# Patient Record
Sex: Male | Born: 1965
Health system: Southern US, Community
[De-identification: ages and names within clinical notes are randomized; demographics above are authoritative.]

## PROBLEM LIST (undated history)

## (undated) DIAGNOSIS — I10 Essential (primary) hypertension: Secondary | ICD-10-CM

## (undated) DIAGNOSIS — K859 Acute pancreatitis without necrosis or infection, unspecified: Secondary | ICD-10-CM

## (undated) DIAGNOSIS — D649 Anemia, unspecified: Secondary | ICD-10-CM

## (undated) HISTORY — DX: Essential (primary) hypertension: I10

---

## 2000-01-29 ENCOUNTER — Inpatient Hospital Stay (HOSPITAL_COMMUNITY): Admission: EM | Admit: 2000-01-29 | Discharge: 2000-01-31 | Payer: Self-pay

## 2000-01-29 ENCOUNTER — Encounter: Payer: Self-pay | Admitting: Family Medicine

## 2004-05-31 ENCOUNTER — Emergency Department (HOSPITAL_COMMUNITY): Admission: EM | Admit: 2004-05-31 | Discharge: 2004-05-31 | Payer: Self-pay | Admitting: Emergency Medicine

## 2005-05-27 ENCOUNTER — Inpatient Hospital Stay (HOSPITAL_COMMUNITY): Admission: EM | Admit: 2005-05-27 | Discharge: 2005-06-02 | Payer: Self-pay | Admitting: Emergency Medicine

## 2005-05-28 ENCOUNTER — Ambulatory Visit: Payer: Self-pay | Admitting: Internal Medicine

## 2005-06-07 ENCOUNTER — Ambulatory Visit: Payer: Self-pay | Admitting: Internal Medicine

## 2006-03-26 ENCOUNTER — Emergency Department (HOSPITAL_COMMUNITY): Admission: EM | Admit: 2006-03-26 | Discharge: 2006-03-27 | Payer: Self-pay | Admitting: Emergency Medicine

## 2006-03-27 ENCOUNTER — Ambulatory Visit: Payer: Self-pay | Admitting: Cardiology

## 2006-03-27 ENCOUNTER — Inpatient Hospital Stay (HOSPITAL_COMMUNITY): Admission: EM | Admit: 2006-03-27 | Discharge: 2006-04-01 | Payer: Self-pay | Admitting: Emergency Medicine

## 2006-04-11 ENCOUNTER — Ambulatory Visit: Payer: Self-pay | Admitting: Gastroenterology

## 2006-06-15 ENCOUNTER — Inpatient Hospital Stay (HOSPITAL_COMMUNITY): Admission: EM | Admit: 2006-06-15 | Discharge: 2006-06-20 | Payer: Self-pay | Admitting: Emergency Medicine

## 2007-10-28 ENCOUNTER — Emergency Department (HOSPITAL_COMMUNITY): Admission: EM | Admit: 2007-10-28 | Discharge: 2007-10-28 | Payer: Self-pay | Admitting: Emergency Medicine

## 2009-07-30 ENCOUNTER — Emergency Department (HOSPITAL_COMMUNITY): Admission: EM | Admit: 2009-07-30 | Discharge: 2009-07-30 | Payer: Self-pay | Admitting: Emergency Medicine

## 2010-11-07 LAB — RAPID STREP SCREEN (MED CTR MEBANE ONLY): Streptococcus, Group A Screen (Direct): NEGATIVE

## 2010-12-08 ENCOUNTER — Emergency Department (HOSPITAL_COMMUNITY): Payer: Self-pay

## 2010-12-08 ENCOUNTER — Inpatient Hospital Stay (HOSPITAL_COMMUNITY)
Admission: EM | Admit: 2010-12-08 | Discharge: 2010-12-14 | DRG: 438 | Disposition: A | Discharge status: Home / Self Care | Payer: Self-pay | Attending: Internal Medicine | Admitting: Internal Medicine

## 2010-12-08 DIAGNOSIS — IMO0001 Reserved for inherently not codable concepts without codable children: Secondary | ICD-10-CM | POA: Diagnosis present

## 2010-12-08 DIAGNOSIS — K831 Obstruction of bile duct: Secondary | ICD-10-CM | POA: Diagnosis present

## 2010-12-08 DIAGNOSIS — R17 Unspecified jaundice: Secondary | ICD-10-CM | POA: Diagnosis present

## 2010-12-08 DIAGNOSIS — R634 Abnormal weight loss: Secondary | ICD-10-CM | POA: Diagnosis present

## 2010-12-08 DIAGNOSIS — R7401 Elevation of levels of liver transaminase levels: Secondary | ICD-10-CM | POA: Diagnosis present

## 2010-12-08 DIAGNOSIS — R978 Other abnormal tumor markers: Secondary | ICD-10-CM | POA: Diagnosis present

## 2010-12-08 DIAGNOSIS — R1013 Epigastric pain: Secondary | ICD-10-CM | POA: Diagnosis present

## 2010-12-08 DIAGNOSIS — R7402 Elevation of levels of lactic acid dehydrogenase (LDH): Secondary | ICD-10-CM | POA: Diagnosis present

## 2010-12-08 DIAGNOSIS — F17200 Nicotine dependence, unspecified, uncomplicated: Secondary | ICD-10-CM | POA: Diagnosis present

## 2010-12-08 DIAGNOSIS — K861 Other chronic pancreatitis: Secondary | ICD-10-CM | POA: Diagnosis present

## 2010-12-08 DIAGNOSIS — K859 Acute pancreatitis without necrosis or infection, unspecified: Principal | ICD-10-CM | POA: Diagnosis present

## 2010-12-08 DIAGNOSIS — R109 Unspecified abdominal pain: Secondary | ICD-10-CM | POA: Diagnosis present

## 2010-12-08 DIAGNOSIS — F1410 Cocaine abuse, uncomplicated: Secondary | ICD-10-CM | POA: Diagnosis present

## 2010-12-08 DIAGNOSIS — K838 Other specified diseases of biliary tract: Secondary | ICD-10-CM | POA: Diagnosis present

## 2010-12-08 DIAGNOSIS — R197 Diarrhea, unspecified: Secondary | ICD-10-CM | POA: Diagnosis present

## 2010-12-08 DIAGNOSIS — K7689 Other specified diseases of liver: Secondary | ICD-10-CM | POA: Diagnosis present

## 2010-12-08 HISTORY — DX: Acute pancreatitis without necrosis or infection, unspecified: K85.90

## 2010-12-08 LAB — COMPREHENSIVE METABOLIC PANEL
BUN: 10 mg/dL (ref 6–23)
CO2: 25 mEq/L (ref 19–32)
Calcium: 9.7 mg/dL (ref 8.4–10.5)
Chloride: 98 mEq/L (ref 96–112)
Creatinine, Ser: 0.58 mg/dL (ref 0.4–1.5)
GFR calc non Af Amer: >60 mL/min (ref >60)
Glucose, Bld: 341 mg/dL — ABNORMAL HIGH (ref 70–99)
Total Bilirubin: 7.3 mg/dL — ABNORMAL HIGH (ref 0.3–1.2)

## 2010-12-08 LAB — URINALYSIS, ROUTINE W REFLEX MICROSCOPIC
Nitrite: NEGATIVE
Protein, ur: NEGATIVE mg/dL
Specific Gravity, Urine: 1.044 — ABNORMAL HIGH (ref 1.005–1.030)
Urobilinogen, UA: 0.2 mg/dL (ref 0.0–1.0)

## 2010-12-08 LAB — CBC
MCH: 28.9 pg (ref 26.0–34.0)
MCHC: 33.4 g/dL (ref 30.0–36.0)
MCV: 86.4 fL (ref 78.0–100.0)
Platelets: 199 10*3/uL (ref 150–400)
RDW: 14.8 % (ref 11.5–15.5)
WBC: 8.7 10*3/uL (ref 4.0–10.5)

## 2010-12-08 LAB — DIFFERENTIAL
Basophils Relative: 1 % (ref 0–1)
Eosinophils Absolute: 0.2 10*3/uL (ref 0.0–0.7)
Lymphs Abs: 1.8 10*3/uL (ref 0.7–4.0)
Monocytes Absolute: 0.4 10*3/uL (ref 0.1–1.0)
Neutro Abs: 6.2 10*3/uL (ref 1.7–7.7)
Neutrophils Relative %: 71 % (ref 43–77)

## 2010-12-08 LAB — GLUCOSE, CAPILLARY

## 2010-12-08 LAB — LIPASE, BLOOD: Lipase: 174 U/L — ABNORMAL HIGH (ref 11–59)

## 2010-12-09 ENCOUNTER — Inpatient Hospital Stay (HOSPITAL_COMMUNITY): Payer: Self-pay

## 2010-12-09 ENCOUNTER — Encounter (HOSPITAL_COMMUNITY): Payer: Self-pay | Admitting: Radiology

## 2010-12-09 LAB — HEMOGLOBIN A1C
Hgb A1c MFr Bld: 14.7 % — ABNORMAL HIGH (ref <5.7)
Mean Plasma Glucose: 375 mg/dL — ABNORMAL HIGH (ref <117)

## 2010-12-09 LAB — PROTIME-INR: Prothrombin Time: 13.1 seconds (ref 11.6–15.2)

## 2010-12-09 LAB — GLUCOSE, CAPILLARY
Glucose-Capillary: 91 mg/dL (ref 70–99)
Glucose-Capillary: 103 mg/dL — ABNORMAL HIGH (ref 70–99)
Glucose-Capillary: 160 mg/dL — ABNORMAL HIGH (ref 70–99)
Glucose-Capillary: 266 mg/dL — ABNORMAL HIGH (ref 70–99)

## 2010-12-09 LAB — LIPASE, BLOOD: Lipase: 115 U/L — ABNORMAL HIGH (ref 11–59)

## 2010-12-09 LAB — LIPID PANEL
Cholesterol: 130 mg/dL (ref 0–200)
LDL Cholesterol: 75 mg/dL (ref 0–99)
Triglycerides: 69 mg/dL (ref <150)

## 2010-12-09 LAB — HEPATIC FUNCTION PANEL
ALT: 139 U/L — ABNORMAL HIGH (ref 0–53)
AST: 135 U/L — ABNORMAL HIGH (ref 0–37)
Albumin: 2.6 g/dL — ABNORMAL LOW (ref 3.5–5.2)

## 2010-12-09 LAB — RAPID URINE DRUG SCREEN, HOSP PERFORMED
Amphetamines: NOT DETECTED
Barbiturates: NOT DETECTED
Benzodiazepines: NOT DETECTED
Opiates: NOT DETECTED

## 2010-12-09 LAB — APTT: aPTT: 35 seconds (ref 24–37)

## 2010-12-09 MED ORDER — IOHEXOL 300 MG/ML  SOLN
100.0000 mL | Freq: Once | INTRAMUSCULAR | Status: AC | PRN
  Administered 2010-12-09: 100 mL via INTRAVENOUS

## 2010-12-10 LAB — COMPREHENSIVE METABOLIC PANEL
AST: 123 U/L — ABNORMAL HIGH (ref 0–37)
Albumin: 2.4 g/dL — ABNORMAL LOW (ref 3.5–5.2)
BUN: 4 mg/dL — ABNORMAL LOW (ref 6–23)
Calcium: 8 mg/dL — ABNORMAL LOW (ref 8.4–10.5)
Creatinine, Ser: <0.47 mg/dL (ref 0.4–1.5)
Total Bilirubin: 4 mg/dL — ABNORMAL HIGH (ref 0.3–1.2)
Total Protein: 4.9 g/dL — ABNORMAL LOW (ref 6.0–8.3)

## 2010-12-10 LAB — GLUCOSE, CAPILLARY
Glucose-Capillary: 262 mg/dL — ABNORMAL HIGH (ref 70–99)
Glucose-Capillary: 140 mg/dL — ABNORMAL HIGH (ref 70–99)

## 2010-12-10 LAB — HEMOGLOBIN A1C: Hgb A1c MFr Bld: 14.3 % — ABNORMAL HIGH (ref <5.7)

## 2010-12-11 LAB — COMPREHENSIVE METABOLIC PANEL
AST: 222 U/L — ABNORMAL HIGH (ref 0–37)
Albumin: 2.6 g/dL — ABNORMAL LOW (ref 3.5–5.2)
Alkaline Phosphatase: 738 U/L — ABNORMAL HIGH (ref 39–117)
BUN: 5 mg/dL — ABNORMAL LOW (ref 6–23)
Potassium: 3.9 mEq/L (ref 3.5–5.1)
Sodium: 136 mEq/L (ref 135–145)
Total Protein: 5.3 g/dL — ABNORMAL LOW (ref 6.0–8.3)

## 2010-12-11 LAB — MAGNESIUM: Magnesium: 1.9 mg/dL (ref 1.5–2.5)

## 2010-12-11 LAB — CBC
Hemoglobin: 11.4 g/dL — ABNORMAL LOW (ref 13.0–17.0)
MCV: 86.4 fL (ref 78.0–100.0)
Platelets: 178 10*3/uL (ref 150–400)
RBC: 3.96 MIL/uL — ABNORMAL LOW (ref 4.22–5.81)
WBC: 6.5 10*3/uL (ref 4.0–10.5)

## 2010-12-11 LAB — GLUCOSE, CAPILLARY: Glucose-Capillary: 282 mg/dL — ABNORMAL HIGH (ref 70–99)

## 2010-12-12 LAB — GLUCOSE, CAPILLARY
Glucose-Capillary: 100 mg/dL — ABNORMAL HIGH (ref 70–99)
Glucose-Capillary: 200 mg/dL — ABNORMAL HIGH (ref 70–99)

## 2010-12-12 LAB — COMPREHENSIVE METABOLIC PANEL
Alkaline Phosphatase: 858 U/L — ABNORMAL HIGH (ref 39–117)
BUN: 5 mg/dL — ABNORMAL LOW (ref 6–23)
Chloride: 105 mEq/L (ref 96–112)
Glucose, Bld: 138 mg/dL — ABNORMAL HIGH (ref 70–99)
Potassium: 3.5 mEq/L (ref 3.5–5.1)
Total Bilirubin: 4.6 mg/dL — ABNORMAL HIGH (ref 0.3–1.2)

## 2010-12-12 LAB — CBC
HCT: 34.4 % — ABNORMAL LOW (ref 39.0–52.0)
MCV: 86.4 fL (ref 78.0–100.0)
RBC: 3.98 MIL/uL — ABNORMAL LOW (ref 4.22–5.81)
WBC: 6.3 10*3/uL (ref 4.0–10.5)

## 2010-12-12 LAB — LIPASE, BLOOD: Lipase: 116 U/L — ABNORMAL HIGH (ref 11–59)

## 2010-12-13 ENCOUNTER — Other Ambulatory Visit: Payer: Self-pay | Admitting: Gastroenterology

## 2010-12-13 ENCOUNTER — Inpatient Hospital Stay (HOSPITAL_COMMUNITY): Payer: Self-pay

## 2010-12-13 LAB — COMPREHENSIVE METABOLIC PANEL
ALT: 225 U/L — ABNORMAL HIGH (ref 0–53)
AST: 201 U/L — ABNORMAL HIGH (ref 0–37)
Alkaline Phosphatase: 890 U/L — ABNORMAL HIGH (ref 39–117)
CO2: 26 mEq/L (ref 19–32)
Glucose, Bld: 272 mg/dL — ABNORMAL HIGH (ref 70–99)
Potassium: 3.7 mEq/L (ref 3.5–5.1)
Sodium: 135 mEq/L (ref 135–145)
Total Protein: 5.1 g/dL — ABNORMAL LOW (ref 6.0–8.3)

## 2010-12-13 LAB — CBC
HCT: 33.2 % — ABNORMAL LOW (ref 39.0–52.0)
Hemoglobin: 10.9 g/dL — ABNORMAL LOW (ref 13.0–17.0)
RDW: 15.5 % (ref 11.5–15.5)
WBC: 6.1 10*3/uL (ref 4.0–10.5)

## 2010-12-13 LAB — GLUCOSE, CAPILLARY: Glucose-Capillary: 130 mg/dL — ABNORMAL HIGH (ref 70–99)

## 2010-12-13 LAB — LIPASE, BLOOD: Lipase: 100 U/L — ABNORMAL HIGH (ref 11–59)

## 2010-12-14 LAB — COMPREHENSIVE METABOLIC PANEL
ALT: 173 U/L — ABNORMAL HIGH (ref 0–53)
BUN: 4 mg/dL — ABNORMAL LOW (ref 6–23)
Calcium: 8.3 mg/dL — ABNORMAL LOW (ref 8.4–10.5)
Creatinine, Ser: <0.47 mg/dL (ref 0.4–1.5)
Glucose, Bld: 227 mg/dL — ABNORMAL HIGH (ref 70–99)
Sodium: 133 mEq/L — ABNORMAL LOW (ref 135–145)
Total Bilirubin: 2.6 mg/dL — ABNORMAL HIGH (ref 0.3–1.2)
Total Protein: 5.2 g/dL — ABNORMAL LOW (ref 6.0–8.3)

## 2010-12-14 LAB — CBC
HCT: 35.1 % — ABNORMAL LOW (ref 39.0–52.0)
RBC: 4 MIL/uL — ABNORMAL LOW (ref 4.22–5.81)
RDW: 15.2 % (ref 11.5–15.5)
WBC: 8.3 10*3/uL (ref 4.0–10.5)

## 2010-12-14 LAB — GLUCOSE, CAPILLARY
Glucose-Capillary: 232 mg/dL — ABNORMAL HIGH (ref 70–99)
Glucose-Capillary: 228 mg/dL — ABNORMAL HIGH (ref 70–99)

## 2010-12-17 LAB — TYPE AND SCREEN
DAT, IgG: NEGATIVE
Donor AG Type: NEGATIVE
Donor AG Type: NEGATIVE
Unit division: 0

## 2010-12-19 NOTE — Group Therapy Note (Signed)
NAME:  Rodney Barber, Rodney Barber            ACCOUNT NO.:  [id number]  MEDICAL RECORD NO.:  [id number]           PATIENT TYPE:  I  LOCATION:  1503                         FACILITY:  [facility]  PHYSICIAN:  Kela Millin, M.D.DATE OF BIRTH:  December 03, 1965                                PROGRESS NOTE   CURRENT DIAGNOSES: 1. Acute-on-chronic pancreatitis. 2. Common bile duct strictures - status post ERCP today with     sphincterotomy x2 and stent placement per Dr. Ewing Schlein,     biopsies/pathology results pending. 3. Elevated liver function tests. 4. Elevated CA-19-9 - per GI, the patient to have followup recheck     test when LFTs have improved. 5. Left hepatic lobe lesion with increased intrahepatic and     extrahepatic biliary dilatation - per CT scan of abdomen and     pelvis. 6. Type 2 diabetes mellitus, uncontrolled - new onset.  PROCEDURES AND STUDIES: 1. Chest x-ray on May 3rd - no active lung disease. 2. Abdominal x-rays on May 3rd - gaseous distention of small bowel.     Cannot exclude partial small bowel.  Moderate amount of feces     throughout the colon. 3. Abdominal ultrasound on May 3rd - biliary sludge in the gallbladder     and the common bile duct with associated dilatation of the common     bile duct to 11 mm.  Correlation suggested with liver function     tests.  Evidence of chronic pancreatitis.  Somewhat  echogenic     kidneys. 4. CT scan of abdomen and pelvis on May 4th - newly apparent atrophy,     enhancing lesion in the lateral segment of the left hepatic lobe.     Dysplastic nodule/filling of a hemangioma or focal nodular     hyperplasia, but hepatic cellular carcinoma not excluded.     Increased intrahepatic and extrahepatic biliary dilatation.  There     are dorsal pancreatic duct stones as well as a potential stone in     the ampulla.  Increased dilatation of the dorsal pancreatic duct.     Small suspected pseudocysts adjacent to the pancreatic head.     Diffuse  mesenteric edema - question active pancreatitis.     Nonspecific hypodense lesion in the lateral limb of the left     adrenal gland.  Atherosclerosis.  Mild wall thickening in the     ascending colon which could be from inflammation or possibly     hypoproteinemia. 5. ERCP with sphincterotomy x2 per Dr. Ewing Schlein today, May 8th, - 3-4 cm     common bile duct strictures, status post brushing and stent     placement, sphincterotomy x2.  To follow up on brushings/pathology     and if negative endoscopic ultrasound next week per pathology.  CONSULTATIONS: 1. Gastroenterology - Willis Modena, MD/Marc Shanon Ace, MD 2. General Surgery - Ardeth Sportsman, MD/Bartlett H. Ezzard Standing, MD.  BRIEF HISTORY: The patient is a 45 year old black male with the above-listed medical problems who presented with complaints of abdominal pain and diarrhea for 3 weeks.  It was noted that he does have a history of  chronic pancreatitis and reported mild-to-moderate right upper quadrant and epigastric pain for about 3 weeks, associated with loose bowel movements.  He denied any nausea or vomiting.  He also reported about 15- pound weight loss in 4 weeks.  He describes his abdominal pain as mild to moderate and stated that his p.o. intake was decreased and that he was avoiding solids because of the pain and just managing liquids prior to admission.  He was seen in the ED and admitted for further evaluation and management.  HOSPITAL COURSE: 1. Acute-on-chronic pancreatitis - upon admission, his lab work     revealed a lipase of 1115 and amylase of 168.  He was kept n.p.o.     and hydrated with IV fluids and started on IV analgesics for pain     management.  Abdominal ultrasound was done and the results as     stated above as well as a CT scan of the abdomen and pelvis.     Following the abnormal CT scan, Gastroenterology was consulted as     well as General Surgery.  Dr. Dulce Sellar saw the patient and     recommended to  continue conservative management and he added     pancreatic enzymes in the patient's treatment regimen.  He also     obtained a CA-19-9 level and this came back markedly elevated at     22,039.  Dr. Dulce Sellar recommended doing an ERCP and Dr. Ewing Schlein     followed up with the patient and the ERCP was done today and     revealed common bile duct strictures and sphincterotomies x2 was     done and also a stent was placed.  The patient is to be observed     today for late complications and GI to follow for further     recommendations.  His lipase on recheck today is 100. 2. Common bile duct strictures - as discussed above, the patient had a     CT scan of the abdomen and pelvis on admission and the results are     as stated above.  A CEA level was done and came back at 4.5.  GI     was consulted and an ERCP was done today that revealed the common     bile duct strictures and sphincterotomies done x2 and a stent     placed.  LFTs/c-MET to follow up in the EMS.  Brushings/pathology     was sent following the ERCP and the results are pending at the time     of this dictation, per GI if these results are negative endoscopic     ultrasound will be planned for next week for further evaluation. 3. Elevated CA-19-9 - the patient had a CT scan of his abdomen and     pelvis which was abnormal, as stated above, and GI was consulted     and they saw the patient and CA-19-9 was ordered and came back     elevated at 22,039.  As stated above, ERCP was done today and the     pathology is pending and plan is to follow up and have endoscopic     ultrasound done if it comes back negative.  Per GI recommendations,     the patient is also to have repeat CA-19-9 once his LFTs decrease. 4. Elevated LFTs - as discussed above, the patient's LFTs on admission     showed a total bilirubin of 7.3, alkaline phosphatase of 1051,  SGOT     of 202, SGPT of 226 and abdominal ultrasound as well as CT scan of     abdomen and pelvis  were done and the results are as stated above.     GI and Surgery were consulted and have been following the patient.     As already mentioned, the ERCP revealed common bile duct strictures     and pathology is pending at this time and repeat c-MET is pending     for in the morning. 5. Diabetes mellitus - on admission, the patient was noted to have     elevated blood sugars of 341 without a prior history of diabetes     mellitus.  Hemoglobin A1c was done and came back elevated at 14.3.     The patient was started on Lantus during this hospital stay and     inpatient diabetes education done and the patient is also to be     scheduled for outpatient diabetes education.  He is currently on     Lantus with sliding scale coverage at this time. 6. Left hepatic lobe lesion - imaging studies as stated above, and     also the serologies with a CEA of 4.5 as above, per GI.  The rounding hospitalist to dictate the rest of the patient's hospital stay as well as final discharge medications and followup care.     Kela Millin, M.D.     ACV/MEDQ  D:  12/13/2010  T:  12/13/2010  Job:  [id number]  Electronically Signed by Donnalee Curry M.D. on 12/19/2010 12:13:10 PM

## 2010-12-20 NOTE — Consult Note (Signed)
NAME:  Rodney Barber, Rodney Barber            ACCOUNT NO.:  [id number]  MEDICAL RECORD NO.:  [id number]           PATIENT TYPE:  I  LOCATION:  1503                         FACILITY:  [facility]  PHYSICIAN:  Ardeth Sportsman, MD     DATE OF BIRTH:  September 24, 1965  DATE OF CONSULTATION:  12/09/2010 DATE OF DISCHARGE:                                CONSULTATION   REFERRING PHYSICIAN:  Dr. Suanne Marker.  PRIMARY CARE PHYSICIAN:  None.  REASON FOR CONSULTATION:  Sludge in the gallbladder, abdominal pain, and diarrhea.  BRIEF HISTORY:  The patient is a 45 year old African American male who has a history of recurrent pancreatitis who gave up alcohol 5 years ago. He reports abdominal pain over the last 3 weeks.  Pain is in the right upper quadrant.  He has also had 15-pound weight loss in the last 4 weeks.  He has not been trying to lose weight.  He has been eating at home.  He has been taking over-the-counter pain medications without resolution.  He ultimately presented to the ER here at [facility] and was admitted by the hospitalist service on Dec 08, 2010. Workup here shows elevated LFTs.  On admission, his lipase was 174. Alcohol was less than 11, normal.  Magnesium was 2.0.  Total bilirubin was 7.3, alkaline phosphatase was 1051, SGOT was 202, and SGPT was 226. Urine showed greater than 1000 mg glucose.  White count 8.7, hemoglobin 14, hematocrit 42, and platelets 199,000.  Prothrombin time was 13.1, INR was 0.97, and PTT was 35.  Drug screen was negative.  Total protein was 7 and albumin was 4.  Repeat labs today, shows the bilirubin down to 5.7, direct bilirubin 4.8, and indirect is 0.9.  Alkaline phosphatase 690, SGOT 135, and SGPT 139.  Amylase is 168 and lipase is 115.  Lipid profile shows total cholesterol of 130, triglyceride is 69, HDL 41, and LDL is 75.  Hemoglobin A1c is 14.7.  Flat plate abdomen on admission showed some gaseous distention in the small bowel and moderate amount of feces  in the colon.  Abdominal ultrasound showed the gallbladder contained sludge and was moderately distended.  There was no gallbladder wall thickening.  Murphy sign was negative.  Common bile duct is dilated and measures up to 11 mm.  There is visible biliary sludge present in the common distal bile duct.  Liver showed no evidence of mass or significant intrahepatic biliary dilatation.  The IVC was patent.  The pancreas was visualized, which showed punctate calcifications consistent with chronic pancreatitis.  Spleen was normal.  Kidneys were normal. Abdominal aorta was normal.  CT scan obtained today at 2 o'clock, shows the lateral left lobe has a new area that could be hemangioma, hyperplasia, or small hepatocellular carcinoma.  There was increased intrahepatic and extrahepatic biliary dilatation.  The dorsal pancreatic duct stones are present and there are also stones in the ampulla.  There is increased dilatation of the dorsal pancreatic duct.  There is a question of a small pseudocyst adjacent to the pancreatic head.  There is diffuse mesenteric edema.  There is a hypodense lesion, left adrenal. We  are asked to see the patient in consultation.  PAST MEDICAL HISTORY: 1. Chronic pancreatitis.  Last hospitalization, August 05, 2006.  At     that time, he had a retroperitoneal bleed. 2. He has a history of alcohol use, tobacco, and cocaine use.  PAST SURGICAL HISTORY:  None.  FAMILY HISTORY:  Father died of a stroke and hypertension.  Mother is living with diabetes, has 3 brothers, 1 died in accident and 1 sister in good health.  The remaining 2 brothers are in good health as far as he knows.  SOCIAL HISTORY:  He smoked 25 to 30 years, less than a pack a day.  He currently smokes about 3 packs per week.  Alcohol, he reports heavy use for 20 or more years.  He quit 5 years ago after his last episode of pancreatitis.  Drugs:  He used cocaine for number of years.  He denies any use for  the last 2 years.  He lives at home with his mother.  He is unemployed currently.  REVIEW OF SYSTEMS:  FEVER:  None.  WEIGHT:  15-pound weight loss.  He also complains of generalized weakness.  He reports has been eating normally.  SKIN:  No changes.  PSYCHIATRIC:  No new changes.  PULMONARY: No dyspnea on exertion, orthopnea, or PND.  No coughing, no wheezing, no recent URI.  CARDIAC:  No chest pain, palpitations, or history of MI. CV:  Negative for headaches, dizziness, syncope, presyncope, stroke, or seizure.  GI:  Positive for GERD.  No nausea or vomiting.  Positive for diarrhea.  No constipation.  No blood in the stool.  GU:  He notes he is peeing normally, but the urine is very dark.  LOWER EXTREMITIES:  No edema or claudication.  MUSCULOSKELETAL:  No joint or hip changes.  CURRENT MEDICATIONS:  No prescription medications.  He has been going to the doctor where he get pain medications.  ALLERGIES:  None known.  PHYSICAL EXAMINATION:  GENERAL:  This is a thin cachectic African American male in no acute distress.  Currently, he is pain free. VITAL SIGNS:  Last vital signs shows temperature 98.3, heart rate 54, respiratory rate 16, saturation is 99%, and blood pressure 110/77. HEAD:  Normocephalic. EYES, EARS, NOSE, AND THROAT:  Normal. NECK:  Trachea is in the midline.  No bruits.  No JVD.  Thyroid is nonpalpable.  No bruits. CARDIAC:  Normal S1 and S2.  No murmurs or rubs.  Pulses are +2 and equal. CHEST:  Nontender. ABDOMEN:  Positive for bowel sounds.  Abdomen looks mildly distended, but it is nontender.  The liver is percussed at about 1.5 to 2 cm below the costal margin.  No masses, abscesses, or hernias noted. GU/RECTAL:  Deferred. LYMPHATIC:  Lymphadenopathy, none palpated. MUSCULOSKELETAL:  No joint abnormalities. SKIN:  No changes. NEUROLOGIC:  Cranial nerves are grossly intact.  No focal deficits. PSYCHIATRIC:  Normal affect.  IMPRESSION: 1. Recurrent  pancreatitis with new weight loss, off alcohol. 2. Newly distended gallbladder with sludge, not present on ultrasound     of June 15, 2006. 3. Biliary dilatation, intrahepatic with sludge in the common bile     duct.  There is also pancreatic stones noted. 4. A 1.4 cm lesion, left hepatic lobe.  Differential currently being     nodular hyperplasia, hemangioma, or possible carcinoma. 5. Mesenteric edema secondary to be probable pancreatitis. 6. Left adrenal lesion, nonspecific. 7. New diagnosis of diabetes with hemoglobin A1c of 14.7.  PLAN:  The patient has been seen by GI.  They are screening for diarrhea, checking CEA, following his LFTs.  Note that he may need an ERCP and an EUS.  There is also question of possible MRCP.  Dr. Michaell Cowing will review and see the patient shortly.     Eber Hong, P.A.   ______________________________ Ardeth Sportsman, MD    WDJ/MEDQ  D:  12/09/2010  T:  12/09/2010  Job:  [id number]  cc:   Eber Hong, P.A.  Willis Modena, MD Fax: [phone]  Electronically Signed by Sherrie George P.A. on 12/20/2010 11:18:08 AM Electronically Signed by Karie Soda MD on 12/20/2010 12:23:08 PM

## 2010-12-21 ENCOUNTER — Ambulatory Visit (HOSPITAL_COMMUNITY)
Admission: RE | Admit: 2010-12-21 | Discharge: 2010-12-21 | Disposition: A | Discharge status: Home / Self Care | Payer: Self-pay | Source: Ambulatory Visit | Attending: Gastroenterology | Admitting: Gastroenterology

## 2010-12-21 ENCOUNTER — Other Ambulatory Visit: Payer: Self-pay | Admitting: Gastroenterology

## 2010-12-21 DIAGNOSIS — K831 Obstruction of bile duct: Secondary | ICD-10-CM | POA: Insufficient documentation

## 2010-12-21 DIAGNOSIS — K861 Other chronic pancreatitis: Secondary | ICD-10-CM | POA: Insufficient documentation

## 2010-12-21 DIAGNOSIS — Z01812 Encounter for preprocedural laboratory examination: Secondary | ICD-10-CM | POA: Insufficient documentation

## 2010-12-21 DIAGNOSIS — E119 Type 2 diabetes mellitus without complications: Secondary | ICD-10-CM | POA: Insufficient documentation

## 2010-12-21 LAB — CBC
MCH: 29.1 pg (ref 26.0–34.0)
MCHC: 32.9 g/dL (ref 30.0–36.0)
Platelets: 296 10*3/uL (ref 150–400)

## 2010-12-21 LAB — COMPREHENSIVE METABOLIC PANEL
AST: 20 U/L (ref 0–37)
Albumin: 3.7 g/dL (ref 3.5–5.2)
Calcium: 8.8 mg/dL (ref 8.4–10.5)
Creatinine, Ser: 0.57 mg/dL (ref 0.4–1.5)
GFR calc Af Amer: >60 mL/min (ref >60)
GFR calc non Af Amer: >60 mL/min (ref >60)

## 2010-12-22 LAB — CANCER ANTIGEN 19-9: CA 19-9: 13341.8 U/mL — ABNORMAL HIGH (ref <35.0)

## 2010-12-22 NOTE — Discharge Summary (Signed)
NAME:  Rodney Barber, Rodney Barber            ACCOUNT NO.:  [id number]  MEDICAL RECORD NO.:  [id number]           PATIENT TYPE:  I  LOCATION:  1503                         FACILITY:  [facility]  PHYSICIAN:  Thad Ranger, MD       DATE OF BIRTH:  [birth year]  DATE OF ADMISSION:  12/08/2010 DATE OF DISCHARGE:                        DISCHARGE SUMMARY - REFERRING   DISCHARGE DIAGNOSES: 1. Acute on chronic pancreatitis. 2. Common bile duct stricture status post endoscopic retrograde     cholangiopancreatography with sphincterotomy x2 and stent     placement.  Biopsies pending. 3. Transaminitis. 4. Elevated CA19-9. 5. Left hepatic lobe lesion with increased intrahepatic and     extrahepatic biliary dilatation. 6. Type 2 diabetes mellitus, new onset, uncontrolled.  CONSULTATIONS:  Gastroenterology, Dr. Vida Rigger and Dr. Herbie Drape. General Surgery, Dr. Karie Soda.  DISCHARGE MEDICATIONS: 1. Creon AC 3 capsules p.o. t.i.d. before meals. 2. Norco 5/325 mg tabs 1 tablet p.o. every 4 hours as needed for pain. 3. Insulin NovoLog 1 to 9 units subcutaneously t.i.d. with meals     sliding scale. 4. Lantus 10 units subcutaneous daily at bedtime. 5. Protonix 40 mg p.o. daily. 6. Tramadol 50 mg p.o. every 4 hours as needed for pain.  Please note that the progress note for the hospitalization course was dictated by Dr. Suanne Marker yesterday on Dec 13, 2010.  Please refer to the progress note for complete details of the hospitalization; briefly: 1. Acute on chronic pancreatitis with CBD strictures status post ERCP     with sphincterotomy, the biopsies are still pending.  The patient     has been started on diet and has been tolerating well since a.m.     today.  Per GI and Surgery recommendation if the patient is able to     tolerate lunch today he can be safely discharged to home.  The     patient will follow up with Dr. Vida Rigger in office within next 1     week for the biopsy results and further workup  outpatient.  The     patient was also cleared by General Surgery for discharge if he     tolerates diet. 2. Elevated CA19-9.  As dictated above ERCP has been done.  Pathology     is still pending and the patient is to follow up with     Gastroenterology for further workup. 3. Diabetes mellitus, new onset.  HbA1c was elevated at 14.3.  The     patient was given extensive diabetic education and was started on     Lantus and sliding scale insulin while inpatient.  The patient will     be discharged to home today.  Discharge followup with [facility] Dr. Andrey Campanile on February 02, 2011, at 2:15 p.m.; Dr. Vida Rigger, the patient is to call for biopsy results and followup appointment within next 1 week.  This was clearly explained to the patient in great detail; Surgery, Dr. Karie Soda, within next 2 to 4 weeks.  PHYSICAL EXAMINATION:  VITAL SIGNS:  Temperature 98.5, pulse 53, respirations 16, BP 145/82, O2 sats 95% on room air. GENERAL:  The  patient is alert, awake and oriented x3, not in acute distress. HEENT:  Anicteric sclerae.  Pink conjunctivae.  Scleral icterus noted. CVS:  S1, S2 clear. ABDOMEN:  Soft, normal bowel sounds. EXTREMITIES:  No pedal edema. NEUROLOGICAL:  Alert and oriented x3.  No focal neurological deficits noted.Marland Kitchen  Discharge time, 35 minutes.     Thad Ranger, MD     RR/MEDQ  D:  12/14/2010  T:  12/14/2010  Job:  [id number]  cc:   Petra Kuba, M.D. Fax: [phone]  Ardeth Sportsman, MD [address] [place] [address]  Maurice March, M.D. Fax: [phone]  Electronically Signed by Freeda Spivey  on 12/22/2010 01:27:16 PM

## 2010-12-23 NOTE — Discharge Summary (Signed)
NAME:  Rodney Barber, Rodney Barber NO.:  [id number]   MEDICAL RECORD NO.:  [id number]          PATIENT TYPE:  INP   LOCATION:  1430                         [facility]AC[facility]   PHYSICIAN:  Michaelyn Barter, M.D. DATE OF BIRTH:  August 04, 1966   DATE OF ADMISSION:  06/15/2006  DATE OF DISCHARGE:  06/20/2006                               DISCHARGE SUMMARY   PRIMARY CARE PHYSICIAN:  Unassigned.   FINAL DIAGNOSES:  1. Acute on chronic pancreatitis.  2. Acute blood loss anemia.  3. Intra-abdominal hematoma.   CONSULTATIONS:  General surgery with Dr. Zachery Dakins.   PROCEDURES:  1. Transfusion of 2 units of packed RBCs.  2. Ultrasound of the patient's abdomen completed November9,2007.  3. CT scan of the abdomen with and without contrast November9,2007.  4. Angiogram of the patient's abdomen on November10,2007 done by Dr.      Charlett Nose.  5. CT scan of the patient's abdomen with and without contrast      completed November13,2007.   HISTORY OF PRESENT ILLNESS:  Rodney Barber is a 45 year old gentleman  with a past medical history of recurrent acute pancreatitis who  complained of a 2-day history of abdominal pain, nausea and vomiting.  His abdominal pain was located primarily in the epigastrium.  It  radiated to the lower abdomen and to his back.  The pain initially was  intermittent but became constant.  It was associated with nausea and  vomiting.  He complained of at least 1 episode of coffee-ground emesis.   PAST MEDICAL HISTORY:  Please see that dictated by Dr. Elliot Cousin on  November9,2007.   HOSPITAL COURSE:  #1 - ACUTE ON CHRONIC PANCREATITIS:  The patient's  lipase at the time of admission was found to be 1192.  He was started on  p.r.n. pain medications and over the course of his hospitalization his  abdominal pain resolved.  His lipase, by the following day, November10,  was 265.  On that same date, an amylase was completed, and it was found  to be 342.  An  ultrasound of the patient's abdomen was done on  November9.  It revealed heterogeneous retroperitoneal mass adjacent to  the pancreatic head, the etiology of which was questionable.  The  radiologist indicated that he was concerned that it could represent a  solid tumor mass and recommended that a CAT scan.  CT scan of the  patient's abdomen was done with and without contrast later on November9.  It revealed that the retroperitoneal mass seen on the recent ultrasound  represented a hematoma centered in the region of the duodenum and the  pancreatic head.  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  The radiologist  recommended that a mesenteric arteriogram be considered.  On November10,  a celiac arteriogram with superior mesenteric arteriogram was completed.  The final impression from that study was that there may be filling of  the tiny pseudoaneurysm on the SMA injection filling from very  tiny/wispy vessels from the pancreaticoduodenal arcade  was evident.  Even if this does represent a small pseudoaneurysm, it could not be  treated percutaneously due to his filling from very small and poorly  defined vessels.  General surgery was consulted.  Dr. Zachery Dakins followed  the patient over  his hospitalization.  He recommended that close  observation of the patient's hemoglobin and hematocrit take place.  He  also monitored the patient closely for any signs of additional blood  loss and for any progression of his abdominal pain.  The patient,  however, remained stable over the next few days of his hospitalization.  Therefore, no surgical intervention was needed.  A repeat CAT scan was  done on November13.  It revealed that the hematoma entered along the  posterolateral wall of the descending duodenum, is minimally smaller on  followup CT scan.  Pseudoaneurysm off  the pancreaticoduodenal arcade is  no longer appreciated.  The pancreatic ductal dilation likely secondary  to pancreatic duct stones within the head of the uncinate process was  seen.  The patient's abdominal pain had completely resolved by the date  of his discharge.  1. Anemia.  This was related to the hematoma that was seen on CAT      scan.  The patient arrived with a hemoglobin of 13.3 on November9;      however, by November11, the patient's hemoglobin drifted down to      8.8.  This was an acute loss of blood.  The patient received 2      units of packed RBCs.  His hemoglobin eventually stabilized during      the course of his hospitalization.  2. Hematoma.  Again, this was depicted on CAT scan, and it was found      to be located in a region of the duodenum and the pancreatic head      and was felt to possibly have been associated with pseudoaneurysm.      No surgical intervention was necessary during this hospitalization,      as the patient's hemoglobin stabilized, and his vitals remained      stable.   CONDITION AT THE TIME OF DISCHARGE:  On the date of discharge, the  patient stated that his abdominal pain had completely resolved.  He had  no abdominal pain.  His vitals:  His temperature was 98.7, heart rate  68, respirations 18.  The decision was made to discharge the patient  home.  The patient was discharged home on Protonix 40 mg daily and  lisinopril 10 mg once a day.  He was told to follow up with [facility],  to stop drinking alcohol, and to stop smoking cigarettes.      Michaelyn Barter, M.D.  Electronically Signed     OR/MEDQ  D:  07/09/2006  T:  07/10/2006  Job:  [id number]

## 2010-12-23 NOTE — H&P (Signed)
NAME:  Rodney Barber, Rodney Barber NO.:  [id number]   MEDICAL RECORD NO.:  [id number]          PATIENT TYPE:  INP   LOCATION:  1404                         FACILITY:  [facility]   PHYSICIAN:  Isidor Holts, M.D.  DATE OF BIRTH:  1965-08-22   DATE OF ADMISSION:  05/27/2005  DATE OF DISCHARGE:                                HISTORY & PHYSICAL   PRIMARY CARE PHYSICIAN:  Unassigned.   CHIEF COMPLAINT:  Abdominal pain, poor appetite for eight days.   HISTORY OF PRESENT ILLNESS:  This is a 45 year old male, with known history  of alcohol abuse. He drank 40 ounces of malt liquor on May 19, 2005. He  subsequently, on May 22, 2005, developed upper abdominal pain. This was  described as severe, constant and exacerbated by eating. He vomited for two  days thereafter. He has not been able to eat since and estimates that he has  lost about 20 pounds since his pain started. Denies fever, denies diarrhea,  denies cough, denies shortness of breath, denies dysuria.   PAST MEDICAL HISTORY:  1.  Alcohol-induced acute pancreatitis June 2001.  2.  Alcohol abuse.  3.  Remote history of polysubstance abuse.  4.  Hypertension.   MEDICATIONS:  None regularly.   ALLERGIES:  No known drug allergies.   REVIEW OF SYSTEMS:  As per HPI and chief complaint. Otherwise negative.   SOCIAL/FAMILY HISTORY:  The patient is single. He works in [employer]rap[employer]. Has no  offsprings. Smokes about one-third of a pack of cigarettes per day since age  of 15 years. Drinks about two 24 ounce amounts of liquor on weekends. Denies  drug abuse. He has four brothers, two of whom drink a lot of alcohol. He has  one sister who is alive and well. His mother has diabetes mellitus and  hypertension. His father died at the age of 25 years status post MI.   PHYSICAL EXAMINATION:  VITAL SIGNS:  Temperature 99.4, pulse 86 per minute  and regular, respiratory rate 16, BP 126/84 mmHg, pulse oximeter 96% on room  air.  GENERAL:   The patient does not appear to be in obvious acute distress,  following administration of analgesics in the emergency department. He is  alert, oriented, communicative, not short of breath at rest.  HEENT:  Tinge of jaundice. No clinical pallor. No conjunctival injection.  Throat appears quite clear.  NECK:  Supple. JVP not seen. No palpable lymphadenopathy. No palpable  goiter.  CHEST:  Clinically clear to auscultation. No wheezes and no crackles.  HEART:  S1 and S2 heard. Normal, regular and no murmurs.  ABDOMEN:  Soft. The patient has mild epigastric/right upper quadrant  tenderness. There is no palpable organomegaly. Normal bowel sounds. No  guarding, no rebound tenderness.  EXTREMITIES:  Lower extremity examination shows no pitting edema. Palpable  peripheral pulses.  MUSCULOSKELETAL:  Quite unremarkable.  CENTRAL NERVOUS SYSTEM:  No focal neurologic deficits on gross examination.   INVESTIGATIONS:  CBC shows WBC 16.2, neutrophils 86%, hemoglobin 14.9,  hematocrit 43.2, platelets 235,000. Electrolytes show sodium 132, potassium  3.6, chloride 93, CO2 27, BUN 9, creatinine  0.9, glucose 113. AST 81, ALT  89, alkaline phosphate 239. Total bilirubin 4.9. Lipase level 810. INR 1.1.  Acetaminophen level less than 10.7. Urinalysis is negative. Abdominal  ultrasound dated May 27, 2005 shows sludge in the gallbladder. No stones  or wall thickening. Pancreatic duct is dilated. There is calcification in  the pancreatic head. Pancreatic head is enlarged. Liver is edematous.   ASSESSMENT/PLAN:  1.  Acute pancreatitis, recurrent:  Likely secondary to alcohol, but for      completeness we will check lipid profile to rule out      hypertriglyceridemia. We shall admit the patient to telemetry floor for      close monitoring, keep him n.p.o. Manage with intravenous fluids,      analgesics, and antiemetics. Follow amylase and lipase levels. We will      also do an abdominal CT scan in order to  delineate pancreatic anatomy      and evaluate liver for possible cirrhosis.   1.  Hepatitis:  Likely alcoholic.  For completeness, do a viral hepatitis      screen and serum ferritin levels. Meanwhile, continue with thiamine and      multivitamin supplements.   1.  Alcohol abuse:  Watch for withdrawal phenomena and utilize Ativan      protocol if this occurs. The patient will need counseling and this has      been arranged.   1.  Smoker:  We will utilize Nicoderm CQ patch and also counsel      appropriately.   1.  History of hypertension:  The patient is not currently on specific      treatment for this, however, he appears normotensive at present. We will      monitor closely and treat if indicated.   1.  Remote history of polysubstance abuse:  We shall check urine drug      screen.  Further management will depend on clinical course.      Isidor Holts, M.D.  Electronically Signed     CO/MEDQ  D:  05/27/2005  T:  05/27/2005  Job:  [id number]

## 2010-12-23 NOTE — H&P (Signed)
NAME:  Rodney Barber, Rodney Barber NO.:  [id number]   MEDICAL RECORD NO.:  [id number]          PATIENT TYPE:  INP   LOCATION:  0103                         FACILITY:  [facility]   PHYSICIAN:  Elliot Cousin, M.D.    DATE OF BIRTH:  Oct 14, 1965   DATE OF ADMISSION:  06/15/2006  DATE OF DISCHARGE:                                HISTORY & PHYSICAL   PRIMARY CARE PHYSICIAN:  The patient is unassigned.   CHIEF COMPLAINT:  Abdominal pain, nausea, and vomiting.   HISTORY OF PRESENT ILLNESS:  The patient is a 45 year old man with a past  medical history significant for recurrent acute pancreatitis, chronic  pancreatitis, and hypertension, who presents to the emergency department  with a two-day history of abdominal pain, nausea, and vomiting.  The  abdominal pain is located primarily in the epigastrium.  It radiates to the  lower abdomen and to the back.  Initially, the pain was intermittent.  However, over the past 24 hours it has been constant.  He rates the pain as  severe in intensity.  It is a gnawing and aching pain.  It is associated  with nausea.  He did have two episodes of vomiting yesterday.  One episode  yielded a small amount of coffee-ground emesis.  He denies associated  diarrhea or constipation.  No pain with urination.  He also denies any  recent illicit drug use or alcohol use.  During the previous hospitalization  in August 2007, he was referred to gastroenterologist Dr. Cira Servant at  [facility].  However, the patient says he  was told that someone from [place] would call him.  He waited for  the call but did not receive it.   During the evaluation in the emergency department, the patient is found to  be hemodynamically stable and afebrile.  His white blood cell count is  marginally elevated at 11.3.  His lipase is markedly elevated at 1192.  The  patient will be admitted for further evaluation and management.   PAST  MEDICAL HISTORY:  1. Recurrent acute pancreatitis.  The pancreatitis in the past was      associated with acute alcohol ingestion.  The hospitalization in August      2007 was not associated with alcohol-induced pancreatitis.  2. Chronic pancreatitis, with evidence of pancreatic head calculi and      pancreatic ductal stricture per CT scan of the abdomen in August 2007.  3. Hypertension.  4. Transient second-degree heart block in August 2007.  The patient was      evaluated by Dr. Antoine Poche.  Conservative management was recommended, as      the patient's heart block resolved during the hospital course.  5. History of cocaine abuse.  6. History of alcohol abuse.   ALLERGIES:  No known drug allergies.   MEDICATIONS:  1. Lisinopril 10 mg daily (the patient admits not being compliant daily).  2. Over-the-counter Advil p.r.n.   SOCIAL HISTORY:  The patient is single.  He has no children.  He is  currently unemployed.  He smokes 1/2 pack of cigarettes  per day.  He stopped  drinking alcohol approximately 6 months ago.  He stopped using cocaine  approximately 2 months ago.  He graduated from high school.  He can read and  write.  He is receiving financial help/assistance from his family.   FAMILY HISTORY:  His mother is 40 years of age and has diabetes mellitus and  hypertension.  His father died of a heart attack.   REVIEW OF SYSTEMS:  The patient's review of systems is positive for  abdominal pain, nausea, and vomiting.  Otherwise negative.   PHYSICAL EXAMINATION:  VITAL SIGNS:  Temperature 97.4, blood pressure  142/88, pulse 97, respiratory rate 20, oxygen saturation 96% on room air.  GENERAL:  The patient is a pleasant 45 year old African American man who is  currently lying in bed in no acute distress (status post Reglan and  Dilaudid).  HEENT:  Head is normocephalic, nontraumatic.  Pupils equal, round, and  reactive to light.  Extraocular movements are intact.  Conjunctivae are   clear.  Sclerae are white.  Oropharynx reveals dry mucous membranes.  No  posterior exudates or erythema.  Dentition is fair.  LUNGS:  Clear to auscultation bilaterally.  HEART:  S1, S2, with no murmurs, rubs, or gallops.  ABDOMEN:  Hypoactive bowel sounds.  Soft.  Moderately tender in the  epigastrium and periumbilically.  Mild voluntary guarding.  No rebound, no  rigidity.  No hepatosplenomegaly.  RECTAL/GU:  Deferred.  EXTREMITIES:  Pedal pulses are 2+ bilaterally.  No pretibial edema.  No  pedal edema.  NEUROLOGIC:  The patient is alert and oriented x3.  Cranial nerves II-XII  are intact.  Strength is 5/5 throughout.  Sensation is intact.   ADMISSION LABORATORIES:  EKG reveals normal sinus rhythm, with a heart rate  of 95 beats per minute, and nonspecific ST and T wave abnormalities.  Lipase  1192, total protein 6.7, albumin 4.1, AST 22, ALT 15, alkaline phosphatase  93, total bilirubin 1.1, direct bilirubin 0.3, indirect bilirubin 0.8.  Sodium 136, potassium 4, chloride 100, CO2 27, glucose 145, BUN 8,  creatinine 0.97, calcium 9.2.  Urinalysis essentially negative, with the  exception of small bilirubin and a trace of ketones.  WBC 11.3, hemoglobin  13.3, platelets 292.   ASSESSMENT:  1. Acute on chronic pancreatitis.  The patient's pancreatic lipase level      is quite elevated.  He does not smell of alcohol.  He denies any recent      cocaine use.  The patient obviously has chronic pancreatitis as      evidenced by pancreatic stones.  As stated above, the patient was      referred to gastroenterologist Dr. Hunt Oris in [place].  However,      the patient was lost to followup.  2. Hypertension.  The patient was prescribed lisinopril during the August      2007 hospitalization.  He is not consistently compliant with the      medical regimen.  His blood pressure is stable currently.  3. Nausea/vomiting, with possible coffee-ground emesis.  The patient may     have a  superimposed gastritis, as he has taken Advil intermittently      over the past two days.  His hemoglobin is within normal limits.  4. Mild hyperglycemia.  It is unclear whether or not the venous glucose      was taken  fasting.  5. Transient second-degree heart block in August 2007.  The patient's EKG  reveals normal sinus rhythm, without evidence of heart block.  6. History of cocaine and alcohol abuse.  The patient denies any recent      use of either.  7. Tobacco abuse.   PLAN:  1. The patient will be admitted for further evaluation and management.  2. He was given multiple doses of Dilaudid and Reglan in the emergency      department which have subsided his pain a little.  3. Will continue pain management with Dilaudid intravenously.  Will add      Reglan 5 mg IV q.6 h. x24 h., and then p.r.n. thereafter.  Will add      Phenergan and Zofran p.r.n. for nausea and vomiting.  4. Empiric and prophylactic Protonix 40 mg IV q.12 h.  5. Vasotec intravenously p.r.n. for a systolic blood pressure greater than      180.  Restart lisinopril when he is able to take p.o. medications.  6. Nicotine patch.  Tobacco cessation counseling.  7. Will check an urine drug screen.  8. Will check an ultrasound of the abdomen for a noninvasive evaluation of      his abdomen/hepatobiliary system/pancreas.  9. Follow up on lab data in the morning, June 16, 2006.      Elliot Cousin, M.D.  Electronically Signed     DF/MEDQ  D:  06/15/2006  T:  06/15/2006  Job:  [id number]

## 2010-12-23 NOTE — H&P (Signed)
[facility]  Patient:    Rodney Barber, Rodney Barber                     MRN: [id number] Adm. Date:  [id number] Attending:  Miguel Aschoff                         History and Physical  DATE OF BIRTH:  August 29, 1995  CHIEF COMPLAINT:  Abdominal pain with nausea and vomiting.  HISTORY OF PRESENT ILLNESS:  This 45 year old black male presents with no complaints and being in a normal state of good health until he awoke from sleep at 8 a.m. yesterday morning.  At that time he noticed the onset of moderately severe midepigastric and left upper quadrant burning pain.  The patient also was unable to eat breakfast, lunch or dinner yesterday because ingestion of food was associated with nausea and vomiting.  The patient obtained Tylenol 500 from the store and took this for pain but had difficulty keeping this down as well.  He awoke this morning and noticed the pain was continuous and actually seemed to be worsening through the day.  The patient chose to come in for evaluation and treatment.  The patient admits to drinking a average of two 40 oz. beers each day, but stopped drinking alcohol the day the abdominal pain started.  The patient denies any hematemesis or hematochezia but does admit to some dark stools.  PAST MEDICAL HISTORY:  The patient denies any ongoing medical problems or use of medications.  The patient does admit to "liver problems" five years ago secondary to alcohol abuse.  PAST SURGICAL HISTORY:  Negative.  FAMILY HISTORY:  The patients father was an alcoholic who had high blood pressure.  The patients mother has diabetes.  The patient brother has a history of peptic ulcer disease.  Family history is negative for pancreatitis.  SOCIAL HISTORY:  The patient admits to drinking at least two 40 oz. beers each day but denies use of wine or liquor.  He smokes a half a pack of cigarettes per day and also smokes a small amount "a dime bag" of  crack cocaine at least once a week, last using three days ago.  REVIEW OF SYSTEMS:  HEAD:  Denies headaches.  EYES:  Denies visual disturbance.  ENT:  Denies any problems.  NECK:  Denies any pain or stiffness. LUNGS:  Denies any cough or shortness of breath.  HEART:  Denies any chest pains or palpitations.  ABDOMEN:  See above, but also denies hematemesis. GENITOURINARY:  Denies any dysuria, frequency or urgency.  SKIN:  Denies any rash but admits to yellow sclerae.  NEUROLOGIC:  Admits to lightheadedness but denies any ______ neurological symptoms.  ALLERGIES:  NKDA.  PHYSICAL EXAMINATION:  VITAL SIGNS:  Blood pressure 167/107, pulse 88, respirations 20, temperature 97.0.  GENERAL:  The patient appears to be a young black male in moderate distress secondary to midepigastric pain.  HEENT:  The head is normocephalic, atraumatic.  Eyes:  PERRLA.  EOMs intact. Fundi benign.  Sclerae are noted to be yellowish in color.  NECK:  Supple.  Full range of motion without adenopathy or thyromegaly noted.  LUNGS:  Clear to auscultation with good bilateral breath sounds.  Good inspiratory and expiratory effort noted.  HEART:  Regular rate and rhythm without gallops, rubs, or murmurs.  S1 and S2 within normal limits.  Chest wall no pain to palpation.  ABDOMEN:  Soft, nondistended with moderate pain to palpation at the midepigastric and left upper quadrant regions.  Bowel sounds were minimally diminished.  There is no rebound or guarding noted.  No masses or organomegaly appreciated.  GU:  Normal male testicles, descended bilaterally.  RECTAL:  Examination not performed by me because Marcelina Morel, [facility] Assistant performed the examination.  Examination was negative as was Hemoccult for blood.  SKIN:  No rash or lesions present.  Skin was warm and dry.  EXTREMITIES:  Within normal limits with pulses +2/+4 bilaterally and equal. Full range of motion  noted.  NEUROLOGICAL:  Cranial nerves II-XII intact grossly.  Motor and sensory are intact.  Cerebellar intact.  DTRs +2/+4 bilaterally and equal.  ADMISSION LABORATORY DATA:  CBC:  White count 10.6 with hemoglobin and hematocrit of 60.3 and 49.5.  Platelet count 163,000 and normal.  CMET showed normal labs with the exception of an elevated glucose of 145 and elevated total bilirubin of 2.9.  SGOT was 67 and SGPT 36.  Calcium normal at 9.3. Amylase elevated to 1661 and lipase elevated to 2205.  Urine drug screen positive for cocaine and tricyclics.  INITIAL IMPRESSION:  Acute pancreatitis, alcohol-induced.  PLAN: 1. Admit. 2. IV hydration. 3. Follow calcium, electrolytes and renal functions carefully. 4. Demerol and Phenergan IM as needed for discomfort and nausea. 5. N.P.O. with slowly advanced diet as tolerated. 6. IV Cipro for early UTI symptoms. 7. Urine culture. 8. Reevaluate in the morning. DD:  01/29/00 TD:  01/30/00 Job: [id number]

## 2010-12-23 NOTE — Discharge Summary (Signed)
NAME:  Rodney Barber, SALEEBY NO.:  [id number]   MEDICAL RECORD NO.:  [id number]          PATIENT TYPE:  INP   LOCATION:  1404                         FACILITY[facility]  [facility]   PHYSICIAN:  Hillery Aldo, M.D.   DATE OF BIRTH:  06/21/1966   DATE OF ADMISSION:  05/27/2005  DATE OF DISCHARGE:  06/02/2005                                 DISCHARGE SUMMARY   PRIMARY CARE PHYSICIAN:  Patient is unassigned but will be referred to  [facility] upon discharge.   DISCHARGE DIAGNOSES:  1.  Acute pancreatitis.  2.  Pancreatic stricture.  3.  Alcohol-induced hepatitis.  Viral hepatitis serologies negative.  4.  Alcohol abuse.  5.  Hypertension.  6.  Fever, resolved.   DISCHARGE MEDICATIONS:  Clonidine 0.1 mg p.o. b.i.d.   CONSULTATIONS:  [facility].   DISCHARGE LABORATORY VALUES:  Lipase was 248.  CBC showed a white blood  count of 9.3, hemoglobin 12.9, hematocrit 38.3, platelets 461.  Sodium 138,  potassium 4.1, chloride 99, bicarb 28, BUN 5, creatinine 0.8, glucose 138,  alkaline phosphatase 162, AST 34, ALT 32, protein 6.7, albumin 2.9, calcium  9.2.   BRIEF ADMISSION HISTORY OF PRESENT ILLNESS:  Patient is a 45 year old male  with a known history of alcohol abuse who presented to the hospital on  May 27, 2005 with severe incontinent upper abdominal pain exacerbated by  eating.  He also had some nausea and vomiting along with a fairly  significant 20 pound weight loss.  He was admitted for further evaluation  and diagnostic workup.   PROCEDURES/DIAGNOSTIC STUDIES:  1.  Abdominal ultrasound on May 27, 2005 showed hepatic edema with a      dilated pancreatic duct.  There was calcification within the pancreatic      head, representing parenchymal versus a distal common bile duct stone.      Recommendations were made for further evaluation with CT scan.  2.  CT scan of the abdomen with contrast on May 28, 2005 showed      pancreatitis with pancreatic ductal  dilatation and multiple stones,      which appeared to be in the pancreatic duct and may be obstructing it.      There was possible small pseudocyst adjacent to the second portion of      the duodenum versus a fluid-filled diverticulum.  There was a small      amount of ascites and mild peripancreatic edema.  3.  Chest x-ray on May 28, 2005 showed no active cardiopulmonary      disease.  4.  MRI of the abdomen with and without contrast on May 30, 2005 showed      multiple small pancreatic pseudocysts, primarily along the body and      proximal head of the pancreas.  There was what appeared to be a      stricture in the pancreatic duct at the junction of the head and body      with dilatation of the distal duct.  There was no evidence of discrete      stones within the pancreatic duct, as was  suggested on a CT scan.  There      was no significant biliary ductal dilatation.  There was peripancreatic      inflammation, consistent with pancreatitis.   HOSPITAL COURSE:  Problem 1:  Abdominal pain secondary to pancreatitis:  The  patient was admitted and kept n.p.o.  He was placed on IV fluid hydration  and p.r.n. pain medication.  GI consultation was obtained and provided by  Dr. Juanda Chance.  Initially, the plan was to possibly do an MRCP versus ERCP to  remove what was thought to be ductal stones; however, further evaluation  with the MRI did not suggest any evidence of stones in the duct.  He was  therefore treated conservatively and over the course of his hospitalization,  required less pain medications and was able to advance his diet without any  return of abdominal pain or nausea and vomiting.  By the day of discharge,  he was tolerating his low fat diet without difficulty and was no longer  requesting any pain medications.  He was therefore deemed stable for  discharge and was set up for GI followup with Dr. Juanda Chance on November 20th.  He will have a recheck of an MRI with MRCP in  approximately six weeks time.  Problem 2:  Alcohol-induced hepatitis:  The patient did have some elevation  of his LFTs upon initial presentation.  Viral hepatitis studies were done  and were negative.  Over the course of his hospitalization, his LFTs trended  down.  His hepatitis was therefore felt to be secondary to alcohol abuse.  This diagnosis was supported by the fact that his GGT was checked and found  to be markedly elevated at 1148 on May 27, 2005.  Problem 3:  Alcohol abuse:  Patient was advised on the importance of  avoiding alcohol given his current medical condition.  He will be referred  to alcohol and drug services upon discharge for further treatment.  Problem 4:  Hypertension:  The patient did have some mild hypertension.  He  was started on low dose clonidine 0.1 mg b.i.d., and this controlled his  hypertension well.  He will be discharged on this medication.  Problem 5:  Fever:  The patient did develop a transient fever.  He was  empirically put on Primaxin for this.  This was discontinued after two days  of therapy secondary to defervescence and no evidence of ongoing active  infection.   DISPOSITION:  The patient will be discharged home and will have followup  arranged at [facility].  He has a follow-up appointment with a  gastroenterologist already scheduled.  He is instructed to avoid alcohol and  to maintain a low fat diet.   CONDITION AT DISCHARGE:  Improved.           ______________________________  Hillery Aldo, M.D.     CR/MEDQ  D:  06/02/2005  T:  06/02/2005  Job:  [id number]

## 2010-12-23 NOTE — Discharge Summary (Signed)
[facility]  Patient:    Rodney Barber, Rodney Barber                     MRN: [id number] Adm. Date:  [id number] Disc. Date: [id number] Attending:  Miguel Aschoff                           Discharge Summary  DATE OF BIRTH:  02-01-1966  CONSULTATIONS:  None.  PROCEDURES:  None.  DISCHARGE DIAGNOSES: 1. Acute pancreatitis. 2. Polysubstance abuse. 3. Alcohol abuse.  DISCHARGE MEDICATION:  Percocet one q.6h. p.r.n. short-term.  FOLLOWUP:  The patient is given the number for [facility] to arrange eligibility appointments, although he declines that he will need any follow-ups.  HISTORY OF PRESENT ILLNESS:  The patient is a 45 year old man who presented with a history of being in his normal state of good health until he awoke from sleep the morning prior to admission with the onset of moderately severe mid epigastric and left upper quadrant burning pain.  He was unable to eat subsequently, and developed nausea and vomiting.  He attempted Tylenol which he was unable to keep down.  The pain had progressed and he sought treatment in the emergency room.  He did admit to drinking two 40 oz. beers daily until the onset of pain yesterday and had experienced a dark bowel movement.  On admission, his vital signs were abnormal with blood pressure 167/107, pulse 88, temperature 97.  He was in mild distress and his examination was most significant for nondistended, soft abdomen with moderate painful palpation and diminished bowel sounds.  Rectal was negative hemoccult in the emergency room.  LABORATORY DATA:  Labs include a hemoglobin of 6.3 with white count 7.6. Sodium 136, potassium 4, BUN 6, creatinine 1.1, bicarb 23, total bilirubin 2.9.  Alk. phos. 98.  SGOT 67, SGPT 36.  Amylase 1661, lipase 2205.  Urine drug screen was notable for cocaine and tricyclics.  Rodney Barber was admitted with acute pancreatitis and alcohol and  cocaine abuse.  HOSPITAL COURSE:  Rodney Barber was placed on IV fluid support, n.p.o. status gradually increasing to clear liquids and then a regular diet, and symptomatic care including narcotics and antiemetics.  With control of his pain, blood pressure also improved.  One day following admission, he was experiencing excellent improvement and narcotics changed to oral along with advancement of diet.  At the time of discharge he was experiencing much less pain and had a normal bowel movement, also eating without difficulty.  He was stable for discharge with greatly improved pancreatitis and counseled regarding alcohol and cocaine cessation.  I have discussed this with him in detail, as did care management. He denied difficulty with drugs, although later stated that he had a pending court date and expected that he would be ordered to take part in an outpatient drug and alcohol program such as [facility].  He was provided with information regarding this program. #1 - DD:  03/19/00 TD:  03/21/00 Job: [id number]

## 2010-12-23 NOTE — Consult Note (Signed)
NAME:  Rodney Barber, Rodney Barber NO.:  [id number]   MEDICAL RECORD NO.:  [id number]          PATIENT TYPE:  INP   LOCATION:  1411                         FACILITY:  [facility]   PHYSICIAN:  Rollene Rotunda, MD, FACCDATE OF BIRTH:  12-26-1965   DATE OF CONSULTATION:  03/31/2006  DATE OF DISCHARGE:  04/01/2006                                   CONSULTATION   REASON FOR CONSULTATION:  Evaluate patient with heart block.   HISTORY OF PRESENT ILLNESS:  The patient is a 45 year old gentleman without  prior cardiac history.  He was admitted on March 27, 2006, with recurrent  pancreatitis, probably alcohol induced.  He has been recovering from this.  He was noted on telemetry to have heart block.  This appears to be Mobitz  type I, though cannot be absolutely sure that there is not a lower block.  He was asleep when this happened.   On careful questioning, the patient denies any cardiac symptoms.  He is  quite active.  He does yard work.  He walks quite a bit.  He never had chest  discomfort, neck discomfort, arm discomfort, activity induced nausea,  vomiting, excessive diaphoresis.  He has never had any palpitation,  presyncope or syncope.  He has never had any PND or orthopnea.   PAST MEDICAL HISTORY:  1. Pancreatitis.  2. ETOH use.  3. Ongoing cocaine use.  4. Hypertension.   ALLERGIES:  NONE.   MEDICATIONS:  None.   SOCIAL HISTORY:  The patient does lawn care.  He is single and has never  been married.  He has never had children.  He lives with his mother.  He  smokes a pack of cigarettes a day.   FAMILY HISTORY:  Is contributory for a father who died of a myocardial  infarction in 1964.  Otherwise negative for all other systems.   PHYSICAL EXAMINATION:  The patient is in no distress.  Blood pressure  151/91, heart rate 65 and regular, afebrile.  HEENT:  Eyes unremarkable, pupils equal, round, react to light and fundi not  visualized, oral mucosa unremarkable.  NECK:   No jugular venous distention at 45 degrees, carotid upstroke brisk  and symmetrical, no bruits, no thyromegaly.  Lymphatics __________.  LUNGS:  Clear to auscultation bilaterally.  BACK:  No costovertebral angle tenderness.  CHEST:  Unremarkable.  HEART:  PMI not displaced or sustained, S1 and S2 within normal limits, no  S3, no S4, no murmurs.  ABDOMEN:  Flat, mildly tender, positive bowel sounds normal in frequency and  pitch, no bruits, no rebound, no guarding, no midline pulses, no masses, no  cardiomegaly.  SKIN:  No rashes, no nodules.  EXTREMITIES:  Pulses 2+ throughout, no edema, no cyanosis, no clubbing.  NEURO:  Oriented to person, place and time, cranial nerves II-XII grossly  intact, motor grossly intact throughout.   LABS:  Hemoglobin 12.6, WBC 10.3, platelets 201, sodium 139, potassium 3.7,  BUN 5, creatinine 0.9, TSH not drawn.   ASSESSMENT AND PLAN:  Heart block.  The patient had heart block with narrow  complex rhythm.  I am going  to check an EKG and make sure there are no  conduction abnormalities.  This appears in most of the strips to be  Wenckebach.  There is a slight possibility it could be a rhythm lower down  on the conduction system.  Regardless, it happened while he was asleep.  He  has been completely asymptomatic.  He has an absolutely normal physical  examination and no contributory history.  Given that no further therapy is  warranted, the patient should let us know if he ever gets lightheadedness,  palpitations, presyncope or syncope.           ______________________________  Rollene Rotunda, MD, [facility]     JH/MEDQ  D:  03/31/2006  T:  04/01/2006  Job:  [id number]

## 2010-12-23 NOTE — Discharge Summary (Signed)
NAME:  Rodney Barber, Rodney Barber NO.:  [id number]   MEDICAL RECORD NO.:  [id number]          PATIENT TYPE:  INP   LOCATION:  1411                         FACILITY:  [facility]   PHYSICIAN:  Isidor Holts, M.D.  DATE OF BIRTH:  1966-01-04   DATE OF ADMISSION:  03/27/2006  DATE OF DISCHARGE:  04/01/2006                                 DISCHARGE SUMMARY   PRIMARY MEDICAL DOCTOR:  Gentry Fitz.   DISCHARGE DIAGNOSES:  1. Recurrent acute pancreatitis.  2. Pancreatic duct stricture/pancreatic head calculi.  3. Hypertension.  4. Smoker.  5. Substance abuse, i.e., cocaine.  6. Transient second degree heart block, i.e., Wenckebach phenomenon.   DISCHARGE MEDICATIONS:  1. Protonix 40 mg p.o. daily.  2. Lisinopril 10 mg p.o. daily.  3. Vicodin (5/500) one p.o. p.r.n. q.6 h., a total of 42 pills have been      dispensed.   PROCEDURES:  1. Abdominal/chest CT scan, dated March 27, 2006.  This showed      nonobstructive bowel gas pattern.  2. Abdominal/pelvic CT scan, dated March 27, 2006.  This showed      pancreatic duct dilatation to 8-mm, multiple pancreatic stones are seen      in the pancreatic head, pancreatic ductal dilatation could be due to a      stricture or stone in the duct.  These findings are similar to a prior      CT.  There is inflammatory type edema in the retroperitoneum with      epicenter around the descending duodenum.  The duodenum is somewhat      thickened and this could be due to pancreatitis or possibly a primary      duodenal process, such as peptic ulcer disease or duodenitis.  There is      a small amount of ascites.  There is no pseudocyst.  There was free      fluid in the pelvis, without mass or abscess.   CONSULTATIONS:  Rollene Rotunda, MD, [facility], cardiology.  Claudette Head, MD, Gastroenterology.   ADMISSION HISTORY:  As in H&P notes of March 27, 2006. However, in brief,  this is a 45 year old male, with known history of recurrent acute  pancreatitis, at least 2 episodes in the past.  Last admission was from  May 27, 2005 to June 02, 2005, with alcohol induced pancreatitis and  alcoholic hepatitis.  Abdominal MRI of March 30, 2006, during that  hospitalization, showed multiple small pancreatic pseudocysts, primarily  along the body and proximal head of the pancreas.  There was what appeared  to be a stricture in the pancreatic duct at the junction of the head and  body with dilatation of the distal duct.  There were no discrete stones  within the pancreatic duct.  No significant biliary ductal dilatation.  There was also peripancreatic inflammation consistent with pancreatitis.  The patient has a remote history of alcohol abuse, although he claims to  have quit drinking over 4 months ago, he continues to utilize cocaine and  smokes approximately 1/2 packet of cigarettes per day.  He presents on this  occasion, with a  2-day history of epigastric pain and one episode of  vomiting.  Initial laboratory evaluation demonstrated markedly elevated  serum lipase at 1,488.  AST was 17. ALT 20.  Alkaline phosphatase 87.  BUN  was 9 with a creatinine of 1.1.  The patient had leukocytosis of 13.3.  He  was therefore admitted for further evaluation, investigation, and  management.   CLINICAL COURSE:  1. Acute pancreatitis, recurrent.  As noted above, the patient has had at      least 2 previous episodes of acute pancreatitis, although this was      against a background of rather heavy alcohol use.  On this occasion,      the patient reportedly has not been drinking for the past 4 months.      However, this must be deemed recurrent pancreatitis, probably beginning      to acquire a certain degree of chronicity.  The patient was treated      with bowel rest, intravenous fluid hydration, proton pump inhibitor,      and analgesics, with satisfactory clinical response.  There was a      steady trending downward of serum lipase  levels, and as of March 31, 2006, lipase was down to 67.  By day #2 of hospitalization, the patient      was able to manage clear fluids.  We subsequently gradually broadened      his dietary intake, and by March 31, 2006, he was consuming a low-fat      diet without any relapse of symptoms.  Because of markedly elevated      presenting serum lipase levels and leukocytosis, it was felt that a GI      consultation was warranted.  This was kindly provided by Dr. Claudette Head, who was quite helpful in the management of this patient.  The      patient had initially been placed on Primaxin, however, as abdominal CT      scan showed no evidence of pancreatic necrosis, abscess or phlegmon,      this was subsequently discontinued.  The patient`s white cell count has      normalized in the interim.  He was never pyrexial, during the course of      his hospital stay.  Given pancreatic CT scan findings as described in      admission history, Dr. Claudette Head has recommended that following      discharge, the patient should be referred to Dr. Danny Lawless at [facility], Gastroenterology Department      for evaluation and recommendations and possible long term followup.  I      contacted Dr. San Jetty office on March 30, 2006, and they would like      a copy of the patient's discharge summary, which I have undertaken to      make available to them for Dr. Marilynne Drivers to review and hopefully,      subsequently contact the patient to schedule an appointment.   1. Hypertension.  The patient has a history of hypertension, but is not on      regular medications.  At the time of presentation, he was normotensive.      However, his blood pressure has crept up gradually during the course of      hospital stay and on March 31, 2006, his blood pressure was 150/98.  He is therefore being commenced on Lisinopril 10 mg p.o. daily.  The      patient has been  strongly urged to establish an PMD for continued      followup and preventative care.  If he is unable to find a PMD, one      will be recommended, and, if there are insurance issues, he is likely      to be referred to [facility].   1. Smoking history.  The patient has been counseled appropriately.  He was      managed with Nicoderm CQ patch during the course of his hospital stay.   1. Substance abuse, i.e., cocaine.  This was confirmed by urine drug      screen testing.  The patient has been counseled appropriately.   1. Transient second degree heart block.  In the night of March 30, 2006      and a.m. of March 31, 2006, the patient was noted to have on telemetrc      monitoring, transient arrhythmia, consistent with second degree heart      block of type II, i.e. Wenckebach phenomenon.  There were no      recurrences during the daytime, on March 31, 2006.  The patient      remained asymptomatic.  Dr. Rollene Rotunda was consulted for cardiology      input.  He was evaluated the patient and reassured [place] that no further      therapy or evaluation is warranted.  The patient is certainly not on      any culprit medications, which may have been contributory.   DISPOSITION:  It is anticipated that provided no acute issues occur  overnight, the patient will be discharged in the a.m. of April 01, 2006, in  satisfactory condition.   DIET:  Low-fat diet.   ACTIVITY:  As tolerated.   WOUND CARE:  Not applicable.   PAIN MANAGEMENT:  Refer to discharge medication list.   FOLLOWUP INSTRUCTIONS:  The patient has been strongly urged to establish a  PMD for routine primary and preventative care, and for followup of his  hypertension.  As mentioned above, we have contacted Dr. Melvyn Neth  office at [facility], Gastroenterology  Division, telephone number [phone], to schedule an appointment.  We  have faxed a copy of this discharge summary to Dr.  San Jetty office, fax  number [phone], attention, Arvid Right, for Dr. San Jetty review.  I am  reassured that following review, Dr. Marilynne Drivers is likely to contact the  patient to schedule an appointment.  All of this has been communicated to  the patient.  He has verbalized understanding.      Isidor Holts, M.D.  Electronically Signed     CO/MEDQ  D:  03/31/2006  T:  03/31/2006  Job:  [id number]   cc:   Dr. Danny Lawless Attn:  Arvid Right for Dr. Marilynne Drivers review  [facility]  Gastroenterology Division  Fax:  [phone]   Venita Lick. Russella Dar, MD, FACG  [address]  [place]  [place] [address]   Rollene Rotunda, MD, [facility]  [address]  [address]  [place]  [place] [address]

## 2010-12-23 NOTE — H&P (Signed)
NAME:  NAT, LOWENTHAL NO.:  [id number]   MEDICAL RECORD NO.:  [id number]          PATIENT TYPE:  INP   LOCATION:  0103                         FACILITY:  [facility]   PHYSICIAN:  Isidor Holts, M.D.  DATE OF BIRTH:  05-May-1966   DATE OF ADMISSION:  03/27/2006  DATE OF DISCHARGE:                                HISTORY & PHYSICAL   PMD:  Unassigned.   CHIEF COMPLAINT:  Vomiting x1.  Also abdominal pain for two days.   HISTORY OF PRESENT ILLNESS:  This is a 45 year old male.  For past medical  history, see below.  According to patient, he was quite well until March 25, 2006. In a.m. of March 26, 2006, he vomited x1 on waking up, and  developed epigastric/right lower quadrant pain, which has become persistent  and somewhat crampy in character.  His appetite is markedly diminished.  He  denies diarrhea.  He denies fever.  He states that he has had no alcohol  intake for the past four months.  The patient has a history of prior  admission to [facility] on May 27, 2005 until  June 02, 2005 with alcoholic-induced pancreatitis and alcoholic  hepatitis.  He was found at that time to have a pancreatic stricture.  The  patient took over-the-counter Tylenol and Advil to no avail on this  occasion, and eventually came to the emergency department.   PAST MEDICAL HISTORY:  1. Recurrent acute pancreatitis.  2. Remote history of alcohol abuse.  3. Remote history of substance abuse (cocaine).  4. Hypertension.  5. Admission to [facility] on May 27, 2005 to      June 02, 2005 with alcohol-induced acute pancreatitis, alcoholic      hepatitis, and pancreatic stricture.   MEDICATIONS:  None regular.  Patient utilizes OTC Advil p.r.n.   ALLERGIES:  No known drug allergies.   REVIEW OF SYSTEMS:  As per HPI and chief complaint, otherwise negative.   SOCIAL HISTORY:  The patient works in [employer]rap[employer].  He is single.  He has no  offspring.  Smokes approximately half a pack of cigarettes a day.  He denies  alcohol use.  He states that although he used to be a heavy drinker, he quit  drinking over four months ago.  He denies drug abuse, although he did  utilize cocaine in 2001.   Patient has four brothers, two drink rather heavily, otherwise alive and  well.  He has one sister who is also alive and well.  His mother is  hypertensive and diabetic.  His father died at age 63 years, status post MI.  Family history is otherwise noncontributory.   PHYSICAL EXAMINATION:  VITAL SIGNS:  Temperature 97.4, pulse 80 per minute  and regular, respiratory rate 16, BP 134/88 mmHg.  Pulse oximetry 100% on  room air.  GENERAL:  Patient appears quite comfortable.  Not in obvious acute distress,  after analgesics administered in the emergency department.  HEENT:  No clinical pallor.  No jaundice.  No conjunctival injection.  NECK:  Supple.  JVP not seen.  No  palpable lymphadenopathy.  No palpable  goiter.  CHEST:  Clinically clear to auscultation.  No wheezes or crackles.  HEART:  Heart sounds 1 and 2 heard.  Normal.  Regular.  No murmurs.  ABDOMEN:  Flat, soft.  There is tenderness in the epigastrium and  right  upper quadrant, and rather vague discomfort, right lower quadrant.  Bowel  sounds are heard.  EXTREMITIES:  Lower extremity examination is quite unremarkable.  MUSCULOSKELETAL:  Unremarkable.  CENTRAL NERVOUS SYSTEM:  No focal neurologic deficits on gross examination.   INVESTIGATIONS:  CBC:  WBC 13.3, hemoglobin 14.1, hematocrit 43, platelets  231, neutrophils 87%.  Electrolytes:  Sodium 137, potassium 3.8, chloride  101, CO2 27, BUN 9, creatinine 1.1, glucose 141, AST 17, ALT 20, alkaline  phosphatase 87.  Urinalysis was negative.  Lipase is markedly elevated at  1488.   Abdominal/pelvic CT scan dated March 27, 2006:  This showed pancreatic duct  dilated to 8 mm with several pancreatic head calculi.  Findings are  overall  similar to CT scan of October, 2006.  There is also edema and thickening of  the proximal duodenum and a small amount of free fluid.   ASSESSMENT/PLAN:  1. Severe acute pancreatitis:  We shall admit the patient to the step-down      unit and keep n.p.o. for bowel rest.  Administer intravenous fluids,      antiemetics, proton pump inhibitor.  We shall commence empiric      Primaxin, because of elevated WBC, although there is no evidence of      phlegmon or hemorrhagic pancreatitis on abdominal CT scan.  We shall      request GI consultation.   1. Hypertension:  The patient has a history of hypertension, but is not on      regular medications.  Currently, he is normotensive.  We shall monitor.   1. Remote history of substance abuse.  Patient states that he has not      utilized illegal substances since 2001.  We shall for completeness, do      a urine drug screen.   1. Smoking history:  We shall counsel appropriately, and utilize Nicoderm      CQ patch.   Further management will depend on the clinical course.      Isidor Holts, M.D.  Electronically Signed     CO/MEDQ  D:  03/27/2006  T:  03/27/2006  Job:  [id number]

## 2010-12-23 NOTE — Consult Note (Signed)
NAME:  Rodney Barber, Rodney Barber            ACCOUNT NO.:  [id number]   MEDICAL RECORD NO.:  [id number]          PATIENT TYPE:  INP   LOCATION:  1430                         FACILITY:  [facility]   PHYSICIAN:  Anselm Pancoast. Weatherly, M.D.DATE OF BIRTH:  02-07-1966   DATE OF CONSULTATION:  06/15/2006  DATE OF DISCHARGE:                                   CONSULTATION   CHIEF COMPLAINT:  Hematoma paraduodenal area, history of chronic  pancreatitis.   HISTORY:  Rodney Barber is a 45 year old black male with history of  alcohol abuse and some other substances who was hospitalized here in August  with acute/chronic pancreatitis and was noted to have problems with  calcifications and a dilated pancreatic duct.  At that time, he also had  some inflammatory changes around the duodenum and was seen with the [facility]  Gastroenterologist.  According to the discharge summary was supposed to see  Dr. Danny Lawless at [facility] in the gastroenterology division.  The patient states that no one ever contacted him about an appointment and  several days ago he started having epigastric pain and presented back to the  emergency room and was admitted her on June 15, 2006 to the hospitalist  service.  The patient's symptoms were abdominal pain, nausea and vomiting  and he was thought to have acute and chronic pancreatitis.  He had a CT the  following day that showed a mass around the duodenum, kind of the head of  the pancreas area that was not present previously and then yesterday had a  mesenteric arteriogram that shows a little blush knot on one of the major  vessels of the gastroduodenal artery but I kind in the subhepatic area felt  it to be possible pseudoaneurysm.  The patient's pain subsided but the  hemoglobin that was approximately 10 on admission dropped to 8.2.  He was  typed and crossed and given two units of blood and general surgery  consultation was requested.   On physical exam, the  patient's abdomen is not acutely tender and his pain  has always been localized in the epigastric area.  I reviewed the CT and  also the mesenteric arteriogram with the  radiologist that did the angiogram  and it is hopeful that this will basically spontaneously just not rebleed  and what if any intervention is to be considered for the chronic  pancreatitis I am not sure.  If the patient would continue to bleed or  rebleed and require surgery, the area that we think is the origin of the  hematoma is located along the second portion of the duodenum in the  subhepatic area but it is fortunately not locally around any of the major  vessels and it would be necessary to open up into the hematoma hopefully  control the bleeding but also have him endoscope intraoperatively to make  sure that there is not an active ulceration or ulcer from the duodenum that  somehow or other is contributing to this hematoma.  I would not endoscope  him at this time since the CT did show that there is definitely an  extrinsic  pressure on the second portion of the duodenum from the hematoma but would  consider doing an endoscopy at the time of this surgery so if anything was  noted it could be managed at that time.  The patient has now received the  two units of blood.  Hemodynamically, he has always been stable.  His  hematocrit is now 30 and he has serial [employer] q.8h.  He is on a  liquid diet.  We will re-examine him in the morning and we will kind of  watch his vital signs and hemoglobin and hematocrit.  It is hopeful that  this is not going to require a expiration and if he would require an  expiration for the bleeding I do not think I would try to combine any type  of piece over procedures for the chronic pancreatitis simultaneously.  If he  would be noted to have a duodenal ulcer which had been questioned on the CT  and all back in August it would be necessary and advisable to manage that at  the same  surgery for the bleeding.           ______________________________  Anselm Pancoast. Zachery Dakins, M.D.     WJW/MEDQ  D:  06/17/2006  T:  06/18/2006  Job:  [id number]   cc:   Patient's chart

## 2010-12-23 NOTE — H&P (Signed)
NAME:  Rodney Barber, Rodney Barber            ACCOUNT NO.:  [id number]  MEDICAL RECORD NO.:  [id number]           PATIENT TYPE:  E  LOCATION:  WLED                         FACILITY:  [facility]  PHYSICIAN:  Kathlen Mody, MD       DATE OF BIRTH:  1966/03/28  DATE OF ADMISSION:  12/08/2010 DATE OF DISCHARGE:                             HISTORY & PHYSICAL   PRIMARY CARE PHYSICIAN:  None.  CHIEF COMPLAINT:  Abdominal pain and diarrhea since 3 weeks.  HISTORY OF PRESENT ILLNESS:  This is a 45 year old gentleman with a history of chronic pancreatitis, came in complaining of mild-to-moderate right upper quadrant epigastric pain since 3 weeks associated with loose bowel movements since 3 weeks.  Not associated with any nausea or vomiting.  The patient does report loss of weight, more than 15 pounds in the last 4 weeks.  Bowel movements are loose, no blood.  Abdominal pain is mild to moderate associated with p.o. intake, worse on eating solid food, gets better with liquid diet.  Severity of pain is about 2- 3/10 at this time.  The patient denies any other complaints.  Denies chest pain, shortness of breath, fever, cough, headaches, blurry vision, syncope, any weakness, tingling, or numbness, etc.  The patient reports decreased appetite and a bitter taste in the mouth.  REVIEW OF SYSTEMS:  See HPI, otherwise negative.  PAST MEDICAL HISTORY:  Chronic pancreatitis, last episode of acute-on- chronic pancreatitis was about 5 years ago.  PAST SURGICAL HISTORY:  None.  ALLERGIES:  No known drug allergies.  HOME MEDICATIONS:  Occasional Advil for abdominal pain.  FAMILY HISTORY:  Not significant.  SOCIAL HISTORY:  The patient states he quit alcohol about 5 years ago when he had acute-on-chronic pancreatitis episode.  The patient reports still smoking and occasional cocaine use.  The patient is unemployed at this time.  He has never been tested for hepatitis B or C or HIV.  PHYSICAL EXAMINATION:  VITAL  SIGNS:  The patient is afebrile, blood pressure of 115/72, pulse of 80 per minute, respiratory rate 16 per minute, saturating about 97% on room air. GENERAL:  He is alert, afebrile, oriented x3, and comfortable, in no acute distress. HEENT EXAM:  Pupils reacting to light.  Scleral icterus present.  No JVD.  Dry mucous membranes.  There is temporal wasting.  The patient looks cachectic. CARDIOVASCULAR EXAM:  S1, S2 heard. RESPIRATORY EXAM:  Good air entry bilateral.  No wheezing or rhonchi. ABDOMEN:  Soft, tender in the right upper quadrant and epigastric area, nondistended.  Bowel sounds are heard.  No signs of peritonitis. EXTREMITIES:  No pedal edema. NEUROLOGICAL EXAM:  Nonfocal at this time.  PERTINENT LABORATORY DATA:  The patient had a urinalysis, shows moderate bilirubin and urine glucose more than 1000, negative for nitrites and leukocytes.  CBC appears to be within normal limits.  Comprehensive metabolic panel significant for a sodium of 134, glucose of 341, elevated bilirubin of 7.3, alkaline phosphatase of 1051, AST of 202, ALT 226, lipase of 174.  RADIOLOGY:  The patient had an abdominal x-ray, shows gaseous distention of few loops of small bowel, cannot exclude  partial SBO, moderate amount of feces throughout the colon.  Chest x-ray, no active lung disease.  ASSESSMENT AND PLAN:  This is a 45 year old gentleman with a history of chronic pancreatitis, came in with abdominal pain and diarrhea, weight loss since 3 weeks, able to pass gas and has ongoing diarrhea at this time.  On admission to ER, he was found to have an elevated liver function test and elevated lipase.  He is being admitted for acute-on- chronic pancreatitis.  The patient is on clear liquids at this time.  He is able to tolerate clear liquids without any abdominal pain.  We will start the patient on IV fluids at 150 cc/hr, pain control with Dilaudid 0.5 mg to 1 mg q.6 h. p.r.n.  Ultrasound abdomen is  ordered to look for any CBD dilatation or pancreatic duct.  We will continue the patient on clear liquid diet at this time.  Start the patient on IV Zofran 4 mg q.6 h. p.r.n. for nausea.  We will send the stool for fecal leukocytes.  We will also send for hepatitis profile, hepatitis A, hepatitis B and C in the morning.  We will get repeat amylase, lipase, and a CMP in the morning.  We will also get an alcohol level right now.  Hyperglycemia, most likely secondary to glucose intolerance that might be occurring in chronic pancreatitis or could be secondary to destruction of beta cells in the pancreas, but we will get a hemoglobin A1c.  We will also start the patient on sliding scale insulin with sensitive scale coverage.  We will not start the patient on a basal insulin at this time.  Hyponatremia, most likely secondary to hyperglycemia.  We will repeat electrolytes in the morning.  Deep venous thrombosis prophylaxis.  Subcu Lovenox.  The patient is full code.          ______________________________ Kathlen Mody, MD     VA/MEDQ  D:  12/08/2010  T:  12/08/2010  Job:  [id number]  Electronically Signed by Kathlen Mody MD on 12/23/2010 09:02:24 PM

## 2011-01-04 NOTE — Op Note (Signed)
NAME:  Rodney Barber, Rodney Barber NO.:  [id number]  MEDICAL RECORD NO.:  [id number]           PATIENT TYPE:  I  LOCATION:  1503                         FACILITY:  [facility]  PHYSICIAN:  Petra Kuba, M.D.    DATE OF BIRTH:  01-13-1966  DATE OF PROCEDURE:  12/13/2010 DATE OF DISCHARGE:                              OPERATIVE REPORT   PROCEDURE:  Endoscopic retrograde cholangiopancreatography, sphincterotomy, brushing, balloon pull-through, and stent placement.  INDICATION:  Patient with CBD obstruction, questionable CBD stones, questionable pancreatic cancer versus chronic pancreatitis stricture. Consent was signed after risks, benefits, methods, options thoroughly discussed multiple times in the past.  MEDICINES USED:  General anesthesia.  PROCEDURE IN DETAIL:  The side-viewing therapeutic video duodenoscope was inserted by indirect vision into the stomach and advanced into the duodenum where a moderately bulbous, but normal-appearing ampulla was brought into view.  Using the triple-lumen sphincterotome loaded with the Jagwire, we were able to cannulate the head of the pancreas, but could not advance deep into the pancreas.  We did try multiple maneuvers to try to cannulate the CBD, but were unsuccessful.  We did inject just a little dye a few times with minimal going into the pancreas to try to better delineate the anatomy.  We did switch to the smaller sphincterotome loaded with the smaller wire without additional benefits and unable to cannulate the CBD.  After a moderate effort based on his bulbous ampulla, we went ahead and proceeded with a needle knife sphincterotomy and deep selective cannulation was obtained in the customary fashion without much trouble.  There was no bleeding done with this procedure. Dye was injected once we thought we had deep selective cannulation and there was a distal 3-4 cm stricture, a dilated CBD, a patent cystic duct, and possibly 1 tiny  to small stone seen in the CBD versus an air bubble.  We initially exchanged the needle knife sphincterotome for the conventional sphincterotome and enlarged the sphincterotomy in the customary fashion.  We were able to get the half to three-quarters bowed sphincterotome easily in and out of the duct, but based on the stricture could not get the fully bowed sphincterotome in and out of the duct.  We elected not to cut any further.  We went ahead and exchanged the sphincterotome for the brush and brushings were done of the stricture in the customary fashion.  We then advance the adjustable balloon 10-15 and we were able to pull the balloon through the stricture and some debris and sludge were removed, but no frank stone particles.  Lots of black bile was seen draining throughout the procedure once the sphincterotomy was done.  We did not pop the balloon. An occlusion cholangiogram did not reveal any obvious additional stones which was done in the customary fashion.  We then went ahead and placed a 10-French 5-cm stent in the customary fashion with excellent biliary drainage and excellent endoscopic and fluoroscopic position.  The scope was removed after the wire was removed.  The patient tolerated the procedure well.  There was no obvious immediate complication.  ENDOSCOPIC DIAGNOSES: 1. Bulbous ampulla. 2. Few minimal PD  injections and few wire advancements into the head     of the pancreas with some curling. 3. Unable to cannulate using the regular in the smaller sphincterotome     and irregular in the smaller wire. 4. Needle knife sphincterotomy done in the customary fashion with deep     selective CBD cannulation obtained. 5. 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 6. Cystic duct patent and gallbladder dilated. 7. Increased sphincterotomy site and few 12-15 mm adjustable balloon     pull throughs with some debris and sludge being  removed. 8. Distal duct stricture brushed and a 10-French 5-cm plastic stent     placed with excellent biliary drainage.  PLAN:  We will observe for delayed complications.  We will await brushings.  If no delayed complications, can advance diet tomorrow.  If brushings are negative, proceed with an EUS next week and recheck CA-19- 9 when his liver tests have decreased which sometimes give you a highly false positive which this one maybe.  Once discharged, happy to see back as an outpatient and coordinate all the above.          ______________________________ Petra Kuba, M.D.     MEM/MEDQ  D:  12/13/2010  T:  12/14/2010  Job:  [id number]  Electronically Signed by Vida Rigger M.D. on 01/04/2011 01:29:11 PM

## 2011-02-20 NOTE — Consult Note (Signed)
NAME:  FREDDERICK, SWANGER NO.:  [id number]  MEDICAL RECORD NO.:  [id number]           PATIENT TYPE:  I  LOCATION:  1503                         FACILITY:  [facility]  PHYSICIAN:  Willis Modena, MD     DATE OF BIRTH:  1966-01-07  DATE OF CONSULTATION:  12/09/2010 DATE OF DISCHARGE:                                CONSULTATION   REASON FOR CONSULTATION:  Pancreatitis and jaundice.  CHIEF COMPLAINT:  Abdominal pain, diarrhea.  HISTORY OF PRESENT ILLNESS:  Mr. 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  He was in a static state of health until about 3 weeks ago.  At that time, he developed some worsening postprandial epigastric and right upper quadrant pain.  He also developed worsening diarrhea, refractory to Imodium and Pepto-Bismol.  He has lost over 15 pounds in the past month or so.  Past medical history, past surgical history, home medications, allergies, family history, social history, review of systems; all from dictated note from Dr. Blake Divine dated Dec 08, 2010.  I have reviewed and I agree.  PHYSICAL EXAMINATION:  VITAL SIGNS:  Temperature 98.3, heart rate 64, respiratory rate 16, blood pressure 110/77, oxygen saturation 99% on room air. GENERAL:  Mr. Gilkes is nontoxic appearing but appears older than stated age. SKIN:  He has some tattoos. NECK:  Somewhat cachectic but supple without thyromegaly, lymphadenopathy or bruits. ENT:  Fair dentition.  No oropharyngeal lesions. EYES:  Sclerae icteric.  Conjunctivae are somewhat pale. LUNGS:  Clear. HEART:  Regular. ABDOMEN:  Mildly distended.  He has got some epigastric tenderness.  No obvious liver or splenic enlargement. EXTREMITIES:  No peripheral cyanosis, clubbing or edema. NEUROLOGIC:  Diffusely weak, nonfocal without lateralizing signs.  LABORATORY DATA:  White count 8.7, hemoglobin 14, platelet count 199, INR 0.97.  Sodium 134, potassium 3.6,  chloride 198, bicarb 25, BUN 10, creatinine 0.6, total bilirubin has gone from 7.3 to 5.7, alk phos from 1051 to 690, AST from 202 to 135, ALT 226 to 139, amylase slightly elevated at 168, lipase 174 and then 150 now, hemoglobin A1c is 15. Alcohol level is undetectable.  Urine specific gravity 1.044.  RADIOLOGIC STUDIES:  Include a CT scan of the abdomen and pelvis which I personally reviewed, shows some biliary dilatation and some calcifications in the pancreatic head with a suggestion of choledocholithiasis, has a cystic area in the head of the pancreas that looks like a pseudocyst and a new area in the left hepatic lobe looks more like a benign lesion but cannot exclude a small malignancy.  Also some dilatation of the pancreatic duct, some diffuse mesenteric edema possibly due to pancreatitis.  Ultrasound shows similar findings, also shows sludge and some distention of the gallbladder.  IMPRESSION:  Mr. Hur is a 45 year old gentleman presenting with progressive abdominal pain, diarrhea, weight loss and jaundice.  I suspect most of findings are due to chronic pancreatitis.  I suspect he has a component of choledocholithiasis as well.  Pancreatic mass or ampullary lesion is less favored but with a double duct sign as well as a history of chronic pancreatitis,  these issues cannot be completely excluded.  PLAN: 1. Continue supportive management for pancreatitis with clear liquid     diet, IV fluids, judicious analgesia. 2. We will add pancreatic enzymes for presumed pancreatic     insufficiency related diarrhea. 3. Check CEA and CA19-9 levels. 4. We will follow LFTs serially. 5. The patient has no evidence of cholangitis at this time but will     ultimately require an ERCP with possible endoscopic ultrasound as     well but these are not urgent at this time. 6. We will follow along with you.  Thanks for allowing me to participate in Mr. Forrer's care.     Willis Modena, MD     WO/MEDQ  D:  12/09/2010  T:  12/10/2010  Job:  [id number]  Electronically Signed by Willis Modena  on 02/20/2011 01:14:34 PM

## 2011-03-08 HISTORY — PX: ERCP W/ PLASTIC STENT PLACEMENT: SHX1522

## 2011-04-03 ENCOUNTER — Ambulatory Visit (HOSPITAL_COMMUNITY): Admission: RE | Admit: 2011-04-03 | Payer: Self-pay | Source: Ambulatory Visit | Admitting: Gastroenterology

## 2011-04-04 ENCOUNTER — Other Ambulatory Visit: Payer: Self-pay | Admitting: Gastroenterology

## 2011-04-04 ENCOUNTER — Ambulatory Visit (HOSPITAL_COMMUNITY): Payer: Self-pay

## 2011-04-04 ENCOUNTER — Ambulatory Visit (HOSPITAL_COMMUNITY)
Admission: RE | Admit: 2011-04-04 | Discharge: 2011-04-04 | Disposition: A | Discharge status: Home / Self Care | Payer: Self-pay | Source: Ambulatory Visit | Attending: Gastroenterology | Admitting: Gastroenterology

## 2011-04-04 DIAGNOSIS — K861 Other chronic pancreatitis: Secondary | ICD-10-CM | POA: Insufficient documentation

## 2011-04-04 DIAGNOSIS — R978 Other abnormal tumor markers: Secondary | ICD-10-CM | POA: Insufficient documentation

## 2011-04-04 DIAGNOSIS — K831 Obstruction of bile duct: Secondary | ICD-10-CM | POA: Insufficient documentation

## 2011-04-04 LAB — CBC
HCT: 41.4 % (ref 39.0–52.0)
Hemoglobin: 13.7 g/dL (ref 13.0–17.0)
RDW: 13.8 % (ref 11.5–15.5)
WBC: 7.9 10*3/uL (ref 4.0–10.5)

## 2011-04-04 LAB — COMPREHENSIVE METABOLIC PANEL
AST: 125 U/L — ABNORMAL HIGH (ref 0–37)
Albumin: 3.9 g/dL (ref 3.5–5.2)
Alkaline Phosphatase: 404 U/L — ABNORMAL HIGH (ref 39–117)
Calcium: 8.3 mg/dL — ABNORMAL LOW (ref 8.4–10.5)
Creatinine, Ser: 0.68 mg/dL (ref 0.50–1.35)
GFR calc non Af Amer: >60 mL/min (ref >60)
Glucose, Bld: 329 mg/dL — ABNORMAL HIGH (ref 70–99)
Sodium: 133 mEq/L — ABNORMAL LOW (ref 135–145)
Total Bilirubin: 0.4 mg/dL (ref 0.3–1.2)
Total Protein: 7.4 g/dL (ref 6.0–8.3)

## 2011-04-04 LAB — TYPE AND SCREEN
ABO/RH(D): O POS
Antibody Screen: POSITIVE

## 2011-04-26 NOTE — Op Note (Signed)
NAME:  Rodney Barber, Rodney Barber NO.:  [id number]  MEDICAL RECORD NO.:  [id number]  LOCATION:  WLEN                         FACILITY:  [facility]  PHYSICIAN:  Petra Kuba, M.D.    DATE OF BIRTH:  28-Jan-1966  DATE OF PROCEDURE: DATE OF DISCHARGE:                              OPERATIVE REPORT   PROCEDURE:  ERCP, brushing and stent change.  INDICATION:  Patient with chronic pancreatitis and biliary stricture, but also with increased CA19-9, wanted to reevaluate and change stent if stricture still present.  Consent was signed after risks, benefits, methods, options thoroughly discussed multiple times in the past and prior to sedation.  Medicines used per general anesthesia.  PROCEDURE:  The side-viewing therapeutic video duodenoscope was inserted by indirect vision into the stomach and advanced into the duodenum after some bilious material was suctioned.  The previously placed plastic stent was coming from the patent sphincterotomy site, but was found in the duodenum moderate ways.  It was easily snared, withdrawn through the scope and sent for pathology.  Using the triple-lumen sphincterotome loaded with Jag wire, deep selective cannulation was obtained on the first attempt through the patent sphincterotomy site.  The ampulla was normal and there was no PD injections or wire advancements throughout the procedure.  We went ahead and injected dye, which confirmed the stricture on measuring and it seemed to be about 3 cm and no other obvious proximal or intrahepatic abnormalities were seen.  The stricture was distal, some cystic duct filling was seen which was fairly high on the common bile duct.  We went ahead and exchanged the sphincterotome for the 12-15 mm adjustable balloon, which injected below the balloon and proceeded with occlusion cholangiograms, which confirmed the stricture on 12 mm balloon pull-through, some small stones, sludge and debris was withdrawn on the  first, but on two more subsequent balloon pullthroughs no stones or debris was removed.  We again then proceeded with an occlusion cholangiogram, which did show the stricture to be fairly tight and once we removed the balloon, there was no obvious biliary drainage.  We went ahead and brushed the stricture in the customary fashion again and then placed a 10-French 4-cm covered metal removable stent in the customary fashion, both under endoscopic and fluoro guidance with excellent positioning at the end of the procedure with adequate biliary drainage.  The scope was removed.  The patient tolerated the procedure well.  There was no obvious immediate complication.  ENDOSCOPIC DIAGNOSES: 1. Stent a little far out in the duodenum status post snared and sent     for pathology. 2. Normal ampulla without PD injections or wire advancements     throughout the procedure. 3. Cannulated the patent sphincterotomy site, which confirmed the     distal 3 cm stricture status post brushing after the balloon pull-     through. 4. 12-15 mm adjustable balloon pull-through with few stones, debris,     and sludge being removed and occlusion cholangiogram confirming the     stricture. 5. 10-French 4-cm metal covered removable stent placed with adequate     biliary drainage.  PLAN:  Customary post ERCP orders, observe for delayed complications. If none, hopefully  he can go home later today.  Will slowly advance diet and await pathology but probably proceed with repeat CT or pancreas MRI in probably a month just to be sure no obvious mass lesions seen on follow up exam and will see back p.r.n. or in 1 month and probably repeat ERCP to remove this stent in 3 months and hopefully we can leave the stent out at that juncture and he will call me sooner p.r.n.          ______________________________ Petra Kuba, M.D.     MEM/MEDQ  D:  04/04/2011  T:  04/04/2011  Job:  [id number]  Electronically Signed by  Vida Rigger M.D. on 04/26/2011 02:55:57 PM

## 2011-05-12 ENCOUNTER — Other Ambulatory Visit (HOSPITAL_COMMUNITY): Payer: Self-pay | Admitting: Gastroenterology

## 2011-05-12 DIAGNOSIS — K861 Other chronic pancreatitis: Secondary | ICD-10-CM

## 2011-06-06 ENCOUNTER — Ambulatory Visit (HOSPITAL_COMMUNITY)
Admission: RE | Admit: 2011-06-06 | Discharge: 2011-06-06 | Disposition: A | Discharge status: Home / Self Care | Payer: Self-pay | Source: Ambulatory Visit | Attending: Gastroenterology | Admitting: Gastroenterology

## 2011-06-06 ENCOUNTER — Ambulatory Visit (HOSPITAL_COMMUNITY): Admission: RE | Admit: 2011-06-06 | Payer: Self-pay | Source: Ambulatory Visit | Admitting: Gastroenterology

## 2011-06-06 DIAGNOSIS — K859 Acute pancreatitis without necrosis or infection, unspecified: Secondary | ICD-10-CM | POA: Insufficient documentation

## 2011-06-06 DIAGNOSIS — K862 Cyst of pancreas: Secondary | ICD-10-CM | POA: Insufficient documentation

## 2011-06-06 DIAGNOSIS — K8689 Other specified diseases of pancreas: Secondary | ICD-10-CM | POA: Insufficient documentation

## 2011-06-06 DIAGNOSIS — K863 Pseudocyst of pancreas: Secondary | ICD-10-CM | POA: Insufficient documentation

## 2011-06-06 DIAGNOSIS — K861 Other chronic pancreatitis: Secondary | ICD-10-CM

## 2011-06-06 DIAGNOSIS — R911 Solitary pulmonary nodule: Secondary | ICD-10-CM | POA: Insufficient documentation

## 2011-06-06 MED ORDER — GADOBENATE DIMEGLUMINE 529 MG/ML IV SOLN
15.0000 mL | Freq: Once | INTRAVENOUS | Status: AC | PRN
  Administered 2011-06-06: 11 mL via INTRAVENOUS

## 2011-06-26 ENCOUNTER — Encounter (HOSPITAL_COMMUNITY): Payer: Self-pay

## 2011-06-26 ENCOUNTER — Inpatient Hospital Stay (HOSPITAL_COMMUNITY): Admission: RE | Admit: 2011-06-26 | Payer: Self-pay | Source: Ambulatory Visit

## 2011-06-27 ENCOUNTER — Ambulatory Visit (HOSPITAL_COMMUNITY): Payer: Self-pay | Admitting: Anesthesiology

## 2011-06-27 ENCOUNTER — Encounter (HOSPITAL_COMMUNITY): Payer: Self-pay | Admitting: Anesthesiology

## 2011-06-27 ENCOUNTER — Encounter (HOSPITAL_COMMUNITY)
Admission: RE | Disposition: A | Discharge status: Home / Self Care | Payer: Self-pay | Source: Ambulatory Visit | Attending: Gastroenterology

## 2011-06-27 ENCOUNTER — Other Ambulatory Visit: Payer: Self-pay | Admitting: Gastroenterology

## 2011-06-27 ENCOUNTER — Encounter (HOSPITAL_COMMUNITY): Payer: Self-pay | Admitting: Gastroenterology

## 2011-06-27 ENCOUNTER — Ambulatory Visit (HOSPITAL_COMMUNITY)
Admission: RE | Admit: 2011-06-27 | Discharge: 2011-06-27 | Disposition: A | Discharge status: Home / Self Care | Payer: Self-pay | Source: Ambulatory Visit | Attending: Gastroenterology | Admitting: Gastroenterology

## 2011-06-27 ENCOUNTER — Ambulatory Visit (HOSPITAL_COMMUNITY): Payer: Self-pay

## 2011-06-27 DIAGNOSIS — K831 Obstruction of bile duct: Secondary | ICD-10-CM | POA: Insufficient documentation

## 2011-06-27 DIAGNOSIS — K861 Other chronic pancreatitis: Secondary | ICD-10-CM | POA: Insufficient documentation

## 2011-06-27 HISTORY — PX: ERCP: SHX5425

## 2011-06-27 LAB — GLUCOSE, CAPILLARY
Glucose-Capillary: 97 mg/dL (ref 70–99)
Glucose-Capillary: 67 mg/dL — ABNORMAL LOW (ref 70–99)
Glucose-Capillary: 77 mg/dL (ref 70–99)

## 2011-06-27 LAB — TYPE AND SCREEN
ABO/RH(D): O POS
Antibody Screen: POSITIVE
DAT, IgG: NEGATIVE

## 2011-06-27 SURGERY — ERCP, WITH INTERVENTION IF INDICATED
Anesthesia: General

## 2011-06-27 MED ORDER — SUCCINYLCHOLINE CHLORIDE 20 MG/ML IJ SOLN
INTRAMUSCULAR | Status: DC | PRN
  Administered 2011-06-27: 100 mg via INTRAVENOUS

## 2011-06-27 MED ORDER — MEPERIDINE HCL 25 MG/ML IJ SOLN: 6.2500 mg | INTRAMUSCULAR | Status: DC | PRN

## 2011-06-27 MED ORDER — LACTATED RINGERS IV SOLN: INTRAVENOUS | Status: DC

## 2011-06-27 MED ORDER — DEXTROSE 50 % IV SOLN
INTRAVENOUS | Status: AC
  Filled 2011-06-27: qty 50

## 2011-06-27 MED ORDER — SODIUM CHLORIDE 0.9 % IV SOLN
INTRAVENOUS | Status: DC | PRN
  Administered 2011-06-27 (×2): via INTRAVENOUS

## 2011-06-27 MED ORDER — ONDANSETRON HCL 4 MG/2ML IJ SOLN
INTRAMUSCULAR | Status: DC | PRN
  Administered 2011-06-27 (×2): 2 mg via INTRAVENOUS

## 2011-06-27 MED ORDER — PROPOFOL 10 MG/ML IV EMUL
INTRAVENOUS | Status: DC | PRN
  Administered 2011-06-27: 150 mg via INTRAVENOUS
  Administered 2011-06-27: 50 mg via INTRAVENOUS

## 2011-06-27 MED ORDER — SODIUM CHLORIDE 0.9 % IV SOLN: INTRAVENOUS | Status: DC | PRN

## 2011-06-27 MED ORDER — LACTATED RINGERS IV SOLN
INTRAVENOUS | Status: DC
  Administered 2011-06-27: 12:00:00 via INTRAVENOUS

## 2011-06-27 MED ORDER — DEXTROSE 50 % IV SOLN
6.2500 g | Freq: Once | INTRAVENOUS | Status: AC
  Administered 2011-06-27: 6.25 g via INTRAVENOUS

## 2011-06-27 MED ORDER — FENTANYL CITRATE 0.05 MG/ML IJ SOLN
INTRAMUSCULAR | Status: DC | PRN
  Administered 2011-06-27 (×5): 50 ug via INTRAVENOUS

## 2011-06-27 MED ORDER — CISATRACURIUM BESYLATE 2 MG/ML IV SOLN
INTRAVENOUS | Status: DC | PRN
  Administered 2011-06-27: 4 mg via INTRAVENOUS

## 2011-06-27 MED ORDER — MIDAZOLAM HCL 5 MG/5ML IJ SOLN
INTRAMUSCULAR | Status: DC | PRN
  Administered 2011-06-27 (×2): 1 mg via INTRAVENOUS

## 2011-06-27 MED ORDER — SODIUM CHLORIDE 0.9 % IV SOLN
INTRAVENOUS | Status: AC
  Filled 2011-06-27: qty 1.5

## 2011-06-27 MED ORDER — DEXTROSE 5 % IV SOLN
1.0000 g | Freq: Once | INTRAVENOUS | Status: AC
  Administered 2011-06-27: 1 g via INTRAVENOUS
  Filled 2011-06-27: qty 1

## 2011-06-27 NOTE — Progress Notes (Signed)
1624  CBG 67 soda 200cc and crakers given for snack.    1650 CBG 77. P Warner Mccreedy

## 2011-06-27 NOTE — Anesthesia Postprocedure Evaluation (Signed)
  Anesthesia Post-op Note  Patient: Rodney Barber  Procedure(s) Performed:  ENDOSCOPIC RETROGRADE CHOLANGIOPANCREATOGRAPHY (ERCP)  Patient Location: PACU  Anesthesia Type: General  Level of Consciousness: awake and alert   Airway and Oxygen Therapy: Patient Spontanous Breathing  Post-op Pain: mild  Post-op Assessment: Post-op Vital signs reviewed, Patient's Cardiovascular Status Stable, Respiratory Function Stable, Patent Airway and No signs of Nausea or vomiting  Post-op Vital Signs: stable  Complications: No apparent anesthesia complications  

## 2011-06-27 NOTE — H&P (Signed)
  Please see scans H&P from recent office visit and patient's seen and examined prior to procedure

## 2011-06-27 NOTE — Preoperative (Signed)
Beta Blockers   Reason not to administer Beta Blockers:Not Applicable 

## 2011-06-27 NOTE — Transfer of Care (Signed)
Immediate Anesthesia Transfer of Care Note  Patient: Rodney Barber  Procedure(s) Performed:  ENDOSCOPIC RETROGRADE CHOLANGIOPANCREATOGRAPHY (ERCP)  Patient Location: PACU  Anesthesia Type: General  Level of Consciousness: awake, sedated and patient cooperative  Airway & Oxygen Therapy: Patient Spontanous Breathing and Patient connected to face mask oxygen  Post-op Assessment: Report given to PACU RN, Post -op Vital signs reviewed and stable and Patient moving all extremities X 4  Post vital signs: Reviewed and stable  Complications: No apparent anesthesia complications

## 2011-06-27 NOTE — Anesthesia Preprocedure Evaluation (Signed)
Anesthesia Evaluation  Patient identified by MRN, date of birth, ID band Patient awake    Reviewed: Allergy & Precautions, H&P , NPO status , Patient's Chart, lab work & pertinent test results  Airway Mallampati: II TM Distance: >3 FB Neck ROM: Full    Dental No notable dental hx. (+) Poor Dentition   Pulmonary neg pulmonary ROS, Current Smoker,  clear to auscultation  Pulmonary exam normal       Cardiovascular neg cardio ROS Regular Normal    Neuro/Psych Negative Neurological ROS  Negative Psych ROS   GI/Hepatic negative GI ROS, Neg liver ROS,   Endo/Other  Negative Endocrine ROSDiabetes mellitus-, Insulin Dependent  Renal/GU negative Renal ROS  Genitourinary negative   Musculoskeletal negative musculoskeletal ROS (+)   Abdominal   Peds negative pediatric ROS (+)  Hematology negative hematology ROS (+)   Anesthesia Other Findings   Reproductive/Obstetrics negative OB ROS                           Anesthesia Physical Anesthesia Plan  ASA: II  Anesthesia Plan: General   Post-op Pain Management:    Induction: Intravenous  Airway Management Planned: Oral ETT  Additional Equipment:   Intra-op Plan:   Post-operative Plan: Extubation in OR  Informed Consent: I have reviewed the patients History and Physical, chart, labs and discussed the procedure including the risks, benefits and alternatives for the proposed anesthesia with the patient or authorized representative who has indicated his/her understanding and acceptance.   Dental advisory given  Plan Discussed with: CRNA  Anesthesia Plan Comments:         Anesthesia Quick Evaluation

## 2011-06-27 NOTE — Anesthesia Postprocedure Evaluation (Signed)
  Anesthesia Post-op Note  Patient: Rodney Barber  Procedure(s) Performed:  ENDOSCOPIC RETROGRADE CHOLANGIOPANCREATOGRAPHY (ERCP)  Patient Location: PACU  Anesthesia Type: General  Level of Consciousness: awake and alert   Airway and Oxygen Therapy: Patient Spontanous Breathing  Post-op Pain: mild  Post-op Assessment: Post-op Vital signs reviewed, Patient's Cardiovascular Status Stable, Respiratory Function Stable, Patent Airway and No signs of Nausea or vomiting  Post-op Vital Signs: stable  Complications: No apparent anesthesia complications

## 2011-07-04 ENCOUNTER — Encounter (HOSPITAL_COMMUNITY): Payer: Self-pay | Admitting: Gastroenterology

## 2011-07-06 NOTE — H&P (Signed)
The patient is reevaluated before his procedure and has been asymptomatic and feeling fine without any GI problems. 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

## 2013-05-07 ENCOUNTER — Emergency Department (INDEPENDENT_AMBULATORY_CARE_PROVIDER_SITE_OTHER)
Admission: EM | Admit: 2013-05-07 | Discharge: 2013-05-07 | Disposition: A | Discharge status: Home / Self Care | Payer: Self-pay | Source: Home / Self Care

## 2013-05-07 ENCOUNTER — Encounter (HOSPITAL_COMMUNITY): Payer: Self-pay

## 2013-05-07 DIAGNOSIS — R197 Diarrhea, unspecified: Secondary | ICD-10-CM

## 2013-05-07 DIAGNOSIS — E119 Type 2 diabetes mellitus without complications: Secondary | ICD-10-CM

## 2013-05-07 LAB — POCT I-STAT, CHEM 8
BUN: 4 mg/dL — ABNORMAL LOW (ref 6–23)
Chloride: 101 mEq/L (ref 96–112)
Creatinine, Ser: 0.9 mg/dL (ref 0.50–1.35)
Glucose, Bld: 213 mg/dL — ABNORMAL HIGH (ref 70–99)
Hemoglobin: 15.6 g/dL (ref 13.0–17.0)
Potassium: 3.8 mEq/L (ref 3.5–5.1)
Sodium: 141 mEq/L (ref 135–145)

## 2013-05-07 LAB — HIV ANTIBODY (ROUTINE TESTING W REFLEX): HIV: NONREACTIVE

## 2013-05-07 MED ORDER — DOXYCYCLINE HYCLATE 100 MG PO CAPS: 100.0000 mg | ORAL_CAPSULE | Freq: Two times a day (BID) | ORAL | Status: DC

## 2013-05-07 MED ORDER — INSULIN ASPART PROT & ASPART (70-30 MIX) 100 UNIT/ML ~~LOC~~ SUSP: SUBCUTANEOUS | Status: DC

## 2013-05-07 NOTE — ED Provider Notes (Signed)
CSN: [id number]     Arrival date & time 05/07/13  1329 History   None    Chief Complaint  Patient presents with  . Weakness   (Consider location/radiation/quality/duration/timing/severity/associated sxs/prior Treatment) HPI Comments: Pt has been out of insulin and bp meds for a year. Over the course of this year he has lost 20 pounds without trying. For last 3 weeks, pt has felt "weak" and had daily oily diarrhea 5-6 times per day.   Patient is a 47 y.o. male presenting with weakness. The history is provided by the patient.  Weakness This is a new problem. Episode onset: 3 weeks ago. The problem occurs constantly. The problem has not changed since onset.Pertinent negatives include no chest pain, no abdominal pain, no headaches and no shortness of breath. Nothing aggravates the symptoms. Nothing relieves the symptoms. He has tried nothing for the symptoms.    Past Medical History  Diagnosis Date  . Pancreatitis   . Diabetes mellitus    Past Surgical History  Procedure Laterality Date  . Ercp w/ plastic stent placement  03/2011  . Ercp  06/27/2011    Procedure: ENDOSCOPIC RETROGRADE CHOLANGIOPANCREATOGRAPHY (ERCP);  Surgeon: Petra Kuba, MD;  Location: [facility];  Service: Endoscopy;  Laterality: N/A;   History reviewed. No pertinent family history. History  Substance Use Topics  . Smoking status: Current Every Day Smoker    Types: Cigarettes  . Smokeless tobacco: Not on file     Comment: 6 cigarett /day  . Alcohol Use: No    Review of Systems  Constitutional: Negative for fever and chills.  Respiratory: Negative for shortness of breath.   Cardiovascular: Negative for chest pain.  Gastrointestinal: Positive for diarrhea. Negative for nausea, vomiting and abdominal pain.  Endocrine: Negative for polydipsia, polyphagia and polyuria.  Neurological: Positive for weakness. Negative for headaches.    Allergies  Review of patient's allergies indicates no known  allergies.  Home Medications   Current Outpatient Rx  Name  Route  Sig  Dispense  Refill  . doxycycline (VIBRAMYCIN) 100 MG capsule   Oral   Take 1 capsule (100 mg total) by mouth 2 (two) times daily.   20 capsule   0   . insulin aspart (NOVOLOG) 100 UNIT/ML injection   Subcutaneous   Inject into the skin 3 (three) times daily before meals.           . insulin aspart protamine- aspart (NOVOLOG MIX 70/30) (70-30) 100 UNIT/ML injection      10 units SQ BID before breakfast and dinner   10 mL   12   . insulin glargine (LANTUS) 100 UNIT/ML injection   Subcutaneous   Inject 10 Units into the skin at bedtime.          . perindopril (ACEON) 2 MG tablet   Oral   Take 2 mg by mouth daily.           . ramipril (ALTACE) 2.5 MG capsule   Oral   Take 2.5 mg by mouth daily.            BP 125/83  Pulse 71  Temp(Src) 97.9 F (36.6 C) (Oral)  Resp 18  SpO2 100% Physical Exam  Constitutional: He appears well-developed and well-nourished. No distress.  Gaunt/cachetic appearing  Cardiovascular: Normal rate and normal heart sounds.   Pulmonary/Chest: Effort normal and breath sounds normal.  Abdominal: Soft. Bowel sounds are normal. He exhibits no distension. There is no tenderness. There is no rebound and  no guarding.    ED Course  Procedures (including critical care time) Labs Review Labs Reviewed  STOOL CULTURE  CLOSTRIDIUM DIFFICILE BY PCR  HIV ANTIBODY (ROUTINE TESTING)   Imaging Review No results found.  MDM   1. Diabetes   2. Diarrhea   I'm uncertain of cause of diarrhea. Istat normal except blood glucose is 213.  Discussed with Dr. Lorenz Coaster. Pt referred to [facility] for f/u. HIV, stool culture and stool for c diff ordered.  If pt cannot provide stool sample today, will be given instructions for getting sample at home and returning sample to [facility].  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  Rx  novolog 70/30 10 units BID before breakfast and dinner.      Cathlyn Parsons, NP 05/07/13 1540

## 2013-05-07 NOTE — ED Notes (Signed)
Call back number for lab issues verified 

## 2013-05-07 NOTE — ED Notes (Signed)
Reportedly has been out of his medication for ~year or more; weak, loosing weight x 3 months

## 2013-05-07 NOTE — ED Notes (Signed)
Instructed regarding home stool collection

## 2013-05-08 NOTE — ED Provider Notes (Signed)
Medical screening examination/treatment/procedure(s) were performed by non-physician practitioner and as supervising physician I was immediately available for consultation/collaboration.  Ulice Tamala Manzer, M.D.  Zeshan C Falon Huesca, MD 05/08/13 1508 

## 2013-05-11 LAB — STOOL CULTURE: Special Requests: NORMAL

## 2013-07-10 ENCOUNTER — Emergency Department (HOSPITAL_COMMUNITY): Payer: Self-pay

## 2013-07-10 ENCOUNTER — Encounter (HOSPITAL_COMMUNITY): Payer: Self-pay | Admitting: Emergency Medicine

## 2013-07-10 ENCOUNTER — Inpatient Hospital Stay (HOSPITAL_COMMUNITY)
Admission: EM | Admit: 2013-07-10 | Discharge: 2013-07-14 | DRG: 441 | Disposition: A | Discharge status: Home / Self Care | Payer: Self-pay | Attending: Internal Medicine | Admitting: Internal Medicine

## 2013-07-10 DIAGNOSIS — R197 Diarrhea, unspecified: Secondary | ICD-10-CM | POA: Diagnosis present

## 2013-07-10 DIAGNOSIS — K769 Liver disease, unspecified: Secondary | ICD-10-CM | POA: Diagnosis present

## 2013-07-10 DIAGNOSIS — E119 Type 2 diabetes mellitus without complications: Secondary | ICD-10-CM | POA: Diagnosis present

## 2013-07-10 DIAGNOSIS — Z681 Body mass index (BMI) 19 or less, adult: Secondary | ICD-10-CM

## 2013-07-10 DIAGNOSIS — E860 Dehydration: Secondary | ICD-10-CM

## 2013-07-10 DIAGNOSIS — E46 Unspecified protein-calorie malnutrition: Secondary | ICD-10-CM

## 2013-07-10 DIAGNOSIS — E871 Hypo-osmolality and hyponatremia: Secondary | ICD-10-CM | POA: Diagnosis present

## 2013-07-10 DIAGNOSIS — Z833 Family history of diabetes mellitus: Secondary | ICD-10-CM

## 2013-07-10 DIAGNOSIS — K909 Intestinal malabsorption, unspecified: Secondary | ICD-10-CM | POA: Diagnosis present

## 2013-07-10 DIAGNOSIS — E43 Unspecified severe protein-calorie malnutrition: Secondary | ICD-10-CM | POA: Diagnosis present

## 2013-07-10 DIAGNOSIS — K7689 Other specified diseases of liver: Principal | ICD-10-CM | POA: Diagnosis present

## 2013-07-10 DIAGNOSIS — F17200 Nicotine dependence, unspecified, uncomplicated: Secondary | ICD-10-CM | POA: Diagnosis present

## 2013-07-10 DIAGNOSIS — K861 Other chronic pancreatitis: Secondary | ICD-10-CM | POA: Diagnosis present

## 2013-07-10 DIAGNOSIS — E876 Hypokalemia: Secondary | ICD-10-CM | POA: Diagnosis present

## 2013-07-10 DIAGNOSIS — K831 Obstruction of bile duct: Secondary | ICD-10-CM | POA: Diagnosis present

## 2013-07-10 DIAGNOSIS — R64 Cachexia: Secondary | ICD-10-CM | POA: Diagnosis present

## 2013-07-10 DIAGNOSIS — E1169 Type 2 diabetes mellitus with other specified complication: Secondary | ICD-10-CM | POA: Diagnosis present

## 2013-07-10 DIAGNOSIS — R634 Abnormal weight loss: Secondary | ICD-10-CM | POA: Diagnosis present

## 2013-07-10 DIAGNOSIS — Z8249 Family history of ischemic heart disease and other diseases of the circulatory system: Secondary | ICD-10-CM

## 2013-07-10 LAB — CBC WITH DIFFERENTIAL/PLATELET
Eosinophils Absolute: 0.1 10*3/uL (ref 0.0–0.7)
HCT: 37.9 % — ABNORMAL LOW (ref 39.0–52.0)
Hemoglobin: 13.4 g/dL (ref 13.0–17.0)
Lymphs Abs: 2.7 10*3/uL (ref 0.7–4.0)
MCH: 31.2 pg (ref 26.0–34.0)
MCHC: 35.4 g/dL (ref 30.0–36.0)
Monocytes Absolute: 0.4 10*3/uL (ref 0.1–1.0)
Monocytes Relative: 6 % (ref 3–12)
Neutrophils Relative %: 55 % (ref 43–77)
RBC: 4.29 MIL/uL (ref 4.22–5.81)

## 2013-07-10 LAB — PROTIME-INR: INR: 0.94 (ref 0.00–1.49)

## 2013-07-10 LAB — COMPREHENSIVE METABOLIC PANEL
Alkaline Phosphatase: 140 U/L — ABNORMAL HIGH (ref 39–117)
BUN: 6 mg/dL (ref 6–23)
Chloride: 96 mEq/L (ref 96–112)
Creatinine, Ser: 0.57 mg/dL (ref 0.50–1.35)
GFR calc Af Amer: >90 mL/min (ref >90)
Glucose, Bld: 244 mg/dL — ABNORMAL HIGH (ref 70–99)
Potassium: 3.2 mEq/L — ABNORMAL LOW (ref 3.5–5.1)
Total Bilirubin: 0.2 mg/dL — ABNORMAL LOW (ref 0.3–1.2)
Total Protein: 6.3 g/dL (ref 6.0–8.3)

## 2013-07-10 LAB — LIPASE, BLOOD: Lipase: 28 U/L (ref 11–59)

## 2013-07-10 MED ORDER — SODIUM CHLORIDE 0.9 % IV SOLN
INTRAVENOUS | Status: DC
  Administered 2013-07-10 – 2013-07-12 (×3): via INTRAVENOUS

## 2013-07-10 MED ORDER — POTASSIUM CHLORIDE CRYS ER 20 MEQ PO TBCR
60.0000 meq | EXTENDED_RELEASE_TABLET | Freq: Once | ORAL | Status: AC
  Administered 2013-07-10: 60 meq via ORAL
  Filled 2013-07-10: qty 3

## 2013-07-10 MED ORDER — IOHEXOL 300 MG/ML  SOLN
50.0000 mL | Freq: Once | INTRAMUSCULAR | Status: AC | PRN
  Administered 2013-07-10: 50 mL via ORAL

## 2013-07-10 MED ORDER — IOHEXOL 300 MG/ML  SOLN
100.0000 mL | Freq: Once | INTRAMUSCULAR | Status: AC | PRN
  Administered 2013-07-10: 80 mL via INTRAVENOUS

## 2013-07-10 MED ORDER — INSULIN ASPART 100 UNIT/ML ~~LOC~~ SOLN
0.0000 [IU] | Freq: Three times a day (TID) | SUBCUTANEOUS | Status: DC
Start: 2013-07-11 — End: 2013-07-11
  Administered 2013-07-11: 2 [IU] via SUBCUTANEOUS

## 2013-07-10 MED ORDER — SODIUM CHLORIDE 0.9 % IV BOLUS (SEPSIS)
1000.0000 mL | Freq: Once | INTRAVENOUS | Status: AC
  Administered 2013-07-10: 1000 mL via INTRAVENOUS

## 2013-07-10 MED ORDER — ONDANSETRON HCL 4 MG/2ML IJ SOLN: 4.0000 mg | Freq: Four times a day (QID) | INTRAMUSCULAR | Status: DC | PRN

## 2013-07-10 MED ORDER — ENOXAPARIN SODIUM 40 MG/0.4ML ~~LOC~~ SOLN
40.0000 mg | SUBCUTANEOUS | Status: DC
  Administered 2013-07-10 – 2013-07-13 (×4): 40 mg via SUBCUTANEOUS
  Filled 2013-07-10 (×5): qty 0.4

## 2013-07-10 NOTE — Progress Notes (Signed)
[facility] provided pt with a list of primary care resources and information about [facility].  °

## 2013-07-10 NOTE — ED Notes (Signed)
Pt arrived to the ED with a complaint of weakness with associated diarehha.  Pt has had these symptoms for three months.  Pt has lost considerable weight in the last three months. Pt appears in no apparent distress.  PMS intact

## 2013-07-10 NOTE — H&P (Signed)
[facility] History and Physical  Rodney Barber WUJ:[id number] DOB: 1966-03-17 DOA: 07/10/2013  Referring physician: EDP PCP: No PCP Per Patient  Specialists: GI Dr. Ewing Schlein, no Fu in 2years  Chief Complaint: Diarrhea, weakness  HPI: Rodney Barber is a 47 y.o. 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  He presents to the emergency room today with complaints of diarrhea for 3 months, profound weakness. He describes his stools as soft, 4-5 episodes per day, occasionally light colored, otherwise brown, no hematochezia or melena noted. He denies any nausea or vomiting/abdominal pain/ fever/ chills or jaundice etc. In addition he also reports losing: 35-40 pounds of weight in the last 3-6 months. In the ER noted to have hyponatremia, hypokalemia and CT abdomen pelvis with new enhancing right lobe liver lesion and decreased size of the known left lobe liver lesion   Review of Systems: The patient denies anorexia, fever, weight loss,, vision loss, decreased hearing, hoarseness, chest pain, syncope, dyspnea on exertion, peripheral edema, balance deficits, hemoptysis, abdominal pain, melena, hematochezia, severe indigestion/heartburn, hematuria, incontinence, genital sores, muscle weakness, suspicious skin lesions, transient blindness, difficulty walking, depression, unusual weight change, abnormal bleeding, enlarged lymph nodes, angioedema, and breast masses.  Reports abstinence from alcohol for 6-7 years.   Past Medical History  Diagnosis Date  . Pancreatitis   . Diabetes mellitus    Past Surgical History  Procedure Laterality Date  . Ercp w/ plastic stent placement  03/2011  . Ercp  06/27/2011    Procedure: ENDOSCOPIC RETROGRADE CHOLANGIOPANCREATOGRAPHY (ERCP);  Surgeon: Petra Kuba, MD;  Location: [facility];  Service: Endoscopy;  Laterality: N/A;    Social History:  reports that he has been smoking Cigarettes.  He has a 7.5 pack-year smoking history. He has never used smokeless tobacco. He reports that he uses illicit drugs ("Crack" cocaine and Other-see comments). He reports that he does not drink alcohol. Lives at home with his mother  No Known Allergies  family history Mother at is diabetic, father deceased from heart disease Prior to Admission medications   Not on File   Physical Exam: Filed Vitals:   07/10/13 1304  BP: 105/66  Pulse: 78  Temp: 98.5 F (36.9 C)  Resp: 18     General:  Alert awake, extremely cachectic male laying in bed in no distress  HEENT: Multiple dark hyperpigmented scars on his cheeks, oral mucosa dry  CVS S1-S2 regular rate rhythm no murmurs rubs or gallops  Lungs clear auscultation bilaterally  Abdomen soft nontender nondistended no organomegaly positive bowel sounds  Extremities no edema clubbing or cyanosis  Skin no rashes or skin breakdown  Psychiatric appropriate mood and affect  Labs on Admission:  Basic Metabolic Panel:  Recent Labs Lab 07/10/13 1417  NA 132*  K 3.2*  CL 96  CO2 25  GLUCOSE 244*  BUN 6  CREATININE 0.57  CALCIUM 9.1   Liver Function Tests:  Recent Labs Lab 07/10/13 1417  AST 96*  ALT 126*  ALKPHOS 140*  BILITOT 0.2*  PROT 6.3  ALBUMIN 3.5    Recent Labs Lab 07/10/13 1417  LIPASE 28   No results found for this basename: AMMONIA,  in the last 168 hours CBC:  Recent Labs Lab 07/10/13 1417  WBC 7.1  NEUTROABS 3.9  HGB 13.4  HCT 37.9*  MCV 88.3  PLT 194   Cardiac Enzymes: No results found  for this basename: CKTOTAL, CKMB, CKMBINDEX, TROPONINI,  in the last 168 hours  BNP (last 3 results) No results found for this basename: PROBNP,  in the last 8760 hours CBG:  Recent Labs Lab 07/10/13 1455  GLUCAP 177*    Radiological Exams on Admission: Dg Chest 2 View  07/10/2013   CLINICAL DATA:  Week, weight loss, hypertension,  smoking history  EXAM: CHEST  2 VIEW  COMPARISON:  Prior chest x-ray 12/08/2010  FINDINGS: Cardiac and mediastinal contours within normal limits. Atherosclerotic calcifications noted in the transverse aorta. The lungs are normally inflated. No suspicious pulmonary nodule, focal airspace consolidation, pleural effusion or pneumothorax. No acute osseous abnormality.  IMPRESSION: No active cardiopulmonary disease.  Aortic atherosclerosis.   Electronically Signed   By: Malachy Moan M.D.   On: 07/10/2013 17:38   Ct Abdomen Pelvis W Contrast  07/10/2013   CLINICAL DATA:  Fatigue, abdominal distention, chronic diarrhea  EXAM: CT ABDOMEN AND PELVIS WITH CONTRAST  TECHNIQUE: Multidetector CT imaging of the abdomen and pelvis was performed using the standard protocol following bolus administration of intravenous contrast.  CONTRAST:  80mL OMNIPAQUE IOHEXOL 300 MG/ML  SOLN  COMPARISON:  CT abdomen and pelvis Dec 09, 2010  FINDINGS: In the lateral segment left lobe liver, there is a 1.1 x 1.1 cm diffusely enhancing lesion slightly smaller compared to the previous CT. However, there is a new heterogeneously enhancing lesion in the posterior segment of right lobe liver measuring 1.7 x 2.2 cm. This was not previously seen. No other focal liver lesion is identified.  The spleen is normal. There is a small gallstone versus focal gallbladder wall calcification. There are multiple calcifications in the pancreatic head and tail with marked pancreatic ductal dilatation consistent with changes of chronic pancreatitis. The previously described pseudocyst anterior to the pancreatic head is not seen today. The adrenal glands and kidneys are normal. There are mild prominent small bowel loops in the pelvis not changed which could be seen in the ileus. Moderate bowel content is noted throughout colon. There is atherosclerosis of the abdominal aorta without aneurysmal dilatation.  Fluid-filled bladder is normal. Degenerative joint changes  of the spine are noted. The visualized lung bases demonstrate no focal pneumonia or pleural effusion.  IMPRESSION: Enhancing lesion in the left lobe liver slightly smaller compared to prior CT. However, there is a new heterogeneously enhancing lesion in the posterior segment right lobe liver measuring 1.7 x 2.2 cm. The enhancement pattern is not typical for hemangioma. Although the differential includes dysplastic nodule, focal nodular hyperplasia, hepatocellular carcinoma is not excluded.  Mild prominent small bowel loops in the pelvis, can be seen ileus.   Electronically Signed   By: Sherian Rein M.D.   On: 07/10/2013 17:04     Assessment/Plan  1. Enhancing right lobe liver lesion -Likely dysplastic nodule versus focal hyperplasia, unable to rule out malignancy -Will check MRI abdomen -Check alpha-fetoprotein -If lesion concerning on MRI will need biopsy  2. history of known CBD stricture and prior stents -No ductal dilatation noted on CT, FU MRI -ALP only mildly elevated and bili within normal limits  3. diabetes mellitus -Untreated for close to one year -Check HbA1c -Sliding scale insulin  4. Dehydration -Hyponatremia/hypokalemia -Hydrate, replace potassium  5. Chronic diarrhea and weight loss  -Suspect malabsorption from chronic pancreatitis  -C. difficile ordered by EDP will followup, doubt this -Check stool WBC/fat -check HIV -consider pancreatic enzyme supplements and/or Imodium if infectious workup negative   DVT proph: lovenox  Code Status: Full Code Family communication: No family at bedside Disposition Plan: inpatient  Time spent:  [facility] Pager [phone] If 7PM-7AM, please contact night-coverage [email or web address] Password [email or web address] 07/10/2013, 7:50 PM

## 2013-07-10 NOTE — ED Provider Notes (Signed)
Patient has had diarrhea and weight loss. At work is overall reassuring. CT scan shows For mass. Patient does not have good followup. Patient be admitted to medicine  Juliet Rude. Rubin Payor, MD 07/10/13 [phone]

## 2013-07-10 NOTE — ED Provider Notes (Signed)
CSN: [id number]     Arrival date & time 07/10/13  1255 History   First MD Initiated Contact with Patient 07/10/13 1415     Chief Complaint  Patient presents with  . Weakness  . Diarrhea   (Consider location/radiation/quality/duration/timing/severity/associated sxs/prior Treatment) HPI Comments: t is a 47 y.o. male with Pmhx as above who presents with 3 months of daily steattorhea, generalized weakness, fatigue, 20 lb loss.  This is second visit for same. Pt has had neg cl diff, HIV.  No ab pain, fever, urinary symptoms, respiratory symptoms.   Patient is a 47 y.o. male presenting with diarrhea.  Diarrhea Quality:  Oily and watery Severity:  Moderate Onset quality:  Unable to specify Number of episodes:  3-4 daily Timing:  Constant Progression:  Unchanged Relieved by:  Nothing Worsened by:  Nothing tried Ineffective treatments:  None tried Associated symptoms: no abdominal pain, no arthralgias, no chills, no recent cough, no diaphoresis, no fever, no headaches and no vomiting   Associated symptoms comment:  Weakness, fatigue, wt loss Risk factors: no recent antibiotic use, no sick contacts, no suspicious food intake and no travel to endemic areas     Past Medical History  Diagnosis Date  . Pancreatitis   . Diabetes mellitus    Past Surgical History  Procedure Laterality Date  . Ercp w/ plastic stent placement  03/2011  . Ercp  06/27/2011    Procedure: ENDOSCOPIC RETROGRADE CHOLANGIOPANCREATOGRAPHY (ERCP);  Surgeon: Petra Kuba, MD;  Location: [facility];  Service: Endoscopy;  Laterality: N/A;   History reviewed. No pertinent family history. History  Substance Use Topics  . Smoking status: Current Every Day Smoker -- 0.25 packs/day for 30 years    Types: Cigarettes  . Smokeless tobacco: Never Used     Comment: 6 cigarett /day  . Alcohol Use: No    Review of Systems  Constitutional: Positive for fatigue and unexpected weight change. Negative for fever, chills,  diaphoresis, activity change and appetite change.  HENT: Negative for congestion, facial swelling, rhinorrhea and trouble swallowing.   Eyes: Negative for photophobia and pain.  Respiratory: Negative for cough, chest tightness and shortness of breath.   Cardiovascular: Negative for chest pain and leg swelling.  Gastrointestinal: Positive for diarrhea. Negative for nausea, vomiting, abdominal pain and constipation.  Endocrine: Negative for polydipsia and polyuria.  Genitourinary: Negative for dysuria, urgency, decreased urine volume and difficulty urinating.  Musculoskeletal: Negative for arthralgias, back pain and gait problem.  Skin: Negative for color change, rash and wound.  Allergic/Immunologic: Negative for immunocompromised state.  Neurological: Positive for weakness. Negative for dizziness, facial asymmetry, speech difficulty, numbness and headaches.  Psychiatric/Behavioral: Negative for confusion, decreased concentration and agitation.    Allergies  Review of patient's allergies indicates no known allergies.  Home Medications   Current Outpatient Rx  Name  Route  Sig  Dispense  Refill  . feeding supplement, ENSURE COMPLETE, (ENSURE COMPLETE) LIQD   Oral   Take 237 mLs by mouth 3 (three) times daily with meals.         . insulin NPH-regular (NOVOLIN 70/30) (70-30) 100 UNIT/ML injection   Subcutaneous   Inject 10 Units into the skin 2 (two) times daily with a meal.   10 mL   0   . lipase/protease/amylase (CREON-12/PANCREASE) 12000 UNITS CPEP capsule   Oral   Take 1 capsule by mouth 3 (three) times daily with meals.   120 capsule   0    BP 100/69  Pulse 89  Temp(Src) 98.4 F (36.9 C) (Oral)  Resp 17  Ht 5\' 8"  (1.727 m)  Wt 107 lb 2.3 oz (48.6 kg)  BMI 16.29 kg/m2  SpO2 100% Physical Exam  Constitutional: He is oriented to person, place, and time. He appears cachectic. He has a sickly appearance. No distress.  HENT:  Head: Normocephalic and atraumatic.   Mouth/Throat: No oropharyngeal exudate.  Eyes: Pupils are equal, round, and reactive to light.  Neck: Normal range of motion. Neck supple.  Cardiovascular: Normal rate, regular rhythm and normal heart sounds.  Exam reveals no gallop and no friction rub.   No murmur heard. Pulmonary/Chest: Effort normal and breath sounds normal. No respiratory distress. He has no wheezes. He has no rales.  Abdominal: Soft. Bowel sounds are normal. He exhibits distension. He exhibits no mass. There is no tenderness. There is no rebound and no guarding.  Musculoskeletal: Normal range of motion. He exhibits no edema and no tenderness.  Neurological: He is alert and oriented to person, place, and time.  Skin: Skin is warm and dry.  Psychiatric: He has a normal mood and affect.    ED Course  Procedures (including critical care time) Labs Review Labs Reviewed  CBC WITH DIFFERENTIAL - Abnormal; Notable for the following:    HCT 37.9 (*)    All other components within normal limits  COMPREHENSIVE METABOLIC PANEL - Abnormal; Notable for the following:    Sodium 132 (*)    Potassium 3.2 (*)    Glucose, Bld 244 (*)    AST 96 (*)    ALT 126 (*)    Alkaline Phosphatase 140 (*)    Total Bilirubin 0.2 (*)    All other components within normal limits  GLUCOSE, CAPILLARY - Abnormal; Notable for the following:    Glucose-Capillary 177 (*)    All other components within normal limits  CBC - Abnormal; Notable for the following:    RBC 4.01 (*)    Hemoglobin 12.4 (*)    HCT 35.8 (*)    All other components within normal limits  COMPREHENSIVE METABOLIC PANEL - Abnormal; Notable for the following:    Sodium 133 (*)    Glucose, Bld 216 (*)    BUN 5 (*)    Calcium 8.2 (*)    Total Protein 5.4 (*)    Albumin 3.0 (*)    AST 82 (*)    ALT 100 (*)    Alkaline Phosphatase 118 (*)    All other components within normal limits  GLUCOSE, CAPILLARY - Abnormal; Notable for the following:    Glucose-Capillary 223 (*)     All other components within normal limits  GLUCOSE, CAPILLARY - Abnormal; Notable for the following:    Glucose-Capillary 196 (*)    All other components within normal limits  BASIC METABOLIC PANEL - Abnormal; Notable for the following:    Sodium 129 (*)    Glucose, Bld 487 (*)    All other components within normal limits  CBC - Abnormal; Notable for the following:    RBC 3.85 (*)    Hemoglobin 11.8 (*)    HCT 34.6 (*)    All other components within normal limits  COMPREHENSIVE METABOLIC PANEL - Abnormal; Notable for the following:    Sodium 134 (*)    Glucose, Bld 138 (*)    Creatinine, Ser 0.49 (*)    Calcium 8.2 (*)    Total Protein 5.1 (*)    Albumin 2.9 (*)    AST 62 (*)  ALT 86 (*)    Total Bilirubin 0.2 (*)    All other components within normal limits  GLUCOSE, CAPILLARY - Abnormal; Notable for the following:    Glucose-Capillary 506 (*)    All other components within normal limits  GLUCOSE, CAPILLARY - Abnormal; Notable for the following:    Glucose-Capillary 283 (*)    All other components within normal limits  GLUCOSE, CAPILLARY - Abnormal; Notable for the following:    Glucose-Capillary 219 (*)    All other components within normal limits  GLUCOSE, CAPILLARY - Abnormal; Notable for the following:    Glucose-Capillary 34 (*)    All other components within normal limits  GLUCOSE, CAPILLARY - Abnormal; Notable for the following:    Glucose-Capillary 66 (*)    All other components within normal limits  GLUCOSE, CAPILLARY - Abnormal; Notable for the following:    Glucose-Capillary 191 (*)    All other components within normal limits  GLUCOSE, CAPILLARY - Abnormal; Notable for the following:    Glucose-Capillary 206 (*)    All other components within normal limits  GLUCOSE, CAPILLARY - Abnormal; Notable for the following:    Glucose-Capillary 353 (*)    All other components within normal limits  COMPREHENSIVE METABOLIC PANEL - Abnormal; Notable for the  following:    Sodium 133 (*)    Glucose, Bld 269 (*)    Calcium 8.1 (*)    Total Protein 5.3 (*)    Albumin 2.9 (*)    AST 52 (*)    ALT 77 (*)    Total Bilirubin 0.2 (*)    All other components within normal limits  GLUCOSE, CAPILLARY - Abnormal; Notable for the following:    Glucose-Capillary 302 (*)    All other components within normal limits  GLUCOSE, CAPILLARY - Abnormal; Notable for the following:    Glucose-Capillary 373 (*)    All other components within normal limits  GLUCOSE, CAPILLARY - Abnormal; Notable for the following:    Glucose-Capillary 314 (*)    All other components within normal limits  GLUCOSE, CAPILLARY - Abnormal; Notable for the following:    Glucose-Capillary 401 (*)    All other components within normal limits  GLUCOSE, RANDOM - Abnormal; Notable for the following:    Glucose, Bld 481 (*)    All other components within normal limits  GLUCOSE, CAPILLARY - Abnormal; Notable for the following:    Glucose-Capillary 241 (*)    All other components within normal limits  GLUCOSE, CAPILLARY - Abnormal; Notable for the following:    Glucose-Capillary 141 (*)    All other components within normal limits  COMPREHENSIVE METABOLIC PANEL - Abnormal; Notable for the following:    Sodium 132 (*)    Glucose, Bld 192 (*)    Total Protein 5.3 (*)    Albumin 3.0 (*)    AST 46 (*)    ALT 65 (*)    All other components within normal limits  CBC - Abnormal; Notable for the following:    RBC 3.94 (*)    Hemoglobin 12.1 (*)    HCT 35.7 (*)    All other components within normal limits  GLUCOSE, CAPILLARY - Abnormal; Notable for the following:    Glucose-Capillary 190 (*)    All other components within normal limits  GLUCOSE, CAPILLARY - Abnormal; Notable for the following:    Glucose-Capillary 290 (*)    All other components within normal limits  GLUCOSE, CAPILLARY - Abnormal; Notable for the following:  Glucose-Capillary 363 (*)    All other components within  normal limits  CLOSTRIDIUM DIFFICILE BY PCR  LIPASE, BLOOD  PROTIME-INR  FECAL LACTOFERRIN  RAPID HIV SCREEN ([facility]-MAU)  FECAL FAT, QUALITATIVE  HEMOGLOBIN A1C  AFP TUMOR MARKER  MAGNESIUM  CG4 I-STAT (LACTIC ACID)   Imaging Review No results found.  EKG Interpretation    Date/Time:    Ventricular Rate:    PR Interval:    QRS Duration:   QT Interval:    QTC Calculation:   R Axis:     Text Interpretation:              MDM   1. Dehydration   2. Diarrhea   3. Chronic pancreatitis   4. Diabetes mellitus   5. Liver lesion, right lobe   6. Hypokalemia   7. Malnutrition   8. Protein-calorie malnutrition, severe    Pt is a 47 y.o. male with Pmhx as above who presents with 3 months of daily steattorhea, generalized weakness, fatigue, 20 lb loss.  This is second visit for same. Pt has had neg cl diff, HIV, nml istat chem 8.  On PE, pt cachectic appearing, abdomen distended.  Given pt has hx of smoking & pancreatic pathology, there was concern for underlying malignancy.  He may also have underlying intestinal absorption abnormality. Pt has mild LFT/alk phose elevations as seen prior.  CT ab pelvis, CXR ordered.  Care transferred to Dr. Rubin Payor.        Shanna Cisco, MD 07/15/13 [phone]

## 2013-07-11 ENCOUNTER — Inpatient Hospital Stay (HOSPITAL_COMMUNITY): Payer: Self-pay

## 2013-07-11 DIAGNOSIS — E876 Hypokalemia: Secondary | ICD-10-CM | POA: Diagnosis present

## 2013-07-11 DIAGNOSIS — E43 Unspecified severe protein-calorie malnutrition: Secondary | ICD-10-CM | POA: Diagnosis present

## 2013-07-11 DIAGNOSIS — E46 Unspecified protein-calorie malnutrition: Secondary | ICD-10-CM

## 2013-07-11 LAB — BASIC METABOLIC PANEL
BUN: 6 mg/dL (ref 6–23)
CO2: 24 mEq/L (ref 19–32)
Chloride: 97 mEq/L (ref 96–112)
Creatinine, Ser: 0.5 mg/dL (ref 0.50–1.35)
GFR calc Af Amer: >90 mL/min (ref >90)
GFR calc non Af Amer: >90 mL/min (ref >90)
Glucose, Bld: 487 mg/dL — ABNORMAL HIGH (ref 70–99)
Potassium: 4.8 mEq/L (ref 3.5–5.1)

## 2013-07-11 LAB — GLUCOSE, CAPILLARY
Glucose-Capillary: 223 mg/dL — ABNORMAL HIGH (ref 70–99)
Glucose-Capillary: 283 mg/dL — ABNORMAL HIGH (ref 70–99)
Glucose-Capillary: 506 mg/dL — ABNORMAL HIGH (ref 70–99)

## 2013-07-11 LAB — COMPREHENSIVE METABOLIC PANEL
Albumin: 3 g/dL — ABNORMAL LOW (ref 3.5–5.2)
Alkaline Phosphatase: 118 U/L — ABNORMAL HIGH (ref 39–117)
BUN: 5 mg/dL — ABNORMAL LOW (ref 6–23)
Calcium: 8.2 mg/dL — ABNORMAL LOW (ref 8.4–10.5)
Chloride: 101 mEq/L (ref 96–112)
Creatinine, Ser: 0.54 mg/dL (ref 0.50–1.35)
GFR calc Af Amer: >90 mL/min (ref >90)
Glucose, Bld: 216 mg/dL — ABNORMAL HIGH (ref 70–99)
Potassium: 3.9 mEq/L (ref 3.5–5.1)
Total Bilirubin: 0.3 mg/dL (ref 0.3–1.2)
Total Protein: 5.4 g/dL — ABNORMAL LOW (ref 6.0–8.3)

## 2013-07-11 LAB — FECAL LACTOFERRIN, QUANT

## 2013-07-11 LAB — FECAL FAT, QUALITATIVE
Free fatty acids: INCREASED
Neutral Fat: INCREASED

## 2013-07-11 LAB — AFP TUMOR MARKER: AFP-Tumor Marker: <1.3 ng/mL (ref 0.0–8.0)

## 2013-07-11 LAB — CBC
HCT: 35.8 % — ABNORMAL LOW (ref 39.0–52.0)
Hemoglobin: 12.4 g/dL — ABNORMAL LOW (ref 13.0–17.0)
MCHC: 34.6 g/dL (ref 30.0–36.0)
MCV: 89.3 fL (ref 78.0–100.0)

## 2013-07-11 LAB — CLOSTRIDIUM DIFFICILE BY PCR: Toxigenic C. Difficile by PCR: NEGATIVE

## 2013-07-11 MED ORDER — PANCRELIPASE (LIP-PROT-AMYL) 12000-38000 UNITS PO CPEP
1.0000 | ORAL_CAPSULE | Freq: Three times a day (TID) | ORAL | Status: DC
  Administered 2013-07-12 – 2013-07-14 (×8): 1 via ORAL
  Filled 2013-07-11 (×11): qty 1

## 2013-07-11 MED ORDER — INSULIN ASPART 100 UNIT/ML ~~LOC~~ SOLN
22.0000 [IU] | Freq: Once | SUBCUTANEOUS | Status: AC
  Administered 2013-07-11: 22 [IU] via SUBCUTANEOUS

## 2013-07-11 MED ORDER — INSULIN GLARGINE 100 UNIT/ML ~~LOC~~ SOLN
10.0000 [IU] | Freq: Every day | SUBCUTANEOUS | Status: DC
  Administered 2013-07-11: 10 [IU] via SUBCUTANEOUS
  Filled 2013-07-11 (×2): qty 0.1

## 2013-07-11 MED ORDER — GADOXETATE DISODIUM 0.25 MMOL/ML IV SOLN
5.0000 mL | Freq: Once | INTRAVENOUS | Status: AC | PRN
  Administered 2013-07-11: 5 mL via INTRAVENOUS

## 2013-07-11 MED ORDER — INSULIN ASPART 100 UNIT/ML ~~LOC~~ SOLN
0.0000 [IU] | Freq: Three times a day (TID) | SUBCUTANEOUS | Status: DC
  Administered 2013-07-11: 21:00:00 via SUBCUTANEOUS
  Administered 2013-07-12: 15 [IU] via SUBCUTANEOUS
  Administered 2013-07-12: 11 [IU] via SUBCUTANEOUS
  Administered 2013-07-12 – 2013-07-13 (×2): 5 [IU] via SUBCUTANEOUS
  Administered 2013-07-13: 2 [IU] via SUBCUTANEOUS
  Administered 2013-07-14: 11 [IU] via SUBCUTANEOUS
  Administered 2013-07-14: 15 [IU] via SUBCUTANEOUS

## 2013-07-11 MED ORDER — ENSURE COMPLETE PO LIQD
237.0000 mL | Freq: Three times a day (TID) | ORAL | Status: DC
  Administered 2013-07-12 – 2013-07-14 (×7): 237 mL via ORAL

## 2013-07-11 NOTE — Progress Notes (Signed)
INITIAL NUTRITION ASSESSMENT  Pt meets criteria for severe MALNUTRITION in the context of chronic illness as evidenced by 24.6-27% weight loss in the past 3 months per pt report in addition to pt with severe muscle wasting and subcutaneous fat loss throughout body.  DOCUMENTATION CODES Per approved criteria  -Severe malnutrition in the context of chronic illness -Underweight   INTERVENTION: - Recommend pancreatic enzymes to help with diarrhea - Discussed ways to consume more calories/protein - Ensure Complete TID - Will continue to monitor   NUTRITION DIAGNOSIS: Altered GI function related to diarrhea after eating as evidenced by pt report.   Goal: 1. Resolution of diarrhea  2. Pt to consume >90% of meals/supplements  Monitor:  Weights, labs, intake, diarrhea  Reason for Assessment: Malnutrition screening tool, consult   47 y.o. male  Admitting Dx: Liver lesion, right lobe  ASSESSMENT: Pt is a 47 y.o. 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 He presents to the emergency room today with complaints of diarrhea for 3 months, profound weakness. He describes his stools as soft, 4-5 episodes per day, occasionally light colored, otherwise brown, no hematochezia or melena noted. He denies any nausea or vomiting/abdominal pain/ fever/ chills or jaundice etc. In addition he also reports losing: 35-40 pounds of weight in the last 3-6 months. In the ER noted to have hyponatremia, hypokalemia and CT abdomen pelvis with new enhancing right lobe liver lesion and decreased size of the known left lobe liver lesion. C. Difficile was negative.   Discussed pt with MD, who request consult for assessment of nutritional status. Pancreatic enzymes were recommended by RD to help with diarrhea as well as GI consult. Met with pt who reports 35-40 pound unintended weight loss  occurred in the past 3 months. Pt states he would have diarrhea between 20-30 minutes up to 1 hour after eating. Reports eating 3 meals/day at home - eggs/bacon/cereal for breakfast, bologna/lettuce/tomato sandwich with apple for lunch, and a well balanced dinner with some source of protein.   - Sodium low - Alk phos, AST/ALT elevated   Nutrition Focused Physical Exam:  Subcutaneous Fat:  Orbital Region: severe wasting Upper Arm Region: severe wasting Thoracic and Lumbar Region: severe wasting  Muscle:  Temple Region: severe wasting Clavicle Bone Region: severe wasting Clavicle and Acromion Bone Region: severe wasting Scapular Bone Region: NA Dorsal Hand: severe wasting Patellar Region: severe wasting Anterior Thigh Region: severe wasting Posterior Calf Region: severe wasting  Edema: None noted     Height: Ht Readings from Last 1 Encounters:  07/10/13 5\' 8"  (1.727 m)    Weight: Wt Readings from Last 1 Encounters:  07/10/13 107 lb 2.3 oz (48.6 kg)    Ideal Body Weight: 154 lb   % Ideal Body Weight: 69%  Wt Readings from Last 10 Encounters:  07/10/13 107 lb 2.3 oz (48.6 kg)  06/27/11 120 lb (54.432 kg)  06/27/11 120 lb (54.432 kg)    Usual Body Weight: 142-147 lb   % Usual Body Weight: 73-75%  BMI:  Body mass index is 16.29 kg/(m^2). underweight  Estimated Nutritional Needs: Kcal: 1700-1900 Protein: 75-90g Fluid: 1.7-1.9L/day  Skin: intact   Diet Order: Fat Restricted  EDUCATION NEEDS: -No education needs identified at this time   Intake/Output Summary (Last 24 hours) at 07/11/13 1202 Last data filed at 07/11/13 0825  Gross per 24 hour  Intake  840 ml  Output      0 ml  Net    840 ml    Last BM: 12/5   Labs:   Recent Labs Lab 07/10/13 1417 07/11/13 0530  NA 132* 133*  K 3.2* 3.9  CL 96 101  CO2 25 24  BUN 6 5*  CREATININE 0.57 0.54  CALCIUM 9.1 8.2*  MG  --  1.9  GLUCOSE 244* 216*    CBG (last 3)   Recent Labs   07/10/13 1455 07/11/13 0052 07/11/13 0749  GLUCAP 177* 223* 196*    Scheduled Meds: . enoxaparin (LOVENOX) injection  40 mg Subcutaneous Q24H  . insulin aspart  0-9 Units Subcutaneous TID WC    Continuous Infusions: . sodium chloride 75 mL/hr at 07/10/13 2132    Past Medical History  Diagnosis Date  . Pancreatitis   . Diabetes mellitus     Past Surgical History  Procedure Laterality Date  . Ercp w/ plastic stent placement  03/2011  . Ercp  06/27/2011    Procedure: ENDOSCOPIC RETROGRADE CHOLANGIOPANCREATOGRAPHY (ERCP);  Surgeon: Petra Kuba, MD;  Location: [facility];  Service: Endoscopy;  Laterality: N/A;    Levon Hedger MS, RD, LDN [phone] Pager [phone] After Hours Pager

## 2013-07-11 NOTE — Progress Notes (Signed)
[facility] PROGRESS NOTE  Rodney Barber ZOX:[id number] DOB: 31-Dec-1965 DOA: 07/10/2013 PCP: No PCP Per Patient  Assessment/Plan: #1 enhancing right lobe liver lesion Differential includes dysplastic nodule versus focal hyperplasia versus malignancy. Alpha-fetoprotein is pending. MRI of the abdomen is pending. If MRI is concern for lesion will likely need biopsy per IR. Follow.  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 MRI of the abdomen pending. Alkaline phosphatase was elevated however trended down. LFTs trending down. Will need outpatient followup with gastroenterology. Follow.  #3 hypokalemia/hyponatremia Secondary to GI losses. Potassium has been repleted. Magnesium level is 1.9. Sodium level slowly improving. Continue hydration.  #4 diabetes mellitus Untreated for greater than a year. Last hemoglobin A1c was 14.3. Hemoglobin A1c pending. CBGs have ranged from 177-223. Continue sliding scale insulin. Consult with diabetic coordinator.  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 C. difficile PCR pending. Stool studies pending. HIV is nonreactive. If C. difficile PCR is negative we'll place on pancreatic enzyme supplements. Follow.  #6 dehydration Continue hydration.  #7 malnutrition Patient is cachectic with temporal wasting. Patient states has had multiple stools per day usually after meals which may be the likely etiology. Patient states has had 35-40 pound weight loss in the last 3-6 months. CT of the abdomen and pelvis with abnormality noted in the liver. MRI of the abdomen pending for further evaluation. If C. difficile PCR is negative we'll place on pancreatic supplements. Nutrition consultation.  #8 chronic pancreatitis Stable. No abdominal pain. Patient however seems to be having multiple loose stools after  eating over the past 3-6 months. Once infectious workup is negative for stool studies, will place on pancreatic supplements.  #9 prophylaxis Lovenox for DVT prophylaxis.  Code Status: Full Family Communication: Updated patient no family at bedside. Disposition Plan: Home when medically stable.   Consultants:  None  Procedures:  CT of the abdomen and pelvis 07/10/2013  Chest x-ray 07/10/2013  Antibiotics:  None  HPI/Subjective: Patient with no nausea, no vomiting, no abdominal pain. Patient states over the past 3 months whenever he ate he felt the food went right through him and had multiple stools per day. Patient stated that 2 stools last night and one this morning.  Objective: Filed Vitals:   07/11/13 0623  BP: 106/69  Pulse: 58  Temp: 98.2 F (36.8 C)  Resp: 18    Intake/Output Summary (Last 24 hours) at 07/11/13 0929 Last data filed at 07/11/13 0825  Gross per 24 hour  Intake    840 ml  Output      0 ml  Net    840 ml   Filed Weights   07/10/13 2050  Weight: 48.6 kg (107 lb 2.3 oz)    Exam:   General:  Cachectic. Temporal wasting. NAD  Cardiovascular: Regular rate rhythm no murmurs rubs gallops  Respiratory: Clear to auscultation bilaterally. No wheezes, no crackles, no rhonchi.  Abdomen: Soft, nontender, nondistended, positive bowel sounds.  Musculoskeletal: No clubbing cyanosis or edema  Data Reviewed: Basic Metabolic Panel:  Recent Labs Lab 07/10/13 1417 07/11/13 0530  NA 132* 133*  K 3.2* 3.9  CL 96 101  CO2 25 24  GLUCOSE 244* 216*  BUN 6 5*  CREATININE 0.57 0.54  CALCIUM 9.1 8.2*  MG  --  1.9   Liver Function Tests:  Recent Labs Lab 07/10/13 1417 07/11/13 0530  AST 96*  82*  ALT 126* 100*  ALKPHOS 140* 118*  BILITOT 0.2* 0.3  PROT 6.3 5.4*  ALBUMIN 3.5 3.0*    Recent Labs Lab 07/10/13 1417  LIPASE 28   No results found for this basename: AMMONIA,  in the last 168 hours CBC:  Recent Labs Lab 07/10/13 1417  07/11/13 0530  WBC 7.1 7.4  NEUTROABS 3.9  --   HGB 13.4 12.4*  HCT 37.9* 35.8*  MCV 88.3 89.3  PLT 194 186   Cardiac Enzymes: No results found for this basename: CKTOTAL, CKMB, CKMBINDEX, TROPONINI,  in the last 168 hours BNP (last 3 results) No results found for this basename: PROBNP,  in the last 8760 hours CBG:  Recent Labs Lab 07/10/13 1455 07/11/13 0052 07/11/13 0749  GLUCAP 177* 223* 196*    Recent Results (from the past 240 hour(s))  CLOSTRIDIUM DIFFICILE BY PCR     Status: None   Collection Time    07/10/13 10:11 PM      Result Value Range Status   C difficile by pcr NEGATIVE  NEGATIVE Final   Comment: Performed at [facility]     Studies: Dg Chest 2 View  07/10/2013   CLINICAL DATA:  Week, weight loss, hypertension, smoking history  EXAM: CHEST  2 VIEW  COMPARISON:  Prior chest x-ray 12/08/2010  FINDINGS: Cardiac and mediastinal contours within normal limits. Atherosclerotic calcifications noted in the transverse aorta. The lungs are normally inflated. No suspicious pulmonary nodule, focal airspace consolidation, pleural effusion or pneumothorax. No acute osseous abnormality.  IMPRESSION: No active cardiopulmonary disease.  Aortic atherosclerosis.   Electronically Signed   By: Malachy Moan M.D.   On: 07/10/2013 17:38   Ct Abdomen Pelvis W Contrast  07/10/2013   CLINICAL DATA:  Fatigue, abdominal distention, chronic diarrhea  EXAM: CT ABDOMEN AND PELVIS WITH CONTRAST  TECHNIQUE: Multidetector CT imaging of the abdomen and pelvis was performed using the standard protocol following bolus administration of intravenous contrast.  CONTRAST:  80mL OMNIPAQUE IOHEXOL 300 MG/ML  SOLN  COMPARISON:  CT abdomen and pelvis Dec 09, 2010  FINDINGS: In the lateral segment left lobe liver, there is a 1.1 x 1.1 cm diffusely enhancing lesion slightly smaller compared to the previous CT. However, there is a new heterogeneously enhancing lesion in the posterior segment of right  lobe liver measuring 1.7 x 2.2 cm. This was not previously seen. No other focal liver lesion is identified.  The spleen is normal. There is a small gallstone versus focal gallbladder wall calcification. There are multiple calcifications in the pancreatic head and tail with marked pancreatic ductal dilatation consistent with changes of chronic pancreatitis. The previously described pseudocyst anterior to the pancreatic head is not seen today. The adrenal glands and kidneys are normal. There are mild prominent small bowel loops in the pelvis not changed which could be seen in the ileus. Moderate bowel content is noted throughout colon. There is atherosclerosis of the abdominal aorta without aneurysmal dilatation.  Fluid-filled bladder is normal. Degenerative joint changes of the spine are noted. The visualized lung bases demonstrate no focal pneumonia or pleural effusion.  IMPRESSION: Enhancing lesion in the left lobe liver slightly smaller compared to prior CT. However, there is a new heterogeneously enhancing lesion in the posterior segment right lobe liver measuring 1.7 x 2.2 cm. The enhancement pattern is not typical for hemangioma. Although the differential includes dysplastic nodule, focal nodular hyperplasia, hepatocellular carcinoma is not excluded.  Mild prominent small bowel loops in the  pelvis, can be seen ileus.   Electronically Signed   By: Sherian Rein M.D.   On: 07/10/2013 17:04    Scheduled Meds: . enoxaparin (LOVENOX) injection  40 mg Subcutaneous Q24H  . insulin aspart  0-9 Units Subcutaneous TID WC   Continuous Infusions: . sodium chloride 75 mL/hr at 07/10/13 2132    Principal Problem:   Liver lesion, right lobe Active Problems:   Chronic pancreatitis   Common bile duct (CBD) stricture   Diabetes mellitus   Weight loss   Diarrhea   Malnutrition   Hypokalemia    Time spent: 30 minutes    THOMPSON,DANIEL M.D. [facility] Pager [phone]. If 7PM-7AM, please contact  night-coverage at [email or web address], password [facility] 07/11/2013, 9:29 AM  LOS: 1 day

## 2013-07-11 NOTE — Evaluation (Signed)
Physical Therapy Evaluation-1x Patient Details Name: Rodney Barber MRN: [id number] DOB: 1966/06/17 Today's Date: 07/11/2013 Time: 4098-1191 PT Time Calculation (min): 9 min  PT Assessment / Plan / Recommendation History of Present Illness  47 yo male admitted with liver lesion, weakness, diarrhea.   Clinical Impression  On eval, pt was Ind with all mobility. No PT needs. 1x eval. Will sign off.     PT Assessment  Patent does not need any further PT services    Follow Up Recommendations  No PT follow up    Does the patient have the potential to tolerate intense rehabilitation      Barriers to Discharge        Equipment Recommendations  None recommended by PT    Recommendations for Other Services     Frequency      Precautions / Restrictions Precautions Precautions: None Restrictions Weight Bearing Restrictions: No   Pertinent Vitals/Pain No c/o pain      Mobility  Bed Mobility Bed Mobility: Supine to Sit;Sit to Supine Supine to Sit: 7: Independent Sit to Supine: 7: Independent Transfers Transfers: Sit to Stand;Stand to Sit Sit to Stand: 7: Independent Stand to Sit: 7: Independent Ambulation/Gait Ambulation/Gait Assistance: 7: Independent Ambulation Distance (Feet): 150 Feet Assistive device: None Gait Pattern: Within Functional Limits Stairs: No    Exercises     PT Diagnosis:    PT Problem List:   PT Treatment Interventions:       PT Goals(Current goals can be found in the care plan section) Acute Rehab PT Goals Patient Stated Goal: none stated PT Goal Formulation: No goals set, d/c therapy  Visit Information  Last PT Received On: 07/11/13 Assistance Needed: +1 History of Present Illness: 47 yo male admitted with liver lesion, weakness, diarrhea.        Prior Functioning  Home Living Family/patient expects to be discharged to:: Private residence Living Arrangements: Parent Type of Home: House Home Access: Stairs to enter Water quality scientist of Steps: 3 Entrance Stairs-Rails: Right Home Layout: One level Home Equipment: None Prior Function Level of Independence: Independent Communication Communication: No difficulties    Cognition  Cognition Arousal/Alertness: Awake/alert Behavior During Therapy: WFL for tasks assessed/performed Overall Cognitive Status: Within Functional Limits for tasks assessed    Extremity/Trunk Assessment Upper Extremity Assessment Upper Extremity Assessment: Overall WFL for tasks assessed Lower Extremity Assessment Lower Extremity Assessment: Overall WFL for tasks assessed Cervical / Trunk Assessment Cervical / Trunk Assessment: Normal   Balance    End of Session PT - End of Session Activity Tolerance: Patient tolerated treatment well Patient left: in bed;with call bell/phone within reach  GP     [name], MPT Pager: [phone]

## 2013-07-11 NOTE — Progress Notes (Signed)
Inpatient Diabetes Program Recommendations  AACE/ADA: New Consensus Statement on Inpatient Glycemic Control (2013)  Target Ranges:  Prepandial:   less than 140 mg/dL      Peak postprandial:   less than 180 mg/dL (1-2 hours)      Critically ill patients:  140 - 180 mg/dL   Reason for Visit: Diabetes Consult  47 y.o. 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 He also reports losing 35 - 40 pounds in past few months. He presents to the emergency room today with complaints of diarrhea for 3 months, profound weakness. Pt states he hasn't checked blood sugars recently and has taken no insulin in the past 2-3 months because he ran out. Needs PCP to manage DM.  Results for DAMYON, MULLANE (MRN [id number]) as of 07/11/2013 13:41  Ref. Range 07/11/2013 05:30  Sodium Latest Range: 135-145 mEq/L 133 (L)  Potassium Latest Range: 3.5-5.1 mEq/L 3.9  Chloride Latest Range: 96-112 mEq/L 101  CO2 Latest Range: 19-32 mEq/L 24  BUN Latest Range: 6-23 mg/dL 5 (L)  Creatinine Latest Range: 0.50-1.35 mg/dL 3.08  Calcium Latest Range: 8.4-10.5 mg/dL 8.2 (L)  GFR calc non Af Amer Latest Range: >90 mL/min >90  GFR calc Af Amer Latest Range: >90 mL/min >90  Glucose Latest Range: 70-99 mg/dL 657 (H)  Results for MITCH, ARQUETTE (MRN [id number]) as of 07/11/2013 13:41  Ref. Range 07/10/2013 14:55 07/11/2013 00:52 07/11/2013 07:49  Glucose-Capillary Latest Range: 70-99 mg/dL 841 (H) 324 (H) 401 (H)     Inpatient Diabetes Program Recommendations Insulin - Basal: Add Lantus 10 units QHS Correction (SSI): Increase Novolog to moderate tidwc and hs Insulin - Meal Coverage: Add Novolog 3 units tidwc if pt eats >50% meal HgbA1C: Has been ordered  Diet: Add CHO mod med to fat modified diet. Would like to switch to Glucerna since blood sugars are  uncontrolled.  Note: Will need case manager consult for assistance in getting PCP.  Pt states he has had diabetes education at [facility]. Will continue to follow.  Thank you. Ailene Ards, RD, LDN, CDE Inpatient Diabetes Coordinator [phone]

## 2013-07-11 NOTE — Progress Notes (Signed)
OT Cancellation Note  Patient Details Name: Cheskel Silverio MRN: [id number] DOB: 1966-02-11   Cancelled Treatment:    Reason Eval/Treat Not Completed: PT screened, no needs identified, will sign off  Jinelle Butchko 07/11/2013, 1:46 PM Marica Otter, OTR/L [phone] 07/11/2013

## 2013-07-11 NOTE — Care Management Note (Addendum)
    Page 1 of 2   07/15/2013     6:46:39 PM   CARE MANAGEMENT NOTE 07/15/2013  Patient:  Caraveo,Laval   Account Number:  [id number]  Date Initiated:  07/11/2013  Documentation initiated by:  Colleen Can  Subjective/Objective Assessment:   dx diarrhea, weakness; chronic pancreatitis    Needs PCP     Action/Plan:   CM went to interview patient. Pt is not in room but off floor for procedure.   Anticipated DC Date:  07/13/2013   Anticipated DC Plan:  HOME/SELF CARE  In-house referral  PCP / [facility]      DC Planning Services  CM consult  Follow-up appt scheduled  Follow-up appt scheduled  [facility]      Choice offered to / List presented to:             Status of service:  Completed, signed off Medicare Important Message given?   (If response is "NO", the following Medicare IM given date fields will be blank) Date Medicare IM given:   Date Additional Medicare IM given:    Discharge Disposition:  HOME/SELF CARE  Per UR Regulation:  Reviewed for med. necessity/level of care/duration of stay  If discussed at Long Length of Stay Meetings, dates discussed:    Comments:  07/15/2013 Colleen Can BSN RN CCM [phone] CM spoke with patient 07/14/2013. Plans are for patient to return to his home in [place] where he will get support from his brother. Pt was given follow up  appt information. Pt voices understanding of keeping appt with [facility] and establishing as patient there. wriiten information given regarding appt. Pt states he already has medication creon and will get prescription for insulin at [facility].  07/11/2013 Colleen Can BSN RN CCM [phone] Appt made for follow up appt at [facility] on 07/18/2013 on Friday at 5:15pm with Dr. Butler Denmark. Scheduler states patient should bring $20.00 copay and medications including empty bottles when he goes to appt. Will advise weekend CM that patient needs to be  advised of the above.

## 2013-07-12 LAB — COMPREHENSIVE METABOLIC PANEL
Albumin: 2.9 g/dL — ABNORMAL LOW (ref 3.5–5.2)
Alkaline Phosphatase: 109 U/L (ref 39–117)
BUN: 6 mg/dL (ref 6–23)
CO2: 25 mEq/L (ref 19–32)
Calcium: 8.2 mg/dL — ABNORMAL LOW (ref 8.4–10.5)
Creatinine, Ser: 0.49 mg/dL — ABNORMAL LOW (ref 0.50–1.35)
GFR calc Af Amer: >90 mL/min (ref >90)
Glucose, Bld: 138 mg/dL — ABNORMAL HIGH (ref 70–99)
Sodium: 134 mEq/L — ABNORMAL LOW (ref 135–145)
Total Bilirubin: 0.2 mg/dL — ABNORMAL LOW (ref 0.3–1.2)
Total Protein: 5.1 g/dL — ABNORMAL LOW (ref 6.0–8.3)

## 2013-07-12 LAB — CBC
HCT: 34.6 % — ABNORMAL LOW (ref 39.0–52.0)
Hemoglobin: 11.8 g/dL — ABNORMAL LOW (ref 13.0–17.0)
MCH: 30.6 pg (ref 26.0–34.0)
MCHC: 34.1 g/dL (ref 30.0–36.0)
MCV: 89.9 fL (ref 78.0–100.0)
RBC: 3.85 MIL/uL — ABNORMAL LOW (ref 4.22–5.81)
RDW: 13.8 % (ref 11.5–15.5)

## 2013-07-12 LAB — GLUCOSE, CAPILLARY
Glucose-Capillary: 191 mg/dL — ABNORMAL HIGH (ref 70–99)
Glucose-Capillary: 206 mg/dL — ABNORMAL HIGH (ref 70–99)
Glucose-Capillary: 353 mg/dL — ABNORMAL HIGH (ref 70–99)
Glucose-Capillary: 66 mg/dL — ABNORMAL LOW (ref 70–99)

## 2013-07-12 MED ORDER — POTASSIUM CHLORIDE CRYS ER 20 MEQ PO TBCR
40.0000 meq | EXTENDED_RELEASE_TABLET | Freq: Once | ORAL | Status: AC
  Administered 2013-07-12: 40 meq via ORAL
  Filled 2013-07-12: qty 2

## 2013-07-12 MED ORDER — INSULIN ASPART 100 UNIT/ML ~~LOC~~ SOLN
6.0000 [IU] | Freq: Once | SUBCUTANEOUS | Status: AC
  Administered 2013-07-12: 6 [IU] via SUBCUTANEOUS

## 2013-07-12 MED ORDER — GLUCOSE-VITAMIN C 4-6 GM-MG PO CHEW
CHEWABLE_TABLET | ORAL | Status: AC
  Administered 2013-07-12: 2
  Filled 2013-07-12: qty 2

## 2013-07-12 NOTE — Progress Notes (Signed)
[facility] PROGRESS NOTE  Rodney Barber WGN:[id number] DOB: 30-Jun-1966 DOA: 07/10/2013 PCP: No PCP Per Patient  Assessment/Plan: #1 enhancing right lobe liver lesion Differential includes dysplastic nodule versus focal hyperplasia versus malignancy. Alpha-fetoprotein is pending. MRI of the abdomen with left hepatic lobe lesion not identified. The right hepatic lobe lesion is most likely a perfusion abnormality. Outpatient followup.   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 MRI of the abdomen with changes of chronic calcific pancreatitis with market pancreatic ductal dilatation. Patient denies any stomach pain. Patient has been started on Creon. LFTs trending down. Will need to followup with GI as outpatient. Follow.  #3 hypokalemia/hyponatremia Secondary to GI losses. Potassium has been repleted. Magnesium level is 1.9. Sodium level slowly improving. Continue hydration.  #4 diabetes mellitus Untreated for greater than a year. Last hemoglobin A1c was 14.3. Hemoglobin A1c is 5.2. CBGs have ranged from 34-206. Continue sliding scale insulin. Discontinue Lantus.   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 C. difficile PCR is negative. Stool with moderate free fatty acids and moderate neutral fat. pending. HIV is nonreactive. Creon has been started. Monitor stools.   #6 dehydration Continue hydration.  #7 malnutrition Patient is cachectic with temporal wasting. Patient states has had multiple stools per day usually after meals which may be the likely etiology. Patient states has had 35-40 pound weight loss in the last 3-6 months. CT of the abdomen and pelvis with abnormality noted in the liver. MRI of the abdomen pending for further evaluation. C. difficile PCR is negative. Creon has been started. Patient has been seen by the  dietitian and has been started on some supplements.   #8 chronic pancreatitis Stable. No abdominal pain. Patient however seems to be having multiple loose stools after eating over the past 3-6 months. C. difficile PCR was negative. Creon has been started.  will need outpatient followup.  #9 prophylaxis Lovenox for DVT prophylaxis.  Code Status: Full Family Communication: Updated patient no family at bedside. Disposition Plan: Home when medically stable.    Consultants:  None  Procedures:  CT of the abdomen and pelvis 07/10/2013  Chest x-ray 07/10/2013  MRI of the abdomen 07/11/2013  Antibiotics:  None  HPI/Subjective: Patient with no nausea, no vomiting, no abdominal pain. Patient states over the past 3 months whenever he ate he felt the food went right through him and had multiple stools per day. Patient states had 2 bowel movements yesterday. Patient with one bowel movement today.   Objective: Filed Vitals:   07/12/13 0600  BP: 101/64  Pulse: 60  Temp: 98.3 F (36.8 C)  Resp: 18    Intake/Output Summary (Last 24 hours) at 07/12/13 1157 Last data filed at 07/12/13 0900  Gross per 24 hour  Intake 2817.5 ml  Output      0 ml  Net 2817.5 ml   Filed Weights   07/10/13 2050  Weight: 48.6 kg (107 lb 2.3 oz)    Exam:   General:  Cachectic. Temporal wasting. NAD  Cardiovascular: Regular rate rhythm no murmurs rubs gallops  Respiratory: Clear to auscultation bilaterally. No wheezes, no crackles, no rhonchi.  Abdomen: Soft, nontender, nondistended, positive bowel sounds.  Musculoskeletal: No clubbing cyanosis or edema  Data Reviewed: Basic Metabolic Panel:  Recent Labs Lab 07/10/13 1417 07/11/13 0530 07/11/13 1244 07/12/13 0511  NA 132* 133* 129* 134*  K 3.2*  3.9 4.8 3.5  CL 96 101 97 101  CO2 25 24 24 25   GLUCOSE 244* 216* 487* 138*  BUN 6 5* 6 6  CREATININE 0.57 0.54 0.50 0.49*  CALCIUM 9.1 8.2* 8.5 8.2*  MG  --  1.9  --   --    Liver  Function Tests:  Recent Labs Lab 07/10/13 1417 07/11/13 0530 07/12/13 0511  AST 96* 82* 62*  ALT 126* 100* 86*  ALKPHOS 140* 118* 109  BILITOT 0.2* 0.3 0.2*  PROT 6.3 5.4* 5.1*  ALBUMIN 3.5 3.0* 2.9*    Recent Labs Lab 07/10/13 1417  LIPASE 28   No results found for this basename: AMMONIA,  in the last 168 hours CBC:  Recent Labs Lab 07/10/13 1417 07/11/13 0530 07/12/13 0511  WBC 7.1 7.4 7.5  NEUTROABS 3.9  --   --   HGB 13.4 12.4* 11.8*  HCT 37.9* 35.8* 34.6*  MCV 88.3 89.3 89.9  PLT 194 186 181   Cardiac Enzymes: No results found for this basename: CKTOTAL, CKMB, CKMBINDEX, TROPONINI,  in the last 168 hours BNP (last 3 results) No results found for this basename: PROBNP,  in the last 8760 hours CBG:  Recent Labs Lab 07/11/13 2354 07/12/13 0022 07/12/13 0059 07/12/13 0721 07/12/13 1120  GLUCAP 34* 66* 191* 206* 353*    Recent Results (from the past 240 hour(s))  CLOSTRIDIUM DIFFICILE BY PCR     Status: None   Collection Time    07/10/13 10:11 PM      Result Value Range Status   C difficile by pcr NEGATIVE  NEGATIVE Final   Comment: Performed at [facility]     Studies: Dg Chest 2 View  07/10/2013   CLINICAL DATA:  Week, weight loss, hypertension, smoking history  EXAM: CHEST  2 VIEW  COMPARISON:  Prior chest x-ray 12/08/2010  FINDINGS: Cardiac and mediastinal contours within normal limits. Atherosclerotic calcifications noted in the transverse aorta. The lungs are normally inflated. No suspicious pulmonary nodule, focal airspace consolidation, pleural effusion or pneumothorax. No acute osseous abnormality.  IMPRESSION: No active cardiopulmonary disease.  Aortic atherosclerosis.   Electronically Signed   By: Malachy Moan M.D.   On: 07/10/2013 17:38   Mr Abdomen W Wo Contrast  07/11/2013   CLINICAL DATA:  Evaluate liver lesions.  EXAM: MRI ABDOMEN WITHOUT AND WITH CONTRAST  TECHNIQUE: Multiplanar multisequence MR imaging of the abdomen was  performed both before and after the administration of intravenous contrast.  CONTRAST:  5 mL Eovist  COMPARISON:  CT scan 07/10/2013.  FINDINGS: Examination is quite limited due to breathing motion artifact. I do not identified the lateral segment left hepatic lobe lesion on any of the imaging sequences. On the postcontrast enhanced images there is faint enhancement near the right kidney which correlates with the abnormality seen on CT scan. This disappears quickly on weighted sequences and is most likely some type of perfusion abnormality.  Stable changes of chronic calcific pancreatitis with markedly dilated pancreatic duct. The gallbladder is mildly distended. No inflammatory changes.  The spleen is normal in size. The adrenal glands and kidneys are normal. The aorta and branch vessels are normal. The colon is distended with stool in air.  IMPRESSION: Limited examination.  The left hepatic lobe lesion is not identified. The right hepatic lobe lesion is most likely a perfusion abnormality.  Changes of chronic calcific pancreatitis with marked pancreatic ductal dilatation.   Electronically Signed   By: Luan Pulling.D.  On: 07/11/2013 18:37   Ct Abdomen Pelvis W Contrast  07/10/2013   CLINICAL DATA:  Fatigue, abdominal distention, chronic diarrhea  EXAM: CT ABDOMEN AND PELVIS WITH CONTRAST  TECHNIQUE: Multidetector CT imaging of the abdomen and pelvis was performed using the standard protocol following bolus administration of intravenous contrast.  CONTRAST:  80mL OMNIPAQUE IOHEXOL 300 MG/ML  SOLN  COMPARISON:  CT abdomen and pelvis Dec 09, 2010  FINDINGS: In the lateral segment left lobe liver, there is a 1.1 x 1.1 cm diffusely enhancing lesion slightly smaller compared to the previous CT. However, there is a new heterogeneously enhancing lesion in the posterior segment of right lobe liver measuring 1.7 x 2.2 cm. This was not previously seen. No other focal liver lesion is identified.  The spleen is normal.  There is a small gallstone versus focal gallbladder wall calcification. There are multiple calcifications in the pancreatic head and tail with marked pancreatic ductal dilatation consistent with changes of chronic pancreatitis. The previously described pseudocyst anterior to the pancreatic head is not seen today. The adrenal glands and kidneys are normal. There are mild prominent small bowel loops in the pelvis not changed which could be seen in the ileus. Moderate bowel content is noted throughout colon. There is atherosclerosis of the abdominal aorta without aneurysmal dilatation.  Fluid-filled bladder is normal. Degenerative joint changes of the spine are noted. The visualized lung bases demonstrate no focal pneumonia or pleural effusion.  IMPRESSION: Enhancing lesion in the left lobe liver slightly smaller compared to prior CT. However, there is a new heterogeneously enhancing lesion in the posterior segment right lobe liver measuring 1.7 x 2.2 cm. The enhancement pattern is not typical for hemangioma. Although the differential includes dysplastic nodule, focal nodular hyperplasia, hepatocellular carcinoma is not excluded.  Mild prominent small bowel loops in the pelvis, can be seen ileus.   Electronically Signed   By: Sherian Rein M.D.   On: 07/10/2013 17:04    Scheduled Meds: . enoxaparin (LOVENOX) injection  40 mg Subcutaneous Q24H  . feeding supplement (ENSURE COMPLETE)  237 mL Oral TID WC  . insulin aspart  0-15 Units Subcutaneous TID WC  . insulin glargine  10 Units Subcutaneous QHS  . lipase/protease/amylase  1 capsule Oral TID WC   Continuous Infusions: . sodium chloride 75 mL/hr at 07/12/13 0900    Principal Problem:   Liver lesion, right lobe Active Problems:   Chronic pancreatitis   Common bile duct (CBD) stricture   Diabetes mellitus   Weight loss   Diarrhea   Hypokalemia   Protein-calorie malnutrition, severe    Time spent: 30 minutes    THOMPSON,DANIEL M.D. [facility] Pager [phone]. If 7PM-7AM, please contact night-coverage at [email or web address], password [facility] 07/12/2013, 11:57 AM  LOS: 2 days            And

## 2013-07-12 NOTE — Progress Notes (Signed)
Hypoglycemic event: CBG 34 orange juice and snack given, rechecked CBG 66 2 glucose tabs given, rechecked CBG 191

## 2013-07-13 DIAGNOSIS — E43 Unspecified severe protein-calorie malnutrition: Secondary | ICD-10-CM

## 2013-07-13 LAB — GLUCOSE, CAPILLARY
Glucose-Capillary: 401 mg/dL — ABNORMAL HIGH (ref 70–99)
Glucose-Capillary: 241 mg/dL — ABNORMAL HIGH (ref 70–99)
Glucose-Capillary: 141 mg/dL — ABNORMAL HIGH (ref 70–99)
Glucose-Capillary: 190 mg/dL — ABNORMAL HIGH (ref 70–99)

## 2013-07-13 LAB — COMPREHENSIVE METABOLIC PANEL
ALT: 77 U/L — ABNORMAL HIGH (ref 0–53)
AST: 52 U/L — ABNORMAL HIGH (ref 0–37)
Albumin: 2.9 g/dL — ABNORMAL LOW (ref 3.5–5.2)
Alkaline Phosphatase: 102 U/L (ref 39–117)
Calcium: 8.1 mg/dL — ABNORMAL LOW (ref 8.4–10.5)
Chloride: 101 mEq/L (ref 96–112)
GFR calc Af Amer: >90 mL/min (ref >90)
Potassium: 3.9 mEq/L (ref 3.5–5.1)
Sodium: 133 mEq/L — ABNORMAL LOW (ref 135–145)
Total Bilirubin: 0.2 mg/dL — ABNORMAL LOW (ref 0.3–1.2)
Total Protein: 5.3 g/dL — ABNORMAL LOW (ref 6.0–8.3)

## 2013-07-13 LAB — GLUCOSE, RANDOM: Glucose, Bld: 481 mg/dL — ABNORMAL HIGH (ref 70–99)

## 2013-07-13 MED ORDER — INSULIN GLARGINE 100 UNIT/ML ~~LOC~~ SOLN
5.0000 [IU] | SUBCUTANEOUS | Status: DC
  Administered 2013-07-13: 5 [IU] via SUBCUTANEOUS
  Filled 2013-07-13: qty 0.05

## 2013-07-13 MED ORDER — INSULIN ASPART 100 UNIT/ML ~~LOC~~ SOLN
3.0000 [IU] | Freq: Three times a day (TID) | SUBCUTANEOUS | Status: DC
Start: 2013-07-13 — End: 2013-07-14
  Administered 2013-07-13 – 2013-07-14 (×5): 3 [IU] via SUBCUTANEOUS

## 2013-07-13 MED ORDER — INSULIN GLARGINE 100 UNIT/ML ~~LOC~~ SOLN
5.0000 [IU] | Freq: Every day | SUBCUTANEOUS | Status: DC
  Administered 2013-07-13 – 2013-07-14 (×2): 5 [IU] via SUBCUTANEOUS
  Filled 2013-07-13 (×2): qty 0.05

## 2013-07-13 MED ORDER — INSULIN ASPART 100 UNIT/ML ~~LOC~~ SOLN
20.0000 [IU] | Freq: Once | SUBCUTANEOUS | Status: AC
  Administered 2013-07-13: 20 [IU] via SUBCUTANEOUS

## 2013-07-13 NOTE — Progress Notes (Signed)
[facility] PROGRESS NOTE  Rodney Barber RUE:[id number] DOB: 1966/02/20 DOA: 07/10/2013 PCP: No PCP Per Patient  Assessment/Plan: #1 enhancing right lobe liver lesion Differential includes dysplastic nodule versus focal hyperplasia versus malignancy. Alpha-fetoprotein is pending. MRI of the abdomen with left hepatic lobe lesion not identified. The right hepatic lobe lesion is most likely a perfusion abnormality. Outpatient followup.   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 MRI of the abdomen with changes of chronic calcific pancreatitis with market pancreatic ductal dilatation. Patient denies any stomach pain. Patient has been started on Creon. LFTs trending down. Will need to followup with GI as outpatient. Follow.  #3 hypokalemia/hyponatremia Secondary to GI losses. Potassium has been repleted. Magnesium level is 1.9. Sodium level slowly improving. Continue hydration.  #4 diabetes mellitus Untreated for greater than a year. Last hemoglobin A1c was 14.3. Hemoglobin A1c is 5.2. CBGs have ranged from 314-401. Continue sliding scale insulin. Resume Lantus at 5 units daily. Place a meal coverage insulin. Sliding scale insulin.   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 C. difficile PCR is negative. Stool with moderate free fatty acids and moderate neutral fat. HIV is nonreactive. Creon has been started. Monitor stools.   #6 dehydration Continue hydration.  #7 malnutrition Patient is cachectic with temporal wasting. Patient states has had multiple stools per day usually after meals which may be the likely etiology. Patient states has had 35-40 pound weight loss in the last 3-6 months. CT of the abdomen and pelvis with abnormality noted in the liver. MRI of the abdomen pending for further evaluation. C. difficile PCR is  negative. Creon has been started. Patient has been seen by the dietitian and has been started on some supplements.   #8 chronic pancreatitis Stable. No abdominal pain. Patient however seems to be having multiple loose stools after eating over the past 3-6 months. C. difficile PCR was negative. Creon has been started.  will need outpatient followup.  #9 prophylaxis Lovenox for DVT prophylaxis.  Code Status: Full Family Communication: Updated patient no family at bedside. Disposition Plan: Home when medically stable hopefully 1-2 days.   Consultants:  None  Procedures:  CT of the abdomen and pelvis 07/10/2013  Chest x-ray 07/10/2013  MRI of the abdomen 07/11/2013  Antibiotics:  None  HPI/Subjective: Patient with no nausea, no vomiting, no abdominal pain. Patient states over the past 3 months whenever he ate he felt the food went right through him and had multiple stools per day. Patient states had 2 bowel movements yesterday.    Objective: Filed Vitals:   07/13/13 0529  BP: 120/72  Pulse: 58  Temp: 98.1 F (36.7 C)  Resp: 20    Intake/Output Summary (Last 24 hours) at 07/13/13 1115 Last data filed at 07/13/13 1000  Gross per 24 hour  Intake   3082 ml  Output      0 ml  Net   3082 ml   Filed Weights   07/10/13 2050  Weight: 48.6 kg (107 lb 2.3 oz)    Exam:   General:  Cachectic. Temporal wasting. NAD  Cardiovascular: Regular rate rhythm no murmurs rubs gallops  Respiratory: Clear to auscultation bilaterally. No wheezes, no crackles, no rhonchi.  Abdomen: Soft, nontender, nondistended, positive bowel sounds.  Musculoskeletal: No clubbing cyanosis or edema  Data Reviewed: Basic Metabolic Panel:  Recent Labs Lab 07/10/13 1417 07/11/13 0530 07/11/13  1244 07/12/13 0511 07/13/13 0520 07/13/13 0800  NA 132* 133* 129* 134* 133*  --   K 3.2* 3.9 4.8 3.5 3.9  --   CL 96 101 97 101 101  --   CO2 25 24 24 25 23   --   GLUCOSE 244* 216* 487* 138* 269*  481*  BUN 6 5* 6 6 8   --   CREATININE 0.57 0.54 0.50 0.49* 0.57  --   CALCIUM 9.1 8.2* 8.5 8.2* 8.1*  --   MG  --  1.9  --   --   --   --    Liver Function Tests:  Recent Labs Lab 07/10/13 1417 07/11/13 0530 07/12/13 0511 07/13/13 0520  AST 96* 82* 62* 52*  ALT 126* 100* 86* 77*  ALKPHOS 140* 118* 109 102  BILITOT 0.2* 0.3 0.2* 0.2*  PROT 6.3 5.4* 5.1* 5.3*  ALBUMIN 3.5 3.0* 2.9* 2.9*    Recent Labs Lab 07/10/13 1417  LIPASE 28   No results found for this basename: AMMONIA,  in the last 168 hours CBC:  Recent Labs Lab 07/10/13 1417 07/11/13 0530 07/12/13 0511  WBC 7.1 7.4 7.5  NEUTROABS 3.9  --   --   HGB 13.4 12.4* 11.8*  HCT 37.9* 35.8* 34.6*  MCV 88.3 89.3 89.9  PLT 194 186 181   Cardiac Enzymes: No results found for this basename: CKTOTAL, CKMB, CKMBINDEX, TROPONINI,  in the last 168 hours BNP (last 3 results) No results found for this basename: PROBNP,  in the last 8760 hours CBG:  Recent Labs Lab 07/12/13 1120 07/12/13 1700 07/12/13 2020 07/12/13 2357 07/13/13 0726  GLUCAP 353* 302* 373* 314* 401*    Recent Results (from the past 240 hour(s))  CLOSTRIDIUM DIFFICILE BY PCR     Status: None   Collection Time    07/10/13 10:11 PM      Result Value Range Status   C difficile by pcr NEGATIVE  NEGATIVE Final   Comment: Performed at [facility]     Studies: Mr Abdomen W Wo Contrast  07/11/2013   CLINICAL DATA:  Evaluate liver lesions.  EXAM: MRI ABDOMEN WITHOUT AND WITH CONTRAST  TECHNIQUE: Multiplanar multisequence MR imaging of the abdomen was performed both before and after the administration of intravenous contrast.  CONTRAST:  5 mL Eovist  COMPARISON:  CT scan 07/10/2013.  FINDINGS: Examination is quite limited due to breathing motion artifact. I do not identified the lateral segment left hepatic lobe lesion on any of the imaging sequences. On the postcontrast enhanced images there is faint enhancement near the right kidney which  correlates with the abnormality seen on CT scan. This disappears quickly on weighted sequences and is most likely some type of perfusion abnormality.  Stable changes of chronic calcific pancreatitis with markedly dilated pancreatic duct. The gallbladder is mildly distended. No inflammatory changes.  The spleen is normal in size. The adrenal glands and kidneys are normal. The aorta and branch vessels are normal. The colon is distended with stool in air.  IMPRESSION: Limited examination.  The left hepatic lobe lesion is not identified. The right hepatic lobe lesion is most likely a perfusion abnormality.  Changes of chronic calcific pancreatitis with marked pancreatic ductal dilatation.   Electronically Signed   By: Loralie Champagne M.D.   On: 07/11/2013 18:37    Scheduled Meds: . enoxaparin (LOVENOX) injection  40 mg Subcutaneous Q24H  . feeding supplement (ENSURE COMPLETE)  237 mL Oral TID WC  . insulin  aspart  0-15 Units Subcutaneous TID WC  . insulin aspart  3 Units Subcutaneous TID WC  . insulin glargine  5 Units Subcutaneous Q breakfast  . lipase/protease/amylase  1 capsule Oral TID WC   Continuous Infusions:    Principal Problem:   Liver lesion, right lobe Active Problems:   Chronic pancreatitis   Common bile duct (CBD) stricture   Diabetes mellitus   Weight loss   Diarrhea   Hypokalemia   Protein-calorie malnutrition, severe    Time spent: 30 minutes    Judia Arnott M.D. [facility] Pager [phone]. If 7PM-7AM, please contact night-coverage at [email or web address], password [facility] 07/13/2013, 11:15 AM  LOS: 3 days            And

## 2013-07-14 LAB — CBC
MCH: 30.7 pg (ref 26.0–34.0)
MCHC: 33.9 g/dL (ref 30.0–36.0)
MCV: 90.6 fL (ref 78.0–100.0)
Platelets: 168 10*3/uL (ref 150–400)
RDW: 13.7 % (ref 11.5–15.5)
WBC: 7 10*3/uL (ref 4.0–10.5)

## 2013-07-14 LAB — COMPREHENSIVE METABOLIC PANEL
ALT: 65 U/L — ABNORMAL HIGH (ref 0–53)
AST: 46 U/L — ABNORMAL HIGH (ref 0–37)
Albumin: 3 g/dL — ABNORMAL LOW (ref 3.5–5.2)
Alkaline Phosphatase: 95 U/L (ref 39–117)
CO2: 26 mEq/L (ref 19–32)
Chloride: 97 mEq/L (ref 96–112)
Potassium: 3.6 mEq/L (ref 3.5–5.1)
Total Bilirubin: 0.3 mg/dL (ref 0.3–1.2)

## 2013-07-14 LAB — GLUCOSE, CAPILLARY
Glucose-Capillary: 290 mg/dL — ABNORMAL HIGH (ref 70–99)
Glucose-Capillary: 363 mg/dL — ABNORMAL HIGH (ref 70–99)

## 2013-07-14 MED ORDER — ENSURE COMPLETE PO LIQD: 237.0000 mL | Freq: Three times a day (TID) | ORAL | Status: DC

## 2013-07-14 MED ORDER — INSULIN NPH ISOPHANE & REGULAR (70-30) 100 UNIT/ML ~~LOC~~ SUSP: 10.0000 [IU] | Freq: Two times a day (BID) | SUBCUTANEOUS | Status: DC

## 2013-07-14 MED ORDER — PANCRELIPASE (LIP-PROT-AMYL) 12000-38000 UNITS PO CPEP: 1.0000 | ORAL_CAPSULE | Freq: Three times a day (TID) | ORAL | Status: DC

## 2013-07-14 NOTE — Discharge Summary (Signed)
Physician Discharge Summary  Rodney Barber WUJ:[id number] DOB: 1966-01-21 DOA: 07/10/2013  PCP: No PCP Per Patient  Admit date: 07/10/2013 Discharge date: 07/14/2013  Time spent: 65 minutes  Recommendations for Outpatient Follow-up:  1. [facility] Dr. Butler Denmark on 07/18/2013 at 5:15 PM. On followup at comprehensive metabolic profile need to be obtained to followup on patient's electrolytes renal function and to follow his LFTs. 2. Patient to schedule followup appointment with Dr. Ewing Schlein of gastroenterology in 2 weeks.  Discharge Diagnoses:  Principal Problem:   Liver lesion, right lobe Active Problems:   Chronic pancreatitis   Common bile duct (CBD) stricture   Diabetes mellitus   Weight loss   Diarrhea   Hypokalemia   Protein-calorie malnutrition, severe   Discharge Condition: Stable and improved  Diet recommendation: Carb modified  Filed Weights   07/10/13 2050  Weight: 48.6 kg (107 lb 2.3 oz)    History of present illness:  Rodney Barber is a 46 y.o. 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  He presents to the emergency room today with complaints of diarrhea for 3 months, profound weakness.  He describes his stools as soft, 4-5 episodes per day, occasionally light colored, otherwise brown, no hematochezia or melena noted.  He denies any nausea or vomiting/abdominal pain/ fever/ chills or jaundice etc.  In addition he also reports losing: 35-40 pounds of weight in the last 3-6 months.  In the ER noted to have hyponatremia, hypokalemia and CT abdomen pelvis with new enhancing right lobe liver lesion and decreased size of the known left lobe liver lesion   Hospital Course:  #1 enhancing right lobe liver lesion  Differential includes dysplastic nodule versus focal hyperplasia versus malignancy. Alpha-fetoprotein  was negative. MRI of the abdomen with left hepatic lobe lesion not identified. The right hepatic lobe lesion is most likely a perfusion abnormality. Outpatient followup.  #2 history of common bile duct stricture/prior stent  CT of the abdomen and pelvis with no ductal dilatation noted. MRI of the abdomen with changes of chronic calcific pancreatitis with market pancreatic ductal dilatation. Patient denies any stomach pain. Patient has been started on Creon. LFTs trending down. Will need to followup with GI as outpatient. Follow.  #3 hypokalemia/hyponatremia  Secondary to GI losses. Potassium has been repleted. Magnesium level is 1.9. Sodium level improved with hydration.  #4 diabetes mellitus  Untreated for greater than a year. Last hemoglobin A1c was 14.3. Hemoglobin A1c is 5.2. CBGs have ranged from 314-401. Patient was maintained on sliding scale insulin. Patient was initially placed on Lantus 10 units however had a hypoglycemic spell and this was decreased to 5 units daily. Patient will be discharged on 70 3010 units twice daily. Patient will followup with PCP as outpatient.  #5 chronic diarrhea/multiple loose stools with weight loss  Likely secondary to malabsorption from history of chronic pancreatitis. C. difficile PCR is negative. Stool with moderate free fatty acids and moderate neutral fat. HIV is nonreactive. Creon has been started with improvement in stools. Outpatient followup. #6 dehydration  Patient was hydrated with IV fluids and was euvolemic per day of discharge.  #7 malnutrition  Patient is cachectic with temporal wasting. Patient states has had multiple stools per day usually after meals which may be the likely etiology. Patient states has had 35-40 pound weight loss in the last 3-6 months. CT of the  abdomen and pelvis with abnormality noted in the liver. MRI of the abdomen was negative for any lesions. C. difficile PCR is negative. Creon has been started. Patient has been seen by the  dietitian and has been started on some supplements.  #8 chronic pancreatitis  Stable. No abdominal pain. Patient however seems to be having multiple loose stools after eating over the past 3-6 months. C. difficile PCR was negative. Creon has been started. Stool is improved. Will need to followup with GI as outpatient.     Procedures: CT of the abdomen and pelvis 07/10/2013  Chest x-ray 07/10/2013  MRI of the abdomen 07/11/2013     Consultations:  None  Discharge Exam: Filed Vitals:   07/14/13 0538  BP: 107/71  Pulse: 59  Temp: 97.7 F (36.5 C)  Resp: 16    General: NAD Cardiovascular: RRR Respiratory: CTAB  Discharge Instructions  Discharge Orders   Future Appointments Provider Department Dept Phone   07/18/2013 5:15 PM [facility] Covering Provider [facility] [phone]   Future Orders Complete By Expires   Diet Carb Modified  As directed    Discharge instructions  As directed    Comments:     Follow up at [facility] with Dr Butler Denmark on 07/18/13 at 515pm   Increase activity slowly  As directed        Medication List         feeding supplement (ENSURE COMPLETE) Liqd  Take 237 mLs by mouth 3 (three) times daily with meals.     insulin NPH-regular (70-30) 100 UNIT/ML injection  Commonly known as:  NOVOLIN 70/30  Inject 10 Units into the skin 2 (two) times daily with a meal.     lipase/protease/amylase 25366 UNITS Cpep capsule  Commonly known as:  CREON-12/PANCREASE  Take 1 capsule by mouth 3 (three) times daily with meals.       No Known Allergies     Follow-up Information   Follow up with [facility]     On 07/18/2013. (f/u at 515pm with Dr Butler Denmark)    Contact information:   [address] [place] [address] [phone]      Follow up with [facility], MD. Schedule an appointment as soon as possible for a visit in 2 weeks. (f/u in 2-3 weeks)    Specialty:   Gastroenterology   Contact information:   [address] [place] [address] [phone]        The results of significant diagnostics from this hospitalization (including imaging, microbiology, ancillary and laboratory) are listed below for reference.    Significant Diagnostic Studies: Dg Chest 2 View  07/10/2013   CLINICAL DATA:  Week, weight loss, hypertension, smoking history  EXAM: CHEST  2 VIEW  COMPARISON:  Prior chest x-ray 12/08/2010  FINDINGS: Cardiac and mediastinal contours within normal limits. Atherosclerotic calcifications noted in the transverse aorta. The lungs are normally inflated. No suspicious pulmonary nodule, focal airspace consolidation, pleural effusion or pneumothorax. No acute osseous abnormality.  IMPRESSION: No active cardiopulmonary disease.  Aortic atherosclerosis.   Electronically Signed   By: Malachy Moan M.D.   On: 07/10/2013 17:38   Mr Abdomen W Wo Contrast  07/11/2013   CLINICAL DATA:  Evaluate liver lesions.  EXAM: MRI ABDOMEN WITHOUT AND WITH CONTRAST  TECHNIQUE: Multiplanar multisequence MR imaging of the abdomen was performed both before and after the administration of intravenous contrast.  CONTRAST:  5  mL Eovist  COMPARISON:  CT scan 07/10/2013.  FINDINGS: Examination is quite limited due to breathing motion artifact. I do not identified the lateral segment left hepatic lobe lesion on any of the imaging sequences. On the postcontrast enhanced images there is faint enhancement near the right kidney which correlates with the abnormality seen on CT scan. This disappears quickly on weighted sequences and is most likely some type of perfusion abnormality.  Stable changes of chronic calcific pancreatitis with markedly dilated pancreatic duct. The gallbladder is mildly distended. No inflammatory changes.  The spleen is normal in size. The adrenal glands and kidneys are normal. The aorta and branch vessels are normal. The colon is distended with  stool in air.  IMPRESSION: Limited examination.  The left hepatic lobe lesion is not identified. The right hepatic lobe lesion is most likely a perfusion abnormality.  Changes of chronic calcific pancreatitis with marked pancreatic ductal dilatation.   Electronically Signed   By: Loralie Champagne M.D.   On: 07/11/2013 18:37   Ct Abdomen Pelvis W Contrast  07/10/2013   CLINICAL DATA:  Fatigue, abdominal distention, chronic diarrhea  EXAM: CT ABDOMEN AND PELVIS WITH CONTRAST  TECHNIQUE: Multidetector CT imaging of the abdomen and pelvis was performed using the standard protocol following bolus administration of intravenous contrast.  CONTRAST:  80mL OMNIPAQUE IOHEXOL 300 MG/ML  SOLN  COMPARISON:  CT abdomen and pelvis Dec 09, 2010  FINDINGS: In the lateral segment left lobe liver, there is a 1.1 x 1.1 cm diffusely enhancing lesion slightly smaller compared to the previous CT. However, there is a new heterogeneously enhancing lesion in the posterior segment of right lobe liver measuring 1.7 x 2.2 cm. This was not previously seen. No other focal liver lesion is identified.  The spleen is normal. There is a small gallstone versus focal gallbladder wall calcification. There are multiple calcifications in the pancreatic head and tail with marked pancreatic ductal dilatation consistent with changes of chronic pancreatitis. The previously described pseudocyst anterior to the pancreatic head is not seen today. The adrenal glands and kidneys are normal. There are mild prominent small bowel loops in the pelvis not changed which could be seen in the ileus. Moderate bowel content is noted throughout colon. There is atherosclerosis of the abdominal aorta without aneurysmal dilatation.  Fluid-filled bladder is normal. Degenerative joint changes of the spine are noted. The visualized lung bases demonstrate no focal pneumonia or pleural effusion.  IMPRESSION: Enhancing lesion in the left lobe liver slightly smaller compared to prior  CT. However, there is a new heterogeneously enhancing lesion in the posterior segment right lobe liver measuring 1.7 x 2.2 cm. The enhancement pattern is not typical for hemangioma. Although the differential includes dysplastic nodule, focal nodular hyperplasia, hepatocellular carcinoma is not excluded.  Mild prominent small bowel loops in the pelvis, can be seen ileus.   Electronically Signed   By: Sherian Rein M.D.   On: 07/10/2013 17:04    Microbiology: Recent Results (from the past 240 hour(s))  CLOSTRIDIUM DIFFICILE BY PCR     Status: None   Collection Time    07/10/13 10:11 PM      Result Value Range Status   C difficile by pcr NEGATIVE  NEGATIVE Final   Comment: Performed at [facility]     Labs: Basic Metabolic Panel:  Recent Labs Lab 07/11/13 0530 07/11/13 1244 07/12/13 0511 07/13/13 0520 07/13/13 0800 07/14/13 0512  NA 133* 129* 134* 133*  --  132*  K  3.9 4.8 3.5 3.9  --  3.6  CL 101 97 101 101  --  97  CO2 24 24 25 23   --  26  GLUCOSE 216* 487* 138* 269* 481* 192*  BUN 5* 6 6 8   --  8  CREATININE 0.54 0.50 0.49* 0.57  --  0.56  CALCIUM 8.2* 8.5 8.2* 8.1*  --  8.4  MG 1.9  --   --   --   --   --    Liver Function Tests:  Recent Labs Lab 07/10/13 1417 07/11/13 0530 07/12/13 0511 07/13/13 0520 07/14/13 0512  AST 96* 82* 62* 52* 46*  ALT 126* 100* 86* 77* 65*  ALKPHOS 140* 118* 109 102 95  BILITOT 0.2* 0.3 0.2* 0.2* 0.3  PROT 6.3 5.4* 5.1* 5.3* 5.3*  ALBUMIN 3.5 3.0* 2.9* 2.9* 3.0*    Recent Labs Lab 07/10/13 1417  LIPASE 28   No results found for this basename: AMMONIA,  in the last 168 hours CBC:  Recent Labs Lab 07/10/13 1417 07/11/13 0530 07/12/13 0511 07/14/13 0512  WBC 7.1 7.4 7.5 7.0  NEUTROABS 3.9  --   --   --   HGB 13.4 12.4* 11.8* 12.1*  HCT 37.9* 35.8* 34.6* 35.7*  MCV 88.3 89.3 89.9 90.6  PLT 194 186 181 168   Cardiac Enzymes: No results found for this basename: CKTOTAL, CKMB, CKMBINDEX, TROPONINI,  in the last 168  hours BNP: BNP (last 3 results) No results found for this basename: PROBNP,  in the last 8760 hours CBG:  Recent Labs Lab 07/13/13 1117 07/13/13 1624 07/13/13 2128 07/14/13 0716 07/14/13 1146  GLUCAP 241* 141* 190* 290* 363*       Signed:  Sharah Finnell  [facility] 07/14/2013, 1:20 PM

## 2013-07-14 NOTE — Progress Notes (Signed)
Inpatient Diabetes Program Recommendations  AACE/ADA: New Consensus Statement on Inpatient Glycemic Control (2013)  Target Ranges:  Prepandial:   less than 140 mg/dL      Peak postprandial:   less than 180 mg/dL (1-2 hours)      Critically ill patients:  140 - 180 mg/dL   Reason for Visit: Patient being discharged today. Discussed discharge plan with Dr. Janee Morn.  Patient has only been on 5 units of Lantus in the hospital due to low CBG on 07/11/13.  CBG's have been high 12/7 and 12/8.  He currently does not have insurance coverage.  Therefore Dr. Janee Morn will discharge on Novolin 70/30 (can be purchased at [facility] for 24.88$).  Patient has appointment to follow-up on Friday 07/18/13 at [facility].  Discussed insulin regimen with patient and that it is to be taken with breakfast and supper.  He has a meter and syringes at home.  Discussed importance of checking CBG's 3 times a day and taking CBG log to Dr. Butler Denmark on Friday.  Discussed hypoglycemia treatment and signs and symptoms.  Patient was able to teach back proper treatment of hypoglycemia.  Will likely need titration up of insulin.    Beryl Meager, RN, BC-ADM Inpatient Diabetes Coordinator Pager [phone]

## 2013-07-14 NOTE — Progress Notes (Signed)
Patient discharged home with brother, alert and oriented, discharged orders given, patient verbalize understanding of discharge orders given, My Chart refused at this time, will access at home, patient in stable condition at this time

## 2013-07-18 ENCOUNTER — Ambulatory Visit: Payer: Self-pay | Attending: Internal Medicine | Admitting: Internal Medicine

## 2013-07-18 ENCOUNTER — Encounter: Payer: Self-pay | Admitting: Internal Medicine

## 2013-07-18 VITALS — BP 112/72 | HR 71 | Temp 98.5°F | Resp 14 | Ht 67.0 in | Wt 122.2 lb

## 2013-07-18 DIAGNOSIS — K860 Alcohol-induced chronic pancreatitis: Secondary | ICD-10-CM

## 2013-07-18 DIAGNOSIS — K7689 Other specified diseases of liver: Secondary | ICD-10-CM | POA: Insufficient documentation

## 2013-07-18 DIAGNOSIS — K769 Liver disease, unspecified: Secondary | ICD-10-CM

## 2013-07-18 DIAGNOSIS — K861 Other chronic pancreatitis: Secondary | ICD-10-CM

## 2013-07-18 DIAGNOSIS — E119 Type 2 diabetes mellitus without complications: Secondary | ICD-10-CM | POA: Insufficient documentation

## 2013-07-18 MED ORDER — INSULIN NPH ISOPHANE & REGULAR (70-30) 100 UNIT/ML ~~LOC~~ SUSP: 10.0000 [IU] | Freq: Two times a day (BID) | SUBCUTANEOUS | Status: DC

## 2013-07-18 NOTE — Progress Notes (Signed)
Patient ID: [name], male   DOB: 10-Jan-1966, 47 y.o.   MRN: [id number] Patient Demographics  Rodney Barber, is a 47 y.o. male  [id number]  [id number]  DOB - 1966/06/06  CC:  Chief Complaint  Patient presents with  . Follow-up       HPI: Rodney Barber is a 47 y.o. male here today to establish medical care. 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 Patient is here today because he will run out of insulin, he has no money to purchase new vials of insulin, he was told will help him out with free samples. He has no complaint today. He was recently admitted and discharged for chronic pancreatitis, his evaluations by CT scan of the abdomen revealed a liver lesion among other findings, however MRI was negative for liver lesions. His hemoglobin A1c initially was 14.3 now 5.2%. He used to have chronic diarrhea but now better with Creon, he had lost weight about 40 pounds in the last few months. His stool was negative for C. Difficile, HIV was nonreactive. He continues to smoke cigarette about half a pack per day, he does not drink alcohol anymore.  Patient has No headache, No chest pain, No abdominal pain - No Nausea, No new weakness tingling or numbness, No Cough - SOB.  No Known Allergies Past Medical History  Diagnosis Date  . Pancreatitis   . Diabetes mellitus    Current Outpatient Prescriptions on File Prior to Visit  Medication Sig Dispense Refill  . feeding supplement, ENSURE COMPLETE, (ENSURE COMPLETE) LIQD Take 237 mLs by mouth 3 (three) times daily with meals.      . insulin NPH-regular (NOVOLIN 70/30) (70-30) 100 UNIT/ML injection Inject 10 Units into the skin 2 (two) times daily with a meal.  10 mL  0  . lipase/protease/amylase (CREON-12/PANCREASE) 12000 UNITS CPEP capsule Take 1 capsule by mouth 3 (three) times daily with meals.  120 capsule  0   No current  facility-administered medications on file prior to visit.   No family history on file. History   Social History  . Marital Status: Single    Spouse Name: N/A    Number of Children: N/A  . Years of Education: N/A   Occupational History  . Not on file.   Social History Main Topics  . Smoking status: Current Every Day Smoker -- 0.25 packs/day for 30 years    Types: Cigarettes  . Smokeless tobacco: Never Used     Comment: 6 cigarett /day  . Alcohol Use: No  . Drug Use: Yes    Special: "Crack" cocaine, Other-see comments     Comment: 07/10/13 - "every now and then"  . Sexual Activity: Not on file   Other Topics Concern  . Not on file   Social History Narrative  . No narrative on file    Review of Systems: Constitutional: Negative for fever, chills, diaphoresis, activity change. HENT: Negative for ear pain, nosebleeds, congestion, facial swelling, rhinorrhea, neck pain, neck stiffness and ear discharge.  Eyes: Negative for pain, discharge, redness, itching and visual disturbance. Respiratory: Negative for cough, choking, chest tightness, shortness of breath, wheezing and stridor.  Cardiovascular: Negative for chest pain, palpitations and leg swelling. Gastrointestinal: Negative for abdominal distention. Genitourinary: Negative for dysuria, urgency, frequency, hematuria, flank pain, decreased urine volume, difficulty urinating and dyspareunia.  Musculoskeletal: Negative for back pain, joint swelling, arthralgia and gait problem. Neurological: Negative for dizziness, tremors,  seizures, syncope, facial asymmetry, speech difficulty, weakness, light-headedness, numbness and headaches.  Hematological: Negative for adenopathy. Does not bruise/bleed easily. Psychiatric/Behavioral: Negative for hallucinations, behavioral problems, confusion, dysphoric mood, decreased concentration and agitation.    Objective:   Filed Vitals:   07/18/13 1741  BP: 112/72  Pulse: 71  Temp: 98.5 F  (36.9 C)  Resp: 14    Physical Exam: Constitutional: Patient appears well-developed and well-nourished. No distress. Cachectic with temporal wasting HENT: Normocephalic, atraumatic, External right and left ear normal. Oropharynx is clear and moist.  Eyes: Conjunctivae and EOM are normal. PERRLA, no scleral icterus. Neck: Normal ROM. Neck supple. No JVD. No tracheal deviation. No thyromegaly. CVS: RRR, S1/S2 +, no murmurs, no gallops, no carotid bruit.  Pulmonary: Effort and breath sounds normal, no stridor, rhonchi, wheezes, rales.  Abdominal: Soft. BS +, no distension, mild tenderness in the epigastric, no rebound or guarding.  Musculoskeletal: Normal range of motion. No edema and no tenderness.  Lymphadenopathy: No lymphadenopathy noted, cervical, inguinal or axillary Neuro: Alert. Normal reflexes, muscle tone coordination. No cranial nerve deficit. Skin: Skin is warm and dry. No rash noted. Not diaphoretic. No erythema. No pallor. Psychiatric: Normal mood and affect. Behavior, judgment, thought content normal.  Lab Results  Component Value Date   WBC 7.0 07/14/2013   HGB 12.1* 07/14/2013   HCT 35.7* 07/14/2013   MCV 90.6 07/14/2013   PLT 168 07/14/2013   Lab Results  Component Value Date   CREATININE 0.56 07/14/2013   BUN 8 07/14/2013   NA 132* 07/14/2013   K 3.6 07/14/2013   CL 97 07/14/2013   CO2 26 07/14/2013    Lab Results  Component Value Date   HGBA1C 5.2 07/10/2013   Lipid Panel     Component Value Date/Time   CHOL 130 12/09/2010 0540   TRIG 69 12/09/2010 0540   HDL 41 12/09/2010 0540   CHOLHDL 3.2 12/09/2010 0540   VLDL 14 12/09/2010 0540   LDLCALC  Value: 75        Total Cholesterol/HDL:CHD Risk Coronary Heart Disease Risk Table                     Men   Women  1/2 Average Risk   3.4   3.3  Average Risk       5.0   4.4  2 X Average Risk   9.6   7.1  3 X Average Risk  23.4   11.0        Use the calculated Patient Ratio above and the CHD Risk Table to determine the patient's CHD  Risk.        ATP III CLASSIFICATION (LDL):  <100     mg/dL   Optimal  161-096  mg/dL   Near or Above                    Optimal  130-159  mg/dL   Borderline  045-409  mg/dL   High  >811     mg/dL   Very High 04/07/4781 9562       Assessment and plan:    #1 enhancing right lobe liver lesion  Differential includes dysplastic nodule versus focal hyperplasia versus malignancy. Alpha-fetoprotein was negative. MRI of the abdomen with left hepatic lobe lesion not identified. The right hepatic lobe lesion is most likely a perfusion abnormality. #2 history of common bile duct stricture/prior stent  CT of the abdomen and pelvis with no ductal dilatation noted. MRI  of the abdomen with changes of chronic calcific pancreatitis with market pancreatic ductal dilatation. Patient denies any stomach pain. Patient has been started on Creon. LFTs trending down. Will need to followup with GI as outpatient.  #3 Hypokalemia/hyponatremia  Secondary to GI losses. Potassium has been repleted. Magnesium level is 1.9. Sodium level improved. We'll repeat his comprehensive metabolic panel  next visit #4 Diabetes mellitus: Insulin-dependent  Untreated for greater than a year. Last hemoglobin A1c was 14.3. Hemoglobin A1c is now 5.2%. CBGs have ranged from 314-401. Patient was maintained on sliding scale insulin. Patient was initially placed on Lantus 10 units however had a hypoglycemic spell and this was decreased to 5 units daily.  Patient has been given a free sample of Humulin 70/30, 10 units every 12 hourly #5 Chronic diarrhea/multiple loose stools with weight loss  Likely secondary to malabsorption from history of chronic pancreatitis. C. difficile PCR is negative. Stool with moderate free fatty acids and moderate neutral fat. HIV is nonreactive. Creon has been started with improvement in stools.  #6 malnutrition  Patient is cachectic with temporal wasting. Patient states has had multiple stools per day usually after meals  which may be the likely etiology. Patient states has had 35-40 pound weight loss in the last 3-6 months. CT of the abdomen and pelvis with abnormality noted in the liver. MRI of the abdomen was negative for any lesions. C. difficile PCR is negative. Creon has been started. Patient has been seen by the dietitian and has been started on some supplements.  #7 Chronic pancreatitis  Stable. No abdominal pain. Patient however seems to be having multiple loose stools after eating over the past 3-6 months. C. difficile PCR was negative. Continue Creon. Stool is improved. Will need to followup with GI as outpatient.   Follow up in 4 weeks or when necessary   The patient was given clear instructions to go to ER or return to medical center if symptoms don't improve, worsen or new problems develop. The patient verbalized understanding. The patient was told to call to get lab results if they haven't heard anything in the next week.     Jeanann Lewandowsky, MD, MHA, [facility] [place], [place] [phone]   07/18/2013, 6:26 PM

## 2013-07-18 NOTE — Progress Notes (Signed)
Pt is here for hospital f/u from pancreatitis. No pain today. Recently taking NovoLog and Lantus; ran out of insulin.

## 2013-07-18 NOTE — Patient Instructions (Signed)
Acute Pancreatitis °Acute pancreatitis is a disease in which the pancreas becomes suddenly inflamed. The pancreas is a large gland located behind your stomach. The pancreas produces enzymes that help digest food. The pancreas also releases the hormones glucagon and insulin that help regulate blood sugar. Damage to the pancreas occurs when the digestive enzymes from the pancreas are activated and begin attacking the pancreas before being released into the intestine. Most acute attacks last a couple of days and can cause serious complications. Some people become dehydrated and develop low blood pressure. In severe cases, bleeding into the pancreas can lead to shock and can be life-threatening. The lungs, heart, and kidneys may fail. °CAUSES  °Pancreatitis can happen to anyone. In some cases, the cause is unknown. Most cases are caused by: °· Alcohol abuse. °· Gallstones. °Other less common causes are: °· Certain medicines. °· Exposure to certain chemicals. °· Infection. °· Damage caused by an accident (trauma). °· Abdominal surgery. °SYMPTOMS  °· Pain in the upper abdomen that may radiate to the back. °· Tenderness and swelling of the abdomen. °· Nausea and vomiting. °DIAGNOSIS  °Your caregiver will perform a physical exam. Blood and stool tests may be done to confirm the diagnosis. Imaging tests may also be done, such as X-rays, CT scans, or an ultrasound of the abdomen. °TREATMENT  °Treatment usually requires a stay in the hospital. Treatment may include: °· Pain medicine. °· Fluid replacement through an intravenous line (IV). °· Placing a tube in the stomach to remove stomach contents and control vomiting. °· Not eating for 3 or 4 days. This gives your pancreas a rest, because enzymes are not being produced that can cause further damage. °· Antibiotic medicines if your condition is caused by an infection. °· Surgery of the pancreas or gallbladder. °HOME CARE INSTRUCTIONS  °· Follow the diet advised by your  caregiver. This may involve avoiding alcohol and decreasing the amount of fat in your diet. °· Eat smaller, more frequent meals. This reduces the amount of digestive juices the pancreas produces. °· Drink enough fluids to keep your urine clear or pale yellow. °· Only take over-the-counter or prescription medicines as directed by your caregiver. °· Avoid drinking alcohol if it caused your condition. °· Do not smoke. °· Get plenty of rest. °· Check your blood sugar at home as directed by your caregiver. °· Keep all follow-up appointments as directed by your caregiver. °SEEK MEDICAL CARE IF:  °· You do not recover as quickly as expected. °· You develop new or worsening symptoms. °· You have persistent pain, weakness, or nausea. °· You recover and then have another episode of pain. °SEEK IMMEDIATE MEDICAL CARE IF:  °· You are unable to eat or keep fluids down. °· Your pain becomes severe. °· You have a fever or persistent symptoms for more than 2 to 3 days. °· You have a fever and your symptoms suddenly get worse. °· Your skin or the white part of your eyes turn yellow (jaundice). °· You develop vomiting. °· You feel dizzy, or you faint. °· Your blood sugar is high (over 300 mg/dL). °MAKE SURE YOU:  °· Understand these instructions. °· Will watch your condition. °· Will get help right away if you are not doing well or get worse. °Document Released: 07/24/2005 Document Revised: 01/23/2012 Document Reviewed: 11/02/2011 °[employer]® Patient Information ©2014 [employer]. ° °

## 2013-07-22 DIAGNOSIS — K769 Liver disease, unspecified: Secondary | ICD-10-CM | POA: Insufficient documentation

## 2013-07-22 DIAGNOSIS — K860 Alcohol-induced chronic pancreatitis: Secondary | ICD-10-CM | POA: Insufficient documentation

## 2013-07-22 HISTORY — DX: Alcohol-induced chronic pancreatitis: K86.0

## 2013-08-18 ENCOUNTER — Ambulatory Visit: Payer: Self-pay

## 2013-09-08 ENCOUNTER — Encounter: Payer: Self-pay | Admitting: Internal Medicine

## 2013-09-08 ENCOUNTER — Ambulatory Visit: Payer: Self-pay | Attending: Internal Medicine | Admitting: Internal Medicine

## 2013-09-08 VITALS — BP 109/74 | HR 88 | Temp 98.8°F | Resp 16 | Ht 67.0 in | Wt 124.0 lb

## 2013-09-08 DIAGNOSIS — K860 Alcohol-induced chronic pancreatitis: Secondary | ICD-10-CM

## 2013-09-08 DIAGNOSIS — K861 Other chronic pancreatitis: Secondary | ICD-10-CM

## 2013-09-08 DIAGNOSIS — E1110 Type 2 diabetes mellitus with ketoacidosis without coma: Secondary | ICD-10-CM

## 2013-09-08 DIAGNOSIS — E1169 Type 2 diabetes mellitus with other specified complication: Secondary | ICD-10-CM | POA: Insufficient documentation

## 2013-09-08 DIAGNOSIS — E785 Hyperlipidemia, unspecified: Secondary | ICD-10-CM | POA: Insufficient documentation

## 2013-09-08 DIAGNOSIS — E119 Type 2 diabetes mellitus without complications: Secondary | ICD-10-CM

## 2013-09-08 DIAGNOSIS — E1310 Other specified diabetes mellitus with ketoacidosis without coma: Secondary | ICD-10-CM

## 2013-09-08 LAB — GLUCOSE, POCT (MANUAL RESULT ENTRY)
POC GLUCOSE: 366 mg/dL — AB (ref 70–99)
POC Glucose: 302 mg/dl — AB (ref 70–99)

## 2013-09-08 MED ORDER — INSULIN ASPART PROT & ASPART (70-30 MIX) 100 UNIT/ML ~~LOC~~ SUSP: 20.0000 [IU] | Freq: Once | SUBCUTANEOUS | Status: DC

## 2013-09-08 MED ORDER — INSULIN ASPART 100 UNIT/ML ~~LOC~~ SOLN
20.0000 [IU] | Freq: Once | SUBCUTANEOUS | Status: AC
  Administered 2013-09-08: 20 [IU] via SUBCUTANEOUS

## 2013-09-08 MED ORDER — INSULIN ASPART 100 UNIT/ML ~~LOC~~ SOLN: 20.0000 [IU] | Freq: Once | SUBCUTANEOUS | Status: DC

## 2013-09-08 MED ORDER — INSULIN NPH (HUMAN) (ISOPHANE) 100 UNIT/ML ~~LOC~~ SUSP: 30.0000 [IU] | Freq: Two times a day (BID) | SUBCUTANEOUS | Status: DC

## 2013-09-08 NOTE — Progress Notes (Signed)
Patient ID: Rodney Barber, male   DOB: June 21, 1966, 48 y.o.   MRN: [id number] Patient Demographics  Rodney Barber, is a 48 y.o. male  NFA:[id number]  ION:[id number]  DOB - 1966/03/26  Chief Complaint  Patient presents with  . Follow-up  . Diabetes        Subjective:   Rodney Barber is a 48 y.o. male here today for a follow up visit. Patient is known to have chronic pancreatitis with diabetes mellitus on insulin. Last hemoglobin A1c 2 months ago was 5.2%. Patient claims to be compliant with insulin but his blood sugar has ranged between 150 and 350. He endorsed noncompliant with nutrition, still eating whatever he feels like including high carbohydrate diet. He continues to smoke cigarettes heavily, about half a pack to one pack per day. Blood sugar today is very high, she has been given insulin subcutaneously, awaiting repeat blood sugar check. He claims to have quit alcohol completely. Patient has No headache, No chest pain, No abdominal pain - No Nausea, No new weakness tingling or numbness, No Cough - SOB.  ALLERGIES: No Known Allergies  PAST MEDICAL HISTORY: Past Medical History  Diagnosis Date  . Pancreatitis   . Diabetes mellitus     MEDICATIONS AT HOME: Prior to Admission medications   Medication Sig Start Date End Date Taking? Authorizing Provider  feeding supplement, ENSURE COMPLETE, (ENSURE COMPLETE) LIQD Take 237 mLs by mouth 3 (three) times daily with meals. 07/14/13  Yes Rodney Bong, MD  insulin NPH-regular (NOVOLIN 70/30) (70-30) 100 UNIT/ML injection Inject 10 Units into the skin 2 (two) times daily with a meal. 07/18/13  Yes Rodney Lewandowsky, MD  lipase/protease/amylase (CREON-12/PANCREASE) 12000 UNITS CPEP capsule Take 1 capsule by mouth 3 (three) times daily with meals. 07/14/13  Yes Rodney Bong, MD  insulin NPH Human (HUMULIN N) 100 UNIT/ML injection Inject 30 Units into the skin 2 (two) times daily before a meal. 09/08/13   Rodney Lewandowsky, MD      Objective:   Filed Vitals:   09/08/13 1035  BP: 109/74  Pulse: 88  Temp: 98.8 F (37.1 C)  TempSrc: Oral  Resp: 16  Height: 5\' 7"  (1.702 m)  Weight: 124 lb (56.246 kg)  SpO2: 98%    Exam General appearance : Awake, alert, not in any distress. Speech Clear. Not toxic looking, cachectic HEENT: Atraumatic and Normocephalic, pupils equally reactive to light and accomodation Neck: supple, no JVD. No cervical lymphadenopathy.  Chest:Good air entry bilaterally, no added sounds  CVS: S1 S2 regular, no murmurs.  Abdomen: Bowel sounds present, Non tender and not distended with no gaurding, rigidity or rebound. Extremities: B/L Lower Ext shows no edema, both legs are warm to touch Neurology: Awake alert, and oriented X 3, CN II-XII intact, Non focal Skin:No Rash Wounds:N/A   Data Review   CBC No results found for this basename: WBC, HGB, HCT, PLT, MCV, MCH, MCHC, RDW, NEUTRABS, LYMPHSABS, MONOABS, EOSABS, BASOSABS, BANDABS, BANDSABD,  in the last 168 hours  Chemistries   No results found for this basename: NA, K, CL, CO2, GLUCOSE, BUN, CREATININE, GFRCGP, CALCIUM, MG, AST, ALT, ALKPHOS, BILITOT,  in the last 168 hours ------------------------------------------------------------------------------------------------------------------ No results found for this basename: HGBA1C,  in the last 72 hours ------------------------------------------------------------------------------------------------------------------ No results found for this basename: CHOL, HDL, LDLCALC, TRIG, CHOLHDL, LDLDIRECT,  in the last 72 hours ------------------------------------------------------------------------------------------------------------------ No results found for this basename: TSH, T4TOTAL, FREET3, T3FREE, THYROIDAB,  in the last 72 hours ------------------------------------------------------------------------------------------------------------------ No results found for this  basename:  VITAMINB12, FOLATE, FERRITIN, TIBC, IRON, RETICCTPCT,  in the last 72 hours  Coagulation profile  No results found for this basename: INR, PROTIME,  in the last 168 hours    Assessment & Plan   1. DM (diabetes mellitus) type 2, uncontrolled, with ketoacidosis - Glucose (CBG) - insulin aspart (novoLOG) injection 20 Units; Inject 20 Units into the skin once. - insulin NPH Human (HUMULIN N) 100 UNIT/ML injection; Inject 30 Units into the skin 2 (two) times daily before a meal.  Dispense: 30 mL; Refill: 3  2. Alcohol-induced chronic pancreatitis Patient was extensively counseled as to the liver from alcohol completely Patient was counseled on nutrition and exercise  Patient was encouraged to keep a blood sugar log and bring the log to the clinic next visit  Follow up in 4 weeks or when necessary  The patient was given clear instructions to go to ER or return to medical center if symptoms don't improve, worsen or new problems develop. The patient verbalized understanding. The patient was told to call to get lab results if they haven't heard anything in the next week.    Rodney Lewandowsky, MD, MHA, FACP, FAAP [facility] [place], [place] [phone]   09/08/2013, 11:38 AM

## 2013-09-08 NOTE — Progress Notes (Signed)
Post blood sugar 301 20 units given

## 2013-09-08 NOTE — Patient Instructions (Signed)
Diabetes and Exercise Exercising regularly is important. It is not just about losing weight. It has many health benefits, such as:  Improving your overall fitness, flexibility, and endurance.  Increasing your bone density.  Helping with weight control.  Decreasing your body fat.  Increasing your muscle strength.  Reducing stress and tension.  Improving your overall health. People with diabetes who exercise gain additional benefits because exercise:  Reduces appetite.  Improves the body's use of blood sugar (glucose).  Helps lower or control blood glucose.  Decreases blood pressure.  Helps control blood lipids (such as cholesterol and triglycerides).  Improves the body's use of the hormone insulin by:  Increasing the body's insulin sensitivity.  Reducing the body's insulin needs.  Decreases the risk for heart disease because exercising:  Lowers cholesterol and triglycerides levels.  Increases the levels of good cholesterol (such as high-density lipoproteins [HDL]) in the body.  Lowers blood glucose levels. YOUR ACTIVITY PLAN  Choose an activity that you enjoy and set realistic goals. Your health care provider or diabetes educator can help you make an activity plan that works for you. You can break activities into 2 or 3 sessions throughout the day. Doing so is as good as one long session. Exercise ideas include:  Taking the dog for a walk.  Taking the stairs instead of the elevator.  Dancing to your favorite song.  Doing your favorite exercise with a friend. RECOMMENDATIONS FOR EXERCISING WITH TYPE 1 OR TYPE 2 DIABETES   Check your blood glucose before exercising. If blood glucose levels are greater than 240 mg/dL, check for urine ketones. Do not exercise if ketones are present.  Avoid injecting insulin into areas of the body that are going to be exercised. For example, avoid injecting insulin into:  The arms when playing tennis.  The legs when  jogging.  Keep a record of:  Food intake before and after you exercise.  Expected peak times of insulin action.  Blood glucose levels before and after you exercise.  The type and amount of exercise you have done.  Review your records with your health care provider. Your health care provider will help you to develop guidelines for adjusting food intake and insulin amounts before and after exercising.  If you take insulin or oral hypoglycemic agents, watch for signs and symptoms of hypoglycemia. They include:  Dizziness.  Shaking.  Sweating.  Chills.  Confusion.  Drink plenty of water while you exercise to prevent dehydration or heat stroke. Body water is lost during exercise and must be replaced.  Talk to your health care provider before starting an exercise program to make sure it is safe for you. Remember, almost any type of activity is better than none. Document Released: 10/14/2003 Document Revised: 03/26/2013 Document Reviewed: 12/31/2012 [employer] Patient Information 2014 [employer]. Diabetes Meal Planning Guide The diabetes meal planning guide is a tool to help you plan your meals and snacks. It is important for people with diabetes to manage their blood glucose (sugar) levels. Choosing the right foods and the right amounts throughout your day will help control your blood glucose. Eating right can even help you improve your blood pressure and reach or maintain a healthy weight. CARBOHYDRATE COUNTING MADE EASY When you eat carbohydrates, they turn to sugar. This raises your blood glucose level. Counting carbohydrates can help you control this level so you feel better. When you plan your meals by counting carbohydrates, you can have more flexibility in what you eat and balance your medicine   with your food intake. Carbohydrate counting simply means adding up the total amount of carbohydrate grams in your meals and snacks. Try to eat about the same amount at each meal. Foods  with carbohydrates are listed below. Each portion below is 1 carbohydrate serving or 15 grams of carbohydrates. Ask your dietician how many grams of carbohydrates you should eat at each meal or snack. Grains and Starches  1 slice bread.   English muffin or hotdog/hamburger bun.   cup cold cereal (unsweetened).   cup cooked pasta or rice.   cup starchy vegetables (corn, potatoes, peas, beans, winter squash).  1 tortilla (6 inches).   bagel.  1 waffle or pancake (size of a CD).   cup cooked cereal.  4 to 6 small crackers. *Whole grain is recommended. Fruit  1 cup fresh unsweetened berries, melon, papaya, pineapple.  1 small fresh fruit.   banana or mango.   cup fruit juice (4 oz unsweetened).   cup canned fruit in natural juice or water.  2 tbs dried fruit.  12 to 15 grapes or cherries. Milk and Yogurt  1 cup fat-free or 1% milk.  1 cup soy milk.  6 oz light yogurt with sugar-free sweetener.  6 oz low-fat soy yogurt.  6 oz plain yogurt. Vegetables  1 cup raw or  cup cooked is counted as 0 carbohydrates or a "free" food.  If you eat 3 or more servings at 1 meal, count them as 1 carbohydrate serving. Other Carbohydrates   oz chips or pretzels.   cup ice cream or frozen yogurt.   cup sherbet or sorbet.  2 inch square cake, no frosting.  1 tbs honey, sugar, jam, jelly, or syrup.  2 small cookies.  3 squares of graham crackers.  3 cups popcorn.  6 crackers.  1 cup broth-based soup.  Count 1 cup casserole or other mixed foods as 2 carbohydrate servings.  Foods with less than 20 calories in a serving may be counted as 0 carbohydrates or a "free" food. You may want to purchase a book or computer software that lists the carbohydrate gram counts of different foods. In addition, the nutrition facts panel on the labels of the foods you eat are a good source of this information. The label will tell you how big the serving size is and the  total number of carbohydrate grams you will be eating per serving. Divide this number by 15 to obtain the number of carbohydrate servings in a portion. Remember, 1 carbohydrate serving equals 15 grams of carbohydrate. SERVING SIZES Measuring foods and serving sizes helps you make sure you are getting the right amount of food. The list below tells how big or small some common serving sizes are.  1 oz.........4 stacked dice.  3 oz.........Deck of cards.  1 tsp........Tip of little finger.  1 tbs........Thumb.  2 tbs........Golf ball.   cup.......Half of a fist.  1 cup........A fist. SAMPLE DIABETES MEAL PLAN Below is a sample meal plan that includes foods from the grain and starches, dairy, vegetable, fruit, and meat groups. A dietician can individualize a meal plan to fit your calorie needs and tell you the number of servings needed from each food group. However, controlling the total amount of carbohydrates in your meal or snack is more important than making sure you include all of the food groups at every meal. You may interchange carbohydrate containing foods (dairy, starches, and fruits). The meal plan below is an example of a 2000 calorie diet   using carbohydrate counting. This meal plan has 17 carbohydrate servings. Breakfast  1 cup oatmeal (2 carb servings).   cup light yogurt (1 carb serving).  1 cup blueberries (1 carb serving).   cup almonds. Snack  1 large apple (2 carb servings).  1 low-fat string cheese stick. Lunch  Chicken breast salad.  1 cup spinach.   cup chopped tomatoes.  2 oz chicken breast, sliced.  2 tbs low-fat Italian dressing.  12 whole-wheat crackers (2 carb servings).  12 to 15 grapes (1 carb serving).  1 cup low-fat milk (1 carb serving). Snack  1 cup carrots.   cup hummus (1 carb serving). Dinner  3 oz broiled salmon.  1 cup brown rice (3 carb servings). Snack  1  cups steamed broccoli (1 carb serving) drizzled with 1  tsp olive oil and lemon juice.  1 cup light pudding (2 carb servings). DIABETES MEAL PLANNING WORKSHEET Your dietician can use this worksheet to help you decide how many servings of foods and what types of foods are right for you.  BREAKFAST Food Group and Servings / Carb Servings Grain/Starches __________________________________ Dairy __________________________________________ Vegetable ______________________________________ Fruit ___________________________________________ Meat __________________________________________ Fat ____________________________________________ LUNCH Food Group and Servings / Carb Servings Grain/Starches ___________________________________ Dairy ___________________________________________ Fruit ____________________________________________ Meat ___________________________________________ Fat _____________________________________________ DINNER Food Group and Servings / Carb Servings Grain/Starches ___________________________________ Dairy ___________________________________________ Fruit ____________________________________________ Meat ___________________________________________ Fat _____________________________________________ SNACKS Food Group and Servings / Carb Servings Grain/Starches ___________________________________ Dairy ___________________________________________ Vegetable _______________________________________ Fruit ____________________________________________ Meat ___________________________________________ Fat _____________________________________________ DAILY TOTALS Starches _________________________ Vegetable ________________________ Fruit ____________________________ Dairy ____________________________ Meat ____________________________ Fat ______________________________ Document Released: 04/20/2005 Document Revised: 10/16/2011 Document Reviewed: 03/01/2009 [employer] Patient Information 2014 [employer].  

## 2013-09-08 NOTE — Progress Notes (Signed)
Pt here f/u uncontrolled Diabetes with insulin  Pt is taking Novolg 70/30 1o units BID and Humulin 10 units at bedtime CBG 366 Pt denies sx at this time

## 2013-10-30 ENCOUNTER — Ambulatory Visit: Payer: Self-pay | Admitting: Internal Medicine

## 2013-11-04 ENCOUNTER — Encounter: Payer: Self-pay | Admitting: Pharmacist

## 2013-11-04 ENCOUNTER — Ambulatory Visit: Payer: Self-pay | Attending: Internal Medicine | Admitting: Pharmacist

## 2013-11-04 VITALS — BP 132/79 | HR 95

## 2013-11-04 DIAGNOSIS — E119 Type 2 diabetes mellitus without complications: Secondary | ICD-10-CM | POA: Insufficient documentation

## 2013-11-04 DIAGNOSIS — Z0000 Encounter for general adult medical examination without abnormal findings: Secondary | ICD-10-CM

## 2013-11-04 LAB — CBC WITH DIFFERENTIAL/PLATELET
Basophils Absolute: 0.1 10*3/uL (ref 0.0–0.1)
Basophils Relative: 1 % (ref 0–1)
EOS ABS: 0.3 10*3/uL (ref 0.0–0.7)
Eosinophils Relative: 4 % (ref 0–5)
HCT: 41.4 % (ref 39.0–52.0)
HEMOGLOBIN: 13.9 g/dL (ref 13.0–17.0)
LYMPHS ABS: 2 10*3/uL (ref 0.7–4.0)
Lymphocytes Relative: 27 % (ref 12–46)
MCH: 29.4 pg (ref 26.0–34.0)
MCHC: 33.6 g/dL (ref 30.0–36.0)
MCV: 87.5 fL (ref 78.0–100.0)
MONOS PCT: 5 % (ref 3–12)
Monocytes Absolute: 0.4 10*3/uL (ref 0.1–1.0)
NEUTROS ABS: 4.7 10*3/uL (ref 1.7–7.7)
Neutrophils Relative %: 63 % (ref 43–77)
Platelets: 234 10*3/uL (ref 150–400)
RBC: 4.73 MIL/uL (ref 4.22–5.81)
RDW: 13.1 % (ref 11.5–15.5)
WBC: 7.4 10*3/uL (ref 4.0–10.5)

## 2013-11-04 LAB — LIPID PANEL
CHOLESTEROL: 139 mg/dL (ref 0–200)
HDL: 40 mg/dL (ref >39)
LDL CALC: 87 mg/dL (ref 0–99)
TRIGLYCERIDES: 59 mg/dL (ref <150)
Total CHOL/HDL Ratio: 3.5 Ratio
VLDL: 12 mg/dL (ref 0–40)

## 2013-11-04 LAB — POCT GLYCOSYLATED HEMOGLOBIN (HGB A1C): Hemoglobin A1C: 11.6

## 2013-11-04 LAB — COMPREHENSIVE METABOLIC PANEL
ALK PHOS: 81 U/L (ref 39–117)
ALT: 21 U/L (ref 0–53)
AST: 19 U/L (ref 0–37)
Albumin: 4.1 g/dL (ref 3.5–5.2)
BUN: 5 mg/dL — AB (ref 6–23)
CALCIUM: 9 mg/dL (ref 8.4–10.5)
CO2: 30 mEq/L (ref 19–32)
CREATININE: 0.89 mg/dL (ref 0.50–1.35)
Chloride: 104 mEq/L (ref 96–112)
Glucose, Bld: 346 mg/dL — ABNORMAL HIGH (ref 70–99)
Potassium: 4 mEq/L (ref 3.5–5.3)
Sodium: 139 mEq/L (ref 135–145)
Total Bilirubin: 0.3 mg/dL (ref 0.2–1.2)
Total Protein: 6 g/dL (ref 6.0–8.3)

## 2013-11-04 LAB — TSH: TSH: 1.036 u[IU]/mL (ref 0.350–4.500)

## 2013-11-04 LAB — GLUCOSE, POCT (MANUAL RESULT ENTRY): POC Glucose: 384 mg/dl — AB (ref 70–99)

## 2013-11-04 MED ORDER — INSULIN ASPART 100 UNIT/ML ~~LOC~~ SOLN: 20.0000 [IU] | Freq: Once | SUBCUTANEOUS | Status: DC

## 2013-11-04 MED ORDER — TRUERESULT BLOOD GLUCOSE W/DEVICE KIT: PACK | Status: DC

## 2013-11-04 MED ORDER — GLUCOSE BLOOD VI STRP: ORAL_STRIP | Status: DC

## 2013-11-04 MED ORDER — TRUEPLUS LANCETS 28G MISC: Status: DC

## 2013-11-04 NOTE — Progress Notes (Signed)
S:    Patient arrives for diabetes followup.  Patient is currently taking Novolin 70/30 25 units in the morning and evening.   O:  . Lab Results  Component Value Date   HGBA1C 11.6 11/04/2013    Pt has not been taking blood sugars readings at home because his meter did not work. His reading in clinic is 384 today.  We gave 20 units of insulin and repeated the reading after 20 mins and it was 300.  A/P: Diabetes: Pt did not understand what is A1c meant and this was explained to him. Pt was prescribed a meter with test strips and lancets today and given a log book marked with times to check his blood sugars. Increased Novolin 70/30 to 30 units in the morning. Will followup with pt in 2 weeks to determine where dose change is needed. Labs: CBC, CMP, TSH, Lipids

## 2013-11-12 ENCOUNTER — Telehealth: Payer: Self-pay

## 2013-11-12 NOTE — Telephone Encounter (Signed)
Spoke with Gery Pray  Will have Lynkin return our phone call

## 2013-11-12 NOTE — Telephone Encounter (Signed)
Message copied by Lestine Mount on Wed Nov 12, 2013  4:55 PM ------      Message from: Jeanann Lewandowsky E      Created: Wed Nov 12, 2013  4:20 PM       Please inform patient that his laboratory tests results are mostly normal except for blood sugar. Encouraged patient to continue with blood sugar control, low carbohydrate diet and regular physical exercise ------

## 2013-11-18 ENCOUNTER — Ambulatory Visit: Payer: Self-pay | Attending: Internal Medicine | Admitting: Pharmacist

## 2013-11-18 VITALS — BP 142/81 | HR 92 | Wt 122.6 lb

## 2013-11-18 DIAGNOSIS — Z09 Encounter for follow-up examination after completed treatment for conditions other than malignant neoplasm: Secondary | ICD-10-CM | POA: Insufficient documentation

## 2013-11-18 DIAGNOSIS — E119 Type 2 diabetes mellitus without complications: Secondary | ICD-10-CM | POA: Insufficient documentation

## 2013-11-18 LAB — GLUCOSE, POCT (MANUAL RESULT ENTRY): POC Glucose: 104 mg/dl — AB (ref 70–99)

## 2013-11-18 MED ORDER — INSULIN NPH ISOPHANE & REGULAR (70-30) 100 UNIT/ML ~~LOC~~ SUSP: 20.0000 [IU] | Freq: Two times a day (BID) | SUBCUTANEOUS | Status: DC

## 2013-11-18 NOTE — Progress Notes (Signed)
S:    Patient arrives for 2 week follow up for diabetes.  Pt states he has had some hypoglycemic events. Patient has taken Humulin 70/30 30 units in the morning and 25 units at bedtime.   O:  . Lab Results  Component Value Date   HGBA1C 11.6 11/04/2013     Pt has lost his log book but stated he was still taking blood sugars at home.  A/P:  Pt states that he had been eating Little Debbie cakes, ice cream, and chips as snacks.  He also likes to drink sodas.  He has been eating differently and has noticed a change in his blood sugars.   He experienced several episodes of hypoglycemia during the night.  We discussed carbs, incorporating more water, and portion sizes.  We also discussed healthier snacks to incorporate into his diet like peanut butter  and apples and cheese and wheat thins.  The pt has had previous diabetes education so he is aware of food choices.  We also discussed what should be done in the event of hypoglycemic event.  1. Test 3 times daily - morning, before or after lunch, and 2 hours after dinner 2.  Humulin 70/30 20 units BID with meal 3.  Incorporate new snack plan 4.  Follow up with pt in 4 weeks      Evaluation and management procedures were performed by the Advanced Practitioner under my supervision and collaboration. I have reviewed the Advanced Practitioner's note and chart, and I agree with the management and plan.   Jeanann Lewandowsky, MD, MHA, FACP, FAAP [facility] [place], [place] [phone]   11/18/2013, 8:02 PM

## 2013-12-16 ENCOUNTER — Ambulatory Visit: Payer: Self-pay | Admitting: Pharmacist

## 2013-12-23 ENCOUNTER — Encounter: Payer: Self-pay | Admitting: Pharmacist

## 2013-12-23 ENCOUNTER — Ambulatory Visit: Payer: Self-pay | Attending: Internal Medicine | Admitting: Pharmacist

## 2013-12-23 VITALS — BP 128/75 | HR 81

## 2013-12-23 DIAGNOSIS — E119 Type 2 diabetes mellitus without complications: Secondary | ICD-10-CM

## 2013-12-23 LAB — GLUCOSE, POCT (MANUAL RESULT ENTRY): POC Glucose: 240 mg/dl — AB (ref 70–99)

## 2013-12-23 MED ORDER — INSULIN NPH ISOPHANE & REGULAR (70-30) 100 UNIT/ML ~~LOC~~ SUSP: SUBCUTANEOUS | Status: DC

## 2013-12-23 NOTE — Progress Notes (Unsigned)
S:    Patient arrives for 1 month follow for diabetes management and has log book today.  Patient is taking Humulin 70/30 20 units BID.   O:  . Lab Results  Component Value Date   HGBA1C 11.6 11/04/2013     home fasting CBG readings of 70-135  2 hour post-prandial/random CBG readings of 170-200s  A/P: Pt states he has only been taking 15 units in the morning instead of 20 units.  Morning blood sugars are much better. In his log book, he has written that he does not take his insulin if his blood sugars are 70-80 in the morning. Explained to pt this may be the reason his blood sugars are higher in the evening. Pt states he has cut back on eating Little Debbie snacks.   Pt was counseled on the way Humulin 70/30 works and that he still needs to take the insulin with each meal every morning. Had pt talk with pharmacy about the PASS program so that he could obtain his insulin at no cost. Will have pt continue 15 units in the morning and 20 units in the evening of Humulin 70/30. Will recheck A1c at next visit.     Total time in face to face counseling 30 minutes.    Quang Thorpe B. Treyson Axel, PharmD, BCACP, CPP

## 2013-12-31 ENCOUNTER — Ambulatory Visit: Payer: Self-pay | Attending: Internal Medicine

## 2014-01-14 ENCOUNTER — Other Ambulatory Visit: Payer: Self-pay | Admitting: Internal Medicine

## 2014-01-14 MED ORDER — INSULIN NPH ISOPHANE & REGULAR (70-30) 100 UNIT/ML ~~LOC~~ SUSP: SUBCUTANEOUS | Status: DC

## 2014-07-13 ENCOUNTER — Ambulatory Visit: Payer: Self-pay | Attending: Internal Medicine | Admitting: Internal Medicine

## 2014-07-13 ENCOUNTER — Encounter: Payer: Self-pay | Admitting: Internal Medicine

## 2014-07-13 VITALS — BP 145/75 | HR 82 | Temp 98.3°F | Resp 18 | Ht 68.0 in | Wt 125.0 lb

## 2014-07-13 DIAGNOSIS — K860 Alcohol-induced chronic pancreatitis: Secondary | ICD-10-CM | POA: Insufficient documentation

## 2014-07-13 DIAGNOSIS — E119 Type 2 diabetes mellitus without complications: Secondary | ICD-10-CM | POA: Insufficient documentation

## 2014-07-13 DIAGNOSIS — F17210 Nicotine dependence, cigarettes, uncomplicated: Secondary | ICD-10-CM | POA: Insufficient documentation

## 2014-07-13 DIAGNOSIS — Z794 Long term (current) use of insulin: Secondary | ICD-10-CM | POA: Insufficient documentation

## 2014-07-13 LAB — GLUCOSE, POCT (MANUAL RESULT ENTRY): POC GLUCOSE: 72 mg/dL (ref 70–99)

## 2014-07-13 LAB — POCT GLYCOSYLATED HEMOGLOBIN (HGB A1C): Hemoglobin A1C: 9.2

## 2014-07-13 MED ORDER — PANCRELIPASE (LIP-PROT-AMYL) 12000-38000 UNITS PO CPEP: 12000.0000 [IU] | ORAL_CAPSULE | Freq: Three times a day (TID) | ORAL | Status: DC

## 2014-07-13 NOTE — Patient Instructions (Signed)
Basic Carbohydrate Counting for Diabetes Mellitus Carbohydrate counting is a method for keeping track of the amount of carbohydrates you eat. Eating carbohydrates naturally increases the level of sugar (glucose) in your blood, so it is important for you to know the amount that is okay for you to have in every meal. Carbohydrate counting helps keep the level of glucose in your blood within normal limits. The amount of carbohydrates allowed is different for every person. A dietitian can help you calculate the amount that is right for you. Once you know the amount of carbohydrates you can have, you can count the carbohydrates in the foods you want to eat. Carbohydrates are found in the following foods:  Grains, such as breads and cereals.  Dried beans and soy products.  Starchy vegetables, such as potatoes, peas, and corn.  Fruit and fruit juices.  Milk and yogurt.  Sweets and snack foods, such as cake, cookies, candy, chips, soft drinks, and fruit drinks. CARBOHYDRATE COUNTING There are two ways to count the carbohydrates in your food. You can use either of the methods or a combination of both. Reading the "Nutrition Facts" on Packaged Food The "Nutrition Facts" is an area that is included on the labels of almost all packaged food and beverages in the [place]. It includes the serving size of that food or beverage and information about the nutrients in each serving of the food, including the grams (g) of carbohydrate per serving.  Decide the number of servings of this food or beverage that you will be able to eat or drink. Multiply that number of servings by the number of grams of carbohydrate that is listed on the label for that serving. The total will be the amount of carbohydrates you will be having when you eat or drink this food or beverage. Learning Standard Serving Sizes of Food When you eat food that is not packaged or does not include "Nutrition Facts" on the label, you need to  measure the servings in order to count the amount of carbohydrates.A serving of most carbohydrate-rich foods contains about 15 g of carbohydrates. The following list includes serving sizes of carbohydrate-rich foods that provide 15 g ofcarbohydrate per serving:   1 slice of bread (1 oz) or 1 six-inch tortilla.    of a hamburger bun or English muffin.  4-6 crackers.   cup unsweetened dry cereal.    cup hot cereal.   cup rice or pasta.    cup mashed potatoes or  of a large baked potato.  1 cup fresh fruit or one small piece of fruit.    cup canned or frozen fruit or fruit juice.  1 cup milk.   cup plain fat-free yogurt or yogurt sweetened with artificial sweeteners.   cup cooked dried beans or starchy vegetable, such as peas, corn, or potatoes.  Decide the number of standard-size servings that you will eat. Multiply that number of servings by 15 (the grams of carbohydrates in that serving). For example, if you eat 2 cups of strawberries, you will have eaten 2 servings and 30 g of carbohydrates (2 servings x 15 g = 30 g). For foods such as soups and casseroles, in which more than one food is mixed in, you will need to count the carbohydrates in each food that is included. EXAMPLE OF CARBOHYDRATE COUNTING Sample Dinner  3 oz chicken breast.   cup of brown rice.   cup of corn.  1 cup milk.   1 cup strawberries with   sugar-free whipped topping.  Carbohydrate Calculation Step 1: Identify the foods that contain carbohydrates:   Rice.   Corn.   Milk.   Strawberries. Step 2:Calculate the number of servings eaten of each:   2 servings of rice.   1 serving of corn.   1 serving of milk.   1 serving of strawberries. Step 3: Multiply each of those number of servings by 15 g:   2 servings of rice x 15 g = 30 g.   1 serving of corn x 15 g = 15 g.   1 serving of milk x 15 g = 15 g.   1 serving of strawberries x 15 g = 15 g. Step 4: Add  together all of the amounts to find the total grams of carbohydrates eaten: 30 g + 15 g + 15 g + 15 g = 75 g. Document Released: 07/24/2005 Document Revised: 12/08/2013 Document Reviewed: 06/20/2013 ExitCare Patient Information 2015 [employer]. This information is not intended to replace advice given to you by your health care provider. Make sure you discuss any questions you have with your health care provider. Diabetes and Exercise Exercising regularly is important. It is not just about losing weight. It has many health benefits, such as:  Improving your overall fitness, flexibility, and endurance.  Increasing your bone density.  Helping with weight control.  Decreasing your body fat.  Increasing your muscle strength.  Reducing stress and tension.  Improving your overall health. People with diabetes who exercise gain additional benefits because exercise:  Reduces appetite.  Improves the body's use of blood sugar (glucose).  Helps lower or control blood glucose.  Decreases blood pressure.  Helps control blood lipids (such as cholesterol and triglycerides).  Improves the body's use of the hormone insulin by:  Increasing the body's insulin sensitivity.  Reducing the body's insulin needs.  Decreases the risk for heart disease because exercising:  Lowers cholesterol and triglycerides levels.  Increases the levels of good cholesterol (such as high-density lipoproteins [HDL]) in the body.  Lowers blood glucose levels. YOUR ACTIVITY PLAN  Choose an activity that you enjoy and set realistic goals. Your health care provider or diabetes educator can help you make an activity plan that works for you. Exercise regularly as directed by your health care provider. This includes:  Performing resistance training twice a week such as push-ups, sit-ups, lifting weights, or using resistance bands.  Performing 150 minutes of cardio exercises each week such as walking, running, or  playing sports.  Staying active and spending no more than 90 minutes at one time being inactive. Even short bursts of exercise are good for you. Three 10-minute sessions spread throughout the day are just as beneficial as a single 30-minute session. Some exercise ideas include:  Taking the dog for a walk.  Taking the stairs instead of the elevator.  Dancing to your favorite song.  Doing an exercise video.  Doing your favorite exercise with a friend. RECOMMENDATIONS FOR EXERCISING WITH TYPE 1 OR TYPE 2 DIABETES   Check your blood glucose before exercising. If blood glucose levels are greater than 240 mg/dL, check for urine ketones. Do not exercise if ketones are present.  Avoid injecting insulin into areas of the body that are going to be exercised. For example, avoid injecting insulin into:  The arms when playing tennis.  The legs when jogging.  Keep a record of:  Food intake before and after you exercise.  Expected peak times of insulin action.  Blood   glucose levels before and after you exercise.  The type and amount of exercise you have done.  Review your records with your health care provider. Your health care provider will help you to develop guidelines for adjusting food intake and insulin amounts before and after exercising.  If you take insulin or oral hypoglycemic agents, watch for signs and symptoms of hypoglycemia. They include:  Dizziness.  Shaking.  Sweating.  Chills.  Confusion.  Drink plenty of water while you exercise to prevent dehydration or heat stroke. Body water is lost during exercise and must be replaced.  Talk to your health care provider before starting an exercise program to make sure it is safe for you. Remember, almost any type of activity is better than none. Document Released: 10/14/2003 Document Revised: 12/08/2013 Document Reviewed: 12/31/2012 [facility] Patient Information 2015 [place], [place]. This information is not intended to  replace advice given to you by your health care provider. Make sure you discuss any questions you have with your health care provider. Acute Pancreatitis Acute pancreatitis is a disease in which the pancreas becomes suddenly inflamed. The pancreas is a large gland located behind your stomach. The pancreas produces enzymes that help digest food. The pancreas also releases the hormones glucagon and insulin that help regulate blood sugar. Damage to the pancreas occurs when the digestive enzymes from the pancreas are activated and begin attacking the pancreas before being released into the intestine. Most acute attacks last a couple of days and can cause serious complications. Some people become dehydrated and develop low blood pressure. In severe cases, bleeding into the pancreas can lead to shock and can be life-threatening. The lungs, heart, and kidneys may fail. CAUSES  Pancreatitis can happen to anyone. In some cases, the cause is unknown. Most cases are caused by:  Alcohol abuse.  Gallstones. Other less common causes are:  Certain medicines.  Exposure to certain chemicals.  Infection.  Damage caused by an accident (trauma).  Abdominal surgery. SYMPTOMS   Pain in the upper abdomen that may radiate to the back.  Tenderness and swelling of the abdomen.  Nausea and vomiting. DIAGNOSIS  Your caregiver will perform a physical exam. Blood and stool tests may be done to confirm the diagnosis. Imaging tests may also be done, such as X-rays, CT scans, or an ultrasound of the abdomen. TREATMENT  Treatment usually requires a stay in the hospital. Treatment may include:  Pain medicine.  Fluid replacement through an intravenous line (IV).  Placing a tube in the stomach to remove stomach contents and control vomiting.  Not eating for 3 or 4 days. This gives your pancreas a rest, because enzymes are not being produced that can cause further damage.  Antibiotic medicines if your condition is  caused by an infection.  Surgery of the pancreas or gallbladder. HOME CARE INSTRUCTIONS   Follow the diet advised by your caregiver. This may involve avoiding alcohol and decreasing the amount of fat in your diet.  Eat smaller, more frequent meals. This reduces the amount of digestive juices the pancreas produces.  Drink enough fluids to keep your urine clear or pale yellow.  Only take over-the-counter or prescription medicines as directed by your caregiver.  Avoid drinking alcohol if it caused your condition.  Do not smoke.  Get plenty of rest.  Check your blood sugar at home as directed by your caregiver.  Keep all follow-up appointments as directed by your caregiver. SEEK MEDICAL CARE IF:   You do not recover as quickly  as expected.  You develop new or worsening symptoms.  You have persistent pain, weakness, or nausea.  You recover and then have another episode of pain. SEEK IMMEDIATE MEDICAL CARE IF:   You are unable to eat or keep fluids down.  Your pain becomes severe.  You have a fever or persistent symptoms for more than 2 to 3 days.  You have a fever and your symptoms suddenly get worse.  Your skin or the white part of your eyes turn yellow (jaundice).  You develop vomiting.  You feel dizzy, or you faint.  Your blood sugar is high (over 300 mg/dL). MAKE SURE YOU:   Understand these instructions.  Will watch your condition.  Will get help right away if you are not doing well or get worse. Document Released: 07/24/2005 Document Revised: 01/23/2012 Document Reviewed: 11/02/2011 [facility] Patient Information 2015 [employer], [place]. This information is not intended to replace advice given to you by your health care provider. Make sure you discuss any questions you have with your health care provider.

## 2014-07-13 NOTE — Progress Notes (Signed)
Pt here to f/u DM with insulin dependence  Requesting refill Creon Denies nausea,vomitting and dizziness Compliant with taking insulin daily with proper dieting Admits to smoking 7 cigarettes/day Declined Flu/PNA vaccine Denies blurred vision or numb/tingling

## 2014-07-13 NOTE — Progress Notes (Signed)
Patient ID: Rodney Barber, male   DOB: 08/14/1965, 48 y.o.   MRN: [id number]   Rodney Barber, is a 48 y.o. male  HRC:[id number]  WOE:[id number]  DOB - 1966-04-15  Chief Complaint  Patient presents with  . Follow-up  . Diabetes  . Medication Refill        Subjective:   Rodney Barber is a 48 y.o. male here today for a follow up visit. Patient is known to have alcohol induced chronic pancreatitis, and consequent insulin-dependent diabetes mellitus , on Creon and insulin subcutaneous, here today for follow-up visit and for refill of medications. Patient has no significant side effects of medication , claims compliant with her medications , he is eating right, exercise  And trying to do the right thing. He does not drink alcohol anymore. He continue to smoke cigarettes about half a pack per day. He denies nausea vomiting or dizziness. He declined flu vaccine and pneumonia vaccine today. Patient has No headache, No chest pain, No abdominal pain - No Nausea, No new weakness tingling or numbness, No Cough - SOB.  No problems updated.  ALLERGIES: No Known Allergies  PAST MEDICAL HISTORY: Past Medical History  Diagnosis Date  . Pancreatitis   . Diabetes mellitus     MEDICATIONS AT HOME: Prior to Admission medications   Medication Sig Start Date End Date Taking? Authorizing Provider  insulin NPH-regular Human (NOVOLIN 70/30) (70-30) 100 UNIT/ML injection Inject 15 units in the morning and 20 units in the evening under the skin. 01/14/14  Yes Tresa Garter, MD  lipase/protease/amylase (CREON) 12000 UNITS CPEP capsule Take 1 capsule (12,000 Units total) by mouth 3 (three) times daily with meals. 07/13/14  Yes Tresa Garter, MD  Blood Glucose Monitoring Suppl (TRUERESULT BLOOD GLUCOSE) W/DEVICE KIT uad 11/04/13   Tresa Garter, MD  glucose blood (TRUETEST TEST) test strip Use as instructed 11/04/13   Tresa Garter, MD  TRUEPLUS LANCETS 28G MISC uad 11/04/13    Tresa Garter, MD     Objective:   Filed Vitals:   07/13/14 0935  BP: 145/75  Pulse: 82  Temp: 98.3 F (36.8 C)  TempSrc: Oral  Resp: 18  Height: _0  (1.727 m)  Weight: 125 lb (56.7 kg)  SpO2: 98%    Exam General appearance : Awake, alert, not in any distress. Speech Clear. Not toxic looking, thin built,  Bilateral parotid  fullness HEENT: Atraumatic and Normocephalic, pupils equally reactive to light and accomodation Neck: supple, no JVD. No cervical lymphadenopathy.  Chest:Good air entry bilaterally, no added sounds  CVS: S1 S2 regular, no murmurs.  Abdomen: Bowel sounds present, Non tender and not distended with no gaurding, rigidity or rebound. Extremities: B/L Lower Ext shows no edema, both legs are warm to touch Neurology: Awake alert, and oriented X 3, CN II-XII intact, Non focal Skin:No Rash Wounds:N/A  Data Review Lab Results  Component Value Date   HGBA1C 9.2 07/13/2014   HGBA1C 11.6 11/04/2013   HGBA1C 5.2 07/10/2013     Assessment & Plan   1. Type 2 diabetes mellitus without complication  - HgB O0B is 9.2% today  Down from 11.6% previously   We'll continue current insulin regimen  Diabetic diet  Aim for 2-3 Carb Choices per meal (30-45 grams) +/- 1 either way  Aim for 0-15 Carbs per snack if hungry  Include protein in moderation with your meals and snacks  Consider reading food labels for Total Carbohydrate and Fat Grams of foods  Consider checking BG at alternate times per day  Continue taking medication as directed Fruit Punch - find one with no sugar  Measure and decrease portions of carbohydrate foods  Make your plate and don't go back for seconds  - Glucose (CBG)  2. Alcohol-induced chronic pancreatitis Refill - lipase/protease/amylase (CREON) 12000 UNITS CPEP capsule; Take 1 capsule (12,000 Units total) by mouth 3 (three) times daily with meals.  Dispense: 270 capsule; Refill: 3    Patient was extensively counseled about  smoking cessation.   Return in about 3 months (around 10/12/2014), or if symptoms worsen or fail to improve, for Hemoglobin A1C and Follow up, DM, Follow up Pain and comorbidities.  The patient was given clear instructions to go to ER or return to medical center if symptoms don't improve, worsen or new problems develop. The patient verbalized understanding. The patient was told to call to get lab results if they haven't heard anything in the next week.   This note has been created with Surveyor, quantity. Any transcriptional errors are unintentional.    Angelica Chessman, MD, [place], [place], [place] and [place], [place]   07/13/2014, 10:25 AM

## 2014-07-28 ENCOUNTER — Ambulatory Visit: Payer: Self-pay | Attending: Internal Medicine

## 2014-08-05 ENCOUNTER — Other Ambulatory Visit: Payer: Self-pay | Admitting: Internal Medicine

## 2014-08-05 DIAGNOSIS — K860 Alcohol-induced chronic pancreatitis: Secondary | ICD-10-CM

## 2014-08-05 MED ORDER — PANCRELIPASE (LIP-PROT-AMYL) 12000-38000 UNITS PO CPEP: 12000.0000 [IU] | ORAL_CAPSULE | Freq: Three times a day (TID) | ORAL | Status: DC

## 2014-11-17 ENCOUNTER — Other Ambulatory Visit: Payer: Self-pay | Admitting: Internal Medicine

## 2014-11-24 ENCOUNTER — Telehealth: Payer: Self-pay | Admitting: Internal Medicine

## 2014-11-24 MED ORDER — GLUCOSE BLOOD VI STRP: ORAL_STRIP | Status: DC

## 2014-11-24 NOTE — Telephone Encounter (Signed)
Patient called to request a med refill for his test strips, patient uses our pharmacy. Please f/u with pt.

## 2015-02-12 ENCOUNTER — Other Ambulatory Visit: Payer: Self-pay | Admitting: Internal Medicine

## 2015-02-12 DIAGNOSIS — E1110 Type 2 diabetes mellitus with ketoacidosis without coma: Secondary | ICD-10-CM

## 2015-02-18 ENCOUNTER — Other Ambulatory Visit: Payer: Self-pay | Admitting: Internal Medicine

## 2015-02-18 DIAGNOSIS — E1165 Type 2 diabetes mellitus with hyperglycemia: Secondary | ICD-10-CM

## 2015-05-11 ENCOUNTER — Other Ambulatory Visit: Payer: Self-pay | Admitting: *Deleted

## 2015-05-11 MED ORDER — INSULIN NPH ISOPHANE & REGULAR (70-30) 100 UNIT/ML ~~LOC~~ SUSP: SUBCUTANEOUS | Status: DC

## 2015-05-11 NOTE — Telephone Encounter (Signed)
Per Dr. Joana Reamer is approved. Pt has appt scheduled

## 2015-05-13 ENCOUNTER — Ambulatory Visit: Payer: Self-pay | Attending: Internal Medicine | Admitting: Internal Medicine

## 2015-05-13 ENCOUNTER — Encounter: Payer: Self-pay | Admitting: Internal Medicine

## 2015-05-13 VITALS — BP 130/60 | HR 59 | Temp 97.5°F | Resp 18 | Ht 68.0 in | Wt 132.2 lb

## 2015-05-13 DIAGNOSIS — E1165 Type 2 diabetes mellitus with hyperglycemia: Secondary | ICD-10-CM

## 2015-05-13 DIAGNOSIS — Z794 Long term (current) use of insulin: Secondary | ICD-10-CM

## 2015-05-13 DIAGNOSIS — IMO0001 Reserved for inherently not codable concepts without codable children: Secondary | ICD-10-CM

## 2015-05-13 DIAGNOSIS — K860 Alcohol-induced chronic pancreatitis: Secondary | ICD-10-CM

## 2015-05-13 DIAGNOSIS — F17210 Nicotine dependence, cigarettes, uncomplicated: Secondary | ICD-10-CM | POA: Insufficient documentation

## 2015-05-13 LAB — LIPID PANEL
Cholesterol: 139 mg/dL (ref 125–200)
HDL: 35 mg/dL — ABNORMAL LOW (ref >=40)
LDL CALC: 89 mg/dL (ref <130)
Total CHOL/HDL Ratio: 4 Ratio (ref <=5.0)
Triglycerides: 75 mg/dL (ref <150)
VLDL: 15 mg/dL (ref <30)

## 2015-05-13 LAB — COMPLETE METABOLIC PANEL WITH GFR
ALBUMIN: 4 g/dL (ref 3.6–5.1)
ALK PHOS: 87 U/L (ref 40–115)
ALT: 40 U/L (ref 9–46)
AST: 46 U/L — ABNORMAL HIGH (ref 10–35)
BILIRUBIN TOTAL: 0.3 mg/dL (ref 0.2–1.2)
BUN: 8 mg/dL (ref 7–25)
CO2: 30 mmol/L (ref 20–31)
CREATININE: 0.84 mg/dL (ref 0.70–1.33)
Calcium: 9.1 mg/dL (ref 8.6–10.3)
Chloride: 105 mmol/L (ref 98–110)
GLUCOSE: 113 mg/dL — AB (ref 65–99)
Potassium: 4.2 mmol/L (ref 3.5–5.3)
SODIUM: 141 mmol/L (ref 135–146)
TOTAL PROTEIN: 6.2 g/dL (ref 6.1–8.1)

## 2015-05-13 LAB — POCT GLYCOSYLATED HEMOGLOBIN (HGB A1C): HEMOGLOBIN A1C: 10.2

## 2015-05-13 LAB — GLUCOSE, POCT (MANUAL RESULT ENTRY): POC GLUCOSE: 150 mg/dL — AB (ref 70–99)

## 2015-05-13 MED ORDER — INSULIN NPH ISOPHANE & REGULAR (70-30) 100 UNIT/ML ~~LOC~~ SUSP: SUBCUTANEOUS | Status: DC

## 2015-05-13 MED ORDER — PANCRELIPASE (LIP-PROT-AMYL) 12000-38000 UNITS PO CPEP: 12000.0000 [IU] | ORAL_CAPSULE | Freq: Three times a day (TID) | ORAL | Status: DC

## 2015-05-13 NOTE — Progress Notes (Signed)
Subjective:    Patient ID: Rodney Barber, male    DOB: 1966-01-02, 49 y.o.   MRN: [id number]  HPI  Rodney Barber is 49 year old male, with Insulin dependent diabetes. Significant history includes chronic pancreatitis - alcohol induced, common bile duct stricture, liver lesion right lobe, and hypokalemia. Patient presents with no significant complaints today, no chest pain, no abdominal pain, no headache, no nausea and no new weakness or tingling, no cough, no SOB. Patient's last A1C was 9.2% on 12/15, patient states he is taking Novolin 15 u after breakfast and 20u after dinner. Patient states he smoked crack cocaine several days ago. Patient smokes cigarettes 1/2 pack a day for last 22 years. 11 pack year history.   Review of Systems  Constitutional: Negative.   HENT: Negative.   Eyes: Negative.   Respiratory: Negative.   Cardiovascular: Negative.   Gastrointestinal: Negative.   Endocrine: Negative.   Genitourinary: Negative.   Musculoskeletal: Negative.   Skin: Negative.   Allergic/Immunologic: Negative.   Neurological: Negative.   Hematological: Negative.   Psychiatric/Behavioral: Negative.    Social History   Social History  . Marital Status: Single    Spouse Name: N/A  . Number of Children: N/A  . Years of Education: N/A   Occupational History  . Not on file.   Social History Main Topics  . Smoking status: Current Every Day Smoker -- 0.25 packs/day for 30 years    Types: Cigarettes  . Smokeless tobacco: Never Used     Comment: 6 cigarett /day  . Alcohol Use: No  . Drug Use: Yes    Special: "Crack" cocaine, Other-see comments     Comment: 07/10/13 - "every now and then"  . Sexual Activity: Not on file   Other Topics Concern  . Not on file   Social History Narrative     BP 130/60 mmHg  Pulse 59  Temp(Src) 97.5 F (36.4 C) (Oral)  Resp 18  Ht 5\' 8"  (1.727 m)  Wt 132 lb 3.2 oz (59.966 kg)  BMI 20.11 kg/m2  SpO2 100% Objective:   Physical Exam    Constitutional: He is oriented to person, place, and time. He appears well-developed.  HENT:  Head: Normocephalic and atraumatic.  Right Ear: External ear normal.  Left Ear: External ear normal.  Nose: Nose normal.  Mouth/Throat: Oropharynx is clear and moist.  Eyes: EOM are normal. Pupils are equal, round, and reactive to light.  Neck: Normal range of motion. Neck supple.  Cardiovascular: Normal rate, regular rhythm, normal heart sounds and intact distal pulses.   Pulmonary/Chest: Effort normal and breath sounds normal.  Musculoskeletal: Normal range of motion.  Neurological: He is alert and oriented to person, place, and time.  Skin: Skin is warm and dry.  Psychiatric: He has a normal mood and affect. His behavior is normal. Judgment and thought content normal.    Assessment & Plan:  Finnlee was seen today for diabetes mellitus.  Diagnoses and all orders for this visit:  Uncontrolled insulin dependent diabetes mellitus (HCC) -     Glucose (CBG) -     POCT A1C -     Microalbumin/Creatinine Ratio, Urine -     COMPLETE METABOLIC PANEL WITH GFR -     Lipid panel -     insulin NPH-regular Human (NOVOLIN 70/30) (70-30) 100 UNIT/ML injection; INJECT 20 UNITS INTO THE SKIN EVERY MORNING AND 20 UNITS IN THE EVENING. (MAP)       -  Patient was counseled extensively on importance of blood sugar control, importance of smoking cessation, diet and exercise. Patient verbalizes understanding.   Alcohol-induced chronic pancreatitis (HCC)  -     lipase/protease/amylase (CREON) 12000 UNITS CPEP capsule; Take 1 capsule (12,000 Units total) by mouth 3 (three) times daily with meals.       -     Patient counseled extensively on importance of continuing to abstain from alcohol.    Cordarrius was counseled on the dangers of tobacco use, and was advised to quit. Reviewed strategies to maximize success, including removing cigarettes and smoking materials from environment, stress management and support of  family/friends.  Patient refuses influenza vaccine at this time.   Return in about 6 months (around 11/08/2015) for Annual Physical, Routine Follow Up.  The patient was given clear instructions to go to ER or return to medical center if symptoms don't improve, worsen or new problems develop. The patient verbalized understanding. The patient was told to call to get lab results if they haven't heard anything in the next week.   Stephanie Coup, RN, BSN,. AGNP - APP  Student [employer] [facility] 05/13/2015 11:19 AM  Evaluation and management procedures were performed by the Advanced Practitioner under my supervision and collaboration. I have reviewed the Advanced Practitioner's note and chart, and I agree with the management and plan.  Jeanann Lewandowsky, MD, MHA, CPE, FACP, FAAP [facility] and [facility] [place], [place] [phone]

## 2015-05-13 NOTE — Progress Notes (Signed)
Patient here for DM F/U  Patient denies pain at this time.  Cocaine use about 20 days ago.  Patient has taken medication this morning and has eaten.  Recheck Manual LA BP- 130/60

## 2015-05-13 NOTE — Patient Instructions (Signed)
Diabetes and Exercise Exercising regularly is important. It is not just about losing weight. It has many health benefits, such as:  Improving your overall fitness, flexibility, and endurance.  Increasing your bone density.  Helping with weight control.  Decreasing your body fat.  Increasing your muscle strength.  Reducing stress and tension.  Improving your overall health. People with diabetes who exercise gain additional benefits because exercise:  Reduces appetite.  Improves the body's use of blood sugar (glucose).  Helps lower or control blood glucose.  Decreases blood pressure.  Helps control blood lipids (such as cholesterol and triglycerides).  Improves the body's use of the hormone insulin by:  Increasing the body's insulin sensitivity.  Reducing the body's insulin needs.  Decreases the risk for heart disease because exercising:  Lowers cholesterol and triglycerides levels.  Increases the levels of good cholesterol (such as high-density lipoproteins [HDL]) in the body.  Lowers blood glucose levels. YOUR ACTIVITY PLAN  Choose an activity that you enjoy, and set realistic goals. To exercise safely, you should begin practicing any new physical activity slowly, and gradually increase the intensity of the exercise over time. Your health care provider or diabetes educator can help create an activity plan that works for you. General recommendations include:  Encouraging children to engage in at least 60 minutes of physical activity each day.  Stretching and performing strength training exercises, such as yoga or weight lifting, at least 2 times per week.  Performing a total of at least 150 minutes of moderate-intensity exercise each week, such as brisk walking or water aerobics.  Exercising at least 3 days per week, making sure you allow no more than 2 consecutive days to pass without exercising.  Avoiding long periods of inactivity (90 minutes or more). When you  have to spend an extended period of time sitting down, take frequent breaks to walk or stretch. RECOMMENDATIONS FOR EXERCISING WITH TYPE 1 OR TYPE 2 DIABETES   Check your blood glucose before exercising. If blood glucose levels are greater than 240 mg/dL, check for urine ketones. Do not exercise if ketones are present.  Avoid injecting insulin into areas of the body that are going to be exercised. For example, avoid injecting insulin into:  The arms when playing tennis.  The legs when jogging.  Keep a record of:  Food intake before and after you exercise.  Expected peak times of insulin action.  Blood glucose levels before and after you exercise.  The type and amount of exercise you have done.  Review your records with your health care provider. Your health care provider will help you to develop guidelines for adjusting food intake and insulin amounts before and after exercising.  If you take insulin or oral hypoglycemic agents, watch for signs and symptoms of hypoglycemia. They include:  Dizziness.  Shaking.  Sweating.  Chills.  Confusion.  Drink plenty of water while you exercise to prevent dehydration or heat stroke. Body water is lost during exercise and must be replaced.  Talk to your health care provider before starting an exercise program to make sure it is safe for you. Remember, almost any type of activity is better than none.   This information is not intended to replace advice given to you by your health care provider. Make sure you discuss any questions you have with your health care provider.   Document Released: 10/14/2003 Document Revised: 12/08/2014 Document Reviewed: 12/31/2012 Elsevier Interactive Patient Education 2016 Else[employer] Basic Carbohydrate Counting for Diabetes Mellitus Carbohydrate counting   is a method for keeping track of the amount of carbohydrates you eat. Eating carbohydrates naturally increases the level of sugar (glucose) in your  blood, so it is important for you to know the amount that is okay for you to have in every meal. Carbohydrate counting helps keep the level of glucose in your blood within normal limits. The amount of carbohydrates allowed is different for every person. A dietitian can help you calculate the amount that is right for you. Once you know the amount of carbohydrates you can have, you can count the carbohydrates in the foods you want to eat. Carbohydrates are found in the following foods:  Grains, such as breads and cereals.  Dried beans and soy products.  Starchy vegetables, such as potatoes, peas, and corn.  Fruit and fruit juices.  Milk and yogurt.  Sweets and snack foods, such as cake, cookies, candy, chips, soft drinks, and fruit drinks. CARBOHYDRATE COUNTING There are two ways to count the carbohydrates in your food. You can use either of the methods or a combination of both. Reading the "Nutrition Facts" on Packaged Food The "Nutrition Facts" is an area that is included on the labels of almost all packaged food and beverages in the [place]. It includes the serving size of that food or beverage and information about the nutrients in each serving of the food, including the grams (g) of carbohydrate per serving.  Decide the number of servings of this food or beverage that you will be able to eat or drink. Multiply that number of servings by the number of grams of carbohydrate that is listed on the label for that serving. The total will be the amount of carbohydrates you will be having when you eat or drink this food or beverage. Learning Standard Serving Sizes of Food When you eat food that is not packaged or does not include "Nutrition Facts" on the label, you need to measure the servings in order to count the amount of carbohydrates.A serving of most carbohydrate-rich foods contains about 15 g of carbohydrates. The following list includes serving sizes of carbohydrate-rich foods that  provide 15 g ofcarbohydrate per serving:   1 slice of bread (1 oz) or 1 six-inch tortilla.    of a hamburger bun or English muffin.  4-6 crackers.   cup unsweetened dry cereal.    cup hot cereal.   cup rice or pasta.    cup mashed potatoes or  of a large baked potato.  1 cup fresh fruit or one small piece of fruit.    cup canned or frozen fruit or fruit juice.  1 cup milk.   cup plain fat-free yogurt or yogurt sweetened with artificial sweeteners.   cup cooked dried beans or starchy vegetable, such as peas, corn, or potatoes.  Decide the number of standard-size servings that you will eat. Multiply that number of servings by 15 (the grams of carbohydrates in that serving). For example, if you eat 2 cups of strawberries, you will have eaten 2 servings and 30 g of carbohydrates (2 servings x 15 g = 30 g). For foods such as soups and casseroles, in which more than one food is mixed in, you will need to count the carbohydrates in each food that is included. EXAMPLE OF CARBOHYDRATE COUNTING Sample Dinner  3 oz chicken breast.   cup of brown rice.   cup of corn.  1 cup milk.   1 cup strawberries with sugar-free whipped topping.  Carbohydrate Calculation Step   1: Identify the foods that contain carbohydrates:   Rice.   Corn.   Milk.   Strawberries. Step 2:Calculate the number of servings eaten of each:   2 servings of rice.   1 serving of corn.   1 serving of milk.   1 serving of strawberries. Step 3: Multiply each of those number of servings by 15 g:   2 servings of rice x 15 g = 30 g.   1 serving of corn x 15 g = 15 g.   1 serving of milk x 15 g = 15 g.   1 serving of strawberries x 15 g = 15 g. Step 4: Add together all of the amounts to find the total grams of carbohydrates eaten: 30 g + 15 g + 15 g + 15 g = 75 g.   This information is not intended to replace advice given to you by your health care provider. Make sure you  discuss any questions you have with your health care provider.   Document Released: 07/24/2005 Document Revised: 08/14/2014 Document Reviewed: 06/20/2013 Elsevier Interactive Patient Education 2016 Elsevier Inc.  

## 2015-05-14 LAB — MICROALBUMIN / CREATININE URINE RATIO
Creatinine, Urine: 128.1 mg/dL
Microalb Creat Ratio: 29.7 mg/g (ref 0.0–30.0)
Microalb, Ur: 3.8 mg/dL — ABNORMAL HIGH

## 2015-05-27 ENCOUNTER — Ambulatory Visit: Payer: Self-pay | Attending: Internal Medicine | Admitting: Pharmacist

## 2015-05-27 DIAGNOSIS — E1165 Type 2 diabetes mellitus with hyperglycemia: Secondary | ICD-10-CM

## 2015-05-27 DIAGNOSIS — E119 Type 2 diabetes mellitus without complications: Secondary | ICD-10-CM | POA: Insufficient documentation

## 2015-05-27 DIAGNOSIS — IMO0001 Reserved for inherently not codable concepts without codable children: Secondary | ICD-10-CM

## 2015-05-27 DIAGNOSIS — Z794 Long term (current) use of insulin: Secondary | ICD-10-CM | POA: Insufficient documentation

## 2015-05-27 MED ORDER — GLUCOSE BLOOD VI STRP: ORAL_STRIP | Status: DC

## 2015-05-27 NOTE — Progress Notes (Signed)
S:    Patient arrives in good spirits.  Presents for diabetes follow up.   Patient reports adherence with medications. Current diabetes medications include NPH-regular 70/30 insulin 20 units twice a day.   Patient reports that he takes his evening dose of insulin after he eats.   Patient denies hypoglycemic events.  Patient reported dietary habits: he eats whatever he receives but he tries to avoid sweets and breads. He reports drinking water and coffee. He denies drinking alcohol.   Patient reported exercise habits: walking   Patient reports nocturia 1 time per night.  Patient reports neuropathy. Patient reports chronic visual changes. Patient reports self foot exams.    O:  Lab Results  Component Value Date   HGBA1C 10.20 05/13/2015   Patient does not have his meter or log book with him   Home fasting CBG: 80s-120s  2 hour post-prandial/random CBG: 200s-300s.  A/P: Diabetes currently uncontrolled based on A1c of 10.2 and reported home CBGs. Patient denies hypoglycemic events and is able to verbalize appropriate hypoglycemia management plan.  Patient reports adherence with medication. Control is suboptimal due to dietary indiscretion and incorrect use of insulin.  Continue 70/30 20 units twice a day. Patient to take insulin 30 minutes prior to meals instead of after. I think that his is causing high blood glucose readings after he eats because the regular insulin has not had enough time to work - and I expect this is why his readings look better in the morning because of the late action of his insulin. Patient will keep track of his CBGs on a log book to bring to his next clinic visit. Will adjust insulin at that time as needed.   Next A1C anticipated January 2017.    Written patient instructions provided.  Total time in face to face counseling 20 minutes.   Follow up in Pharmacist Clinic Visit in 2-3 weeks with log book for CBG review.

## 2015-05-27 NOTE — Patient Instructions (Signed)
Continue taking 20 units of your insulin twice a day  Take your insulin 30 minutes before you eat.  Come back and see me in 2-3 weeks with a log book or your meter.

## 2015-06-01 ENCOUNTER — Telehealth: Payer: Self-pay | Admitting: *Deleted

## 2015-06-01 NOTE — Telephone Encounter (Signed)
Phone rang continuously, medical assistant was unable to leave a message.

## 2015-06-17 ENCOUNTER — Ambulatory Visit: Payer: Self-pay | Attending: Internal Medicine | Admitting: Pharmacist

## 2015-06-17 DIAGNOSIS — Z794 Long term (current) use of insulin: Secondary | ICD-10-CM | POA: Insufficient documentation

## 2015-06-17 DIAGNOSIS — E1165 Type 2 diabetes mellitus with hyperglycemia: Secondary | ICD-10-CM | POA: Insufficient documentation

## 2015-06-17 DIAGNOSIS — IMO0001 Reserved for inherently not codable concepts without codable children: Secondary | ICD-10-CM

## 2015-06-17 MED ORDER — INSULIN NPH ISOPHANE & REGULAR (70-30) 100 UNIT/ML ~~LOC~~ SUSP: SUBCUTANEOUS | Status: DC

## 2015-06-17 NOTE — Progress Notes (Signed)
S:    Patient arrives in good spirits but would like to get out quickly to go to the pharmacy to work on the paperwork for assistance with Creon.  Presents for diabetes follow up.   Patient reports adherence with medications. Current diabetes medications include insulin NPH-regular (70/30) 20 units twice a day.   Patient denies hypoglycemic events. However, he reports that if he did have an event that he would drink a regular soda.   Patient reports nocturia about once per night.  Patient denies neuropathy. Patient denies visual changes. Patient reports self foot exams.    O:  Lab Results  Component Value Date   HGBA1C 10.20 05/13/2015    Home fasting CBG: 75 - 110s  2 hour post-prandial/random CBG: 230s-260s.  A/P: Diabetes currently uncontrolled based on A1c of 10.2 but under improved control with fastings at goal.   Patient denies hypoglycemic events and is able to verbalize appropriate hypoglycemia management plan.  Patient reports adherence with medication. Control is suboptimal due to inadequate post-prandial coverage.  Taking the insulin appropriately (prior to meals) improved his blood glucose readings. Increased insulin 70/30 to 22 units in the morning and continued 20 units in the evening. Patient was very hesitant to increase insulin dose at all so will titrate slowly to obtain post-prandial glucoses at goal. Reviewed blood glucose reading goals with patient and the importance of lowering his blood glucose. Patient verbalized understanding.   Next A1C anticipated January 2017.    Written patient instructions provided.  Total time in face to face counseling 20 minutes.   Follow up in Pharmacist Clinic Visit in two weeks.

## 2015-06-17 NOTE — Patient Instructions (Signed)
Thanks for coming to see me.  The goal for your blood sugar at dinner is <180.  Increase your insulin to 22 units in the morning. Keep taking 20 units at night.  Come back and see me in two weeks

## 2015-06-30 ENCOUNTER — Ambulatory Visit: Payer: Self-pay | Attending: Internal Medicine

## 2015-07-07 ENCOUNTER — Encounter: Payer: Self-pay | Admitting: Pharmacist

## 2015-07-28 ENCOUNTER — Other Ambulatory Visit: Payer: Self-pay | Admitting: Internal Medicine

## 2015-07-28 DIAGNOSIS — Z794 Long term (current) use of insulin: Principal | ICD-10-CM

## 2015-07-28 DIAGNOSIS — E1165 Type 2 diabetes mellitus with hyperglycemia: Principal | ICD-10-CM

## 2015-07-28 DIAGNOSIS — IMO0001 Reserved for inherently not codable concepts without codable children: Secondary | ICD-10-CM

## 2015-07-28 MED ORDER — INSULIN NPH ISOPHANE & REGULAR (70-30) 100 UNIT/ML ~~LOC~~ SUSP: SUBCUTANEOUS | Status: DC

## 2015-08-13 ENCOUNTER — Other Ambulatory Visit: Payer: Self-pay

## 2015-08-13 DIAGNOSIS — IMO0001 Reserved for inherently not codable concepts without codable children: Secondary | ICD-10-CM

## 2015-08-13 DIAGNOSIS — E1165 Type 2 diabetes mellitus with hyperglycemia: Principal | ICD-10-CM

## 2015-08-13 DIAGNOSIS — Z794 Long term (current) use of insulin: Principal | ICD-10-CM

## 2015-08-13 MED ORDER — INSULIN NPH ISOPHANE & REGULAR (70-30) 100 UNIT/ML ~~LOC~~ SUSP: SUBCUTANEOUS | Status: DC

## 2015-08-27 MED FILL — $novoLIN 70/30 100 UNITS/ML: (70-30) 100 | 25 days supply | Qty: 10 | Fill #2

## 2015-09-29 MED FILL — NOVOLIN 70/30 100 UNITS/ML: (70-30) 100 | 25 days supply | Qty: 10 | Fill #3

## 2015-10-25 MED FILL — CREON DR 12,000 UNITS CAP: 12000 | 33 days supply | Qty: 100 | Fill #1

## 2015-11-08 MED FILL — NOVOLIN 70/30 100 UNITS/ML: (70-30) 100 | 25 days supply | Qty: 10 | Fill #4

## 2015-11-22 ENCOUNTER — Other Ambulatory Visit: Payer: Self-pay | Admitting: Internal Medicine

## 2015-11-22 DIAGNOSIS — K860 Alcohol-induced chronic pancreatitis: Secondary | ICD-10-CM

## 2015-11-22 MED ORDER — PANCRELIPASE (LIP-PROT-AMYL) 12000-38000 UNITS PO CPEP: 12000.0000 [IU] | ORAL_CAPSULE | Freq: Three times a day (TID) | ORAL | Status: DC

## 2015-12-20 MED FILL — NOVOLIN 70/30 100 UNITS/ML: (70-30) 100 | 25 days supply | Qty: 10 | Fill #5

## 2016-01-31 MED FILL — NOVOLIN 70/30 100 UNITS/ML: (70-30) 100 | 25 days supply | Qty: 10 | Fill #6

## 2016-03-13 MED FILL — NOVOLIN 70/30 100 UNITS/ML: (70-30) 100 | 25 days supply | Qty: 10 | Fill #7

## 2016-05-01 MED FILL — NOVOLIN 70/30 100 UNITS/ML: (70-30) 100 | 25 days supply | Qty: 10 | Fill #8

## 2016-06-19 ENCOUNTER — Other Ambulatory Visit: Payer: Self-pay | Admitting: Internal Medicine

## 2016-06-19 DIAGNOSIS — E1165 Type 2 diabetes mellitus with hyperglycemia: Principal | ICD-10-CM

## 2016-06-19 DIAGNOSIS — K860 Alcohol-induced chronic pancreatitis: Secondary | ICD-10-CM

## 2016-06-19 DIAGNOSIS — IMO0001 Reserved for inherently not codable concepts without codable children: Secondary | ICD-10-CM

## 2016-06-19 DIAGNOSIS — Z794 Long term (current) use of insulin: Principal | ICD-10-CM

## 2016-06-19 MED FILL — !NOVOLIN 70/30 100 UNITS/ML: (70-30) 100 | 23 days supply | Qty: 10 | Fill #0

## 2016-07-26 MED FILL — !NOVOLIN 70/30 100 UNITS/ML: (70-30) 100 | 23 days supply | Qty: 10 | Fill #1

## 2016-07-28 ENCOUNTER — Other Ambulatory Visit: Payer: Self-pay | Admitting: *Deleted

## 2016-07-28 DIAGNOSIS — Z794 Long term (current) use of insulin: Principal | ICD-10-CM

## 2016-07-28 DIAGNOSIS — E1165 Type 2 diabetes mellitus with hyperglycemia: Principal | ICD-10-CM

## 2016-07-28 DIAGNOSIS — IMO0001 Reserved for inherently not codable concepts without codable children: Secondary | ICD-10-CM

## 2016-07-28 MED ORDER — INSULIN NPH ISOPHANE & REGULAR (70-30) 100 UNIT/ML ~~LOC~~ SUSP: SUBCUTANEOUS | 3 refills | Status: DC

## 2016-07-28 NOTE — Telephone Encounter (Signed)
PRINTED FOR PASS PROGRAM 

## 2016-09-05 ENCOUNTER — Other Ambulatory Visit: Payer: Self-pay | Admitting: *Deleted

## 2016-09-05 DIAGNOSIS — K860 Alcohol-induced chronic pancreatitis: Secondary | ICD-10-CM

## 2016-09-05 MED ORDER — PANCRELIPASE (LIP-PROT-AMYL) 12000-38000 UNITS PO CPEP: 12000.0000 [IU] | ORAL_CAPSULE | Freq: Three times a day (TID) | ORAL | 3 refills | Status: DC

## 2016-09-05 NOTE — Telephone Encounter (Signed)
PRINTED FOR PASS PROGRAM 

## 2016-09-12 ENCOUNTER — Other Ambulatory Visit: Payer: Self-pay | Admitting: Internal Medicine

## 2016-09-12 DIAGNOSIS — Z794 Long term (current) use of insulin: Principal | ICD-10-CM

## 2016-09-12 DIAGNOSIS — IMO0001 Reserved for inherently not codable concepts without codable children: Secondary | ICD-10-CM

## 2016-09-12 DIAGNOSIS — E1165 Type 2 diabetes mellitus with hyperglycemia: Principal | ICD-10-CM

## 2016-09-14 MED FILL — $novoLIN 70/30 100 UNITS/ML: (70-30) 100 | 28 days supply | Qty: 20 | Fill #0

## 2016-12-17 ENCOUNTER — Emergency Department (HOSPITAL_COMMUNITY)
Admission: EM | Admit: 2016-12-17 | Discharge: 2016-12-17 | Disposition: A | Discharge status: Home / Self Care | Payer: Self-pay | Attending: Emergency Medicine | Admitting: Emergency Medicine

## 2016-12-17 ENCOUNTER — Encounter (HOSPITAL_COMMUNITY): Payer: Self-pay

## 2016-12-17 DIAGNOSIS — F17210 Nicotine dependence, cigarettes, uncomplicated: Secondary | ICD-10-CM | POA: Insufficient documentation

## 2016-12-17 DIAGNOSIS — R21 Rash and other nonspecific skin eruption: Secondary | ICD-10-CM | POA: Insufficient documentation

## 2016-12-17 DIAGNOSIS — Z794 Long term (current) use of insulin: Secondary | ICD-10-CM | POA: Insufficient documentation

## 2016-12-17 DIAGNOSIS — Z79899 Other long term (current) drug therapy: Secondary | ICD-10-CM | POA: Insufficient documentation

## 2016-12-17 DIAGNOSIS — E119 Type 2 diabetes mellitus without complications: Secondary | ICD-10-CM | POA: Insufficient documentation

## 2016-12-17 MED ORDER — FAMOTIDINE 20 MG PO TABS: 20.0000 mg | ORAL_TABLET | Freq: Two times a day (BID) | ORAL | 0 refills | Status: DC

## 2016-12-17 MED ORDER — HYDROXYZINE HCL 25 MG PO TABS: 25.0000 mg | ORAL_TABLET | Freq: Four times a day (QID) | ORAL | 0 refills | Status: DC

## 2016-12-17 MED ORDER — PREDNISONE 10 MG PO TABS: 20.0000 mg | ORAL_TABLET | Freq: Two times a day (BID) | ORAL | 0 refills | Status: DC

## 2016-12-17 NOTE — ED Triage Notes (Signed)
Onset 1 week widespread, red raised bumps, itching.  Tried Benadryl and creams with little relief.  NO new products, medications, or foods.

## 2016-12-17 NOTE — ED Provider Notes (Signed)
[facility] DEPT Provider Note   CSN: [id number] Arrival date & time: 12/17/16  1943  By signing my name below, I, Jeanell Sparrow, attest that this documentation has been prepared under the direction and in the presence of non-physician practitioner, Debroah Baller, NP. Electronically Signed: Jeanell Sparrow, Scribe. 12/17/2016. 9:26 PM.  History   Chief Complaint Chief Complaint  Patient presents with  . Rash   The history is provided by the patient. No language interpreter was used.  Rash   This is a new problem. The current episode started more than 2 days ago. The problem has been gradually worsening. The problem is associated with an unknown factor. There has been no fever. The pain is moderate. Associated symptoms include itching. He has tried antihistamines and anti-itch cream for the symptoms. The treatment provided no relief.   HPI Comments: Rodney Barber is a 51 y.o. male with a PMHx of DM who presents to the Emergency Department complaining of constant moderate generalized rash that started about a week ago. He tried Benadryl and OTC cream without any relief. He describes the worsening rash as raised, red, and itchy bumps. 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 Denies any new hygiene products, recent allergen exposure, recent travel, sore throat, fever, cough, wheezing, SOB, throat swelling, or other complaints at this time.  Past Medical History:  Diagnosis Date  . Diabetes mellitus   . Pancreatitis     Patient Active Problem List   Diagnosis Date Noted  . DM (diabetes mellitus) type 2, uncontrolled, with ketoacidosis ([place]) 09/08/2013  . Liver lesion 07/22/2013  . 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 Diabetes ([place]) 07/18/2013  . Hypokalemia 07/11/2013  . Protein-calorie malnutrition, severe ([place]) 07/11/2013  . Chronic pancreatitis ([place]) 07/10/2013  . 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 Diabetes mellitus ([place]) 07/10/2013  . Weight loss  07/10/2013  . Diarrhea 07/10/2013  . Liver lesion, right lobe 07/10/2013    Past Surgical History:  Procedure Laterality Date  . ERCP  06/27/2011   Procedure: ENDOSCOPIC RETROGRADE CHOLANGIOPANCREATOGRAPHY (ERCP);  Surgeon: Jeryl Columbia, MD;  Location: [facility];  Service: Endoscopy;  Laterality: N/A;  . ERCP W/ PLASTIC STENT PLACEMENT  03/2011       Home Medications    Prior to Admission medications   Medication Sig Start Date End Date Taking? Authorizing Provider  Blood Glucose Monitoring Suppl (TRUERESULT BLOOD GLUCOSE) W/DEVICE KIT uad 11/04/13   Jegede, Olugbemiga E, MD  CREON 12000 units CPEP capsule TAKE 1 CAPSULE BY MOUTH 3 TIMES DAILY WITH MEALS. 06/19/16   Tresa Garter, MD  famotidine (PEPCID) 20 MG tablet Take 1 tablet (20 mg total) by mouth 2 (two) times daily. 12/17/16   Ashley Murrain, NP  glucose blood (TRUE METRIX BLOOD GLUCOSE TEST) test strip Use as instructed 05/27/15   Tresa Garter, MD  hydrOXYzine (ATARAX/VISTARIL) 25 MG tablet Take 1 tablet (25 mg total) by mouth every 6 (six) hours. 12/17/16   Ashley Murrain, NP  insulin NPH-regular Human (NOVOLIN 70/30) (70-30) 100 UNIT/ML injection INJECT 22 UNITS INTO THE SKIN EVERY MORNING AND 20 UNITS IN THE EVENING. (MAP) 07/28/16   Jegede, Olugbemiga E, MD  lipase/protease/amylase (CREON) 12000 units CPEP capsule Take 1 capsule (12,000 Units total) by mouth 3 (three) times daily with meals. 09/05/16   Tresa Garter, MD  predniSONE (DELTASONE) 10 MG tablet Take 2 tablets (20 mg total) by mouth 2 (two) times daily with a meal. 12/17/16  Debroah Baller [place], NP  TRUEPLUS LANCETS 28G MISC uad 11/04/13   Tresa Garter, MD    Family History History reviewed. No pertinent family history.  Social History Social History  Substance Use Topics  . Smoking status: Current Every Day Smoker    Packs/day: 0.25    Years: 30.00    Types: Cigarettes  . Smokeless tobacco: Never Used     Comment: 6 cigarett /day    . Alcohol use No     Allergies   Patient has no known allergies.   Review of Systems Review of Systems  Constitutional: Negative for fever.  HENT: Negative for sore throat and trouble swallowing.   Respiratory: Negative for cough, shortness of breath and wheezing.   Skin: Positive for itching and rash.     Physical Exam Updated Vital Signs BP (!) 143/74   Pulse 74   Temp 98.7 F (37.1 C) (Oral)   Resp 16   SpO2 100%   Physical Exam  Constitutional: He appears well-developed and well-nourished. No distress.  HENT:  Head: Normocephalic and atraumatic.  Mouth/Throat: Uvula is midline. No posterior oropharyngeal edema or posterior oropharyngeal erythema.  Eyes: Conjunctivae are normal.  Neck: Neck supple.  Cardiovascular: Normal rate, regular rhythm and normal heart sounds.   Pulmonary/Chest: Effort normal and breath sounds normal. No respiratory distress. He has no wheezes. He has no rales.  Abdominal: Soft.  Musculoskeletal: Normal range of motion.  Neurological: He is alert.  Skin: Skin is warm and dry. Rash noted.  Tiny, red, raised rash to the forearm, trunk, and neck.   Psychiatric: He has a normal mood and affect.  Nursing note and vitals reviewed.    ED Treatments / Results  DIAGNOSTIC STUDIES: Oxygen Saturation is 100% on RA, normal by my interpretation.    COORDINATION OF CARE: 9:30 PM- Pt advised of plan for treatment and pt agrees.  Labs (all labs ordered are listed, but only abnormal results are displayed) Labs Reviewed - No data to display  Radiology No results found.  Procedures Procedures (including critical care time)  Medications Ordered in ED Medications - No data to display   Initial Impression / Assessment and Plan / ED Course  I have reviewed the triage vital signs and the nursing notes.  Patient with nonspecific eruption. No signs of infection. Discharge with symptomatic treatment. Follow up with PCP in 2-3 days or with  dermatologist.  Stable for d/c without difficulty swallowing and without respiratory symptoms.    Final Clinical Impressions(s) / ED Diagnoses   Final diagnoses:  Rash    New Prescriptions Discharge Medication List as of 12/17/2016  9:39 PM    START taking these medications   Details  famotidine (PEPCID) 20 MG tablet Take 1 tablet (20 mg total) by mouth 2 (two) times daily., Starting Sun 12/17/2016, Print    hydrOXYzine (ATARAX/VISTARIL) 25 MG tablet Take 1 tablet (25 mg total) by mouth every 6 (six) hours., Starting Sun 12/17/2016, Print    predniSONE (DELTASONE) 10 MG tablet Take 2 tablets (20 mg total) by mouth 2 (two) times daily with a meal., Starting Sun 12/17/2016, Print      I personally performed the services described in this documentation, which was scribed in my presence. The recorded information has been reviewed and is accurate.    Debroah Baller [place], [place] 12/18/16 [id number]    Lacretia Leigh, MD 12/20/16 [phone]

## 2016-12-18 MED FILL — $novoLIN 70/30 100 UNITS/ML: (70-30) 100 | 13 days supply | Qty: 10 | Fill #1

## 2016-12-19 ENCOUNTER — Other Ambulatory Visit: Payer: Self-pay | Admitting: Internal Medicine

## 2016-12-19 DIAGNOSIS — IMO0001 Reserved for inherently not codable concepts without codable children: Secondary | ICD-10-CM

## 2016-12-19 DIAGNOSIS — Z794 Long term (current) use of insulin: Principal | ICD-10-CM

## 2016-12-19 DIAGNOSIS — E1165 Type 2 diabetes mellitus with hyperglycemia: Principal | ICD-10-CM

## 2017-01-12 MED FILL — CREON DR 12,000 UNITS CAP: 12000 | 30 days supply | Qty: 90 | Fill #0

## 2017-02-13 MED FILL — $novoLIN 70/30 100 UNITS/ML: (70-30) 100 | 23 days supply | Qty: 10 | Fill #0

## 2017-03-30 MED FILL — $novoLIN 70/30 100 UNITS/ML: (70-30) 100 | 46 days supply | Qty: 20 | Fill #1

## 2017-04-02 ENCOUNTER — Ambulatory Visit: Payer: Self-pay | Attending: Internal Medicine

## 2017-04-23 ENCOUNTER — Encounter: Payer: Self-pay | Admitting: Family Medicine

## 2017-04-23 ENCOUNTER — Ambulatory Visit: Payer: Self-pay | Attending: Family Medicine | Admitting: Family Medicine

## 2017-04-23 VITALS — BP 139/70 | HR 67 | Temp 97.9°F | Resp 18 | Ht 68.0 in | Wt 126.0 lb

## 2017-04-23 DIAGNOSIS — Z794 Long term (current) use of insulin: Secondary | ICD-10-CM | POA: Insufficient documentation

## 2017-04-23 DIAGNOSIS — IMO0001 Reserved for inherently not codable concepts without codable children: Secondary | ICD-10-CM

## 2017-04-23 DIAGNOSIS — R809 Proteinuria, unspecified: Secondary | ICD-10-CM

## 2017-04-23 DIAGNOSIS — E1165 Type 2 diabetes mellitus with hyperglycemia: Secondary | ICD-10-CM | POA: Insufficient documentation

## 2017-04-23 DIAGNOSIS — K860 Alcohol-induced chronic pancreatitis: Secondary | ICD-10-CM

## 2017-04-23 LAB — POCT UA - MICROALBUMIN
CREATININE, POC: 300 mg/dL
MICROALBUMIN (UR) POC: 150 mg/L

## 2017-04-23 LAB — GLUCOSE, POCT (MANUAL RESULT ENTRY): POC Glucose: 70 mg/dl (ref 70–99)

## 2017-04-23 LAB — POCT GLYCOSYLATED HEMOGLOBIN (HGB A1C): HEMOGLOBIN A1C: 11.6

## 2017-04-23 MED ORDER — TRUERESULT BLOOD GLUCOSE W/DEVICE KIT: PACK | 0 refills | Status: DC

## 2017-04-23 MED ORDER — TRUEPLUS LANCETS 28G MISC: 12 refills | Status: DC

## 2017-04-23 MED ORDER — INSULIN ASPART 100 UNIT/ML ~~LOC~~ SOLN: 0.0000 [IU] | Freq: Three times a day (TID) | SUBCUTANEOUS | 11 refills | Status: DC

## 2017-04-23 MED ORDER — GLUCOSE BLOOD VI STRP: ORAL_STRIP | 12 refills | Status: DC

## 2017-04-23 MED ORDER — INSULIN DETEMIR 100 UNIT/ML ~~LOC~~ SOLN: 20.0000 [IU] | Freq: Every day | SUBCUTANEOUS | 11 refills | Status: DC

## 2017-04-23 MED ORDER — LISINOPRIL 2.5 MG PO TABS: 2.5000 mg | ORAL_TABLET | Freq: Every day | ORAL | 2 refills | Status: DC

## 2017-04-23 MED ORDER — PANCRELIPASE (LIP-PROT-AMYL) 12000-38000 UNITS PO CPEP: 12000.0000 [IU] | ORAL_CAPSULE | Freq: Three times a day (TID) | ORAL | 3 refills | Status: DC

## 2017-04-23 MED FILL — !LEVEMIR 100 UNITS/ML VIAL: 100/ML | 42 days supply | Qty: 10 | Fill #0

## 2017-04-23 MED FILL — TRUEplus LANCETS 28G MISC: 30 days supply | Qty: 100 | Fill #0

## 2017-04-23 MED FILL — LISINOPRIL 2.5 MG TABLET: 2.5 | 30 days supply | Qty: 30 | Fill #0

## 2017-04-23 MED FILL — !NOVOLOG 100UNITS/ML VIAL: 100/ML | 28 days supply | Qty: 10 | Fill #0

## 2017-04-23 MED FILL — TRUE METRIX TEST STRIP: 30 days supply | Qty: 100 | Fill #0

## 2017-04-23 MED FILL — CREON DR 12,000 UNITS CAP: 12000 | 30 days supply | Qty: 90 | Fill #0

## 2017-04-23 MED FILL — !TRUE METRIX BLOOD GLUCOSE: 30 days supply | Qty: 1 | Fill #0

## 2017-04-23 NOTE — Progress Notes (Signed)
Patient is here for re establish care DM

## 2017-04-23 NOTE — Progress Notes (Signed)
Subjective:  Patient ID: Rodney Barber, male    DOB: [birth year]  Age: 51 y.o. MRN: [id number]  CC: Diabetes   HPI Rodney Barber presents for diabetes follow up.  Symptoms: none.  Patient denies foot ulcerations, nausea, paresthesia of the feet, polydipsia, polyuria, visual disturbances and vomitting.  Evaluation to date has been included: fasting blood sugar and hemoglobin A1C.  Home sugars: patient does not check sugars. Treatment to date: novolin 70/30. History of chronic pancreatitis. Symptoms have been stable.  He denies any abdominal pain. History of alcoholism. He denies any SI/HI. He declines speaking with LCSW at this time.   Outpatient Medications Prior to Visit  Medication Sig Dispense Refill  . CREON 12000 units CPEP capsule TAKE 1 CAPSULE BY MOUTH 3 TIMES DAILY WITH MEALS. 90 capsule 0  . NOVOLIN 70/30 (70-30) 100 UNIT/ML injection INJECT 22 UNITS UNDER THE SKIN EVERY MORNING AND 20 UNITS EVERY EVENING 20 mL 3  . famotidine (PEPCID) 20 MG tablet Take 1 tablet (20 mg total) by mouth 2 (two) times daily. 30 tablet 0  . hydrOXYzine (ATARAX/VISTARIL) 25 MG tablet Take 1 tablet (25 mg total) by mouth every 6 (six) hours. 12 tablet 0  . predniSONE (DELTASONE) 10 MG tablet Take 2 tablets (20 mg total) by mouth 2 (two) times daily with a meal. 16 tablet 0  . Blood Glucose Monitoring Suppl (TRUERESULT BLOOD GLUCOSE) W/DEVICE KIT uad 1 each 0  . glucose blood (TRUE METRIX BLOOD GLUCOSE TEST) test strip Use as instructed 100 each 12  . lipase/protease/amylase (CREON) 12000 units CPEP capsule Take 1 capsule (12,000 Units total) by mouth 3 (three) times daily with meals. 270 capsule 3  . TRUEPLUS LANCETS 28G MISC uad 100 each 12   No facility-administered medications prior to visit.     ROS Review of Systems  Constitutional: Negative.   Eyes: Negative.   Respiratory: Negative.   Cardiovascular: Negative.   Gastrointestinal: Negative.   Endocrine: Negative.   Skin: Negative.     Neurological: Negative.   Psychiatric/Behavioral: Positive for behavioral problems (history of alcoholism). Negative for suicidal ideas.   Objective:  BP 139/70 (BP Location: Left Arm, Patient Position: Sitting, Cuff Size: Normal)   Pulse 67   Temp 97.9 F (36.6 C) (Oral)   Resp 18   Ht 5' 8"  (1.727 m)   Wt 126 lb (57.2 kg)   SpO2 100%   BMI 19.16 kg/m   BP/Weight 04/23/2017 12/17/2016 06/08/7355  Systolic BP 701 410 301  Diastolic BP 70 74 60  Wt. (Lbs) 126 - 132.2  BMI 19.16 - 20.11     Physical Exam  Constitutional: He appears well-developed and well-nourished.  HENT:  Head: Normocephalic and atraumatic.  Right Ear: External ear normal.  Left Ear: External ear normal.  Nose: Nose normal.  Mouth/Throat: Oropharynx is clear and moist.  Eyes: Pupils are equal, round, and reactive to light. Conjunctivae are normal. No scleral icterus.  Neck: Normal range of motion. Neck supple. No JVD present.  Cardiovascular: Normal rate, regular rhythm, normal heart sounds and intact distal pulses.   Pulmonary/Chest: Effort normal and breath sounds normal.  Abdominal: Soft. Bowel sounds are normal. There is no tenderness.  Lymphadenopathy:    He has no cervical adenopathy.  Skin: Skin is warm and dry.  Psychiatric: He expresses no homicidal and no suicidal ideation. He expresses no suicidal plans and no homicidal plans.  Nursing note and vitals reviewed.  Diabetic Foot Exam - Simple   Simple Foot  Form Diabetic Foot exam was performed with the following findings:  Yes 04/23/2017 10:10 AM  Visual Inspection No deformities, no ulcerations, no other skin breakdown bilaterally:  Yes Sensation Testing Intact to touch and monofilament testing bilaterally:  Yes Pulse Check Posterior Tibialis and Dorsalis pulse intact bilaterally:  Yes Comments     Assessment & Plan:   1. Uncontrolled type 2 diabetes mellitus without complication, with long-term current use of insulin ([place]) Start  checking CBG TID. Bring log or glucometer to next office visit. Follow up with clinical pharmacist in 2 weeks. Follow up with PCP in 3 month. - Glucose (CBG) - HgB A1c - insulin detemir (LEVEMIR) 100 UNIT/ML injection; Inject 0.2 mLs (20 Units total) into the skin at bedtime.  Dispense: 10 mL; Refill: 11 - insulin aspart (NOVOLOG) 100 UNIT/ML injection; Inject 0-14 Units into the skin 3 (three) times daily with meals. Less than 70. Do not give insulin. Treat for hypoglycemia 70-139 = 0 units 140-180 = 4 units sbq 181-240 = 6 units sbq 241-300 = 8 units sbq 301-350 = 10 units sbq 351-400 = 12 units sbq Greater than 400 = 14 units; and repeat CBG check in 30 minutes  Dispense: 10 mL; Refill: 11 - TRUEPLUS LANCETS 28G MISC; uad  Dispense: 100 each; Refill: 12 - glucose blood (TRUE METRIX BLOOD GLUCOSE TEST) test strip; Use as instructed  Dispense: 100 each; Refill: 12 - Blood Glucose Monitoring Suppl (TRUERESULT BLOOD GLUCOSE) w/Device KIT; uad  Dispense: 1 each; Refill: 0 - CMP and Liver - Lipid Panel - POCT UA - Microalbumin - lisinopril (PRINIVIL,ZESTRIL) 2.5 MG tablet; Take 1 tablet (2.5 mg total) by mouth daily.  Dispense: 30 tablet; Refill: 2  2. Alcohol-induced chronic pancreatitis ([facility])  - lipase/protease/amylase (CREON) 12000 units CPEP capsule; Take 1 capsule (12,000 Units total) by mouth 3 (three) times daily with meals.  Dispense: 270 capsule; Refill: 3  3. Urine test positive for microalbuminuria - lisinopril (PRINIVIL,ZESTRIL) 2.5 MG tablet; Take 1 tablet (2.5 mg total) by mouth daily.  Dispense: 30 tablet; Refill: 2 - POCT UA - Microalbumin   Meds ordered this encounter  Medications  . lipase/protease/amylase (CREON) 12000 units CPEP capsule    Sig: Take 1 capsule (12,000 Units total) by mouth 3 (three) times daily with meals.    Dispense:  270 capsule    Refill:  3    Order Specific Question:   Supervising Provider    Answer:   Tresa Garter [id number]  .  insulin detemir (LEVEMIR) 100 UNIT/ML injection    Sig: Inject 0.2 mLs (20 Units total) into the skin at bedtime.    Dispense:  10 mL    Refill:  11    Order Specific Question:   Supervising Provider    Answer:   Tresa Garter [id number]  . insulin aspart (NOVOLOG) 100 UNIT/ML injection    Sig: Inject 0-14 Units into the skin 3 (three) times daily with meals. Less than 70. Do not give insulin. Treat for hypoglycemia 70-139 = 0 units 140-180 = 4 units sbq 181-240 = 6 units sbq 241-300 = 8 units sbq 301-350 = 10 units sbq 351-400 = 12 units sbq Greater than 400 = 14 units; and repeat CBG check in 30 minutes    Dispense:  10 mL    Refill:  11    Order Specific Question:   Supervising Provider    Answer:   Tresa Garter [id number]  . TRUEPLUS LANCETS 28G MISC  Sig: uad    Dispense:  100 each    Refill:  12    Order Specific Question:   Supervising Provider    Answer:   Tresa Garter [id number]  . glucose blood (TRUE METRIX BLOOD GLUCOSE TEST) test strip    Sig: Use as instructed    Dispense:  100 each    Refill:  12    Order Specific Question:   Supervising Provider    Answer:   Tresa Garter [[id number]]  . Blood Glucose Monitoring Suppl (TRUERESULT BLOOD GLUCOSE) w/Device KIT    Sig: uad    Dispense:  1 each    Refill:  0    Order Specific Question:   Supervising Provider    Answer:   Tresa Garter [id number]  . lisinopril (PRINIVIL,ZESTRIL) 2.5 MG tablet    Sig: Take 1 tablet (2.5 mg total) by mouth daily.    Dispense:  30 tablet    Refill:  2    Order Specific Question:   Supervising Provider    Answer:   Tresa Garter [id number]    Follow-up: Return in about 2 weeks (around 05/07/2017) for DM check with Stacy.   Alfonse Spruce FNP

## 2017-04-23 NOTE — Patient Instructions (Signed)
Check blood sugars three times a day before meals and once at bedtime. Bring blood sugar log or glucometer to office visit. Do not administer insulin without checking blood sugars first.     Type 2 Diabetes Mellitus, Self Care, Adult When you have type 2 diabetes (type 2 diabetes mellitus), you must keep your blood sugar (glucose) under control. You can do this with:  Nutrition.  Exercise.  Lifestyle changes.  Medicines or insulin, if needed.  Support from your doctors and others.  How do I manage my blood sugar?  Check your blood sugar level every day, as often as told.  Call your doctor if your blood sugar is above your goal numbers for 2 tests in a row.  Have your A1c (hemoglobin A1c) level checked at least two times a year. Have it checked more often if your doctor tells you to. Your doctor will set treatment goals for you. Generally, you should have these blood sugar levels:  Before meals (preprandial): 80-130 mg/dL (4.4-7.2 mmol/L).  After meals (postprandial): lower than 180 mg/dL (10 mmol/L).  A1c level: less than 7%.  What do I need to know about high blood sugar? High blood sugar is called hyperglycemia. Know the signs of high blood sugar. Signs may include:  Feeling: ? Thirsty. ? Hungry. ? Very tired.  Needing to pee (urinate) more than usual.  Blurry vision.  What do I need to know about low blood sugar? Low blood sugar is called hypoglycemia. This is when blood sugar is at or below 70 mg/dL (3.9 mmol/L). Symptoms may include:  Feeling: ? Hungry. ? Worried or nervous (anxious). ? Sweaty and clammy. ? Confused. ? Dizzy. ? Sleepy. ? Sick to your stomach (nauseous).  Having: ? A fast heartbeat (palpitations). ? A headache. ? A change in your vision. ? Jerky movements that you cannot control (seizure). ? Nightmares. ? Tingling or no feeling (numbness) around the mouth, lips, or tongue.  Having trouble with: ? Talking. ? Paying attention  (concentrating). ? Moving (coordination). ? Sleeping.  Shaking.  Passing out (fainting).  Getting upset easily (irritability).  Treating low blood sugar  To treat low blood sugar, eat or drink something sugary right away. If you can think clearly and swallow safely, follow the 15:15 rule:  Take 15 grams of a fast-acting carb (carbohydrate). Some fast-acting carbs are: ? 1 tube of glucose gel. ? 3 sugar tablets (glucose pills). ? 6-8 pieces of hard candy. ? 4 oz (120 mL) of fruit juice. ? 4 oz (120 mL) regular (not diet) soda.  Check your blood sugar 15 minutes after you take the carb.  If your blood sugar is still at or below 70 mg/dL (3.9 mmol/L), take 15 grams of a carb again.  If your blood sugar does not go above 70 mg/dL (3.9 mmol/L) after 3 tries, get help right away.  After your blood sugar goes back to normal, eat a meal or a snack within 1 hour.  Treating very low blood sugar If your blood sugar is at or below 54 mg/dL (3 mmol/L), you have very low blood sugar (severe hypoglycemia). This is an emergency. Do not wait to see if the symptoms will go away. Get medical help right away. Call your local emergency services (911 in the [place] Do not drive yourself to the hospital. If you have very low blood sugar and you cannot eat or drink, you may need a glucagon shot (injection). A family member or friend should learn how to  check your blood sugar and how to give you a glucagon shot. Ask your doctor if you need to have a glucagon shot kit at home. What else is important to manage my diabetes? Medicine Follow these instructions about insulin and diabetes medicines:  Take them as told by your doctor.  Adjust them as told by your doctor.  Do not run out of them.  Having diabetes can raise your risk for other long-term conditions. These include heart or kidney disease. Your doctor may prescribe medicines to help prevent problems from diabetes. Food   Make healthy food  choices. These include: ? Chicken, fish, egg whites, and beans. ? Oats, whole wheat, bulgur, brown rice, quinoa, and millet. ? Fresh fruits and vegetables. ? Low-fat dairy products. ? Nuts, avocado, olive oil, and canola oil.  Make a food plan with a specialist (dietitian).  Follow instructions from your doctor about what you cannot eat or drink.  Drink enough fluid to keep your pee (urine) clear or pale yellow.  Eat healthy snacks between healthy meals.  Keep track of carbs that you eat. Read food labels. Learn food serving sizes.  Follow your sick day plan when you cannot eat or drink normally. Make this plan with your doctor so it is ready to use. Activity  Exercise at least 3 times a week.  Do not go more than 2 days without exercising.  Talk with your doctor before you start a new exercise. Your doctor may need to adjust your insulin, medicines, or food. Lifestyle   Do not use any tobacco products. These include cigarettes, chewing tobacco, and e-cigarettes.If you need help quitting, ask your doctor.  Ask your doctor how much alcohol is safe for you.  Learn to deal with stress. If you need help with this, ask your doctor. Body care  Stay up to date with your shots (immunizations).  Have your eyes and feet checked by a doctor as often as told.  Check your skin and feet every day. Check for cuts, bruises, redness, blisters, or sores.  Brush your teeth and gums two times a day.  Floss at least one time a day.  Go to the dentist least one time every 6 months.  Stay at a healthy weight. General instructions   Take over-the-counter and prescription medicines only as told by your doctor.  Share your diabetes care plan with: ? Your work or school. ? People you live with.  Check your pee (urine) for ketones: ? When you are sick. ? As told by your doctor.  Carry a card or wear jewelry that says that you have diabetes.  Ask your doctor: ? Do I need to meet  with a diabetes educator? ? Where can I find a support group for people with diabetes?  Keep all follow-up visits as told by your doctor. This is important. Where to find more information: To learn more about diabetes, visit:  American Diabetes Association: www.diabetes.org  American Association of Diabetes Educators: www.diabeteseducator.org/patient-resources  This information is not intended to replace advice given to you by your health care provider. Make sure you discuss any questions you have with your health care provider. Document Released: 11/15/2015 Document Revised: 12/30/2015 Document Reviewed: 08/27/2015 Elsevier Interactive Patient Education  [employer].

## 2017-04-24 ENCOUNTER — Other Ambulatory Visit: Payer: Self-pay | Admitting: *Deleted

## 2017-04-24 DIAGNOSIS — K860 Alcohol-induced chronic pancreatitis: Secondary | ICD-10-CM

## 2017-04-24 LAB — CMP AND LIVER
ALT: 18 IU/L (ref 0–44)
AST: 20 IU/L (ref 0–40)
Albumin: 3.8 g/dL (ref 3.5–5.5)
Alkaline Phosphatase: 101 IU/L (ref 39–117)
BUN: 8 mg/dL (ref 6–24)
Bilirubin Total: 0.3 mg/dL (ref 0.0–1.2)
Bilirubin, Direct: 0.09 mg/dL (ref 0.00–0.40)
CALCIUM: 8.8 mg/dL (ref 8.7–10.2)
CHLORIDE: 106 mmol/L (ref 96–106)
CO2: 26 mmol/L (ref 20–29)
CREATININE: 0.92 mg/dL (ref 0.76–1.27)
GFR calc Af Amer: 110 mL/min/{1.73_m2} (ref >59)
GFR, EST NON AFRICAN AMERICAN: 95 mL/min/{1.73_m2} (ref >59)
GLUCOSE: 151 mg/dL — AB (ref 65–99)
Potassium: 3.9 mmol/L (ref 3.5–5.2)
Sodium: 145 mmol/L — ABNORMAL HIGH (ref 134–144)
Total Protein: 6.3 g/dL (ref 6.0–8.5)

## 2017-04-24 LAB — LIPID PANEL
CHOL/HDL RATIO: 2.9 ratio (ref 0.0–5.0)
Cholesterol, Total: 135 mg/dL (ref 100–199)
HDL: 47 mg/dL (ref >39)
LDL CALC: 78 mg/dL (ref 0–99)
TRIGLYCERIDES: 49 mg/dL (ref 0–149)
VLDL CHOLESTEROL CAL: 10 mg/dL (ref 5–40)

## 2017-04-24 MED ORDER — PANCRELIPASE (LIP-PROT-AMYL) 12000-38000 UNITS PO CPEP: 12000.0000 [IU] | ORAL_CAPSULE | Freq: Three times a day (TID) | ORAL | 3 refills | Status: DC

## 2017-04-24 NOTE — Telephone Encounter (Signed)
PRINTED FOR PASS PROGRAM 

## 2017-04-25 ENCOUNTER — Telehealth: Payer: Self-pay

## 2017-04-25 ENCOUNTER — Other Ambulatory Visit: Payer: Self-pay | Admitting: Family Medicine

## 2017-04-25 NOTE — Telephone Encounter (Signed)
-----   Message from Lizbeth Bark, FNP sent at 04/25/2017  1:07 PM EDT ----- Kidney function normal Liver function normal Cholesterol levels are normal.

## 2017-04-25 NOTE — Telephone Encounter (Signed)
CMA call regarding lab results   Patient  Did not answer but brother did left a message regarding to call back for his results

## 2017-04-30 ENCOUNTER — Telehealth: Payer: Self-pay

## 2017-04-30 NOTE — Telephone Encounter (Signed)
Patient return CMA call  Patient verify DOB   Patient was aware and understood  

## 2017-05-03 ENCOUNTER — Other Ambulatory Visit: Payer: Self-pay | Admitting: *Deleted

## 2017-05-03 DIAGNOSIS — K860 Alcohol-induced chronic pancreatitis: Secondary | ICD-10-CM

## 2017-05-03 MED ORDER — PANCRELIPASE (LIP-PROT-AMYL) 12000-38000 UNITS PO CPEP: 12000.0000 [IU] | ORAL_CAPSULE | Freq: Three times a day (TID) | ORAL | 3 refills | Status: DC

## 2017-05-03 NOTE — Telephone Encounter (Signed)
PRINTED FOR PASS PROGRAM 

## 2017-05-08 ENCOUNTER — Ambulatory Visit: Payer: Self-pay | Admitting: Pharmacist

## 2017-05-15 ENCOUNTER — Ambulatory Visit: Payer: Self-pay | Attending: Family Medicine | Admitting: Pharmacist

## 2017-05-15 DIAGNOSIS — Z794 Long term (current) use of insulin: Secondary | ICD-10-CM | POA: Insufficient documentation

## 2017-05-15 DIAGNOSIS — IMO0001 Reserved for inherently not codable concepts without codable children: Secondary | ICD-10-CM

## 2017-05-15 DIAGNOSIS — Z5181 Encounter for therapeutic drug level monitoring: Secondary | ICD-10-CM | POA: Insufficient documentation

## 2017-05-15 DIAGNOSIS — E1165 Type 2 diabetes mellitus with hyperglycemia: Secondary | ICD-10-CM | POA: Insufficient documentation

## 2017-05-15 LAB — GLUCOSE, POCT (MANUAL RESULT ENTRY): POC Glucose: 154 mg/dl — AB (ref 70–99)

## 2017-05-15 NOTE — Patient Instructions (Addendum)
Thanks for coming to see [place]!  Continue taking your insulin as prescribed  Come back and see me in 4 weeks - bring your meter or a log book   Hypoglycemia Hypoglycemia is when the sugar (glucose) level in the blood is too low. Symptoms of low blood sugar may include:  Feeling: ? Hungry. ? Worried or nervous (anxious). ? Sweaty and clammy. ? Confused. ? Dizzy. ? Sleepy. ? Sick to your stomach (nauseous).  Having: ? A fast heartbeat. ? A headache. ? A change in your vision. ? Jerky movements that you cannot control (seizure). ? Nightmares. ? Tingling or no feeling (numbness) around the mouth, lips, or tongue.  Having trouble with: ? Talking. ? Paying attention (concentrating). ? Moving (coordination). ? Sleeping.  Shaking.  Passing out (fainting).  Getting upset easily (irritability).  Low blood sugar can happen to people who have diabetes and people who do not have diabetes. Low blood sugar can happen quickly, and it can be an emergency. Treating Low Blood Sugar Low blood sugar is often treated by eating or drinking something sugary right away. If you can think clearly and swallow safely, follow the 15:15 rule:  Take 15 grams of a fast-acting carb (carbohydrate). Some fast-acting carbs are: ? 1 tube of glucose gel. ? 3 sugar tablets (glucose pills). ? 6-8 pieces of hard candy. ? 4 oz (120 mL) of fruit juice. ? 4 oz (120 mL) of regular (not diet) soda.  Check your blood sugar 15 minutes after you take the carb.  If your blood sugar is still at or below 70 mg/dL (3.9 mmol/L), take 15 grams of a carb again.  If your blood sugar does not go above 70 mg/dL (3.9 mmol/L) after 3 tries, get help right away.  After your blood sugar goes back to normal, eat a meal or a snack within 1 hour.  Treating Very Low Blood Sugar If your blood sugar is at or below 54 mg/dL (3 mmol/L), you have very low blood sugar (severe hypoglycemia). This is an emergency. Do not wait to see if  the symptoms will go away. Get medical help right away. Call your local emergency services (911 in the [place] Do not drive yourself to the hospital. If you have very low blood sugar and you cannot eat or drink, you may need a glucagon shot (injection). A family member or friend should learn how to check your blood sugar and how to give you a glucagon shot. Ask your doctor if you need to have a glucagon shot kit at home. Follow these instructions at home: General instructions  Avoid any diets that cause you to not eat enough food. Talk with your doctor before you start any new diet.  Take over-the-counter and prescription medicines only as told by your doctor.  Limit alcohol to no more than 1 drink per day for nonpregnant women and 2 drinks per day for men. One drink equals 12 oz of beer, 5 oz of wine, or 1 oz of hard liquor.  Keep all follow-up visits as told by your doctor. This is important. If You Have Diabetes:   Make sure you know the symptoms of low blood sugar.  Always keep a source of sugar with you, such as: ? Sugar. ? Sugar tablets. ? Glucose gel. ? Fruit juice. ? Regular soda (not diet soda). ? Milk. ? Hard candy. ? Honey.  Take your medicines as told.  Follow your exercise and meal plan. ? Eat on time. Do  not skip meals. ? Follow your sick day plan when you cannot eat or drink normally. Make this plan ahead of time with your doctor.  Check your blood sugar as often as told by your doctor. Always check before and after exercise.  Share your diabetes care plan with: ? Your work or school. ? People you live with.  Check your pee (urine) for ketones: ? When you are sick. ? As told by your doctor.  Carry a card or wear jewelry that says you have diabetes. If You Have Low Blood Sugar From Other Causes:   Check your blood sugar as often as told by your doctor.  Follow instructions from your doctor about what you cannot eat or drink. Contact a doctor if:  You  have trouble keeping your blood sugar in your target range.  You have low blood sugar often. Get help right away if:  You still have symptoms after you eat or drink something sugary.  Your blood sugar is at or below 54 mg/dL (3 mmol/L).  You have jerky movements that you cannot control.  You pass out. These symptoms may be an emergency. Do not wait to see if the symptoms will go away. Get medical help right away. Call your local emergency services (911 in the [place] Do not drive yourself to the hospital. This information is not intended to replace advice given to you by your health care provider. Make sure you discuss any questions you have with your health care provider. Document Released: 10/18/2009 Document Revised: 12/30/2015 Document Reviewed: 08/27/2015 Elsevier Interactive Patient Education  [employer].

## 2017-05-15 NOTE — Progress Notes (Signed)
    S:     Chief Complaint  Patient presents with  . Medication Management    Patient arrives in good spirits.  Presents for diabetes evaluation, education, and management at the request of Arrie Senate, FNP. Patient was referred on 04/23/17.  Patient was last seen by Primary Care Provider on 04/23/17.   Patient reports adherence with medications.  Current diabetes medications include: Levemir 20 units at bedtime, Novolog sliding scale. Patient reports taking up to 20 units of Novolog and it seems to be working well for him.  Inject 0-14 Units into the skin 3 (three) times daily with meals. Less than 70. Do not give insulin. Treat for hypoglycemia  70-139 = 0 units  140-180 = 4 units sbq  181-240 = 6 units sbq  241-300 = 8 units sbq  301-350 = 10 units sbq  351-400 = 12 units sbq  Greater than 400 = 14 units; and repeat CBG check in 30 minutes  Patient reports hypoglycemic events. He had a reading of 70 one day. He does not know if he ate less that day.  Patient reported dietary habits: Eats 2 meals/day  Patient reported exercise habits: walks   Patient denies nocturia.  Patient denies neuropathy. Patient denies visual changes. Patient reports self foot exams.   He reports that his mother is currently admitted to the hospital and she has issues with dementia which has been stressful for him.  O:  Physical Exam   ROS   Lab Results  Component Value Date   HGBA1C 11.6 04/23/2017   There were no vitals filed for this visit.   POCT glucose = 154 (post-prandial)  Per patient report: Home fasting CBG: 100s-130s 2 hour post-prandial/random CBG: 70-130s   A/P: Diabetes longstanding currently uncontrolled based on A1c of 11.6. Patient reports hypoglycemic events and is able to verbalize appropriate hypoglycemia management plan. Patient reports adherence with medication. Control is suboptimal due to dietary indiscretion and sedentary lifestyle.  POCT glucose in  office = 154. Per patient's reports home CBGs, it sounds like he is controlled currently. Continue current medications as prescribed, including following current Novolog sliding scale. Will reassess in 1 month and requested patient to bring log book and/or meter for me to see.  Next A1C anticipated December 2018.    Written patient instructions provided.  Total time in face to face counseling 15 minutes.   Follow up in Pharmacist Clinic Visit in 4 weeks.   Patient seen with Theodoro Kos, PharmD Candidate

## 2017-06-12 ENCOUNTER — Ambulatory Visit: Payer: Self-pay | Attending: Family Medicine | Admitting: Pharmacist

## 2017-06-12 ENCOUNTER — Encounter: Payer: Self-pay | Admitting: Pharmacist

## 2017-06-12 DIAGNOSIS — Z794 Long term (current) use of insulin: Secondary | ICD-10-CM

## 2017-06-12 DIAGNOSIS — E119 Type 2 diabetes mellitus without complications: Secondary | ICD-10-CM | POA: Insufficient documentation

## 2017-06-12 DIAGNOSIS — E1165 Type 2 diabetes mellitus with hyperglycemia: Secondary | ICD-10-CM

## 2017-06-12 DIAGNOSIS — IMO0001 Reserved for inherently not codable concepts without codable children: Secondary | ICD-10-CM

## 2017-06-12 LAB — GLUCOSE, POCT (MANUAL RESULT ENTRY): POC Glucose: 158 mg/dl — AB (ref 70–99)

## 2017-06-12 NOTE — Patient Instructions (Addendum)
Thanks for coming to see Rodney Barber!  No changes to your medications today.  Follow up with Rodney Barber in December for 3 month follow up Diabetes   Hypoglycemia Hypoglycemia is when the sugar (glucose) level in the blood is too low. Symptoms of low blood sugar may include:  Feeling: ? Hungry. ? Worried or nervous (anxious). ? Sweaty and clammy. ? Confused. ? Dizzy. ? Sleepy. ? Sick to your stomach (nauseous).  Having: ? A fast heartbeat. ? A headache. ? A change in your vision. ? Jerky movements that you cannot control (seizure). ? Nightmares. ? Tingling or no feeling (numbness) around the mouth, lips, or tongue.  Having trouble with: ? Talking. ? Paying attention (concentrating). ? Moving (coordination). ? Sleeping.  Shaking.  Passing out (fainting).  Getting upset easily (irritability).  Low blood sugar can happen to people who have diabetes and people who do not have diabetes. Low blood sugar can happen quickly, and it can be an emergency. Treating Low Blood Sugar Low blood sugar is often treated by eating or drinking something sugary right away. If you can think clearly and swallow safely, follow the 15:15 rule:  Take 15 grams of a fast-acting carb (carbohydrate). Some fast-acting carbs are: ? 1 tube of glucose gel. ? 3 sugar tablets (glucose pills). ? 6-8 pieces of hard candy. ? 4 oz (120 mL) of fruit juice. ? 4 oz (120 mL) of regular (not diet) soda.  Check your blood sugar 15 minutes after you take the carb.  If your blood sugar is still at or below 70 mg/dL (3.9 mmol/L), take 15 grams of a carb again.  If your blood sugar does not go above 70 mg/dL (3.9 mmol/L) after 3 tries, get help right away.  After your blood sugar goes back to normal, eat a meal or a snack within 1 hour.  Treating Very Low Blood Sugar If your blood sugar is at or below 54 mg/dL (3 mmol/L), you have very low blood sugar (severe hypoglycemia). This is an emergency. Do not wait to  see if the symptoms will go away. Get medical help right away. Call your local emergency services (911 in the [place] Do not drive yourself to the hospital. If you have very low blood sugar and you cannot eat or drink, you may need a glucagon shot (injection). A family member or friend should learn how to check your blood sugar and how to give you a glucagon shot. Ask your doctor if you need to have a glucagon shot kit at home. Follow these instructions at home: General instructions  Avoid any diets that cause you to not eat enough food. Talk with your doctor before you start any new diet.  Take over-the-counter and prescription medicines only as told by your doctor.  Limit alcohol to no more than 1 drink per day for nonpregnant women and 2 drinks per day for men. One drink equals 12 oz of beer, 5 oz of wine, or 1 oz of hard liquor.  Keep all follow-up visits as told by your doctor. This is important. If You Have Diabetes:   Make sure you know the symptoms of low blood sugar.  Always keep a source of sugar with you, such as: ? Sugar. ? Sugar tablets. ? Glucose gel. ? Fruit juice. ? Regular soda (not diet soda). ? Milk. ? Hard candy. ? Honey.  Take your medicines as told.  Follow your exercise and meal plan. ? Eat on time. Do not skip meals. ?  Follow your sick day plan when you cannot eat or drink normally. Make this plan ahead of time with your doctor.  Check your blood sugar as often as told by your doctor. Always check before and after exercise.  Share your diabetes care plan with: ? Your work or school. ? People you live with.  Check your pee (urine) for ketones: ? When you are sick. ? As told by your doctor.  Carry a card or wear jewelry that says you have diabetes. If You Have Low Blood Sugar From Other Causes:   Check your blood sugar as often as told by your doctor.  Follow instructions from your doctor about what you cannot eat or drink. Contact a doctor  if:  You have trouble keeping your blood sugar in your target range.  You have low blood sugar often. Get help right away if:  You still have symptoms after you eat or drink something sugary.  Your blood sugar is at or below 54 mg/dL (3 mmol/L).  You have jerky movements that you cannot control.  You pass out. These symptoms may be an emergency. Do not wait to see if the symptoms will go away. Get medical help right away. Call your local emergency services (911 in the [place] Do not drive yourself to the hospital. This information is not intended to replace advice given to you by your health care provider. Make sure you discuss any questions you have with your health care provider. Document Released: 10/18/2009 Document Revised: 12/30/2015 Document Reviewed: 08/27/2015 Elsevier Interactive Patient Education  [employer].

## 2017-06-12 NOTE — Progress Notes (Signed)
    S:     Chief Complaint  Patient presents with  . Medication Management    Patient arrives in good spirits.  Presents for diabetes evaluation, education, and management at the request of  Dr. Hyman Hopes. Patient was referred on 04/23/17.  Patient was last seen by Primary Care Provider on 04/23/17.   Patient reports adherence with medications.  Current diabetes medications include: Levemir 20 units at bedtime, Novolog sliding scale. Patient reports taking up to 20 units of Novolog and it seems to be working well for him.  Inject 0-14 Units into the skin 3 (three) times daily with meals. Less than 70. Do not give insulin. Treat for hypoglycemia  70-139 = 0 units  140-180 = 4 units sbq  181-240 = 6 units sbq  241-300 = 8 units sbq  301-350 = 10 units sbq  351-400 = 12 units sbq  Greater than 400 = 14 units; and repeat CBG check in 30 minutes  Patient denies hypoglycemic events  Patient reported dietary habits: Eats 2 meals/day  Patient reported exercise habits: walks   Patient denies nocturia.  Patient denies neuropathy. Patient denies visual changes. Patient reports self foot exams.   He reports that he lost his blood sugar log.  O:  Physical Exam   ROS   Lab Results  Component Value Date   HGBA1C 11.6 04/23/2017   There were no vitals filed for this visit.   POCT glucose = 154 (post-prandial)  Per patient report: Home fasting CBG: 100s-130s 2 hour post-prandial/random CBG: 70-130s   A/P: Diabetes longstanding currently uncontrolled based on A1c of 11.6 but appears to be fairly well controlled based on patient report. Patient reports hypoglycemic events and is able to verbalize appropriate hypoglycemia management plan. Patient reports adherence with medication. Control is suboptimal due to dietary indiscretion and sedentary lifestyle.  POCT glucose in office = 158. Per patient's reports home CBGs, it sounds like he is controlled currently. Continue current  medications as prescribed, including following current Novolog sliding scale. Patient to follow up with PCP in 1 month for 3 month DM follow up.  Next A1C anticipated December 2018.    Written patient instructions provided.  Total time in face to face counseling 15 minutes.   Follow up with PCP in December for 3 month follow up for diabetes.  Patient seen with Theodoro Kos, PharmD Candidate

## 2017-07-12 ENCOUNTER — Encounter: Payer: Self-pay | Admitting: Family Medicine

## 2017-07-12 ENCOUNTER — Ambulatory Visit: Payer: Self-pay | Attending: Family Medicine | Admitting: Family Medicine

## 2017-07-12 VITALS — BP 139/65 | HR 75 | Temp 97.9°F | Resp 18 | Ht 68.0 in | Wt 122.6 lb

## 2017-07-12 DIAGNOSIS — E1110 Type 2 diabetes mellitus with ketoacidosis without coma: Secondary | ICD-10-CM

## 2017-07-12 DIAGNOSIS — E1165 Type 2 diabetes mellitus with hyperglycemia: Secondary | ICD-10-CM

## 2017-07-12 DIAGNOSIS — I10 Essential (primary) hypertension: Secondary | ICD-10-CM | POA: Insufficient documentation

## 2017-07-12 DIAGNOSIS — Z794 Long term (current) use of insulin: Secondary | ICD-10-CM | POA: Insufficient documentation

## 2017-07-12 DIAGNOSIS — Z79899 Other long term (current) drug therapy: Secondary | ICD-10-CM | POA: Insufficient documentation

## 2017-07-12 DIAGNOSIS — E119 Type 2 diabetes mellitus without complications: Secondary | ICD-10-CM | POA: Insufficient documentation

## 2017-07-12 DIAGNOSIS — R809 Proteinuria, unspecified: Secondary | ICD-10-CM | POA: Insufficient documentation

## 2017-07-12 DIAGNOSIS — IMO0001 Reserved for inherently not codable concepts without codable children: Secondary | ICD-10-CM

## 2017-07-12 LAB — GLUCOSE, POCT (MANUAL RESULT ENTRY): POC Glucose: 198 mg/dl — AB (ref 70–99)

## 2017-07-12 LAB — POCT GLYCOSYLATED HEMOGLOBIN (HGB A1C): Hemoglobin A1C: 11.9

## 2017-07-12 MED ORDER — LISINOPRIL 2.5 MG PO TABS: 2.5000 mg | ORAL_TABLET | Freq: Every day | ORAL | 2 refills | Status: DC

## 2017-07-12 MED ORDER — INSULIN DETEMIR 100 UNIT/ML ~~LOC~~ SOLN: 30.0000 [IU] | Freq: Every day | SUBCUTANEOUS | 11 refills | Status: DC

## 2017-07-12 MED FILL — !LEVEMIR 100 UNITS/ML VIAL: 100/ML | 33 days supply | Qty: 10 | Fill #0

## 2017-07-12 MED FILL — LISINOPRIL 2.5 MG TABLET: 2.5 | 30 days supply | Qty: 30 | Fill #0

## 2017-07-12 NOTE — Patient Instructions (Signed)
Bring glucometer or blood sugar log to next office visit.   Blood Glucose Monitoring, Adult Monitoring your blood sugar (glucose) helps you manage your diabetes. It also helps you and your health care provider determine how well your diabetes management plan is working. Blood glucose monitoring involves checking your blood glucose as often as directed, and keeping a record (log) of your results over time. Why should I monitor my blood glucose? Checking your blood glucose regularly can:  Help you understand how food, exercise, illnesses, and medicines affect your blood glucose.  Let you know what your blood glucose is at any time. You can quickly tell if you are having low blood glucose (hypoglycemia) or high blood glucose (hyperglycemia).  Help you and your health care provider adjust your medicines as needed.  When should I check my blood glucose? Follow instructions from your health care provider about how often to check your blood glucose. This may depend on:  The type of diabetes you have.  How well-controlled your diabetes is.  Medicines you are taking.  If you have type 1 diabetes:  Check your blood glucose at least 2 times a day.  Also check your blood glucose: ? Before every insulin injection. ? Before and after exercise. ? Between meals. ? 2 hours after a meal. ? Occasionally between 2:00 a.m. and 3:00 a.m., as directed. ? Before potentially dangerous tasks, like driving or using heavy machinery. ? At bedtime.  You may need to check your blood glucose more often, up to 6-10 times a day: ? If you use an insulin pump. ? If you need multiple daily injections (MDI). ? If your diabetes is not well-controlled. ? If you are ill. ? If you have a history of severe hypoglycemia. ? If you have a history of not knowing when your blood glucose is getting low (hypoglycemia unawareness). If you have type 2 diabetes:  If you take insulin or other diabetes medicines, check your  blood glucose at least 2 times a day.  If you are on intensive insulin therapy, check your blood glucose at least 4 times a day. Occasionally, you may also need to check between 2:00 a.m. and 3:00 a.m., as directed.  Also check your blood glucose: ? Before and after exercise. ? Before potentially dangerous tasks, like driving or using heavy machinery.  You may need to check your blood glucose more often if: ? Your medicine is being adjusted. ? Your diabetes is not well-controlled. ? You are ill. What is a blood glucose log?  A blood glucose log is a record of your blood glucose readings. It helps you and your health care provider: ? Look for patterns in your blood glucose over time. ? Adjust your diabetes management plan as needed.  Every time you check your blood glucose, write down your result and notes about things that may be affecting your blood glucose, such as your diet and exercise for the day.  Most glucose meters store a record of glucose readings in the meter. Some meters allow you to download your records to a computer. How do I check my blood glucose? Follow these steps to get accurate readings of your blood glucose: Supplies needed   Blood glucose meter.  Test strips for your meter. Each meter has its own strips. You must use the strips that come with your meter.  A needle to prick your finger (lancet). Do not use lancets more than once.  A device that holds the lancet (lancing device).  A journal or log book to write down your results. Procedure  Wash your hands with soap and water.  Prick the side of your finger (not the tip) with the lancet. Use a different finger each time.  Gently rub the finger until a small drop of blood appears.  Follow instructions that come with your meter for inserting the test strip, applying blood to the strip, and using your blood glucose meter.  Write down your result and any notes. Alternative testing sites  Some meters  allow you to use areas of your body other than your finger (alternative sites) to test your blood.  If you think you may have hypoglycemia, or if you have hypoglycemia unawareness, do not use alternative sites. Use your finger instead.  Alternative sites may not be as accurate as the fingers, because blood flow is slower in these areas. This means that the result you get may be delayed, and it may be different from the result that you would get from your finger.  The most common alternative sites are: ? Forearm. ? Thigh. ? Palm of the hand. Additional tips  Always keep your supplies with you.  If you have questions or need help, all blood glucose meters have a 24-hour "hotline" number that you can call. You may also contact your health care provider.  After you use a few boxes of test strips, adjust (calibrate) your blood glucose meter by following instructions that came with your meter. This information is not intended to replace advice given to you by your health care provider. Make sure you discuss any questions you have with your health care provider. Document Released: 07/27/2003 Document Revised: 02/11/2016 Document Reviewed: 01/03/2016 Elsevier Interactive Patient Education  2017 [employer].

## 2017-07-12 NOTE — Progress Notes (Signed)
Subjective:  Patient ID: Rodney Barber, adult    DOB: January 12, 1966  Age: 51 y.o. MRN: [id number]  CC: Diabetes and Hypertension   HPI Rodney Barber presents for diabetes follow up.  Symptoms: none.  Patient denies foot ulcerations, nausea, paresthesia of the feet, polydipsia, polyuria, visual disturbances and vomitting.  Evaluation to date has been included: fasting blood sugar and hemoglobin A1C.  Home sugars: he is inconsistent with checking blood sugars. He reports readings 100's-200. He does not bring glucometer or blood glucose log to office visit with him. Treatment to date: novolog and levemir . History of chronic pancreatitis. Symptoms have been stable.  He denies any abdominal pain. History of alcoholism. He denies any SI/HI. He declines speaking with LCSW or resource information at this time.   Outpatient Medications Prior to Visit  Medication Sig Dispense Refill  . Blood Glucose Monitoring Suppl (TRUERESULT BLOOD GLUCOSE) w/Device KIT uad 1 each 0  . famotidine (PEPCID) 20 MG tablet Take 1 tablet (20 mg total) by mouth 2 (two) times daily. 30 tablet 0  . glucose blood (TRUE METRIX BLOOD GLUCOSE TEST) test strip Use as instructed 100 each 12  . hydrOXYzine (ATARAX/VISTARIL) 25 MG tablet Take 1 tablet (25 mg total) by mouth every 6 (six) hours. 12 tablet 0  . insulin aspart (NOVOLOG) 100 UNIT/ML injection Inject 0-14 Units into the skin 3 (three) times daily with meals. Less than 70. Do not give insulin. Treat for hypoglycemia 70-139 = 0 units 140-180 = 4 units sbq 181-240 = 6 units sbq 241-300 = 8 units sbq 301-350 = 10 units sbq 351-400 = 12 units sbq Greater than 400 = 14 units; and repeat CBG check in 30 minutes 10 mL 11  . lipase/protease/amylase (CREON) 12000 units CPEP capsule Take 1 capsule (12,000 Units total) by mouth 3 (three) times daily with meals. 270 capsule 3  . lisinopril (PRINIVIL,ZESTRIL) 2.5 MG tablet Take 1 tablet (2.5 mg total) by mouth daily. 30 tablet  2  . predniSONE (DELTASONE) 10 MG tablet Take 2 tablets (20 mg total) by mouth 2 (two) times daily with a meal. 16 tablet 0  . TRUEPLUS LANCETS 28G MISC uad 100 each 12  . insulin detemir (LEVEMIR) 100 UNIT/ML injection Inject 0.2 mLs (20 Units total) into the skin at bedtime. 10 mL 11  . Glucose Blood (TRUE METRIX BLOOD GLUCOSE TEST VI) 1 each by Does not apply route once.  0  . glucose blood test strip 100 each by Does not apply route once.  12  . LEVEMIR 100 UNIT/ML injection Inject 20 Units into the skin daily.  11  . NOVOLOG 100 UNIT/ML injection Inject 10 mLs into the skin 3 (three) times daily with meals.  11   No facility-administered medications prior to visit.     ROS Review of Systems  Constitutional: Negative.   Eyes: Negative.   Respiratory: Negative.   Cardiovascular: Negative.   Gastrointestinal: Negative.   Endocrine: Negative.   Skin: Negative.   Neurological: Negative for dizziness and numbness.  Psychiatric/Behavioral: Positive for behavioral problems (history of alcoholism). Negative for suicidal ideas.   Objective:  BP 139/65 (BP Location: Left Arm, Patient Position: Sitting, Cuff Size: Normal)   Pulse 75   Temp 97.9 F (36.6 C) (Oral)   Resp 18   Ht 5' 8" (1.727 m)   Wt 122 lb 9.6 oz (55.6 kg)   SpO2 100%   BMI 18.64 kg/m   BP/Weight 07/12/2017 04/23/2017 03/31/538  Systolic BP  735 670 141  Diastolic BP 65 70 74  Wt. (Lbs) 122.6 126 -  BMI 18.64 19.16 -     Physical Exam  Constitutional: He appears well-developed and well-nourished.  HENT:  Head: Normocephalic and atraumatic.  Right Ear: External ear normal.  Left Ear: External ear normal.  Nose: Nose normal.  Mouth/Throat: Oropharynx is clear and moist.  Eyes: Conjunctivae are normal. Pupils are equal, round, and reactive to light. No scleral icterus.  Neck: Normal range of motion. Neck supple. No JVD present.  Cardiovascular: Normal rate, regular rhythm, normal heart sounds and intact distal  pulses.  Pulmonary/Chest: Effort normal and breath sounds normal.  Abdominal: Soft. Bowel sounds are normal. There is no tenderness.  Skin: Skin is warm and dry.  Psychiatric: He expresses no homicidal and no suicidal ideation. He expresses no suicidal plans and no homicidal plans.  Nursing note and vitals reviewed.    Assessment & Plan:   1. Uncontrolled type 2 diabetes mellitus without complication, with long-term current use of insulin (HCC) Increase Levemir dose. Bring glucometer and/or glucose log to next office visit. Follow up with clinical pharmacist in 2 weeks. Follow up with PCP in 3 months. - Glucose (CBG) - HgB A1c - Glucose Blood (TRUE METRIX BLOOD GLUCOSE TEST VI); 1 each by Does not apply route once.; Refill: 0 - glucose blood test strip; 100 each by Does not apply route once.; Refill: 12 - Ambulatory referral to Ophthalmology - insulin detemir (LEVEMIR) 100 UNIT/ML injection; Inject 0.3 mLs (30 Units total) into the skin at bedtime.  Dispense: 10 mL; Refill: 11 - lisinopril (PRINIVIL,ZESTRIL) 2.5 MG tablet; Take 1 tablet (2.5 mg total) by mouth daily.  Dispense: 30 tablet; Refill: 2   Follow-up: Return in about 2 weeks (around 07/26/2017) for DM with Stacy.   Alfonse Spruce FNP

## 2017-07-25 ENCOUNTER — Ambulatory Visit: Payer: Self-pay | Attending: Family Medicine | Admitting: Pharmacist

## 2017-07-25 ENCOUNTER — Encounter: Payer: Self-pay | Admitting: Pharmacist

## 2017-07-25 DIAGNOSIS — Z794 Long term (current) use of insulin: Secondary | ICD-10-CM | POA: Insufficient documentation

## 2017-07-25 DIAGNOSIS — IMO0001 Reserved for inherently not codable concepts without codable children: Secondary | ICD-10-CM

## 2017-07-25 DIAGNOSIS — Z8719 Personal history of other diseases of the digestive system: Secondary | ICD-10-CM | POA: Insufficient documentation

## 2017-07-25 DIAGNOSIS — E1165 Type 2 diabetes mellitus with hyperglycemia: Secondary | ICD-10-CM

## 2017-07-25 DIAGNOSIS — E119 Type 2 diabetes mellitus without complications: Secondary | ICD-10-CM | POA: Insufficient documentation

## 2017-07-25 LAB — GLUCOSE, POCT (MANUAL RESULT ENTRY): POC Glucose: 221 mg/dl — AB (ref 70–99)

## 2017-07-25 NOTE — Patient Instructions (Addendum)
Thank you for coming to see me  Try to cut back on sweets over the next several weeks. Avoid added sugar in coffee and sodas and juices.  Come back and see me in 4 weeks   Tips for Eating Away From Home If You Have Diabetes Controlling your level of blood glucose, also known as blood sugar, can be challenging. It can be even more difficult when you do not prepare your own meals. The following tips can help you manage your diabetes when you eat away from home. Planning ahead Plan ahead if you know you will be eating away from home:  Ask your health care provider how to time meals and medicine if you are taking insulin.  Make a list of restaurants near you that offer healthy choices. If they have a carry-out menu, take it home and plan what you will order ahead of time.  Look up the restaurant you want to eat at online. Many chain and fast-food restaurants list nutritional information online. Use this information to choose the healthiest options and to calculate how many carbohydrates will be in your meal.  Use a carbohydrate-counting book or mobile app to look up the carbohydrate content and serving size of the foods you want to eat.  Become familiar with serving sizes and learn to recognize how many servings are in a portion. This will allow you to estimate how many carbohydrates you can eat.  Free foods A "free food" is any food or drink that has less than 5 g of carbohydrates per serving. Free foods include:  Many vegetables.  Hard boiled eggs.  Nuts or seeds.  Olives.  Cheeses.  Meats.  These types of foods make good appetizer choices and are often available at salad bars. Lemon juice, vinegar, or a low-calorie salad dressing of fewer than 20 calories per serving can be used as a "free" salad dressing. Choices to reduce carbohydrates  Substitute nonfat sweetened yogurt with a sugar-free yogurt. Yogurt made from soy milk may also be used, but you will still want a sugar-free  or plain option to choose a lower carbohydrate amount.  Ask your server to take away the bread basket or chips from your table.  Order fresh fruit. A salad bar often offers fresh fruit choices. Avoid canned fruit because it is usually packed in sugar or syrup.  Order a salad, and eat it without dressing. Or, create a "free" salad dressing.  Ask for substitutions. For example, instead of [place] fries, request an order of a vegetable such as salad, green beans, or broccoli. Other tips  If you take insulin, take the insulin once your food arrives to your table. This will ensure your insulin and food are timed correctly.  Ask your server about the portion size before your order, and ask for a take-out box if the portion has more servings than you should have. When your food comes, leave the amount you should have on the plate, and put the rest in the take-out box.  Consider splitting an entree with someone and ordering a side salad. This information is not intended to replace advice given to you by your health care provider. Make sure you discuss any questions you have with your health care provider. Document Released: 07/24/2005 Document Revised: 12/30/2015 Document Reviewed: 10/21/2013 Elsevier Interactive Patient Education  2018 [employer].  Diabetes Mellitus and Nutrition When you have diabetes (diabetes mellitus), it is very important to have healthy eating habits because your blood sugar (glucose) levels  are greatly affected by what you eat and drink. Eating healthy foods in the appropriate amounts, at about the same times every day, can help you:  Control your blood glucose.  Lower your risk of heart disease.  Improve your blood pressure.  Reach or maintain a healthy weight.  Every person with diabetes is different, and each person has different needs for a meal plan. Your health care provider may recommend that you work with a diet and nutrition specialist (dietitian) to make a  meal plan that is best for you. Your meal plan may vary depending on factors such as:  The calories you need.  The medicines you take.  Your weight.  Your blood glucose, blood pressure, and cholesterol levels.  Your activity level.  Other health conditions you have, such as heart or kidney disease.  How do carbohydrates affect me? Carbohydrates affect your blood glucose level more than any other type of food. Eating carbohydrates naturally increases the amount of glucose in your blood. Carbohydrate counting is a method for keeping track of how many carbohydrates you eat. Counting carbohydrates is important to keep your blood glucose at a healthy level, especially if you use insulin or take certain oral diabetes medicines. It is important to know how many carbohydrates you can safely have in each meal. This is different for every person. Your dietitian can help you calculate how many carbohydrates you should have at each meal and for snack. Foods that contain carbohydrates include:  Bread, cereal, rice, pasta, and crackers.  Potatoes and corn.  Peas, beans, and lentils.  Milk and yogurt.  Fruit and juice.  Desserts, such as cakes, cookies, ice cream, and candy.  How does alcohol affect me? Alcohol can cause a sudden decrease in blood glucose (hypoglycemia), especially if you use insulin or take certain oral diabetes medicines. Hypoglycemia can be a life-threatening condition. Symptoms of hypoglycemia (sleepiness, dizziness, and confusion) are similar to symptoms of having too much alcohol. If your health care provider says that alcohol is safe for you, follow these guidelines:  Limit alcohol intake to no more than 1 drink per day for nonpregnant women and 2 drinks per day for men. One drink equals 12 oz of beer, 5 oz of wine, or 1 oz of hard liquor.  Do not drink on an empty stomach.  Keep yourself hydrated with water, diet soda, or unsweetened iced tea.  Keep in mind that  regular soda, juice, and other mixers may contain a lot of sugar and must be counted as carbohydrates.  What are tips for following this plan? Reading food labels  Start by checking the serving size on the label. The amount of calories, carbohydrates, fats, and other nutrients listed on the label are based on one serving of the food. Many foods contain more than one serving per package.  Check the total grams (g) of carbohydrates in one serving. You can calculate the number of servings of carbohydrates in one serving by dividing the total carbohydrates by 15. For example, if a food has 30 g of total carbohydrates, it would be equal to 2 servings of carbohydrates.  Check the number of grams (g) of saturated and trans fats in one serving. Choose foods that have low or no amount of these fats.  Check the number of milligrams (mg) of sodium in one serving. Most people should limit total sodium intake to less than 2,300 mg per day.  Always check the nutrition information of foods labeled as "low-fat" or "  nonfat". These foods may be higher in added sugar or refined carbohydrates and should be avoided.  Talk to your dietitian to identify your daily goals for nutrients listed on the label. Shopping  Avoid buying canned, premade, or processed foods. These foods tend to be high in fat, sodium, and added sugar.  Shop around the outside edge of the grocery store. This includes fresh fruits and vegetables, bulk grains, fresh meats, and fresh dairy. Cooking  Use low-heat cooking methods, such as baking, instead of high-heat cooking methods like deep frying.  Cook using healthy oils, such as olive, canola, or sunflower oil.  Avoid cooking with butter, cream, or high-fat meats. Meal planning  Eat meals and snacks regularly, preferably at the same times every day. Avoid going long periods of time without eating.  Eat foods high in fiber, such as fresh fruits, vegetables, beans, and whole grains. Talk  to your dietitian about how many servings of carbohydrates you can eat at each meal.  Eat 4-6 ounces of lean protein each day, such as lean meat, chicken, fish, eggs, or tofu. 1 ounce is equal to 1 ounce of meat, chicken, or fish, 1 egg, or 1/4 cup of tofu.  Eat some foods each day that contain healthy fats, such as avocado, nuts, seeds, and fish. Lifestyle   Check your blood glucose regularly.  Exercise at least 30 minutes 5 or more days each week, or as told by your health care provider.  Take medicines as told by your health care provider.  Do not use any products that contain nicotine or tobacco, such as cigarettes and e-cigarettes. If you need help quitting, ask your health care provider.  Work with a Veterinary surgeoncounselor or diabetes educator to identify strategies to manage stress and any emotional and social challenges. What are some questions to ask my health care provider?  Do I need to meet with a diabetes educator?  Do I need to meet with a dietitian?  What number can I call if I have questions?  When are the best times to check my blood glucose? Where to find more information:  American Diabetes Association: diabetes.org/food-and-fitness/food  Academy of Nutrition and Dietetics: https://www.vargas.com/www.eatright.org/resources/health/diseases-and-conditions/diabetes  [employer] of Diabetes and Digestive and Kidney Diseases (NIH): FindJewelers.czwww.niddk.nih.gov/health-information/diabetes/overview/diet-eating-physical-activity Summary  A healthy meal plan will help you control your blood glucose and maintain a healthy lifestyle.  Working with a diet and nutrition specialist (dietitian) can help you make a meal plan that is best for you.  Keep in mind that carbohydrates and alcohol have immediate effects on your blood glucose levels. It is important to count carbohydrates and to use alcohol carefully. This information is not intended to replace advice given to you by your health care provider. Make sure  you discuss any questions you have with your health care provider. Document Released: 04/20/2005 Document Revised: 08/28/2016 Document Reviewed: 08/28/2016 Elsevier Interactive Patient Education  2018 [employer]  Diabetes Mellitus and Exercise Exercising regularly is important for your overall health, especially when you have diabetes (diabetes mellitus). Exercising is not only about losing weight. It has many health benefits, such as increasing muscle strength and bone density and reducing body fat and stress. This leads to improved fitness, flexibility, and endurance, all of which result in better overall health. Exercise has additional benefits for people with diabetes, including:  Reducing appetite.  Helping to lower and control blood glucose.  Lowering blood pressure.  Helping to control amounts of fatty substances (lipids) in the blood, such as cholesterol  and triglycerides.  Helping the body to respond better to insulin (improving insulin sensitivity).  Reducing how much insulin the body needs.  Decreasing the risk for heart disease by: ? Lowering cholesterol and triglyceride levels. ? Increasing the levels of good cholesterol. ? Lowering blood glucose levels.  What is my activity plan? Your health care provider or certified diabetes educator can help you make a plan for the type and frequency of exercise (activity plan) that works for you. Make sure that you:  Do at least 150 minutes of moderate-intensity or vigorous-intensity exercise each week. This could be brisk walking, biking, or water aerobics. ? Do stretching and strength exercises, such as yoga or weightlifting, at least 2 times a week. ? Spread out your activity over at least 3 days of the week.  Get some form of physical activity every day. ? Do not go more than 2 days in a row without some kind of physical activity. ? Avoid being inactive for more than 90 minutes at a time. Take frequent breaks to walk or  stretch.  Choose a type of exercise or activity that you enjoy, and set realistic goals.  Start slowly, and gradually increase the intensity of your exercise over time.  What do I need to know about managing my diabetes?  Check your blood glucose before and after exercising. ? If your blood glucose is higher than 240 mg/dL (16.1 mmol/L) before you exercise, check your urine for ketones. If you have ketones in your urine, do not exercise until your blood glucose returns to normal.  Know the symptoms of low blood glucose (hypoglycemia) and how to treat it. Your risk for hypoglycemia increases during and after exercise. Common symptoms of hypoglycemia can include: ? Hunger. ? Anxiety. ? Sweating and feeling clammy. ? Confusion. ? Dizziness or feeling light-headed. ? Increased heart rate or palpitations. ? Blurry vision. ? Tingling or numbness around the mouth, lips, or tongue. ? Tremors or shakes. ? Irritability.  Keep a rapid-acting carbohydrate snack available before, during, and after exercise to help prevent or treat hypoglycemia.  Avoid injecting insulin into areas of the body that are going to be exercised. For example, avoid injecting insulin into: ? The arms, when playing tennis. ? The legs, when jogging.  Keep records of your exercise habits. Doing this can help you and your health care provider adjust your diabetes management plan as needed. Write down: ? Food that you eat before and after you exercise. ? Blood glucose levels before and after you exercise. ? The type and amount of exercise you have done. ? When your insulin is expected to peak, if you use insulin. Avoid exercising at times when your insulin is peaking.  When you start a new exercise or activity, work with your health care provider to make sure the activity is safe for you, and to adjust your insulin, medicines, or food intake as needed.  Drink plenty of water while you exercise to prevent dehydration or  heat stroke. Drink enough fluid to keep your urine clear or pale yellow. This information is not intended to replace advice given to you by your health care provider. Make sure you discuss any questions you have with your health care provider. Document Released: 10/14/2003 Document Revised: 02/11/2016 Document Reviewed: 01/03/2016 Elsevier Interactive Patient Education  2018 [employer].

## 2017-07-25 NOTE — Progress Notes (Signed)
    S:     Chief Complaint  Patient presents with  . Medication Management    Patient arrives in good spirits.  Presents for diabetes evaluation, education, and management at the request of  Dr. Hyman Hopes. Patient was referred on 04/23/17.  Patient was last seen by Primary Care Provider on 04/23/17.   Patient reports adherence with medications.  Current diabetes medications include: Levemir 30 units at bedtime (only taking 25 units daily), Novolog sliding scale. Patient reports consistently taking 20 units of Novolog twice daily.   Inject 0-14 Units into the skin 3 (three) times daily with meals. Less than 70. Do not give insulin. Treat for hypoglycemia  70-139 = 0 units  140-180 = 4 units sbq  181-240 = 6 units sbq  241-300 = 8 units sbq  301-350 = 10 units sbq  351-400 = 12 units sbq  Greater than 400 = 14 units; and repeat CBG check in 30 minutes  Patient denies hypoglycemic events but did say Levemir 30 units made him feel woozy. He did not take any blood sugars during this time.  Patient reported dietary habits: Eats 2 meals/day. Reports that he has an issue eating too many Little Debbie cakes.   Patient reported exercise habits: walks   Patient denies nocturia.  Patient denies neuropathy. Patient denies visual changes. Patient reports self foot exams.   Checks his blood sugar at home but did not bring a log with him.   O:  Physical Exam   ROS   Lab Results  Component Value Date   HGBA1C 11.9 07/12/2017   There were no vitals filed for this visit.   POCT glucose = 221 (post-prandial)  Per patient report: Home fasting CBG: 110s-140s 2 hour post-prandial/random CBG: 140s-200   A/P: Diabetes longstanding currently uncontrolled based on A1c of 11.9 and his reported home readings do not match this A1c. Patient reports subjective hypoglycemic events and is able to verbalize appropriate hypoglycemia management plan. Patient reports adherence with medication. Control  is suboptimal due to dietary indiscretion and sedentary lifestyle.  Discussed with patient how his A1c and reported home CBGs do not match. He has to be having higher blood sugars at some point during the day. Discussed splitting the Levemir dose or increasing it by just 2 units to 27 units but patient requested that he be given time to work on diet and that he did not want to change the insulin dose. Could consider oral agent at next visit. Would avoid GLP-1 due to history of pancreatitis. Provided information on dietary changes.  Next A1C anticipated March 2019.    Written patient instructions provided.  Total time in face to face counseling 15 minutes.   Follow up with me in 1 month.

## 2017-08-22 ENCOUNTER — Ambulatory Visit: Payer: Self-pay | Attending: Family Medicine | Admitting: Family Medicine

## 2017-08-22 ENCOUNTER — Encounter: Payer: Self-pay | Admitting: Family Medicine

## 2017-08-22 VITALS — BP 112/68 | HR 82

## 2017-08-22 DIAGNOSIS — K861 Other chronic pancreatitis: Secondary | ICD-10-CM | POA: Insufficient documentation

## 2017-08-22 DIAGNOSIS — IMO0001 Reserved for inherently not codable concepts without codable children: Secondary | ICD-10-CM

## 2017-08-22 DIAGNOSIS — E1165 Type 2 diabetes mellitus with hyperglycemia: Secondary | ICD-10-CM | POA: Insufficient documentation

## 2017-08-22 DIAGNOSIS — Z79899 Other long term (current) drug therapy: Secondary | ICD-10-CM | POA: Insufficient documentation

## 2017-08-22 DIAGNOSIS — Z794 Long term (current) use of insulin: Secondary | ICD-10-CM | POA: Insufficient documentation

## 2017-08-22 LAB — GLUCOSE, POCT (MANUAL RESULT ENTRY): POC Glucose: 418 mg/dl — AB (ref 70–99)

## 2017-08-22 MED ORDER — INSULIN DETEMIR 100 UNIT/ML ~~LOC~~ SOLN: 20.0000 [IU] | Freq: Two times a day (BID) | SUBCUTANEOUS | 6 refills | Status: DC

## 2017-08-22 NOTE — Progress Notes (Signed)
Patient with hyperglycemia in clinic, needing further evaluation by a provider. Will be transferred to Dr. Baxter Flattery [facility]) schedule.

## 2017-08-22 NOTE — Patient Instructions (Signed)
Diabetes Mellitus and Nutrition When you have diabetes (diabetes mellitus), it is very important to have healthy eating habits because your blood sugar (glucose) levels are greatly affected by what you eat and drink. Eating healthy foods in the appropriate amounts, at about the same times every day, can help you:  Control your blood glucose.  Lower your risk of heart disease.  Improve your blood pressure.  Reach or maintain a healthy weight.  Every person with diabetes is different, and each person has different needs for a meal plan. Your health care provider may recommend that you work with a diet and nutrition specialist (dietitian) to make a meal plan that is best for you. Your meal plan may vary depending on factors such as:  The calories you need.  The medicines you take.  Your weight.  Your blood glucose, blood pressure, and cholesterol levels.  Your activity level.  Other health conditions you have, such as heart or kidney disease.  How do carbohydrates affect me? Carbohydrates affect your blood glucose level more than any other type of food. Eating carbohydrates naturally increases the amount of glucose in your blood. Carbohydrate counting is a method for keeping track of how many carbohydrates you eat. Counting carbohydrates is important to keep your blood glucose at a healthy level, especially if you use insulin or take certain oral diabetes medicines. It is important to know how many carbohydrates you can safely have in each meal. This is different for every person. Your dietitian can help you calculate how many carbohydrates you should have at each meal and for snack. Foods that contain carbohydrates include:  Bread, cereal, rice, pasta, and crackers.  Potatoes and corn.  Peas, beans, and lentils.  Milk and yogurt.  Fruit and juice.  Desserts, such as cakes, cookies, ice cream, and candy.  How does alcohol affect me? Alcohol can cause a sudden decrease in blood  glucose (hypoglycemia), especially if you use insulin or take certain oral diabetes medicines. Hypoglycemia can be a life-threatening condition. Symptoms of hypoglycemia (sleepiness, dizziness, and confusion) are similar to symptoms of having too much alcohol. If your health care provider says that alcohol is safe for you, follow these guidelines:  Limit alcohol intake to no more than 1 drink per day for nonpregnant women and 2 drinks per day for men. One drink equals 12 oz of beer, 5 oz of wine, or 1 oz of hard liquor.  Do not drink on an empty stomach.  Keep yourself hydrated with water, diet soda, or unsweetened iced tea.  Keep in mind that regular soda, juice, and other mixers may contain a lot of sugar and must be counted as carbohydrates.  What are tips for following this plan? Reading food labels  Start by checking the serving size on the label. The amount of calories, carbohydrates, fats, and other nutrients listed on the label are based on one serving of the food. Many foods contain more than one serving per package.  Check the total grams (g) of carbohydrates in one serving. You can calculate the number of servings of carbohydrates in one serving by dividing the total carbohydrates by 15. For example, if a food has 30 g of total carbohydrates, it would be equal to 2 servings of carbohydrates.  Check the number of grams (g) of saturated and trans fats in one serving. Choose foods that have low or no amount of these fats.  Check the number of milligrams (mg) of sodium in one serving. Most people   should limit total sodium intake to less than 2,300 mg per day.  Always check the nutrition information of foods labeled as "low-fat" or "nonfat". These foods may be higher in added sugar or refined carbohydrates and should be avoided.  Talk to your dietitian to identify your daily goals for nutrients listed on the label. Shopping  Avoid buying canned, premade, or processed foods. These  foods tend to be high in fat, sodium, and added sugar.  Shop around the outside edge of the grocery store. This includes fresh fruits and vegetables, bulk grains, fresh meats, and fresh dairy. Cooking  Use low-heat cooking methods, such as baking, instead of high-heat cooking methods like deep frying.  Cook using healthy oils, such as olive, canola, or sunflower oil.  Avoid cooking with butter, cream, or high-fat meats. Meal planning  Eat meals and snacks regularly, preferably at the same times every day. Avoid going long periods of time without eating.  Eat foods high in fiber, such as fresh fruits, vegetables, beans, and whole grains. Talk to your dietitian about how many servings of carbohydrates you can eat at each meal.  Eat 4-6 ounces of lean protein each day, such as lean meat, chicken, fish, eggs, or tofu. 1 ounce is equal to 1 ounce of meat, chicken, or fish, 1 egg, or 1/4 cup of tofu.  Eat some foods each day that contain healthy fats, such as avocado, nuts, seeds, and fish. Lifestyle   Check your blood glucose regularly.  Exercise at least 30 minutes 5 or more days each week, or as told by your health care provider.  Take medicines as told by your health care provider.  Do not use any products that contain nicotine or tobacco, such as cigarettes and e-cigarettes. If you need help quitting, ask your health care provider.  Work with a counselor or diabetes educator to identify strategies to manage stress and any emotional and social challenges. What are some questions to ask my health care provider?  Do I need to meet with a diabetes educator?  Do I need to meet with a dietitian?  What number can I call if I have questions?  When are the best times to check my blood glucose? Where to find more information:  American Diabetes Association: diabetes.org/food-and-fitness/food  Academy of Nutrition and Dietetics:  www.eatright.org/resources/health/diseases-and-conditions/diabetes  National Institute of Diabetes and Digestive and Kidney Diseases (NIH): www.niddk.nih.gov/health-information/diabetes/overview/diet-eating-physical-activity Summary  A healthy meal plan will help you control your blood glucose and maintain a healthy lifestyle.  Working with a diet and nutrition specialist (dietitian) can help you make a meal plan that is best for you.  Keep in mind that carbohydrates and alcohol have immediate effects on your blood glucose levels. It is important to count carbohydrates and to use alcohol carefully. This information is not intended to replace advice given to you by your health care provider. Make sure you discuss any questions you have with your health care provider. Document Released: 04/20/2005 Document Revised: 08/28/2016 Document Reviewed: 08/28/2016 Elsevier Interactive Patient Education  2018 [employer] Inc.  

## 2017-08-22 NOTE — Progress Notes (Signed)
Subjective:  Patient ID: Rodney Barber, adult    DOB: 1965-11-04  Age: 52 y.o. MRN: [id number]  CC: diabetes   HPI Jaxten Brosh is a 52 year old male with a history of type 2 diabetes mellitus (A1c 11.9 from 07/2017), chronic pancreatitis who presents today for a visit with the clinical pharmacist and was found to have an elevated CBG in the clinic of 418. He endorsed taking his 30 units of Levemir last night and 15 units of NovoLog with his breakfast this morning which was at about 7:30 to 8:00 AM He denies blurry vision, chest pains, hyperglycemia.  Past Medical History:  Diagnosis Date  . Diabetes mellitus   . Pancreatitis     Past Surgical History:  Procedure Laterality Date  . ERCP  06/27/2011   Procedure: ENDOSCOPIC RETROGRADE CHOLANGIOPANCREATOGRAPHY (ERCP);  Surgeon: Jeryl Columbia, MD;  Location: [facility];  Service: Endoscopy;  Laterality: N/A;  . ERCP W/ PLASTIC STENT PLACEMENT  03/2011    No Known Allergies    Outpatient Medications Prior to Visit  Medication Sig Dispense Refill  . Blood Glucose Monitoring Suppl (TRUERESULT BLOOD GLUCOSE) w/Device KIT uad 1 each 0  . famotidine (PEPCID) 20 MG tablet Take 1 tablet (20 mg total) by mouth 2 (two) times daily. 30 tablet 0  . Glucose Blood (TRUE METRIX BLOOD GLUCOSE TEST VI) 1 each by Does not apply route once.  0  . glucose blood (TRUE METRIX BLOOD GLUCOSE TEST) test strip Use as instructed 100 each 12  . glucose blood test strip 100 each by Does not apply route once.  12  . hydrOXYzine (ATARAX/VISTARIL) 25 MG tablet Take 1 tablet (25 mg total) by mouth every 6 (six) hours. 12 tablet 0  . insulin aspart (NOVOLOG) 100 UNIT/ML injection Inject 0-14 Units into the skin 3 (three) times daily with meals. Less than 70. Do not give insulin. Treat for hypoglycemia 70-139 = 0 units 140-180 = 4 units sbq 181-240 = 6 units sbq 241-300 = 8 units sbq 301-350 = 10 units sbq 351-400 = 12 units sbq Greater than 400 = 14  units; and repeat CBG check in 30 minutes 10 mL 11  . lipase/protease/amylase (CREON) 12000 units CPEP capsule Take 1 capsule (12,000 Units total) by mouth 3 (three) times daily with meals. 270 capsule 3  . lisinopril (PRINIVIL,ZESTRIL) 2.5 MG tablet Take 1 tablet (2.5 mg total) by mouth daily. 30 tablet 2  . predniSONE (DELTASONE) 10 MG tablet Take 2 tablets (20 mg total) by mouth 2 (two) times daily with a meal. 16 tablet 0  . TRUEPLUS LANCETS 28G MISC uad 100 each 12  . insulin detemir (LEVEMIR) 100 UNIT/ML injection Inject 0.3 mLs (30 Units total) into the skin at bedtime. 10 mL 11   No facility-administered medications prior to visit.     ROS Review of Systems  Constitutional: Negative for activity change, appetite change and fatigue.  HENT: Negative for congestion, sinus pressure and sore throat.   Eyes: Negative for visual disturbance.  Respiratory: Negative for cough, chest tightness, shortness of breath and wheezing.   Cardiovascular: Negative for chest pain and palpitations.  Gastrointestinal: Negative for abdominal distention, abdominal pain and constipation.  Endocrine: Negative for polydipsia.  Genitourinary: Negative for dysuria and frequency.  Musculoskeletal: Negative for arthralgias and back pain.  Skin: Negative for rash.  Neurological: Negative for tremors, light-headedness and numbness.  Hematological: Does not bruise/bleed easily.  Psychiatric/Behavioral: Negative for agitation and behavioral problems.    Objective:  BP 112/68   Pulse 82   BP/Weight 08/22/2017 07/12/2017 3/54/3014  Systolic BP 840 397 953  Diastolic BP 68 65 70  Wt. (Lbs) - 122.6 126  BMI - 18.64 19.16      Physical Exam  Constitutional: He is oriented to person, place, and time. He appears well-developed and well-nourished.  Cardiovascular: Normal rate, normal heart sounds and intact distal pulses.  No murmur heard. Pulmonary/Chest: Effort normal and breath sounds normal. He has no  wheezes. He has no rales. He exhibits no tenderness.  Abdominal: Soft. Bowel sounds are normal. He exhibits no distension and no mass. There is no tenderness.  Musculoskeletal: Normal range of motion.  Neurological: He is alert and oriented to person, place, and time.  Skin: Skin is warm and dry.  Psychiatric: He has a normal mood and affect.     Assessment & Plan:   1. Uncontrolled type 2 diabetes mellitus without complication, with long-term current use of insulin ([place]) Uncontrolled with A1c of 11.9 Hyperglycemia of 418 in the clinic but repeat CBG at the time of my seeing the patient had trended down to 228 Discussed with the patient increasing his Lantus dose with splitting it to 20 units twice a day. He will continue NovoLog sliding scale and hopefully should have reduced need for mealtime coverage given increasing dose He will keep his appointment with his PCP for follow-up of his diabetes mellitus Counseled on Diabetic diet, my plate method, 692 minutes of moderate intensity exercise/week Keep blood sugar logs with fasting goals of 80-120 mg/dl, random of less than 180 and in the event of sugars less than 60 mg/dl or greater than 400 mg/dl please notify the clinic ASAP. It is recommended that you undergo annual eye exams and annual foot exams. Pneumovax is recommended every 5 years before the age of 36 and once for a lifetime at or after the age of 26. - Glucose (CBG) - insulin detemir (LEVEMIR) 100 UNIT/ML injection; Inject 0.2 mLs (20 Units total) into the skin 2 (two) times daily.  Dispense: 30 mL; Refill: 6   Meds ordered this encounter  Medications  . insulin detemir (LEVEMIR) 100 UNIT/ML injection    Sig: Inject 0.2 mLs (20 Units total) into the skin 2 (two) times daily.    Dispense:  30 mL    Refill:  6    Discontinue previous dose    Follow-up: Return in about 1 month (around 09/22/2017) for follow up of Diabetes with Toy Baker (PCP).   Arnoldo Morale MD

## 2017-09-21 ENCOUNTER — Ambulatory Visit: Payer: Self-pay | Attending: Family Medicine | Admitting: Family Medicine

## 2017-09-21 ENCOUNTER — Encounter: Payer: Self-pay | Admitting: Family Medicine

## 2017-09-21 VITALS — BP 135/72 | HR 76 | Temp 97.4°F | Ht 68.0 in | Wt 127.4 lb

## 2017-09-21 DIAGNOSIS — E11649 Type 2 diabetes mellitus with hypoglycemia without coma: Secondary | ICD-10-CM | POA: Insufficient documentation

## 2017-09-21 DIAGNOSIS — Z794 Long term (current) use of insulin: Secondary | ICD-10-CM | POA: Insufficient documentation

## 2017-09-21 DIAGNOSIS — E1165 Type 2 diabetes mellitus with hyperglycemia: Secondary | ICD-10-CM | POA: Insufficient documentation

## 2017-09-21 DIAGNOSIS — Z79899 Other long term (current) drug therapy: Secondary | ICD-10-CM | POA: Insufficient documentation

## 2017-09-21 DIAGNOSIS — IMO0001 Reserved for inherently not codable concepts without codable children: Secondary | ICD-10-CM

## 2017-09-21 LAB — GLUCOSE, POCT (MANUAL RESULT ENTRY)
POC GLUCOSE: 68 mg/dL — AB (ref 70–99)
POC Glucose: 20 mg/dl — AB (ref 70–99)
POC Glucose: 73 mg/dl (ref 70–99)

## 2017-09-21 MED ORDER — GLIPIZIDE 5 MG PO TABS: 5.0000 mg | ORAL_TABLET | Freq: Two times a day (BID) | ORAL | 2 refills | Status: DC

## 2017-09-21 MED ORDER — LISINOPRIL 5 MG PO TABS
5.0000 mg | ORAL_TABLET | Freq: Every day | ORAL | 2 refills | Status: DC
Start: 2017-09-21 — End: 2018-12-17

## 2017-09-21 MED ORDER — INSTA-GLUCOSE 77.4 % PO GEL: 1.0000 | Freq: Once | ORAL | Status: DC

## 2017-09-21 MED ORDER — INSULIN DETEMIR 100 UNIT/ML ~~LOC~~ SOLN: 20.0000 [IU] | Freq: Every day | SUBCUTANEOUS | 11 refills | Status: DC

## 2017-09-21 MED FILL — LISINOPRIL 5 MG TABLET: 5 | 30 days supply | Qty: 30 | Fill #0

## 2017-09-21 MED FILL — ?GLIPIZIDE 5MG TABLET: 5 | 30 days supply | Qty: 60 | Fill #0

## 2017-09-21 MED FILL — !LEVEMIR 100 UNITS/ML VIAL: 100/ML | 28 days supply | Qty: 10 | Fill #0

## 2017-09-21 NOTE — Progress Notes (Signed)
Follow DM  1053- Given Graham crackers and orange juice  Room 11:20 CBG 73--given glucose gel-

## 2017-09-21 NOTE — Patient Instructions (Addendum)
Do NOT take NOVOLOG insulin, please discontinue.    Hypoglycemia Hypoglycemia is when the sugar (glucose) level in the blood is too low. Symptoms of low blood sugar may include:  Feeling: ? Hungry. ? Worried or nervous (anxious). ? Sweaty and clammy. ? Confused. ? Dizzy. ? Sleepy. ? Sick to your stomach (nauseous).  Having: ? A fast heartbeat. ? A headache. ? A change in your vision. ? Jerky movements that you cannot control (seizure). ? Nightmares. ? Tingling or no feeling (numbness) around the mouth, lips, or tongue.  Having trouble with: ? Talking. ? Paying attention (concentrating). ? Moving (coordination). ? Sleeping.  Shaking.  Passing out (fainting).  Getting upset easily (irritability).  Low blood sugar can happen to people who have diabetes and people who do not have diabetes. Low blood sugar can happen quickly, and it can be an emergency. Treating Low Blood Sugar Low blood sugar is often treated by eating or drinking something sugary right away. If you can think clearly and swallow safely, follow the 15:15 rule:  Take 15 grams of a fast-acting carb (carbohydrate). Some fast-acting carbs are: ? 1 tube of glucose gel. ? 3 sugar tablets (glucose pills). ? 6-8 pieces of hard candy. ? 4 oz (120 mL) of fruit juice. ? 4 oz (120 mL) of regular (not diet) soda.  Check your blood sugar 15 minutes after you take the carb.  If your blood sugar is still at or below 70 mg/dL (3.9 mmol/L), take 15 grams of a carb again.  If your blood sugar does not go above 70 mg/dL (3.9 mmol/L) after 3 tries, get help right away.  After your blood sugar goes back to normal, eat a meal or a snack within 1 hour.  Treating Very Low Blood Sugar If your blood sugar is at or below 54 mg/dL (3 mmol/L), you have very low blood sugar (severe hypoglycemia). This is an emergency. Do not wait to see if the symptoms will go away. Get medical help right away. Call your local emergency services  (911 in the [place] Do not drive yourself to the hospital. If you have very low blood sugar and you cannot eat or drink, you may need a glucagon shot (injection). A family member or friend should learn how to check your blood sugar and how to give you a glucagon shot. Ask your doctor if you need to have a glucagon shot kit at home. Follow these instructions at home: General instructions  Avoid any diets that cause you to not eat enough food. Talk with your doctor before you start any new diet.  Take over-the-counter and prescription medicines only as told by your doctor.  Limit alcohol to no more than 1 drink per day for nonpregnant women and 2 drinks per day for men. One drink equals 12 oz of beer, 5 oz of wine, or 1 oz of hard liquor.  Keep all follow-up visits as told by your doctor. This is important. If You Have Diabetes:   Make sure you know the symptoms of low blood sugar.  Always keep a source of sugar with you, such as: ? Sugar. ? Sugar tablets. ? Glucose gel. ? Fruit juice. ? Regular soda (not diet soda). ? Milk. ? Hard candy. ? Honey.  Take your medicines as told.  Follow your exercise and meal plan. ? Eat on time. Do not skip meals. ? Follow your sick day plan when you cannot eat or drink normally. Make this plan ahead of time  with your doctor.  Check your blood sugar as often as told by your doctor. Always check before and after exercise.  Share your diabetes care plan with: ? Your work or school. ? People you live with.  Check your pee (urine) for ketones: ? When you are sick. ? As told by your doctor.  Carry a card or wear jewelry that says you have diabetes. If You Have Low Blood Sugar From Other Causes:   Check your blood sugar as often as told by your doctor.  Follow instructions from your doctor about what you cannot eat or drink. Contact a doctor if:  You have trouble keeping your blood sugar in your target range.  You have low blood sugar  often. Get help right away if:  You still have symptoms after you eat or drink something sugary.  Your blood sugar is at or below 54 mg/dL (3 mmol/L).  You have jerky movements that you cannot control.  You pass out. These symptoms may be an emergency. Do not wait to see if the symptoms will go away. Get medical help right away. Call your local emergency services (911 in the [place] Do not drive yourself to the hospital. This information is not intended to replace advice given to you by your health care provider. Make sure you discuss any questions you have with your health care provider. Document Released: 10/18/2009 Document Revised: 12/30/2015 Document Reviewed: 08/27/2015 Elsevier Interactive Patient Education  [employer].

## 2017-09-21 NOTE — Progress Notes (Signed)
Subjective:  Patient ID: Rodney Barber, adult    DOB: Sep 28, 1965  Age: 52 y.o. MRN: [id number]  Chief Complaint  Patient presents with  . Diabetes    HPI Rodney Barber is a 52 y.o male who present for diabetes follow up. He was found to be very hypoglycemic in office.  He reports being in a hurry this morning and taking 20 units of NovoLog insulin instead of 20 units of Levemir.  He is alert and oriented x3 and is able to follow commands.  Recommended giving dextrose IV fluids in office to help raise blood sugar however patient refused.  Risk and benefits discussed.  Insta-Glucose, juices, and snacks were given to help elevate capillary blood glucose.  He is currently on Levemir and NovoLog SSI insulin.  He admits to nonadherence with checking CBGs regularly and prior to administration of NovoLog insulin.  He reports symptoms of hypoglycemia which include weakness, fatigue, and shakiness before in the past.  He does not bring his glucometer or his blood glucose log with him to office visit.   Past Medical History:  Diagnosis Date  . Diabetes mellitus   . Pancreatitis     Past Surgical History:  Procedure Laterality Date  . ERCP  06/27/2011   Procedure: ENDOSCOPIC RETROGRADE CHOLANGIOPANCREATOGRAPHY (ERCP);  Surgeon: Jeryl Columbia, MD;  Location: [facility];  Service: Endoscopy;  Laterality: N/A;  . ERCP W/ PLASTIC STENT PLACEMENT  03/2011    No Known Allergies    Outpatient Medications Prior to Visit  Medication Sig Dispense Refill  . lipase/protease/amylase (CREON) 12000 units CPEP capsule Take 1 capsule (12,000 Units total) by mouth 3 (three) times daily with meals. 270 capsule 3  . insulin aspart (NOVOLOG) 100 UNIT/ML injection Inject 0-14 Units into the skin 3 (three) times daily with meals. Less than 70. Do not give insulin. Treat for hypoglycemia 70-139 = 0 units 140-180 = 4 units sbq 181-240 = 6 units sbq 241-300 = 8 units sbq 301-350 = 10 units sbq 351-400 = 12  units sbq Greater than 400 = 14 units; and repeat CBG check in 30 minutes 10 mL 11  . insulin detemir (LEVEMIR) 100 UNIT/ML injection Inject 0.2 mLs (20 Units total) into the skin 2 (two) times daily. 30 mL 6  . lisinopril (PRINIVIL,ZESTRIL) 2.5 MG tablet Take 1 tablet (2.5 mg total) by mouth daily. 30 tablet 2  . Blood Glucose Monitoring Suppl (TRUERESULT BLOOD GLUCOSE) w/Device KIT uad 1 each 0  . Glucose Blood (TRUE METRIX BLOOD GLUCOSE TEST VI) 1 each by Does not apply route once.  0  . glucose blood (TRUE METRIX BLOOD GLUCOSE TEST) test strip Use as instructed 100 each 12  . glucose blood test strip 100 each by Does not apply route once.  12  . hydrOXYzine (ATARAX/VISTARIL) 25 MG tablet Take 1 tablet (25 mg total) by mouth every 6 (six) hours. (Patient not taking: Reported on 09/21/2017) 12 tablet 0  . TRUEPLUS LANCETS 28G MISC uad 100 each 12  . famotidine (PEPCID) 20 MG tablet Take 1 tablet (20 mg total) by mouth 2 (two) times daily. (Patient not taking: Reported on 09/21/2017) 30 tablet 0  . predniSONE (DELTASONE) 10 MG tablet Take 2 tablets (20 mg total) by mouth 2 (two) times daily with a meal. (Patient not taking: Reported on 09/21/2017) 16 tablet 0   No facility-administered medications prior to visit.     Review of Systems  Respiratory: Negative.   Cardiovascular: Negative.   Gastrointestinal:  Negative.   Endocrine: Negative.   Skin: Negative.   Neurological: Positive for weakness.   Objective:  BP 135/72 (BP Location: Right Arm, Cuff Size: Normal)   Pulse 76   Temp (!) 97.4 F (36.3 C) (Oral)   Ht _0  (1.727 m)   Wt 127 lb 6.4 oz (57.8 kg)   SpO2 97%   BMI 19.37 kg/m   BP/Weight 09/21/2017 08/22/2017 04/09/8181  Systolic BP 993 716 967  Diastolic BP 72 68 65  Wt. (Lbs) 127.4 - 122.6  BMI 19.37 - 18.64    Physical Exam  Nursing note and vitals reviewed. Constitutional: He appears well-developed and well-nourished.  Cardiovascular: Normal rate, normal heart sounds  and intact distal pulses.  Respiratory: Effort normal and breath sounds normal.  GI: Soft. Bowel sounds are normal.  Skin: Skin is warm and dry.  Psychiatric: He has a normal mood and affect.    Assessment & Plan:   1. Uncontrolled type 2 diabetes mellitus without complication, with long-term current use of insulin ([place]) Pt.is not compliant with checking CBG's prior to administration of SSI insulin. D/c SSI due to risk for hypoglycemia.  Glipizide added.  Lisinopril increased due to elevated BP. Recommend follow up in 1 week with clinical pharmacist. Start checking CBG TID. He was encouraged to bring his glucometer and/or glucose log to office. Follow up with PCP in 8 weeks. - Glucose (CBG) - Glucose (CBG) - insulin detemir (LEVEMIR) 100 UNIT/ML injection; Inject 0.2 mLs (20 Units total) into the skin at bedtime.  Dispense: 10 mL; Refill: 11 - lisinopril (PRINIVIL,ZESTRIL) 5 MG tablet; Take 1 tablet (5 mg total) by mouth daily.  Dispense: 30 tablet; Refill: 2 - Hemoglobin A1c - glipiZIDE (GLUCOTROL) 5 MG tablet; Take 1 tablet (5 mg total) by mouth 2 (two) times daily before a meal.  Dispense: 60 tablet; Refill: 2 - Ambulatory referral to Ophthalmology - Glucose (CBG) - Glucose (CBG)  2. Severe diabetic hypoglycemia ([place]) Patient refused IV fluids.  Given insta glucose, snacks, and juices to help raise blood glucose. Given strict instructions not to continue taking novolog insulin. - INSTA-GLUCOSE 77.4 % GEL 1 Tube      Follow-up: Return in about 1 week (around 09/28/2017) for DM / BP with Stacy.   Alfonse Spruce FNP

## 2017-09-27 ENCOUNTER — Encounter: Payer: Self-pay | Admitting: Pharmacist

## 2017-09-27 ENCOUNTER — Ambulatory Visit: Payer: Self-pay | Attending: Internal Medicine | Admitting: Pharmacist

## 2017-09-27 VITALS — BP 128/70

## 2017-09-27 DIAGNOSIS — E119 Type 2 diabetes mellitus without complications: Secondary | ICD-10-CM | POA: Insufficient documentation

## 2017-09-27 DIAGNOSIS — Z79899 Other long term (current) drug therapy: Secondary | ICD-10-CM | POA: Insufficient documentation

## 2017-09-27 DIAGNOSIS — Z5181 Encounter for therapeutic drug level monitoring: Secondary | ICD-10-CM | POA: Insufficient documentation

## 2017-09-27 DIAGNOSIS — Z794 Long term (current) use of insulin: Secondary | ICD-10-CM

## 2017-09-27 DIAGNOSIS — Z8719 Personal history of other diseases of the digestive system: Secondary | ICD-10-CM | POA: Insufficient documentation

## 2017-09-27 DIAGNOSIS — E1165 Type 2 diabetes mellitus with hyperglycemia: Secondary | ICD-10-CM

## 2017-09-27 DIAGNOSIS — I10 Essential (primary) hypertension: Secondary | ICD-10-CM | POA: Insufficient documentation

## 2017-09-27 DIAGNOSIS — IMO0001 Reserved for inherently not codable concepts without codable children: Secondary | ICD-10-CM

## 2017-09-27 LAB — GLUCOSE, POCT (MANUAL RESULT ENTRY): POC GLUCOSE: 279 mg/dL — AB (ref 70–99)

## 2017-09-27 NOTE — Patient Instructions (Addendum)
Thanks for coming to see [place]  Schedule appt ASAP for new PCP for diabetes follow up.  Bring meter to every visit   Hypoglycemia Hypoglycemia is when the sugar (glucose) level in the blood is too low. Symptoms of low blood sugar may include:  Feeling: ? Hungry. ? Worried or nervous (anxious). ? Sweaty and clammy. ? Confused. ? Dizzy. ? Sleepy. ? Sick to your stomach (nauseous).  Having: ? A fast heartbeat. ? A headache. ? A change in your vision. ? Jerky movements that you cannot control (seizure). ? Nightmares. ? Tingling or no feeling (numbness) around the mouth, lips, or tongue.  Having trouble with: ? Talking. ? Paying attention (concentrating). ? Moving (coordination). ? Sleeping.  Shaking.  Passing out (fainting).  Getting upset easily (irritability).  Low blood sugar can happen to people who have diabetes and people who do not have diabetes. Low blood sugar can happen quickly, and it can be an emergency. Treating Low Blood Sugar Low blood sugar is often treated by eating or drinking something sugary right away. If you can think clearly and swallow safely, follow the 15:15 rule:  Take 15 grams of a fast-acting carb (carbohydrate). Some fast-acting carbs are: ? 1 tube of glucose gel. ? 3 sugar tablets (glucose pills). ? 6-8 pieces of hard candy. ? 4 oz (120 mL) of fruit juice. ? 4 oz (120 mL) of regular (not diet) soda.  Check your blood sugar 15 minutes after you take the carb.  If your blood sugar is still at or below 70 mg/dL (3.9 mmol/L), take 15 grams of a carb again.  If your blood sugar does not go above 70 mg/dL (3.9 mmol/L) after 3 tries, get help right away.  After your blood sugar goes back to normal, eat a meal or a snack within 1 hour.  Treating Very Low Blood Sugar If your blood sugar is at or below 54 mg/dL (3 mmol/L), you have very low blood sugar (severe hypoglycemia). This is an emergency. Do not wait to see if the symptoms will go away.  Get medical help right away. Call your local emergency services (911 in the [place] Do not drive yourself to the hospital. If you have very low blood sugar and you cannot eat or drink, you may need a glucagon shot (injection). A family member or friend should learn how to check your blood sugar and how to give you a glucagon shot. Ask your doctor if you need to have a glucagon shot kit at home. Follow these instructions at home: General instructions  Avoid any diets that cause you to not eat enough food. Talk with your doctor before you start any new diet.  Take over-the-counter and prescription medicines only as told by your doctor.  Limit alcohol to no more than 1 drink per day for nonpregnant women and 2 drinks per day for men. One drink equals 12 oz of beer, 5 oz of wine, or 1 oz of hard liquor.  Keep all follow-up visits as told by your doctor. This is important. If You Have Diabetes:   Make sure you know the symptoms of low blood sugar.  Always keep a source of sugar with you, such as: ? Sugar. ? Sugar tablets. ? Glucose gel. ? Fruit juice. ? Regular soda (not diet soda). ? Milk. ? Hard candy. ? Honey.  Take your medicines as told.  Follow your exercise and meal plan. ? Eat on time. Do not skip meals. ? Follow your sick  day plan when you cannot eat or drink normally. Make this plan ahead of time with your doctor.  Check your blood sugar as often as told by your doctor. Always check before and after exercise.  Share your diabetes care plan with: ? Your work or school. ? People you live with.  Check your pee (urine) for ketones: ? When you are sick. ? As told by your doctor.  Carry a card or wear jewelry that says you have diabetes. If You Have Low Blood Sugar From Other Causes:   Check your blood sugar as often as told by your doctor.  Follow instructions from your doctor about what you cannot eat or drink. Contact a doctor if:  You have trouble keeping your  blood sugar in your target range.  You have low blood sugar often. Get help right away if:  You still have symptoms after you eat or drink something sugary.  Your blood sugar is at or below 54 mg/dL (3 mmol/L).  You have jerky movements that you cannot control.  You pass out. These symptoms may be an emergency. Do not wait to see if the symptoms will go away. Get medical help right away. Call your local emergency services (911 in the [place] Do not drive yourself to the hospital. This information is not intended to replace advice given to you by your health care provider. Make sure you discuss any questions you have with your health care provider. Document Released: 10/18/2009 Document Revised: 12/30/2015 Document Reviewed: 08/27/2015 Elsevier Interactive Patient Education  2018 [employer].  Diabetes Mellitus and Nutrition When you have diabetes (diabetes mellitus), it is very important to have healthy eating habits because your blood sugar (glucose) levels are greatly affected by what you eat and drink. Eating healthy foods in the appropriate amounts, at about the same times every day, can help you:  Control your blood glucose.  Lower your risk of heart disease.  Improve your blood pressure.  Reach or maintain a healthy weight.  Every person with diabetes is different, and each person has different needs for a meal plan. Your health care provider may recommend that you work with a diet and nutrition specialist (dietitian) to make a meal plan that is best for you. Your meal plan may vary depending on factors such as:  The calories you need.  The medicines you take.  Your weight.  Your blood glucose, blood pressure, and cholesterol levels.  Your activity level.  Other health conditions you have, such as heart or kidney disease.  How do carbohydrates affect me? Carbohydrates affect your blood glucose level more than any other type of food. Eating carbohydrates naturally  increases the amount of glucose in your blood. Carbohydrate counting is a method for keeping track of how many carbohydrates you eat. Counting carbohydrates is important to keep your blood glucose at a healthy level, especially if you use insulin or take certain oral diabetes medicines. It is important to know how many carbohydrates you can safely have in each meal. This is different for every person. Your dietitian can help you calculate how many carbohydrates you should have at each meal and for snack. Foods that contain carbohydrates include:  Bread, cereal, rice, pasta, and crackers.  Potatoes and corn.  Peas, beans, and lentils.  Milk and yogurt.  Fruit and juice.  Desserts, such as cakes, cookies, ice cream, and candy.  How does alcohol affect me? Alcohol can cause a sudden decrease in blood glucose (hypoglycemia), especially if  you use insulin or take certain oral diabetes medicines. Hypoglycemia can be a life-threatening condition. Symptoms of hypoglycemia (sleepiness, dizziness, and confusion) are similar to symptoms of having too much alcohol. If your health care provider says that alcohol is safe for you, follow these guidelines:  Limit alcohol intake to no more than 1 drink per day for nonpregnant women and 2 drinks per day for men. One drink equals 12 oz of beer, 5 oz of wine, or 1 oz of hard liquor.  Do not drink on an empty stomach.  Keep yourself hydrated with water, diet soda, or unsweetened iced tea.  Keep in mind that regular soda, juice, and other mixers may contain a lot of sugar and must be counted as carbohydrates.  What are tips for following this plan? Reading food labels  Start by checking the serving size on the label. The amount of calories, carbohydrates, fats, and other nutrients listed on the label are based on one serving of the food. Many foods contain more than one serving per package.  Check the total grams (g) of carbohydrates in one serving. You  can calculate the number of servings of carbohydrates in one serving by dividing the total carbohydrates by 15. For example, if a food has 30 g of total carbohydrates, it would be equal to 2 servings of carbohydrates.  Check the number of grams (g) of saturated and trans fats in one serving. Choose foods that have low or no amount of these fats.  Check the number of milligrams (mg) of sodium in one serving. Most people should limit total sodium intake to less than 2,300 mg per day.  Always check the nutrition information of foods labeled as "low-fat" or "nonfat". These foods may be higher in added sugar or refined carbohydrates and should be avoided.  Talk to your dietitian to identify your daily goals for nutrients listed on the label. Shopping  Avoid buying canned, premade, or processed foods. These foods tend to be high in fat, sodium, and added sugar.  Shop around the outside edge of the grocery store. This includes fresh fruits and vegetables, bulk grains, fresh meats, and fresh dairy. Cooking  Use low-heat cooking methods, such as baking, instead of high-heat cooking methods like deep frying.  Cook using healthy oils, such as olive, canola, or sunflower oil.  Avoid cooking with butter, cream, or high-fat meats. Meal planning  Eat meals and snacks regularly, preferably at the same times every day. Avoid going long periods of time without eating.  Eat foods high in fiber, such as fresh fruits, vegetables, beans, and whole grains. Talk to your dietitian about how many servings of carbohydrates you can eat at each meal.  Eat 4-6 ounces of lean protein each day, such as lean meat, chicken, fish, eggs, or tofu. 1 ounce is equal to 1 ounce of meat, chicken, or fish, 1 egg, or 1/4 cup of tofu.  Eat some foods each day that contain healthy fats, such as avocado, nuts, seeds, and fish. Lifestyle   Check your blood glucose regularly.  Exercise at least 30 minutes 5 or more days each  week, or as told by your health care provider.  Take medicines as told by your health care provider.  Do not use any products that contain nicotine or tobacco, such as cigarettes and e-cigarettes. If you need help quitting, ask your health care provider.  Work with a Social worker or diabetes educator to identify strategies to manage stress and any emotional and social  challenges. What are some questions to ask my health care provider?  Do I need to meet with a diabetes educator?  Do I need to meet with a dietitian?  What number can I call if I have questions?  When are the best times to check my blood glucose? Where to find more information:  American Diabetes Association: diabetes.org/food-and-fitness/food  Academy of Nutrition and Dietetics: PokerClues.dk  [employer] of Diabetes and Digestive and Kidney Diseases (NIH): ContactWire.be Summary  A healthy meal plan will help you control your blood glucose and maintain a healthy lifestyle.  Working with a diet and nutrition specialist (dietitian) can help you make a meal plan that is best for you.  Keep in mind that carbohydrates and alcohol have immediate effects on your blood glucose levels. It is important to count carbohydrates and to use alcohol carefully. This information is not intended to replace advice given to you by your health care provider. Make sure you discuss any questions you have with your health care provider. Document Released: 04/20/2005 Document Revised: 08/28/2016 Document Reviewed: 08/28/2016 Elsevier Interactive Patient Education  [employer].

## 2017-09-27 NOTE — Progress Notes (Signed)
    S:     Chief Complaint  Patient presents with  . Medication Management    Patient arrives in good spirits.  Presents for diabetes evaluation, education, and management.  Patient reports adherence with medications.  Current diabetes medications include: Levemir 20 units daily, glipizide 5 mg BID. Novolog stopped at last visit due to noncompliance with CBG monitoring and episodes of hypoglycemia. Current hypertension medications include: lisinopril 5 mg daily   Patient denies hypoglycemic events.  Patient does not have his meter with him today but reports fastings of 270s and post-prandials of 100s-200s.    O:  Physical Exam   ROS   Lab Results  Component Value Date   HGBA1C 11.9 07/12/2017   Vitals:   09/27/17 1021  BP: 128/70     POCT glucose = 279 post-prandial   A/P: Diabetes longstanding currently uncontrolled based on A1c of 11.9. I am not sure that his report of his home readings is accurate so I would like to see the meter before any adjustments due to risk of hypoglycemia. Patient denies hypoglycemic events and is able to verbalize appropriate hypoglycemia management plan. Patient reports adherence with medication. Control is suboptimal due to dietary indiscretion and poor health literacy.  Continue current medications for now. Encouraged patient to continue to monitor blood sugar at home and to report to [place] any readings <70. He is not a candidate for GLP-1 or DPP-IV since he has a history of pancreatitis. Once we can see his meter, his medications can be adjusted as needed then.   Next A1C anticipated March 2019.    Hypertension longstanding currently controlled.  Patient reports adherence with medication. Continue current medications.  Written patient instructions provided.  Total time in face to face counseling 15 minutes.   Follow up in Pharmacist Clinic Visit PRN, next visit to establish care with PCP.   Patient seen with Enid Derry, PharmD  Candidate.

## 2017-10-19 ENCOUNTER — Encounter: Payer: Self-pay | Admitting: Nurse Practitioner

## 2017-10-19 ENCOUNTER — Ambulatory Visit: Payer: Self-pay | Attending: Nurse Practitioner | Admitting: Nurse Practitioner

## 2017-10-19 VITALS — BP 118/74 | HR 77 | Temp 98.3°F | Resp 12 | Ht 68.0 in | Wt 122.2 lb

## 2017-10-19 DIAGNOSIS — Z79899 Other long term (current) drug therapy: Secondary | ICD-10-CM | POA: Insufficient documentation

## 2017-10-19 DIAGNOSIS — E1110 Type 2 diabetes mellitus with ketoacidosis without coma: Secondary | ICD-10-CM

## 2017-10-19 DIAGNOSIS — Z794 Long term (current) use of insulin: Secondary | ICD-10-CM | POA: Insufficient documentation

## 2017-10-19 DIAGNOSIS — K860 Alcohol-induced chronic pancreatitis: Secondary | ICD-10-CM

## 2017-10-19 DIAGNOSIS — Z760 Encounter for issue of repeat prescription: Secondary | ICD-10-CM | POA: Insufficient documentation

## 2017-10-19 DIAGNOSIS — E11649 Type 2 diabetes mellitus with hypoglycemia without coma: Secondary | ICD-10-CM | POA: Insufficient documentation

## 2017-10-19 DIAGNOSIS — I10 Essential (primary) hypertension: Secondary | ICD-10-CM | POA: Insufficient documentation

## 2017-10-19 LAB — POCT URINALYSIS DIPSTICK
BILIRUBIN UA: NEGATIVE
Glucose, UA: 500
Ketones, UA: NEGATIVE
LEUKOCYTES UA: NEGATIVE
NITRITE UA: NEGATIVE
Spec Grav, UA: 1.015 (ref 1.010–1.025)
UROBILINOGEN UA: 0.2 U/dL
pH, UA: 5.5 (ref 5.0–8.0)

## 2017-10-19 LAB — GLUCOSE, POCT (MANUAL RESULT ENTRY)
POC GLUCOSE: 407 mg/dL — AB (ref 70–99)
POC GLUCOSE: 438 mg/dL — AB (ref 70–99)

## 2017-10-19 LAB — POCT GLYCOSYLATED HEMOGLOBIN (HGB A1C): HEMOGLOBIN A1C: 12.9

## 2017-10-19 MED ORDER — PANCRELIPASE (LIP-PROT-AMYL) 12000-38000 UNITS PO CPEP: 12000.0000 [IU] | ORAL_CAPSULE | Freq: Three times a day (TID) | ORAL | 3 refills | Status: DC

## 2017-10-19 MED ORDER — INSULIN ASPART 100 UNIT/ML ~~LOC~~ SOLN
20.0000 [IU] | Freq: Once | SUBCUTANEOUS | Status: AC
  Administered 2017-10-19: 20 [IU] via SUBCUTANEOUS

## 2017-10-19 NOTE — Progress Notes (Signed)
Assessment & Plan:  Rodney Barber was seen today for establish care and medication refill.  Diagnoses and all orders for this visit:  DM (diabetes mellitus) type 2, uncontrolled, with ketoacidosis (HCC) -     Glucose (CBG) -     HgB A1c -     insulin aspart (novoLOG) injection 20 Units -     Urinalysis Dipstick -     CMP14+EGFR -     Glucose (CBG) Continue blood sugar control as discussed in office today, low carbohydrate diet, and regular physical exercise as tolerated, 150 minutes per week (30 min each day, 5 days per week, or 50 min 3 days per week). Keep blood sugar logs with fasting goal of 80-130 mg/dl, post prandial less than 180.  For Hypoglycemia: BS <60 and Hyperglycemia BS >400; contact the clinic ASAP. Annual eye exams and foot exams are recommended.  Alcohol-induced chronic pancreatitis (HCC) -     lipase/protease/amylase (CREON) 12000 units CPEP capsule; Take 1 capsule (12,000 Units total) by mouth 3 (three) times daily with meals.  Patient has been counseled on age-appropriate routine health concerns for screening and prevention. These are reviewed and up-to-date. Referrals have been placed accordingly. Immunizations are up-to-date or declined.    Subjective:   Chief Complaint  Patient presents with  . Establish Care    Pt's is here to establish care for diabetes.   . Medication Refill   HPI Rodney Barber 52 y.o. male presents to office today to establish care. He had an office visit for diabetic education with the pharmacist on 09-27-2017. Per Phamacist's notes "He  is not a candidate for GLP-1 or DPP-IV since he has a history of pancreatitis."  Chronic Pancreatitis Taking creon as prescribed. Denies any abdominal pain, diarrhea, nausea or vomiting.    Diabetes Mellitus Type 2 Patient checked his glucose at home around 8am and reading was 78. Today in the office his glucose is >400. He was given 20units of Novolog during his office visit today. He reports eating  sausage and french fries this morning as well as drinking a soda. He is aware that his diet is not diabetic friendly. He denies chest pain, blurred vision or headache. He checks his blood sugars twice a day. Fasting: 120s. Post prandial: 200-300s. He drinks sodas everyday but they are not diet sodas. He is overdue for his eye exam. Endorses medication compliance taking glipizide, levemir 20units at bedtime.  Lab Results  Component Value Date   HGBA1C 12.9 10/19/2017     Review of Systems  Constitutional: Negative for fever, malaise/fatigue and weight loss.  HENT: Negative.  Negative for nosebleeds.   Eyes: Negative.  Negative for blurred vision, double vision and photophobia.  Respiratory: Negative.  Negative for cough and shortness of breath.   Cardiovascular: Negative.  Negative for chest pain, palpitations and leg swelling.  Gastrointestinal: Negative.  Negative for heartburn, nausea and vomiting.  Musculoskeletal: Negative.  Negative for myalgias.  Neurological: Negative.  Negative for dizziness, focal weakness, seizures and headaches.  Psychiatric/Behavioral: Negative.  Negative for suicidal ideas.    Past Medical History:  Diagnosis Date  . Diabetes mellitus   . Pancreatitis     Past Surgical History:  Procedure Laterality Date  . ERCP  06/27/2011   Procedure: ENDOSCOPIC RETROGRADE CHOLANGIOPANCREATOGRAPHY (ERCP);  Surgeon: Jeryl Columbia, MD;  Location: [facility];  Service: Endoscopy;  Laterality: N/A;  . ERCP W/ PLASTIC STENT PLACEMENT  03/2011    Family History  Problem Relation Age of  Onset  . Diabetes Mother   . Diabetes Brother     Social History Reviewed with no changes to be made today.   Outpatient Medications Prior to Visit  Medication Sig Dispense Refill  . Blood Glucose Monitoring Suppl (TRUERESULT BLOOD GLUCOSE) w/Device KIT uad 1 each 0  . glipiZIDE (GLUCOTROL) 5 MG tablet Take 1 tablet (5 mg total) by mouth 2 (two) times daily before a meal. 60 tablet 2    . Glucose Blood (TRUE METRIX BLOOD GLUCOSE TEST VI) 1 each by Does not apply route once.  0  . glucose blood (TRUE METRIX BLOOD GLUCOSE TEST) test strip Use as instructed 100 each 12  . glucose blood test strip 100 each by Does not apply route once.  12  . insulin detemir (LEVEMIR) 100 UNIT/ML injection Inject 0.2 mLs (20 Units total) into the skin at bedtime. 10 mL 11  . lisinopril (PRINIVIL,ZESTRIL) 5 MG tablet Take 1 tablet (5 mg total) by mouth daily. 30 tablet 2  . TRUEPLUS LANCETS 28G MISC uad 100 each 12  . lipase/protease/amylase (CREON) 12000 units CPEP capsule Take 1 capsule (12,000 Units total) by mouth 3 (three) times daily with meals. 270 capsule 3  . hydrOXYzine (ATARAX/VISTARIL) 25 MG tablet Take 1 tablet (25 mg total) by mouth every 6 (six) hours. (Patient not taking: Reported on 09/21/2017) 12 tablet 0   Facility-Administered Medications Prior to Visit  Medication Dose Route Frequency Provider Last Rate Last Dose  . INSTA-GLUCOSE 77.4 % GEL 1 Tube  1 Tube Oral Once Hairston, Mandesia R, FNP        No Known Allergies     Objective:    BP 118/74 (BP Location: Left Arm, Patient Position: Sitting, Cuff Size: Normal)   Pulse 77   Temp 98.3 F (36.8 C) (Oral)   Resp 12   Ht _0  (1.727 m)   Wt 122 lb 3.2 oz (55.4 kg)   SpO2 98%   BMI 18.58 kg/m  Wt Readings from Last 3 Encounters:  10/19/17 122 lb 3.2 oz (55.4 kg)  09/21/17 127 lb 6.4 oz (57.8 kg)  07/12/17 122 lb 9.6 oz (55.6 kg)    Physical Exam  Constitutional: He is oriented to person, place, and time. He appears well-developed and well-nourished. He is cooperative.  HENT:  Head: Normocephalic and atraumatic.  Eyes: EOM are normal.  Neck: Normal range of motion.  Cardiovascular: Normal rate, regular rhythm and normal heart sounds. Exam reveals no gallop and no friction rub.  No murmur heard. Pulmonary/Chest: Effort normal and breath sounds normal. No tachypnea. No respiratory distress. He has no decreased  breath sounds. He has no wheezes. He has no rhonchi. He has no rales. He exhibits no tenderness.  Abdominal: Soft. Bowel sounds are normal.  Musculoskeletal: Normal range of motion. He exhibits no edema.  Neurological: He is alert and oriented to person, place, and time. Coordination normal.  Skin: Skin is warm and dry.  Psychiatric: He has a normal mood and affect. His behavior is normal. Judgment and thought content normal.  Nursing note and vitals reviewed.      Patient has been counseled extensively about nutrition and exercise as well as the importance of adherence with medications and regular follow-up. The patient was given clear instructions to go to ER or return to medical center if symptoms don't improve, worsen or new problems develop. The patient verbalized understanding.   Follow-up: Return in about 3 months (around 01/19/2018) for HTN/HPL/DM.   Zelda  Leanne Chang, FNP-BC [facility] [place], [place]   10/25/2017, 9:20 PM

## 2017-10-19 NOTE — Patient Instructions (Addendum)
Diabetes Mellitus and Nutrition When you have diabetes (diabetes mellitus), it is very important to have healthy eating habits because your blood sugar (glucose) levels are greatly affected by what you eat and drink. Eating healthy foods in the appropriate amounts, at about the same times every day, can help you:  Control your blood glucose.  Lower your risk of heart disease.  Improve your blood pressure.  Reach or maintain a healthy weight.  Every person with diabetes is different, and each person has different needs for a meal plan. Your health care provider may recommend that you work with a diet and nutrition specialist (dietitian) to make a meal plan that is best for you. Your meal plan may vary depending on factors such as:  The calories you need.  The medicines you take.  Your weight.  Your blood glucose, blood pressure, and cholesterol levels.  Your activity level.  Other health conditions you have, such as heart or kidney disease.  How do carbohydrates affect me? Carbohydrates affect your blood glucose level more than any other type of food. Eating carbohydrates naturally increases the amount of glucose in your blood. Carbohydrate counting is a method for keeping track of how many carbohydrates you eat. Counting carbohydrates is important to keep your blood glucose at a healthy level, especially if you use insulin or take certain oral diabetes medicines. It is important to know how many carbohydrates you can safely have in each meal. This is different for every person. Your dietitian can help you calculate how many carbohydrates you should have at each meal and for snack. Foods that contain carbohydrates include:  Bread, cereal, rice, pasta, and crackers.  Potatoes and corn.  Peas, beans, and lentils.  Milk and yogurt.  Fruit and juice.  Desserts, such as cakes, cookies, ice cream, and candy.  How does alcohol affect me? Alcohol can cause a sudden decrease in blood  glucose (hypoglycemia), especially if you use insulin or take certain oral diabetes medicines. Hypoglycemia can be a life-threatening condition. Symptoms of hypoglycemia (sleepiness, dizziness, and confusion) are similar to symptoms of having too much alcohol. If your health care provider says that alcohol is safe for you, follow these guidelines:  Limit alcohol intake to no more than 1 drink per day for nonpregnant women and 2 drinks per day for men. One drink equals 12 oz of beer, 5 oz of wine, or 1 oz of hard liquor.  Do not drink on an empty stomach.  Keep yourself hydrated with water, diet soda, or unsweetened iced tea.  Keep in mind that regular soda, juice, and other mixers may contain a lot of sugar and must be counted as carbohydrates.  What are tips for following this plan? Reading food labels  Start by checking the serving size on the label. The amount of calories, carbohydrates, fats, and other nutrients listed on the label are based on one serving of the food. Many foods contain more than one serving per package.  Check the total grams (g) of carbohydrates in one serving. You can calculate the number of servings of carbohydrates in one serving by dividing the total carbohydrates by 15. For example, if a food has 30 g of total carbohydrates, it would be equal to 2 servings of carbohydrates.  Check the number of grams (g) of saturated and trans fats in one serving. Choose foods that have low or no amount of these fats.  Check the number of milligrams (mg) of sodium in one serving. Most people   should limit total sodium intake to less than 2,300 mg per day.  Always check the nutrition information of foods labeled as "low-fat" or "nonfat". These foods may be higher in added sugar or refined carbohydrates and should be avoided.  Talk to your dietitian to identify your daily goals for nutrients listed on the label. Shopping  Avoid buying canned, premade, or processed foods. These  foods tend to be high in fat, sodium, and added sugar.  Shop around the outside edge of the grocery store. This includes fresh fruits and vegetables, bulk grains, fresh meats, and fresh dairy. Cooking  Use low-heat cooking methods, such as baking, instead of high-heat cooking methods like deep frying.  Cook using healthy oils, such as olive, canola, or sunflower oil.  Avoid cooking with butter, cream, or high-fat meats. Meal planning  Eat meals and snacks regularly, preferably at the same times every day. Avoid going long periods of time without eating.  Eat foods high in fiber, such as fresh fruits, vegetables, beans, and whole grains. Talk to your dietitian about how many servings of carbohydrates you can eat at each meal.  Eat 4-6 ounces of lean protein each day, such as lean meat, chicken, fish, eggs, or tofu. 1 ounce is equal to 1 ounce of meat, chicken, or fish, 1 egg, or 1/4 cup of tofu.  Eat some foods each day that contain healthy fats, such as avocado, nuts, seeds, and fish. Lifestyle   Check your blood glucose regularly.  Exercise at least 30 minutes 5 or more days each week, or as told by your health care provider.  Take medicines as told by your health care provider.  Do not use any products that contain nicotine or tobacco, such as cigarettes and e-cigarettes. If you need help quitting, ask your health care provider.  Work with a counselor or diabetes educator to identify strategies to manage stress and any emotional and social challenges. What are some questions to ask my health care provider?  Do I need to meet with a diabetes educator?  Do I need to meet with a dietitian?  What number can I call if I have questions?  When are the best times to check my blood glucose? Where to find more information:  American Diabetes Association: diabetes.org/food-and-fitness/food  Academy of Nutrition and Dietetics:  www.eatright.org/resources/health/diseases-and-conditions/diabetes  National Institute of Diabetes and Digestive and Kidney Diseases (NIH): www.niddk.nih.gov/health-information/diabetes/overview/diet-eating-physical-activity Summary  A healthy meal plan will help you control your blood glucose and maintain a healthy lifestyle.  Working with a diet and nutrition specialist (dietitian) can help you make a meal plan that is best for you.  Keep in mind that carbohydrates and alcohol have immediate effects on your blood glucose levels. It is important to count carbohydrates and to use alcohol carefully. This information is not intended to replace advice given to you by your health care provider. Make sure you discuss any questions you have with your health care provider. Document Released: 04/20/2005 Document Revised: 08/28/2016 Document Reviewed: 08/28/2016 Elsevier Interactive Patient Education  2018 Elsevier Inc.  

## 2017-10-20 LAB — CMP14+EGFR
A/G RATIO: 1.8 (ref 1.2–2.2)
ALT: 17 IU/L (ref 0–44)
AST: 21 IU/L (ref 0–40)
Albumin: 4.2 g/dL (ref 3.5–5.5)
Alkaline Phosphatase: 95 IU/L (ref 39–117)
BILIRUBIN TOTAL: 0.3 mg/dL (ref 0.0–1.2)
BUN/Creatinine Ratio: 9 (ref 9–20)
BUN: 10 mg/dL (ref 6–24)
CHLORIDE: 103 mmol/L (ref 96–106)
CO2: 21 mmol/L (ref 20–29)
Calcium: 9 mg/dL (ref 8.7–10.2)
Creatinine, Ser: 1.06 mg/dL (ref 0.76–1.27)
GFR calc Af Amer: 92 mL/min/{1.73_m2} (ref >59)
GFR calc non Af Amer: 80 mL/min/{1.73_m2} (ref >59)
GLOBULIN, TOTAL: 2.3 g/dL (ref 1.5–4.5)
Glucose: 358 mg/dL — ABNORMAL HIGH (ref 65–99)
POTASSIUM: 3.5 mmol/L (ref 3.5–5.2)
SODIUM: 139 mmol/L (ref 134–144)
Total Protein: 6.5 g/dL (ref 6.0–8.5)

## 2017-10-23 ENCOUNTER — Ambulatory Visit: Payer: Self-pay | Attending: Nurse Practitioner | Admitting: Pharmacist

## 2017-10-23 ENCOUNTER — Encounter: Payer: Self-pay | Admitting: Pharmacist

## 2017-10-23 DIAGNOSIS — IMO0001 Reserved for inherently not codable concepts without codable children: Secondary | ICD-10-CM

## 2017-10-23 DIAGNOSIS — I10 Essential (primary) hypertension: Secondary | ICD-10-CM | POA: Insufficient documentation

## 2017-10-23 DIAGNOSIS — E1165 Type 2 diabetes mellitus with hyperglycemia: Secondary | ICD-10-CM | POA: Insufficient documentation

## 2017-10-23 DIAGNOSIS — Z794 Long term (current) use of insulin: Secondary | ICD-10-CM | POA: Insufficient documentation

## 2017-10-23 LAB — GLUCOSE, POCT (MANUAL RESULT ENTRY): POC Glucose: 187 mg/dl — AB (ref 70–99)

## 2017-10-23 MED FILL — CREON DR 12,000 UNITS CAP: 12000 | 33 days supply | Qty: 100 | Fill #0

## 2017-10-23 NOTE — Patient Instructions (Addendum)
Thanks for coming to see [place]!  Stop drinking sodas with sugar - try [employer] Zero Sugar  Check blood sugar every morning before you eat   Hypoglycemia Hypoglycemia is when the sugar (glucose) level in the blood is too low. Symptoms of low blood sugar may include:  Feeling: ? Hungry. ? Worried or nervous (anxious). ? Sweaty and clammy. ? Confused. ? Dizzy. ? Sleepy. ? Sick to your stomach (nauseous).  Having: ? A fast heartbeat. ? A headache. ? A change in your vision. ? Jerky movements that you cannot control (seizure). ? Nightmares. ? Tingling or no feeling (numbness) around the mouth, lips, or tongue.  Having trouble with: ? Talking. ? Paying attention (concentrating). ? Moving (coordination). ? Sleeping.  Shaking.  Passing out (fainting).  Getting upset easily (irritability).  Low blood sugar can happen to people who have diabetes and people who do not have diabetes. Low blood sugar can happen quickly, and it can be an emergency. Treating Low Blood Sugar Low blood sugar is often treated by eating or drinking something sugary right away. If you can think clearly and swallow safely, follow the 15:15 rule:  Take 15 grams of a fast-acting carb (carbohydrate). Some fast-acting carbs are: ? 1 tube of glucose gel. ? 3 sugar tablets (glucose pills). ? 6-8 pieces of hard candy. ? 4 oz (120 mL) of fruit juice. ? 4 oz (120 mL) of regular (not diet) soda.  Check your blood sugar 15 minutes after you take the carb.  If your blood sugar is still at or below 70 mg/dL (3.9 mmol/L), take 15 grams of a carb again.  If your blood sugar does not go above 70 mg/dL (3.9 mmol/L) after 3 tries, get help right away.  After your blood sugar goes back to normal, eat a meal or a snack within 1 hour.  Treating Very Low Blood Sugar If your blood sugar is at or below 54 mg/dL (3 mmol/L), you have very low blood sugar (severe hypoglycemia). This is an emergency. Do not wait to see if  the symptoms will go away. Get medical help right away. Call your local emergency services (911 in the [place] Do not drive yourself to the hospital. If you have very low blood sugar and you cannot eat or drink, you may need a glucagon shot (injection). A family member or friend should learn how to check your blood sugar and how to give you a glucagon shot. Ask your doctor if you need to have a glucagon shot kit at home. Follow these instructions at home: General instructions  Avoid any diets that cause you to not eat enough food. Talk with your doctor before you start any new diet.  Take over-the-counter and prescription medicines only as told by your doctor.  Limit alcohol to no more than 1 drink per day for nonpregnant women and 2 drinks per day for men. One drink equals 12 oz of beer, 5 oz of wine, or 1 oz of hard liquor.  Keep all follow-up visits as told by your doctor. This is important. If You Have Diabetes:   Make sure you know the symptoms of low blood sugar.  Always keep a source of sugar with you, such as: ? Sugar. ? Sugar tablets. ? Glucose gel. ? Fruit juice. ? Regular soda (not diet soda). ? Milk. ? Hard candy. ? Honey.  Take your medicines as told.  Follow your exercise and meal plan. ? Eat on time. Do not skip meals. ?  Follow your sick day plan when you cannot eat or drink normally. Make this plan ahead of time with your doctor.  Check your blood sugar as often as told by your doctor. Always check before and after exercise.  Share your diabetes care plan with: ? Your work or school. ? People you live with.  Check your pee (urine) for ketones: ? When you are sick. ? As told by your doctor.  Carry a card or wear jewelry that says you have diabetes. If You Have Low Blood Sugar From Other Causes:   Check your blood sugar as often as told by your doctor.  Follow instructions from your doctor about what you cannot eat or drink. Contact a doctor if:  You  have trouble keeping your blood sugar in your target range.  You have low blood sugar often. Get help right away if:  You still have symptoms after you eat or drink something sugary.  Your blood sugar is at or below 54 mg/dL (3 mmol/L).  You have jerky movements that you cannot control.  You pass out. These symptoms may be an emergency. Do not wait to see if the symptoms will go away. Get medical help right away. Call your local emergency services (911 in the [place] Do not drive yourself to the hospital. This information is not intended to replace advice given to you by your health care provider. Make sure you discuss any questions you have with your health care provider. Document Released: 10/18/2009 Document Revised: 12/30/2015 Document Reviewed: 08/27/2015 Elsevier Interactive Patient Education  [employer].

## 2017-10-23 NOTE — Progress Notes (Signed)
    S:     No chief complaint on file.   Patient arrives in good spirits. Presents for diabetes evaluation, education, and management.  Patient reports adherence with medications.  Current diabetes medications include: Levemir 20 units daily, glipizide 5 mg BID.  Current hypertension medications include: lisinopril 5 mg daily  He has been on Novolog in the past but it was stopped due to hypoglycemia.    Patient has his meter with him today. Patient reports hypoglycemic events. He had a reading of 38 after he left his last appt here with PCP on 10/19/17. I cannot tell from that encounter if insulin was administered but I am assuming it was. He reports that he is very sensitive to Novolog. He feels that he gets low only when he has Novolog. He feels like he gets high blood sugars when he eats something he shouldn't and drinks soda.   He reports that he drinks regular soda sometimes. He eats what he is able to get (has food insecurities).  He only has a few other readings on his meter from the last month and fastings are <100 and he has one post-prandial reading of 188.    O:  Physical Exam   ROS   Lab Results  Component Value Date   HGBA1C 12.9 10/19/2017   There were no vitals filed for this visit.   POCT glucose = 187 post-prandial   A/P: Diabetes longstanding currently uncontrolled based on A1c of 12.9 (up from 11.9) which does not match his reading on his meter (though he only has a few). Checked CBG with our meter and his meter and they are similar so his meter is working appropriately.  Patient reports hypoglycemic events and is able to verbalize appropriate hypoglycemia management plan. Patient reports adherence with medication. Control is suboptimal due to dietary indiscretion and poor health literacy.  Patient with labile blood sugars and high risk for hypoglycemia. Will not change his medications. Discussed dietary changes with patient and checking his CBGs at home  more frequently. He will make an effort to drink Coke Zero and avoid regular Coke and will try to check his blood sugars every morning. I know this will be a challenge for the patient but it is difficult to change his medications without more readings due to the risk of hypo - therefore will have him follow up in 1 month to get more readings overall. Patient will call with any hypoglycemia (or call 911 with severe hypoglycemia). He may benefit from a referral to endocrinology to see if we can do continuous glucose monitoring on him.  Next A1C anticipated June 2019.    Written patient instructions provided.  Total time in face to face counseling 25 minutes.   Follow up in Pharmacist Clinic Visit in 1 month (sooner if he has hypoglycemia)

## 2017-10-25 ENCOUNTER — Encounter: Payer: Self-pay | Admitting: Nurse Practitioner

## 2017-11-22 ENCOUNTER — Ambulatory Visit: Payer: Self-pay | Attending: Family Medicine | Admitting: Pharmacist

## 2017-11-22 ENCOUNTER — Encounter: Payer: Self-pay | Admitting: Pharmacist

## 2017-11-22 DIAGNOSIS — IMO0001 Reserved for inherently not codable concepts without codable children: Secondary | ICD-10-CM

## 2017-11-22 DIAGNOSIS — I10 Essential (primary) hypertension: Secondary | ICD-10-CM | POA: Insufficient documentation

## 2017-11-22 DIAGNOSIS — E1165 Type 2 diabetes mellitus with hyperglycemia: Secondary | ICD-10-CM

## 2017-11-22 DIAGNOSIS — Z794 Long term (current) use of insulin: Secondary | ICD-10-CM | POA: Insufficient documentation

## 2017-11-22 DIAGNOSIS — E119 Type 2 diabetes mellitus without complications: Secondary | ICD-10-CM | POA: Insufficient documentation

## 2017-11-22 NOTE — Patient Instructions (Signed)
Thank you for coming to see me today. Please continue taking your medications as prescribed. We made no changes today.   If you can, please monitor you sugar levels and bring these in to your next visit.   I will see you in about a month.

## 2017-11-22 NOTE — Progress Notes (Signed)
    S:     No chief complaint on file.   Patient arrives in good spirits. Presents for diabetes evaluation, education, and management.  During last pharmacy visit on 10/23/17 patient reported hypoglycemia events, however, there weren't many glucose recordings in his meter available for verification. Because of his history of hypoglycemia and labile sugar levels, his medications were not adjusted. He was encouraged to make diet changes and record more home readings to better direct therapy changes.   Patient reports adherence with medications.  Current diabetes medications include: Levemir 20 units daily, glipizide 5 mg BID.  Current hypertension medications include: lisinopril 5 mg daily  He has been on Novolog in the past but it was stopped due to hypoglycemia.    Dietary changes:  He reports that he drinks regular soda sometimes. He eats what he is able to get (has food insecurities). Since his last visit, he reports decreasing the amount of soda he consumes.  Physical Activity: yard work and walking   Reported home readings:  FPG: 140-180s Post-prandial: 180s - Pt reports taking blood sugar about 1 hour after eating.  Of note, he did not bring his meter with him today. I am having to rely on what he reports.   Pt denies hypoglyecmic events/readings.   O:  Physical Exam   ROS   Lab Results  Component Value Date   HGBA1C 12.9 10/19/2017   There were no vitals filed for this visit.   POCT Glucose: Patient reports taking fasting glucose this morning. Resulted as 180. When I asked if I could take it again in clinic, pt refused.   A/P: Diabetes longstanding currently uncontrolled based on A1c of 12.9 (up from 11.9). Patient denies recent hypoglycemic events since seeing [place] last time and is able to verbalize appropriate hypoglycemia management plan. Patient reports adherence with medication. Control is suboptimal due to dietary indiscretion and poor health  literacy.  Patient with labile blood sugars and high risk for hypoglycemia. Will not change his medications. Discussed dietary changes with patient and checking his CBGs at home more frequently. He will make an effort to drink Coke Zero and avoid regular Coke and will try to check his blood sugars every morning. Additionally, emphasized to the pt that it is important to check as many post-prandial levels as possible. He knows that we cannot make any adjustments to his medications until we have a better understanding of his glucose levels. I know this will be a challenge for the patient but it is difficult to change his medications without more readings due to the risk of hypo - therefore will have him follow up in 1 month to get more readings overall. Patient will call with any hypoglycemia (or call 911 with severe hypoglycemia). He may benefit from a referral to endocrinology to see if we can do continuous glucose monitoring on him.  Next A1C anticipated June 2019.     Written patient instructions provided.  Total time in face to face counseling 25 minutes.   Follow up in Pharmacist Clinic Visit in 1 month (sooner if he has hypoglycemia)   Butch Penny, PharmD Clinical Pharmacist  [facility]  [phone]

## 2017-12-17 NOTE — Progress Notes (Deleted)
    S:    No chief complaint on file.  Patient arrives ***. Presents for diabetes evaluation, education, and management.  Pt last saw me on 11/22/17 for insulin titration. Patient has high risk of hypoglycemia with labile blood glucose levels. He has been tried on mealtime insulin in the past but was stopped d/t hypoglycemia.  He didn't bring in any objective readings at that appointment. It was emphasized then and has been emphasized in the past that it is necessary to have home readings for titration of insulin.   Patient *** adherence with medications.  Current diabetes medications include: Levemir 20 units daily, glipizide 5 mg BID.  Current hypertension medications include: lisinopril 5 mg daily  Dietary changes:  He reports that he drinks regular soda sometimes. He eats what he is able to get (has food insecurities). Since his last visit, he reports decreasing the amount of soda he consumes.  Physical Activity: yard work and walking   Reported home readings:  FPG: *** (previous 140-180s) Post-prandial: *** (previous 180s)   Pt denies hypoglyecmic events/readings.   O:  Physical Exam   ROS   Lab Results  Component Value Date   HGBA1C 12.9 10/19/2017   There were no vitals filed for this visit.   POCT Glucose: Patient reports taking fasting glucose this morning. Resulted as 180. When I asked if I could take it again in clinic, pt refused.   A/P: Diabetes longstanding currently uncontrolled based on A1c of 12.9 (up from 11.9). Patient denies recent hypoglycemic events since seeing [place] last time and is able to verbalize appropriate hypoglycemia management plan. Patient reports adherence with medication. Control is suboptimal due to dietary indiscretion and poor health literacy.  D/t hypoglycemia risk and labile glucose levels, concern for adding/adjusting mealtime insulin. Addition of GLP-1 RA or DPP-IV isn't recommended because of pancreatitis history.   Next A1C  anticipated June 2019.     Written patient instructions provided.  Total time in face to face counseling 25 minutes.   Follow up in Pharmacist Clinic Visit in 1 month (sooner if he has hypoglycemia)   Butch Penny, PharmD Clinical Pharmacist  [facility]  [phone]

## 2017-12-18 ENCOUNTER — Encounter: Payer: Self-pay | Admitting: Pharmacist

## 2017-12-23 NOTE — Progress Notes (Signed)
    S:    No chief complaint on file.  Patient arrives in poor spirits. Presents for diabetes evaluation, education, and management.  When I last saw patient on 11/22/17, encouraged him to take home glucose levels and bring in his meter for medication adjustments to take place. Pt was amenable to the plan.   Patient reports adherence with medications.  Current diabetes medications include:  - Levemir 20 units daily - glipizide 5 mg BID.   He has been on Novolog in the past but it was stopped due to hypoglycemia.    Dietary changes:  Not addressed   Physical Activity:  Not addressed   Pt denies hypoglyecmic events/readings.   O:  Physical Exam  ROS  Lab Results  Component Value Date   HGBA1C 12.9 10/19/2017   There were no vitals filed for this visit.   POCT Glucose: 410  Reported home readings:  Pt did not bring glucometer as instructed.   A/P: Diabetes longstanding currently uncontrolled based on A1c of 12.9 (up from 11.9). Pt's POCT level resulted as 410. Will have patient seen by Marylene Land. Patient has been moved to her schedule.     Pt seen with [facility] PharmD Candidates: Carolin Sicks, [address], Class of [employer]  Butch Penny, PharmD Clinical Pharmacist  [facility]  [phone]

## 2017-12-26 ENCOUNTER — Ambulatory Visit: Payer: Self-pay | Attending: Nurse Practitioner | Admitting: Physician Assistant

## 2017-12-26 ENCOUNTER — Encounter: Payer: Self-pay | Admitting: Physician Assistant

## 2017-12-26 VITALS — BP 150/81 | HR 66 | Temp 97.9°F | Resp 16 | Wt 116.0 lb

## 2017-12-26 DIAGNOSIS — IMO0001 Reserved for inherently not codable concepts without codable children: Secondary | ICD-10-CM

## 2017-12-26 DIAGNOSIS — E1165 Type 2 diabetes mellitus with hyperglycemia: Secondary | ICD-10-CM | POA: Insufficient documentation

## 2017-12-26 DIAGNOSIS — Z91199 Patient's noncompliance with other medical treatment and regimen due to unspecified reason: Secondary | ICD-10-CM

## 2017-12-26 DIAGNOSIS — Z9119 Patient's noncompliance with other medical treatment and regimen: Secondary | ICD-10-CM | POA: Insufficient documentation

## 2017-12-26 DIAGNOSIS — Z79899 Other long term (current) drug therapy: Secondary | ICD-10-CM | POA: Insufficient documentation

## 2017-12-26 DIAGNOSIS — Z794 Long term (current) use of insulin: Secondary | ICD-10-CM | POA: Insufficient documentation

## 2017-12-26 LAB — POCT URINALYSIS DIPSTICK
Bilirubin, UA: NEGATIVE
GLUCOSE UA: POSITIVE — AB
KETONES UA: NEGATIVE
LEUKOCYTES UA: NEGATIVE
NITRITE UA: NEGATIVE
PROTEIN UA: NEGATIVE
Urobilinogen, UA: 0.2 E.U./dL
pH, UA: 5.5 (ref 5.0–8.0)

## 2017-12-26 LAB — GLUCOSE, POCT (MANUAL RESULT ENTRY)
POC GLUCOSE: 410 mg/dL — AB (ref 70–99)
POC GLUCOSE: 259 mg/dL — AB (ref 70–99)

## 2017-12-26 MED ORDER — INSULIN DETEMIR 100 UNIT/ML ~~LOC~~ SOLN: 25.0000 [IU] | Freq: Every day | SUBCUTANEOUS | 11 refills | Status: DC

## 2017-12-26 MED ORDER — INSULIN ASPART 100 UNIT/ML ~~LOC~~ SOLN: 25.0000 [IU] | Freq: Once | SUBCUTANEOUS | Status: DC

## 2017-12-26 MED ORDER — INSULIN ASPART 100 UNIT/ML ~~LOC~~ SOLN: 15.0000 [IU] | Freq: Once | SUBCUTANEOUS | Status: DC

## 2017-12-26 NOTE — Progress Notes (Signed)
Patient ID: Rodney Barber, male   DOB: 08-01-1966, 52 y.o.   MRN: [id number]   Rodney Barber, is a 52 y.o. male  CNO:[id number]  QHU:[id number]  DOB - 06/26/1966  Subjective:  Chief Complaint and HPI: Rodney Barber is a 52 y.o. male here today being seen by Benard Halsted for diabetes management.  Patient has blood sugar of 410.  Says he took 15 units additional dose of Levemir this morning bc his blood sugar was high.  He ate a box of M&Ms last night.  He denies polydipsia/polyuria.  He feels fine.  He says he is 100% compliant on his medication regimen but admits to poor diet.  Feels fine today  ROS:   Constitutional:  No f/c, No night sweats, No unexplained weight loss. EENT:  No vision changes, No blurry vision, No hearing changes. No mouth, throat, or ear problems.  Respiratory: No cough, No SOB Cardiac: No CP, no palpitations GI:  No abd pain, No N/V/D. GU: No Urinary s/sx Musculoskeletal: No joint pain Neuro: No headache, no dizziness, no motor weakness.  Skin: No rash Endocrine:  No polydipsia. No polyuria.  Psych: Denies SI/HI  No problems updated.  ALLERGIES: No Known Allergies  PAST MEDICAL HISTORY: Past Medical History:  Diagnosis Date  . Diabetes mellitus   . Pancreatitis     MEDICATIONS AT HOME: Prior to Admission medications   Medication Sig Start Date End Date Taking? Authorizing Provider  Blood Glucose Monitoring Suppl (TRUERESULT BLOOD GLUCOSE) w/Device KIT uad 04/23/17   Hairston, Toy Baker R, FNP  glipiZIDE (GLUCOTROL) 5 MG tablet Take 1 tablet (5 mg total) by mouth 2 (two) times daily before a meal. 09/21/17   Hairston, Mandesia R, FNP  Glucose Blood (TRUE METRIX BLOOD GLUCOSE TEST VI) 1 each by Does not apply route once. 04/23/17   [provider]  glucose blood (TRUE METRIX BLOOD GLUCOSE TEST) test strip Use as instructed 04/23/17   Alfonse Spruce, FNP  hydrOXYzine (ATARAX/VISTARIL) 25 MG tablet Take 1 tablet (25 mg total) by mouth  every 6 (six) hours. Patient not taking: Reported on 09/21/2017 12/17/16   Ashley Murrain, NP  insulin detemir (LEVEMIR) 100 UNIT/ML injection Inject 0.25 mLs (25 Units total) into the skin at bedtime. 12/26/17   Argentina Donovan, PA-C  lipase/protease/amylase (CREON) 12000 units CPEP capsule Take 1 capsule (12,000 Units total) by mouth 3 (three) times daily with meals. 10/19/17   Gildardo Pounds, NP  lisinopril (PRINIVIL,ZESTRIL) 5 MG tablet Take 1 tablet (5 mg total) by mouth daily. 09/21/17   Alfonse Spruce, FNP  TRUEPLUS LANCETS 28G MISC uad 04/23/17   [name] R, FNP     Objective:  EXAM:   Vitals:   12/26/17 0944  BP: (!) 150/81  Pulse: 66  Resp: 16  Temp: 97.9 F (36.6 C)  TempSrc: Oral  SpO2: 100%  Weight: 116 lb (52.6 kg)    General appearance : A&OX3. NAD. Non-toxic-appearing HEENT: Atraumatic and Normocephalic.  PERRLA. EOM intact.  Chest/Lungs:  Breathing-non-labored, Good air entry bilaterally, breath sounds normal without rales, rhonchi, or wheezing  CVS: S1 S2 regular, no murmurs, gallops, rubs  Extremities: Bilateral Lower Ext shows no edema, both legs are warm to touch with = pulse throughout Neurology:  CN II-XII grossly intact, Non focal.   Psych:  TP linear. J/I WNL. Normal speech. Appropriate eye contact and affect.  Skin:  No Rash  Data Review Lab Results  Component Value Date   HGBA1C 12.9  10/19/2017   HGBA1C 11.9 07/12/2017   HGBA1C 11.6 04/23/2017     Assessment & Plan   1. Uncontrolled type 2 diabetes mellitus without complication, with long-term current use of insulin ([place]) Unsure about compliance as he does not brin in meter or follow other instructions he is given regarding information to bring to visits with him, nor does he follow diabetic diet.   - Glucose (CBG) - POCT urinalysis dipstick - insulin aspart (novoLOG) injection 15 Units in office now and gave 16 ounces of water.   Increase dose- insulin detemir (LEVEMIR) 100  UNIT/ML injection; Inject 0.25 mLs (25 Units total) into the skin at bedtime.  Dispense: 10 mL; Refill: 11 Check blood sugars fasting and before meals and record and bring to your next visit.   2. Non-compliance Educated and discussed at length the need for proper blood sugar log, compliance with diet and medications.  Patient in office over 1.5 hours, seen by myself, Pharm D and counsled at length by both of [place].       Patient have been counseled extensively about nutrition and exercise  Return in about 3 weeks (around 01/16/2018) for DM with Philis Fendt .  The patient was given clear instructions to go to ER or return to medical center if symptoms don't improve, worsen or new problems develop. The patient verbalized understanding. The patient was told to call to get lab results if they haven't heard anything in the next week.     Freeman Caldron, PA-C [facility] [place], [place]   12/26/2017, 10:07 AM

## 2017-12-26 NOTE — Patient Instructions (Signed)
Check blood sugars fasting and before meals and record and bring to your next visit.  Your Levemir dose has been increased to 25 units at bedtime.

## 2018-01-07 MED FILL — !LEVEMIR 100 UNITS/ML VIAL: 100/ML | 40 days supply | Qty: 10 | Fill #0

## 2018-01-18 ENCOUNTER — Ambulatory Visit: Payer: Self-pay | Admitting: Nurse Practitioner

## 2018-02-05 ENCOUNTER — Ambulatory Visit: Payer: Self-pay | Admitting: Pharmacist

## 2018-02-06 ENCOUNTER — Ambulatory Visit: Payer: Self-pay | Attending: Nurse Practitioner | Admitting: Pharmacist

## 2018-02-06 ENCOUNTER — Encounter: Payer: Self-pay | Admitting: Pharmacist

## 2018-02-06 DIAGNOSIS — Z794 Long term (current) use of insulin: Secondary | ICD-10-CM | POA: Insufficient documentation

## 2018-02-06 DIAGNOSIS — IMO0001 Reserved for inherently not codable concepts without codable children: Secondary | ICD-10-CM

## 2018-02-06 DIAGNOSIS — E1165 Type 2 diabetes mellitus with hyperglycemia: Secondary | ICD-10-CM | POA: Insufficient documentation

## 2018-02-06 LAB — GLUCOSE, POCT (MANUAL RESULT ENTRY): POC GLUCOSE: 175 mg/dL — AB (ref 70–99)

## 2018-02-06 NOTE — Progress Notes (Signed)
    S:    No chief complaint on file.  Patient arrives in poor spirits. Presents for diabetes evaluation, education, and management. Is concerned with ear pain/swelling.   When I last saw him on 12/26/17, I transferred him to be seen by Marylene Land d/t POCT glucose of 410. Marylene Land had patient increase Levemir from 20 to 25 units daily. He was given RA insulin in clinic.  Patient reports adherence with medications.   Current diabetes medications include:  - Levemir 20 - 25 units daily. Patient states sometimes taking 20 units, sometimes 25 units. Claims that he checks sugar and if it's high he'll use 25 instead of 20.  - glipizide 5 mg BID. Of note, pt states taking 1 in the morning.  Dietary changes:  - Pt remains non-compliant with diabetic diet - Reports giving in to cravings. Reports intake of things like ice-cream, french fries, fast food. - Reports that he knows what to eat but has not made the necessary changes to his lifestyle  Physical Activity:  - no change  Pt endorses hypoglyecmic events/readings. Was able to view 1 objective reading of 66. Pt states that he didn't feel bad at that time.  O:   Lab Results  Component Value Date   HGBA1C 12.9 10/19/2017   There were no vitals filed for this visit.   POCT Glucose: 175. Reports eating toast and eggs before today's appointment.   Patient brings in glucometer. Sugar levels variable. Difficult to assess.  Fasting ranges: 60s - 300s Post-prandial/random levels: 200s-300s mostly; 1 outlier of 541. Pt states that this was after eating ice cream.  A/P: Diabetes longstanding currently uncontrolled based on A1c of 12.9 (up from 11.9). Pt's POCT glucose level resulted as 175. He has multiple goal fasting and prandial/random levels but still others that are well-above goal. It is difficult to manage medications at this time d/t dietary indiscretion and hypoglycemia risk. Patient would benefit from continuous glucose  monitoring/endocrinology referral.  Diet and lifestyle discussed extensively. Patient is knowledgeable of an appropriate diabetic diet but continues to make poor choices. This has been addressed at past appointments but with continued non-compliance. I emphasized that an improved diet would possibly result in more goal glycemic control. This is further evidenced by his home sugar levels. Again, multiple home levels at goal but some will jump to 200-300 range d/t intake of sweets and simple carbs.  Levemir taken inconsistently and patient doses based on glucose levels at home. Also reports taking glipizide in the morning without regards to breakfast. Emphasized that Levemir is best taken at a consistent dose. Discussed taking glipizide before meals and possibility of hypoglycemia if not done so. He reports taking insulin ~12 AM before bed. I have discussed with patient that he should start taking around 10 PM every day.   - Start Levemir 25 units daily @ 10:00 - Continue glipizide 5 mg BID - Recommend A1c/lipid panel at next appointment with Zelda on 03/06/18 - Lifestyle/Diet heavily emphasized - Regular home monitoring of blood glucose emphasized   Patient seen with Mirna Mires, PharmD Candidate  [employer] 2020  Butch Penny, PharmD, CPP Clinical Pharmacist [facility] [phone]

## 2018-02-06 NOTE — Patient Instructions (Signed)
Thank you for coming to see me today. Please do the following:  1. Take Levermir 25 units at 10:00 pm as directed today during your appointment.  2. Continue checking blood sugars at home. It's really important that you record these and bring these in to your next doctor's appointment. If you get in readings above 500 or lower than 70, call me or the clinic to let your doctor know. See below on how to treat low blood sugar.  3. Continue making the lifestyle changes we've discussed together during our visit. Diet and exercise play a significant role in improving your blood sugars.  4. Follow-up with doctor when they can schedule you. I will you see you after to make any medication changes.    Hypoglycemia or low blood sugar:   Low blood sugar can happen quickly and may become an emergency if not treated right away.   While this shouldn't happen often, it can be brought upon if you skip a meal or do not eat enough. Also, if your insulin or other diabetes medications are dosed too high, this can cause your blood sugar to go to low.   Warning signs of low blood sugar include: 1. Feeling shaky or dizzy 2. Feeling weak or tired  3. Excessive hunger 4. Feeling anxious or upset  5. Sweating even when you aren't exercising  What to do if I experience low blood sugar? 1. Check your blood sugar with your meter. If lower than 70, proceed to step 2.  2. Treat with 3-4 glucose tablets or 3 packets of regular sugar. If these aren't around, you can try hard candy. Yet another option would be to drink 4 ounces of fruit juice or 6 ounces of REGULAR soda.  3. Re-check your sugar in 15 minutes. If it is still below 70, do what you did in step 2 again. If has come back up, go ahead and eat a snack or small meal at this time.

## 2018-02-10 ENCOUNTER — Emergency Department (HOSPITAL_COMMUNITY)
Admission: EM | Admit: 2018-02-10 | Discharge: 2018-02-10 | Disposition: A | Discharge status: Home / Self Care | Payer: Self-pay | Attending: Emergency Medicine | Admitting: Emergency Medicine

## 2018-02-10 ENCOUNTER — Encounter (HOSPITAL_COMMUNITY): Payer: Self-pay | Admitting: Emergency Medicine

## 2018-02-10 DIAGNOSIS — E119 Type 2 diabetes mellitus without complications: Secondary | ICD-10-CM | POA: Insufficient documentation

## 2018-02-10 DIAGNOSIS — Y998 Other external cause status: Secondary | ICD-10-CM | POA: Insufficient documentation

## 2018-02-10 DIAGNOSIS — Z79899 Other long term (current) drug therapy: Secondary | ICD-10-CM | POA: Insufficient documentation

## 2018-02-10 DIAGNOSIS — Y29XXXA Contact with blunt object, undetermined intent, initial encounter: Secondary | ICD-10-CM | POA: Insufficient documentation

## 2018-02-10 DIAGNOSIS — Z794 Long term (current) use of insulin: Secondary | ICD-10-CM | POA: Insufficient documentation

## 2018-02-10 DIAGNOSIS — F17210 Nicotine dependence, cigarettes, uncomplicated: Secondary | ICD-10-CM | POA: Insufficient documentation

## 2018-02-10 DIAGNOSIS — Y92007 Garden or yard of unspecified non-institutional (private) residence as the place of occurrence of the external cause: Secondary | ICD-10-CM | POA: Insufficient documentation

## 2018-02-10 DIAGNOSIS — Y93H2 Activity, gardening and landscaping: Secondary | ICD-10-CM | POA: Insufficient documentation

## 2018-02-10 DIAGNOSIS — S00431A Contusion of right ear, initial encounter: Secondary | ICD-10-CM | POA: Insufficient documentation

## 2018-02-10 MED ORDER — LIDOCAINE-EPINEPHRINE (PF) 2 %-1:200000 IJ SOLN: 20.0000 mL | Freq: Once | INTRAMUSCULAR | Status: DC

## 2018-02-10 MED ORDER — DOXYCYCLINE HYCLATE 100 MG PO CAPS: 100.0000 mg | ORAL_CAPSULE | Freq: Two times a day (BID) | ORAL | 0 refills | Status: AC

## 2018-02-10 MED ORDER — LIDOCAINE HCL 2 % IJ SOLN
10.0000 mL | Freq: Once | INTRAMUSCULAR | Status: AC
  Administered 2018-02-10: 200 mg
  Filled 2018-02-10: qty 20

## 2018-02-10 NOTE — ED Provider Notes (Signed)
[facility] EMERGENCY DEPARTMENT Provider Note   CSN: [id number] Arrival date & time: 02/10/18  1712     History   Chief Complaint Chief Complaint  Patient presents with  . Otalgia    HPI Rodney Barber is a 52 y.o. male.  HPI   Pt is a 52 y/o male with a h/o T2DM, pacnreatitis who presents to the ED today c/o right ear pain and swelling that began about 10 days ago. He states he was working in the yard with a Risk manager and a rock hit him in the right ear. Symptoms started suddenly. Sxs are constant and have gradually worsened since onset. He tried taking benadryl with no relief. Denies any other exacerbating or alleviating factors.  He denies any fevers. Has reduced hearing due to the swelling. Denies any drainage from the ear. Denies any other sxs.   Past Medical History:  Diagnosis Date  . Diabetes mellitus   . Pancreatitis     Patient Active Problem List   Diagnosis Date Noted  . Urine test positive for microalbuminuria 07/12/2017  . DM (diabetes mellitus) type 2, uncontrolled, with ketoacidosis ([place]) 09/08/2013  . Liver lesion 07/22/2013  . Alcohol-induced chronic pancreatitis ([place]) 07/22/2013  . Diabetes ([place]) 07/18/2013  . Hypokalemia 07/11/2013  . Protein-calorie malnutrition, severe ([place]) 07/11/2013  . Chronic pancreatitis ([place]) 07/10/2013  . 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 Diabetes mellitus ([place]) 07/10/2013  . Weight loss 07/10/2013  . Diarrhea 07/10/2013  . Liver lesion, right lobe 07/10/2013    Past Surgical History:  Procedure Laterality Date  . ERCP  06/27/2011   Procedure: ENDOSCOPIC RETROGRADE CHOLANGIOPANCREATOGRAPHY (ERCP);  Surgeon: Jeryl Columbia, MD;  Location: [facility];  Service: Endoscopy;  Laterality: N/A;  . ERCP W/ PLASTIC STENT PLACEMENT  03/2011        Home Medications    Prior to Admission medications   Medication Sig Start Date End Date Taking? Authorizing Provider  Blood Glucose  Monitoring Suppl (TRUERESULT BLOOD GLUCOSE) w/Device KIT uad 04/23/17   Alfonse Spruce, FNP  doxycycline (VIBRAMYCIN) 100 MG capsule Take 1 capsule (100 mg total) by mouth 2 (two) times daily for 5 days. 02/10/18 02/15/18  Vicie Cech S, PA-C  glipiZIDE (GLUCOTROL) 5 MG tablet Take 1 tablet (5 mg total) by mouth 2 (two) times daily before a meal. 09/21/17   Hairston, Mandesia R, FNP  Glucose Blood (TRUE METRIX BLOOD GLUCOSE TEST VI) 1 each by Does not apply route once. 04/23/17   [provider]  glucose blood (TRUE METRIX BLOOD GLUCOSE TEST) test strip Use as instructed 04/23/17   Alfonse Spruce, FNP  hydrOXYzine (ATARAX/VISTARIL) 25 MG tablet Take 1 tablet (25 mg total) by mouth every 6 (six) hours. Patient not taking: Reported on 09/21/2017 12/17/16   Ashley Murrain, NP  insulin detemir (LEVEMIR) 100 UNIT/ML injection Inject 0.25 mLs (25 Units total) into the skin at bedtime. 12/26/17   Argentina Donovan, PA-C  lipase/protease/amylase (CREON) 12000 units CPEP capsule Take 1 capsule (12,000 Units total) by mouth 3 (three) times daily with meals. 10/19/17   Gildardo Pounds, NP  lisinopril (PRINIVIL,ZESTRIL) 5 MG tablet Take 1 tablet (5 mg total) by mouth daily. 09/21/17   Alfonse Spruce, FNP  TRUEPLUS LANCETS 28G MISC uad 04/23/17   Alfonse Spruce, FNP    Family History Family History  Problem Relation Age of Onset  . Diabetes Mother   . Diabetes Brother  Social History Social History   Tobacco Use  . Smoking status: Current Every Day Smoker    Packs/day: 0.50    Years: 30.00    Pack years: 15.00    Types: Cigarettes  . Smokeless tobacco: Never Used  . Tobacco comment: 6 cigarett /day  Substance Use Topics  . Alcohol use: No  . Drug use: Yes    Types: "Crack" cocaine, Other-see comments, Cocaine    Comment: last smoked crack 12-16-16     Allergies   Patient has no known allergies.   Review of Systems Review of Systems  Constitutional: Negative  for chills and fever.  HENT: Positive for ear pain. Negative for congestion, rhinorrhea and sore throat.   Eyes: Negative for visual disturbance.  Respiratory: Negative for shortness of breath.   Cardiovascular: Negative for chest pain.  Gastrointestinal: Negative for diarrhea, nausea and vomiting.  Genitourinary: Negative for dysuria and frequency.  Musculoskeletal: Negative for back pain.  Skin: Negative for wound.  Neurological: Negative for dizziness, weakness, light-headedness, numbness and headaches.   Physical Exam Updated Vital Signs BP (!) 150/85 (BP Location: Right Arm)   Pulse 68   Temp 98 F (36.7 C) (Oral)   Resp 16   Ht _0  (1.727 m)   Wt 60.8 kg (134 lb)   SpO2 100%   BMI 20.37 kg/m   Physical Exam  Constitutional: He is oriented to person, place, and time. He appears well-developed and well-nourished. No distress.  HENT:  Nose: Nose normal.  Mouth/Throat: Oropharynx is clear and moist. No oropharyngeal exudate.  Left TM without erythema or effusion.  Right TM appears retracted but otherwise no effusion or erythema noted.  Right auricle is swollen, warm and erythematous.  Auricular hematoma present.  Eyes: Pupils are equal, round, and reactive to light. Conjunctivae and EOM are normal.  Neck: Neck supple.  Cardiovascular: Normal rate and regular rhythm.  Pulmonary/Chest: Effort normal and breath sounds normal.  Neurological: He is alert and oriented to person, place, and time.  Skin: Skin is warm and dry. Capillary refill takes less than 2 seconds.  Psychiatric: He has a normal mood and affect.       ED Treatments / Results  Labs (all labs ordered are listed, but only abnormal results are displayed) Labs Reviewed - No data to display  EKG None  Radiology No results found.  Procedures .Marland KitchenIncision and Drainage Date/Time: 02/10/2018 7:10 PM Performed by: Rodney Booze, PA-C Authorized by: Rodney Booze, PA-C   Consent:    Consent  obtained:  Verbal   Consent given by:  Patient   Risks discussed:  Incomplete drainage and infection   Alternatives discussed:  No treatment Location:    Indications for incision and drainage: auricular hematoma.   Location: right ear. Pre-procedure details:    Skin preparation:  Betadine Anesthesia (see MAR for exact dosages):    Anesthesia method:  Local infiltration   Local anesthetic:  Lidocaine 2% w/o epi Procedure type:    Complexity:  Simple Procedure details:    Needle aspiration: yes     Needle size:  18 G   Drainage:  Bloody   Drainage amount: 5cc.   Packing materials:  None Post-procedure details:    Patient tolerance of procedure:  Tolerated well, no immediate complications   (including critical care time)  Medications Ordered in ED Medications  lidocaine (XYLOCAINE) 2 % (with pres) injection 200 mg (200 mg Infiltration Given 02/10/18 1900)     Initial Impression /  Assessment and Plan / ED Course  I have reviewed the triage vital signs and the nursing notes.  Pertinent labs & imaging results that were available during my care of the patient were reviewed by me and considered in my medical decision making (see chart for details).    Final Clinical Impressions(s) / ED Diagnoses   Final diagnoses:  Hematoma of right pinna, initial encounter   Patient presented the ER with auricular hematoma to the right ear that occurred after a rock embedded in the right ear.  His TM is intact though it is retracted.  Vital signs stable.  Afebrile.  Hematoma is mildly warm and erythematous to touch.  Area was incised and drained in the ED.  Wound dressing was applied.  He will be given a prescription for antibiotics for potential cellulitis to the area.  He was given instructions to follow-up with the ear nose and throat doctor and to return to the ER for any new or worsening symptoms in the meantime.  ED Discharge Orders        Ordered    doxycycline (VIBRAMYCIN) 100 MG capsule   2 times daily     02/10/18 1907       Rodney Booze, PA-C 02/11/18 0059    Milton Ferguson, MD 02/12/18 1031

## 2018-02-10 NOTE — ED Triage Notes (Signed)
Pt reports he was weed whacking and a rock hit his right ear.  Ear is swollen and Pt reports he cant hear well out of ear.

## 2018-02-10 NOTE — ED Notes (Signed)
Pt verbalized understanding discharge instructions and denies any further needs or questions at this time. VS stable, ambulatory and steady gait.   

## 2018-02-10 NOTE — Discharge Instructions (Addendum)
You were given a prescription for antibiotics. Please take the antibiotic prescription fully.   You were given a referral to an ear nose and throat doctor.  You need to call the office to make a follow-up appointment for reevaluation in 1 week.  Please return to the ER for any new or worsening symptoms in the meantime including fevers, increased redness, increased swelling or pain.

## 2018-02-11 MED FILL — DOXYCYCLINE HYCLATE 100 MG: 100 | 5 days supply | Qty: 10 | Fill #0

## 2018-02-25 ENCOUNTER — Encounter (HOSPITAL_COMMUNITY): Payer: Self-pay | Admitting: *Deleted

## 2018-02-25 ENCOUNTER — Other Ambulatory Visit: Payer: Self-pay

## 2018-02-25 ENCOUNTER — Emergency Department (HOSPITAL_COMMUNITY)
Admission: EM | Admit: 2018-02-25 | Discharge: 2018-02-25 | Disposition: A | Discharge status: Home / Self Care | Payer: Self-pay | Attending: Emergency Medicine | Admitting: Emergency Medicine

## 2018-02-25 DIAGNOSIS — W228XXD Striking against or struck by other objects, subsequent encounter: Secondary | ICD-10-CM | POA: Insufficient documentation

## 2018-02-25 DIAGNOSIS — F17210 Nicotine dependence, cigarettes, uncomplicated: Secondary | ICD-10-CM | POA: Insufficient documentation

## 2018-02-25 DIAGNOSIS — S00431D Contusion of right ear, subsequent encounter: Secondary | ICD-10-CM | POA: Insufficient documentation

## 2018-02-25 DIAGNOSIS — Z794 Long term (current) use of insulin: Secondary | ICD-10-CM | POA: Insufficient documentation

## 2018-02-25 DIAGNOSIS — E119 Type 2 diabetes mellitus without complications: Secondary | ICD-10-CM | POA: Insufficient documentation

## 2018-02-25 DIAGNOSIS — R220 Localized swelling, mass and lump, head: Secondary | ICD-10-CM | POA: Insufficient documentation

## 2018-02-25 DIAGNOSIS — Z79899 Other long term (current) drug therapy: Secondary | ICD-10-CM | POA: Insufficient documentation

## 2018-02-25 MED ORDER — ACETAMINOPHEN 500 MG PO TABS
1000.0000 mg | ORAL_TABLET | Freq: Once | ORAL | Status: AC
  Administered 2018-02-25: 1000 mg via ORAL
  Filled 2018-02-25: qty 2

## 2018-02-25 NOTE — ED Triage Notes (Signed)
Pt had injury to right ear over two weeks ago. Was seen on 7/7 for same. States it was drained and took antibiotics as ordered. Still has significant swelling to right ear. Denies fever.

## 2018-02-25 NOTE — Discharge Instructions (Signed)
Keep pressure dressing on as much as possible, and follow up with ENT as we discussed. It is very important that you avoid further injury to the ear. Tylenol as needed for pain. Monitor for signs of infection such as redness or warmth, or any fevers.

## 2018-02-25 NOTE — ED Provider Notes (Signed)
Patient placed in Quick Look pathway, seen and evaluated   Chief Complaint: earlobe swelling  HPI:   Pt hit in ear with a rock   ROS: no fever, no headache  Physical Exam:   Gen: No distress  Neuro: Awake and Alert  Skin: Warm    Focused Exam: swollen right earlobe   Initiation of care has begun. The patient has been counseled on the process, plan, and necessity for staying for the completion/evaluation, and the remainder of the medical screening examination   Osie Cheeks 02/25/18 Morton Amy, MD 02/27/18 [phone]
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
## 2018-03-06 ENCOUNTER — Ambulatory Visit: Payer: Self-pay | Admitting: Nurse Practitioner

## 2018-03-15 ENCOUNTER — Encounter: Payer: Self-pay | Admitting: Family Medicine

## 2018-03-15 ENCOUNTER — Ambulatory Visit: Payer: Self-pay | Attending: Nurse Practitioner | Admitting: Family Medicine

## 2018-03-15 ENCOUNTER — Telehealth: Payer: Self-pay

## 2018-03-15 VITALS — BP 143/69 | HR 66 | Temp 98.0°F | Ht 68.0 in | Wt 116.8 lb

## 2018-03-15 DIAGNOSIS — I10 Essential (primary) hypertension: Secondary | ICD-10-CM

## 2018-03-15 DIAGNOSIS — K860 Alcohol-induced chronic pancreatitis: Secondary | ICD-10-CM

## 2018-03-15 DIAGNOSIS — S00431A Contusion of right ear, initial encounter: Secondary | ICD-10-CM

## 2018-03-15 DIAGNOSIS — IMO0001 Reserved for inherently not codable concepts without codable children: Secondary | ICD-10-CM

## 2018-03-15 DIAGNOSIS — E43 Unspecified severe protein-calorie malnutrition: Secondary | ICD-10-CM

## 2018-03-15 DIAGNOSIS — R319 Hematuria, unspecified: Secondary | ICD-10-CM

## 2018-03-15 DIAGNOSIS — IMO0002 Reserved for concepts with insufficient information to code with codable children: Secondary | ICD-10-CM

## 2018-03-15 DIAGNOSIS — K8689 Other specified diseases of pancreas: Secondary | ICD-10-CM

## 2018-03-15 DIAGNOSIS — E1165 Type 2 diabetes mellitus with hyperglycemia: Secondary | ICD-10-CM

## 2018-03-15 DIAGNOSIS — Z681 Body mass index (BMI) 19 or less, adult: Secondary | ICD-10-CM | POA: Insufficient documentation

## 2018-03-15 DIAGNOSIS — E119 Type 2 diabetes mellitus without complications: Secondary | ICD-10-CM | POA: Insufficient documentation

## 2018-03-15 DIAGNOSIS — E0865 Diabetes mellitus due to underlying condition with hyperglycemia: Secondary | ICD-10-CM

## 2018-03-15 DIAGNOSIS — Z794 Long term (current) use of insulin: Secondary | ICD-10-CM

## 2018-03-15 DIAGNOSIS — X58XXXA Exposure to other specified factors, initial encounter: Secondary | ICD-10-CM | POA: Insufficient documentation

## 2018-03-15 LAB — POCT UA - MICROALBUMIN
Creatinine, POC: 200 mg/dL
Microalbumin Ur, POC: 150 mg/L

## 2018-03-15 LAB — POCT URINALYSIS DIP (CLINITEK)
Bilirubin, UA: NEGATIVE
Glucose, UA: 250 mg/dL — AB
Ketones, POC UA: NEGATIVE mg/dL
Leukocytes, UA: NEGATIVE
Nitrite, UA: NEGATIVE
Spec Grav, UA: >=1.03 — AB
Urobilinogen, UA: 0.2 U/dL

## 2018-03-15 LAB — GLUCOSE, POCT (MANUAL RESULT ENTRY): POC Glucose: 69 mg/dL — AB (ref 70–99)

## 2018-03-15 LAB — POCT GLYCOSYLATED HEMOGLOBIN (HGB A1C): HbA1c, POC (controlled diabetic range): 14.4 % — AB (ref 0.0–7.0)

## 2018-03-15 MED ORDER — AMOXICILLIN 500 MG PO CAPS: 500.0000 mg | ORAL_CAPSULE | Freq: Two times a day (BID) | ORAL | 0 refills | Status: AC

## 2018-03-15 MED ORDER — PANCRELIPASE (LIP-PROT-AMYL) 12000-38000 UNITS PO CPEP: 12000.0000 [IU] | ORAL_CAPSULE | Freq: Three times a day (TID) | ORAL | 3 refills | Status: DC

## 2018-03-15 MED ORDER — INSULIN DETEMIR 100 UNIT/ML ~~LOC~~ SOLN: 25.0000 [IU] | Freq: Every day | SUBCUTANEOUS | 11 refills | Status: DC

## 2018-03-15 MED FILL — !LEVEMIR 100 UNITS/ML VIAL: 100/ML | 40 days supply | Qty: 10 | Fill #0

## 2018-03-15 MED FILL — AMOXICILLIN 500 MG CAPSULE: 500 | 10 days supply | Qty: 20 | Fill #0

## 2018-03-15 NOTE — Telephone Encounter (Signed)
-----   Message from Cain Saupe, MD sent at 03/15/2018 11:34 AM EDT ----- Patient has presence of blood in his urine (this has been present on other recent UA's). I will place a referral for Urology as well as an order for renal ultrasound.

## 2018-03-15 NOTE — Progress Notes (Signed)
Subjective:    Patient ID: Rodney Barber, male    DOB: 31-Jan-1966, 52 y.o.   MRN: [id number]  HPI 52 yo male seen in follow-up of chronic medical issues including diabetes with long term insulin use secondary to chronic pancreatitis, hypertension, protein calorie malnutrition and on review of labs, patient has had recurrent hematuria on urinalysis.  Patient also with recent visits to the ED due to a hematoma of the right ear.  Patient reports that he does have some current discomfort in the right ear as his ear has started swelling again.  Patient denies any current abdominal pain related to chronic pancreatitis.  Patient states that he is completely stopped using alcohol.  Patient denies headaches or dizziness related to his blood pressure.  Patient reports that he is compliant with his insulin and usually takes 25 units at night but sometimes adjust his insulin based on his blood sugar readings.  Patient states that he is checking blood sugars 3 times daily.  Patient would like to have a refill of Creon as he states when he runs out of this medication he tends to have diarrhea.  Patient does try to eat a healthy diet.  Patient denies any blood in the stool or dark stools.  Patient states that his blood sugars are up and down but mostly remained in the 200s.  Patient has occasional urinary frequency.  Patient denies dysuria.  Patient has seen no visible blood in the urine.  Patient denies any fever or chills.  Patient denies shortness of breath or cough.    Review of Systems  Constitutional: Positive for fatigue. Negative for fever.  HENT: Positive for ear pain (pain/pressure in right outer ear). Negative for trouble swallowing.   Respiratory: Negative for cough and shortness of breath.   Cardiovascular: Negative for chest pain, palpitations and leg swelling.  Gastrointestinal: Negative for abdominal pain and nausea.  Endocrine: Negative for polydipsia, polyphagia and polyuria.  Genitourinary:  Negative for dysuria and frequency.  Neurological: Negative for dizziness and headaches.       Objective:   Physical Exam  Constitutional: He is oriented to person, place, and time.  Thin framed male in NAD but visible swelling/deformity of right outer ear  HENT:  Left Ear: External ear normal.  Nose: Nose normal.  Mouth/Throat: Oropharynx is clear and moist.  Eyes: Pupils are equal, round, and reactive to light. Conjunctivae and EOM are normal.  Neck: Normal range of motion. Neck supple. No thyromegaly present.  Cardiovascular: Normal rate and regular rhythm.  Pulmonary/Chest: Effort normal and breath sounds normal.  Abdominal: Soft. There is no tenderness. There is no guarding.  Musculoskeletal:  No CVA tenderness  Lymphadenopathy:    He has no cervical adenopathy.  Neurological: He is alert and oriented to person, place, and time. No cranial nerve deficit.  Skin:  Right outer ear is swollen and skin is tense but slightly compressible and slightly warm to touch versus surrounding skin and as compared to the left ear   Nursing note and vitals reviewed.  BP (!) 143/69   Pulse 66   Temp 98 F (36.7 C) (Oral)   Ht 5' 8"  (1.727 m)   Wt 116 lb 12.8 oz (53 kg)   SpO2 100%   BMI 17.76 kg/m    Procedure note: After obtaining informed consent, outer right ear was cleansed with betadine swab and time-out performed to confirm location of procedure. A 20 gauge needle was used to aspirate 90m's of serosangious fluid  with some firmer cloudy yellow-beige particles. Patient tolerated the procedure without problems. No bleeding occurred. Area was re-cleansed with an alcohol pad. After care instructions provided to patient.     Assessment & Plan:  1. Uncontrolled type 2 diabetes mellitus with hyperglycemia ([place]) Patient with history of uncontrolled diabetes and his HgbA1c indicated continued poor control his A1c was 14.4.  I discussed with the patient that because of his prior pancreatitis,  that he will always require daily insulin as his pancreas no longer makes enough insulin to meet his needs regarding control of blood sugars. Patient is asked to keep a log of blood sugars.  Patient has met with clinical pharmacist in the past and this may be an option to help with better control of his blood sugars.  Hopefully if patient has been noncompliant, the discussion regarding why he needs insulin may help improve compliance and blood sugar control. - Glucose (CBG) - HgB A1c - POCT UA - Microalbumin - POCT URINALYSIS DIP (CLINITEK)  2. Hematuria, unspecified type Patient has had hematuria on previous urinalysis on review of his chart prior to today's visit.  Patient had repeat urinalysis today which again showed presence of RBCs.  Patient denies any gross hematuria.  Patient will be referred to urology and will have renal ultrasound done in follow-up. - UA - US Renal; Future - Ambulatory referral to Urology  3. 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  There is some question on past review of his chart whether or not he has been compliant with his insulin use in the past.  I discussed the role of pancreatic insufficiency and decrease insulin production with the patient and hopefully this can help to increase his compliance.  Patient was also provided with refill of his Creon to help with digestive issues.  4. Hematoma of right ear, initial encounter Patient with presence of hematoma of the right ear for which he has seen emergency department and has had aspiration/evacuation of fluid x2 over the past few months.  This is my first encounter with the patient regarding the hematoma of the ear as he is new to me as a patient.  Procedure was performed to remove  fluid from the hematoma in his right outer ear, see procedure note.  Patient did have improvement in discomfort after removal of fluid.  Patient will be placed on amoxicillin due to concern of possible infection and to see if this helps to reduce recurrence of fluid collection/hematoma.  Patient education provided.  Patient should return to clinic in the next 1 to 2 weeks or as needed if he has recurrence of swelling of the right ear, increased pain or any concerns. - amoxicillin (AMOXIL) 500 MG capsule; Take 1 capsule (500 mg total) by mouth 2 (two) times daily for 10 days. To treat infection  Dispense: 20 capsule; Refill: 0  5. Alcohol-induced chronic pancreatitis [facility]) Patient reports that he has completely stopped all alcohol use.  Patient is provided with refill of Creon to help with his pancreatic insufficiency - lipase/protease/amylase (CREON) 12000 units CPEP capsule; Take 1 capsule (12,000 Units total) by mouth 3 (three) times daily with meals.  Dispense: 270 capsule; Refill: 3  6. Protein-calorie malnutrition, severe ([place]) Patient is encouraged to take supplements such as Ensure and continue use of Creon to help with weight gain.  Some of patient's past weight gain has also been related to his uncontrolled diabetes this patient has also had ketoacidosis in the past.  Hopefully patient will become more compliant with daily use of insulin.  7. Uncontrolled type 2 diabetes mellitus without complication, with long-term current use of insulin ([place]) Patient provided with refill of insulin - insulin detemir (LEVEMIR) 100 UNIT/ML injection; Inject 0.25 mLs (25 Units total) into the skin at bedtime.  Dispense: 10 mL; Refill: 11  8. Essential hypertension Patient is encouraged to continue his use of lisinopril which he is taking for blood pressure as well as renal protection.  If blood pressure is elevated at his next visit, his lisinopril dose will be increased  An After Visit Summary was printed  and given to the patient.  Return in about 4 months (around 07/15/2018), or as needed for ear pain, for 4 month f/u diabetes/chronic medical conditions.

## 2018-03-15 NOTE — Telephone Encounter (Signed)
Patient was called and a message was left informing patient to contact office for lab results.   If patient returns phone call please inform patient of lab results.

## 2018-03-15 NOTE — Patient Instructions (Signed)
Chronic Pancreatitis Chronic pancreatitis is long-lasting inflammation and scarring of the pancreas. The pancreas is a gland that is located behind the stomach. It produces enzymes that help to digest food. The pancreas also releases the hormones glucagon and insulin, which help to regulate blood sugar. Damage to the pancreas may affect digestion, cause pain in the upper abdomen and back, and cause diabetes. Inflammation can also irritate other abdominal organs near the pancreas. At the very beginning, pancreatitis may be sudden (acute). If acute pancreatitis is not caught in time or treated effectively, or if you have several or prolonged episodes of acute pancreatitis, then the condition can turn into chronic pancreatitis. What are the causes? The most common cause of this condition is alcohol abuse. Other causes include:  High levels of triglycerides in the blood (hypertriglyceridemia).  Gallstones or other conditions that can block the tube that drains the pancreas (pancreatic duct).  Pancreatic cancer.  Cystic fibrosis.  Too much calcium in the blood (hypercalcemia), which may be caused by an overactive parathyroid gland (hyperparathyroidism).  Certain medicines.  Injury to the pancreas.  Infection.  Autoimmune pancreatitis. This is when the body's disease-fighting (immune) system attacks the pancreas.  Genes that are passed along from parent to child (inherited).  In some cases, the cause may not be known. What increases the risk? This condition is more likely to develop in:  Men.  People who are 40-60 years old.  What are the signs or symptoms? Symptoms of this condition may include:  Abdominal pain. Pain may also be felt in the upper back and may get worse after eating.  Nausea and vomiting.  Fever.  Weight loss.  A change in the color and consistency of bowel movements, such as diarrhea.  How is this diagnosed? This condition is diagnosed based on your  symptoms, your medical history, and a physical exam. You may have tests, such as:  Blood tests.  Stool samples.  Biopsy of the pancreas. This is the removal of a small amount of pancreas tissue to be tested in a lab.  Imaging studies, such as: ? X-rays. ? CT scan. ? MRI. ? Ultrasound.  How is this treated? The goal of treatment is to help relieve symptoms and to prevent complications from occurring. Treatment focuses on:  Resting the pancreas. You may need to stop eating and drinking for a few days while in the hospital to give your pancreas time to recover. During this time, you will be given IV fluids to keep you hydrated.  Controlling pain. You may be given pain medicines by mouth (orally) or as injections.  Improving digestion. You may be given: ? Medicines to help balance your enzymes. ? Vitamin supplements. ? A specific diet to follow. If you are given a diet, you may work with a specialist (dietitian).  Preventing diabetes. You may need insulin injections.  You may have surgery to:  Clear the pancreatic ducts of any blockages, such as gallstones.  Remove any fluid or damaged tissue from the pancreas.  Follow these instructions at home:  Take over-the-counter and prescription medicines only as told by your health care provider. This includes any vitamin supplements.  Do not drive or operate heavy machinery while taking prescription pain medicines.  Drink enough fluid to keep your urine clear or pale yellow.  Do not drink alcohol. If you need help quitting, ask your health care provider.  Do not use any tobacco products, such as cigarettes, chewing tobacco, and e-cigarettes. If you need help   quitting, ask your health care provider.  Follow a diet as told by your health care provider or dietitian, if this applies. This may include: ? Limiting how much fat you eat. ? Eating smaller meals more often. ? Avoiding caffeine.  Keep all follow-up visits as told by your  health care provider. This is important. Contact a health care provider if:  You have pain that does not get better with medicine.  You have a fever. Get help right away if:  Your pain suddenly gets worse.  You have sudden abdominal swelling.  You start to vomit often or you vomit blood.  You have diarrhea that does not go away.  You have blood in your stool. This information is not intended to replace advice given to you by your health care provider. Make sure you discuss any questions you have with your health care provider. Document Released: 08/20/2015 Document Revised: 12/30/2015 Document Reviewed: 07/01/2014 [employer] Interactive Patient Education  2018 [employer]  Cauliflower Ear Cauliflower ear refers to ear damage that causes the outer part of the ear (pinna) to be shapeless and lumpy. This usually results from repeated hard, direct hits or injuries to the outer ear that cause a collection of fluid or blood (hematoma)to form. Wearing protective headgear can help to prevent the condition. It is important to get medical attention for any outer ear injury. Infection is common in outer ear injuries, and this can increase the risk for cauliflower ear. The fluid can reduce or block blood flow to the ear cartilage and change the appearance of the ear. What are the causes? This condition is commonly caused by repeated blunt trauma to the ear, such as trauma from boxing or wresting. Cauliflower ear may also be caused by piercings, especially in the upper ear. What are the signs or symptoms? Symptoms of this condition include:  Pain.  Swelling.  Bruising.  Deformed ear shape. The ear may look lumpy and larger than normal.  In severe cases, symptoms may include:  Ringing in the ear (tinnitus).  Hearing loss.  Headache.  Severe bleeding.  Blurred vision.  Facial swelling.  How is this diagnosed? This condition may be diagnosed based on a physical exam and your  response to questions about the history of trauma to your ear. If your ear is injured, seek treatment right away to reduce the risk that your outer ear will be deformed. How is this treated? Outer ear injury is treated by:  Aspiration. For this procedure, a needle is used to drain the hematoma. This may prevent more damage to the ear.  Constant compression. This means applying constant pressure to the ear to help prevent fluid and blood from building up again. Compression may be applied to your ear with: ? Bandages (dressings). ? Two formed molds that fit together around the ear (bolster).  Stitching (suturing) a drainage tube onto your ear to allow fluid and blood to drain over time. This may be done in severe cases.  Antibiotic medicines.  Pain medicines or cold therapy to help relieve pain and swelling.  Corrective surgery (otoplasty) may be needed to remove scar tissue and reshape the ear. Follow these instructions at home:  If wearing ear compression, follow instructions about how long to keep this in place. Usually, this is about a week.  Take over-the-counter and prescription medicines only as told by your health care provider.  If you were prescribed an antibiotic, take it as told by your health care provider. Do not  stop taking the antibiotic even if you start to feel better.  If directed, put ice on your ear: ? Put ice in a plastic bag. ? Place a towel between your skin and the bag. ? Leave the ice on for 20 minutes, 2-3 times a day.  Return to your normal activities as told by your health care provider. Ask your health care provider what activities are safe for you.  Wear protective headgear when you participate in contact sports like wrestling.  Monitor your ear for any signs of fluid or blood buildup, such as more pain or swelling.  Keep all follow-up visits as told by your health care provider. This is important. Contact a health care provider if:  You have  redness, warmth, and swelling that does not improve in a few days. Get help right away if:  You have redness that spreads to other areas of your head or neck.  You have a fever. Summary  Injury to the outer part of the ear (pinna) may cause a collection of fluid or blood (hematoma) to form.  The most common cause of cauliflower ear is a direct hit or repeated hits to the ear, which cause hematomas.  Contact your health care provider if you have an injury to your outer ear. The earlier you get treatment, the less likely you are to develop cauliflower ear. This information is not intended to replace advice given to you by your health care provider. Make sure you discuss any questions you have with your health care provider. Document Released: 09/22/2016 Document Revised: 09/22/2016 Document Reviewed: 09/22/2016 [employer] Interactive Patient Education  [employer].

## 2018-05-22 MED FILL — $LEVEMIR 100U/ML VIAL: 100 | 120 days supply | Qty: 30 | Fill #1

## 2018-07-15 ENCOUNTER — Ambulatory Visit: Payer: Self-pay | Admitting: Nurse Practitioner

## 2018-09-27 MED FILL — $LEVEMIR 100U/ML VIAL: 100 | 120 days supply | Qty: 30 | Fill #2

## 2018-12-02 ENCOUNTER — Emergency Department (HOSPITAL_COMMUNITY): Payer: Self-pay

## 2018-12-02 ENCOUNTER — Observation Stay (HOSPITAL_COMMUNITY): Payer: Self-pay

## 2018-12-02 ENCOUNTER — Encounter (HOSPITAL_COMMUNITY): Payer: Self-pay

## 2018-12-02 ENCOUNTER — Inpatient Hospital Stay (HOSPITAL_COMMUNITY)
Admission: EM | Admit: 2018-12-02 | Discharge: 2018-12-04 | DRG: 689 | Disposition: A | Discharge status: Home Health | Payer: Self-pay | Attending: Internal Medicine | Admitting: Internal Medicine

## 2018-12-02 DIAGNOSIS — E1165 Type 2 diabetes mellitus with hyperglycemia: Secondary | ICD-10-CM | POA: Diagnosis present

## 2018-12-02 DIAGNOSIS — I10 Essential (primary) hypertension: Secondary | ICD-10-CM | POA: Diagnosis present

## 2018-12-02 DIAGNOSIS — Z794 Long term (current) use of insulin: Secondary | ICD-10-CM

## 2018-12-02 DIAGNOSIS — R7989 Other specified abnormal findings of blood chemistry: Secondary | ICD-10-CM

## 2018-12-02 DIAGNOSIS — Z681 Body mass index (BMI) 19 or less, adult: Secondary | ICD-10-CM

## 2018-12-02 DIAGNOSIS — R4689 Other symptoms and signs involving appearance and behavior: Secondary | ICD-10-CM

## 2018-12-02 DIAGNOSIS — R059 Cough, unspecified: Secondary | ICD-10-CM | POA: Diagnosis present

## 2018-12-02 DIAGNOSIS — R809 Proteinuria, unspecified: Secondary | ICD-10-CM

## 2018-12-02 DIAGNOSIS — E43 Unspecified severe protein-calorie malnutrition: Secondary | ICD-10-CM

## 2018-12-02 DIAGNOSIS — Z833 Family history of diabetes mellitus: Secondary | ICD-10-CM

## 2018-12-02 DIAGNOSIS — Z79899 Other long term (current) drug therapy: Secondary | ICD-10-CM

## 2018-12-02 DIAGNOSIS — R945 Abnormal results of liver function studies: Secondary | ICD-10-CM | POA: Diagnosis present

## 2018-12-02 DIAGNOSIS — Z20828 Contact with and (suspected) exposure to other viral communicable diseases: Secondary | ICD-10-CM | POA: Diagnosis present

## 2018-12-02 DIAGNOSIS — R740 Nonspecific elevation of levels of transaminase and lactic acid dehydrogenase [LDH]: Secondary | ICD-10-CM | POA: Diagnosis present

## 2018-12-02 DIAGNOSIS — F17210 Nicotine dependence, cigarettes, uncomplicated: Secondary | ICD-10-CM | POA: Diagnosis present

## 2018-12-02 DIAGNOSIS — R4182 Altered mental status, unspecified: Secondary | ICD-10-CM

## 2018-12-02 DIAGNOSIS — F1410 Cocaine abuse, uncomplicated: Secondary | ICD-10-CM | POA: Diagnosis present

## 2018-12-02 DIAGNOSIS — E119 Type 2 diabetes mellitus without complications: Secondary | ICD-10-CM

## 2018-12-02 DIAGNOSIS — N179 Acute kidney failure, unspecified: Secondary | ICD-10-CM

## 2018-12-02 DIAGNOSIS — R05 Cough: Secondary | ICD-10-CM | POA: Diagnosis present

## 2018-12-02 DIAGNOSIS — N390 Urinary tract infection, site not specified: Principal | ICD-10-CM | POA: Diagnosis present

## 2018-12-02 DIAGNOSIS — R778 Other specified abnormalities of plasma proteins: Secondary | ICD-10-CM

## 2018-12-02 DIAGNOSIS — F329 Major depressive disorder, single episode, unspecified: Secondary | ICD-10-CM | POA: Diagnosis present

## 2018-12-02 LAB — COMPREHENSIVE METABOLIC PANEL
ALT: 79 U/L — ABNORMAL HIGH (ref 0–44)
AST: 190 U/L — ABNORMAL HIGH (ref 15–41)
Albumin: 2.9 g/dL — ABNORMAL LOW (ref 3.5–5.0)
Alkaline Phosphatase: 134 U/L — ABNORMAL HIGH (ref 38–126)
Anion gap: 16 — ABNORMAL HIGH (ref 5–15)
BUN: 26 mg/dL — ABNORMAL HIGH (ref 6–20)
CO2: 25 mmol/L (ref 22–32)
Calcium: 8.3 mg/dL — ABNORMAL LOW (ref 8.9–10.3)
Chloride: 98 mmol/L (ref 98–111)
Creatinine, Ser: 1.44 mg/dL — ABNORMAL HIGH (ref 0.61–1.24)
GFR calc Af Amer: >60 mL/min (ref >60)
GFR calc non Af Amer: 55 mL/min — ABNORMAL LOW (ref >60)
Glucose, Bld: 319 mg/dL — ABNORMAL HIGH (ref 70–99)
Potassium: 3.4 mmol/L — ABNORMAL LOW (ref 3.5–5.1)
Sodium: 139 mmol/L (ref 135–145)
Total Bilirubin: 0.8 mg/dL (ref 0.3–1.2)
Total Protein: 6 g/dL — ABNORMAL LOW (ref 6.5–8.1)

## 2018-12-02 LAB — POC OCCULT BLOOD, ED: Fecal Occult Bld: POSITIVE — AB

## 2018-12-02 LAB — CBC WITH DIFFERENTIAL/PLATELET
Abs Immature Granulocytes: 0.04 10*3/uL (ref 0.00–0.07)
Basophils Absolute: 0 10*3/uL (ref 0.0–0.1)
Basophils Relative: 0 %
Eosinophils Absolute: 0 10*3/uL (ref 0.0–0.5)
Eosinophils Relative: 0 %
HCT: 41.5 % (ref 39.0–52.0)
Hemoglobin: 13.3 g/dL (ref 13.0–17.0)
Immature Granulocytes: 1 %
Lymphocytes Relative: 16 %
Lymphs Abs: 1.1 10*3/uL (ref 0.7–4.0)
MCH: 29.1 pg (ref 26.0–34.0)
MCHC: 32 g/dL (ref 30.0–36.0)
MCV: 90.8 fL (ref 80.0–100.0)
Monocytes Absolute: 0.4 10*3/uL (ref 0.1–1.0)
Monocytes Relative: 5 %
Neutro Abs: 5.6 10*3/uL (ref 1.7–7.7)
Neutrophils Relative %: 78 %
Platelets: 255 10*3/uL (ref 150–400)
RBC: 4.57 MIL/uL (ref 4.22–5.81)
RDW: 13 % (ref 11.5–15.5)
WBC: 7.2 10*3/uL (ref 4.0–10.5)
nRBC: 0 % (ref 0.0–0.2)

## 2018-12-02 LAB — ETHANOL: Alcohol, Ethyl (B): <10 mg/dL (ref <10)

## 2018-12-02 LAB — POCT I-STAT EG7
Acid-Base Excess: 2 mmol/L (ref 0.0–2.0)
Bicarbonate: 26.1 mmol/L (ref 20.0–28.0)
Calcium, Ion: 0.96 mmol/L — ABNORMAL LOW (ref 1.15–1.40)
HCT: 38 % — ABNORMAL LOW (ref 39.0–52.0)
Hemoglobin: 12.9 g/dL — ABNORMAL LOW (ref 13.0–17.0)
O2 Saturation: 99 %
Potassium: 3.5 mmol/L (ref 3.5–5.1)
Sodium: 136 mmol/L (ref 135–145)
TCO2: 27 mmol/L (ref 22–32)
pCO2, Ven: 37.4 mmHg — ABNORMAL LOW (ref 44.0–60.0)
pH, Ven: 7.453 — ABNORMAL HIGH (ref 7.250–7.430)
pO2, Ven: 127 mmHg — ABNORMAL HIGH (ref 32.0–45.0)

## 2018-12-02 LAB — TROPONIN I: Troponin I: 0.04 ng/mL (ref <0.03)

## 2018-12-02 LAB — SALICYLATE LEVEL: Salicylate Lvl: <7 mg/dL (ref 2.8–30.0)

## 2018-12-02 LAB — CBG MONITORING, ED
Glucose-Capillary: 285 mg/dL — ABNORMAL HIGH (ref 70–99)
Glucose-Capillary: 282 mg/dL — ABNORMAL HIGH (ref 70–99)
Glucose-Capillary: 235 mg/dL — ABNORMAL HIGH (ref 70–99)
Glucose-Capillary: 186 mg/dL — ABNORMAL HIGH (ref 70–99)
Glucose-Capillary: 107 mg/dL — ABNORMAL HIGH (ref 70–99)

## 2018-12-02 LAB — LIPASE, BLOOD: Lipase: 16 U/L (ref 11–51)

## 2018-12-02 LAB — AMMONIA: Ammonia: 21 umol/L (ref 9–35)

## 2018-12-02 LAB — LACTIC ACID, PLASMA: Lactic Acid, Venous: 1.4 mmol/L (ref 0.5–1.9)

## 2018-12-02 LAB — ACETAMINOPHEN LEVEL: Acetaminophen (Tylenol), Serum: <10 ug/mL — ABNORMAL LOW (ref 10–30)

## 2018-12-02 MED ORDER — POTASSIUM CHLORIDE 10 MEQ/100ML IV SOLN
10.0000 meq | INTRAVENOUS | Status: AC
  Administered 2018-12-02 (×3): 10 meq via INTRAVENOUS
  Filled 2018-12-02 (×3): qty 100

## 2018-12-02 MED ORDER — SODIUM CHLORIDE 0.9 % IV BOLUS: 1000.0000 mL | Freq: Once | INTRAVENOUS | Status: DC

## 2018-12-02 MED ORDER — INSULIN REGULAR(HUMAN) IN NACL 100-0.9 UT/100ML-% IV SOLN
INTRAVENOUS | Status: DC
  Administered 2018-12-02: 19:00:00 2.2 [IU]/h via INTRAVENOUS
  Filled 2018-12-02: qty 100

## 2018-12-02 MED ORDER — SODIUM CHLORIDE 0.9 % IV BOLUS
1000.0000 mL | Freq: Once | INTRAVENOUS | Status: AC
  Administered 2018-12-02: 1000 mL via INTRAVENOUS

## 2018-12-02 MED ORDER — INSULIN ASPART 100 UNIT/ML ~~LOC~~ SOLN
0.0000 [IU] | SUBCUTANEOUS | Status: DC
  Administered 2018-12-03 (×2): 2 [IU] via SUBCUTANEOUS
  Administered 2018-12-03 (×3): 1 [IU] via SUBCUTANEOUS
  Administered 2018-12-04: 11:00:00 7 [IU] via SUBCUTANEOUS
  Administered 2018-12-04: 1 [IU] via SUBCUTANEOUS

## 2018-12-02 MED ORDER — PRO-STAT SUGAR FREE PO LIQD
30.0000 mL | Freq: Two times a day (BID) | ORAL | Status: DC
  Administered 2018-12-03 – 2018-12-04 (×3): 30 mL via ORAL
  Filled 2018-12-02 (×3): qty 30

## 2018-12-02 MED ORDER — SODIUM CHLORIDE 0.9 % IV SOLN
INTRAVENOUS | Status: DC
  Administered 2018-12-04: 08:00:00 via INTRAVENOUS

## 2018-12-02 MED ORDER — DEXTROSE-NACL 5-0.45 % IV SOLN
INTRAVENOUS | Status: DC
  Administered 2018-12-02: 21:00:00 via INTRAVENOUS

## 2018-12-02 NOTE — H&P (Addendum)
TRH H&P    Patient Demographics:    Rodney Barber, is a 53 y.o. male  MRN: [id number]  DOB - [birth year]  Admit Date - 12/02/2018  Referring MD/NP/PA: Rodell Perna  Outpatient Primary MD for the patient is Gildardo Pounds, NP  Patient coming from: home  Chief complaint- AMS, hyperglycemia   HPI:    Rodney Barber  is a 53 y.o. male, w dm2, hypertension, h/o pancreatitis, apparently presents with AMS and hyperglycemia. Pt appears somewhat confused and can't provide good history.   In ED,  T 97.7  P 60 R 14, Bp 132/63 pox 100%  Trop 0.04 Glucose 319  Wbc 7.2, Hgb 13.3, Plt 255 Na 139, K 3.4,  Bun 26, Creatinine 1.44 Calclium 8.3,  Alb 2.9 Ast 190, Alt 79, Alk phos 134, T. Bili 0.8 Ammonia 21 Lipase 16 ETOH <26 Tylenol <41 Salicylate level <7  Abg ph 7.453, pco2 37.4, Po2 127  CT brain IMPRESSION: No acute CT finding. Normal with exception of some atherosclerotic calcification of the major vessels at the base of the brain, often seen at this age.  RUQ ultrasound IMPRESSION: Normal study with exception of a very small amount of sludge in the gallbladder, of doubtful significance. There are no gallstones. There is no evidence of biliary obstruction. There is no evidence of liver parenchymal pathology.   Urine drug screen pending Urinalysis pending  Pt will be admitted for AMS and hyperglycemia, and abnormal liver function.           Review of systems:    In addition to the HPI above,  Unable to obtain very well due to AMS axox1  No Fever-chills, No Headache, No changes with Vision or hearing, No problems swallowing food or Liquids, No Chest pain, No Shortness of Breath, ++ Dry cough No Abdominal pain, No Nausea or Vomiting, bowel movements are regular, No Blood in stool or Urine, No dysuria, No new skin rashes or bruises, No new joints pains-aches,  No new  weakness, tingling, numbness in any extremity, No recent weight gain or loss, No polyuria, polydypsia or polyphagia, No significant Mental Stressors.  All other systems reviewed and are negative.    Past History of the following :    Past Medical History:  Diagnosis Date  . Diabetes mellitus   . Hypertension   . Pancreatitis       Past Surgical History:  Procedure Laterality Date  . ERCP  06/27/2011   Procedure: ENDOSCOPIC RETROGRADE CHOLANGIOPANCREATOGRAPHY (ERCP);  Surgeon: Jeryl Columbia, MD;  Location: [facility];  Service: Endoscopy;  Laterality: N/A;  . ERCP W/ PLASTIC STENT PLACEMENT  03/2011      Social History:      Social History   Tobacco Use  . Smoking status: Current Every Day Smoker    Packs/day: 0.50    Years: 30.00    Pack years: 15.00    Types: Cigarettes  . Smokeless tobacco: Never Used  . Tobacco comment: 6 cigarett /day  Substance Use Topics  . Alcohol use: No  Family History :     Family History  Problem Relation Age of Onset  . Diabetes Mother   . Diabetes Brother        Home Medications:   Prior to Admission medications   Medication Sig Start Date End Date Taking? Authorizing Provider  Blood Glucose Monitoring Suppl (TRUERESULT BLOOD GLUCOSE) w/Device KIT uad 04/23/17   Hairston, Toy Baker R, FNP  glipiZIDE (GLUCOTROL) 5 MG tablet Take 1 tablet (5 mg total) by mouth 2 (two) times daily before a meal. 09/21/17   Hairston, Mandesia R, FNP  glucose blood (TRUE METRIX BLOOD GLUCOSE TEST) test strip Use as instructed 04/23/17   Alfonse Spruce, FNP  insulin detemir (LEVEMIR) 100 UNIT/ML injection Inject 0.25 mLs (25 Units total) into the skin at bedtime. 03/15/18   Fulp, Cammie, MD  lipase/protease/amylase (CREON) 12000 units CPEP capsule Take 1 capsule (12,000 Units total) by mouth 3 (three) times daily with meals. 03/15/18   Fulp, Cammie, MD  lisinopril (PRINIVIL,ZESTRIL) 5 MG tablet Take 1 tablet (5 mg total) by mouth daily.  09/21/17   Alfonse Spruce, FNP  TRUEPLUS LANCETS 28G MISC uad 04/23/17   Alfonse Spruce, FNP     Allergies:    No Known Allergies   Physical Exam:   Vitals  Blood pressure 132/63, pulse 60, temperature 97.7 F (36.5 C), temperature source Oral, resp. rate 14, height _0  (1.676 m), SpO2 100 %.  1.  General: axox1  (person)  2. Psychiatric: euthymic  3. Neurologic: cn2-12 intact, reflexes 2+ symmetric, diffuse with no clonus, moves all 4 ext,  Motor 5/5 in all 4 ext  4. HEENMT:  Anicteric, pupils 1.19m symmetric, direct, consensual, near intact  5. Respiratory : CTAB  6. Cardiovascular : rrr s1, s2, no m/g/r  7. Gastrointestinal:  Abd: soft, nt, nd, +bs, negative murphy  8. Skin:  Ext: no c/c/e, no rash  9.Musculoskeletal:  Good rom  No adenopathy    Data Review:    CBC Recent Labs  Lab 12/02/18 1645 12/02/18 1830  WBC 7.2  --   HGB 13.3 12.9*  HCT 41.5 38.0*  PLT 255  --   MCV 90.8  --   MCH 29.1  --   MCHC 32.0  --   RDW 13.0  --   LYMPHSABS 1.1  --   MONOABS 0.4  --   EOSABS 0.0  --   BASOSABS 0.0  --    ------------------------------------------------------------------------------------------------------------------  Results for orders placed or performed during the hospital encounter of 12/02/18 (from the past 48 hour(s))  CBG monitoring, ED     Status: Abnormal   Collection Time: 12/02/18  4:28 PM  Result Value Ref Range   Glucose-Capillary 285 (H) 70 - 99 mg/dL  Lactic acid, plasma     Status: None   Collection Time: 12/02/18  4:30 PM  Result Value Ref Range   Lactic Acid, Venous 1.4 0.5 - 1.9 mmol/L    Comment: Performed at [facility] [address], [place] [id number] CBC with Differential     Status: None   Collection Time: 12/02/18  4:45 PM  Result Value Ref Range   WBC 7.2 4.0 - 10.5 K/uL   RBC 4.57 4.22 - 5.81 MIL/uL   Hemoglobin 13.3 13.0 - 17.0 g/dL   HCT 41.5 39.0 - 52.0 %   MCV 90.8 80.0 -  100.0 fL   MCH 29.1 26.0 - 34.0 pg   MCHC 32.0 30.0 -  36.0 g/dL   RDW 13.0 11.5 - 15.5 %   Platelets 255 150 - 400 K/uL   nRBC 0.0 0.0 - 0.2 %   Neutrophils Relative % 78 %   Neutro Abs 5.6 1.7 - 7.7 K/uL   Lymphocytes Relative 16 %   Lymphs Abs 1.1 0.7 - 4.0 K/uL   Monocytes Relative 5 %   Monocytes Absolute 0.4 0.1 - 1.0 K/uL   Eosinophils Relative 0 %   Eosinophils Absolute 0.0 0.0 - 0.5 K/uL   Basophils Relative 0 %   Basophils Absolute 0.0 0.0 - 0.1 K/uL   Immature Granulocytes 1 %   Abs Immature Granulocytes 0.04 0.00 - 0.07 K/uL    Comment: Performed at [facility] [address], [place], [place] [address]  Comprehensive metabolic panel     Status: Abnormal   Collection Time: 12/02/18  4:45 PM  Result Value Ref Range   Sodium 139 135 - 145 mmol/L   Potassium 3.4 (L) 3.5 - 5.1 mmol/L   Chloride 98 98 - 111 mmol/L   CO2 25 22 - 32 mmol/L   Glucose, Bld 319 (H) 70 - 99 mg/dL   BUN 26 (H) 6 - 20 mg/dL   Creatinine, Ser 1.44 (H) 0.61 - 1.24 mg/dL   Calcium 8.3 (L) 8.9 - 10.3 mg/dL   Total Protein 6.0 (L) 6.5 - 8.1 g/dL   Albumin 2.9 (L) 3.5 - 5.0 g/dL   AST 190 (H) 15 - 41 U/L   ALT 79 (H) 0 - 44 U/L   Alkaline Phosphatase 134 (H) 38 - 126 U/L   Total Bilirubin 0.8 0.3 - 1.2 mg/dL   GFR calc non Af Amer 55 (L) >60 mL/min   GFR calc Af Amer >60 >60 mL/min   Anion gap 16 (H) 5 - 15    Comment: Performed at [facility], [place] [address], [place], [place] [address]  Ammonia     Status: None   Collection Time: 12/02/18  4:45 PM  Result Value Ref Range   Ammonia 21 9 - 35 umol/L    Comment: Performed at [facility], [place] [address], [place], [place] [address]  Lipase, blood     Status: None   Collection Time: 12/02/18  4:45 PM  Result Value Ref Range   Lipase 16 11 - 51 U/L    Comment: Performed at [facility] [address] [place], [place] [address]  Ethanol     Status: None   Collection Time: 12/02/18  4:45 PM  Result Value Ref Range    Alcohol, Ethyl (B) <10 <10 mg/dL    Comment: (NOTE) Lowest detectable limit for serum alcohol is 10 mg/dL. For medical purposes only. Performed at [facility], [place] [address], [place], [place] [address]   Troponin I - ONCE - STAT     Status: Abnormal   Collection Time: 12/02/18  5:04 PM  Result Value Ref Range   Troponin I 0.04 (HH) <0.03 ng/mL    Comment: CRITICAL RESULT CALLED TO, READ BACK BY AND VERIFIED WITH: M.GAGE RN Irwin 12/02/2018 MCCORMICK K Performed at [facility], [place] [address], [place], [place] [address]   POCT I-Stat EG7     Status: Abnormal   Collection Time: 12/02/18  6:30 PM  Result Value Ref Range   pH, Ven 7.453 (H) 7.250 - 7.430   pCO2, Ven 37.4 (L) 44.0 - 60.0 mmHg   pO2, Ven  127.0 (H) 32.0 - 45.0 mmHg   Bicarbonate 26.1 20.0 - 28.0 mmol/L   TCO2 27 22 - 32 mmol/L   O2 Saturation 99.0 %   Acid-Base Excess 2.0 0.0 - 2.0 mmol/L   Sodium 136 135 - 145 mmol/L   Potassium 3.5 3.5 - 5.1 mmol/L   Calcium, Ion 0.96 (L) 1.15 - 1.40 mmol/L   HCT 38.0 (L) 39.0 - 52.0 %   Hemoglobin 12.9 (L) 13.0 - 17.0 g/dL   Patient temperature HIDE    Sample type VENOUS   Acetaminophen level     Status: Abnormal   Collection Time: 12/02/18  6:43 PM  Result Value Ref Range   Acetaminophen (Tylenol), Serum <10 (L) 10 - 30 ug/mL    Comment: (NOTE) Therapeutic concentrations vary significantly. A range of 10-30 ug/mL  may be an effective concentration for many patients. However, some  are best treated at concentrations outside of this range. Acetaminophen concentrations >150 ug/mL at 4 hours after ingestion  and >50 ug/mL at 12 hours after ingestion are often associated with  toxic reactions. Performed at [facility], [place] [address], [place], [place] [address]   Salicylate level     Status: None   Collection Time: 12/02/18  6:43 PM  Result Value Ref Range   Salicylate Lvl <6.0 2.8 - 30.0 mg/dL    Comment: Performed at [place]  [address], [place], [place] [address]  CBG monitoring, ED     Status: Abnormal   Collection Time: 12/02/18  6:50 PM  Result Value Ref Range   Glucose-Capillary 282 (H) 70 - 99 mg/dL   Comment 1 Notify RN   POC occult blood, ED Provider will collect     Status: Abnormal   Collection Time: 12/02/18  7:20 PM  Result Value Ref Range   Fecal Occult Bld POSITIVE (A) NEGATIVE  CBG monitoring, ED     Status: Abnormal   Collection Time: 12/02/18  8:09 PM  Result Value Ref Range   Glucose-Capillary 235 (H) 70 - 99 mg/dL  CBG monitoring, ED     Status: Abnormal   Collection Time: 12/02/18  9:14 PM  Result Value Ref Range   Glucose-Capillary 186 (H) 70 - 99 mg/dL    Chemistries  Recent Labs  Lab 12/02/18 1645 12/02/18 1830  NA 139 136  K 3.4* 3.5  CL 98  --   CO2 25  --   GLUCOSE 319*  --   BUN 26*  --   CREATININE 1.44*  --   CALCIUM 8.3*  --   AST 190*  --   ALT 79*  --   ALKPHOS 134*  --   BILITOT 0.8  --    ------------------------------------------------------------------------------------------------------------------  ------------------------------------------------------------------------------------------------------------------ GFR: CrCl cannot be calculated (Unknown ideal weight.). Liver Function Tests: Recent Labs  Lab 12/02/18 1645  AST 190*  ALT 79*  ALKPHOS 134*  BILITOT 0.8  PROT 6.0*  ALBUMIN 2.9*   Recent Labs  Lab 12/02/18 1645  LIPASE 16   Recent Labs  Lab 12/02/18 1645  AMMONIA 21   Coagulation Profile: No results for input(s): INR, PROTIME in the last 168 hours. Cardiac Enzymes: Recent Labs  Lab 12/02/18 1704  TROPONINI 0.04*   BNP (last 3 results) No results for input(s): PROBNP in the last 8760 hours. HbA1C: No results for input(s): HGBA1C in the last 72 hours. CBG: Recent Labs  Lab 12/02/18 1628 12/02/18 1850 12/02/18 2009 12/02/18 2114  GLUCAP 285* 282*  235* 186*   Lipid Profile: No results for input(s): CHOL, HDL, LDLCALC,  TRIG, CHOLHDL, LDLDIRECT in the last 72 hours. Thyroid Function Tests: No results for input(s): TSH, T4TOTAL, FREET4, T3FREE, THYROIDAB in the last 72 hours. Anemia Panel: No results for input(s): VITAMINB12, FOLATE, FERRITIN, TIBC, IRON, RETICCTPCT in the last 72 hours.  --------------------------------------------------------------------------------------------------------------- Urine analysis:    Component Value Date/Time   COLORURINE YELLOW 12/08/2010 1613   APPEARANCEUR CLEAR 12/08/2010 1613   LABSPEC 1.044 (H) 12/08/2010 1613   PHURINE 5.5 12/08/2010 1613   GLUCOSEU >1000 (A) 12/08/2010 1613   HGBUR NEGATIVE 12/08/2010 1613   BILIRUBINUR negative 03/15/2018 1049   BILIRUBINUR negative 12/26/2017 0941   KETONESUR negative 03/15/2018 1049   KETONESUR 15 (A) 12/08/2010 1613   PROTEINUR Negative 12/26/2017 0941   PROTEINUR NEGATIVE 12/08/2010 1613   UROBILINOGEN 0.2 03/15/2018 1049   UROBILINOGEN 0.2 12/08/2010 1613   NITRITE Negative 03/15/2018 1049   NITRITE negative 12/26/2017 0941   NITRITE NEGATIVE 12/08/2010 1613   LEUKOCYTESUR Negative 03/15/2018 1049      Imaging Results:    Ct Head Wo Contrast  Result Date: 12/02/2018 CLINICAL DATA:  Lethargic and altered mental status over the last 2 days. EXAM: CT HEAD WITHOUT CONTRAST TECHNIQUE: Contiguous axial images were obtained from the base of the skull through the vertex without intravenous contrast. COMPARISON:  None. FINDINGS: Brain: The brain shows a normal appearance without evidence of malformation, atrophy, old or acute small or large vessel infarction, mass lesion, hemorrhage, hydrocephalus or extra-axial collection. Vascular: There is mild atherosclerotic calcification of the major vessels at the base of the brain. Skull: Normal.  No traumatic finding.  No focal bone lesion. Sinuses/Orbits: Mild mucosal thickening of the right maxillary sinus. No layering fluid or advanced disease. Orbits negative. Other: None  significant IMPRESSION: No acute CT finding. Normal with exception of some atherosclerotic calcification of the major vessels at the base of the brain, often seen at this age. Electronically Signed   By: Nelson Chimes M.D.   On: 12/02/2018 19:00   US Abdomen Limited Ruq  Result Date: 12/02/2018 CLINICAL DATA:  Elevated liver function tests. EXAM: ULTRASOUND ABDOMEN LIMITED RIGHT UPPER QUADRANT COMPARISON:  None. FINDINGS: Gallbladder: There is a small amount of sludge dependent in the gallbladder but no evidence of gallstones. No Murphy sign. No wall thickening or adjacent fluid. Common bile duct: Diameter: 4.5 mm.  Normal. Liver: Normal echogenicity. No focal lesion. No biliary ductal dilatation. Portal vein is patent on color Doppler imaging with normal direction of blood flow towards the liver. IMPRESSION: Normal study with exception of a very small amount of sludge in the gallbladder, of doubtful significance. There are no gallstones. There is no evidence of biliary obstruction. There is no evidence of liver parenchymal pathology. Electronically Signed   By: Nelson Chimes M.D.   On: 12/02/2018 21:08       Assessment & Plan:    Principal Problem:   Altered mental status Active Problems:   Diabetes mellitus (HCC)   Protein-calorie malnutrition, severe (HCC)   Abnormal liver function   AMS (altered mental status)   Cough  AMS Unclear etiology, ? UTI Attempted to call Mother Leatrice Jewels, no response at [phone] Check b12, esr, ana, rpr, tsh Awaiting urinalysis ,UDS Start rocephin 1gm iv qday Consider MRI brain, EEG and neurology consult if not improving  Cough,  R/o covid  Hyperglycemia Check hga1c fsbs q4h, ISS Hydrate with ns iv  Abnormal liver function Check acute hepatitis  panel Check cmp in am  Troponin elevation Check trop I q6h x3 Check cardiac echo  Severe protein calorie malnutrition Check HIV Start prostat 30 mL po bid  Mild renal insufficiency  Hydrate with ns iv Check cmp in am  Hypertension Cont Lisinopril 45m po qday, consider alternative agent if renal function not improving  Dm2 fsbs q4h , ISS Levemir 25 units [place] qday in AM    DVT Prophylaxis-   Lovenox - SCDs  AM Labs Ordered, also please review Full Orders  Family Communication: Admission, patients condition and plan of care including tests being ordered have been discussed with the patient  who indicate understanding and agree with the plan and Code Status.  Code Status:   FULL CODE  Admission status: Observation/: Based on patients clinical presentation and evaluation of above clinical data, I have made determination that patient meets observation criteria at this time. Pt required iv insulin in ER to improve his hyperglycemia bs >300, and pt has persistent altered mental status which will require hospitalization and observation, however, depending upon results of w/up may require inpatient admission  Time spent in minutes :  70   JJani GravelM.D on 12/02/2018 at 9:40 PM

## 2018-12-02 NOTE — ED Notes (Signed)
Pharmacy state insulin and potassium may run togethor.

## 2018-12-02 NOTE — ED Notes (Signed)
ED TO INPATIENT HANDOFF REPORT  ED Nurse Name and Phone #: Thurmond Butts [phone]  S Name/Age/Gender Rodney Barber 53 y.o. male Room/Bed: 026C/026C  Code Status   Code Status: Prior  Home/SNF/Other Home Patient oriented to: self, place, time and situation Is this baseline? Yes   Triage Complete: Triage complete  Chief Complaint ams  Triage Note Pt from home via EMS, pt has been lethargic/AMS x2 days. Per brother, pt not shown up to work for several days. Brother called pt today and he was not at baseline. Brother found dark blood/tarry stool in toilet. Hx of DM and HTN and pt not taken meds for unknown time. Pt LSW possibly Thursday by brother. Pt denies pain, pt alert and oriented, but lethargic.    Allergies No Known Allergies  Level of Care/Admitting Diagnosis ED Disposition    ED Disposition Condition Comment   Admit  Hospital Area: [facility] [[id number]]  Level of Care: Telemetry Medical [104]  I expect the patient will be discharged within 24 hours: No (not a candidate for 5C-Observation unit)  Covid Evaluation: N/A  Diagnosis: AMS (altered mental status) [1914782]  Admitting Physician: Pearson Grippe [[id number]]  Attending Physician: Pearson Grippe [[id number]]  PT Class (Do Not Modify): Observation [104]  PT Acc Code (Do Not Modify): Observation [10022]       B Medical/Surgery History Past Medical History:  Diagnosis Date  . Diabetes mellitus   . Hypertension   . Pancreatitis    Past Surgical History:  Procedure Laterality Date  . ERCP  06/27/2011   Procedure: ENDOSCOPIC RETROGRADE CHOLANGIOPANCREATOGRAPHY (ERCP);  Surgeon: Petra Kuba, MD;  Location: [facility];  Service: Endoscopy;  Laterality: N/A;  . ERCP W/ PLASTIC STENT PLACEMENT  03/2011     A IV Location/Drains/Wounds Patient Lines/Drains/Airways Status   Active Line/Drains/Airways    Name:   Placement date:   Placement time:   Site:   Days:   Peripheral IV 12/02/18 Left Antecubital   12/02/18     1610    Antecubital   less than 1   Peripheral IV 12/02/18 Right Forearm   12/02/18    1957    Forearm   less than 1          Intake/Output Last 24 hours No intake or output data in the 24 hours ending 12/02/18 2105  Labs/Imaging Results for orders placed or performed during the hospital encounter of 12/02/18 (from the past 48 hour(s))  CBG monitoring, ED     Status: Abnormal   Collection Time: 12/02/18  4:28 PM  Result Value Ref Range   Glucose-Capillary 285 (H) 70 - 99 mg/dL  Lactic acid, plasma     Status: None   Collection Time: 12/02/18  4:30 PM  Result Value Ref Range   Lactic Acid, Venous 1.4 0.5 - 1.9 mmol/L    Comment: Performed at [facility], [address], [place], [place] [address]  CBC with Differential     Status: None   Collection Time: 12/02/18  4:45 PM  Result Value Ref Range   WBC 7.2 4.0 - 10.5 K/uL   RBC 4.57 4.22 - 5.81 MIL/uL   Hemoglobin 13.3 13.0 - 17.0 g/dL   HCT 30.8 65.7 - 84.6 %   MCV 90.8 80.0 - 100.0 fL   MCH 29.1 26.0 - 34.0 pg   MCHC 32.0 30.0 - 36.0 g/dL   RDW 96.2 95.2 - 84.1 %   Platelets 255 150 - 400 K/uL  nRBC 0.0 0.0 - 0.2 %   Neutrophils Relative % 78 %   Neutro Abs 5.6 1.7 - 7.7 K/uL   Lymphocytes Relative 16 %   Lymphs Abs 1.1 0.7 - 4.0 K/uL   Monocytes Relative 5 %   Monocytes Absolute 0.4 0.1 - 1.0 K/uL   Eosinophils Relative 0 %   Eosinophils Absolute 0.0 0.0 - 0.5 K/uL   Basophils Relative 0 %   Basophils Absolute 0.0 0.0 - 0.1 K/uL   Immature Granulocytes 1 %   Abs Immature Granulocytes 0.04 0.00 - 0.07 K/uL    Comment: Performed at [facility], [address], [place], [place] [address]  Comprehensive metabolic panel     Status: Abnormal   Collection Time: 12/02/18  4:45 PM  Result Value Ref Range   Sodium 139 135 - 145 mmol/L   Potassium 3.4 (L) 3.5 - 5.1 mmol/L   Chloride 98 98 - 111 mmol/L   CO2 25 22 - 32 mmol/L   Glucose, Bld 319 (H) 70 - 99 mg/dL   BUN 26 (H) 6 - 20 mg/dL   Creatinine, Ser  8.32 (H) 0.61 - 1.24 mg/dL   Calcium 8.3 (L) 8.9 - 10.3 mg/dL   Total Protein 6.0 (L) 6.5 - 8.1 g/dL   Albumin 2.9 (L) 3.5 - 5.0 g/dL   AST 549 (H) 15 - 41 U/L   ALT 79 (H) 0 - 44 U/L   Alkaline Phosphatase 134 (H) 38 - 126 U/L   Total Bilirubin 0.8 0.3 - 1.2 mg/dL   GFR calc non Af Amer 55 (L) >60 mL/min   GFR calc Af Amer >60 >60 mL/min   Anion gap 16 (H) 5 - 15    Comment: Performed at [facility], [address] [place], [place] [address]  Ammonia     Status: None   Collection Time: 12/02/18  4:45 PM  Result Value Ref Range   Ammonia 21 9 - 35 umol/L    Comment: Performed at [facility], [address] [place], [place] [address]  Lipase, blood     Status: None   Collection Time: 12/02/18  4:45 PM  Result Value Ref Range   Lipase 16 11 - 51 U/L    Comment: Performed at [facility], [address] [place], [place] [address]  Ethanol     Status: None   Collection Time: 12/02/18  4:45 PM  Result Value Ref Range   Alcohol, Ethyl (B) <10 <10 mg/dL    Comment: (NOTE) Lowest detectable limit for serum alcohol is 10 mg/dL. For medical purposes only. Performed at [facility], [address], [place], [place] [address]   Troponin I - ONCE - STAT     Status: Abnormal   Collection Time: 12/02/18  5:04 PM  Result Value Ref Range   Troponin I 0.04 (HH) <0.03 ng/mL    Comment: CRITICAL RESULT CALLED TO, READ BACK BY AND VERIFIED WITH: M.GAGE RN 1835 12/02/2018 MCCORMICK K Performed at [facility], [address], [place], [place] [address]   POCT I-Stat EG7     Status: Abnormal   Collection Time: 12/02/18  6:30 PM  Result Value Ref Range   pH, Ven 7.453 (H) 7.250 - 7.430   pCO2, Ven 37.4 (L) 44.0 - 60.0 mmHg   pO2, Ven 127.0 (H) 32.0 - 45.0 mmHg   Bicarbonate 26.1 20.0 - 28.0 mmol/L   TCO2 27 22 -  32 mmol/L   O2 Saturation 99.0 %   Acid-Base Excess 2.0 0.0 - 2.0 mmol/L   Sodium 136 135 - 145 mmol/L   Potassium 3.5 3.5 - 5.1 mmol/L    Calcium, Ion 0.96 (L) 1.15 - 1.40 mmol/L   HCT 38.0 (L) 39.0 - 52.0 %   Hemoglobin 12.9 (L) 13.0 - 17.0 g/dL   Patient temperature HIDE    Sample type VENOUS   Acetaminophen level     Status: Abnormal   Collection Time: 12/02/18  6:43 PM  Result Value Ref Range   Acetaminophen (Tylenol), Serum <10 (L) 10 - 30 ug/mL    Comment: (NOTE) Therapeutic concentrations vary significantly. A range of 10-30 ug/mL  may be an effective concentration for many patients. However, some  are best treated at concentrations outside of this range. Acetaminophen concentrations >150 ug/mL at 4 hours after ingestion  and >50 ug/mL at 12 hours after ingestion are often associated with  toxic reactions. Performed at [facility], [address], [place], [place] [address]   Salicylate level     Status: None   Collection Time: 12/02/18  6:43 PM  Result Value Ref Range   Salicylate Lvl <7.0 2.8 - 30.0 mg/dL    Comment: Performed at [facility], [address] [place], [place] [address]  CBG monitoring, ED     Status: Abnormal   Collection Time: 12/02/18  6:50 PM  Result Value Ref Range   Glucose-Capillary 282 (H) 70 - 99 mg/dL   Comment 1 Notify RN   POC occult blood, ED Provider will collect     Status: Abnormal   Collection Time: 12/02/18  7:20 PM  Result Value Ref Range   Fecal Occult Bld POSITIVE (A) NEGATIVE  CBG monitoring, ED     Status: Abnormal   Collection Time: 12/02/18  8:09 PM  Result Value Ref Range   Glucose-Capillary 235 (H) 70 - 99 mg/dL   Ct Head Wo Contrast  Result Date: 12/02/2018 CLINICAL DATA:  Lethargic and altered mental status over the last 2 days. EXAM: CT HEAD WITHOUT CONTRAST TECHNIQUE: Contiguous axial images were obtained from the base of the skull through the vertex without intravenous contrast. COMPARISON:  None. FINDINGS: Brain: The brain shows a normal appearance without evidence of malformation, atrophy, old or acute small or large vessel infarction, mass  lesion, hemorrhage, hydrocephalus or extra-axial collection. Vascular: There is mild atherosclerotic calcification of the major vessels at the base of the brain. Skull: Normal.  No traumatic finding.  No focal bone lesion. Sinuses/Orbits: Mild mucosal thickening of the right maxillary sinus. No layering fluid or advanced disease. Orbits negative. Other: None significant IMPRESSION: No acute CT finding. Normal with exception of some atherosclerotic calcification of the major vessels at the base of the brain, often seen at this age. Electronically Signed   By: Paulina Fusi M.D.   On: 12/02/2018 19:00    Pending Labs Unresulted Labs (From admission, onward)    Start     Ordered   12/02/18 1631  Rapid urine drug screen (hospital performed)  ONCE - STAT,   R     12/02/18 1630   12/02/18 1630  Urinalysis, Routine w reflex microscopic  (ED ALOC)  Once,   STAT     12/02/18 1630          Vitals/Pain Today's Vitals   12/02/18 1800 12/02/18 1830 12/02/18 1845 12/02/18 1900  BP:  (!) 159/80 (!) 155/78 132/63  Pulse:  66 70 60  Resp:  16 17 14   Temp:      TempSrc:      SpO2:  100% 100% 100%  Height:      PainSc: 0-No pain       Isolation Precautions No active isolations  Medications Medications  insulin regular, human (MYXREDLIN) 100 units/ 100 mL infusion (2.2 Units/hr Intravenous New Bag/Given 12/02/18 1904)  sodium chloride 0.9 % bolus 1,000 mL (has no administration in time range)    And  0.9 %  sodium chloride infusion (has no administration in time range)  dextrose 5 %-0.45 % sodium chloride infusion ( Intravenous New Bag/Given 12/02/18 2046)  potassium chloride 10 mEq in 100 mL IVPB (10 mEq Intravenous New Bag/Given 12/02/18 2047)  sodium chloride 0.9 % bolus 1,000 mL (1,000 mLs Intravenous New Bag/Given 12/02/18 1715)    Mobility walks     Focused Assessments aloc   R Recommendations: See Admitting Provider Note  Report given to:   Additional Notes:  Next glucose change  will be about 10:15

## 2018-12-02 NOTE — ED Notes (Signed)
Pt alert but lying with blanket over fsce. responmds no when questioned about pain but no other response.

## 2018-12-02 NOTE — ED Notes (Signed)
Dr. Adela Lank aware of troponin

## 2018-12-02 NOTE — ED Provider Notes (Signed)
[facility] EMERGENCY DEPARTMENT Provider Note   CSN: [id number] Arrival date & time: 12/02/18  1605    History   Chief Complaint Chief Complaint  Patient presents with   Altered Mental Status   Level 5 caveat due to altered mental status.  HPI Rodney Barber is a 53 y.o. male with history of diabetes mellitus, pancreatitis, protein calorie malnutrition presents brought in by EMS for evaluation of acute onset, persistent altered mental status.  Per EMS report given by the brother, the patient has been lethargic/altered for 2 days last seen normal by the brother on Thursday 4 days ago.  The brother also reports that he has not been taking his home medications and found dark tarry stools in the toilet.  On my assessment the patient is drowsy but easily arousable.  He does not answer most questions but tells me that he knows where he is.  He denies alcohol or drug use.  He denies pain, nausea, or vomiting.     The history is provided by the EMS personnel and a relative. The history is limited by the condition of the patient.    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   Diabetes ([place]) 07/18/2013   Hypokalemia 07/11/2013   Protein-calorie malnutrition, severe ([place]) 07/11/2013   Chronic pancreatitis ([place]) 07/10/2013   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 Authorizing Provider  Blood Glucose Monitoring Suppl (TRUERESULT BLOOD GLUCOSE) w/Device KIT uad 04/23/17   Hairston, Toy Baker R, FNP  glipiZIDE (GLUCOTROL) 5 MG tablet Take 1 tablet (5 mg total) by mouth 2 (two) times daily before a meal. 09/21/17   Hairston, Mandesia R, FNP  glucose blood (TRUE METRIX BLOOD GLUCOSE TEST) test strip Use as instructed 04/23/17   Alfonse Spruce, FNP  insulin detemir (LEVEMIR) 100 UNIT/ML injection Inject 0.25 mLs (25 Units total) into the skin at bedtime. 03/15/18   Fulp, Cammie, MD  lipase/protease/amylase (CREON) 12000 units CPEP capsule Take 1 capsule (12,000 Units total) by mouth 3 (three) times daily with meals. 03/15/18   Fulp, Cammie, MD  lisinopril (PRINIVIL,ZESTRIL) 5 MG tablet Take 1 tablet (5 mg total) by mouth daily. 09/21/17   Alfonse Spruce, FNP  TRUEPLUS LANCETS 28G MISC uad 04/23/17   Alfonse Spruce, FNP    Family History Family History  Problem Relation Age of Onset   Diabetes Mother    Diabetes Brother     Social History  Social History   Tobacco Use   Smoking status: Current Every Day Smoker    Packs/day: 0.50    Years: 30.00    Pack years: 15.00    Types: Cigarettes   Smokeless tobacco: Never Used   Tobacco comment: 6 cigarett /day  Substance Use Topics   Alcohol use: No   Drug use: Yes    Types: "Crack" cocaine, Other-see comments, Cocaine    Comment: last smoked crack 12-16-16     Allergies   Patient has no known allergies.   Review of Systems Review of Systems    Unable to perform ROS: Mental status change     Physical Exam Updated Vital Signs BP (!) 164/76    Pulse 77    Temp 97.7 F (36.5 C) (Oral)    Resp 14    Ht 5' 6"  (1.676 m)    SpO2 100%    BMI 18.85 kg/m   Physical Exam Vitals signs and nursing note reviewed.  Constitutional:      General: He is not in acute distress.    Appearance: He is well-developed.     Comments: Drowsy but easily arousable, resting in bed; cachectic  HENT:     Head: Normocephalic and atraumatic.     Comments: No Battle's signs, no raccoon's eyes, no rhinorrhea. No hemotympanum. No tenderness to palpation of the face or skull. No deformity, crepitus, or swelling noted.  Eyes:     General:        Right eye: No discharge.        Left eye: No discharge.     Conjunctiva/sclera: Conjunctivae normal.     Pupils: Pupils are equal, round, and reactive to light.  Neck:     Vascular: No JVD.     Trachea: No tracheal deviation.  Cardiovascular:     Rate and Rhythm: Normal rate and regular rhythm.     Pulses: Normal pulses.     Heart sounds: Normal heart sounds.  Pulmonary:     Effort: Pulmonary effort is normal.     Breath sounds: Normal breath sounds.  Abdominal:     General: Abdomen is flat. There is no distension.     Palpations: Abdomen is soft.     Tenderness: There is no abdominal tenderness. There is no guarding or rebound.  Genitourinary:    Comments: Examination performed in the presence of a chaperone.  No frank rectal bleeding.  No external hemorrhoids.  No anal fissures or tears.  Moderate amount of light brown stool in the rectal vault. Musculoskeletal:     Comments: 5/5 strength of BUE and BLE major muscle groups  Skin:    General: Skin is warm and dry.     Findings: No erythema.  Neurological:     Mental Status: He is alert.     GCS: GCS eye subscore is 3. GCS verbal subscore is 4. GCS motor subscore is 5.     Comments: Arouses to voice.  Follows some commands but not others.  Cranial  nerves appear grossly intact.  Moves extremity spontaneously with intact strength.  Sensation intact to soft touch of bilateral upper and lower extremities.  No pronator drift.  Psychiatric:        Behavior: Behavior normal.      ED Treatments / Results  Labs (all labs ordered are listed, but only abnormal results are displayed) Labs Reviewed  COMPREHENSIVE METABOLIC PANEL - Abnormal; Notable for the following components:      Result Value  Potassium 3.4 (*)    Glucose, Bld 319 (*)    BUN 26 (*)    Creatinine, Ser 1.44 (*)    Calcium 8.3 (*)    Total Protein 6.0 (*)    Albumin 2.9 (*)    AST 190 (*)    ALT 79 (*)    Alkaline Phosphatase 134 (*)    GFR calc non Af Amer 55 (*)    Anion gap 16 (*)    All other components within normal limits  TROPONIN I - Abnormal; Notable for the following components:   Troponin I 0.04 (*)    All other components within normal limits  ACETAMINOPHEN LEVEL - Abnormal; Notable for the following components:   Acetaminophen (Tylenol), Serum <10 (*)    All other components within normal limits  CBG MONITORING, ED - Abnormal; Notable for the following components:   Glucose-Capillary 285 (*)    All other components within normal limits  POC OCCULT BLOOD, ED - Abnormal; Notable for the following components:   Fecal Occult Bld POSITIVE (*)    All other components within normal limits  POCT I-STAT EG7 - Abnormal; Notable for the following components:   pH, Ven 7.453 (*)    pCO2, Ven 37.4 (*)    pO2, Ven 127.0 (*)    Calcium, Ion 0.96 (*)    HCT 38.0 (*)    Hemoglobin 12.9 (*)    All other components within normal limits  CBG MONITORING, ED - Abnormal; Notable for the following components:   Glucose-Capillary 282 (*)    All other components within normal limits  CBG MONITORING, ED - Abnormal; Notable for the following components:   Glucose-Capillary 235 (*)    All other components within normal limits  CBG MONITORING, ED - Abnormal; Notable for  the following components:   Glucose-Capillary 186 (*)    All other components within normal limits  CBG MONITORING, ED - Abnormal; Notable for the following components:   Glucose-Capillary 107 (*)    All other components within normal limits  SARS CORONAVIRUS 2 (HOSPITAL ORDER, [facility])  CBC WITH DIFFERENTIAL/PLATELET  LACTIC ACID, PLASMA  AMMONIA  LIPASE, BLOOD  ETHANOL  SALICYLATE LEVEL  URINALYSIS, ROUTINE W REFLEX MICROSCOPIC  RAPID URINE DRUG SCREEN, HOSP PERFORMED  CBC  COMPREHENSIVE METABOLIC PANEL  TROPONIN I  TROPONIN I  TROPONIN I  CK TOTAL AND CKMB (NOT AT [facility])  HIV ANTIBODY (ROUTINE TESTING W REFLEX)    EKG EKG Interpretation  Date/Time:  Monday December 02 2018 23:28:49 EDT Ventricular Rate:  67 PR Interval:    QRS Duration: 80 QT Interval:  464 QTC Calculation: 490 R Axis:   63 Text Interpretation:  Sinus rhythm Abnormal T, consider ischemia, anterior leads deep inverted t waves less impressive in v3,2 Confirmed by Deno Etienne [id number]) on 12/02/2018 11:34:16 PM   Radiology Ct Head Wo Contrast  Result Date: 12/02/2018 CLINICAL DATA:  Lethargic and altered mental status over the last 2 days. EXAM: CT HEAD WITHOUT CONTRAST TECHNIQUE: Contiguous axial images were obtained from the base of the skull through the vertex without intravenous contrast. COMPARISON:  None. FINDINGS: Brain: The brain shows a normal appearance without evidence of malformation, atrophy, old or acute small or large vessel infarction, mass lesion, hemorrhage, hydrocephalus or extra-axial collection. Vascular: There is mild atherosclerotic calcification of the major vessels at the base of the brain. Skull: Normal.  No traumatic finding.  No focal bone lesion. Sinuses/Orbits: Mild mucosal thickening  of the right maxillary sinus. No layering fluid or advanced disease. Orbits negative. Other: None significant IMPRESSION: No acute CT finding. Normal with exception of some  atherosclerotic calcification of the major vessels at the base of the brain, often seen at this age. Electronically Signed   By: Nelson Chimes M.D.   On: 12/02/2018 19:00   Dg Chest Port 1 View  Result Date: 12/02/2018 CLINICAL DATA:  Initial evaluation for acute altered mental status. EXAM: PORTABLE CHEST 1 VIEW COMPARISON:  Prior radiograph from 07/10/2013. FINDINGS: Cardiac and mediastinal silhouettes are stable in size and contour, and remain within normal limits. Aortic atherosclerosis. Lungs mildly hypoinflated. No focal infiltrates, edema, or effusion. No pneumothorax. No acute osseous finding. IMPRESSION: 1. No active cardiopulmonary disease. 2. Aortic atherosclerosis. Electronically Signed   By: Jeannine Boga M.D.   On: 12/02/2018 22:04   US Abdomen Limited Ruq  Result Date: 12/02/2018 CLINICAL DATA:  Elevated liver function tests. EXAM: ULTRASOUND ABDOMEN LIMITED RIGHT UPPER QUADRANT COMPARISON:  None. FINDINGS: Gallbladder: There is a small amount of sludge dependent in the gallbladder but no evidence of gallstones. No Murphy sign. No wall thickening or adjacent fluid. Common bile duct: Diameter: 4.5 mm.  Normal. Liver: Normal echogenicity. No focal lesion. No biliary ductal dilatation. Portal vein is patent on color Doppler imaging with normal direction of blood flow towards the liver. IMPRESSION: Normal study with exception of a very small amount of sludge in the gallbladder, of doubtful significance. There are no gallstones. There is no evidence of biliary obstruction. There is no evidence of liver parenchymal pathology. Electronically Signed   By: Nelson Chimes M.D.   On: 12/02/2018 21:08    Procedures .Critical Care Performed by: Renita Papa, PA-C Authorized by: Renita Papa, PA-C   Critical care provider statement:    Critical care time (minutes):  35   Critical care was necessary to treat or prevent imminent or life-threatening deterioration of the following conditions:   Metabolic crisis and renal failure   Critical care was time spent personally by me on the following activities:  Discussions with consultants, evaluation of patient's response to treatment, examination of patient, ordering and performing treatments and interventions, ordering and review of laboratory studies, ordering and review of radiographic studies, pulse oximetry, re-evaluation of patient's condition, obtaining history from patient or surrogate and review of old charts   (including critical care time)  Medications Ordered in ED Medications  sodium chloride 0.9 % bolus 1,000 mL (0 mLs Intravenous Stopping Infusion hung by another clincian 12/02/18 2108)    And  0.9 %  sodium chloride infusion ( Intravenous Not Given 12/02/18 2107)  dextrose 5 %-0.45 % sodium chloride infusion ( Intravenous New Bag/Given 12/02/18 2046)  potassium chloride 10 mEq in 100 mL IVPB (0 mEq Intravenous Stopped 12/02/18 2320)  feeding supplement (PRO-STAT SUGAR FREE 64) liquid 30 mL (has no administration in time range)  insulin aspart (novoLOG) injection 0-9 Units (0 Units Subcutaneous Not Given 12/02/18 2349)  sodium chloride 0.9 % bolus 1,000 mL (0 mLs Intravenous Stopped 12/02/18 2108)     Initial Impression / Assessment and Plan / ED Course  I have reviewed the triage vital signs and the nursing notes.  Pertinent labs & imaging results that were available during my care of the patient were reviewed by me and considered in my medical decision making (see chart for details).        Patient presents brought in by EMS for altered mental status.  He is afebrile, mildly hypertensive while in the ED.  I am unable to get an accurate history from the patient.  He is able to answer some yes or no questions, does not follow most commands, initial GCS of 12.  CT shows no acute intracranial abnormalities.  Lab work reviewed by me shows no leukocytosis, significant for hyperglycemia, mildly elevated anion gap of 16.   Surprisingly, however his VBG shows an alkalotic blood pH, elevated PO2 of unclear significance.  He was given IV fluids and started on insulin drip.  Does appear to have an AKI.  His ammonia is within normal limits but his LFTs are acutely elevated so we will obtain a right upper quadrant ultrasound.  An EKG was performed which shows deep T wave inversions in leads V2 and V3 so a troponin was obtained which is mildly elevated.  8:18 PM Spoke with Dr.Chakravartti with cardiology; he recommends serial troponins to trend, echo in the morning.  We will plan to hold on heparin with concern for possible GI bleed.  He has no frank rectal bleeding on examination but Hemoccult is positive and I spoke with the patient's brother on the phone who reports that he saw some dark tarry stools in the commode.  He states that the patient was in his usual state of health on Friday, but earlier today he received a phone call from another brother who reported that the patient sounded confused on the phone so he went to check up on the patient in person.  He found the patient drowsy, confused, sitting in chair.  Spoke with Dr. Maudie Mercury with [facility] hospitalist service who agrees to assume care of patient and bring him into the hospital for further evaluation and management.  Final Clinical Impressions(s) / ED Diagnoses   Final diagnoses:  LFT elevation  AKI (acute kidney injury) ([place])  Altered behavior  Elevated troponin    ED Discharge Orders    None       Renita Papa, PA-C 12/03/18 [place], [place], DO 12/03/18 1645

## 2018-12-02 NOTE — ED Triage Notes (Signed)
Pt from home via EMS, pt has been lethargic/AMS x2 days. Per brother, pt not shown up to work for several days. Brother called pt today and he was not at baseline. Brother found dark blood/tarry stool in toilet. Hx of DM and HTN and pt not taken meds for unknown time. Pt LSW possibly Thursday by brother. Pt denies pain, pt alert and oriented, but lethargic.

## 2018-12-03 ENCOUNTER — Observation Stay (HOSPITAL_COMMUNITY): Payer: Self-pay

## 2018-12-03 DIAGNOSIS — N179 Acute kidney failure, unspecified: Secondary | ICD-10-CM

## 2018-12-03 DIAGNOSIS — R4689 Other symptoms and signs involving appearance and behavior: Secondary | ICD-10-CM

## 2018-12-03 DIAGNOSIS — R7989 Other specified abnormal findings of blood chemistry: Secondary | ICD-10-CM

## 2018-12-03 LAB — URINALYSIS, ROUTINE W REFLEX MICROSCOPIC
Bacteria, UA: NONE SEEN
Bilirubin Urine: NEGATIVE
Glucose, UA: >=500 mg/dL — AB
Ketones, ur: 20 mg/dL — AB
Nitrite: POSITIVE — AB
Protein, ur: 100 mg/dL — AB
RBC / HPF: >50 RBC/hpf — ABNORMAL HIGH (ref 0–5)
Specific Gravity, Urine: 1.016 (ref 1.005–1.030)
WBC, UA: >50 WBC/hpf — ABNORMAL HIGH (ref 0–5)
pH: 5 (ref 5.0–8.0)

## 2018-12-03 LAB — FERRITIN: Ferritin: 269 ng/mL (ref 24–336)

## 2018-12-03 LAB — CK TOTAL AND CKMB (NOT AT ARMC)
CK, MB: 127.6 ng/mL — ABNORMAL HIGH (ref 0.5–5.0)
Relative Index: 14.9 — ABNORMAL HIGH (ref 0.0–2.5)
Total CK: 856 U/L — ABNORMAL HIGH (ref 49–397)

## 2018-12-03 LAB — RAPID URINE DRUG SCREEN, HOSP PERFORMED
Amphetamines: NOT DETECTED
Barbiturates: NOT DETECTED
Benzodiazepines: NOT DETECTED
Cocaine: POSITIVE — AB
Opiates: NOT DETECTED
Tetrahydrocannabinol: NOT DETECTED

## 2018-12-03 LAB — GLUCOSE, CAPILLARY
Glucose-Capillary: 92 mg/dL (ref 70–99)
Glucose-Capillary: 125 mg/dL — ABNORMAL HIGH (ref 70–99)
Glucose-Capillary: 138 mg/dL — ABNORMAL HIGH (ref 70–99)
Glucose-Capillary: 176 mg/dL — ABNORMAL HIGH (ref 70–99)
Glucose-Capillary: 138 mg/dL — ABNORMAL HIGH (ref 70–99)
Glucose-Capillary: 215 mg/dL — ABNORMAL HIGH (ref 70–99)
Glucose-Capillary: 183 mg/dL — ABNORMAL HIGH (ref 70–99)
Glucose-Capillary: 223 mg/dL — ABNORMAL HIGH (ref 70–99)

## 2018-12-03 LAB — COMPREHENSIVE METABOLIC PANEL
ALT: 71 U/L — ABNORMAL HIGH (ref 0–44)
AST: 153 U/L — ABNORMAL HIGH (ref 15–41)
Albumin: 2.8 g/dL — ABNORMAL LOW (ref 3.5–5.0)
Alkaline Phosphatase: 124 U/L (ref 38–126)
Anion gap: 12 (ref 5–15)
BUN: 20 mg/dL (ref 6–20)
CO2: 23 mmol/L (ref 22–32)
Calcium: 8 mg/dL — ABNORMAL LOW (ref 8.9–10.3)
Chloride: 106 mmol/L (ref 98–111)
Creatinine, Ser: 1.08 mg/dL (ref 0.61–1.24)
GFR calc Af Amer: >60 mL/min (ref >60)
GFR calc non Af Amer: >60 mL/min (ref >60)
Glucose, Bld: 100 mg/dL — ABNORMAL HIGH (ref 70–99)
Potassium: 3.6 mmol/L (ref 3.5–5.1)
Sodium: 141 mmol/L (ref 135–145)
Total Bilirubin: 0.6 mg/dL (ref 0.3–1.2)
Total Protein: 5.7 g/dL — ABNORMAL LOW (ref 6.5–8.1)

## 2018-12-03 LAB — CBC
HCT: 38.2 % — ABNORMAL LOW (ref 39.0–52.0)
Hemoglobin: 12.5 g/dL — ABNORMAL LOW (ref 13.0–17.0)
MCH: 29.3 pg (ref 26.0–34.0)
MCHC: 32.7 g/dL (ref 30.0–36.0)
MCV: 89.5 fL (ref 80.0–100.0)
Platelets: 262 10*3/uL (ref 150–400)
RBC: 4.27 MIL/uL (ref 4.22–5.81)
RDW: 13 % (ref 11.5–15.5)
WBC: 7.3 10*3/uL (ref 4.0–10.5)
nRBC: 0 % (ref 0.0–0.2)

## 2018-12-03 LAB — ECHOCARDIOGRAM COMPLETE: Height: 66 in

## 2018-12-03 LAB — TROPONIN I
Troponin I: 0.04 ng/mL (ref <0.03)
Troponin I: 0.05 ng/mL (ref <0.03)
Troponin I: 0.05 ng/mL (ref <0.03)

## 2018-12-03 LAB — C-REACTIVE PROTEIN: CRP: <0.8 mg/dL (ref <1.0)

## 2018-12-03 LAB — CBG MONITORING, ED: Glucose-Capillary: 90 mg/dL (ref 70–99)

## 2018-12-03 LAB — ABO/RH: ABO/RH(D): O POS

## 2018-12-03 LAB — HIV ANTIBODY (ROUTINE TESTING W REFLEX): HIV Screen 4th Generation wRfx: NONREACTIVE

## 2018-12-03 LAB — SARS CORONAVIRUS 2 BY RT PCR (HOSPITAL ORDER, PERFORMED IN ~~LOC~~ HOSPITAL LAB): SARS Coronavirus 2: NEGATIVE

## 2018-12-03 LAB — D-DIMER, QUANTITATIVE: D-Dimer, Quant: 0.76 ug/mL-FEU — ABNORMAL HIGH (ref 0.00–0.50)

## 2018-12-03 MED ORDER — SODIUM CHLORIDE 0.9 % IV SOLN
INTRAVENOUS | Status: AC
  Administered 2018-12-03: 04:00:00 via INTRAVENOUS

## 2018-12-03 MED ORDER — SODIUM CHLORIDE 0.9 % IV SOLN
1.0000 g | Freq: Every day | INTRAVENOUS | Status: DC
  Administered 2018-12-03 (×2): 1 g via INTRAVENOUS
  Filled 2018-12-03 (×2): qty 10

## 2018-12-03 MED ORDER — PANCRELIPASE (LIP-PROT-AMYL) 12000-38000 UNITS PO CPEP
12000.0000 [IU] | ORAL_CAPSULE | Freq: Three times a day (TID) | ORAL | Status: DC
  Administered 2018-12-03 – 2018-12-04 (×5): 12000 [IU] via ORAL
  Filled 2018-12-03 (×5): qty 1

## 2018-12-03 MED ORDER — INSULIN DETEMIR 100 UNIT/ML ~~LOC~~ SOLN
25.0000 [IU] | Freq: Every day | SUBCUTANEOUS | Status: DC
  Administered 2018-12-03 – 2018-12-04 (×2): 25 [IU] via SUBCUTANEOUS
  Filled 2018-12-03 (×3): qty 0.25

## 2018-12-03 MED ORDER — AMLODIPINE BESYLATE 5 MG PO TABS
5.0000 mg | ORAL_TABLET | Freq: Every day | ORAL | Status: DC
  Administered 2018-12-03 – 2018-12-04 (×2): 5 mg via ORAL
  Filled 2018-12-03 (×2): qty 1

## 2018-12-03 MED ORDER — ENOXAPARIN SODIUM 40 MG/0.4ML ~~LOC~~ SOLN
40.0000 mg | SUBCUTANEOUS | Status: DC
  Administered 2018-12-03 – 2018-12-04 (×2): 40 mg via SUBCUTANEOUS
  Filled 2018-12-03 (×2): qty 0.4

## 2018-12-03 MED ORDER — LISINOPRIL 5 MG PO TABS
5.0000 mg | ORAL_TABLET | Freq: Every day | ORAL | Status: DC
  Administered 2018-12-03 – 2018-12-04 (×2): 5 mg via ORAL
  Filled 2018-12-03 (×2): qty 1

## 2018-12-03 NOTE — Evaluation (Signed)
Physical Therapy Evaluation Patient Details Name: Rodney Barber MRN: [id number] DOB: Sep 11, 1965 Today's Date: 12/03/2018   History of Present Illness  53 y.o. male, w dm2, hypertension, h/o pancreatitis, presents to ED with AMS and hyperglycemia. Found to have elevated troponins, abnormal liver function, + for cocaine. No acute findings on CT scan.   Clinical Impression  Pt has limited short term recall, doesn't know what has happened, and can't figure out where he is, however is able to report that he lives in a single story home with 4 steps to enter and was working at [employer]. Pt is limited in safe mobility by decreased strength and stability in the presence of decreased cognition. Pt is mod I for bed mobility min guard for transfers and minA for ambulation to bathroom. Pt noted to have blood in his urine and RN notified. PT recommending HHPT level rehab at discharge to improve safe mobility in his home environment. PT will continue to follow acutely.    Follow Up Recommendations Home health PT;Supervision for mobility/OOB    Equipment Recommendations  Rolling walker with 5" wheels    Recommendations for Other Services       Precautions / Restrictions Precautions Precautions: None Restrictions Weight Bearing Restrictions: No      Mobility  Bed Mobility Overal bed mobility: Modified Independent             General bed mobility comments: increased time and effort to come to EoB  Transfers Overall transfer level: Needs assistance Equipment used: None Transfers: Sit to/from Stand Sit to Stand: Min guard         General transfer comment: min guard for safety, increased effort  Ambulation/Gait Ambulation/Gait assistance: Min assist Gait Distance (Feet): 20 Feet Assistive device: IV Pole Gait Pattern/deviations: Step-through pattern;Decreased step length - right;Decreased step length - left;Trunk flexed;Drifts right/left Gait velocity: slowed Gait velocity  interpretation: <1.8 ft/sec, indicate of risk for recurrent falls General Gait Details: minA for steadying with gait, pt reports not using an AD but reaches to IV pole for support for ambulation to bathroom, no overt LoB with gait, but has staggering steps to L and R        Balance Overall balance assessment: Needs assistance Sitting-balance support: Feet supported;No upper extremity supported Sitting balance-Leahy Scale: Good     Standing balance support: During functional activity;No upper extremity supported Standing balance-Leahy Scale: Fair Standing balance comment: requires UE support for ambulation                              Pertinent Vitals/Pain Pain Assessment: Faces Faces Pain Scale: Hurts a little bit Pain Location: generalized with movement Pain Descriptors / Indicators: Grimacing;Guarding Pain Intervention(s): Limited activity within patient's tolerance;Monitored during session;Repositioned    Home Living Family/patient expects to be discharged to:: Private residence Living Arrangements: Alone Available Help at Discharge: Family;Available PRN/intermittently(brother) Type of Home: House Home Access: Stairs to enter Entrance Stairs-Rails: None Entrance Stairs-Number of Steps: 4 Home Layout: One level Home Equipment: None      Prior Function Level of Independence: Independent         Comments: working at the [employer]   Dominant Hand: Left    Extremity/Trunk Assessment   Upper Extremity Assessment Upper Extremity Assessment: Generalized weakness    Lower Extremity Assessment Lower Extremity Assessment: Generalized weakness       Communication   Communication: No difficulties  Cognition Arousal/Alertness:  Lethargic Behavior During Therapy: Flat affect Overall Cognitive Status: Impaired/Different from baseline Area of Impairment: Orientation;Following commands;Problem solving;Safety/judgement;Memory;Attention                  Orientation Level: Disoriented to;Time;Place;Situation Current Attention Level: Selective Memory: Decreased short-term memory Following Commands: Follows multi-step commands inconsistently;Follows multi-step commands with increased time Safety/Judgement: Decreased awareness of deficits   Problem Solving: Slow processing;Requires verbal cues General Comments: Disorented to situation, with sitting EoB becomes tearful asking "why did they do this to me." unable to elaborate       General Comments General comments (skin integrity, edema, etc.): Pt noted to have blood in urine, did not flush and alerted RN.         Assessment/Plan    PT Assessment Patient needs continued PT services  PT Problem List Decreased strength;Decreased activity tolerance;Decreased balance;Decreased mobility;Decreased safety awareness;Decreased knowledge of precautions;Decreased cognition;Decreased coordination       PT Treatment Interventions DME instruction;Gait training;Functional mobility training;Therapeutic activities;Therapeutic exercise;Balance training;Cognitive remediation;Patient/family education    PT Goals (Current goals can be found in the Care Plan section)  Acute Rehab PT Goals Patient Stated Goal: go home PT Goal Formulation: With patient Time For Goal Achievement: 12/17/18 Potential to Achieve Goals: Fair    Frequency Min 3X/week   Barriers to discharge Decreased caregiver support         AM-PAC PT "6 Clicks" Mobility  Outcome Measure Help needed turning from your back to your side while in a flat bed without using bedrails?: None Help needed moving from lying on your back to sitting on the side of a flat bed without using bedrails?: None Help needed moving to and from a bed to a chair (including a wheelchair)?: A Little Help needed standing up from a chair using your arms (e.g., wheelchair or bedside chair)?: A Little Help needed to walk in hospital room?: A  Little Help needed climbing 3-5 steps with a railing? : A Lot 6 Click Score: 19    End of Session Equipment Utilized During Treatment: Gait belt Activity Tolerance: Patient tolerated treatment well Patient left: in bed;with call bell/phone within reach;with bed alarm set Nurse Communication: Mobility status;Other (comment)(Blood in urine) PT Visit Diagnosis: Unsteadiness on feet (R26.81);Other abnormalities of gait and mobility (R26.89);Muscle weakness (generalized) (M62.81);Difficulty in walking, not elsewhere classified (R26.2)    Time: 1203-1226 PT Time Calculation (min) (ACUTE ONLY): 23 min   Charges:   PT Evaluation $PT Eval Moderate Complexity: 1 Mod PT Treatments $Gait Training: 8-22 mins        Marycatherine Maniscalco B. Beverely Risen PT, DPT Acute Rehabilitation Services Pager [phone] Office [phone]   [place] [facility] 12/03/2018, 12:58 PM

## 2018-12-03 NOTE — Progress Notes (Signed)
PROGRESS NOTE    Rodney Barber  ZJQ:[id number] DOB: March 30, 1966 DOA: 12/02/2018 PCP: Gildardo Pounds, NP    Brief Narrative:53 y.o. male, w dm2, hypertension, h/o pancreatitis, apparently presents with AMS and hyperglycemia. Pt appears somewhat confused and can't provide good history.   In ED,  T 97.7  P 60 R 14, Bp 132/63 pox 100%  Trop 0.04 Glucose 319  Wbc 7.2, Hgb 13.3, Plt 255 Na 139, K 3.4,  Bun 26, Creatinine 1.44 Calclium 8.3,  Alb 2.9 Ast 190, Alt 79, Alk phos 134, T. Bili 0.8 Ammonia 21 Lipase 16 ETOH <24 Tylenol <09 Salicylate level <7  Abg ph 7.453, pco2 37.4, Po2 127  CT brain IMPRESSION: No acute CT finding. Normal with exception of some atherosclerotic calcification of the major vessels at the base of the brain, often seen at this age.  RUQ ultrasound IMPRESSION: Normal study with exception of a very small amount of sludge in the gallbladder, of doubtful significance. There are no gallstones. There is no evidence of biliary obstruction. There is no evidence of liver parenchymal pathology.   Urine drug screen pending Urinalysis pending  Pt will be admitted for AMS and hyperglycemia, and abnormal liver function.     Assessment & Plan:   Principal Problem:   Altered mental status Active Problems:   Diabetes mellitus (HCC)   Protein-calorie malnutrition, severe (HCC)   Abnormal liver function   AMS (altered mental status)   Cough   #1 acute change in mental status I am unsure of his baseline attempted to call his mom very tired [place] with no response.  I do not have any other phone numbers.  However his urine drug screen is positive for cocaine.  His UA does appear he probably could have a UTI.  Continue Rocephin.  Follow-up blood cultures.  When I saw him this morning he was sleeping with sheet over his face he opened his eyes looked at me but it took a long time for him to answer any questions.  He did not have any cough during  the time that I was in the room.  Continue IV fluids.  Patient is not eating much. #2 diabetes type 2 with hyperglycemia continue Levemir insulin.  #3 abnormal LFTs with elevated AST more than ALT  #4 elevated troponin with urine drug screen positive for cocaine with no acute EKG changes echo done follow-up  #5 mild AKI continue IV fluids.  #6 hypertension continue lisinopril.  Vascular blood pressure still elevated avoid beta-blockers with cocaine on board.      Estimated body mass index is 18.85 kg/m as calculated from the following:   Height as of this encounter: 5' 6" (1.676 m).   Weight as of 03/15/18: 53 kg.  DVT prophylaxis: Lovenox  code Status: Full code  family Communication attempted to call mother with no response Disposition Plan: Pending clinical improvement  Consultants:   None  Procedures: None Antimicrobials: Rocephin Subjective: Sleeping in bed with sheets covering head when I called his name he tried to wake up but still kept his eyes closed and trying to speak moves all extremities when I asked him do you know where you are he did not reply anything.  Denies any pain.  Objective: Vitals:   12/03/18 0201 12/03/18 0223 12/03/18 0429 12/03/18 0741  BP: (!) 162/73 (!) 167/77 (!) 144/84 (!) 150/78  Pulse: (!) 56 (!) 56 65 60  Resp: _0 Temp:  97.8 F (36.6 C) 97.8  F (36.6 C) 97.8 F (36.6 C)  TempSrc:  Oral Oral Oral  SpO2: 100% 100% 100% 100%  Height:        Intake/Output Summary (Last 24 hours) at 12/03/2018 0957 Last data filed at 12/03/2018 0540 Gross per 24 hour  Intake 1702.09 ml  Output 700 ml  Net 1002.09 ml   There were no vitals filed for this visit.  Examination:  General exam: Appears calm and comfortable  Respiratory system: Clear to auscultation. Respiratory effort normal. Cardiovascular system: S1 & S2 heard, RRR. No JVD, murmurs, rubs, gallops or clicks. No pedal edema. Gastrointestinal system: Abdomen is  nondistended, soft and nontender. No organomegaly or masses felt. Normal bowel sounds heard. Central nervous system: Moves all extremities Extremities: Symmetric 5 x 5 power. Skin: No rashes, lesions or ulcers  Data Reviewed: I have personally reviewed following labs and imaging studies  CBC: Recent Labs  Lab 12/02/18 1645 12/02/18 1830 12/03/18 0201  WBC 7.2  --  7.3  NEUTROABS 5.6  --   --   HGB 13.3 12.9* 12.5*  HCT 41.5 38.0* 38.2*  MCV 90.8  --  89.5  PLT 255  --  599   Basic Metabolic Panel: Recent Labs  Lab 12/02/18 1645 12/02/18 1830 12/03/18 0201  NA 139 136 141  K 3.4* 3.5 3.6  CL 98  --  106  CO2 25  --  23  GLUCOSE 319*  --  100*  BUN 26*  --  20  CREATININE 1.44*  --  1.08  CALCIUM 8.3*  --  8.0*   GFR: CrCl cannot be calculated (Unknown ideal weight.). Liver Function Tests: Recent Labs  Lab 12/02/18 1645 12/03/18 0201  AST 190* 153*  ALT 79* 71*  ALKPHOS 134* 124  BILITOT 0.8 0.6  PROT 6.0* 5.7*  ALBUMIN 2.9* 2.8*   Recent Labs  Lab 12/02/18 1645  LIPASE 16   Recent Labs  Lab 12/02/18 1645  AMMONIA 21   Coagulation Profile: No results for input(s): INR, PROTIME in the last 168 hours. Cardiac Enzymes: Recent Labs  Lab 12/02/18 1704 12/02/18 2335 12/03/18 0201  CKTOTAL  --  856*  --   CKMB  --  127.6*  --   TROPONINI 0.04* 0.04* 0.05*   BNP (last 3 results) No results for input(s): PROBNP in the last 8760 hours. HbA1C: No results for input(s): HGBA1C in the last 72 hours. CBG: Recent Labs  Lab 12/02/18 2114 12/02/18 2240 12/03/18 0124 12/03/18 0221 12/03/18 0623  GLUCAP 186* 107* 90 92 125*   Lipid Profile: No results for input(s): CHOL, HDL, LDLCALC, TRIG, CHOLHDL, LDLDIRECT in the last 72 hours. Thyroid Function Tests: No results for input(s): TSH, T4TOTAL, FREET4, T3FREE, THYROIDAB in the last 72 hours. Anemia Panel: Recent Labs    12/03/18 0348  FERRITIN 269   Sepsis Labs: Recent Labs  Lab 12/02/18 1630   LATICACIDVEN 1.4    Recent Results (from the past 240 hour(s))  SARS Coronavirus 2 [facility] order, Performed in [facility])     Status: None   Collection Time: 12/02/18 11:47 PM  Result Value Ref Range Status   SARS Coronavirus 2 NEGATIVE NEGATIVE Final    Comment: (NOTE) If result is NEGATIVE SARS-CoV-2 target nucleic acids are NOT DETECTED. The SARS-CoV-2 RNA is generally detectable in upper and lower  respiratory specimens during the acute phase of infection. The lowest  concentration of SARS-CoV-2 viral copies this assay can detect is 250  copies / mL.  A negative result does not preclude SARS-CoV-2 infection  and should not be used as the sole basis for treatment or other  patient management decisions.  A negative result may occur with  improper specimen collection / handling, submission of specimen other  than nasopharyngeal swab, presence of viral mutation(s) within the  areas targeted by this assay, and inadequate number of viral copies  (<250 copies / mL). A negative result must be combined with clinical  observations, patient history, and epidemiological information. If result is POSITIVE SARS-CoV-2 target nucleic acids are DETECTED. The SARS-CoV-2 RNA is generally detectable in upper and lower  respiratory specimens dur ing the acute phase of infection.  Positive  results are indicative of active infection with SARS-CoV-2.  Clinical  correlation with patient history and other diagnostic information is  necessary to determine patient infection status.  Positive results do  not rule out bacterial infection or co-infection with other viruses. If result is PRESUMPTIVE POSTIVE SARS-CoV-2 nucleic acids MAY BE PRESENT.   A presumptive positive result was obtained on the submitted specimen  and confirmed on repeat testing.  While 2019 novel coronavirus  (SARS-CoV-2) nucleic acids may be present in the submitted sample  additional confirmatory testing may be  necessary for epidemiological  and / or clinical management purposes  to differentiate between  SARS-CoV-2 and other Sarbecovirus currently known to infect humans.  If clinically indicated additional testing with an alternate test  methodology ([id number]) is advised. The SARS-CoV-2 RNA is generally  detectable in upper and lower respiratory sp ecimens during the acute  phase of infection. The expected result is Negative. Fact Sheet for Patients:  https://www.fda.gov/media/136312/download Fact Sheet for Healthcare Providers: https://www.fda.gov/media/136313/download This test is not yet approved or cleared by the [place] FDA and has been authorized for detection and/or diagnosis of SARS-CoV-2 by FDA under an Emergency Use Authorization (EUA).  This EUA will remain in effect (meaning this test can be used) for the duration of the COVID-19 declaration under Section 564(b)(1) of the Act, 21 U.S.C. section 360bbb-3(b)(1), unless the authorization is terminated or revoked sooner. Performed at [facility], [address] [place], [place] [address]          Radiology Studies: Ct Head Wo Contrast  Result Date: 12/02/2018 CLINICAL DATA:  Lethargic and altered mental status over the last 2 days. EXAM: CT HEAD WITHOUT CONTRAST TECHNIQUE: Contiguous axial images were obtained from the base of the skull through the vertex without intravenous contrast. COMPARISON:  None. FINDINGS: Brain: The brain shows a normal appearance without evidence of malformation, atrophy, old or acute small or large vessel infarction, mass lesion, hemorrhage, hydrocephalus or extra-axial collection. Vascular: There is mild atherosclerotic calcification of the major vessels at the base of the brain. Skull: Normal.  No traumatic finding.  No focal bone lesion. Sinuses/Orbits: Mild mucosal thickening of the right maxillary sinus. No layering fluid or advanced disease. Orbits negative. Other: None significant  IMPRESSION: No acute CT finding. Normal with exception of some atherosclerotic calcification of the major vessels at the base of the brain, often seen at this age. Electronically Signed   By: Mark  Shogry M.D.   On: 12/02/2018 19:00   Dg Chest Port 1 View  Result Date: 12/02/2018 CLINICAL DATA:  Initial evaluation for acute altered mental status. EXAM: PORTABLE CHEST 1 VIEW COMPARISON:  Prior radiograph from 07/10/2013. FINDINGS: Cardiac and mediastinal silhouettes are stable in size and contour, and remain within normal limits. Aortic atherosclerosis. Lungs mildly hypoinflated. No   focal infiltrates, edema, or effusion. No pneumothorax. No acute osseous finding. IMPRESSION: 1. No active cardiopulmonary disease. 2. Aortic atherosclerosis. Electronically Signed   By: Benjamin  McClintock M.D.   On: 12/02/2018 22:04   Us Abdomen Limited Ruq  Result Date: 12/02/2018 CLINICAL DATA:  Elevated liver function tests. EXAM: ULTRASOUND ABDOMEN LIMITED RIGHT UPPER QUADRANT COMPARISON:  None. FINDINGS: Gallbladder: There is a small amount of sludge dependent in the gallbladder but no evidence of gallstones. No Murphy sign. No wall thickening or adjacent fluid. Common bile duct: Diameter: 4.5 mm.  Normal. Liver: Normal echogenicity. No focal lesion. No biliary ductal dilatation. Portal vein is patent on color Doppler imaging with normal direction of blood flow towards the liver. IMPRESSION: Normal study with exception of a very small amount of sludge in the gallbladder, of doubtful significance. There are no gallstones. There is no evidence of biliary obstruction. There is no evidence of liver parenchymal pathology. Electronically Signed   By: Mark  Shogry M.D.   On: 12/02/2018 21:08        Scheduled Meds: . enoxaparin (LOVENOX) injection  40 mg Subcutaneous Q24H  . feeding supplement (PRO-STAT SUGAR FREE 64)  30 mL Oral BID  . insulin aspart  0-9 Units Subcutaneous Q4H  . insulin detemir  25 Units Subcutaneous  Daily  . lipase/protease/amylase  12,000 Units Oral TID WC  . lisinopril  5 mg Oral Daily   Continuous Infusions: . sodium chloride Stopped (12/02/18 2108)   And  . sodium chloride    . sodium chloride 75 mL/hr at 12/03/18 0344  . cefTRIAXone (ROCEPHIN)  IV 1 g (12/03/18 0126)     LOS: 0 days    Elizabeth G Mathews, MD [facility]  If 7PM-7AM, please contact night-coverage [email or web address] Password [email or web address] 12/03/2018, 9:57 AM   

## 2018-12-03 NOTE — Progress Notes (Signed)
Echocardiogram 2D Echocardiogram has been performed.  Pieter Partridge 12/03/2018, 9:10 AM

## 2018-12-03 NOTE — Progress Notes (Signed)
Received a call from patients brother, Warnie Aspinall. The only contact listed for the patient was Cambell Yeakey, whom Trey Paula advised passed away 2018-12-13. I spoke to the patient who gave permission for me to speak with Trey Paula and add him as a contact. Trey Paula also advised that another brother passed away Dec 07, 2018. The patient lived in the home with the two deceased and Trey Paula is concerned that the patient may also be depressed from these two recent deaths in the family. I advised him the patient is calm and cooperative, still resting quite a bit. He is eating well and doing good.

## 2018-12-03 NOTE — TOC Initial Note (Addendum)
Transition of Care [facility]) - Initial/Assessment Note    Patient Details  Name: Rodney Barber MRN: [id number] Date of Birth: 09/18/1965  [facility] CM/SW Contact:    Kermit Balo, RN Phone Number: 12/03/2018, 1:46 PM  Clinical Narrative:                 Pt denies issues with medications at home. He states he drives. Pt has a brother that can check in on him but no 24 hour supervision. Pt is active with [facility] for PCP and medication assistance.  Expected Discharge Plan: Home w Home Health Services Barriers to Discharge: Continued Medical Work up   Patient Goals and CMS Choice        Expected Discharge Plan and Services Expected Discharge Plan: Home w Home Health Services       Living arrangements for the past 2 months: Single Family Home(one level)                                      Prior Living Arrangements/Services Living arrangements for the past 2 months: Single Family Home(one level) Lives with:: Self Patient language and need for interpreter reviewed:: Yes(no needs) Do you feel safe going back to the place where you live?: Yes        Care giver support system in place?: No (comment) Current home services: DME(cane) Criminal Activity/Legal Involvement Pertinent to Current Situation/Hospitalization: No - Comment as needed  Activities of Daily Living      Permission Sought/Granted                  Emotional Assessment Appearance:: Appears stated age Attitude/Demeanor/Rapport: Other (comment)(talked with CM with blanket over his head) Affect (typically observed): Accepting Orientation: : Oriented to Self   Psych Involvement: No (comment)  Admission diagnosis:  Elevated troponin [R79.89] AKI (acute kidney injury) (HCC) [N17.9] LFT elevation [R94.5] Altered behavior [R46.89] AMS (altered mental status) [R41.82] Altered mental status [R41.82] Patient Active Problem List   Diagnosis Date Noted  . AKI (acute kidney injury)  (HCC)   . Altered behavior   . Altered mental status 12/02/2018  . Abnormal liver function 12/02/2018  . AMS (altered mental status) 12/02/2018  . Cough 12/02/2018  . Uncontrolled type 2 diabetes mellitus (HCC) 03/15/2018  . Urine test positive for microalbuminuria 07/12/2017  . DM (diabetes mellitus) type 2, uncontrolled, with ketoacidosis (HCC) 09/08/2013  . Liver lesion 07/22/2013  . 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 Diabetes (HCC) 07/18/2013  . Hypokalemia 07/11/2013  . Protein-calorie malnutrition, severe (HCC) 07/11/2013  . Chronic pancreatitis (HCC) 07/10/2013  . 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 Diabetes mellitus (HCC) 07/10/2013  . Weight loss 07/10/2013  . Diarrhea 07/10/2013  . Liver lesion, right lobe 07/10/2013   PCP:  [name], NP Pharmacy:   [facility] [place], [place] - [place] [address] [place] [address] Phone: [phone] Fax: [phone]     Social Determinants of Health (SDOH) Interventions    Readmission Risk Interventions No flowsheet data found.

## 2018-12-03 NOTE — Progress Notes (Signed)
Arrived from ED. Alert and oriented to person and place. Denies any pain. Wanted to go to sleep, doesn't want to answer much questions. Tele in place.

## 2018-12-04 ENCOUNTER — Encounter (HOSPITAL_COMMUNITY): Payer: Self-pay | Admitting: Emergency Medicine

## 2018-12-04 ENCOUNTER — Emergency Department (HOSPITAL_COMMUNITY)
Admission: EM | Admit: 2018-12-04 | Discharge: 2018-12-04 | Disposition: A | Discharge status: Home / Self Care | Payer: Self-pay | Attending: Emergency Medicine | Admitting: Emergency Medicine

## 2018-12-04 ENCOUNTER — Other Ambulatory Visit: Payer: Self-pay

## 2018-12-04 DIAGNOSIS — I10 Essential (primary) hypertension: Secondary | ICD-10-CM | POA: Insufficient documentation

## 2018-12-04 DIAGNOSIS — Z794 Long term (current) use of insulin: Secondary | ICD-10-CM | POA: Insufficient documentation

## 2018-12-04 DIAGNOSIS — Z79899 Other long term (current) drug therapy: Secondary | ICD-10-CM | POA: Insufficient documentation

## 2018-12-04 DIAGNOSIS — E162 Hypoglycemia, unspecified: Secondary | ICD-10-CM

## 2018-12-04 DIAGNOSIS — F17210 Nicotine dependence, cigarettes, uncomplicated: Secondary | ICD-10-CM | POA: Insufficient documentation

## 2018-12-04 DIAGNOSIS — E11649 Type 2 diabetes mellitus with hypoglycemia without coma: Secondary | ICD-10-CM | POA: Insufficient documentation

## 2018-12-04 LAB — CBC WITH DIFFERENTIAL/PLATELET
Abs Immature Granulocytes: 0.02 10*3/uL (ref 0.00–0.07)
Abs Immature Granulocytes: 0.03 10*3/uL (ref 0.00–0.07)
Basophils Absolute: 0 10*3/uL (ref 0.0–0.1)
Basophils Absolute: 0 10*3/uL (ref 0.0–0.1)
Basophils Relative: 1 %
Basophils Relative: 0 %
Eosinophils Absolute: 0 10*3/uL (ref 0.0–0.5)
Eosinophils Absolute: 0 10*3/uL (ref 0.0–0.5)
Eosinophils Relative: 1 %
Eosinophils Relative: 0 %
HCT: 32.1 % — ABNORMAL LOW (ref 39.0–52.0)
HCT: 41.2 % (ref 39.0–52.0)
Hemoglobin: 10.3 g/dL — ABNORMAL LOW (ref 13.0–17.0)
Hemoglobin: 13 g/dL (ref 13.0–17.0)
Immature Granulocytes: 0 %
Immature Granulocytes: 0 %
Lymphocytes Relative: 36 %
Lymphocytes Relative: 16 %
Lymphs Abs: 2.3 10*3/uL (ref 0.7–4.0)
Lymphs Abs: 1.2 10*3/uL (ref 0.7–4.0)
MCH: 29.1 pg (ref 26.0–34.0)
MCH: 29 pg (ref 26.0–34.0)
MCHC: 32.1 g/dL (ref 30.0–36.0)
MCHC: 31.6 g/dL (ref 30.0–36.0)
MCV: 90.7 fL (ref 80.0–100.0)
MCV: 92 fL (ref 80.0–100.0)
Monocytes Absolute: 0.5 10*3/uL (ref 0.1–1.0)
Monocytes Absolute: 0.4 10*3/uL (ref 0.1–1.0)
Monocytes Relative: 7 %
Monocytes Relative: 5 %
Neutro Abs: 3.7 10*3/uL (ref 1.7–7.7)
Neutro Abs: 6.1 10*3/uL (ref 1.7–7.7)
Neutrophils Relative %: 55 %
Neutrophils Relative %: 79 %
Platelets: 224 10*3/uL (ref 150–400)
Platelets: 231 10*3/uL (ref 150–400)
RBC: 3.54 MIL/uL — ABNORMAL LOW (ref 4.22–5.81)
RBC: 4.48 MIL/uL (ref 4.22–5.81)
RDW: 13.2 % (ref 11.5–15.5)
RDW: 13 % (ref 11.5–15.5)
WBC: 6.5 10*3/uL (ref 4.0–10.5)
WBC: 7.7 10*3/uL (ref 4.0–10.5)
nRBC: 0 % (ref 0.0–0.2)
nRBC: 0 % (ref 0.0–0.2)

## 2018-12-04 LAB — COMPREHENSIVE METABOLIC PANEL
ALT: 54 U/L — ABNORMAL HIGH (ref 0–44)
ALT: 74 U/L — ABNORMAL HIGH (ref 0–44)
AST: 97 U/L — ABNORMAL HIGH (ref 15–41)
AST: 139 U/L — ABNORMAL HIGH (ref 15–41)
Albumin: 2.3 g/dL — ABNORMAL LOW (ref 3.5–5.0)
Albumin: 3.1 g/dL — ABNORMAL LOW (ref 3.5–5.0)
Alkaline Phosphatase: 99 U/L (ref 38–126)
Alkaline Phosphatase: 124 U/L (ref 38–126)
Anion gap: 6 (ref 5–15)
Anion gap: 8 (ref 5–15)
BUN: 19 mg/dL (ref 6–20)
BUN: 18 mg/dL (ref 6–20)
CO2: 27 mmol/L (ref 22–32)
CO2: 26 mmol/L (ref 22–32)
Calcium: 8 mg/dL — ABNORMAL LOW (ref 8.9–10.3)
Calcium: 8.4 mg/dL — ABNORMAL LOW (ref 8.9–10.3)
Chloride: 108 mmol/L (ref 98–111)
Chloride: 104 mmol/L (ref 98–111)
Creatinine, Ser: 0.97 mg/dL (ref 0.61–1.24)
Creatinine, Ser: 0.91 mg/dL (ref 0.61–1.24)
GFR calc Af Amer: >60 mL/min (ref >60)
GFR calc Af Amer: >60 mL/min (ref >60)
GFR calc non Af Amer: >60 mL/min (ref >60)
GFR calc non Af Amer: >60 mL/min (ref >60)
Glucose, Bld: 41 mg/dL — CL (ref 70–99)
Glucose, Bld: 202 mg/dL — ABNORMAL HIGH (ref 70–99)
Potassium: 3.8 mmol/L (ref 3.5–5.1)
Potassium: 3.8 mmol/L (ref 3.5–5.1)
Sodium: 141 mmol/L (ref 135–145)
Sodium: 138 mmol/L (ref 135–145)
Total Bilirubin: 0.3 mg/dL (ref 0.3–1.2)
Total Bilirubin: 0.3 mg/dL (ref 0.3–1.2)
Total Protein: 4.7 g/dL — ABNORMAL LOW (ref 6.5–8.1)
Total Protein: 6.2 g/dL — ABNORMAL LOW (ref 6.5–8.1)

## 2018-12-04 LAB — GLUCOSE, CAPILLARY
Glucose-Capillary: 142 mg/dL — ABNORMAL HIGH (ref 70–99)
Glucose-Capillary: 49 mg/dL — ABNORMAL LOW (ref 70–99)
Glucose-Capillary: 204 mg/dL — ABNORMAL HIGH (ref 70–99)
Glucose-Capillary: 310 mg/dL — ABNORMAL HIGH (ref 70–99)

## 2018-12-04 LAB — INTERLEUKIN-6, PLASMA: Interleukin-6, Plasma: 8.6 pg/mL (ref 0.0–12.2)

## 2018-12-04 LAB — CBG MONITORING, ED
Glucose-Capillary: 131 mg/dL — ABNORMAL HIGH (ref 70–99)
Glucose-Capillary: 269 mg/dL — ABNORMAL HIGH (ref 70–99)

## 2018-12-04 MED ORDER — ESCITALOPRAM OXALATE 10 MG PO TABS: 10.0000 mg | ORAL_TABLET | Freq: Every day | ORAL | 2 refills | Status: DC

## 2018-12-04 MED ORDER — ESCITALOPRAM OXALATE 10 MG PO TABS: 5.0000 mg | ORAL_TABLET | Freq: Every day | ORAL | 2 refills | Status: DC

## 2018-12-04 MED ORDER — CEPHALEXIN 500 MG PO CAPS: 500.0000 mg | ORAL_CAPSULE | Freq: Three times a day (TID) | ORAL | 0 refills | Status: AC

## 2018-12-04 MED ORDER — DEXTROSE 50 % IV SOLN
INTRAVENOUS | Status: AC
  Administered 2018-12-04: 50 mL
  Filled 2018-12-04: qty 50

## 2018-12-04 MED FILL — CEPHALEXIN 500 MG CAPSULE: 500 | 3 days supply | Qty: 9 | Fill #0

## 2018-12-04 MED FILL — ESCITALOPRAM 10 MG TABLET: 10 | 30 days supply | Qty: 15 | Fill #0

## 2018-12-04 NOTE — Plan of Care (Signed)
Adequate for discharge.

## 2018-12-04 NOTE — ED Triage Notes (Addendum)
Patient arrived with EMS from home - hypoglycemic= 37 received 1 ampule D50% 25 gms by EMS , alert but disoriented to time at arrival  . CBG= 131 at arrival . Respirations unlabored / denies pain .

## 2018-12-04 NOTE — ED Notes (Signed)
CBG Results of 269 reported to [place], Charity fundraiser.

## 2018-12-04 NOTE — Discharge Instructions (Addendum)
1.  Monitor your blood sugar at least 3 times a day.  At any time that you feel anxious, sweaty, nauseated or jittery, check your blood sugar. 2.  Make sure you always eat before taking insulin and follow your diet closely. 3.  Do not take the glipizide (Glucotrol), your diabetes medication pill.  Decrease your Levemir to 20 units at bedtime.  Do not take it tonight.  Make sure you are checking your blood sugar frequently.  Write down the numbers and discuss it with your doctor for additional changes to your medications as needed.

## 2018-12-04 NOTE — Discharge Summary (Addendum)
Physician Discharge Summary  Rodney Barber ONG:[id number] DOB: 20-Mar-1966 DOA: 12/02/2018  PCP: Gildardo Pounds, NP  Admit date: 12/02/2018 Discharge date: 12/04/2018  Admitted From: home Disposition:  home Recommendations for Outpatient Follow-up:  1. Follow up with PCP in 1-2 weeks 2. Please obtain CMP/CBC in one week New med started lexapro and keflex. Home Health none Equipment/Devices none Discharge Condition stable CODE STATUS:full Diet recommendation: carb modified  Brief/Interim Summary:53 y.o.male,w dm2, hypertension, h/o pancreatitis, apparently presents with AMS and hyperglycemia. Pt appears somewhat confused and can't provide good history.   In ED,  T 97.7 P 60 R 14, Bp 132/63 pox 100%  Trop 0.04 Glucose 319  Wbc 7.2, Hgb 13.3, Plt 255 Na 139, K 3.4, Bun 26, Creatinine 1.44 Calclium 8.3,  Alb 2.9 Ast 190, Alt 79, Alk phos 134, T. Bili 0.8 Ammonia 21 Lipase 16 ETOH <44 Tylenol <01 Salicylate level <7  Abg ph 7.453, pco2 37.4, Po2 127 CT brain IMPRESSION: No acute CT finding. Normal with exception of some atherosclerotic calcification of the major vessels at the base of the brain, often seen at this age.  RUQ ultrasound IMPRESSION: Normal study with exception of a very small amount of sludge in the gallbladder, of doubtful significance. There are no gallstones. There is no evidence of biliary obstruction. There is no evidence of liver parenchymal pathology.  Urine drug screen pending Urinalysis pending  Pt will be admitted for AMS and hyperglycemia, and abnormal liver function.   Discharge Diagnoses:  Principal Problem:   Altered mental status Active Problems:   Diabetes mellitus (HCC)   Protein-calorie malnutrition, severe (HCC)   Abnormal liver function   AMS (altered mental status)   Cough   AKI (acute kidney injury) ([place])   Altered behavior  #1 acute change in mental status secondary to UTI cocaine abuse and  depression.unfortunately urine culture has not been send.but UA consistent with UTI.patient has been depressed about recent death of his mom and his brother this month.they both died of non covid issues.dw his brother that feels concerned damaris  is withdrawn depressed not eating and taking care of himself.patient lived with his deceased family members.i will start him on lexapro.will dc on keflex for 3 days.he also had some hematuria which is resolving.he was treated with ivf and rocephin.. Patient was seen by physical therapy recommended home health PT and rolling walker. . 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 continue Levemir insulin.  #3 abnormal LFTs with elevated AST more than ALT improving..  #4 elevated troponin with urine drug screen positive for cocaine with no acute EKG changes echo done follow-up  #5 mild AKI resolved with fluids  #6 hypertension continue lisinopril  #7 depression started lexapro..   . Estimated body mass index is 18.85 kg/m as calculated from the following:   Height as of this encounter: 5' 6"  (1.676 m).   Weight as of 03/15/18: 53 kg.  Discharge Instructions  Discharge Instructions    Diet - low sodium heart healthy   Complete by:  As directed    Increase activity slowly   Complete by:  As directed      Allergies as of 12/04/2018   No Known Allergies     Medication List    TAKE these medications   cephALEXin 500 MG capsule Commonly known as:  KEFLEX Take 1 capsule (500 mg total) by mouth 3 (three) times daily for 3 days.   glipiZIDE 5 MG tablet Commonly known as:  GLUCOTROL  Take 1 tablet (5 mg total) by mouth 2 (two) times daily before a meal.   glucose blood test strip Commonly known as:  True Metrix Blood Glucose Test Use as instructed   insulin detemir 100 UNIT/ML injection Commonly known as:  Levemir Inject 0.25 mLs (25 Units total) into the skin at bedtime.   lipase/protease/amylase 12000 units Cpep  capsule Commonly known as:  CREON Take 1 capsule (12,000 Units total) by mouth 3 (three) times daily with meals.   lisinopril 5 MG tablet Commonly known as:  ZESTRIL Take 1 tablet (5 mg total) by mouth daily.   TRUEplus Lancets 28G Misc uad   [place] Blood Glucose w/Device Kit uad      Follow-up Information    Gildardo Pounds, NP Follow up.   Specialty:  Nurse Practitioner Contact information: [place] [address] [phone]          No Known Allergies  Consultations: none  Procedures/Studies: Ct Head Wo Contrast  Result Date: 12/02/2018 CLINICAL DATA:  Lethargic and altered mental status over the last 2 days. EXAM: CT HEAD WITHOUT CONTRAST TECHNIQUE: Contiguous axial images were obtained from the base of the skull through the vertex without intravenous contrast. COMPARISON:  None. FINDINGS: Brain: The brain shows a normal appearance without evidence of malformation, atrophy, old or acute small or large vessel infarction, mass lesion, hemorrhage, hydrocephalus or extra-axial collection. Vascular: There is mild atherosclerotic calcification of the major vessels at the base of the brain. Skull: Normal.  No traumatic finding.  No focal bone lesion. Sinuses/Orbits: Mild mucosal thickening of the right maxillary sinus. No layering fluid or advanced disease. Orbits negative. Other: None significant IMPRESSION: No acute CT finding. Normal with exception of some atherosclerotic calcification of the major vessels at the base of the brain, often seen at this age. Electronically Signed   By: Nelson Chimes M.D.   On: 12/02/2018 19:00   Dg Chest Port 1 View  Result Date: 12/02/2018 CLINICAL DATA:  Initial evaluation for acute altered mental status. EXAM: PORTABLE CHEST 1 VIEW COMPARISON:  Prior radiograph from 07/10/2013. FINDINGS: Cardiac and mediastinal silhouettes are stable in size and contour, and remain within normal limits. Aortic atherosclerosis. Lungs  mildly hypoinflated. No focal infiltrates, edema, or effusion. No pneumothorax. No acute osseous finding. IMPRESSION: 1. No active cardiopulmonary disease. 2. Aortic atherosclerosis. Electronically Signed   By: Jeannine Boga M.D.   On: 12/02/2018 22:04   US Abdomen Limited Ruq  Result Date: 12/02/2018 CLINICAL DATA:  Elevated liver function tests. EXAM: ULTRASOUND ABDOMEN LIMITED RIGHT UPPER QUADRANT COMPARISON:  None. FINDINGS: Gallbladder: There is a small amount of sludge dependent in the gallbladder but no evidence of gallstones. No Murphy sign. No wall thickening or adjacent fluid. Common bile duct: Diameter: 4.5 mm.  Normal. Liver: Normal echogenicity. No focal lesion. No biliary ductal dilatation. Portal vein is patent on color Doppler imaging with normal direction of blood flow towards the liver. IMPRESSION: Normal study with exception of a very small amount of sludge in the gallbladder, of doubtful significance. There are no gallstones. There is no evidence of biliary obstruction. There is no evidence of liver parenchymal pathology. Electronically Signed   By: Nelson Chimes M.D.   On: 12/02/2018 21:08    (Echo, Carotid, EGD, Colonoscopy, ERCP)    Subjective: Resting in bed in nad answers yes and no staff reported he ate well yesterday.Marland Kitchen  Discharge Exam: Vitals:   12/04/18 0328 12/04/18 0739  BP: 120/66 130/72  Pulse: 73 60  Resp: 18 13  Temp: 98 F (36.7 C) 98.2 F (36.8 C)  SpO2: 100% 97%   Vitals:   12/03/18 1925 12/03/18 2328 12/04/18 0328 12/04/18 0739  BP: 120/61 118/71 120/66 130/72  Pulse: 68 65 73 60  Resp: 18 18 18 13   Temp: 98 F (36.7 C) 98.3 F (36.8 C) 98 F (36.7 C) 98.2 F (36.8 C)  TempSrc: Oral Oral Oral Axillary  SpO2: 100% 100% 100% 97%  Height:        General: Pt is alert, awake, not in acute distress Cardiovascular: RRR, S1/S2 +, no rubs, no gallops Respiratory: CTA bilaterally, no wheezing, no rhonchi Abdominal: Soft, NT, ND, bowel sounds  + Extremities: no edema, no cyanosis    The results of significant diagnostics from this hospitalization (including imaging, microbiology, ancillary and laboratory) are listed below for reference.     Microbiology: Recent Results (from the past 240 hour(s))  SARS Coronavirus 2 [facility] order, Performed in [facility] hospital lab)     Status: None   Collection Time: 12/02/18 11:47 PM  Result Value Ref Range Status   SARS Coronavirus 2 NEGATIVE NEGATIVE Final    Comment: (NOTE) If result is NEGATIVE SARS-CoV-2 target nucleic acids are NOT DETECTED. The SARS-CoV-2 RNA is generally detectable in upper and lower  respiratory specimens during the acute phase of infection. The lowest  concentration of SARS-CoV-2 viral copies this assay can detect is 250  copies / mL. A negative result does not preclude SARS-CoV-2 infection  and should not be used as the sole basis for treatment or other  patient management decisions.  A negative result may occur with  improper specimen collection / handling, submission of specimen other  than nasopharyngeal swab, presence of viral mutation(s) within the  areas targeted by this assay, and inadequate number of viral copies  (<250 copies / mL). A negative result must be combined with clinical  observations, patient history, and epidemiological information. If result is POSITIVE SARS-CoV-2 target nucleic acids are DETECTED. The SARS-CoV-2 RNA is generally detectable in upper and lower  respiratory specimens dur ing the acute phase of infection.  Positive  results are indicative of active infection with SARS-CoV-2.  Clinical  correlation with patient history and other diagnostic information is  necessary to determine patient infection status.  Positive results do  not rule out bacterial infection or co-infection with other viruses. If result is PRESUMPTIVE POSTIVE SARS-CoV-2 nucleic acids MAY BE PRESENT.   A presumptive positive result was obtained on  the submitted specimen  and confirmed on repeat testing.  While 2019 novel coronavirus  (SARS-CoV-2) nucleic acids may be present in the submitted sample  additional confirmatory testing may be necessary for epidemiological  and / or clinical management purposes  to differentiate between  SARS-CoV-2 and other Sarbecovirus currently known to infect humans.  If clinically indicated additional testing with an alternate test  methodology [phone]) is advised. The SARS-CoV-2 RNA is generally  detectable in upper and lower respiratory sp ecimens during the acute  phase of infection. The expected result is Negative. Fact Sheet for Patients:  StrictlyIdeas.no Fact Sheet for Healthcare Providers: BankingDealers.co.za This test is not yet approved or cleared by the [place] FDA and has been authorized for detection and/or diagnosis of SARS-CoV-2 by FDA under an Emergency Use Authorization (EUA).  This EUA will remain in effect (meaning this test can be used) for the duration of the COVID-19 declaration under Section 564(b)(1) of the Act,  21 U.S.C. section 360bbb-3(b)(1), unless the authorization is terminated or revoked sooner. Performed at [facility], [place] [address], [place], [place] [address]      Labs: BNP (last 3 results) No results for input(s): BNP in the last 8760 hours. Basic Metabolic Panel: Recent Labs  Lab 12/02/18 1645 12/02/18 1830 12/03/18 0201 12/04/18 0424  NA 139 136 141 141  K 3.4* 3.5 3.6 3.8  CL 98  --  106 108  CO2 25  --  23 27  GLUCOSE 319*  --  100* 41*  BUN 26*  --  20 19  CREATININE 1.44*  --  1.08 0.97  CALCIUM 8.3*  --  8.0* 8.0*   Liver Function Tests: Recent Labs  Lab 12/02/18 1645 12/03/18 0201 12/04/18 0424  AST 190* 153* 97*  ALT 79* 71* 54*  ALKPHOS 134* 124 99  BILITOT 0.8 0.6 0.3  PROT 6.0* 5.7* 4.7*  ALBUMIN 2.9* 2.8* 2.3*   Recent Labs  Lab 12/02/18 1645  LIPASE 16    Recent Labs  Lab 12/02/18 1645  AMMONIA 21   CBC: Recent Labs  Lab 12/02/18 1645 12/02/18 1830 12/03/18 0201 12/04/18 0424  WBC 7.2  --  7.3 6.5  NEUTROABS 5.6  --   --  3.7  HGB 13.3 12.9* 12.5* 10.3*  HCT 41.5 38.0* 38.2* 32.1*  MCV 90.8  --  89.5 90.7  PLT 255  --  262 224   Cardiac Enzymes: Recent Labs  Lab 12/02/18 1704 12/02/18 2335 12/03/18 0201 12/03/18 0949  CKTOTAL  --  856*  --   --   CKMB  --  127.6*  --   --   TROPONINI 0.04* 0.04* 0.05* 0.05*   BNP: Invalid input(s): POCBNP CBG: Recent Labs  Lab 12/03/18 2244 12/03/18 2328 12/04/18 0327 12/04/18 0610 12/04/18 0641  GLUCAP 183* 223* 142* 49* 204*   D-Dimer Recent Labs    12/03/18 0348  DDIMER 0.76*   Hgb A1c No results for input(s): HGBA1C in the last 72 hours. Lipid Profile No results for input(s): CHOL, HDL, LDLCALC, TRIG, CHOLHDL, LDLDIRECT in the last 72 hours. Thyroid function studies No results for input(s): TSH, T4TOTAL, T3FREE, THYROIDAB in the last 72 hours.  Invalid input(s): FREET3 Anemia work up Recent Labs    12/03/18 0348  FERRITIN 269   Urinalysis    Component Value Date/Time   COLORURINE YELLOW 12/03/2018 0000   APPEARANCEUR CLOUDY (A) 12/03/2018 0000   LABSPEC 1.016 12/03/2018 0000   PHURINE 5.0 12/03/2018 0000   GLUCOSEU >=500 (A) 12/03/2018 0000   HGBUR LARGE (A) 12/03/2018 0000   BILIRUBINUR NEGATIVE 12/03/2018 0000   BILIRUBINUR negative 03/15/2018 1049   BILIRUBINUR negative 12/26/2017 0941   KETONESUR 20 (A) 12/03/2018 0000   PROTEINUR 100 (A) 12/03/2018 0000   UROBILINOGEN 0.2 03/15/2018 1049   UROBILINOGEN 0.2 12/08/2010 1613   NITRITE POSITIVE (A) 12/03/2018 0000   LEUKOCYTESUR MODERATE (A) 12/03/2018 0000   Sepsis Labs Invalid input(s): PROCALCITONIN,  WBC,  LACTICIDVEN Microbiology Recent Results (from the past 240 hour(s))  SARS Coronavirus 2 [facility] order, Performed in [place] hospital lab)     Status: None   Collection Time:  12/02/18 11:47 PM  Result Value Ref Range Status   SARS Coronavirus 2 NEGATIVE NEGATIVE Final    Comment: (NOTE) If result is NEGATIVE SARS-CoV-2 target nucleic acids are NOT DETECTED. The SARS-CoV-2 RNA is generally detectable in upper and lower  respiratory specimens during the acute phase of infection.  The lowest  concentration of SARS-CoV-2 viral copies this assay can detect is 250  copies / mL. A negative result does not preclude SARS-CoV-2 infection  and should not be used as the sole basis for treatment or other  patient management decisions.  A negative result may occur with  improper specimen collection / handling, submission of specimen other  than nasopharyngeal swab, presence of viral mutation(s) within the  areas targeted by this assay, and inadequate number of viral copies  (<250 copies / mL). A negative result must be combined with clinical  observations, patient history, and epidemiological information. If result is POSITIVE SARS-CoV-2 target nucleic acids are DETECTED. The SARS-CoV-2 RNA is generally detectable in upper and lower  respiratory specimens dur ing the acute phase of infection.  Positive  results are indicative of active infection with SARS-CoV-2.  Clinical  correlation with patient history and other diagnostic information is  necessary to determine patient infection status.  Positive results do  not rule out bacterial infection or co-infection with other viruses. If result is PRESUMPTIVE POSTIVE SARS-CoV-2 nucleic acids MAY BE PRESENT.   A presumptive positive result was obtained on the submitted specimen  and confirmed on repeat testing.  While 2019 novel coronavirus  (SARS-CoV-2) nucleic acids may be present in the submitted sample  additional confirmatory testing may be necessary for epidemiological  and / or clinical management purposes  to differentiate between  SARS-CoV-2 and other Sarbecovirus currently known to infect humans.  If clinically  indicated additional testing with an alternate test  methodology [phone]) is advised. The SARS-CoV-2 RNA is generally  detectable in upper and lower respiratory sp ecimens during the acute  phase of infection. The expected result is Negative. Fact Sheet for Patients:  StrictlyIdeas.no Fact Sheet for Healthcare Providers: BankingDealers.co.za This test is not yet approved or cleared by the [place] FDA and has been authorized for detection and/or diagnosis of SARS-CoV-2 by FDA under an Emergency Use Authorization (EUA).  This EUA will remain in effect (meaning this test can be used) for the duration of the COVID-19 declaration under Section 564(b)(1) of the Act, 21 U.S.C. section 360bbb-3(b)(1), unless the authorization is terminated or revoked sooner. Performed at [facility], [place] [address], [place], [place] [address]      Time coordinating discharge:  34 minutes  SIGNED:   Georgette Shell, MD  [facility] 12/04/2018, 8:17 AM Pager   If 7PM-7AM, please contact night-coverage [email or web address] Password [email or web address]

## 2018-12-04 NOTE — Care Management (Signed)
Patient was discharge from [facility] hospital today, [facility] team noted patient agreed  to pick up medications at the [facility] upon discharge. HH services was also arranged with [facility].

## 2018-12-04 NOTE — Progress Notes (Signed)
Physical Therapy Treatment Patient Details Name: Rodney Barber MRN: [id number] DOB: 1966/05/21 Today's Date: 12/04/2018    History of Present Illness Pt is a 53 y.o. male with PMH including but not limited to DM and HTN presents to ED with AMS and hyperglycemia. Found to have elevated troponins, abnormal liver function, + for cocaine. No acute findings on CT scan.     PT Comments    Pt making slow progress with functional mobility. He remained very lethargic throughout session, therefore limiting his participation. Pt would continue to benefit from skilled physical therapy services at this time while admitted and after d/c to address the below listed limitations in order to improve overall safety and independence with functional mobility.    Follow Up Recommendations  Home health PT;Supervision for mobility/OOB     Equipment Recommendations  Rolling walker with 5" wheels    Recommendations for Other Services       Precautions / Restrictions Precautions Precautions: None Restrictions Weight Bearing Restrictions: No    Mobility  Bed Mobility Overal bed mobility: Modified Independent             General bed mobility comments: increased time and effort to come to EoB  Transfers Overall transfer level: Needs assistance Equipment used: None Transfers: Sit to/from Stand Sit to Stand: Min guard         General transfer comment: min guard for safety, increased effort  Ambulation/Gait Ambulation/Gait assistance: Min guard;Min assist Gait Distance (Feet): 20 Feet Assistive device: None Gait Pattern/deviations: Step-through pattern;Decreased step length - right;Decreased step length - left;Decreased stride length Gait velocity: decr   General Gait Details: pt lethargic throughout and stopping to stand and rest frequently; pt ambulating to his bathroom and urinated standing up; close min guard with occasional min A needed for balance and safety   Stairs              Wheelchair Mobility    Modified Rankin (Stroke Patients Only)       Balance Overall balance assessment: Needs assistance Sitting-balance support: Feet supported Sitting balance-Leahy Scale: Fair     Standing balance support: During functional activity;No upper extremity supported Standing balance-Leahy Scale: Fair                              Cognition Arousal/Alertness: Lethargic Behavior During Therapy: Flat affect Overall Cognitive Status: Impaired/Different from baseline Area of Impairment: Attention;Memory;Following commands;Safety/judgement;Awareness;Problem solving                   Current Attention Level: Sustained Memory: Decreased short-term memory Following Commands: Follows one step commands with increased time;Follows multi-step commands inconsistently Safety/Judgement: Decreased awareness of safety;Decreased awareness of deficits Awareness: Intellectual Problem Solving: Slow processing;Decreased initiation;Requires verbal cues        Exercises      General Comments        Pertinent Vitals/Pain Pain Assessment: No/denies pain    Home Living                      Prior Function            PT Goals (current goals can now be found in the care plan section) Acute Rehab PT Goals PT Goal Formulation: With patient Time For Goal Achievement: 12/17/18 Potential to Achieve Goals: Fair Progress towards PT goals: Progressing toward goals    Frequency    Min 3X/week      PT Plan  Current plan remains appropriate    Co-evaluation              AM-PAC PT "6 Clicks" Mobility   Outcome Measure  Help needed turning from your back to your side while in a flat bed without using bedrails?: None Help needed moving from lying on your back to sitting on the side of a flat bed without using bedrails?: None Help needed moving to and from a bed to a chair (including a wheelchair)?: A Little Help needed standing up  from a chair using your arms (e.g., wheelchair or bedside chair)?: A Little Help needed to walk in hospital room?: A Little Help needed climbing 3-5 steps with a railing? : A Lot 6 Click Score: 19    End of Session   Activity Tolerance: Patient limited by lethargy Patient left: in bed;with call bell/phone within reach;with bed alarm set Nurse Communication: Mobility status PT Visit Diagnosis: Unsteadiness on feet (R26.81);Other abnormalities of gait and mobility (R26.89);Muscle weakness (generalized) (M62.81);Difficulty in walking, not elsewhere classified (R26.2)     Time: 8119-1478 PT Time Calculation (min) (ACUTE ONLY): 15 min  Charges:  $Gait Training: 8-22 mins                     Deborah Chalk, [place], DPT  Acute Rehabilitation Services Pager [phone] Office [phone]     Alessandra Bevels Payton Prinsen 12/04/2018, 10:17 AM

## 2018-12-04 NOTE — TOC Transition Note (Signed)
Transition of Care [facility]) - CM/SW Discharge Note   Patient Details  Name: Rodney Barber MRN: [id number] Date of Birth: [birth year]  Transition of Care [facility] CM/SW Contact:  Kermit Balo, RN Phone Number: 12/04/2018, 12:00 PM   Clinical Narrative:    Pt discharging home today. CM spoke to his brother and encouraged him to have someone stay with the patient or check on him over the next few days. He stated he would work on this. Pt's meds to [facility] pharmacy and they stated he would be able to pick these up today.  Brother to provide transport home.    Final next level of care: Home w Home Health Services(charity services) Barriers to Discharge: Barriers Resolved   Patient Goals and CMS Choice   CMS Medicare.gov Compare Post Acute Care list provided to:: Patient(pt to receive charity [facility]) Choice offered to / list presented to : Patient  Discharge Placement                       Discharge Plan and Services   Discharge Planning Services: CM Consult Post Acute Care Choice: Home Health, Durable Medical Equipment          DME Arranged: Walker rolling DME Agency: [employer] Date DME Agency Contacted: 12/04/18 Time DME Agency Contacted: 1000 Representative spoke with at DME Agency: Timothy Lasso HH Arranged: PT HH Agency: [facility] Date [facility] Agency Contacted: 12/04/18 Time HH Agency Contacted: [phone] Representative spoke with at [facility] Agency: Kandee Keen  Social Determinants of Health (SDOH) Interventions     Readmission Risk Interventions No flowsheet data found.

## 2018-12-06 ENCOUNTER — Telehealth: Payer: Self-pay | Admitting: Nurse Practitioner

## 2018-12-06 DIAGNOSIS — IMO0001 Reserved for inherently not codable concepts without codable children: Secondary | ICD-10-CM

## 2018-12-06 DIAGNOSIS — Z794 Long term (current) use of insulin: Principal | ICD-10-CM

## 2018-12-06 DIAGNOSIS — E1165 Type 2 diabetes mellitus with hyperglycemia: Principal | ICD-10-CM

## 2018-12-06 LAB — GLUCOSE, CAPILLARY: Glucose-Capillary: 171 mg/dL — ABNORMAL HIGH (ref 70–99)

## 2018-12-06 MED ORDER — GLUCOSE BLOOD VI STRP: ORAL_STRIP | 0 refills | Status: DC

## 2018-12-06 MED ORDER — GLIPIZIDE 5 MG PO TABS: 5.0000 mg | ORAL_TABLET | Freq: Two times a day (BID) | ORAL | 0 refills | Status: DC

## 2018-12-06 MED FILL — ?GLIPIZIDE 5MG TABLET: 5 | 30 days supply | Qty: 60 | Fill #0

## 2018-12-06 MED FILL — TRUE METRIX GLUCOSE TEST ST: 25 days supply | Qty: 100 | Fill #0

## 2018-12-06 NOTE — ED Provider Notes (Signed)
[facility] EMERGENCY DEPARTMENT Provider Note   CSN: [id number] Arrival date & time: 12/04/18  2112    History   Chief Complaint Chief Complaint  Patient presents with  . Hypoglycemia    CBG=34    HPI Rodney Barber is a 53 y.o. male.     HPI Patient was just discharged from hospital on the same day as evaluation.  He had been admitted for mental status change with diagnosis of UTI, cocaine abuse and depression.  He had hyperglycemia and discharge medications included Levemir and glipizide.  Patient reports that he does not recall eating before he took his insulin.  He reports he is living with his brother right now.  He reportedly had blood sugar at 37.  He was given amp of D 50 with correction to 131 on arrival.  Patient was given a meal which he ate.  I evaluated him after that.  Patient is alert and interactive.  He is answering questions appropriately.  He has no pain complaints. Past Medical History:  Diagnosis Date  . Diabetes mellitus   . Hypertension   . Pancreatitis     Patient Active Problem List   Diagnosis Date Noted  . AKI (acute kidney injury) ([place])   . Altered behavior   . Altered mental status 12/02/2018  . Abnormal liver function 12/02/2018  . AMS (altered mental status) 12/02/2018  . Cough 12/02/2018  . Uncontrolled type 2 diabetes mellitus ([place]) 03/15/2018  . Urine test positive for microalbuminuria 07/12/2017  . DM (diabetes mellitus) type 2, uncontrolled, with ketoacidosis ([place]) 09/08/2013  . Liver lesion 07/22/2013  . Alcohol-induced chronic pancreatitis ([place]) 07/22/2013  . Diabetes ([place]) 07/18/2013  . Hypokalemia 07/11/2013  . Protein-calorie malnutrition, severe ([place]) 07/11/2013  . Chronic pancreatitis ([place]) 07/10/2013  . 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 Diabetes mellitus ([place]) 07/10/2013  . Weight loss 07/10/2013  . Diarrhea 07/10/2013  . Liver lesion, right lobe 07/10/2013    Past Surgical History:   Procedure Laterality Date  . ERCP  06/27/2011   Procedure: ENDOSCOPIC RETROGRADE CHOLANGIOPANCREATOGRAPHY (ERCP);  Surgeon: Jeryl Columbia, MD;  Location: [facility];  Service: Endoscopy;  Laterality: N/A;  . ERCP W/ PLASTIC STENT PLACEMENT  03/2011        Home Medications    Prior to Admission medications   Medication Sig Start Date End Date Taking? Authorizing Provider  Blood Glucose Monitoring Suppl (TRUERESULT BLOOD GLUCOSE) w/Device KIT uad 04/23/17   Hairston, Maylon Peppers, FNP  cephALEXin (KEFLEX) 500 MG capsule Take 1 capsule (500 mg total) by mouth 3 (three) times daily for 3 days. 12/04/18 12/07/18  Georgette Shell, MD  escitalopram (LEXAPRO) 10 MG tablet Take 0.5 tablets (5 mg total) by mouth daily for 30 days. 12/04/18 01/03/19  Georgette Shell, MD  glipiZIDE (GLUCOTROL) 5 MG tablet Take 1 tablet (5 mg total) by mouth 2 (two) times daily before a meal. Patient not taking: Reported on 12/04/2018 09/21/17   Alfonse Spruce, FNP  glucose blood (TRUE METRIX BLOOD GLUCOSE TEST) test strip Use as instructed 04/23/17   Alfonse Spruce, FNP  insulin detemir (LEVEMIR) 100 UNIT/ML injection Inject 0.25 mLs (25 Units total) into the skin at bedtime. 03/15/18   Fulp, Cammie, MD  lipase/protease/amylase (CREON) 12000 units CPEP capsule Take 1 capsule (12,000 Units total) by mouth 3 (three) times daily with meals. 03/15/18   Fulp, Cammie, MD  lisinopril (PRINIVIL,ZESTRIL) 5 MG tablet Take 1 tablet (5 mg total) by  mouth daily. Patient not taking: Reported on 12/04/2018 09/21/17   Alfonse Spruce, FNP  TRUEPLUS LANCETS 28G MISC uad 04/23/17   Alfonse Spruce, FNP    Family History Family History  Problem Relation Age of Onset  . Diabetes Mother   . Diabetes Brother     Social History Social History   Tobacco Use  . Smoking status: Current Every Day Smoker    Packs/day: 0.50    Years: 30.00    Pack years: 15.00    Types: Cigarettes  . Smokeless tobacco: Never Used  .  Tobacco comment: 6 cigarett /day  Substance Use Topics  . Alcohol use: No  . Drug use: Yes    Types: "Crack" cocaine, Other-see comments, Cocaine    Comment: last smoked crack 12-16-16     Allergies   Patient has no known allergies.   Review of Systems Review of Systems 10 Systems reviewed and are negative for acute change except as noted in the HPI.   Physical Exam Updated Vital Signs BP 138/75 (BP Location: Right Arm)   Pulse 60   Temp 98.4 F (36.9 C) (Oral)   Resp 15   Ht 5' 8"  (1.727 m)   Wt 59 kg   SpO2 100%   BMI 19.77 kg/m   Physical Exam Constitutional:      Comments: Alert and interactive.  No distress.  HENT:     Head: Normocephalic and atraumatic.     Mouth/Throat:     Mouth: Mucous membranes are moist.     Pharynx: Oropharynx is clear.  Eyes:     Extraocular Movements: Extraocular movements intact.  Cardiovascular:     Rate and Rhythm: Normal rate and regular rhythm.  Pulmonary:     Effort: Pulmonary effort is normal.     Breath sounds: Normal breath sounds.  Abdominal:     General: There is no distension.     Palpations: Abdomen is soft.     Tenderness: There is no abdominal tenderness. There is no guarding.  Musculoskeletal: Normal range of motion.        General: No tenderness.  Skin:    General: Skin is warm and dry.  Neurological:     General: No focal deficit present.     Mental Status: He is oriented to person, place, and time.     Coordination: Coordination normal.  Psychiatric:        Mood and Affect: Mood normal.      ED Treatments / Results  Labs (all labs ordered are listed, but only abnormal results are displayed) Labs Reviewed  COMPREHENSIVE METABOLIC PANEL - Abnormal; Notable for the following components:      Result Value   Glucose, Bld 202 (*)    Calcium 8.4 (*)    Total Protein 6.2 (*)    Albumin 3.1 (*)    AST 139 (*)    ALT 74 (*)    All other components within normal limits  CBG MONITORING, ED - Abnormal;  Notable for the following components:   Glucose-Capillary 131 (*)    All other components within normal limits  CBG MONITORING, ED - Abnormal; Notable for the following components:   Glucose-Capillary 269 (*)    All other components within normal limits  CBC WITH DIFFERENTIAL/PLATELET    EKG EKG Interpretation  Date/Time:  Wednesday December 04 2018 21:20:03 EDT Ventricular Rate:  58 PR Interval:    QRS Duration: 83 QT Interval:  469 QTC Calculation: 461 R Axis:  47 Text Interpretation:  Sinus rhythm Probable left atrial enlargement Left ventricular hypertrophy no sig change from previos Confirmed by Charlesetta Shanks [id number]) on 12/04/2018 11:06:00 PM   Radiology No results found.  Procedures Procedures (including critical care time)  Medications Ordered in ED Medications - No data to display   Initial Impression / Assessment and Plan / ED Course  I have reviewed the triage vital signs and the nursing notes.  Pertinent labs & imaging results that were available during my care of the patient were reviewed by me and considered in my medical decision making (see chart for details).       At this time, it appears patient is hypoglycemic due to use of insulin and glipizide without eating.  He was monitored in emergency department and blood sugar rechecked.  He ate without difficulty.  At this time he is stable for discharge.  Return precautions reviewed.  Will decrease his insulin and glipizide temporarily with monitoring and close follow-up is advised.  Final Clinical Impressions(s) / ED Diagnoses   Final diagnoses:  Hypoglycemia    ED Discharge Orders    None       Charlesetta Shanks, MD 12/06/18 [phone]

## 2018-12-06 NOTE — Telephone Encounter (Signed)
One month refill sent, please advise patient he needs an appt before future refills can be approved.

## 2018-12-06 NOTE — Telephone Encounter (Signed)
New Message   1) Medication(s) Requested (by name): glipiZIDE (GLUCOTROL) 5 MG tablet and glucose blood (TRUE METRIX BLOOD GLUCOSE TEST) test strip  2) Pharmacy of Choice:[facility]  3) Special Requests:   Approved medications will be sent to the pharmacy, we will reach out if there is an issue.  Requests made after 3pm may not be addressed until the following business day!  If a patient is unsure of the name of the medication(s) please note and ask patient to call back when they are able to provide all info, do not send to responsible party until all information is available!

## 2018-12-08 LAB — CULTURE, BLOOD (ROUTINE X 2)
Culture: NO GROWTH
Culture: NO GROWTH

## 2018-12-17 ENCOUNTER — Other Ambulatory Visit: Payer: Self-pay

## 2018-12-17 ENCOUNTER — Ambulatory Visit: Payer: Self-pay | Attending: Primary Care | Admitting: Primary Care

## 2018-12-17 ENCOUNTER — Encounter: Payer: Self-pay | Admitting: Primary Care

## 2018-12-17 DIAGNOSIS — R945 Abnormal results of liver function studies: Secondary | ICD-10-CM

## 2018-12-17 DIAGNOSIS — K860 Alcohol-induced chronic pancreatitis: Secondary | ICD-10-CM

## 2018-12-17 DIAGNOSIS — I10 Essential (primary) hypertension: Secondary | ICD-10-CM

## 2018-12-17 DIAGNOSIS — Z09 Encounter for follow-up examination after completed treatment for conditions other than malignant neoplasm: Secondary | ICD-10-CM

## 2018-12-17 DIAGNOSIS — Z720 Tobacco use: Secondary | ICD-10-CM

## 2018-12-17 DIAGNOSIS — F321 Major depressive disorder, single episode, moderate: Secondary | ICD-10-CM

## 2018-12-17 DIAGNOSIS — IMO0001 Reserved for inherently not codable concepts without codable children: Secondary | ICD-10-CM

## 2018-12-17 MED ORDER — PANCRELIPASE (LIP-PROT-AMYL) 12000-38000 UNITS PO CPEP: 12000.0000 [IU] | ORAL_CAPSULE | Freq: Three times a day (TID) | ORAL | 3 refills | Status: DC

## 2018-12-17 MED ORDER — GLUCOSE BLOOD VI STRP: ORAL_STRIP | 0 refills | Status: DC

## 2018-12-17 MED ORDER — LISINOPRIL 5 MG PO TABS: 5.0000 mg | ORAL_TABLET | Freq: Every day | ORAL | 2 refills | Status: DC

## 2018-12-17 MED ORDER — INSULIN DETEMIR 100 UNIT/ML ~~LOC~~ SOLN: 25.0000 [IU] | Freq: Every day | SUBCUTANEOUS | 11 refills | Status: DC

## 2018-12-17 MED ORDER — ESCITALOPRAM OXALATE 10 MG PO TABS: 5.0000 mg | ORAL_TABLET | Freq: Every day | ORAL | 2 refills | Status: DC

## 2018-12-17 MED ORDER — GLIPIZIDE 5 MG PO TABS: 5.0000 mg | ORAL_TABLET | Freq: Two times a day (BID) | ORAL | 0 refills | Status: DC

## 2018-12-17 MED FILL — LISINOPRIL 5 MG TAB: 5 | 30 days supply | Qty: 30 | Fill #0

## 2018-12-17 NOTE — Progress Notes (Signed)
Medication refill

## 2018-12-17 NOTE — Progress Notes (Signed)
Virtual Visit via Telephone Note  I connected with Rodney Barber on 12/17/18 at  2:30 PM EDT by telephone and verified that I am speaking with the correct person using two identifiers.   I discussed the limitations, risks, security and privacy concerns of performing an evaluation and management service by telephone and the availability of in person appointments. I also discussed with the patient that there may be a patient responsible charge related to this service. The patient expressed understanding and agreed to proceed.   History of Present Illness: Hospital discharge follow up for hypoglycemia times 2.  He had been admitted for mental status change with diagnosis of UTI, cocaine abuse and depression.  He had hyperglycemia and discharge medications included Levemir and glipizide.   Observations/Objective: BP 138/75 (BP Location: Right Arm)   Pulse 60   Temp 98.4 F (36.9 C) (Oral)   Resp 15   Ht 5\' 8"  (1.727 m)   Wt 59 kg   SpO2 100%   BMI 19.77 kg/m (12/04/2018) Review of Systems  Constitutional: Negative.   HENT: Negative.   Eyes: Negative.   Respiratory: Negative.   Cardiovascular: Positive for leg swelling.  Gastrointestinal: Negative.   Genitourinary: Negative.   Musculoskeletal: Negative.   Skin: Negative.   Neurological: Positive for weakness and headaches.  Endo/Heme/Allergies: Negative.   Psychiatric/Behavioral: Negative.    Assessment and Plan: Rodney Barber was seen today for medication refill.  Diagnoses and all orders for this visit:  Abnormal liver function 12/04/2018 AST 139 ALT 74 will monitor labs closely    Uncontrolled type 2 diabetes mellitus without complication, with long-term current use of insulin (HCC) Noncompliant a hospital -admission , than ED visit both for hypoglycemia , unable to decipher or remember if he took medication without food. Note protinuria d/c cont ACE and Levimir  Drug screen pos for cocaine   Alcohol-induced chronic pancreatitis  (HCC) Resulting in  Elevated AST/ALT chronic- each encounter d/w drinking cessation /therpy  Essential hypertension On ACE for Bp and renal protection cont   Tobacco abuse Encourage cessation of tobacco and other illicit drug use  Hospital discharge follow-up 2 encounters of hypoglycemia     Follow Up Instructions:    I discussed the assessment and treatment plan with the patient. The patient was provided an opportunity to ask questions and all were answered. The patient agreed with the plan and demonstrated an understanding of the instructions.   The patient was advised to call back or seek an in-person evaluation if the symptoms worsen or if the condition fails to improve as anticipated.  I provided 45 minutes of non-face-to-face time during this encounter. This includes reviewing hospital and ED encounters   Grayce Sessions, NP

## 2018-12-18 ENCOUNTER — Encounter: Payer: Self-pay | Admitting: Primary Care

## 2019-02-18 ENCOUNTER — Other Ambulatory Visit: Payer: Self-pay

## 2019-02-18 ENCOUNTER — Inpatient Hospital Stay (HOSPITAL_COMMUNITY)
Admission: EM | Admit: 2019-02-18 | Discharge: 2019-03-06 | DRG: 917 | Disposition: A | Discharge status: Home / Self Care | Payer: Self-pay | Attending: Internal Medicine | Admitting: Internal Medicine

## 2019-02-18 ENCOUNTER — Inpatient Hospital Stay (HOSPITAL_COMMUNITY): Payer: Self-pay

## 2019-02-18 ENCOUNTER — Encounter (HOSPITAL_COMMUNITY): Payer: Self-pay | Admitting: Emergency Medicine

## 2019-02-18 DIAGNOSIS — N139 Obstructive and reflux uropathy, unspecified: Secondary | ICD-10-CM | POA: Diagnosis present

## 2019-02-18 DIAGNOSIS — F10239 Alcohol dependence with withdrawal, unspecified: Secondary | ICD-10-CM | POA: Diagnosis present

## 2019-02-18 DIAGNOSIS — Z515 Encounter for palliative care: Secondary | ICD-10-CM | POA: Diagnosis present

## 2019-02-18 DIAGNOSIS — E43 Unspecified severe protein-calorie malnutrition: Secondary | ICD-10-CM | POA: Diagnosis present

## 2019-02-18 DIAGNOSIS — Z7189 Other specified counseling: Secondary | ICD-10-CM

## 2019-02-18 DIAGNOSIS — Z794 Long term (current) use of insulin: Secondary | ICD-10-CM

## 2019-02-18 DIAGNOSIS — F17210 Nicotine dependence, cigarettes, uncomplicated: Secondary | ICD-10-CM | POA: Diagnosis present

## 2019-02-18 DIAGNOSIS — E860 Dehydration: Secondary | ICD-10-CM | POA: Diagnosis present

## 2019-02-18 DIAGNOSIS — Z9114 Patient's other noncompliance with medication regimen: Secondary | ICD-10-CM

## 2019-02-18 DIAGNOSIS — K860 Alcohol-induced chronic pancreatitis: Secondary | ICD-10-CM | POA: Diagnosis present

## 2019-02-18 DIAGNOSIS — Z66 Do not resuscitate: Secondary | ICD-10-CM | POA: Diagnosis present

## 2019-02-18 DIAGNOSIS — K861 Other chronic pancreatitis: Secondary | ICD-10-CM | POA: Diagnosis present

## 2019-02-18 DIAGNOSIS — E1169 Type 2 diabetes mellitus with other specified complication: Secondary | ICD-10-CM

## 2019-02-18 DIAGNOSIS — Z789 Other specified health status: Secondary | ICD-10-CM

## 2019-02-18 DIAGNOSIS — T1491XA Suicide attempt, initial encounter: Secondary | ICD-10-CM

## 2019-02-18 DIAGNOSIS — E876 Hypokalemia: Secondary | ICD-10-CM | POA: Diagnosis present

## 2019-02-18 DIAGNOSIS — R0600 Dyspnea, unspecified: Secondary | ICD-10-CM

## 2019-02-18 DIAGNOSIS — E1010 Type 1 diabetes mellitus with ketoacidosis without coma: Secondary | ICD-10-CM

## 2019-02-18 DIAGNOSIS — F1410 Cocaine abuse, uncomplicated: Secondary | ICD-10-CM | POA: Diagnosis present

## 2019-02-18 DIAGNOSIS — D649 Anemia, unspecified: Secondary | ICD-10-CM | POA: Diagnosis present

## 2019-02-18 DIAGNOSIS — Z7289 Other problems related to lifestyle: Secondary | ICD-10-CM

## 2019-02-18 DIAGNOSIS — N320 Bladder-neck obstruction: Secondary | ICD-10-CM | POA: Diagnosis present

## 2019-02-18 DIAGNOSIS — IMO0001 Reserved for inherently not codable concepts without codable children: Secondary | ICD-10-CM

## 2019-02-18 DIAGNOSIS — T391X2A Poisoning by 4-Aminophenol derivatives, intentional self-harm, initial encounter: Principal | ICD-10-CM | POA: Diagnosis present

## 2019-02-18 DIAGNOSIS — F1990 Other psychoactive substance use, unspecified, uncomplicated: Secondary | ICD-10-CM

## 2019-02-18 DIAGNOSIS — Z833 Family history of diabetes mellitus: Secondary | ICD-10-CM

## 2019-02-18 DIAGNOSIS — N200 Calculus of kidney: Secondary | ICD-10-CM | POA: Diagnosis present

## 2019-02-18 DIAGNOSIS — N189 Chronic kidney disease, unspecified: Secondary | ICD-10-CM | POA: Diagnosis present

## 2019-02-18 DIAGNOSIS — Z781 Physical restraint status: Secondary | ICD-10-CM

## 2019-02-18 DIAGNOSIS — T391X1A Poisoning by 4-Aminophenol derivatives, accidental (unintentional), initial encounter: Secondary | ICD-10-CM

## 2019-02-18 DIAGNOSIS — Z20828 Contact with and (suspected) exposure to other viral communicable diseases: Secondary | ICD-10-CM | POA: Diagnosis present

## 2019-02-18 DIAGNOSIS — F329 Major depressive disorder, single episode, unspecified: Secondary | ICD-10-CM | POA: Diagnosis present

## 2019-02-18 DIAGNOSIS — F1090 Alcohol use, unspecified, uncomplicated: Secondary | ICD-10-CM

## 2019-02-18 DIAGNOSIS — Z79899 Other long term (current) drug therapy: Secondary | ICD-10-CM

## 2019-02-18 DIAGNOSIS — E1100 Type 2 diabetes mellitus with hyperosmolarity without nonketotic hyperglycemic-hyperosmolar coma (NKHHC): Secondary | ICD-10-CM

## 2019-02-18 DIAGNOSIS — E872 Acidosis: Secondary | ICD-10-CM | POA: Diagnosis present

## 2019-02-18 DIAGNOSIS — K7200 Acute and subacute hepatic failure without coma: Secondary | ICD-10-CM | POA: Diagnosis present

## 2019-02-18 DIAGNOSIS — Z681 Body mass index (BMI) 19 or less, adult: Secondary | ICD-10-CM

## 2019-02-18 DIAGNOSIS — K831 Obstruction of bile duct: Secondary | ICD-10-CM | POA: Diagnosis present

## 2019-02-18 DIAGNOSIS — E11649 Type 2 diabetes mellitus with hypoglycemia without coma: Secondary | ICD-10-CM | POA: Diagnosis present

## 2019-02-18 DIAGNOSIS — N179 Acute kidney failure, unspecified: Secondary | ICD-10-CM | POA: Diagnosis present

## 2019-02-18 DIAGNOSIS — G9341 Metabolic encephalopathy: Secondary | ICD-10-CM | POA: Diagnosis present

## 2019-02-18 DIAGNOSIS — I129 Hypertensive chronic kidney disease with stage 1 through stage 4 chronic kidney disease, or unspecified chronic kidney disease: Secondary | ICD-10-CM | POA: Diagnosis present

## 2019-02-18 DIAGNOSIS — B179 Acute viral hepatitis, unspecified: Secondary | ICD-10-CM | POA: Diagnosis present

## 2019-02-18 DIAGNOSIS — E1110 Type 2 diabetes mellitus with ketoacidosis without coma: Secondary | ICD-10-CM | POA: Diagnosis present

## 2019-02-18 DIAGNOSIS — R64 Cachexia: Secondary | ICD-10-CM | POA: Diagnosis present

## 2019-02-18 DIAGNOSIS — D689 Coagulation defect, unspecified: Secondary | ICD-10-CM | POA: Diagnosis present

## 2019-02-18 HISTORY — DX: Other psychoactive substance use, unspecified, uncomplicated: F19.90

## 2019-02-18 LAB — LIPASE, BLOOD: Lipase: 19 U/L (ref 11–51)

## 2019-02-18 LAB — RAPID URINE DRUG SCREEN, HOSP PERFORMED
Amphetamines: NOT DETECTED
Barbiturates: NOT DETECTED
Benzodiazepines: NOT DETECTED
Cocaine: POSITIVE — AB
Opiates: NOT DETECTED
Tetrahydrocannabinol: NOT DETECTED

## 2019-02-18 LAB — COMPREHENSIVE METABOLIC PANEL
ALT: 2091 U/L — ABNORMAL HIGH (ref 0–44)
AST: 5859 U/L — ABNORMAL HIGH (ref 15–41)
Albumin: 3.3 g/dL — ABNORMAL LOW (ref 3.5–5.0)
Alkaline Phosphatase: 165 U/L — ABNORMAL HIGH (ref 38–126)
Anion gap: 26 — ABNORMAL HIGH (ref 5–15)
BUN: 30 mg/dL — ABNORMAL HIGH (ref 6–20)
CO2: 19 mmol/L — ABNORMAL LOW (ref 22–32)
Calcium: 8.8 mg/dL — ABNORMAL LOW (ref 8.9–10.3)
Chloride: 98 mmol/L (ref 98–111)
Creatinine, Ser: 2.44 mg/dL — ABNORMAL HIGH (ref 0.61–1.24)
GFR calc Af Amer: 33 mL/min — ABNORMAL LOW (ref >60)
GFR calc non Af Amer: 29 mL/min — ABNORMAL LOW (ref >60)
Glucose, Bld: 517 mg/dL (ref 70–99)
Potassium: 3.4 mmol/L — ABNORMAL LOW (ref 3.5–5.1)
Sodium: 143 mmol/L (ref 135–145)
Total Bilirubin: 2.3 mg/dL — ABNORMAL HIGH (ref 0.3–1.2)
Total Protein: 5.9 g/dL — ABNORMAL LOW (ref 6.5–8.1)

## 2019-02-18 LAB — CBC WITH DIFFERENTIAL/PLATELET
Abs Immature Granulocytes: 0.06 10*3/uL (ref 0.00–0.07)
Basophils Absolute: 0 10*3/uL (ref 0.0–0.1)
Basophils Relative: 0 %
Eosinophils Absolute: 0 10*3/uL (ref 0.0–0.5)
Eosinophils Relative: 0 %
HCT: 37.7 % — ABNORMAL LOW (ref 39.0–52.0)
Hemoglobin: 12.7 g/dL — ABNORMAL LOW (ref 13.0–17.0)
Immature Granulocytes: 1 %
Lymphocytes Relative: 6 %
Lymphs Abs: 0.7 10*3/uL (ref 0.7–4.0)
MCH: 30.2 pg (ref 26.0–34.0)
MCHC: 33.7 g/dL (ref 30.0–36.0)
MCV: 89.5 fL (ref 80.0–100.0)
Monocytes Absolute: 0.1 10*3/uL (ref 0.1–1.0)
Monocytes Relative: 1 %
Neutro Abs: 10.6 10*3/uL — ABNORMAL HIGH (ref 1.7–7.7)
Neutrophils Relative %: 92 %
Platelets: 233 10*3/uL (ref 150–400)
RBC: 4.21 MIL/uL — ABNORMAL LOW (ref 4.22–5.81)
RDW: 13.3 % (ref 11.5–15.5)
WBC: 11.5 10*3/uL — ABNORMAL HIGH (ref 4.0–10.5)
nRBC: 0 % (ref 0.0–0.2)

## 2019-02-18 LAB — POCT I-STAT EG7
Acid-Base Excess: 1 mmol/L (ref 0.0–2.0)
Bicarbonate: 23.6 mmol/L (ref 20.0–28.0)
Calcium, Ion: 0.97 mmol/L — ABNORMAL LOW (ref 1.15–1.40)
HCT: 38 % — ABNORMAL LOW (ref 39.0–52.0)
Hemoglobin: 12.9 g/dL — ABNORMAL LOW (ref 13.0–17.0)
O2 Saturation: 96 %
Potassium: 3.5 mmol/L (ref 3.5–5.1)
Sodium: 141 mmol/L (ref 135–145)
TCO2: 24 mmol/L (ref 22–32)
pCO2, Ven: 30.7 mmHg — ABNORMAL LOW (ref 44.0–60.0)
pH, Ven: 7.493 — ABNORMAL HIGH (ref 7.250–7.430)
pO2, Ven: 71 mmHg — ABNORMAL HIGH (ref 32.0–45.0)

## 2019-02-18 LAB — URINALYSIS, ROUTINE W REFLEX MICROSCOPIC
Bilirubin Urine: NEGATIVE
Glucose, UA: >=500 mg/dL — AB
Ketones, ur: 5 mg/dL — AB
Leukocytes,Ua: NEGATIVE
Nitrite: NEGATIVE
Protein, ur: 100 mg/dL — AB
RBC / HPF: >50 RBC/hpf — ABNORMAL HIGH (ref 0–5)
Specific Gravity, Urine: 1.024 (ref 1.005–1.030)
pH: 6 (ref 5.0–8.0)

## 2019-02-18 LAB — CBG MONITORING, ED: Glucose-Capillary: 407 mg/dL — ABNORMAL HIGH (ref 70–99)

## 2019-02-18 LAB — SALICYLATE LEVEL: Salicylate Lvl: 7.9 mg/dL (ref 2.8–30.0)

## 2019-02-18 LAB — ACETAMINOPHEN LEVEL: Acetaminophen (Tylenol), Serum: 135 ug/mL — ABNORMAL HIGH (ref 10–30)

## 2019-02-18 LAB — MAGNESIUM: Magnesium: 2.6 mg/dL — ABNORMAL HIGH (ref 1.7–2.4)

## 2019-02-18 LAB — SARS CORONAVIRUS 2 BY RT PCR (HOSPITAL ORDER, PERFORMED IN ~~LOC~~ HOSPITAL LAB): SARS Coronavirus 2: NEGATIVE

## 2019-02-18 LAB — ETHANOL: Alcohol, Ethyl (B): <10 mg/dL (ref <10)

## 2019-02-18 MED ORDER — FOLIC ACID 1 MG PO TABS
1.0000 mg | ORAL_TABLET | Freq: Every day | ORAL | Status: DC
  Administered 2019-02-23 – 2019-03-06 (×12): 1 mg via ORAL
  Filled 2019-02-18 (×13): qty 1

## 2019-02-18 MED ORDER — LORAZEPAM 2 MG/ML IJ SOLN
2.0000 mg | INTRAMUSCULAR | Status: DC | PRN
  Administered 2019-02-19 (×3): 2 mg via INTRAVENOUS
  Filled 2019-02-18 (×3): qty 1

## 2019-02-18 MED ORDER — SODIUM CHLORIDE 0.9 % IV SOLN
INTRAVENOUS | Status: DC
  Administered 2019-02-19: 02:00:00 via INTRAVENOUS

## 2019-02-18 MED ORDER — POTASSIUM CHLORIDE 10 MEQ/100ML IV SOLN
10.0000 meq | INTRAVENOUS | Status: AC
  Administered 2019-02-19: 10 meq via INTRAVENOUS
  Filled 2019-02-18: qty 100

## 2019-02-18 MED ORDER — SODIUM CHLORIDE 0.9 % IV BOLUS
1000.0000 mL | Freq: Once | INTRAVENOUS | Status: AC
  Administered 2019-02-18: 1000 mL via INTRAVENOUS

## 2019-02-18 MED ORDER — INSULIN ASPART 100 UNIT/ML ~~LOC~~ SOLN
10.0000 [IU] | Freq: Once | SUBCUTANEOUS | Status: AC
  Administered 2019-02-18: 10 [IU] via INTRAVENOUS

## 2019-02-18 MED ORDER — INSULIN REGULAR(HUMAN) IN NACL 100-0.9 UT/100ML-% IV SOLN
INTRAVENOUS | Status: DC
  Administered 2019-02-19: 1.9 [IU]/h via INTRAVENOUS
  Filled 2019-02-18: qty 100

## 2019-02-18 MED ORDER — HYDRALAZINE HCL 20 MG/ML IJ SOLN
5.0000 mg | Freq: Four times a day (QID) | INTRAMUSCULAR | Status: DC | PRN
  Administered 2019-02-19: 5 mg via INTRAVENOUS
  Filled 2019-02-18 (×2): qty 1

## 2019-02-18 MED ORDER — DEXTROSE-NACL 5-0.45 % IV SOLN
INTRAVENOUS | Status: DC
  Administered 2019-02-19 – 2019-02-22 (×8): via INTRAVENOUS

## 2019-02-18 MED ORDER — DEXTROSE 5 % IV SOLN
15.0000 mg/kg/h | INTRAVENOUS | Status: DC
  Filled 2019-02-18: qty 120

## 2019-02-18 MED ORDER — PROMETHAZINE HCL 25 MG/ML IJ SOLN
6.2500 mg | Freq: Four times a day (QID) | INTRAMUSCULAR | Status: DC | PRN
  Administered 2019-02-19: 6.25 mg via INTRAVENOUS
  Filled 2019-02-18: qty 1

## 2019-02-18 MED ORDER — HEPARIN SODIUM (PORCINE) 5000 UNIT/ML IJ SOLN
5000.0000 [IU] | Freq: Three times a day (TID) | INTRAMUSCULAR | Status: DC
  Administered 2019-02-19: 03:00:00 5000 [IU] via SUBCUTANEOUS
  Filled 2019-02-18: qty 1

## 2019-02-18 MED ORDER — SODIUM CHLORIDE 0.9 % IV SOLN: INTRAVENOUS | Status: DC

## 2019-02-18 MED ORDER — VITAMIN B-1 100 MG PO TABS
100.0000 mg | ORAL_TABLET | Freq: Every day | ORAL | Status: DC
  Administered 2019-02-23 – 2019-03-06 (×12): 100 mg via ORAL
  Filled 2019-02-18 (×13): qty 1

## 2019-02-18 MED ORDER — NICOTINE 14 MG/24HR TD PT24
14.0000 mg | MEDICATED_PATCH | Freq: Every day | TRANSDERMAL | Status: DC
  Administered 2019-02-19 – 2019-03-06 (×14): 14 mg via TRANSDERMAL
  Filled 2019-02-18 (×14): qty 1

## 2019-02-18 MED ORDER — ADULT MULTIVITAMIN W/MINERALS CH
1.0000 | ORAL_TABLET | Freq: Every day | ORAL | Status: DC
  Administered 2019-02-23 – 2019-03-06 (×12): 1 via ORAL
  Filled 2019-02-18 (×13): qty 1

## 2019-02-18 MED ORDER — ACETYLCYSTEINE LOAD VIA INFUSION
150.0000 mg/kg | Freq: Once | INTRAVENOUS | Status: AC
  Administered 2019-02-19: 9180 mg via INTRAVENOUS
  Filled 2019-02-18: qty 230

## 2019-02-18 NOTE — ED Provider Notes (Addendum)
[facility] EMERGENCY DEPARTMENT Provider Note   CSN: [id number] Arrival date & time: 02/18/19  1942    History   Chief Complaint Chief Complaint  Patient presents with  . Hyperglycemia    CBG=526    HPI Rodney Barber is a 53 y.o. male.     HPI  Patient presents with concern of weakness, nausea. Patient is a insulin-dependent diabetic, with a history of pancreatitis, hypertension.  He notes that he has not been taking his medication regularly, as he has no access to them. Exact onset of symptoms is unclear, but at least over the past few days the patient has been progressively weak, with persistent nausea, anorexia. He has had several episodes of vomiting. No fever, no pain.   Past Medical History:  Diagnosis Date  . Diabetes mellitus   . Hypertension   . Pancreatitis     Patient Active Problem List   Diagnosis Date Noted  . AKI (acute kidney injury) ([place])   . Altered behavior   . Altered mental status 12/02/2018  . Abnormal liver function 12/02/2018  . AMS (altered mental status) 12/02/2018  . Cough 12/02/2018  . Uncontrolled type 2 diabetes mellitus ([place]) 03/15/2018  . Urine test positive for microalbuminuria 07/12/2017  . DM (diabetes mellitus) type 2, uncontrolled, with ketoacidosis ([place]) 09/08/2013  . Liver lesion 07/22/2013  . Alcohol-induced chronic pancreatitis ([place]) 07/22/2013  . Diabetes ([place]) 07/18/2013  . Hypokalemia 07/11/2013  . Protein-calorie malnutrition, severe ([place]) 07/11/2013  . Chronic pancreatitis ([place]) 07/10/2013  . 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 Diabetes mellitus ([place]) 07/10/2013  . Weight loss 07/10/2013  . Diarrhea 07/10/2013  . Liver lesion, right lobe 07/10/2013    Past Surgical History:  Procedure Laterality Date  . ERCP  06/27/2011   Procedure: ENDOSCOPIC RETROGRADE CHOLANGIOPANCREATOGRAPHY (ERCP);  Surgeon: Jeryl Columbia, MD;  Location: [facility];  Service: Endoscopy;  Laterality:  N/A;  . ERCP W/ PLASTIC STENT PLACEMENT  03/2011        Home Medications    Prior to Admission medications   Medication Sig Start Date End Date Taking? Authorizing Provider  Blood Glucose Monitoring Suppl (TRUERESULT BLOOD GLUCOSE) w/Device KIT uad 04/23/17  Yes Hairston, Mandesia R, FNP  escitalopram (LEXAPRO) 10 MG tablet Take 0.5 tablets (5 mg total) by mouth daily for 30 days. 12/17/18 02/18/19 Yes Kerin Perna, NP  glipiZIDE (GLUCOTROL) 5 MG tablet Take 1 tablet (5 mg total) by mouth 2 (two) times daily before a meal. 12/17/18  Yes Kerin Perna, NP  glucose blood (TRUE METRIX BLOOD GLUCOSE TEST) test strip Use as instructed 12/17/18  Yes Kerin Perna, NP  insulin detemir (LEVEMIR) 100 UNIT/ML injection Inject 0.25 mLs (25 Units total) into the skin at bedtime. 12/17/18  Yes Kerin Perna, NP  lipase/protease/amylase (CREON) 12000 units CPEP capsule Take 1 capsule (12,000 Units total) by mouth 3 (three) times daily with meals. 12/17/18  Yes Kerin Perna, NP  lisinopril (ZESTRIL) 5 MG tablet Take 1 tablet (5 mg total) by mouth daily. 12/17/18  Yes Kerin Perna, NP  TRUEPLUS LANCETS 28G MISC uad 04/23/17  Yes Alfonse Spruce, FNP    Family History Family History  Problem Relation Age of Onset  . Diabetes Mother   . Diabetes Brother     Social History Social History   Tobacco Use  . Smoking status: Current Every Day Smoker    Packs/day: 0.50    Years: 30.00  Pack years: 15.00    Types: Cigarettes  . Smokeless tobacco: Never Used  . Tobacco comment: 6 cigarett /day  Substance Use Topics  . Alcohol use: No  . Drug use: Yes    Types: "Crack" cocaine, Other-see comments, Cocaine    Comment: last smoked crack 12-16-16     Allergies   Patient has no known allergies.   Review of Systems Review of Systems  Constitutional:       Per HPI, otherwise negative  HENT:       Per HPI, otherwise negative  Respiratory:       Per HPI,  otherwise negative  Cardiovascular:       Per HPI, otherwise negative  Gastrointestinal: Positive for nausea and vomiting. Negative for abdominal pain.  Endocrine:       Negative aside from HPI  Genitourinary:       Neg aside from HPI   Musculoskeletal:       Per HPI, otherwise negative  Skin: Negative.   Neurological: Positive for weakness. Negative for syncope.     Physical Exam Updated Vital Signs BP (!) 174/75   Pulse 79   Temp 98.9 F (37.2 C) (Oral)   Resp 17   Ht 5' 8"  (1.727 m)   Wt 61.2 kg   SpO2 100%   BMI 20.53 kg/m   Physical Exam Vitals signs and nursing note reviewed.  Constitutional:      Appearance: He is well-developed. He is ill-appearing.  HENT:     Head: Normocephalic and atraumatic.  Eyes:     Conjunctiva/sclera: Conjunctivae normal.  Pulmonary:     Effort: Pulmonary effort is normal. No respiratory distress.     Breath sounds: No stridor.  Abdominal:     General: There is no distension.  Skin:    General: Skin is warm and dry.  Neurological:     Mental Status: He is alert and oriented to person, place, and time.      ED Treatments / Results  Labs (all labs ordered are listed, but only abnormal results are displayed) Labs Reviewed  COMPREHENSIVE METABOLIC PANEL - Abnormal; Notable for the following components:      Result Value   Potassium 3.4 (*)    CO2 19 (*)    Glucose, Bld 517 (*)    BUN 30 (*)    Creatinine, Ser 2.44 (*)    Calcium 8.8 (*)    Total Protein 5.9 (*)    Albumin 3.3 (*)    AST 5,859 (*)    ALT 2,091 (*)    Alkaline Phosphatase 165 (*)    Total Bilirubin 2.3 (*)    GFR calc non Af Amer 29 (*)    GFR calc Af Amer 33 (*)    Anion gap 26 (*)    All other components within normal limits  CBC WITH DIFFERENTIAL/PLATELET - Abnormal; Notable for the following components:   WBC 11.5 (*)    RBC 4.21 (*)    Hemoglobin 12.7 (*)    HCT 37.7 (*)    Neutro Abs 10.6 (*)    All other components within normal limits   POCT I-STAT EG7 - Abnormal; Notable for the following components:   pH, Ven 7.493 (*)    pCO2, Ven 30.7 (*)    pO2, Ven 71.0 (*)    Calcium, Ion 0.97 (*)    HCT 38.0 (*)    Hemoglobin 12.9 (*)    All other components within normal limits  CBG  MONITORING, ED - Abnormal; Notable for the following components:   Glucose-Capillary 407 (*)    All other components within normal limits  SARS CORONAVIRUS 2 (HOSPITAL ORDER, [facility])  ETHANOL  LIPASE, BLOOD  URINALYSIS, ROUTINE W REFLEX MICROSCOPIC  BLOOD GAS, VENOUS     Procedures Procedures (including critical care time)  Medications Ordered in ED Medications  sodium chloride 0.9 % bolus 1,000 mL (0 mLs Intravenous Stopped 02/18/19 2129)     Initial Impression / Assessment and Plan / ED Course  I have reviewed the triage vital signs and the nursing notes.  Pertinent labs & imaging results that were available during my care of the patient were reviewed by me and considered in my medical decision making (see chart for details).    Initial labs notable for substantial hyperglycemia.    Remaining labs notable for anion gap, acute kidney injury.  Patient has been receiving fluid resuscitation.  Will require additional insulin. 10:16 PM With gap acidosis, acute kidney injury, hyperglycemia, the patient will continue to receive fluids, will require admission.  This adult male with history of multiple medical issues including insulin-dependent diabetes, no recent medication use presents with fatigue, nausea, vomiting, is found to have hyperglycemia, acute kidney injury, anion gap acidosis.  Patient also found to have substantial liver function abnormalities, but again has a soft abdomen.  On however, given concern for both renal and hepatic dysfunction, ultrasound is pending with consideration obstruction. Findings more suggestive dehydration, medication noncompliance and alcohol use. Patient's cognition  improved, but with these notable abnormalities, after initial fluids, insulin, he will require admission for further monitoring, management.  Final Clinical Impressions(s) / ED Diagnoses   Final diagnoses:  Diabetic ketoacidosis without coma associated with type 1 diabetes mellitus ([place])  AKI (acute kidney injury) ([place])  CRITICAL CARE Performed by: Carmin Muskrat Total critical care time: 35 minutes Critical care time was exclusive of separately billable procedures and treating other patients. Critical care was necessary to treat or prevent imminent or life-threatening deterioration. Critical care was time spent personally by me on the following activities: development of treatment plan with patient and/or surrogate as well as nursing, discussions with consultants, evaluation of patient's response to treatment, examination of patient, obtaining history from patient or surrogate, ordering and performing treatments and interventions, ordering and review of laboratory studies, ordering and review of radiographic studies, pulse oximetry and re-evaluation of patient's condition.   ED Discharge Orders    None       Carmin Muskrat, MD 02/18/19 2218    Carmin Muskrat, MD 02/18/19 [phone]

## 2019-02-18 NOTE — Progress Notes (Addendum)
MEDICATION RELATED CONSULT NOTE - INITIAL   Pharmacy Consult for acetylcysteine Indication: tylenol OD  No Known Allergies  Patient Measurements: Height: 5\' 8"  (172.7 cm) Weight: 135 lb (61.2 kg) IBW/kg (Calculated) : 68.4   Vital Signs: Temp: 98.9 F (37.2 C) (07/14 1947) Temp Source: Oral (07/14 1947) BP: 174/75 (07/14 2030) Pulse Rate: 79 (07/14 2030) Intake/Output from previous day: No intake/output data recorded. Intake/Output from this shift: Total I/O In: 1000 [IV Piggyback:1000] Out: -   Labs: Recent Labs    02/18/19 1945 02/18/19 1959 02/18/19 2250  WBC 11.5*  --   --   HGB 12.7* 12.9*  --   HCT 37.7* 38.0*  --   PLT 233  --   --   CREATININE 2.44*  --   --   MG  --   --  2.6*  ALBUMIN 3.3*  --   --   PROT 5.9*  --   --   AST 5,859*  --   --   ALT 2,091*  --   --   ALKPHOS 165*  --   --   BILITOT 2.3*  --   --    Estimated Creatinine Clearance: 30 mL/min (A) (by C-G formula based on SCr of 2.44 mg/dL (H)).   Microbiology: No results found for this or any previous visit (from the past 720 hour(s)).  Medical History: Past Medical History:  Diagnosis Date  . Diabetes mellitus   . Hypertension   . Pancreatitis     Medications:  See medication history  Assessment: 53 yo man with elevated APAP level (135 ug/ml) and elevated LFTs to start acetadote therapy.  Of not, patient denies tylenol ingestion  Goal of Therapy:  Prevention of liver toxicity  Plan:  Acetlycysteine 150 mg/kg x 1 then 15 mg/kg/hr for 23 hours Will f/u repeat labs  Excell Seltzer Poteet 02/18/2019,11:46 PM   Addum:  Damaris Schooner with Lennette Bihari at [employer].  Agrees with NAC therapy.  Repeat labs in 4 hours Addum:  Toxicologist rec 1 dose fomepizole 15 mg/kg Okay with Dr Posey Pronto to order

## 2019-02-18 NOTE — H&P (Addendum)
History and Physical    [name] QPR:[id number] DOB: [birth year] DOA: 02/18/2019  PCP: Gildardo Pounds, NP  Patient coming from: Home  I have personally briefly reviewed patient's old medical records in [place]  Chief Complaint: Nausea and vomiting  HPI: Rodney Barber is a 54 y.o. male with medical history significant for uncontrolled insulin-dependent type 2 diabetes, hypertension, chronic pancreatitis with history of chronic biliary stenosis, tobacco use, alcohol use, and substance use disorder who presents to the ED for evaluation of persistent nausea and vomiting.  Patient states he had sudden onset of persistent nausea and vomiting beginning 2 days ago.  He says anytime he tries to drink fluids he would have immediate emesis.  He has not been able to keep anything down by mouth.  He is associated abdominal pain which occurs only with his episodes of emesis.  He reports having some tremors and diaphoresis.  He denies any chest pain, palpitations, or dyspnea.  He reports good urine output.  He denies any diarrhea.  He does admit that he has been missing his home insulin doses recently.  He denies any Tylenol use.  He reports smoking 2 packs/week.  He denies any alcohol use.  He reports occasional cocaine use with last use about 3 days ago.  ED Course:  Initial vitals showed BP 166/69, pulse 84, RR 20, temp 98.9 Fahrenheit, SPO2 98% on room air.  Labs are notable for serum glucose 517, sodium 143 (153 when corrected for hyperglycemia), potassium 3.4, bicarb 19, BUN 30, creatinine 2.44, albumin 3.3.WBC 11.5, hemoglobin 12.7, platelets 233,000, serum ethanol level undetectable. VBG showed pH 7.493, PCO2 30.7, PO2 71.  AST 5859, ALT 2091, alk phos 165, total bilirubin 2.3.Lipase 19.  Patient was given 1 L normal saline and 10 units NovoLog IV.  Abdominal ultrasound was ordered and pending.  The hospitalist service was consulted to admit for further evaluation and  management.  Review of Systems: All systems reviewed and are negative except as documented in history of present illness above.   Past Medical History:  Diagnosis Date  . Diabetes mellitus   . Hypertension   . Pancreatitis     Past Surgical History:  Procedure Laterality Date  . ERCP  06/27/2011   Procedure: ENDOSCOPIC RETROGRADE CHOLANGIOPANCREATOGRAPHY (ERCP);  Surgeon: Jeryl Columbia, MD;  Location: [facility];  Service: Endoscopy;  Laterality: N/A;  . ERCP W/ PLASTIC STENT PLACEMENT  03/2011    Social History:  reports that he has been smoking cigarettes. He has a 15.00 pack-year smoking history. He has never used smokeless tobacco. He reports current drug use. Drugs: "Crack" cocaine, Other-see comments, and Cocaine. He reports that he does not drink alcohol.  No Known Allergies  Family History  Problem Relation Age of Onset  . Diabetes Mother   . Diabetes Brother      Prior to Admission medications   Medication Sig Start Date End Date Taking? Authorizing Provider  Blood Glucose Monitoring Suppl (TRUERESULT BLOOD GLUCOSE) w/Device KIT uad 04/23/17  Yes Hairston, Mandesia R, FNP  escitalopram (LEXAPRO) 10 MG tablet Take 0.5 tablets (5 mg total) by mouth daily for 30 days. 12/17/18 02/18/19 Yes Kerin Perna, NP  glipiZIDE (GLUCOTROL) 5 MG tablet Take 1 tablet (5 mg total) by mouth 2 (two) times daily before a meal. 12/17/18  Yes Kerin Perna, NP  glucose blood (TRUE METRIX BLOOD GLUCOSE TEST) test strip Use as instructed 12/17/18  Yes Kerin Perna, NP  insulin detemir (LEVEMIR)  100 UNIT/ML injection Inject 0.25 mLs (25 Units total) into the skin at bedtime. 12/17/18  Yes Kerin Perna, NP  lipase/protease/amylase (CREON) 12000 units CPEP capsule Take 1 capsule (12,000 Units total) by mouth 3 (three) times daily with meals. 12/17/18  Yes Kerin Perna, NP  lisinopril (ZESTRIL) 5 MG tablet Take 1 tablet (5 mg total) by mouth daily. 12/17/18  Yes Kerin Perna, NP  TRUEPLUS LANCETS 28G MISC uad 04/23/17  Yes Alfonse Spruce, FNP    Physical Exam: Vitals:   02/18/19 1947 02/18/19 2000 02/18/19 2015 02/18/19 2030  BP:  (!) 166/69 (!) 174/92 (!) 174/75  Pulse:  84 86 79  Resp:  20 (!) 23 17  Temp: 98.9 F (37.2 C)     TempSrc: Oral     SpO2:  98% 100% 100%  Weight:      Height:        Constitutional: Cachectic man resting supine in bed, NAD, calm, comfortable Eyes: PERRL, scleral icterus  ENMT: Mucous membranes are dry. Posterior pharynx clear of any exudate or lesions. Neck: normal, supple, no masses. Respiratory: clear to auscultation bilaterally, no wheezing, no crackles. Normal respiratory effort. No accessory muscle use.  Cardiovascular: Tachycardic, no murmurs / rubs / gallops. No extremity edema. 2+ pedal pulses. Abdomen: no tenderness, no masses palpated. Bowel sounds positive.  Musculoskeletal: Thin extremities, no clubbing / cyanosis. No joint deformity upper and lower extremities. Good ROM, no contractures. Normal muscle tone.  Skin: no rashes, lesions, ulcers. No induration Neurologic: CN 2-12 grossly intact. Sensation intact, Strength 5/5 in all 4.  Psychiatric: Flat affect. Alert and oriented x 3. Normal mood.     Labs on Admission: I have personally reviewed following labs and imaging studies  CBC: Recent Labs  Lab 02/18/19 1945 02/18/19 1959  WBC 11.5*  --   NEUTROABS 10.6*  --   HGB 12.7* 12.9*  HCT 37.7* 38.0*  MCV 89.5  --   PLT 233  --    Basic Metabolic Panel: Recent Labs  Lab 02/18/19 1945 02/18/19 1959 02/18/19 2250  NA 143 141  --   K 3.4* 3.5  --   CL 98  --   --   CO2 19*  --   --   GLUCOSE 517*  --   --   BUN 30*  --   --   CREATININE 2.44*  --   --   CALCIUM 8.8*  --   --   MG  --   --  2.6*   GFR: Estimated Creatinine Clearance: 30 mL/min (A) (by C-G formula based on SCr of 2.44 mg/dL (H)). Liver Function Tests: Recent Labs  Lab 02/18/19 1945  AST 5,859*  ALT  2,091*  ALKPHOS 165*  BILITOT 2.3*  PROT 5.9*  ALBUMIN 3.3*   Recent Labs  Lab 02/18/19 1945  LIPASE 19   No results for input(s): AMMONIA in the last 168 hours. Coagulation Profile: No results for input(s): INR, PROTIME in the last 168 hours. Cardiac Enzymes: No results for input(s): CKTOTAL, CKMB, CKMBINDEX, TROPONINI in the last 168 hours. BNP (last 3 results) No results for input(s): PROBNP in the last 8760 hours. HbA1C: No results for input(s): HGBA1C in the last 72 hours. CBG: Recent Labs  Lab 02/18/19 1949  GLUCAP 407*   Lipid Profile: No results for input(s): CHOL, HDL, LDLCALC, TRIG, CHOLHDL, LDLDIRECT in the last 72 hours. Thyroid Function Tests: No results for input(s): TSH, T4TOTAL, FREET4, T3FREE, THYROIDAB in  the last 72 hours. Anemia Panel: No results for input(s): VITAMINB12, FOLATE, FERRITIN, TIBC, IRON, RETICCTPCT in the last 72 hours. Urine analysis:    Component Value Date/Time   COLORURINE YELLOW 12/03/2018 0000   APPEARANCEUR CLOUDY (A) 12/03/2018 0000   LABSPEC 1.016 12/03/2018 0000   PHURINE 5.0 12/03/2018 0000   GLUCOSEU >=500 (A) 12/03/2018 0000   HGBUR LARGE (A) 12/03/2018 0000   BILIRUBINUR NEGATIVE 12/03/2018 0000   BILIRUBINUR negative 03/15/2018 1049   BILIRUBINUR negative 12/26/2017 0941   KETONESUR 20 (A) 12/03/2018 0000   PROTEINUR 100 (A) 12/03/2018 0000   UROBILINOGEN 0.2 03/15/2018 1049   UROBILINOGEN 0.2 12/08/2010 1613   NITRITE POSITIVE (A) 12/03/2018 0000   LEUKOCYTESUR MODERATE (A) 12/03/2018 0000    Radiological Exams on Admission: No results found.  EKG: Ordered and pending.  Assessment/Plan Principal Problem:   Hyperosmolar non-ketotic state in patient with type 2 diabetes mellitus ([name]) Active Problems:   Chronic pancreatitis ([place])   Hypokalemia   AKI (acute kidney injury) ([place])   Shock liver   Alcohol use   Substance use disorder  Dantavious Snowball is a 53 y.o. male with medical history significant for  uncontrolled insulin-dependent type 2 diabetes, hypertension, chronic pancreatitis with history of chronic biliary stenosis, tobacco use, alcohol use, and substance use disorder who is admitted with HHS and shock liver.   Acute liver injury/acetaminophen toxicity: Significant LFT elevations with AST 5859, ALT 2091, alk phos 165, T bili 2.3.  Suspect shock liver from significant dehydration/hypovolemia.  Obtain abdominal ultrasound.  Patient does have history of chronic pancreatitis and chronic biliary stricture with prior stenting. Underwent ERCP 06/27/2011 by Dr. Watt Climes for stent removal.  Acetaminophen level elevated to 135, will consult pharmacy for N-acetylcysteine assistance.  Unknown time or amount of ingestion as patient denies acetaminophen use. -Follow-up abdominal ultrasound -Pharmacy consult for N-acetylcysteine -Toxicology recommending 1 dose of Fomepizole, appreciate pharmacy assistance -Monitor CMET q4h, check INR and follow q12h -Check HIV, hepatitis B, hep C, hep A serologies  HHS in uncontrolled insulin-dependent type 2 diabetes: Likely secondary to nonadherence to home medications.  Will start on insulin drip with IV fluids per protocol. -Admit to progressive care unit -Supplement IV potassium -Start insulin infusion -Give 1 L normal saline bolus followed by NS @ 125/hr -Transition to D5-0.45% NS @ 100/hr with CBG <250 -Monitor CMET q4h, transition to home regimen subcutaneous insulin once anion gap closed  -Follow-up urinalysis  Acute kidney injury: Secondary to dehydration.  Continue IV fluids as above and monitor renal function.  Hypokalemia: IV repletion ordered.  Continue to monitor.  Hypertension: Holding home lisinopril with AKI.  IV hydralazine as needed.  Chronic pancreatitis: Uses Creon at home.  Holding for now while n.p.o.  Alcohol use disorder: Reported history of alcohol use disorder resulting in chronic pancreatitis.  Patient denies any recent use.   Serum ethanol level was undetectable.  Will monitor on CIWA protocol with as needed Ativan.  Substance use disorder: Patient admits to cocaine use, UDS is positive for cocaine.  Tobacco use: Nicotine patch ordered.  DVT prophylaxis: Subcutaneous heparin Code Status: DNR, confirmed with patient Family Communication: None present on admission Disposition Plan: Pending clinical progress Consults called: None, may need GI consultation pending progress Admission status: Admit - It is my clinical opinion that admission to INPATIENT is reasonable and necessary because of the expectation that this patient will require hospital care that crosses at least 2 midnights to treat this condition based on the medical complexity of  the problems presented.  Given the aforementioned information, the predictability of an adverse outcome is felt to be significant.      Zada Finders MD [facility]  If 7PM-7AM, please contact night-coverage [email or web address]  02/18/2019, 11:47 PM

## 2019-02-18 NOTE — ED Notes (Signed)
Date and time results received: 02/18/19 2040 (use smartphrase ".now" to insert current time)  Test: CBG Critical Value: 407  Name of Provider Notified: Dr. Vanita Panda  Orders Received? Or Actions Taken?: Orders Received - See Orders for details   Fluids, insulin

## 2019-02-18 NOTE — ED Triage Notes (Signed)
Patient arrived with EMS from home reports elevated blood glucose 526 today with emesis/diarrhea , generalized weakness and concentrated "red" urine , history of DM .

## 2019-02-19 ENCOUNTER — Inpatient Hospital Stay (HOSPITAL_COMMUNITY): Payer: Self-pay

## 2019-02-19 ENCOUNTER — Other Ambulatory Visit: Payer: Self-pay

## 2019-02-19 LAB — COMPREHENSIVE METABOLIC PANEL
ALT: 3358 U/L — ABNORMAL HIGH (ref 0–44)
ALT: 3505 U/L — ABNORMAL HIGH (ref 0–44)
ALT: 3650 U/L — ABNORMAL HIGH (ref 0–44)
ALT: 4408 U/L — ABNORMAL HIGH (ref 0–44)
ALT: 4565 U/L — ABNORMAL HIGH (ref 0–44)
AST: 9548 U/L — ABNORMAL HIGH (ref 15–41)
AST: >10000 U/L — ABNORMAL HIGH (ref 15–41)
AST: >10000 U/L — ABNORMAL HIGH (ref 15–41)
AST: >10000 U/L — ABNORMAL HIGH (ref 15–41)
AST: >10000 U/L — ABNORMAL HIGH (ref 15–41)
Albumin: 3.3 g/dL — ABNORMAL LOW (ref 3.5–5.0)
Albumin: 3.2 g/dL — ABNORMAL LOW (ref 3.5–5.0)
Albumin: 2.6 g/dL — ABNORMAL LOW (ref 3.5–5.0)
Albumin: 2.7 g/dL — ABNORMAL LOW (ref 3.5–5.0)
Albumin: 2.8 g/dL — ABNORMAL LOW (ref 3.5–5.0)
Alkaline Phosphatase: 160 U/L — ABNORMAL HIGH (ref 38–126)
Alkaline Phosphatase: 155 U/L — ABNORMAL HIGH (ref 38–126)
Alkaline Phosphatase: 133 U/L — ABNORMAL HIGH (ref 38–126)
Alkaline Phosphatase: 150 U/L — ABNORMAL HIGH (ref 38–126)
Alkaline Phosphatase: 159 U/L — ABNORMAL HIGH (ref 38–126)
Anion gap: 16 — ABNORMAL HIGH (ref 5–15)
Anion gap: 18 — ABNORMAL HIGH (ref 5–15)
Anion gap: 15 (ref 5–15)
Anion gap: 13 (ref 5–15)
Anion gap: 10 (ref 5–15)
BUN: 28 mg/dL — ABNORMAL HIGH (ref 6–20)
BUN: 28 mg/dL — ABNORMAL HIGH (ref 6–20)
BUN: 28 mg/dL — ABNORMAL HIGH (ref 6–20)
BUN: 29 mg/dL — ABNORMAL HIGH (ref 6–20)
BUN: 29 mg/dL — ABNORMAL HIGH (ref 6–20)
CO2: 28 mmol/L (ref 22–32)
CO2: 28 mmol/L (ref 22–32)
CO2: 27 mmol/L (ref 22–32)
CO2: 27 mmol/L (ref 22–32)
CO2: 28 mmol/L (ref 22–32)
Calcium: 8.5 mg/dL — ABNORMAL LOW (ref 8.9–10.3)
Calcium: 8.2 mg/dL — ABNORMAL LOW (ref 8.9–10.3)
Calcium: 7.8 mg/dL — ABNORMAL LOW (ref 8.9–10.3)
Calcium: 8 mg/dL — ABNORMAL LOW (ref 8.9–10.3)
Calcium: 8 mg/dL — ABNORMAL LOW (ref 8.9–10.3)
Chloride: 104 mmol/L (ref 98–111)
Chloride: 104 mmol/L (ref 98–111)
Chloride: 108 mmol/L (ref 98–111)
Chloride: 110 mmol/L (ref 98–111)
Chloride: 112 mmol/L — ABNORMAL HIGH (ref 98–111)
Creatinine, Ser: 1.74 mg/dL — ABNORMAL HIGH (ref 0.61–1.24)
Creatinine, Ser: 1.6 mg/dL — ABNORMAL HIGH (ref 0.61–1.24)
Creatinine, Ser: 1.45 mg/dL — ABNORMAL HIGH (ref 0.61–1.24)
Creatinine, Ser: 1.48 mg/dL — ABNORMAL HIGH (ref 0.61–1.24)
Creatinine, Ser: 1.52 mg/dL — ABNORMAL HIGH (ref 0.61–1.24)
GFR calc Af Amer: 50 mL/min — ABNORMAL LOW (ref >60)
GFR calc Af Amer: 56 mL/min — ABNORMAL LOW (ref >60)
GFR calc Af Amer: >60 mL/min (ref >60)
GFR calc Af Amer: >60 mL/min (ref >60)
GFR calc Af Amer: 59 mL/min — ABNORMAL LOW (ref >60)
GFR calc non Af Amer: 43 mL/min — ABNORMAL LOW (ref >60)
GFR calc non Af Amer: 48 mL/min — ABNORMAL LOW (ref >60)
GFR calc non Af Amer: 54 mL/min — ABNORMAL LOW (ref >60)
GFR calc non Af Amer: 53 mL/min — ABNORMAL LOW (ref >60)
GFR calc non Af Amer: 51 mL/min — ABNORMAL LOW (ref >60)
Glucose, Bld: 299 mg/dL — ABNORMAL HIGH (ref 70–99)
Glucose, Bld: 306 mg/dL — ABNORMAL HIGH (ref 70–99)
Glucose, Bld: 222 mg/dL — ABNORMAL HIGH (ref 70–99)
Glucose, Bld: 105 mg/dL — ABNORMAL HIGH (ref 70–99)
Glucose, Bld: 91 mg/dL (ref 70–99)
Potassium: 3 mmol/L — ABNORMAL LOW (ref 3.5–5.1)
Potassium: 3.3 mmol/L — ABNORMAL LOW (ref 3.5–5.1)
Potassium: 3.2 mmol/L — ABNORMAL LOW (ref 3.5–5.1)
Potassium: 3.3 mmol/L — ABNORMAL LOW (ref 3.5–5.1)
Potassium: 3.3 mmol/L — ABNORMAL LOW (ref 3.5–5.1)
Sodium: 148 mmol/L — ABNORMAL HIGH (ref 135–145)
Sodium: 150 mmol/L — ABNORMAL HIGH (ref 135–145)
Sodium: 150 mmol/L — ABNORMAL HIGH (ref 135–145)
Sodium: 150 mmol/L — ABNORMAL HIGH (ref 135–145)
Sodium: 150 mmol/L — ABNORMAL HIGH (ref 135–145)
Total Bilirubin: 2.5 mg/dL — ABNORMAL HIGH (ref 0.3–1.2)
Total Bilirubin: 2.5 mg/dL — ABNORMAL HIGH (ref 0.3–1.2)
Total Bilirubin: 2 mg/dL — ABNORMAL HIGH (ref 0.3–1.2)
Total Bilirubin: 2.2 mg/dL — ABNORMAL HIGH (ref 0.3–1.2)
Total Bilirubin: 2.4 mg/dL — ABNORMAL HIGH (ref 0.3–1.2)
Total Protein: 6.3 g/dL — ABNORMAL LOW (ref 6.5–8.1)
Total Protein: 5.9 g/dL — ABNORMAL LOW (ref 6.5–8.1)
Total Protein: 5 g/dL — ABNORMAL LOW (ref 6.5–8.1)
Total Protein: 5.3 g/dL — ABNORMAL LOW (ref 6.5–8.1)
Total Protein: 5.6 g/dL — ABNORMAL LOW (ref 6.5–8.1)

## 2019-02-19 LAB — GLUCOSE, CAPILLARY
Glucose-Capillary: 103 mg/dL — ABNORMAL HIGH (ref 70–99)
Glucose-Capillary: 108 mg/dL — ABNORMAL HIGH (ref 70–99)
Glucose-Capillary: 127 mg/dL — ABNORMAL HIGH (ref 70–99)
Glucose-Capillary: 152 mg/dL — ABNORMAL HIGH (ref 70–99)
Glucose-Capillary: 175 mg/dL — ABNORMAL HIGH (ref 70–99)
Glucose-Capillary: 179 mg/dL — ABNORMAL HIGH (ref 70–99)
Glucose-Capillary: 183 mg/dL — ABNORMAL HIGH (ref 70–99)
Glucose-Capillary: 217 mg/dL — ABNORMAL HIGH (ref 70–99)
Glucose-Capillary: 243 mg/dL — ABNORMAL HIGH (ref 70–99)
Glucose-Capillary: 245 mg/dL — ABNORMAL HIGH (ref 70–99)
Glucose-Capillary: 255 mg/dL — ABNORMAL HIGH (ref 70–99)
Glucose-Capillary: 82 mg/dL (ref 70–99)
Glucose-Capillary: 88 mg/dL (ref 70–99)
Glucose-Capillary: 88 mg/dL (ref 70–99)
Glucose-Capillary: 96 mg/dL (ref 70–99)

## 2019-02-19 LAB — PROTIME-INR
INR: 3 — ABNORMAL HIGH (ref 0.8–1.2)
INR: 4.4 (ref 0.8–1.2)
Prothrombin Time: 30.6 seconds — ABNORMAL HIGH (ref 11.4–15.2)
Prothrombin Time: 41 seconds — ABNORMAL HIGH (ref 11.4–15.2)

## 2019-02-19 LAB — CBC
HCT: 41.2 % (ref 39.0–52.0)
Hemoglobin: 13.6 g/dL (ref 13.0–17.0)
MCH: 29.6 pg (ref 26.0–34.0)
MCHC: 33 g/dL (ref 30.0–36.0)
MCV: 89.6 fL (ref 80.0–100.0)
Platelets: 183 10*3/uL (ref 150–400)
RBC: 4.6 MIL/uL (ref 4.22–5.81)
RDW: 13.2 % (ref 11.5–15.5)
WBC: 10.2 10*3/uL (ref 4.0–10.5)
nRBC: 0 % (ref 0.0–0.2)

## 2019-02-19 LAB — MRSA PCR SCREENING: MRSA by PCR: NEGATIVE

## 2019-02-19 LAB — ACETAMINOPHEN LEVEL
Acetaminophen (Tylenol), Serum: 76 ug/mL — ABNORMAL HIGH (ref 10–30)
Acetaminophen (Tylenol), Serum: 43 ug/mL — ABNORMAL HIGH (ref 10–30)

## 2019-02-19 LAB — MAGNESIUM: Magnesium: 2.8 mg/dL — ABNORMAL HIGH (ref 1.7–2.4)

## 2019-02-19 LAB — AMMONIA: Ammonia: 59 umol/L — ABNORMAL HIGH (ref 9–35)

## 2019-02-19 LAB — HEMOGLOBIN A1C
Hgb A1c MFr Bld: 17.7 % — ABNORMAL HIGH (ref 4.8–5.6)
Mean Plasma Glucose: 461.29 mg/dL

## 2019-02-19 LAB — HIV ANTIBODY (ROUTINE TESTING W REFLEX): HIV Screen 4th Generation wRfx: NONREACTIVE

## 2019-02-19 LAB — CBG MONITORING, ED: Glucose-Capillary: 294 mg/dL — ABNORMAL HIGH (ref 70–99)

## 2019-02-19 MED ORDER — LACTULOSE ENEMA
300.0000 mL | Freq: Two times a day (BID) | ORAL | Status: DC
  Administered 2019-02-19 – 2019-02-23 (×9): 300 mL via RECTAL
  Filled 2019-02-19 (×11): qty 300

## 2019-02-19 MED ORDER — CHLORHEXIDINE GLUCONATE 0.12 % MT SOLN
15.0000 mL | Freq: Two times a day (BID) | OROMUCOSAL | Status: DC
  Administered 2019-02-19 – 2019-03-06 (×30): 15 mL via OROMUCOSAL
  Filled 2019-02-19 (×30): qty 15

## 2019-02-19 MED ORDER — INSULIN ASPART 100 UNIT/ML ~~LOC~~ SOLN
0.0000 [IU] | Freq: Three times a day (TID) | SUBCUTANEOUS | Status: DC
  Administered 2019-02-20 – 2019-02-22 (×4): 1 [IU] via SUBCUTANEOUS
  Administered 2019-02-23: 5 [IU] via SUBCUTANEOUS
  Administered 2019-02-23: 2 [IU] via SUBCUTANEOUS
  Administered 2019-02-23: 3 [IU] via SUBCUTANEOUS
  Administered 2019-02-24: 5 [IU] via SUBCUTANEOUS
  Administered 2019-02-24 (×2): 2 [IU] via SUBCUTANEOUS
  Administered 2019-02-25: 7 [IU] via SUBCUTANEOUS
  Administered 2019-02-25: 5 [IU] via SUBCUTANEOUS
  Administered 2019-02-25: 7 [IU] via SUBCUTANEOUS
  Administered 2019-02-26: 5 [IU] via SUBCUTANEOUS
  Administered 2019-02-26: 3 [IU] via SUBCUTANEOUS
  Administered 2019-02-26: 7 [IU] via SUBCUTANEOUS
  Administered 2019-02-27 (×2): 9 [IU] via SUBCUTANEOUS
  Administered 2019-02-28: 1 [IU] via SUBCUTANEOUS
  Administered 2019-02-28 – 2019-03-01 (×2): 2 [IU] via SUBCUTANEOUS
  Administered 2019-03-01: 5 [IU] via SUBCUTANEOUS
  Administered 2019-03-01: 2 [IU] via SUBCUTANEOUS
  Administered 2019-03-02: 3 [IU] via SUBCUTANEOUS
  Administered 2019-03-02: 2 [IU] via SUBCUTANEOUS
  Administered 2019-03-03 (×2): 7 [IU] via SUBCUTANEOUS
  Administered 2019-03-03: 2 [IU] via SUBCUTANEOUS
  Administered 2019-03-04 (×2): 3 [IU] via SUBCUTANEOUS
  Administered 2019-03-04: 7 [IU] via SUBCUTANEOUS
  Administered 2019-03-05: 3 [IU] via SUBCUTANEOUS
  Administered 2019-03-05: 5 [IU] via SUBCUTANEOUS
  Administered 2019-03-06: 2 [IU] via SUBCUTANEOUS
  Administered 2019-03-06: 12:00:00 5 [IU] via SUBCUTANEOUS
  Administered 2019-03-06: 3 [IU] via SUBCUTANEOUS

## 2019-02-19 MED ORDER — VITAMIN K1 10 MG/ML IJ SOLN
5.0000 mg | Freq: Once | INTRAVENOUS | Status: AC
  Administered 2019-02-19: 5 mg via INTRAVENOUS
  Filled 2019-02-19: qty 0.5

## 2019-02-19 MED ORDER — CLONIDINE HCL 0.1 MG/24HR TD PTWK
0.1000 mg | MEDICATED_PATCH | TRANSDERMAL | Status: DC
  Administered 2019-02-19: 0.1 mg via TRANSDERMAL
  Filled 2019-02-19: qty 1

## 2019-02-19 MED ORDER — INSULIN GLARGINE 100 UNIT/ML ~~LOC~~ SOLN
15.0000 [IU] | Freq: Every day | SUBCUTANEOUS | Status: DC
  Administered 2019-02-19: 15 [IU] via SUBCUTANEOUS
  Filled 2019-02-19 (×2): qty 0.15

## 2019-02-19 MED ORDER — SODIUM CHLORIDE 0.9 % IV SOLN
15.0000 mg/kg | Freq: Once | INTRAVENOUS | Status: AC
  Administered 2019-02-19: 690 mg via INTRAVENOUS
  Filled 2019-02-19: qty 0.69

## 2019-02-19 MED ORDER — SODIUM CHLORIDE 0.9 % IV SOLN
15.0000 mg/kg | Freq: Once | INTRAVENOUS | Status: DC
  Filled 2019-02-19: qty 0.92

## 2019-02-19 MED ORDER — PROMETHAZINE HCL 25 MG/ML IJ SOLN
12.5000 mg | Freq: Once | INTRAMUSCULAR | Status: AC
  Administered 2019-02-19: 12.5 mg via INTRAVENOUS
  Filled 2019-02-19: qty 1

## 2019-02-19 MED ORDER — DEXTROSE 5 % IV SOLN
15.0000 mg/kg/h | INTRAVENOUS | Status: DC
  Administered 2019-02-19 – 2019-02-21 (×3): 15 mg/kg/h via INTRAVENOUS
  Filled 2019-02-19 (×7): qty 60

## 2019-02-19 MED ORDER — ORAL CARE MOUTH RINSE
15.0000 mL | Freq: Two times a day (BID) | OROMUCOSAL | Status: DC
  Administered 2019-02-20 – 2019-03-06 (×22): 15 mL via OROMUCOSAL

## 2019-02-19 MED ORDER — POTASSIUM CHLORIDE 10 MEQ/100ML IV SOLN
10.0000 meq | INTRAVENOUS | Status: AC
  Administered 2019-02-19 (×6): 10 meq via INTRAVENOUS
  Filled 2019-02-19 (×6): qty 100

## 2019-02-19 NOTE — Progress Notes (Signed)
PROGRESS NOTE    Thermon Zulauf  NKN:[id number] DOB: [birth year] DOA: 02/18/2019 PCP: Gildardo Pounds, NP    Brief Narrative:   Rodney Barber is a 53 y.o. male with medical history significant for uncontrolled insulin-dependent type 2 diabetes, hypertension, chronic pancreatitis with history of chronic biliary stenosis, tobacco use, alcohol use, and substance use disorder who presents to the ED for evaluation of persistent nausea and vomiting.  Patient states he had sudden onset of persistent nausea and vomiting beginning 2 days ago.  He says anytime he tries to drink fluids he would have immediate emesis.  He has not been able to keep anything down by mouth.  He is associated abdominal pain which occurs only with his episodes of emesis.  He reports having some tremors and diaphoresis.  He denies any chest pain, palpitations, or dyspnea.  He reports good urine output.  He denies any diarrhea.  He does admit that he has been missing his home insulin doses recently.  He denies any Tylenol use.  He reports smoking 2 packs/week.  He denies any alcohol use.  He reports occasional cocaine use with last use about 3 days ago.  ED Course:  Initial vitals showed BP 166/69, pulse 84, RR 20, temp 98.9 Fahrenheit, SPO2 98% on room air.  Labs are notable for serum glucose 517, sodium 143 (153 when corrected for hyperglycemia), potassium 3.4, bicarb 19, BUN 30, creatinine 2.44, albumin 3.3.WBC 11.5, hemoglobin 12.7, platelets 233,000, serum ethanol level undetectable. VBG showed pH 7.493, PCO2 30.7, PO2 71.  AST 5859, ALT 2091, alk phos 165, total bilirubin 2.3.Lipase 19.  Patient was given 1 L normal saline and 10 units NovoLog IV.  Abdominal ultrasound was ordered and pending.  The hospitalist service was consulted to admit for further evaluation and management.   Assessment & Plan:   Principal Problem:   Hyperosmolar non-ketotic state in patient with type 2 diabetes mellitus ([place]) Active  Problems:   Chronic pancreatitis ([place])   Hypokalemia   AKI (acute kidney injury) ([place])   Shock liver   Alcohol use   Substance use disorder   Acute metabolic encephalopathy Etiology likely multifactorial with acute liver failure, DKA, acute renal failure, and substance abuse with cocaine and EtOH with likely withdrawal symptoms.  Also with elevated ammonia level, suspect some mild hepatic encephalopathy. --Continue treatment for acute liver failure/acetaminophen toxicity as below --Insulin drip for DKA --Start lactulose enemas BID; goal 2-3 soft BMs daily --CIWAA for EtOH withdrawal  Acute liver injury/acetaminophen toxicity: Significant LFT elevations with AST 5859, ALT 2091, alk phos 165, T bili 2.3.  Suspect shock liver from significant dehydration/hypovolemia versus acetaminophen toxicity. Patient does have history of chronic pancreatitis and chronic biliary stricture with prior stenting. Underwent ERCP 06/27/2011 by Dr. Watt Climes for stent removal.  Acetaminophen level elevated to 135. Unknown time or amount of ingestion as patient denies acetaminophen use.  Abdominal ultrasound with cholelithiasis and borderline gallbladder wall thickening, mildly dilated CBD, trace pericholecystic fluid and negative Murphy sign. Lipase 19. --AST 5,859, 9,548, >10,000 --ALT 2, 091, 3,358, 3,505 --Tbili 2.3, 2.5, 2.5 --INR 3.0,  --ammonia level 59 --s/p fomepazole --s/p N-acetylcysteine bolus, continues N-acetylcysteine infusion --Monitor CMET, INR, Tylenol level q12h -Check HIV, hepatitis B, hep C, hep A serologies  DKA in uncontrolled insulin-dependent type 2 diabetes: High anion gap metabolic acidosis Likely secondary to nonadherence to home medications; which include glipizide 5 mg p.o. twice daily and Levemir 25 units [place] qHS. Hemoglobin A1c 17.7, urinalysis with 5 ketones,  PROGRESS NOTE    Thermon Zulauf  NKN:[id number] DOB: [birth year] DOA: 02/18/2019 PCP: Gildardo Pounds, NP    Brief Narrative:   Rodney Barber is a 53 y.o. male with medical history significant for uncontrolled insulin-dependent type 2 diabetes, hypertension, chronic pancreatitis with history of chronic biliary stenosis, tobacco use, alcohol use, and substance use disorder who presents to the ED for evaluation of persistent nausea and vomiting.  Patient states he had sudden onset of persistent nausea and vomiting beginning 2 days ago.  He says anytime he tries to drink fluids he would have immediate emesis.  He has not been able to keep anything down by mouth.  He is associated abdominal pain which occurs only with his episodes of emesis.  He reports having some tremors and diaphoresis.  He denies any chest pain, palpitations, or dyspnea.  He reports good urine output.  He denies any diarrhea.  He does admit that he has been missing his home insulin doses recently.  He denies any Tylenol use.  He reports smoking 2 packs/week.  He denies any alcohol use.  He reports occasional cocaine use with last use about 3 days ago.  ED Course:  Initial vitals showed BP 166/69, pulse 84, RR 20, temp 98.9 Fahrenheit, SPO2 98% on room air.  Labs are notable for serum glucose 517, sodium 143 (153 when corrected for hyperglycemia), potassium 3.4, bicarb 19, BUN 30, creatinine 2.44, albumin 3.3.WBC 11.5, hemoglobin 12.7, platelets 233,000, serum ethanol level undetectable. VBG showed pH 7.493, PCO2 30.7, PO2 71.  AST 5859, ALT 2091, alk phos 165, total bilirubin 2.3.Lipase 19.  Patient was given 1 L normal saline and 10 units NovoLog IV.  Abdominal ultrasound was ordered and pending.  The hospitalist service was consulted to admit for further evaluation and management.   Assessment & Plan:   Principal Problem:   Hyperosmolar non-ketotic state in patient with type 2 diabetes mellitus ([place]) Active  Problems:   Chronic pancreatitis ([place])   Hypokalemia   AKI (acute kidney injury) ([place])   Shock liver   Alcohol use   Substance use disorder   Acute metabolic encephalopathy Etiology likely multifactorial with acute liver failure, DKA, acute renal failure, and substance abuse with cocaine and EtOH with likely withdrawal symptoms.  Also with elevated ammonia level, suspect some mild hepatic encephalopathy. --Continue treatment for acute liver failure/acetaminophen toxicity as below --Insulin drip for DKA --Start lactulose enemas BID; goal 2-3 soft BMs daily --CIWAA for EtOH withdrawal  Acute liver injury/acetaminophen toxicity: Significant LFT elevations with AST 5859, ALT 2091, alk phos 165, T bili 2.3.  Suspect shock liver from significant dehydration/hypovolemia versus acetaminophen toxicity. Patient does have history of chronic pancreatitis and chronic biliary stricture with prior stenting. Underwent ERCP 06/27/2011 by Dr. Watt Climes for stent removal.  Acetaminophen level elevated to 135. Unknown time or amount of ingestion as patient denies acetaminophen use.  Abdominal ultrasound with cholelithiasis and borderline gallbladder wall thickening, mildly dilated CBD, trace pericholecystic fluid and negative Murphy sign. Lipase 19. --AST 5,859, 9,548, >10,000 --ALT 2, 091, 3,358, 3,505 --Tbili 2.3, 2.5, 2.5 --INR 3.0,  --ammonia level 59 --s/p fomepazole --s/p N-acetylcysteine bolus, continues N-acetylcysteine infusion --Monitor CMET, INR, Tylenol level q12h -Check HIV, hepatitis B, hep C, hep A serologies  DKA in uncontrolled insulin-dependent type 2 diabetes: High anion gap metabolic acidosis Likely secondary to nonadherence to home medications; which include glipizide 5 mg p.o. twice daily and Levemir 25 units [place] qHS. Hemoglobin A1c 17.7, urinalysis with 5 ketones,  PROGRESS NOTE    Thermon Zulauf  NKN:[id number] DOB: [birth year] DOA: 02/18/2019 PCP: Gildardo Pounds, NP    Brief Narrative:   Rodney Barber is a 53 y.o. male with medical history significant for uncontrolled insulin-dependent type 2 diabetes, hypertension, chronic pancreatitis with history of chronic biliary stenosis, tobacco use, alcohol use, and substance use disorder who presents to the ED for evaluation of persistent nausea and vomiting.  Patient states he had sudden onset of persistent nausea and vomiting beginning 2 days ago.  He says anytime he tries to drink fluids he would have immediate emesis.  He has not been able to keep anything down by mouth.  He is associated abdominal pain which occurs only with his episodes of emesis.  He reports having some tremors and diaphoresis.  He denies any chest pain, palpitations, or dyspnea.  He reports good urine output.  He denies any diarrhea.  He does admit that he has been missing his home insulin doses recently.  He denies any Tylenol use.  He reports smoking 2 packs/week.  He denies any alcohol use.  He reports occasional cocaine use with last use about 3 days ago.  ED Course:  Initial vitals showed BP 166/69, pulse 84, RR 20, temp 98.9 Fahrenheit, SPO2 98% on room air.  Labs are notable for serum glucose 517, sodium 143 (153 when corrected for hyperglycemia), potassium 3.4, bicarb 19, BUN 30, creatinine 2.44, albumin 3.3.WBC 11.5, hemoglobin 12.7, platelets 233,000, serum ethanol level undetectable. VBG showed pH 7.493, PCO2 30.7, PO2 71.  AST 5859, ALT 2091, alk phos 165, total bilirubin 2.3.Lipase 19.  Patient was given 1 L normal saline and 10 units NovoLog IV.  Abdominal ultrasound was ordered and pending.  The hospitalist service was consulted to admit for further evaluation and management.   Assessment & Plan:   Principal Problem:   Hyperosmolar non-ketotic state in patient with type 2 diabetes mellitus ([place]) Active  Problems:   Chronic pancreatitis ([place])   Hypokalemia   AKI (acute kidney injury) ([place])   Shock liver   Alcohol use   Substance use disorder   Acute metabolic encephalopathy Etiology likely multifactorial with acute liver failure, DKA, acute renal failure, and substance abuse with cocaine and EtOH with likely withdrawal symptoms.  Also with elevated ammonia level, suspect some mild hepatic encephalopathy. --Continue treatment for acute liver failure/acetaminophen toxicity as below --Insulin drip for DKA --Start lactulose enemas BID; goal 2-3 soft BMs daily --CIWAA for EtOH withdrawal  Acute liver injury/acetaminophen toxicity: Significant LFT elevations with AST 5859, ALT 2091, alk phos 165, T bili 2.3.  Suspect shock liver from significant dehydration/hypovolemia versus acetaminophen toxicity. Patient does have history of chronic pancreatitis and chronic biliary stricture with prior stenting. Underwent ERCP 06/27/2011 by Dr. Watt Climes for stent removal.  Acetaminophen level elevated to 135. Unknown time or amount of ingestion as patient denies acetaminophen use.  Abdominal ultrasound with cholelithiasis and borderline gallbladder wall thickening, mildly dilated CBD, trace pericholecystic fluid and negative Murphy sign. Lipase 19. --AST 5,859, 9,548, >10,000 --ALT 2, 091, 3,358, 3,505 --Tbili 2.3, 2.5, 2.5 --INR 3.0,  --ammonia level 59 --s/p fomepazole --s/p N-acetylcysteine bolus, continues N-acetylcysteine infusion --Monitor CMET, INR, Tylenol level q12h -Check HIV, hepatitis B, hep C, hep A serologies  DKA in uncontrolled insulin-dependent type 2 diabetes: High anion gap metabolic acidosis Likely secondary to nonadherence to home medications; which include glipizide 5 mg p.o. twice daily and Levemir 25 units [place] qHS. Hemoglobin A1c 17.7, urinalysis with 5 ketones,  PROGRESS NOTE    Thermon Zulauf  NKN:[id number] DOB: [birth year] DOA: 02/18/2019 PCP: Gildardo Pounds, NP    Brief Narrative:   Rodney Barber is a 53 y.o. male with medical history significant for uncontrolled insulin-dependent type 2 diabetes, hypertension, chronic pancreatitis with history of chronic biliary stenosis, tobacco use, alcohol use, and substance use disorder who presents to the ED for evaluation of persistent nausea and vomiting.  Patient states he had sudden onset of persistent nausea and vomiting beginning 2 days ago.  He says anytime he tries to drink fluids he would have immediate emesis.  He has not been able to keep anything down by mouth.  He is associated abdominal pain which occurs only with his episodes of emesis.  He reports having some tremors and diaphoresis.  He denies any chest pain, palpitations, or dyspnea.  He reports good urine output.  He denies any diarrhea.  He does admit that he has been missing his home insulin doses recently.  He denies any Tylenol use.  He reports smoking 2 packs/week.  He denies any alcohol use.  He reports occasional cocaine use with last use about 3 days ago.  ED Course:  Initial vitals showed BP 166/69, pulse 84, RR 20, temp 98.9 Fahrenheit, SPO2 98% on room air.  Labs are notable for serum glucose 517, sodium 143 (153 when corrected for hyperglycemia), potassium 3.4, bicarb 19, BUN 30, creatinine 2.44, albumin 3.3.WBC 11.5, hemoglobin 12.7, platelets 233,000, serum ethanol level undetectable. VBG showed pH 7.493, PCO2 30.7, PO2 71.  AST 5859, ALT 2091, alk phos 165, total bilirubin 2.3.Lipase 19.  Patient was given 1 L normal saline and 10 units NovoLog IV.  Abdominal ultrasound was ordered and pending.  The hospitalist service was consulted to admit for further evaluation and management.   Assessment & Plan:   Principal Problem:   Hyperosmolar non-ketotic state in patient with type 2 diabetes mellitus ([place]) Active  Problems:   Chronic pancreatitis ([place])   Hypokalemia   AKI (acute kidney injury) ([place])   Shock liver   Alcohol use   Substance use disorder   Acute metabolic encephalopathy Etiology likely multifactorial with acute liver failure, DKA, acute renal failure, and substance abuse with cocaine and EtOH with likely withdrawal symptoms.  Also with elevated ammonia level, suspect some mild hepatic encephalopathy. --Continue treatment for acute liver failure/acetaminophen toxicity as below --Insulin drip for DKA --Start lactulose enemas BID; goal 2-3 soft BMs daily --CIWAA for EtOH withdrawal  Acute liver injury/acetaminophen toxicity: Significant LFT elevations with AST 5859, ALT 2091, alk phos 165, T bili 2.3.  Suspect shock liver from significant dehydration/hypovolemia versus acetaminophen toxicity. Patient does have history of chronic pancreatitis and chronic biliary stricture with prior stenting. Underwent ERCP 06/27/2011 by Dr. Watt Climes for stent removal.  Acetaminophen level elevated to 135. Unknown time or amount of ingestion as patient denies acetaminophen use.  Abdominal ultrasound with cholelithiasis and borderline gallbladder wall thickening, mildly dilated CBD, trace pericholecystic fluid and negative Murphy sign. Lipase 19. --AST 5,859, 9,548, >10,000 --ALT 2, 091, 3,358, 3,505 --Tbili 2.3, 2.5, 2.5 --INR 3.0,  --ammonia level 59 --s/p fomepazole --s/p N-acetylcysteine bolus, continues N-acetylcysteine infusion --Monitor CMET, INR, Tylenol level q12h -Check HIV, hepatitis B, hep C, hep A serologies  DKA in uncontrolled insulin-dependent type 2 diabetes: High anion gap metabolic acidosis Likely secondary to nonadherence to home medications; which include glipizide 5 mg p.o. twice daily and Levemir 25 units [place] qHS. Hemoglobin A1c 17.7, urinalysis with 5 ketones,  PROGRESS NOTE    Thermon Zulauf  NKN:[id number] DOB: [birth year] DOA: 02/18/2019 PCP: Gildardo Pounds, NP    Brief Narrative:   Rodney Barber is a 53 y.o. male with medical history significant for uncontrolled insulin-dependent type 2 diabetes, hypertension, chronic pancreatitis with history of chronic biliary stenosis, tobacco use, alcohol use, and substance use disorder who presents to the ED for evaluation of persistent nausea and vomiting.  Patient states he had sudden onset of persistent nausea and vomiting beginning 2 days ago.  He says anytime he tries to drink fluids he would have immediate emesis.  He has not been able to keep anything down by mouth.  He is associated abdominal pain which occurs only with his episodes of emesis.  He reports having some tremors and diaphoresis.  He denies any chest pain, palpitations, or dyspnea.  He reports good urine output.  He denies any diarrhea.  He does admit that he has been missing his home insulin doses recently.  He denies any Tylenol use.  He reports smoking 2 packs/week.  He denies any alcohol use.  He reports occasional cocaine use with last use about 3 days ago.  ED Course:  Initial vitals showed BP 166/69, pulse 84, RR 20, temp 98.9 Fahrenheit, SPO2 98% on room air.  Labs are notable for serum glucose 517, sodium 143 (153 when corrected for hyperglycemia), potassium 3.4, bicarb 19, BUN 30, creatinine 2.44, albumin 3.3.WBC 11.5, hemoglobin 12.7, platelets 233,000, serum ethanol level undetectable. VBG showed pH 7.493, PCO2 30.7, PO2 71.  AST 5859, ALT 2091, alk phos 165, total bilirubin 2.3.Lipase 19.  Patient was given 1 L normal saline and 10 units NovoLog IV.  Abdominal ultrasound was ordered and pending.  The hospitalist service was consulted to admit for further evaluation and management.   Assessment & Plan:   Principal Problem:   Hyperosmolar non-ketotic state in patient with type 2 diabetes mellitus ([place]) Active  Problems:   Chronic pancreatitis ([place])   Hypokalemia   AKI (acute kidney injury) ([place])   Shock liver   Alcohol use   Substance use disorder   Acute metabolic encephalopathy Etiology likely multifactorial with acute liver failure, DKA, acute renal failure, and substance abuse with cocaine and EtOH with likely withdrawal symptoms.  Also with elevated ammonia level, suspect some mild hepatic encephalopathy. --Continue treatment for acute liver failure/acetaminophen toxicity as below --Insulin drip for DKA --Start lactulose enemas BID; goal 2-3 soft BMs daily --CIWAA for EtOH withdrawal  Acute liver injury/acetaminophen toxicity: Significant LFT elevations with AST 5859, ALT 2091, alk phos 165, T bili 2.3.  Suspect shock liver from significant dehydration/hypovolemia versus acetaminophen toxicity. Patient does have history of chronic pancreatitis and chronic biliary stricture with prior stenting. Underwent ERCP 06/27/2011 by Dr. Watt Climes for stent removal.  Acetaminophen level elevated to 135. Unknown time or amount of ingestion as patient denies acetaminophen use.  Abdominal ultrasound with cholelithiasis and borderline gallbladder wall thickening, mildly dilated CBD, trace pericholecystic fluid and negative Murphy sign. Lipase 19. --AST 5,859, 9,548, >10,000 --ALT 2, 091, 3,358, 3,505 --Tbili 2.3, 2.5, 2.5 --INR 3.0,  --ammonia level 59 --s/p fomepazole --s/p N-acetylcysteine bolus, continues N-acetylcysteine infusion --Monitor CMET, INR, Tylenol level q12h -Check HIV, hepatitis B, hep C, hep A serologies  DKA in uncontrolled insulin-dependent type 2 diabetes: High anion gap metabolic acidosis Likely secondary to nonadherence to home medications; which include glipizide 5 mg p.o. twice daily and Levemir 25 units [place] qHS. Hemoglobin A1c 17.7, urinalysis with 5 ketones,

## 2019-02-19 NOTE — ED Notes (Signed)
ED TO INPATIENT HANDOFF REPORT  ED Nurse Name and Phone #: [phone] [name]  S Name/Age/Gender Rodney Barber Rodney Barber 53 y.o. male Room/Bed: 043C/043C  Code Status   Code Status: DNR  Home/SNF/Other Home Patient oriented to: self, place, time and situation Is this baseline? Yes   Triage Complete: Triage complete  Chief Complaint hypoglycemia  Triage Note Patient arrived with EMS from home reports elevated blood glucose 526 today with emesis/diarrhea , generalized weakness and concentrated "red" urine , history of DM .   Allergies No Known Allergies  Level of Care/Admitting Diagnosis ED Disposition    ED Disposition Condition Comment   Admit  [facility] [[id number]]  Level of Care: Progressive [102]  Covid Evaluation: Asymptomatic Screening Protocol (No Symptoms)  Diagnosis: Shock liver [321842]  Admitting Physician: Charlsie QuestEL, VISHAL R [[id number]]  Attending Physician: Charlsie QuestPATEL, VISHAL R [[id number]]  Estimated length of stay: past midnight tomorrow  Certification:: I certify this patient will need inpatient services for at least 2 midnights  PT Class (Do Not Modify): Inpatient [101]  PT Acc Code (Do Not Modify): Private [1]       B Medical/Surgery History Past Medical History:  Diagnosis Date  . Diabetes mellitus   . Hypertension   . Pancreatitis    Past Surgical History:  Procedure Laterality Date  . ERCP  06/27/2011   Procedure: ENDOSCOPIC RETROGRADE CHOLANGIOPANCREATOGRAPHY (ERCP);  Surgeon: Petra KubaMarc E Magod, MD;  [facility];  Service: Endoscopy;  Laterality: N/A;  . ERCP W/ PLASTIC STENT PLACEMENT  03/2011     A IV Location/Drains/Wounds Patient Lines/Drains/Airways Status   Active Line/Drains/Airways    Name:   Placement date:   Placement time:   Site:   Days:   Peripheral IV 02/18/19 Left Forearm   02/18/19    1950    Forearm   1   Peripheral IV 02/18/19 Right Forearm   02/18/19    2350    Forearm   1           Intake/Output Last 24 hours  Intake/Output Summary (Last 24 hours) at 02/19/2019 0041 Last data filed at 02/18/2019 2337 Gross per 24 hour  Intake 1000 ml  Output -  Net 1000 ml    Labs/Imaging Results for orders placed or performed during the hospital encounter of 02/18/19 (from the past 48 hour(s))  Comprehensive metabolic panel     Status: Abnormal   Collection Time: 02/18/19  7:45 PM  Result Value Ref Range   Sodium 143 135 - 145 mmol/L   Potassium 3.4 (L) 3.5 - 5.1 mmol/L   Chloride 98 98 - 111 mmol/L   CO2 19 (L) 22 - 32 mmol/L   Glucose, Bld 517 (HH) 70 - 99 mg/dL    Comment: CRITICAL RESULT CALLED TO, READ BACK BY AND VERIFIED WITH: FLORES M,RN 02/18/19 2047 WAYK    BUN 30 (H) 6 - 20 mg/dL   Creatinine, Ser 9.562.44 (H) 0.61 - 1.24 mg/dL   Calcium 8.8 (L) 8.9 - 10.3 mg/dL   Total Protein 5.9 (L) 6.5 - 8.1 g/dL   Albumin 3.3 (L) 3.5 - 5.0 g/dL   AST 2,1305,859 (H) 15 - 41 U/L    Comment: RESULTS CONFIRMED BY MANUAL DILUTION   ALT 2,091 (H) 0 - 44 U/L   Alkaline Phosphatase 165 (H) 38 - 126 U/L   Total Bilirubin 2.3 (H) 0.3 - 1.2 mg/dL   GFR calc non Af Amer 29 (L) >60 mL/min   GFR calc Af  Amer 33 (L) >60 mL/min   Anion gap 26 (H) 5 - 15    Comment: Performed at [facility], [address] [place], [place] [address]  Lipase, blood     Status: None   Collection Time: 02/18/19  7:45 PM  Result Value Ref Range   Lipase 19 11 - 51 U/L    Comment: Performed at [facility], [address] [place], [place] [address]  CBC with Differential     Status: Abnormal   Collection Time: 02/18/19  7:45 PM  Result Value Ref Range   WBC 11.5 (H) 4.0 - 10.5 K/uL   RBC 4.21 (L) 4.22 - 5.81 MIL/uL   Hemoglobin 12.7 (L) 13.0 - 17.0 g/dL   HCT 29.537.7 (L) 62.139.0 - 30.852.0 %   MCV 89.5 80.0 - 100.0 fL   MCH 30.2 26.0 - 34.0 pg   MCHC 33.7 30.0 - 36.0 g/dL   RDW 65.713.3 84.611.5 - 96.215.5 %   Platelets 233 150 - 400 K/uL   nRBC 0.0 0.0 - 0.2 %   Neutrophils Relative % 92 %   Neutro Abs  10.6 (H) 1.7 - 7.7 K/uL   Lymphocytes Relative 6 %   Lymphs Abs 0.7 0.7 - 4.0 K/uL   Monocytes Relative 1 %   Monocytes Absolute 0.1 0.1 - 1.0 K/uL   Eosinophils Relative 0 %   Eosinophils Absolute 0.0 0.0 - 0.5 K/uL   Basophils Relative 0 %   Basophils Absolute 0.0 0.0 - 0.1 K/uL   Immature Granulocytes 1 %   Abs Immature Granulocytes 0.06 0.00 - 0.07 K/uL    Comment: Performed at [facility], [address] [place], [place] [address]  CBG monitoring, ED     Status: Abnormal   Collection Time: 02/18/19  7:49 PM  Result Value Ref Range   Glucose-Capillary 407 (H) 70 - 99 mg/dL  Ethanol     Status: None   Collection Time: 02/18/19  7:51 PM  Result Value Ref Range   Alcohol, Ethyl (B) <10 <10 mg/dL    Comment: (NOTE) Lowest detectable limit for serum alcohol is 10 mg/dL. For medical purposes only. Performed at [facility], [address] [place], [place] [address]   POCT I-Stat EG7     Status: Abnormal   Collection Time: 02/18/19  7:59 PM  Result Value Ref Range   pH, Ven 7.493 (H) 7.250 - 7.430   pCO2, Ven 30.7 (L) 44.0 - 60.0 mmHg   pO2, Ven 71.0 (H) 32.0 - 45.0 mmHg   Bicarbonate 23.6 20.0 - 28.0 mmol/L   TCO2 24 22 - 32 mmol/L   O2 Saturation 96.0 %   Acid-Base Excess 1.0 0.0 - 2.0 mmol/L   Sodium 141 135 - 145 mmol/L   Potassium 3.5 3.5 - 5.1 mmol/L   Calcium, Ion 0.97 (L) 1.15 - 1.40 mmol/L   HCT 38.0 (L) 39.0 - 52.0 %   Hemoglobin 12.9 (L) 13.0 - 17.0 g/dL   Patient temperature HIDE    Sample type VENOUS   SARS Coronavirus 2 (CEPHEID - Performed in [facility] hospital lab), Hosp Order     Status: None   Collection Time: 02/18/19 10:38 PM   Specimen: Nasopharyngeal Swab  Result Value Ref Range   SARS Coronavirus 2 NEGATIVE NEGATIVE    Comment: (NOTE) If result is NEGATIVE SARS-CoV-2 target nucleic acids are NOT DETECTED. The SARS-CoV-2 RNA is generally detectable in upper and lower  respiratory  specimens during the acute phase of infection. The  lowest  concentration of SARS-CoV-2 viral copies this assay can detect is 250  copies / mL. A negative result does not preclude SARS-CoV-2 infection  and should not be used as the sole basis for treatment or other  patient management decisions.  A negative result may occur with  improper specimen collection / handling, submission of specimen other  than nasopharyngeal swab, presence of viral mutation(s) within the  areas targeted by this assay, and inadequate number of viral copies  (<250 copies / mL). A negative result must be combined with clinical  observations, patient history, and epidemiological information. If result is POSITIVE SARS-CoV-2 target nucleic acids are DETECTED. The SARS-CoV-2 RNA is generally detectable in upper and lower  respiratory specimens dur ing the acute phase of infection.  Positive  results are indicative of active infection with SARS-CoV-2.  Clinical  correlation with patient history and other diagnostic information is  necessary to determine patient infection status.  Positive results do  not rule out bacterial infection or co-infection with other viruses. If result is PRESUMPTIVE POSTIVE SARS-CoV-2 nucleic acids MAY BE PRESENT.   A presumptive positive result was obtained on the submitted specimen  and confirmed on repeat testing.  While 2019 novel coronavirus  (SARS-CoV-2) nucleic acids may be present in the submitted sample  additional confirmatory testing may be necessary for epidemiological  and / or clinical management purposes  to differentiate between  SARS-CoV-2 and other Sarbecovirus currently known to infect humans.  If clinically indicated additional testing with an alternate test  methodology [phone]) is advised. The SARS-CoV-2 RNA is generally  detectable in upper and lower respiratory sp ecimens during the acute  phase of infection. The expected result is Negative. Fact Sheet for Patients:   BoilerBrush.com.cy Fact Sheet for Healthcare Providers: https://pope.com/ This test is not yet approved or cleared by the [place] FDA and has been authorized for detection and/or diagnosis of SARS-CoV-2 by FDA under an Emergency Use Authorization (EUA).  This EUA will remain in effect (meaning this test can be used) for the duration of the COVID-19 declaration under Section 564(b)(1) of the Act, 21 U.S.C. section 360bbb-3(b)(1), unless the authorization is terminated or revoked sooner. Performed at [facility], [address] [place], [place] [address]   Acetaminophen level     Status: Abnormal   Collection Time: 02/18/19 10:44 PM  Result Value Ref Range   Acetaminophen (Tylenol), Serum 135 (H) 10 - 30 ug/mL    Comment: (NOTE) Therapeutic concentrations vary significantly. A range of 10-30 ug/mL  may be an effective concentration for many patients. However, some  are best treated at concentrations outside of this range. Acetaminophen concentrations >150 ug/mL at 4 hours after ingestion  and >50 ug/mL at 12 hours after ingestion are often associated with  toxic reactions. Performed at [facility], [address], [place], [place] [address]   Salicylate level     Status: None   Collection Time: 02/18/19 10:44 PM  Result Value Ref Range   Salicylate Lvl 7.9 2.8 - 30.0 mg/dL    Comment: Performed at [facility], [address] [place], [place] [address]  Magnesium     Status: Abnormal   Collection Time: 02/18/19 10:50 PM  Result Value Ref Range   Magnesium 2.6 (H) 1.7 - 2.4 mg/dL    Comment: Performed at [facility], [address] [place], [place] [address]  Urinalysis, Routine  w reflex microscopic     Status: Abnormal   Collection Time: 02/18/19 11:01 PM  Result Value Ref Range   Color, Urine AMBER (A) YELLOW   APPearance HAZY (A) CLEAR   Specific Gravity, Urine 1.024 1.005 - 1.030   pH 6.0  5.0 - 8.0   Glucose, UA >=500 (A) NEGATIVE mg/dL   Hgb urine dipstick LARGE (A) NEGATIVE   Bilirubin Urine NEGATIVE NEGATIVE   Ketones, ur 5 (A) NEGATIVE mg/dL   Protein, ur 100 (A) NEGATIVE mg/dL   Nitrite NEGATIVE NEGATIVE   Leukocytes,Ua NEGATIVE NEGATIVE   RBC / HPF >50 (H) 0 - 5 RBC/hpf   WBC, UA 0-5 0 - 5 WBC/hpf   Bacteria, UA RARE (A) NONE SEEN    Comment: Performed at [facility], [address] [place], [place] [address]  Urine rapid drug screen (hosp performed)     Status: Abnormal   Collection Time: 02/18/19 11:01 PM  Result Value Ref Range   Opiates NONE DETECTED NONE DETECTED   Cocaine POSITIVE (A) NONE DETECTED   Benzodiazepines NONE DETECTED NONE DETECTED   Amphetamines NONE DETECTED NONE DETECTED   Tetrahydrocannabinol NONE DETECTED NONE DETECTED   Barbiturates NONE DETECTED NONE DETECTED    Comment: (NOTE) DRUG SCREEN FOR MEDICAL PURPOSES ONLY.  IF CONFIRMATION IS NEEDED FOR ANY PURPOSE, NOTIFY LAB WITHIN 5 DAYS. LOWEST DETECTABLE LIMITS FOR URINE DRUG SCREEN Drug Class                     Cutoff (ng/mL) Amphetamine and metabolites    1000 Barbiturate and metabolites    200 Benzodiazepine                 676 Tricyclics and metabolites     300 Opiates and metabolites        300 Cocaine and metabolites        300 THC                            50 Performed at [facility], [address], [place], [place] [address]    No results found.  Pending Labs Unresulted Labs (From admission, onward)    Start     Ordered   02/19/19 0500  CBC  Tomorrow morning,   R     02/18/19 2309   02/19/19 0500  Magnesium  Tomorrow morning,   R     02/18/19 2309   02/19/19 0500  Protime-INR  Now then every 12 hours,   R     02/18/19 2339   02/19/19 0022  Acetaminophen level  Once,   STAT     02/19/19 0022   02/18/19 2341  Comprehensive metabolic panel  Now then every 4 hours,   R     02/18/19 2340   02/18/19 2340  Protime-INR  Add-on,   AD     02/18/19  2339   02/18/19 2309  Hepatitis B surface antigen  Add-on,   AD     02/18/19 2309   02/18/19 2309  Hepatitis B surface antibody  Add-on,   AD     02/18/19 2309   02/18/19 2309  Hepatitis B core antibody, IgM  Add-on,   AD     02/18/19 2309   02/18/19 2309  Hepatitis A antibody, IgM  Add-on,   AD     02/18/19 2309   02/18/19 2308  HIV Antibody (routine testing w  rflx)  Add-on,   AD     02/18/19 2309   02/18/19 2308  Hepatitis C antibody  Add-on,   AD     02/18/19 2309   02/18/19 2307  Hemoglobin A1c  Add-on,   AD    Comments: To assess prior glycemic control.    02/18/19 2307   02/18/19 1945  Blood gas, venous  Once,   STAT     02/18/19 1944          Vitals/Pain Today's Vitals   02/18/19 1950 02/18/19 2000 02/18/19 2015 02/18/19 2030  BP:  (!) 166/69 (!) 174/92 (!) 174/75  Pulse:  84 86 79  Resp:  20 (!) 23 17  Temp:      TempSrc:      SpO2:  98% 100% 100%  Weight:      Height:      PainSc: 0-No pain       Isolation Precautions No active isolations  Medications Medications  0.9 %  sodium chloride infusion (has no administration in time range)  0.9 %  sodium chloride infusion (has no administration in time range)  dextrose 5 %-0.45 % sodium chloride infusion (has no administration in time range)  insulin regular, human (MYXREDLIN) 100 units/ 100 mL infusion (has no administration in time range)  heparin injection 5,000 Units (has no administration in time range)  potassium chloride 10 mEq in 100 mL IVPB (has no administration in time range)  nicotine (NICODERM CQ - dosed in mg/24 hours) patch 14 mg (has no administration in time range)  LORazepam (ATIVAN) injection 2-3 mg (has no administration in time range)  folic acid (FOLVITE) tablet 1 mg (has no administration in time range)  multivitamin with minerals tablet 1 tablet (has no administration in time range)  thiamine (VITAMIN B-1) tablet 100 mg (has no administration in time range)  acetylcysteine (ACETADOTE) 40  mg/mL load via infusion 9,180 mg (has no administration in time range)    Followed by  acetylcysteine (ACETADOTE) 24,000 mg in dextrose 5 % 600 mL (40 mg/mL) infusion (has no administration in time range)  hydrALAZINE (APRESOLINE) injection 5 mg (has no administration in time range)  promethazine (PHENERGAN) injection 6.25 mg (has no administration in time range)  fomepizole (ANTIZOL) 920 mg in sodium chloride 0.9 % 100 mL IVPB (has no administration in time range)  sodium chloride 0.9 % bolus 1,000 mL (0 mLs Intravenous Stopped 02/18/19 2129)  sodium chloride 0.9 % bolus 1,000 mL (0 mLs Intravenous Stopped 02/18/19 2337)  insulin aspart (novoLOG) injection 10 Units (10 Units Intravenous Given 02/18/19 2232)    Mobility walks with device Low fall risk   Focused Assessments Gastrointestinal   R Recommendations: See Admitting Provider Note  Report given to:   Additional Notes: Pt to be started on insulin drip prior to arriving to unit.  2nd IV placed.  Pt is A&O x4

## 2019-02-19 NOTE — ED Notes (Signed)
Spoke to Dr. Sherrye Payor regarding insulin drip and other IV medications as ordered.  Pt had CBG of 294.  Per Dr. Posey Pronto, insulin drip to be held.  Pt has access x2.  Potassium and Acetylcisteine IV most important to get started so pt can go to the unit.

## 2019-02-19 NOTE — Progress Notes (Signed)
Patient confused, repeatedly removing gown, removing tele leads, pulling at IV lines, getting cords and lines wrapped around him, trying to get oob. This RN and Chioma, NT attempted to reorient patient and calm patient with no success. Patient not answering questions, not following commands. This RN attempted to assist patient with urinal with no success. This RN and Chioma, NT attempted to assist patient to [facility], but he would not follow commands. Patient vomited extra-large amount of brown liquid with large amount of bright red liquid. Ronalee Belts Charge RN aware. Schorr, NP notified. Waunita Schooner, RRT RN saw patient, no interventions per RRT except third IV inserted. This RN and Chioma, NT bathed patient and changed linen. Phenergan given IV per one time does. Soft wrist and ankle restraints applied to patient. Patient still getting tele leads off, but not as often. Per Schorr, NP continue to monitor vomiting. HOB is at least 45 degrees to attempt to prevent aspiration. Will continue to monitor.

## 2019-02-19 NOTE — Progress Notes (Signed)
Weight from ED documented 61 kg. Patient is 46.3 kg per standing scale. Pharmacy called for alterations of IV medications that are weight based. Schorr, NP notified. Schorr, NP paged for verification of IV medications. Per Schorr, NP Potassium stopped. Acetylcysteine and Fomepizole to be given. Insulin IV gtt to start after Fomepizole complete. Pharmacy to send Fomepizole with correct dosing. Will continue to monitor.

## 2019-02-19 NOTE — Progress Notes (Addendum)
1610: CIWA 15. Patient is confused and pulling off medical equipment.   1021: Patient is confused. Unable to obtain additional IV access. Patient pulling off tele leads, monitor on standby. Patient trying to pull off restraints. Non redirectable.   1022: Call back to poison control. Spoke with [employer]. Updated on condition. Number is [phone]. Message sent to MD to inform that insulin drip is currently on pause. Patient has two IVs, most meds not compatible.   1044: Insulin restarted. Unable to hang Vit K run as patient currently has four channels. Will have to hang after Kruns finish.  1620: Patient bladder scan 774ml. Patient to MD. Labs also in. Last BG 88  1640: Page to MD. Patient congested, coughing up blood, lung sounds rhonchi.   1700: Page to MD. Patient foley inserted. 983ml dark, ruby red blood returned.   1712: Call to brother Merry Proud. Updated on patient condition.  1745: MD paged to inform of PT and INR critical results.   1908: Insulin drip stopped.   1934: Page to MD. Patient family wants updates. Family at bedside.

## 2019-02-19 NOTE — Significant Event (Signed)
Rapid Response Event Note  Overview: Follow Up  Initial Focused Assessment: I went up to see Mr. Bewley as a follow-up. Patient was having periods of agitation and confusion, moves all, oral secretions - dark red were pooling in his mouth, I suctioned his oral airway. Currently in 4 - point restraints. HR 90s, SBP 170s, 99% on RA, RR 18-22. Lung sounds diminished throughout. CIWA 13. Currently on Insulin Drip and NAC Drip. Receiving IV Potassium as well.  Interventions: -- Ativan 2 mg IV per CIWA order set  -- PIV that was infusing NAC had been pulled out and infiltrated. I started a 22G RLFA and NAC infusion was restared.  Plan of Care: -- Monitor and treat CIWA scores as ordered and as needed.  -- Keep HOB < 30  -- Aspiration Precautions   Event Summary:  Start Time 1135 End Time 1205  Harshan Kearley R

## 2019-02-19 NOTE — Plan of Care (Signed)
Unable to provide education d/t current medical condition.   Problem: Education: Goal: Knowledge of General Education information will improve Description: Including pain rating scale, medication(s)/side effects and non-pharmacologic comfort measures Outcome: Not Progressing   Problem: Health Behavior/Discharge Planning: Goal: Ability to manage health-related needs will improve Outcome: Not Progressing   Problem: Clinical Measurements: Goal: Ability to maintain clinical measurements within normal limits will improve Outcome: Not Progressing Goal: Will remain free from infection Outcome: Not Progressing Goal: Diagnostic test results will improve Outcome: Not Progressing Goal: Respiratory complications will improve Outcome: Not Progressing Goal: Cardiovascular complication will be avoided Outcome: Not Progressing   Problem: Activity: Goal: Risk for activity intolerance will decrease Outcome: Not Progressing   Problem: Nutrition: Goal: Adequate nutrition will be maintained Outcome: Not Progressing   Problem: Coping: Goal: Level of anxiety will decrease Outcome: Not Progressing   Problem: Elimination: Goal: Will not experience complications related to bowel motility Outcome: Not Progressing Goal: Will not experience complications related to urinary retention Outcome: Not Progressing   Problem: Pain Managment: Goal: General experience of comfort will improve Outcome: Not Progressing   Problem: Safety: Goal: Ability to remain free from injury will improve Outcome: Not Progressing   Problem: Skin Integrity: Goal: Risk for impaired skin integrity will decrease Outcome: Not Progressing   Problem: Education: Goal: Ability to describe self-care measures that may prevent or decrease complications (Diabetes Survival Skills Education) will improve Outcome: Not Progressing Goal: Individualized Educational Video(s) Outcome: Not Progressing   Problem: Cardiac: Goal: Ability  to maintain an adequate cardiac output will improve Outcome: Not Progressing   Problem: Health Behavior/Discharge Planning: Goal: Ability to identify and utilize available resources and services will improve Outcome: Not Progressing Goal: Ability to manage health-related needs will improve Outcome: Not Progressing   Problem: Fluid Volume: Goal: Ability to achieve a balanced intake and output will improve Outcome: Not Progressing   Problem: Metabolic: Goal: Ability to maintain appropriate glucose levels will improve Outcome: Not Progressing   Problem: Nutritional: Goal: Maintenance of adequate nutrition will improve Outcome: Not Progressing Goal: Maintenance of adequate weight for body size and type will improve Outcome: Not Progressing   Problem: Respiratory: Goal: Will regain and/or maintain adequate ventilation Outcome: Not Progressing   Problem: Urinary Elimination: Goal: Ability to achieve and maintain adequate renal perfusion and functioning will improve Outcome: Not Progressing

## 2019-02-20 LAB — GLUCOSE, CAPILLARY
Glucose-Capillary: 109 mg/dL — ABNORMAL HIGH (ref 70–99)
Glucose-Capillary: 112 mg/dL — ABNORMAL HIGH (ref 70–99)
Glucose-Capillary: 117 mg/dL — ABNORMAL HIGH (ref 70–99)
Glucose-Capillary: 142 mg/dL — ABNORMAL HIGH (ref 70–99)
Glucose-Capillary: 155 mg/dL — ABNORMAL HIGH (ref 70–99)
Glucose-Capillary: 65 mg/dL — ABNORMAL LOW (ref 70–99)
Glucose-Capillary: 95 mg/dL (ref 70–99)

## 2019-02-20 LAB — HEPATITIS B CORE ANTIBODY, IGM: Hep B C IgM: NEGATIVE

## 2019-02-20 LAB — ACETAMINOPHEN LEVEL
Acetaminophen (Tylenol), Serum: 24 ug/mL (ref 10–30)
Acetaminophen (Tylenol), Serum: 11 ug/mL (ref 10–30)

## 2019-02-20 LAB — COMPREHENSIVE METABOLIC PANEL
ALT: 5751 U/L — ABNORMAL HIGH (ref 0–44)
ALT: 3045 U/L — ABNORMAL HIGH (ref 0–44)
AST: >10000 U/L — ABNORMAL HIGH (ref 15–41)
AST: 4848 U/L — ABNORMAL HIGH (ref 15–41)
Albumin: 2.4 g/dL — ABNORMAL LOW (ref 3.5–5.0)
Albumin: 2.6 g/dL — ABNORMAL LOW (ref 3.5–5.0)
Alkaline Phosphatase: 170 U/L — ABNORMAL HIGH (ref 38–126)
Alkaline Phosphatase: 166 U/L — ABNORMAL HIGH (ref 38–126)
Anion gap: 12 (ref 5–15)
Anion gap: 13 (ref 5–15)
BUN: 32 mg/dL — ABNORMAL HIGH (ref 6–20)
BUN: 34 mg/dL — ABNORMAL HIGH (ref 6–20)
CO2: 24 mmol/L (ref 22–32)
CO2: 22 mmol/L (ref 22–32)
Calcium: 7.7 mg/dL — ABNORMAL LOW (ref 8.9–10.3)
Calcium: 7.9 mg/dL — ABNORMAL LOW (ref 8.9–10.3)
Chloride: 112 mmol/L — ABNORMAL HIGH (ref 98–111)
Chloride: 112 mmol/L — ABNORMAL HIGH (ref 98–111)
Creatinine, Ser: 2.04 mg/dL — ABNORMAL HIGH (ref 0.61–1.24)
Creatinine, Ser: 2.42 mg/dL — ABNORMAL HIGH (ref 0.61–1.24)
GFR calc Af Amer: 42 mL/min — ABNORMAL LOW (ref >60)
GFR calc Af Amer: 34 mL/min — ABNORMAL LOW (ref >60)
GFR calc non Af Amer: 36 mL/min — ABNORMAL LOW (ref >60)
GFR calc non Af Amer: 29 mL/min — ABNORMAL LOW (ref >60)
Glucose, Bld: 190 mg/dL — ABNORMAL HIGH (ref 70–99)
Glucose, Bld: 134 mg/dL — ABNORMAL HIGH (ref 70–99)
Potassium: 2.6 mmol/L — CL (ref 3.5–5.1)
Potassium: 3.3 mmol/L — ABNORMAL LOW (ref 3.5–5.1)
Sodium: 148 mmol/L — ABNORMAL HIGH (ref 135–145)
Sodium: 147 mmol/L — ABNORMAL HIGH (ref 135–145)
Total Bilirubin: 2.6 mg/dL — ABNORMAL HIGH (ref 0.3–1.2)
Total Bilirubin: 3.4 mg/dL — ABNORMAL HIGH (ref 0.3–1.2)
Total Protein: 4.4 g/dL — ABNORMAL LOW (ref 6.5–8.1)
Total Protein: 4.7 g/dL — ABNORMAL LOW (ref 6.5–8.1)

## 2019-02-20 LAB — CBC
HCT: 35.1 % — ABNORMAL LOW (ref 39.0–52.0)
Hemoglobin: 11.7 g/dL — ABNORMAL LOW (ref 13.0–17.0)
MCH: 29.5 pg (ref 26.0–34.0)
MCHC: 33.3 g/dL (ref 30.0–36.0)
MCV: 88.6 fL (ref 80.0–100.0)
Platelets: 110 10*3/uL — ABNORMAL LOW (ref 150–400)
RBC: 3.96 MIL/uL — ABNORMAL LOW (ref 4.22–5.81)
RDW: 13.3 % (ref 11.5–15.5)
WBC: 5.8 10*3/uL (ref 4.0–10.5)
nRBC: 0 % (ref 0.0–0.2)

## 2019-02-20 LAB — HEPATITIS C ANTIBODY: HCV Ab: <0.1 s/co ratio (ref 0.0–0.9)

## 2019-02-20 LAB — MAGNESIUM: Magnesium: 2.4 mg/dL (ref 1.7–2.4)

## 2019-02-20 LAB — PROTIME-INR
INR: 3.4 — ABNORMAL HIGH (ref 0.8–1.2)
INR: 2.2 — ABNORMAL HIGH (ref 0.8–1.2)
Prothrombin Time: 34 seconds — ABNORMAL HIGH (ref 11.4–15.2)
Prothrombin Time: 24.4 seconds — ABNORMAL HIGH (ref 11.4–15.2)

## 2019-02-20 LAB — AMMONIA: Ammonia: 45 umol/L — ABNORMAL HIGH (ref 9–35)

## 2019-02-20 LAB — HEPATITIS B SURFACE ANTIBODY, QUANTITATIVE: Hep B S AB Quant (Post): 21.4 m[IU]/mL (ref >9.9)

## 2019-02-20 LAB — HEPATITIS B SURFACE ANTIGEN: Hepatitis B Surface Ag: NEGATIVE

## 2019-02-20 LAB — HEPATITIS A ANTIBODY, IGM: Hep A IgM: NEGATIVE

## 2019-02-20 MED ORDER — DEXTROSE 50 % IV SOLN
12.5000 g | INTRAVENOUS | Status: AC
  Administered 2019-02-20: 12.5 g via INTRAVENOUS
  Filled 2019-02-20: qty 50

## 2019-02-20 MED ORDER — VITAMIN K1 10 MG/ML IJ SOLN
10.0000 mg | Freq: Once | INTRAVENOUS | Status: AC
  Administered 2019-02-20: 10 mg via INTRAVENOUS
  Filled 2019-02-20: qty 1

## 2019-02-20 MED ORDER — SODIUM CHLORIDE 0.9% IV SOLUTION
Freq: Once | INTRAVENOUS | Status: AC
  Administered 2019-02-20: 14:00:00 via INTRAVENOUS

## 2019-02-20 MED ORDER — INSULIN GLARGINE 100 UNIT/ML ~~LOC~~ SOLN
10.0000 [IU] | Freq: Every day | SUBCUTANEOUS | Status: DC
  Administered 2019-02-20 – 2019-02-21 (×2): 10 [IU] via SUBCUTANEOUS
  Filled 2019-02-20 (×2): qty 0.1

## 2019-02-20 MED ORDER — POTASSIUM CHLORIDE 10 MEQ/100ML IV SOLN
10.0000 meq | INTRAVENOUS | Status: AC
  Administered 2019-02-20 (×6): 10 meq via INTRAVENOUS
  Filled 2019-02-20 (×6): qty 100

## 2019-02-20 MED ORDER — SODIUM CHLORIDE 0.9 % IV SOLN
15.0000 mg/kg | Freq: Once | INTRAVENOUS | Status: AC
  Administered 2019-02-20: 690 mg via INTRAVENOUS
  Filled 2019-02-20: qty 0.69

## 2019-02-20 NOTE — Progress Notes (Signed)
Patient face sheet was sent [facility]

## 2019-02-20 NOTE — Progress Notes (Signed)
Bilateral wrist restraints Discontinued @ 0000 hrs. Pt  Drowsy and no longer pulling lines/ interfering with care. Pt Tachypneic otherwise VSS. To continue to monitor and treat pt per MD and Nurse orders

## 2019-02-20 NOTE — Progress Notes (Signed)
Initial Nutrition Assessment  DOCUMENTATION CODES:   Underweight  INTERVENTION:  Recommend when appropriate:    -Boost Breeze po TID, each supplement provides 250 kcal and 9 grams of protein  -Restart daily MVI  -Advance diet as tolerated  -Patient at risk for refeeding Monitor magnesium, potassium, and phosphorus daily for at least 3 days as diet is advanced; MD to replete as needed   NUTRITION DIAGNOSIS:   Inadequate oral intake related to altered GI function(chronic pancreatitis) as evidenced by NPO status.   GOAL:   Patient will meet greater than or equal to 90% of their needs   MONITOR:   Diet advancement, Weight trends, Labs, I & O's  REASON FOR ASSESSMENT:   Malnutrition Screening Tool    ASSESSMENT:  RD working remotely.  53 year old male with medical history significant for IDDM, HTN,  EtoH abuse, chronic pancreatitis w/ hx of chronic biliary stenosis s/p ERCP w/ stent placement presented to ED with persistent n/v over the past 2 days  Unable to speak with patient today. Patient noted with metabolic encephalopathy and acute liver injury second to acetaminophen toxicity. MD note reports patient resting comfortably this afternoon.   Per chart review, pt CIWA score 13 with AMS yesterday; repeatedly removing gown, pulling off leads, not following commands.  RN noted, patient had an episode of vomiting;extra large volume of brown liquid with large amount of bright red liquid.   Patient with acetaminophen level elevated to 135 with unknown time/amt of ingestion - pt denies use  Weights reviewed - current wt 43.6kg Pt with 27.8 lb (21%) wt loss x 3 months - severe for time frame.  Suspect patient to have mod/sev malnutrition, considering noted wt losses and history of chronic pancreatitis  Medications reviewed and include: Folic acid, SSI, Lantus 10 units daily, MVI, thiamine D5 - .45%NaCL @ 164ml/hr  Labs:Na 148 (H) K 2.6 (L) AST >10,000 ALT  5,751   Intake/Output Summary (Last 24 hours) at 02/20/2019 1529 Last data filed at 02/20/2019 1519 Gross per 24 hour  Intake 2603.28 ml  Output 1825 ml  Net 778.28 ml      NUTRITION - FOCUSED PHYSICAL EXAM: Unable to complete at this time   Diet Order:   Diet Order            Diet NPO time specified Except for: Ice Chips  Diet effective now              EDUCATION NEEDS:   Not appropriate for education at this time  Skin:  Skin Assessment: Reviewed RN Assessment  Last BM:  7/15 (type 6)  Height:   Ht Readings from Last 1 Encounters:  02/19/19 5\' 8"  (1.727 m)    Weight:   Wt Readings from Last 1 Encounters:  02/19/19 46.3 kg    Ideal Body Weight:  70 kg  BMI:  Body mass index is 15.51 kg/m.  Estimated Nutritional Needs:   Kcal:  1500-1700  Protein:  64-74  Fluid:  >1.5L    Lajuan Lines, RD, LDN  After Hours/Weekend Pager: [phone]

## 2019-02-20 NOTE — Progress Notes (Addendum)
PROGRESS NOTE    Rodney Barber  OZH:[id number] DOB: 12/24/1965 DOA: 02/18/2019 PCP: [name], NP    Brief Narrative:   Rodney Barber is a 53 y.o. male with medical history significant for uncontrolled insulin-dependent type 2 diabetes, hypertension, chronic pancreatitis with history of chronic biliary stenosis, tobacco use, alcohol use, and substance use disorder who presents to the ED for evaluation of persistent nausea and vomiting.  Patient states he had sudden onset of persistent nausea and vomiting beginning 2 days ago.  He says anytime he tries to drink fluids he would have immediate emesis.  He has not been able to keep anything down by mouth.  He is associated abdominal pain which occurs only with his episodes of emesis.  He reports having some tremors and diaphoresis.  He denies any chest pain, palpitations, or dyspnea.  He reports good urine output.  He denies any diarrhea.  He does admit that he has been missing his home insulin doses recently.  He denies any Tylenol use.  He reports smoking 2 packs/week.  He denies any alcohol use.  He reports occasional cocaine use with last use about 3 days ago.  ED Course:  Initial vitals showed BP 166/69, pulse 84, RR 20, temp 98.9 Fahrenheit, SPO2 98% on room air.  Labs are notable for serum glucose 517, sodium 143 (153 when corrected for hyperglycemia), potassium 3.4, bicarb 19, BUN 30, creatinine 2.44, albumin 3.3.WBC 11.5, hemoglobin 12.7, platelets 233,000, serum ethanol level undetectable. VBG showed pH 7.493, PCO2 30.7, PO2 71.  AST 5859, ALT 2091, alk phos 165, total bilirubin 2.3.Lipase 19.  Patient was given 1 L normal saline and 10 units NovoLog IV.  Abdominal ultrasound was ordered and pending.  The hospitalist service was consulted to admit for further evaluation and management.   Assessment & Plan:   Principal Problem:   Hyperosmolar non-ketotic state in patient with type 2 diabetes mellitus (HCC) Active  Problems:   Chronic pancreatitis (HCC)   Hypokalemia   AKI (acute kidney injury) (HCC)   Shock liver   Alcohol use   Substance use disorder  Addendum 1230: Discussed case with toxicology, Dr. Caryn Section; recommended repeat fomepizole, continued NAC, and give 1 unit FFP, and consider transfer to a tertiary center with liver transplant capability.  Addendum 1530: Case discussed with hepatologist, Dr. Huston Foley and hospitalist Dr. Zollie Beckers at [facility] regarding consideration transfer to tertiary center with liver transplant capability per recommendations of Dr. Caryn Section with toxicology.  Per Dr. Huston Foley, he is not an optimal transplant candidate given his substance abuse with alcohol and cocaine.  Dr. Zollie Beckers with hospital service has accepted patient in transfer pending bed availability.  Acute metabolic encephalopathy Etiology likely multifactorial with acute liver failure, DKA, acute renal failure, and substance abuse with cocaine and EtOH with likely withdrawal symptoms.  Also with elevated ammonia level, suspect some mild hepatic encephalopathy. --Titrated off of insulin drip on 02/19/2019 --Continue treatment for acute liver failure/acetaminophen toxicity as below --Continue lactulose enemas BID; goal 2-3 soft BMs daily --CIWAA for EtOH withdrawal  Acute liver injury/acetaminophen toxicity: Significant LFT elevations with AST 5859, ALT 2091, alk phos 165, T bili 2.3.  Suspect shock liver from significant dehydration/hypovolemia versus acetaminophen toxicity. Patient does have history of chronic pancreatitis and chronic biliary stricture with prior stenting. Underwent ERCP 06/27/2011 by Dr. Ewing Schlein for stent removal.  Acetaminophen level elevated to 135. Unknown time or amount of ingestion as patient denies acetaminophen use.  Abdominal ultrasound with cholelithiasis and  borderline gallbladder wall thickening, mildly dilated CBD, trace pericholecystic fluid and negative Murphy sign. Lipase 19.   HIV nonreactive. --tylenol 135-->76-->43-->24 --AST 5,859, 9,548, >10,000, >10,000 --ALT 2, 091, 3,358, 3,505, 3,650, 4,408, 4,565, 5,751 --Tbili 2.3, 2.5, 2.5, 2.0, 2.2, 2.4, 2.6 --INR 3.0, 4.4, 3.4 --ammonia level 59-->45 --s/p fomepazole --s/p N-acetylcysteine bolus, continues N-acetylcysteine infusion at 15mg /kg/hr --Updated and discussed with poison control again this morning --Monitor CMET, INR, Tylenol level q12h; repeat lactic acid in the am  DKA in uncontrolled insulin-dependent type 2 diabetes: High anion gap metabolic acidosis Likely secondary to nonadherence to home medications; which include glipizide 5 mg p.o. twice daily and Levemir 25 units [place] qHS. Hemoglobin A1c 17.7, urinalysis with 5 ketones, anion gap 26 on admission. --Transition from insulin drip to Lantus 7/15 --Continue Lantus 10 units subcutaneously daily --NovoLog insulin sliding scale for coverage if needed  Coagulopathy Etiology likely secondary to acute hepatic failure from acetaminophen toxicity as above.  Patient with noted dried blood in oral mucosa as well as dark red blood in Foley catheter.  Hemoglobin stable. --INR 3.0, 4.4, 3.4 --Updated poison control today, recommend repeat vitamin K IV 10 mg --Daily INR  Acute kidney injury: Acute obstructive uropathy: Creatinine on admission 2.44.  Likely secondary to dehydration.  --Cr 2.44-->1.74-->1.45-->2.04  --UOP past 24h --Continue IV fluids  --Continue Foley catheter --Strict I's and O's --Continue to monitor renal function closely  Hypokalemia: K 2.9 this am. IV repletion ordered.  Continue to monitor.  Hypertension: Holding home lisinopril with AKI.  BP improved, 136/74 this am. --Clonidine 0.1 mg transdermal patch --IV hydralazine as needed. --Continue to monitor BP closely.  Chronic pancreatitis: Lipase 19, within normal limits on admission.  Uses Creon at home.  Holding for now while n.p.o.  Alcohol use  disorder: Reported history of alcohol use disorder resulting in chronic pancreatitis.  Patient denies any recent use. Serum ethanol level was undetectable.   --monitor on CIWA protocol with as needed Ativan.  Substance use disorder: Patient admits to cocaine use, UDS is positive for cocaine.  Tobacco use: Nicotine patch ordered.  Severe protein calorie malnutrition BMI 15.51, physical exam with evidence of significant muscle wasting. --Currently n.p.o. with encephalopathy as above --Once mentation improves, will consult nutrition and speech therapy for evaluation and further needs   DVT prophylaxis: SCDs, chemical DVT prophylaxis contraindicated with coagulopathy/acute liver failure  Code Status: DNR Family Communication: Updated patient's brother, Trey Paula by telephone this morning Disposition Plan: Continue inpatient hospitalization on progressive unit, further to be determined; grim prognosis   Consultants:   [employer] control  Procedures:   None  Antimicrobials:   None   Subjective:  Patient seen and examined at bedside, resting comfortably.  Removed from restraints overnight.  Continues to be encephalopathic.  Unable to obtain any further ROS secondary to patient's current encephalopathy.  Updated poison control this morning regarding interval events, recommend continue NAC for now.  Updated patient's brother, Trey Paula via telephone this morning and expressed grim prognosis given his hepatic failure.  He confirms DNR status. No other concerns overnight per nursing staff.  Objective: Vitals:   02/20/19 0617 02/20/19 0815 02/20/19 0817 02/20/19 0911  BP: 136/74 (!) 154/80    Pulse:  (!) 102  (!) 101  Resp:  (!) 22  (!) 23  Temp:   97.7 F (36.5 C)   TempSrc:   Axillary   SpO2:  99%  99%  Weight:      Height:  Intake/Output Summary (Last 24 hours) at 02/20/2019 0950 Last data filed at 02/20/2019 0630 Gross per 24 hour  Intake 3165.79 ml  Output 1375 ml   Net 1790.79 ml   Filed Weights   02/18/19 1945 02/19/19 0230  Weight: 61.2 kg 46.3 kg    Examination:  General exam: Somnolent, confused; cachectic in appearance HEENT: Dried blood noted oral mucosa Respiratory system: Coarse breath sounds bilaterally, respiratory effort normal, on 4 L nasal cannula Cardiovascular system: S1 & S2 heard, 3/6 systolic ejection murmur left upper sternal border. No JVD, rubs, gallops or clicks. No pedal edema. Gastrointestinal system: Abdomen is nondistended and scaphoid in appearance, soft and nontender. No organomegaly or masses felt. Normal bowel sounds heard. Central nervous system: Somnolent, opens eyes to commands, no focal neurological deficit Extremities: Symmetric 5 x 5 power. Skin: No rashes, lesions or ulcers Psychiatry: Somnolent, confused.     Data Reviewed: I have personally reviewed following labs and imaging studies  CBC: Recent Labs  Lab 02/18/19 1945 02/18/19 1959 02/19/19 0630 02/20/19 0504  WBC 11.5*  --  10.2 5.8  NEUTROABS 10.6*  --   --   --   HGB 12.7* 12.9* 13.6 11.7*  HCT 37.7* 38.0* 41.2 35.1*  MCV 89.5  --  89.6 88.6  PLT 233  --  183 110*   Basic Metabolic Panel: Recent Labs  Lab 02/18/19 2250 02/19/19 0305  02/19/19 0630 02/19/19 1112 02/19/19 1520 02/19/19 1719 02/20/19 0504 02/20/19 0808  NA  --   --    < > 150* 150* 150* 150* 148*  --   K  --   --    < > 3.3* 3.2* 3.3* 3.3* 2.6*  --   CL  --   --    < > 104 108 110 112* 112*  --   CO2  --   --    < > 28 27 27 28 24   --   GLUCOSE  --   --    < > 306* 222* 105* 91 190*  --   BUN  --   --    < > 28* 28* 29* 29* 32*  --   CREATININE  --   --    < > 1.60* 1.45* 1.48* 1.52* 2.04*  --   CALCIUM  --   --    < > 8.2* 7.8* 8.0* 8.0* 7.7*  --   MG 2.6* 2.8*  --   --   --   --   --   --  2.4   < > = values in this interval not displayed.   GFR: Estimated Creatinine Clearance: 27.1 mL/min (A) (by C-G formula based on SCr of 2.04 mg/dL (H)). Liver Function  Tests: Recent Labs  Lab 02/19/19 0630 02/19/19 1112 02/19/19 1520 02/19/19 1719 02/20/19 0504  AST >10,000* >10,000* >10,000* >10,000* >10,000*  ALT 3,505* 3,650* 4,408* 4,565* 5,751*  ALKPHOS 155* 133* 150* 159* 170*  BILITOT 2.5* 2.0* 2.2* 2.4* 2.6*  PROT 5.9* 5.0* 5.3* 5.6* 4.4*  ALBUMIN 3.2* 2.6* 2.7* 2.8* 2.4*   Recent Labs  Lab 02/18/19 1945  LIPASE 19   Recent Labs  Lab 02/19/19 0646 02/20/19 0504  AMMONIA 59* 45*   Coagulation Profile: Recent Labs  Lab 02/19/19 0401 02/19/19 1719 02/20/19 0504  INR 3.0* 4.4* 3.4*   Cardiac Enzymes: No results for input(s): CKTOTAL, CKMB, CKMBINDEX, TROPONINI in the last 168 hours. BNP (last 3 results) No results for input(s): PROBNP in the last 8760  hours. HbA1C: Recent Labs    02/18/19 2307  HGBA1C 17.7*   CBG: Recent Labs  Lab 02/19/19 2356 02/20/19 0039 02/20/19 0421 02/20/19 0600 02/20/19 0805  GLUCAP 103* 95 65* 155* 142*   Lipid Profile: No results for input(s): CHOL, HDL, LDLCALC, TRIG, CHOLHDL, LDLDIRECT in the last 72 hours. Thyroid Function Tests: No results for input(s): TSH, T4TOTAL, FREET4, T3FREE, THYROIDAB in the last 72 hours. Anemia Panel: No results for input(s): VITAMINB12, FOLATE, FERRITIN, TIBC, IRON, RETICCTPCT in the last 72 hours. Sepsis Labs: No results for input(s): PROCALCITON, LATICACIDVEN in the last 168 hours.  Recent Results (from the past 240 hour(s))  SARS Coronavirus 2 (CEPHEID - Performed in [facility] hospital lab), Hosp Order     Status: None   Collection Time: 02/18/19 10:38 PM   Specimen: Nasopharyngeal Swab  Result Value Ref Range Status   SARS Coronavirus 2 NEGATIVE NEGATIVE Final    Comment: (NOTE) If result is NEGATIVE SARS-CoV-2 target nucleic acids are NOT DETECTED. The SARS-CoV-2 RNA is generally detectable in upper and lower  respiratory specimens during the acute phase of infection. The lowest  concentration of SARS-CoV-2 viral copies this assay can  detect is 250  copies / mL. A negative result does not preclude SARS-CoV-2 infection  and should not be used as the sole basis for treatment or other  patient management decisions.  A negative result may occur with  improper specimen collection / handling, submission of specimen other  than nasopharyngeal swab, presence of viral mutation(s) within the  areas targeted by this assay, and inadequate number of viral copies  (<250 copies / mL). A negative result must be combined with clinical  observations, patient history, and epidemiological information. If result is POSITIVE SARS-CoV-2 target nucleic acids are DETECTED. The SARS-CoV-2 RNA is generally detectable in upper and lower  respiratory specimens dur ing the acute phase of infection.  Positive  results are indicative of active infection with SARS-CoV-2.  Clinical  correlation with patient history and other diagnostic information is  necessary to determine patient infection status.  Positive results do  not rule out bacterial infection or co-infection with other viruses. If result is PRESUMPTIVE POSTIVE SARS-CoV-2 nucleic acids MAY BE PRESENT.   A presumptive positive result was obtained on the submitted specimen  and confirmed on repeat testing.  While 2019 novel coronavirus  (SARS-CoV-2) nucleic acids may be present in the submitted sample  additional confirmatory testing may be necessary for epidemiological  and / or clinical management purposes  to differentiate between  SARS-CoV-2 and other Sarbecovirus currently known to infect humans.  If clinically indicated additional testing with an alternate test  methodology [phone]) is advised. The SARS-CoV-2 RNA is generally  detectable in upper and lower respiratory sp ecimens during the acute  phase of infection. The expected result is Negative. Fact Sheet for Patients:  BoilerBrush.com.cy Fact Sheet for Healthcare  Providers: https://pope.com/ This test is not yet approved or cleared by the [place] FDA and has been authorized for detection and/or diagnosis of SARS-CoV-2 by FDA under an Emergency Use Authorization (EUA).  This EUA will remain in effect (meaning this test can be used) for the duration of the COVID-19 declaration under Section 564(b)(1) of the Act, 21 U.S.C. section 360bbb-3(b)(1), unless the authorization is terminated or revoked sooner. Performed at [facility], [address] [place], [place] [address]   MRSA PCR Screening     Status: None   Collection Time: 02/19/19  2:50 AM  Specimen: Nasal Mucosa; Nasopharyngeal  Result Value Ref Range Status   MRSA by PCR NEGATIVE NEGATIVE Final    Comment:        The GeneXpert MRSA Assay (FDA approved for NASAL specimens only), is one component of a comprehensive MRSA colonization surveillance program. It is not intended to diagnose MRSA infection nor to guide or monitor treatment for MRSA infections. Performed at [facility], [address] [place], [place] [address]          Radiology Studies: US Abdomen Complete  Result Date: 02/19/2019 CLINICAL DATA:  53 year old male with abnormal LFTs. EXAM: ABDOMEN ULTRASOUND COMPLETE COMPARISON:  Abdomen ultrasound 12/02/2018. CT Abdomen and Pelvis 07/10/2013. FINDINGS: Gallbladder: A shadowing echogenic stone is identified on image 5, about 6 millimeters. Gallbladder wall thickness is at the upper limits of normal to mildly increased. There is trace pericholecystic fluid on image 14. No sonographic Murphy sign elicited. Decreased sludge compared to the prior ultrasound. Common bile duct: Diameter: 7 millimeters, increased since April and mildly abnormal. No filling defect identified within the visible duct. Liver: No intrahepatic biliary ductal dilatation. Liver echogenicity within normal limits. No discrete liver lesion identified. Portal vein  is patent on color Doppler imaging with normal direction of blood flow towards the liver. IVC: No abnormality visualized. Pancreas: Not well visualized. Spleen: Not well visualized. Right Kidney: Length: 10.1 centimeters. No hydronephrosis. Renal echogenicity within normal limits. Shadowing lower pole calculus estimated at 7-8 millimeters (image 82). Left Kidney: Length: 10.4 centimeters. Left renal echogenicity within normal limits. 1 large versus multiple shadowing calculi near the renal hilum (image 95). No left hydronephrosis. A lower pole calculus estimated at 9 millimeters is identified on image 103. Abdominal aorta: Negative for aneurysm. Positive for irregularity compatible with atherosclerosis. Other findings: Fluid distended stomach.  No free fluid. IMPRESSION: 1. Positive for cholelithiasis, borderline to mild gallbladder wall thickening, borderline to mildly dilated CBD, and trace pericholecystic fluid. No sonographic [name] sign was elicited, and there is no intrahepatic biliary dilatation to strongly suggest acute duct obstruction. However, consider the possibility of choledocholithiasis. MRCP versus Nuclear Medicine Hepatobiliary Scan could be valuable in this clinical setting. 2. Nephrolithiasis. No other acute ultrasound finding in the abdomen. Electronically Signed   By: Odessa Fleming M.D.   On: 02/19/2019 01:03   Dg Chest Port 1 View  Result Date: 02/19/2019 CLINICAL DATA:  Cough EXAM: PORTABLE CHEST 1 VIEW COMPARISON:  12/02/2018 FINDINGS: Heart and mediastinal contours are within normal limits. No focal opacities or effusions. No acute bony abnormality. IMPRESSION: No active disease. Electronically Signed   By: Charlett Nose M.D.   On: 02/19/2019 17:42        Scheduled Meds:  chlorhexidine  15 mL Mouth Rinse BID   cloNIDine  0.1 mg Transdermal Weekly   folic acid  1 mg Oral Daily   insulin aspart  0-9 Units Subcutaneous TID WC   insulin glargine  10 Units Subcutaneous Daily    lactulose  300 mL Rectal BID   mouth rinse  15 mL Mouth Rinse q12n4p   multivitamin with minerals  1 tablet Oral Daily   nicotine  14 mg Transdermal Daily   thiamine  100 mg Oral Daily   Continuous Infusions:  sodium chloride Stopped (02/19/19 1900)   acetylcysteine 15 mg/kg/hr (02/20/19 0630)   dextrose 5 % and 0.45% NaCl 100 mL/hr at 02/20/19 0630   fomepizole (ANTIZOL) IV     insulin Stopped (02/19/19 1908)   phytonadione (VITAMIN K)  IV     potassium chloride 10 mEq (02/20/19 0900)     LOS: 2 days    Critical Care Time Upon my evaluation, this patient had a high probability of imminent or life-threatening deterioration due to acute hepatic failure secondary to Tylenol toxicity, acute renal failure, acute metabolic encephalopathy, diabetic ketoacidosis, coagulopathy which required my direct attention, intervention, and personal management.  I have personally provided 48 minutes of critical care time exclusive of my time spent on separately billable procedures.  Time includes review of laboratory data, radiology results, discussion with consultants, and monitoring for potential decompensation.       Alvira Philips [place], DO [facility] Pager [phone]  If 7PM-7AM, please contact night-coverage [email or web address] Password [email or web address] 02/20/2019, 9:50 AM

## 2019-02-21 ENCOUNTER — Inpatient Hospital Stay (HOSPITAL_COMMUNITY): Payer: Self-pay

## 2019-02-21 DIAGNOSIS — Z515 Encounter for palliative care: Secondary | ICD-10-CM

## 2019-02-21 DIAGNOSIS — Z7189 Other specified counseling: Secondary | ICD-10-CM

## 2019-02-21 LAB — PREPARE FRESH FROZEN PLASMA: Unit division: 0

## 2019-02-21 LAB — GLUCOSE, CAPILLARY
Glucose-Capillary: 117 mg/dL — ABNORMAL HIGH (ref 70–99)
Glucose-Capillary: 119 mg/dL — ABNORMAL HIGH (ref 70–99)
Glucose-Capillary: 131 mg/dL — ABNORMAL HIGH (ref 70–99)
Glucose-Capillary: 137 mg/dL — ABNORMAL HIGH (ref 70–99)
Glucose-Capillary: 142 mg/dL — ABNORMAL HIGH (ref 70–99)
Glucose-Capillary: 59 mg/dL — ABNORMAL LOW (ref 70–99)
Glucose-Capillary: 79 mg/dL (ref 70–99)
Glucose-Capillary: 97 mg/dL (ref 70–99)

## 2019-02-21 LAB — COMPREHENSIVE METABOLIC PANEL
ALT: 2254 U/L — ABNORMAL HIGH (ref 0–44)
ALT: 1706 U/L — ABNORMAL HIGH (ref 0–44)
AST: 1879 U/L — ABNORMAL HIGH (ref 15–41)
AST: 855 U/L — ABNORMAL HIGH (ref 15–41)
Albumin: 2.3 g/dL — ABNORMAL LOW (ref 3.5–5.0)
Albumin: 2.4 g/dL — ABNORMAL LOW (ref 3.5–5.0)
Alkaline Phosphatase: 193 U/L — ABNORMAL HIGH (ref 38–126)
Alkaline Phosphatase: 216 U/L — ABNORMAL HIGH (ref 38–126)
Anion gap: 10 (ref 5–15)
Anion gap: 10 (ref 5–15)
BUN: 34 mg/dL — ABNORMAL HIGH (ref 6–20)
BUN: 34 mg/dL — ABNORMAL HIGH (ref 6–20)
CO2: 23 mmol/L (ref 22–32)
CO2: 23 mmol/L (ref 22–32)
Calcium: 7.8 mg/dL — ABNORMAL LOW (ref 8.9–10.3)
Calcium: 7.7 mg/dL — ABNORMAL LOW (ref 8.9–10.3)
Chloride: 113 mmol/L — ABNORMAL HIGH (ref 98–111)
Chloride: 115 mmol/L — ABNORMAL HIGH (ref 98–111)
Creatinine, Ser: 2.38 mg/dL — ABNORMAL HIGH (ref 0.61–1.24)
Creatinine, Ser: 2.35 mg/dL — ABNORMAL HIGH (ref 0.61–1.24)
GFR calc Af Amer: 35 mL/min — ABNORMAL LOW (ref >60)
GFR calc Af Amer: 35 mL/min — ABNORMAL LOW (ref >60)
GFR calc non Af Amer: 30 mL/min — ABNORMAL LOW (ref >60)
GFR calc non Af Amer: 30 mL/min — ABNORMAL LOW (ref >60)
Glucose, Bld: 143 mg/dL — ABNORMAL HIGH (ref 70–99)
Glucose, Bld: 66 mg/dL — ABNORMAL LOW (ref 70–99)
Potassium: 2.6 mmol/L — CL (ref 3.5–5.1)
Potassium: 3.5 mmol/L (ref 3.5–5.1)
Sodium: 146 mmol/L — ABNORMAL HIGH (ref 135–145)
Sodium: 148 mmol/L — ABNORMAL HIGH (ref 135–145)
Total Bilirubin: 4.5 mg/dL — ABNORMAL HIGH (ref 0.3–1.2)
Total Bilirubin: 5 mg/dL — ABNORMAL HIGH (ref 0.3–1.2)
Total Protein: 4.6 g/dL — ABNORMAL LOW (ref 6.5–8.1)
Total Protein: 4.5 g/dL — ABNORMAL LOW (ref 6.5–8.1)

## 2019-02-21 LAB — CBC
HCT: 31.8 % — ABNORMAL LOW (ref 39.0–52.0)
Hemoglobin: 10.9 g/dL — ABNORMAL LOW (ref 13.0–17.0)
MCH: 29.9 pg (ref 26.0–34.0)
MCHC: 34.3 g/dL (ref 30.0–36.0)
MCV: 87.1 fL (ref 80.0–100.0)
Platelets: 98 10*3/uL — ABNORMAL LOW (ref 150–400)
RBC: 3.65 MIL/uL — ABNORMAL LOW (ref 4.22–5.81)
RDW: 13.2 % (ref 11.5–15.5)
WBC: 4.6 10*3/uL (ref 4.0–10.5)
nRBC: 0.9 % — ABNORMAL HIGH (ref 0.0–0.2)

## 2019-02-21 LAB — PROTIME-INR
INR: 1.9 — ABNORMAL HIGH (ref 0.8–1.2)
INR: 1.6 — ABNORMAL HIGH (ref 0.8–1.2)
Prothrombin Time: 21.4 seconds — ABNORMAL HIGH (ref 11.4–15.2)
Prothrombin Time: 18.9 seconds — ABNORMAL HIGH (ref 11.4–15.2)

## 2019-02-21 LAB — LACTIC ACID, PLASMA: Lactic Acid, Venous: 1.4 mmol/L (ref 0.5–1.9)

## 2019-02-21 LAB — BPAM FFP
Blood Product Expiration Date: 202007212359
ISSUE DATE / TIME: 202007161424
Unit Type and Rh: 6200

## 2019-02-21 LAB — ACETAMINOPHEN LEVEL: Acetaminophen (Tylenol), Serum: <10 ug/mL — ABNORMAL LOW (ref 10–30)

## 2019-02-21 LAB — MAGNESIUM: Magnesium: 2 mg/dL (ref 1.7–2.4)

## 2019-02-21 MED ORDER — DEXTROSE 50 % IV SOLN
INTRAVENOUS | Status: AC
  Filled 2019-02-21: qty 50

## 2019-02-21 MED ORDER — POTASSIUM CHLORIDE 10 MEQ/100ML IV SOLN
10.0000 meq | INTRAVENOUS | Status: AC
  Administered 2019-02-21 (×5): 10 meq via INTRAVENOUS
  Filled 2019-02-21 (×5): qty 100

## 2019-02-21 MED ORDER — ACETYLCYSTEINE LOAD VIA INFUSION
150.0000 mg/kg | Freq: Once | INTRAVENOUS | Status: AC
  Administered 2019-02-21: 6945 mg via INTRAVENOUS
  Filled 2019-02-21: qty 174

## 2019-02-21 MED ORDER — POTASSIUM CHLORIDE 10 MEQ/100ML IV SOLN
INTRAVENOUS | Status: AC
  Administered 2019-02-21: 10 meq
  Filled 2019-02-21: qty 100

## 2019-02-21 MED ORDER — DEXTROSE 5 % IV SOLN
15.0000 mg/kg/h | INTRAVENOUS | Status: DC
  Administered 2019-02-22: 15 mg/kg/h via INTRAVENOUS
  Filled 2019-02-21 (×3): qty 60

## 2019-02-21 MED ORDER — DEXTROSE 50 % IV SOLN
25.0000 mL | Freq: Once | INTRAVENOUS | Status: AC
  Administered 2019-02-21: 25 mL via INTRAVENOUS

## 2019-02-21 NOTE — Progress Notes (Signed)
PROGRESS NOTE    Rodney Barber  WJX:[id number] DOB: [birth year] DOA: 02/18/2019 PCP: [name], NP    Brief Narrative:   Rodney Barber is a 53 y.o. male with medical history significant for uncontrolled insulin-dependent type 2 diabetes, hypertension, chronic pancreatitis with history of chronic biliary stenosis, tobacco use, alcohol use, and substance use disorder who presents to the ED for evaluation of persistent nausea and vomiting.  Patient states he had sudden onset of persistent nausea and vomiting beginning 2 days ago.  He says anytime he tries to drink fluids he would have immediate emesis.  He has not been able to keep anything down by mouth.  He is associated abdominal pain which occurs only with his episodes of emesis.  He reports having some tremors and diaphoresis.  He denies any chest pain, palpitations, or dyspnea.  He reports good urine output.  He denies any diarrhea.  He does admit that he has been missing his home insulin doses recently.  He denies any Tylenol use.  He reports smoking 2 packs/week.  He denies any alcohol use.  He reports occasional cocaine use with last use about 3 days ago.  ED Course:  Initial vitals showed BP 166/69, pulse 84, RR 20, temp 98.9 Fahrenheit, SPO2 98% on room air.  Labs are notable for serum glucose 517, sodium 143 (153 when corrected for hyperglycemia), potassium 3.4, bicarb 19, BUN 30, creatinine 2.44, albumin 3.3.WBC 11.5, hemoglobin 12.7, platelets 233,000, serum ethanol level undetectable. VBG showed pH 7.493, PCO2 30.7, PO2 71.  AST 5859, ALT 2091, alk phos 165, total bilirubin 2.3.Lipase 19.  Patient was given 1 L normal saline and 10 units NovoLog IV.  Abdominal ultrasound was ordered and pending.  The hospitalist service was consulted to admit for further evaluation and management.   Assessment & Plan:   Principal Problem:   Hyperosmolar non-ketotic state in patient with type 2 diabetes mellitus (HCC) Active  Problems:   Chronic pancreatitis (HCC)   Hypokalemia   AKI (acute kidney injury) (HCC)   Shock liver   Alcohol use   Substance use disorder   Acute metabolic encephalopathy Etiology likely multifactorial with acute liver failure, DKA, acute renal failure, and substance abuse with cocaine and EtOH with likely withdrawal symptoms.  Also with elevated ammonia level, suspect some mild hepatic encephalopathy. --Titrated off of insulin drip on 02/19/2019 --Continue treatment for acute liver failure/acetaminophen toxicity as below --Continue lactulose enemas BID; goal 2-3 soft BMs daily --CIWAA for EtOH withdrawal --Check CT head without contrast today  Acute liver injury/acetaminophen toxicity: Significant LFT elevations with AST 5859, ALT 2091, alk phos 165, T bili 2.3.  Suspect shock liver from significant dehydration/hypovolemia versus acetaminophen toxicity. Patient does have history of chronic pancreatitis and chronic biliary stricture with prior stenting. Underwent ERCP 06/27/2011 by Dr. Ewing Schlein for stent removal.  Acetaminophen level elevated to 135. Unknown time or amount of ingestion as patient denies acetaminophen use.  Abdominal ultrasound with cholelithiasis and borderline gallbladder wall thickening, mildly dilated CBD, trace pericholecystic fluid and negative Murphy sign. Lipase 19.  HIV nonreactive. --tylenol 135-->76-->43-->24--> now <10 --AST 5,859, 9,548, >10,000, >10,000, 1,879 --ALT 2, 091, 3,358, 3,505, 3,650, 4,408, 4,565, 5,751, 2,254 --Tbili 2.3, 2.5, 2.5, 2.0, 2.2, 2.4, 2.6, 4.5 --INR 3.0, 4.4, 3.4, 1.9 --ammonia level 59-->45 --s/p fomepazole x 3 doses --s/p N-acetylcysteine bolus, continues N-acetylcysteine infusion at 15mg /kg/hr --Updated and discussed with poison control again this morning who recommends to continue NAC --Monitor CMET, INR, Tylenol level q12h; repeat lactic  acid in the am  DKA in uncontrolled insulin-dependent type 2 diabetes: High anion gap  metabolic acidosis Likely secondary to nonadherence to home medications; which include glipizide 5 mg p.o. twice daily and Levemir 25 units [place] qHS. Hemoglobin A1c 17.7, urinalysis with 5 ketones, anion gap 26 on admission. --Transition from insulin drip to Lantus 7/15 --Continue Lantus 10 units subcutaneously daily --NovoLog insulin sliding scale for coverage if needed  Coagulopathy Etiology likely secondary to acute hepatic failure from acetaminophen toxicity as above.  Patient with noted dried blood in oral mucosa as well as dark red blood in Foley catheter.  Hemoglobin stable. --INR 3.0, 4.4, 3.4, 1.9 --s/p IV vit K 10mg  x 2 and 1u FFP --Daily INR  Acute kidney injury: Acute obstructive uropathy: Creatinine on admission 2.44.  Likely secondary to dehydration.  --Cr 2.44-->1.74-->1.45-->2.04-->2.38 --UOP past 24h --Continue IV fluids  --Continue Foley catheter --Strict I's and O's --Continue to monitor renal function closely  Hypokalemia: K 2.6 this am. IV repletion ordered.  Continue to monitor.  Hypertension: Holding home lisinopril with AKI.  BP improved, 140/74 this am. --Clonidine 0.1 mg transdermal patch --IV hydralazine as needed. --Continue to monitor BP closely.  Chronic pancreatitis: Lipase 19, within normal limits on admission.  Uses Creon at home.  Holding for now while n.p.o.  Alcohol use disorder: Reported history of alcohol use disorder resulting in chronic pancreatitis.  Patient denies any recent use. Serum ethanol level was undetectable.   --monitor on CIWA protocol with as needed Ativan.  Substance use disorder: Patient admits to cocaine use, UDS is positive for cocaine.  Tobacco use: Nicotine patch ordered.  Severe protein calorie malnutrition BMI 15.51, physical exam with evidence of significant muscle wasting. --Currently n.p.o. with encephalopathy as above --Once mentation improves, will consult nutrition and speech therapy for  evaluation and further needs   DVT prophylaxis: SCDs, chemical DVT prophylaxis contraindicated with coagulopathy/acute liver failure  Code Status: DNR Family Communication: Updated patient's brother, Trey Paula by telephone this morning Disposition Plan: Continue inpatient hospitalization on progressive unit, further to be determined; grim prognosis   Consultants:   [employer] control  [facility]  Hepatology, Dr. Huston Foley  General medicine, Dr. Laurine Blazer  Procedures:   None  Antimicrobials:   None   Subjective:  Patient seen and examined at bedside, resting comfortably.  Continues to be encephalopathic.  Unable to obtain any further ROS secondary to patient's current encephalopathy.  Updated poison control this morning regarding interval events, recommend continue NAC for now.  Updated patient's brother, Trey Paula via telephone this morning and expressed grim prognosis given his hepatic failure and pending transfer to [facility]. No other concerns overnight per nursing staff.  Objective: Vitals:   02/20/19 1449 02/20/19 1651 02/20/19 2346 02/21/19 0526  BP: (!) 158/85 (!) 155/85 138/76 140/74  Pulse:  92 96 86  Resp: (!) 22 (!) 30 (!) 33 (!) 28  Temp: 97.6 F (36.4 C) (!) 97.4 F (36.3 C) 99.5 F (37.5 C) 98.5 F (36.9 C)  TempSrc: Axillary Axillary Oral Oral  SpO2: 96% 95% 95% 94%  Weight:      Height:        Intake/Output Summary (Last 24 hours) at 02/21/2019 1136 Last data filed at 02/21/2019 1610 Gross per 24 hour  Intake 3169.18 ml  Output 1450 ml  Net 1719.18 ml   Filed Weights   02/18/19 1945 02/19/19 0230  Weight: 61.2 kg 46.3 kg    Examination:  General exam:  Somnolent, confused; cachectic in appearance HEENT: Dried blood noted oral mucosa Respiratory system: Coarse breath sounds bilaterally, respiratory effort normal, on 2 L nasal cannula Cardiovascular system: S1 & S2 heard, 3/6 systolic ejection murmur left upper  sternal border. No JVD, rubs, gallops or clicks. No pedal edema. Gastrointestinal system: Abdomen is nondistended and scaphoid in appearance, soft and nontender. No organomegaly or masses felt. Normal bowel sounds heard. Central nervous system: Somnolent, opens eyes to commands, no focal neurological deficit Extremities: Symmetric 5 x 5 power. Skin: No rashes, lesions or ulcers Psychiatry: Somnolent, confused.     Data Reviewed: I have personally reviewed following labs and imaging studies  CBC: Recent Labs  Lab 02/18/19 1945 02/18/19 1959 02/19/19 0630 02/20/19 0504 02/21/19 0511  WBC 11.5*  --  10.2 5.8 4.6  NEUTROABS 10.6*  --   --   --   --   HGB 12.7* 12.9* 13.6 11.7* 10.9*  HCT 37.7* 38.0* 41.2 35.1* 31.8*  MCV 89.5  --  89.6 88.6 87.1  PLT 233  --  183 110* 98*   Basic Metabolic Panel: Recent Labs  Lab 02/18/19 2250 02/19/19 0305  02/19/19 1520 02/19/19 1719 02/20/19 0504 02/20/19 0808 02/20/19 1658 02/21/19 0511  NA  --   --    < > 150* 150* 148*  --  147* 146*  K  --   --    < > 3.3* 3.3* 2.6*  --  3.3* 2.6*  CL  --   --    < > 110 112* 112*  --  112* 113*  CO2  --   --    < > 27 28 24   --  22 23  GLUCOSE  --   --    < > 105* 91 190*  --  134* 143*  BUN  --   --    < > 29* 29* 32*  --  34* 34*  CREATININE  --   --    < > 1.48* 1.52* 2.04*  --  2.42* 2.38*  CALCIUM  --   --    < > 8.0* 8.0* 7.7*  --  7.9* 7.8*  MG 2.6* 2.8*  --   --   --   --  2.4  --  2.0   < > = values in this interval not displayed.   GFR: Estimated Creatinine Clearance: 23.2 mL/min (A) (by C-G formula based on SCr of 2.38 mg/dL (H)). Liver Function Tests: Recent Labs  Lab 02/19/19 1520 02/19/19 1719 02/20/19 0504 02/20/19 1658 02/21/19 0511  AST >10,000* >10,000* >10,000* 4,848* 1,879*  ALT 4,408* 4,565* 5,751* 3,045* 2,254*  ALKPHOS 150* 159* 170* 166* 193*  BILITOT 2.2* 2.4* 2.6* 3.4* 4.5*  PROT 5.3* 5.6* 4.4* 4.7* 4.6*  ALBUMIN 2.7* 2.8* 2.4* 2.6* 2.3*   Recent Labs    Lab 02/18/19 1945  LIPASE 19   Recent Labs  Lab 02/19/19 0646 02/20/19 0504  AMMONIA 59* 45*   Coagulation Profile: Recent Labs  Lab 02/19/19 0401 02/19/19 1719 02/20/19 0504 02/20/19 1658 02/21/19 0511  INR 3.0* 4.4* 3.4* 2.2* 1.9*   Cardiac Enzymes: No results for input(s): CKTOTAL, CKMB, CKMBINDEX, TROPONINI in the last 168 hours. BNP (last 3 results) No results for input(s): PROBNP in the last 8760 hours. HbA1C: Recent Labs    02/18/19 2307  HGBA1C 17.7*   CBG: Recent Labs  Lab 02/20/19 1556 02/20/19 2046 02/21/19 0012 02/21/19 0532 02/21/19 0806  GLUCAP 109* 117* 119* 137* 142*  Lipid Profile: No results for input(s): CHOL, HDL, LDLCALC, TRIG, CHOLHDL, LDLDIRECT in the last 72 hours. Thyroid Function Tests: No results for input(s): TSH, T4TOTAL, FREET4, T3FREE, THYROIDAB in the last 72 hours. Anemia Panel: No results for input(s): VITAMINB12, FOLATE, FERRITIN, TIBC, IRON, RETICCTPCT in the last 72 hours. Sepsis Labs: Recent Labs  Lab 02/21/19 0511  LATICACIDVEN 1.4    Recent Results (from the past 240 hour(s))  SARS Coronavirus 2 (CEPHEID - Performed in [facility] hospital lab), Hosp Order     Status: None   Collection Time: 02/18/19 10:38 PM   Specimen: Nasopharyngeal Swab  Result Value Ref Range Status   SARS Coronavirus 2 NEGATIVE NEGATIVE Final    Comment: (NOTE) If result is NEGATIVE SARS-CoV-2 target nucleic acids are NOT DETECTED. The SARS-CoV-2 RNA is generally detectable in upper and lower  respiratory specimens during the acute phase of infection. The lowest  concentration of SARS-CoV-2 viral copies this assay can detect is 250  copies / mL. A negative result does not preclude SARS-CoV-2 infection  and should not be used as the sole basis for treatment or other  patient management decisions.  A negative result may occur with  improper specimen collection / handling, submission of specimen other  than nasopharyngeal swab, presence  of viral mutation(s) within the  areas targeted by this assay, and inadequate number of viral copies  (<250 copies / mL). A negative result must be combined with clinical  observations, patient history, and epidemiological information. If result is POSITIVE SARS-CoV-2 target nucleic acids are DETECTED. The SARS-CoV-2 RNA is generally detectable in upper and lower  respiratory specimens dur ing the acute phase of infection.  Positive  results are indicative of active infection with SARS-CoV-2.  Clinical  correlation with patient history and other diagnostic information is  necessary to determine patient infection status.  Positive results do  not rule out bacterial infection or co-infection with other viruses. If result is PRESUMPTIVE POSTIVE SARS-CoV-2 nucleic acids MAY BE PRESENT.   A presumptive positive result was obtained on the submitted specimen  and confirmed on repeat testing.  While 2019 novel coronavirus  (SARS-CoV-2) nucleic acids may be present in the submitted sample  additional confirmatory testing may be necessary for epidemiological  and / or clinical management purposes  to differentiate between  SARS-CoV-2 and other Sarbecovirus currently known to infect humans.  If clinically indicated additional testing with an alternate test  methodology [phone]) is advised. The SARS-CoV-2 RNA is generally  detectable in upper and lower respiratory sp ecimens during the acute  phase of infection. The expected result is Negative. Fact Sheet for Patients:  BoilerBrush.com.cy Fact Sheet for Healthcare Providers: https://pope.com/ This test is not yet approved or cleared by the [place] FDA and has been authorized for detection and/or diagnosis of SARS-CoV-2 by FDA under an Emergency Use Authorization (EUA).  This EUA will remain in effect (meaning this test can be used) for the duration of the COVID-19 declaration under Section  564(b)(1) of the Act, 21 U.S.C. section 360bbb-3(b)(1), unless the authorization is terminated or revoked sooner. Performed at [facility], [address], [place], [place] [address]   MRSA PCR Screening     Status: None   Collection Time: 02/19/19  2:50 AM   Specimen: Nasal Mucosa; Nasopharyngeal  Result Value Ref Range Status   MRSA by PCR NEGATIVE NEGATIVE Final    Comment:        The GeneXpert MRSA Assay (FDA approved for  NASAL specimens only), is one component of a comprehensive MRSA colonization surveillance program. It is not intended to diagnose MRSA infection nor to guide or monitor treatment for MRSA infections. Performed at [facility], [address], [place], [place] [address]          Radiology Studies: Ct Head Wo Contrast  Result Date: 02/21/2019 CLINICAL DATA:  Altered mental status EXAM: CT HEAD WITHOUT CONTRAST TECHNIQUE: Contiguous axial images were obtained from the base of the skull through the vertex without intravenous contrast. COMPARISON:  December 02, 2018 FINDINGS: Brain: Ventricles and sulci are within normal limits. There is no intracranial mass, hemorrhage, extra-axial fluid collection, or midline shift. Brain parenchyma appears unremarkable. No acute infarct evident. Vascular: There is no hyperdense vessel. There is calcification in each carotid siphon region. Skull: Bony calvarium appears intact. Sinuses/Orbits: There are foci of opacification in each maxillary antrum, more notable on the left than on the right. There is mucosal thickening in several ethmoid air cells. Orbits appear symmetric bilaterally. Other: There is a small air-fluid level in a posteroinferior mastoid air cell on the left. Mastoids elsewhere are clear. IMPRESSION: Brain parenchyma appears unremarkable.  No mass or hemorrhage. There are foci of arterial vascular calcification. There are areas of paranasal sinus disease. A small air-fluid level is noted in inferior  posterior left mastoid air cell. Other mastoids are clear. Electronically Signed   By: Bretta Bang III M.D.   On: 02/21/2019 11:03   Dg Chest Port 1 View  Result Date: 02/19/2019 CLINICAL DATA:  Cough EXAM: PORTABLE CHEST 1 VIEW COMPARISON:  12/02/2018 FINDINGS: Heart and mediastinal contours are within normal limits. No focal opacities or effusions. No acute bony abnormality. IMPRESSION: No active disease. Electronically Signed   By: Charlett Nose M.D.   On: 02/19/2019 17:42        Scheduled Meds:  chlorhexidine  15 mL Mouth Rinse BID   cloNIDine  0.1 mg Transdermal Weekly   folic acid  1 mg Oral Daily   insulin aspart  0-9 Units Subcutaneous TID WC   insulin glargine  10 Units Subcutaneous Daily   lactulose  300 mL Rectal BID   mouth rinse  15 mL Mouth Rinse q12n4p   multivitamin with minerals  1 tablet Oral Daily   nicotine  14 mg Transdermal Daily   thiamine  100 mg Oral Daily   Continuous Infusions:  sodium chloride Stopped (02/19/19 1900)   dextrose 5 % and 0.45% NaCl 100 mL/hr at 02/21/19 0600   potassium chloride 10 mEq (02/21/19 1029)     LOS: 3 days    Critical Care Time Upon my evaluation, this patient had a high probability of imminent or life-threatening deterioration due to acute hepatic failure secondary to Tylenol toxicity, acute renal failure, acute metabolic encephalopathy, diabetic ketoacidosis, coagulopathy which required my direct attention, intervention, and personal management.  I have personally provided 39 minutes of critical care time exclusive of my time spent on separately billable procedures.  Time includes review of laboratory data, radiology results, discussion with consultants, and monitoring for potential decompensation.       Alvira Philips [place], DO [facility] Pager [phone]  If 7PM-7AM, please contact night-coverage [email or web address] Password [facility] 02/21/2019, 11:36 AM

## 2019-02-21 NOTE — Consult Note (Signed)
Consultation Note Date: 02/21/2019   Patient Name: Rodney Barber  DOB: August 27, 1965  MRN: [id number]  Age / Sex: 53 y.o., male  PCP: Gildardo Pounds, NP Referring Physician: [place] Eric J, DO  Reason for Consultation: Establishing goals of care and Psychosocial/spiritual support  HPI/Patient Profile: 53 y.o. male  with past medical history of uncontrolled diabetes (recent hemoglobin A1c 17.7),, hypertension, chronic pancreatitis, biliary stenosis, alcohol use disorder in remission, polysubstance use disorder, admitted on 02/18/2019 with nausea and vomiting.  Patient was found to have markedly elevated liver enzymes: AST 5859, ALT 2091, acute kidney injury creatinine 2.44 as well as Tylenol toxicity, 135. Despite supportive interventions interventions as well as medications to address Tylenol toxicity, laboratory values are improving, but patient remains encephalopathic  Consult ordered for goals of care in the setting of hepatic failure, Tylenol toxicity, DKA, coagulopathy.  Clinical Assessment and Goals of Care: Patient seen, chart reviewed.  Staffed with Dr. [place]  Patient is unable to participate in assessment.  He does not follow commands.  Mild psychomotor restlessness observed.  There has been no further bleeding or nausea and vomiting charted.  Tylenol level is now back to normal, INR 1.9, platelets 98, AST has improved but still very elevated, 1879, ALT 2254.  Patient has had significant stress over the past couple of months.  Not only like [place] of [place] dealing with [place], his mother died in December 04, 2022 and his other brother died a few days afterwards in 12/04/2022.  They all live together and since that time he has been alone.  He does have another brother and sister who live locally.  I spoke with his brother, Brendt Dible at [phone].  Introduced palliative medicine services as an  additional resource and source of support with the focus on quality and patient directed goals of care.  Mr. Wint confirms that patient is a DNR.  He inquired as to his weight and I told him his current weight is 101 pounds, BMI 15.51.  Per chart review in 2014, CT of the abdomen, showed liver lesions.  His brother states patient is afraid of doctors and never followed up.  His brother is also questioning whether he has cancer.  Also per chart review see that transfer to [place] is being considered.  Patient is not a candidate for a liver transplant because of substance use disorder.  Per Dr. [place] he has spoken to a Dr. Hassell Done who recommends transfer to a tertiary care center for further evaluation by hepatologist.  Patient is unmarried with no children.  He does have a surviving brother and sister who would be his healthcare proxy's    SUMMARY OF RECOMMENDATIONS   Confirmed DNR/DNI Family is agreeable to transfer to [facility] or other tertiary center that provides further support for end-stage liver disease, hepatologist Family also agreeable to temporary feeding measures if medically indicated via cor track Palliative medicine to stay involved.  Patient looks critically ill and I will try to arrange visitation for his brother and sister over the  weekend Code Status/Advance Care Planning:  DNR   Palliative Prophylaxis:   Aspiration, Bowel Regimen, Delirium Protocol, Frequent Pain Assessment, Oral Care and Turn Reposition   Psycho-social/Spiritual:   Desire for further Chaplaincy support:no  Additional Recommendations: Referral to Community Resources   Prognosis:   Unable to determine  Discharge Planning: To Be Determined      Primary Diagnoses: Present on Admission: . Shock liver . AKI (acute kidney injury) ([place]) . Hypokalemia . Chronic pancreatitis ([place])   I have reviewed the medical record, interviewed the patient and family, and examined the patient. The following  aspects are pertinent.  Past Medical History:  Diagnosis Date  . Diabetes mellitus   . Hypertension   . Pancreatitis    Social History   Socioeconomic History  . Marital status: Single    Spouse name: Not on file  . Number of children: Not on file  . Years of education: Not on file  . Highest education level: Not on file  Occupational History  . Not on file  Social Needs  . Financial resource strain: Not on file  . Food insecurity    Worry: Not on file    Inability: Not on file  . Transportation needs    Medical: Not on file    Non-medical: Not on file  Tobacco Use  . Smoking status: Current Every Day Smoker    Packs/day: 0.50    Years: 30.00    Pack years: 15.00    Types: Cigarettes  . Smokeless tobacco: Never Used  . Tobacco comment: 6 cigarett /day  Substance and Sexual Activity  . Alcohol use: No  . Drug use: Yes    Types: "Crack" cocaine, Other-see comments, Cocaine    Comment: last smoked crack 12-16-16  . Sexual activity: Never  Lifestyle  . Physical activity    Days per week: Not on file    Minutes per session: Not on file  . Stress: Not on file  Relationships  . Social Herbalist on phone: Not on file    Gets together: Not on file    Attends religious service: Not on file    Active member of club or organization: Not on file    Attends meetings of clubs or organizations: Not on file    Relationship status: Not on file  Other Topics Concern  . Not on file  Social History Narrative  . Not on file   Family History  Problem Relation Age of Onset  . Diabetes Mother   . Diabetes Brother    Scheduled Meds: . chlorhexidine  15 mL Mouth Rinse BID  . cloNIDine  0.1 mg Transdermal Weekly  . folic acid  1 mg Oral Daily  . insulin aspart  0-9 Units Subcutaneous TID WC  . insulin glargine  10 Units Subcutaneous Daily  . lactulose  300 mL Rectal BID  . mouth rinse  15 mL Mouth Rinse q12n4p  . multivitamin with minerals  1 tablet Oral Daily  .  nicotine  14 mg Transdermal Daily  . thiamine  100 mg Oral Daily   Continuous Infusions: . sodium chloride Stopped (02/19/19 1900)  . dextrose 5 % and 0.45% NaCl 100 mL/hr at 02/21/19 1152   PRN Meds:.hydrALAZINE, LORazepam, promethazine Medications Prior to Admission:  Prior to Admission medications   Medication Sig Start Date End Date Taking? Authorizing Provider  Blood Glucose Monitoring Suppl (TRUERESULT BLOOD GLUCOSE) w/Device KIT uad 04/23/17  Yes Alfonse Spruce, FNP  escitalopram (LEXAPRO) 10 MG tablet Take 0.5 tablets (5 mg total) by mouth daily for 30 days. 12/17/18 02/18/19 Yes Kerin Perna, NP  glipiZIDE (GLUCOTROL) 5 MG tablet Take 1 tablet (5 mg total) by mouth 2 (two) times daily before a meal. 12/17/18  Yes Kerin Perna, NP  glucose blood (TRUE METRIX BLOOD GLUCOSE TEST) test strip Use as instructed 12/17/18  Yes Kerin Perna, NP  insulin detemir (LEVEMIR) 100 UNIT/ML injection Inject 0.25 mLs (25 Units total) into the skin at bedtime. 12/17/18  Yes Kerin Perna, NP  lipase/protease/amylase (CREON) 12000 units CPEP capsule Take 1 capsule (12,000 Units total) by mouth 3 (three) times daily with meals. 12/17/18  Yes Kerin Perna, NP  lisinopril (ZESTRIL) 5 MG tablet Take 1 tablet (5 mg total) by mouth daily. 12/17/18  Yes Kerin Perna, NP  TRUEPLUS LANCETS 28G MISC uad 04/23/17  Yes Alfonse Spruce, FNP   No Known Allergies Review of Systems  Unable to perform ROS: Acuity of condition    Physical Exam Vitals signs and nursing note reviewed.  Constitutional:      Comments: Cachectic, acutely ill-appearing older man Encephalopathic  HENT:     Head: Normocephalic and atraumatic.  Cardiovascular:     Rate and Rhythm: Normal rate.  Pulmonary:     Effort: Pulmonary effort is normal.  Genitourinary:    Comments: Foley catheter Neurological:     Comments: Patient is encephalopathic Not able to follow commands or converse   Psychiatric:     Comments: Psychomotor restlessness observed otherwise unable to test     Vital Signs: BP (!) 143/79   Pulse 86   Temp 98.5 F (36.9 C) (Oral)   Resp (!) 29   Ht _0  (1.727 m)   Wt 46.3 kg   SpO2 96%   BMI 15.51 kg/m  Pain Scale: PAINAD POSS *See Group Information*: 2-Acceptable,Slightly drowsy, easily aroused Pain Score: 0-No pain   SpO2: SpO2: 96 % O2 Device:SpO2: 96 % O2 Flow Rate: .O2 Flow Rate (L/min): 2 L/min  IO: Intake/output summary:   Intake/Output Summary (Last 24 hours) at 02/21/2019 1358 Last data filed at 02/21/2019 1209 Gross per 24 hour  Intake 2969.18 ml  Output 2050 ml  Net 919.18 ml    LBM: Last BM Date: 02/20/19(on night shift ) Baseline Weight: Weight: 61.2 kg Most recent weight: Weight: 46.3 kg     Palliative Assessment/Data:   Flowsheet Rows     Most Recent Value  Intake Tab  Referral Department  Hospitalist  Unit at Time of Referral  Med/Surg Unit  Palliative Care Primary Diagnosis  Other (Comment)  Date Notified  02/21/19  Palliative Care Type  New Palliative care  Reason for referral  Clarify Goals of Care, Psychosocial or Spiritual support  Date of Admission  02/18/19  Date first seen by Palliative Care  02/21/19  # of days Palliative referral response time  0 Day(s)  # of days IP prior to Palliative referral  3  Clinical Assessment  Palliative Performance Scale Score  30%  Pain Max last 24 hours  Not able to report  Pain Min Last 24 hours  Not able to report  Dyspnea Max Last 24 Hours  Not able to report  Dyspnea Min Last 24 hours  Not able to report  Nausea Max Last 24 Hours  Not able to report  Nausea Min Last 24 Hours  Not able to report  Anxiety Max Last 24 Hours  Not able to report  Anxiety Min Last 24 Hours  Not able to report  Other Max Last 24 Hours  Not able to report  Psychosocial & Spiritual Assessment  Palliative Care Outcomes  Patient/Family wishes: Interventions discontinued/not started    Mechanical Ventilation, Trach  Palliative Care follow-up planned  Yes, Facility      Time In: 1000 Time Out: 1015 Time Total: 75 min Greater than 50%  of this time was spent counseling and coordinating care related to the above assessment and plan.  Signed by: Dory Horn, NP   Please contact Palliative Medicine Team phone at [phone] for questions and concerns.  For individual provider: See Shea Evans

## 2019-02-21 NOTE — Progress Notes (Signed)
Note From Conversation with Dr. Wynonia Sours, Toxicologist, at [facility] (02/21/19 @2115  PM):  Dr. Hassell Done would like to restart n-acetylcysteine (NAC) for Rodney Barber: NAC load of 150 mg/kg (since the infusion has been discontinued for >8 hrs) NAC infusion of 15 mg/kg/hr  Dr. Hassell Done stated that although there are improvements in the patient's INR and LFTs, he is still encephalopathic and the value of the INR to depict hepatic synthetic function is altered by the FFP administered to the patient. She would like to see the patient's mental status improve and both ALT and AST to decrease to <1000 each.  Patient has orders for continued monitoring of acetaminophen level, ammonia, INR and CMET (Q 12 hrs).  Rodney Barber, PharmD, BCPS, [facility] Clinical Pharmacist   [facility] Control Phone: [phone]

## 2019-02-21 NOTE — Progress Notes (Signed)
Received call from [place] control. Poison control representative recommended acetylcysteine loading dose of 150 mg /kg  to be infused  for 1 hour and infusion to be  restarted at the previous rate  of  17.36 ml /hr. Hospitalist  Np notified .

## 2019-02-21 NOTE — Progress Notes (Signed)
CRITICAL VALUE ALERT  Critical Value:   K =2.6  Date & Time Notied:02/21/2019  Provider Notified: [place] ERIC  Orders Received/Actions taken: Waiting

## 2019-02-22 ENCOUNTER — Inpatient Hospital Stay (HOSPITAL_COMMUNITY): Payer: Self-pay

## 2019-02-22 LAB — AMMONIA: Ammonia: 43 umol/L — ABNORMAL HIGH (ref 9–35)

## 2019-02-22 LAB — GLUCOSE, CAPILLARY
Glucose-Capillary: 107 mg/dL — ABNORMAL HIGH (ref 70–99)
Glucose-Capillary: 83 mg/dL (ref 70–99)
Glucose-Capillary: 90 mg/dL (ref 70–99)
Glucose-Capillary: 131 mg/dL — ABNORMAL HIGH (ref 70–99)
Glucose-Capillary: 186 mg/dL — ABNORMAL HIGH (ref 70–99)

## 2019-02-22 LAB — PROTIME-INR
INR: 1.7 — ABNORMAL HIGH (ref 0.8–1.2)
Prothrombin Time: 19.5 seconds — ABNORMAL HIGH (ref 11.4–15.2)

## 2019-02-22 LAB — CBC
HCT: 30 % — ABNORMAL LOW (ref 39.0–52.0)
Hemoglobin: 10.1 g/dL — ABNORMAL LOW (ref 13.0–17.0)
MCH: 29.9 pg (ref 26.0–34.0)
MCHC: 33.7 g/dL (ref 30.0–36.0)
MCV: 88.8 fL (ref 80.0–100.0)
Platelets: 91 10*3/uL — ABNORMAL LOW (ref 150–400)
RBC: 3.38 MIL/uL — ABNORMAL LOW (ref 4.22–5.81)
RDW: 13.5 % (ref 11.5–15.5)
WBC: 5 10*3/uL (ref 4.0–10.5)
nRBC: 0.8 % — ABNORMAL HIGH (ref 0.0–0.2)

## 2019-02-22 LAB — PROTEIN / CREATININE RATIO, URINE
Creatinine, Urine: 81.16 mg/dL
Protein Creatinine Ratio: 0.6 mg/mg{Cre} — ABNORMAL HIGH (ref 0.00–0.15)
Total Protein, Urine: 49 mg/dL

## 2019-02-22 LAB — COMPREHENSIVE METABOLIC PANEL
ALT: 1244 U/L — ABNORMAL HIGH (ref 0–44)
AST: 421 U/L — ABNORMAL HIGH (ref 15–41)
Albumin: 2 g/dL — ABNORMAL LOW (ref 3.5–5.0)
Alkaline Phosphatase: 250 U/L — ABNORMAL HIGH (ref 38–126)
Anion gap: 9 (ref 5–15)
BUN: 32 mg/dL — ABNORMAL HIGH (ref 6–20)
CO2: 21 mmol/L — ABNORMAL LOW (ref 22–32)
Calcium: 7.7 mg/dL — ABNORMAL LOW (ref 8.9–10.3)
Chloride: 118 mmol/L — ABNORMAL HIGH (ref 98–111)
Creatinine, Ser: 2.24 mg/dL — ABNORMAL HIGH (ref 0.61–1.24)
GFR calc Af Amer: 37 mL/min — ABNORMAL LOW (ref >60)
GFR calc non Af Amer: 32 mL/min — ABNORMAL LOW (ref >60)
Glucose, Bld: 104 mg/dL — ABNORMAL HIGH (ref 70–99)
Potassium: 2.5 mmol/L — CL (ref 3.5–5.1)
Sodium: 148 mmol/L — ABNORMAL HIGH (ref 135–145)
Total Bilirubin: 5.2 mg/dL — ABNORMAL HIGH (ref 0.3–1.2)
Total Protein: 4.5 g/dL — ABNORMAL LOW (ref 6.5–8.1)

## 2019-02-22 LAB — URINALYSIS, ROUTINE W REFLEX MICROSCOPIC
Bilirubin Urine: NEGATIVE
Glucose, UA: 50 mg/dL — AB
Ketones, ur: NEGATIVE mg/dL
Nitrite: POSITIVE — AB
Protein, ur: 30 mg/dL — AB
RBC / HPF: >50 RBC/hpf — ABNORMAL HIGH (ref 0–5)
Specific Gravity, Urine: 1.012 (ref 1.005–1.030)
WBC, UA: >50 WBC/hpf — ABNORMAL HIGH (ref 0–5)
pH: 5 (ref 5.0–8.0)

## 2019-02-22 LAB — CHLORIDE, URINE, RANDOM: Chloride Urine: 28 mmol/L

## 2019-02-22 LAB — NA AND K (SODIUM & POTASSIUM), RAND UR
Potassium Urine: 53 mmol/L
Sodium, Ur: 24 mmol/L

## 2019-02-22 LAB — MAGNESIUM: Magnesium: 1.7 mg/dL (ref 1.7–2.4)

## 2019-02-22 LAB — POTASSIUM: Potassium: 2.9 mmol/L — ABNORMAL LOW (ref 3.5–5.1)

## 2019-02-22 LAB — LACTIC ACID, PLASMA: Lactic Acid, Venous: 1.5 mmol/L (ref 0.5–1.9)

## 2019-02-22 LAB — CREATININE, URINE, RANDOM: Creatinine, Urine: 83.17 mg/dL

## 2019-02-22 LAB — ACETAMINOPHEN LEVEL: Acetaminophen (Tylenol), Serum: <10 ug/mL — ABNORMAL LOW (ref 10–30)

## 2019-02-22 MED ORDER — POTASSIUM CHLORIDE 10 MEQ/100ML IV SOLN
10.0000 meq | INTRAVENOUS | Status: AC
  Administered 2019-02-22 (×6): 10 meq via INTRAVENOUS
  Filled 2019-02-22 (×6): qty 100

## 2019-02-22 MED ORDER — HYDRALAZINE HCL 20 MG/ML IJ SOLN
10.0000 mg | Freq: Four times a day (QID) | INTRAMUSCULAR | Status: DC | PRN
  Administered 2019-02-22: 10 mg via INTRAVENOUS
  Filled 2019-02-22: qty 1

## 2019-02-22 MED ORDER — POTASSIUM CHLORIDE 2 MEQ/ML IV SOLN
INTRAVENOUS | Status: DC
  Administered 2019-02-22 (×2): via INTRAVENOUS
  Filled 2019-02-22 (×3): qty 1000

## 2019-02-22 MED ORDER — POTASSIUM CHLORIDE 20 MEQ PO PACK
40.0000 meq | PACK | Freq: Three times a day (TID) | ORAL | Status: DC
  Filled 2019-02-22 (×3): qty 2

## 2019-02-22 MED ORDER — CLONIDINE HCL 0.2 MG/24HR TD PTWK
0.2000 mg | MEDICATED_PATCH | TRANSDERMAL | Status: DC
  Administered 2019-02-22 – 2019-03-01 (×2): 0.2 mg via TRANSDERMAL
  Filled 2019-02-22 (×2): qty 1

## 2019-02-22 MED ORDER — HYDRALAZINE HCL 20 MG/ML IJ SOLN: 10.0000 mg | Freq: Four times a day (QID) | INTRAMUSCULAR | Status: DC | PRN

## 2019-02-22 MED ORDER — MAGNESIUM SULFATE IN D5W 1-5 GM/100ML-% IV SOLN
1.0000 g | Freq: Once | INTRAVENOUS | Status: AC
  Administered 2019-02-22: 1 g via INTRAVENOUS
  Filled 2019-02-22: qty 100

## 2019-02-22 NOTE — Progress Notes (Addendum)
PROGRESS NOTE                                                                                                                                                                                                             Patient Demographics:    Rodney Barber, is a 53 y.o. male, DOB - 09-08-1965, EAV:[id number]  Admit date - 02/18/2019   Admitting Physician Charlsie QuestVishal R Patel, MD  Outpatient Primary MD for the patient is Claiborne RiggFleming, Zelda W, NP  LOS - 4  Chief Complaint  Patient presents with  . Hyperglycemia    CBG=526       Brief Narrative  Onalee HuaDavid Richardsonis a 53 y.o.malewith medical history significant foruncontrolled insulin-dependent type 2 diabetes, hypertension, chronic pancreatitis with history of chronic biliary stenosis, tobacco use, alcohol use, and substance use disorder who presents to the ED for evaluation of persistent nausea and vomiting.  Was found to have DKA, extremely elevated LFTs and possible acetaminophen toxicity with fulminant acute liver failure and acute hepatitis and admitted to the hospital.   Subjective:    Rodney Mayaavid Bias today remains obtunded.   Assessment  & Plan :     1.  Acute hepatic and metabolic encephalopathy from DKA along with Acute liver failure, acute hepatitis and acetaminophen toxicity.  He is still obtunded, he has been treated with IV acetylcysteine under the guidance of poison control, LFTs and INR have improved on 02/22/2019 and Poison control has advised that acetylcysteine be stopped.  Continue hydration and supportive care, continue monitoring ammonia levels, head CT is unremarkable.  Continue gentle IV fluids and monitor closely.  2.  Acute liver failure induced coagulopathy.  INR now trending down, had peaked at 4.4.  Continue to monitor.  3.  Severe hypokalemia.  Replaced will monitor.  4.  AKI.  Due to combination of dehydration and bladder outlet obstruction.  Hydrate, Foley, renal failure function is not improving  and seems to have plateaued around 2.5, get renal ultrasound, continue IV fluids and consult nephrology.   5.  History of chronic pancreatitis.  Stable lipase.  GI to follow.  Has history of CBD stent.  Will follow with Dr. Ewing SchleinMagod for it.  6.  History of substance abuse.  Uses cocaine and alcohol.  He was counseled to quit both.  No signs of DTs.  Monitor.  On CIWA protocol but for now hold any further Ativan since he is obtunded.  7. Severe PCM - dietary consult once more awake.  8. DKA  DM2 - Lantus + iSS. Poor outpatient control due to hyperglycemia.  Lab Results  Component Value Date   HGBA1C 17.7 (H) 02/18/2019    CBG (last 3)  Recent Labs    02/22/19 0331 02/22/19 0813 02/22/19 1155  GLUCAP 107* 83 90     Family Communication  : brother 02/22/19  Code Status :  DNR  Disposition Plan  :  Tele.  Consults  :    Renal  GI - Dr Janeece AgeeEdwards   [place] Poison control   [facility] ? Hepatology, Dr. Huston FoleyBrady ? General medicine, Dr. Laurine BlazerWalters  Procedures  :    Renal US -   RUQ US - 1. Positive for cholelithiasis, borderline to mild gallbladder wall thickening, borderline to mildly dilated CBD, and trace pericholecystic fluid. No sonographic Eulah PontMurphy sign was elicited, and there is no intrahepatic biliary dilatation to strongly suggest acute duct obstruction. However, consider the possibility of choledocholithiasis. MRCP versus Nuclear Medicine Hepatobiliary Scan could be valuable in this clinical setting. 2. Nephrolithiasis. No other acute ultrasound finding in the abdomen.  CT head - Non acute    DVT Prophylaxis  :    SCDs   Lab Results  Component Value Date   PLT 91 (L) 02/22/2019    Diet :  Diet Order            Diet NPO time specified Except for: Ice Chips  Diet effective now               Inpatient Medications Scheduled Meds: . chlorhexidine  15 mL Mouth Rinse BID  . cloNIDine  0.1 mg Transdermal Weekly  . folic acid  1 mg Oral Daily   . insulin aspart  0-9 Units Subcutaneous TID WC  . lactulose  300 mL Rectal BID  . mouth rinse  15 mL Mouth Rinse q12n4p  . multivitamin with minerals  1 tablet Oral Daily  . nicotine  14 mg Transdermal Daily  . potassium chloride  40 mEq Oral TID  . thiamine  100 mg Oral Daily   Continuous Infusions: . dextrose 5 % and 0.45% NaCl Stopped (02/22/19 0821)  . potassium chloride 10 mEq (02/22/19 1131)   PRN Meds:.hydrALAZINE, LORazepam, promethazine  Antibiotics  :   Anti-infectives (From admission, onward)   None          Objective:   Vitals:   02/21/19 2350 02/22/19 0329 02/22/19 0415 02/22/19 0519  BP: (!) 174/88  (!) 152/91   Pulse: (!) 107  92 87  Resp: (!) 25  (!) 33 (!) 40  Temp: 98.4 F (36.9 C) 98.4 F (36.9 C) 99 F (37.2 C)   TempSrc: Oral Oral Oral   SpO2: 95%  95% 95%  Weight:    49.3 kg  Height:        Wt Readings from Last 3 Encounters:  02/22/19 49.3 kg  12/04/18 59 kg  03/15/18 53 kg     Intake/Output Summary (Last 24 hours) at 02/22/2019 1142 Last data filed at 02/22/2019 [id number] Gross per 24 hour  Intake 2863.06 ml  Output 2000 ml  Net 863.06 ml     Physical Exam  Somnolent, No new F.N deficits,   [place].AT,PERRAL Supple Neck,No JVD, No cervical lymphadenopathy appriciated.  Symmetrical Chest wall movement, Good air movement bilaterally, CTAB RRR,No Gallops,Rubs or new Murmurs, No Parasternal Heave +ve B.Sounds, Abd Soft, No tenderness, No organomegaly appriciated, No rebound - guarding or rigidity. No Cyanosis, Clubbing or edema,  Data Review:    CBC Recent Labs  Lab 02/18/19 1945 02/18/19 1959 02/19/19 0630 02/20/19 0504 02/21/19 0511 02/22/19 0433  WBC 11.5*  --  10.2 5.8 4.6 5.0  HGB 12.7* 12.9* 13.6 11.7* 10.9* 10.1*  HCT 37.7* 38.0* 41.2 35.1* 31.8* 30.0*  PLT 233  --  183 110* 98* 91*  MCV 89.5  --  89.6 88.6 87.1 88.8  MCH 30.2  --  29.6 29.5 29.9 29.9  MCHC 33.7  --  33.0 33.3 34.3 33.7  RDW 13.3  --  13.2 13.3  13.2 13.5  LYMPHSABS 0.7  --   --   --   --   --   MONOABS 0.1  --   --   --   --   --   EOSABS 0.0  --   --   --   --   --   BASOSABS 0.0  --   --   --   --   --     Chemistries  Recent Labs  Lab 02/18/19 2250 02/19/19 0305  02/20/19 0504 02/20/19 0808 02/20/19 1658 02/21/19 0511 02/21/19 1653 02/22/19 0433  NA  --   --    < > 148*  --  147* 146* 148* 148*  K  --   --    < > 2.6*  --  3.3* 2.6* 3.5 2.5*  CL  --   --    < > 112*  --  112* 113* 115* 118*  CO2  --   --    < > 24  --  22 23 23  21*  GLUCOSE  --   --    < > 190*  --  134* 143* 66* 104*  BUN  --   --    < > 32*  --  34* 34* 34* 32*  CREATININE  --   --    < > 2.04*  --  2.42* 2.38* 2.35* 2.24*  CALCIUM  --   --    < > 7.7*  --  7.9* 7.8* 7.7* 7.7*  MG 2.6* 2.8*  --   --  2.4  --  2.0  --  1.7  AST  --   --    < > >10,000*  --  4,848* 1,879* 855* 421*  ALT  --   --    < > 5,751*  --  3,045* 2,254* 1,706* 1,244*  ALKPHOS  --   --    < > 170*  --  166* 193* 216* 250*  BILITOT  --   --    < > 2.6*  --  3.4* 4.5* 5.0* 5.2*   < > = values in this interval not displayed.   ------------------------------------------------------------------------------------------------------------------ No results for input(s): CHOL, HDL, LDLCALC, TRIG, CHOLHDL, LDLDIRECT in the last 72 hours.  Lab Results  Component Value Date   HGBA1C 17.7 (H) 02/18/2019   ------------------------------------------------------------------------------------------------------------------ No results for input(s): TSH, T4TOTAL, T3FREE, THYROIDAB in the last 72 hours.  Invalid input(s): FREET3 ------------------------------------------------------------------------------------------------------------------ No results for input(s): VITAMINB12, FOLATE, FERRITIN, TIBC, IRON, RETICCTPCT in the last 72 hours.  Coagulation profile Recent Labs  Lab 02/20/19 0504 02/20/19 1658 02/21/19 0511 02/21/19 1653 02/22/19 0433  INR 3.4* 2.2* 1.9* 1.6* 1.7*     No results for input(s): DDIMER in the last 72 hours.  Cardiac Enzymes No results for input(s): CKMB, TROPONINI, MYOGLOBIN in the last 168 hours.  Invalid input(s): CK ------------------------------------------------------------------------------------------------------------------ No results found for: BNP  Micro Results  Recent Results (from the past 240 hour(s))  SARS Coronavirus 2 (CEPHEID - Performed [facility]), Hosp Order     Status: None   Collection Time: 02/18/19 10:38 PM   Specimen: Nasopharyngeal Swab  Result Value Ref Range Status   SARS Coronavirus 2 NEGATIVE NEGATIVE Final    Comment: (NOTE) If result is NEGATIVE SARS-CoV-2 target nucleic acids are NOT DETECTED. The SARS-CoV-2 RNA is generally detectable in upper and lower  respiratory specimens during the acute phase of infection. The lowest  concentration of SARS-CoV-2 viral copies this assay can detect is 250  copies / mL. A negative result does not preclude SARS-CoV-2 infection  and should not be used as the sole basis for treatment or other  patient management decisions.  A negative result may occur with  improper specimen collection / handling, submission of specimen other  than nasopharyngeal swab, presence of viral mutation(s) within the  areas targeted by this assay, and inadequate number of viral copies  (<250 copies / mL). A negative result must be combined with clinical  observations, patient history, and epidemiological information. If result is POSITIVE SARS-CoV-2 target nucleic acids are DETECTED. The SARS-CoV-2 RNA is generally detectable in upper and lower  respiratory specimens dur ing the acute phase of infection.  Positive  results are indicative of active infection with SARS-CoV-2.  Clinical  correlation with patient history and other diagnostic information is  necessary to determine patient infection status.  Positive results do  not rule out bacterial infection or  co-infection with other viruses. If result is PRESUMPTIVE POSTIVE SARS-CoV-2 nucleic acids MAY BE PRESENT.   A presumptive positive result was obtained on the submitted specimen  and confirmed on repeat testing.  While 2019 novel coronavirus  (SARS-CoV-2) nucleic acids may be present in the submitted sample  additional confirmatory testing may be necessary for epidemiological  and / or clinical management purposes  to differentiate between  SARS-CoV-2 and other Sarbecovirus currently known to infect humans.  If clinically indicated additional testing with an alternate test  methodology [phone]([id number]) is advised. The SARS-CoV-2 RNA is generally  detectable in upper and lower respiratory sp ecimens during the acute  phase of infection. The expected result is Negative. Fact Sheet for Patients:  BoilerBrush.com.cyhttps://www.fda.gov/media/136312/download Fact Sheet for Healthcare Providers: https://pope.com/https://www.fda.gov/media/136313/download This test is not yet approved or cleared by the [place] FDA and has been authorized for detection and/or diagnosis of SARS-CoV-2 by FDA under an Emergency Use Authorization (EUA).  This EUA will remain in effect (meaning this test can be used) for the duration of the COVID-19 declaration under Section 564(b)(1) of the Act, 21 U.S.C. section 360bbb-3(b)(1), unless the authorization is terminated or revoked sooner. Performed at [facility], [address] [place], [place] [address]   MRSA PCR Screening     Status: None   Collection Time: 02/19/19  2:50 AM   Specimen: Nasal Mucosa; Nasopharyngeal  Result Value Ref Range Status   MRSA by PCR NEGATIVE NEGATIVE Final    Comment:        The GeneXpert MRSA Assay (FDA approved for NASAL specimens only), is one component of a comprehensive MRSA colonization surveillance program. It is not intended to diagnose MRSA infection nor to guide or monitor treatment for MRSA infections. Performed at [facility],  [address] [place], [place] [address]     Radiology Reports Ct Head Wo Contrast  Result Date: 02/21/2019 CLINICAL DATA:  Altered mental status EXAM: CT  HEAD WITHOUT CONTRAST TECHNIQUE: Contiguous axial images were obtained from the base of the skull through the vertex without intravenous contrast. COMPARISON:  December 02, 2018 FINDINGS: Brain: Ventricles and sulci are within normal limits. There is no intracranial mass, hemorrhage, extra-axial fluid collection, or midline shift. Brain parenchyma appears unremarkable. No acute infarct evident. Vascular: There is no hyperdense vessel. There is calcification in each carotid siphon region. Skull: Bony calvarium appears intact. Sinuses/Orbits: There are foci of opacification in each maxillary antrum, more notable on the left than on the right. There is mucosal thickening in several ethmoid air cells. Orbits appear symmetric bilaterally. Other: There is a small air-fluid level in a posteroinferior mastoid air cell on the left. Mastoids elsewhere are clear. IMPRESSION: Brain parenchyma appears unremarkable.  No mass or hemorrhage. There are foci of arterial vascular calcification. There are areas of paranasal sinus disease. A small air-fluid level is noted in inferior posterior left mastoid air cell. Other mastoids are clear. Electronically Signed   By: Bretta Bang III M.D.   On: 02/21/2019 11:03   US Abdomen Complete  Result Date: 02/19/2019 CLINICAL DATA:  53 year old male with abnormal LFTs. EXAM: ABDOMEN ULTRASOUND COMPLETE COMPARISON:  Abdomen ultrasound 12/02/2018. CT Abdomen and Pelvis 07/10/2013. FINDINGS: Gallbladder: A shadowing echogenic stone is identified on image 5, about 6 millimeters. Gallbladder wall thickness is at the upper limits of normal to mildly increased. There is trace pericholecystic fluid on image 14. No sonographic Murphy sign elicited. Decreased sludge compared to the prior ultrasound. Common bile duct: Diameter: 7 millimeters,  increased since April and mildly abnormal. No filling defect identified within the visible duct. Liver: No intrahepatic biliary ductal dilatation. Liver echogenicity within normal limits. No discrete liver lesion identified. Portal vein is patent on color Doppler imaging with normal direction of blood flow towards the liver. IVC: No abnormality visualized. Pancreas: Not well visualized. Spleen: Not well visualized. Right Kidney: Length: 10.1 centimeters. No hydronephrosis. Renal echogenicity within normal limits. Shadowing lower pole calculus estimated at 7-8 millimeters (image 82). Left Kidney: Length: 10.4 centimeters. Left renal echogenicity within normal limits. 1 large versus multiple shadowing calculi near the renal hilum (image 95). No left hydronephrosis. A lower pole calculus estimated at 9 millimeters is identified on image 103. Abdominal aorta: Negative for aneurysm. Positive for irregularity compatible with atherosclerosis. Other findings: Fluid distended stomach.  No free fluid. IMPRESSION: 1. Positive for cholelithiasis, borderline to mild gallbladder wall thickening, borderline to mildly dilated CBD, and trace pericholecystic fluid. No sonographic [name] sign was elicited, and there is no intrahepatic biliary dilatation to strongly suggest acute duct obstruction. However, consider the possibility of choledocholithiasis. MRCP versus Nuclear Medicine Hepatobiliary Scan could be valuable in this clinical setting. 2. Nephrolithiasis. No other acute ultrasound finding in the abdomen. Electronically Signed   By: Odessa Fleming M.D.   On: 02/19/2019 01:03   Dg Chest Port 1 View  Result Date: 02/19/2019 CLINICAL DATA:  Cough EXAM: PORTABLE CHEST 1 VIEW COMPARISON:  12/02/2018 FINDINGS: Heart and mediastinal contours are within normal limits. No focal opacities or effusions. No acute bony abnormality. IMPRESSION: No active disease. Electronically Signed   By: Charlett Nose M.D.   On: 02/19/2019 17:42    Time  Spent in minutes  30   Susa Raring M.D on 02/22/2019 at 11:42 AM  To page go to [email or web address] - password [facility]

## 2019-02-22 NOTE — Progress Notes (Signed)
CRITICAL VALUE ALERT  Critical Value:  K =2.5  Date & Time Notied:02/22/19  0655  Provider Notified: Lovey Newcomer NP  Orders Received/Actions taken: Waiting for replacement orders

## 2019-02-22 NOTE — Evaluation (Signed)
Clinical/Bedside Swallow Evaluation Patient Details  Name: Rodney Barber MRN: [id number] Date of Birth: 01-22-1966  Today's Date: 02/22/2019 Time: SLP Start Time (ACUTE ONLY): 1330 SLP Stop Time (ACUTE ONLY): 1345 SLP Time Calculation (min) (ACUTE ONLY): 15 min  Past Medical History:  Past Medical History:  Diagnosis Date  . Diabetes mellitus   . Hypertension   . Pancreatitis    Past Surgical History:  Past Surgical History:  Procedure Laterality Date  . ERCP  06/27/2011   Procedure: ENDOSCOPIC RETROGRADE CHOLANGIOPANCREATOGRAPHY (ERCP);  Surgeon: Petra Kuba, MD;  Location: [facility];  Service: Endoscopy;  Laterality: N/A;  . ERCP W/ PLASTIC STENT PLACEMENT  03/2011   HPI:  Patient is a 53 y.o. 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  He was going to be transferred to [place] liver unit but his liver test improved with treatment at [facility].   Assessment / Plan / Recommendation Clinical Impression  Patient presents with what appears to be a primary cognitive-based dysphagia, with very poor alertness or awareness to bolus. SLP performed oral care, however patient kept teeth clenched and when toothette sponge of water introduced, he held water in anterior portion of mouth without attempt to swallow. When speaking, his voice sounded wet, but he did not allow for adequate oral care via suctioning. At this time he is not safe for any PO's but if/when his alertness improves, he likely would be able to start on a PO diet. SLP Visit Diagnosis: Dysphagia, unspecified (R13.10)    Aspiration Risk  Moderate aspiration risk;Severe aspiration risk    Diet Recommendation NPO   Medication Administration: Via alternative means    Other  Recommendations Oral Care Recommendations: Oral care QID   Follow up Recommendations  Skilled Nursing facility      Frequency and Duration min 2x/week  1 week       Prognosis Prognosis for Safe Diet Advancement: Fair Barriers to Reach Goals: Cognitive deficits;Severity of deficits      Swallow Study   General Date of Onset: 02/18/19 HPI: Patient is a 53 y.o. 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  He was going to be transferred to [facility] liver unit but his liver test improved with treatment at [facility]. Type of Study: Bedside Swallow Evaluation Previous Swallow Assessment: N/A Diet Prior to this Study: NPO Temperature Spikes Noted: No History of Recent Intubation: No Behavior/Cognition: Lethargic/Drowsy;Distractible;Requires cueing Oral Cavity Assessment: Other (comment)(patient would not allow for full OME) Oral Care Completed by SLP: Yes Oral Cavity - Dentition: Poor condition Patient Positioning: Upright in bed Baseline Vocal Quality: Wet Volitional Cough: Cognitively unable to elicit Volitional Swallow: Unable to elicit    Oral/Motor/Sensory Function Overall Oral Motor/Sensory Function: Within functional limits   Ice Chips Ice chips: Not tested   Thin Liquid Thin Liquid: Impaired Presentation: (toothette sponge) Oral Phase Impairments: Poor awareness of bolus Oral Phase Functional Implications: Oral holding;Oral residue Other Comments: patient clenching teeth and holding small amount of water from toothette sponge in anterior portion of mouth but no swallow initiated    Nectar Thick Nectar Thick Liquid: Not tested   Honey Thick Honey Thick Liquid: Not tested   Puree Puree: Not tested   Solid  Solid: Not tested      Rodney Barber 02/22/2019,3:19 PM   Sonia Baller, MA, CCC-SLP Speech Therapy [facility] Acute Rehab Pager: [phone]

## 2019-02-22 NOTE — TOC Initial Note (Signed)
Transition of Care [facility]) - Initial/Assessment Note    Patient Details  Name: Rodney Barber MRN: [id number] Date of Birth: 11/06/1965  [facility] CM/SW Contact:    Mearl Latin, LCSW Phone Number: 02/22/2019, 9:43 AM  Clinical Narrative:                 CSW following for transition of care needs. Patient is from home alone with high readmission risk, including substance use and loss of family members. Received a walker and charity home health during last hospital admission. Patient's brother is best point of contact.     Barriers to Discharge: Continued Medical Work up   Patient Goals and CMS Choice     Choice offered to / list presented to : NA  Expected Discharge Plan and Services   In-house Referral: Clinical Social Work, Hospice / Palliative Care Discharge Planning Services: NA Post Acute Care Choice: NA Living arrangements for the past 2 months: Single Family Home                                      Prior Living Arrangements/Services Living arrangements for the past 2 months: Single Family Home Lives with:: Self Patient language and need for interpreter reviewed:: Yes Do you feel safe going back to the place where you live?: No   Lives alone  Need for Family Participation in Patient Care: Yes (Comment) Care giver support system in place?: Yes (comment) Current home services: DME Criminal Activity/Legal Involvement Pertinent to Current Situation/Hospitalization: No - Comment as needed  Activities of Daily Living Home Assistive Devices/Equipment: CBG Meter ADL Screening (condition at time of admission) Patient's cognitive ability adequate to safely complete daily activities?: Yes Is the patient deaf or have difficulty hearing?: No Does the patient have difficulty seeing, even when wearing glasses/contacts?: No Does the patient have difficulty concentrating, remembering, or making decisions?: Yes Patient able to express need for assistance  with ADLs?: Yes Does the patient have difficulty dressing or bathing?: No Independently performs ADLs?: Yes (appropriate for developmental age) Does the patient have difficulty walking or climbing stairs?: No Weakness of Legs: None Weakness of Arms/Hands: None  Permission Sought/Granted Permission sought to share information with : Family Supports    Share Information with NAME: Trey Paula     Permission granted to share info w Relationship: Brother  Permission granted to share info w Contact Information: [phone]  Emotional Assessment Appearance:: Appears stated age Attitude/Demeanor/Rapport: Unable to Assess Affect (typically observed): Unable to Assess Orientation: : (Disoriented x4) Alcohol / Substance Use: Illicit Drugs Psych Involvement: No (comment)  Admission diagnosis:  AKI (acute kidney injury) (HCC) [N17.9] Diabetic ketoacidosis without coma associated with type 1 diabetes mellitus (HCC) [E10.10] Patient Active Problem List   Diagnosis Date Noted  . Goals of care, counseling/discussion   . Palliative care by specialist   . Shock liver 02/18/2019  . Hyperosmolar non-ketotic state in patient with type 2 diabetes mellitus (HCC) 02/18/2019  . Alcohol use 02/18/2019  . Substance use disorder 02/18/2019  . AKI (acute kidney injury) (HCC)   . Altered behavior   . Altered mental status 12/02/2018  . Abnormal liver function 12/02/2018  . AMS (altered mental status) 12/02/2018  . Cough 12/02/2018  . Uncontrolled type 2 diabetes mellitus (HCC) 03/15/2018  . Urine test positive for microalbuminuria 07/12/2017  . DM (diabetes mellitus) type 2, uncontrolled, with ketoacidosis (HCC) 09/08/2013  .  Liver lesion 07/22/2013  . Alcohol-induced chronic pancreatitis ([place]) 07/22/2013  . Diabetes ([employer]) 07/18/2013  . Hypokalemia 07/11/2013  . Protein-calorie malnutrition, severe ([place]) 07/11/2013  . Chronic pancreatitis ([place]) 07/10/2013  . 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 Diabetes mellitus ([place]) 07/10/2013  . Weight loss 07/10/2013  . Diarrhea 07/10/2013  . Liver lesion, right lobe 07/10/2013   PCP:  Gildardo Pounds, NP Pharmacy:   [facility] [address] [place] [address] Phone: [phone] Fax: [phone]     Social Determinants of Health (SDOH) Interventions    Readmission Risk Interventions Readmission Risk Prevention Plan 02/22/2019  Transportation Screening Complete  Medication Review Press photographer) Complete  PCP or Specialist appointment within 3-5 days of discharge Complete  HRI or [place] Complete  SW Recovery Care/Counseling Consult Complete  Palliative Care Screening Complete  [place] Not Applicable  Some recent data might be hidden

## 2019-02-22 NOTE — Consult Note (Signed)
[facility]    NEPHROLOGY CONSULTATION NOTE  PATIENT ID:  Rodney Barber, DOB:  Jan 06, 1966  HPI: The patient is a 53 y.o. 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  He presented with DKA and market elevation of his transaminases and possible acetaminophen toxicity.  Consideration was made for transfer to [facility] liver unit however his liver function is appear to be improving.  He underwent treatment with acetylcysteine.  His baseline serum creatinine runs around 1.  On admission, he was noted to have a serum creatinine of 2.4.  His serum creatinines have ranged between 1.45 and 2.44 since his admission.  He has been lethargic and unable to provide any other associated history.  He has had at least 1 L of urine output daily for the past several days.  His potassium has also been persistently low.   Past Medical History:  Diagnosis Date  . Diabetes mellitus   . Hypertension   . Pancreatitis     Past Surgical History:  Procedure Laterality Date  . ERCP  06/27/2011   Procedure: ENDOSCOPIC RETROGRADE CHOLANGIOPANCREATOGRAPHY (ERCP);  Surgeon: Jeryl Columbia, MD;  Location: [facility];  Service: Endoscopy;  Laterality: N/A;  . ERCP W/ PLASTIC STENT PLACEMENT  03/2011    Family History  Problem Relation Age of Onset  . Diabetes Mother   . Diabetes Brother     Social History   Tobacco Use  . Smoking status: Current Every Day Smoker    Packs/day: 0.50    Years: 30.00    Pack years: 15.00    Types: Cigarettes  . Smokeless tobacco: Never Used  . Tobacco comment: 6 cigarett /day  Substance Use Topics  . Alcohol use: No  . Drug use: Yes    Types: "Crack" cocaine, Other-see comments, Cocaine    Comment: last smoked crack 12-16-16    REVIEW OF SYSTEMS: Unable to obtain secondary to  lethargy   PHYSICAL EXAM:  Vitals:   02/22/19 1156 02/22/19 1311  BP: (!) 150/83 (!) 170/98  Pulse: (!) 103 (!) 106  Resp:    Temp: 99.4 F (37.4 C) 98 F (36.7 C)  SpO2: 95% 91%   I/O last 3 completed shifts: In: 4219.6 [I.V.:3719.6; IV Piggyback:500] Out: 3000 [Urine:3000]   General: Lethargic NAD, snoring HEENT: MMM [place] AT positive icterus Neck:  No JVD, no adenopathy CV:  Heart RRR  Lungs:  L/S with scattered rhonchi Abd:  abd SNT/ND with normal BS GU:  Bladder non-palpable Extremities:  No LE edema. Skin:  No skin rash Psych:  normal mood and affect Neuro:  no focal deficits   CURRENT MEDICATIONS:  . chlorhexidine  15 mL Mouth Rinse BID  . cloNIDine  0.1 mg Transdermal Weekly  . folic acid  1 mg Oral Daily  . insulin aspart  0-9 Units Subcutaneous TID WC  . lactulose  300 mL Rectal BID  . mouth rinse  15 mL Mouth Rinse q12n4p  . multivitamin with minerals  1 tablet Oral Daily  . nicotine  14 mg Transdermal Daily  . thiamine  100 mg Oral Daily     HOME MEDICATIONS:  Prior to Admission medications   Medication Sig Start Date End Date Taking? Authorizing Provider  Blood Glucose Monitoring Suppl (TRUERESULT BLOOD GLUCOSE) w/Device KIT uad 04/23/17  Yes Hairston, Mandesia R, FNP  escitalopram (LEXAPRO) 10 MG  tablet Take 0.5 tablets (5 mg total) by mouth daily for 30 days. 12/17/18 02/18/19 Yes Kerin Perna, NP  glipiZIDE (GLUCOTROL) 5 MG tablet Take 1 tablet (5 mg total) by mouth 2 (two) times daily before a meal. 12/17/18  Yes Kerin Perna, NP  glucose blood (TRUE METRIX BLOOD GLUCOSE TEST) test strip Use as instructed 12/17/18  Yes Kerin Perna, NP  insulin detemir (LEVEMIR) 100 UNIT/ML injection Inject 0.25 mLs (25 Units total) into the skin at bedtime. 12/17/18  Yes Kerin Perna, NP  lipase/protease/amylase (CREON) 12000 units CPEP capsule Take 1 capsule (12,000 Units total) by mouth 3 (three) times daily with meals. 12/17/18  Yes Kerin Perna, NP  lisinopril (ZESTRIL) 5 MG tablet Take 1 tablet (5 mg total) by mouth daily. 12/17/18  Yes Kerin Perna, NP  TRUEPLUS LANCETS 28G MISC uad 04/23/17  Yes Alfonse Spruce, FNP       LABS:  CBC Latest Ref Rng & Units 02/22/2019 02/21/2019 02/20/2019  WBC 4.0 - 10.5 K/uL 5.0 4.6 5.8  Hemoglobin 13.0 - 17.0 g/dL 10.1(L) 10.9(L) 11.7(L)  Hematocrit 39.0 - 52.0 % 30.0(L) 31.8(L) 35.1(L)  Platelets 150 - 400 K/uL 91(L) 98(L) 110(L)    CMP Latest Ref Rng & Units 02/22/2019 02/21/2019 02/21/2019  Glucose 70 - 99 mg/dL 104(H) 66(L) 143(H)  BUN 6 - 20 mg/dL 32(H) 34(H) 34(H)  Creatinine 0.61 - 1.24 mg/dL 2.24(H) 2.35(H) 2.38(H)  Sodium 135 - 145 mmol/L 148(H) 148(H) 146(H)  Potassium 3.5 - 5.1 mmol/L 2.5(LL) 3.5 2.6(LL)  Chloride 98 - 111 mmol/L 118(H) 115(H) 113(H)  CO2 22 - 32 mmol/L 21(L) 23 23  Calcium 8.9 - 10.3 mg/dL 7.7(L) 7.7(L) 7.8(L)  Total Protein 6.5 - 8.1 g/dL 4.5(L) 4.5(L) 4.6(L)  Total Bilirubin 0.3 - 1.2 mg/dL 5.2(H) 5.0(H) 4.5(H)  Alkaline Phos 38 - 126 U/L 250(H) 216(H) 193(H)  AST 15 - 41 U/L 421(H) 855(H) 1,879(H)  ALT 0 - 44 U/L 1,244(H) 1,706(H) 2,254(H)    Lab Results  Component Value Date   CALCIUM 7.7 (L) 02/22/2019   CAION 0.97 (L) 02/18/2019   PHOS 2.5 12/08/2010       Component Value Date/Time   COLORURINE AMBER (A) 02/18/2019 2301   APPEARANCEUR HAZY (A) 02/18/2019 2301   LABSPEC 1.024 02/18/2019 2301   PHURINE 6.0 02/18/2019 2301   GLUCOSEU >=500 (A) 02/18/2019 2301   HGBUR LARGE (A) 02/18/2019 2301   BILIRUBINUR NEGATIVE 02/18/2019 2301   BILIRUBINUR negative 03/15/2018 1049   BILIRUBINUR negative 12/26/2017 0941   KETONESUR 5 (A) 02/18/2019 2301   PROTEINUR 100 (A) 02/18/2019 2301   UROBILINOGEN 0.2 03/15/2018 1049   UROBILINOGEN 0.2 12/08/2010 1613   NITRITE NEGATIVE 02/18/2019 2301   LEUKOCYTESUR NEGATIVE 02/18/2019 2301      Component Value Date/Time   HCO3 23.6 02/18/2019 1959   TCO2 24 02/18/2019 1959   O2SAT 96.0  02/18/2019 1959       Component Value Date/Time   FERRITIN 269 12/03/2018 0348       ASSESSMENT/PLAN:    1.  Baseline serum creatinine around 1.  2.  Acute kidney injury.  Suspect multifactorial, possibly related to some hepatorenal physiology versus bilirubin toxicity versus volume depletion.  Agree with IV fluids as patient is not eating.  Check a urine protein to creatinine ratio and renal ultrasound.  Urine output appears to be excellent, arguing against hepatorenal syndrome and volume depletion.  Also had some component of bladder outlet obstruction.  Also has  a history of cocaine and alcohol use, which can certainly cause renal injury.  3.  Acute liver failure.  Improving.  Status post N-acetylcysteine.  4.  Hypokalemia.  Persistent.  Suspect related to shifts and ammonia level.  May also have some underlying renal losses due to alcohol use.  Will check urine electrolytes to better evaluate.  5.  Hepatic encephalopathy.  Continue to monitor mental status closely.  6.  DKA.  Improved.  7.  Hypertension.  Will increase clonidine patch to 1.2.   [place], DO, [place]

## 2019-02-22 NOTE — Progress Notes (Signed)
Patient maintaining sats > 90% on 2L via [place].  Pt occasionally  tachypneic  to high 30s otherwise VSS. Pt with congestion requiring frequent oral suction this shift. To continue to  monitor and treat pt per MD and Nursing orders

## 2019-02-22 NOTE — Consult Note (Signed)
[facility] CONSULT Reason for consult: Abnormal liver test Referring Physician: [facility] hospitalist  Rodney Barber is an 53 y.o. male.  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 Watt Climes and subsequently removed.  And came into the hospital this time with DKA exceedingly high liver tests with transaminases over 10,000 and possible acetaminophen toxicity from his family failure.  Patient was due to be transferred to [facility] liver unit however his liver test is improved with treatment here, [facility] suggested that he remain here in [place].  He has received appropriate acetylcysteine.  His INR was 3.0 but is now come down to 1.7 and transaminases is 9.29.  He still remains obtunded.Ammonia level remains elevated but is improved.  Acetaminophen level is 135 on the patient on admission.  Hepatitis B, C and HIV antibodies were all negative.  Patient was negative for coronavirus  Past Medical History:  Diagnosis Date  . Diabetes mellitus   . Hypertension   . Pancreatitis     Past Surgical History:  Procedure Laterality Date  . ERCP  06/27/2011   Procedure: ENDOSCOPIC RETROGRADE CHOLANGIOPANCREATOGRAPHY (ERCP);  Surgeon: Jeryl Columbia, MD;  Location: [facility];  Service: Endoscopy;  Laterality: N/A;  . ERCP W/ PLASTIC STENT PLACEMENT  03/2011    Family History  Problem Relation Age of Onset  . Diabetes Mother   . Diabetes Brother     Social History:  reports that he has been smoking cigarettes. He has a 15.00 pack-year smoking history. He has never used smokeless tobacco. He reports current drug use. Drugs: "Crack" cocaine, Other-see comments, and Cocaine. He reports that he does not drink alcohol.  Allergies: No Known Allergies  Medications; Prior to Admission  medications   Medication Sig Start Date End Date Taking? Authorizing Provider  Blood Glucose Monitoring Suppl (TRUERESULT BLOOD GLUCOSE) w/Device KIT uad 04/23/17  Yes Hairston, Mandesia R, FNP  escitalopram (LEXAPRO) 10 MG tablet Take 0.5 tablets (5 mg total) by mouth daily for 30 days. 12/17/18 02/18/19 Yes Kerin Perna, NP  glipiZIDE (GLUCOTROL) 5 MG tablet Take 1 tablet (5 mg total) by mouth 2 (two) times daily before a meal. 12/17/18  Yes Kerin Perna, NP  glucose blood (TRUE METRIX BLOOD GLUCOSE TEST) test strip Use as instructed 12/17/18  Yes Kerin Perna, NP  insulin detemir (LEVEMIR) 100 UNIT/ML injection Inject 0.25 mLs (25 Units total) into the skin at bedtime. 12/17/18  Yes Kerin Perna, NP  lipase/protease/amylase (CREON) 12000 units CPEP capsule Take 1 capsule (12,000 Units total) by mouth 3 (three) times daily with meals. 12/17/18  Yes Kerin Perna, NP  lisinopril (ZESTRIL) 5 MG tablet Take 1 tablet (5 mg total) by mouth daily. 12/17/18  Yes Kerin Perna, NP  TRUEPLUS LANCETS 28G MISC uad 04/23/17  Yes Alfonse Spruce, FNP   . chlorhexidine  15 mL Mouth Rinse BID  . cloNIDine  0.1 mg Transdermal Weekly  . folic acid  1 mg Oral Daily  . insulin aspart  0-9 Units Subcutaneous TID WC  . lactulose  300 mL Rectal BID  . mouth rinse  15 mL Mouth Rinse q12n4p  . multivitamin with minerals  1 tablet Oral Daily  . nicotine  14 mg Transdermal Daily  . thiamine  100 mg  Oral Daily   PRN Meds hydrALAZINE, promethazine Results for orders placed or performed during the hospital encounter of 02/18/19 (from the past 48 hour(s))  Glucose, capillary     Status: Abnormal   Collection Time: 02/20/19  3:56 PM  Result Value Ref Range   Glucose-Capillary 109 (H) 70 - 99 mg/dL  Acetaminophen level     Status: None   Collection Time: 02/20/19  4:58 PM  Result Value Ref Range   Acetaminophen (Tylenol), Serum 11 10 - 30 ug/mL    Comment: (NOTE) Therapeutic  concentrations vary significantly. A range of 10-30 ug/mL  may be an effective concentration for many patients. However, some  are best treated at concentrations outside of this range. Acetaminophen concentrations >150 ug/mL at 4 hours after ingestion  and >50 ug/mL at 12 hours after ingestion are often associated with  toxic reactions. Performed at [facility], [place] [address] [place], [place] [address]   Comprehensive metabolic panel     Status: Abnormal   Collection Time: 02/20/19  4:58 PM  Result Value Ref Range   Sodium 147 (H) 135 - 145 mmol/L   Potassium 3.3 (L) 3.5 - 5.1 mmol/L   Chloride 112 (H) 98 - 111 mmol/L   CO2 22 22 - 32 mmol/L   Glucose, Bld 134 (H) 70 - 99 mg/dL   BUN 34 (H) 6 - 20 mg/dL   Creatinine, Ser 2.42 (H) 0.61 - 1.24 mg/dL   Calcium 7.9 (L) 8.9 - 10.3 mg/dL   Total Protein 4.7 (L) 6.5 - 8.1 g/dL   Albumin 2.6 (L) 3.5 - 5.0 g/dL   AST 4,848 (H) 15 - 41 U/L    Comment: RESULTS CONFIRMED BY MANUAL DILUTION   ALT 3,045 (H) 0 - 44 U/L    Comment: RESULTS CONFIRMED BY MANUAL DILUTION   Alkaline Phosphatase 166 (H) 38 - 126 U/L   Total Bilirubin 3.4 (H) 0.3 - 1.2 mg/dL   GFR calc non Af Amer 29 (L) >60 mL/min   GFR calc Af Amer 34 (L) >60 mL/min   Anion gap 13 5 - 15    Comment: Performed at [facility], [place] [address], [place], [place] [address]  Protime-INR     Status: Abnormal   Collection Time: 02/20/19  4:58 PM  Result Value Ref Range   Prothrombin Time 24.4 (H) 11.4 - 15.2 seconds   INR 2.2 (H) 0.8 - 1.2    Comment: (NOTE) INR goal varies based on device and disease states. Performed at [facility], [place] [address] [place], [place] [address]   Glucose, capillary     Status: Abnormal   Collection Time: 02/20/19  8:46 PM  Result Value Ref Range   Glucose-Capillary 117 (H) 70 - 99 mg/dL  Glucose, capillary     Status: Abnormal   Collection Time: 02/21/19 12:12 AM  Result Value Ref Range   Glucose-Capillary 119 (H) 70 - 99  mg/dL  CBC     Status: Abnormal   Collection Time: 02/21/19  5:11 AM  Result Value Ref Range   WBC 4.6 4.0 - 10.5 K/uL   RBC 3.65 (L) 4.22 - 5.81 MIL/uL   Hemoglobin 10.9 (L) 13.0 - 17.0 g/dL   HCT 31.8 (L) 39.0 - 52.0 %   MCV 87.1 80.0 - 100.0 fL   MCH 29.9 26.0 - 34.0 pg   MCHC 34.3 30.0 - 36.0 g/dL   RDW 13.2 11.5 - 15.5 %   Platelets  98 (L) 150 - 400 K/uL    Comment: REPEATED TO VERIFY Immature Platelet Fraction may be clinically indicated, consider ordering this additional test [id number] CONSISTENT WITH PREVIOUS RESULT    nRBC 0.9 (H) 0.0 - 0.2 %    Comment: Performed at [facility], [place] [address], [place], [place] [address]  Acetaminophen level     Status: Abnormal   Collection Time: 02/21/19  5:11 AM  Result Value Ref Range   Acetaminophen (Tylenol), Serum <10 (L) 10 - 30 ug/mL    Comment: (NOTE) Therapeutic concentrations vary significantly. A range of 10-30 ug/mL  may be an effective concentration for many patients. However, some  are best treated at concentrations outside of this range. Acetaminophen concentrations >150 ug/mL at 4 hours after ingestion  and >50 ug/mL at 12 hours after ingestion are often associated with  toxic reactions. Performed at [facility], [place] [address], [place], [place] [address]   Comprehensive metabolic panel     Status: Abnormal   Collection Time: 02/21/19  5:11 AM  Result Value Ref Range   Sodium 146 (H) 135 - 145 mmol/L   Potassium 2.6 (LL) 3.5 - 5.1 mmol/L    Comment: CRITICAL RESULT CALLED TO, READ BACK BY AND VERIFIED WITH: NGENO [facility] 02/21/19 0608 WAYK    Chloride 113 (H) 98 - 111 mmol/L   CO2 23 22 - 32 mmol/L   Glucose, Bld 143 (H) 70 - 99 mg/dL   BUN 34 (H) 6 - 20 mg/dL   Creatinine, Ser 2.38 (H) 0.61 - 1.24 mg/dL   Calcium 7.8 (L) 8.9 - 10.3 mg/dL   Total Protein 4.6 (L) 6.5 - 8.1 g/dL   Albumin 2.3 (L) 3.5 - 5.0 g/dL   AST 1,879 (H) 15 - 41 U/L   ALT 2,254 (H) 0 - 44 U/L   Alkaline Phosphatase 193  (H) 38 - 126 U/L   Total Bilirubin 4.5 (H) 0.3 - 1.2 mg/dL   GFR calc non Af Amer 30 (L) >60 mL/min   GFR calc Af Amer 35 (L) >60 mL/min   Anion gap 10 5 - 15    Comment: Performed at [facility], [place] [address], [place], [place] [address]  Protime-INR     Status: Abnormal   Collection Time: 02/21/19  5:11 AM  Result Value Ref Range   Prothrombin Time 21.4 (H) 11.4 - 15.2 seconds   INR 1.9 (H) 0.8 - 1.2    Comment: (NOTE) INR goal varies based on device and disease states. Performed at [facility], [place] [address], [place], [place] [address]   Lactic acid, plasma     Status: None   Collection Time: 02/21/19  5:11 AM  Result Value Ref Range   Lactic Acid, Venous 1.4 0.5 - 1.9 mmol/L    Comment: Performed at [facility] [address], [place], [place] [address]  Magnesium     Status: None   Collection Time: 02/21/19  5:11 AM  Result Value Ref Range   Magnesium 2.0 1.7 - 2.4 mg/dL    Comment: Performed at [facility] [address] [place], [place] [address]  Glucose, capillary     Status: Abnormal   Collection Time: 02/21/19  5:32 AM  Result Value Ref Range   Glucose-Capillary 137 (H) 70 - 99 mg/dL  Glucose, capillary     Status: Abnormal   Collection Time: 02/21/19  8:06 AM  Result Value Ref Range  Glucose-Capillary 142 (H) 70 - 99 mg/dL  Glucose, capillary     Status: Abnormal   Collection Time: 02/21/19 11:45 AM  Result Value Ref Range   Glucose-Capillary 131 (H) 70 - 99 mg/dL  Glucose, capillary     Status: Abnormal   Collection Time: 02/21/19  4:51 PM  Result Value Ref Range   Glucose-Capillary 59 (L) 70 - 99 mg/dL  Comprehensive metabolic panel     Status: Abnormal   Collection Time: 02/21/19  4:53 PM  Result Value Ref Range   Sodium 148 (H) 135 - 145 mmol/L   Potassium 3.5 3.5 - 5.1 mmol/L    Comment: DELTA CHECK NOTED   Chloride 115 (H) 98 - 111 mmol/L   CO2 23 22 - 32 mmol/L   Glucose, Bld 66 (L) 70 - 99 mg/dL   BUN 34 (H) 6 -  20 mg/dL   Creatinine, Ser 2.35 (H) 0.61 - 1.24 mg/dL   Calcium 7.7 (L) 8.9 - 10.3 mg/dL   Total Protein 4.5 (L) 6.5 - 8.1 g/dL   Albumin 2.4 (L) 3.5 - 5.0 g/dL   AST 855 (H) 15 - 41 U/L   ALT 1,706 (H) 0 - 44 U/L   Alkaline Phosphatase 216 (H) 38 - 126 U/L   Total Bilirubin 5.0 (H) 0.3 - 1.2 mg/dL   GFR calc non Af Amer 30 (L) >60 mL/min   GFR calc Af Amer 35 (L) >60 mL/min   Anion gap 10 5 - 15    Comment: Performed at [facility], [place] [address], [place], [place] [address]  Protime-INR     Status: Abnormal   Collection Time: 02/21/19  4:53 PM  Result Value Ref Range   Prothrombin Time 18.9 (H) 11.4 - 15.2 seconds   INR 1.6 (H) 0.8 - 1.2    Comment: (NOTE) INR goal varies based on device and disease states. Performed at [facility], [place] [address] [place], [place] [address]   Glucose, capillary     Status: None   Collection Time: 02/21/19  6:12 PM  Result Value Ref Range   Glucose-Capillary 97 70 - 99 mg/dL  Glucose, capillary     Status: None   Collection Time: 02/21/19  7:37 PM  Result Value Ref Range   Glucose-Capillary 79 70 - 99 mg/dL  Glucose, capillary     Status: Abnormal   Collection Time: 02/21/19 11:10 PM  Result Value Ref Range   Glucose-Capillary 117 (H) 70 - 99 mg/dL  Glucose, capillary     Status: Abnormal   Collection Time: 02/22/19  3:31 AM  Result Value Ref Range   Glucose-Capillary 107 (H) 70 - 99 mg/dL  CBC     Status: Abnormal   Collection Time: 02/22/19  4:33 AM  Result Value Ref Range   WBC 5.0 4.0 - 10.5 K/uL   RBC 3.38 (L) 4.22 - 5.81 MIL/uL   Hemoglobin 10.1 (L) 13.0 - 17.0 g/dL   HCT 30.0 (L) 39.0 - 52.0 %   MCV 88.8 80.0 - 100.0 fL   MCH 29.9 26.0 - 34.0 pg   MCHC 33.7 30.0 - 36.0 g/dL   RDW 13.5 11.5 - 15.5 %   Platelets 91 (L) 150 - 400 K/uL    Comment: REPEATED TO VERIFY Immature Platelet Fraction may be clinically indicated, consider ordering this additional test [id number] CONSISTENT WITH PREVIOUS RESULT    nRBC  0.8 (H) 0.0 - 0.2 %    Comment: Performed at  [facility], [place] [address] [place], [place] [address]  Comprehensive metabolic panel     Status: Abnormal   Collection Time: 02/22/19  4:33 AM  Result Value Ref Range   Sodium 148 (H) 135 - 145 mmol/L   Potassium 2.5 (LL) 3.5 - 5.1 mmol/L    Comment: CRITICAL RESULT CALLED TO, READ BACK BY AND VERIFIED WITH: NGENO [facility] 02/22/19 0601 WAYK    Chloride 118 (H) 98 - 111 mmol/L   CO2 21 (L) 22 - 32 mmol/L   Glucose, Bld 104 (H) 70 - 99 mg/dL   BUN 32 (H) 6 - 20 mg/dL   Creatinine, Ser 2.24 (H) 0.61 - 1.24 mg/dL   Calcium 7.7 (L) 8.9 - 10.3 mg/dL   Total Protein 4.5 (L) 6.5 - 8.1 g/dL   Albumin 2.0 (L) 3.5 - 5.0 g/dL   AST 421 (H) 15 - 41 U/L   ALT 1,244 (H) 0 - 44 U/L   Alkaline Phosphatase 250 (H) 38 - 126 U/L   Total Bilirubin 5.2 (H) 0.3 - 1.2 mg/dL   GFR calc non Af Amer 32 (L) >60 mL/min   GFR calc Af Amer 37 (L) >60 mL/min   Anion gap 9 5 - 15    Comment: Performed at [facility], [place] [address] [place], [place] [address]  Protime-INR     Status: Abnormal   Collection Time: 02/22/19  4:33 AM  Result Value Ref Range   Prothrombin Time 19.5 (H) 11.4 - 15.2 seconds   INR 1.7 (H) 0.8 - 1.2    Comment: (NOTE) INR goal varies based on device and disease states. Performed at [facility], [place] [address] [place], [place] [address]   Lactic acid, plasma     Status: None   Collection Time: 02/22/19  4:33 AM  Result Value Ref Range   Lactic Acid, Venous 1.5 0.5 - 1.9 mmol/L    Comment: Performed at [facility] [address], [place], [place] [address]  Ammonia     Status: Abnormal   Collection Time: 02/22/19  4:33 AM  Result Value Ref Range   Ammonia 43 (H) 9 - 35 umol/L    Comment: Performed at [facility], [place] [address], [place], [place] [address]  Magnesium     Status: None   Collection Time: 02/22/19  4:33 AM  Result Value Ref Range   Magnesium 1.7 1.7 - 2.4 mg/dL    Comment: Performed at  [facility] [address] [place], [place] [address]  Acetaminophen level     Status: Abnormal   Collection Time: 02/22/19  4:33 AM  Result Value Ref Range   Acetaminophen (Tylenol), Serum <10 (L) 10 - 30 ug/mL    Comment: (NOTE) Therapeutic concentrations vary significantly. A range of 10-30 ug/mL  may be an effective concentration for many patients. However, some  are best treated at concentrations outside of this range. Acetaminophen concentrations >150 ug/mL at 4 hours after ingestion  and >50 ug/mL at 12 hours after ingestion are often associated with  toxic reactions. Performed at [facility], [place] [address] [place], [place] [address]   Glucose, capillary     Status: None   Collection Time: 02/22/19  8:13 AM  Result Value Ref Range   Glucose-Capillary 83 70 - 99 mg/dL  Glucose, capillary     Status: None   Collection Time: 02/22/19 11:55 AM  Result Value Ref Range  Glucose-Capillary 90 70 - 99 mg/dL    Ct Head Wo Contrast  Result Date: 02/21/2019 CLINICAL DATA:  Altered mental status EXAM: CT HEAD WITHOUT CONTRAST TECHNIQUE: Contiguous axial images were obtained from the base of the skull through the vertex without intravenous contrast. COMPARISON:  December 02, 2018 FINDINGS: Brain: Ventricles and sulci are within normal limits. There is no intracranial mass, hemorrhage, extra-axial fluid collection, or midline shift. Brain parenchyma appears unremarkable. No acute infarct evident. Vascular: There is no hyperdense vessel. There is calcification in each carotid siphon region. Skull: Bony calvarium appears intact. Sinuses/Orbits: There are foci of opacification in each maxillary antrum, more notable on the left than on the right. There is mucosal thickening in several ethmoid air cells. Orbits appear symmetric bilaterally. Other: There is a small air-fluid level in a posteroinferior mastoid air cell on the left. Mastoids elsewhere are clear. IMPRESSION: Brain  parenchyma appears unremarkable.  No mass or hemorrhage. There are foci of arterial vascular calcification. There are areas of paranasal sinus disease. A small air-fluid level is noted in inferior posterior left mastoid air cell. Other mastoids are clear. Electronically Signed   By: Lowella Grip III M.D.   On: 02/21/2019 11:03               Blood pressure (!) 170/98, pulse (!) 106, temperature 98 F (36.7 C), temperature source Oral, resp. rate (!) 40, height 5' 8" (1.727 m), weight 49.3 kg, SpO2 91 %.  Physical exam:   General--resting comfortably in bed nonresponsive ENT--nonicteric Neck-- Heart--regular rate and rhythm without murmurs or gallops Lungs--clear Abdomen--soft and nontender no ascites Psych--nonresponsive   Assessment: 1.  Hepatic toxicity.  This is due to acetaminophen toxicity and alcohol.  He has been treated appropriately and his LFTs appear to be improving with INR Down to 2.0, do not think he needs any further therapy in this regard. 2.  Brittle diabetes.  Presented with DKA  Plan: 1.  We will continue supportive care for now do not feel that he needs any other specific treatment at this time.  We will follow-up with a few more days.   Nancy Fetter 02/22/2019, 1:55 PM   This note was created using voice recognition software and minor errors may Have occurred unintentionally. Pager: [phone] If no answer or after hours call [phone]

## 2019-02-22 NOTE — Progress Notes (Signed)
Received a call from [place] [place] poison control, was told to d/c pt acetylcysteine, pt will not be going to [facility] for possible liver transplant due to improvement in his labs and mentation. MD notified.

## 2019-02-22 NOTE — Progress Notes (Signed)
  Palliative medicine progress note  Patient seen, chart reviewed.  Patient is slightly more alert today.  He will open his eyes briefly to voice and attempts to answer some questions but his speech is still quite difficult to understand.  He is very congested and coughing frequently.  I do observe him covering his mouth when he coughs as well as pulling the covers over his head.  Updated his brother, Rodney Barber via phone secondary to COVID-19 visitor restrictions.  Also staffed with unit as well as per unit director, physician has approved visitation for [name].  Patient's brothers Rodney Barber and Rodney Barber are in to visit today around 1 PM.  Messages left at the front desk to allow visitation  As per my conversation with Rodney Barber his brother on 02/21/2019, family confirms DNR and transfer to tertiary care center if there is something that they can offer in terms of helping Rodney improve.  They are also receptive to temporary artificial feeding via cor track   Patient Profile: 53 y.o. male  with past medical history of uncontrolled diabetes (recent hemoglobin A1c 17.7),, hypertension, chronic pancreatitis, biliary stenosis, alcohol use disorder in remission, polysubstance use disorder, admitted on 02/18/2019 with nausea and vomiting.  Patient was found to have markedly elevated liver enzymes: AST 5859, ALT 2091, acute kidney injury creatinine 2.44 as well as Tylenol toxicity, 135. Despite supportive interventions interventions as well as medications to address Tylenol toxicity, laboratory values are improving, but patient remains encephalopathic  Consult ordered for goals of care in the setting of hepatic failure, Tylenol toxicity, DKA, coagulopathy.  Transfer to tertiary care center with a hepatologist being considered  Plan DNR DNI Family approves temporary artificial feeding via core track if recommended Family agreeable to transfer to tertiary care center with a hepatologist for further  evaluation of liver failure Patient's brothers, Rodney Barber and Rodney Barber into visit today Palliative medicine to continue to monitor patient's clinical condition and pursue goals of care  Prognosis I am very concerned that patient's clinical coarse going froward.  His laboratory values are improving, his level of alertness is slightly improved, but his prognosis is still very guarded.  He would qualify for his hospice benefit, residential hospice as well, if family were to opt for comfort care.  At this point they are pursuing some further work-up and are remaining hopeful for improvement  Disposition Family open to transfer to tertiary care center such as [facility] if better comes available otherwise, undetermined  Thank you, Romona Curls, NP Total time: 25 minutes Rater than 50% of time was spent in counseling and coordination of care

## 2019-02-23 DIAGNOSIS — T391X1A Poisoning by 4-Aminophenol derivatives, accidental (unintentional), initial encounter: Secondary | ICD-10-CM

## 2019-02-23 DIAGNOSIS — T391X4D Poisoning by 4-Aminophenol derivatives, undetermined, subsequent encounter: Secondary | ICD-10-CM

## 2019-02-23 LAB — COMPREHENSIVE METABOLIC PANEL
ALT: 736 U/L — ABNORMAL HIGH (ref 0–44)
AST: 149 U/L — ABNORMAL HIGH (ref 15–41)
Albumin: 1.9 g/dL — ABNORMAL LOW (ref 3.5–5.0)
Alkaline Phosphatase: 263 U/L — ABNORMAL HIGH (ref 38–126)
Anion gap: 7 (ref 5–15)
BUN: 26 mg/dL — ABNORMAL HIGH (ref 6–20)
CO2: 22 mmol/L (ref 22–32)
Calcium: 7.4 mg/dL — ABNORMAL LOW (ref 8.9–10.3)
Chloride: 113 mmol/L — ABNORMAL HIGH (ref 98–111)
Creatinine, Ser: 1.99 mg/dL — ABNORMAL HIGH (ref 0.61–1.24)
GFR calc Af Amer: 43 mL/min — ABNORMAL LOW (ref >60)
GFR calc non Af Amer: 37 mL/min — ABNORMAL LOW (ref >60)
Glucose, Bld: 229 mg/dL — ABNORMAL HIGH (ref 70–99)
Potassium: 3.1 mmol/L — ABNORMAL LOW (ref 3.5–5.1)
Sodium: 142 mmol/L (ref 135–145)
Total Bilirubin: 5.1 mg/dL — ABNORMAL HIGH (ref 0.3–1.2)
Total Protein: 4.2 g/dL — ABNORMAL LOW (ref 6.5–8.1)

## 2019-02-23 LAB — GLUCOSE, CAPILLARY
Glucose-Capillary: 167 mg/dL — ABNORMAL HIGH (ref 70–99)
Glucose-Capillary: 181 mg/dL — ABNORMAL HIGH (ref 70–99)
Glucose-Capillary: 183 mg/dL — ABNORMAL HIGH (ref 70–99)
Glucose-Capillary: 184 mg/dL — ABNORMAL HIGH (ref 70–99)
Glucose-Capillary: 195 mg/dL — ABNORMAL HIGH (ref 70–99)
Glucose-Capillary: 249 mg/dL — ABNORMAL HIGH (ref 70–99)
Glucose-Capillary: 274 mg/dL — ABNORMAL HIGH (ref 70–99)
Glucose-Capillary: 47 mg/dL — ABNORMAL LOW (ref 70–99)

## 2019-02-23 LAB — MAGNESIUM: Magnesium: 1.7 mg/dL (ref 1.7–2.4)

## 2019-02-23 LAB — CBC
HCT: 28.5 % — ABNORMAL LOW (ref 39.0–52.0)
Hemoglobin: 9.6 g/dL — ABNORMAL LOW (ref 13.0–17.0)
MCH: 30.1 pg (ref 26.0–34.0)
MCHC: 33.7 g/dL (ref 30.0–36.0)
MCV: 89.3 fL (ref 80.0–100.0)
Platelets: 95 10*3/uL — ABNORMAL LOW (ref 150–400)
RBC: 3.19 MIL/uL — ABNORMAL LOW (ref 4.22–5.81)
RDW: 13.5 % (ref 11.5–15.5)
WBC: 6 10*3/uL (ref 4.0–10.5)
nRBC: 0 % (ref 0.0–0.2)

## 2019-02-23 LAB — PROTIME-INR
INR: 1.4 — ABNORMAL HIGH (ref 0.8–1.2)
Prothrombin Time: 16.9 seconds — ABNORMAL HIGH (ref 11.4–15.2)

## 2019-02-23 LAB — AMMONIA: Ammonia: 28 umol/L (ref 9–35)

## 2019-02-23 MED ORDER — POTASSIUM CHLORIDE 2 MEQ/ML IV SOLN
INTRAVENOUS | Status: DC
  Administered 2019-02-23 – 2019-02-24 (×2): via INTRAVENOUS
  Filled 2019-02-23 (×4): qty 1000

## 2019-02-23 MED ORDER — DEXTROSE 50 % IV SOLN
25.0000 g | INTRAVENOUS | Status: AC
  Administered 2019-02-23: 25 g via INTRAVENOUS

## 2019-02-23 MED ORDER — MAGNESIUM SULFATE IN D5W 1-5 GM/100ML-% IV SOLN
1.0000 g | Freq: Once | INTRAVENOUS | Status: AC
  Administered 2019-02-23: 1 g via INTRAVENOUS
  Filled 2019-02-23: qty 100

## 2019-02-23 MED ORDER — DEXTROSE 5 % IV SOLN
INTRAVENOUS | Status: DC
  Administered 2019-02-23: 08:00:00 via INTRAVENOUS

## 2019-02-23 MED ORDER — SODIUM CHLORIDE 0.9% FLUSH: 10.0000 mL | INTRAVENOUS | Status: DC | PRN

## 2019-02-23 MED ORDER — POTASSIUM CHLORIDE 10 MEQ/100ML IV SOLN
10.0000 meq | INTRAVENOUS | Status: AC
  Administered 2019-02-23 (×6): 10 meq via INTRAVENOUS
  Filled 2019-02-23 (×7): qty 100

## 2019-02-23 MED ORDER — SODIUM CHLORIDE 0.9% FLUSH: 10.0000 mL | Freq: Two times a day (BID) | INTRAVENOUS | Status: DC

## 2019-02-23 MED ORDER — DEXTROSE 50 % IV SOLN
INTRAVENOUS | Status: AC
  Filled 2019-02-23: qty 50

## 2019-02-23 MED ORDER — LACTATED RINGERS IV SOLN
INTRAVENOUS | Status: AC
  Administered 2019-02-23: 08:00:00 via INTRAVENOUS

## 2019-02-23 NOTE — Evaluation (Signed)
Physical Therapy Evaluation Patient Details Name: Rodney Barber MRN: [id number] DOB: 04-06-1966 Today's Date: 02/23/2019   History of Present Illness  Rodney Barber is a 53 y.o. male with medical history significant for uncontrolled insulin-dependent type 2 diabetes, hypertension, chronic pancreatitis with history of chronic biliary stenosis, tobacco use, alcohol use, and substance use disorder who presents to the ED for evaluation of persistent nausea and vomiting.  Clinical Impression  Pt admitted with above. RN reports palliative has been consulted due to liver condition. Pt extremely poor historian. Pt requiring minA for ambulation and modA with ADLs. Unsure of medical plan. Currently I recommend SNF upon d/c as pt very deconditioned with noted bilat hand edema and fine motor deficits. Acute PT to cont to follow and progress mobility as able.    Follow Up Recommendations SNF    Equipment Recommendations  (TBD)    Recommendations for Other Services       Precautions / Restrictions Precautions Precautions: Fall Precaution Comments: lethargic Restrictions Weight Bearing Restrictions: No      Mobility  Bed Mobility Overal bed mobility: Needs Assistance Bed Mobility: Supine to Sit     Supine to sit: Min assist     General bed mobility comments: max directional verbal cues, minA for trunk elevation and to maintain balance at EOB  Transfers Overall transfer level: Needs assistance Equipment used: 1 person hand held assist Transfers: Sit to/from Stand Sit to Stand: Min assist         General transfer comment: pt unsteady, powered up well, minA to stedy in standing  Ambulation/Gait Ambulation/Gait assistance: Min assist;+2 safety/equipment(RN assisted with lines) Gait Distance (Feet): 10 Feet(from one side of the bed to the other) Assistive device: 1 person hand held assist Gait Pattern/deviations: Decreased stride length;Step-to pattern;Narrow base of  support;Shuffle Gait velocity: slow   General Gait Details: pt unsteady requiring minA to prevent fall, pt with short shufflted steps and minimal foot clearance  Stairs            Wheelchair Mobility    Modified Rankin (Stroke Patients Only)       Balance Overall balance assessment: Needs assistance Sitting-balance support: Feet supported;No upper extremity supported Sitting balance-Leahy Scale: Poor Sitting balance - Comments: pt attempted to don socks however unable and kept falling back onto bed   Standing balance support: Single extremity supported Standing balance-Leahy Scale: Poor Standing balance comment: dependent on physical assist                             Pertinent Vitals/Pain      Home Living Family/patient expects to be discharged to:: Unsure                 Additional Comments: pt poor historian, states he lives with his parents who work, unable to state home set up or stay awake long enough to engage in conversation    Prior Function Level of Independence: Independent         Comments: states he was working, unsure of accuracy     Hand Dominance        Extremity/Trunk Assessment   Upper Extremity Assessment Upper Extremity Assessment: Generalized weakness(bilat hand edema noted)    Lower Extremity Assessment Lower Extremity Assessment: Generalized weakness    Cervical / Trunk Assessment Cervical / Trunk Assessment: Normal  Communication   Communication: Expressive difficulties(lethargic with slurred speech)  Cognition Arousal/Alertness: Lethargic Behavior During Therapy: Flat affect Overall Cognitive Status:  Impaired/Different from baseline(however unsure what baseline is like) Area of Impairment: Orientation;Attention;Memory;Following commands;Safety/judgement;Awareness;Problem solving                 Orientation Level: Disoriented to;Time;Situation(stated hospital) Current Attention Level:  Focused Memory: Decreased short-term memory Following Commands: Follows one step commands inconsistently;Follows one step commands with increased time Safety/Judgement: Decreased awareness of safety;Decreased awareness of deficits Awareness: Intellectual Problem Solving: Slow processing;Decreased initiation;Difficulty sequencing;Requires verbal cues;Requires tactile cues General Comments: pt required max v/c's to keep eyes open and stay on task      General Comments General comments (skin integrity, edema, etc.): pt with noted bilat hand edema    Exercises     Assessment/Plan    PT Assessment Patient needs continued PT services  PT Problem List Decreased strength;Decreased activity tolerance;Decreased balance;Decreased mobility;Decreased coordination;Decreased cognition;Decreased knowledge of use of DME;Decreased safety awareness       PT Treatment Interventions DME instruction;Gait training;Stair training;Functional mobility training;Therapeutic activities;Therapeutic exercise;Balance training;Neuromuscular re-education;Cognitive remediation;Patient/family education    PT Goals (Current goals can be found in the Care Plan section)  Acute Rehab PT Goals Patient Stated Goal: didn't state PT Goal Formulation: Patient unable to participate in goal setting Time For Goal Achievement: 03/09/19 Potential to Achieve Goals: Good    Frequency Min 3X/week   Barriers to discharge Decreased caregiver support unsure of capabilities of parents    Co-evaluation               AM-PAC PT "6 Clicks" Mobility  Outcome Measure Help needed turning from your back to your side while in a flat bed without using bedrails?: A Little Help needed moving from lying on your back to sitting on the side of a flat bed without using bedrails?: A Little Help needed moving to and from a bed to a chair (including a wheelchair)?: A Little Help needed standing up from a chair using your arms (e.g.,  wheelchair or bedside chair)?: A Little Help needed to walk in hospital room?: A Little Help needed climbing 3-5 steps with a railing? : A Lot 6 Click Score: 17    End of Session Equipment Utilized During Treatment: Gait belt Activity Tolerance: Patient tolerated treatment well Patient left: in chair;with call bell/phone within reach;with chair alarm set;with nursing/sitter in room Nurse Communication: Mobility status PT Visit Diagnosis: Unsteadiness on feet (R26.81);Muscle weakness (generalized) (M62.81);Difficulty in walking, not elsewhere classified (R26.2)    Time: 6568-1275 PT Time Calculation (min) (ACUTE ONLY): 21 min   Charges:   PT Evaluation $PT Eval Moderate Complexity: 1 Mod          Lewis Shock, PT, DPT Acute Rehabilitation Services Pager #: [phone] Office #: [phone]   Iona Hansen 02/23/2019, 11:42 AM

## 2019-02-23 NOTE — Progress Notes (Signed)
[facility]    NEPHROLOGY PROGRESS NOTE  SUBJECTIVE: Patient seen and examined.  Is awake and conversant.  Denies chest pain, shortness of breath, nausea, vomiting, diarrhea or dysuria.  All other review of systems are negative.   OBJECTIVE:  Vitals:   02/23/19 0757 02/23/19 1100  BP: 139/79 (!) 141/86  Pulse: 80 81  Resp:  (!) 24  Temp:  (!) 97.4 F (36.3 C)  SpO2:  95%    Intake/Output Summary (Last 24 hours) at 02/23/2019 1217 Last data filed at 02/23/2019 3825 Gross per 24 hour  Intake 2668.28 ml  Output 1225 ml  Net 1443.28 ml      General: Alert to self, NAD HEENT: MMM [place] AT anicteric sclera Neck:  No JVD, no adenopathy CV:  Heart RRR  Lungs:  L/S CTA bilaterally Abd:  abd SNT/ND with normal BS GU:  Bladder non-palpable Extremities:  No LE edema. Skin:  No skin rash  MEDICATIONS:  . chlorhexidine  15 mL Mouth Rinse BID  . cloNIDine  0.2 mg Transdermal Q Sat  . folic acid  1 mg Oral Daily  . insulin aspart  0-9 Units Subcutaneous TID WC  . lactulose  300 mL Rectal BID  . mouth rinse  15 mL Mouth Rinse q12n4p  . multivitamin with minerals  1 tablet Oral Daily  . nicotine  14 mg Transdermal Daily  . sodium chloride flush  10-40 mL Intracatheter Q12H  . thiamine  100 mg Oral Daily       LABS:   CBC Latest Ref Rng & Units 02/23/2019 02/22/2019 02/21/2019  WBC 4.0 - 10.5 K/uL 6.0 5.0 4.6  Hemoglobin 13.0 - 17.0 g/dL 9.6(L) 10.1(L) 10.9(L)  Hematocrit 39.0 - 52.0 % 28.5(L) 30.0(L) 31.8(L)  Platelets 150 - 400 K/uL 95(L) 91(L) 98(L)    CMP Latest Ref Rng & Units 02/23/2019 02/22/2019 02/22/2019  Glucose 70 - 99 mg/dL 229(H) - 104(H)  BUN 6 - 20 mg/dL 26(H) - 32(H)  Creatinine 0.61 - 1.24 mg/dL 1.99(H) - 2.24(H)  Sodium 135 - 145 mmol/L 142 - 148(H)  Potassium 3.5 - 5.1 mmol/L 3.1(L) 2.9(L) 2.5(LL)  Chloride 98 - 111 mmol/L 113(H) - 118(H)  CO2 22 - 32 mmol/L 22 - 21(L)  Calcium 8.9 - 10.3 mg/dL 7.4(L) - 7.7(L)  Total Protein 6.5 - 8.1 g/dL  4.2(L) - 4.5(L)  Total Bilirubin 0.3 - 1.2 mg/dL 5.1(H) - 5.2(H)  Alkaline Phos 38 - 126 U/L 263(H) - 250(H)  AST 15 - 41 U/L 149(H) - 421(H)  ALT 0 - 44 U/L 736(H) - 1,244(H)    Lab Results  Component Value Date   CALCIUM 7.4 (L) 02/23/2019   CAION 0.97 (L) 02/18/2019   PHOS 2.5 12/08/2010       Component Value Date/Time   COLORURINE AMBER (A) 02/22/2019 1758   APPEARANCEUR CLOUDY (A) 02/22/2019 1758   LABSPEC 1.012 02/22/2019 1758   PHURINE 5.0 02/22/2019 1758   GLUCOSEU 50 (A) 02/22/2019 1758   HGBUR LARGE (A) 02/22/2019 1758   BILIRUBINUR NEGATIVE 02/22/2019 1758   BILIRUBINUR negative 03/15/2018 1049   BILIRUBINUR negative 12/26/2017 [place] 02/22/2019 1758   PROTEINUR 30 (A) 02/22/2019 1758   UROBILINOGEN 0.2 03/15/2018 1049   UROBILINOGEN 0.2 12/08/2010 1613   NITRITE POSITIVE (A) 02/22/2019 1758   LEUKOCYTESUR LARGE (A) 02/22/2019 1758      Component Value Date/Time   HCO3 23.6 02/18/2019 1959   TCO2 24 02/18/2019 1959   O2SAT 96.0 02/18/2019  1959       Component Value Date/Time   FERRITIN 269 12/03/2018 0348       ASSESSMENT/PLAN:    1.  Baseline serum creatinine around 1.  2.  Acute kidney injury.  Suspect multifactorial, possibly related to some hepatorenal physiology versus bilirubin toxicity versus volume depletion.  Agree with IV fluids as patient is not eating.    Has around 0.6 g of proteinuria. Urine output appears to be excellent, arguing against hepatorenal syndrome and volume depletion.  Also had some component of bladder outlet obstruction.  Also has a history of cocaine and alcohol use, which can certainly cause renal injury.  Renal function is improving.  3.  Acute liver failure.  Improving.  Status post N-acetylcysteine.  4.  Hypokalemia.  Persistent.  Suspect related to shifts and ammonia level.    Also with underlying renal losses secondary to alcohol use.  Continue potassium supplementation.  Magnesium stable.  5.   Hepatic encephalopathy.  Continue to monitor mental status closely.  Much improved.  6.  DKA.  Improved.  7.  Hypertension.    Clonidine patch increased to 0.2 on 02/22/2019  8.  Nephrolithiasis.  He has a 19 mm mid to lower left renal calculus causing mild to moderate dilatation of the adjacent collecting system.  Recommend urology evaluation.     [address] [place], DO, [place]

## 2019-02-23 NOTE — Progress Notes (Signed)
Called patient's brother, Merry Proud, and gave him an update- that patient is doing better, but not yet healthy.

## 2019-02-23 NOTE — Progress Notes (Signed)
PROGRESS NOTE                                                                                                                                                                                                             Patient Demographics:    Rodney Barber, is a 53 y.o. male, DOB - 1966/02/27, ZOX:[id number]  Admit date - 02/18/2019   Admitting Physician Charlsie QuestVishal R Patel, MD  Outpatient Primary MD for the patient is Claiborne RiggFleming, Zelda W, NP  LOS - 5  Chief Complaint  Patient presents with   Hyperglycemia    CBG=526       Brief Narrative  Rodney HeaterDavid Richardsonis a 53 y.o.malewith medical history significant foruncontrolled insulin-dependent type 2 diabetes, hypertension, chronic pancreatitis with history of chronic biliary stenosis, tobacco use, alcohol use, and substance use disorder who presents to the ED for evaluation of persistent nausea and vomiting.  Was found to have DKA, extremely elevated LFTs and possible acetaminophen toxicity with fulminant acute liver failure and acute hepatitis and admitted to the hospital.   Subjective:   Patient in bed, appears comfortable, denies any headache, no fever, no chest pain or pressure, no shortness of breath , no abdominal pain.  Does complain of severe generalized weakness, is hungry wants to eat.   Assessment  & Plan :     1.  Acute hepatic and metabolic encephalopathy from DKA along with Acute liver failure, acute hepatitis and acetaminophen toxicity.  He was initially obtunded, he has been treated with IV acetylcysteine under the guidance of poison control, LFTs and INR have improved on 02/22/2019 and Poison control has advised that acetylcysteine be stopped on 02/22/2019 which was done.    With supportive care which included IV fluids he has now improved, mentation is improving and he is answering basic questions, liver synthetic function and LFTs are improving, continue monitoring advance activity, PT and speech to evaluate, soft  diet with feeding assistance and aspiration precautions for now.  GI on board as well.   2.  Acute liver failure induced coagulopathy.  INR now trending down, had peaked at 4.4.  Continue to monitor.  3.  Severe hypokalemia.  Replaced again IV will continue to monitor.  4.  AKI.  Due to combination of dehydration and bladder outlet obstruction.  Likely has some element of ATN, now improving with hydration, nephrology on board, renal ultrasound shows nonobstructing kidney stones with evidence of some chronic kidney disease, continue hydration for now and monitor renal function, he  needs to have good urine output, no indication for HD.   5.  History of chronic pancreatitis.  Stable lipase.  GI to follow.  Has history of CBD stent.  Will follow with Dr. Ewing Schlein for it.  6.  History of substance abuse.  Uses cocaine and alcohol.  He was counseled to quit both.  No signs of DTs.  Monitor.  On CIWA protocol but for now hold any further Ativan since he is obtunded.  7. Severe PCM - dietary consult once more awake.  8. DKA DM2 - Lantus + iSS. Poor outpatient control due to hyperglycemia.  Lab Results  Component Value Date   HGBA1C 17.7 (H) 02/18/2019    CBG (last 3)  Recent Labs    02/23/19 0146 02/23/19 0438 02/23/19 0759  GLUCAP 184* 195* 249*     Family Communication  : brother 02/22/19  Code Status :  DNR  Disposition Plan  :  Tele.  Consults  :    Renal  GI - Dr Janeece Agee Poison control   [facility] ? Hepatology, Dr. Huston Foley ? General medicine, Dr. Laurine Blazer  Procedures  :    Renal US -    1. 19 mm mid to lower left renal calculus causing mild to moderate dilatation of the upper pole collecting system. 2. Additional smaller, nonobstructing calculi in the lower pole of the left kidney. 3. 10 mm nonobstructing right renal calculus. 4. Borderline increased echogenicity of both kidneys, suggesting the possibility of mild medical renal  disease. 5. Mild amount of pericholecystic fluid without significant change and minimal ascites.  RUQ [place] - 1. Positive for cholelithiasis, borderline to mild gallbladder wall thickening, borderline to mildly dilated CBD, and trace pericholecystic fluid. No sonographic [name] sign was elicited, and there is no intrahepatic biliary dilatation to strongly suggest acute duct obstruction. However, consider the possibility of choledocholithiasis. MRCP versus Nuclear Medicine Hepatobiliary Scan could be valuable in this clinical setting. 2. Nephrolithiasis. No other acute ultrasound finding in the abdomen.  CT head - Non acute    DVT Prophylaxis  :    SCDs   Lab Results  Component Value Date   PLT 95 (L) 02/23/2019    Diet :  Diet Order            DIET SOFT Room service appropriate? Yes; Fluid consistency: Nectar Thick  Diet effective now               Inpatient Medications Scheduled Meds:  chlorhexidine  15 mL Mouth Rinse BID   cloNIDine  0.2 mg Transdermal Q Sat   folic acid  1 mg Oral Daily   insulin aspart  0-9 Units Subcutaneous TID WC   lactulose  300 mL Rectal BID   mouth rinse  15 mL Mouth Rinse q12n4p   multivitamin with minerals  1 tablet Oral Daily   nicotine  14 mg Transdermal Daily   sodium chloride flush  10-40 mL Intracatheter Q12H   thiamine  100 mg Oral Daily   Continuous Infusions:  dextrose 50 mL/hr at 02/23/19 0741   lactated ringers with kcl     lactated ringers 100 mL/hr at 02/23/19 0737   magnesium sulfate bolus IVPB 1 g (02/23/19 0923)   potassium chloride 10 mEq (02/23/19 1013)   PRN Meds:.hydrALAZINE, promethazine  Antibiotics  :   Anti-infectives (From admission, onward)   None          Objective:   Vitals:   02/23/19  0422 02/23/19 0429 02/23/19 0743 02/23/19 0757  BP:   139/79 139/79  Pulse: (!) 127 84  80  Resp: 18 19    Temp:   (!) 97.5 F (36.4 C)   TempSrc:   Oral   SpO2: 100% 97%    Weight:      Height:         Wt Readings from Last 3 Encounters:  02/22/19 49.3 kg  12/04/18 59 kg  03/15/18 53 kg     Intake/Output Summary (Last 24 hours) at 02/23/2019 1015 Last data filed at 02/23/2019 0907 Gross per 24 hour  Intake 2668.28 ml  Output 1225 ml  Net 1443.28 ml     Physical Exam  Awake Alert, No new F.N deficits,   [place].AT,PERRAL Supple Neck,No JVD, No cervical lymphadenopathy appriciated.  Symmetrical Chest wall movement, Good air movement bilaterally, CTAB RRR,No Gallops, Rubs or new Murmurs, No Parasternal Heave +ve B.Sounds, Abd Soft, No tenderness, No organomegaly appriciated, No rebound - guarding or rigidity. No Cyanosis, Clubbing or edema, No new Rash or bruise    Data Review:    CBC Recent Labs  Lab 02/18/19 1945  02/19/19 0630 02/20/19 0504 02/21/19 0511 02/22/19 0433 02/23/19 0341  WBC 11.5*  --  10.2 5.8 4.6 5.0 6.0  HGB 12.7*   < > 13.6 11.7* 10.9* 10.1* 9.6*  HCT 37.7*   < > 41.2 35.1* 31.8* 30.0* 28.5*  PLT 233  --  183 110* 98* 91* 95*  MCV 89.5  --  89.6 88.6 87.1 88.8 89.3  MCH 30.2  --  29.6 29.5 29.9 29.9 30.1  MCHC 33.7  --  33.0 33.3 34.3 33.7 33.7  RDW 13.3  --  13.2 13.3 13.2 13.5 13.5  LYMPHSABS 0.7  --   --   --   --   --   --   MONOABS 0.1  --   --   --   --   --   --   EOSABS 0.0  --   --   --   --   --   --   BASOSABS 0.0  --   --   --   --   --   --    < > = values in this interval not displayed.    Chemistries  Recent Labs  Lab 02/19/19 0305  02/20/19 0808 02/20/19 1658 02/21/19 0511 02/21/19 1653 02/22/19 0433 02/22/19 1806 02/23/19 0341  NA  --    < >  --  147* 146* 148* 148*  --  142  K  --    < >  --  3.3* 2.6* 3.5 2.5* 2.9* 3.1*  CL  --    < >  --  112* 113* 115* 118*  --  113*  CO2  --    < >  --  22 23 23  21*  --  22  GLUCOSE  --    < >  --  134* 143* 66* 104*  --  229*  BUN  --    < >  --  34* 34* 34* 32*  --  26*  CREATININE  --    < >  --  2.42* 2.38* 2.35* 2.24*  --  1.99*  CALCIUM  --    < >  --  7.9* 7.8* 7.7*  7.7*  --  7.4*  MG 2.8*  --  2.4  --  2.0  --  1.7  --  1.7  AST  --    < >  --  4,848* 1,879* 855* 421*  --  149*  ALT  --    < >  --  3,045* 2,254* 1,706* 1,244*  --  736*  ALKPHOS  --    < >  --  166* 193* 216* 250*  --  263*  BILITOT  --    < >  --  3.4* 4.5* 5.0* 5.2*  --  5.1*   < > = values in this interval not displayed.   ------------------------------------------------------------------------------------------------------------------ No results for input(s): CHOL, HDL, LDLCALC, TRIG, CHOLHDL, LDLDIRECT in the last 72 hours.  Lab Results  Component Value Date   HGBA1C 17.7 (H) 02/18/2019   ------------------------------------------------------------------------------------------------------------------ No results for input(s): TSH, T4TOTAL, T3FREE, THYROIDAB in the last 72 hours.  Invalid input(s): FREET3 ------------------------------------------------------------------------------------------------------------------ No results for input(s): VITAMINB12, FOLATE, FERRITIN, TIBC, IRON, RETICCTPCT in the last 72 hours.  Coagulation profile Recent Labs  Lab 02/20/19 1658 02/21/19 0511 02/21/19 1653 02/22/19 0433 02/23/19 0341  INR 2.2* 1.9* 1.6* 1.7* 1.4*    No results for input(s): DDIMER in the last 72 hours.  Cardiac Enzymes No results for input(s): CKMB, TROPONINI, MYOGLOBIN in the last 168 hours.  Invalid input(s): CK ------------------------------------------------------------------------------------------------------------------ No results found for: BNP  Micro Results Recent Results (from the past 240 hour(s))  SARS Coronavirus 2 (CEPHEID - Performed in [facility] hospital lab), Hosp Order     Status: None   Collection Time: 02/18/19 10:38 PM   Specimen: Nasopharyngeal Swab  Result Value Ref Range Status   SARS Coronavirus 2 NEGATIVE NEGATIVE Final    Comment: (NOTE) If result is NEGATIVE SARS-CoV-2 target nucleic acids are NOT DETECTED. The  SARS-CoV-2 RNA is generally detectable in upper and lower  respiratory specimens during the acute phase of infection. The lowest  concentration of SARS-CoV-2 viral copies this assay can detect is 250  copies / mL. A negative result does not preclude SARS-CoV-2 infection  and should not be used as the sole basis for treatment or other  patient management decisions.  A negative result may occur with  improper specimen collection / handling, submission of specimen other  than nasopharyngeal swab, presence of viral mutation(s) within the  areas targeted by this assay, and inadequate number of viral copies  (<250 copies / mL). A negative result must be combined with clinical  observations, patient history, and epidemiological information. If result is POSITIVE SARS-CoV-2 target nucleic acids are DETECTED. The SARS-CoV-2 RNA is generally detectable in upper and lower  respiratory specimens dur ing the acute phase of infection.  Positive  results are indicative of active infection with SARS-CoV-2.  Clinical  correlation with patient history and other diagnostic information is  necessary to determine patient infection status.  Positive results do  not rule out bacterial infection or co-infection with other viruses. If result is PRESUMPTIVE POSTIVE SARS-CoV-2 nucleic acids MAY BE PRESENT.   A presumptive positive result was obtained on the submitted specimen  and confirmed on repeat testing.  While 2019 novel coronavirus  (SARS-CoV-2) nucleic acids may be present in the submitted sample  additional confirmatory testing may be necessary for epidemiological  and / or clinical management purposes  to differentiate between  SARS-CoV-2 and other Sarbecovirus currently known to infect humans.  If clinically indicated additional testing with an alternate test  methodology [phone]) is advised. The SARS-CoV-2 RNA is generally  detectable in upper and lower respiratory sp ecimens during the acute   phase of infection. The expected result is Negative. Fact Sheet for Patients:  BoilerBrush.com.cyhttps://www.fda.gov/media/136312/download Fact Sheet for Healthcare Providers: https://pope.com/https://www.fda.gov/media/136313/download This test is not yet approved or cleared by the [place] FDA and has been authorized for detection and/or diagnosis of SARS-CoV-2 by FDA under an Emergency Use Authorization (EUA).  This EUA will remain in effect (meaning this test can be used) for the duration of the COVID-19 declaration under Section 564(b)(1) of the Act, 21 U.S.C. section 360bbb-3(b)(1), unless the authorization is terminated or revoked sooner. Performed at [facility], [address] [place], [place] [address]   MRSA PCR Screening     Status: None   Collection Time: 02/19/19  2:50 AM   Specimen: Nasal Mucosa; Nasopharyngeal  Result Value Ref Range Status   MRSA by PCR NEGATIVE NEGATIVE Final    Comment:        The GeneXpert MRSA Assay (FDA approved for NASAL specimens only), is one component of a comprehensive MRSA colonization surveillance program. It is not intended to diagnose MRSA infection nor to guide or monitor treatment for MRSA infections. Performed at [facility], [address] [place], [place] [address]     Radiology Reports Ct Head Wo Contrast  Result Date: 02/21/2019 CLINICAL DATA:  Altered mental status EXAM: CT HEAD WITHOUT CONTRAST TECHNIQUE: Contiguous axial images were obtained from the base of the skull through the vertex without intravenous contrast. COMPARISON:  December 02, 2018 FINDINGS: Brain: Ventricles and sulci are within normal limits. There is no intracranial mass, hemorrhage, extra-axial fluid collection, or midline shift. Brain parenchyma appears unremarkable. No acute infarct evident. Vascular: There is no hyperdense vessel. There is calcification in each carotid siphon region. Skull: Bony calvarium appears intact. Sinuses/Orbits: There are foci of  opacification in each maxillary antrum, more notable on the left than on the right. There is mucosal thickening in several ethmoid air cells. Orbits appear symmetric bilaterally. Other: There is a small air-fluid level in a posteroinferior mastoid air cell on the left. Mastoids elsewhere are clear. IMPRESSION: Brain parenchyma appears unremarkable.  No mass or hemorrhage. There are foci of arterial vascular calcification. There are areas of paranasal sinus disease. A small air-fluid level is noted in inferior posterior left mastoid air cell. Other mastoids are clear. Electronically Signed   By: Bretta BangWilliam  Woodruff III M.D.   On: 02/21/2019 11:03   [place] Abdomen Complete  Result Date: 02/19/2019 CLINICAL DATA:  53 year old male with abnormal LFTs. EXAM: ABDOMEN ULTRASOUND COMPLETE COMPARISON:  Abdomen ultrasound 12/02/2018. CT Abdomen and Pelvis 07/10/2013. FINDINGS: Gallbladder: A shadowing echogenic stone is identified on image 5, about 6 millimeters. Gallbladder wall thickness is at the upper limits of normal to mildly increased. There is trace pericholecystic fluid on image 14. No sonographic Murphy sign elicited. Decreased sludge compared to the prior ultrasound. Common bile duct: Diameter: 7 millimeters, increased since April and mildly abnormal. No filling defect identified within the visible duct. Liver: No intrahepatic biliary ductal dilatation. Liver echogenicity within normal limits. No discrete liver lesion identified. Portal vein is patent on color Doppler imaging with normal direction of blood flow towards the liver. IVC: No abnormality visualized. Pancreas: Not well visualized. Spleen: Not well visualized. Right Kidney: Length: 10.1 centimeters. No hydronephrosis. Renal echogenicity within normal limits. Shadowing lower pole calculus estimated at 7-8 millimeters (image 82). Left Kidney: Length: 10.4 centimeters. Left renal echogenicity within normal limits. 1 large versus multiple shadowing calculi near  the renal hilum (image 95). No left hydronephrosis. A lower pole calculus estimated at 9 millimeters is identified on image 103. Abdominal  aorta: Negative for aneurysm. Positive for irregularity compatible with atherosclerosis. Other findings: Fluid distended stomach.  No free fluid. IMPRESSION: 1. Positive for cholelithiasis, borderline to mild gallbladder wall thickening, borderline to mildly dilated CBD, and trace pericholecystic fluid. No sonographic [name] sign was elicited, and there is no intrahepatic biliary dilatation to strongly suggest acute duct obstruction. However, consider the possibility of choledocholithiasis. MRCP versus Nuclear Medicine Hepatobiliary Scan could be valuable in this clinical setting. 2. Nephrolithiasis. No other acute ultrasound finding in the abdomen. Electronically Signed   By: Odessa Fleming M.D.   On: 02/19/2019 01:03   US Renal  Result Date: 02/22/2019 CLINICAL DATA:  Acute renal failure.  Diabetes.  Hypertension. EXAM: RENAL / URINARY TRACT ULTRASOUND COMPLETE COMPARISON:  Abdomen ultrasound dated 02/19/2019. FINDINGS: Right Kidney: Renal measurements: 10.4 x 6.0 x 5.6 cm = volume: 181 mL. 10 mm shadowing calculus in the lower pole. Borderline echogenic. No hydronephrosis. Left Kidney: Renal measurements: 10.7 x 6.0 x 6.0 cm = volume: 200 mL. 19 mm shadowing calculus in the central portion of the kidney with mild to moderate dilatation of the adjacent portion of the collecting system superiorly. There are also adjacent calculi in the lower pole of the left kidney, spanning 8 mm. Borderline echogenic. Bladder: Empty with a Foley catheter in place. Other: Mild pericholecystic fluid, similar to the recent abdomen ultrasound. Minimal ascites. IMPRESSION: 1. 19 mm mid to lower left renal calculus causing mild to moderate dilatation of the upper pole collecting system. 2. Additional smaller, nonobstructing calculi in the lower pole of the left kidney. 3. 10 mm nonobstructing right  renal calculus. 4. Borderline increased echogenicity of both kidneys, suggesting the possibility of mild medical renal disease. 5. Mild amount of pericholecystic fluid without significant change and minimal ascites. Electronically Signed   By: Beckie Salts M.D.   On: 02/22/2019 15:22   Dg Chest Port 1 View  Result Date: 02/19/2019 CLINICAL DATA:  Cough EXAM: PORTABLE CHEST 1 VIEW COMPARISON:  12/02/2018 FINDINGS: Heart and mediastinal contours are within normal limits. No focal opacities or effusions. No acute bony abnormality. IMPRESSION: No active disease. Electronically Signed   By: Charlett Nose M.D.   On: 02/19/2019 17:42    Time Spent in minutes  30   Susa Raring M.D on 02/23/2019 at 10:15 AM  To page go to [email or web address] - password [facility]

## 2019-02-23 NOTE — Progress Notes (Signed)
  Palliative medicine progress note  Patient seen, chart reviewed.  Patient continues to improve as exhibited by improving laboratory values, and increased level of alertness.  When I entered the room today he was sitting up in a recliner and did answer questions more clearly as well as in full sentences.  He is still quite ill, lethargic at times.  Awaiting to see what speech therapy evaluation shows.  Patient Profile:53 y.o.malewith past medical history of uncontrolled diabetes (recent hemoglobin A1c 17.7),, hypertension, chronic pancreatitis, biliary stenosis, alcohol use disorder in remission, polysubstance use disorder,admitted on 7/14/2020with nausea and vomiting. Patient was found to have markedly elevated liver enzymes: AST 5859, ALT 2091, acute kidney injury creatinine 2.44 as well as Tylenol toxicity, 135. Despite supportive interventions interventions as well as medications to address Tylenol toxicity, laboratory values are improving, but patient remains encephalopathic  Consult ordered for goals of care in the setting of hepatic failure, Tylenol toxicity, DKA, coagulopathy.  Transfer to tertiary care center with a hepatologist being considered  02/23/19 Encephalopathy improving as exhibited by improving laboratory values as well as increased level of consciousness  Plan Care limits of DNR set otherwise continue to treat the treatable It is not clear whether this was a deliberate Tylenol overdose or not.  However, patient has had significant losses in the last several months and would benefit from psychiatric support  Palliative medicine to shadow for decline and will re-intervene if new goals of care, care limits need to be readdressed  Prognosis To be determined  Disposition To be determined  Thank you, Romona Curls, NP Total time: 20 minutes Greater than 50% of time was spent in counseling and coordination of care

## 2019-02-23 NOTE — Progress Notes (Signed)
  Speech Language Pathology Treatment: Dysphagia  Patient Details Name: Rodney Barber MRN: [id number] DOB: 03-30-1966 Today's Date: 02/23/2019 Time: 8338-2505 SLP Time Calculation (min) (ACUTE ONLY): 20 min  Assessment / Plan / Recommendation Clinical Impression  Pt more alert this session as he was able to converse and answer simple questions and nursing stated he had improved overall with mentation and was taken off of [employer] and tolerating room air currently.  Pt noted to have delayed cough after intake of solids, thin via cup edge (although pt was somewhat impulsive during intake despite min verbal cueing for slow rate), but otherwise, no overt s/s of aspiration noted during intake; belching noted during treatment as well which may suggest potential esophageal involvement; GI consulted and following pt currently; ST will continue to f/u for potential esophageal component to swallow and diet tolerance; recommend upgrade to Dysphagia 3 (mechanical/soft)/thin liquids with esophageal precautions.  HPI HPI: Patient is a 53 y.o. 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  He was going to be transferred to [facility] liver unit but his liver test improved with treatment at [facility].      SLP Plan  Continue with current plan of care; goals updated       Recommendations  Diet recommendations: Dysphagia 3 (mechanical soft);Thin liquid Liquids provided via: Cup Medication Administration: Whole meds with puree Supervision: Patient able to self feed;Staff to assist with self feeding;Intermittent supervision to cue for compensatory strategies Compensations: Minimize environmental distractions;Slow rate;Small sips/bites Postural Changes and/or Swallow Maneuvers: Seated upright 90 degrees                Oral Care Recommendations: Oral care  BID Follow up Recommendations: Skilled Nursing facility SLP Visit Diagnosis: Dysphagia, unspecified (R13.10) Plan: Continue with current plan of care                       Elvina Sidle, M.S., CCC-SLP 02/23/2019, 11:31 AM

## 2019-02-23 NOTE — Progress Notes (Signed)
[facility] PROGRESS NOTE Subjective Patient admitted with chronic pancreatitis and hepatic injury due to Tylenol overdose and alcohol.  He had hepatic failure initially was to be referred to [facility] but is turned the corner with N-acetylcysteine and INR is back nearly to normal now.  He is more alert and hungry  Objective: Vital signs in last 24 hours: Temp:  [97.4 F (36.3 C)-99.4 F (37.4 C)] 97.4 F (36.3 C) (07/19 1100) Pulse Rate:  [27-127] 81 (07/19 1100) Resp:  [18-38] 24 (07/19 1100) BP: (139-170)/(75-98) 141/86 (07/19 1100) SpO2:  [91 %-100 %] 95 % (07/19 1100) Last BM Date: 02/23/19  Intake/Output from previous day: 07/18 0701 - 07/19 0700 In: 2662.5 [I.V.:1398.8; IV Piggyback:663.7] Out: 550 [Urine:550] Intake/Output this shift: Total I/O In: 300 [P.O.:200; IV Piggyback:100] Out: 675 [Urine:675]  PE: General--patient sitting up in chair answers questions appropriately moves extremities on command   Lab Results: Recent Labs    02/21/19 0511 02/22/19 0433 02/23/19 0341  WBC 4.6 5.0 6.0  HGB 10.9* 10.1* 9.6*  HCT 31.8* 30.0* 28.5*  PLT 98* 91* 95*   BMET Recent Labs    02/20/19 1658 02/21/19 0511 02/21/19 1653 02/22/19 0433 02/22/19 1806 02/23/19 0341  NA 147* 146* 148* 148*  --  142  K 3.3* 2.6* 3.5 2.5* 2.9* 3.1*  CL 112* 113* 115* 118*  --  113*  CO2 22 23 23  21*  --  22  CREATININE 2.42* 2.38* 2.35* 2.24*  --  1.99*   LFT Recent Labs    02/21/19 1653 02/22/19 0433 02/23/19 0341  PROT 4.5* 4.5* 4.2*  AST 855* 421* 149*  ALT 1,706* 1,244* 736*  ALKPHOS 216* 250* 263*  BILITOT 5.0* 5.2* 5.1*   PT/INR Recent Labs    02/21/19 1653 02/22/19 0433 02/23/19 0341  LABPROT 18.9* 19.5* 16.9*  INR 1.6* 1.7* 1.4*   PANCREAS No results for input(s): LIPASE in the last 72 hours.       Studies/Results: US Renal  Result Date: 02/22/2019 CLINICAL DATA:  Acute renal failure.  Diabetes.  Hypertension. EXAM: RENAL / URINARY TRACT  ULTRASOUND COMPLETE COMPARISON:  Abdomen ultrasound dated 02/19/2019. FINDINGS: Right Kidney: Renal measurements: 10.4 x 6.0 x 5.6 cm = volume: 181 mL. 10 mm shadowing calculus in the lower pole. Borderline echogenic. No hydronephrosis. Left Kidney: Renal measurements: 10.7 x 6.0 x 6.0 cm = volume: 200 mL. 19 mm shadowing calculus in the central portion of the kidney with mild to moderate dilatation of the adjacent portion of the collecting system superiorly. There are also adjacent calculi in the lower pole of the left kidney, spanning 8 mm. Borderline echogenic. Bladder: Empty with a Foley catheter in place. Other: Mild pericholecystic fluid, similar to the recent abdomen ultrasound. Minimal ascites. IMPRESSION: 1. 19 mm mid to lower left renal calculus causing mild to moderate dilatation of the upper pole collecting system. 2. Additional smaller, nonobstructing calculi in the lower pole of the left kidney. 3. 10 mm nonobstructing right renal calculus. 4. Borderline increased echogenicity of both kidneys, suggesting the possibility of mild medical renal disease. 5. Mild amount of pericholecystic fluid without significant change and minimal ascites. Electronically Signed   By: Beckie Salts M.D.   On: 02/22/2019 15:22    Medications: I have reviewed the patient's current medications.  Assessment:   1.  Acute hepatic injury.  Probably due to combination of acetaminophen overdose and alcohol toxicity.  Liver test still elevated but continued improvement.  INR is now back to normal  at 1.4.  Patient is much more alert.   Plan: At this point I do not feel that he needs any further GI follow-up.  He will obviously need to stop drinking and therapy regarding his acetaminophen overdose etc.  We will sign off but please call us again if needed   Nancy Fetter 02/23/2019, 11:28 AM  This note was created using voice recognition software. Minor errors may Have occurred unintentionally.  Pager:  [phone] If no answer or after hours call [phone]

## 2019-02-23 NOTE — Progress Notes (Signed)
   02/22/19 2331  Stool Characteristics  Stool Type Type 6 (Mushy consistency with ragged edges)  Stool Descriptors Brown  Stool Amount Large  Stool Source Rectum   Patient given lactulose enema diluted with 700 mL water.  Patient able to retain ~900-925 mL.  Patient tolerated procedure well.

## 2019-02-23 NOTE — Progress Notes (Signed)
Inpatient Diabetes Program Recommendations  AACE/ADA: New Consensus Statement on Inpatient Glycemic Control (2015)  Target Ranges:  Prepandial:   less than 140 mg/dL      Peak postprandial:   less than 180 mg/dL (1-2 hours)      Critically ill patients:  140 - 180 mg/dL   Lab Results  Component Value Date   GLUCAP 274 (H) 02/23/2019   HGBA1C 17.7 (H) 02/18/2019    Review of Glycemic Control  Blood sugars 195-274 mg/dL. Eating 25% meals. FBS this am 229 and 249 mg/dL. Would benefit from adding basal insulin.  Inpatient Diabetes Program Recommendations:     Add Levemir 8 units QHS  Secure text sent to MD.  Will continue to follow.  Thank you. Lorenda Peck, RD, LDN, CDE Inpatient Diabetes Coordinator [phone]

## 2019-02-23 NOTE — Plan of Care (Signed)
  Problem: Clinical Measurements: Goal: Will remain free from infection Outcome: Progressing Goal: Diagnostic test results will improve Outcome: Progressing Goal: Respiratory complications will improve Outcome: Progressing Goal: Cardiovascular complication will be avoided Outcome: Progressing   Problem: Activity: Goal: Risk for activity intolerance will decrease Outcome: Progressing   Problem: Coping: Goal: Level of anxiety will decrease Outcome: Progressing   Problem: Elimination: Goal: Will not experience complications related to bowel motility Outcome: Progressing Goal: Will not experience complications related to urinary retention Outcome: Progressing   Problem: Pain Managment: Goal: General experience of comfort will improve Outcome: Progressing   Problem: Safety: Goal: Ability to remain free from injury will improve Outcome: Progressing   Problem: Skin Integrity: Goal: Risk for impaired skin integrity will decrease Outcome: Progressing   Problem: Cardiac: Goal: Ability to maintain an adequate cardiac output will improve Outcome: Progressing   Problem: Education: Goal: Knowledge of General Education information will improve Description: Including pain rating scale, medication(s)/side effects and non-pharmacologic comfort measures Outcome: Not Progressing Note: No progress made towards goal; patient too psychologically unstable.   Problem: Clinical Measurements: Goal: Ability to maintain clinical measurements within normal limits will improve Outcome: Not Progressing Note: Elevated blood pressure noted during shift.   Problem: Nutrition: Goal: Adequate nutrition will be maintained Outcome: Not Progressing Note: Inadequate nutrition suspected.

## 2019-02-24 ENCOUNTER — Inpatient Hospital Stay (HOSPITAL_COMMUNITY): Payer: Self-pay

## 2019-02-24 LAB — AMMONIA: Ammonia: 31 umol/L (ref 9–35)

## 2019-02-24 LAB — BPAM RBC
Blood Product Expiration Date: 202008142359
Blood Product Expiration Date: 202008142359
ISSUE DATE / TIME: 202007131824
Unit Type and Rh: 5100
Unit Type and Rh: 5100

## 2019-02-24 LAB — TYPE AND SCREEN
ABO/RH(D): O POS
Antibody Screen: POSITIVE
Donor AG Type: NEGATIVE
Donor AG Type: NEGATIVE
Unit division: 0
Unit division: 0

## 2019-02-24 LAB — PHOSPHORUS: Phosphorus: 1 mg/dL — CL (ref 2.5–4.6)

## 2019-02-24 LAB — COMPREHENSIVE METABOLIC PANEL
ALT: 547 U/L — ABNORMAL HIGH (ref 0–44)
AST: 108 U/L — ABNORMAL HIGH (ref 15–41)
Albumin: 2 g/dL — ABNORMAL LOW (ref 3.5–5.0)
Alkaline Phosphatase: 430 U/L — ABNORMAL HIGH (ref 38–126)
Anion gap: 7 (ref 5–15)
BUN: 21 mg/dL — ABNORMAL HIGH (ref 6–20)
CO2: 19 mmol/L — ABNORMAL LOW (ref 22–32)
Calcium: 7.6 mg/dL — ABNORMAL LOW (ref 8.9–10.3)
Chloride: 112 mmol/L — ABNORMAL HIGH (ref 98–111)
Creatinine, Ser: 1.44 mg/dL — ABNORMAL HIGH (ref 0.61–1.24)
GFR calc Af Amer: >60 mL/min (ref >60)
GFR calc non Af Amer: 55 mL/min — ABNORMAL LOW (ref >60)
Glucose, Bld: 172 mg/dL — ABNORMAL HIGH (ref 70–99)
Potassium: 3.8 mmol/L (ref 3.5–5.1)
Sodium: 138 mmol/L (ref 135–145)
Total Bilirubin: 4.3 mg/dL — ABNORMAL HIGH (ref 0.3–1.2)
Total Protein: 4.4 g/dL — ABNORMAL LOW (ref 6.5–8.1)

## 2019-02-24 LAB — MAGNESIUM: Magnesium: 1.8 mg/dL (ref 1.7–2.4)

## 2019-02-24 LAB — CBC
HCT: 27.9 % — ABNORMAL LOW (ref 39.0–52.0)
Hemoglobin: 9.5 g/dL — ABNORMAL LOW (ref 13.0–17.0)
MCH: 29.9 pg (ref 26.0–34.0)
MCHC: 34.1 g/dL (ref 30.0–36.0)
MCV: 87.7 fL (ref 80.0–100.0)
Platelets: 90 10*3/uL — ABNORMAL LOW (ref 150–400)
RBC: 3.18 MIL/uL — ABNORMAL LOW (ref 4.22–5.81)
RDW: 13.5 % (ref 11.5–15.5)
WBC: 10.1 10*3/uL (ref 4.0–10.5)
nRBC: 0 % (ref 0.0–0.2)

## 2019-02-24 LAB — GLUCOSE, CAPILLARY
Glucose-Capillary: 163 mg/dL — ABNORMAL HIGH (ref 70–99)
Glucose-Capillary: 189 mg/dL — ABNORMAL HIGH (ref 70–99)
Glucose-Capillary: 191 mg/dL — ABNORMAL HIGH (ref 70–99)
Glucose-Capillary: 261 mg/dL — ABNORMAL HIGH (ref 70–99)

## 2019-02-24 LAB — PROTIME-INR
INR: 1.1 (ref 0.8–1.2)
Prothrombin Time: 13.9 seconds (ref 11.4–15.2)

## 2019-02-24 MED ORDER — SODIUM PHOSPHATES 45 MMOLE/15ML IV SOLN
10.0000 mmol | Freq: Once | INTRAVENOUS | Status: AC
  Administered 2019-02-24: 10 mmol via INTRAVENOUS
  Filled 2019-02-24: qty 3.33

## 2019-02-24 MED ORDER — POTASSIUM CHLORIDE 2 MEQ/ML IV SOLN
INTRAVENOUS | Status: DC
  Filled 2019-02-24: qty 1000

## 2019-02-24 MED ORDER — BOOST PLUS PO LIQD
237.0000 mL | Freq: Three times a day (TID) | ORAL | Status: DC
  Administered 2019-02-24 – 2019-02-26 (×7): 237 mL via ORAL
  Filled 2019-02-24 (×8): qty 237

## 2019-02-24 MED ORDER — K PHOS MONO-SOD PHOS DI & MONO 155-852-130 MG PO TABS
500.0000 mg | ORAL_TABLET | Freq: Once | ORAL | Status: AC
  Filled 2019-02-24: qty 2

## 2019-02-24 MED ORDER — LACTULOSE 10 GM/15ML PO SOLN
10.0000 g | Freq: Two times a day (BID) | ORAL | Status: DC
  Administered 2019-02-24 – 2019-02-25 (×3): 10 g via ORAL
  Filled 2019-02-24 (×3): qty 15

## 2019-02-24 MED ORDER — K PHOS MONO-SOD PHOS DI & MONO 155-852-130 MG PO TABS
500.0000 mg | ORAL_TABLET | Freq: Three times a day (TID) | ORAL | Status: DC
  Administered 2019-02-24 (×4): 500 mg via ORAL
  Filled 2019-02-24 (×6): qty 2

## 2019-02-24 MED ORDER — PRO-STAT SUGAR FREE PO LIQD
30.0000 mL | Freq: Three times a day (TID) | ORAL | Status: DC
  Administered 2019-02-24 – 2019-02-26 (×7): 30 mL via ORAL
  Filled 2019-02-24 (×7): qty 30

## 2019-02-24 NOTE — Progress Notes (Signed)
   02/23/19 2157  Charting Type  Charting Type Reassessment  Neurological  Level of Consciousness Alert  Orientation Level Oriented to person;Disoriented to place;Disoriented to time;Disoriented to situation  Cognition Appropriate at baseline;Appropriate attention/concentration  Speech Clear  Neuro Symptoms None    Low blood sugar reported by CNA, blood sugar of 47 noted at 2147.  Patient reassessed, no alterations in mentation noted.  REspirations free and easy, no obvious s/s of distress noted.  Skin dry and intact, no diaphresis noted.  No changes in respiration noted.  No new onset nausea, vomiting or diarrhea.  No seizure activity noted.  Patient offered snack, patient initially refused snack due to new order of nectar-thickened liquids.  IV dextrose adminsitered, per MD hypoglycemic standing order.  Blood sugar re-checked at 2209, improved to 167.  Patient currently up in bed drinking nectar-thickened juice.

## 2019-02-24 NOTE — Progress Notes (Signed)
Lactulose enema administered on 7/19 at 2210. Instilled: 600 mL, retained approximately 550 mL Patient had large bowel movement.

## 2019-02-24 NOTE — Progress Notes (Signed)
Critical phosphorus alert received from lab at 0403. Time collected 243.  MD has been notified of results by charge RN, see orders.

## 2019-02-24 NOTE — Progress Notes (Signed)
CSW notes recommendation for SNF placement. Barriers include lack of insurance and substance use. CSW will continue to follow.  Rodney Locus Horald Birky LCSW [phone]

## 2019-02-24 NOTE — Progress Notes (Addendum)
  Speech Language Pathology Treatment: Dysphagia  Patient Details Name: Rodney Barber MRN: [id number] DOB: 1965/09/12 Today's Date: 02/24/2019 Time: 6294-7654 SLP Time Calculation (min) (ACUTE ONLY): 10 min  Assessment / Plan / Recommendation Clinical Impression  Pt alert and appropriate, still on nectar thick liquids as diet was not changed after SLP visit yesterday. Today pt takes sips of thin liquids and consistently coughs, at times quite significantly. CXR on admission was negative. At this point signs of aspiration concerning enough to pursue objective testing. Pt today is talking to his brother on the phone about leaving the hospital though no d/c plan is in chart. Will plan on MBS if pt is agreeable to participate.    HPI HPI: Patient is a 53 y.o. 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  He was going to be transferred to [facility] liver unit but his liver test improved with treatment at [facility].      SLP Plan  MBS       Recommendations  Diet recommendations: Dysphagia 3 (mechanical soft);Nectar-thick liquid Liquids provided via: Cup Medication Administration: Whole meds with puree Supervision: Patient able to self feed;Staff to assist with self feeding;Intermittent supervision to cue for compensatory strategies Compensations: Minimize environmental distractions;Slow rate;Small sips/bites Postural Changes and/or Swallow Maneuvers: Seated upright 90 degrees                Oral Care Recommendations: Oral care BID Follow up Recommendations: Skilled Nursing facility SLP Visit Diagnosis: Dysphagia, unspecified (R13.10) Plan: MBS       GO               Rodney Baltimore, MA CCC-SLP  Acute Rehabilitation Services Pager [phone] Office [phone]  Rodney Barber 02/24/2019, 12:00 PM

## 2019-02-24 NOTE — Progress Notes (Signed)
[facility]    NEPHROLOGY PROGRESS NOTE  SUBJECTIVE: Patient seen and examined.  Is awake and conversant.  Denies chest pain, shortness of breath, nausea, vomiting, diarrhea or dysuria.  All other review of systems are negative.   OBJECTIVE:  Vitals:   02/24/19 0302 02/24/19 1020  BP: (!) 141/78 (!) 144/82  Pulse:  83  Resp: 19 18  Temp: 98.2 F (36.8 C) 97.6 F (36.4 C)  SpO2:  94%    Intake/Output Summary (Last 24 hours) at 02/24/2019 1532 Last data filed at 02/24/2019 0330 Gross per 24 hour  Intake 1666.54 ml  Output 150 ml  Net 1516.54 ml      General: Alert to self, NAD HEENT: MMM [place] AT anicteric sclera Neck:  No JVD, no adenopathy CV:  Heart RRR  Lungs:  L/S CTA bilaterally Abd:  abd SNT/ND with normal BS GU:  Bladder non-palpable Extremities:  No LE edema. Skin:  No skin rash  MEDICATIONS:  . chlorhexidine  15 mL Mouth Rinse BID  . cloNIDine  0.2 mg Transdermal Q Sat  . feeding supplement (PRO-STAT SUGAR FREE 64)  30 mL Oral TID WC  . folic acid  1 mg Oral Daily  . insulin aspart  0-9 Units Subcutaneous TID WC  . lactose free nutrition  237 mL Oral TID WC  . lactulose  10 g Oral BID  . mouth rinse  15 mL Mouth Rinse q12n4p  . multivitamin with minerals  1 tablet Oral Daily  . nicotine  14 mg Transdermal Daily  . phosphorus  500 mg Oral TID WC & HS  . thiamine  100 mg Oral Daily       LABS:   CBC Latest Ref Rng & Units 02/24/2019 02/23/2019 02/22/2019  WBC 4.0 - 10.5 K/uL 10.1 6.0 5.0  Hemoglobin 13.0 - 17.0 g/dL 9.5(L) 9.6(L) 10.1(L)  Hematocrit 39.0 - 52.0 % 27.9(L) 28.5(L) 30.0(L)  Platelets 150 - 400 K/uL 90(L) 95(L) 91(L)    CMP Latest Ref Rng & Units 02/24/2019 02/23/2019 02/22/2019  Glucose 70 - 99 mg/dL 172(H) 229(H) -  BUN 6 - 20 mg/dL 21(H) 26(H) -  Creatinine 0.61 - 1.24 mg/dL 1.44(H) 1.99(H) -  Sodium 135 - 145 mmol/L 138 142 -  Potassium 3.5 - 5.1 mmol/L 3.8 3.1(L) 2.9(L)  Chloride 98 - 111 mmol/L 112(H) 113(H) -  CO2  22 - 32 mmol/L 19(L) 22 -  Calcium 8.9 - 10.3 mg/dL 7.6(L) 7.4(L) -  Total Protein 6.5 - 8.1 g/dL 4.4(L) 4.2(L) -  Total Bilirubin 0.3 - 1.2 mg/dL 4.3(H) 5.1(H) -  Alkaline Phos 38 - 126 U/L 430(H) 263(H) -  AST 15 - 41 U/L 108(H) 149(H) -  ALT 0 - 44 U/L 547(H) 736(H) -    Lab Results  Component Value Date   CALCIUM 7.6 (L) 02/24/2019   CAION 0.97 (L) 02/18/2019   PHOS 1.0 (LL) 02/24/2019       Component Value Date/Time   COLORURINE AMBER (A) 02/22/2019 1758   APPEARANCEUR CLOUDY (A) 02/22/2019 1758   LABSPEC 1.012 02/22/2019 1758   PHURINE 5.0 02/22/2019 1758   GLUCOSEU 50 (A) 02/22/2019 1758   HGBUR LARGE (A) 02/22/2019 1758   BILIRUBINUR NEGATIVE 02/22/2019 1758   BILIRUBINUR negative 03/15/2018 1049   BILIRUBINUR negative 12/26/2017 [place] 02/22/2019 1758   PROTEINUR 30 (A) 02/22/2019 1758   UROBILINOGEN 0.2 03/15/2018 1049   UROBILINOGEN 0.2 12/08/2010 1613   NITRITE POSITIVE (A) 02/22/2019 1758   LEUKOCYTESUR  LARGE (A) 02/22/2019 1758      Component Value Date/Time   HCO3 23.6 02/18/2019 1959   TCO2 24 02/18/2019 1959   O2SAT 96.0 02/18/2019 1959       Component Value Date/Time   FERRITIN 269 12/03/2018 0348       ASSESSMENT/PLAN:    1.  Baseline serum creatinine around 1.  2.  Acute kidney injury.  Suspect multifactorial, possibly related to some hepatorenal physiology versus bilirubin toxicity versus volume depletion.  Agree with IV fluids as patient is not eating.    Has around 0.6 g of proteinuria. Urine output appears to be excellent, arguing against hepatorenal syndrome and volume depletion.  Also had some component of bladder outlet obstruction.  Also has a history of cocaine and alcohol use, which can certainly cause renal injury.  Renal function is improving.  3.  Acute liver failure.  Improving.  Status post N-acetylcysteine.  4.  Hypokalemia.  Persistent.  Suspect related to shifts and ammonia level.    Also with underlying  renal losses secondary to alcohol use.  Continue potassium supplementation.  Magnesium stable.  5.  Hepatic encephalopathy.  Continue to monitor mental status closely.  Much improved.  6.  DKA.  Improved.  7.  Hypertension.    Clonidine patch increased to 0.2 on 02/22/2019.  BP improved.  8.  Nephrolithiasis.  He has a 19 mm mid to lower left renal calculus causing mild to moderate dilatation of the adjacent collecting system.  Recommend urology evaluation.  Renal function improving.  Will sign off.  Please call with ?'s.  Thanks   [employer], DO, [place]

## 2019-02-24 NOTE — Progress Notes (Signed)
Modified Barium Swallow Progress Note  Patient Details  Name: Rodney Barber MRN: [id number] Date of Birth: [birth year]  Today's Date: 02/24/2019  Modified Barium Swallow completed.  Full report located under Chart Review in the Imaging Section.  Brief recommendations include the following:  Clinical Impression  Pt demonstrates mild oropharyngeal dysphagia primairly due to weakness and decreased laryngeal sensation. Pt is impulsive, takes large rapid sips regardless of verbal instruction. Despite this behavior there was no immediate aspiration. Pt did have decreased base of tongue retraction and epiglottic deflection with vallecular residue. There was trace silent frank penetration after the swallow from this pooled residue. Pt was able to complete a chin tuck with max verbal cueing which reduced residue. He could not be cued to initaite a second swallow even with visual feedback, modeling and verbal instruction. Pt had frequent coughing throughout exam, not associated with penetration or aspiration. Risk of significant aspiration is low, but pt would benefit from f/u therapy to reinforce best swallow behavior and compensatory strategies though carry over may be unlikely. Will attempt therapy trial.    Swallow Evaluation Recommendations       SLP Diet Recommendations: Regular solids;Thin liquid   Liquid Administration via: Cup;Straw   Medication Administration: Whole meds with liquid   Supervision: Patient able to self feed   Compensations: Minimize environmental distractions;Slow rate;Small sips/bites;Chin tuck;Effortful swallow;Multiple dry swallows after each bite/sip   Postural Changes: Remain semi-upright after after feeds/meals (Comment)   Oral Care Recommendations: Oral care BID      [name], MA [place] Pager [phone] Office [phone]   [name], [name] 02/24/2019,1:56 PM

## 2019-02-24 NOTE — Plan of Care (Signed)
  Problem: Clinical Measurements: Goal: Respiratory complications will improve Outcome: Progressing Goal: Cardiovascular complication will be avoided Outcome: Progressing   Problem: Activity: Goal: Risk for activity intolerance will decrease Outcome: Progressing   Problem: Coping: Goal: Level of anxiety will decrease Outcome: Progressing   Problem: Pain Managment: Goal: General experience of comfort will improve Outcome: Progressing   Problem: Safety: Goal: Ability to remain free from injury will improve Outcome: Progressing   Problem: Skin Integrity: Goal: Risk for impaired skin integrity will decrease Outcome: Progressing   Problem: Urinary Elimination: Goal: Ability to achieve and maintain adequate renal perfusion and functioning will improve Outcome: Progressing Note: Patient urinary output remains low, but slowly improving.  Additional time needed to complete goal.   Problem: Clinical Measurements: Goal: Ability to maintain clinical measurements within normal limits will improve Outcome: Not Progressing Note: Patient had episode of low blood sugar, hypoglycemic protocol added during shift.   Problem: Nutrition: Goal: Adequate nutrition will be maintained Outcome: Not Progressing Note: No progress made towards goals; patient angry about thickened liquids order.   Problem: Elimination: Goal: Will not experience complications related to urinary retention Outcome: Not Progressing Note: Patient still has foley catheter in place

## 2019-02-24 NOTE — Progress Notes (Signed)
PROGRESS NOTE                                                                                                                                                                                                             Patient Demographics:    Rodney Barber, is a 53 y.o. male, DOB - [birth year], YQI:[id number]  Admit date - 02/18/2019   Admitting Physician Charlsie Quest, MD  Outpatient Primary MD for the patient is [name], NP  LOS - 6  Chief Complaint  Patient presents with   Hyperglycemia    CBG=526       Brief Narrative  Rodney Barber a 53 y.o.malewith medical history significant foruncontrolled insulin-dependent type 2 diabetes, hypertension, chronic pancreatitis with history of chronic biliary stenosis, tobacco use, alcohol use, and substance use disorder who presents to the ED for evaluation of persistent nausea and vomiting.  Was found to have DKA, extremely elevated LFTs and possible acetaminophen toxicity with fulminant acute liver failure and acute hepatitis and admitted to the hospital.   Subjective:   Patient in bed, appears comfortable, denies any headache, no fever, no chest pain or pressure, no shortness of breath , no abdominal pain. No focal weakness.    Assessment  & Plan :     1.  Acute hepatic and metabolic encephalopathy from DKA along with Acute liver failure, acute hepatitis and acetaminophen toxicity.  He was initially obtunded, he has been treated with IV acetylcysteine under the guidance of poison control, LFTs and INR have improved on 02/22/2019 and Poison control has advised that acetylcysteine be stopped on 02/22/2019 which was done.    With supportive care his encephalopathy has resolved, liver function has improved including INR and synthetic function, LFTs are trending down, he was initially to be transferred to [facility] but now this has been canceled by [facility] since he has improved.  He denies any suicidal ideation says any  Tylenol overdose would be accidental.  Continue supportive care.   2.  Acute liver failure induced coagulopathy.  INR now trending down, had peaked at 4.4.  Continue to monitor.  3.  Severe hypokalemia.  Replaced again IV will continue to monitor.  4.  AKI.  Due to combination of dehydration and bladder outlet obstruction.  Likely has some element of ATN, now improving with hydration, nephrology on board, renal ultrasound shows nonobstructing kidney stones with evidence of some chronic kidney disease, continue hydration for now and monitor renal function, he needs to have  good urine output, no indication for HD.   5.  History of chronic pancreatitis.  Stable lipase.  GI to follow.  Has history of CBD stent.  Will follow with Dr. Ewing Schlein for it.  6.  History of substance abuse.  Uses cocaine and alcohol.  He was counseled to quit both.  No signs of DTs.  Monitor.  On CIWA protocol but for now hold any further Ativan since he is obtunded.  7. Severe PCM - have placed him on pro-stat 3 times daily along with boost 3 times daily.  8.  Hypophosphatemia.  Replaced.    9. DKA DM2 -  ON iSS. Poor outpatient control due to hyperglycemia.  Here oral intake is inconsistent, noted nighttime hypoglycemia hold off Lantus.  Lab Results  Component Value Date   HGBA1C 17.7 (H) 02/18/2019    CBG (last 3)  Recent Labs    02/23/19 2147 02/23/19 2209 02/24/19 0800  GLUCAP 47* 167* 163*     Family Communication  : brother 02/22/19  Code Status :  DNR  Disposition Plan  :  Tele.  Consults  :    Renal  GI - Dr Janeece Agee Poison control   [facility] ? Hepatology, Dr. Huston Foley ? General medicine, Dr. Laurine Blazer  Procedures  :    Renal US -    1. 19 mm mid to lower left renal calculus causing mild to moderate dilatation of the upper pole collecting system. 2. Additional smaller, nonobstructing calculi in the lower pole of the left kidney. 3. 10 mm nonobstructing  right renal calculus. 4. Borderline increased echogenicity of both kidneys, suggesting the possibility of mild medical renal disease. 5. Mild amount of pericholecystic fluid without significant change and minimal ascites.  RUQ [place] - 1. Positive for cholelithiasis, borderline to mild gallbladder wall thickening, borderline to mildly dilated CBD, and trace pericholecystic fluid. No sonographic [name] sign was elicited, and there is no intrahepatic biliary dilatation to strongly suggest acute duct obstruction. However, consider the possibility of choledocholithiasis. MRCP versus Nuclear Medicine Hepatobiliary Scan could be valuable in this clinical setting. 2. Nephrolithiasis. No other acute ultrasound finding in the abdomen.  CT head - Non acute    DVT Prophylaxis  :    SCDs   Lab Results  Component Value Date   PLT 90 (L) 02/24/2019    Diet :  Diet Order            DIET SOFT Room service appropriate? Yes; Fluid consistency: Nectar Thick  Diet effective now               Inpatient Medications Scheduled Meds:  chlorhexidine  15 mL Mouth Rinse BID   cloNIDine  0.2 mg Transdermal Q Sat   dextrose       folic acid  1 mg Oral Daily   insulin aspart  0-9 Units Subcutaneous TID WC   lactulose  300 mL Rectal BID   mouth rinse  15 mL Mouth Rinse q12n4p   multivitamin with minerals  1 tablet Oral Daily   nicotine  14 mg Transdermal Daily   phosphorus  500 mg Oral Once   phosphorus  500 mg Oral TID WC & HS   sodium chloride flush  10-40 mL Intracatheter Q12H   thiamine  100 mg Oral Daily   Continuous Infusions:  dextrose 50 mL/hr at 02/23/19 1823   lactated ringers with kcl 100 mL/hr at 02/24/19 0058   sodium phosphate  Dextrose  5% IVPB 10 mmol (02/24/19 0831)   PRN Meds:.hydrALAZINE, promethazine  Antibiotics  :   Anti-infectives (From admission, onward)   None          Objective:   Vitals:   02/23/19 1300 02/23/19 1600 02/23/19 1943 02/24/19 0302  BP:  134/79 138/80 (!) 144/86 (!) 141/78  Pulse:   75   Resp:   19 19  Temp: (!) 97.4 F (36.3 C)  98 F (36.7 C) 98.2 F (36.8 C)  TempSrc: Oral  Oral Oral  SpO2: 95%  96%   Weight:      Height:        Wt Readings from Last 3 Encounters:  02/22/19 49.3 kg  12/04/18 59 kg  03/15/18 53 kg     Intake/Output Summary (Last 24 hours) at 02/24/2019 0930 Last data filed at 02/24/2019 0330 Gross per 24 hour  Intake 3222.29 ml  Output 400 ml  Net 2822.29 ml     Physical Exam  Extremely weak, cachectic frail African-American male lying in hospital bed in no distress, old right-sided facial bruise, [place].AT,PERRAL Supple Neck,No JVD, No cervical lymphadenopathy appriciated.  Symmetrical Chest wall movement, Good air movement bilaterally, CTAB RRR,No Gallops, Rubs or new Murmurs, No Parasternal Heave +ve B.Sounds, Abd Soft, No tenderness, No organomegaly appriciated, No rebound - guarding or rigidity. No Cyanosis, Clubbing or edema,      Data Review:    CBC Recent Labs  Lab 02/18/19 1945  02/20/19 0504 02/21/19 0511 02/22/19 0433 02/23/19 0341 02/24/19 0243  WBC 11.5*   < > 5.8 4.6 5.0 6.0 10.1  HGB 12.7*   < > 11.7* 10.9* 10.1* 9.6* 9.5*  HCT 37.7*   < > 35.1* 31.8* 30.0* 28.5* 27.9*  PLT 233   < > 110* 98* 91* 95* 90*  MCV 89.5   < > 88.6 87.1 88.8 89.3 87.7  MCH 30.2   < > 29.5 29.9 29.9 30.1 29.9  MCHC 33.7   < > 33.3 34.3 33.7 33.7 34.1  RDW 13.3   < > 13.3 13.2 13.5 13.5 13.5  LYMPHSABS 0.7  --   --   --   --   --   --   MONOABS 0.1  --   --   --   --   --   --   EOSABS 0.0  --   --   --   --   --   --   BASOSABS 0.0  --   --   --   --   --   --    < > = values in this interval not displayed.    Chemistries  Recent Labs  Lab 02/20/19 0808  02/21/19 0511 02/21/19 1653 02/22/19 0433 02/22/19 1806 02/23/19 0341 02/24/19 0243  NA  --    < > 146* 148* 148*  --  142 138  K  --    < > 2.6* 3.5 2.5* 2.9* 3.1* 3.8  CL  --    < > 113* 115* 118*  --  113* 112*    CO2  --    < > 23 23 21*  --  22 19*  GLUCOSE  --    < > 143* 66* 104*  --  229* 172*  BUN  --    < > 34* 34* 32*  --  26* 21*  CREATININE  --    < > 2.38* 2.35* 2.24*  --  1.99* 1.44*  CALCIUM  --    < >  7.8* 7.7* 7.7*  --  7.4* 7.6*  MG 2.4  --  2.0  --  1.7  --  1.7 1.8  AST  --    < > 1,879* 855* 421*  --  149* 108*  ALT  --    < > 2,254* 1,706* 1,244*  --  736* 547*  ALKPHOS  --    < > 193* 216* 250*  --  263* 430*  BILITOT  --    < > 4.5* 5.0* 5.2*  --  5.1* 4.3*   < > = values in this interval not displayed.   ------------------------------------------------------------------------------------------------------------------ No results for input(s): CHOL, HDL, LDLCALC, TRIG, CHOLHDL, LDLDIRECT in the last 72 hours.  Lab Results  Component Value Date   HGBA1C 17.7 (H) 02/18/2019   ------------------------------------------------------------------------------------------------------------------ No results for input(s): TSH, T4TOTAL, T3FREE, THYROIDAB in the last 72 hours.  Invalid input(s): FREET3 ------------------------------------------------------------------------------------------------------------------ No results for input(s): VITAMINB12, FOLATE, FERRITIN, TIBC, IRON, RETICCTPCT in the last 72 hours.  Coagulation profile Recent Labs  Lab 02/21/19 0511 02/21/19 1653 02/22/19 0433 02/23/19 0341 02/24/19 0243  INR 1.9* 1.6* 1.7* 1.4* 1.1    No results for input(s): DDIMER in the last 72 hours.  Cardiac Enzymes No results for input(s): CKMB, TROPONINI, MYOGLOBIN in the last 168 hours.  Invalid input(s): CK ------------------------------------------------------------------------------------------------------------------ No results found for: BNP  Micro Results Recent Results (from the past 240 hour(s))  SARS Coronavirus 2 (CEPHEID - Performed in [facility] hospital lab), Hosp Order     Status: None   Collection Time: 02/18/19 10:38 PM   Specimen:  Nasopharyngeal Swab  Result Value Ref Range Status   SARS Coronavirus 2 NEGATIVE NEGATIVE Final    Comment: (NOTE) If result is NEGATIVE SARS-CoV-2 target nucleic acids are NOT DETECTED. The SARS-CoV-2 RNA is generally detectable in upper and lower  respiratory specimens during the acute phase of infection. The lowest  concentration of SARS-CoV-2 viral copies this assay can detect is 250  copies / mL. A negative result does not preclude SARS-CoV-2 infection  and should not be used as the sole basis for treatment or other  patient management decisions.  A negative result may occur with  improper specimen collection / handling, submission of specimen other  than nasopharyngeal swab, presence of viral mutation(s) within the  areas targeted by this assay, and inadequate number of viral copies  (<250 copies / mL). A negative result must be combined with clinical  observations, patient history, and epidemiological information. If result is POSITIVE SARS-CoV-2 target nucleic acids are DETECTED. The SARS-CoV-2 RNA is generally detectable in upper and lower  respiratory specimens dur ing the acute phase of infection.  Positive  results are indicative of active infection with SARS-CoV-2.  Clinical  correlation with patient history and other diagnostic information is  necessary to determine patient infection status.  Positive results do  not rule out bacterial infection or co-infection with other viruses. If result is PRESUMPTIVE POSTIVE SARS-CoV-2 nucleic acids MAY BE PRESENT.   A presumptive positive result was obtained on the submitted specimen  and confirmed on repeat testing.  While 2019 novel coronavirus  (SARS-CoV-2) nucleic acids may be present in the submitted sample  additional confirmatory testing may be necessary for epidemiological  and / or clinical management purposes  to differentiate between  SARS-CoV-2 and other Sarbecovirus currently known to infect humans.  If clinically  indicated additional testing with an alternate test  methodology [phone]) is advised. The SARS-CoV-2 RNA is generally  detectable  in upper and lower respiratory sp ecimens during the acute  phase of infection. The expected result is Negative. Fact Sheet for Patients:  BoilerBrush.com.cy Fact Sheet for Healthcare Providers: https://pope.com/ This test is not yet approved or cleared by the [place] FDA and has been authorized for detection and/or diagnosis of SARS-CoV-2 by FDA under an Emergency Use Authorization (EUA).  This EUA will remain in effect (meaning this test can be used) for the duration of the COVID-19 declaration under Section 564(b)(1) of the Act, 21 U.S.C. section 360bbb-3(b)(1), unless the authorization is terminated or revoked sooner. Performed at [facility], [address], [place], [place] [address]   MRSA PCR Screening     Status: None   Collection Time: 02/19/19  2:50 AM   Specimen: Nasal Mucosa; Nasopharyngeal  Result Value Ref Range Status   MRSA by PCR NEGATIVE NEGATIVE Final    Comment:        The GeneXpert MRSA Assay (FDA approved for NASAL specimens only), is one component of a comprehensive MRSA colonization surveillance program. It is not intended to diagnose MRSA infection nor to guide or monitor treatment for MRSA infections. Performed at [facility], [address] [place], [place] [address]     Radiology Reports Ct Head Wo Contrast  Result Date: 02/21/2019 CLINICAL DATA:  Altered mental status EXAM: CT HEAD WITHOUT CONTRAST TECHNIQUE: Contiguous axial images were obtained from the base of the skull through the vertex without intravenous contrast. COMPARISON:  December 02, 2018 FINDINGS: Brain: Ventricles and sulci are within normal limits. There is no intracranial mass, hemorrhage, extra-axial fluid collection, or midline shift. Brain parenchyma appears unremarkable. No acute  infarct evident. Vascular: There is no hyperdense vessel. There is calcification in each carotid siphon region. Skull: Bony calvarium appears intact. Sinuses/Orbits: There are foci of opacification in each maxillary antrum, more notable on the left than on the right. There is mucosal thickening in several ethmoid air cells. Orbits appear symmetric bilaterally. Other: There is a small air-fluid level in a posteroinferior mastoid air cell on the left. Mastoids elsewhere are clear. IMPRESSION: Brain parenchyma appears unremarkable.  No mass or hemorrhage. There are foci of arterial vascular calcification. There are areas of paranasal sinus disease. A small air-fluid level is noted in inferior posterior left mastoid air cell. Other mastoids are clear. Electronically Signed   By: Bretta Bang III M.D.   On: 02/21/2019 11:03   US Abdomen Complete  Result Date: 02/19/2019 CLINICAL DATA:  53 year old male with abnormal LFTs. EXAM: ABDOMEN ULTRASOUND COMPLETE COMPARISON:  Abdomen ultrasound 12/02/2018. CT Abdomen and Pelvis 07/10/2013. FINDINGS: Gallbladder: A shadowing echogenic stone is identified on image 5, about 6 millimeters. Gallbladder wall thickness is at the upper limits of normal to mildly increased. There is trace pericholecystic fluid on image 14. No sonographic Murphy sign elicited. Decreased sludge compared to the prior ultrasound. Common bile duct: Diameter: 7 millimeters, increased since April and mildly abnormal. No filling defect identified within the visible duct. Liver: No intrahepatic biliary ductal dilatation. Liver echogenicity within normal limits. No discrete liver lesion identified. Portal vein is patent on color Doppler imaging with normal direction of blood flow towards the liver. IVC: No abnormality visualized. Pancreas: Not well visualized. Spleen: Not well visualized. Right Kidney: Length: 10.1 centimeters. No hydronephrosis. Renal echogenicity within normal limits. Shadowing lower  pole calculus estimated at 7-8 millimeters (image 82). Left Kidney: Length: 10.4 centimeters. Left renal echogenicity within normal limits. 1 large versus multiple shadowing  calculi near the renal hilum (image 95). No left hydronephrosis. A lower pole calculus estimated at 9 millimeters is identified on image 103. Abdominal aorta: Negative for aneurysm. Positive for irregularity compatible with atherosclerosis. Other findings: Fluid distended stomach.  No free fluid. IMPRESSION: 1. Positive for cholelithiasis, borderline to mild gallbladder wall thickening, borderline to mildly dilated CBD, and trace pericholecystic fluid. No sonographic [name] sign was elicited, and there is no intrahepatic biliary dilatation to strongly suggest acute duct obstruction. However, consider the possibility of choledocholithiasis. MRCP versus Nuclear Medicine Hepatobiliary Scan could be valuable in this clinical setting. 2. Nephrolithiasis. No other acute ultrasound finding in the abdomen. Electronically Signed   By: Odessa Fleming M.D.   On: 02/19/2019 01:03   US Renal  Result Date: 02/22/2019 CLINICAL DATA:  Acute renal failure.  Diabetes.  Hypertension. EXAM: RENAL / URINARY TRACT ULTRASOUND COMPLETE COMPARISON:  Abdomen ultrasound dated 02/19/2019. FINDINGS: Right Kidney: Renal measurements: 10.4 x 6.0 x 5.6 cm = volume: 181 mL. 10 mm shadowing calculus in the lower pole. Borderline echogenic. No hydronephrosis. Left Kidney: Renal measurements: 10.7 x 6.0 x 6.0 cm = volume: 200 mL. 19 mm shadowing calculus in the central portion of the kidney with mild to moderate dilatation of the adjacent portion of the collecting system superiorly. There are also adjacent calculi in the lower pole of the left kidney, spanning 8 mm. Borderline echogenic. Bladder: Empty with a Foley catheter in place. Other: Mild pericholecystic fluid, similar to the recent abdomen ultrasound. Minimal ascites. IMPRESSION: 1. 19 mm mid to lower left renal calculus  causing mild to moderate dilatation of the upper pole collecting system. 2. Additional smaller, nonobstructing calculi in the lower pole of the left kidney. 3. 10 mm nonobstructing right renal calculus. 4. Borderline increased echogenicity of both kidneys, suggesting the possibility of mild medical renal disease. 5. Mild amount of pericholecystic fluid without significant change and minimal ascites. Electronically Signed   By: Beckie Salts M.D.   On: 02/22/2019 15:22   Dg Chest Port 1 View  Result Date: 02/19/2019 CLINICAL DATA:  Cough EXAM: PORTABLE CHEST 1 VIEW COMPARISON:  12/02/2018 FINDINGS: Heart and mediastinal contours are within normal limits. No focal opacities or effusions. No acute bony abnormality. IMPRESSION: No active disease. Electronically Signed   By: Charlett Nose M.D.   On: 02/19/2019 17:42    Time Spent in minutes  30   Susa Raring M.D on 02/24/2019 at 9:30 AM  To page go to [email or web address] - password [email or web address]

## 2019-02-25 LAB — AMMONIA: Ammonia: 40 umol/L — ABNORMAL HIGH (ref 9–35)

## 2019-02-25 LAB — GLUCOSE, CAPILLARY
Glucose-Capillary: 202 mg/dL — ABNORMAL HIGH (ref 70–99)
Glucose-Capillary: 283 mg/dL — ABNORMAL HIGH (ref 70–99)
Glucose-Capillary: 339 mg/dL — ABNORMAL HIGH (ref 70–99)
Glucose-Capillary: 315 mg/dL — ABNORMAL HIGH (ref 70–99)
Glucose-Capillary: 221 mg/dL — ABNORMAL HIGH (ref 70–99)

## 2019-02-25 LAB — PHOSPHORUS: Phosphorus: 3.4 mg/dL (ref 2.5–4.6)

## 2019-02-25 LAB — COMPREHENSIVE METABOLIC PANEL
ALT: 359 U/L — ABNORMAL HIGH (ref 0–44)
AST: 74 U/L — ABNORMAL HIGH (ref 15–41)
Albumin: 1.9 g/dL — ABNORMAL LOW (ref 3.5–5.0)
Alkaline Phosphatase: 528 U/L — ABNORMAL HIGH (ref 38–126)
Anion gap: 6 (ref 5–15)
BUN: 21 mg/dL — ABNORMAL HIGH (ref 6–20)
CO2: 23 mmol/L (ref 22–32)
Calcium: 7.4 mg/dL — ABNORMAL LOW (ref 8.9–10.3)
Chloride: 111 mmol/L (ref 98–111)
Creatinine, Ser: 1.55 mg/dL — ABNORMAL HIGH (ref 0.61–1.24)
GFR calc Af Amer: 58 mL/min — ABNORMAL LOW (ref >60)
GFR calc non Af Amer: 50 mL/min — ABNORMAL LOW (ref >60)
Glucose, Bld: 225 mg/dL — ABNORMAL HIGH (ref 70–99)
Potassium: 3.2 mmol/L — ABNORMAL LOW (ref 3.5–5.1)
Sodium: 140 mmol/L (ref 135–145)
Total Bilirubin: 2.8 mg/dL — ABNORMAL HIGH (ref 0.3–1.2)
Total Protein: 4.5 g/dL — ABNORMAL LOW (ref 6.5–8.1)

## 2019-02-25 LAB — CBC
HCT: 27 % — ABNORMAL LOW (ref 39.0–52.0)
Hemoglobin: 9 g/dL — ABNORMAL LOW (ref 13.0–17.0)
MCH: 29.8 pg (ref 26.0–34.0)
MCHC: 33.3 g/dL (ref 30.0–36.0)
MCV: 89.4 fL (ref 80.0–100.0)
Platelets: 101 10*3/uL — ABNORMAL LOW (ref 150–400)
RBC: 3.02 MIL/uL — ABNORMAL LOW (ref 4.22–5.81)
RDW: 13.7 % (ref 11.5–15.5)
WBC: 10.3 10*3/uL (ref 4.0–10.5)
nRBC: 0 % (ref 0.0–0.2)

## 2019-02-25 LAB — PROTIME-INR
INR: 1.2 (ref 0.8–1.2)
Prothrombin Time: 14.6 seconds (ref 11.4–15.2)

## 2019-02-25 LAB — MAGNESIUM: Magnesium: 1.7 mg/dL (ref 1.7–2.4)

## 2019-02-25 MED ORDER — POTASSIUM CHLORIDE CRYS ER 20 MEQ PO TBCR
40.0000 meq | EXTENDED_RELEASE_TABLET | Freq: Two times a day (BID) | ORAL | Status: AC
  Administered 2019-02-25 (×2): 40 meq via ORAL
  Filled 2019-02-25 (×2): qty 2

## 2019-02-25 MED ORDER — INSULIN GLARGINE 100 UNIT/ML ~~LOC~~ SOLN
12.0000 [IU] | Freq: Every day | SUBCUTANEOUS | Status: DC
  Administered 2019-02-25 – 2019-02-26 (×2): 12 [IU] via SUBCUTANEOUS
  Filled 2019-02-25 (×2): qty 0.12

## 2019-02-25 MED ORDER — LACTULOSE 10 GM/15ML PO SOLN
20.0000 g | Freq: Two times a day (BID) | ORAL | Status: DC
  Administered 2019-02-25 – 2019-03-06 (×17): 20 g via ORAL
  Filled 2019-02-25 (×18): qty 30

## 2019-02-25 NOTE — Progress Notes (Signed)
Speech Language Pathology Treatment: Dysphagia  Patient Details Name: Rodney Barber MRN: [id number] DOB: [birth year] Today's Date: 02/25/2019 Time: 7425-9563 SLP Time Calculation (min) (ACUTE ONLY): 8 min  Assessment / Plan / Recommendation Clinical Impression  Pt able to return demonstrates chin tuck and multiple swallow with max fading to min verbal cues. Discussed rationale. Pt receptive, but not expected to recall strategies yet. Will f/u for further training. Tolerating diet at this time.   HPI HPI: Patient is a 53 y.o. 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  He was going to be transferred to [facility] liver unit but his liver test improved with treatment at [facility].      SLP Plan  Continue with current plan of care       Recommendations  Diet recommendations: Thin liquid;Regular Liquids provided via: Cup Medication Administration: Whole meds with puree Supervision: Patient able to self feed;Staff to assist with self feeding;Intermittent supervision to cue for compensatory strategies Compensations: Minimize environmental distractions;Slow rate;Small sips/bites;Chin tuck;Effortful swallow;Multiple dry swallows after each bite/sip Postural Changes and/or Swallow Maneuvers: Seated upright 90 degrees                Oral Care Recommendations: Oral care BID Follow up Recommendations: Skilled Nursing facility SLP Visit Diagnosis: Dysphagia, unspecified (R13.10) Plan: Continue with current plan of care       GO                Martino Tompson, Riley Nearing 02/25/2019, 1:53 PM

## 2019-02-25 NOTE — Progress Notes (Addendum)
PROGRESS NOTE                                                                                                                                                                                                             Patient Demographics:    Rodney Barber, is a 53 y.o. male, DOB - 1966-05-21, ZOX:[id number]  Admit date - 02/18/2019   Admitting Physician Charlsie QuestVishal R Patel, MD  Outpatient Primary MD for the patient is Claiborne RiggFleming, Zelda W, NP  LOS - 7  Chief Complaint  Patient presents with   Hyperglycemia    CBG=526       Brief Narrative  Rodney HuaDavid Richardsonis a 53 y.o.malewith medical history significant foruncontrolled insulin-dependent type 2 diabetes, hypertension, chronic pancreatitis with history of chronic biliary stenosis, tobacco use, alcohol use, and substance use disorder who presents to the ED for evaluation of persistent nausea and vomiting.  Was found to have DKA, extremely elevated LFTs and possible acetaminophen toxicity with fulminant acute liver failure and acute hepatitis and admitted to the hospital.   Subjective:   Patient in bed, appears comfortable, denies any headache, no fever, no chest pain or pressure, no shortness of breath , no abdominal pain. No focal weakness.   Assessment  & Plan :     1.  Acute hepatic and metabolic encephalopathy from DKA along with Acute liver failure, acute hepatitis and acetaminophen toxicity.  He was initially obtunded, he has been treated with IV acetylcysteine under the guidance of poison control, LFTs and INR have improved on 02/22/2019 and Poison control has advised that acetylcysteine be stopped on 02/22/2019 which was done.    With supportive care his encephalopathy has resolved, liver function has improved including INR and synthetic function, LFTs are trending down, [facility] now [facility] has been [facility] by [facility] since he has improved.  He denies any suicidal ideation says any  Tylenol overdose would be accidental.  Continue supportive care.  Addendum - patient told the RN, he might have tried to kill himself with OD, Tele sitter, psych consult.   2.  Acute liver failure induced coagulopathy.  INR now trending down, had peaked at 4.4.  Continue to monitor.  Ammonia slightly on the uptrend hence will increase lactulose dose, mentation is clear.  3.  Severe hypokalemia.  Replaced and improved, will give another dose on 02/25/2019.  4.  AKI.  Due to combination of dehydration and bladder outlet obstruction.  Likely has some  element of ATN, now improving with hydration, nephrology on board, renal ultrasound shows nonobstructing kidney stones with evidence of some chronic kidney disease, continue hydration for now and monitor renal function, he needs to have good urine output, no indication for HD.   5.  History of chronic pancreatitis.  Stable lipase.  GI to follow.  Has history of CBD stent.  Will follow with Dr. Ewing Schlein for it.  6.  History of substance abuse.  Uses cocaine and alcohol.  He was counseled to quit both.  No signs of DTs.  Monitor.  On CIWA protocol .  7. Severe PCM - have placed him on pro-stat 3 times daily along with boost 3 times daily.  8.  Hypophosphatemia.  Replaced and stable.    9. DKA in DM2 -  ON iSS. Poor outpatient control due to hyperglycemia.  Oral intake has improved hence will add low-dose Lantus.  Lab Results  Component Value Date   HGBA1C 17.7 (H) 02/18/2019    CBG (last 3)  Recent Labs    02/24/19 2340 02/25/19 0412 02/25/19 0803  GLUCAP 191* 202* 283*     Family Communication  : brother 02/22/19  Code Status :  DNR  Disposition Plan  :  Tele.  Consults  :    Psych  Renal  GI - Dr Janeece Agee Poison control   [facility] ? Hepatology, Dr. Huston Foley ? General medicine, Dr. Laurine Blazer  Procedures  :    Renal US -    1. 19 mm mid to lower left renal calculus causing mild to moderate  dilatation of the upper pole collecting system. 2. Additional smaller, nonobstructing calculi in the lower pole of the left kidney. 3. 10 mm nonobstructing right renal calculus. 4. Borderline increased echogenicity of both kidneys, suggesting the possibility of mild medical renal disease. 5. Mild amount of pericholecystic fluid without significant change and minimal ascites.  RUQ [place] - 1. Positive for cholelithiasis, borderline to mild gallbladder wall thickening, borderline to mildly dilated CBD, and trace pericholecystic fluid. No sonographic [name] sign was elicited, and there is no intrahepatic biliary dilatation to strongly suggest acute duct obstruction. However, consider the possibility of choledocholithiasis. MRCP versus Nuclear Medicine Hepatobiliary Scan could be valuable in this clinical setting. 2. Nephrolithiasis. No other acute ultrasound finding in the abdomen.  CT head - Non acute    DVT Prophylaxis  :    SCDs   Lab Results  Component Value Date   PLT 101 (L) 02/25/2019    Diet :  Diet Order            Diet regular Room service appropriate? No; Fluid consistency: Thin  Diet effective now               Inpatient Medications Scheduled Meds:  chlorhexidine  15 mL Mouth Rinse BID   cloNIDine  0.2 mg Transdermal Q Sat   feeding supplement (PRO-STAT SUGAR FREE 64)  30 mL Oral TID WC   folic acid  1 mg Oral Daily   insulin aspart  0-9 Units Subcutaneous TID WC   lactose free nutrition  237 mL Oral TID WC   lactulose  10 g Oral BID   mouth rinse  15 mL Mouth Rinse q12n4p   multivitamin with minerals  1 tablet Oral Daily   nicotine  14 mg Transdermal Daily   potassium chloride  40 mEq Oral BID WC   thiamine  100 mg Oral Daily  Continuous Infusions:  PRN Meds:.hydrALAZINE, promethazine  Antibiotics  :   Anti-infectives (From admission, onward)   None          Objective:   Vitals:   02/25/19 0031 02/25/19 0147 02/25/19 0547 02/25/19 0800    BP:  (!) 143/80 128/70 128/70  Pulse:    80  Resp:  19 (!) 23   Temp: 97.7 F (36.5 C)     TempSrc: Oral     SpO2:      Weight:      Height:        Wt Readings from Last 3 Encounters:  02/22/19 49.3 kg  12/04/18 59 kg  03/15/18 53 kg     Intake/Output Summary (Last 24 hours) at 02/25/2019 1003 Last data filed at 02/25/2019 0650 Gross per 24 hour  Intake 150 ml  Output 725 ml  Net -575 ml     Physical Exam  Extremely weak, cachectic frail African-American male lying in hospital bed in no distress, old right-sided facial bruise, [place].AT,PERRAL Supple Neck,No JVD, No cervical lymphadenopathy appriciated.  Symmetrical Chest wall movement, Good air movement bilaterally, CTAB RRR,No Gallops, Rubs or new Murmurs, No Parasternal Heave +ve B.Sounds, Abd Soft, No tenderness, No organomegaly appriciated, No rebound - guarding or rigidity. No Cyanosis, Clubbing or edema, No new Rash or bruise       Data Review:    CBC Recent Labs  Lab 02/18/19 1945  02/21/19 0511 02/22/19 0433 02/23/19 0341 02/24/19 0243 02/25/19 0435  WBC 11.5*   < > 4.6 5.0 6.0 10.1 10.3  HGB 12.7*   < > 10.9* 10.1* 9.6* 9.5* 9.0*  HCT 37.7*   < > 31.8* 30.0* 28.5* 27.9* 27.0*  PLT 233   < > 98* 91* 95* 90* 101*  MCV 89.5   < > 87.1 88.8 89.3 87.7 89.4  MCH 30.2   < > 29.9 29.9 30.1 29.9 29.8  MCHC 33.7   < > 34.3 33.7 33.7 34.1 33.3  RDW 13.3   < > 13.2 13.5 13.5 13.5 13.7  LYMPHSABS 0.7  --   --   --   --   --   --   MONOABS 0.1  --   --   --   --   --   --   EOSABS 0.0  --   --   --   --   --   --   BASOSABS 0.0  --   --   --   --   --   --    < > = values in this interval not displayed.    Chemistries  Recent Labs  Lab 02/21/19 0511 02/21/19 1653 02/22/19 0433 02/22/19 1806 02/23/19 0341 02/24/19 0243 02/25/19 0435  NA 146* 148* 148*  --  142 138 140  K 2.6* 3.5 2.5* 2.9* 3.1* 3.8 3.2*  CL 113* 115* 118*  --  113* 112* 111  CO2 23 23 21*  --  22 19* 23  GLUCOSE 143* 66* 104*  --   229* 172* 225*  BUN 34* 34* 32*  --  26* 21* 21*  CREATININE 2.38* 2.35* 2.24*  --  1.99* 1.44* 1.55*  CALCIUM 7.8* 7.7* 7.7*  --  7.4* 7.6* 7.4*  MG 2.0  --  1.7  --  1.7 1.8 1.7  AST 1,879* 855* 421*  --  149* 108* 74*  ALT 2,254* 1,706* 1,244*  --  736* 547* 359*  ALKPHOS 193* 216* 250*  --  263* 430* 528*  BILITOT 4.5* 5.0* 5.2*  --  5.1* 4.3* 2.8*   ------------------------------------------------------------------------------------------------------------------ No results for input(s): CHOL, HDL, LDLCALC, TRIG, CHOLHDL, LDLDIRECT in the last 72 hours.  Lab Results  Component Value Date   HGBA1C 17.7 (H) 02/18/2019   ------------------------------------------------------------------------------------------------------------------ No results for input(s): TSH, T4TOTAL, T3FREE, THYROIDAB in the last 72 hours.  Invalid input(s): FREET3 ------------------------------------------------------------------------------------------------------------------ No results for input(s): VITAMINB12, FOLATE, FERRITIN, TIBC, IRON, RETICCTPCT in the last 72 hours.  Coagulation profile Recent Labs  Lab 02/21/19 1653 02/22/19 0433 02/23/19 0341 02/24/19 0243 02/25/19 0435  INR 1.6* 1.7* 1.4* 1.1 1.2    No results for input(s): DDIMER in the last 72 hours.  Cardiac Enzymes No results for input(s): CKMB, TROPONINI, MYOGLOBIN in the last 168 hours.  Invalid input(s): CK ------------------------------------------------------------------------------------------------------------------ No results found for: BNP  Micro Results Recent Results (from the past 240 hour(s))  SARS Coronavirus 2 (CEPHEID - Performed [facility]), Hosp Order     Status: None   Collection Time: 02/18/19 10:38 PM   Specimen: Nasopharyngeal Swab  Result Value Ref Range Status   SARS Coronavirus 2 NEGATIVE NEGATIVE Final    Comment: (NOTE) If result is NEGATIVE SARS-CoV-2 target nucleic acids are NOT  DETECTED. The SARS-CoV-2 RNA is generally detectable in upper and lower  respiratory specimens during the acute phase of infection. The lowest  concentration of SARS-CoV-2 viral copies this assay can detect is 250  copies / mL. A negative result does not preclude SARS-CoV-2 infection  and should not be used as the sole basis for treatment or other  patient management decisions.  A negative result may occur with  improper specimen collection / handling, submission of specimen other  than nasopharyngeal swab, presence of viral mutation(s) within the  areas targeted by this assay, and inadequate number of viral copies  (<250 copies / mL). A negative result must be combined with clinical  observations, patient history, and epidemiological information. If result is POSITIVE SARS-CoV-2 target nucleic acids are DETECTED. The SARS-CoV-2 RNA is generally detectable in upper and lower  respiratory specimens dur ing the acute phase of infection.  Positive  results are indicative of active infection with SARS-CoV-2.  Clinical  correlation with patient history and other diagnostic information is  necessary to determine patient infection status.  Positive results do  not rule out bacterial infection or co-infection with other viruses. If result is PRESUMPTIVE POSTIVE SARS-CoV-2 nucleic acids MAY BE PRESENT.   A presumptive positive result was obtained on the submitted specimen  and confirmed on repeat testing.  While 2019 novel coronavirus  (SARS-CoV-2) nucleic acids may be present in the submitted sample  additional confirmatory testing may be necessary for epidemiological  and / or clinical management purposes  to differentiate between  SARS-CoV-2 and other Sarbecovirus currently known to infect humans.  If clinically indicated additional testing with an alternate test  methodology [phone]([id number]) is advised. The SARS-CoV-2 RNA is generally  detectable in upper and lower respiratory sp ecimens during  the acute  phase of infection. The expected result is Negative. Fact Sheet for Patients:  BoilerBrush.com.cyhttps://www.fda.gov/media/136312/download Fact Sheet for Healthcare Providers: https://pope.com/https://www.fda.gov/media/136313/download This test is not yet approved or cleared by the [place] FDA and has been authorized for detection and/or diagnosis of SARS-CoV-2 by FDA under an Emergency Use Authorization (EUA).  This EUA will remain in effect (meaning this test can be used) for the duration of the COVID-19 declaration under Section 564(b)(1) of the Act, 21  U.S.C. section 360bbb-3(b)(1), unless the authorization is terminated or revoked sooner. Performed at [facility], [place] [address], [place], [place] [address]   MRSA PCR Screening     Status: None   Collection Time: 02/19/19  2:50 AM   Specimen: Nasal Mucosa; Nasopharyngeal  Result Value Ref Range Status   MRSA by PCR NEGATIVE NEGATIVE Final    Comment:        The GeneXpert MRSA Assay (FDA approved for NASAL specimens only), is one component of a comprehensive MRSA colonization surveillance program. It is not intended to diagnose MRSA infection nor to guide or monitor treatment for MRSA infections. Performed at [facility], [place] [address] [place], [place] [address]     Radiology Reports Ct Head Wo Contrast  Result Date: 02/21/2019 CLINICAL DATA:  Altered mental status EXAM: CT HEAD WITHOUT CONTRAST TECHNIQUE: Contiguous axial images were obtained from the base of the skull through the vertex without intravenous contrast. COMPARISON:  December 02, 2018 FINDINGS: Brain: Ventricles and sulci are within normal limits. There is no intracranial mass, hemorrhage, extra-axial fluid collection, or midline shift. Brain parenchyma appears unremarkable. No acute infarct evident. Vascular: There is no hyperdense vessel. There is calcification in each carotid siphon region. Skull: Bony calvarium appears intact. Sinuses/Orbits: There are foci of  opacification in each maxillary antrum, more notable on the left than on the right. There is mucosal thickening in several ethmoid air cells. Orbits appear symmetric bilaterally. Other: There is a small air-fluid level in a posteroinferior mastoid air cell on the left. Mastoids elsewhere are clear. IMPRESSION: Brain parenchyma appears unremarkable.  No mass or hemorrhage. There are foci of arterial vascular calcification. There are areas of paranasal sinus disease. A small air-fluid level is noted in inferior posterior left mastoid air cell. Other mastoids are clear. Electronically Signed   By: Lowella Grip III M.D.   On: 02/21/2019 11:03   US Abdomen Complete  Result Date: 02/19/2019 CLINICAL DATA:  53 year old male with abnormal LFTs. EXAM: ABDOMEN ULTRASOUND COMPLETE COMPARISON:  Abdomen ultrasound 12/02/2018. CT Abdomen and Pelvis 07/10/2013. FINDINGS: Gallbladder: A shadowing echogenic stone is identified on image 5, about 6 millimeters. Gallbladder wall thickness is at the upper limits of normal to mildly increased. There is trace pericholecystic fluid on image 14. No sonographic Murphy sign elicited. Decreased sludge compared to the prior ultrasound. Common bile duct: Diameter: 7 millimeters, increased since April and mildly abnormal. No filling defect identified within the visible duct. Liver: No intrahepatic biliary ductal dilatation. Liver echogenicity within normal limits. No discrete liver lesion identified. Portal vein is patent on color Doppler imaging with normal direction of blood flow towards the liver. IVC: No abnormality visualized. Pancreas: Not well visualized. Spleen: Not well visualized. Right Kidney: Length: 10.1 centimeters. No hydronephrosis. Renal echogenicity within normal limits. Shadowing lower pole calculus estimated at 7-8 millimeters (image 82). Left Kidney: Length: 10.4 centimeters. Left renal echogenicity within normal limits. 1 large versus multiple shadowing calculi near  the renal hilum (image 95). No left hydronephrosis. A lower pole calculus estimated at 9 millimeters is identified on image 103. Abdominal aorta: Negative for aneurysm. Positive for irregularity compatible with atherosclerosis. Other findings: Fluid distended stomach.  No free fluid. IMPRESSION: 1. Positive for cholelithiasis, borderline to mild gallbladder wall thickening, borderline to mildly dilated CBD, and trace pericholecystic fluid. No sonographic Percell Miller sign was elicited, and there is no intrahepatic biliary dilatation to strongly suggest acute duct obstruction. However, consider the possibility of choledocholithiasis. MRCP  versus Nuclear Medicine Hepatobiliary Scan could be valuable in this clinical setting. 2. Nephrolithiasis. No other acute ultrasound finding in the abdomen. Electronically Signed   By: Odessa Fleming M.D.   On: 02/19/2019 01:03   US Renal  Result Date: 02/22/2019 CLINICAL DATA:  Acute renal failure.  Diabetes.  Hypertension. EXAM: RENAL / URINARY TRACT ULTRASOUND COMPLETE COMPARISON:  Abdomen ultrasound dated 02/19/2019. FINDINGS: Right Kidney: Renal measurements: 10.4 x 6.0 x 5.6 cm = volume: 181 mL. 10 mm shadowing calculus in the lower pole. Borderline echogenic. No hydronephrosis. Left Kidney: Renal measurements: 10.7 x 6.0 x 6.0 cm = volume: 200 mL. 19 mm shadowing calculus in the central portion of the kidney with mild to moderate dilatation of the adjacent portion of the collecting system superiorly. There are also adjacent calculi in the lower pole of the left kidney, spanning 8 mm. Borderline echogenic. Bladder: Empty with a Foley catheter in place. Other: Mild pericholecystic fluid, similar to the recent abdomen ultrasound. Minimal ascites. IMPRESSION: 1. 19 mm mid to lower left renal calculus causing mild to moderate dilatation of the upper pole collecting system. 2. Additional smaller, nonobstructing calculi in the lower pole of the left kidney. 3. 10 mm nonobstructing right  renal calculus. 4. Borderline increased echogenicity of both kidneys, suggesting the possibility of mild medical renal disease. 5. Mild amount of pericholecystic fluid without significant change and minimal ascites. Electronically Signed   By: Beckie Salts M.D.   On: 02/22/2019 15:22   Dg Chest Port 1 View  Result Date: 02/19/2019 CLINICAL DATA:  Cough EXAM: PORTABLE CHEST 1 VIEW COMPARISON:  12/02/2018 FINDINGS: Heart and mediastinal contours are within normal limits. No focal opacities or effusions. No acute bony abnormality. IMPRESSION: No active disease. Electronically Signed   By: Charlett Nose M.D.   On: 02/19/2019 17:42    Time Spent in minutes  30   Susa Raring M.D on 02/25/2019 at 10:03 AM  To page go to [email or web address] - password [facility]

## 2019-02-25 NOTE — Progress Notes (Signed)
Occupational Therapy Evaluation Patient Details Name: Rodney MayaDavid Lupien MRN: [id number] DOB: 03/13/1966 Today's Date: 02/25/2019    History of Present Illness Rodney Barber is a 53 y.o. male with medical history significant for uncontrolled insulin-dependent type 2 diabetes, hypertension, chronic pancreatitis with history of chronic biliary stenosis, tobacco use, alcohol use, and substance use disorder who presents to the ED for evaluation of persistent nausea and vomiting.   Clinical Impression   PTA, pt was living at home alone, and reports he was independent with ADL/IADL and functional mobility. Pt currently requires minguard-minA with all ADL and functional mobility at RW level. During in room mobility pt had 3x LOB requiring physical assistance to stabilize and required max vc for safe use of RW. Due to physical and cognitive limitations, pt is at an increased safety risk. Due to decline in current level of function, pt would benefit from acute OT to address established goals to facilitate safe D/C to venue listed below. At this time, recommend SNF follow-up. Will continue to follow acutely.     Follow Up Recommendations  SNF;Supervision/Assistance - 24 hour    Equipment Recommendations  3 in 1 bedside commode    Recommendations for Other Services PT consult     Precautions / Restrictions Precautions Precautions: Fall Precaution Comments: lethargic Restrictions Weight Bearing Restrictions: No      Mobility Bed Mobility Overal bed mobility: Needs Assistance Bed Mobility: Supine to Sit     Supine to sit: Min guard     General bed mobility comments: minguard for safety   Transfers Overall transfer level: Needs assistance Equipment used: 1 person hand held assist;Rolling walker (2 wheeled) Transfers: Sit to/from Stand Sit to Stand: Min guard;Min assist         General transfer comment: pt unsteady, minguard-minA for stability;    Balance Overall balance  assessment: Needs assistance Sitting-balance support: Feet supported;No upper extremity supported Sitting balance-Leahy Scale: Fair Sitting balance - Comments: pt donned socks sitting EOB with minguard for stability    Standing balance support: Single extremity supported;Bilateral upper extremity supported Standing balance-Leahy Scale: Poor Standing balance comment: dependent on physical assist                           ADL either performed or assessed with clinical judgement   ADL Overall ADL's : Needs assistance/impaired Eating/Feeding: Set up;Sitting   Grooming: Min guard;Standing   Upper Body Bathing: Min guard;Sitting   Lower Body Bathing: Min guard;Sit to/from stand   Upper Body Dressing : Min guard;Sitting   Lower Body Dressing: Min guard;Sit to/from stand   Toilet Transfer: Minimal assistance;RW;Ambulation   Toileting- Clothing Manipulation and Hygiene: Minimal assistance;Sit to/from stand       Functional mobility during ADLs: Min guard;Minimal assistance;Rolling walker General ADL Comments: pt's rectal pouch dysfunction during session, required posterior pericare, RN aware and present to remove rectal pouch, elected to leave pouch off. Pt required BUE support on RW while standing;pt ambulated in room with minguard-minA 3x minor LOB required minA to stabilize     Vision         Perception     Praxis      Pertinent Vitals/Pain Pain Assessment: No/denies pain Pain Intervention(s): Monitored during session     Hand Dominance Left   Extremity/Trunk Assessment Upper Extremity Assessment Upper Extremity Assessment: Generalized weakness(bilateral hand edam noted;educated pt on edema management)   Lower Extremity Assessment Lower Extremity Assessment: Defer to PT evaluation   Cervical /  Trunk Assessment Cervical / Trunk Assessment: Normal   Communication Communication Communication: No difficulties(lethargic with slurred speech)   Cognition  Arousal/Alertness: Lethargic Behavior During Therapy: Flat affect Overall Cognitive Status: Impaired/Different from baseline(however unsure what baseline is like) Area of Impairment: Orientation;Attention;Memory;Following commands;Safety/judgement;Awareness;Problem solving                 Orientation Level: Disoriented to;Time;Situation(stated hospital) Current Attention Level: Focused Memory: Decreased short-term memory Following Commands: Follows one step commands inconsistently;Follows one step commands with increased time Safety/Judgement: Decreased awareness of safety;Decreased awareness of deficits Awareness: Intellectual Problem Solving: Slow processing;Decreased initiation;Difficulty sequencing;Requires verbal cues;Requires tactile cues General Comments: pt very impulsive;requires consistent vc for safe [place] of DME and safe hand placement   General Comments  noted bilateral hand edema;educated pt on edema management strategies     Exercises     Shoulder Instructions      Home Living Family/patient expects to be discharged to:: Private residence Living Arrangements: Alone Available Help at Discharge: Family;Available PRN/intermittently Type of Home: House       Home Layout: One level     Bathroom Shower/Tub: Walk-in shower         Home Equipment: None   Additional Comments: pt appears to be poor historian      Prior Functioning/Environment Level of Independence: Independent        Comments: states he was working, unsure of accuracy        OT Problem List: Decreased activity tolerance;Impaired balance (sitting and/or standing);Decreased safety awareness;Decreased knowledge of use of DME or AE;Decreased cognition;Decreased knowledge of precautions;Increased edema      OT Treatment/Interventions: Self-care/ADL training;Therapeutic exercise;DME and/or AE instruction;Therapeutic activities;Cognitive remediation/compensation;Patient/family  education;Balance training    OT Goals(Current goals can be found in the care plan section) Acute Rehab OT Goals Patient Stated Goal: "be able to use the bathroom like normal" OT Goal Formulation: With patient Time For Goal Achievement: 03/11/19 Potential to Achieve Goals: Good ADL Goals Pt Will Perform Grooming: with modified independence Pt Will Perform Upper Body Dressing: with modified independence Pt Will Perform Lower Body Dressing: with modified independence;sit to/from stand Pt Will Transfer to Toilet: with modified independence;ambulating  OT Frequency: Min 2X/week   Barriers to D/C: Decreased caregiver support  pt reports he lives alone       Co-evaluation              AM-PAC OT "6 Clicks" Daily Activity     Outcome Measure Help from another person eating meals?: A Little Help from another person taking care of personal grooming?: A Little Help from another person toileting, which includes using toliet, bedpan, or urinal?: A Little Help from another person bathing (including washing, rinsing, drying)?: A Little Help from another person to put on and taking off regular upper body clothing?: A Little Help from another person to put on and taking off regular lower body clothing?: A Little 6 Click Score: 18   End of Session Equipment Utilized During Treatment: Gait belt;Rolling walker Nurse Communication: Mobility status(rectal pouch dysfunction)  Activity Tolerance: Patient tolerated treatment well Patient left: in chair;with chair alarm set;with call bell/phone within reach(with telesitter on)  OT Visit Diagnosis: Unsteadiness on feet (R26.81);Other abnormalities of gait and mobility (R26.89);Muscle weakness (generalized) (M62.81);Other symptoms and signs involving cognitive function                Time: 1045-1120 OT Time Calculation (min): 35 min Charges:  OT General Charges $OT Visit: 1 Visit OT Evaluation $OT Eval Moderate  Complexity: 1 Mod OT  Treatments $Self Care/Home Management : 8-22 mins  Dorinda Hill OTR/L Acute Rehabilitation Services Office: [place] 02/25/2019, 11:38 AM

## 2019-02-25 NOTE — Progress Notes (Signed)
Physical Therapy Treatment Patient Details Name: Rodney Barber MRN: [id number] DOB: 1965/08/15 Today's Date: 02/25/2019    History of Present Illness 53 y.o. male with medical history significant for uncontrolled insulin-dependent type 2 diabetes, hypertension, chronic pancreatitis with history of chronic biliary stenosis, tobacco use, alcohol use, and substance use disorder who presents to the ED for evaluation of persistent nausea and vomiting.    PT Comments    Pt was up in chair today at end of session, with legs elevated and pt had call light.  Pt is concerned about his living situation, now expecting rehab but not sure of what follows.  Continue on acutely for work with balance, endurance and safety with as little an AD as possible since pt is unlikely to use one after DC.  Follow up with him until dc to rehab.   Follow Up Recommendations  SNF     Equipment Recommendations  None recommended by PT    Recommendations for Other Services       Precautions / Restrictions Precautions Precautions: Fall Precaution Comments: has telemonitor in room Restrictions Weight Bearing Restrictions: No    Mobility  Bed Mobility Overal bed mobility: Needs Assistance Bed Mobility: Supine to Sit     Supine to sit: Min guard        Transfers Overall transfer level: Needs assistance Equipment used: 1 person hand held assist Transfers: Sit to/from Stand Sit to Stand: Min assist         General transfer comment: pt reaches to PT for hand hold  Ambulation/Gait Ambulation/Gait assistance: Min guard Gait Distance (Feet): 45 Feet Assistive device: Rolling walker (2 wheeled) Gait Pattern/deviations: Step-through pattern;Narrow base of support;Decreased stride length Gait velocity: reduced Gait velocity interpretation: <1.31 ft/sec, indicative of household ambulator General Gait Details: using walker by carrying it initially then placed on the floor to push it   Stairs              Wheelchair Mobility    Modified Rankin (Stroke Patients Only)       Balance Overall balance assessment: Needs assistance Sitting-balance support: Feet supported Sitting balance-Leahy Scale: Good     Standing balance support: Bilateral upper extremity supported;During functional activity Standing balance-Leahy Scale: Fair Standing balance comment: less than fair dynamically                            Cognition Arousal/Alertness: Lethargic Behavior During Therapy: Flat affect Overall Cognitive Status: Impaired/Different from baseline Area of Impairment: Awareness;Problem solving;Safety/judgement                   Current Attention Level: Sustained Memory: Decreased short-term memory Following Commands: Follows one step commands inconsistently;Follows one step commands with increased time Safety/Judgement: Decreased awareness of safety;Decreased awareness of deficits Awareness: Intellectual Problem Solving: Slow processing;Requires verbal cues General Comments: tending to self limit today but is able to follow instructions better      Exercises      General Comments General comments (skin integrity, edema, etc.): pt was up in chair by end of session with better awareness of his abilities, has expressed concern about where he will live      Pertinent Vitals/Pain Pain Assessment: No/denies pain    Home Living                      Prior Function            PT Goals (current goals  can now be found in the care plan section) Acute Rehab PT Goals Patient Stated Goal: to be up and walking, figure out where to go home Progress towards PT goals: Progressing toward goals    Frequency    Min 3X/week      PT Plan Current plan remains appropriate    Co-evaluation              AM-PAC PT "6 Clicks" Mobility   Outcome Measure  Help needed turning from your back to your side while in a flat bed without using bedrails?:  None Help needed moving from lying on your back to sitting on the side of a flat bed without using bedrails?: A Little Help needed moving to and from a bed to a chair (including a wheelchair)?: A Little Help needed standing up from a chair using your arms (e.g., wheelchair or bedside chair)?: A Little Help needed to walk in hospital room?: A Little Help needed climbing 3-5 steps with a railing? : A Lot 6 Click Score: 18    End of Session Equipment Utilized During Treatment: Gait belt Activity Tolerance: Patient tolerated treatment well;Patient limited by fatigue Patient left: in chair;with call bell/phone within reach;with chair alarm set Nurse Communication: Mobility status PT Visit Diagnosis: Unsteadiness on feet (R26.81);Muscle weakness (generalized) (M62.81);Difficulty in walking, not elsewhere classified (R26.2)     Time: 1446-1510 PT Time Calculation (min) (ACUTE ONLY): 24 min  Charges:  $Gait Training: 8-22 mins $Therapeutic Activity: 8-22 mins                   Ramond Dial 02/25/2019, 3:48 PM   Mee Hives, PT MS Acute Rehab Dept. Number: [place] and [place]

## 2019-02-26 DIAGNOSIS — K861 Other chronic pancreatitis: Secondary | ICD-10-CM

## 2019-02-26 DIAGNOSIS — N179 Acute kidney failure, unspecified: Secondary | ICD-10-CM

## 2019-02-26 DIAGNOSIS — T1491XA Suicide attempt, initial encounter: Secondary | ICD-10-CM

## 2019-02-26 DIAGNOSIS — G92 Toxic encephalopathy: Secondary | ICD-10-CM

## 2019-02-26 DIAGNOSIS — K7110 Toxic liver disease with hepatic necrosis, without coma: Secondary | ICD-10-CM

## 2019-02-26 DIAGNOSIS — F1990 Other psychoactive substance use, unspecified, uncomplicated: Secondary | ICD-10-CM

## 2019-02-26 DIAGNOSIS — Z720 Tobacco use: Secondary | ICD-10-CM

## 2019-02-26 DIAGNOSIS — T391X2A Poisoning by 4-Aminophenol derivatives, intentional self-harm, initial encounter: Principal | ICD-10-CM

## 2019-02-26 LAB — COMPREHENSIVE METABOLIC PANEL
ALT: 273 U/L — ABNORMAL HIGH (ref 0–44)
AST: 73 U/L — ABNORMAL HIGH (ref 15–41)
Albumin: 1.8 g/dL — ABNORMAL LOW (ref 3.5–5.0)
Alkaline Phosphatase: 504 U/L — ABNORMAL HIGH (ref 38–126)
Anion gap: 6 (ref 5–15)
BUN: 26 mg/dL — ABNORMAL HIGH (ref 6–20)
CO2: 22 mmol/L (ref 22–32)
Calcium: 7.8 mg/dL — ABNORMAL LOW (ref 8.9–10.3)
Chloride: 111 mmol/L (ref 98–111)
Creatinine, Ser: 1.46 mg/dL — ABNORMAL HIGH (ref 0.61–1.24)
GFR calc Af Amer: >60 mL/min (ref >60)
GFR calc non Af Amer: 54 mL/min — ABNORMAL LOW (ref >60)
Glucose, Bld: 190 mg/dL — ABNORMAL HIGH (ref 70–99)
Potassium: 3.5 mmol/L (ref 3.5–5.1)
Sodium: 139 mmol/L (ref 135–145)
Total Bilirubin: 1.7 mg/dL — ABNORMAL HIGH (ref 0.3–1.2)
Total Protein: 4.8 g/dL — ABNORMAL LOW (ref 6.5–8.1)

## 2019-02-26 LAB — GLUCOSE, CAPILLARY
Glucose-Capillary: 168 mg/dL — ABNORMAL HIGH (ref 70–99)
Glucose-Capillary: 235 mg/dL — ABNORMAL HIGH (ref 70–99)
Glucose-Capillary: 303 mg/dL — ABNORMAL HIGH (ref 70–99)
Glucose-Capillary: 275 mg/dL — ABNORMAL HIGH (ref 70–99)
Glucose-Capillary: 112 mg/dL — ABNORMAL HIGH (ref 70–99)

## 2019-02-26 LAB — CBC
HCT: 26 % — ABNORMAL LOW (ref 39.0–52.0)
Hemoglobin: 8.6 g/dL — ABNORMAL LOW (ref 13.0–17.0)
MCH: 29.4 pg (ref 26.0–34.0)
MCHC: 33.1 g/dL (ref 30.0–36.0)
MCV: 88.7 fL (ref 80.0–100.0)
Platelets: 96 10*3/uL — ABNORMAL LOW (ref 150–400)
RBC: 2.93 MIL/uL — ABNORMAL LOW (ref 4.22–5.81)
RDW: 13.5 % (ref 11.5–15.5)
WBC: 10.1 10*3/uL (ref 4.0–10.5)
nRBC: 0 % (ref 0.0–0.2)

## 2019-02-26 LAB — AMMONIA: Ammonia: 40 umol/L — ABNORMAL HIGH (ref 9–35)

## 2019-02-26 LAB — MAGNESIUM: Magnesium: 1.7 mg/dL (ref 1.7–2.4)

## 2019-02-26 LAB — PROTIME-INR
INR: 1.2 (ref 0.8–1.2)
Prothrombin Time: 14.6 seconds (ref 11.4–15.2)

## 2019-02-26 MED ORDER — INSULIN GLARGINE 100 UNIT/ML ~~LOC~~ SOLN
15.0000 [IU] | Freq: Every day | SUBCUTANEOUS | Status: DC
  Administered 2019-02-27 – 2019-03-01 (×3): 15 [IU] via SUBCUTANEOUS
  Filled 2019-02-26 (×4): qty 0.15

## 2019-02-26 MED ORDER — ENSURE ENLIVE PO LIQD
237.0000 mL | Freq: Two times a day (BID) | ORAL | Status: DC
  Administered 2019-02-27: 237 mL via ORAL

## 2019-02-26 NOTE — TOC Initial Note (Signed)
Transition of Care [facility]) - Initial/Assessment Note    Patient Details  Name: Sayer Masini MRN: [id number] Date of Birth: 08-23-1965  [facility]) CM/SW Contact:    Benard Halsted, LCSW Phone Number: 02/26/2019, 9:37 AM  Clinical Narrative:                 Patient is requesting SNF placement, however, barriers to placement still include lack of insurance and substance use. No accepting LOG facilities available at this time. CSW will continue to follow.     Barriers to Discharge: Continued Medical Work up   Patient Goals and CMS Choice Patient states their goals for this hospitalization and ongoing recovery are:: Rehab CMS Medicare.gov Compare Post Acute Care list provided to:: Patient Choice offered to / list presented to : Patient  Expected Discharge Plan and Services   In-house Referral: Clinical Social Work, Hospice / Palliative Care Discharge Planning Services: NA Post Acute Care Choice: [place] Living arrangements for the past 2 months: Single Family Home                           HH Arranged: NA          Prior Living Arrangements/Services Living arrangements for the past 2 months: Single Family Home Lives with:: Self Patient language and need for interpreter reviewed:: Yes Do you feel safe going back to the place where you live?: No   Lives alone  Need for Family Participation in Patient Care: Yes (Comment) Care giver support system in place?: Yes (comment) Current home services: DME Criminal Activity/Legal Involvement Pertinent to Current Situation/Hospitalization: No - Comment as needed  Activities of Daily Living Home Assistive Devices/Equipment: CBG Meter ADL Screening (condition at time of admission) Patient's cognitive ability adequate to safely complete daily activities?: Yes Is the patient deaf or have difficulty hearing?: No Does the patient have difficulty seeing, even when wearing glasses/contacts?: No Does the  patient have difficulty concentrating, remembering, or making decisions?: Yes Patient able to express need for assistance with ADLs?: Yes Does the patient have difficulty dressing or bathing?: No Independently performs ADLs?: Yes (appropriate for developmental age) Does the patient have difficulty walking or climbing stairs?: No Weakness of Legs: None Weakness of Arms/Hands: None  Permission Sought/Granted Permission sought to share information with : Family Supports    Share Information with NAME: Merry Proud     Permission granted to share info w Relationship: Brother  Permission granted to share info w Contact Information: [phone]  Emotional Assessment Appearance:: Appears stated age Attitude/Demeanor/Rapport: Unable to Assess Affect (typically observed): Unable to Assess Orientation: : (Disoriented x4) Alcohol / Substance Use: Illicit Drugs Psych Involvement: No (comment)  Admission diagnosis:  AKI (acute kidney injury) ([place]) [N17.9] Diabetic ketoacidosis without coma associated with type 1 diabetes mellitus ([place]) [E10.10] Patient Active Problem List   Diagnosis Date Noted  . Acetaminophen toxicity   . Goals of care, counseling/discussion   . Palliative care by specialist   . Shock liver 02/18/2019  . Hyperosmolar non-ketotic state in patient with type 2 diabetes mellitus ([place]) 02/18/2019  . Alcohol use 02/18/2019  . Substance use disorder 02/18/2019  . AKI (acute kidney injury) ([place])   . Altered behavior   . Altered mental status 12/02/2018  . Abnormal liver function 12/02/2018  . AMS (altered mental status) 12/02/2018  . Cough 12/02/2018  . Uncontrolled type 2 diabetes mellitus ([place]) 03/15/2018  . Urine test positive for microalbuminuria 07/12/2017  .  DM (diabetes mellitus) type 2, uncontrolled, with ketoacidosis (HCC) 09/08/2013  . Liver lesion 07/22/2013  . 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 Diabetes (HCC) 07/18/2013  . Hypokalemia 07/11/2013   . Protein-calorie malnutrition, severe (HCC) 07/11/2013  . Chronic pancreatitis (HCC) 07/10/2013  . 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 Diabetes mellitus (HCC) 07/10/2013  . Weight loss 07/10/2013  . Diarrhea 07/10/2013  . Liver lesion, right lobe 07/10/2013   PCP:  [name], NP Pharmacy:   [facility] - [place], [place] - [place] [address] [place] [address] Phone: [phone] Fax: [phone]     Social Determinants of Health (SDOH) Interventions    Readmission Risk Interventions Readmission Risk Prevention Plan 02/26/2019 02/22/2019  Transportation Screening Complete Complete  Medication Review Oceanographer) Complete Complete  PCP or Specialist appointment within 3-5 days of discharge Complete Complete  HRI or Home Care Consult Complete Complete  SW Recovery Care/Counseling Consult Complete Complete  Palliative Care Screening Complete Complete  Skilled Nursing Facility - Not Applicable  Some recent data might be hidden

## 2019-02-26 NOTE — Progress Notes (Signed)
Speech Language Pathology Treatment: Dysphagia  Patient Details Name: Rodney Barber MRN: [id number] DOB: July 05, 1966 Today's Date: 02/26/2019 Time: 3664-4034 SLP Time Calculation (min) (ACUTE ONLY): 8 min  Assessment / Plan / Recommendation Clinical Impression  Pt today able to remember me and verbalize recall of chin tuck strategy with question cues. Needed reminder of double swallow. Pt return demonstrated successfully. Reiterated mild dysphagia, benefit for compensatory strategies. All education complete, will sign off.   HPI HPI: Patient is a 53 y.o. 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  He was going to be transferred to [facility] liver unit but his liver test improved with treatment at [facility].      SLP Plan  All goals met       Recommendations  Diet recommendations: Regular;Thin liquid Liquids provided via: Cup;Straw Medication Administration: Whole meds with puree Supervision: Patient able to self feed;Staff to assist with self feeding;Intermittent supervision to cue for compensatory strategies Compensations: Minimize environmental distractions;Slow rate;Small sips/bites;Chin tuck;Effortful swallow;Multiple dry swallows after each bite/sip Postural Changes and/or Swallow Maneuvers: Seated upright 90 degrees                Oral Care Recommendations: Oral care BID Follow up Recommendations: Skilled Nursing facility SLP Visit Diagnosis: Dysphagia, unspecified (R13.10) Plan: All goals met       GO                Xochilth Standish, Riley Nearing 02/26/2019, 2:03 PM

## 2019-02-26 NOTE — Progress Notes (Signed)
Inpatient Diabetes Program Recommendations  AACE/ADA: New Consensus Statement on Inpatient Glycemic Control (2015)  Target Ranges:  Prepandial:   less than 140 mg/dL      Peak postprandial:   less than 180 mg/dL (1-2 hours)      Critically ill patients:  140 - 180 mg/dL    Review of Glycemic Control Results for BRODIE, CORRELL (MRN [id number]) as of 02/26/2019 13:08  Ref. Range 02/25/2019 08:03 02/25/2019 13:01 02/25/2019 16:26 02/25/2019 21:09 02/26/2019 03:40 02/26/2019 08:00 02/26/2019 12:30  Glucose-Capillary Latest Ref Range: 70 - 99 mg/dL 283 (H) 339 (H) 315 (H) 221 (H) 168 (H) 235 (H) 303 (H)   Current: Lantus 12 units Novolog 0-9 units tid  Inpatient Diabetes Program Recommendations:    Glucose trends elevated in the 200-300 range  Consider increasing Lantus to 15 units.  Thanks,  Tama Headings RN, MSN, BC-ADM Inpatient Diabetes Coordinator Team Pager [phone] (8a-5p)

## 2019-02-26 NOTE — Progress Notes (Signed)
Physical Therapy Treatment Patient Details Name: Rodney Barber MRN: [id number] DOB: [birth year] Today's Date: 02/26/2019    History of Present Illness 53 y.o. male with medical history significant for uncontrolled insulin-dependent type 2 diabetes, hypertension, chronic pancreatitis with history of chronic biliary stenosis, tobacco use, alcohol use, and substance use disorder who presents to the ED for evaluation of persistent nausea and vomiting.    PT Comments    Pt is mobilizing well and progressing towards mobility goals. Continue to feel stay in SNF would be appropriate considering pt's cognitive status, decreased safety awareness, and decreased awareness of deficits. Pt reports he is "in between places" and will not have any assistance at d/c.     Follow Up Recommendations  SNF     Equipment Recommendations  None recommended by PT    Recommendations for Other Services       Precautions / Restrictions Precautions Precautions: Fall Precaution Comments: sitter present. Suicide precautions Restrictions Weight Bearing Restrictions: No    Mobility  Bed Mobility Overal bed mobility: Needs Assistance Bed Mobility: Supine to Sit     Supine to sit: Supervision     General bed mobility comments: supervision for safety. No assist needed  Transfers Overall transfer level: Needs assistance Equipment used: Rolling walker (2 wheeled) Transfers: Sit to/from Stand Sit to Stand: Min guard         General transfer comment: cues for hand placement on RW  Ambulation/Gait Ambulation/Gait assistance: Min guard Gait Distance (Feet): 225 Feet Assistive device: Rolling walker (2 wheeled) Gait Pattern/deviations: Step-through pattern;Narrow base of support;Decreased stride length Gait velocity: reduced   General Gait Details: VC to keep RW on ground and for RW proximity. Overall steady with RW.   Stairs             Wheelchair Mobility    Modified Rankin (Stroke  Patients Only)       Balance Overall balance assessment: Needs assistance Sitting-balance support: Feet supported Sitting balance-Leahy Scale: Good     Standing balance support: Bilateral upper extremity supported;During functional activity Standing balance-Leahy Scale: Fair Standing balance comment: less than fair dynamically                            Cognition Arousal/Alertness: Awake/alert Behavior During Therapy: Flat affect Overall Cognitive Status: Impaired/Different from baseline Area of Impairment: Awareness;Problem solving;Safety/judgement                         Safety/Judgement: Decreased awareness of safety;Decreased awareness of deficits Awareness: Intellectual Problem Solving: Slow processing;Requires verbal cues General Comments: Pt able to find room when given room number, but slow processing to identify room.       Exercises      General Comments        Pertinent Vitals/Pain Pain Assessment: No/denies pain Faces Pain Scale: No hurt Pain Intervention(s): Monitored during session    Home Living                      Prior Function            PT Goals (current goals can now be found in the care plan section) Acute Rehab PT Goals Patient Stated Goal: to be up and walking, figure out where to go home PT Goal Formulation: Patient unable to participate in goal setting Time For Goal Achievement: 03/09/19 Potential to Achieve Goals: Good Progress towards PT goals: Progressing toward goals  Frequency    Min 3X/week      PT Plan Current plan remains appropriate    Co-evaluation              AM-PAC PT "6 Clicks" Mobility   Outcome Measure  Help needed turning from your back to your side while in a flat bed without using bedrails?: None Help needed moving from lying on your back to sitting on the side of a flat bed without using bedrails?: A Little Help needed moving to and from a bed to a chair  (including a wheelchair)?: A Little Help needed standing up from a chair using your arms (e.g., wheelchair or bedside chair)?: A Little Help needed to walk in hospital room?: A Little Help needed climbing 3-5 steps with a railing? : A Lot 6 Click Score: 18    End of Session Equipment Utilized During Treatment: Gait belt Activity Tolerance: Patient tolerated treatment well;Patient limited by fatigue Patient left: in chair;with call bell/phone within reach;with chair alarm set Nurse Communication: Mobility status PT Visit Diagnosis: Unsteadiness on feet (R26.81);Muscle weakness (generalized) (M62.81);Difficulty in walking, not elsewhere classified (R26.2)     Time: 6067-7034 PT Time Calculation (min) (ACUTE ONLY): 21 min  Charges:  $Gait Training: 8-22 mins                     Kallie Locks, [place] Pager [phone] Acute Rehab   Sheral Apley 02/26/2019, 1:59 PM

## 2019-02-26 NOTE — Progress Notes (Signed)
Paged psych to try to arrange consult time.

## 2019-02-26 NOTE — Plan of Care (Signed)
  Problem: Education: Goal: Knowledge of General Education information will improve Description: Including pain rating scale, medication(s)/side effects and non-pharmacologic comfort measures Outcome: Not Applicable   Problem: Health Behavior/Discharge Planning: Goal: Ability to manage health-related needs will improve Outcome: Not Progressing   Problem: Clinical Measurements: Goal: Will remain free from infection Outcome: Progressing Goal: Respiratory complications will improve Outcome: Completed/Met

## 2019-02-26 NOTE — NC FL2 (Signed)
[place] MEDICAID FL2 LEVEL OF CARE SCREENING TOOL     IDENTIFICATION  Patient Name: Rodney Barber Birthdate: 05-06-1966 Sex: male Admission Date (Current Location): 02/18/2019  [facility] and [place] Number:  Producer, television/film/video and Address:  [facility] [place], [address] [place], [place] [address]      Provider Number: [id number]  Attending Physician Name and Address:  Laverna Peace, MD  Relative Name and Phone Number:  Trey Paula, brother, [phone]    Current Level of Care: Hospital Recommended Level of Care: Skilled Nursing Facility Prior Approval Number:    Date Approved/Denied:   PASRR Number: [id number]  Discharge Plan: SNF    Current Diagnoses: Patient Active Problem List   Diagnosis Date Noted  . Acetaminophen toxicity   . Goals of care, counseling/discussion   . Palliative care by specialist   . Shock liver 02/18/2019  . Hyperosmolar non-ketotic state in patient with type 2 diabetes mellitus (HCC) 02/18/2019  . Alcohol use 02/18/2019  . Substance use disorder 02/18/2019  . AKI (acute kidney injury) (HCC)   . Altered behavior   . Altered mental status 12/02/2018  . Abnormal liver function 12/02/2018  . AMS (altered mental status) 12/02/2018  . Cough 12/02/2018  . Uncontrolled type 2 diabetes mellitus (HCC) 03/15/2018  . Urine test positive for microalbuminuria 07/12/2017  . DM (diabetes mellitus) type 2, uncontrolled, with ketoacidosis (HCC) 09/08/2013  . Liver lesion 07/22/2013  . 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 Diabetes (HCC) 07/18/2013  . Hypokalemia 07/11/2013  . Protein-calorie malnutrition, severe (HCC) 07/11/2013  . Chronic pancreatitis (HCC) 07/10/2013  . 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 Diabetes mellitus (HCC) 07/10/2013  . Weight loss 07/10/2013  . Diarrhea 07/10/2013  . Liver lesion, right lobe 07/10/2013    Orientation RESPIRATION BLADDER Height & Weight     Self,  Situation, Place  Normal Continent Weight: 108 lb 11 oz (49.3 kg) Height:  5\' 8"  (172.7 cm)  BEHAVIORAL SYMPTOMS/MOOD NEUROLOGICAL BOWEL NUTRITION STATUS      Continent Diet(Please see DC Summary)  AMBULATORY STATUS COMMUNICATION OF NEEDS Skin   Limited Assist Verbally Normal                       Personal Care Assistance Level of Assistance  Bathing, Feeding, Dressing Bathing Assistance: Limited assistance Feeding assistance: Independent Dressing Assistance: Independent     Functional Limitations Info  Sight, Hearing, Speech Sight Info: Adequate Hearing Info: Adequate Speech Info: Adequate    SPECIAL CARE FACTORS FREQUENCY  PT (By licensed PT), OT (By licensed OT)     PT Frequency: 5x/week OT Frequency: 3x/week            Contractures Contractures Info: Not present    Additional Factors Info  Code Status, Allergies, Insulin Sliding Scale Code Status Info: DNR Allergies Info: NKA   Insulin Sliding Scale Info: See DC Summary for dose       Current Medications (02/26/2019):  This is the current hospital active medication list Current Facility-Administered Medications  Medication Dose Route Frequency Provider Last Rate Last Dose  . chlorhexidine (PERIDEX) 0.12 % solution 15 mL  15 mL Mouth Rinse BID [place], Eric J, DO   15 mL at 02/26/19 0901  . cloNIDine (CATAPRES - Dosed in mg/24 hr) patch 0.2 mg  0.2 mg Transdermal Q Sat [place], Nancy A, DO   0.2 mg at 02/22/19 1726  . feeding supplement (PRO-STAT SUGAR FREE 64) liquid  30 mL  30 mL Oral TID WC Thurnell Lose, MD   30 mL at 02/26/19 0901  . folic acid (FOLVITE) tablet 1 mg  1 mg Oral Daily Zada Finders R, MD   1 mg at 02/26/19 0902  . hydrALAZINE (APRESOLINE) injection 10 mg  10 mg Intravenous Q6H PRN Thurnell Lose, MD   10 mg at 02/22/19 1700  . insulin aspart (novoLOG) injection 0-9 Units  0-9 Units Subcutaneous TID WC [place] Eric J, DO   3 Units at 02/26/19 3536  . insulin glargine (LANTUS)  injection 12 Units  12 Units Subcutaneous Daily Thurnell Lose, MD   12 Units at 02/26/19 [phone]  . lactose free nutrition (BOOST PLUS) liquid 237 mL  237 mL Oral TID WC Thurnell Lose, MD   237 mL at 02/26/19 0904  . lactulose (CHRONULAC) 10 GM/15ML solution 20 g  20 g Oral BID Thurnell Lose, MD   20 g at 02/26/19 0904  . MEDLINE mouth rinse  15 mL Mouth Rinse q12n4p [place] Eric J, DO   15 mL at 02/25/19 1404  . multivitamin with minerals tablet 1 tablet  1 tablet Oral Daily Lenore Cordia, MD   1 tablet at 02/26/19 0902  . nicotine (NICODERM CQ - dosed in mg/24 hours) patch 14 mg  14 mg Transdermal Daily Lenore Cordia, MD   14 mg at 02/26/19 0904  . promethazine (PHENERGAN) injection 6.25 mg  6.25 mg Intravenous Q6H PRN Zada Finders R, MD   6.25 mg at 02/19/19 0323  . thiamine (VITAMIN B-1) tablet 100 mg  100 mg Oral Daily Lenore Cordia, MD   100 mg at 02/26/19 1540     Discharge Medications: Please see discharge summary for a list of discharge medications.  Relevant Imaging Results:  Relevant Lab Results:   Additional Information SSN: [id number]    COVID negative on 02/18/19  Benard Halsted, LCSW

## 2019-02-26 NOTE — Progress Notes (Addendum)
[facility]  PROGRESS NOTE  Rodney Barber GNF:[id number] DOB: 13-Nov-1965 DOA: 02/18/2019 PCP: [name], NP  Brief History    Rodney Barber is a 53 y.o. year old male with medical history significant for uncontrolled insulin-dependent type 2 diabetes, hypertension, chronic pancreatitis with history of chronic biliary stenosis, tobacco use, alcohol use, and substance use disorder  who presented on 02/18/2019 with persistent nausea and vomiting and was found to have DKA with elevated LFTs and concern for acetaminophen toxicity no fulminant acute liver failure and acute hepatitis.  A & P  Acute liver failure/hepatitis concerning for acetaminophen toxicity, continues to improve.  LFTs, alkaline phosphatase and INR continue to improve.  Patient completed acetylcysteine course (last dose 7/18)j which seemed to have helped him turn the corner.  Given resolution of encephalopathy related to hepatic derangements and improvement in liver function labs patient is continue supportive care here at [facility] ( previously a candidate for transfer to [facility] when having liver fialiure)  Intentional overdose. Admitting to intention to harm with overdose of tylenol prior to admission.    Psychiatry consulted, anticipate inpatient psych need.  Acute liver failure induced coagulopathy.  INR trending down, peak of 4.4, currently 1.2.  Continue to trend  Acute hepatic/metabolic encephalopathy, much improved.  Elevated ammonia patient with acute liver failure was treated with lactulose.  Alert today and oriented to time and place with no signs of asterixis on exam.  We will continue current lactulose regimen and monitor closely.  Hypokalemia, resolved.  Potassium stable at 3.5, closely monitor.  AKI, improving  Presented with AKI with elevated creatinine like related to ATN in setting of liver failure bilirubin toxicity in addition to volume depletion, improved with IV fluids while patient was n.p.o.   Other etiologies of potential CKD include substance use (cocaine/alcohol use) now improved.  Creatinine remained stable at 1.49, avoid nephrotoxins, monitor urine output.  No indication for HD.  Normocytic anemia.  Hemoglobin 8.6  Type 2 diabetes, Poorly controlled, A1c 17.7%.  Fasting glucose of 235 in the setting of improved diet/oral intake.  Increase to Lantus 15 units, closely monitor CBGs, sliding scale no longer in DKA.  Malnutrition.  Nutrition consulted, continue Ensure supplementation.  Chronic alcohol use.  No signs of withdrawal currently.  Continue clonidine, folic acid and thiamine, as well as multivitamin.     Chronic pancreatitis.  Lipase within normal limits, denies any abdominal pain.  Has outpatient GI follow-up arranged.     DVT prophylaxis: SCDs Code Status: DNR Family Communication: No family at bedside Disposition Plan: Because to be medically stable for discharge needs to ensure stability and renal/liver function and additional 24 hours if so psych recommends inpatient psychiatric admission given admitted SI attempt.    [facility] Direct contact: see [email or web address] (further directions at bottom of note if needed) 7PM-7AM contact night coverage as at bottom of note 02/26/2019, 5:30 PM  LOS: 8 days   Consultants  . Nephrology, gastroenterology, psychiatry  Procedures  Renal US -    1. 19 mm mid to lower left renal calculus causing mild to moderate dilatation of the upper pole collecting system. 2. Additional smaller, nonobstructing calculi in the lower pole of the left kidney. 3. 10 mm nonobstructing right renal calculus. 4. Borderline increased echogenicity of both kidneys, suggesting the possibility of mild medical renal disease. 5. Mild amount of pericholecystic fluid without significant change and minimal ascites.  RUQ [place] - 1. Positive for cholelithiasis, borderline to mild gallbladder wall thickening, borderline  to mildly dilated CBD, and trace  pericholecystic fluid. No sonographic [name] sign was elicited, and there is no intrahepatic biliary dilatation to strongly suggest acute duct obstruction. However, consider the possibility of choledocholithiasis. MRCP versus Nuclear Medicine Hepatobiliary Scan could be valuable in this clinical setting. 2. Nephrolithiasis. No other acute ultrasound finding in the abdomen.  CT head - Non acute   Antibiotics  . none  Interval History/Subjective  Feels well this morning, denies any nausea or vomiting, no abdominal pain, did well with  Objective   Vitals:  Vitals:   02/26/19 0805 02/26/19 1155  BP: 132/83 (!) 150/77  Pulse: 71 72  Resp: 19 (!) 24  Temp: 98.2 F (36.8 C) 98.2 F (36.8 C)  SpO2: 99% 100%    Exam:  Cachectic, chronically ill-appearing male who looks older than stated age, awake Alert, Oriented X 3, No new F.N deficits, Normal affect Anicteric sclera Symmetrical Chest wall movement, Good air movement bilaterally, CTAB RRR systolic murmur heard loudest at apex +ve B.Sounds, Abd Soft, No tenderness, No organomegaly appriciated, No rebound - guarding or rigidity. No asterixis, alert, oriented to place, context, no appreciable focal deficits   I have personally reviewed the following:   Data Reviewed: Basic Metabolic Panel: Recent Labs  Lab 02/22/19 0433 02/22/19 1806 02/23/19 0341 02/24/19 0243 02/25/19 0435 02/26/19 0334  NA 148*  --  142 138 140 139  K 2.5* 2.9* 3.1* 3.8 3.2* 3.5  CL 118*  --  113* 112* 111 111  CO2 21*  --  22 19* 23 22  GLUCOSE 104*  --  229* 172* 225* 190*  BUN 32*  --  26* 21* 21* 26*  CREATININE 2.24*  --  1.99* 1.44* 1.55* 1.46*  CALCIUM 7.7*  --  7.4* 7.6* 7.4* 7.8*  MG 1.7  --  1.7 1.8 1.7 1.7  PHOS  --   --   --  1.0* 3.4  --    Liver Function Tests: Recent Labs  Lab 02/22/19 0433 02/23/19 0341 02/24/19 0243 02/25/19 0435 02/26/19 0334  AST 421* 149* 108* 74* 73*  ALT 1,244* 736* 547* 359* 273*  ALKPHOS 250* 263*  430* 528* 504*  BILITOT 5.2* 5.1* 4.3* 2.8* 1.7*  PROT 4.5* 4.2* 4.4* 4.5* 4.8*  ALBUMIN 2.0* 1.9* 2.0* 1.9* 1.8*   No results for input(s): LIPASE, AMYLASE in the last 168 hours. Recent Labs  Lab 02/22/19 0433 02/23/19 0341 02/24/19 0243 02/25/19 0457 02/26/19 0334  AMMONIA 43* 28 31 40* 40*   CBC: Recent Labs  Lab 02/22/19 0433 02/23/19 0341 02/24/19 0243 02/25/19 0435 02/26/19 0334  WBC 5.0 6.0 10.1 10.3 10.1  HGB 10.1* 9.6* 9.5* 9.0* 8.6*  HCT 30.0* 28.5* 27.9* 27.0* 26.0*  MCV 88.8 89.3 87.7 89.4 88.7  PLT 91* 95* 90* 101* 96*   Cardiac Enzymes: No results for input(s): CKTOTAL, CKMB, CKMBINDEX, TROPONINI in the last 168 hours. BNP (last 3 results) No results for input(s): BNP in the last 8760 hours.  ProBNP (last 3 results) No results for input(s): PROBNP in the last 8760 hours.  CBG: Recent Labs  Lab 02/25/19 2109 02/26/19 0340 02/26/19 0800 02/26/19 1230 02/26/19 1556  GLUCAP 221* 168* 235* 303* 275*    Recent Results (from the past 240 hour(s))  SARS Coronavirus 2 (CEPHEID - Performed in [facility] hospital lab), Hosp Order     Status: None   Collection Time: 02/18/19 10:38 PM   Specimen: Nasopharyngeal Swab  Result Value Ref Range Status  SARS Coronavirus 2 NEGATIVE NEGATIVE Final    Comment: (NOTE) If result is NEGATIVE SARS-CoV-2 target nucleic acids are NOT DETECTED. The SARS-CoV-2 RNA is generally detectable in upper and lower  respiratory specimens during the acute phase of infection. The lowest  concentration of SARS-CoV-2 viral copies this assay can detect is 250  copies / mL. A negative result does not preclude SARS-CoV-2 infection  and should not be used as the sole basis for treatment or other  patient management decisions.  A negative result may occur with  improper specimen collection / handling, submission of specimen other  than nasopharyngeal swab, presence of viral mutation(s) within the  areas targeted by this assay, and  inadequate number of viral copies  (<250 copies / mL). A negative result must be combined with clinical  observations, patient history, and epidemiological information. If result is POSITIVE SARS-CoV-2 target nucleic acids are DETECTED. The SARS-CoV-2 RNA is generally detectable in upper and lower  respiratory specimens dur ing the acute phase of infection.  Positive  results are indicative of active infection with SARS-CoV-2.  Clinical  correlation with patient history and other diagnostic information is  necessary to determine patient infection status.  Positive results do  not rule out bacterial infection or co-infection with other viruses. If result is PRESUMPTIVE POSTIVE SARS-CoV-2 nucleic acids MAY BE PRESENT.   A presumptive positive result was obtained on the submitted specimen  and confirmed on repeat testing.  While 2019 novel coronavirus  (SARS-CoV-2) nucleic acids may be present in the submitted sample  additional confirmatory testing may be necessary for epidemiological  and / or clinical management purposes  to differentiate between  SARS-CoV-2 and other Sarbecovirus currently known to infect humans.  If clinically indicated additional testing with an alternate test  methodology [phone]) is advised. The SARS-CoV-2 RNA is generally  detectable in upper and lower respiratory sp ecimens during the acute  phase of infection. The expected result is Negative. Fact Sheet for Patients:  BoilerBrush.com.cy Fact Sheet for Healthcare Providers: https://pope.com/ This test is not yet approved or cleared by the [place] FDA and has been authorized for detection and/or diagnosis of SARS-CoV-2 by FDA under an Emergency Use Authorization (EUA).  This EUA will remain in effect (meaning this test can be used) for the duration of the COVID-19 declaration under Section 564(b)(1) of the Act, 21 U.S.C. section 360bbb-3(b)(1), unless the  authorization is terminated or revoked sooner. Performed at [facility], [address] [place], [place] [address]   MRSA PCR Screening     Status: None   Collection Time: 02/19/19  2:50 AM   Specimen: Nasal Mucosa; Nasopharyngeal  Result Value Ref Range Status   MRSA by PCR NEGATIVE NEGATIVE Final    Comment:        The GeneXpert MRSA Assay (FDA approved for NASAL specimens only), is one component of a comprehensive MRSA colonization surveillance program. It is not intended to diagnose MRSA infection nor to guide or monitor treatment for MRSA infections. Performed at [facility], [address], [place], [place] [address]      Studies: No results found.  Scheduled Meds: . chlorhexidine  15 mL Mouth Rinse BID  . cloNIDine  0.2 mg Transdermal Q Sat  . [START ON 02/27/2019] feeding supplement (ENSURE ENLIVE)  237 mL Oral BID BM  . folic acid  1 mg Oral Daily  . insulin aspart  0-9 Units Subcutaneous TID WC  . insulin glargine  12  Units Subcutaneous Daily  . lactulose  20 g Oral BID  . mouth rinse  15 mL Mouth Rinse q12n4p  . multivitamin with minerals  1 tablet Oral Daily  . nicotine  14 mg Transdermal Daily  . thiamine  100 mg Oral Daily   Continuous Infusions:  Principal Problem:   Hyperosmolar non-ketotic state in patient with type 2 diabetes mellitus (HCC) Active Problems:   Chronic pancreatitis (HCC)   Hypokalemia   AKI (acute kidney injury) (HCC)   Shock liver   Alcohol use   Substance use disorder   Goals of care, counseling/discussion   Palliative care by specialist   Acetaminophen toxicity      Laverna Peace  [facility]

## 2019-02-26 NOTE — Progress Notes (Addendum)
Nutrition Follow-up  DOCUMENTATION CODES:   Severe malnutrition in context of chronic illness, Underweight  INTERVENTION:   -Magic Cup TID with meals, each supplement provides 290 kcals and 9 grams protein -D/c Boost Plus -D/c Prostat -Ensure Enlive po BID, each supplement provides 350 kcal and 20 grams of protein -Continue MVI with minerals daily  NUTRITION DIAGNOSIS:   Severe Malnutrition related to chronic illness(chronic pancreatitis) as evidenced by percent weight loss, moderate fat depletion, severe fat depletion, moderate muscle depletion, severe muscle depletion.  Ongoing  GOAL:   Patient will meet greater than or equal to 90% of their needs  Progressing  MONITOR:   PO intake, Supplement acceptance, Labs, Weight trends, Skin, I & O's  REASON FOR ASSESSMENT:   Malnutrition Screening Tool    ASSESSMENT:   53 year old male with medical history significant for IDDM, HTN,  EtoH abuse, chronic pancreatitis w/ hx of chronic biliary stenosis s/p ERCP w/ stent placement presented to ED with persistent n/v over the past 2 days  Reviewed I/O's: +980 ml x 24 hours and +12.1 L since admission  UOP: 700 ml x 24 hours  Reviewed wt hx; pt has experienced a 16.4% over the past 3 months, which is significant for time frame.   Spoke with pt at bedside, who was pleasant and in good spirits today. He reports his appetite is improving (meal completion documented at 75%). Pt reports he did not eat much breakfast "because I didn't like the wafer or pancake syrup". Pt sitting up eating spaghetti at time of visit without difficulty. RD discussed importance of good meal and supplement intake to promote healing; pt reports liking oral nutrition supplements.   Palliative following for goals of care.   Per CSW notes, anticipate SNF placement at discharge, however, barriers are lack of insurance and substance abuse.   Labs reviewed: CBGS: 168-235 (inpatient orders for glycemic control are  0-9 units insulin aspart TID with meals and 12 units insulin glargine daily).   NUTRITION - FOCUSED PHYSICAL EXAM:    Most Recent Value  Orbital Region  Severe depletion  Upper Arm Region  Severe depletion  Thoracic and Lumbar Region  Moderate depletion  Buccal Region  Severe depletion  Temple Region  Severe depletion  Clavicle Bone Region  Severe depletion  Clavicle and Acromion Bone Region  Severe depletion  Scapular Bone Region  Severe depletion  Dorsal Hand  Moderate depletion  Patellar Region  Severe depletion  Anterior Thigh Region  Severe depletion  Posterior Calf Region  Severe depletion  Edema (RD Assessment)  None  Hair  Reviewed  Eyes  Reviewed  Mouth  Reviewed  Skin  Reviewed  Nails  Reviewed       Diet Order:   Diet Order            Diet regular Room service appropriate? No; Fluid consistency: Thin  Diet effective now              EDUCATION NEEDS:   Education needs have been addressed  Skin:  Skin Assessment: Reviewed RN Assessment  Last BM:  02/26/19  Height:   Ht Readings from Last 1 Encounters:  02/19/19 5\' 8"  (1.727 m)    Weight:   Wt Readings from Last 1 Encounters:  02/22/19 49.3 kg    Ideal Body Weight:  70 kg  BMI:  Body mass index is 16.53 kg/m.  Estimated Nutritional Needs:   Kcal:  1700-1900  Protein:  85-100 grams  Fluid:  >  1.7 L    Alaysha Jefcoat A. Mayford Knife, RD, LDN, CDCES Registered Dietitian II Certified Diabetes Care and Education Specialist Pager: [phone] After hours Pager: [phone]

## 2019-02-26 NOTE — Consult Note (Signed)
Telepsych Consultation   Reason for Consult:  Suicide attempt  Referring Physician:  Dr. Oretha Milch Location of Patient: [facility] Location of Provider: [facility]  Patient Identification: Rodney Barber MRN:  [id number] Principal Diagnosis: Suicide attempt [facility]) Diagnosis:  Principal Problem:   Suicide attempt [facility]) Active Problems:   Chronic pancreatitis ([place])   Hypokalemia   AKI (acute kidney injury) ([place])   Shock liver   Hyperosmolar non-ketotic state in patient with type 2 diabetes mellitus ([place])   Alcohol use   Substance use disorder   Goals of care, counseling/discussion   Palliative care by specialist   Acetaminophen toxicity   Total Time spent with patient: 1 hour  Subjective:   Rodney Barber is a 53 y.o. male patient admitted with Acetaminophen overdose.  HPI:   Per chart review, patient was admitted with acute hepatic and metabolic encephalopathy. He was initially obtunded but improved and was treated with IV Acetylcysteine. He was going to be transferred to [facility] but this was cancelled after he improved. He later admitted to his nurse that he attempted to end his life by overdose. He has a history of substance abuse. UDS was positive for cocaine and BAL was negative on admission. Tylenol level was 43 on admission. Home medications include Lexapro 5 mg daily. He is recommended for SNF placement.   On interview, Rodney Barber is oriented to self and place. He reports that he is 53 y/o. He reports that he overdosed on Tylenol due to feeling depressed. He reports feeling depressed for a couple days due to "a lack of opportunity." He is unable to elaborate and appears to be drowsy throughout interview. He reports that he overdosed at home and was hoping to end his life. He called EMS following the ingestion. He denies current SI, HI or AVH. He denies a history of suicide attempts. He denies problems with sleep or appetite.   Past Psychiatric History:  Cocaine and alcohol abuse   Risk to Self: Yes given recent severe suicide attempt.  Risk to Others:   None. Denies HI.  Prior Inpatient Therapy:  Denies  Prior Outpatient Therapy:  Denies   Past Medical History:  Past Medical History:  Diagnosis Date  . Diabetes mellitus   . Hypertension   . Pancreatitis     Past Surgical History:  Procedure Laterality Date  . ERCP  06/27/2011   Procedure: ENDOSCOPIC RETROGRADE CHOLANGIOPANCREATOGRAPHY (ERCP);  Surgeon: Jeryl Columbia, MD;  Location[facility];  Service: Endoscopy;  Laterality: N/A;  . ERCP W/ PLASTIC STENT PLACEMENT  03/2011   Family History:  Family History  Problem Relation Age of Onset  . Diabetes Mother   . Diabetes Brother    Family Psychiatric  History: Denies  Social History:  Social History   Substance and Sexual Activity  Alcohol Use No     Social History   Substance and Sexual Activity  Drug Use Yes  . Types: "Crack" cocaine, Other-see comments, Cocaine   Comment: last smoked crack 12-16-16    Social History   Socioeconomic History  . Marital status: Single    Spouse name: Not on file  . Number of children: Not on file  . Years of education: Not on file  . Highest education level: Not on file  Occupational History  . Not on file  Social Needs  . Financial resource strain: Not on file  . Food insecurity    Worry: Not on file    Inability: Not on file  .  Transportation needs    Medical: Not on file    Non-medical: Not on file  Tobacco Use  . Smoking status: Current Every Day Smoker    Packs/day: 0.50    Years: 30.00    Pack years: 15.00    Types: Cigarettes  . Smokeless tobacco: Never Used  . Tobacco comment: 6 cigarett /day  Substance and Sexual Activity  . Alcohol use: No  . Drug use: Yes    Types: "Crack" cocaine, Other-see comments, Cocaine    Comment: last smoked crack 12-16-16  . Sexual activity: Never  Lifestyle  . Physical activity    Days per week: Not on file    Minutes per  session: Not on file  . Stress: Not on file  Relationships  . Social Musician on phone: Not on file    Gets together: Not on file    Attends religious service: Not on file    Active member of club or organization: Not on file    Attends meetings of clubs or organizations: Not on file    Relationship status: Not on file  Other Topics Concern  . Not on file  Social History Narrative  . Not on file   Additional Social History: He lives alone. He does not have children and has never never been married. He denies alcohol or illicit substance use and later reports cocaine use.     Allergies:  No Known Allergies  Labs:  Results for orders placed or performed during the hospital encounter of 02/18/19 (from the past 48 hour(s))  Glucose, capillary     Status: Abnormal   Collection Time: 02/24/19  5:27 PM  Result Value Ref Range   Glucose-Capillary 189 (H) 70 - 99 mg/dL  Glucose, capillary     Status: Abnormal   Collection Time: 02/24/19 11:40 PM  Result Value Ref Range   Glucose-Capillary 191 (H) 70 - 99 mg/dL  Glucose, capillary     Status: Abnormal   Collection Time: 02/25/19  4:12 AM  Result Value Ref Range   Glucose-Capillary 202 (H) 70 - 99 mg/dL  Comprehensive metabolic panel     Status: Abnormal   Collection Time: 02/25/19  4:35 AM  Result Value Ref Range   Sodium 140 135 - 145 mmol/L   Potassium 3.2 (L) 3.5 - 5.1 mmol/L   Chloride 111 98 - 111 mmol/L   CO2 23 22 - 32 mmol/L   Glucose, Bld 225 (H) 70 - 99 mg/dL   BUN 21 (H) 6 - 20 mg/dL   Creatinine, Ser 9.60 (H) 0.61 - 1.24 mg/dL   Calcium 7.4 (L) 8.9 - 10.3 mg/dL   Total Protein 4.5 (L) 6.5 - 8.1 g/dL   Albumin 1.9 (L) 3.5 - 5.0 g/dL   AST 74 (H) 15 - 41 U/L   ALT 359 (H) 0 - 44 U/L   Alkaline Phosphatase 528 (H) 38 - 126 U/L   Total Bilirubin 2.8 (H) 0.3 - 1.2 mg/dL   GFR calc non Af Amer 50 (L) >60 mL/min   GFR calc Af Amer 58 (L) >60 mL/min   Anion gap 6 5 - 15    Comment: Performed at [facility], [address], [place], [place] [address]  CBC     Status: Abnormal   Collection Time: 02/25/19  4:35 AM  Result Value Ref Range   WBC 10.3 4.0 - 10.5 K/uL   RBC 3.02 (L) 4.22 - 5.81 MIL/uL  Hemoglobin 9.0 (L) 13.0 - 17.0 g/dL   HCT 42.6 (L) 83.4 - 19.6 %   MCV 89.4 80.0 - 100.0 fL   MCH 29.8 26.0 - 34.0 pg   MCHC 33.3 30.0 - 36.0 g/dL   RDW 22.2 97.9 - 89.2 %   Platelets 101 (L) 150 - 400 K/uL    Comment: Immature Platelet Fraction may be clinically indicated, consider ordering this additional test [id number] CONSISTENT WITH PREVIOUS RESULT    nRBC 0.0 0.0 - 0.2 %    Comment: Performed at [facility], [address] [place], [place] [address]  Magnesium     Status: None   Collection Time: 02/25/19  4:35 AM  Result Value Ref Range   Magnesium 1.7 1.7 - 2.4 mg/dL    Comment: Performed at [facility], [address], [place], [place] [address]  Protime-INR     Status: None   Collection Time: 02/25/19  4:35 AM  Result Value Ref Range   Prothrombin Time 14.6 11.4 - 15.2 seconds   INR 1.2 0.8 - 1.2    Comment: (NOTE) INR goal varies based on device and disease states. Performed at [facility], [address] [place], [place] [address]   Phosphorus     Status: None   Collection Time: 02/25/19  4:35 AM  Result Value Ref Range   Phosphorus 3.4 2.5 - 4.6 mg/dL    Comment: Performed at [facility], [address], [place], [place] [address]  Ammonia     Status: Abnormal   Collection Time: 02/25/19  4:57 AM  Result Value Ref Range   Ammonia 40 (H) 9 - 35 umol/L    Comment: Performed at [facility], [address] [place], [place] [address]  Glucose, capillary     Status: Abnormal   Collection Time: 02/25/19  8:03 AM  Result Value Ref Range   Glucose-Capillary 283 (H) 70 - 99 mg/dL  Glucose, capillary     Status: Abnormal   Collection Time: 02/25/19  1:01 PM  Result Value Ref Range   Glucose-Capillary 339 (H) 70 - 99 mg/dL   Glucose, capillary     Status: Abnormal   Collection Time: 02/25/19  4:26 PM  Result Value Ref Range   Glucose-Capillary 315 (H) 70 - 99 mg/dL  Glucose, capillary     Status: Abnormal   Collection Time: 02/25/19  9:09 PM  Result Value Ref Range   Glucose-Capillary 221 (H) 70 - 99 mg/dL   Comment 1 Notify RN    Comment 2 Document in Chart   Comprehensive metabolic panel     Status: Abnormal   Collection Time: 02/26/19  3:34 AM  Result Value Ref Range   Sodium 139 135 - 145 mmol/L   Potassium 3.5 3.5 - 5.1 mmol/L   Chloride 111 98 - 111 mmol/L   CO2 22 22 - 32 mmol/L   Glucose, Bld 190 (H) 70 - 99 mg/dL   BUN 26 (H) 6 - 20 mg/dL   Creatinine, Ser 2.77 (H) 0.61 - 1.24 mg/dL   Calcium 7.8 (L) 8.9 - 10.3 mg/dL   Total Protein 4.8 (L) 6.5 - 8.1 g/dL   Albumin 1.8 (L) 3.5 - 5.0 g/dL   AST 73 (H) 15 - 41 U/L   ALT 273 (H) 0 - 44 U/L   Alkaline Phosphatase 504 (H) 38 - 126 U/L   Total Bilirubin 1.7 (H) 0.3 - 1.2 mg/dL  GFR calc non Af Amer 54 (L) >60 mL/min   GFR calc Af Amer >60 >60 mL/min   Anion gap 6 5 - 15    Comment: Performed at [facility], [address] [place], [place] [address]  CBC     Status: Abnormal   Collection Time: 02/26/19  3:34 AM  Result Value Ref Range   WBC 10.1 4.0 - 10.5 K/uL   RBC 2.93 (L) 4.22 - 5.81 MIL/uL   Hemoglobin 8.6 (L) 13.0 - 17.0 g/dL   HCT 62.126.0 (L) 30.839.0 - 65.752.0 %   MCV 88.7 80.0 - 100.0 fL   MCH 29.4 26.0 - 34.0 pg   MCHC 33.1 30.0 - 36.0 g/dL   RDW 84.613.5 96.211.5 - 95.215.5 %   Platelets 96 (L) 150 - 400 K/uL    Comment: Immature Platelet Fraction may be clinically indicated, consider ordering this additional test [id number] CONSISTENT WITH PREVIOUS RESULT    nRBC 0.0 0.0 - 0.2 %    Comment: Performed at [facility], [address] [place], [place] [address]  Magnesium     Status: None   Collection Time: 02/26/19  3:34 AM  Result Value Ref Range   Magnesium 1.7 1.7 - 2.4 mg/dL    Comment: Performed at [facility]  [facility], [address] [place], [place] [address]  Protime-INR     Status: None   Collection Time: 02/26/19  3:34 AM  Result Value Ref Range   Prothrombin Time 14.6 11.4 - 15.2 seconds   INR 1.2 0.8 - 1.2    Comment: (NOTE) INR goal varies based on device and disease states. Performed at [facility], [address] [place], [place] [address]   Ammonia     Status: Abnormal   Collection Time: 02/26/19  3:34 AM  Result Value Ref Range   Ammonia 40 (H) 9 - 35 umol/L    Comment: Performed at [facility], [address] [place], [place] [address]  Glucose, capillary     Status: Abnormal   Collection Time: 02/26/19  3:40 AM  Result Value Ref Range   Glucose-Capillary 168 (H) 70 - 99 mg/dL   Comment 1 Notify RN    Comment 2 Document in Chart   Glucose, capillary     Status: Abnormal   Collection Time: 02/26/19  8:00 AM  Result Value Ref Range   Glucose-Capillary 235 (H) 70 - 99 mg/dL  Glucose, capillary     Status: Abnormal   Collection Time: 02/26/19 12:30 PM  Result Value Ref Range   Glucose-Capillary 303 (H) 70 - 99 mg/dL   Comment 1 Notify RN     Medications:  Current Facility-Administered Medications  Medication Dose Route Frequency Provider Last Rate Last Dose  . chlorhexidine (PERIDEX) 0.12 % solution 15 mL  15 mL Mouth Rinse BID [place], Eric J, DO   15 mL at 02/26/19 0901  . cloNIDine (CATAPRES - Dosed in mg/24 hr) patch 0.2 mg  0.2 mg Transdermal Q Sat Finnigan, Nancy A, DO   0.2 mg at 02/22/19 1726  . feeding supplement (PRO-STAT SUGAR FREE 64) liquid 30 mL  30 mL Oral TID WC Leroy SeaSingh, Prashant K, MD   30 mL at 02/26/19 1315  . folic acid (FOLVITE) tablet 1 mg  1 mg Oral Daily Darreld McleanPatel, Vishal R, MD   1 mg at 02/26/19 0902  . hydrALAZINE (APRESOLINE) injection 10 mg  10 mg Intravenous Q6H PRN Susa RaringSingh, Prashant  K, MD   10 mg at 02/22/19 1700  . insulin aspart (novoLOG) injection 0-9 Units  0-9 Units Subcutaneous TID WC [place], Eric J, DO   7 Units at 02/26/19 1316  .  insulin glargine (LANTUS) injection 12 Units  12 Units Subcutaneous Daily Leroy SeaSingh, Prashant K, MD   12 Units at 02/26/19 [phone] [id number]  . lactose free nutrition (BOOST PLUS) liquid 237 mL  237 mL Oral TID WC Leroy SeaSingh, Prashant K, MD   237 mL at 02/26/19 1316  . lactulose (CHRONULAC) 10 GM/15ML solution 20 g  20 g Oral BID Leroy SeaSingh, Prashant K, MD   20 g at 02/26/19 0904  . MEDLINE mouth rinse  15 mL Mouth Rinse q12n4p [place], Eric J, DO   15 mL at 02/25/19 1404  . multivitamin with minerals tablet 1 tablet  1 tablet Oral Daily Charlsie QuestPatel, Vishal R, MD   1 tablet at 02/26/19 0902  . nicotine (NICODERM CQ - dosed in mg/24 hours) patch 14 mg  14 mg Transdermal Daily Charlsie QuestPatel, Vishal R, MD   14 mg at 02/26/19 0904  . promethazine (PHENERGAN) injection 6.25 mg  6.25 mg Intravenous Q6H PRN Darreld McleanPatel, Vishal R, MD   6.25 mg at 02/19/19 0323  . thiamine (VITAMIN B-1) tablet 100 mg  100 mg Oral Daily Charlsie QuestPatel, Vishal R, MD   100 mg at 02/26/19 0902    Musculoskeletal: Strength & Muscle Tone: No atrophy noted. Gait & Station: UTA since patient is lying in bed. Patient leans: N/A  Psychiatric Specialty Exam: Physical Exam  Nursing note and vitals reviewed. Constitutional: He appears well-developed and well-nourished.  HENT:  Head: Normocephalic and atraumatic.  Neck: Normal range of motion.  Respiratory: Effort normal.  Musculoskeletal: Normal range of motion.  Neurological: He is alert.  Only oriented to person and place.  Psychiatric: Judgment and thought content normal.    Review of Systems  Cardiovascular: Negative for chest pain.  Gastrointestinal: Negative for abdominal pain, constipation, diarrhea, nausea and vomiting.  Psychiatric/Behavioral: Positive for depression and substance abuse. Negative for hallucinations and suicidal ideas. The patient does not have insomnia.     Blood pressure (!) 150/77, pulse 72, temperature 98.2 F (36.8 C), temperature source Oral, resp. rate (!) 24, height 5\' 8"  (1.727 m), weight  49.3 kg, SpO2 100 %.Body mass index is 16.53 kg/m.  General Appearance: Fairly Groomed, middle aged, African American male, wearing a hospital gown who is lying in bed. NAD.   Eye Contact:  Good  Speech:  Clear and Coherent and Slow  Volume:  Decreased  Mood:  Depressed  Affect:  Constricted  Thought Process:  Linear and Descriptions of Associations: Intact  Orientation:  Other:  Oriented to person and place.  Thought Content:  Logical  Suicidal Thoughts:  No  Homicidal Thoughts:  No  Memory:  Immediate;   Poor Recent;   Fair Remote;   Fair  Judgement:  Poor  Insight:  Fair  Psychomotor Activity:  Decreased  Concentration:  Concentration: Fair and Attention Span: Fair  Recall:  FiservFair  Fund of Knowledge:  Fair  Language:  Fair  Akathisia:  No  Handed:  Right  AIMS (if indicated):   N/A  Assets:  Communication Skills Housing Resilience  ADL's:  Impaired  Cognition:  WNL  Sleep:   Okay   Assessment:  Rodney Barber is a 53 y.o. male who was admitted with acute hepatic and metabolic encephalopathy. He was initially obtunded but improved and was treated with IV Acetylcysteine. Patient  admits to suicide attempt by Tylenol overdose due to depression. He is unable to identify stressors. He denies current SI, HI or AVH. He does not appear to be responding to internal stimuli. He warrants inpatient psychiatric hospitalization for stabilization and treatment.   Treatment Plan Summary: -Patient warrants inpatient psychiatric hospitalization given high risk of harm to self. -Continue bedside sitter.  -Defer starting medications for mood at this time due to liver injury. Can restart home Lexapro when medically stable.  -Please pursue involuntary commitment if patient refuses voluntary psychiatric hospitalization or attempts to leave the hospital.  -Will sign off on patient at this time. Please consult psychiatry again as needed.     Disposition: Recommend psychiatric Inpatient  admission when medically cleared.  This service was provided via telemedicine using a 2-way, interactive audio and video technology.  Names of all persons participating in this telemedicine service and their role in this encounter. Name: Juanetta BeetsJacqueline , DO Role: Psychiatrist   Name: Rodney Mayaavid Barber  Role: Patient     Cherly BeachJacqueline J , DO 02/26/2019 2:05 PM

## 2019-02-27 DIAGNOSIS — Z7289 Other problems related to lifestyle: Secondary | ICD-10-CM

## 2019-02-27 LAB — CBC
HCT: 24.4 % — ABNORMAL LOW (ref 39.0–52.0)
Hemoglobin: 7.9 g/dL — ABNORMAL LOW (ref 13.0–17.0)
MCH: 29.5 pg (ref 26.0–34.0)
MCHC: 32.4 g/dL (ref 30.0–36.0)
MCV: 91 fL (ref 80.0–100.0)
Platelets: 117 10*3/uL — ABNORMAL LOW (ref 150–400)
RBC: 2.68 MIL/uL — ABNORMAL LOW (ref 4.22–5.81)
RDW: 13.7 % (ref 11.5–15.5)
WBC: 8.5 10*3/uL (ref 4.0–10.5)
nRBC: 0 % (ref 0.0–0.2)

## 2019-02-27 LAB — COMPREHENSIVE METABOLIC PANEL
ALT: 207 U/L — ABNORMAL HIGH (ref 0–44)
AST: 61 U/L — ABNORMAL HIGH (ref 15–41)
Albumin: 1.8 g/dL — ABNORMAL LOW (ref 3.5–5.0)
Alkaline Phosphatase: 425 U/L — ABNORMAL HIGH (ref 38–126)
Anion gap: 8 (ref 5–15)
BUN: 26 mg/dL — ABNORMAL HIGH (ref 6–20)
CO2: 22 mmol/L (ref 22–32)
Calcium: 8.1 mg/dL — ABNORMAL LOW (ref 8.9–10.3)
Chloride: 108 mmol/L (ref 98–111)
Creatinine, Ser: 1.39 mg/dL — ABNORMAL HIGH (ref 0.61–1.24)
GFR calc Af Amer: >60 mL/min (ref >60)
GFR calc non Af Amer: 57 mL/min — ABNORMAL LOW (ref >60)
Glucose, Bld: 81 mg/dL (ref 70–99)
Potassium: 3.2 mmol/L — ABNORMAL LOW (ref 3.5–5.1)
Sodium: 138 mmol/L (ref 135–145)
Total Bilirubin: 1.4 mg/dL — ABNORMAL HIGH (ref 0.3–1.2)
Total Protein: 4.5 g/dL — ABNORMAL LOW (ref 6.5–8.1)

## 2019-02-27 LAB — PROTIME-INR
INR: 1 (ref 0.8–1.2)
Prothrombin Time: 13.5 seconds (ref 11.4–15.2)

## 2019-02-27 LAB — GLUCOSE, CAPILLARY
Glucose-Capillary: 101 mg/dL — ABNORMAL HIGH (ref 70–99)
Glucose-Capillary: 165 mg/dL — ABNORMAL HIGH (ref 70–99)
Glucose-Capillary: 201 mg/dL — ABNORMAL HIGH (ref 70–99)
Glucose-Capillary: 360 mg/dL — ABNORMAL HIGH (ref 70–99)
Glucose-Capillary: 416 mg/dL — ABNORMAL HIGH (ref 70–99)
Glucose-Capillary: 74 mg/dL (ref 70–99)

## 2019-02-27 LAB — AMMONIA: Ammonia: 39 umol/L — ABNORMAL HIGH (ref 9–35)

## 2019-02-27 LAB — MAGNESIUM: Magnesium: 1.8 mg/dL (ref 1.7–2.4)

## 2019-02-27 MED ORDER — GLUCERNA SHAKE PO LIQD
237.0000 mL | Freq: Three times a day (TID) | ORAL | Status: DC
  Administered 2019-02-27 – 2019-03-06 (×15): 237 mL via ORAL

## 2019-02-27 MED ORDER — INSULIN ASPART 100 UNIT/ML ~~LOC~~ SOLN
3.0000 [IU] | Freq: Three times a day (TID) | SUBCUTANEOUS | Status: DC
  Administered 2019-02-27 – 2019-03-01 (×6): 3 [IU] via SUBCUTANEOUS

## 2019-02-27 MED ORDER — ESCITALOPRAM OXALATE 10 MG PO TABS
5.0000 mg | ORAL_TABLET | Freq: Every day | ORAL | Status: DC
  Administered 2019-02-27 – 2019-03-06 (×8): 5 mg via ORAL
  Filled 2019-02-27 (×8): qty 1

## 2019-02-27 NOTE — Plan of Care (Signed)
  Problem: Clinical Measurements: Goal: Ability to maintain clinical measurements within normal limits will improve Outcome: Progressing   Problem: Coping: Goal: Level of anxiety will decrease Outcome: Progressing   

## 2019-02-27 NOTE — TOC Progression Note (Signed)
Transition of Care [facility]) - Progression Note    Patient Details  Name: Rodney Barber MRN: [id number] Date of Birth: [birth year]  Transition of Care [facility]) CM/SW [place], LCSW Phone Number: 02/27/2019, 2:49 PM  Clinical Narrative:    CSW received consult regarding psych placement. Will send referral out once patient is medically cleared. Will likely need to do an IVC since patient is confused.      Barriers to Discharge: Continued Medical Work up  Expected Discharge Plan and Services   In-house Referral: Clinical Social Work, Hospice / Palliative Care Discharge Planning Services: NA Post Acute Care Choice: [place] Living arrangements for the past 2 months: Single Family Home                           HH Arranged: NA           Social Determinants of Health (SDOH) Interventions    Readmission Risk Interventions Readmission Risk Prevention Plan 02/26/2019 02/22/2019  Transportation Screening Complete Complete  Medication Review Press photographer) Complete Complete  PCP or Specialist appointment within 3-5 days of discharge Complete Complete  HRI or Home Care Consult Complete Complete  SW Recovery Care/Counseling Consult Complete Complete  Palliative Care Screening Complete Complete  [place] - Not Applicable  Some recent data might be hidden

## 2019-02-27 NOTE — Progress Notes (Addendum)
Inpatient Diabetes Program Recommendations  AACE/ADA: New Consensus Statement on Inpatient Glycemic Control (2015)  Target Ranges:  Prepandial:   less than 140 mg/dL      Peak postprandial:   less than 180 mg/dL (1-2 hours)      Critically ill patients:  140 - 180 mg/dL   Lab Results  Component Value Date   GLUCAP 101 (H) 02/27/2019   HGBA1C 17.7 (H) 02/18/2019    Review of Glycemic Control Results for KRIST[place], HU (MRN [id number]) as of 02/27/2019 11:20  Ref. Range 02/26/2019 12:30 02/26/2019 15:56 02/26/2019 20:13 02/27/2019 04:17  Glucose-Capillary Latest Ref Range: 70 - 99 mg/dL 303 (H) 275 (H) 112 (H) 74   Diabetes history: Type 2 DM Outpatient Diabetes medications: Levemir 25 units QHS, Glipizide 5 mg BID Current orders for Inpatient glycemic control: Lantus 12 units Novolog 0-9 units tid  Inpatient Diabetes Program Recommendations:     Glucose trends increased post prandially. Noted increase to Lantus to 15 units, in agreement. Would also consider adding Novolog 3 units TID (assuming patient consuming >50% of meals), especially if trends at meals exceed 180 mg/dL.   Thanks, Bronson Curb, MSN, RNC-OB Diabetes Coordinator [phone] (8a-5p)

## 2019-02-27 NOTE — TOC Progression Note (Signed)
Transition of Care [facility]) - Progression Note    Patient Details  Name: Rodney Barber MRN: [id number] Date of Birth: 1966-04-15  Transition of Care [facility]) CM/SW [place], LCSW Phone Number: 02/27/2019, 4:42 PM  Clinical Narrative:    CSW sent referral to [facility] for review.      Barriers to Discharge: Continued Medical Work up  Expected Discharge Plan and Services   In-house Referral: Clinical Social Work, Hospice / Palliative Care Discharge Planning Services: NA Post Acute Care Choice: [place] Living arrangements for the past 2 months: Single Family Home                           HH Arranged: NA           Social Determinants of Health (SDOH) Interventions    Readmission Risk Interventions Readmission Risk Prevention Plan 02/26/2019 02/22/2019  Transportation Screening Complete Complete  Medication Review Press photographer) Complete Complete  PCP or Specialist appointment within 3-5 days of discharge Complete Complete  HRI or Home Care Consult Complete Complete  SW Recovery Care/Counseling Consult Complete Complete  Palliative Care Screening Complete Complete  [place] - Not Applicable  Some recent data might be hidden

## 2019-02-27 NOTE — Progress Notes (Signed)
[facility]  PROGRESS NOTE  Palmer Fahrner EPP:[id number] DOB: Dec 24, 1965 DOA: 02/18/2019 PCP: Gildardo Pounds, NP  Brief History    Charleston Hankin is a 53 y.o. year old male with medical history significant for uncontrolled insulin-dependent type 2 diabetes, hypertension, chronic pancreatitis with history of chronic biliary stenosis, tobacco use, alcohol use, and substance use disorder  who presented on 02/18/2019 with persistent nausea and vomiting and was found to have DKA with elevated LFTs and concern for acetaminophen toxicity no fulminant acute liver failure and acute hepatitis.  A & P  Acute liver failure/hepatitis concerning for acetaminophen toxicity, continues to improve.  LFTs, alkaline phosphatase and INR continue to improve.  Patient completed acetylcysteine course (last dose 7/18)j which seemed to have helped him turn the corner.  Given resolution of encephalopathy related to hepatic derangements and improvement in liver function labs patient has continued supportive care here at [facility] ( previously a candidate for transfer to [facility] when having liver fialiure)  Intentional overdose. Admitted to intention to harm with overdose of tylenol prior to admission.    Psychiatry consulted, and recommended inpatient psych, will resume home Lexapro, bedside sitter  Acute liver failure induced coagulopathy.  INR trending down, peak of 4.4, currently 1.2.  Continue to trend  Acute hepatic/metabolic encephalopathy, much improved.  Elevated ammonia patient with acute liver failure was treated with lactulose.  Alert today and oriented to time and place with no signs of asterixis on exam.  We will continue current lactulose regimen and monitor closely.  Hypokalemia, slightly worse.  Potassium stable at 3.2, replete, mg wnl, closely monitor.  AKI, improving  Presented with AKI with elevated creatinine like related to ATN in setting of liver failure bilirubin toxicity in addition to  volume depletion, improved with IV fluids while patient was n.p.o.  Other etiologies of potential CKD include substance use (cocaine/alcohol use) now improved.  Creatinine remained stable at 1.39, avoid nephrotoxins, monitor urine output.  No indication for HD.  Normocytic anemia.  Hemoglobin slightly lower than previous baseline currently 7.9, no signs or symptoms of bleeding, monitor CBC.  Type 2 diabetes, Poorly controlled, A1c 17.7%.  Fasting glucose of 101, blood sugars tend to be more elevated and lunchtime, have added scheduled short-acting insulin with meals, continue sliding scale, Lantus 15 days, closely monitor CBGs   Malnutrition.  Nutrition consulted, continue Ensure supplementation.  Chronic alcohol use.  No signs of withdrawal currently.  Continue clonidine, folic acid and thiamine, as well as multivitamin.     Chronic pancreatitis.  Lipase within normal limits, denies any abdominal pain.  Has outpatient GI follow-up arranged.     DVT prophylaxis: SCDs Code Status: DNR Family Communication: No family at bedside Disposition Plan: Medically stable for discharge,psych recommends inpatient psychiatric admission given admitted SI attempt.    [facility] Direct contact: see [email or web address] (further directions at bottom of note if needed) 7PM-7AM contact night coverage as at bottom of note 02/27/2019, 4:14 PM  LOS: 9 days   Consultants   Nephrology, gastroenterology, psychiatry  Procedures  Renal US -    1. 19 mm mid to lower left renal calculus causing mild to moderate dilatation of the upper pole collecting system. 2. Additional smaller, nonobstructing calculi in the lower pole of the left kidney. 3. 10 mm nonobstructing right renal calculus. 4. Borderline increased echogenicity of both kidneys, suggesting the possibility of mild medical renal disease. 5. Mild amount of pericholecystic fluid without significant change and minimal ascites.  RUQ [place] -  1. Positive for  cholelithiasis, borderline to mild gallbladder wall thickening, borderline to mildly dilated CBD, and trace pericholecystic fluid. No sonographic Eulah PontMurphy sign was elicited, and there is no intrahepatic biliary dilatation to strongly suggest acute duct obstruction. However, consider the possibility of choledocholithiasis. MRCP versus Nuclear Medicine Hepatobiliary Scan could be valuable in this clinical setting. 2. Nephrolithiasis. No other acute ultrasound finding in the abdomen.  CT head - Non acute   Antibiotics   none  Interval History/Subjective  Denies any abdominal pain nausea or vomiting.  Doing well with his diet.  Objective   Vitals:  Vitals:   02/27/19 0735 02/27/19 1133  BP: (!) 153/76 (!) 148/78  Pulse: (!) 59 63  Resp: 18 18  Temp: 98.3 F (36.8 C) 98.1 F (36.7 C)  SpO2: 99% 100%    Exam:  Cachectic, chronically ill-appearing male who looks older than stated age, awake Alert, slow in speech, oriented X 3, No new F.N deficits, Normal affect Anicteric sclera Symmetrical Chest wall movement, Good air movement bilaterally, CTAB +ve B.Sounds, Abd Soft, No tenderness, No organomegaly appriciated, No rebound - guarding or rigidity. No asterixis, alert, oriented to place, context, no appreciable focal deficits   I have personally reviewed the following:   Data Reviewed: Basic Metabolic Panel: Recent Labs  Lab 02/23/19 0341 02/24/19 0243 02/25/19 0435 02/26/19 0334 02/27/19 0227  NA 142 138 140 139 138  K 3.1* 3.8 3.2* 3.5 3.2*  CL 113* 112* 111 111 108  CO2 22 19* 23 22 22   GLUCOSE 229* 172* 225* 190* 81  BUN 26* 21* 21* 26* 26*  CREATININE 1.99* 1.44* 1.55* 1.46* 1.39*  CALCIUM 7.4* 7.6* 7.4* 7.8* 8.1*  MG 1.7 1.8 1.7 1.7 1.8  PHOS  --  1.0* 3.4  --   --    Liver Function Tests: Recent Labs  Lab 02/23/19 0341 02/24/19 0243 02/25/19 0435 02/26/19 0334 02/27/19 0227  AST 149* 108* 74* 73* 61*  ALT 736* 547* 359* 273* 207*  ALKPHOS 263* 430*  528* 504* 425*  BILITOT 5.1* 4.3* 2.8* 1.7* 1.4*  PROT 4.2* 4.4* 4.5* 4.8* 4.5*  ALBUMIN 1.9* 2.0* 1.9* 1.8* 1.8*   No results for input(s): LIPASE, AMYLASE in the last 168 hours. Recent Labs  Lab 02/23/19 0341 02/24/19 0243 02/25/19 0457 02/26/19 0334 02/27/19 0227  AMMONIA 28 31 40* 40* 39*   CBC: Recent Labs  Lab 02/23/19 0341 02/24/19 0243 02/25/19 0435 02/26/19 0334 02/27/19 0227  WBC 6.0 10.1 10.3 10.1 8.5  HGB 9.6* 9.5* 9.0* 8.6* 7.9*  HCT 28.5* 27.9* 27.0* 26.0* 24.4*  MCV 89.3 87.7 89.4 88.7 91.0  PLT 95* 90* 101* 96* 117*   Cardiac Enzymes: No results for input(s): CKTOTAL, CKMB, CKMBINDEX, TROPONINI in the last 168 hours. BNP (last 3 results) No results for input(s): BNP in the last 8760 hours.  ProBNP (last 3 results) No results for input(s): PROBNP in the last 8760 hours.  CBG: Recent Labs  Lab 02/26/19 2013 02/27/19 0417 02/27/19 0734 02/27/19 1154 02/27/19 1606  GLUCAP 112* 74 101* 360* 416*    Recent Results (from the past 240 hour(s))  SARS Coronavirus 2 (CEPHEID - Performed in [facility] hospital lab), Hosp Order     Status: None   Collection Time: 02/18/19 10:38 PM   Specimen: Nasopharyngeal Swab  Result Value Ref Range Status   SARS Coronavirus 2 NEGATIVE NEGATIVE Final    Comment: (NOTE) If result is NEGATIVE SARS-CoV-2 target nucleic acids are NOT DETECTED. The  SARS-CoV-2 RNA is generally detectable in upper and lower  respiratory specimens during the acute phase of infection. The lowest  concentration of SARS-CoV-2 viral copies this assay can detect is 250  copies / mL. A negative result does not preclude SARS-CoV-2 infection  and should not be used as the sole basis for treatment or other  patient management decisions.  A negative result may occur with  improper specimen collection / handling, submission of specimen other  than nasopharyngeal swab, presence of viral mutation(s) within the  areas targeted by this assay, and  inadequate number of viral copies  (<250 copies / mL). A negative result must be combined with clinical  observations, patient history, and epidemiological information. If result is POSITIVE SARS-CoV-2 target nucleic acids are DETECTED. The SARS-CoV-2 RNA is generally detectable in upper and lower  respiratory specimens dur ing the acute phase of infection.  Positive  results are indicative of active infection with SARS-CoV-2.  Clinical  correlation with patient history and other diagnostic information is  necessary to determine patient infection status.  Positive results do  not rule out bacterial infection or co-infection with other viruses. If result is PRESUMPTIVE POSTIVE SARS-CoV-2 nucleic acids MAY BE PRESENT.   A presumptive positive result was obtained on the submitted specimen  and confirmed on repeat testing.  While 2019 novel coronavirus  (SARS-CoV-2) nucleic acids may be present in the submitted sample  additional confirmatory testing may be necessary for epidemiological  and / or clinical management purposes  to differentiate between  SARS-CoV-2 and other Sarbecovirus currently known to infect humans.  If clinically indicated additional testing with an alternate test  methodology [id number]) is advised. The SARS-CoV-2 RNA is generally  detectable in upper and lower respiratory sp ecimens during the acute  phase of infection. The expected result is Negative. Fact Sheet for Patients:  BoilerBrush.com.cyhttps://www.fda.gov/media/136312/download Fact Sheet for Healthcare Providers: https://pope.com/https://www.fda.gov/media/136313/download This test is not yet approved or cleared by the [place] FDA and has been authorized for detection and/or diagnosis of SARS-CoV-2 by FDA under an Emergency Use Authorization (EUA).  This EUA will remain in effect (meaning this test can be used) for the duration of the COVID-19 declaration under Section 564(b)(1) of the Act, 21 U.S.C. section 360bbb-3(b)(1), unless the  authorization is terminated or revoked sooner. Performed at [facility], [address] [place], [place] [address]   MRSA PCR Screening     Status: None   Collection Time: 02/19/19  2:50 AM   Specimen: Nasal Mucosa; Nasopharyngeal  Result Value Ref Range Status   MRSA by PCR NEGATIVE NEGATIVE Final    Comment:        The GeneXpert MRSA Assay (FDA approved for NASAL specimens only), is one component of a comprehensive MRSA colonization surveillance program. It is not intended to diagnose MRSA infection nor to guide or monitor treatment for MRSA infections. Performed at [facility], [address] [place], [place] [address]      Studies: No results found.  Scheduled Meds:  chlorhexidine  15 mL Mouth Rinse BID   cloNIDine  0.2 mg Transdermal Q Sat   escitalopram  5 mg Oral Daily   feeding supplement (ENSURE ENLIVE)  237 mL Oral BID BM   folic acid  1 mg Oral Daily   insulin aspart  0-9 Units Subcutaneous TID WC   insulin aspart  3 Units Subcutaneous TID WC   insulin glargine  15 Units Subcutaneous Daily   lactulose  20  g Oral BID   mouth rinse  15 mL Mouth Rinse q12n4p   multivitamin with minerals  1 tablet Oral Daily   nicotine  14 mg Transdermal Daily   thiamine  100 mg Oral Daily   Continuous Infusions:  Principal Problem:   Suicide attempt (HCC) Active Problems:   Chronic pancreatitis (HCC)   Hypokalemia   AKI (acute kidney injury) (HCC)   Shock liver   Hyperosmolar non-ketotic state in patient with type 2 diabetes mellitus (HCC)   Alcohol use   Substance use disorder   Goals of care, counseling/discussion   Palliative care by specialist   Acetaminophen toxicity      Laverna Peace  [facility]

## 2019-02-27 NOTE — Progress Notes (Signed)
CBG 416; Dr. Lisbeth Ply paged and orders received to give 9 units.

## 2019-02-28 DIAGNOSIS — E876 Hypokalemia: Secondary | ICD-10-CM

## 2019-02-28 LAB — GLUCOSE, CAPILLARY
Glucose-Capillary: 133 mg/dL — ABNORMAL HIGH (ref 70–99)
Glucose-Capillary: 149 mg/dL — ABNORMAL HIGH (ref 70–99)
Glucose-Capillary: 193 mg/dL — ABNORMAL HIGH (ref 70–99)
Glucose-Capillary: 49 mg/dL — ABNORMAL LOW (ref 70–99)
Glucose-Capillary: 56 mg/dL — ABNORMAL LOW (ref 70–99)
Glucose-Capillary: 75 mg/dL (ref 70–99)
Glucose-Capillary: 46 mg/dL — ABNORMAL LOW (ref 70–99)
Glucose-Capillary: 59 mg/dL — ABNORMAL LOW (ref 70–99)
Glucose-Capillary: 79 mg/dL (ref 70–99)
Glucose-Capillary: 158 mg/dL — ABNORMAL HIGH (ref 70–99)

## 2019-02-28 LAB — CBC
HCT: 25.7 % — ABNORMAL LOW (ref 39.0–52.0)
Hemoglobin: 8.2 g/dL — ABNORMAL LOW (ref 13.0–17.0)
MCH: 29.5 pg (ref 26.0–34.0)
MCHC: 31.9 g/dL (ref 30.0–36.0)
MCV: 92.4 fL (ref 80.0–100.0)
Platelets: 149 10*3/uL — ABNORMAL LOW (ref 150–400)
RBC: 2.78 MIL/uL — ABNORMAL LOW (ref 4.22–5.81)
RDW: 13.8 % (ref 11.5–15.5)
WBC: 6.8 10*3/uL (ref 4.0–10.5)
nRBC: 0 % (ref 0.0–0.2)

## 2019-02-28 LAB — COMPREHENSIVE METABOLIC PANEL
ALT: 167 U/L — ABNORMAL HIGH (ref 0–44)
AST: 48 U/L — ABNORMAL HIGH (ref 15–41)
Albumin: 1.8 g/dL — ABNORMAL LOW (ref 3.5–5.0)
Alkaline Phosphatase: 382 U/L — ABNORMAL HIGH (ref 38–126)
Anion gap: 6 (ref 5–15)
BUN: 22 mg/dL — ABNORMAL HIGH (ref 6–20)
CO2: 23 mmol/L (ref 22–32)
Calcium: 7.9 mg/dL — ABNORMAL LOW (ref 8.9–10.3)
Chloride: 107 mmol/L (ref 98–111)
Creatinine, Ser: 1.44 mg/dL — ABNORMAL HIGH (ref 0.61–1.24)
GFR calc Af Amer: >60 mL/min (ref >60)
GFR calc non Af Amer: 55 mL/min — ABNORMAL LOW (ref >60)
Glucose, Bld: 132 mg/dL — ABNORMAL HIGH (ref 70–99)
Potassium: 3.3 mmol/L — ABNORMAL LOW (ref 3.5–5.1)
Sodium: 136 mmol/L (ref 135–145)
Total Bilirubin: 1.4 mg/dL — ABNORMAL HIGH (ref 0.3–1.2)
Total Protein: 4.7 g/dL — ABNORMAL LOW (ref 6.5–8.1)

## 2019-02-28 LAB — PROTIME-INR
INR: 1.1 (ref 0.8–1.2)
Prothrombin Time: 14 seconds (ref 11.4–15.2)

## 2019-02-28 LAB — MAGNESIUM: Magnesium: 1.7 mg/dL (ref 1.7–2.4)

## 2019-02-28 MED ORDER — POTASSIUM CHLORIDE 20 MEQ/15ML (10%) PO SOLN
40.0000 meq | Freq: Once | ORAL | Status: AC
  Administered 2019-02-28: 40 meq via ORAL
  Filled 2019-02-28: qty 30

## 2019-02-28 NOTE — Progress Notes (Signed)
Physical Therapy Treatment Patient Details Name: Rodney Barber MRN: [id number] DOB: 19-Feb-1966 Today's Date: 02/28/2019    History of Present Illness 53 y.o. male with medical history significant for uncontrolled insulin-dependent type 2 diabetes, hypertension, chronic pancreatitis with history of chronic biliary stenosis, tobacco use, alcohol use, and substance use disorder who presents to the ED for evaluation of persistent nausea and vomiting.    PT Comments    Patient ambulating in room several laps, poor safety awareness and use of RW. Requires supervision and cues for direction and use of AD.  Agree with SNF as patient would be very unsafe alone.   Follow Up Recommendations  SNF     Equipment Recommendations  None recommended by PT    Recommendations for Other Services       Precautions / Restrictions Precautions Precautions: Fall Precaution Comments: sitter present. Suicide precautions Restrictions Weight Bearing Restrictions: No    Mobility  Bed Mobility Overal bed mobility: Needs Assistance Bed Mobility: Supine to Sit     Supine to sit: Supervision     General bed mobility comments: supervision for safety. No assist needed  Transfers Overall transfer level: Needs assistance Equipment used: Rolling walker (2 wheeled) Transfers: Sit to/from Stand Sit to Stand: Min guard         General transfer comment: cues for hand placement on RW  Ambulation/Gait Ambulation/Gait assistance: Min guard Gait Distance (Feet): 45 Feet Assistive device: Rolling walker (2 wheeled) Gait Pattern/deviations: Step-through pattern;Narrow base of support;Decreased stride length Gait velocity: reduced   General Gait Details: cues for RW safety, patient following 50% of cues for directions i.e. asked to sit at chair and goes to the sink   Stairs             Wheelchair Mobility    Modified Rankin (Stroke Patients Only)       Balance Overall balance assessment:  Needs assistance Sitting-balance support: Feet supported Sitting balance-Leahy Scale: Good     Standing balance support: Bilateral upper extremity supported;During functional activity Standing balance-Leahy Scale: Fair Standing balance comment: less than fair dynamically                            Cognition Arousal/Alertness: Awake/alert Behavior During Therapy: Flat affect Overall Cognitive Status: Impaired/Different from baseline Area of Impairment: Awareness;Problem solving;Safety/judgement                         Safety/Judgement: Decreased awareness of safety;Decreased awareness of deficits Awareness: Intellectual Problem Solving: Slow processing;Requires verbal cues General Comments: Pt able to find room when given room number, but slow processing to identify room.       Exercises      General Comments        Pertinent Vitals/Pain Faces Pain Scale: No hurt    Home Living                      Prior Function            PT Goals (current goals can now be found in the care plan section) Acute Rehab PT Goals Patient Stated Goal: to be up and walking, figure out where to go home PT Goal Formulation: Patient unable to participate in goal setting Time For Goal Achievement: 03/09/19 Potential to Achieve Goals: Good Progress towards PT goals: Progressing toward goals    Frequency    Min 3X/week  PT Plan Current plan remains appropriate    Co-evaluation              AM-PAC PT "6 Clicks" Mobility   Outcome Measure  Help needed turning from your back to your side while in a flat bed without using bedrails?: None Help needed moving from lying on your back to sitting on the side of a flat bed without using bedrails?: A Little Help needed moving to and from a bed to a chair (including a wheelchair)?: A Little Help needed standing up from a chair using your arms (e.g., wheelchair or bedside chair)?: A Little Help needed to  walk in hospital room?: A Little Help needed climbing 3-5 steps with a railing? : A Lot 6 Click Score: 18    End of Session Equipment Utilized During Treatment: Gait belt Activity Tolerance: Patient tolerated treatment well;Patient limited by fatigue Patient left: in chair;with call bell/phone within reach;with chair alarm set Nurse Communication: Mobility status PT Visit Diagnosis: Unsteadiness on feet (R26.81);Muscle weakness (generalized) (M62.81);Difficulty in walking, not elsewhere classified (R26.2)     Time: 1140-1200 PT Time Calculation (min) (ACUTE ONLY): 20 min  Charges:  $Gait Training: 8-22 mins                     Reinaldo Berber, PT, DPT Acute Rehabilitation Services Pager: [phone] Office: [phone]     Reinaldo Berber 02/28/2019, 12:29 PM

## 2019-02-28 NOTE — TOC Progression Note (Signed)
Transition of Care [facility]) - Progression Note    Patient Details  Name: Rodney Barber MRN: [id number] Date of Birth: 1966/03/30  Transition of Care [facility]) CM/SW [place], LCSW Phone Number: 02/28/2019, 8:58 AM  Clinical Narrative:    CSW sent referral to [facility] for review.      Barriers to Discharge: Continued Medical Work up  Expected Discharge Plan and Services   In-house Referral: Clinical Social Work, Hospice / Palliative Care Discharge Planning Services: NA Post Acute Care Choice: [place] Living arrangements for the past 2 months: Single Family Home                           HH Arranged: NA           Social Determinants of Health (SDOH) Interventions    Readmission Risk Interventions Readmission Risk Prevention Plan 02/26/2019 02/22/2019  Transportation Screening Complete Complete  Medication Review Press photographer) Complete Complete  PCP or Specialist appointment within 3-5 days of discharge Complete Complete  HRI or Home Care Consult Complete Complete  SW Recovery Care/Counseling Consult Complete Complete  Palliative Care Screening Complete Complete  [place] - Not Applicable  Some recent data might be hidden

## 2019-02-28 NOTE — TOC Progression Note (Addendum)
Transition of Care [facility]) - Progression Note    Patient Details  Name: Rodney Barber MRN: [id number] Date of Birth: 06-27-1966  Transition of Care [facility]) CM/SW [place], LCSW Phone Number: 02/28/2019, 4:45 PM  Clinical Narrative:    5:20pm-[facility] requires patient's blood sugars to be under 350 for 24 hours. Charge RN aware.   4:45pm-[facility] reviewing referral. Voluntary form signed and placed on patient's shadow chart if needed by [facility].      Barriers to Discharge: Continued Medical Work up  Expected Discharge Plan and Services   In-house Referral: Clinical Social Work, Hospice / Palliative Care Discharge Planning Services: NA Post Acute Care Choice: [place] Living arrangements for the past 2 months: Single Family Home                           HH Arranged: NA           Social Determinants of Health (SDOH) Interventions    Readmission Risk Interventions Readmission Risk Prevention Plan 02/26/2019 02/22/2019  Transportation Screening Complete Complete  Medication Review Press photographer) Complete Complete  PCP or Specialist appointment within 3-5 days of discharge Complete Complete  HRI or Home Care Consult Complete Complete  SW Recovery Care/Counseling Consult Complete Complete  Palliative Care Screening Complete Complete  [place] - Not Applicable  Some recent data might be hidden

## 2019-02-28 NOTE — Progress Notes (Signed)
[facility]  PROGRESS NOTE  Rodney Barber BJY:[id number] DOB: [birth year] DOA: 02/18/2019 PCP: [name], NP  Brief History    Rodney Barber is a 53 y.o. year old male with medical history significant for uncontrolled insulin-dependent type 2 diabetes, hypertension, chronic pancreatitis with history of chronic biliary stenosis, tobacco use, alcohol use, and substance use disorder  who presented on 02/18/2019 with persistent nausea and vomiting and was found to have DKA with elevated LFTs and concern for acetaminophen toxicity no fulminant acute liver failure and acute hepatitis.  A & P  Acute liver failure/hepatitis concerning for acetaminophen toxicity, continues to improve.  LFTs, alkaline phosphatase and INR continue to improve.  Patient completed acetylcysteine course (last dose 7/18)j which seemed to have helped him turn the corner.  Given resolution of encephalopathy related to hepatic derangements and improvement in liver function labs patient has continued supportive care here at [facility] ( previously a candidate for transfer to [facility] when having liver fialiure)  Intentional overdose. Admitted to intention to harm with overdose of tylenol prior to admission.    Psychiatry consulted, and recommended inpatient psych, will resume home Lexapro, bedside sitter  Acute liver failure induced coagulopathy.  INR trending down, peak of 4.4, currently 1.2.  Continue to trend  Acute hepatic/metabolic encephalopathy, much improved.  Elevated ammonia patient with acute liver failure was treated with lactulose.  Alert today and oriented to time and place with no signs of asterixis on exam.  We will continue current lactulose regimen and monitor closely.  Hypokalemia, slightly worse.  Potassium stable at 3.3, replete, mg wnl, closely monitor.  AKI, improving  Presented with AKI with elevated creatinine like related to ATN in setting of liver failure bilirubin toxicity in addition to  volume depletion, improved with IV fluids while patient was n.p.o.  Other etiologies of potential CKD include substance use (cocaine/alcohol use) now improved.  Creatinine remained stable at 1.44, suspect some level of chronic disease kidney disease.  Avoid nephrotoxins, monitor urine output.  No indication for HD.  Normocytic anemia.  Hemoglobin back to baseline 8-9, no signs or symptoms of bleeding, monitor CBC.  Type 2 diabetes, Poorly controlled, A1c 17.7%.  Fasting glucose at goal, blood sugars tend to be more elevated and lunchtime, have added scheduled short-acting insulin with meals, continue sliding scale, Lantus 15 days, closely monitor CBGs   Malnutrition.  Nutrition consulted, continue Ensure supplementation.  Chronic alcohol use.  No signs of withdrawal currently.  Continue clonidine, folic acid and thiamine, as well as multivitamin.    Chronic pancreatitis.  Lipase within normal limits, denies any abdominal pain.  Has outpatient GI follow-up arranged.     DVT prophylaxis: SCDs Code Status: DNR Family Communication: No family at bedside Disposition Plan: Medically stable for discharge,psych recommends inpatient psychiatric admission given admitted SI attempt pending insurance/social work    [facility] education officer: see [email or web address] (further directions at bottom of note if needed) 7PM-7AM contact night coverage as at bottom of note 02/28/2019, 3:30 PM  LOS: 10 days   Consultants   Nephrology, gastroenterology, psychiatry  Procedures  Renal US -    1. 19 mm mid to lower left renal calculus causing mild to moderate dilatation of the upper pole collecting system. 2. Additional smaller, nonobstructing calculi in the lower pole of the left kidney. 3. 10 mm nonobstructing right renal calculus. 4. Borderline increased echogenicity of both kidneys, suggesting the possibility of mild medical renal disease. 5. Mild amount of pericholecystic fluid without significant  change and minimal ascites.  RUQ [place] - 1. Positive for cholelithiasis, borderline to mild gallbladder wall thickening, borderline to mildly dilated CBD, and trace pericholecystic fluid. No sonographic [name] sign was elicited, and there is no intrahepatic biliary dilatation to strongly suggest acute duct obstruction. However, consider the possibility of choledocholithiasis. MRCP versus Nuclear Medicine Hepatobiliary Scan could be valuable in this clinical setting. 2. Nephrolithiasis. No other acute ultrasound finding in the abdomen.  CT head - Non acute   Antibiotics   none  Interval History/Subjective  Continues to do well, no nausea no vomiting, doing well with diet..  Objective   Vitals:  Vitals:   02/28/19 0745 02/28/19 1226  BP: 136/70 135/86  Pulse: 73 77  Resp: 20 20  Temp: 98.7 F (37.1 C) 98 F (36.7 C)  SpO2: 97% 100%    Exam:  Cachectic, chronically ill-appearing male who looks older than stated age, awake Alert, slow in speech, oriented X 3, No new F.N deficits, Normal affect Anicteric sclera Symmetrical Chest wall movement, Good air movement bilaterally, CTAB +ve B.Sounds, Abd Soft, No tenderness, No organomegaly appriciated, No rebound - guarding or rigidity. No asterixis, alert, oriented to place, context, no appreciable focal deficits   I have personally reviewed the following:   Data Reviewed: Basic Metabolic Panel: Recent Labs  Lab 02/24/19 0243 02/25/19 0435 02/26/19 0334 02/27/19 0227 02/28/19 0350  NA 138 140 139 138 136  K 3.8 3.2* 3.5 3.2* 3.3*  CL 112* 111 111 108 107  CO2 19* 23 22 22 23   GLUCOSE 172* 225* 190* 81 132*  BUN 21* 21* 26* 26* 22*  CREATININE 1.44* 1.55* 1.46* 1.39* 1.44*  CALCIUM 7.6* 7.4* 7.8* 8.1* 7.9*  MG 1.8 1.7 1.7 1.8 1.7  PHOS 1.0* 3.4  --   --   --    Liver Function Tests: Recent Labs  Lab 02/24/19 0243 02/25/19 0435 02/26/19 0334 02/27/19 0227 02/28/19 0350  AST 108* 74* 73* 61* 48*  ALT 547* 359*  273* 207* 167*  ALKPHOS 430* 528* 504* 425* 382*  BILITOT 4.3* 2.8* 1.7* 1.4* 1.4*  PROT 4.4* 4.5* 4.8* 4.5* 4.7*  ALBUMIN 2.0* 1.9* 1.8* 1.8* 1.8*   No results for input(s): LIPASE, AMYLASE in the last 168 hours. Recent Labs  Lab 02/23/19 0341 02/24/19 0243 02/25/19 0457 02/26/19 0334 02/27/19 0227  AMMONIA 28 31 40* 40* 39*   CBC: Recent Labs  Lab 02/24/19 0243 02/25/19 0435 02/26/19 0334 02/27/19 0227 02/28/19 0350  WBC 10.1 10.3 10.1 8.5 6.8  HGB 9.5* 9.0* 8.6* 7.9* 8.2*  HCT 27.9* 27.0* 26.0* 24.4* 25.7*  MCV 87.7 89.4 88.7 91.0 92.4  PLT 90* 101* 96* 117* 149*   Cardiac Enzymes: No results for input(s): CKTOTAL, CKMB, CKMBINDEX, TROPONINI in the last 168 hours. BNP (last 3 results) No results for input(s): BNP in the last 8760 hours.  ProBNP (last 3 results) No results for input(s): PROBNP in the last 8760 hours.  CBG: Recent Labs  Lab 02/27/19 2028 02/27/19 2351 02/28/19 0342 02/28/19 0744 02/28/19 1223  GLUCAP 201* 165* 133* 149* 193*    Recent Results (from the past 240 hour(s))  SARS Coronavirus 2 (CEPHEID - Performed in [facility] hospital lab), Hosp Order     Status: None   Collection Time: 02/18/19 10:38 PM   Specimen: Nasopharyngeal Swab  Result Value Ref Range Status   SARS Coronavirus 2 NEGATIVE NEGATIVE Final    Comment: (NOTE) If result is NEGATIVE SARS-CoV-2 target nucleic acids are  NOT DETECTED. The SARS-CoV-2 RNA is generally detectable in upper and lower  respiratory specimens during the acute phase of infection. The lowest  concentration of SARS-CoV-2 viral copies this assay can detect is 250  copies / mL. A negative result does not preclude SARS-CoV-2 infection  and should not be used as the sole basis for treatment or other  patient management decisions.  A negative result may occur with  improper specimen collection / handling, submission of specimen other  than nasopharyngeal swab, presence of viral mutation(s) within the    areas targeted by this assay, and inadequate number of viral copies  (<250 copies / mL). A negative result must be combined with clinical  observations, patient history, and epidemiological information. If result is POSITIVE SARS-CoV-2 target nucleic acids are DETECTED. The SARS-CoV-2 RNA is generally detectable in upper and lower  respiratory specimens dur ing the acute phase of infection.  Positive  results are indicative of active infection with SARS-CoV-2.  Clinical  correlation with patient history and other diagnostic information is  necessary to determine patient infection status.  Positive results do  not rule out bacterial infection or co-infection with other viruses. If result is PRESUMPTIVE POSTIVE SARS-CoV-2 nucleic acids MAY BE PRESENT.   A presumptive positive result was obtained on the submitted specimen  and confirmed on repeat testing.  While 2019 novel coronavirus  (SARS-CoV-2) nucleic acids may be present in the submitted sample  additional confirmatory testing may be necessary for epidemiological  and / or clinical management purposes  to differentiate between  SARS-CoV-2 and other Sarbecovirus currently known to infect humans.  If clinically indicated additional testing with an alternate test  methodology [phone]) is advised. The SARS-CoV-2 RNA is generally  detectable in upper and lower respiratory sp ecimens during the acute  phase of infection. The expected result is Negative. Fact Sheet for Patients:  BoilerBrush.com.cy Fact Sheet for Healthcare Providers: https://pope.com/ This test is not yet approved or cleared by the [place] FDA and has been authorized for detection and/or diagnosis of SARS-CoV-2 by FDA under an Emergency Use Authorization (EUA).  This EUA will remain in effect (meaning this test can be used) for the duration of the COVID-19 declaration under Section 564(b)(1) of the Act, 21  U.S.C. section 360bbb-3(b)(1), unless the authorization is terminated or revoked sooner. Performed at [facility], [address], [place], [place] [address]   MRSA PCR Screening     Status: None   Collection Time: 02/19/19  2:50 AM   Specimen: Nasal Mucosa; Nasopharyngeal  Result Value Ref Range Status   MRSA by PCR NEGATIVE NEGATIVE Final    Comment:        The GeneXpert MRSA Assay (FDA approved for NASAL specimens only), is one component of a comprehensive MRSA colonization surveillance program. It is not intended to diagnose MRSA infection nor to guide or monitor treatment for MRSA infections. Performed at [facility], [address], [place], [place] [address]      Studies: No results found.  Scheduled Meds:  chlorhexidine  15 mL Mouth Rinse BID   cloNIDine  0.2 mg Transdermal Q Sat   escitalopram  5 mg Oral Daily   feeding supplement (GLUCERNA SHAKE)  237 mL Oral TID BM   folic acid  1 mg Oral Daily   insulin aspart  0-9 Units Subcutaneous TID WC   insulin aspart  3 Units Subcutaneous TID WC   insulin glargine  15 Units Subcutaneous Daily  lactulose  20 g Oral BID   mouth rinse  15 mL Mouth Rinse q12n4p   multivitamin with minerals  1 tablet Oral Daily   nicotine  14 mg Transdermal Daily   thiamine  100 mg Oral Daily   Continuous Infusions:  Principal Problem:   Suicide attempt (HCC) Active Problems:   Chronic pancreatitis (HCC)   Hypokalemia   AKI (acute kidney injury) (HCC)   Shock liver   Hyperosmolar non-ketotic state in patient with type 2 diabetes mellitus (HCC)   Alcohol use   Substance use disorder   Goals of care, counseling/discussion   Palliative care by specialist   Acetaminophen toxicity      Laverna Peace  [facility]

## 2019-02-28 NOTE — Progress Notes (Addendum)
Hypoglycemic Event  CBG: 49  Treatment: 8 oz juice/soda  Symptoms: None  Follow-up CBG: Time:1619 CBG Result:56 Follow-up CBG Time 1630  CBG 75  Possible Reasons for Event: Unknown  Comments/MD notified:Will text     Rodney Barber [name]

## 2019-03-01 DIAGNOSIS — K7200 Acute and subacute hepatic failure without coma: Secondary | ICD-10-CM

## 2019-03-01 LAB — COMPREHENSIVE METABOLIC PANEL
ALT: 139 U/L — ABNORMAL HIGH (ref 0–44)
AST: 45 U/L — ABNORMAL HIGH (ref 15–41)
Albumin: 1.9 g/dL — ABNORMAL LOW (ref 3.5–5.0)
Alkaline Phosphatase: 367 U/L — ABNORMAL HIGH (ref 38–126)
Anion gap: 8 (ref 5–15)
BUN: 15 mg/dL (ref 6–20)
CO2: 21 mmol/L — ABNORMAL LOW (ref 22–32)
Calcium: 8 mg/dL — ABNORMAL LOW (ref 8.9–10.3)
Chloride: 106 mmol/L (ref 98–111)
Creatinine, Ser: 1.32 mg/dL — ABNORMAL HIGH (ref 0.61–1.24)
GFR calc Af Amer: >60 mL/min (ref >60)
GFR calc non Af Amer: >60 mL/min (ref >60)
Glucose, Bld: 244 mg/dL — ABNORMAL HIGH (ref 70–99)
Potassium: 3.9 mmol/L (ref 3.5–5.1)
Sodium: 135 mmol/L (ref 135–145)
Total Bilirubin: 1.1 mg/dL (ref 0.3–1.2)
Total Protein: 5.2 g/dL — ABNORMAL LOW (ref 6.5–8.1)

## 2019-03-01 LAB — PROTIME-INR
INR: 1.1 (ref 0.8–1.2)
Prothrombin Time: 13.9 seconds (ref 11.4–15.2)

## 2019-03-01 LAB — GLUCOSE, CAPILLARY
Glucose-Capillary: 167 mg/dL — ABNORMAL HIGH (ref 70–99)
Glucose-Capillary: 179 mg/dL — ABNORMAL HIGH (ref 70–99)
Glucose-Capillary: 217 mg/dL — ABNORMAL HIGH (ref 70–99)
Glucose-Capillary: 274 mg/dL — ABNORMAL HIGH (ref 70–99)
Glucose-Capillary: 46 mg/dL — ABNORMAL LOW (ref 70–99)
Glucose-Capillary: 48 mg/dL — ABNORMAL LOW (ref 70–99)
Glucose-Capillary: 93 mg/dL (ref 70–99)

## 2019-03-01 LAB — CBC
HCT: 25.9 % — ABNORMAL LOW (ref 39.0–52.0)
Hemoglobin: 8.4 g/dL — ABNORMAL LOW (ref 13.0–17.0)
MCH: 29.8 pg (ref 26.0–34.0)
MCHC: 32.4 g/dL (ref 30.0–36.0)
MCV: 91.8 fL (ref 80.0–100.0)
Platelets: 155 10*3/uL (ref 150–400)
RBC: 2.82 MIL/uL — ABNORMAL LOW (ref 4.22–5.81)
RDW: 13.6 % (ref 11.5–15.5)
WBC: 5.4 10*3/uL (ref 4.0–10.5)
nRBC: 0 % (ref 0.0–0.2)

## 2019-03-01 LAB — MAGNESIUM: Magnesium: 1.7 mg/dL (ref 1.7–2.4)

## 2019-03-01 MED ORDER — AMLODIPINE BESYLATE 5 MG PO TABS
5.0000 mg | ORAL_TABLET | Freq: Every day | ORAL | Status: DC
  Administered 2019-03-01 – 2019-03-06 (×6): 5 mg via ORAL
  Filled 2019-03-01 (×6): qty 1

## 2019-03-01 NOTE — Progress Notes (Signed)
[facility]  PROGRESS NOTE  Rodney Barber CRF:[id number] DOB: 1966-01-14 DOA: 02/18/2019 PCP: [name], NP  Brief History    Rodney Barber is a 53 y.o. year old male with medical history significant for uncontrolled insulin-dependent type 2 diabetes, hypertension, chronic pancreatitis with history of chronic biliary stenosis, tobacco use, alcohol use, and substance use disorder  who presented on 02/18/2019 with persistent nausea and vomiting and was found to have DKA with elevated LFTs and concern for acetaminophen toxicity no fulminant acute liver failure and acute hepatitis.  A & P  Acute liver failure/hepatitis concerning for acetaminophen toxicity, continues to improve.  LFTs, alkaline phosphatase and INR continue to improve.  Patient completed acetylcysteine course (last dose 7/18)j which seemed to have helped him turn the corner.  Given resolution of encephalopathy related to hepatic derangements and improvement in liver function labs patient has continued supportive care here at [facility] ( previously a candidate for transfer to [facility] when having liver fialiure)  Intentional overdose. Admitted to intention to harm with overdose of tylenol prior to admission.    Psychiatry consulted, and recommended inpatient psych, will resume home Lexapro, bedside sitter  Acute liver failure induced coagulopathy.  INR trending down, peak of 4.4, currently 1.2.  Continue to trend  Acute hepatic/metabolic encephalopathy, much improved.  Elevated ammonia patient with acute liver failure was treated with lactulose.  Alert today and oriented to time and place with no signs of asterixis on exam.  We will continue current lactulose regimen and monitor closely.  Hypokalemia, slightly worse.  Potassium stable at 3.3, replete, mg wnl, closely monitor.  AKI, improving  Presented with AKI with elevated creatinine like related to ATN in setting of liver failure bilirubin toxicity in addition to  volume depletion, improved with IV fluids while patient was n.p.o.  Other etiologies of potential CKD include substance use (cocaine/alcohol use) now improved.  Creatinine remained stable at 1.44, suspect some level of chronic disease kidney disease.  Avoid nephrotoxins, monitor urine output.  No indication for HD.  Normocytic anemia.  Hemoglobin back to baseline 8-9, no signs or symptoms of bleeding, monitor CBC.  Type 2 diabetes, Poorly controlled, A1c 17.7%.  Fasting glucose at goal, blood sugars tend to be more elevated at lunchtime,better with scheduled short-acting insulin with meals as long as eating greater than 50% of his meals, continue sliding scale, Lantus 15 days, closely monitor CBGs   Malnutrition.  Nutrition consulted, continue Ensure supplementation.  Chronic alcohol use.  No signs of withdrawal currently.  Continue clonidine, folic acid and thiamine, as well as multivitamin.    Chronic pancreatitis.  Lipase within normal limits, denies any abdominal pain.  Has outpatient GI follow-up arranged.     DVT prophylaxis: SCDs Code Status: DNR Family Communication: No family at bedside Disposition Plan: Medically stable for discharge,psych recommends inpatient psychiatric admission given admitted SI attempt pending insurance/social work--awaiting clearance they want to see his blood sugars remain less than 350 over the next 24 hours    [facility] Direct contact: see [email or web address] (further directions at bottom of note if needed) 7PM-7AM contact night coverage as at bottom of note 03/01/2019, 6:33 PM  LOS: 11 days   Consultants  . Nephrology, gastroenterology, psychiatry  Procedures  Renal US -    1. 19 mm mid to lower left renal calculus causing mild to moderate dilatation of the upper pole collecting system. 2. Additional smaller, nonobstructing calculi in the lower pole of the left kidney. 3. 10 mm nonobstructing  right renal calculus. 4. Borderline increased  echogenicity of both kidneys, suggesting the possibility of mild medical renal disease. 5. Mild amount of pericholecystic fluid without significant change and minimal ascites.  RUQ [place] - 1. Positive for cholelithiasis, borderline to mild gallbladder wall thickening, borderline to mildly dilated CBD, and trace pericholecystic fluid. No sonographic Percell Miller sign was elicited, and there is no intrahepatic biliary dilatation to strongly suggest acute duct obstruction. However, consider the possibility of choledocholithiasis. MRCP versus Nuclear Medicine Hepatobiliary Scan could be valuable in this clinical setting. 2. Nephrolithiasis. No other acute ultrasound finding in the abdomen.  CT head - Non acute   Antibiotics  . none  Interval History/Subjective  Continues to do well, no nausea no vomiting, doing well with diet..  Objective   Vitals:  Vitals:   03/01/19 1148 03/01/19 1603  BP: (!) 147/86 (!) 149/78  Pulse: 71 72  Resp: 18 18  Temp: 98.6 F (37 C) 98 F (36.7 C)  SpO2: 100% 100%    Exam:  Cachectic, chronically ill-appearing male who looks older than stated age, awake Alert, slow in speech, oriented X 3, No new F.N deficits, Normal affect Anicteric sclera Symmetrical Chest wall movement, Good air movement bilaterally, CTAB +ve B.Sounds, Abd Soft, No tenderness, No organomegaly appriciated, No rebound - guarding or rigidity. No asterixis, alert, oriented to place, context, no appreciable focal deficits   I have personally reviewed the following:   Data Reviewed: Basic Metabolic Panel: Recent Labs  Lab 02/24/19 0243 02/25/19 0435 02/26/19 0334 02/27/19 0227 02/28/19 0350 03/01/19 0338  NA 138 140 139 138 136 135  K 3.8 3.2* 3.5 3.2* 3.3* 3.9  CL 112* 111 111 108 107 106  CO2 19* 23 22 22 23  21*  GLUCOSE 172* 225* 190* 81 132* 244*  BUN 21* 21* 26* 26* 22* 15  CREATININE 1.44* 1.55* 1.46* 1.39* 1.44* 1.32*  CALCIUM 7.6* 7.4* 7.8* 8.1* 7.9* 8.0*  MG 1.8 1.7  1.7 1.8 1.7 1.7  PHOS 1.0* 3.4  --   --   --   --    Liver Function Tests: Recent Labs  Lab 02/25/19 0435 02/26/19 0334 02/27/19 0227 02/28/19 0350 03/01/19 0338  AST 74* 73* 61* 48* 45*  ALT 359* 273* 207* 167* 139*  ALKPHOS 528* 504* 425* 382* 367*  BILITOT 2.8* 1.7* 1.4* 1.4* 1.1  PROT 4.5* 4.8* 4.5* 4.7* 5.2*  ALBUMIN 1.9* 1.8* 1.8* 1.8* 1.9*   No results for input(s): LIPASE, AMYLASE in the last 168 hours. Recent Labs  Lab 02/23/19 0341 02/24/19 0243 02/25/19 0457 02/26/19 0334 02/27/19 0227  AMMONIA 28 31 40* 40* 39*   CBC: Recent Labs  Lab 02/25/19 0435 02/26/19 0334 02/27/19 0227 02/28/19 0350 03/01/19 0338  WBC 10.3 10.1 8.5 6.8 5.4  HGB 9.0* 8.6* 7.9* 8.2* 8.4*  HCT 27.0* 26.0* 24.4* 25.7* 25.9*  MCV 89.4 88.7 91.0 92.4 91.8  PLT 101* 96* 117* 149* 155   Cardiac Enzymes: No results for input(s): CKTOTAL, CKMB, CKMBINDEX, TROPONINI in the last 168 hours. BNP (last 3 results) No results for input(s): BNP in the last 8760 hours.  ProBNP (last 3 results) No results for input(s): PROBNP in the last 8760 hours.  CBG: Recent Labs  Lab 02/28/19 2139 03/01/19 0442 03/01/19 0743 03/01/19 1145 03/01/19 1606  GLUCAP 158* 217* 167* 274* 179*    No results found for this or any previous visit (from the past 240 hour(s)).   Studies: No results found.  Scheduled Meds: .  amLODipine  5 mg Oral Daily  . chlorhexidine  15 mL Mouth Rinse BID  . cloNIDine  0.2 mg Transdermal Q Sat  . escitalopram  5 mg Oral Daily  . feeding supplement (GLUCERNA SHAKE)  237 mL Oral TID BM  . folic acid  1 mg Oral Daily  . insulin aspart  0-9 Units Subcutaneous TID WC  . insulin aspart  3 Units Subcutaneous TID WC  . insulin glargine  15 Units Subcutaneous Daily  . lactulose  20 g Oral BID  . mouth rinse  15 mL Mouth Rinse q12n4p  . multivitamin with minerals  1 tablet Oral Daily  . nicotine  14 mg Transdermal Daily  . thiamine  100 mg Oral Daily   Continuous  Infusions:  Principal Problem:   Suicide attempt [facility](HCC) Active Problems:   Chronic pancreatitis (HCC)   Hypokalemia   AKI (acute kidney injury) (HCC)   Shock liver   Hyperosmolar non-ketotic state in patient with type 2 diabetes mellitus (HCC)   Alcohol use   Substance use disorder   Goals of care, counseling/discussion   Palliative care by specialist   Acetaminophen toxicity      Laverna PeaceShayla D Nettey  [facility]

## 2019-03-02 LAB — GLUCOSE, CAPILLARY
Glucose-Capillary: 33 mg/dL — CL (ref 70–99)
Glucose-Capillary: 107 mg/dL — ABNORMAL HIGH (ref 70–99)
Glucose-Capillary: 161 mg/dL — ABNORMAL HIGH (ref 70–99)
Glucose-Capillary: 210 mg/dL — ABNORMAL HIGH (ref 70–99)
Glucose-Capillary: 71 mg/dL (ref 70–99)

## 2019-03-02 MED ORDER — DEXTROSE 5 % IV SOLN: INTRAVENOUS | Status: DC

## 2019-03-02 MED ORDER — INSULIN GLARGINE 100 UNIT/ML ~~LOC~~ SOLN
10.0000 [IU] | Freq: Every day | SUBCUTANEOUS | Status: DC
  Administered 2019-03-02: 10 [IU] via SUBCUTANEOUS
  Filled 2019-03-02 (×2): qty 0.1

## 2019-03-02 NOTE — Progress Notes (Signed)
Pt CBG was 33 after 20oz of Orange juice CBG is up to 107 currently. Pt was also giving a protein shake to drink as well. Spoke with Dr he agreed to hold off on 5% dextrose for now. However if needed can page Dr again. Will continue to monitor.

## 2019-03-02 NOTE — Progress Notes (Addendum)
Hypoglycemic Event  CBG:33  Treatment:8 oz juice/soda  Symptoms:None  Follow-up CBG: Time:0434CBG Result: 107   Possible Reasons for Event:Unknown  Comments/MD notified:Will text

## 2019-03-02 NOTE — Plan of Care (Signed)
  Problem: Health Behavior/Discharge Planning: Goal: Ability to manage health-related needs will improve Outcome: Progressing   Problem: Clinical Measurements: Goal: Ability to maintain clinical measurements within normal limits will improve Outcome: Progressing Goal: Will remain free from infection Outcome: Progressing Goal: Diagnostic test results will improve Outcome: Progressing   Problem: Coping: Goal: Level of anxiety will decrease Outcome: Progressing   Problem: Safety: Goal: Ability to remain free from injury will improve Outcome: Progressing   

## 2019-03-02 NOTE — Progress Notes (Signed)
[facility]  PROGRESS NOTE  Jase Volkov ZOX:[id number] DOB: Dec 31, 1965 DOA: 02/18/2019 PCP: [name], NP  Brief History    Izen Laplume is a 53 y.o. year old male with medical history significant for uncontrolled insulin-dependent type 2 diabetes, hypertension, chronic pancreatitis with history of chronic biliary stenosis, tobacco use, alcohol use, and substance use disorder  who presented on 02/18/2019 with persistent nausea and vomiting and was found to have DKA with elevated LFTs and concern for acetaminophen toxicity no fulminant acute liver failure and acute hepatitis.  A & P  Acute liver failure/hepatitis concerning for acetaminophen toxicity, continues to improve.  LFTs, alkaline phosphatase and INR continue to improve.  Patient completed acetylcysteine course (last dose 7/18)j which seemed to have helped him turn the corner.  Given resolution of encephalopathy related to hepatic derangements and improvement in liver function labs patient has continued supportive care here at [facility] ( previously a candidate for transfer to [facility] when having liver fialiure)  Intentional overdose. Admitted to intention to harm with overdose of tylenol prior to admission.    Psychiatry consulted, and recommended inpatient psych, will resume home Lexapro, bedside sitter  Acute liver failure induced coagulopathy.  INR trending down, peak of 4.4, currently 1.2.  Continue to trend  Acute hepatic/metabolic encephalopathy, much improved.  Elevated ammonia patient with acute liver failure was treated with lactulose.  Alert today and oriented to time and place with no signs of asterixis on exam.  We will continue current lactulose regimen and monitor closely.  Hypokalemia, stable.  Potassium stable at 3.9, replete, mg wnl, closely monitor.  AKI, improving  Presented with AKI with elevated creatinine like related to ATN in setting of liver failure bilirubin toxicity in addition to volume  depletion, improved with IV fluids while patient was n.p.o.  Other etiologies of potential CKD include substance use (cocaine/alcohol use) now improved.  Creatinine remained stable at 1.3-1.4, suspect some level of chronic disease kidney disease.  Avoid nephrotoxins, monitor urine output.  No indication for HD.  Normocytic anemia.  Hemoglobin back to baseline 8-9, no signs or symptoms of bleeding, monitor CBC.  Type 2 diabetes, Poorly controlled, A1c 17.7% complicated by hypoglycemic episodes in hospital. Patient has diminished appetite.  Will discontinue scheduled mealtime insulin and continue reduced dose of Lantus to 10 units sliding scale as needed, closely monitor needs to monitor additional 24 hours before potential transfer to inpatient psych.   Malnutrition.  Nutrition consulted, continue Ensure supplementation.  Chronic alcohol use.  No signs of withdrawal currently.  Continue clonidine, folic acid and thiamine, as well as multivitamin.    Chronic pancreatitis.  Lipase within normal limits, denies any abdominal pain.  Has outpatient GI follow-up arranged.     DVT prophylaxis: SCDs Code Status: DNR Family Communication: No family at bedside Disposition Plan: Medically stable for discharge,psych recommends inpatient psychiatric admission given admitted SI attempt pending insurance/social work--awaiting clearance they want to see his blood sugars remain less than 350 over the next 24 hours    [facility] Direct contact: see [email or web address] (further directions at bottom of note if needed) 7PM-7AM contact night coverage as at bottom of note 03/02/2019, 4:18 PM  LOS: 12 days   Consultants  . Nephrology, gastroenterology, psychiatry  Procedures  Renal US -    1. 19 mm mid to lower left renal calculus causing mild to moderate dilatation of the upper pole collecting system. 2. Additional smaller, nonobstructing calculi in the lower pole of the left kidney. 3.  10 mm nonobstructing  right renal calculus. 4. Borderline increased echogenicity of both kidneys, suggesting the possibility of mild medical renal disease. 5. Mild amount of pericholecystic fluid without significant change and minimal ascites.  RUQ [place] - 1. Positive for cholelithiasis, borderline to mild gallbladder wall thickening, borderline to mildly dilated CBD, and trace pericholecystic fluid. No sonographic [name] sign was elicited, and there is no intrahepatic biliary dilatation to strongly suggest acute duct obstruction. However, consider the possibility of choledocholithiasis. MRCP versus Nuclear Medicine Hepatobiliary Scan could be valuable in this clinical setting. 2. Nephrolithiasis. No other acute ultrasound finding in the abdomen.  CT head - Non acute   Antibiotics  . none  Interval History/Subjective  Blood glucose measured 230s overnight responded well to oral shoes.  Has no acute complaints.  States he is eating okay.  Objective   Vitals:  Vitals:   03/02/19 0354 03/02/19 1232  BP: (!) 162/77 135/75  Pulse: 74 70  Resp: 19 18  Temp: 97.6 F (36.4 C) 98.7 F (37.1 C)  SpO2: 100% 100%    Exam:  Cachectic, chronically ill-appearing male who looks older than stated age, awake Alert, slow in speech, oriented X 3, No new F.N deficits, Normal affect Anicteric sclera Symmetrical Chest wall movement, Good air movement bilaterally, CTAB +ve B.Sounds, Abd Soft, No tenderness, No organomegaly appriciated, No rebound - guarding or rigidity. No asterixis, no appreciable focal deficits   I have personally reviewed the following:   Data Reviewed: Basic Metabolic Panel: Recent Labs  Lab 02/24/19 0243 02/25/19 0435 02/26/19 0334 02/27/19 0227 02/28/19 0350 03/01/19 0338  NA 138 140 139 138 136 135  K 3.8 3.2* 3.5 3.2* 3.3* 3.9  CL 112* 111 111 108 107 106  CO2 19* 23 22 22 23  21*  GLUCOSE 172* 225* 190* 81 132* 244*  BUN 21* 21* 26* 26* 22* 15  CREATININE 1.44* 1.55* 1.46* 1.39*  1.44* 1.32*  CALCIUM 7.6* 7.4* 7.8* 8.1* 7.9* 8.0*  MG 1.8 1.7 1.7 1.8 1.7 1.7  PHOS 1.0* 3.4  --   --   --   --    Liver Function Tests: Recent Labs  Lab 02/25/19 0435 02/26/19 0334 02/27/19 0227 02/28/19 0350 03/01/19 0338  AST 74* 73* 61* 48* 45*  ALT 359* 273* 207* 167* 139*  ALKPHOS 528* 504* 425* 382* 367*  BILITOT 2.8* 1.7* 1.4* 1.4* 1.1  PROT 4.5* 4.8* 4.5* 4.7* 5.2*  ALBUMIN 1.9* 1.8* 1.8* 1.8* 1.9*   No results for input(s): LIPASE, AMYLASE in the last 168 hours. Recent Labs  Lab 02/24/19 0243 02/25/19 0457 02/26/19 0334 02/27/19 0227  AMMONIA 31 40* 40* 39*   CBC: Recent Labs  Lab 02/25/19 0435 02/26/19 0334 02/27/19 0227 02/28/19 0350 03/01/19 0338  WBC 10.3 10.1 8.5 6.8 5.4  HGB 9.0* 8.6* 7.9* 8.2* 8.4*  HCT 27.0* 26.0* 24.4* 25.7* 25.9*  MCV 89.4 88.7 91.0 92.4 91.8  PLT 101* 96* 117* 149* 155   Cardiac Enzymes: No results for input(s): CKTOTAL, CKMB, CKMBINDEX, TROPONINI in the last 168 hours. BNP (last 3 results) No results for input(s): BNP in the last 8760 hours.  ProBNP (last 3 results) No results for input(s): PROBNP in the last 8760 hours.  CBG: Recent Labs  Lab 03/01/19 2341 03/02/19 0350 03/02/19 0434 03/02/19 0755 03/02/19 1218  GLUCAP 93 33* 107* 161* 210*    No results found for this or any previous visit (from the past 240 hour(s)).   Studies: No results found.  Scheduled Meds: . amLODipine  5 mg Oral Daily  . chlorhexidine  15 mL Mouth Rinse BID  . cloNIDine  0.2 mg Transdermal Q Sat  . escitalopram  5 mg Oral Daily  . feeding supplement (GLUCERNA SHAKE)  237 mL Oral TID BM  . folic acid  1 mg Oral Daily  . insulin aspart  0-9 Units Subcutaneous TID WC  . insulin glargine  10 Units Subcutaneous Daily  . lactulose  20 g Oral BID  . mouth rinse  15 mL Mouth Rinse q12n4p  . multivitamin with minerals  1 tablet Oral Daily  . nicotine  14 mg Transdermal Daily  . thiamine  100 mg Oral Daily   Continuous Infusions:   Principal Problem:   Suicide attempt [facility]) Active Problems:   Chronic pancreatitis (HCC)   Hypokalemia   AKI (acute kidney injury) (HCC)   Shock liver   Hyperosmolar non-ketotic state in patient with type 2 diabetes mellitus (HCC)   Alcohol use   Substance use disorder   Goals of care, counseling/discussion   Palliative care by specialist   Acetaminophen toxicity      Laverna Peace  [facility]

## 2019-03-02 NOTE — Progress Notes (Addendum)
Hypoglycemic Event  CBG: 46  Treatment: 8 oz juice/soda  Symptoms: None  Follow-up CBG: Time: 2236    CBG Result: 48 Follow-up CBG Time 2341  CBG Result: 93    Possible Reasons for Event: Unknown  Comments/MD notified:Will text

## 2019-03-02 NOTE — Progress Notes (Signed)
Pt has denied suicidal thoughts today.

## 2019-03-03 DIAGNOSIS — E1169 Type 2 diabetes mellitus with other specified complication: Secondary | ICD-10-CM

## 2019-03-03 DIAGNOSIS — Z794 Long term (current) use of insulin: Secondary | ICD-10-CM

## 2019-03-03 LAB — GLUCOSE, CAPILLARY
Glucose-Capillary: 153 mg/dL — ABNORMAL HIGH (ref 70–99)
Glucose-Capillary: 183 mg/dL — ABNORMAL HIGH (ref 70–99)
Glucose-Capillary: 199 mg/dL — ABNORMAL HIGH (ref 70–99)
Glucose-Capillary: 252 mg/dL — ABNORMAL HIGH (ref 70–99)
Glucose-Capillary: 29 mg/dL — CL (ref 70–99)
Glucose-Capillary: 307 mg/dL — ABNORMAL HIGH (ref 70–99)
Glucose-Capillary: 336 mg/dL — ABNORMAL HIGH (ref 70–99)
Glucose-Capillary: 381 mg/dL — ABNORMAL HIGH (ref 70–99)

## 2019-03-03 MED ORDER — DEXTROSE 50 % IV SOLN
INTRAVENOUS | Status: AC
  Administered 2019-03-03: 50 mL
  Filled 2019-03-03: qty 50

## 2019-03-03 NOTE — Progress Notes (Signed)
Physical Therapy Treatment Patient Details Name: Rodney Barber MRN: [id number] DOB: 07-06-1966 Today's Date: 03/03/2019    History of Present Illness 53 y.o. male with medical history significant for uncontrolled insulin-dependent type 2 diabetes, hypertension, chronic pancreatitis with history of chronic biliary stenosis, tobacco use, alcohol use, and substance use disorder who presents to the ED for evaluation of persistent nausea and vomiting.    PT Comments    Pt progressing towards goals. Practiced gait without AD this session, however, pt continuing to require at least 1 UE support and min to min guard A. Continues to present with cognitive deficits. Per notes, pt likely to go to [facility] after d/c. Will continue to follow acutely to maximize functional mobility independence and safety.    Follow Up Recommendations  SNF(vs. [facility])     Equipment Recommendations  None recommended by PT    Recommendations for Other Services       Precautions / Restrictions Precautions Precautions: Fall Precaution Comments: sitter present. Suicide precautions Restrictions Weight Bearing Restrictions: No    Mobility  Bed Mobility Overal bed mobility: Needs Assistance Bed Mobility: Supine to Sit;Sit to Supine     Supine to sit: Supervision Sit to supine: Supervision   General bed mobility comments: Supervision for safety.   Transfers Overall transfer level: Needs assistance Equipment used: 1 person hand held assist Transfers: Sit to/from Stand Sit to Stand: Min assist         General transfer comment: Min A for lift assist and steadying assist.   Ambulation/Gait Ambulation/Gait assistance: Min assist Gait Distance (Feet): 100 Feet Assistive device: 1 person hand held assist Gait Pattern/deviations: Step-through pattern;Decreased stride length;Drifts right/left;Narrow base of support Gait velocity: Decreased   General Gait Details: Slow, unsteady gait. Requiring HHA and min A for  steadying assist. Noted 1 LOB during gait.    Stairs             Wheelchair Mobility    Modified Rankin (Stroke Patients Only)       Balance Overall balance assessment: Needs assistance Sitting-balance support: Feet supported Sitting balance-Leahy Scale: Good     Standing balance support: Single extremity supported;No upper extremity supported;During functional activity Standing balance-Leahy Scale: Fair                              Cognition Arousal/Alertness: Awake/alert Behavior During Therapy: Flat affect Overall Cognitive Status: Impaired/Different from baseline Area of Impairment: Awareness;Problem solving;Safety/judgement                         Safety/Judgement: Decreased awareness of safety;Decreased awareness of deficits Awareness: Emergent Problem Solving: Slow processing;Requires verbal cues General Comments: Pt with slow processing and requiring increased time to follow cues.       Exercises      General Comments        Pertinent Vitals/Pain Pain Assessment: No/denies pain    Home Living                      Prior Function            PT Goals (current goals can now be found in the care plan section) Acute Rehab PT Goals Patient Stated Goal: to be walking PT Goal Formulation: With patient Time For Goal Achievement: 03/09/19 Potential to Achieve Goals: Good Progress towards PT goals: Progressing toward goals    Frequency    Min 3X/week  PT Plan Current plan remains appropriate    Co-evaluation              AM-PAC PT "6 Clicks" Mobility   Outcome Measure  Help needed turning from your back to your side while in a flat bed without using bedrails?: None Help needed moving from lying on your back to sitting on the side of a flat bed without using bedrails?: A Little Help needed moving to and from a bed to a chair (including a wheelchair)?: A Little Help needed standing up from a chair  using your arms (e.g., wheelchair or bedside chair)?: A Little Help needed to walk in hospital room?: A Little Help needed climbing 3-5 steps with a railing? : A Lot 6 Click Score: 18    End of Session Equipment Utilized During Treatment: Gait belt Activity Tolerance: Patient tolerated treatment well;Patient limited by fatigue Patient left: in bed;with call bell/phone within reach;with nursing/sitter in room Nurse Communication: Mobility status PT Visit Diagnosis: Unsteadiness on feet (R26.81);Muscle weakness (generalized) (M62.81);Difficulty in walking, not elsewhere classified (R26.2)     Time: 3664-4034 PT Time Calculation (min) (ACUTE ONLY): 15 min  Charges:  $Gait Training: 8-22 mins                     Leighton Ruff, PT, DPT  Acute Rehabilitation Services  Pager: [phone] Office: [phone]    Rudean Hitt 03/03/2019, 4:15 PM

## 2019-03-03 NOTE — Progress Notes (Signed)
[facility]  PROGRESS NOTE  Alie Morton WUJ:[id number] DOB: November 12, 1965 DOA: 02/18/2019 PCP: [name], NP  Brief History    Rodney Barber is a 53 y.o. year old male with medical history significant for uncontrolled insulin-dependent type 2 diabetes, hypertension, chronic pancreatitis with history of chronic biliary stenosis, tobacco use, alcohol use, and substance use disorder  who presented on 02/18/2019 with persistent nausea and vomiting and was found to have DKA with elevated LFTs and concern for acetaminophen toxicity no fulminant acute liver failure and acute hepatitis.  A & P  Acute liver failure/hepatitis concerning for acetaminophen toxicity, continues to improve.  LFTs, alkaline phosphatase and INR continue to improve.  Patient completed acetylcysteine course (last dose 7/18)j which seemed to have helped him turn the corner.  Given resolution of encephalopathy related to hepatic derangements and improvement in liver function labs patient has continued supportive care here at [facility] ( previously a candidate for transfer to [facility] when having liver fialiure)  Intentional overdose. Admitted to intention to harm with overdose of tylenol prior to admission.    Psychiatry consulted, and recommended inpatient psych, will resume home Lexapro, bedside sitter  Acute liver failure induced coagulopathy.  INR trending down, peak of 4.4, currently 1.2.  Continue to trend  Acute hepatic/metabolic encephalopathy, much improved.  Elevated ammonia patient with acute liver failure was treated with lactulose.  Alert today and oriented to time and place with no signs of asterixis on exam.  We will continue current lactulose regimen and monitor closely.  Hypokalemia, stable.  Potassium stable at 3.9, replete, mg wnl, closely monitor.  AKI, improving  Presented with AKI with elevated creatinine like related to ATN in setting of liver failure bilirubin toxicity in addition to volume  depletion, improved with IV fluids while patient was n.p.o.  Other etiologies of potential CKD include substance use (cocaine/alcohol use) now improved.  Creatinine remained stable at 1.3-1.4, suspect some level of chronic disease kidney disease.  Avoid nephrotoxins, monitor urine output.  No indication for HD.  Normocytic anemia.  Hemoglobin back to baseline 8-9, no signs or symptoms of bleeding, monitor CBC.  Type 2 diabetes, Poorly controlled, A1c 17.7% complicated by hypoglycemic episodes in hospital. Given his recovering liver function I suspect his hypoglycemia is driven by depleted glycogen storage.  Had an episode of hypoglycemia 29 overnight.  Current FBG is 183.  Will discontinue Lantus only continue sliding scale insulin as needed.  Patient had another hypoglycemic episode despite having normal meal consumption to 33.  Will closely monitor if remains without hypoglycemic episodes over next 24 hours should be safe medically for inpatient psych .   Malnutrition.  Nutrition consulted, continue Ensure supplementation.  Chronic alcohol use.  No signs of withdrawal currently.  Continue clonidine, folic acid and thiamine, as well as multivitamin.    Chronic pancreatitis.  Lipase within normal limits, denies any abdominal pain.  Has outpatient GI follow-up arranged.     DVT prophylaxis: SCDs Code Status: DNR Family Communication: No family at bedside Disposition Plan: if remains without hypoglycemic episodes over next 24 hours should be safe medically for inpatient psych, ipsych recommends inpatient psychiatric admission given admitted SI attempt pending insurance/social work--awaiting medical stability particularly with his blood sugar   [facility] Direct contact: see [email or web address] (further directions at bottom of note if needed) 7PM-7AM contact night coverage as at bottom of note 03/03/2019, 12:02 PM  LOS: 13 days   Consultants  . Nephrology, gastroenterology, psychiatry   Procedures  Renal  US -    1. 19 mm mid to lower left renal calculus causing mild to moderate dilatation of the upper pole collecting system. 2. Additional smaller, nonobstructing calculi in the lower pole of the left kidney. 3. 10 mm nonobstructing right renal calculus. 4. Borderline increased echogenicity of both kidneys, suggesting the possibility of mild medical renal disease. 5. Mild amount of pericholecystic fluid without significant change and minimal ascites.  RUQ [place] - 1. Positive for cholelithiasis, borderline to mild gallbladder wall thickening, borderline to mildly dilated CBD, and trace pericholecystic fluid. No sonographic [name] sign was elicited, and there is no intrahepatic biliary dilatation to strongly suggest acute duct obstruction. However, consider the possibility of choledocholithiasis. MRCP versus Nuclear Medicine Hepatobiliary Scan could be valuable in this clinical setting. 2. Nephrolithiasis. No other acute ultrasound finding in the abdomen.  CT head - Non acute   Antibiotics  . none  Interval History/Subjective  Had an episode of blood glucose 33 that responded well to D50. Patient has no complaints.  Eating well.  No abdominal pain.  No nausea or vomiting.  Having normal BMs.  Objective   Vitals:  Vitals:   03/03/19 0837 03/03/19 0947  BP: (!) 149/81 (!) 144/76  Pulse: (!) 57   Resp: 18   Temp: (!) 97.3 F (36.3 C)   SpO2: 100%     Exam:  Cachectic, chronically ill-appearing male who looks older than stated age, awake Alert, slow in speech, oriented X 3, No new F.N deficits, Normal affect Anicteric sclera Symmetrical Chest wall movement, Good air movement bilaterally, CTAB +ve B.Sounds, Abd Soft, No tenderness, No organomegaly appriciated, No rebound - guarding or rigidity. No asterixis, no appreciable focal deficits   I have personally reviewed the following:   Data Reviewed: Basic Metabolic Panel: Recent Labs  Lab 02/25/19 0435  02/26/19 0334 02/27/19 0227 02/28/19 0350 03/01/19 0338  NA 140 139 138 136 135  K 3.2* 3.5 3.2* 3.3* 3.9  CL 111 111 108 107 106  CO2 23 22 22 23  21*  GLUCOSE 225* 190* 81 132* 244*  BUN 21* 26* 26* 22* 15  CREATININE 1.55* 1.46* 1.39* 1.44* 1.32*  CALCIUM 7.4* 7.8* 8.1* 7.9* 8.0*  MG 1.7 1.7 1.8 1.7 1.7  PHOS 3.4  --   --   --   --    Liver Function Tests: Recent Labs  Lab 02/25/19 0435 02/26/19 0334 02/27/19 0227 02/28/19 0350 03/01/19 0338  AST 74* 73* 61* 48* 45*  ALT 359* 273* 207* 167* 139*  ALKPHOS 528* 504* 425* 382* 367*  BILITOT 2.8* 1.7* 1.4* 1.4* 1.1  PROT 4.5* 4.8* 4.5* 4.7* 5.2*  ALBUMIN 1.9* 1.8* 1.8* 1.8* 1.9*   No results for input(s): LIPASE, AMYLASE in the last 168 hours. Recent Labs  Lab 02/25/19 0457 02/26/19 0334 02/27/19 0227  AMMONIA 40* 40* 39*   CBC: Recent Labs  Lab 02/25/19 0435 02/26/19 0334 02/27/19 0227 02/28/19 0350 03/01/19 0338  WBC 10.3 10.1 8.5 6.8 5.4  HGB 9.0* 8.6* 7.9* 8.2* 8.4*  HCT 27.0* 26.0* 24.4* 25.7* 25.9*  MCV 89.4 88.7 91.0 92.4 91.8  PLT 101* 96* 117* 149* 155   Cardiac Enzymes: No results for input(s): CKTOTAL, CKMB, CKMBINDEX, TROPONINI in the last 168 hours. BNP (last 3 results) No results for input(s): BNP in the last 8760 hours.  ProBNP (last 3 results) No results for input(s): PROBNP in the last 8760 hours.  CBG: Recent Labs  Lab 03/02/19 1707 03/03/19 0015 03/03/19 0105 03/03/19  0415 03/03/19 0833  GLUCAP 71 29* 199* 153* 183*    No results found for this or any previous visit (from the past 240 hour(s)).   Studies: No results found.  Scheduled Meds: . amLODipine  5 mg Oral Daily  . chlorhexidine  15 mL Mouth Rinse BID  . cloNIDine  0.2 mg Transdermal Q Sat  . escitalopram  5 mg Oral Daily  . feeding supplement (GLUCERNA SHAKE)  237 mL Oral TID BM  . folic acid  1 mg Oral Daily  . insulin aspart  0-9 Units Subcutaneous TID WC  . lactulose  20 g Oral BID  . mouth rinse  15 mL  Mouth Rinse q12n4p  . multivitamin with minerals  1 tablet Oral Daily  . nicotine  14 mg Transdermal Daily  . thiamine  100 mg Oral Daily   Continuous Infusions:  Principal Problem:   Suicide attempt [facility]) Active Problems:   Chronic pancreatitis (HCC)   Hypokalemia   AKI (acute kidney injury) (HCC)   Shock liver   Hyperosmolar non-ketotic state in patient with type 2 diabetes mellitus (HCC)   Alcohol use   Substance use disorder   Goals of care, counseling/discussion   Palliative care by specialist   Acetaminophen toxicity      Laverna Peace  [facility]

## 2019-03-03 NOTE — TOC Progression Note (Signed)
Transition of Care [facility]) - Progression Note    Patient Details  Name: Rodney Barber MRN: [id number] Date of Birth: June 30, 1966  Transition of Care [facility]) CM/SW [place], [place] Phone Number: 03/03/2019, 9:19 AM  Clinical Narrative:     CSW called and spoke with Romie Minus at [facility]. CSW shared that the patient's blood sugars are stable. She stated that they do not have any beds today but she will see what they can do.   CSW received a return phone call from [place]. She stated that she doesn't see where a psych consult was placed. She asked the CSW if she saw any psych notes. CSW stated that she did not. Romie Minus stated that the patient will need to have a psych consult before [facility] can admit the patient. Romie Minus stated she would call the patient's RN and ask for her to put in the psych consult.   CSW will continue to follow.     Barriers to Discharge: Continued Medical Work up  Expected Discharge Plan and Services   In-house Referral: Clinical Social Work, Hospice / Palliative Care Discharge Planning Services: NA Post Acute Care Choice: [place] Living arrangements for the past 2 months: Single Family Home                           HH Arranged: NA           Social Determinants of Health (SDOH) Interventions    Readmission Risk Interventions Readmission Risk Prevention Plan 02/26/2019 02/22/2019  Transportation Screening Complete Complete  Medication Review Press photographer) Complete Complete  PCP or Specialist appointment within 3-5 days of discharge Complete Complete  HRI or Home Care Consult Complete Complete  SW Recovery Care/Counseling Consult Complete Complete  Palliative Care Screening Complete Complete  [place] - Not Applicable  Some recent data might be hidden

## 2019-03-03 NOTE — Progress Notes (Signed)
Patient CBG went down to 29.  Reported to me by NT.  I went in to assess pt.  He was sleeping but he was easily woken up and arouseable.  No change in mentation or LOC.  I gave him a full cup of [place] Juice and a dose of IV D50.  Return CBG at 100 am was 199.  Pt. States he is "feeling fine".  Will continue to monitor patient.

## 2019-03-03 NOTE — Progress Notes (Signed)
  Inpatient Diabetes Program Recommendations  AACE/ADA: New Consensus Statement on Inpatient Glycemic Control (2015)  Target Ranges:  Prepandial:   less than 140 mg/dL      Peak postprandial:   less than 180 mg/dL (1-2 hours)      Critically ill patients:  140 - 180 mg/dL   Lab Results  Component Value Date   GLUCAP 336 (H) 03/03/2019   HGBA1C 17.7 (H) 02/18/2019    Review of Glycemic Control Results for Rodney Barber, Rodney Barber (MRN [id number]) as of 03/03/2019 16:36  Ref. Range 03/03/2019 01:05 03/03/2019 04:15 03/03/2019 08:33 03/03/2019 12:14 03/03/2019 16:10  Glucose-Capillary Latest Ref Range: 70 - 99 mg/dL 199 (H) 153 (H) 183 (H) 307 (H) 336 (H)   Diabetes history: Type 2 DM Outpatient Diabetes medications: Levemir 25 units QHS, Glipizide 5 mg BID Current orders for Inpatient glycemic control: Novolog 0-9 units tid  Inpatient Diabetes Program Recommendations:   Postprandial CBGs elevated. Please consider: -Novolog 3 units tid meal coverage if eats 50%  Thank you, Bethena Roys E. Linnie Mcglocklin, RN, MSN, CDE  Diabetes Coordinator Inpatient Glycemic Control Team Team Pager [phone] (8am-5pm) 03/03/2019 4:39 PM

## 2019-03-03 NOTE — Progress Notes (Signed)
CSW received call from [facility], [place] regarding patient.  She reports that sugars are under 300 ([facility] threshold) but from brief review of notes it is not clear that patient is medically stable (I.e., CBG's ranging from 33 to 210).   CSW advised that patient should be cleared, and or, stable medically and documented as such by the primary provider.  CSW also advised that an order needs to be put in for a Psychiatry consult versus a TTS consult.  CSW agreed to contact RN on 5W to advise of same.  CSW spoke to [place], [place] and advised same.  Once order submitted patient will be seen by Psychiatrist via telepsych.  Areatha Keas. Judi Cong, MSW, [place] Disposition Clinical Social Work [phone] (cell) [phone] (office)

## 2019-03-04 LAB — GLUCOSE, CAPILLARY
Glucose-Capillary: 182 mg/dL — ABNORMAL HIGH (ref 70–99)
Glucose-Capillary: 229 mg/dL — ABNORMAL HIGH (ref 70–99)
Glucose-Capillary: 235 mg/dL — ABNORMAL HIGH (ref 70–99)
Glucose-Capillary: 235 mg/dL — ABNORMAL HIGH (ref 70–99)
Glucose-Capillary: 282 mg/dL — ABNORMAL HIGH (ref 70–99)
Glucose-Capillary: 316 mg/dL — ABNORMAL HIGH (ref 70–99)

## 2019-03-04 MED ORDER — INSULIN GLARGINE 100 UNIT/ML ~~LOC~~ SOLN
5.0000 [IU] | Freq: Every day | SUBCUTANEOUS | Status: DC
  Administered 2019-03-04: 5 [IU] via SUBCUTANEOUS
  Filled 2019-03-04 (×2): qty 0.05

## 2019-03-04 MED ORDER — INSULIN ASPART 100 UNIT/ML ~~LOC~~ SOLN
3.0000 [IU] | Freq: Three times a day (TID) | SUBCUTANEOUS | Status: DC
  Administered 2019-03-04 – 2019-03-06 (×8): 3 [IU] via SUBCUTANEOUS

## 2019-03-04 NOTE — Progress Notes (Signed)
CSW acknowledges consult for substance abuse resources.  CSW met with pt at bedside.  CSW highlighted resources that could meet the needs pt and insurance.  CSW will continue to follow.   Reed Breech LCSWA [phone]

## 2019-03-04 NOTE — Progress Notes (Signed)
Inpatient Diabetes Program Recommendations  AACE/ADA: New Consensus Statement on Inpatient Glycemic Control (2015)  Target Ranges:  Prepandial:   less than 140 mg/dL      Peak postprandial:   less than 180 mg/dL (1-2 hours)      Critically ill patients:  140 - 180 mg/dL   Lab Results  Component Value Date   GLUCAP 316 (H) 03/04/2019   HGBA1C 17.7 (H) 02/18/2019    Review of Glycemic Control Results for Rodney Barber, Rodney Barber (MRN [id number]) as of 03/04/2019 13:10  Ref. Range 03/03/2019 19:56 03/04/2019 00:46 03/04/2019 04:34 03/04/2019 07:41 03/04/2019 12:28  Glucose-Capillary Latest Ref Range: 70 - 99 mg/dL 252 (H) 282 (H) 235 (H) 235 (H) 316 (H)   Inpatient Diabetes Program Recommendations:   Consider adding back Lantus @ 6 units daily.  Thank you, Nani Gasser. Doninique Lwin, RN, MSN, CDE  Diabetes Coordinator Inpatient Glycemic Control Team Team Pager [phone] (8am-5pm) 03/04/2019 1:11 PM

## 2019-03-04 NOTE — Consult Note (Addendum)
Telepsych Consultation   Reason for Consult:  Reassessment  Referring Physician:  Dr. Roberto Scales Location of Patient: [facility] Location of Provider: [facility]  Patient Identification: Rodney Barber MRN:  [id number] Principal Diagnosis: Suicide attempt [facility]) Diagnosis:  Principal Problem:   Suicide attempt [facility]) Active Problems:   Chronic pancreatitis (HCC)   Hypokalemia   AKI (acute kidney injury) (HCC)   Shock liver   Hyperosmolar non-ketotic state in patient with type 2 diabetes mellitus (HCC)   Alcohol use   Substance use disorder   Goals of care, counseling/discussion   Palliative care by specialist   Acetaminophen toxicity   Total Time spent with patient: 15 minutes  Subjective:   Rodney Barber is a 53 y.o. male patient admitted with Acetaminophen overdose.  HPI:   Per chart review, patient was admitted with acute hepatic and metabolic encephalopathy. He was initially obtunded but improved and was treated with IV Acetylcysteine. He was going to be transferred to [facility] but this was cancelled after he improved. He later admitted to his nurse that he attempted to end his life by overdose. He has a history of substance abuse. UDS was positive for cocaine and BAL was negative on admission. Tylenol level was 43 on admission. Home medications include Lexapro 5 mg daily. He is recommended for SNF placement. He was last seen by the psychiatry consult service on 7/22 and recommended for inpatient psychiatric hospitalization.   On interview, Rodney Barber reports that he is doing okay. He reports an improvement in his mood over the past 3 days. He denies SI since hospitalization.  He reports a history of depression and worsening depressive symptoms after the loss of loved ones. He reports that his mother and brother passed away a few days apart prior to admission and both of their deaths were unexpected. His mother passed away from complications related to  coronavirus. His brother passed away from unknown causes while in a rehab. He has one living older brother. They are close. He denies HI or AVH. He denies problems with sleep or appetite.   Past Psychiatric History: Cocaine and alcohol abuse   Risk to Self: Low. Denies SI.  Risk to Others:   None. Denies HI.  Prior Inpatient Therapy:  Denies  Prior Outpatient Therapy:  Denies   Past Medical History:  Past Medical History:  Diagnosis Date  . Diabetes mellitus   . Hypertension   . Pancreatitis     Past Surgical History:  Procedure Laterality Date  . ERCP  06/27/2011   Procedure: ENDOSCOPIC RETROGRADE CHOLANGIOPANCREATOGRAPHY (ERCP);  Surgeon: Petra Kuba, MD;  Location: [facility];  Service: Endoscopy;  Laterality: N/A;  . ERCP W/ PLASTIC STENT PLACEMENT  03/2011   Family History:  Family History  Problem Relation Age of Onset  . Diabetes Mother   . Diabetes Brother    Family Psychiatric  History: Denies  Social History:  Social History   Substance and Sexual Activity  Alcohol Use No     Social History   Substance and Sexual Activity  Drug Use Yes  . Types: "Crack" cocaine, Other-see comments, Cocaine   Comment: last smoked crack 12-16-16    Social History   Socioeconomic History  . Marital status: Single    Spouse name: Not on file  . Number of children: Not on file  . Years of education: Not on file  . Highest education level: Not on file  Occupational History  . Not on file  Social Needs  .  Financial resource strain: Not on file  . Food insecurity    Worry: Not on file    Inability: Not on file  . Transportation needs    Medical: Not on file    Non-medical: Not on file  Tobacco Use  . Smoking status: Current Every Day Smoker    Packs/day: 0.50    Years: 30.00    Pack years: 15.00    Types: Cigarettes  . Smokeless tobacco: Never Used  . Tobacco comment: 6 cigarett /day  Substance and Sexual Activity  . Alcohol use: No  . Drug use: Yes    Types:  "Crack" cocaine, Other-see comments, Cocaine    Comment: last smoked crack 12-16-16  . Sexual activity: Never  Lifestyle  . Physical activity    Days per week: Not on file    Minutes per session: Not on file  . Stress: Not on file  Relationships  . Social Musicianconnections    Talks on phone: Not on file    Gets together: Not on file    Attends religious service: Not on file    Active member of club or organization: Not on file    Attends meetings of clubs or organizations: Not on file    Relationship status: Not on file  Other Topics Concern  . Not on file  Social History Narrative  . Not on file   Additional Social History: He lives alone. He does not have children and has never never been married. He reports occasional alcohol and cocaine use.      Allergies:  No Known Allergies  Labs:  Results for orders placed or performed during the hospital encounter of 02/18/19 (from the past 48 hour(s))  Glucose, capillary     Status: Abnormal   Collection Time: 03/02/19 12:18 PM  Result Value Ref Range   Glucose-Capillary 210 (H) 70 - 99 mg/dL  Glucose, capillary     Status: None   Collection Time: 03/02/19  5:07 PM  Result Value Ref Range   Glucose-Capillary 71 70 - 99 mg/dL  Glucose, capillary     Status: Abnormal   Collection Time: 03/02/19  8:27 PM  Result Value Ref Range   Glucose-Capillary 381 (H) 70 - 99 mg/dL   Comment 1 Notify RN   Glucose, capillary     Status: Abnormal   Collection Time: 03/03/19 12:15 AM  Result Value Ref Range   Glucose-Capillary 29 (LL) 70 - 99 mg/dL   Comment 1 Notify RN   Glucose, capillary     Status: Abnormal   Collection Time: 03/03/19  1:05 AM  Result Value Ref Range   Glucose-Capillary 199 (H) 70 - 99 mg/dL  Glucose, capillary     Status: Abnormal   Collection Time: 03/03/19  4:15 AM  Result Value Ref Range   Glucose-Capillary 153 (H) 70 - 99 mg/dL  Glucose, capillary     Status: Abnormal   Collection Time: 03/03/19  8:33 AM  Result Value  Ref Range   Glucose-Capillary 183 (H) 70 - 99 mg/dL  Glucose, capillary     Status: Abnormal   Collection Time: 03/03/19 12:14 PM  Result Value Ref Range   Glucose-Capillary 307 (H) 70 - 99 mg/dL  Glucose, capillary     Status: Abnormal   Collection Time: 03/03/19  4:10 PM  Result Value Ref Range   Glucose-Capillary 336 (H) 70 - 99 mg/dL  Glucose, capillary     Status: Abnormal   Collection Time: 03/03/19  7:56 PM  Result Value Ref Range   Glucose-Capillary 252 (H) 70 - 99 mg/dL  Glucose, capillary     Status: Abnormal   Collection Time: 03/04/19 12:46 AM  Result Value Ref Range   Glucose-Capillary 282 (H) 70 - 99 mg/dL  Glucose, capillary     Status: Abnormal   Collection Time: 03/04/19  4:34 AM  Result Value Ref Range   Glucose-Capillary 235 (H) 70 - 99 mg/dL  Glucose, capillary     Status: Abnormal   Collection Time: 03/04/19  7:41 AM  Result Value Ref Range   Glucose-Capillary 235 (H) 70 - 99 mg/dL    Medications:  Current Facility-Administered Medications  Medication Dose Route Frequency Provider Last Rate Last Dose  . amLODipine (NORVASC) tablet 5 mg  5 mg Oral Daily Oretha Milch D, MD   5 mg at 03/04/19 0906  . chlorhexidine (PERIDEX) 0.12 % solution 15 mL  15 mL Mouth Rinse BID [place] Eric J, DO   15 mL at 03/04/19 0906  . cloNIDine (CATAPRES - Dosed in mg/24 hr) patch 0.2 mg  0.2 mg Transdermal Q Sat Finnigan, Nancy A, DO   0.2 mg at 03/01/19 1003  . escitalopram (LEXAPRO) tablet 5 mg  5 mg Oral Daily Oretha Milch D, MD   5 mg at 03/04/19 0906  . feeding supplement (GLUCERNA SHAKE) (GLUCERNA SHAKE) liquid 237 mL  237 mL Oral TID BM Oretha Milch D, MD   237 mL at 03/04/19 0908  . folic acid (FOLVITE) tablet 1 mg  1 mg Oral Daily Zada Finders R, MD   1 mg at 03/04/19 0906  . hydrALAZINE (APRESOLINE) injection 10 mg  10 mg Intravenous Q6H PRN Thurnell Lose, MD   10 mg at 02/22/19 1700  . insulin aspart (novoLOG) injection 0-9 Units  0-9 Units Subcutaneous TID  WC [place] Eric J, DO   3 Units at 03/04/19 0754  . insulin aspart (novoLOG) injection 3 Units  3 Units Subcutaneous TID WC Oretha Milch D, MD   3 Units at 03/04/19 0849  . lactulose (CHRONULAC) 10 GM/15ML solution 20 g  20 g Oral BID Thurnell Lose, MD   20 g at 03/04/19 0905  . MEDLINE mouth rinse  15 mL Mouth Rinse q12n4p [place] Eric J, DO   15 mL at 03/03/19 1627  . multivitamin with minerals tablet 1 tablet  1 tablet Oral Daily Lenore Cordia, MD   1 tablet at 03/04/19 0906  . nicotine (NICODERM CQ - dosed in mg/24 hours) patch 14 mg  14 mg Transdermal Daily Lenore Cordia, MD   14 mg at 03/04/19 0912  . promethazine (PHENERGAN) injection 6.25 mg  6.25 mg Intravenous Q6H PRN Zada Finders R, MD   6.25 mg at 02/19/19 0323  . thiamine (VITAMIN B-1) tablet 100 mg  100 mg Oral Daily Lenore Cordia, MD   100 mg at 03/04/19 1610    Musculoskeletal: Strength & Muscle Tone: No atrophy noted. Gait & Station: UTA since patient is lying in bed. Patient leans: N/A  Psychiatric Specialty Exam: Physical Exam  Nursing note and vitals reviewed. Constitutional: He is oriented to person, place, and time. He appears well-developed and well-nourished.  HENT:  Head: Normocephalic and atraumatic.  Neck: Normal range of motion.  Respiratory: Effort normal.  Musculoskeletal: Normal range of motion.  Neurological: He is alert and oriented to person, place, and time.  Psychiatric: He has a normal mood and affect. His speech is normal and behavior is  normal. Judgment and thought content normal. Cognition and memory are normal.    Review of Systems  Cardiovascular: Negative for chest pain.  Gastrointestinal: Negative for abdominal pain, constipation, diarrhea, nausea and vomiting.  Psychiatric/Behavioral: Positive for substance abuse. Negative for depression, hallucinations and suicidal ideas. The patient does not have insomnia.   All other systems reviewed and are negative.   Blood pressure  123/71, pulse (!) 58, temperature 98.9 F (37.2 C), resp. rate 18, height 5\' 8"  (1.727 m), weight 49.3 kg, SpO2 100 %.Body mass index is 16.53 kg/m.  General Appearance: Fairly Groomed, middle aged, African American male, wearing a hospital gown with poor dentition who is lying in bed. NAD.   Eye Contact:  Good  Speech:  Clear and Coherent and Normal Rate  Volume:  Normal  Mood:  "Okay"  Affect:  Appropriate and Congruent  Thought Process:  Linear and Descriptions of Associations: Intact  Orientation:  Full (Time, Place, and Person)  Thought Content:  Logical  Suicidal Thoughts:  No  Homicidal Thoughts:  No  Memory:  Immediate;   Good Recent;   Good Remote;   Good  Judgement:  Fair  Insight:  Fair  Psychomotor Activity:  Normal  Concentration:  Concentration: Good and Attention Span: Good  Recall:  Good  Fund of Knowledge:  Good  Language:  Good  Akathisia:  No  Handed:  Right  AIMS (if indicated):   N/A  Assets:  Communication Skills Desire for Improvement Housing Resilience Social Support  ADL's:  Impaired  Cognition:  WNL  Sleep:   Okay   Assessment:  Rodney Barber is a 53 y.o. male who was admitted with acute hepatic and metabolic encephalopathy. He was initially obtunded but improved and was treated with IV Acetylcysteine. Patient admits to suicide attempt by Tylenol overdose due to depression. Today he is able to identify recent stressors which include the recent passing of his mother and brother days apart. He reports an improvement in his mood for the past few days. He denies SI, HI or AVH. He is able to safety plan. He no longer warrants inpatient psychiatric hospitalization. Recommend SW provide patient with resources for outpatient psychiatry for medication management and grief therapy.    Treatment Plan Summary: -Continue Lexapro 5 mg daily for depression. Can increase in future if needed for worsening mood.  -Please have SW provide patient with resources for  outpatient psychiatry for medication management and grief therapy.    Disposition: No evidence of imminent risk to self or others at present.   Patient does not meet criteria for psychiatric inpatient admission.  This service was provided via telemedicine using a 2-way, interactive audio and video technology.  Names of all persons participating in this telemedicine service and their role in this encounter. Name: Juanetta BeetsJacqueline Zahra Peffley, DO Role: Psychiatrist   Name: Rodney Barber  Role: Patient     Cherly BeachJacqueline J Aneesha Holloran, DO 03/04/2019 12:14 PM

## 2019-03-04 NOTE — Progress Notes (Signed)
Nutrition Follow-up  RD working remotely.  DOCUMENTATION CODES:   Severe malnutrition in context of chronic illness, Underweight  INTERVENTION:   -Continue Glucerna Shake po TID, each supplement provides 220 kcal and 10 grams of protein -Continue Magic cup TID with meals, each supplement provides 290 kcal and 9 grams of protein -Continue MVI with minerals daily  NUTRITION DIAGNOSIS:   Severe Malnutrition related to chronic illness(chronic pancreatitis) as evidenced by percent weight loss, moderate fat depletion, severe fat depletion, moderate muscle depletion, severe muscle depletion.  Ongoing  GOAL:   Patient will meet greater than or equal to 90% of their needs  Progressing   MONITOR:   PO intake, Supplement acceptance, Labs, Weight trends, Skin, I & O's  REASON FOR ASSESSMENT:   Malnutrition Screening Tool    ASSESSMENT:   53 year old male with medical history significant for IDDM, HTN,  EtoH abuse, chronic pancreatitis w/ hx of chronic biliary stenosis s/p ERCP w/ stent placement presented to ED with persistent n/v over the past 2 days  7/22- psych recommending inpatient psychiatric hospitalization  7/28- pt no longer meets criteria for inpatient psychiatric hospitalization  Reviewed I/O's: +837 ml x 24 hours and +15.8 L since admission  Pt remains with good appetite; meal completion 50-100%. Pt is consuming Glucerna supplements.   Noted some hypoglycemic episodes, however, CBGS >200 today.   Per MD notes, pt will likely be medically stable for SNF if no further episodes of hypoglycemia within the next 24 hours.   No new wt since last visit to assess.   Medications reviewed and include folic acid, lactulose, and thiamine.   Labs reviewed: CBGS: 235-282 (inpatient orders for glycemic control are 0-9 units insulin aspart TID with meals, 3 units insulin aspart TID with meals, and 5 units insulin glargine daily).   Diet Order:   Diet Order            Diet  Carb Modified Fluid consistency: Thin; Room service appropriate? Yes  Diet effective now              EDUCATION NEEDS:   Education needs have been addressed  Skin:  Skin Assessment: Reviewed RN Assessment  Last BM:  03/03/19  Height:   Ht Readings from Last 1 Encounters:  02/19/19 5\' 8"  (1.727 m)    Weight:   Wt Readings from Last 1 Encounters:  02/22/19 49.3 kg    Ideal Body Weight:  70 kg  BMI:  Body mass index is 16.53 kg/m.  Estimated Nutritional Needs:   Kcal:  1700-1900  Protein:  85-100 grams  Fluid:  > 1.7 L    Eudell Mcphee A. Jimmye Norman, RD, LDN, [place] Registered Dietitian II Certified Diabetes Care and Education Specialist Pager: [phone] After hours Pager: [phone]

## 2019-03-04 NOTE — Progress Notes (Signed)
PT Cancellation Note  Patient Details Name: Rodney Barber MRN: [id number] DOB: 27-Jun-1966   Cancelled Treatment:    Reason Eval/Treat Not Completed: Other (comment).  Pt refused stating it is too early for him.  Follow up as time and pt allow.   Ramond Dial 03/04/2019, 11:49 AM    Mee Hives, PT MS Acute Rehab Dept. Number: [employer] [facility] [place]

## 2019-03-04 NOTE — Progress Notes (Signed)
[facility]  PROGRESS NOTE  Rodney Barber ONG:[id number] DOB: 06-20-1966 DOA: 02/18/2019 PCP: Gildardo Pounds, NP  Brief History    Rodney Barber is a 53 y.o. year old male with medical history significant for uncontrolled insulin-dependent type 2 diabetes, hypertension, chronic pancreatitis with history of chronic biliary stenosis, tobacco use, alcohol use, and substance use disorder  who presented on 02/18/2019 with persistent nausea and vomiting and was found to have DKA with elevated LFTs and concern for acetaminophen toxicity no fulminant acute liver failure and acute hepatitis.  A & P  Acute liver failure/hepatitis concerning for acetaminophen toxicity, continues to improve.  LFTs, alkaline phosphatase and INR continue to improve.  Patient completed acetylcysteine course (last dose 7/18)j which seemed to have helped him turn the corner.  Given resolution of encephalopathy related to hepatic derangements and improvement in liver function labs patient has continued supportive care here at [facility] ( previously a candidate for transfer to [facility] when having liver faliure)  Intentional overdose, no further SI, much improved. Admitted to intention to harm with overdose of tylenol prior to admission.    Psychiatric evaluation on 7/23: given patient with no internal stimulus recommended inpatient psychiatric admission; however, upon reevaluation on 7/28 patient is noted to be much improved with no recurrent suicidal ideation throughout hospitalization.  Suicide attempt occurred in setting of extreme stress (to loss of his mother and brother in a short period time) . Psych deems stable and ok for SNF with outpatient resources for group therapy, and psychiatry and therapist outpatient resources, will consult social worker  Acute liver failure induced coagulopathy.  INR trending down, peak of 4.4, currently 1.1.  Continue to trend  Acute hepatic/metabolic encephalopathy, resolved  Elevated  ammonia patient with acute liver failure was treated with lactulose.  Alert today and oriented to time and place with no signs of asterixis on exam.  We will continue current lactulose regimen and monitor closely.  Hypokalemia, stable.  Potassium stable at 3.9, replete, mg wnl, closely monitor.  AKI, stable  Presented with AKI with elevated creatinine like related to ATN in setting of liver failure bilirubin toxicity in addition to volume depletion, improved with IV fluids while patient was n.p.o.  Other etiologies of potential CKD include substance use (cocaine/alcohol use) now improved.  Creatinine remained stable at 1.3-1.4, suspect some level of chronic disease kidney disease.  Avoid nephrotoxins, monitor urine output.  No indication for HD.  Normocytic anemia.  Hemoglobin back to baseline 8-9, no signs or symptoms of bleeding, monitor CBC.  Type 2 diabetes, Poorly controlled, G4W 10.2% complicated by hypoglycemic episodes in hospital. Given his recovering liver function I suspect his hypoglycemia is driven by depleted glycogen storage (despite adequate oral intake).  Last hypoglycemic episode on 7/26 overnight( 29), seems to have resolved with discontinuation of Lantus 10 U.  Will resume scheduled short-acting insulin with meals given postprandial hyperglycemia now.  Will start low-dose Lantus 5 units given fasting blood glucose this am over 230 and closely monitor for recurrent hypoglycemia if remains stable anticipate being able to discharge to skilled nursing facility once bed available .   Malnutrition.  Nutrition consulted, continue Ensure supplementation.  Chronic alcohol use.  No signs of withdrawal currently.  Continue clonidine, folic acid and thiamine, as well as multivitamin.    Chronic pancreatitis.  Lipase within normal limits, denies any abdominal pain.  Has outpatient GI follow-up arranged.     DVT prophylaxis: SCDs Code Status: DNR Family Communication: No family at  bedside  Disposition Plan: if remains without hypoglycemic episodes over next 24 hours should be safe medically for SNF,   [facility] Direct contact: see [email or web address] (further directions at bottom of note if needed) 7PM-7AM contact night coverage as at bottom of note 03/04/2019, 1:35 PM  LOS: 14 days   Consultants  . Nephrology, gastroenterology, psychiatry  Procedures  Renal US -    1. 19 mm mid to lower left renal calculus causing mild to moderate dilatation of the upper pole collecting system. 2. Additional smaller, nonobstructing calculi in the lower pole of the left kidney. 3. 10 mm nonobstructing right renal calculus. 4. Borderline increased echogenicity of both kidneys, suggesting the possibility of mild medical renal disease. 5. Mild amount of pericholecystic fluid without significant change and minimal ascites.  RUQ [place] - 1. Positive for cholelithiasis, borderline to mild gallbladder wall thickening, borderline to mildly dilated CBD, and trace pericholecystic fluid. No sonographic Rodney Barber sign was elicited, and there is no intrahepatic biliary dilatation to strongly suggest acute duct obstruction. However, consider the possibility of choledocholithiasis. MRCP versus Nuclear Medicine Hepatobiliary Scan could be valuable in this clinical setting. 2. Nephrolithiasis. No other acute ultrasound finding in the abdomen.  CT head - Non acute   Antibiotics  . none  Interval History/Subjective  No hypoglycemia overnight.  No abdominal pain, no nausea, no vomiting.  Tolerating diet. Objective   Vitals:  Vitals:   03/04/19 0854 03/04/19 1201  BP: 132/76 123/71  Pulse: (!) 55 (!) 58  Resp: 17 18  Temp:  98.9 F (37.2 C)  SpO2: 100% 100%    Exam:  Cachectic, male who looks older than stated age, awake Alert, slow in speech, oriented X 3, No new F.N deficits, Normal affect Anicteric sclera Symmetrical Chest wall movement, Good air movement bilaterally, CTAB +ve B.Sounds, Abd  Soft, No tenderness, No organomegaly appriciated, No rebound - guarding or rigidity. No asterixis, no appreciable focal deficits   I have personally reviewed the following:   Data Reviewed: Basic Metabolic Panel: Recent Labs  Lab 02/26/19 0334 02/27/19 0227 02/28/19 0350 03/01/19 0338  NA 139 138 136 135  K 3.5 3.2* 3.3* 3.9  CL 111 108 107 106  CO2 22 22 23  21*  GLUCOSE 190* 81 132* 244*  BUN 26* 26* 22* 15  CREATININE 1.46* 1.39* 1.44* 1.32*  CALCIUM 7.8* 8.1* 7.9* 8.0*  MG 1.7 1.8 1.7 1.7   Liver Function Tests: Recent Labs  Lab 02/26/19 0334 02/27/19 0227 02/28/19 0350 03/01/19 0338  AST 73* 61* 48* 45*  ALT 273* 207* 167* 139*  ALKPHOS 504* 425* 382* 367*  BILITOT 1.7* 1.4* 1.4* 1.1  PROT 4.8* 4.5* 4.7* 5.2*  ALBUMIN 1.8* 1.8* 1.8* 1.9*   No results for input(s): LIPASE, AMYLASE in the last 168 hours. Recent Labs  Lab 02/26/19 0334 02/27/19 0227  AMMONIA 40* 39*   CBC: Recent Labs  Lab 02/26/19 0334 02/27/19 0227 02/28/19 0350 03/01/19 0338  WBC 10.1 8.5 6.8 5.4  HGB 8.6* 7.9* 8.2* 8.4*  HCT 26.0* 24.4* 25.7* 25.9*  MCV 88.7 91.0 92.4 91.8  PLT 96* 117* 149* 155   Cardiac Enzymes: No results for input(s): CKTOTAL, CKMB, CKMBINDEX, TROPONINI in the last 168 hours. BNP (last 3 results) No results for input(s): BNP in the last 8760 hours.  ProBNP (last 3 results) No results for input(s): PROBNP in the last 8760 hours.  CBG: Recent Labs  Lab 03/03/19 1956 03/04/19 0046 03/04/19 0434 03/04/19 0741 03/04/19 1228  GLUCAP 252* 282* 235* 235* 316*    No results found for this or any previous visit (from the past 240 hour(s)).   Studies: No results found.  Scheduled Meds: . amLODipine  5 mg Oral Daily  . chlorhexidine  15 mL Mouth Rinse BID  . cloNIDine  0.2 mg Transdermal Q Sat  . escitalopram  5 mg Oral Daily  . feeding supplement (GLUCERNA SHAKE)  237 mL Oral TID BM  . folic acid  1 mg Oral Daily  . insulin aspart  0-9 Units  Subcutaneous TID WC  . insulin aspart  3 Units Subcutaneous TID WC  . lactulose  20 g Oral BID  . mouth rinse  15 mL Mouth Rinse q12n4p  . multivitamin with minerals  1 tablet Oral Daily  . nicotine  14 mg Transdermal Daily  . thiamine  100 mg Oral Daily   Continuous Infusions:  Principal Problem:   Suicide attempt [facility](HCC) Active Problems:   Chronic pancreatitis (HCC)   Hypokalemia   AKI (acute kidney injury) (HCC)   Shock liver   Hyperosmolar non-ketotic state in patient with type 2 diabetes mellitus (HCC)   Alcohol use   Substance use disorder   Goals of care, counseling/discussion   Palliative care by specialist   Acetaminophen toxicity      Laverna PeaceShayla D   [facility]

## 2019-03-05 LAB — GLUCOSE, CAPILLARY
Glucose-Capillary: 234 mg/dL — ABNORMAL HIGH (ref 70–99)
Glucose-Capillary: 246 mg/dL — ABNORMAL HIGH (ref 70–99)
Glucose-Capillary: 244 mg/dL — ABNORMAL HIGH (ref 70–99)
Glucose-Capillary: 260 mg/dL — ABNORMAL HIGH (ref 70–99)
Glucose-Capillary: 73 mg/dL (ref 70–99)
Glucose-Capillary: 315 mg/dL — ABNORMAL HIGH (ref 70–99)

## 2019-03-05 MED ORDER — INSULIN GLARGINE 100 UNIT/ML ~~LOC~~ SOLN
7.0000 [IU] | Freq: Every day | SUBCUTANEOUS | Status: DC
  Administered 2019-03-05 – 2019-03-06 (×2): 7 [IU] via SUBCUTANEOUS
  Filled 2019-03-05 (×3): qty 0.07

## 2019-03-05 NOTE — Progress Notes (Signed)
Patient ID: Rodney Barber, male   DOB: 1965-10-22, 53 y.o.   MRN: [id number]  PROGRESS NOTE    Alhaji Mcneal  ENI:[id number] DOB: 25-Nov-1965 DOA: 02/18/2019 PCP: Gildardo Pounds, NP   Brief Narrative:  53 year old male with history of uncontrolled insulin-dependent type 2 diabetes, hypertension, chronic pancreatitis with history of chronic biliary stenosis, tobacco use, alcohol use and substance use disorder presented on 02/18/2019 with persistent nausea and vomiting and was found to have DKA along with elevated LFTs and concern for acetaminophen toxicity.  He was treated with N-acetylcysteine.  During the hospitalization, his LFTs have improved.  Psychiatry initially recommended inpatient psychiatric hospitalization but subsequent reevaluation on 03/04/2019, psychiatry deemed that patient would be appropriate for SNF.  Assessment & Plan:   Elevated LFTs/acute hepatitis with concern for acetaminophen toxicity -Patient was initially treated with N-acetylcysteine and completed course on 02/22/2019. -LFTs have much improved.  Encephalopathy has resolved.  Patient was initially planned for transfer to [facility] for liver failure but since his liver function stabilized patient has remained in [facility] monitoring LFTs.  Intentional overdose with suicidal intention: - Psychiatry initially recommended inpatient psychiatric hospitalization but subsequent reevaluation on 03/04/2019, psychiatry deemed that patient would be appropriate for SNF. -Outpatient follow-up with psychiatry  Coagulopathy -Most likely secondary to acute liver injury.  Peaked up to 4.4.  Improved.  Acute metabolic encephalopathy  -Resolved.  Continue lactulose.  Monitor mental status.  Fall precaution.  Acute kidney injury -Resolved.  Monitor intermittently.  Chronic normocytic anemia -Hemoglobin stable.  Monitor intermittently  Diabetes mellitus type 2 poorly controlled with hyperglycemia and intermittent  hypoglycemia -Blood sugars increasing.  Will increase Lantus to 7 units daily along with short-acting insulin with meals and CBGs with SSI.  Malnutrition -Continue nutrition recommendations  Chronic alcohol use - No signs of withdrawal currently.  Continue clonidine, folic acid and thiamine, as well as multivitamin.    Chronic pancreatitis - Lipase within normal limits, denies any abdominal pain.  Has outpatient GI follow-up arranged.   DVT prophylaxis: SCDs Code Status: DNR Family Communication: None at bedside Disposition Plan: SNF once bed is available  Consultants: Nephrology/gastroenterology/psychiatry/palliative care Procedures: None  Antimicrobials: None   Subjective: Patient seen and examined at bedside.  He is a poor historian.  Denies any overnight fever or vomiting or worsening shortness of breath.  Objective: Vitals:   03/05/19 0420 03/05/19 0731 03/05/19 0857 03/05/19 1141  BP: (!) 145/74 (!) 161/77 (!) 143/75 131/79  Pulse: (!) 54 (!) 54 61 (!) 57  Resp: 18 16 18 18   Temp: 98.5 F (36.9 C) 98.1 F (36.7 C)  98.3 F (36.8 C)  TempSrc: Oral Oral  Oral  SpO2: 100% 100% 100% 100%  Weight:      Height:        Intake/Output Summary (Last 24 hours) at 03/05/2019 1534 Last data filed at 03/05/2019 1248 Gross per 24 hour  Intake 840 ml  Output -  Net 840 ml   Filed Weights   02/18/19 1945 02/19/19 0230 02/22/19 0519  Weight: 61.2 kg 46.3 kg 49.3 kg    Examination:  General exam: Appears calm and comfortable.  No distress.  Poor historian.  Slow to respond to questions. Respiratory system: Bilateral decreased breath sounds at bases Cardiovascular system: S1 & S2 heard, Rate controlled Gastrointestinal system: Abdomen is nondistended, soft and nontender. Normal bowel sounds heard. Extremities: No cyanosis, clubbing, edema    Data Reviewed: I have personally reviewed following labs and imaging studies  CBC: Recent Labs  Lab 02/27/19 0227 02/28/19  0350 03/01/19 0338  WBC 8.5 6.8 5.4  HGB 7.9* 8.2* 8.4*  HCT 24.4* 25.7* 25.9*  MCV 91.0 92.4 91.8  PLT 117* 149* 155   Basic Metabolic Panel: Recent Labs  Lab 02/27/19 0227 02/28/19 0350 03/01/19 0338  NA 138 136 135  K 3.2* 3.3* 3.9  CL 108 107 106  CO2 22 23 21*  GLUCOSE 81 132* 244*  BUN 26* 22* 15  CREATININE 1.39* 1.44* 1.32*  CALCIUM 8.1* 7.9* 8.0*  MG 1.8 1.7 1.7   GFR: Estimated Creatinine Clearance: 44.6 mL/min (A) (by C-G formula based on SCr of 1.32 mg/dL (H)). Liver Function Tests: Recent Labs  Lab 02/27/19 0227 02/28/19 0350 03/01/19 0338  AST 61* 48* 45*  ALT 207* 167* 139*  ALKPHOS 425* 382* 367*  BILITOT 1.4* 1.4* 1.1  PROT 4.5* 4.7* 5.2*  ALBUMIN 1.8* 1.8* 1.9*   No results for input(s): LIPASE, AMYLASE in the last 168 hours. Recent Labs  Lab 02/27/19 0227  AMMONIA 39*   Coagulation Profile: Recent Labs  Lab 02/27/19 0227 02/28/19 0350 03/01/19 0338  INR 1.0 1.1 1.1   Cardiac Enzymes: No results for input(s): CKTOTAL, CKMB, CKMBINDEX, TROPONINI in the last 168 hours. BNP (last 3 results) No results for input(s): PROBNP in the last 8760 hours. HbA1C: No results for input(s): HGBA1C in the last 72 hours. CBG: Recent Labs  Lab 03/04/19 2006 03/05/19 0034 03/05/19 0422 03/05/19 0729 03/05/19 1138  GLUCAP 182* 246* 234* 244* 260*   Lipid Profile: No results for input(s): CHOL, HDL, LDLCALC, TRIG, CHOLHDL, LDLDIRECT in the last 72 hours. Thyroid Function Tests: No results for input(s): TSH, T4TOTAL, FREET4, T3FREE, THYROIDAB in the last 72 hours. Anemia Panel: No results for input(s): VITAMINB12, FOLATE, FERRITIN, TIBC, IRON, RETICCTPCT in the last 72 hours. Sepsis Labs: No results for input(s): PROCALCITON, LATICACIDVEN in the last 168 hours.  No results found for this or any previous visit (from the past 240 hour(s)).       Radiology Studies: No results found.      Scheduled Meds: . amLODipine  5 mg Oral Daily   . chlorhexidine  15 mL Mouth Rinse BID  . cloNIDine  0.2 mg Transdermal Q Sat  . escitalopram  5 mg Oral Daily  . feeding supplement (GLUCERNA SHAKE)  237 mL Oral TID BM  . folic acid  1 mg Oral Daily  . insulin aspart  0-9 Units Subcutaneous TID WC  . insulin aspart  3 Units Subcutaneous TID WC  . insulin glargine  7 Units Subcutaneous Daily  . lactulose  20 g Oral BID  . mouth rinse  15 mL Mouth Rinse q12n4p  . multivitamin with minerals  1 tablet Oral Daily  . nicotine  14 mg Transdermal Daily  . thiamine  100 mg Oral Daily   Continuous Infusions:   LOS: 15 days        Glade Lloyd, MD [facility] 03/05/2019, 3:34 PM

## 2019-03-05 NOTE — Progress Notes (Signed)
Palliative Medicine RN Note: Our team has been following Rodney Barber's progress through his hospitalization, and his case has been discussed in team rounds several times. At this time, GOC are clear, and there is no further role for our involvement. If this changes or new, specific needs arise. Please re-consult or call our office.  Thank you for this referral.  Marjie Skiff. Charnel Giles, RN, BSN, [facility] Palliative Medicine Team 03/05/2019 1:06 PM Office [phone]

## 2019-03-05 NOTE — Progress Notes (Signed)
Occupational Therapy Treatment Patient Details Name: Rodney Barber MRN: [id number] DOB: 18-Aug-1965 Today's Date: 03/05/2019    History of present illness 53 y.o. male with medical history significant for uncontrolled insulin-dependent type 2 diabetes, hypertension, chronic pancreatitis with history of chronic biliary stenosis, tobacco use, alcohol use, and substance use disorder who presents to the ED for evaluation of persistent nausea and vomiting.   OT comments  Pt progressing towards goals.  Pt reports having bathed with staff in shower at overall supervision level.  Pt reports no lightheadedness/dizziness with mobility or self-care tasks.  Completed LB dressing at sit > stand level from EOB with supervision.  Pt initially with instability upon standing but able to correct without assistance.  Ambulated 100' without AD with min guard.  Pt able to stoop to pick item up from floor without assistance, close supervision.  Pt with limited awareness of impairments.  Will continue to benefit from OT acutely to prepare to d/c to next venue of care.  Follow Up Recommendations  SNF;Supervision/Assistance - 24 hour(vs [facility])    Equipment Recommendations  3 in 1 bedside commode       Precautions / Restrictions Precautions Precautions: Fall Precaution Comments: sitter present. Suicide precautions       Mobility Bed Mobility   Bed Mobility: Supine to Sit;Sit to Supine     Supine to sit: Modified independent (Device/Increase time) Sit to supine: Modified independent (Device/Increase time)      Transfers Overall transfer level: Needs assistance Equipment used: 1 person hand held assist Transfers: Sit to/from Stand Sit to Stand: Min guard         General transfer comment: Min guard as pt initially unsteady upon standing, but able to correct without additional assist    Balance Overall balance assessment: Needs assistance Sitting-balance support: Feet supported Sitting  balance-Leahy Scale: Good Sitting balance - Comments: pt donned socks sitting EOB with supervision, no LOB                                   ADL either performed or assessed with clinical judgement   ADL Overall ADL's : Needs assistance/impaired     Grooming: Supervision/safety;Standing   Upper Body Bathing: Set up;Supervision/ safety;Sitting   Lower Body Bathing: Sit to/from stand;Set up;Supervison/ safety   Upper Body Dressing : Set up;Sitting   Lower Body Dressing: Set up;Supervision/safety;Sit to/from stand   Toilet Transfer: Min guard;Ambulation           Functional mobility during ADLs: Min guard General ADL Comments: Ambulated without AD this session with min guard to close supervision.  Pt reports that he showered with staff yesterday and was able to complete all bathing/dressing at overall suprevision level. Pt able to retrieve item from floor in standing with close supervision               Cognition Arousal/Alertness: Awake/alert Behavior During Therapy: Flat affect Overall Cognitive Status: Impaired/Different from baseline Area of Impairment: Awareness;Problem solving;Safety/judgement                       Following Commands: Follows one step commands consistently Safety/Judgement: Decreased awareness of safety Awareness: Emergent                       Pertinent Vitals/ Pain       Pain Assessment: No/denies pain     Prior Functioning/Environment  Frequency  Min 2X/week        Progress Toward Goals  OT Goals(current goals can now be found in the care plan section)  Progress towards OT goals: Progressing toward goals  Acute Rehab OT Goals Patient Stated Goal: to be walking OT Goal Formulation: With patient Time For Goal Achievement: 03/11/19 Potential to Achieve Goals: Good  Plan Discharge plan remains appropriate       AM-PAC OT "6 Clicks" Daily Activity     Outcome Measure   Help from  another person eating meals?: A Little Help from another person taking care of personal grooming?: A Little Help from another person toileting, which includes using toliet, bedpan, or urinal?: A Little Help from another person bathing (including washing, rinsing, drying)?: A Little Help from another person to put on and taking off regular upper body clothing?: A Little Help from another person to put on and taking off regular lower body clothing?: A Little 6 Click Score: 18    End of Session Equipment Utilized During Treatment: Gait belt  OT Visit Diagnosis: Unsteadiness on feet (R26.81);Other abnormalities of gait and mobility (R26.89);Muscle weakness (generalized) (M62.81);Other symptoms and signs involving cognitive function   Activity Tolerance Patient tolerated treatment well   Patient Left in bed;with nursing/sitter in room;with call bell/phone within reach   Nurse Communication Mobility status        Time: 4128-7867 OT Time Calculation (min): 12 min  Charges: OT General Charges $OT Visit: 1 Visit OT Treatments $Self Care/Home Management : 8-22 mins   Simonne Come, [place] 03/05/2019, 11:13 AM

## 2019-03-06 LAB — COMPREHENSIVE METABOLIC PANEL
ALT: 54 U/L — ABNORMAL HIGH (ref 0–44)
AST: 28 U/L (ref 15–41)
Albumin: 2 g/dL — ABNORMAL LOW (ref 3.5–5.0)
Alkaline Phosphatase: 205 U/L — ABNORMAL HIGH (ref 38–126)
Anion gap: 5 (ref 5–15)
BUN: 14 mg/dL (ref 6–20)
CO2: 26 mmol/L (ref 22–32)
Calcium: 7.7 mg/dL — ABNORMAL LOW (ref 8.9–10.3)
Chloride: 103 mmol/L (ref 98–111)
Creatinine, Ser: 1.18 mg/dL (ref 0.61–1.24)
GFR calc Af Amer: >60 mL/min (ref >60)
GFR calc non Af Amer: >60 mL/min (ref >60)
Glucose, Bld: 202 mg/dL — ABNORMAL HIGH (ref 70–99)
Potassium: 3.8 mmol/L (ref 3.5–5.1)
Sodium: 134 mmol/L — ABNORMAL LOW (ref 135–145)
Total Bilirubin: 1.1 mg/dL (ref 0.3–1.2)
Total Protein: 4.8 g/dL — ABNORMAL LOW (ref 6.5–8.1)

## 2019-03-06 LAB — CBC WITH DIFFERENTIAL/PLATELET
Abs Immature Granulocytes: 0.02 10*3/uL (ref 0.00–0.07)
Basophils Absolute: 0.1 10*3/uL (ref 0.0–0.1)
Basophils Relative: 1 %
Eosinophils Absolute: 0.1 10*3/uL (ref 0.0–0.5)
Eosinophils Relative: 1 %
HCT: 24.6 % — ABNORMAL LOW (ref 39.0–52.0)
Hemoglobin: 8.1 g/dL — ABNORMAL LOW (ref 13.0–17.0)
Immature Granulocytes: 0 %
Lymphocytes Relative: 25 %
Lymphs Abs: 1.5 10*3/uL (ref 0.7–4.0)
MCH: 30.5 pg (ref 26.0–34.0)
MCHC: 32.9 g/dL (ref 30.0–36.0)
MCV: 92.5 fL (ref 80.0–100.0)
Monocytes Absolute: 0.4 10*3/uL (ref 0.1–1.0)
Monocytes Relative: 7 %
Neutro Abs: 4.1 10*3/uL (ref 1.7–7.7)
Neutrophils Relative %: 66 %
Platelets: 148 10*3/uL — ABNORMAL LOW (ref 150–400)
RBC: 2.66 MIL/uL — ABNORMAL LOW (ref 4.22–5.81)
RDW: 14 % (ref 11.5–15.5)
WBC: 6.2 10*3/uL (ref 4.0–10.5)
nRBC: 0 % (ref 0.0–0.2)

## 2019-03-06 LAB — PROTIME-INR
INR: 1 (ref 0.8–1.2)
Prothrombin Time: 13.3 seconds (ref 11.4–15.2)

## 2019-03-06 LAB — GLUCOSE, CAPILLARY
Glucose-Capillary: 230 mg/dL — ABNORMAL HIGH (ref 70–99)
Glucose-Capillary: 191 mg/dL — ABNORMAL HIGH (ref 70–99)
Glucose-Capillary: 209 mg/dL — ABNORMAL HIGH (ref 70–99)
Glucose-Capillary: 260 mg/dL — ABNORMAL HIGH (ref 70–99)
Glucose-Capillary: 196 mg/dL — ABNORMAL HIGH (ref 70–99)

## 2019-03-06 LAB — MAGNESIUM: Magnesium: 1.6 mg/dL — ABNORMAL LOW (ref 1.7–2.4)

## 2019-03-06 MED ORDER — AMLODIPINE BESYLATE 5 MG PO TABS: 5.0000 mg | ORAL_TABLET | Freq: Every day | ORAL | 0 refills | Status: DC

## 2019-03-06 MED ORDER — LACTULOSE 10 GM/15ML PO SOLN: 20.0000 g | Freq: Two times a day (BID) | ORAL | 0 refills | Status: DC

## 2019-03-06 MED ORDER — FOLIC ACID 1 MG PO TABS: 1.0000 mg | ORAL_TABLET | Freq: Every day | ORAL | 0 refills | Status: DC

## 2019-03-06 MED ORDER — THIAMINE HCL 100 MG PO TABS: 100.0000 mg | ORAL_TABLET | Freq: Every day | ORAL | 0 refills | Status: DC

## 2019-03-06 MED ORDER — MAGNESIUM SULFATE 2 GM/50ML IV SOLN
2.0000 g | Freq: Once | INTRAVENOUS | Status: AC
  Administered 2019-03-06: 2 g via INTRAVENOUS
  Filled 2019-03-06: qty 50

## 2019-03-06 MED ORDER — ADULT MULTIVITAMIN W/MINERALS CH: 1.0000 | ORAL_TABLET | Freq: Every day | ORAL | Status: DC

## 2019-03-06 MED ORDER — INSULIN DETEMIR 100 UNIT/ML ~~LOC~~ SOLN: 7.0000 [IU] | Freq: Every day | SUBCUTANEOUS | Status: DC

## 2019-03-06 MED FILL — AMLODIPINE BESYLATE 5 MG TA: 5 | 30 days supply | Qty: 30 | Fill #0

## 2019-03-06 MED FILL — LACTULOSE 10 GM/15 ML SOLN: 10 | 4 days supply | Qty: 236 | Fill #0

## 2019-03-06 MED FILL — VITAMIN B-1 100 MG TABS: 100 | 30 days supply | Qty: 30 | Fill #0

## 2019-03-06 MED FILL — LACTULOSE 10 GM/15 ML SOLN: 10 | 8 days supply | Qty: 236 | Fill #0

## 2019-03-06 MED FILL — ?FOLIC ACID 1MG TAB: 1 | 30 days supply | Qty: 30 | Fill #0

## 2019-03-06 MED FILL — ?AMLODIPINE BESYLATE 5MG TA: 5 | 30 days supply | Qty: 30 | Fill #0

## 2019-03-06 MED FILL — FOLIC ACID 1 MG TABS: 1 | 30 days supply | Qty: 30 | Fill #0

## 2019-03-06 NOTE — TOC Transition Note (Addendum)
Transition of Care [facility]) - CM/SW Discharge Note   Patient Details  Name: Rodney Barber MRN: [id number] Date of Birth: [birth year]  [facility] CM/SW Contact:  Benard Halsted, LCSW Phone Number: 03/06/2019, 4:21 PM   Clinical Narrative:    Patient ready for discharge. Medications delivered to his room. Patient's brother returned CSW's call and is in agreement with plan for patient to get a taxi home.   Final next level of care: [place] Barriers to Discharge: No Barriers Identified   Patient Goals and CMS Choice Patient states their goals for this hospitalization and ongoing recovery are:: Return home CMS Medicare.gov Compare Post Acute Care list provided to:: Patient Choice offered to / list presented to : Patient  Discharge Placement                Patient to be transferred to facility by: Taxi Name of family member notified: Left vm for brother Patient and family notified of of transfer: 03/06/19  Discharge Plan and Services In-house Referral: Clinical Social Work, Hospice / Palliative Care Discharge Planning Services: NA Post Acute Care Choice: Skilled Nursing Facility                    HH Arranged: PT, RN, OT [facility] Agency: [facility] Date [place]: 03/06/19 Time HH Agency Contacted: 1200 Representative spoke with at [place]: [place] ([place]) Interventions     Readmission Risk Interventions Readmission Risk Prevention Plan 02/26/2019 02/22/2019  Transportation Screening Complete Complete  Medication Review Press photographer) Complete Complete  PCP or Specialist appointment within 3-5 days of discharge Complete Complete  HRI or Home Care Consult Complete Complete  SW Recovery Care/Counseling Consult Complete Complete  Palliative Care Screening Complete Complete  [place] - Not Applicable  Some recent data might be hidden

## 2019-03-06 NOTE — Discharge Summary (Signed)
Physician Discharge Summary  Nazir Hacker JJH:[id number] DOB: February 12, 1966 DOA: 02/18/2019  PCP: Gildardo Pounds, NP  Admit date: 02/18/2019 Discharge date: 03/06/2019  Admitted From: Home Disposition: Home  Recommendations for Outpatient Follow-up:  1. Follow up with PCP in 1 week with repeat CBC/CMP 2. Outpatient follow-up with psychiatrist at earliest convenience 3. Follow up in ED if symptoms worsen or new appear   Home Health: Home health PT/OT/RN/aide Equipment/Devices: None  Discharge Condition: Guarded CODE STATUS: DNR Diet recommendation: Heart healthy/carb modified  Brief/Interim Summary: 53 year old male with history of uncontrolled insulin-dependent type 2 diabetes, hypertension, chronic pancreatitis with history of chronic biliary stenosis, tobacco use, alcohol use and substance use disorder presented on 02/18/2019 with persistent nausea and vomiting and was found to have DKA along with elevated LFTs and concern for acetaminophen toxicity.  He was treated with N-acetylcysteine.  During the hospitalization, his LFTs have improved.  Psychiatry initially recommended inpatient psychiatric hospitalization but subsequent reevaluation on 03/04/2019, psychiatry deemed that patient would be appropriate for SNF.  Patient did not qualify for SNF placement as per social work.  He will be discharged home with home health.  Discharge Diagnoses:   Elevated LFTs/acute hepatitis with concern for acetaminophen toxicity -Patient was initially treated with N-acetylcysteine and completed course on 02/22/2019. -LFTs have much improved.  Encephalopathy has resolved.  Patient was initially planned for transfer to [facility] for liver failure but since his liver function stabilized patient has remained in [facility].    Outpatient follow-up of LFTs.  Intentional overdose with suicidal intention: - Psychiatry initially recommended inpatient psychiatric hospitalization but subsequent reevaluation  on 03/04/2019, psychiatry deemed that patient would be appropriate for SNF. -Outpatient follow-up with psychiatry  Coagulopathy -Most likely secondary to acute liver injury.  Peaked up to 4.4.  Improved.  INR 1 today.  Acute metabolic encephalopathy  -Resolved.  Continue lactulose.  Outpatient follow-up.  Acute kidney injury -Resolved.    Outpatient follow-up.  Chronic normocytic anemia -Hemoglobin stable.  Monitor intermittently as an outpatient.  Diabetes mellitus type 2 poorly controlled with hyperglycemia and intermittent hypoglycemia -Blood sugars fluctuating with hyperglycemia and intermittent hypoglycemia.   -Discharge home on Lantus 7 units daily.  Outpatient follow-up with PCP.  Carb modified diet.    Malnutrition -Continue nutrition recommendations  Chronic alcohol use -No signs of withdrawal currently. Continue  folic acid and thiamine, as well as multivitamin upon discharge.   Chronic pancreatitis -Lipase within normal limits, denies any abdominal pain.  Continue Creon.  Outpatient follow-up with GI.    Discharge Instructions  Discharge Instructions    Ambulatory referral to Psychiatry   Complete by: As directed    Depression/had suicidal intent at some point   Diet - low sodium heart healthy   Complete by: As directed    Diet Carb Modified   Complete by: As directed    Increase activity slowly   Complete by: As directed      Allergies as of 03/06/2019   No Known Allergies     Medication List    STOP taking these medications   glipiZIDE 5 MG tablet Commonly known as: GLUCOTROL   lisinopril 5 MG tablet Commonly known as: ZESTRIL     TAKE these medications   amLODipine 5 MG tablet Commonly known as: NORVASC Take 1 tablet (5 mg total) by mouth daily. Start taking on: March 07, 2019   escitalopram 10 MG tablet Commonly known as: Lexapro Take 0.5 tablets (5 mg total) by mouth daily for 30  days.   folic acid 1 MG tablet Commonly known  as: FOLVITE Take 1 tablet (1 mg total) by mouth daily. Start taking on: March 07, 2019   glucose blood test strip Commonly known as: True Metrix Blood Glucose Test Use as instructed   insulin detemir 100 UNIT/ML injection Commonly known as: Levemir Inject 0.07 mLs (7 Units total) into the skin at bedtime. What changed: how much to take   lactulose 10 GM/15ML solution Commonly known as: CHRONULAC Take 30 mLs (20 g total) by mouth 2 (two) times daily.   lipase/protease/amylase 12000 units Cpep capsule Commonly known as: CREON Take 1 capsule (12,000 Units total) by mouth 3 (three) times daily with meals.   multivitamin with minerals Tabs tablet Take 1 tablet by mouth daily. Start taking on: March 07, 2019   thiamine 100 MG tablet Take 1 tablet (100 mg total) by mouth daily. Start taking on: March 07, 2019   TRUEplus Lancets 28G Misc uad   TRUEresult Blood Glucose w/Device Kit uad      Follow-up [employer]. Call.   Why: For assistance with resources.  Contact information: [address] [place] [address] [phone]        Gildardo Pounds, NP. Schedule an appointment as soon as possible for a visit in 1 week(s).   Specialty: Nurse Practitioner Why: With repeat CBC/CMP Contact information: [place] [address] [phone]        Psychiatrist Follow up.   Why: At earliest convenience         No Known Allergies  Consultations: Nephrology/gastroenterology/psychiatry/palliative care   Procedures/Studies: Ct Head Wo Contrast  Result Date: 02/21/2019 CLINICAL DATA:  Altered mental status EXAM: CT HEAD WITHOUT CONTRAST TECHNIQUE: Contiguous axial images were obtained from the base of the skull through the vertex without intravenous contrast. COMPARISON:  December 02, 2018 FINDINGS: Brain: Ventricles and sulci are within normal limits. There is no intracranial mass, hemorrhage, extra-axial fluid  collection, or midline shift. Brain parenchyma appears unremarkable. No acute infarct evident. Vascular: There is no hyperdense vessel. There is calcification in each carotid siphon region. Skull: Bony calvarium appears intact. Sinuses/Orbits: There are foci of opacification in each maxillary antrum, more notable on the left than on the right. There is mucosal thickening in several ethmoid air cells. Orbits appear symmetric bilaterally. Other: There is a small air-fluid level in a posteroinferior mastoid air cell on the left. Mastoids elsewhere are clear. IMPRESSION: Brain parenchyma appears unremarkable.  No mass or hemorrhage. There are foci of arterial vascular calcification. There are areas of paranasal sinus disease. A small air-fluid level is noted in inferior posterior left mastoid air cell. Other mastoids are clear. Electronically Signed   By: Lowella Grip III M.D.   On: 02/21/2019 11:03   US Abdomen Complete  Result Date: 02/19/2019 CLINICAL DATA:  54 year old male with abnormal LFTs. EXAM: ABDOMEN ULTRASOUND COMPLETE COMPARISON:  Abdomen ultrasound 12/02/2018. CT Abdomen and Pelvis 07/10/2013. FINDINGS: Gallbladder: A shadowing echogenic stone is identified on image 5, about 6 millimeters. Gallbladder wall thickness is at the upper limits of normal to mildly increased. There is trace pericholecystic fluid on image 14. No sonographic Murphy sign elicited. Decreased sludge compared to the prior ultrasound. Common bile duct: Diameter: 7 millimeters, increased since April and mildly abnormal. No filling defect identified within the visible duct. Liver: No intrahepatic biliary ductal dilatation. Liver echogenicity within normal limits. No discrete liver lesion identified. Portal vein  is patent on color Doppler imaging with normal direction of blood flow towards the liver. IVC: No abnormality visualized. Pancreas: Not well visualized. Spleen: Not well visualized. Right Kidney: Length: 10.1 centimeters.  No hydronephrosis. Renal echogenicity within normal limits. Shadowing lower pole calculus estimated at 7-8 millimeters (image 82). Left Kidney: Length: 10.4 centimeters. Left renal echogenicity within normal limits. 1 large versus multiple shadowing calculi near the renal hilum (image 95). No left hydronephrosis. A lower pole calculus estimated at 9 millimeters is identified on image 103. Abdominal aorta: Negative for aneurysm. Positive for irregularity compatible with atherosclerosis. Other findings: Fluid distended stomach.  No free fluid. IMPRESSION: 1. Positive for cholelithiasis, borderline to mild gallbladder wall thickening, borderline to mildly dilated CBD, and trace pericholecystic fluid. No sonographic Percell Miller sign was elicited, and there is no intrahepatic biliary dilatation to strongly suggest acute duct obstruction. However, consider the possibility of choledocholithiasis. MRCP versus Nuclear Medicine Hepatobiliary Scan could be valuable in this clinical setting. 2. Nephrolithiasis. No other acute ultrasound finding in the abdomen. Electronically Signed   By: Genevie Ann M.D.   On: 02/19/2019 01:03   US Renal  Result Date: 02/22/2019 CLINICAL DATA:  Acute renal failure.  Diabetes.  Hypertension. EXAM: RENAL / URINARY TRACT ULTRASOUND COMPLETE COMPARISON:  Abdomen ultrasound dated 02/19/2019. FINDINGS: Right Kidney: Renal measurements: 10.4 x 6.0 x 5.6 cm = volume: 181 mL. 10 mm shadowing calculus in the lower pole. Borderline echogenic. No hydronephrosis. Left Kidney: Renal measurements: 10.7 x 6.0 x 6.0 cm = volume: 200 mL. 19 mm shadowing calculus in the central portion of the kidney with mild to moderate dilatation of the adjacent portion of the collecting system superiorly. There are also adjacent calculi in the lower pole of the left kidney, spanning 8 mm. Borderline echogenic. Bladder: Empty with a Foley catheter in place. Other: Mild pericholecystic fluid, similar to the recent abdomen ultrasound.  Minimal ascites. IMPRESSION: 1. 19 mm mid to lower left renal calculus causing mild to moderate dilatation of the upper pole collecting system. 2. Additional smaller, nonobstructing calculi in the lower pole of the left kidney. 3. 10 mm nonobstructing right renal calculus. 4. Borderline increased echogenicity of both kidneys, suggesting the possibility of mild medical renal disease. 5. Mild amount of pericholecystic fluid without significant change and minimal ascites. Electronically Signed   By: Claudie Revering M.D.   On: 02/22/2019 15:22   Dg Chest Port 1 View  Result Date: 02/19/2019 CLINICAL DATA:  Cough EXAM: PORTABLE CHEST 1 VIEW COMPARISON:  12/02/2018 FINDINGS: Heart and mediastinal contours are within normal limits. No focal opacities or effusions. No acute bony abnormality. IMPRESSION: No active disease. Electronically Signed   By: Rolm Baptise M.D.   On: 02/19/2019 17:42   Dg Swallowing Func-speech Pathology  Result Date: 02/27/2019 Objective Swallowing Evaluation: Type of Study: MBS-Modified Barium Swallow Study  Patient Details Name: Krist Rosenboom MRN: [id number] Date of Birth: [birth year] Today's Date: 02/27/2019 Time: SLP Start Time (ACUTE ONLY): 9767 -SLP Stop Time (ACUTE ONLY): 1402 SLP Time Calculation (min) (ACUTE ONLY): 8 min Past Medical History: Past Medical History: Diagnosis Date . Diabetes mellitus  . Hypertension  . Pancreatitis  Past Surgical History: Past Surgical History: Procedure Laterality Date . ERCP  06/27/2011  Procedure: ENDOSCOPIC RETROGRADE CHOLANGIOPANCREATOGRAPHY (ERCP);  Surgeon: Jeryl Columbia, MD;  Location: [facility];  Service: Endoscopy;  Laterality: N/A; . ERCP W/ PLASTIC STENT PLACEMENT  03/2011 HPI: Patient is a 53 y.o. male with PMH: DM-2, HTN, chronic pancreatitis due to  alcohol, who presented to hospital wtih DKA, exceedingly high liver tests with transaminases over 10,00 and possible acetaminophen toxicity from liver failure.  He was going to be transferred to  [facility] liver unit but his liver test improved with treatment at [facility].  Subjective: minimally alert to voice, tactile stim Assessment / Plan / Recommendation CHL IP CLINICAL IMPRESSIONS 02/26/2019 Clinical Impression --            Pt demonstrates mild oropharyngeal dysphagia primairly due to weakness and decreased laryngeal sensation. Pt is impulsive, takes large rapid sips regardless of verbal instruction. Despite this behavior there was no immediate aspiration. Pt did have decreased base of tongue retraction and epiglottic deflection with vallecular residue. There was trace silent frank penetration after the swallow from this pooled residue. Pt was able to complete a chin tuck with max verbal cueing which reduced residue. He could not be cued to initaite a second swallow even with visual feedback, modeling and verbal instruction. Pt had frequent coughing throughout exam, not associated with penetration or aspiration. Risk of significant aspiration is low, but pt would benefit from f/u therapy to reinforce best swallow behavior and compensatory strategies though carry over may be unlikely. Will attempt therapy trial.  SLP Visit Diagnosis Dysphagia, unspecified (R13.10) Attention and concentration deficit following -- Frontal lobe and executive function deficit following -- Impact on safety and function --   CHL IP TREATMENT RECOMMENDATION 02/24/2019 Treatment Recommendations Therapy as outlined in treatment plan below   Prognosis 02/24/2019 Prognosis for Safe Diet Advancement Fair Barriers to Reach Goals Cognitive deficits;Severity of deficits Barriers/Prognosis Comment -- CHL IP DIET RECOMMENDATION 02/26/2019 SLP Diet Recommendations -- Liquid Administration via -- Medication Administration -- Compensations Minimize environmental distractions;Slow rate;Small sips/bites;Chin tuck;Effortful swallow;Multiple dry swallows after each bite/sip Postural Changes --   CHL IP OTHER RECOMMENDATIONS 02/24/2019 Recommended Consults --  Oral Care Recommendations Oral care BID Other Recommendations --   CHL IP FOLLOW UP RECOMMENDATIONS 02/26/2019 Follow up Recommendations Skilled Nursing facility   [facility] IP FREQUENCY AND DURATION 02/24/2019 Speech Therapy Frequency (ACUTE ONLY) min 2x/week Treatment Duration 2 weeks      CHL IP ORAL PHASE 02/24/2019 Oral Phase WFL Oral - Pudding Teaspoon -- Oral - Pudding Cup -- Oral - Honey Teaspoon -- Oral - Honey Cup -- Oral - Nectar Teaspoon -- Oral - Nectar Cup -- Oral - Nectar Straw -- Oral - Thin Teaspoon -- Oral - Thin Cup -- Oral - Thin Straw -- Oral - Puree -- Oral - Mech Soft -- Oral - Regular -- Oral - Multi-Consistency -- Oral - Pill -- Oral Phase - Comment --  CHL IP PHARYNGEAL PHASE 02/24/2019 Pharyngeal Phase Impaired Pharyngeal- Pudding Teaspoon -- Pharyngeal -- Pharyngeal- Pudding Cup -- Pharyngeal -- Pharyngeal- Honey Teaspoon -- Pharyngeal -- Pharyngeal- Honey Cup -- Pharyngeal -- Pharyngeal- Nectar Teaspoon -- Pharyngeal -- Pharyngeal- Nectar Cup Pharyngeal residue - valleculae Pharyngeal -- Pharyngeal- Nectar Straw -- Pharyngeal -- Pharyngeal- Thin Teaspoon -- Pharyngeal -- Pharyngeal- Thin Cup Pharyngeal residue - valleculae;Penetration/Apiration after swallow Pharyngeal -- Pharyngeal- Thin Straw Pharyngeal residue - valleculae;Penetration/Apiration after swallow Pharyngeal Material enters airway, CONTACTS cords and not ejected out;Material enters airway, CONTACTS cords and then ejected out;Material does not enter airway Pharyngeal- Puree Pharyngeal residue - valleculae;Reduced tongue base retraction Pharyngeal -- Pharyngeal- Mechanical Soft -- Pharyngeal -- Pharyngeal- Regular Reduced tongue base retraction;Pharyngeal residue - valleculae Pharyngeal -- Pharyngeal- Multi-consistency -- Pharyngeal -- Pharyngeal- Pill -- Pharyngeal -- Pharyngeal Comment --  No flowsheet data found. DeBlois, [name] 02/27/2019,  7:49 AM                  Subjective: Patient seen and examined at bedside.  No  overnight fever, vomiting reported.  He is a poor historian.   Discharge Exam: Vitals:   03/06/19 0527 03/06/19 0818  BP: (!) 150/74 (!) 147/70  Pulse: (!) 57 63  Resp:  17  Temp: 98.1 F (36.7 C) 97.6 F (36.4 C)  SpO2: 100% 100%     General exam: Appears calm and comfortable.  Looks older than her stated age.  No acute distress.  Very poor historian.  Still slow to respond to questions but alert and awake.  Respiratory system: Bilateral decreased breath sounds at bases, no wheezing Cardiovascular system: S1 & S2 heard, intermittent bradycardia Gastrointestinal system: Abdomen is nondistended, soft and nontender. Normal bowel sounds heard. Extremities: No cyanosis, edema     The results of significant diagnostics from this hospitalization (including imaging, microbiology, ancillary and laboratory) are listed below for reference.     Microbiology: No results found for this or any previous visit (from the past 240 hour(s)).   Labs: BNP (last 3 results) No results for input(s): BNP in the last 8760 hours. Basic Metabolic Panel: Recent Labs  Lab 02/28/19 0350 03/01/19 0338 03/06/19 0343  NA 136 135 134*  K 3.3* 3.9 3.8  CL 107 106 103  CO2 23 21* 26  GLUCOSE 132* 244* 202*  BUN 22* 15 14  CREATININE 1.44* 1.32* 1.18  CALCIUM 7.9* 8.0* 7.7*  MG 1.7 1.7 1.6*   Liver Function Tests: Recent Labs  Lab 02/28/19 0350 03/01/19 0338 03/06/19 0343  AST 48* 45* 28  ALT 167* 139* 54*  ALKPHOS 382* 367* 205*  BILITOT 1.4* 1.1 1.1  PROT 4.7* 5.2* 4.8*  ALBUMIN 1.8* 1.9* 2.0*   No results for input(s): LIPASE, AMYLASE in the last 168 hours. No results for input(s): AMMONIA in the last 168 hours. CBC: Recent Labs  Lab 02/28/19 0350 03/01/19 0338 03/06/19 0343  WBC 6.8 5.4 6.2  NEUTROABS  --   --  4.1  HGB 8.2* 8.4* 8.1*  HCT 25.7* 25.9* 24.6*  MCV 92.4 91.8 92.5  PLT 149* 155 148*   Cardiac Enzymes: No results for input(s): CKTOTAL, CKMB, CKMBINDEX,  TROPONINI in the last 168 hours. BNP: Invalid input(s): POCBNP CBG: Recent Labs  Lab 03/05/19 1701 03/05/19 2021 03/06/19 0103 03/06/19 0529 03/06/19 0747  GLUCAP 73 315* 230* 191* 209*   D-Dimer No results for input(s): DDIMER in the last 72 hours. Hgb A1c No results for input(s): HGBA1C in the last 72 hours. Lipid Profile No results for input(s): CHOL, HDL, LDLCALC, TRIG, CHOLHDL, LDLDIRECT in the last 72 hours. Thyroid function studies No results for input(s): TSH, T4TOTAL, T3FREE, THYROIDAB in the last 72 hours.  Invalid input(s): FREET3 Anemia work up No results for input(s): VITAMINB12, FOLATE, FERRITIN, TIBC, IRON, RETICCTPCT in the last 72 hours. Urinalysis    Component Value Date/Time   COLORURINE AMBER (A) 02/22/2019 1758   APPEARANCEUR CLOUDY (A) 02/22/2019 1758   LABSPEC 1.012 02/22/2019 1758   PHURINE 5.0 02/22/2019 1758   GLUCOSEU 50 (A) 02/22/2019 1758   HGBUR LARGE (A) 02/22/2019 1758   BILIRUBINUR NEGATIVE 02/22/2019 1758   BILIRUBINUR negative 03/15/2018 1049   BILIRUBINUR negative 12/26/2017 0941   KETONESUR NEGATIVE 02/22/2019 1758   PROTEINUR 30 (A) 02/22/2019 1758   UROBILINOGEN 0.2 03/15/2018 1049   UROBILINOGEN 0.2 12/08/2010 1613   NITRITE POSITIVE (A) 02/22/2019  [place] (A) 02/22/2019 1758   Sepsis Labs Invalid input(s): PROCALCITONIN,  WBC,  LACTICIDVEN Microbiology No results found for this or any previous visit (from the past 240 hour(s)).   Time coordinating discharge: 35 minutes  SIGNED:   Aline August, MD  [facility] 03/06/2019, 10:14 AM

## 2019-03-06 NOTE — Progress Notes (Signed)
Pt given discharge instructions, prescriptions, and care notes. Pt verbalized understanding AEB no further questions or concerns at this time. IV was discontinued, no redness, pain, or swelling noted at this time. Pt left the floor via wheelchair with staff in stable condition & left facility in Taxi with voucher.

## 2019-03-06 NOTE — TOC Progression Note (Signed)
Transition of Care [facility]) - Progression Note    Patient Details  Name: Rodney Barber MRN: [id number] Date of Birth: 04-09-1966  Transition of Care [facility]) CM/SW [place], LCSW Phone Number: 03/06/2019, 4:17 PM  Clinical Narrative:    [facility] notified of patient's discharge and home health requirements. Patient aware of his follow up appointment scheduled at [facility]. CSW left a voicemail for patient's brother at patient's request. CSW arranged for patient's medication to be delivered to his room prior to [facility] letter utilized. Patient verified that he had cash to pay for the $3 per medication and his keys. He provided his home address to CSW for a taxi voucher, placed on shadow chart.      Barriers to Discharge: No Barriers Identified  Expected Discharge Plan and Services   In-house Referral: Clinical Social Work, Hospice / Palliative Care Discharge Planning Services: NA Post Acute Care Choice: [place] Living arrangements for the past 2 months: Single Family Home Expected Discharge Date: 03/06/19                         HH Arranged: PT, RN, OT HH Agency: [facility] Date [place]: 03/06/19 Time [place]: 1200 Representative spoke with at [place]: [place] ([place]) Interventions    Readmission Risk Interventions Readmission Risk Prevention Plan 02/26/2019 02/22/2019  Transportation Screening Complete Complete  Medication Review Press photographer) Complete Complete  PCP or Specialist appointment within 3-5 days of discharge Complete Complete  HRI or Home Care Consult Complete Complete  SW Recovery Care/Counseling Consult Complete Complete  Palliative Care Screening Complete Complete  [place] - Not Applicable  Some recent data might be hidden

## 2019-03-06 NOTE — TOC Progression Note (Signed)
Transition of Care [facility]) - Progression Note    Patient Details  Name: Rodney Barber MRN: [id number] Date of Birth: April 12, 1966  Transition of Care [facility]) CM/SW [place], LCSW Phone Number: 03/06/2019, 8:29 AM  Clinical Narrative:    CSW notes patient has been cleared by psychiatry. CSW staffed case with [place], Hassan Rowan. She reported that patient does not qualify for an LOG for SNF and he has no payor source. CSW will ask charity home health ([facility]) to review referral, but patient had services earlier this year so may not qualify again with substance use.      Barriers to Discharge: Continued Medical Work up  Expected Discharge Plan and Services   In-house Referral: Clinical Social Work, Hospice / Palliative Care Discharge Planning Services: NA Post Acute Care Choice: [place] Living arrangements for the past 2 months: Single Family Home                           HH Arranged: NA           Social Determinants of Health (SDOH) Interventions    Readmission Risk Interventions Readmission Risk Prevention Plan 02/26/2019 02/22/2019  Transportation Screening Complete Complete  Medication Review Press photographer) Complete Complete  PCP or Specialist appointment within 3-5 days of discharge Complete Complete  HRI or Home Care Consult Complete Complete  SW Recovery Care/Counseling Consult Complete Complete  Palliative Care Screening Complete Complete  [place] - Not Applicable  Some recent data might be hidden

## 2019-03-06 NOTE — Progress Notes (Signed)
Occupational Therapy Treatment Patient Details Name: Rodney Barber MRN: [id number] DOB: 06/06/1966 Today's Date: 03/06/2019    History of present illness 53 y.o. male with medical history significant for uncontrolled insulin-dependent type 2 diabetes, hypertension, chronic pancreatitis with history of chronic biliary stenosis, tobacco use, alcohol use, and substance use disorder who presents to the ED for evaluation of persistent nausea and vomiting.   OT comments  Pt is functioning modified independently in ADL and ADL transfers. He has been routinely ambulating in his room and to the bathroom.  Pt continues to impaired insight, likely baseline.  Follow Up Recommendations  No OT follow up    Equipment Recommendations  None recommended by OT    Recommendations for Other Services      Precautions / Restrictions Precautions Precautions: None       Mobility Bed Mobility Overal bed mobility: Modified Independent             General bed mobility comments: HOB up  Transfers Overall transfer level: Modified independent                    Balance Overall balance assessment: Needs assistance   Sitting balance-Leahy Scale: Good Sitting balance - Comments: no LOB with LB dressing     Standing balance-Leahy Scale: Good                             ADL either performed or assessed with clinical judgement   ADL Overall ADL's : Modified independent                                     Functional mobility during ADLs: Modified independent General ADL Comments: slower than normal pace, but does not require AD in his room     Vision       Perception     Praxis      Cognition Arousal/Alertness: Awake/alert Behavior During Therapy: Flat affect Overall Cognitive Status: No family/caregiver present to determine baseline cognitive functioning                                 General Comments: decreased insight         Exercises     Shoulder Instructions       General Comments      Pertinent Vitals/ Pain       Pain Assessment: No/denies pain  Home Living                                          Prior Functioning/Environment              Frequency  Min 2X/week        Progress Toward Goals  OT Goals(current goals can now be found in the care plan section)  Progress towards OT goals: Progressing toward goals;Goals met/education completed, patient discharged from OT  Acute Rehab OT Goals Patient Stated Goal: to be walking OT Goal Formulation: With patient  Plan All goals met and education completed, patient discharged from OT services    Co-evaluation                 AM-PAC OT "6 Clicks" Daily Activity  Outcome Measure   Help from another person eating meals?: None Help from another person taking care of personal grooming?: None Help from another person toileting, which includes using toliet, bedpan, or urinal?: None Help from another person bathing (including washing, rinsing, drying)?: None Help from another person to put on and taking off regular upper body clothing?: None Help from another person to put on and taking off regular lower body clothing?: None 6 Click Score: 24    End of Session    OT Visit Diagnosis: Other abnormalities of gait and mobility (R26.89)   Activity Tolerance Patient tolerated treatment well   Patient Left in chair;with call bell/phone within reach   Nurse Communication          Time: 1210-1225 OT Time Calculation (min): 15 min  Charges: OT General Charges $OT Visit: 1 Visit OT Treatments $Self Care/Home Management : 8-22 mins  Rodney Barber, OTR/L Acute Rehabilitation Services Pager: [phone] Office: [phone]   Malka So 03/06/2019, 1:45 PM

## 2019-03-06 NOTE — Progress Notes (Signed)
Physical Therapy Treatment Patient Details Name: Rodney Barber MRN: [id number] DOB: 1966-02-13 Today's Date: 03/06/2019    History of Present Illness Pt is a 53 y.o. male admitted 02/18/19 with persistent nausea and vomiting. Found to have DKA along with elevated LFTs and concern for acetaminophen toxicity. PMH includes uncontrolled DM2, HTN, chronic pancreatitis, polysubstance abuse.   PT Comments    Pt progressing well with mobility. Ambulating to bathroom independently in room. Hallway ambulation at supervision-level for higher level balance tasks; pt's stability improving, reports limited by fatigue. Pt planning to d/c this afternoon. Reports he can go to his home, but not sure how much longer he can pay rent; reports a couple brothers nearby who may be able to help out.    Follow Up Recommendations  No PT follow up     Equipment Recommendations  None recommended by PT    Recommendations for Other Services       Precautions / Restrictions Precautions Precautions: None Restrictions Weight Bearing Restrictions: No    Mobility  Bed Mobility Overal bed mobility: Independent             General bed mobility comments: HOB up  Transfers Overall transfer level: Independent                  Ambulation/Gait Ambulation/Gait assistance: Supervision Gait Distance (Feet): 150 Feet Assistive device: None Gait Pattern/deviations: Step-through pattern;Decreased stride length Gait velocity: Decreased Gait velocity interpretation: <1.8 ft/sec, indicate of risk for recurrent falls General Gait Details: Slow, guarded gait supervision for safety due to fall risk. Pt declined further distance due to fatigue. Higher level balance not tested   Stairs             Wheelchair Mobility    Modified Rankin (Stroke Patients Only)       Balance Overall balance assessment: Needs assistance   Sitting balance-Leahy Scale: Good Sitting balance - Comments: no LOB with  LB dressing     Standing balance-Leahy Scale: Good                              Cognition Arousal/Alertness: Awake/alert Behavior During Therapy: Flat affect Overall Cognitive Status: No family/caregiver present to determine baseline cognitive functioning                                 General Comments: decreased insight      Exercises      General Comments        Pertinent Vitals/Pain Pain Assessment: No/denies pain    Home Living                      Prior Function            PT Goals (current goals can now be found in the care plan section) Acute Rehab PT Goals Patient Stated Goal: to be walking PT Goal Formulation: With patient Time For Goal Achievement: 03/09/19 Potential to Achieve Goals: Good Progress towards PT goals: Progressing toward goals    Frequency    Min 3X/week      PT Plan Discharge plan needs to be updated    Co-evaluation              AM-PAC PT "6 Clicks" Mobility   Outcome Measure  Help needed turning from your back to your side while in a flat bed without  using bedrails?: None Help needed moving from lying on your back to sitting on the side of a flat bed without using bedrails?: None Help needed moving to and from a bed to a chair (including a wheelchair)?: None Help needed standing up from a chair using your arms (e.g., wheelchair or bedside chair)?: None Help needed to walk in hospital room?: A Little Help needed climbing 3-5 steps with a railing? : A Little 6 Click Score: 22    End of Session Equipment Utilized During Treatment: Gait belt Activity Tolerance: Patient tolerated treatment well Patient left: in bed;with call bell/phone within reach Nurse Communication: Mobility status PT Visit Diagnosis: Unsteadiness on feet (R26.81);Muscle weakness (generalized) (M62.81);Difficulty in walking, not elsewhere classified (R26.2)     Time: 5009-3818 PT Time Calculation (min) (ACUTE  ONLY): 14 min  Charges:  $Gait Training: 8-22 mins                    Ina Homes, PT, DPT Acute Rehabilitation Services  Pager [phone] Office [phone]  Malachy Chamber 03/06/2019, 2:35 PM

## 2019-04-01 ENCOUNTER — Inpatient Hospital Stay: Payer: Self-pay | Admitting: Family Medicine

## 2019-05-13 ENCOUNTER — Inpatient Hospital Stay (HOSPITAL_COMMUNITY)
Admission: EM | Admit: 2019-05-13 | Discharge: 2019-06-25 | DRG: 689 | Disposition: A | Discharge status: Home / Self Care | Payer: Self-pay | Attending: Internal Medicine | Admitting: Internal Medicine

## 2019-05-13 ENCOUNTER — Other Ambulatory Visit: Payer: Self-pay

## 2019-05-13 ENCOUNTER — Encounter (HOSPITAL_COMMUNITY): Payer: Self-pay | Admitting: Emergency Medicine

## 2019-05-13 DIAGNOSIS — E11649 Type 2 diabetes mellitus with hypoglycemia without coma: Secondary | ICD-10-CM | POA: Diagnosis not present

## 2019-05-13 DIAGNOSIS — Z833 Family history of diabetes mellitus: Secondary | ICD-10-CM

## 2019-05-13 DIAGNOSIS — E43 Unspecified severe protein-calorie malnutrition: Secondary | ICD-10-CM | POA: Diagnosis present

## 2019-05-13 DIAGNOSIS — E119 Type 2 diabetes mellitus without complications: Secondary | ICD-10-CM | POA: Diagnosis present

## 2019-05-13 DIAGNOSIS — E1169 Type 2 diabetes mellitus with other specified complication: Secondary | ICD-10-CM

## 2019-05-13 DIAGNOSIS — B9562 Methicillin resistant Staphylococcus aureus infection as the cause of diseases classified elsewhere: Secondary | ICD-10-CM | POA: Diagnosis present

## 2019-05-13 DIAGNOSIS — F1910 Other psychoactive substance abuse, uncomplicated: Secondary | ICD-10-CM | POA: Diagnosis present

## 2019-05-13 DIAGNOSIS — K861 Other chronic pancreatitis: Secondary | ICD-10-CM | POA: Diagnosis present

## 2019-05-13 DIAGNOSIS — M4622 Osteomyelitis of vertebra, cervical region: Secondary | ICD-10-CM | POA: Diagnosis present

## 2019-05-13 DIAGNOSIS — E876 Hypokalemia: Secondary | ICD-10-CM

## 2019-05-13 DIAGNOSIS — R739 Hyperglycemia, unspecified: Secondary | ICD-10-CM

## 2019-05-13 DIAGNOSIS — E878 Other disorders of electrolyte and fluid balance, not elsewhere classified: Secondary | ICD-10-CM | POA: Diagnosis present

## 2019-05-13 DIAGNOSIS — F17210 Nicotine dependence, cigarettes, uncomplicated: Secondary | ICD-10-CM | POA: Diagnosis present

## 2019-05-13 DIAGNOSIS — R7881 Bacteremia: Secondary | ICD-10-CM | POA: Diagnosis present

## 2019-05-13 DIAGNOSIS — N3000 Acute cystitis without hematuria: Principal | ICD-10-CM

## 2019-05-13 DIAGNOSIS — Z681 Body mass index (BMI) 19 or less, adult: Secondary | ICD-10-CM

## 2019-05-13 DIAGNOSIS — D649 Anemia, unspecified: Secondary | ICD-10-CM | POA: Diagnosis present

## 2019-05-13 DIAGNOSIS — L0211 Cutaneous abscess of neck: Secondary | ICD-10-CM

## 2019-05-13 DIAGNOSIS — Z794 Long term (current) use of insulin: Secondary | ICD-10-CM

## 2019-05-13 DIAGNOSIS — Z79899 Other long term (current) drug therapy: Secondary | ICD-10-CM

## 2019-05-13 DIAGNOSIS — Z23 Encounter for immunization: Secondary | ICD-10-CM

## 2019-05-13 DIAGNOSIS — E1165 Type 2 diabetes mellitus with hyperglycemia: Secondary | ICD-10-CM | POA: Diagnosis present

## 2019-05-13 DIAGNOSIS — F329 Major depressive disorder, single episode, unspecified: Secondary | ICD-10-CM | POA: Diagnosis present

## 2019-05-13 DIAGNOSIS — Z590 Homelessness: Secondary | ICD-10-CM

## 2019-05-13 DIAGNOSIS — Z720 Tobacco use: Secondary | ICD-10-CM

## 2019-05-13 DIAGNOSIS — Z9114 Patient's other noncompliance with medication regimen: Secondary | ICD-10-CM

## 2019-05-13 DIAGNOSIS — N390 Urinary tract infection, site not specified: Secondary | ICD-10-CM

## 2019-05-13 DIAGNOSIS — Z66 Do not resuscitate: Secondary | ICD-10-CM | POA: Diagnosis present

## 2019-05-13 DIAGNOSIS — M009 Pyogenic arthritis, unspecified: Secondary | ICD-10-CM | POA: Diagnosis present

## 2019-05-13 DIAGNOSIS — F321 Major depressive disorder, single episode, moderate: Secondary | ICD-10-CM

## 2019-05-13 DIAGNOSIS — M479 Spondylosis, unspecified: Secondary | ICD-10-CM | POA: Diagnosis present

## 2019-05-13 DIAGNOSIS — I10 Essential (primary) hypertension: Secondary | ICD-10-CM | POA: Diagnosis present

## 2019-05-13 DIAGNOSIS — Z20828 Contact with and (suspected) exposure to other viral communicable diseases: Secondary | ICD-10-CM | POA: Diagnosis present

## 2019-05-13 DIAGNOSIS — K860 Alcohol-induced chronic pancreatitis: Secondary | ICD-10-CM

## 2019-05-13 LAB — BASIC METABOLIC PANEL
Anion gap: 10 (ref 5–15)
BUN: 9 mg/dL (ref 6–20)
CO2: 29 mmol/L (ref 22–32)
Calcium: 8 mg/dL — ABNORMAL LOW (ref 8.9–10.3)
Chloride: 96 mmol/L — ABNORMAL LOW (ref 98–111)
Creatinine, Ser: 0.8 mg/dL (ref 0.61–1.24)
GFR calc Af Amer: >60 mL/min (ref >60)
GFR calc non Af Amer: >60 mL/min (ref >60)
Glucose, Bld: 393 mg/dL — ABNORMAL HIGH (ref 70–99)
Potassium: <2 mmol/L — CL (ref 3.5–5.1)
Sodium: 135 mmol/L (ref 135–145)

## 2019-05-13 LAB — CBC WITH DIFFERENTIAL/PLATELET
Abs Immature Granulocytes: 0.06 10*3/uL (ref 0.00–0.07)
Basophils Absolute: 0 10*3/uL (ref 0.0–0.1)
Basophils Relative: 0 %
Eosinophils Absolute: 0 10*3/uL (ref 0.0–0.5)
Eosinophils Relative: 0 %
HCT: 33.4 % — ABNORMAL LOW (ref 39.0–52.0)
Hemoglobin: 11.3 g/dL — ABNORMAL LOW (ref 13.0–17.0)
Immature Granulocytes: 1 %
Lymphocytes Relative: 18 %
Lymphs Abs: 1.9 10*3/uL (ref 0.7–4.0)
MCH: 30.8 pg (ref 26.0–34.0)
MCHC: 33.8 g/dL (ref 30.0–36.0)
MCV: 91 fL (ref 80.0–100.0)
Monocytes Absolute: 0.5 10*3/uL (ref 0.1–1.0)
Monocytes Relative: 4 %
Neutro Abs: 8.2 10*3/uL — ABNORMAL HIGH (ref 1.7–7.7)
Neutrophils Relative %: 77 %
Platelets: 280 10*3/uL (ref 150–400)
RBC: 3.67 MIL/uL — ABNORMAL LOW (ref 4.22–5.81)
RDW: 12.9 % (ref 11.5–15.5)
WBC: 10.8 10*3/uL — ABNORMAL HIGH (ref 4.0–10.5)
nRBC: 0 % (ref 0.0–0.2)

## 2019-05-13 LAB — URINALYSIS, ROUTINE W REFLEX MICROSCOPIC
Bacteria, UA: NONE SEEN
Bilirubin Urine: NEGATIVE
Glucose, UA: >=500 mg/dL — AB
Ketones, ur: NEGATIVE mg/dL
Nitrite: POSITIVE — AB
Protein, ur: 30 mg/dL — AB
Specific Gravity, Urine: 1.024 (ref 1.005–1.030)
WBC, UA: >50 WBC/hpf — ABNORMAL HIGH (ref 0–5)
pH: 6 (ref 5.0–8.0)

## 2019-05-13 LAB — CBG MONITORING, ED: Glucose-Capillary: 386 mg/dL — ABNORMAL HIGH (ref 70–99)

## 2019-05-13 MED ORDER — MAGNESIUM SULFATE IN D5W 1-5 GM/100ML-% IV SOLN
1.0000 g | Freq: Once | INTRAVENOUS | Status: AC
  Administered 2019-05-13: 1 g via INTRAVENOUS
  Filled 2019-05-13: qty 100

## 2019-05-13 MED ORDER — VITAMIN B-1 100 MG PO TABS
100.0000 mg | ORAL_TABLET | Freq: Every day | ORAL | Status: DC
  Administered 2019-05-14 – 2019-06-25 (×43): 100 mg via ORAL
  Filled 2019-05-13 (×44): qty 1

## 2019-05-13 MED ORDER — SODIUM CHLORIDE 0.9 % IV BOLUS
1000.0000 mL | Freq: Once | INTRAVENOUS | Status: AC
  Administered 2019-05-13: 1000 mL via INTRAVENOUS

## 2019-05-13 MED ORDER — POTASSIUM CHLORIDE CRYS ER 20 MEQ PO TBCR
40.0000 meq | EXTENDED_RELEASE_TABLET | ORAL | Status: AC
  Administered 2019-05-14 (×2): 40 meq via ORAL
  Filled 2019-05-13 (×3): qty 2

## 2019-05-13 MED ORDER — POTASSIUM CHLORIDE 10 MEQ/100ML IV SOLN
10.0000 meq | INTRAVENOUS | Status: AC
  Administered 2019-05-13 – 2019-05-14 (×5): 10 meq via INTRAVENOUS
  Filled 2019-05-13 (×4): qty 100

## 2019-05-13 MED ORDER — POTASSIUM CHLORIDE CRYS ER 20 MEQ PO TBCR
40.0000 meq | EXTENDED_RELEASE_TABLET | Freq: Once | ORAL | Status: AC
  Administered 2019-05-14: 40 meq via ORAL
  Filled 2019-05-13: qty 2

## 2019-05-13 MED ORDER — MAGNESIUM SULFATE 50 % IJ SOLN: 1.0000 g | Freq: Once | INTRAMUSCULAR | Status: DC

## 2019-05-13 MED ORDER — NICOTINE 14 MG/24HR TD PT24
14.0000 mg | MEDICATED_PATCH | Freq: Every day | TRANSDERMAL | Status: DC
  Administered 2019-05-14 – 2019-06-25 (×43): 14 mg via TRANSDERMAL
  Filled 2019-05-13 (×43): qty 1

## 2019-05-13 MED ORDER — ENOXAPARIN SODIUM 40 MG/0.4ML ~~LOC~~ SOLN
40.0000 mg | Freq: Every day | SUBCUTANEOUS | Status: DC
  Administered 2019-05-14 (×2): 40 mg via SUBCUTANEOUS
  Filled 2019-05-13 (×2): qty 0.4

## 2019-05-13 MED ORDER — SODIUM CHLORIDE 0.9 % IV SOLN
2.0000 g | Freq: Once | INTRAVENOUS | Status: AC
  Administered 2019-05-13: 2 g via INTRAVENOUS
  Filled 2019-05-13: qty 20

## 2019-05-13 MED ORDER — ACETAMINOPHEN 325 MG PO TABS
650.0000 mg | ORAL_TABLET | Freq: Four times a day (QID) | ORAL | Status: DC | PRN
  Administered 2019-05-14 – 2019-06-16 (×15): 650 mg via ORAL
  Filled 2019-05-13 (×15): qty 2

## 2019-05-13 MED ORDER — PANCRELIPASE (LIP-PROT-AMYL) 12000-38000 UNITS PO CPEP
12000.0000 [IU] | ORAL_CAPSULE | Freq: Three times a day (TID) | ORAL | Status: DC
  Administered 2019-05-14 – 2019-06-25 (×124): 12000 [IU] via ORAL
  Filled 2019-05-13 (×126): qty 1

## 2019-05-13 MED ORDER — ACETAMINOPHEN 650 MG RE SUPP: 650.0000 mg | Freq: Four times a day (QID) | RECTAL | Status: DC | PRN

## 2019-05-13 MED ORDER — ESCITALOPRAM OXALATE 10 MG PO TABS
5.0000 mg | ORAL_TABLET | Freq: Every day | ORAL | Status: DC
  Administered 2019-05-14 – 2019-06-25 (×43): 5 mg via ORAL
  Filled 2019-05-13 (×44): qty 1

## 2019-05-13 MED ORDER — SODIUM CHLORIDE 0.9 % IV SOLN
INTRAVENOUS | Status: AC
  Administered 2019-05-14 (×2): via INTRAVENOUS

## 2019-05-13 MED ORDER — SODIUM CHLORIDE 0.9 % IV SOLN
1.0000 g | INTRAVENOUS | Status: DC
  Administered 2019-05-14: 1 g via INTRAVENOUS
  Filled 2019-05-13 (×2): qty 10

## 2019-05-13 MED ORDER — ADULT MULTIVITAMIN W/MINERALS CH
1.0000 | ORAL_TABLET | Freq: Every day | ORAL | Status: DC
  Administered 2019-05-14 – 2019-06-25 (×43): 1 via ORAL
  Filled 2019-05-13 (×43): qty 1

## 2019-05-13 MED ORDER — FOLIC ACID 1 MG PO TABS
1.0000 mg | ORAL_TABLET | Freq: Every day | ORAL | Status: DC
  Administered 2019-05-14 – 2019-06-25 (×43): 1 mg via ORAL
  Filled 2019-05-13 (×43): qty 1

## 2019-05-13 MED ORDER — AMLODIPINE BESYLATE 5 MG PO TABS
5.0000 mg | ORAL_TABLET | Freq: Every day | ORAL | Status: DC
  Administered 2019-05-14 – 2019-06-25 (×43): 5 mg via ORAL
  Filled 2019-05-13 (×44): qty 1

## 2019-05-13 NOTE — H&P (Signed)
History and Physical    Rodney Barber CWC:[id number] DOB: [birth year] DOA: 05/13/2019  PCP: Gildardo Pounds, NP Patient coming from: [facility]  Chief Complaint: Generalized weakness  HPI: Rodney Barber is a 53 y.o. male with medical history significant of insulin-dependent diabetes mellitus, hypertension, chronic pancreatitis, tobacco use presenting to the hospital for evaluation of generalized weakness.  Found to be hyperglycemic by EMS with CBG 515 and total 1 L fluid bolus was given.  Patient reports 2-day history of generalized weakness.  States he ran out of his home insulin a few days ago.  Denies fevers, chills, cough, shortness of breath, chest pain, nausea, vomiting, or abdominal pain.  Denies dysuria but does report urinary frequency and urgency.  States he takes lactulose at home and has been having diarrhea.  ED Course: Blood pressure slightly elevated, remainder of vitals stable.  White count 10.8.  Potassium <2.0.  EKG with slight U waves.  Creatinine 0.8. Blood glucose 393.  Bicarb 29, anion gap 10.  UA with evidence of glucosuria but no ketones.  UA does suggest infection with positive nitrite, large amount of leukocytes, and greater than 50 WBCs.  Urine culture pending. Patient received potassium supplementation, ceftriaxone, IV magnesium 1 g, and an additional 1 L fluid bolus in the ED.  Review of Systems:  All systems reviewed and apart from history of presenting illness, are negative.  Past Medical History:  Diagnosis Date  . Diabetes mellitus   . Hypertension   . Pancreatitis     Past Surgical History:  Procedure Laterality Date  . ERCP  06/27/2011   Procedure: ENDOSCOPIC RETROGRADE CHOLANGIOPANCREATOGRAPHY (ERCP);  Surgeon: Jeryl Columbia, MD;  Location: [facility];  Service: Endoscopy;  Laterality: N/A;  . ERCP W/ PLASTIC STENT PLACEMENT  03/2011     reports that he has been smoking cigarettes. He has a 15.00 pack-year smoking history. He has never used smokeless  tobacco. He reports previous drug use. Drugs: "Crack" cocaine, Other-see comments, and Cocaine. He reports that he does not drink alcohol.  No Known Allergies  Family History  Problem Relation Age of Onset  . Diabetes Mother   . Diabetes Brother     Prior to Admission medications   Medication Sig Start Date End Date Taking? Authorizing Provider  amLODipine (NORVASC) 5 MG tablet Take 1 tablet (5 mg total) by mouth daily. 03/07/19  Yes Aline August, MD  escitalopram (LEXAPRO) 10 MG tablet Take 0.5 tablets (5 mg total) by mouth daily for 30 days. 12/17/18 05/13/19 Yes Kerin Perna, NP  folic acid (FOLVITE) 1 MG tablet Take 1 tablet (1 mg total) by mouth daily. 03/07/19  Yes Aline August, MD  insulin detemir (LEVEMIR) 100 UNIT/ML injection Inject 0.07 mLs (7 Units total) into the skin at bedtime. 03/06/19  Yes Aline August, MD  lactulose (CHRONULAC) 10 GM/15ML solution Take 30 mLs (20 g total) by mouth 2 (two) times daily. 03/06/19  Yes Aline August, MD  lipase/protease/amylase (CREON) 12000 units CPEP capsule Take 1 capsule (12,000 Units total) by mouth 3 (three) times daily with meals. 12/17/18  Yes Kerin Perna, NP  Multiple Vitamin (MULTIVITAMIN WITH MINERALS) TABS tablet Take 1 tablet by mouth daily. 03/07/19  Yes Aline August, MD  thiamine 100 MG tablet Take 1 tablet (100 mg total) by mouth daily. 03/07/19  Yes Aline August, MD  Blood Glucose Monitoring Suppl (TRUERESULT BLOOD GLUCOSE) w/Device KIT uad 04/23/17   Hairston, Toy Baker R, FNP  glucose blood (TRUE METRIX BLOOD GLUCOSE TEST)  test strip Use as instructed 12/17/18   Kerin Perna, NP  TRUEPLUS LANCETS 28G MISC uad 04/23/17   Alfonse Spruce, FNP    Physical Exam: Vitals:   05/13/19 1924 05/13/19 2300  BP: (!) 166/85 (!) 172/87  Pulse: 64 63  Resp: 18 15  Temp: 98.4 F (36.9 C)   TempSrc: Oral   SpO2: 100% 97%    Physical Exam  Constitutional: He is oriented to person, place, and time. No  distress.  HENT:  Head: Normocephalic.  Mouth/Throat: Oropharynx is clear and moist.  Eyes: Right eye exhibits no discharge. Left eye exhibits no discharge.  Neck: Neck supple.  Cardiovascular: Normal rate, regular rhythm and intact distal pulses.  Pulmonary/Chest: Effort normal and breath sounds normal. No respiratory distress. He has no wheezes. He has no rales.  Abdominal: Soft. Bowel sounds are normal. He exhibits no distension. There is no abdominal tenderness. There is no guarding.  Musculoskeletal:        General: No edema.  Neurological: He is alert and oriented to person, place, and time.  Skin: Skin is warm and dry. He is not diaphoretic.     Labs on Admission: I have personally reviewed following labs and imaging studies  CBC: Recent Labs  Lab 05/13/19 2035  WBC 10.8*  NEUTROABS 8.2*  HGB 11.3*  HCT 33.4*  MCV 91.0  PLT 098   Basic Metabolic Panel: Recent Labs  Lab 05/13/19 2035  NA 135  K <2.0*  CL 96*  CO2 29  GLUCOSE 393*  BUN 9  CREATININE 0.80  CALCIUM 8.0*   GFR: CrCl cannot be calculated (Unknown ideal weight.). Liver Function Tests: No results for input(s): AST, ALT, ALKPHOS, BILITOT, PROT, ALBUMIN in the last 168 hours. No results for input(s): LIPASE, AMYLASE in the last 168 hours. No results for input(s): AMMONIA in the last 168 hours. Coagulation Profile: No results for input(s): INR, PROTIME in the last 168 hours. Cardiac Enzymes: No results for input(s): CKTOTAL, CKMB, CKMBINDEX, TROPONINI in the last 168 hours. BNP (last 3 results) No results for input(s): PROBNP in the last 8760 hours. HbA1C: No results for input(s): HGBA1C in the last 72 hours. CBG: Recent Labs  Lab 05/13/19 1925 05/14/19 0003  GLUCAP 386* 300*   Lipid Profile: No results for input(s): CHOL, HDL, LDLCALC, TRIG, CHOLHDL, LDLDIRECT in the last 72 hours. Thyroid Function Tests: No results for input(s): TSH, T4TOTAL, FREET4, T3FREE, THYROIDAB in the last 72  hours. Anemia Panel: No results for input(s): VITAMINB12, FOLATE, FERRITIN, TIBC, IRON, RETICCTPCT in the last 72 hours. Urine analysis:    Component Value Date/Time   COLORURINE STRAW (A) 05/13/2019 2028   APPEARANCEUR HAZY (A) 05/13/2019 2028   LABSPEC 1.024 05/13/2019 2028   PHURINE 6.0 05/13/2019 2028   GLUCOSEU >=500 (A) 05/13/2019 2028   HGBUR SMALL (A) 05/13/2019 2028   BILIRUBINUR NEGATIVE 05/13/2019 2028   BILIRUBINUR negative 03/15/2018 1049   BILIRUBINUR negative 12/26/2017 0941   KETONESUR NEGATIVE 05/13/2019 2028   PROTEINUR 30 (A) 05/13/2019 2028   UROBILINOGEN 0.2 03/15/2018 1049   UROBILINOGEN 0.2 12/08/2010 1613   NITRITE POSITIVE (A) 05/13/2019 2028   LEUKOCYTESUR LARGE (A) 05/13/2019 2028    Radiological Exams on Admission: No results found.  EKG: Independently reviewed.  Sinus rhythm, slight U waves.  Borderline PR prolongation.  Assessment/Plan Principal Problem:   Hypokalemia Active Problems:   Uncontrolled type 2 diabetes mellitus ([place])   UTI (urinary tract infection)   Hyperglycemia   Tobacco  use   Severe hypokalemia Likely due to GI loss in the setting of diarrhea from home lactulose use.  Patient is not on a diuretic.  Potassium <2.0.  EKG with slight U waves.  -Cardiac monitoring -Replete potassium and magnesium.  Continue to monitor levels very closely. -Serial EKGs -Check high-sensitivity troponin level -Hold lactulose  UTI Afebrile.  White count mildly elevated.  No signs of sepsis.  UA with positive nitrite, large amount of leukocytes, and greater than 50 WBCs. -Ceftriaxone -Urine culture -Continue to monitor WBC count  Hyperglycemia, uncontrolled insulin-dependent diabetes mellitus Last A1c 17.7 on 02/18/2019.  Patient states he ran out of his home insulin a few days ago.  CBG 515 per EMS and improved to 393 with 1 L fluid bolus.  No signs of DKA. -Repeat A1c -Continue IV fluid hydration -Avoid giving insulin at this time given  severe hypokalemia with EKG changes. -Monitor CBG every 4 hours.  Insulin can be resumed after hypokalemia resolves.   Generalized weakness Likely related to severe hypokalemia. -Management as mentioned above  Tobacco use -NicoDerm patch -Counseling  Hypertension Blood pressure elevated with systolic in the 470L to 295F. -Continue home amlodipine -Hydralazine PRN  Depression -Continue home Lexapro  Chronic pancreatitis -Continue home Creon  DVT prophylaxis: Lovenox Code Status: Patient wishes to be DNR. Family Communication: No family available. Disposition Plan: Anticipate discharge after clinical improvement. Consults called: None Admission status: It is my clinical opinion that referral for OBSERVATION is reasonable and necessary in this patient based on the above information provided. The aforementioned taken together are felt to place the patient at high risk for further clinical deterioration. However it is anticipated that the patient may be medically stable for discharge from the hospital within 24 to 48 hours.  The medical decision making on this patient was of high complexity and the patient is at high risk for clinical deterioration, therefore this is a level 3 visit.  Shela Leff MD [facility] Pager [phone]  If 7PM-7AM, please contact night-coverage [email or web address] Password [email or web address]  05/14/2019, 12:21 AM

## 2019-05-13 NOTE — ED Provider Notes (Signed)
[facility] DEPT Provider Note   CSN: [id number] Arrival date & time: 05/13/19  1909     History   Chief Complaint Chief Complaint  Patient presents with  . Hyperglycemia  . Weakness    HPI Rodney Barber is a 53 y.o. male.     The history is provided by the patient.  Hyperglycemia Blood sugar level PTA:  >500 Severity:  Mild Onset quality:  Gradual Timing:  Constant Progression:  Unchanged Chronicity:  New Diabetes status:  Controlled with insulin Context: noncompliance   Relieved by:  Nothing Ineffective treatments:  None tried Associated symptoms: fatigue and weakness (general )   Associated symptoms: no abdominal pain, no blurred vision, no chest pain, no dehydration, no dysuria, no fever, no shortness of breath and no vomiting   Risk factors: hx of DKA     Past Medical History:  Diagnosis Date  . Diabetes mellitus   . Hypertension   . Pancreatitis     Patient Active Problem List   Diagnosis Date Noted  . Suicide attempt ([place]) 02/26/2019  . Acetaminophen toxicity   . Goals of care, counseling/discussion   . Palliative care by specialist   . Shock liver 02/18/2019  . Hyperosmolar non-ketotic state in patient with type 2 diabetes mellitus ([place]) 02/18/2019  . Alcohol use 02/18/2019  . Substance use disorder 02/18/2019  . AKI (acute kidney injury) ([place])   . Altered behavior   . Altered mental status 12/02/2018  . Abnormal liver function 12/02/2018  . AMS (altered mental status) 12/02/2018  . Cough 12/02/2018  . Uncontrolled type 2 diabetes mellitus ([place]) 03/15/2018  . Urine test positive for microalbuminuria 07/12/2017  . Type 2 diabetes mellitus with other specified complication ([place]) 14/43/1540  . Liver lesion 07/22/2013  . 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 Diabetes ([place]) 07/18/2013  . Hypokalemia 07/11/2013  . Protein-calorie malnutrition, severe ([place]) 07/11/2013  . Chronic pancreatitis  ([place]) 07/10/2013  . 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 Diabetes mellitus ([place]) 07/10/2013  . Weight loss 07/10/2013  . Diarrhea 07/10/2013  . Liver lesion, right lobe 07/10/2013    Past Surgical History:  Procedure Laterality Date  . ERCP  06/27/2011   Procedure: ENDOSCOPIC RETROGRADE CHOLANGIOPANCREATOGRAPHY (ERCP);  Surgeon: Jeryl Columbia, MD;  Location: [facility];  Service: Endoscopy;  Laterality: N/A;  . ERCP W/ PLASTIC STENT PLACEMENT  03/2011        Home Medications    Prior to Admission medications   Medication Sig Start Date End Date Taking? Authorizing Provider  amLODipine (NORVASC) 5 MG tablet Take 1 tablet (5 mg total) by mouth daily. 03/07/19  Yes Aline August, MD  escitalopram (LEXAPRO) 10 MG tablet Take 0.5 tablets (5 mg total) by mouth daily for 30 days. 12/17/18 05/13/19 Yes Kerin Perna, NP  folic acid (FOLVITE) 1 MG tablet Take 1 tablet (1 mg total) by mouth daily. 03/07/19  Yes Aline August, MD  insulin detemir (LEVEMIR) 100 UNIT/ML injection Inject 0.07 mLs (7 Units total) into the skin at bedtime. 03/06/19  Yes Aline August, MD  lactulose (CHRONULAC) 10 GM/15ML solution Take 30 mLs (20 g total) by mouth 2 (two) times daily. 03/06/19  Yes Aline August, MD  lipase/protease/amylase (CREON) 12000 units CPEP capsule Take 1 capsule (12,000 Units total) by mouth 3 (three) times daily with meals. 12/17/18  Yes Kerin Perna, NP  Multiple Vitamin (MULTIVITAMIN WITH MINERALS) TABS tablet Take 1 tablet by mouth daily. 03/07/19  Yes Alekh,  Kshitiz, MD  thiamine 100 MG tablet Take 1 tablet (100 mg total) by mouth daily. 03/07/19  Yes Aline August, MD  Blood Glucose Monitoring Suppl (TRUERESULT BLOOD GLUCOSE) w/Device KIT uad 04/23/17   [name] R, FNP  glucose blood (TRUE METRIX BLOOD GLUCOSE TEST) test strip Use as instructed 12/17/18   Kerin Perna, NP  TRUEPLUS LANCETS 28G MISC uad 04/23/17   Alfonse Spruce, FNP    Family  History Family History  Problem Relation Age of Onset  . Diabetes Mother   . Diabetes Brother     Social History Social History   Tobacco Use  . Smoking status: Current Every Day Smoker    Packs/day: 0.50    Years: 30.00    Pack years: 15.00    Types: Cigarettes  . Smokeless tobacco: Never Used  . Tobacco comment: 6 cigarett /day  Substance Use Topics  . Alcohol use: No  . Drug use: Not Currently    Types: "Crack" cocaine, Other-see comments, Cocaine    Comment: last smoked crack 12-16-16     Allergies   Patient has no known allergies.   Review of Systems Review of Systems  Constitutional: Positive for fatigue. Negative for chills and fever.  HENT: Negative for ear pain and sore throat.   Eyes: Negative for blurred vision, pain and visual disturbance.  Respiratory: Negative for cough and shortness of breath.   Cardiovascular: Negative for chest pain and palpitations.  Gastrointestinal: Negative for abdominal pain and vomiting.  Genitourinary: Negative for dysuria and hematuria.  Musculoskeletal: Negative for arthralgias and back pain.  Skin: Negative for color change and rash.  Neurological: Positive for weakness (general ). Negative for seizures and syncope.  All other systems reviewed and are negative.    Physical Exam Updated Vital Signs  ED Triage Vitals  Enc Vitals Group     BP 05/13/19 1924 (!) 166/85     Pulse Rate 05/13/19 1924 64     Resp 05/13/19 1924 18     Temp 05/13/19 1924 98.4 F (36.9 C)     Temp Source 05/13/19 1924 Oral     SpO2 05/13/19 1924 100 %     Weight --      Height --      Head Circumference --      Peak Flow --      Pain Score 05/13/19 1925 5     Pain Loc --      Pain Edu? --      Excl. in [place]? --     Physical Exam Vitals signs and nursing note reviewed.  Constitutional:      Appearance: He is well-developed. He is ill-appearing (chronic, cachetic).  HENT:     Head: Normocephalic and atraumatic.     Nose: Nose normal.      Mouth/Throat:     Mouth: Mucous membranes are moist.  Eyes:     Extraocular Movements: Extraocular movements intact.     Conjunctiva/sclera: Conjunctivae normal.     Pupils: Pupils are equal, round, and reactive to light.  Neck:     Musculoskeletal: Neck supple. No muscular tenderness.  Cardiovascular:     Rate and Rhythm: Normal rate and regular rhythm.     Pulses: Normal pulses.     Heart sounds: Normal heart sounds. No murmur.  Pulmonary:     Effort: Pulmonary effort is normal. No respiratory distress.     Breath sounds: Normal breath sounds.  Abdominal:     General:  Abdomen is flat.     Palpations: Abdomen is soft.     Tenderness: There is no abdominal tenderness.  Skin:    General: Skin is warm and dry.     Capillary Refill: Capillary refill takes less than 2 seconds.  Neurological:     General: No focal deficit present.     Mental Status: He is alert and oriented to person, place, and time.     Cranial Nerves: No cranial nerve deficit.     Sensory: No sensory deficit.     Motor: No weakness.     Coordination: Coordination normal.     Gait: Gait normal.  Psychiatric:        Mood and Affect: Mood normal.      ED Treatments / Results  Labs (all labs ordered are listed, but only abnormal results are displayed) Labs Reviewed  CBC WITH DIFFERENTIAL/PLATELET - Abnormal; Notable for the following components:      Result Value   WBC 10.8 (*)    RBC 3.67 (*)    Hemoglobin 11.3 (*)    HCT 33.4 (*)    Neutro Abs 8.2 (*)    All other components within normal limits  BASIC METABOLIC PANEL - Abnormal; Notable for the following components:   Potassium <2.0 (*)    Chloride 96 (*)    Glucose, Bld 393 (*)    Calcium 8.0 (*)    All other components within normal limits  URINALYSIS, ROUTINE W REFLEX MICROSCOPIC - Abnormal; Notable for the following components:   Color, Urine STRAW (*)    APPearance HAZY (*)    Glucose, UA >=500 (*)    Hgb urine dipstick SMALL (*)     Protein, ur 30 (*)    Nitrite POSITIVE (*)    Leukocytes,Ua LARGE (*)    WBC, UA >50 (*)    All other components within normal limits  CBG MONITORING, ED - Abnormal; Notable for the following components:   Glucose-Capillary 386 (*)    All other components within normal limits  SARS CORONAVIRUS 2 (TAT 6-24 HRS)  URINE CULTURE  CBC  MAGNESIUM  POTASSIUM  POTASSIUM  POTASSIUM  TROPONIN I (HIGH SENSITIVITY)    EKG EKG Interpretation  Date/Time:  Tuesday May 13 2019 19:57:18 EDT Ventricular Rate:  60 PR Interval:    QRS Duration: 97 QT Interval:  413 QTC Calculation: 413 R Axis:   56 Text Interpretation:  Sinus rhythm Borderline prolonged PR interval RSR' in V1 or V2, probably normal variant Confirmed by Lennice Sites [phone]) on 05/13/2019 8:00:39 PM   Radiology No results found.  Procedures .Critical Care Performed by: Lennice Sites, DO Authorized by: Lennice Sites, DO   Critical care provider statement:    Critical care time (minutes):  40   Critical care was necessary to treat or prevent imminent or life-threatening deterioration of the following conditions:  Metabolic crisis   Critical care was time spent personally by me on the following activities:  Blood draw for specimens, development of treatment plan with patient or surrogate, evaluation of patient's response to treatment, examination of patient, ordering and performing treatments and interventions, ordering and review of laboratory studies, ordering and review of radiographic studies, pulse oximetry, review of old charts, re-evaluation of patient's condition, discussions with primary provider and obtaining history from patient or surrogate   I assumed direction of critical care for this patient from another provider in my specialty: no     (including critical care time)  Medications  Ordered in ED Medications  potassium chloride 10 mEq in 100 mL IVPB (10 mEq Intravenous New Bag/Given 05/13/19 2257)   potassium chloride SA (KLOR-CON) CR tablet 40 mEq (has no administration in time range)  cefTRIAXone (ROCEPHIN) 2 g in sodium chloride 0.9 % 100 mL IVPB (2 g Intravenous New Bag/Given 05/13/19 2305)  cefTRIAXone (ROCEPHIN) 1 g in sodium chloride 0.9 % 100 mL IVPB (has no administration in time range)  potassium chloride SA (KLOR-CON) CR tablet 40 mEq (has no administration in time range)  escitalopram (LEXAPRO) tablet 5 mg (has no administration in time range)  amLODipine (NORVASC) tablet 5 mg (has no administration in time range)  lipase/protease/amylase (CREON) capsule 12,000 Units (has no administration in time range)  folic acid (FOLVITE) tablet 1 mg (has no administration in time range)  multivitamin with minerals tablet 1 tablet (has no administration in time range)  thiamine (VITAMIN B-1) tablet 100 mg (has no administration in time range)  nicotine (NICODERM CQ - dosed in mg/24 hours) patch 14 mg (has no administration in time range)  0.9 %  sodium chloride infusion (has no administration in time range)  enoxaparin (LOVENOX) injection 40 mg (has no administration in time range)  acetaminophen (TYLENOL) tablet 650 mg (has no administration in time range)    Or  acetaminophen (TYLENOL) suppository 650 mg (has no administration in time range)  sodium chloride 0.9 % bolus 1,000 mL (1,000 mLs Intravenous New Bag/Given 05/13/19 2000)  magnesium sulfate IVPB 1 g 100 mL (1 g Intravenous New Bag/Given 05/13/19 2223)     Initial Impression / Assessment and Plan / ED Course  I have reviewed the triage vital signs and the nursing notes.  Pertinent labs & imaging results that were available during my care of the patient were reviewed by me and considered in my medical decision making (see chart for details).     Rodney Barber is a 53 year old male with history of diabetes who presents the ED with elevated blood sugar, generalized weakness.  Patient with unremarkable vitals.  No fever.   Patient mostly asymptomatic but still feels generally weak.  Denies any nausea, vomiting, abdominal pain.  Has chronic neck pain.  Normal neurological exam.  Patient appears cachectic.  Appears malnutrition.  Will give IV fluids, check labs.  EKG showed sinus rhythm.  Has some T wave inversions throughout but does not have any chest pain.  Potassium less than 2 but otherwise has hyperglycemia in the 300s.  Otherwise lab work unremarkable.  Given severe hypokalemia will admit for repletion.  Patient given several rounds of IV potassium as well as oral potassium.  Kidney function is normal.  Patient also with urinary tract infection will treat with Rocephin.  No concern for sepsis. Unsure of etiology of hypokalemia but will admit to medicine for further care.  Hemodynamically stable throughout my care.  This chart was dictated using voice recognition software.  Despite best efforts to proofread,  errors can occur which can change the documentation meaning.    Final Clinical Impressions(s) / ED Diagnoses   Final diagnoses:  Hypokalemia  Hyperglycemia  Acute cystitis without hematuria    ED Discharge Orders    None       Lennice Sites, DO 05/13/19 2327

## 2019-05-13 NOTE — ED Triage Notes (Signed)
Pt from home called EMS for generalized weakness neuro intact ambulatory with assistance CBG 515 5oo ml bolus 0.9nss intially given second bag  500 ml on pole with 100 ml infused. Repeat CBG in ED 386

## 2019-05-14 ENCOUNTER — Other Ambulatory Visit: Payer: Self-pay

## 2019-05-14 DIAGNOSIS — R739 Hyperglycemia, unspecified: Secondary | ICD-10-CM

## 2019-05-14 DIAGNOSIS — N390 Urinary tract infection, site not specified: Secondary | ICD-10-CM

## 2019-05-14 DIAGNOSIS — Z720 Tobacco use: Secondary | ICD-10-CM

## 2019-05-14 DIAGNOSIS — E878 Other disorders of electrolyte and fluid balance, not elsewhere classified: Secondary | ICD-10-CM | POA: Diagnosis present

## 2019-05-14 LAB — CBC
HCT: 35.6 % — ABNORMAL LOW (ref 39.0–52.0)
Hemoglobin: 11.6 g/dL — ABNORMAL LOW (ref 13.0–17.0)
MCH: 30.1 pg (ref 26.0–34.0)
MCHC: 32.6 g/dL (ref 30.0–36.0)
MCV: 92.5 fL (ref 80.0–100.0)
Platelets: 288 10*3/uL (ref 150–400)
RBC: 3.85 MIL/uL — ABNORMAL LOW (ref 4.22–5.81)
RDW: 13 % (ref 11.5–15.5)
WBC: 11.2 10*3/uL — ABNORMAL HIGH (ref 4.0–10.5)
nRBC: 0 % (ref 0.0–0.2)

## 2019-05-14 LAB — COMPREHENSIVE METABOLIC PANEL
ALT: 18 U/L (ref 0–44)
AST: 37 U/L (ref 15–41)
Albumin: 2.5 g/dL — ABNORMAL LOW (ref 3.5–5.0)
Alkaline Phosphatase: 218 U/L — ABNORMAL HIGH (ref 38–126)
Anion gap: 7 (ref 5–15)
BUN: 8 mg/dL (ref 6–20)
CO2: 26 mmol/L (ref 22–32)
Calcium: 7.5 mg/dL — ABNORMAL LOW (ref 8.9–10.3)
Chloride: 106 mmol/L (ref 98–111)
Creatinine, Ser: 0.6 mg/dL — ABNORMAL LOW (ref 0.61–1.24)
GFR calc Af Amer: >60 mL/min (ref >60)
GFR calc non Af Amer: >60 mL/min (ref >60)
Glucose, Bld: 113 mg/dL — ABNORMAL HIGH (ref 70–99)
Potassium: 2.9 mmol/L — ABNORMAL LOW (ref 3.5–5.1)
Sodium: 139 mmol/L (ref 135–145)
Total Bilirubin: 0.7 mg/dL (ref 0.3–1.2)
Total Protein: 5.2 g/dL — ABNORMAL LOW (ref 6.5–8.1)

## 2019-05-14 LAB — CBG MONITORING, ED: Glucose-Capillary: 300 mg/dL — ABNORMAL HIGH (ref 70–99)

## 2019-05-14 LAB — POTASSIUM
Potassium: 2.7 mmol/L — CL (ref 3.5–5.1)
Potassium: 3 mmol/L — ABNORMAL LOW (ref 3.5–5.1)

## 2019-05-14 LAB — GLUCOSE, CAPILLARY
Glucose-Capillary: 297 mg/dL — ABNORMAL HIGH (ref 70–99)
Glucose-Capillary: 382 mg/dL — ABNORMAL HIGH (ref 70–99)
Glucose-Capillary: 348 mg/dL — ABNORMAL HIGH (ref 70–99)
Glucose-Capillary: 127 mg/dL — ABNORMAL HIGH (ref 70–99)
Glucose-Capillary: 223 mg/dL — ABNORMAL HIGH (ref 70–99)

## 2019-05-14 LAB — SARS CORONAVIRUS 2 (TAT 6-24 HRS): SARS Coronavirus 2: NEGATIVE

## 2019-05-14 LAB — MAGNESIUM: Magnesium: 1.8 mg/dL (ref 1.7–2.4)

## 2019-05-14 LAB — PHOSPHORUS: Phosphorus: <1 mg/dL — CL (ref 2.5–4.6)

## 2019-05-14 MED ORDER — POTASSIUM & SODIUM PHOSPHATES 280-160-250 MG PO PACK
2.0000 | PACK | Freq: Three times a day (TID) | ORAL | Status: DC
  Administered 2019-05-14 – 2019-05-15 (×6): 2 via ORAL
  Filled 2019-05-14 (×8): qty 2

## 2019-05-14 MED ORDER — INSULIN ASPART 100 UNIT/ML ~~LOC~~ SOLN
0.0000 [IU] | Freq: Three times a day (TID) | SUBCUTANEOUS | Status: DC
  Administered 2019-05-14: 17:00:00 3 [IU] via SUBCUTANEOUS
  Administered 2019-05-14: 9 [IU] via SUBCUTANEOUS
  Administered 2019-05-14: 13:00:00 1 [IU] via SUBCUTANEOUS
  Administered 2019-05-15: 2 [IU] via SUBCUTANEOUS
  Administered 2019-05-15: 9 [IU] via SUBCUTANEOUS
  Administered 2019-05-15: 1 [IU] via SUBCUTANEOUS
  Administered 2019-05-17: 16:00:00 7 [IU] via SUBCUTANEOUS
  Administered 2019-05-17: 3 [IU] via SUBCUTANEOUS
  Administered 2019-05-17 – 2019-05-18 (×2): 5 [IU] via SUBCUTANEOUS
  Administered 2019-05-18: 9 [IU] via SUBCUTANEOUS
  Administered 2019-05-18: 18:00:00 1 [IU] via SUBCUTANEOUS
  Administered 2019-05-19: 17:00:00 5 [IU] via SUBCUTANEOUS
  Administered 2019-05-19: 3 [IU] via SUBCUTANEOUS
  Administered 2019-05-19: 7 [IU] via SUBCUTANEOUS
  Administered 2019-05-21 – 2019-05-22 (×3): 9 [IU] via SUBCUTANEOUS
  Administered 2019-05-22 – 2019-05-24 (×4): 3 [IU] via SUBCUTANEOUS
  Administered 2019-05-24: 9 [IU] via SUBCUTANEOUS
  Administered 2019-05-24 – 2019-05-26 (×3): 2 [IU] via SUBCUTANEOUS
  Administered 2019-05-27: 18:00:00 1 [IU] via SUBCUTANEOUS
  Administered 2019-05-27: 13:00:00 3 [IU] via SUBCUTANEOUS
  Administered 2019-05-28: 09:00:00 1 [IU] via SUBCUTANEOUS
  Administered 2019-05-29: 5 [IU] via SUBCUTANEOUS
  Administered 2019-05-29: 1 [IU] via SUBCUTANEOUS
  Administered 2019-05-29 – 2019-05-30 (×2): 2 [IU] via SUBCUTANEOUS
  Administered 2019-05-30: 12:00:00 1 [IU] via SUBCUTANEOUS
  Administered 2019-05-30: 5 [IU] via SUBCUTANEOUS
  Administered 2019-05-31: 3 [IU] via SUBCUTANEOUS
  Administered 2019-05-31: 1 [IU] via SUBCUTANEOUS
  Administered 2019-06-01: 7 [IU] via SUBCUTANEOUS
  Administered 2019-06-01 – 2019-06-02 (×2): 2 [IU] via SUBCUTANEOUS
  Administered 2019-06-02 – 2019-06-03 (×2): 1 [IU] via SUBCUTANEOUS
  Administered 2019-06-03: 2 [IU] via SUBCUTANEOUS
  Administered 2019-06-03: 1 [IU] via SUBCUTANEOUS
  Administered 2019-06-04: 17:00:00 2 [IU] via SUBCUTANEOUS
  Administered 2019-06-04 – 2019-06-05 (×2): 1 [IU] via SUBCUTANEOUS
  Administered 2019-06-05: 12:00:00 2 [IU] via SUBCUTANEOUS
  Administered 2019-06-05 – 2019-06-06 (×2): 1 [IU] via SUBCUTANEOUS
  Administered 2019-06-06: 3 [IU] via SUBCUTANEOUS
  Administered 2019-06-07: 12:00:00 5 [IU] via SUBCUTANEOUS
  Administered 2019-06-08: 2 [IU] via SUBCUTANEOUS
  Administered 2019-06-08 – 2019-06-09 (×2): 1 [IU] via SUBCUTANEOUS
  Administered 2019-06-10: 3 [IU] via SUBCUTANEOUS
  Administered 2019-06-11 – 2019-06-12 (×3): 2 [IU] via SUBCUTANEOUS
  Administered 2019-06-12: 13:00:00 1 [IU] via SUBCUTANEOUS
  Administered 2019-06-13: 3 [IU] via SUBCUTANEOUS
  Administered 2019-06-14 (×2): 2 [IU] via SUBCUTANEOUS
  Administered 2019-06-14: 5 [IU] via SUBCUTANEOUS
  Administered 2019-06-14: 2 [IU] via SUBCUTANEOUS
  Administered 2019-06-15: 3 [IU] via SUBCUTANEOUS
  Administered 2019-06-15 – 2019-06-16 (×2): 2 [IU] via SUBCUTANEOUS
  Administered 2019-06-16: 5 [IU] via SUBCUTANEOUS
  Administered 2019-06-16: 13:00:00 2 [IU] via SUBCUTANEOUS
  Administered 2019-06-17: 5 [IU] via SUBCUTANEOUS
  Administered 2019-06-17: 2 [IU] via SUBCUTANEOUS
  Administered 2019-06-17 – 2019-06-18 (×3): 1 [IU] via SUBCUTANEOUS
  Administered 2019-06-18 – 2019-06-19 (×2): 3 [IU] via SUBCUTANEOUS
  Administered 2019-06-19: 08:00:00 1 [IU] via SUBCUTANEOUS
  Administered 2019-06-20: 12:00:00 5 [IU] via SUBCUTANEOUS
  Administered 2019-06-20: 3 [IU] via SUBCUTANEOUS
  Administered 2019-06-20: 2 [IU] via SUBCUTANEOUS
  Administered 2019-06-21: 3 [IU] via SUBCUTANEOUS
  Administered 2019-06-21: 2 [IU] via SUBCUTANEOUS
  Administered 2019-06-21: 17:00:00 5 [IU] via SUBCUTANEOUS
  Administered 2019-06-22: 3 [IU] via SUBCUTANEOUS
  Administered 2019-06-22: 2 [IU] via SUBCUTANEOUS
  Administered 2019-06-23 (×2): 1 [IU] via SUBCUTANEOUS
  Administered 2019-06-23: 2 [IU] via SUBCUTANEOUS
  Administered 2019-06-24: 1 [IU] via SUBCUTANEOUS
  Administered 2019-06-24: 2 [IU] via SUBCUTANEOUS
  Administered 2019-06-24 – 2019-06-25 (×3): 3 [IU] via SUBCUTANEOUS

## 2019-05-14 MED ORDER — POTASSIUM PHOSPHATES 15 MMOLE/5ML IV SOLN
40.0000 meq | Freq: Once | INTRAVENOUS | Status: DC
  Filled 2019-05-14: qty 9.09

## 2019-05-14 MED ORDER — INSULIN DETEMIR 100 UNIT/ML ~~LOC~~ SOLN
7.0000 [IU] | Freq: Every day | SUBCUTANEOUS | Status: DC
  Administered 2019-05-14: 7 [IU] via SUBCUTANEOUS
  Filled 2019-05-14 (×2): qty 0.07

## 2019-05-14 MED ORDER — ENSURE ENLIVE PO LIQD
237.0000 mL | Freq: Two times a day (BID) | ORAL | Status: DC
  Administered 2019-05-14: 237 mL via ORAL

## 2019-05-14 MED ORDER — PRO-STAT SUGAR FREE PO LIQD
30.0000 mL | Freq: Every day | ORAL | Status: DC
  Administered 2019-05-14 – 2019-05-20 (×6): 30 mL via ORAL
  Filled 2019-05-14 (×7): qty 30

## 2019-05-14 MED ORDER — POTASSIUM CHLORIDE CRYS ER 20 MEQ PO TBCR
40.0000 meq | EXTENDED_RELEASE_TABLET | Freq: Three times a day (TID) | ORAL | Status: DC
  Administered 2019-05-14 – 2019-05-15 (×5): 40 meq via ORAL
  Filled 2019-05-14 (×5): qty 2

## 2019-05-14 MED ORDER — POTASSIUM CHLORIDE CRYS ER 20 MEQ PO TBCR: 20.0000 meq | EXTENDED_RELEASE_TABLET | Freq: Two times a day (BID) | ORAL | Status: DC

## 2019-05-14 MED ORDER — ENSURE ENLIVE PO LIQD
237.0000 mL | Freq: Two times a day (BID) | ORAL | Status: DC
  Administered 2019-05-14 – 2019-05-22 (×9): 237 mL via ORAL

## 2019-05-14 MED ORDER — HYDRALAZINE HCL 20 MG/ML IJ SOLN: 5.0000 mg | INTRAMUSCULAR | Status: DC | PRN

## 2019-05-14 MED ORDER — PNEUMOCOCCAL VAC POLYVALENT 25 MCG/0.5ML IJ INJ
0.5000 mL | INJECTION | INTRAMUSCULAR | Status: AC
  Administered 2019-05-15: 0.5 mL via INTRAMUSCULAR
  Filled 2019-05-14: qty 0.5

## 2019-05-14 MED ORDER — INSULIN ASPART 100 UNIT/ML ~~LOC~~ SOLN
0.0000 [IU] | Freq: Every day | SUBCUTANEOUS | Status: DC
  Administered 2019-05-15: 5 [IU] via SUBCUTANEOUS
  Administered 2019-05-16 – 2019-05-17 (×2): 4 [IU] via SUBCUTANEOUS
  Administered 2019-05-18: 3 [IU] via SUBCUTANEOUS
  Administered 2019-05-19: 4 [IU] via SUBCUTANEOUS
  Administered 2019-05-20: 5 [IU] via SUBCUTANEOUS
  Administered 2019-05-21 – 2019-05-22 (×2): 2 [IU] via SUBCUTANEOUS
  Administered 2019-05-23: 3 [IU] via SUBCUTANEOUS
  Administered 2019-05-28 – 2019-05-29 (×2): 2 [IU] via SUBCUTANEOUS
  Administered 2019-05-30: 3 [IU] via SUBCUTANEOUS
  Administered 2019-06-04: 2 [IU] via SUBCUTANEOUS
  Administered 2019-06-09: 5 [IU] via SUBCUTANEOUS
  Administered 2019-06-11: 22:00:00 3 [IU] via SUBCUTANEOUS
  Administered 2019-06-13: 22:00:00 4 [IU] via SUBCUTANEOUS
  Administered 2019-06-14: 2 [IU] via SUBCUTANEOUS
  Administered 2019-06-15: 3 [IU] via SUBCUTANEOUS
  Administered 2019-06-19: 2 [IU] via SUBCUTANEOUS
  Administered 2019-06-20: 21:00:00 3 [IU] via SUBCUTANEOUS
  Administered 2019-06-23: 23:00:00 2 [IU] via SUBCUTANEOUS
  Administered 2019-06-24: 21:00:00 3 [IU] via SUBCUTANEOUS

## 2019-05-14 MED ORDER — MAGNESIUM OXIDE 400 (241.3 MG) MG PO TABS
400.0000 mg | ORAL_TABLET | Freq: Two times a day (BID) | ORAL | Status: DC
  Administered 2019-05-14 – 2019-06-25 (×85): 400 mg via ORAL
  Filled 2019-05-14 (×85): qty 1

## 2019-05-14 MED ORDER — OXYCODONE-ACETAMINOPHEN 5-325 MG PO TABS
1.0000 | ORAL_TABLET | Freq: Four times a day (QID) | ORAL | Status: DC | PRN
  Administered 2019-05-14 – 2019-06-11 (×11): 1 via ORAL
  Filled 2019-05-14 (×11): qty 1

## 2019-05-14 MED ORDER — POTASSIUM PHOSPHATES 15 MMOLE/5ML IV SOLN
30.0000 mmol | Freq: Once | INTRAVENOUS | Status: AC
  Administered 2019-05-14: 30 mmol via INTRAVENOUS
  Filled 2019-05-14: qty 10

## 2019-05-14 MED ORDER — BOOST / RESOURCE BREEZE PO LIQD CUSTOM
1.0000 | ORAL | Status: DC
  Administered 2019-05-15 – 2019-05-21 (×4): 1 via ORAL

## 2019-05-14 MED ORDER — MUSCLE RUB 10-15 % EX CREA
TOPICAL_CREAM | CUTANEOUS | Status: DC | PRN
  Administered 2019-05-14: 17:00:00 via TOPICAL
  Filled 2019-05-14: qty 85

## 2019-05-14 MED ORDER — INFLUENZA VAC SPLIT QUAD 0.5 ML IM SUSY
0.5000 mL | PREFILLED_SYRINGE | INTRAMUSCULAR | Status: DC
  Filled 2019-05-14: qty 0.5

## 2019-05-14 NOTE — Progress Notes (Signed)
PROGRESS NOTE    Rodney Barber  WUJ:[id number] DOB: [birth year] DOA: 05/13/2019 PCP: Gildardo Pounds, NP    Brief Narrative:  53 year old male with history of insulin-dependent diabetes, hypertension, chronic pancreatitis, smoker, recent prolonged admission in the hospital complicated with initial suicidal ideation later cleared, deconditioning, now homeless presents to the emergency room with extreme weakness and leg cramps, EMS found with blood sugars of 500.  In the emergency room blood pressures elevated.  Potassium less than 2.  EKG with U waves.  Patient admitted to the hospital with hyperglycemia and severe hypokalemia. Patient stated that he is out of insulin for more than a week.  Patient has diarrhea secondary to taking lactulose at home.   Assessment & Plan:   Principal Problem:   Hypokalemia Active Problems:   Uncontrolled type 2 diabetes mellitus (HCC)   UTI (urinary tract infection)   Hyperglycemia   Tobacco use  Severe hypokalemia: Due to ongoing GI loss in the setting of diarrhea from lactulose use.  Potassium was less than 2.  Aggressively replaced and normalizing.  Continue to replace by mouth.  Recheck potassium and magnesium.  Discontinue lactulose.  Acute UTI present on admission: Suspected.  Patient has frequency probably due to hypoglycemia.  Will continue Rocephin today until final cultures.  Hyperglycemia, uncontrolled insulin-dependent diabetes: Last A1c was 17.7.  Patient is not using any insulin at home.  Repeat A1c pending.  Continue maintenance IV fluids, start insulin as potassium is improving now.  Will resume his home doses of insulin and uptitrate as needed.  Hypertension: On amlodipine continue.  Depression: On Lexapro continue.  Smoker: Counseled to quit.  Homelessness: Case management to help with medication and probably transition to shelter once medically stable.  Has severe electrolyte abnormalities needing frequent monitoring and  replacement.  Will need to stay in the hospital.   DVT prophylaxis: Lovenox subcu Code Status: DNR, patient wished as per admission physician. Family Communication: None Disposition Plan: Homeless shelter.  Anticipate in next 24 to 48 hours.   Consultants:   None  Procedures:   None  Antimicrobials:   Rocephin, 05/13/2019----   Subjective: Patient seen and examined.  No overnight events.  No more diarrhea since admission.  Complains of fatigue, weakness and cramps on the legs.  Objective: Vitals:   05/14/19 0240 05/14/19 0247 05/14/19 0458 05/14/19 0800  BP:  (!) 164/88 (!) 163/87 (!) 165/91  Pulse:  (!) 58 63 66  Resp:  20 20 (!) 21  Temp:  98.7 F (37.1 C) 98 F (36.7 C) 98.5 F (36.9 C)  TempSrc:    Oral  SpO2:  100% 100% 100%  Weight: 46.4 kg     Height: 5\' 8"  (1.727 m)       Intake/Output Summary (Last 24 hours) at 05/14/2019 1146 Last data filed at 05/14/2019 0400 Gross per 24 hour  Intake 437.61 ml  Output 600 ml  Net -162.39 ml   Filed Weights   05/14/19 0240  Weight: 46.4 kg    Examination:  General exam: Appears calm and comfortable, chronically sick looking.  Not in any distress.  On room air. Respiratory system: Clear to auscultation. Respiratory effort normal. Cardiovascular system: S1 & S2 heard, RRR. No JVD, murmurs, rubs, gallops or clicks. No pedal edema. Gastrointestinal system: Abdomen is nondistended, soft and nontender. No organomegaly or masses felt. Normal bowel sounds heard. Central nervous system: Alert and oriented. No focal neurological deficits. Extremities: Symmetric 5 x 5 power. Skin: No rashes, lesions or  ulcers Psychiatry: Judgement and insight appear normal. Mood & affect appropriate.     Data Reviewed: I have personally reviewed following labs and imaging studies  CBC: Recent Labs  Lab 05/13/19 2035 05/14/19 0438  WBC 10.8* 11.2*  NEUTROABS 8.2*  --   HGB 11.3* 11.6*  HCT 33.4* 35.6*  MCV 91.0 92.5  PLT 280  288   Basic Metabolic Panel: Recent Labs  Lab 05/13/19 2035 05/14/19 0438 05/14/19 0821  NA 135  --   --   K <2.0* 2.7* 3.0*  CL 96*  --   --   CO2 29  --   --   GLUCOSE 393*  --   --   BUN 9  --   --   CREATININE 0.80  --   --   CALCIUM 8.0*  --   --    GFR: Estimated Creatinine Clearance: 69.3 mL/min (by C-G formula based on SCr of 0.8 mg/dL). Liver Function Tests: No results for input(s): AST, ALT, ALKPHOS, BILITOT, PROT, ALBUMIN in the last 168 hours. No results for input(s): LIPASE, AMYLASE in the last 168 hours. No results for input(s): AMMONIA in the last 168 hours. Coagulation Profile: No results for input(s): INR, PROTIME in the last 168 hours. Cardiac Enzymes: No results for input(s): CKTOTAL, CKMB, CKMBINDEX, TROPONINI in the last 168 hours. BNP (last 3 results) No results for input(s): PROBNP in the last 8760 hours. HbA1C: No results for input(s): HGBA1C in the last 72 hours. CBG: Recent Labs  Lab 05/13/19 1925 05/14/19 0003 05/14/19 0245 05/14/19 0455 05/14/19 0753  GLUCAP 386* 300* 297* 382* 348*   Lipid Profile: No results for input(s): CHOL, HDL, LDLCALC, TRIG, CHOLHDL, LDLDIRECT in the last 72 hours. Thyroid Function Tests: No results for input(s): TSH, T4TOTAL, FREET4, T3FREE, THYROIDAB in the last 72 hours. Anemia Panel: No results for input(s): VITAMINB12, FOLATE, FERRITIN, TIBC, IRON, RETICCTPCT in the last 72 hours. Sepsis Labs: No results for input(s): PROCALCITON, LATICACIDVEN in the last 168 hours.  Recent Results (from the past 240 hour(s))  SARS CORONAVIRUS 2 (TAT 6-24 HRS) Nasopharyngeal Nasopharyngeal Swab     Status: None   Collection Time: 05/13/19 11:37 PM   Specimen: Nasopharyngeal Swab  Result Value Ref Range Status   SARS Coronavirus 2 NEGATIVE NEGATIVE Final    Comment: (NOTE) SARS-CoV-2 target nucleic acids are NOT DETECTED. The SARS-CoV-2 RNA is generally detectable in upper and lower respiratory specimens during the  acute phase of infection. Negative results do not preclude SARS-CoV-2 infection, do not rule out co-infections with other pathogens, and should not be used as the sole basis for treatment or other patient management decisions. Negative results must be combined with clinical observations, patient history, and epidemiological information. The expected result is Negative. Fact Sheet for Patients: HairSlick.no Fact Sheet for Healthcare Providers: quierodirigir.com This test is not yet approved or cleared by the [place] FDA and  has been authorized for detection and/or diagnosis of SARS-CoV-2 by FDA under an Emergency Use Authorization (EUA). This EUA will remain  in effect (meaning this test can be used) for the duration of the COVID-19 declaration under Section 56 4(b)(1) of the Act, 21 U.S.C. section 360bbb-3(b)(1), unless the authorization is terminated or revoked sooner. Performed at [facility], [address], [place], [place] [address]          Radiology Studies: No results found.      Scheduled Meds: . amLODipine  5 mg Oral Daily  . enoxaparin (LOVENOX)  injection  40 mg Subcutaneous QHS  . escitalopram  5 mg Oral Daily  . feeding supplement (ENSURE ENLIVE)  237 mL Oral BID BM  . folic acid  1 mg Oral Daily  . [START ON 05/15/2019] influenza vac split quadrivalent PF  0.5 mL Intramuscular Tomorrow-1000  . insulin aspart  0-5 Units Subcutaneous QHS  . insulin aspart  0-9 Units Subcutaneous TID WC  . insulin detemir  7 Units Subcutaneous Daily  . lipase/protease/amylase  12,000 Units Oral TID WC  . multivitamin with minerals  1 tablet Oral Daily  . nicotine  14 mg Transdermal Daily  . [START ON 05/15/2019] pneumococcal 23 valent vaccine  0.5 mL Intramuscular Tomorrow-1000  . thiamine  100 mg Oral Daily   Continuous Infusions: . cefTRIAXone (ROCEPHIN)  IV       LOS: 0 days    Time spent: 35  minutes    Dorcas Carrow, MD [facility] Pager [phone]  If 7PM-7AM, please contact night-coverage [email or web address] Password [email or web address] 05/14/2019, 11:46 AM

## 2019-05-14 NOTE — Progress Notes (Signed)
Initial Nutrition Assessment  RD working remotely.   DOCUMENTATION CODES:   Underweight  INTERVENTION:  - continue Ensure Enlive BID, each supplement provides 350 kcal and 20 grams of protein. - will order Boost Breeze once/day, each supplement provides 250 kcal and 9 grams of protein. - will order 30 mL Prostat BID, each supplement provides 100 kcal and 15 grams of protein. - continue to encourage PO intakes.  - will monitor for additional nutrition-related needs.    NUTRITION DIAGNOSIS:   Increased nutrient needs related to chronic illness, acute illness as evidenced by estimated needs.  GOAL:   Patient will meet greater than or equal to 90% of their needs  MONITOR:   PO intake, Supplement acceptance, Labs, Weight trends  REASON FOR ASSESSMENT:   Malnutrition Screening Tool  ASSESSMENT:   53-year-old male with history of insulin-dependent DM, HTN, chronic pancreatitis, smoker, and recent prolonged hospitalization complicated by suicidal ideation later cleared, deconditioning, now homeless. He presented to the ED on 10/6 with extreme weakness and leg cramps. EMS found CBG to be >500 mg/dl and in the ED he was hypertensive. He was admitted for hyperglycemia and hypokalemia. Patient reported being out of insulin x1 week PTA and having diarrhea at home 2/2 taking lactulose.  No intakes documented since admission. Ensure Enlive ordered BID and patient has accepted the bottle that was offered to him today. Unable to reach patient by phone. Per chart review, current weight is 102 lb and weight on 02/22/19 was 108 lb. This indicates 6 lb weight loss (5.5% body weight) in the past 3.5 months; not significant for time frame. Weight on 12/04/18 was 130 lb. This indicates 28 lb weight loss (21% body weight) in the past 5-5.5 months; significant for time frame.   Patient was last assessed in person by a RD at [facility] on 7/28 at which time patient met criteria for severe malnutrition in the  context of chronic illness as evidenced by percent weight loss, moderate and severe fat depletion, and moderate and severe muscle depletion. Suspect patient continues to meet these criteria.   Per notes: - uncontrolled type 2 DM with last HgbA1c of 17.7% - UTI - severe hypokalemia d/t ongoing GI losses 2/2 diarrhea from lactulose use - homelessness   Labs reviewed; CBGs: 297, 382, 348, and 127 mg/dl, K: 2.9 mmol/l, creatinine: 0.6 mg/dl, Ca: 7.5 mg/dl, Phos: <1 mg/dl. Medications reviewed; 1 mg folvite/day, sliding scale novolog, 7 units levemir/day, 12000 units creon TID, 400 mg mag-ox BID, 1 g IV Mg sulfate x1 run 10/6, daily multivitamin with minerals, 2 packets phos-nak TID, 40 mEq Klor-Con x1 dose 10/6 and x2 doses 10/7, 40 mEq Klor-Con TID starting 10/7, 30 mmol IV KPhos x1 run 10/7, 100 mg oral thiamine/day.     NUTRITION - FOCUSED PHYSICAL EXAM:  unable to complete at this time.   Diet Order:   Diet Order            Diet Carb Modified Fluid consistency: Thin; Room service appropriate? Yes  Diet effective now              EDUCATION NEEDS:   Not appropriate for education at this time  Skin:  Skin Assessment: Reviewed RN Assessment  Last BM:  10/6  Height:   Ht Readings from Last 1 Encounters:  05/14/19 5' 8" (1.727 m)    Weight:   Wt Readings from Last 1 Encounters:  05/14/19 46.4 kg    Ideal Body Weight:  70 kg  BMI:    Body mass index is 15.57 kg/m.  Estimated Nutritional Needs:   Kcal:  1855-2090 kcal (40-45 kcal/kg)  Protein:  80-90 grams  Fluid:  >/= 2 L/day      Jarome Matin, MS, RD, LDN, [facility] Inpatient Clinical Dietitian Pager # [phone] After hours/weekend pager # [phone]

## 2019-05-14 NOTE — Evaluation (Signed)
Physical Therapy Evaluation Patient Details Name: Rodney Barber MRN: [id number] DOB: 06-09-1966 Today's Date: 05/14/2019   History of Present Illness  53 year old male with history of insulin-dependent diabetes, hypertension, chronic pancreatitis, smoker, recent prolonged admission in the hospital complicated with initial suicidal ideation later cleared, deconditioning, now homeless presents to the emergency room with extreme weakness and leg cramps, EMS found with blood sugars of 500.   Clinical Impression  On eval, pt required Min guard assist for mobility. He walked a short distance around the room while holding on to the IV pole. Limited session 2* moderate neck pain-made RN aware that pt is requesting muscle rub. Discussed d/c plan-pt stated he is unsure of where he will go once discharged. Will follow during hospital stay.     Follow Up Recommendations No PT follow up    Equipment Recommendations  Cane(possibly)    Recommendations for Other Services       Precautions / Restrictions Precautions Precautions: Fall Restrictions Weight Bearing Restrictions: No      Mobility  Bed Mobility Overal bed mobility: Needs Assistance Bed Mobility: Supine to Sit;Sit to Supine     Supine to sit: Supervision Sit to supine: Supervision   General bed mobility comments: for lines  Transfers Overall transfer level: Needs assistance Equipment used: None Transfers: Sit to/from Stand Sit to Stand: Min guard         General transfer comment: for safety.increased time.  Ambulation/Gait Ambulation/Gait assistance: Min guard Gait Distance (Feet): 20 Feet Assistive device: IV Pole Gait Pattern/deviations: Step-through pattern;Decreased stride length     General Gait Details: slow gait speed. unsteady.  Stairs            Wheelchair Mobility    Modified Rankin (Stroke Patients Only)       Balance Overall balance assessment: Needs assistance   Sitting balance-Leahy  Scale: Fair     Standing balance support: Bilateral upper extremity supported;No upper extremity supported Standing balance-Leahy Scale: Fair Standing balance comment: seeks external support                             Pertinent Vitals/Pain Pain Assessment: Faces Faces Pain Scale: Hurts whole lot Pain Location: neck Pain Descriptors / Indicators: Constant;Cramping;Grimacing Pain Intervention(s): Patient requesting pain meds-RN notified    Home Living Family/patient expects to be discharged to:: Unsure                 Additional Comments: homeless/shelter    Prior Function Level of Independence: Independent         Comments: states he was told to use a cane but "(hasn't) gotten around to it."      Hand Dominance   Dominant Hand: Left    Extremity/Trunk Assessment   Upper Extremity Assessment Upper Extremity Assessment: Defer to OT evaluation    Lower Extremity Assessment Lower Extremity Assessment: Generalized weakness    Cervical / Trunk Assessment Cervical / Trunk Assessment: Normal  Communication   Communication: No difficulties  Cognition Arousal/Alertness: Awake/alert Behavior During Therapy: Flat affect Overall Cognitive Status: Within Functional Limits for tasks assessed                                        General Comments      Exercises     Assessment/Plan    PT Assessment Patient needs continued PT services  PT Problem List Decreased balance;Decreased mobility       PT Treatment Interventions DME instruction;Therapeutic activities;Therapeutic exercise;Gait training;Functional mobility training;Balance training;Patient/family education    PT Goals (Current goals can be found in the Care Plan section)  Acute Rehab PT Goals Patient Stated Goal: figure out why my neck is hurting, reduce neck pain. move better PT Goal Formulation: With patient Time For Goal Achievement: 05/28/19 Potential to Achieve  Goals: Good    Frequency Min 3X/week   Barriers to discharge        Co-evaluation               AM-PAC PT "6 Clicks" Mobility  Outcome Measure Help needed turning from your back to your side while in a flat bed without using bedrails?: A Little Help needed moving from lying on your back to sitting on the side of a flat bed without using bedrails?: A Little Help needed moving to and from a bed to a chair (including a wheelchair)?: A Little Help needed standing up from a chair using your arms (e.g., wheelchair or bedside chair)?: A Little Help needed to walk in hospital room?: A Little Help needed climbing 3-5 steps with a railing? : A Little 6 Click Score: 18    End of Session   Activity Tolerance: Patient limited by pain Patient left: in bed;with call bell/phone within reach;with bed alarm set   PT Visit Diagnosis: Muscle weakness (generalized) (M62.81);Unsteadiness on feet (R26.81)    Time: 6578-4696 PT Time Calculation (min) (ACUTE ONLY): 12 min   Charges:   PT Evaluation $PT Eval Moderate Complexity: [place], PT Acute Rehabilitation Services Pager: [phone] Office: [phone]

## 2019-05-14 NOTE — Progress Notes (Addendum)
CRITICAL VALUE ALERT  Critical Value:  PHOSPHORUS < 1.0  Date & Time Notied:  05/14/2019 12:43  Provider Notified: Sloan Leiter  Orders Received/Actions taken:

## 2019-05-14 NOTE — Evaluation (Signed)
Occupational Therapy Evaluation Patient Details Name: Rodney Barber MRN: [id number] DOB: 03-21-1966 Today's Date: 05/14/2019    History of Present Illness 53 year old male with history of insulin-dependent diabetes, hypertension, chronic pancreatitis, smoker, recent prolonged admission in the hospital complicated with initial suicidal ideation later cleared, deconditioning, now homeless presents to the emergency room with extreme weakness and leg cramps, EMS found with blood sugars of 500.    Clinical Impression   Pt admitted with the above diagnoses and presents with below problem list. Pt will benefit from continued acute OT to address the below listed deficits and maximize independence with basic ADLs prior to d/c. PTA pt reports he was suppose to be using a cane. He also reports that in the past few days he has been largely sedentary due to feeling poorly, pain, and weakness. Pt currently setup to min guard with ADLs, utilized a rw this session. Of note, pt reporting 8/10 (FACES) left side neck pain throughout session. Nursing aware. Provided heat packs at end of session.      Follow Up Recommendations  Supervision - Intermittent    Equipment Recommendations  None recommended by OT    Recommendations for Other Services PT consult     Precautions / Restrictions Precautions Precautions: Fall Restrictions Weight Bearing Restrictions: No      Mobility Bed Mobility Overal bed mobility: Needs Assistance Bed Mobility: Supine to Sit;Sit to Supine     Supine to sit: Supervision Sit to supine: Supervision   General bed mobility comments: supervision for safety  Transfers Overall transfer level: Needs assistance Equipment used: Rolling walker (2 wheeled) Transfers: Sit to/from Stand Sit to Stand: Min guard         General transfer comment: to/from EOB. min guard for safety. Pt reaching for rw on initial stand from EOB, left it to the side to position self back to EOB at  end of session.     Balance Overall balance assessment: Needs assistance   Sitting balance-Leahy Scale: Fair     Standing balance support: Bilateral upper extremity supported;No upper extremity supported Standing balance-Leahy Scale: Fair Standing balance comment: seeks external support                           ADL either performed or assessed with clinical judgement   ADL Overall ADL's : Needs assistance/impaired Eating/Feeding: Set up;Sitting   Grooming: Set up;Sitting   Upper Body Bathing: Set up;Sitting   Lower Body Bathing: Min guard;Sit to/from stand   Upper Body Dressing : Set up;Sitting   Lower Body Dressing: Min guard;Sit to/from stand   Toilet Transfer: Min guard;Ambulation;RW   Toileting- Architect and Hygiene: Min guard;Sit to/from stand   Tub/ Shower Transfer: Min guard;Ambulation;Rolling walker   Functional mobility during ADLs: Min guard;Rolling walker General ADL Comments: Pt reaching for rw to stand from EOB. Walked in room, navigating turns and obstacles. Min guard due to a little unsteadiness     Vision         Perception     Praxis      Pertinent Vitals/Pain Pain Assessment: Faces Faces Pain Scale: Hurts whole lot Pain Location: L side of neck Pain Descriptors / Indicators: Constant;Cramping;Grimacing Pain Intervention(s): Monitored during session;Limited activity within patient's tolerance;Repositioned;Heat applied(RN in room for part of session, discussed neck pain)     Hand Dominance Left   Extremity/Trunk Assessment Upper Extremity Assessment Upper Extremity Assessment: Generalized weakness   Lower Extremity Assessment Lower Extremity Assessment: Defer  to PT evaluation       Communication Communication Communication: No difficulties   Cognition Arousal/Alertness: Awake/alert Behavior During Therapy: Flat affect Overall Cognitive Status: Within Functional Limits for tasks assessed                                      General Comments       Exercises     Shoulder Instructions      Home Living Family/patient expects to be discharged to:: Unsure                                 Additional Comments: homeless/shelter      Prior Functioning/Environment Level of Independence: Independent        Comments: states he was told to use a cane but "(hasn't) gotten around to it."         OT Problem List: Decreased strength;Decreased activity tolerance;Impaired balance (sitting and/or standing);Decreased knowledge of use of DME or AE;Decreased knowledge of precautions;Pain      OT Treatment/Interventions: Self-care/ADL training;Therapeutic exercise;Energy conservation;DME and/or AE instruction;Therapeutic activities;Patient/family education;Balance training    OT Goals(Current goals can be found in the care plan section) Acute Rehab OT Goals Patient Stated Goal: figure out why my neck is hurting, reduce neck pain. move better OT Goal Formulation: With patient Time For Goal Achievement: 05/28/19 Potential to Achieve Goals: Good ADL Goals Pt Will Perform Grooming: with modified independence;standing Pt Will Perform Upper Body Bathing: with modified independence;sitting Pt Will Perform Lower Body Bathing: with modified independence;sit to/from stand Pt Will Perform Upper Body Dressing: with modified independence;sitting Pt Will Perform Lower Body Dressing: with modified independence;sit to/from stand Pt Will Transfer to Toilet: with modified independence;ambulating Pt Will Perform Toileting - Clothing Manipulation and hygiene: with modified independence;sit to/from stand  OT Frequency: Min 2X/week   Barriers to D/C:    homeless       Co-evaluation              AM-PAC OT "6 Clicks" Daily Activity     Outcome Measure Help from another person eating meals?: None Help from another person taking care of personal grooming?: None Help from another  person toileting, which includes using toliet, bedpan, or urinal?: A Little Help from another person bathing (including washing, rinsing, drying)?: A Little Help from another person to put on and taking off regular upper body clothing?: None Help from another person to put on and taking off regular lower body clothing?: A Little 6 Click Score: 21   End of Session Equipment Utilized During Treatment: Rolling walker Nurse Communication: Other (comment)(neck pain)  Activity Tolerance: Patient limited by pain Patient left: in bed;with call bell/phone within reach;with bed alarm set  OT Visit Diagnosis: Unsteadiness on feet (R26.81);Muscle weakness (generalized) (M62.81);Pain Pain - Right/Left: Left Pain - part of body: (neck)                Time: 1331-1350 OT Time Calculation (min): 19 min Charges:  OT General Charges $OT Visit: 1 Visit OT Evaluation $OT Eval Low Complexity: [place], OT Acute Rehabilitation Services Pager: [phone] Office: [phone]   Hortencia Pilar 05/14/2019, 2:02 PM

## 2019-05-14 NOTE — TOC Initial Note (Signed)
Transition of Care [facility]) - Initial/Assessment Note    Patient Details  Name: Rodney Barber MRN: [id number] Date of Birth: [birth year]  [facility] CM/SW Contact:    Dessa Phi, RN Phone Number: 05/14/2019, 2:30 PM  Clinical Narrative: Spoke to patient about d/c plans-homeless shelter. Already established w/[facility], will go there to pharmacv @ d/c-states he doesn't have any insulin,glucometer,syringes,lancets-will need script. Provided w/shelter list,buses run for no cost.                  Expected Discharge Plan: Homeless Shelter Barriers to Discharge: Continued Medical Work up   Patient Goals and CMS Choice Patient states their goals for this hospitalization and ongoing recovery are:: go home      Expected Discharge Plan and Services Expected Discharge Plan: Homeless Shelter   Discharge Planning Services: CM Consult                                          Prior Living Arrangements/Services   Lives with:: Self Patient language and need for interpreter reviewed:: Yes        Need for Family Participation in Patient Care: No (Comment) Care giver support system in place?: Yes (comment)   Criminal Activity/Legal Involvement Pertinent to Current Situation/Hospitalization: No - Comment as needed  Activities of Daily Living Home Assistive Devices/Equipment: Eyeglasses, Cane (specify quad or straight), Walker (specify type), CBG Meter ADL Screening (condition at time of admission) Patient's cognitive ability adequate to safely complete daily activities?: Yes Is the patient deaf or have difficulty hearing?: No Does the patient have difficulty seeing, even when wearing glasses/contacts?: No Does the patient have difficulty concentrating, remembering, or making decisions?: No Patient able to express need for assistance with ADLs?: Yes Does the patient have difficulty dressing or bathing?: No Independently performs ADLs?: Yes (appropriate for  developmental age) Does the patient have difficulty walking or climbing stairs?: Yes Weakness of Legs: Both Weakness of Arms/Hands: None  Permission Sought/Granted   Permission granted to share information with : Yes, Verbal Permission Granted              Emotional Assessment Appearance:: Appears stated age Attitude/Demeanor/Rapport: Gracious Affect (typically observed): Accepting Orientation: : Oriented to Self, Oriented to Place, Oriented to  Time, Oriented to Situation Alcohol / Substance Use: Tobacco Use, Illicit Drugs Psych Involvement: No (comment)  Admission diagnosis:  Hypokalemia [E87.6] Hyperglycemia [R73.9] Acute cystitis without hematuria [N30.00] Patient Active Problem List   Diagnosis Date Noted  . UTI (urinary tract infection) 05/14/2019  . Hyperglycemia 05/14/2019  . Tobacco use 05/14/2019  . Abnormal blood electrolyte level 05/14/2019  . Suicide attempt ([place]) 02/26/2019  . Acetaminophen toxicity   . Goals of care, counseling/discussion   . Palliative care by specialist   . Shock liver 02/18/2019  . Hyperosmolar non-ketotic state in patient with type 2 diabetes mellitus ([place]) 02/18/2019  . Alcohol use 02/18/2019  . Substance use disorder 02/18/2019  . AKI (acute kidney injury) ([place])   . Altered behavior   . Altered mental status 12/02/2018  . Abnormal liver function 12/02/2018  . AMS (altered mental status) 12/02/2018  . Cough 12/02/2018  . Uncontrolled type 2 diabetes mellitus ([place]) 03/15/2018  . Urine test positive for microalbuminuria 07/12/2017  . Type 2 diabetes mellitus with other specified complication ([place]) 81/85/6314  . Liver lesion 07/22/2013  . 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  Diabetes ([facility]) 07/18/2013  . Hypokalemia 07/11/2013  . Protein-calorie malnutrition, severe ([facility]) 07/11/2013  . Chronic pancreatitis ([facility]) 07/10/2013  . 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 Diabetes mellitus ([facility]) 07/10/2013  .  Weight loss 07/10/2013  . Diarrhea 07/10/2013  . Liver lesion, right lobe 07/10/2013   PCP:  [name], NP Pharmacy:   [facility] - [place], [place] - [address] [place] [address] Phone: [phone] Fax: [phone]  [facility] [place], [place] - [address] [place] [address] Phone: [phone] Fax: [phone]     Social Determinants of Health (SDOH) Interventions    Readmission Risk Interventions Readmission Risk Prevention Plan 02/26/2019 02/22/2019  Transportation Screening Complete Complete  Medication Review (RN Care Manager) Complete Complete  PCP or Specialist appointment within 3-5 days of discharge Complete Complete  HRI or Home Care Consult Complete Complete  SW Recovery Care/Counseling Consult Complete Complete  Palliative Care Screening Complete Complete  Skilled Nursing Facility - Not Applicable  Some recent data might be hidden

## 2019-05-14 NOTE — Progress Notes (Signed)
Patient has complained of pain in left posterior neck. Area assessed left posterior neck is greater than right. Will update provider.

## 2019-05-14 NOTE — ED Notes (Signed)
ED TO INPATIENT HANDOFF REPORT  Name/Age/Gender Rodney Barber 53 y.o. male  Code Status    Code Status Orders  (From admission, onward)         Start     Ordered   05/13/19 2301  Do not attempt resuscitation (DNR)  Continuous    Question Answer Comment  In the event of cardiac or respiratory ARREST Do not call a "code blue"   In the event of cardiac or respiratory ARREST Do not perform Intubation, CPR, defibrillation or ACLS   In the event of cardiac or respiratory ARREST Use medication by any route, position, wound care, and other measures to relive pain and suffering. May use oxygen, suction and manual treatment of airway obstruction as needed for comfort.      05/13/19 2300        Code Status History    Date Active Date Inactive Code Status Order ID Comments User Context   05/13/2019 2227 05/13/2019 2300 Full Code [id number]  Shela Leff, MD ED   02/18/2019 2307 03/06/2019 2312 DNR [id number]  Lenore Cordia, MD ED   12/03/2018 0232 12/04/2018 1824 Full Code [id number]  Jani Gravel, MD Inpatient   07/10/2013 2055 07/14/2013 1751 Full Code [id number]  Domenic Polite, MD Inpatient   Advance Care Planning Activity      Home/SNF/Other Home  Chief Complaint Weakness; Hyperglycemia  Level of Care/Admitting Diagnosis ED Disposition    ED Disposition Condition Comment   Admit  Hospital Area: [facility] [[id number]]  Level of Care: Telemetry [5]  Admit to tele based on following criteria: Monitor QTC interval  Covid Evaluation: Asymptomatic Screening Protocol (No Symptoms)  Diagnosis: Hypokalemia [941740]  Admitting Physician: Shela Leff [[id number]]  Attending Physician: Shela Leff [[id number]]  PT Class (Do Not Modify): Observation [104]  PT Acc Code (Do Not Modify): Observation [10022]       Medical History Past Medical History:  Diagnosis Date  . Diabetes mellitus   . Hypertension   . Pancreatitis     Allergies No Known  Allergies  IV Location/Drains/Wounds Patient Lines/Drains/Airways Status   Active Line/Drains/Airways    Name:   Placement date:   Placement time:   Site:   Days:   Peripheral IV 05/13/19 Left Antecubital   05/13/19    1918    Antecubital   1          Labs/Imaging Results for orders placed or performed during the hospital encounter of 05/13/19 (from the past 48 hour(s))  CBG monitoring, ED     Status: Abnormal   Collection Time: 05/13/19  7:25 PM  Result Value Ref Range   Glucose-Capillary 386 (H) 70 - 99 mg/dL  Urinalysis, Routine w reflex microscopic     Status: Abnormal   Collection Time: 05/13/19  8:28 PM  Result Value Ref Range   Color, Urine STRAW (A) YELLOW   APPearance HAZY (A) CLEAR   Specific Gravity, Urine 1.024 1.005 - 1.030   pH 6.0 5.0 - 8.0   Glucose, UA >=500 (A) NEGATIVE mg/dL   Hgb urine dipstick SMALL (A) NEGATIVE   Bilirubin Urine NEGATIVE NEGATIVE   Ketones, ur NEGATIVE NEGATIVE mg/dL   Protein, ur 30 (A) NEGATIVE mg/dL   Nitrite POSITIVE (A) NEGATIVE   Leukocytes,Ua LARGE (A) NEGATIVE   RBC / HPF 0-5 0 - 5 RBC/hpf   WBC, UA >50 (H) 0 - 5 WBC/hpf   Bacteria, UA NONE SEEN NONE SEEN    Comment: Performed at [facility], [address], [place], [place] [address]  CBC with Differential     Status: Abnormal   Collection Time: 05/13/19  8:35 PM  Result Value Ref Range   WBC 10.8 (H) 4.0 - 10.5 K/uL   RBC 3.67 (L) 4.22 - 5.81 MIL/uL   Hemoglobin 11.3 (L) 13.0 - 17.0 g/dL   HCT 81.8 (L) 29.9 - 37.1 %   MCV 91.0 80.0 - 100.0 fL   MCH 30.8 26.0 - 34.0 pg   MCHC 33.8 30.0 - 36.0 g/dL   RDW 69.6 78.9 - 38.1 %   Platelets 280 150 - 400 K/uL   nRBC 0.0 0.0 - 0.2 %   Neutrophils Relative % 77 %   Neutro Abs 8.2 (H) 1.7 - 7.7 K/uL   Lymphocytes Relative 18 %   Lymphs Abs 1.9 0.7 - 4.0 K/uL   Monocytes Relative 4 %   Monocytes Absolute 0.5 0.1 - 1.0 K/uL   Eosinophils Relative 0 %   Eosinophils Absolute 0.0 0.0 - 0.5 K/uL   Basophils  Relative 0 %   Basophils Absolute 0.0 0.0 - 0.1 K/uL   Immature Granulocytes 1 %   Abs Immature Granulocytes 0.06 0.00 - 0.07 K/uL    Comment: Performed at [facility], [address], [place], [place] [address]  Basic metabolic panel     Status: Abnormal   Collection Time: 05/13/19  8:35 PM  Result Value Ref Range   Sodium 135 135 - 145 mmol/L   Potassium <2.0 (LL) 3.5 - 5.1 mmol/L    Comment: CRITICAL RESULT CALLED TO, READ BACK BY AND VERIFIED WITH: OXENDINE,J @ 2148 ON 100620 BY POTEAT,S    Chloride 96 (L) 98 - 111 mmol/L   CO2 29 22 - 32 mmol/L   Glucose, Bld 393 (H) 70 - 99 mg/dL   BUN 9 6 - 20 mg/dL   Creatinine, Ser 0.25 0.61 - 1.24 mg/dL   Calcium 8.0 (L) 8.9 - 10.3 mg/dL   GFR calc non Af Amer >60 >60 mL/min   GFR calc Af Amer >60 >60 mL/min   Anion gap 10 5 - 15    Comment: Performed at [facility], [address], [place], [place] [address]  CBG monitoring, ED     Status: Abnormal   Collection Time: 05/14/19 12:03 AM  Result Value Ref Range   Glucose-Capillary 300 (H) 70 - 99 mg/dL   No results found.  Pending Labs Unresulted Labs (From admission, onward)    Start     Ordered   05/14/19 0500  CBC  Tomorrow morning,   R     05/13/19 2227   05/13/19 2226  Magnesium  ONCE - STAT,   STAT     05/13/19 2227   05/13/19 2226  Potassium  Now then every 4 hours,   R (with STAT occurrences)     05/13/19 2227   05/13/19 2151  Urine culture  ONCE - STAT,   STAT     05/13/19 2150   05/13/19 2150  SARS CORONAVIRUS 2 (TAT 6-24 HRS) Nasopharyngeal Nasopharyngeal Swab  (Asymptomatic/Tier 2 Patients Labs)  Once,   STAT    Question Answer Comment  Is this test for diagnosis or screening Screening   Symptomatic for COVID-19 as defined by CDC No   Hospitalized for COVID-19 No   Admitted to ICU for COVID-19 No   Previously tested for COVID-19 No   Resident in a congregate (group) care setting No  Employed in healthcare setting No       05/13/19 2149          Vitals/Pain Today's Vitals   05/13/19 2300 05/14/19 0000 05/14/19 0100 05/14/19 0129  BP: (!) 172/87 (!) 166/82 (!) 163/84 (!) 163/84  Pulse: 63 (!) 55 (!) 56 63  Resp: 14 12 13 19   Temp:    97.9 F (36.6 C)  TempSrc:      SpO2: 100% 100% 100% 100%  PainSc:        Isolation Precautions No active isolations  Medications Medications  potassium chloride 10 mEq in 100 mL IVPB (10 mEq Intravenous Transfusing/Transfer 05/14/19 0132)  cefTRIAXone (ROCEPHIN) 1 g in sodium chloride 0.9 % 100 mL IVPB (has no administration in time range)  potassium chloride SA (KLOR-CON) CR tablet 40 mEq (has no administration in time range)  escitalopram (LEXAPRO) tablet 5 mg (has no administration in time range)  amLODipine (NORVASC) tablet 5 mg (has no administration in time range)  lipase/protease/amylase (CREON) capsule 12,000 Units (has no administration in time range)  folic acid (FOLVITE) tablet 1 mg (has no administration in time range)  multivitamin with minerals tablet 1 tablet (has no administration in time range)  thiamine (VITAMIN B-1) tablet 100 mg (has no administration in time range)  nicotine (NICODERM CQ - dosed in mg/24 hours) patch 14 mg (has no administration in time range)  0.9 %  sodium chloride infusion ( Intravenous Transfusing/Transfer 05/14/19 0132)  enoxaparin (LOVENOX) injection 40 mg (40 mg Subcutaneous Given 05/14/19 0126)  acetaminophen (TYLENOL) tablet 650 mg (has no administration in time range)    Or  acetaminophen (TYLENOL) suppository 650 mg (has no administration in time range)  hydrALAZINE (APRESOLINE) injection 5 mg (has no administration in time range)  sodium chloride 0.9 % bolus 1,000 mL (0 mLs Intravenous Stopped 05/14/19 0015)  potassium chloride SA (KLOR-CON) CR tablet 40 mEq (40 mEq Oral Given 05/14/19 0009)  cefTRIAXone (ROCEPHIN) 2 g in sodium chloride 0.9 % 100 mL IVPB (0 g Intravenous Stopped 05/14/19 0014)  magnesium sulfate IVPB 1  g 100 mL (0 g Intravenous Stopped 05/14/19 0015)    Mobility walks

## 2019-05-15 ENCOUNTER — Inpatient Hospital Stay (HOSPITAL_COMMUNITY): Payer: Self-pay

## 2019-05-15 ENCOUNTER — Encounter (HOSPITAL_COMMUNITY): Payer: Self-pay | Admitting: Radiology

## 2019-05-15 DIAGNOSIS — Z590 Homelessness: Secondary | ICD-10-CM

## 2019-05-15 DIAGNOSIS — B9561 Methicillin susceptible Staphylococcus aureus infection as the cause of diseases classified elsewhere: Secondary | ICD-10-CM

## 2019-05-15 DIAGNOSIS — M4652 Other infective spondylopathies, cervical region: Secondary | ICD-10-CM

## 2019-05-15 DIAGNOSIS — E1169 Type 2 diabetes mellitus with other specified complication: Secondary | ICD-10-CM

## 2019-05-15 DIAGNOSIS — R9431 Abnormal electrocardiogram [ECG] [EKG]: Secondary | ICD-10-CM

## 2019-05-15 DIAGNOSIS — Z720 Tobacco use: Secondary | ICD-10-CM

## 2019-05-15 DIAGNOSIS — K861 Other chronic pancreatitis: Secondary | ICD-10-CM

## 2019-05-15 DIAGNOSIS — G061 Intraspinal abscess and granuloma: Secondary | ICD-10-CM

## 2019-05-15 DIAGNOSIS — M4622 Osteomyelitis of vertebra, cervical region: Secondary | ICD-10-CM

## 2019-05-15 LAB — BASIC METABOLIC PANEL
Anion gap: 9 (ref 5–15)
BUN: 11 mg/dL (ref 6–20)
CO2: 27 mmol/L (ref 22–32)
Calcium: 7.7 mg/dL — ABNORMAL LOW (ref 8.9–10.3)
Chloride: 102 mmol/L (ref 98–111)
Creatinine, Ser: 0.7 mg/dL (ref 0.61–1.24)
GFR calc Af Amer: >60 mL/min (ref >60)
GFR calc non Af Amer: >60 mL/min (ref >60)
Glucose, Bld: 142 mg/dL — ABNORMAL HIGH (ref 70–99)
Potassium: 3.9 mmol/L (ref 3.5–5.1)
Sodium: 138 mmol/L (ref 135–145)

## 2019-05-15 LAB — MAGNESIUM: Magnesium: 1.8 mg/dL (ref 1.7–2.4)

## 2019-05-15 LAB — PHOSPHORUS: Phosphorus: 1.6 mg/dL — ABNORMAL LOW (ref 2.5–4.6)

## 2019-05-15 LAB — ECHOCARDIOGRAM COMPLETE
Height: 68 in
Weight: 1638.4 oz

## 2019-05-15 LAB — GLUCOSE, CAPILLARY
Glucose-Capillary: 150 mg/dL — ABNORMAL HIGH (ref 70–99)
Glucose-Capillary: 162 mg/dL — ABNORMAL HIGH (ref 70–99)
Glucose-Capillary: 370 mg/dL — ABNORMAL HIGH (ref 70–99)
Glucose-Capillary: 422 mg/dL — ABNORMAL HIGH (ref 70–99)
Glucose-Capillary: 98 mg/dL (ref 70–99)

## 2019-05-15 MED ORDER — SODIUM CHLORIDE (PF) 0.9 % IJ SOLN
INTRAMUSCULAR | Status: AC
  Filled 2019-05-15: qty 50

## 2019-05-15 MED ORDER — INSULIN DETEMIR 100 UNIT/ML ~~LOC~~ SOLN
10.0000 [IU] | Freq: Every day | SUBCUTANEOUS | Status: DC
  Administered 2019-05-15: 12:00:00 10 [IU] via SUBCUTANEOUS
  Filled 2019-05-15 (×2): qty 0.1

## 2019-05-15 MED ORDER — VANCOMYCIN HCL 500 MG IV SOLR
500.0000 mg | Freq: Two times a day (BID) | INTRAVENOUS | Status: DC
  Administered 2019-05-16 – 2019-05-17 (×3): 500 mg via INTRAVENOUS
  Filled 2019-05-15 (×4): qty 500

## 2019-05-15 MED ORDER — POTASSIUM PHOSPHATES 15 MMOLE/5ML IV SOLN
30.0000 mmol | Freq: Once | INTRAVENOUS | Status: AC
  Administered 2019-05-15: 30 mmol via INTRAVENOUS
  Filled 2019-05-15: qty 10

## 2019-05-15 MED ORDER — INSULIN DETEMIR 100 UNIT/ML ~~LOC~~ SOLN
10.0000 [IU] | Freq: Two times a day (BID) | SUBCUTANEOUS | Status: DC
  Administered 2019-05-15: 22:00:00 10 [IU] via SUBCUTANEOUS
  Filled 2019-05-15 (×2): qty 0.1

## 2019-05-15 MED ORDER — IOHEXOL 300 MG/ML  SOLN
75.0000 mL | Freq: Once | INTRAMUSCULAR | Status: AC | PRN
  Administered 2019-05-15: 14:00:00 75 mL via INTRAVENOUS

## 2019-05-15 MED ORDER — VANCOMYCIN HCL IN DEXTROSE 1-5 GM/200ML-% IV SOLN
1000.0000 mg | Freq: Once | INTRAVENOUS | Status: AC
  Administered 2019-05-15: 1000 mg via INTRAVENOUS
  Filled 2019-05-15: qty 200

## 2019-05-15 MED FILL — !LEVEMIR 100 UNITS/ML VIAL: 100/ML | 40 days supply | Qty: 10 | Fill #0

## 2019-05-15 NOTE — Progress Notes (Signed)
Blood glucose 422. Provider contacted to assist with management.

## 2019-05-15 NOTE — Progress Notes (Signed)
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  Case discussed with Dr. Vertell Limber, neurosurgery, cervical spine is a stable no surgical indication. Case discussed with interventional radiologist, diagnostic aspiration and cultures.  Scheduled.  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 Blood cultures were drawn today only, results pending Already on vancomycin will continue. 2D echocardiogram pending. Need ID input, will consult. Case discussed with Dr Linus Salmons

## 2019-05-15 NOTE — Progress Notes (Signed)
Pharmacy Antibiotic Note  Rodney Barber is a 53 y.o. male admitted on 05/13/2019 with generalized weakness and hypoglycemia.  Pharmacy has been consulted for vancomycin dosing for SA UTI.  Tmax 99.1, WBC 11.2 (11/7) SCr WNL.   Plan: Vancomycin 1 gm loading dose Vancomycin 500 mg IV Q 12 hrs. Goal AUC 400-550. Expected AUC: 483.6 Css Max 28.5, Css min 14 SCr used: 0.8, TBW for CrCl BCx2 sent Echo ordered CT neck ordered F/u SA sensitivities, renal fxn, WBC, temp  Height: 5\' 8"  (172.7 cm) Weight: 102 lb 6.4 oz (46.4 kg) IBW/kg (Calculated) : 68.4  Temp (24hrs), Avg:98.6 F (37 C), Min:98 F (36.7 C), Max:99.1 F (37.3 C)  Recent Labs  Lab 05/13/19 2035 05/14/19 0438 05/14/19 1151 05/15/19 0438  WBC 10.8* 11.2*  --   --   CREATININE 0.80  --  0.60* 0.70    Estimated Creatinine Clearance: 69.3 mL/min (by C-G formula based on SCr of 0.7 mg/dL).    No Known Allergies  Antimicrobials this admission: 10/6 CTX> 10/8 10/8 Vanc>> Dose adjustments this admission:  Microbiology results: 10/8 BCx2: sent 10/6 Covid neg 10/6 UCx: > 100 K Staph aureus   Thank you for allowing pharmacy to be a part of this patient's care.  Eudelia Bunch, Pharm.D [phone] 05/15/2019 1:45 PM

## 2019-05-15 NOTE — Progress Notes (Signed)
2D echocardiogram complete performed.

## 2019-05-15 NOTE — Progress Notes (Signed)
PROGRESS NOTE    Rodney Barber  VVO:[id number] DOB: [birth year] DOA: 05/13/2019 PCP: Gildardo Pounds, NP    Brief Narrative:  53 year old male with history of insulin-dependent diabetes, hypertension, chronic pancreatitis, smoker, recent prolonged admission in the hospital complicated with initial suicidal ideation later cleared, deconditioning, now homeless presents to the emergency room with extreme weakness and leg cramps, EMS found with blood sugars of 500.  In the emergency room blood pressures elevated.  Potassium less than 2.  EKG with U waves.  Patient admitted to the hospital with hyperglycemia and severe hypokalemia as well UTI like presentation. Patient stated that he is out of insulin for more than a week.  Patient has diarrhea secondary to taking lactulose at home for high ammonia.    Assessment & Plan:   Principal Problem:   Hypokalemia Active Problems:   Uncontrolled type 2 diabetes mellitus (HCC)   UTI (urinary tract infection)   Hyperglycemia   Tobacco use   Abnormal blood electrolyte level  Severe hypokalemia: Due to ongoing GI loss in the setting of diarrhea from lactulose use.  Potassium was less than 2.  Aggressively replaced and normalizing. Continue to replace by mouth.  Recheck levels in the morning to ensure stabilization. Discontinue lactulose.  Hypophosphatemia: Severe.  Less than 1 on presentation.  Aggressively replaced, replaced further.  Recheck levels in the morning.  Acute UTI present on admission: Urine culture growing staph aureus.  Patient did have low-grade temperature on presentation.  He is overall in poor health. however, has neck pain and swelling.  We need to work for any invasive Staphylococcus infection.  Discontinue Rocephin, start vancomycin.  Blood cultures to draw today, he is not toxic looking. Echocardiogram. CT scan of the neck for continuous pain and swelling.   Hyperglycemia, uncontrolled insulin-dependent diabetes: Last A1c was  17.7.  Patient is not using any insulin at home. Continue maintenance IV fluids. Will resume his home doses of insulin and uptitrate as needed.  Hypertension: On amlodipine continue.  Depression: On Lexapro continue.  Smoker: Counseled to quit.  Homelessness: Case management to help with medication and probably transition to shelter once medically stable. Will need prescriptions to be [facility] to [facility].  Currently do not know the exact dosing, will send prescriptions nearing discharge.   DVT prophylaxis: Lovenox subcu Code Status: DNR, patient wished as per admission physician. Family Communication: None Disposition Plan: Homeless shelter.  Anticipate in next 24 to 48 hours.   Consultants:   None  Procedures:   None  Antimicrobials:   Rocephin, 05/13/2019----05/15/2019  Vancomycin, 05/15/2019----   Subjective: Patient seen and examined.  T-max 99.1.  Cramping is better.  Still feels weak. Persistent left neck pain that is mostly chronic.  Objective: Vitals:   05/14/19 1813 05/14/19 2127 05/15/19 0517 05/15/19 0900  BP: (!) 150/88 (!) 151/86 (!) 166/98 (!) 160/90  Pulse: 84 90 85 80  Resp: 18 18    Temp: 98.6 F (37 C) 98.7 F (37.1 C) 99.1 F (37.3 C) 98 F (36.7 C)  TempSrc: Oral Oral Oral Oral  SpO2: 100% 100% 99% 100%  Weight:      Height:        Intake/Output Summary (Last 24 hours) at 05/15/2019 1154 Last data filed at 05/15/2019 0840 Gross per 24 hour  Intake 1197.22 ml  Output 1700 ml  Net -502.78 ml   Filed Weights   05/14/19 0240  Weight: 46.4 kg    Examination:  General exam: Appears calm and comfortable,  chronically sick looking.  Not in any distress.  On room air. Respiratory system: Clear to auscultation. Respiratory effort normal. Cardiovascular system: S1 & S2 heard, RRR. No JVD, murmurs, rubs, gallops or clicks. No pedal edema. Gastrointestinal system: Abdomen is nondistended, soft and nontender. No organomegaly or masses felt.  Normal bowel sounds heard. Central nervous system: Alert and oriented. No focal neurological deficits. Extremities: Symmetric 5 x 5 power. Skin: No rashes, lesions or ulcers Psychiatry: Judgement and insight appear normal. Mood & affect appropriate. Has tender swelling at left of the nape of neck, 2x1 cm in size. asymetrical swelling , no neurological deficit.  No swallowing difficulties.    Data Reviewed: I have personally reviewed following labs and imaging studies  CBC: Recent Labs  Lab 05/13/19 2035 05/14/19 0438  WBC 10.8* 11.2*  NEUTROABS 8.2*  --   HGB 11.3* 11.6*  HCT 33.4* 35.6*  MCV 91.0 92.5  PLT 280 288   Basic Metabolic Panel: Recent Labs  Lab 05/13/19 2035 05/14/19 0438 05/14/19 0821 05/14/19 1151 05/15/19 0438  NA 135  --   --  139 138  K <2.0* 2.7* 3.0* 2.9* 3.9  CL 96*  --   --  106 102  CO2 29  --   --  26 27  GLUCOSE 393*  --   --  113* 142*  BUN 9  --   --  8 11  CREATININE 0.80  --   --  0.60* 0.70  CALCIUM 8.0*  --   --  7.5* 7.7*  MG  --   --   --  1.8 1.8  PHOS  --   --   --  <1.0* 1.6*   GFR: Estimated Creatinine Clearance: 69.3 mL/min (by C-G formula based on SCr of 0.7 mg/dL). Liver Function Tests: Recent Labs  Lab 05/14/19 1151  AST 37  ALT 18  ALKPHOS 218*  BILITOT 0.7  PROT 5.2*  ALBUMIN 2.5*   No results for input(s): LIPASE, AMYLASE in the last 168 hours. No results for input(s): AMMONIA in the last 168 hours. Coagulation Profile: No results for input(s): INR, PROTIME in the last 168 hours. Cardiac Enzymes: No results for input(s): CKTOTAL, CKMB, CKMBINDEX, TROPONINI in the last 168 hours. BNP (last 3 results) No results for input(s): PROBNP in the last 8760 hours. HbA1C: No results for input(s): HGBA1C in the last 72 hours. CBG: Recent Labs  Lab 05/14/19 1201 05/14/19 1650 05/15/19 0031 05/15/19 0734 05/15/19 1136  GLUCAP 127* 223* 370* 162* 422*   Lipid Profile: No results for input(s): CHOL, HDL, LDLCALC,  TRIG, CHOLHDL, LDLDIRECT in the last 72 hours. Thyroid Function Tests: No results for input(s): TSH, T4TOTAL, FREET4, T3FREE, THYROIDAB in the last 72 hours. Anemia Panel: No results for input(s): VITAMINB12, FOLATE, FERRITIN, TIBC, IRON, RETICCTPCT in the last 72 hours. Sepsis Labs: No results for input(s): PROCALCITON, LATICACIDVEN in the last 168 hours.  Recent Results (from the past 240 hour(s))  Urine culture     Status: Abnormal (Preliminary result)   Collection Time: 05/13/19  9:51 PM   Specimen: Urine, Random  Result Value Ref Range Status   Specimen Description   Final    URINE, RANDOM Performed at [facility], [address] [place], [place] [address]    Special Requests   Final    NONE Performed at [facility], [address] [place], [place] [address]    Culture (A)  Final    >=100,000 COLONIES/mL STAPHYLOCOCCUS  AUREUS SUSCEPTIBILITIES TO FOLLOW Performed at [facility], [address], [place], [place] [address]    Report Status PENDING  Incomplete  SARS CORONAVIRUS 2 (TAT 6-24 HRS) Nasopharyngeal Nasopharyngeal Swab     Status: None   Collection Time: 05/13/19 11:37 PM   Specimen: Nasopharyngeal Swab  Result Value Ref Range Status   SARS Coronavirus 2 NEGATIVE NEGATIVE Final    Comment: (NOTE) SARS-CoV-2 target nucleic acids are NOT DETECTED. The SARS-CoV-2 RNA is generally detectable in upper and lower respiratory specimens during the acute phase of infection. Negative results do not preclude SARS-CoV-2 infection, do not rule out co-infections with other pathogens, and should not be used as the sole basis for treatment or other patient management decisions. Negative results must be combined with clinical observations, patient history, and epidemiological information. The expected result is Negative. Fact Sheet for Patients: HairSlick.no Fact Sheet for Healthcare Providers:  quierodirigir.com This test is not yet approved or cleared by the [place] FDA and  has been authorized for detection and/or diagnosis of SARS-CoV-2 by FDA under an Emergency Use Authorization (EUA). This EUA will remain  in effect (meaning this test can be used) for the duration of the COVID-19 declaration under Section 56 4(b)(1) of the Act, 21 U.S.C. section 360bbb-3(b)(1), unless the authorization is terminated or revoked sooner. Performed at [facility], [address], [place], [place] [address]          Radiology Studies: No results found.      Scheduled Meds: . amLODipine  5 mg Oral Daily  . enoxaparin (LOVENOX) injection  40 mg Subcutaneous QHS  . escitalopram  5 mg Oral Daily  . feeding supplement  1 Container Oral Q24H  . feeding supplement (ENSURE ENLIVE)  237 mL Oral BID BM  . feeding supplement (PRO-STAT SUGAR FREE 64)  30 mL Oral Daily  . folic acid  1 mg Oral Daily  . influenza vac split quadrivalent PF  0.5 mL Intramuscular Tomorrow-1000  . insulin aspart  0-5 Units Subcutaneous QHS  . insulin aspart  0-9 Units Subcutaneous TID WC  . insulin detemir  10 Units Subcutaneous Daily  . lipase/protease/amylase  12,000 Units Oral TID WC  . magnesium oxide  400 mg Oral BID  . multivitamin with minerals  1 tablet Oral Daily  . nicotine  14 mg Transdermal Daily  . potassium & sodium phosphates  2 packet Oral TID WC & HS  . potassium chloride  40 mEq Oral TID  . thiamine  100 mg Oral Daily   Continuous Infusions: . potassium PHOSPHATE IVPB (in mmol) 30 mmol (05/15/19 0855)     LOS: 1 day    Time spent: 30 minutes    Dorcas Carrow, MD [facility] Pager [phone]  If 7PM-7AM, please contact night-coverage [email or web address] Password [email or web address] 05/15/2019, 11:54 AM

## 2019-05-15 NOTE — Consult Note (Signed)
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 Sloan Leiter  Principal Problem:   Hypokalemia Active Problems:   Uncontrolled type 2 diabetes mellitus ([place])   UTI (urinary tract infection)   Hyperglycemia   Tobacco use   Abnormal blood electrolyte level   . amLODipine  5 mg Oral Daily  . escitalopram  5 mg Oral Daily  . feeding supplement  1 Container Oral Q24H  . feeding supplement (ENSURE ENLIVE)  237 mL Oral BID BM  . feeding supplement (PRO-STAT SUGAR FREE 64)  30 mL Oral Daily  . folic acid  1 mg Oral Daily  . insulin aspart  0-5 Units Subcutaneous QHS  . insulin aspart  0-9 Units Subcutaneous TID WC  . insulin detemir  10 Units Subcutaneous BID  . lipase/protease/amylase  12,000 Units Oral TID WC  . magnesium oxide  400 mg Oral BID  . multivitamin with minerals  1 tablet Oral Daily  . nicotine  14 mg Transdermal Daily  . potassium & sodium phosphates  2 packet Oral TID WC & HS  . potassium chloride  40 mEq Oral TID  . sodium chloride (PF)      . thiamine  100 mg Oral Daily    Recommendations: Vancomycin Blood cultures Aspiration by IR   Assessment: Some neck pain and Staph aureus in urine which would be consistent with bacterial spread to the blood stream of the Staph aureus from the cervical infection.  I agree with the management of TTE, repeat blood cultures, continue with vancomycin.  Will need to consider TEE.    Antibiotics: Ceftriaxone 2 days Day 1 vancomycin  HPI: Rodney Barber is a 53 y.o. male homeless, DM, chronic pancreatitis who came in on 10/6 with weakness.  No fever, no chills, no urinary complaints.  Some new neck pain over several days.  No other complaints.  He had run out of his insulin.  No IVDU.  He had significant electrolyte imbalances on admission.  No other complaints.   Review of Systems:  Constitutional:  negative for fevers, chills and anorexia Gastrointestinal: negative for nausea Integument/breast: negative for rash All other systems reviewed and are negative    Past Medical History:  Diagnosis Date  . Diabetes mellitus   . Hypertension   . Pancreatitis     Social History   Tobacco Use  . Smoking status: Current Every Day Smoker    Packs/day: 0.50    Years: 30.00    Pack years: 15.00    Types: Cigarettes  . Smokeless tobacco: Never Used  . Tobacco comment: 6 cigarett /day  Substance Use Topics  . Alcohol use: No  . Drug use: Not Currently    Types: "Crack" cocaine, Other-see comments, Cocaine    Comment: last smoked crack 12-16-16    Family History  Problem Relation Age of Onset  . Diabetes Mother   . Diabetes Brother     No Known Allergies  Physical Exam: Constitutional: in no apparent distress  Vitals:   05/15/19 0900 05/15/19 1614  BP: (!) 160/90 (!) 143/88  Pulse: 80 87  Resp:  19  Temp: 98 F (36.7 C) 99 F (37.2 C)  SpO2: 100% 98%   EYES: anicteric Cardiovascular: Cor RRR Respiratory: CTA B; normal respiratory effort GI: Bowel sounds are normal Musculoskeletal: no pedal edema noted Skin: negatives: no rash Neuro: non-focal  Lab Results  Component Value Date   WBC 11.2 (H) 05/14/2019   HGB 11.6 (L) 05/14/2019   HCT 35.6 (L) 05/14/2019   MCV 92.5 05/14/2019   PLT 288 05/14/2019    Lab Results  Component Value Date   CREATININE 0.70 05/15/2019   BUN 11 05/15/2019   NA 138 05/15/2019   K 3.9 05/15/2019   CL 102 05/15/2019   CO2 27 05/15/2019    Lab Results  Component Value Date   ALT 18 05/14/2019   AST 37 05/14/2019   ALKPHOS 218 (H) 05/14/2019     Microbiology: Recent Results (from the past 240 hour(s))  Urine culture     Status: Abnormal (Preliminary result)   Collection Time: 05/13/19  9:51 PM   Specimen: Urine, Random  Result Value Ref Range Status   Specimen Description   Final    URINE, RANDOM Performed at [facility], [address] [place], [place] [address]    Special Requests   Final    NONE Performed at [facility], [address], [place], [place] [address]    Culture (A)  Final    >=100,000 COLONIES/mL STAPHYLOCOCCUS AUREUS SUSCEPTIBILITIES TO FOLLOW Performed at [facility], [address] [place], [place] [address]    Report Status PENDING  Incomplete  SARS CORONAVIRUS 2 (TAT 6-24 HRS) Nasopharyngeal Nasopharyngeal Swab     Status: None   Collection Time: 05/13/19 11:37 PM   Specimen: Nasopharyngeal Swab  Result Value Ref Range Status   SARS Coronavirus 2 NEGATIVE NEGATIVE Final    Comment: (NOTE) SARS-CoV-2 target nucleic acids are NOT DETECTED. The SARS-CoV-2 RNA is generally detectable in upper and lower respiratory specimens during the acute phase of infection. Negative results do not preclude SARS-CoV-2 infection, do not rule out co-infections with other pathogens, and should not be used as the sole basis for treatment or other patient management decisions. Negative results must be combined with clinical observations, patient history, and epidemiological information. The expected result is Negative. Fact Sheet for Patients: HairSlick.no Fact Sheet for Healthcare Providers: quierodirigir.com This test is not yet approved or cleared by the [place] FDA and  has been authorized for detection and/or diagnosis of SARS-CoV-2 by FDA under an Emergency Use Authorization (EUA). This EUA will remain  in effect (meaning this test can be used) for the duration of the COVID-19 declaration under Section 56 4(b)(1) of the Act, 21 U.S.C. section 360bbb-3(b)(1), unless the authorization is terminated or revoked sooner. Performed at [facility], [address] [place], [place] [address]     Gardiner Barefoot, MD [facility]  [email or web address] 05/15/2019, 4:24 PM

## 2019-05-16 ENCOUNTER — Encounter (HOSPITAL_COMMUNITY): Payer: Self-pay | Admitting: Interventional Radiology

## 2019-05-16 ENCOUNTER — Inpatient Hospital Stay (HOSPITAL_COMMUNITY): Payer: Self-pay

## 2019-05-16 DIAGNOSIS — R7881 Bacteremia: Secondary | ICD-10-CM

## 2019-05-16 DIAGNOSIS — I330 Acute and subacute infective endocarditis: Secondary | ICD-10-CM

## 2019-05-16 HISTORY — PX: IR US GUIDE BX ASP/DRAIN: IMG2392

## 2019-05-16 LAB — GLUCOSE, CAPILLARY
Glucose-Capillary: 148 mg/dL — ABNORMAL HIGH (ref 70–99)
Glucose-Capillary: 28 mg/dL — CL (ref 70–99)
Glucose-Capillary: 89 mg/dL (ref 70–99)
Glucose-Capillary: 74 mg/dL (ref 70–99)
Glucose-Capillary: 132 mg/dL — ABNORMAL HIGH (ref 70–99)
Glucose-Capillary: 343 mg/dL — ABNORMAL HIGH (ref 70–99)

## 2019-05-16 LAB — URINE CULTURE: Culture: >=100000 — AB

## 2019-05-16 LAB — MAGNESIUM: Magnesium: 1.9 mg/dL (ref 1.7–2.4)

## 2019-05-16 LAB — CBC WITH DIFFERENTIAL/PLATELET
Abs Immature Granulocytes: 0.08 10*3/uL — ABNORMAL HIGH (ref 0.00–0.07)
Basophils Absolute: 0 10*3/uL (ref 0.0–0.1)
Basophils Relative: 0 %
Eosinophils Absolute: 0.1 10*3/uL (ref 0.0–0.5)
Eosinophils Relative: 1 %
HCT: 37.5 % — ABNORMAL LOW (ref 39.0–52.0)
Hemoglobin: 11.9 g/dL — ABNORMAL LOW (ref 13.0–17.0)
Immature Granulocytes: 1 %
Lymphocytes Relative: 22 %
Lymphs Abs: 2.7 10*3/uL (ref 0.7–4.0)
MCH: 30.2 pg (ref 26.0–34.0)
MCHC: 31.7 g/dL (ref 30.0–36.0)
MCV: 95.2 fL (ref 80.0–100.0)
Monocytes Absolute: 0.7 10*3/uL (ref 0.1–1.0)
Monocytes Relative: 6 %
Neutro Abs: 8.5 10*3/uL — ABNORMAL HIGH (ref 1.7–7.7)
Neutrophils Relative %: 70 %
Platelets: 281 10*3/uL (ref 150–400)
RBC: 3.94 MIL/uL — ABNORMAL LOW (ref 4.22–5.81)
RDW: 13.1 % (ref 11.5–15.5)
WBC: 12.1 10*3/uL — ABNORMAL HIGH (ref 4.0–10.5)
nRBC: 0 % (ref 0.0–0.2)

## 2019-05-16 LAB — BASIC METABOLIC PANEL
Anion gap: 10 (ref 5–15)
BUN: 12 mg/dL (ref 6–20)
CO2: 25 mmol/L (ref 22–32)
Calcium: 8.3 mg/dL — ABNORMAL LOW (ref 8.9–10.3)
Chloride: 102 mmol/L (ref 98–111)
Creatinine, Ser: 0.63 mg/dL (ref 0.61–1.24)
GFR calc Af Amer: >60 mL/min (ref >60)
GFR calc non Af Amer: >60 mL/min (ref >60)
Glucose, Bld: 43 mg/dL — CL (ref 70–99)
Potassium: 5.5 mmol/L — ABNORMAL HIGH (ref 3.5–5.1)
Sodium: 137 mmol/L (ref 135–145)

## 2019-05-16 LAB — PROTIME-INR
INR: 0.8 (ref 0.8–1.2)
Prothrombin Time: 11.4 seconds (ref 11.4–15.2)

## 2019-05-16 LAB — PHOSPHORUS: Phosphorus: 2.3 mg/dL — ABNORMAL LOW (ref 2.5–4.6)

## 2019-05-16 MED ORDER — DEXTROSE-NACL 5-0.45 % IV SOLN
INTRAVENOUS | Status: DC
  Administered 2019-05-16 – 2019-05-17 (×3): via INTRAVENOUS

## 2019-05-16 MED ORDER — LIDOCAINE HCL 1 % IJ SOLN
INTRAMUSCULAR | Status: AC
  Filled 2019-05-16: qty 20

## 2019-05-16 MED ORDER — FENTANYL CITRATE (PF) 100 MCG/2ML IJ SOLN
INTRAMUSCULAR | Status: AC
  Filled 2019-05-16: qty 2

## 2019-05-16 MED ORDER — MIDAZOLAM HCL 2 MG/2ML IJ SOLN
INTRAMUSCULAR | Status: AC | PRN
  Administered 2019-05-16: 1 mg via INTRAVENOUS

## 2019-05-16 MED ORDER — FENTANYL CITRATE (PF) 100 MCG/2ML IJ SOLN
INTRAMUSCULAR | Status: AC | PRN
  Administered 2019-05-16: 50 ug via INTRAVENOUS

## 2019-05-16 MED ORDER — SODIUM PHOSPHATES 45 MMOLE/15ML IV SOLN
10.0000 mmol | Freq: Once | INTRAVENOUS | Status: AC
  Administered 2019-05-16: 10 mmol via INTRAVENOUS
  Filled 2019-05-16: qty 3.33

## 2019-05-16 MED ORDER — DEXTROSE 50 % IV SOLN
25.0000 g | INTRAVENOUS | Status: AC
  Administered 2019-05-16: 06:00:00 25 g via INTRAVENOUS

## 2019-05-16 MED ORDER — MIDAZOLAM HCL 2 MG/2ML IJ SOLN
INTRAMUSCULAR | Status: AC
  Filled 2019-05-16: qty 4

## 2019-05-16 MED ORDER — DEXTROSE 50 % IV SOLN
INTRAVENOUS | Status: AC
  Filled 2019-05-16: qty 50

## 2019-05-16 NOTE — Progress Notes (Signed)
Patient returned from procedure. No bandage to left neck. See flow sheet VS stable. F&RT0

## 2019-05-16 NOTE — Progress Notes (Signed)
Referring Physician(s): [place]  Supervising Physician: Jacqulynn Cadet  Patient Status:  [facility] - In-pt  Chief Complaint: Left posterior neck pain   Subjective: Patient familiar to IR service from visceral arteriogram in 2007.  He is a homeless 53 year old male, smoker with history of diabetes, chronic pancreatitis who was admitted to [facility] on 10/ 6 with weakness and neck pain.  Also noted to have staph UTI.  Blood cultures pending.  COVID-19 negative.  He is currently on IV vancomycin.  CT of the neck performed yesterday revealed:  1. 2.9 x 1.9 x 3.8 cm multiloculated peripherally enhancing collection within the left posterior paraspinal musculature/soft tissues spanning the C1-C4 levels. Findings are most consistent with abscess. 2. Irregular lucency within the left C3-C4 articular pillars and also involving the left C4 lamina. Findings likely reflect septic facet joint/osteomyelitis given the adjacent abscess. 3. Diffuse edema within the neck soft tissues suggestive of anasarca. Superimposed cellulitis may be present on the left. 4. Nonspecific mildly enlarged right level 2 lymph node measuring 13 mm in short axis. 5. The right vertebral artery is intermittently poorly delineated suggestive of multifocal stenoses. 6. Poor dentition with multiple absent and carious teeth, as well as periapical lucency surrounding multiple teeth. 7. Maxillary sinus disease as described.  He is scheduled today for ultrasound-guided aspiration of the left posterior neck fluid collection.    Past Medical History:  Diagnosis Date   Diabetes mellitus    Hypertension    Pancreatitis    Past Surgical History:  Procedure Laterality Date   ERCP  06/27/2011   Procedure: ENDOSCOPIC RETROGRADE CHOLANGIOPANCREATOGRAPHY (ERCP);  Surgeon: Jeryl Columbia, MD;  Location: [facility];  Service: Endoscopy;  Laterality: N/A;   ERCP W/ PLASTIC STENT PLACEMENT  03/2011       Allergies: Patient has no known allergies.  Medications: Prior to Admission medications   Medication Sig Start Date End Date Taking? Authorizing Provider  amLODipine (NORVASC) 5 MG tablet Take 1 tablet (5 mg total) by mouth daily. 03/07/19  Yes Aline August, MD  escitalopram (LEXAPRO) 10 MG tablet Take 0.5 tablets (5 mg total) by mouth daily for 30 days. 12/17/18 05/13/19 Yes Kerin Perna, NP  folic acid (FOLVITE) 1 MG tablet Take 1 tablet (1 mg total) by mouth daily. 03/07/19  Yes Aline August, MD  insulin detemir (LEVEMIR) 100 UNIT/ML injection Inject 0.07 mLs (7 Units total) into the skin at bedtime. 03/06/19  Yes Aline August, MD  lactulose (CHRONULAC) 10 GM/15ML solution Take 30 mLs (20 g total) by mouth 2 (two) times daily. 03/06/19  Yes Aline August, MD  lipase/protease/amylase (CREON) 12000 units CPEP capsule Take 1 capsule (12,000 Units total) by mouth 3 (three) times daily with meals. 12/17/18  Yes Kerin Perna, NP  Multiple Vitamin (MULTIVITAMIN WITH MINERALS) TABS tablet Take 1 tablet by mouth daily. 03/07/19  Yes Aline August, MD  thiamine 100 MG tablet Take 1 tablet (100 mg total) by mouth daily. 03/07/19  Yes Aline August, MD  Blood Glucose Monitoring Suppl (TRUERESULT BLOOD GLUCOSE) w/Device KIT uad 04/23/17   [name] R, FNP  glucose blood (TRUE METRIX BLOOD GLUCOSE TEST) test strip Use as instructed 12/17/18   Kerin Perna, NP  TRUEPLUS LANCETS 28G MISC uad 04/23/17   Alfonse Spruce, FNP     Vital Signs: BP (!) 147/85 (BP Location: Left Arm)    Pulse 92    Temp 99.1 F (37.3 C) (Oral)    Resp 16  Ht _0  (1.727 m)    Wt 102 lb 6.4 oz (46.4 kg)    SpO2 97%    BMI 15.57 kg/m   Physical Exam awake, alert.  Chest with distant breath sounds bilaterally.  Heart with regular rate and rhythm.  Abdomen soft, positive bowel sounds, nontender.  No lower extremity edema.  Some fullness/fluctuance/tenderness to the left posterior upper  neck/base of head region  Imaging: Ct Soft Tissue Neck W Contrast  Result Date: 05/15/2019 CLINICAL DATA:  Back pain, minor trauma left posterior neck pain and soft tissue swelling, suspect hematoma, muscle strain. EXAM: CT NECK WITH CONTRAST TECHNIQUE: Multidetector CT imaging of the neck was performed using the standard protocol following the bolus administration of intravenous contrast. CONTRAST:  43m OMNIPAQUE IOHEXOL 300 MG/ML  SOLN COMPARISON:  Noncontrast head CT 02/21/2019 FINDINGS: Pharynx and larynx: There is nonspecific diffuse edema within the neck soft tissues. There is poor dentition with multiple absent and carious teeth, as well as periapical lucency surrounding multiple teeth. No discrete mass is demonstrated within the nasopharynx, oral cavity, oropharynx, hypopharynx or larynx. No significant airway compromise at the imaged levels. Salivary glands: The bilateral parotid and submandibular glands are surrounded by nonspecific edema, but intrinsically unremarkable. Thyroid: Unremarkable Lymph nodes: Nonspecific mildly enlarged right level 2 lymph node measuring 12 mm in short axis (series 8, image 32). No other pathologically enlarged cervical chain lymph nodes identified. Vascular: The cervical right vertebral artery is at times poorly delineated suggestive of multifocal stenoses. The major vascular structures of the neck appear otherwise patent. Calcified plaque at the carotid bifurcations bilaterally. Limited intracranial: Atherosclerotic calcification of the carotid artery siphons. Otherwise unremarkable. Visualized orbits: No acute abnormality Mastoids and visualized paranasal sinuses: Mild mucosal thickening within the inferior maxillary sinuses bilaterally. There is a nonspecific calcified focus within the inferior left maxillary sinus, unchanged from prior head CT. No significant mastoid effusion at the imaged levels. Skeleton: Moderate/severe cervical spondylosis with multilevel  posterior disc osteophytes, uncovertebral and facet hypertrophy. This includes moderate/severe disc height loss at the C5-C6 and C6-C7 levels. There is irregular lucency within the left C3 and C4 articular pillars and extending into the left C4 lamina (series 5, images 38-41) (series 8, image 62). Within the adjacent left paraspinal musculature/soft tissues, along the left posterolateral aspect of the cervical spine, there is a peripherally enhancing multiloculated collection which spans the C1-C4 levels, measures up to 2.9 x 1.9 cm in transaxial dimensions and 3.8 cm in craniocaudal dimension (series 3, image 36) (series 8, image 70). Upper chest: No consolidation within the imaged lung apices. Partially imaged small bilateral pleural effusions (greater on the left). These results will be called to the ordering clinician or representative by the Radiologist Assistant, and communication documented in the PACS or zVision Dashboard. IMPRESSION: 1. 2.9 x 1.9 x 3.8 cm multiloculated peripherally enhancing collection within the left posterior paraspinal musculature/soft tissues spanning the C1-C4 levels. Findings are most consistent with abscess. 2. Irregular lucency within the left C3-C4 articular pillars and also involving the left C4 lamina. Findings likely reflect septic facet joint/osteomyelitis given the adjacent abscess. 3. Diffuse edema within the neck soft tissues suggestive of anasarca. Superimposed cellulitis may be present on the left. 4. Nonspecific mildly enlarged right level 2 lymph node measuring 13 mm in short axis. 5. The right vertebral artery is intermittently poorly delineated suggestive of multifocal stenoses. 6. Poor dentition with multiple absent and carious teeth, as well as periapical lucency surrounding multiple teeth. 7. Maxillary sinus  disease as described. Electronically Signed   By: Kellie Simmering   On: 05/15/2019 15:11    Labs:  CBC: Recent Labs    03/06/19 0343 05/13/19 2035  05/14/19 0438 05/16/19 0432  WBC 6.2 10.8* 11.2* 12.1*  HGB 8.1* 11.3* 11.6* 11.9*  HCT 24.6* 33.4* 35.6* 37.5*  PLT 148* 280 288 281    COAGS: Recent Labs    02/28/19 0350 03/01/19 0338 03/06/19 0343 05/16/19 0432  INR 1.1 1.1 1.0 0.8    BMP: Recent Labs    05/13/19 2035  05/14/19 0821 05/14/19 1151 05/15/19 0438 05/16/19 0432  NA 135  --   --  139 138 137  K <2.0*   < > 3.0* 2.9* 3.9 5.5*  CL 96*  --   --  106 102 102  CO2 29  --   --  _0 GLUCOSE 393*  --   --  113* 142* 43*  BUN 9  --   --  _1 CALCIUM 8.0*  --   --  7.5* 7.7* 8.3*  CREATININE 0.80  --   --  0.60* 0.70 0.63  GFRNONAA >60  --   --  >60 >60 >60  GFRAA >60  --   --  >60 >60 >60   < > = values in this interval not displayed.    LIVER FUNCTION TESTS: Recent Labs    02/28/19 0350 03/01/19 0338 03/06/19 0343 05/14/19 1151  BILITOT 1.4* 1.1 1.1 0.7  AST 48* 45* 28 37  ALT 167* 139* 54* 18  ALKPHOS 382* 367* 205* 218*  PROT 4.7* 5.2* 4.8* 5.2*  ALBUMIN 1.8* 1.9* 2.0* 2.5*    Assessment and Plan: Homeless 53 year old male smoker with history of diabetes, chronic pancreatitis who was admitted to [facility] on 10 /6 with weakness and neck pain.  Also noted to have staph UTI.  Blood cultures pending.  COVID-19 negative.  He is currently on IV vancomycin.  CT of the neck performed yesterday revealed:  1. 2.9 x 1.9 x 3.8 cm multiloculated peripherally enhancing collection within the left posterior paraspinal musculature/soft tissues spanning the C1-C4 levels. Findings are most consistent with abscess. 2. Irregular lucency within the left C3-C4 articular pillars and also involving the left C4 lamina. Findings likely reflect septic facet joint/osteomyelitis given the adjacent abscess. 3. Diffuse edema within the neck soft tissues suggestive of anasarca. Superimposed cellulitis may be present on the left. 4. Nonspecific mildly enlarged right level 2 lymph node measuring 13 mm  in short axis. 5. The right vertebral artery is intermittently poorly delineated suggestive of multifocal stenoses. 6. Poor dentition with multiple absent and carious teeth, as well as periapical lucency surrounding multiple teeth. 7. Maxillary sinus disease as described.  He is scheduled today for ultrasound-guided aspiration of the left posterior neck fluid collection.  WBC 12.1, temp 99.1.  Imaging studies have been reviewed by Dr. Laurence Ferrari.Risks and benefits of procedure was discussed with the patient  including, but not limited to bleeding, infection, damage to adjacent structures or low yield requiring additional tests.  All of the questions were answered and there is agreement to proceed.  Consent signed and in chart.  Procedure scheduled for this afternoon.     Electronically Signed: D. Rowe Robert, PA-C 05/16/2019, 10:48 AM   I spent a total of 25 minutes at the the patient's bedside AND on the patient's hospital floor or unit, greater than 50% of which was counseling/coordinating care for image  guided aspiration of left posterior neck fluid collection    Patient ID: Chris Cripps, male   DOB: Oct 14, 1965, 53 y.o.   MRN: [id number]

## 2019-05-16 NOTE — Procedures (Signed)
Interventional Radiology Procedure Note  Procedure: US guided aspiration of left posterior neck fluid collection yielding 2 mL purulent fluid.   Complications: None  Estimated Blood Loss: None  Recommendations: - Sent for gram stain and culture  Signed,  Criselda Peaches, MD

## 2019-05-16 NOTE — Progress Notes (Signed)
OT Cancellation Note  Patient Details Name: Rodney Barber MRN: [id number] DOB: 02-17-1966   Cancelled Treatment:    Reason Eval/Treat Not Completed: Other (comment) attempted skilled OT treatment session.  Pt. Politely declined stating he wanted to wait until after his procedure that he was having later today.  Also remains upset that he is NPO and is hungry.  Reviewed the rationale and reason why.  He would verbalize understanding but continue to mention how "he just can not believe they didn't even give me breakfast".  Provided reassurance that he would get to eat again soon.  Will attempt back as able.    Janice Coffin, COTA/L  05/16/2019, 12:39 PM

## 2019-05-16 NOTE — Progress Notes (Signed)
Physical Therapy Treatment Patient Details Name: Rodney Barber MRN: [id number] DOB: [birth year] Today's Date: 05/16/2019    History of Present Illness 53 year old male with history of insulin-dependent diabetes, hypertension, chronic pancreatitis, smoker, recent prolonged admission in the hospital complicated with initial suicidal ideation later cleared, deconditioning, now homeless presents to the emergency room with extreme weakness and leg cramps, EMS found with blood sugars of 500.     PT Comments    Progressing with mobility. Pt is a bit upset about not having breakfast this am. Apparently, he is awaiting a procedure and he has been informed of this by RN already. Will continue to follow and progress activity as able.   Follow Up Recommendations  No PT follow up     Equipment Recommendations  Cane(possibly-continuing to assess)    Recommendations for Other Services       Precautions / Restrictions Precautions Precautions: Fall Restrictions Weight Bearing Restrictions: No    Mobility  Bed Mobility Overal bed mobility: Modified Independent                Transfers Overall transfer level: Needs assistance Equipment used: None   Sit to Stand: Min guard         General transfer comment: for safety  Ambulation/Gait Ambulation/Gait assistance: Min guard Gait Distance (Feet): 50 Feet Assistive device: IV Pole;None Gait Pattern/deviations: Step-through pattern;Decreased stride length     General Gait Details: walked ~25 feet without UE support and ~25 feet while holding on to IV pole. mildly unsteady at times.   Stairs             Wheelchair Mobility    Modified Rankin (Stroke Patients Only)       Balance Overall balance assessment: Mild deficits observed, not formally tested                                          Cognition Arousal/Alertness: Awake/alert Behavior During Therapy: Flat affect Overall Cognitive Status:  Within Functional Limits for tasks assessed                                        Exercises      General Comments        Pertinent Vitals/Pain Pain Assessment: Faces Faces Pain Scale: Hurts little more Pain Location: neck Pain Descriptors / Indicators: Aching;Sore;Tender Pain Intervention(s): Monitored during session    Home Living                      Prior Function            PT Goals (current goals can now be found in the care plan section) Progress towards PT goals: Progressing toward goals    Frequency    Min 3X/week      PT Plan Current plan remains appropriate    Co-evaluation              AM-PAC PT "6 Clicks" Mobility   Outcome Measure  Help needed turning from your back to your side while in a flat bed without using bedrails?: None Help needed moving from lying on your back to sitting on the side of a flat bed without using bedrails?: None Help needed moving to and from a bed to a chair (including a wheelchair)?: A  Little Help needed standing up from a chair using your arms (e.g., wheelchair or bedside chair)?: A Little Help needed to walk in hospital room?: A Little Help needed climbing 3-5 steps with a railing? : A Little 6 Click Score: 20    End of Session   Activity Tolerance: Patient tolerated treatment well Patient left: in bed;with call bell/phone within reach;with bed alarm set   PT Visit Diagnosis: Muscle weakness (generalized) (M62.81);Unsteadiness on feet (R26.81)     Time: 2426-8341 PT Time Calculation (min) (ACUTE ONLY): 8 min  Charges:  $Gait Training: 8-22 mins                        [name], PT Acute Rehabilitation Services Pager: [phone] Office: [phone]

## 2019-05-16 NOTE — Progress Notes (Signed)
PROGRESS NOTE    Rodney Barber  ZSW:[id number] DOB: 1965-08-24 DOA: 05/13/2019 PCP: [name], NP    Brief Narrative:  53 year old male with history of insulin-dependent diabetes, hypertension, chronic pancreatitis, smoker, recent prolonged admission in the hospital complicated with initial suicidal ideation later cleared, deconditioning, now homeless presents to the emergency room with extreme weakness and leg cramps, EMS found with blood sugars of 500.  In the emergency room blood pressures elevated.  Potassium less than 2.  EKG with U waves.  Patient admitted to the hospital with hyperglycemia and severe hypokalemia as well UTI like presentation. Patient stated that he is out of insulin for more than a week.  Patient has diarrhea secondary to taking lactulose at home for high ammonia.  05/16/2019: CT neck with contrast done on 05/15/2019 revealed 2.9 x 1.9 x 3.8 cm multiloculated abscess involving C1-C4 levels, findings suggestive of likely septic facet joint/osteomyelitis of left C3-C4 with poor dentition.  TTE revealed a very small filamentous mobile density on the ventricular side of the right coronary cusp.  Infectious disease team is following patient.  Blood cultures growing MSSA.   Assessment & Plan:   Principal Problem:   Hypokalemia Active Problems:   Uncontrolled type 2 diabetes mellitus (HCC)   UTI (urinary tract infection)   Hyperglycemia   Tobacco use   Abnormal blood electrolyte level  Neck pain/C1-C4 left posterior paraspinal abscess/C3-C4 Septic facet joint/osteomyelitis/MSSA bacteremia and possible infective endocarditis: -Patient is currently on IV vancomycin. -Blood cultures growing MSSA. -Will defer management of antibiotics to the infectious disease team. -Likely TEE (if okay with infectious disease team) -Patient has also undergone successful ultrasound-guided aspiration of small fluid collection in the left posterior neck. -Further management will  depend on hospital course.  Severe hypokalemia: Due to ongoing GI loss in the setting of diarrhea from lactulose use.  Potassium was less than 2.  Aggressively replaced and normalizing. Continue to replace by mouth.  Recheck levels in the morning to ensure stabilization. Discontinue lactulose. 05/16/2019: Hypokalemia has resolved.  Potassium today is 5.5.  Hypophosphatemia: Severe.  Less than 1 on presentation.  Aggressively replaced, replaced further.  Recheck levels in the morning. 05/16/2019: Phosphorus today is 2.3.  Continue to monitor and replete.  Hopefully, there is no component of refeeding syndrome.  Hyperglycemia, uncontrolled insulin-dependent diabetes: Last A1c was 17.7.  Patient is not using any insulin at home. Continue maintenance IV fluids. Will resume his home doses of insulin and uptitrate as needed. 05/16/2019: Blood sugar is better controlled.  Hypertension: On amlodipine continue.  Depression: On Lexapro continue.  Smoker: Counseled to quit.  Homelessness: Case management to help with medication and probably transition to shelter once medically stable. Will [facility]  Currently do not know the exact dosing, will send prescriptions nearing discharge.   DVT prophylaxis: Lovenox subcu Code Status: DNR, patient wished as per admission physician. Family Communication: None Disposition Plan: Further management depend on hospital course.   Consultants:   None  Procedures:   None  Antimicrobials:   Rocephin, 05/13/2019----05/15/2019  Vancomycin, 05/15/2019----   Subjective: Neck pain is improving. No fever or chills.  Objective: Vitals:   05/15/19 1614 05/15/19 2124 05/16/19 0501 05/16/19 1259  BP: (!) 143/88 (!) 158/96 (!) 147/85 (!) 154/92  Pulse: 87 93 92 84  Resp: 19 16 16 16   Temp: 99 F (37.2 C) 99.7 F (37.6 C) 99.1 F (37.3 C) 98.8 F (37.1 C)  TempSrc: Oral Oral Oral  Oral  SpO2: 98% 100% 97% 98%   Weight:      Height:        Intake/Output Summary (Last 24 hours) at 05/16/2019 1506 Last data filed at 05/16/2019 1400 Gross per 24 hour  Intake 874.98 ml  Output 1075 ml  Net -200.02 ml   Filed Weights   05/14/19 0240  Weight: 46.4 kg    Examination:  General exam: Appears calm and comfortable.  Patient is cachectic.   Respiratory system: Decreased air entry globally. Cardiovascular system: S1 & S2 heard Gastrointestinal system: Abdomen is nondistended, soft and nontender. No organomegaly or masses felt. Normal bowel sounds heard. Central nervous system: Alert and oriented. No focal neurological deficits. Extremities: Symmetric 5 x 5 power. Skin: No rashes, lesions or ulcers Psychiatry: Judgement and insight appear normal. Mood & affect appropriate. Has tender swelling at left of the nape of neck, 2x1 cm in size. asymetrical swelling , no neurological deficit.  No swallowing difficulties.    Data Reviewed: I have personally reviewed following labs and imaging studies  CBC: Recent Labs  Lab 05/13/19 2035 05/14/19 0438 05/16/19 0432  WBC 10.8* 11.2* 12.1*  NEUTROABS 8.2*  --  8.5*  HGB 11.3* 11.6* 11.9*  HCT 33.4* 35.6* 37.5*  MCV 91.0 92.5 95.2  PLT 280 288 281   Basic Metabolic Panel: Recent Labs  Lab 05/13/19 2035 05/14/19 0438 05/14/19 0821 05/14/19 1151 05/15/19 0438 05/16/19 0432  NA 135  --   --  139 138 137  K <2.0* 2.7* 3.0* 2.9* 3.9 5.5*  CL 96*  --   --  106 102 102  CO2 29  --   --  26 27 25   GLUCOSE 393*  --   --  113* 142* 43*  BUN 9  --   --  8 11 12   CREATININE 0.80  --   --  0.60* 0.70 0.63  CALCIUM 8.0*  --   --  7.5* 7.7* 8.3*  MG  --   --   --  1.8 1.8 1.9  PHOS  --   --   --  <1.0* 1.6* 2.3*   GFR: Estimated Creatinine Clearance: 69.3 mL/min (by C-G formula based on SCr of 0.63 mg/dL). Liver Function Tests: Recent Labs  Lab 05/14/19 1151  AST 37  ALT 18  ALKPHOS 218*  BILITOT 0.7  PROT 5.2*  ALBUMIN 2.5*   No results  for input(s): LIPASE, AMYLASE in the last 168 hours. No results for input(s): AMMONIA in the last 168 hours. Coagulation Profile: Recent Labs  Lab 05/16/19 0432  INR 0.8   Cardiac Enzymes: No results for input(s): CKTOTAL, CKMB, CKMBINDEX, TROPONINI in the last 168 hours. BNP (last 3 results) No results for input(s): PROBNP in the last 8760 hours. HbA1C: No results for input(s): HGBA1C in the last 72 hours. CBG: Recent Labs  Lab 05/15/19 2125 05/16/19 0554 05/16/19 0620 05/16/19 0711 05/16/19 1117  GLUCAP 98 28* 148* 89 74   Lipid Profile: No results for input(s): CHOL, HDL, LDLCALC, TRIG, CHOLHDL, LDLDIRECT in the last 72 hours. Thyroid Function Tests: No results for input(s): TSH, T4TOTAL, FREET4, T3FREE, THYROIDAB in the last 72 hours. Anemia Panel: No results for input(s): VITAMINB12, FOLATE, FERRITIN, TIBC, IRON, RETICCTPCT in the last 72 hours. Sepsis Labs: No results for input(s): PROCALCITON, LATICACIDVEN in the last 168 hours.  Recent Results (from the past 240 hour(s))  Urine culture     Status: Abnormal   Collection Time: 05/13/19  9:51 PM  Specimen: Urine, Random  Result Value Ref Range Status   Specimen Description   Final    URINE, RANDOM Performed at [facility], [address], [place], [place] [address]    Special Requests   Final    NONE Performed at [facility], [address] [place], [place] [address]    Culture >=100,000 COLONIES/mL STAPHYLOCOCCUS AUREUS (A)  Final   Report Status 05/16/2019 FINAL  Final   Organism ID, Bacteria STAPHYLOCOCCUS AUREUS (A)  Final      Susceptibility   Staphylococcus aureus - MIC*    CIPROFLOXACIN <=0.5 SENSITIVE Sensitive     GENTAMICIN <=0.5 SENSITIVE Sensitive     NITROFURANTOIN <=16 SENSITIVE Sensitive     OXACILLIN 0.5 SENSITIVE Sensitive     TETRACYCLINE <=1 SENSITIVE Sensitive     VANCOMYCIN 1 SENSITIVE Sensitive     TRIMETH/SULFA <=10 SENSITIVE Sensitive      CLINDAMYCIN <=0.25 SENSITIVE Sensitive     RIFAMPIN <=0.5 SENSITIVE Sensitive     Inducible Clindamycin NEGATIVE Sensitive     * >=100,000 COLONIES/mL STAPHYLOCOCCUS AUREUS  SARS CORONAVIRUS 2 (TAT 6-24 HRS) Nasopharyngeal Nasopharyngeal Swab     Status: None   Collection Time: 05/13/19 11:37 PM   Specimen: Nasopharyngeal Swab  Result Value Ref Range Status   SARS Coronavirus 2 NEGATIVE NEGATIVE Final    Comment: (NOTE) SARS-CoV-2 target nucleic acids are NOT DETECTED. The SARS-CoV-2 RNA is generally detectable in upper and lower respiratory specimens during the acute phase of infection. Negative results do not preclude SARS-CoV-2 infection, do not rule out co-infections with other pathogens, and should not be used as the sole basis for treatment or other patient management decisions. Negative results must be combined with clinical observations, patient history, and epidemiological information. The expected result is Negative. Fact Sheet for Patients: HairSlick.no Fact Sheet for Healthcare Providers: quierodirigir.com This test is not yet approved or cleared by the [place] FDA and  has been authorized for detection and/or diagnosis of SARS-CoV-2 by FDA under an Emergency Use Authorization (EUA). This EUA will remain  in effect (meaning this test can be used) for the duration of the COVID-19 declaration under Section 56 4(b)(1) of the Act, 21 U.S.C. section 360bbb-3(b)(1), unless the authorization is terminated or revoked sooner. Performed at [facility], [address] [place], [place] [address]   Culture, blood (routine x 2)     Status: None (Preliminary result)   Collection Time: 05/15/19 12:19 PM   Specimen: BLOOD LEFT HAND  Result Value Ref Range Status   Specimen Description   Final    BLOOD LEFT HAND Performed at [facility], [address], [place], [place] [address]    Special  Requests   Final    BOTTLES DRAWN AEROBIC AND ANAEROBIC Blood Culture adequate volume Performed at [facility], [address] [place], [place] [address]    Culture   Final    NO GROWTH < 24 HOURS Performed at [facility], [address], [place], [place] [address]    Report Status PENDING  Incomplete  Culture, blood (routine x 2)     Status: None (Preliminary result)   Collection Time: 05/15/19 12:25 PM   Specimen: BLOOD RIGHT ARM  Result Value Ref Range Status   Specimen Description   Final    BLOOD RIGHT ARM Performed at [facility], [address], [place], [place] [address]    Special Requests  Final    BOTTLES DRAWN AEROBIC ONLY Blood Culture adequate volume Performed at [facility], [address], [place], [place] [address]    Culture   Final    NO GROWTH < 24 HOURS Performed at [facility], [address] [place], [place] [address]    Report Status PENDING  Incomplete         Radiology Studies: Ct Soft Tissue Neck W Contrast  Result Date: 05/15/2019 CLINICAL DATA:  Back pain, minor trauma left posterior neck pain and soft tissue swelling, suspect hematoma, muscle strain. EXAM: CT NECK WITH CONTRAST TECHNIQUE: Multidetector CT imaging of the neck was performed using the standard protocol following the bolus administration of intravenous contrast. CONTRAST:  75mL OMNIPAQUE IOHEXOL 300 MG/ML  SOLN COMPARISON:  Noncontrast head CT 02/21/2019 FINDINGS: Pharynx and larynx: There is nonspecific diffuse edema within the neck soft tissues. There is poor dentition with multiple absent and carious teeth, as well as periapical lucency surrounding multiple teeth. No discrete mass is demonstrated within the nasopharynx, oral cavity, oropharynx, hypopharynx or larynx. No significant airway compromise at the imaged levels. Salivary glands: The bilateral parotid and submandibular glands are surrounded by  nonspecific edema, but intrinsically unremarkable. Thyroid: Unremarkable Lymph nodes: Nonspecific mildly enlarged right level 2 lymph node measuring 12 mm in short axis (series 8, image 32). No other pathologically enlarged cervical chain lymph nodes identified. Vascular: The cervical right vertebral artery is at times poorly delineated suggestive of multifocal stenoses. The major vascular structures of the neck appear otherwise patent. Calcified plaque at the carotid bifurcations bilaterally. Limited intracranial: Atherosclerotic calcification of the carotid artery siphons. Otherwise unremarkable. Visualized orbits: No acute abnormality Mastoids and visualized paranasal sinuses: Mild mucosal thickening within the inferior maxillary sinuses bilaterally. There is a nonspecific calcified focus within the inferior left maxillary sinus, unchanged from prior head CT. No significant mastoid effusion at the imaged levels. Skeleton: Moderate/severe cervical spondylosis with multilevel posterior disc osteophytes, uncovertebral and facet hypertrophy. This includes moderate/severe disc height loss at the C5-C6 and C6-C7 levels. There is irregular lucency within the left C3 and C4 articular pillars and extending into the left C4 lamina (series 5, images 38-41) (series 8, image 62). Within the adjacent left paraspinal musculature/soft tissues, along the left posterolateral aspect of the cervical spine, there is a peripherally enhancing multiloculated collection which spans the C1-C4 levels, measures up to 2.9 x 1.9 cm in transaxial dimensions and 3.8 cm in craniocaudal dimension (series 3, image 36) (series 8, image 70). Upper chest: No consolidation within the imaged lung apices. Partially imaged small bilateral pleural effusions (greater on the left). These results will be called to the ordering clinician or representative by the Radiologist Assistant, and communication documented in the PACS or zVision Dashboard. IMPRESSION:  1. 2.9 x 1.9 x 3.8 cm multiloculated peripherally enhancing collection within the left posterior paraspinal musculature/soft tissues spanning the C1-C4 levels. Findings are most consistent with abscess. 2. Irregular lucency within the left C3-C4 articular pillars and also involving the left C4 lamina. Findings likely reflect septic facet joint/osteomyelitis given the adjacent abscess. 3. Diffuse edema within the neck soft tissues suggestive of anasarca. Superimposed cellulitis may be present on the left. 4. Nonspecific mildly enlarged right level 2 lymph node measuring 13 mm in short axis. 5. The right vertebral artery is intermittently poorly delineated suggestive of multifocal stenoses. 6. Poor dentition with multiple absent and carious teeth, as well as periapical lucency surrounding multiple teeth. 7. Maxillary sinus disease  as described. Electronically Signed   By: Jackey Loge   On: 05/15/2019 15:11        Scheduled Meds: . amLODipine  5 mg Oral Daily  . dextrose      . escitalopram  5 mg Oral Daily  . feeding supplement  1 Container Oral Q24H  . feeding supplement (ENSURE ENLIVE)  237 mL Oral BID BM  . feeding supplement (PRO-STAT SUGAR FREE 64)  30 mL Oral Daily  . folic acid  1 mg Oral Daily  . influenza vac split quadrivalent PF  0.5 mL Intramuscular Tomorrow-1000  . insulin aspart  0-5 Units Subcutaneous QHS  . insulin aspart  0-9 Units Subcutaneous TID WC  . lipase/protease/amylase  12,000 Units Oral TID WC  . magnesium oxide  400 mg Oral BID  . multivitamin with minerals  1 tablet Oral Daily  . nicotine  14 mg Transdermal Daily  . thiamine  100 mg Oral Daily   Continuous Infusions: . dextrose 5 % and 0.45% NaCl 75 mL/hr at 05/16/19 1400  . sodium phosphate  Dextrose 5% IVPB 42 mL/hr at 05/16/19 1400  . vancomycin 500 mg (05/16/19 1435)     LOS: 2 days    Time spent: 35 minutes  Barnetta Chapel, MD [facility]   If 7PM-7AM, please contact night-coverage  [email or web address] Password [email or web address] 05/16/2019, 3:06 PM

## 2019-05-16 NOTE — Progress Notes (Signed)
Hypoglycemic Event  CBG: 28  Treatment: D50 50 mL (25 gm)  Symptoms: Pale and Hungry  Follow-up CBG: OVAN:[id number] CBG Result:148  Possible Reasons for Event: Other: NPO  Comments/MD notified: K Eubanks  Awaiting orders   [employer]

## 2019-05-17 LAB — RENAL FUNCTION PANEL
Albumin: 2.2 g/dL — ABNORMAL LOW (ref 3.5–5.0)
Anion gap: 7 (ref 5–15)
BUN: 11 mg/dL (ref 6–20)
CO2: 26 mmol/L (ref 22–32)
Calcium: 8.1 mg/dL — ABNORMAL LOW (ref 8.9–10.3)
Chloride: 100 mmol/L (ref 98–111)
Creatinine, Ser: 0.63 mg/dL (ref 0.61–1.24)
GFR calc Af Amer: >60 mL/min (ref >60)
GFR calc non Af Amer: >60 mL/min (ref >60)
Glucose, Bld: 181 mg/dL — ABNORMAL HIGH (ref 70–99)
Phosphorus: 3.6 mg/dL (ref 2.5–4.6)
Potassium: 5.1 mmol/L (ref 3.5–5.1)
Sodium: 133 mmol/L — ABNORMAL LOW (ref 135–145)

## 2019-05-17 LAB — CBC WITH DIFFERENTIAL/PLATELET
Abs Immature Granulocytes: 0.03 10*3/uL (ref 0.00–0.07)
Basophils Absolute: 0.1 10*3/uL (ref 0.0–0.1)
Basophils Relative: 1 %
Eosinophils Absolute: 0.2 10*3/uL (ref 0.0–0.5)
Eosinophils Relative: 3 %
HCT: 33.7 % — ABNORMAL LOW (ref 39.0–52.0)
Hemoglobin: 10.6 g/dL — ABNORMAL LOW (ref 13.0–17.0)
Immature Granulocytes: 0 %
Lymphocytes Relative: 26 %
Lymphs Abs: 2.1 10*3/uL (ref 0.7–4.0)
MCH: 29.9 pg (ref 26.0–34.0)
MCHC: 31.5 g/dL (ref 30.0–36.0)
MCV: 94.9 fL (ref 80.0–100.0)
Monocytes Absolute: 0.5 10*3/uL (ref 0.1–1.0)
Monocytes Relative: 6 %
Neutro Abs: 5.2 10*3/uL (ref 1.7–7.7)
Neutrophils Relative %: 64 %
Platelets: 252 10*3/uL (ref 150–400)
RBC: 3.55 MIL/uL — ABNORMAL LOW (ref 4.22–5.81)
RDW: 12.9 % (ref 11.5–15.5)
WBC: 8 10*3/uL (ref 4.0–10.5)
nRBC: 0 % (ref 0.0–0.2)

## 2019-05-17 LAB — GLUCOSE, CAPILLARY
Glucose-Capillary: 204 mg/dL — ABNORMAL HIGH (ref 70–99)
Glucose-Capillary: 254 mg/dL — ABNORMAL HIGH (ref 70–99)
Glucose-Capillary: 334 mg/dL — ABNORMAL HIGH (ref 70–99)
Glucose-Capillary: 333 mg/dL — ABNORMAL HIGH (ref 70–99)

## 2019-05-17 LAB — MAGNESIUM: Magnesium: 2 mg/dL (ref 1.7–2.4)

## 2019-05-17 MED ORDER — CEFAZOLIN SODIUM-DEXTROSE 2-4 GM/100ML-% IV SOLN
2.0000 g | Freq: Three times a day (TID) | INTRAVENOUS | Status: DC
  Administered 2019-05-17 – 2019-06-25 (×117): 2 g via INTRAVENOUS
  Filled 2019-05-17 (×122): qty 100

## 2019-05-17 NOTE — Progress Notes (Signed)
PROGRESS NOTE    Rodney Barber  WUJ:[id number] DOB: 1965-11-16 DOA: 05/13/2019 PCP: [name], NP    Brief Narrative:  53 year old male with history of insulin-dependent diabetes, hypertension, chronic pancreatitis, smoker, recent prolonged admission in the hospital complicated with initial suicidal ideation later cleared, deconditioning, now homeless presents to the emergency room with extreme weakness and leg cramps, EMS found with blood sugars of 500.  In the emergency room blood pressures elevated.  Potassium less than 2.  EKG with U waves.  Patient admitted to the hospital with hyperglycemia and severe hypokalemia as well UTI like presentation. Patient stated that he is out of insulin for more than a week.  Patient has diarrhea secondary to taking lactulose at home for high ammonia.  05/16/2019: CT neck with contrast done on 05/15/2019 revealed 2.9 x 1.9 x 3.8 cm multiloculated abscess involving C1-C4 levels, findings suggestive of likely septic facet joint/osteomyelitis of left C3-C4 with poor dentition.  TTE revealed a very small filamentous mobile density on the ventricular side of the right coronary cusp.  Infectious disease team is following patient.  Blood cultures growing MSSA.  05/17/2019: Patient seen.  Patient continues to improve.  Neck pain is improving.  IV vancomycin has been discontinued.  Patient is not on IV Ancef.  Consulted cardiology for TEE.  TEE is planned for Monday, 05/19/2019.   Assessment & Plan:   Principal Problem:   Hypokalemia Active Problems:   Uncontrolled type 2 diabetes mellitus (HCC)   UTI (urinary tract infection)   Hyperglycemia   Tobacco use   Abnormal blood electrolyte level  Neck pain/C1-C4 left posterior paraspinal abscess/C3-C4 Septic facet joint/osteomyelitis/MSSA bacteremia and possible infective endocarditis: -Patient is currently on IV vancomycin. -Blood cultures growing MSSA. -Will defer management of antibiotics to the  infectious disease team. -Likely TEE (if okay with infectious disease team) -Patient has also undergone successful ultrasound-guided aspiration of small fluid collection in the left posterior neck. -For TEE on Monday, 05/19/2019.  Continue antibiotics for now.  Severe hypokalemia: Due to ongoing GI loss in the setting of diarrhea from lactulose use.  Potassium was less than 2.  Aggressively replaced and normalizing. Continue to replace by mouth.  Recheck levels in the morning to ensure stabilization. Discontinue lactulose. 05/16/2019: Hypokalemia has resolved.  Potassium today is 5.5.  Hypophosphatemia: Severe.  Less than 1 on presentation.  Aggressively replaced, replaced further.  Recheck levels in the morning. 05/16/2019: Phosphorus today is 2.3.  Continue to monitor and replete.  Hopefully, there is no component of refeeding syndrome. 05/17/2019: Hypophosphatemia has resolved.  Continue to monitor closely.  Hyperglycemia, uncontrolled insulin-dependent diabetes: Last A1c was 17.7.  Patient is not using any insulin at home. Continue maintenance IV fluids. Will resume his home doses of insulin and uptitrate as needed. 05/16/2019: Blood sugar is better controlled.  Hypertension: On amlodipine continue.  Depression: On Lexapro continue.  Smoker: Counseled to quit.  Homelessness: Case management to help with medication and probably transition to shelter once medically stable. Will need [facility] be [facility].  Currently do not know the exact dosing, will send prescriptions nearing discharge.   DVT prophylaxis: Lovenox subcu Code Status: DNR, patient wished as per admission physician. Family Communication: None Disposition Plan: Further management depend on hospital course.   Consultants:   None  Procedures:   None  Antimicrobials:   Rocephin, 05/13/2019----05/15/2019  Vancomycin, 05/15/2019----05/17/2019  IV Ancef started on 05/17/2019  ---   Subjective: Neck pain is improving. No fever or  chills.  Objective: Vitals:   05/16/19 2104 05/17/19 0457 05/17/19 0756 05/17/19 1144  BP: (!) 160/90 (!) 149/85 (!) 155/85 (!) 148/87  Pulse: 81 75 72 76  Resp: 20 (!) 23 18 18   Temp: 98.2 F (36.8 C) 98.3 F (36.8 C) 97.9 F (36.6 C) 98.3 F (36.8 C)  TempSrc: Oral Oral Oral Oral  SpO2: 98% 97% 99% 99%  Weight:      Height:        Intake/Output Summary (Last 24 hours) at 05/17/2019 1410 Last data filed at 05/17/2019 1252 Gross per 24 hour  Intake 785 ml  Output 2025 ml  Net -1240 ml   Filed Weights   05/14/19 0240  Weight: 46.4 kg    Examination:  General exam: Appears calm and comfortable.  Patient is cachectic.   Respiratory system: Decreased air entry globally. Cardiovascular system: S1 & S2 heard Gastrointestinal system: Abdomen is nondistended, soft and nontender. No organomegaly or masses felt. Normal bowel sounds heard. Central nervous system: Alert and oriented. No focal neurological deficits. Extremities: Symmetric 5 x 5 power. Skin: No rashes, lesions or ulcers Psychiatry: Judgement and insight appear normal. Mood & affect appropriate. Has tender swelling at left of the nape of neck, 2x1 cm in size. asymetrical swelling , no neurological deficit.  No swallowing difficulties.    Data Reviewed: I have personally reviewed following labs and imaging studies  CBC: Recent Labs  Lab 05/13/19 2035 05/14/19 0438 05/16/19 0432 05/17/19 0531  WBC 10.8* 11.2* 12.1* 8.0  NEUTROABS 8.2*  --  8.5* 5.2  HGB 11.3* 11.6* 11.9* 10.6*  HCT 33.4* 35.6* 37.5* 33.7*  MCV 91.0 92.5 95.2 94.9  PLT 280 288 281 252   Basic Metabolic Panel: Recent Labs  Lab 05/13/19 2035  05/14/19 0821 05/14/19 1151 05/15/19 0438 05/16/19 0432 05/17/19 0531  NA 135  --   --  139 138 137 133*  K <2.0*   < > 3.0* 2.9* 3.9 5.5* 5.1  CL 96*  --   --  106 102 102 100  CO2 29  --   --  26 27 25 26   GLUCOSE 393*  --   --   113* 142* 43* 181*  BUN 9  --   --  8 11 12 11   CREATININE 0.80  --   --  0.60* 0.70 0.63 0.63  CALCIUM 8.0*  --   --  7.5* 7.7* 8.3* 8.1*  MG  --   --   --  1.8 1.8 1.9 2.0  PHOS  --   --   --  <1.0* 1.6* 2.3* 3.6   < > = values in this interval not displayed.   GFR: Estimated Creatinine Clearance: 69.3 mL/min (by C-G formula based on SCr of 0.63 mg/dL). Liver Function Tests: Recent Labs  Lab 05/14/19 1151 05/17/19 0531  AST 37  --   ALT 18  --   ALKPHOS 218*  --   BILITOT 0.7  --   PROT 5.2*  --   ALBUMIN 2.5* 2.2*   No results for input(s): LIPASE, AMYLASE in the last 168 hours. No results for input(s): AMMONIA in the last 168 hours. Coagulation Profile: Recent Labs  Lab 05/16/19 0432  INR 0.8   Cardiac Enzymes: No results for input(s): CKTOTAL, CKMB, CKMBINDEX, TROPONINI in the last 168 hours. BNP (last 3 results) No results for input(s): PROBNP in the last 8760 hours. HbA1C: No results for input(s): HGBA1C in the last 72 hours. CBG: Recent Labs  Lab 05/16/19 1117 05/16/19 1714 05/16/19 2104 05/17/19 0726 05/17/19 1131  GLUCAP 74 132* 343* 204* 254*   Lipid Profile: No results for input(s): CHOL, HDL, LDLCALC, TRIG, CHOLHDL, LDLDIRECT in the last 72 hours. Thyroid Function Tests: No results for input(s): TSH, T4TOTAL, FREET4, T3FREE, THYROIDAB in the last 72 hours. Anemia Panel: No results for input(s): VITAMINB12, FOLATE, FERRITIN, TIBC, IRON, RETICCTPCT in the last 72 hours. Sepsis Labs: No results for input(s): PROCALCITON, LATICACIDVEN in the last 168 hours.  Recent Results (from the past 240 hour(s))  Urine culture     Status: Abnormal   Collection Time: 05/13/19  9:51 PM   Specimen: Urine, Random  Result Value Ref Range Status   Specimen Description   Final    URINE, RANDOM Performed at [facility], [address], [place], [place] [address]    Special Requests   Final    NONE Performed at [facility],  [address], [place], [place] [address]    Culture >=100,000 COLONIES/mL STAPHYLOCOCCUS AUREUS (A)  Final   Report Status 05/16/2019 FINAL  Final   Organism ID, Bacteria STAPHYLOCOCCUS AUREUS (A)  Final      Susceptibility   Staphylococcus aureus - MIC*    CIPROFLOXACIN <=0.5 SENSITIVE Sensitive     GENTAMICIN <=0.5 SENSITIVE Sensitive     NITROFURANTOIN <=16 SENSITIVE Sensitive     OXACILLIN 0.5 SENSITIVE Sensitive     TETRACYCLINE <=1 SENSITIVE Sensitive     VANCOMYCIN 1 SENSITIVE Sensitive     TRIMETH/SULFA <=10 SENSITIVE Sensitive     CLINDAMYCIN <=0.25 SENSITIVE Sensitive     RIFAMPIN <=0.5 SENSITIVE Sensitive     Inducible Clindamycin NEGATIVE Sensitive     * >=100,000 COLONIES/mL STAPHYLOCOCCUS AUREUS  SARS CORONAVIRUS 2 (TAT 6-24 HRS) Nasopharyngeal Nasopharyngeal Swab     Status: None   Collection Time: 05/13/19 11:37 PM   Specimen: Nasopharyngeal Swab  Result Value Ref Range Status   SARS Coronavirus 2 NEGATIVE NEGATIVE Final    Comment: (NOTE) SARS-CoV-2 target nucleic acids are NOT DETECTED. The SARS-CoV-2 RNA is generally detectable in upper and lower respiratory specimens during the acute phase of infection. Negative results do not preclude SARS-CoV-2 infection, do not rule out co-infections with other pathogens, and should not be used as the sole basis for treatment or other patient management decisions. Negative results must be combined with clinical observations, patient history, and epidemiological information. The expected result is Negative. Fact Sheet for Patients: HairSlick.no Fact Sheet for Healthcare Providers: quierodirigir.com This test is not yet approved or cleared by the [place] FDA and  has been authorized for detection and/or diagnosis of SARS-CoV-2 by FDA under an Emergency Use Authorization (EUA). This EUA will remain  in effect (meaning this test can be used) for the duration of  the COVID-19 declaration under Section 56 4(b)(1) of the Act, 21 U.S.C. section 360bbb-3(b)(1), unless the authorization is terminated or revoked sooner. Performed at [facility], [address], [place], [place] [address]   Culture, blood (routine x 2)     Status: None (Preliminary result)   Collection Time: 05/15/19 12:19 PM   Specimen: BLOOD LEFT HAND  Result Value Ref Range Status   Specimen Description   Final    BLOOD LEFT HAND Performed at [facility], [address], [place], [place] [address]    Special Requests   Final    BOTTLES DRAWN AEROBIC AND ANAEROBIC Blood Culture adequate volume Performed at [facility], [address], [place], [place] [address]    Culture   Final    NO GROWTH < 24 HOURS Performed at [facility], [address], [place], [place] [address]    Report Status PENDING  Incomplete  Culture, blood (routine x 2)     Status: None (Preliminary result)   Collection Time: 05/15/19 12:25 PM   Specimen: BLOOD RIGHT ARM  Result Value Ref Range Status   Specimen Description   Final    BLOOD RIGHT ARM Performed at [facility], [address], [place], [place] [address]    Special Requests   Final    BOTTLES DRAWN AEROBIC ONLY Blood Culture adequate volume Performed at [facility], [address], [place], [place] [address]    Culture   Final    NO GROWTH < 24 HOURS Performed at [facility], [address], [place], [place] [address]    Report Status PENDING  Incomplete  Aerobic/Anaerobic Culture (surgical/deep wound)     Status: None (Preliminary result)   Collection Time: 05/16/19  4:45 PM   Specimen: Abscess  Result Value Ref Range Status   Specimen Description   Final    ABSCESS LEFT NECK Performed at [facility], [address] [place], [place] [address]    Special Requests   Final    NONE Performed at [facility], [address], [place], [place] [address]    Gram Stain   Final    ABUNDANT WBC PRESENT, PREDOMINANTLY PMN MODERATE GRAM POSITIVE COCCI IN CLUSTERS    Culture   Final    TOO YOUNG TO READ Performed at [facility], [address] [place], [place] [address]    Report Status PENDING  Incomplete         Radiology Studies: Ct Soft Tissue Neck W Contrast  Result Date: 05/15/2019 CLINICAL DATA:  Back pain, minor trauma left posterior neck pain and soft tissue swelling, suspect hematoma, muscle strain. EXAM: CT NECK WITH CONTRAST TECHNIQUE: Multidetector CT imaging of the neck was performed using the standard protocol following the bolus administration of intravenous contrast. CONTRAST:  75mL OMNIPAQUE IOHEXOL 300 MG/ML  SOLN COMPARISON:  Noncontrast head CT 02/21/2019 FINDINGS: Pharynx and larynx: There is nonspecific diffuse edema within the neck soft tissues. There is poor dentition with multiple absent and carious teeth, as well as periapical lucency surrounding multiple teeth. No discrete mass is demonstrated within the nasopharynx, oral cavity, oropharynx, hypopharynx or larynx. No significant airway compromise at the imaged levels. Salivary glands: The bilateral parotid and submandibular glands are surrounded by nonspecific edema, but intrinsically unremarkable. Thyroid: Unremarkable Lymph nodes: Nonspecific mildly enlarged right level 2 lymph node measuring 12 mm in short axis (series 8, image 32). No other pathologically enlarged cervical chain lymph nodes identified. Vascular: The cervical right vertebral artery is at times poorly delineated suggestive of multifocal stenoses. The major vascular structures of the neck appear otherwise patent. Calcified plaque at the carotid bifurcations bilaterally. Limited intracranial: Atherosclerotic calcification of the carotid artery siphons. Otherwise unremarkable. Visualized orbits: No acute abnormality Mastoids and  visualized paranasal sinuses: Mild mucosal thickening within the inferior maxillary sinuses bilaterally. There is a nonspecific calcified focus within the inferior left maxillary sinus, unchanged from prior head CT. No significant mastoid effusion at the imaged levels. Skeleton: Moderate/severe cervical spondylosis with multilevel posterior disc osteophytes, uncovertebral and facet hypertrophy. This includes moderate/severe disc height loss  at the C5-C6 and C6-C7 levels. There is irregular lucency within the left C3 and C4 articular pillars and extending into the left C4 lamina (series 5, images 38-41) (series 8, image 62). Within the adjacent left paraspinal musculature/soft tissues, along the left posterolateral aspect of the cervical spine, there is a peripherally enhancing multiloculated collection which spans the C1-C4 levels, measures up to 2.9 x 1.9 cm in transaxial dimensions and 3.8 cm in craniocaudal dimension (series 3, image 36) (series 8, image 70). Upper chest: No consolidation within the imaged lung apices. Partially imaged small bilateral pleural effusions (greater on the left). These results will be called to the ordering clinician or representative by the Radiologist Assistant, and communication documented in the PACS or zVision Dashboard. IMPRESSION: 1. 2.9 x 1.9 x 3.8 cm multiloculated peripherally enhancing collection within the left posterior paraspinal musculature/soft tissues spanning the C1-C4 levels. Findings are most consistent with abscess. 2. Irregular lucency within the left C3-C4 articular pillars and also involving the left C4 lamina. Findings likely reflect septic facet joint/osteomyelitis given the adjacent abscess. 3. Diffuse edema within the neck soft tissues suggestive of anasarca. Superimposed cellulitis may be present on the left. 4. Nonspecific mildly enlarged right level 2 lymph node measuring 13 mm in short axis. 5. The right vertebral artery is intermittently poorly  delineated suggestive of multifocal stenoses. 6. Poor dentition with multiple absent and carious teeth, as well as periapical lucency surrounding multiple teeth. 7. Maxillary sinus disease as described. Electronically Signed   By: Jackey Loge   On: 05/15/2019 15:11   Ir US Guide Bx Asp/drain  Result Date: 05/16/2019 INDICATION: 53 year old male with what appears to be an abscess in the deep musculature of the left posterior neck. He presents for ultrasound-guided aspiration for culture. EXAM: Ultrasound-guided aspiration MEDICATIONS: The patient is currently admitted to the hospital and receiving intravenous antibiotics. The antibiotics were administered within an appropriate time frame prior to the initiation of the procedure. ANESTHESIA/SEDATION: Fentanyl 50 mcg IV; Versed 1 mg IV Moderate Sedation Time:  10 minutes The patient was continuously monitored during the procedure by the interventional radiology nurse under my direct supervision. COMPLICATIONS: None immediate. PROCEDURE: Informed written consent was obtained from the patient after a thorough discussion of the procedural risks, benefits and alternatives. All questions were addressed. A timeout was performed prior to the initiation of the procedure. The region of clinical concern was interrogated with ultrasound. A small but complex fluid collection was successfully identified. The overlying skin was sterilely prepped and draped in the standard fashion using chlorhexidine skin prep. Local anesthesia was attained by infiltration with 1% lidocaine. Under real-time sonographic guidance, an 18 gauge trocar needle was carefully advanced and positioned with the tip in the fluid collection. Aspiration was then performed while agitating the trocar. Approximately 2 mL of thick purulent material was successfully aspirated. There was no immediate complication. The fluid was sent for Gram stain and culture. IMPRESSION: Successful ultrasound-guided aspiration of  small fluid collection in the left posterior neck. Electronically Signed   By: Malachy Moan M.D.   On: 05/16/2019 16:53        Scheduled Meds:  amLODipine  5 mg Oral Daily   escitalopram  5 mg Oral Daily   feeding supplement  1 Container Oral Q24H   feeding supplement (ENSURE ENLIVE)  237 mL Oral BID BM   feeding supplement (PRO-STAT SUGAR FREE 64)  30 mL Oral Daily   folic acid  1 mg Oral Daily   influenza  vac split quadrivalent PF  0.5 mL Intramuscular Tomorrow-1000   insulin aspart  0-5 Units Subcutaneous QHS   insulin aspart  0-9 Units Subcutaneous TID WC   lipase/protease/amylase  12,000 Units Oral TID WC   magnesium oxide  400 mg Oral BID   multivitamin with minerals  1 tablet Oral Daily   nicotine  14 mg Transdermal Daily   thiamine  100 mg Oral Daily   Continuous Infusions:   ceFAZolin (ANCEF) IV     dextrose 5 % and 0.45% NaCl 75 mL/hr at 05/17/19 1139     LOS: 3 days    Time spent: 25 minutes  Barnetta Chapel, MD [facility]   If 7PM-7AM, please contact night-coverage [email or web address] Password [email or web address] 05/17/2019, 2:10 PM

## 2019-05-17 NOTE — Progress Notes (Signed)
Pharmacy Antibiotic Note  Rodney Barber is a 53 y.o. male admitted on 05/13/2019 with generalized weakness and hypoglycemia.   Pharmacy has been consulted for ancef dosing for osteomyelitis.  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    Repeat BCx ng< 24 hrs Abscess aspirated by IR 10/9> looks to be growing SA TTE: + small mobile density, to have f/u TEE     \ AF, WBC WNL, SCR WNL              Plan: Ancef 2 gm IV q8h  Height: 5\' 8"  (172.7 cm) Weight: 102 lb 6.4 oz (46.4 kg) IBW/kg (Calculated) : 68.4  Temp (24hrs), Avg:98.2 F (36.8 C), Min:97.9 F (36.6 C), Max:98.3 F (36.8 C)  Recent Labs  Lab 05/13/19 2035 05/14/19 0438 05/14/19 1151 05/15/19 0438 05/16/19 0432 05/17/19 0531  WBC 10.8* 11.2*  --   --  12.1* 8.0  CREATININE 0.80  --  0.60* 0.70 0.63 0.63    Estimated Creatinine Clearance: 69.3 mL/min (by C-G formula based on SCr of 0.63 mg/dL).    No Known Allergies  Antimicrobials this admission: 10/6 CTX> 10/8 10/8 Vanc>> 10/10 1010 ancef>>  Dose adjustments this admission:  Microbiology results: 10/8 BCx2: ng< 24 hrs 10/6 Covid neg 10/6 UCx: > 100 K Staph aureus - MSSA 10/9 L neck abscess: mod GPCocci in clusters  Thank you for allowing pharmacy to be a part of this patient's care.  Eudelia Bunch, Pharm.D [phone] 05/17/2019 1:51 PM

## 2019-05-18 DIAGNOSIS — K0889 Other specified disorders of teeth and supporting structures: Secondary | ICD-10-CM

## 2019-05-18 DIAGNOSIS — D72829 Elevated white blood cell count, unspecified: Secondary | ICD-10-CM

## 2019-05-18 DIAGNOSIS — E876 Hypokalemia: Secondary | ICD-10-CM

## 2019-05-18 LAB — GLUCOSE, CAPILLARY
Glucose-Capillary: 400 mg/dL — ABNORMAL HIGH (ref 70–99)
Glucose-Capillary: 273 mg/dL — ABNORMAL HIGH (ref 70–99)
Glucose-Capillary: 129 mg/dL — ABNORMAL HIGH (ref 70–99)
Glucose-Capillary: 258 mg/dL — ABNORMAL HIGH (ref 70–99)

## 2019-05-18 MED ORDER — LISINOPRIL 10 MG PO TABS
10.0000 mg | ORAL_TABLET | Freq: Every day | ORAL | Status: DC
  Administered 2019-05-19 – 2019-06-25 (×39): 10 mg via ORAL
  Filled 2019-05-18 (×39): qty 1

## 2019-05-18 MED ORDER — ATORVASTATIN CALCIUM 20 MG PO TABS
20.0000 mg | ORAL_TABLET | Freq: Every day | ORAL | Status: DC
  Administered 2019-05-19 – 2019-06-24 (×39): 20 mg via ORAL
  Filled 2019-05-18 (×39): qty 1

## 2019-05-18 NOTE — Progress Notes (Signed)
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  No associated rash or diarrhea.  Small filamentous mobile density found on the AV.  WBC wnl.  Remains afebrile.   Physical Exam: Constitutional:  Vitals:   05/17/19 2012 05/18/19 0421  BP: 122/73 (!) 150/82  Pulse: 87 80  Resp: 18 16  Temp: 98.7 F (37.1 C) 98.5 F (36.9 C)  SpO2: 92% 95%   patient appears in NAD Eyes: anicteric HENT: poor dentition  Respiratory: Normal respiratory effort; CTA B Cardiovascular: RRR GI: soft, nt, nd  Review of Systems: Constitutional: negative for fevers and chills Respiratory: negative for cough or sputum Gastrointestinal: negative for nausea and diarrhea Integument/breast: negative for rash  Lab Results  Component Value Date   WBC 8.0 05/17/2019   HGB 10.6 (L) 05/17/2019   HCT 33.7 (L) 05/17/2019   MCV 94.9 05/17/2019   PLT 252 05/17/2019    Lab Results  Component Value Date   CREATININE 0.63 05/17/2019   BUN 11 05/17/2019   NA 133 (L) 05/17/2019   K 5.1 05/17/2019   CL 100 05/17/2019   CO2 26 05/17/2019    Lab Results  Component Value Date   ALT 18 05/14/2019   AST 37 05/14/2019   ALKPHOS 218 (H) 05/14/2019     Microbiology: Recent Results (from the past 240 hour(s))  Urine culture     Status: Abnormal   Collection Time: 05/13/19  9:51 PM   Specimen: Urine, Random  Result Value Ref Range Status   Specimen Description   Final    URINE, RANDOM Performed at [facility], [address], [place], [place] [address]    Special Requests   Final    NONE Performed at [facility], [address], [place], [place] [address]    Culture >=100,000 COLONIES/mL STAPHYLOCOCCUS AUREUS (A)  Final   Report Status 05/16/2019 FINAL  Final   Organism ID, Bacteria STAPHYLOCOCCUS AUREUS (A)  Final      Susceptibility   Staphylococcus aureus - MIC*    CIPROFLOXACIN <=0.5 SENSITIVE Sensitive     GENTAMICIN <=0.5 SENSITIVE Sensitive     NITROFURANTOIN <=16 SENSITIVE Sensitive     OXACILLIN 0.5 SENSITIVE Sensitive     TETRACYCLINE <=1 SENSITIVE Sensitive     VANCOMYCIN 1 SENSITIVE Sensitive     TRIMETH/SULFA <=10 SENSITIVE Sensitive     CLINDAMYCIN <=0.25 SENSITIVE Sensitive     RIFAMPIN <=0.5 SENSITIVE Sensitive     Inducible Clindamycin NEGATIVE Sensitive     * >=100,000 COLONIES/mL STAPHYLOCOCCUS AUREUS  SARS CORONAVIRUS 2 (TAT 6-24 HRS) Nasopharyngeal Nasopharyngeal Swab     Status: None   Collection Time: 05/13/19 11:37 PM   Specimen: Nasopharyngeal Swab  Result Value Ref Range Status   SARS Coronavirus 2 NEGATIVE NEGATIVE Final    Comment: (NOTE) SARS-CoV-2 target nucleic acids are NOT DETECTED. The SARS-CoV-2 RNA is generally detectable in upper and lower respiratory specimens during the acute phase of infection. Negative results do not preclude SARS-CoV-2 infection, do not rule out co-infections with other pathogens, and should not be used as the sole basis for treatment or other patient management decisions. Negative results must be combined with clinical observations, patient history, and epidemiological information. The expected result is Negative. Fact Sheet for Patients: HairSlick.no Fact Sheet for Healthcare Providers: quierodirigir.com This test is not yet approved or cleared by the [place] FDA and  has been authorized for detection and/or diagnosis of SARS-CoV-2 by FDA under an Emergency Use Authorization (EUA). This EUA will remain  in effect (meaning this test can be used) for the duration of the COVID-19 declaration under Section 56 4(b)(1) of the Act, 21 U.S.C. section 360bbb-3(b)(1), unless the authorization is terminated or revoked sooner. Performed at [facility], [place] [address] [place], [place] [address]    Culture, blood (routine x 2)     Status: None (Preliminary result)   Collection Time: 05/15/19 12:19 PM   Specimen: BLOOD LEFT HAND  Result Value Ref Range Status   Specimen Description   Final    BLOOD LEFT HAND Performed at [facility] [address], [place], [place] [address]    Special Requests   Final    BOTTLES DRAWN AEROBIC AND ANAEROBIC Blood Culture adequate volume Performed at [facility] [address], [place], [place] [address]    Culture   Final    NO GROWTH 2 DAYS Performed at [facility] [address], [place], [place] [address]    Report Status PENDING  Incomplete  Culture, blood (routine x 2)     Status: None (Preliminary result)   Collection Time: 05/15/19 12:25 PM   Specimen: BLOOD RIGHT ARM  Result Value Ref Range Status   Specimen Description   Final    BLOOD RIGHT ARM Performed at [facility] [address] [place], [place] [address]    Special Requests   Final    BOTTLES DRAWN AEROBIC ONLY Blood Culture adequate volume Performed at [facility] [address], [place], [place] [address]    Culture   Final    NO GROWTH 2 DAYS Performed at [facility] [address] [place], [place] [address]    Report Status PENDING  Incomplete  Aerobic/Anaerobic Culture (surgical/deep wound)     Status: None (Preliminary result)   Collection Time: 05/16/19  4:45 PM   Specimen: Abscess  Result Value Ref Range Status   Specimen Description   Final    ABSCESS LEFT NECK Performed at [facility] [address], [place], [place] [address]    Special Requests   Final    NONE Performed at [facility], [place] [address] [place], [place] [address]    Gram Stain   Final    ABUNDANT WBC PRESENT, PREDOMINANTLY PMN MODERATE GRAM POSITIVE COCCI IN CLUSTERS    Culture   Final    TOO YOUNG TO READ Performed at [facility], [place] [address], [place],  [address]    Report Status PENDING  Incomplete    Impression/Plan:  1. Cervical osteomyelitis, abscess and facet septic arthritis - aspiration growing Staph aureus and has MSSA in urine culture.  Blood cultures had not been sent prior to starting treatment but with the urine culture growth, I highly suspect bacteremia.  Also with a possible aortic vegetation. Does need a TEE and scheduled for tomorrow.  2.  Medication monitoring - creat stable, wnl.  Will continue to monitor  3.  Leukocytosis - increased on admission, likely from #1.  Now wnl and improved.  Will continue to monitor.

## 2019-05-18 NOTE — TOC Initial Note (Signed)
Transition of Care [facility]) - Initial/Assessment Note    Patient Details  Name: Rodney Barber MRN: [id number] Date of Birth: Mar 23, 1966  [facility] CM/SW Contact:    Gustavus Bryant, LCSW Phone Number: 05/18/2019, 3:05 PM  Clinical Narrative:     LCSW hand delivered housing and shelter resources to patient and encouraged pt to contact [facility] tomorrow. Resource Education provided as well.               Expected Discharge Plan: Homeless Shelter Barriers to Discharge: Continued Medical Work up   Patient Goals and CMS Choice Patient states their goals for this hospitalization and ongoing recovery are:: go home      Expected Discharge Plan and Services Expected Discharge Plan: Homeless Shelter   Discharge Planning Services: CM Consult    Prior Living Arrangements/Services   Lives with:: Self Patient language and need for interpreter reviewed:: Yes        Need for Family Participation in Patient Care: No (Comment) Care giver support system in place?: Yes (comment)   Criminal Activity/Legal Involvement Pertinent to Current Situation/Hospitalization: No - Comment as needed  Activities of Daily Living Home Assistive Devices/Equipment: Eyeglasses, Cane (specify quad or straight), Walker (specify type), CBG Meter ADL Screening (condition at time of admission) Patient's cognitive ability adequate to safely complete daily activities?: Yes Is the patient deaf or have difficulty hearing?: No Does the patient have difficulty seeing, even when wearing glasses/contacts?: No Does the patient have difficulty concentrating, remembering, or making decisions?: No Patient able to express need for assistance with ADLs?: Yes Does the patient have difficulty dressing or bathing?: No Independently performs ADLs?: Yes (appropriate for developmental age) Does the patient have difficulty walking or climbing stairs?: Yes Weakness of Legs: Both Weakness of  Arms/Hands: None  Permission Sought/Granted   Permission granted to share information with : Yes, Verbal Permission Granted       Emotional Assessment Appearance:: Appears stated age Attitude/Demeanor/Rapport: Gracious Affect (typically observed): Accepting Orientation: : Oriented to Self, Oriented to Place, Oriented to  Time, Oriented to Situation Alcohol / Substance Use: Tobacco Use, Illicit Drugs Psych Involvement: No (comment)  Admission diagnosis:  Hypokalemia [E87.6] Hyperglycemia [R73.9] Acute cystitis without hematuria [N30.00] Patient Active Problem List   Diagnosis Date Noted  . UTI (urinary tract infection) 05/14/2019  . Hyperglycemia 05/14/2019  . Tobacco use 05/14/2019  . Abnormal blood electrolyte level 05/14/2019  . Suicide attempt (HCC) 02/26/2019  . Acetaminophen toxicity   . Goals of care, counseling/discussion   . Palliative care by specialist   . Shock liver 02/18/2019  . Hyperosmolar non-ketotic state in patient with type 2 diabetes mellitus (HCC) 02/18/2019  . Alcohol use 02/18/2019  . Substance use disorder 02/18/2019  . AKI (acute kidney injury) (HCC)   . Altered behavior   . Altered mental status 12/02/2018  . Abnormal liver function 12/02/2018  . AMS (altered mental status) 12/02/2018  . Cough 12/02/2018  . Uncontrolled type 2 diabetes mellitus (HCC) 03/15/2018  . Urine test positive for microalbuminuria 07/12/2017  . Type 2 diabetes mellitus with other specified complication (HCC) 09/08/2013  . Liver lesion 07/22/2013  . 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 Diabetes (HCC) 07/18/2013  . Hypokalemia 07/11/2013  . Protein-calorie malnutrition, severe (HCC) 07/11/2013  . Chronic pancreatitis (HCC) 07/10/2013  . 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 Diabetes mellitus (HCC) 07/10/2013  . Weight loss 07/10/2013  . Diarrhea 07/10/2013  . Liver lesion, right lobe 07/10/2013  PCP:  Gildardo Pounds, NP Pharmacy:    [facility] [place] [place] [address] Phone: [phone] Fax: [place], [place] - [address] [place] [address] Phone: [phone] Fax: [phone]   Readmission Risk Interventions Readmission Risk Prevention Plan 02/26/2019 02/22/2019  Transportation Screening Complete Complete  Medication Review (RN Care Manager) Complete Complete  PCP or Specialist appointment within 3-5 days of discharge Complete Complete  HRI or Home Care Consult Complete Complete  SW Recovery Care/Counseling Consult Complete Complete  Palliative Care Screening Complete Complete  [place] - Not Applicable  Some recent data might be hidden

## 2019-05-18 NOTE — Progress Notes (Signed)
PROGRESS NOTE    Rodney Barber  WUX:[id number] DOB: [birth year] DOA: 05/13/2019 PCP: [name], NP    Brief Narrative:  53 year old male with history of insulin-dependent diabetes, hypertension, chronic pancreatitis, smoker, recent prolonged admission in the hospital complicated with initial suicidal ideation later cleared, deconditioning, now homeless presents to the emergency room with extreme weakness and leg cramps, EMS found with blood sugars of 500.  In the emergency room blood pressures elevated.  Potassium less than 2.  EKG with U waves.  Patient admitted to the hospital with hyperglycemia and severe hypokalemia as well UTI like presentation. Patient stated that he is out of insulin for more than a week.  Patient has diarrhea secondary to taking lactulose at home for high ammonia.  05/16/2019: CT neck with contrast done on 05/15/2019 revealed 2.9 x 1.9 x 3.8 cm multiloculated abscess involving C1-C4 levels, findings suggestive of likely septic facet joint/osteomyelitis of left C3-C4 with poor dentition.  TTE revealed a very small filamentous mobile density on the ventricular side of the right coronary cusp.  Infectious disease team is following patient.  Blood cultures growing MSSA.  05/17/2019: Patient seen.  Patient continues to improve.  Neck pain is improving.  IV vancomycin has been discontinued.  Patient is not on IV Ancef.  Consulted cardiology for TEE.  TEE is planned for Monday, 05/19/2019.  05/18/2019: Patient seen.  No new changes.  For TEE in the morning.  Patient will be kept n.p.o. after midnight.  Neck pain has continued to improve.  No fever or chills.   Assessment & Plan:   Principal Problem:   Hypokalemia Active Problems:   Uncontrolled type 2 diabetes mellitus (HCC)   UTI (urinary tract infection)   Hyperglycemia   Tobacco use   Abnormal blood electrolyte level  Neck pain/C1-C4 left posterior paraspinal abscess/C3-C4 Septic facet joint/osteomyelitis/MSSA  bacteremia and possible infective endocarditis: -Patient is currently on IV Ancef.   -Blood cultures growing MSSA. -Infectious disease team is directing antibiotics management.   -Patient has undergone successful ultrasound-guided aspiration of small fluid collection in the left posterior neck. -For TEE on Monday, 05/19/2019.    Severe hypokalemia:  Due to ongoing GI loss in the setting of diarrhea from lactulose use.   Potassium was less than 2.  Hypokalemia has resolved.    Hypophosphatemia:  Severe.   Phosphorus was less than 1 on presentation.   Phosphorus has been aggressively replaced. Hypophosphatemia has resolved.  Hyperglycemia, uncontrolled insulin-dependent diabetes:  Last A1c was 17.7.   Patient was not using any insulin at home.  Continue to optimize blood sugar control.   Discontinue IV fluids containing D5.   Check fasting lipid profile.  Patient needs to be on statin.  Hypertension:  On amlodipine continue. Will add lisinopril 10 mg p.o. once daily. Continue to optimize blood pressure.  Depression:  On Lexapro continue.  Smoker:  Counseled to quit.  Homelessness: Case management to help with medication and probably transition to shelter once medically stable. Will need [facility]  Currently do not know the exact dosing, will send prescriptions nearing discharge.   DVT prophylaxis: Lovenox subcu Code Status: DNR (patient's wish as per admitting physician).   Family Communication: None Disposition Plan: Further management depend on hospital course.   Consultants:  Infectious disease Cardiology for TEE. Interventional radiology for drainage of the cervical paraspinal abscess.    Procedures:   None  Antimicrobials:   Rocephin, 05/13/2019----05/15/2019  Vancomycin, 05/15/2019----05/17/2019  IV Ancef  started on 05/17/2019 ---   Subjective: Neck pain is improving. No fever or chills.  Objective: Vitals:    05/17/19 1144 05/17/19 1622 05/17/19 2012 05/18/19 0421  BP: (!) 148/87 (!) 151/79 122/73 (!) 150/82  Pulse: 76 76 87 80  Resp: 18 18 18 16   Temp: 98.3 F (36.8 C) 98 F (36.7 C) 98.7 F (37.1 C) 98.5 F (36.9 C)  TempSrc: Oral Oral Oral Oral  SpO2: 99% 98% 92% 95%  Weight:      Height:        Intake/Output Summary (Last 24 hours) at 05/18/2019 1110 Last data filed at 05/17/2019 1552 Gross per 24 hour  Intake 692 ml  Output 850 ml  Net -158 ml   Filed Weights   05/14/19 0240  Weight: 46.4 kg    Examination:  General exam: Appears calm and comfortable.  Patient is cachectic.   Respiratory system: Decreased air entry globally. Cardiovascular system: S1 & S2 heard Gastrointestinal system: Abdomen is nondistended, soft and nontender. No organomegaly or masses felt. Normal bowel sounds heard. Central nervous system: Alert and oriented. No focal neurological deficits. Extremities: No leg edema.  Data Reviewed: I have personally reviewed following labs and imaging studies  CBC: Recent Labs  Lab 05/13/19 2035 05/14/19 0438 05/16/19 0432 05/17/19 0531  WBC 10.8* 11.2* 12.1* 8.0  NEUTROABS 8.2*  --  8.5* 5.2  HGB 11.3* 11.6* 11.9* 10.6*  HCT 33.4* 35.6* 37.5* 33.7*  MCV 91.0 92.5 95.2 94.9  PLT 280 288 281 252   Basic Metabolic Panel: Recent Labs  Lab 05/13/19 2035  05/14/19 0821 05/14/19 1151 05/15/19 0438 05/16/19 0432 05/17/19 0531  NA 135  --   --  139 138 137 133*  K <2.0*   < > 3.0* 2.9* 3.9 5.5* 5.1  CL 96*  --   --  106 102 102 100  CO2 29  --   --  26 27 25 26   GLUCOSE 393*  --   --  113* 142* 43* 181*  BUN 9  --   --  8 11 12 11   CREATININE 0.80  --   --  0.60* 0.70 0.63 0.63  CALCIUM 8.0*  --   --  7.5* 7.7* 8.3* 8.1*  MG  --   --   --  1.8 1.8 1.9 2.0  PHOS  --   --   --  <1.0* 1.6* 2.3* 3.6   < > = values in this interval not displayed.   GFR: Estimated Creatinine Clearance: 69.3 mL/min (by C-G formula based on SCr of 0.63 mg/dL). Liver  Function Tests: Recent Labs  Lab 05/14/19 1151 05/17/19 0531  AST 37  --   ALT 18  --   ALKPHOS 218*  --   BILITOT 0.7  --   PROT 5.2*  --   ALBUMIN 2.5* 2.2*   No results for input(s): LIPASE, AMYLASE in the last 168 hours. No results for input(s): AMMONIA in the last 168 hours. Coagulation Profile: Recent Labs  Lab 05/16/19 0432  INR 0.8   Cardiac Enzymes: No results for input(s): CKTOTAL, CKMB, CKMBINDEX, TROPONINI in the last 168 hours. BNP (last 3 results) No results for input(s): PROBNP in the last 8760 hours. HbA1C: No results for input(s): HGBA1C in the last 72 hours. CBG: Recent Labs  Lab 05/17/19 0726 05/17/19 1131 05/17/19 1609 05/17/19 2150 05/18/19 0753  GLUCAP 204* 254* 334* 333* 400*   Lipid Profile: No results for input(s): CHOL, HDL, LDLCALC, TRIG,  CHOLHDL, LDLDIRECT in the last 72 hours. Thyroid Function Tests: No results for input(s): TSH, T4TOTAL, FREET4, T3FREE, THYROIDAB in the last 72 hours. Anemia Panel: No results for input(s): VITAMINB12, FOLATE, FERRITIN, TIBC, IRON, RETICCTPCT in the last 72 hours. Sepsis Labs: No results for input(s): PROCALCITON, LATICACIDVEN in the last 168 hours.  Recent Results (from the past 240 hour(s))  Urine culture     Status: Abnormal   Collection Time: 05/13/19  9:51 PM   Specimen: Urine, Random  Result Value Ref Range Status   Specimen Description   Final    URINE, RANDOM Performed at [facility], [address] [place], [place] [address]    Special Requests   Final    NONE Performed at [facility], [address] [place], [place] [address]    Culture >=100,000 COLONIES/mL STAPHYLOCOCCUS AUREUS (A)  Final   Report Status 05/16/2019 FINAL  Final   Organism ID, Bacteria STAPHYLOCOCCUS AUREUS (A)  Final      Susceptibility   Staphylococcus aureus - MIC*    CIPROFLOXACIN <=0.5 SENSITIVE Sensitive     GENTAMICIN <=0.5 SENSITIVE Sensitive     NITROFURANTOIN  <=16 SENSITIVE Sensitive     OXACILLIN 0.5 SENSITIVE Sensitive     TETRACYCLINE <=1 SENSITIVE Sensitive     VANCOMYCIN 1 SENSITIVE Sensitive     TRIMETH/SULFA <=10 SENSITIVE Sensitive     CLINDAMYCIN <=0.25 SENSITIVE Sensitive     RIFAMPIN <=0.5 SENSITIVE Sensitive     Inducible Clindamycin NEGATIVE Sensitive     * >=100,000 COLONIES/mL STAPHYLOCOCCUS AUREUS  SARS CORONAVIRUS 2 (TAT 6-24 HRS) Nasopharyngeal Nasopharyngeal Swab     Status: None   Collection Time: 05/13/19 11:37 PM   Specimen: Nasopharyngeal Swab  Result Value Ref Range Status   SARS Coronavirus 2 NEGATIVE NEGATIVE Final    Comment: (NOTE) SARS-CoV-2 target nucleic acids are NOT DETECTED. The SARS-CoV-2 RNA is generally detectable in upper and lower respiratory specimens during the acute phase of infection. Negative results do not preclude SARS-CoV-2 infection, do not rule out co-infections with other pathogens, and should not be used as the sole basis for treatment or other patient management decisions. Negative results must be combined with clinical observations, patient history, and epidemiological information. The expected result is Negative. Fact Sheet for Patients: HairSlick.no Fact Sheet for Healthcare Providers: quierodirigir.com This test is not yet approved or cleared by the [place] FDA and  has been authorized for detection and/or diagnosis of SARS-CoV-2 by FDA under an Emergency Use Authorization (EUA). This EUA will remain  in effect (meaning this test can be used) for the duration of the COVID-19 declaration under Section 56 4(b)(1) of the Act, 21 U.S.C. section 360bbb-3(b)(1), unless the authorization is terminated or revoked sooner. Performed at [facility], [address], [place], [place] [address]   Culture, blood (routine x 2)     Status: None (Preliminary result)   Collection Time: 05/15/19 12:19 PM   Specimen: BLOOD LEFT  HAND  Result Value Ref Range Status   Specimen Description   Final    BLOOD LEFT HAND Performed at [facility], [address] [place], [place] [address]    Special Requests   Final    BOTTLES DRAWN AEROBIC AND ANAEROBIC Blood Culture adequate volume Performed at [facility], [address], [place], [place] [address]    Culture   Final    NO GROWTH 2 DAYS Performed at [facility],  [address] [place], [place] [address]    Report Status PENDING  Incomplete  Culture, blood (routine x 2)     Status: None (Preliminary result)   Collection Time: 05/15/19 12:25 PM   Specimen: BLOOD RIGHT ARM  Result Value Ref Range Status   Specimen Description   Final    BLOOD RIGHT ARM Performed at [facility], [address], [place], [place] [address]    Special Requests   Final    BOTTLES DRAWN AEROBIC ONLY Blood Culture adequate volume Performed at [facility], [address], [place], [place] [address]    Culture   Final    NO GROWTH 2 DAYS Performed at [facility], [address] [place], [place] [address]    Report Status PENDING  Incomplete  Aerobic/Anaerobic Culture (surgical/deep wound)     Status: None (Preliminary result)   Collection Time: 05/16/19  4:45 PM   Specimen: Abscess  Result Value Ref Range Status   Specimen Description   Final    ABSCESS LEFT NECK Performed at [facility], [address], [place], [place] [address]    Special Requests   Final    NONE Performed at [facility], [address], [place], [place] [address]    Gram Stain   Final    ABUNDANT WBC PRESENT, PREDOMINANTLY PMN MODERATE GRAM POSITIVE COCCI IN CLUSTERS    Culture   Final    TOO YOUNG TO READ Performed at [facility], [address], [place], [place] [address]    Report Status PENDING  Incomplete         Radiology Studies: Ir US Guide Bx  Asp/drain  Result Date: 05/16/2019 INDICATION: 53 year old male with what appears to be an abscess in the deep musculature of the left posterior neck. He presents for ultrasound-guided aspiration for culture. EXAM: Ultrasound-guided aspiration MEDICATIONS: The patient is currently admitted to the hospital and receiving intravenous antibiotics. The antibiotics were administered within an appropriate time frame prior to the initiation of the procedure. ANESTHESIA/SEDATION: Fentanyl 50 mcg IV; Versed 1 mg IV Moderate Sedation Time:  10 minutes The patient was continuously monitored during the procedure by the interventional radiology nurse under my direct supervision. COMPLICATIONS: None immediate. PROCEDURE: Informed written consent was obtained from the patient after a thorough discussion of the procedural risks, benefits and alternatives. All questions were addressed. A timeout was performed prior to the initiation of the procedure. The region of clinical concern was interrogated with ultrasound. A small but complex fluid collection was successfully identified. The overlying skin was sterilely prepped and draped in the standard fashion using chlorhexidine skin prep. Local anesthesia was attained by infiltration with 1% lidocaine. Under real-time sonographic guidance, an 18 gauge trocar needle was carefully advanced and positioned with the tip in the fluid collection. Aspiration was then performed while agitating the trocar. Approximately 2 mL of thick purulent material was successfully aspirated. There was no immediate complication. The fluid was sent for Gram stain and culture. IMPRESSION: Successful ultrasound-guided aspiration of small fluid collection in the left posterior neck. Electronically Signed   By: Malachy Moan M.D.   On: 05/16/2019 16:53        Scheduled Meds:  amLODipine  5 mg Oral Daily   escitalopram  5 mg Oral Daily   feeding supplement  1 Container Oral Q24H   feeding  supplement (ENSURE ENLIVE)  237 mL Oral BID BM   feeding  supplement (PRO-STAT SUGAR FREE 64)  30 mL Oral Daily   folic acid  1 mg Oral Daily   influenza vac split quadrivalent PF  0.5 mL Intramuscular Tomorrow-1000   insulin aspart  0-5 Units Subcutaneous QHS   insulin aspart  0-9 Units Subcutaneous TID WC   lipase/protease/amylase  12,000 Units Oral TID WC   magnesium oxide  400 mg Oral BID   multivitamin with minerals  1 tablet Oral Daily   nicotine  14 mg Transdermal Daily   thiamine  100 mg Oral Daily   Continuous Infusions:   ceFAZolin (ANCEF) IV 2 g (05/18/19 0545)   dextrose 5 % and 0.45% NaCl 75 mL/hr at 05/17/19 1139     LOS: 4 days    Time spent: 25 minutes  Barnetta Chapel, MD [facility]   If 7PM-7AM, please contact night-coverage [email or web address] Password [email or web address] 05/18/2019, 11:10 AM

## 2019-05-19 LAB — CBC WITH DIFFERENTIAL/PLATELET
Abs Immature Granulocytes: 0.02 10*3/uL (ref 0.00–0.07)
Basophils Absolute: 0.1 10*3/uL (ref 0.0–0.1)
Basophils Relative: 1 %
Eosinophils Absolute: 0.1 10*3/uL (ref 0.0–0.5)
Eosinophils Relative: 2 %
HCT: 30.9 % — ABNORMAL LOW (ref 39.0–52.0)
Hemoglobin: 9.8 g/dL — ABNORMAL LOW (ref 13.0–17.0)
Immature Granulocytes: 0 %
Lymphocytes Relative: 27 %
Lymphs Abs: 1.9 10*3/uL (ref 0.7–4.0)
MCH: 29.9 pg (ref 26.0–34.0)
MCHC: 31.7 g/dL (ref 30.0–36.0)
MCV: 94.2 fL (ref 80.0–100.0)
Monocytes Absolute: 0.5 10*3/uL (ref 0.1–1.0)
Monocytes Relative: 6 %
Neutro Abs: 4.5 10*3/uL (ref 1.7–7.7)
Neutrophils Relative %: 64 %
Platelets: 306 10*3/uL (ref 150–400)
RBC: 3.28 MIL/uL — ABNORMAL LOW (ref 4.22–5.81)
RDW: 12.5 % (ref 11.5–15.5)
WBC: 7 10*3/uL (ref 4.0–10.5)
nRBC: 0 % (ref 0.0–0.2)

## 2019-05-19 LAB — RENAL FUNCTION PANEL
Albumin: 2.3 g/dL — ABNORMAL LOW (ref 3.5–5.0)
Anion gap: 8 (ref 5–15)
BUN: 16 mg/dL (ref 6–20)
CO2: 29 mmol/L (ref 22–32)
Calcium: 8.4 mg/dL — ABNORMAL LOW (ref 8.9–10.3)
Chloride: 99 mmol/L (ref 98–111)
Creatinine, Ser: 0.63 mg/dL (ref 0.61–1.24)
GFR calc Af Amer: >60 mL/min (ref >60)
GFR calc non Af Amer: >60 mL/min (ref >60)
Glucose, Bld: 169 mg/dL — ABNORMAL HIGH (ref 70–99)
Phosphorus: 3.8 mg/dL (ref 2.5–4.6)
Potassium: 4.3 mmol/L (ref 3.5–5.1)
Sodium: 136 mmol/L (ref 135–145)

## 2019-05-19 LAB — GLUCOSE, CAPILLARY
Glucose-Capillary: 222 mg/dL — ABNORMAL HIGH (ref 70–99)
Glucose-Capillary: 310 mg/dL — ABNORMAL HIGH (ref 70–99)
Glucose-Capillary: 261 mg/dL — ABNORMAL HIGH (ref 70–99)
Glucose-Capillary: 343 mg/dL — ABNORMAL HIGH (ref 70–99)

## 2019-05-19 LAB — MAGNESIUM: Magnesium: 1.8 mg/dL (ref 1.7–2.4)

## 2019-05-19 MED ORDER — SODIUM CHLORIDE 0.9 % IV SOLN: INTRAVENOUS | Status: DC

## 2019-05-19 MED ORDER — INSULIN GLARGINE 100 UNIT/ML ~~LOC~~ SOLN
10.0000 [IU] | Freq: Every day | SUBCUTANEOUS | Status: DC
  Administered 2019-05-19 – 2019-05-22 (×4): 10 [IU] via SUBCUTANEOUS
  Filled 2019-05-19 (×5): qty 0.1

## 2019-05-19 NOTE — Progress Notes (Signed)
PROGRESS NOTE    Rodney Barber  JYN:[id number] DOB: August 09, 1965 DOA: 05/13/2019 PCP: Gildardo Pounds, NP    Brief Narrative:  53 year old male with history of insulin-dependent diabetes, hypertension, chronic pancreatitis, smoker, recent prolonged admission in the hospital complicated with initial suicidal ideation later cleared, deconditioning, now homeless presents to the emergency room with extreme weakness and leg cramps, EMS found with blood sugars of 500.  In the emergency room blood pressures elevated.  Potassium less than 2.  EKG with U waves.  Patient admitted to the hospital with hyperglycemia and severe hypokalemia as well UTI like presentation. Patient stated that he is out of insulin for more than a week.  Patient had diarrhea secondary to taking lactulose at home for high ammonia.    Assessment & Plan:   Principal Problem:   Hypokalemia Active Problems:   Uncontrolled type 2 diabetes mellitus (HCC)   UTI (urinary tract infection)   Hyperglycemia   Tobacco use   Abnormal blood electrolyte level  Severe hypokalemia:  Due to ongoing GI loss in the setting of diarrhea from lactulose use.  Potassium was less than 2.  Aggressively replaced and normalizing.   Hypophosphatemia: Severe.  Less than 1 on presentation.   Aggressively replaced and normalized.  MSSA UTI , MSSA left neck abscess and C-spine osteomyelitis:  Urine culture with MSSA, left neck aspirate with MSSA, blood cultures drawn after 2 days on Rocephin, no growth so far. Clinically stabilizing.  Remains on cefazolin.  For TEE tomorrow. May need 6 weeks of IV antibiotics.  Hyperglycemia, uncontrolled insulin-dependent diabetes: Last A1c was 17.7.  Patient is not using any insulin at home.  Patient was on escalating dose of insulin, had a hypoglycemic event on 05/16/2019 morning, discontinued long-acting insulin. Blood sugars more than 200. Start lower dose of Lantus and continue sliding scale insulin.   Hypertension: On amlodipine continue.  Depression: On Lexapro continue.  Smoker: Counseled to quit.  Has nicotine patch.  Homelessness: Case management to help with medication and probably transition to shelter once medically stable. Anticipating patient may need prolonged hospitalization for intravenous antibiotics.   DVT prophylaxis: Lovenox subcu Code Status: DNR,  Family Communication: None Disposition Plan: Unknown at this time.   Consultants:   Infectious disease  Procedures:   Left neck abscess aspiration  Antimicrobials:   Rocephin, 05/13/2019----05/15/2019  Vancomycin, 05/15/2019----05/17/2019  Ancef, 05/17/2019----    Subjective: Patient seen and examined.  No overnight events.  Denies any nausea or vomiting.  Objective: Vitals:   05/18/19 0421 05/18/19 1429 05/18/19 2046 05/19/19 0434  BP: (!) 150/82 (!) 142/82 (!) 147/84 (!) 141/86  Pulse: 80 86 78 74  Resp: 16 16 18 18   Temp: 98.5 F (36.9 C) 98.6 F (37 C) 98.7 F (37.1 C) 98.7 F (37.1 C)  TempSrc: Oral Oral Oral Oral  SpO2: 95% 96% 96%   Weight:      Height:        Intake/Output Summary (Last 24 hours) at 05/19/2019 1213 Last data filed at 05/19/2019 0505 Gross per 24 hour  Intake 524.67 ml  Output -  Net 524.67 ml   Filed Weights   05/14/19 0240  Weight: 46.4 kg    Examination:  General exam: Appears calm and comfortable, chronically sick looking.  Not in any distress.  On room air. Respiratory system: Clear to auscultation. Respiratory effort normal. Cardiovascular system: S1 & S2 heard, RRR. No JVD, murmurs, rubs, gallops or clicks. No pedal edema. Gastrointestinal system: Abdomen is nondistended, soft  and nontender. No organomegaly or masses felt. Normal bowel sounds heard. Central nervous system: Alert and oriented. No focal neurological deficits. Extremities: Symmetric 5 x 5 power. Skin: No rashes, lesions or ulcers Psychiatry: Judgement and insight appear normal. Mood &  affect appropriate.    Data Reviewed: I have personally reviewed following labs and imaging studies  CBC: Recent Labs  Lab 05/13/19 2035 05/14/19 0438 05/16/19 0432 05/17/19 0531 05/19/19 0454  WBC 10.8* 11.2* 12.1* 8.0 7.0  NEUTROABS 8.2*  --  8.5* 5.2 4.5  HGB 11.3* 11.6* 11.9* 10.6* 9.8*  HCT 33.4* 35.6* 37.5* 33.7* 30.9*  MCV 91.0 92.5 95.2 94.9 94.2  PLT 280 288 281 252 306   Basic Metabolic Panel: Recent Labs  Lab 05/14/19 1151 05/15/19 0438 05/16/19 0432 05/17/19 0531 05/19/19 0454  NA 139 138 137 133* 136  K 2.9* 3.9 5.5* 5.1 4.3  CL 106 102 102 100 99  CO2 26 27 25 26 29   GLUCOSE 113* 142* 43* 181* 169*  BUN 8 11 12 11 16   CREATININE 0.60* 0.70 0.63 0.63 0.63  CALCIUM 7.5* 7.7* 8.3* 8.1* 8.4*  MG 1.8 1.8 1.9 2.0 1.8  PHOS <1.0* 1.6* 2.3* 3.6 3.8   GFR: Estimated Creatinine Clearance: 69.3 mL/min (by C-G formula based on SCr of 0.63 mg/dL). Liver Function Tests: Recent Labs  Lab 05/14/19 1151 05/17/19 0531 05/19/19 0454  AST 37  --   --   ALT 18  --   --   ALKPHOS 218*  --   --   BILITOT 0.7  --   --   PROT 5.2*  --   --   ALBUMIN 2.5* 2.2* 2.3*   No results for input(s): LIPASE, AMYLASE in the last 168 hours. No results for input(s): AMMONIA in the last 168 hours. Coagulation Profile: Recent Labs  Lab 05/16/19 0432  INR 0.8   Cardiac Enzymes: No results for input(s): CKTOTAL, CKMB, CKMBINDEX, TROPONINI in the last 168 hours. BNP (last 3 results) No results for input(s): PROBNP in the last 8760 hours. HbA1C: No results for input(s): HGBA1C in the last 72 hours. CBG: Recent Labs  Lab 05/18/19 0753 05/18/19 1225 05/18/19 1717 05/18/19 2047 05/19/19 0757  GLUCAP 400* 273* 129* 258* 222*   Lipid Profile: No results for input(s): CHOL, HDL, LDLCALC, TRIG, CHOLHDL, LDLDIRECT in the last 72 hours. Thyroid Function Tests: No results for input(s): TSH, T4TOTAL, FREET4, T3FREE, THYROIDAB in the last 72 hours. Anemia Panel: No results  for input(s): VITAMINB12, FOLATE, FERRITIN, TIBC, IRON, RETICCTPCT in the last 72 hours. Sepsis Labs: No results for input(s): PROCALCITON, LATICACIDVEN in the last 168 hours.  Recent Results (from the past 240 hour(s))  Urine culture     Status: Abnormal   Collection Time: 05/13/19  9:51 PM   Specimen: Urine, Random  Result Value Ref Range Status   Specimen Description   Final    URINE, RANDOM Performed at [facility], [address] [place], [place] [address]    Special Requests   Final    NONE Performed at [facility], [address] [place], [place] [address]    Culture >=100,000 COLONIES/mL STAPHYLOCOCCUS AUREUS (A)  Final   Report Status 05/16/2019 FINAL  Final   Organism ID, Bacteria STAPHYLOCOCCUS AUREUS (A)  Final      Susceptibility   Staphylococcus aureus - MIC*    CIPROFLOXACIN <=0.5 SENSITIVE Sensitive     GENTAMICIN <=0.5 SENSITIVE Sensitive     NITROFURANTOIN <=16  SENSITIVE Sensitive     OXACILLIN 0.5 SENSITIVE Sensitive     TETRACYCLINE <=1 SENSITIVE Sensitive     VANCOMYCIN 1 SENSITIVE Sensitive     TRIMETH/SULFA <=10 SENSITIVE Sensitive     CLINDAMYCIN <=0.25 SENSITIVE Sensitive     RIFAMPIN <=0.5 SENSITIVE Sensitive     Inducible Clindamycin NEGATIVE Sensitive     * >=100,000 COLONIES/mL STAPHYLOCOCCUS AUREUS  SARS CORONAVIRUS 2 (TAT 6-24 HRS) Nasopharyngeal Nasopharyngeal Swab     Status: None   Collection Time: 05/13/19 11:37 PM   Specimen: Nasopharyngeal Swab  Result Value Ref Range Status   SARS Coronavirus 2 NEGATIVE NEGATIVE Final    Comment: (NOTE) SARS-CoV-2 target nucleic acids are NOT DETECTED. The SARS-CoV-2 RNA is generally detectable in upper and lower respiratory specimens during the acute phase of infection. Negative results do not preclude SARS-CoV-2 infection, do not rule out co-infections with other pathogens, and should not be used as the sole basis for treatment or other patient management  decisions. Negative results must be combined with clinical observations, patient history, and epidemiological information. The expected result is Negative. Fact Sheet for Patients: HairSlick.no Fact Sheet for Healthcare Providers: quierodirigir.com This test is not yet approved or cleared by the [place] FDA and  has been authorized for detection and/or diagnosis of SARS-CoV-2 by FDA under an Emergency Use Authorization (EUA). This EUA will remain  in effect (meaning this test can be used) for the duration of the COVID-19 declaration under Section 56 4(b)(1) of the Act, 21 U.S.C. section 360bbb-3(b)(1), unless the authorization is terminated or revoked sooner. Performed at [facility], [address] [place], [place] [address]   Culture, blood (routine x 2)     Status: None (Preliminary result)   Collection Time: 05/15/19 12:19 PM   Specimen: BLOOD LEFT HAND  Result Value Ref Range Status   Specimen Description   Final    BLOOD LEFT HAND Performed at [facility], [address], [place], [place] [address]    Special Requests   Final    BOTTLES DRAWN AEROBIC AND ANAEROBIC Blood Culture adequate volume Performed at [facility], [address], [place], [place] [address]    Culture   Final    NO GROWTH 4 DAYS Performed at [facility], [address] [place], [place] [address]    Report Status PENDING  Incomplete  Culture, blood (routine x 2)     Status: None (Preliminary result)   Collection Time: 05/15/19 12:25 PM   Specimen: BLOOD RIGHT ARM  Result Value Ref Range Status   Specimen Description   Final    BLOOD RIGHT ARM Performed at [facility], [address], [place], [place] [address]    Special Requests   Final    BOTTLES DRAWN AEROBIC ONLY Blood Culture adequate volume Performed at [facility], [address]  [place], [place] [address]    Culture   Final    NO GROWTH 4 DAYS Performed at [facility], [address], [place], [place] [address]    Report Status PENDING  Incomplete  Aerobic/Anaerobic Culture (surgical/deep wound)     Status: None (Preliminary result)   Collection Time: 05/16/19  4:45 PM   Specimen: Abscess  Result Value Ref Range Status   Specimen Description   Final    ABSCESS LEFT NECK Performed at [facility], [address] [place], [place] [address]    Special  Requests   Final    NONE Performed at [facility], [address] [place], [place] [address]    Gram Stain   Final    ABUNDANT WBC PRESENT, PREDOMINANTLY PMN MODERATE GRAM POSITIVE COCCI IN CLUSTERS Performed at [facility], [address], [place], [place] [address]    Culture   Final    ABUNDANT STAPHYLOCOCCUS AUREUS NO ANAEROBES ISOLATED; CULTURE IN PROGRESS FOR 5 DAYS    Report Status PENDING  Incomplete   Organism ID, Bacteria STAPHYLOCOCCUS AUREUS  Final      Susceptibility   Staphylococcus aureus - MIC*    CIPROFLOXACIN <=0.5 SENSITIVE Sensitive     ERYTHROMYCIN <=0.25 SENSITIVE Sensitive     GENTAMICIN <=0.5 SENSITIVE Sensitive     OXACILLIN 0.5 SENSITIVE Sensitive     TETRACYCLINE <=1 SENSITIVE Sensitive     VANCOMYCIN <=0.5 SENSITIVE Sensitive     TRIMETH/SULFA <=10 SENSITIVE Sensitive     CLINDAMYCIN <=0.25 SENSITIVE Sensitive     RIFAMPIN <=0.5 SENSITIVE Sensitive     Inducible Clindamycin NEGATIVE Sensitive     * ABUNDANT STAPHYLOCOCCUS AUREUS         Radiology Studies: No results found.      Scheduled Meds: . amLODipine  5 mg Oral Daily  . atorvastatin  20 mg Oral q1800  . escitalopram  5 mg Oral Daily  . feeding supplement  1 Container Oral Q24H  . feeding supplement (ENSURE ENLIVE)  237 mL Oral BID BM  . feeding supplement (PRO-STAT SUGAR FREE 64)  30 mL Oral Daily  . folic acid  1 mg Oral Daily  . influenza vac split  quadrivalent PF  0.5 mL Intramuscular Tomorrow-1000  . insulin aspart  0-5 Units Subcutaneous QHS  . insulin aspart  0-9 Units Subcutaneous TID WC  . lipase/protease/amylase  12,000 Units Oral TID WC  . lisinopril  10 mg Oral Daily  . magnesium oxide  400 mg Oral BID  . multivitamin with minerals  1 tablet Oral Daily  . nicotine  14 mg Transdermal Daily  . thiamine  100 mg Oral Daily   Continuous Infusions: .  ceFAZolin (ANCEF) IV 2 g (05/19/19 0541)     LOS: 5 days    Time spent: 30 minutes    Dorcas Carrow, MD [facility] Pager [phone]  If 7PM-7AM, please contact night-coverage [email or web address] Password [email or web address] 05/19/2019, 12:13 PM

## 2019-05-19 NOTE — Progress Notes (Signed)
Occupational Therapy Treatment Patient Details Name: Rodney Barber MRN: [id number] DOB: Jul 08, 1966 Today's Date: 05/19/2019    History of present illness 53 year old male with history of insulin-dependent diabetes, hypertension, chronic pancreatitis, smoker, recent prolonged admission in the hospital complicated with initial suicidal ideation later cleared, deconditioning, now homeless presents to the emergency room with extreme weakness and leg cramps, EMS found with blood sugars of 500,severe hypokalemia, hypophosphatemia, and MSSA UTI , MSSA left neck abscess and C-spine osteomyelitis   OT comments  Pt progressing well, now at an overall setup/ S level in room with basic ADLs.   Follow Up Recommendations  No OT follow up    Equipment Recommendations  None recommended by OT       Precautions / Restrictions Precautions Precautions: Fall Restrictions Weight Bearing Restrictions: No       Mobility Bed Mobility Overal bed mobility: Independent Bed Mobility: Supine to Sit           General bed mobility comments: oob with OT  Transfers Overall transfer level: Needs assistance Equipment used: None Transfers: Sit to/from Stand Sit to Stand: Supervision         General transfer comment: for safety    Balance Overall balance assessment: Mild deficits observed, not formally tested   Sitting balance-Leahy Scale: Good     Standing balance support: No upper extremity supported Standing balance-Leahy Scale: Fair Standing balance comment: standing at sink to brush teeth                           ADL either performed or assessed with clinical judgement   ADL Overall ADL's : Needs assistance/impaired     Grooming: Set up;Supervision/safety;Standing               Lower Body Dressing: Set up;Supervision/safety;Sit to/from stand   Toilet Transfer: Supervision/safety;Ambulation;Regular Toilet;Grab bars                   Vision Patient  Visual Report: No change from baseline            Cognition Arousal/Alertness: Awake/alert Behavior During Therapy: WFL for tasks assessed/performed Overall Cognitive Status: Within Functional Limits for tasks assessed                                                     Pertinent Vitals/ Pain       Pain Assessment: Faces Faces Pain Scale: Hurts a little bit Pain Location: neck Pain Descriptors / Indicators: Sore(able to almost achieve full AROM today) Pain Intervention(s): Monitored during session         Frequency  Min 2X/week        Progress Toward Goals  OT Goals(current goals can now be found in the care plan section)  Progress towards OT goals: Progressing toward goals     Plan Discharge plan remains appropriate       AM-PAC OT "6 Clicks" Daily Activity     Outcome Measure   Help from another person eating meals?: None Help from another person taking care of personal grooming?: A Little Help from another person toileting, which includes using toliet, bedpan, or urinal?: A Little Help from another person bathing (including washing, rinsing, drying)?: A Little Help from another person to put on and taking off regular upper body clothing?: A  Little Help from another person to put on and taking off regular lower body clothing?: A Little 6 Click Score: 19    End of Session Equipment Utilized During Treatment: Gait belt(pushing IV pole intermittently)  OT Visit Diagnosis: Unsteadiness on feet (R26.81);Pain Pain - Right/Left: Left Pain - part of body: (neck)   Activity Tolerance Patient tolerated treatment well   Patient Left in chair;with call bell/phone within reach;with chair alarm set           Time: 7824-2353 OT Time Calculation (min): 16 min  Charges: OT General Charges $OT Visit: 1 Visit OT Treatments $Self Care/Home Management : 8-22 mins  Rodney Barber, OTR/L Acute [employer] Pager [phone] Office  [phone]      Rodney Barber 05/19/2019, 5:30 PM

## 2019-05-19 NOTE — Progress Notes (Signed)
Physical Therapy Treatment Patient Details Name: Rodney Barber MRN: [id number] DOB: 04-28-1966 Today's Date: 05/19/2019    History of Present Illness 53 year old male with history of insulin-dependent diabetes, hypertension, chronic pancreatitis, smoker, recent prolonged admission in the hospital complicated with initial suicidal ideation later cleared, deconditioning, now homeless presents to the emergency room with extreme weakness and leg cramps, EMS found with blood sugars of 500,severe hypokalemia, hypophosphatemia, and MSSA UTI , MSSA left neck abscess and C-spine osteomyelitis    PT Comments    Progressing with mobility. Pt stated his neck feels better. Will continue to follow.    Follow Up Recommendations  No PT follow up     Equipment Recommendations  None recommended by PT    Recommendations for Other Services       Precautions / Restrictions Precautions Precautions: Fall Restrictions Weight Bearing Restrictions: No    Mobility  Bed Mobility               General bed mobility comments: oob with OT  Transfers Overall transfer level: Needs assistance Equipment used: None Transfers: Sit to/from Stand Sit to Stand: Supervision         General transfer comment: for safety  Ambulation/Gait Ambulation/Gait assistance: Min guard Gait Distance (Feet): 100 Feet Assistive device: Straight cane Gait Pattern/deviations: Step-through pattern;Decreased stride length     General Gait Details: Pt feels cane provides better stability when ambulating. Fair gait speed. Mildly unsteady intermittently.   Stairs             Wheelchair Mobility    Modified Rankin (Stroke Patients Only)       Balance Overall balance assessment: Mild deficits observed, not formally tested           Standing balance-Leahy Scale: Fair                              Cognition Arousal/Alertness: Awake/alert Behavior During Therapy: WFL for tasks  assessed/performed Overall Cognitive Status: Within Functional Limits for tasks assessed                                        Exercises      General Comments        Pertinent Vitals/Pain Pain Assessment: Faces Faces Pain Scale: Hurts a little bit Pain Location: neck Pain Descriptors / Indicators: Sore Pain Intervention(s): Monitored during session    Home Living                      Prior Function            PT Goals (current goals can now be found in the care plan section) Progress towards PT goals: Progressing toward goals    Frequency    Min 3X/week      PT Plan Current plan remains appropriate    Co-evaluation              AM-PAC PT "6 Clicks" Mobility   Outcome Measure  Help needed turning from your back to your side while in a flat bed without using bedrails?: None Help needed moving from lying on your back to sitting on the side of a flat bed without using bedrails?: None Help needed moving to and from a bed to a chair (including a wheelchair)?: A Little Help needed standing up from a chair using  your arms (e.g., wheelchair or bedside chair)?: A Little Help needed to walk in hospital room?: A Little Help needed climbing 3-5 steps with a railing? : A Little 6 Click Score: 20    End of Session Equipment Utilized During Treatment: Gait belt Activity Tolerance: Patient tolerated treatment well Patient left: (continuing session with OT)   PT Visit Diagnosis: Muscle weakness (generalized) (M62.81);Unsteadiness on feet (R26.81)     Time: 5093-2671 PT Time Calculation (min) (ACUTE ONLY): 8 min  Charges:                           [name], PT Acute [facility] Pager: [phone] Office: [phone]

## 2019-05-19 NOTE — Progress Notes (Signed)
PHARMACY NOTE -  Antibiotic dosing  Pharmacy has been assisting with dosing of cefazolin for cervical osteo/abscess; r/o endocarditis  Dosage remains stable at 2g IV q8 hr and need for further dosage adjustment appears unlikely at present given SCr stable and at baseline  Pharmacy will sign off, following peripherally for culture results or dose adjustments. Please reconsult if a change in clinical status warrants re-evaluation of dosage.  Reuel Boom, PharmD, BCPS [phone] 05/19/2019, 2:35 PM

## 2019-05-19 NOTE — Progress Notes (Signed)
[facility] to pick up pt at 1100 on 05/20/19 to transport to [facility] for TEE

## 2019-05-19 NOTE — Progress Notes (Signed)
    [facility] has been requested to perform a transesophageal echocardiogram on 05/20/2019 for bacteremia.  After careful review of history and examination, the risks and benefits of transesophageal echocardiogram have been explained including risks of esophageal damage, perforation (1:10,000 risk), bleeding, pharyngeal hematoma as well as other potential complications associated with conscious sedation including aspiration, arrhythmia, respiratory failure and death. Alternatives to treatment were discussed, questions were answered. Patient is willing to proceed.   TEE scheduled for 05/20/2019 with Dr. Debara Pickett. Timing TBD  Roby Lofts, PA-C 05/19/2019 9:00 AM

## 2019-05-20 ENCOUNTER — Encounter (HOSPITAL_COMMUNITY)
Admission: EM | Disposition: A | Discharge status: Home / Self Care | Payer: Self-pay | Source: Home / Self Care | Attending: Internal Medicine

## 2019-05-20 ENCOUNTER — Inpatient Hospital Stay (HOSPITAL_COMMUNITY): Payer: Self-pay

## 2019-05-20 ENCOUNTER — Encounter (HOSPITAL_COMMUNITY): Payer: Self-pay | Admitting: *Deleted

## 2019-05-20 DIAGNOSIS — R7881 Bacteremia: Secondary | ICD-10-CM

## 2019-05-20 HISTORY — PX: TEE WITHOUT CARDIOVERSION: SHX5443

## 2019-05-20 LAB — GLUCOSE, CAPILLARY
Glucose-Capillary: 96 mg/dL (ref 70–99)
Glucose-Capillary: 48 mg/dL — ABNORMAL LOW (ref 70–99)
Glucose-Capillary: 95 mg/dL (ref 70–99)
Glucose-Capillary: 358 mg/dL — ABNORMAL HIGH (ref 70–99)

## 2019-05-20 LAB — CULTURE, BLOOD (ROUTINE X 2)
Culture: NO GROWTH
Culture: NO GROWTH
Special Requests: ADEQUATE
Special Requests: ADEQUATE

## 2019-05-20 SURGERY — ECHOCARDIOGRAM, TRANSESOPHAGEAL
Anesthesia: Moderate Sedation

## 2019-05-20 MED ORDER — FENTANYL CITRATE (PF) 100 MCG/2ML IJ SOLN
INTRAMUSCULAR | Status: DC | PRN
  Administered 2019-05-20 (×2): 25 ug via INTRAVENOUS

## 2019-05-20 MED ORDER — LIDOCAINE VISCOUS HCL 2 % MT SOLN
OROMUCOSAL | Status: DC | PRN
  Administered 2019-05-20: 15 mL via OROMUCOSAL

## 2019-05-20 MED ORDER — FENTANYL CITRATE (PF) 100 MCG/2ML IJ SOLN
INTRAMUSCULAR | Status: AC
  Filled 2019-05-20: qty 4

## 2019-05-20 MED ORDER — MIDAZOLAM HCL (PF) 5 MG/ML IJ SOLN
INTRAMUSCULAR | Status: AC
  Filled 2019-05-20: qty 2

## 2019-05-20 MED ORDER — LIDOCAINE VISCOUS HCL 2 % MT SOLN
OROMUCOSAL | Status: AC
  Filled 2019-05-20: qty 15

## 2019-05-20 MED ORDER — BUTAMBEN-TETRACAINE-BENZOCAINE 2-2-14 % EX AERO
INHALATION_SPRAY | CUTANEOUS | Status: DC | PRN
  Administered 2019-05-20: 13:00:00 2 via TOPICAL

## 2019-05-20 MED ORDER — MIDAZOLAM HCL (PF) 10 MG/2ML IJ SOLN
INTRAMUSCULAR | Status: DC | PRN
  Administered 2019-05-20: 1 mg via INTRAVENOUS
  Administered 2019-05-20 (×2): 2 mg via INTRAVENOUS

## 2019-05-20 NOTE — Progress Notes (Signed)
Echocardiogram Echocardiogram Transesophageal has been performed.  Oneal Deputy Chayse Gracey 05/20/2019, 1:50 PM

## 2019-05-20 NOTE — Progress Notes (Signed)
PROGRESS NOTE    Rodney Barber  QAS:[id number] DOB: [birth year] DOA: 05/13/2019 PCP: [name], NP    Brief Narrative:  53 year old male with history of insulin-dependent diabetes, hypertension, chronic pancreatitis, smoker, recent prolonged admission in the hospital complicated with initial suicidal ideation later cleared, deconditioning, now homeless presents to the emergency room with extreme weakness and leg cramps, EMS found with blood sugars of 500.  In the emergency room blood pressures elevated.  Potassium less than 2.  EKG with U waves.  Patient admitted to the hospital with hyperglycemia and severe hypokalemia as well UTI like presentation. Patient stated that he is out of insulin for more than a week.  Patient had diarrhea secondary to taking lactulose at home for high ammonia.    Assessment & Plan:   Principal Problem:   Hypokalemia Active Problems:   Uncontrolled type 2 diabetes mellitus (HCC)   UTI (urinary tract infection)   Hyperglycemia   Tobacco use   Abnormal blood electrolyte level  Severe hypokalemia:  Due to ongoing GI loss in the setting of diarrhea from lactulose use. Potassium was less than 2.  Aggressively replaced and normalizing.   Hypophosphatemia: Severe.  Less than 1 on presentation. Aggressively replaced and normalized.  MSSA UTI , MSSA left neck abscess and C-spine osteomyelitis:  Urine culture with MSSA, left neck aspirate with MSSA, blood cultures drawn after 2 days on Rocephin, no growth so far. Clinically stabilizing.  Remains on cefazolin.  For TEE today. May need 6 weeks of IV antibiotics.  Hyperglycemia, uncontrolled insulin-dependent diabetes: Last A1c was 17.7.  Patient is not using any insulin at home.  Patient was on escalating dose of insulin, had a hypoglycemic event on 05/16/2019 morning, discontinued long-acting insulin. Blood sugars more than 200. Now on lower doses of lantus, continue sliding scale insulin.  Hypertension:  On amlodipine continue.  Depression: On Lexapro, continue.  Smoker: Counseled to quit.  Has nicotine patch.  Homelessness: Case management to help with medication and probably transition to shelter once medically stable. Anticipating patient may need prolonged hospitalization for intravenous antibiotics.   DVT prophylaxis: Lovenox subcu Code Status: DNR,  Family Communication: None Disposition Plan: Unknown at this time.   Consultants:   Infectious disease  Procedures:   Left neck abscess aspiration  Antimicrobials:   Rocephin, 05/13/2019----05/15/2019  Vancomycin, 05/15/2019----05/17/2019  Ancef, 05/17/2019----    Subjective: Patient seen and examined.  No overnight events.  Denies any nausea or vomiting. Patient was seen and examined before he went for TEE procedure.  He denies any complaints.  Mild neck pain persists.  No trouble urinating.  Objective: Vitals:   05/20/19 1340 05/20/19 1350 05/20/19 1400 05/20/19 1410  BP: 108/63 101/61 (!) 91/55 (!) 93/56  Pulse: 65 65 60 (!) 59  Resp: 13 12 12 12   Temp: 98.1 F (36.7 C)     TempSrc: Temporal     SpO2: 100% 100% 100% 99%  Weight:      Height:        Intake/Output Summary (Last 24 hours) at 05/20/2019 1421 Last data filed at 05/19/2019 1754 Gross per 24 hour  Intake 800 ml  Output -  Net 800 ml   Filed Weights   05/14/19 0240 05/20/19 1221  Weight: 46.4 kg 46.4 kg    Examination:  General exam: Appears calm and comfortable, chronically sick looking.  Not in any distress.  On room air. Respiratory system: Clear to auscultation. Respiratory effort normal. Cardiovascular system: S1 & S2 heard, RRR.  No JVD, murmurs, rubs, gallops or clicks. No pedal edema. Gastrointestinal system: Abdomen is nondistended, soft and nontender. No organomegaly or masses felt. Normal bowel sounds heard. Central nervous system: Alert and oriented. No focal neurological deficits. Extremities: Symmetric 5 x 5 power. Skin: No  rashes, lesions or ulcers Psychiatry: Judgement and insight appear normal. Mood & affect appropriate.    Data Reviewed: I have personally reviewed following labs and imaging studies  CBC: Recent Labs  Lab 05/13/19 2035 05/14/19 0438 05/16/19 0432 05/17/19 0531 05/19/19 0454  WBC 10.8* 11.2* 12.1* 8.0 7.0  NEUTROABS 8.2*  --  8.5* 5.2 4.5  HGB 11.3* 11.6* 11.9* 10.6* 9.8*  HCT 33.4* 35.6* 37.5* 33.7* 30.9*  MCV 91.0 92.5 95.2 94.9 94.2  PLT 280 288 281 252 831   Basic Metabolic Panel: Recent Labs  Lab 05/14/19 1151 05/15/19 0438 05/16/19 0432 05/17/19 0531 05/19/19 0454  NA 139 138 137 133* 136  K 2.9* 3.9 5.5* 5.1 4.3  CL 106 102 102 100 99  CO2 26 27 25 26 29   GLUCOSE 113* 142* 43* 181* 169*  BUN 8 11 12 11 16   CREATININE 0.60* 0.70 0.63 0.63 0.63  CALCIUM 7.5* 7.7* 8.3* 8.1* 8.4*  MG 1.8 1.8 1.9 2.0 1.8  PHOS <1.0* 1.6* 2.3* 3.6 3.8   GFR: Estimated Creatinine Clearance: 69.3 mL/min (by C-G formula based on SCr of 0.63 mg/dL). Liver Function Tests: Recent Labs  Lab 05/14/19 1151 05/17/19 0531 05/19/19 0454  AST 37  --   --   ALT 18  --   --   ALKPHOS 218*  --   --   BILITOT 0.7  --   --   PROT 5.2*  --   --   ALBUMIN 2.5* 2.2* 2.3*   No results for input(s): LIPASE, AMYLASE in the last 168 hours. No results for input(s): AMMONIA in the last 168 hours. Coagulation Profile: Recent Labs  Lab 05/16/19 0432  INR 0.8   Cardiac Enzymes: No results for input(s): CKTOTAL, CKMB, CKMBINDEX, TROPONINI in the last 168 hours. BNP (last 3 results) No results for input(s): PROBNP in the last 8760 hours. HbA1C: No results for input(s): HGBA1C in the last 72 hours. CBG: Recent Labs  Lab 05/19/19 0757 05/19/19 1222 05/19/19 1620 05/19/19 2215 05/20/19 0753  GLUCAP 222* 310* 261* 343* 96   Lipid Profile: No results for input(s): CHOL, HDL, LDLCALC, TRIG, CHOLHDL, LDLDIRECT in the last 72 hours. Thyroid Function Tests: No results for input(s): TSH,  T4TOTAL, FREET4, T3FREE, THYROIDAB in the last 72 hours. Anemia Panel: No results for input(s): VITAMINB12, FOLATE, FERRITIN, TIBC, IRON, RETICCTPCT in the last 72 hours. Sepsis Labs: No results for input(s): PROCALCITON, LATICACIDVEN in the last 168 hours.  Recent Results (from the past 240 hour(s))  Urine culture     Status: Abnormal   Collection Time: 05/13/19  9:51 PM   Specimen: Urine, Random  Result Value Ref Range Status   Specimen Description   Final    URINE, RANDOM Performed at [facility] [address], [place], [place] [address]    Special Requests   Final    NONE Performed at [facility], [place] [address], [place], [place] [address]    Culture >=100,000 COLONIES/mL STAPHYLOCOCCUS AUREUS (A)  Final   Report Status 05/16/2019 FINAL  Final   Organism ID, Bacteria STAPHYLOCOCCUS AUREUS (A)  Final      Susceptibility   Staphylococcus aureus - MIC*    CIPROFLOXACIN <=0.5  SENSITIVE Sensitive     GENTAMICIN <=0.5 SENSITIVE Sensitive     NITROFURANTOIN <=16 SENSITIVE Sensitive     OXACILLIN 0.5 SENSITIVE Sensitive     TETRACYCLINE <=1 SENSITIVE Sensitive     VANCOMYCIN 1 SENSITIVE Sensitive     TRIMETH/SULFA <=10 SENSITIVE Sensitive     CLINDAMYCIN <=0.25 SENSITIVE Sensitive     RIFAMPIN <=0.5 SENSITIVE Sensitive     Inducible Clindamycin NEGATIVE Sensitive     * >=100,000 COLONIES/mL STAPHYLOCOCCUS AUREUS  SARS CORONAVIRUS 2 (TAT 6-24 HRS) Nasopharyngeal Nasopharyngeal Swab     Status: None   Collection Time: 05/13/19 11:37 PM   Specimen: Nasopharyngeal Swab  Result Value Ref Range Status   SARS Coronavirus 2 NEGATIVE NEGATIVE Final    Comment: (NOTE) SARS-CoV-2 target nucleic acids are NOT DETECTED. The SARS-CoV-2 RNA is generally detectable in upper and lower respiratory specimens during the acute phase of infection. Negative results do not preclude SARS-CoV-2 infection, do not rule out co-infections with other pathogens, and  should not be used as the sole basis for treatment or other patient management decisions. Negative results must be combined with clinical observations, patient history, and epidemiological information. The expected result is Negative. Fact Sheet for Patients: HairSlick.nohttps://www.fda.gov/media/138098/download Fact Sheet for Healthcare Providers: quierodirigir.comhttps://www.fda.gov/media/138095/download This test is not yet approved or cleared by the [place] FDA and  has been authorized for detection and/or diagnosis of SARS-CoV-2 by FDA under an Emergency Use Authorization (EUA). This EUA will remain  in effect (meaning this test can be used) for the duration of the COVID-19 declaration under Section 56 4(b)(1) of the Act, 21 U.S.C. section 360bbb-3(b)(1), unless the authorization is terminated or revoked sooner. Performed at [facility], [address] [place], [place] [address]   Culture, blood (routine x 2)     Status: None (Preliminary result)   Collection Time: 05/15/19 12:19 PM   Specimen: BLOOD LEFT HAND  Result Value Ref Range Status   Specimen Description   Final    BLOOD LEFT HAND Performed at [facility], [address] [place], [place] [address]    Special Requests   Final    BOTTLES DRAWN AEROBIC AND ANAEROBIC Blood Culture adequate volume Performed at [facility], [address] [place], [place] [address]    Culture   Final    NO GROWTH 4 DAYS Performed at [facility], [address] [place], [place] [address]    Report Status PENDING  Incomplete  Culture, blood (routine x 2)     Status: None (Preliminary result)   Collection Time: 05/15/19 12:25 PM   Specimen: BLOOD RIGHT ARM  Result Value Ref Range Status   Specimen Description   Final    BLOOD RIGHT ARM Performed at [facility], [address] [place], [place] [address]    Special Requests   Final    BOTTLES DRAWN AEROBIC ONLY Blood Culture  adequate volume Performed at [facility], [address] [place], [place] [address]    Culture   Final    NO GROWTH 4 DAYS Performed at [facility], [address] [place], [place] [address]    Report Status PENDING  Incomplete  Aerobic/Anaerobic Culture (surgical/deep wound)     Status: None (Preliminary result)   Collection Time: 05/16/19  4:45 PM   Specimen: Abscess  Result Value Ref Range Status   Specimen Description   Final    ABSCESS LEFT NECK Performed  at [facility], [address] [place], [place] [address]    Special Requests   Final    NONE Performed at [facility], [address], [place], [place] [address]    Gram Stain   Final    ABUNDANT WBC PRESENT, PREDOMINANTLY PMN MODERATE GRAM POSITIVE COCCI IN CLUSTERS Performed at [facility], [address], [place], [place] [address]    Culture   Final    ABUNDANT STAPHYLOCOCCUS AUREUS NO ANAEROBES ISOLATED; CULTURE IN PROGRESS FOR 5 DAYS    Report Status PENDING  Incomplete   Organism ID, Bacteria STAPHYLOCOCCUS AUREUS  Final      Susceptibility   Staphylococcus aureus - MIC*    CIPROFLOXACIN <=0.5 SENSITIVE Sensitive     ERYTHROMYCIN <=0.25 SENSITIVE Sensitive     GENTAMICIN <=0.5 SENSITIVE Sensitive     OXACILLIN 0.5 SENSITIVE Sensitive     TETRACYCLINE <=1 SENSITIVE Sensitive     VANCOMYCIN <=0.5 SENSITIVE Sensitive     TRIMETH/SULFA <=10 SENSITIVE Sensitive     CLINDAMYCIN <=0.25 SENSITIVE Sensitive     RIFAMPIN <=0.5 SENSITIVE Sensitive     Inducible Clindamycin NEGATIVE Sensitive     * ABUNDANT STAPHYLOCOCCUS AUREUS         Radiology Studies: No results found.      Scheduled Meds: . [MAR Hold] amLODipine  5 mg Oral Daily  . [MAR Hold] atorvastatin  20 mg Oral q1800  . [MAR Hold] escitalopram  5 mg Oral Daily  . [MAR Hold] feeding supplement  1 Container Oral Q24H  . [MAR Hold] feeding supplement (ENSURE ENLIVE)   237 mL Oral BID BM  . [MAR Hold] feeding supplement (PRO-STAT SUGAR FREE 64)  30 mL Oral Daily  . [MAR Hold] folic acid  1 mg Oral Daily  . influenza vac split quadrivalent PF  0.5 mL Intramuscular Tomorrow-1000  . [MAR Hold] insulin aspart  0-5 Units Subcutaneous QHS  . [MAR Hold] insulin aspart  0-9 Units Subcutaneous TID WC  . [MAR Hold] insulin glargine  10 Units Subcutaneous QHS  . [MAR Hold] lipase/protease/amylase  12,000 Units Oral TID WC  . [MAR Hold] lisinopril  10 mg Oral Daily  . [MAR Hold] magnesium oxide  400 mg Oral BID  . [MAR Hold] multivitamin with minerals  1 tablet Oral Daily  . [MAR Hold] nicotine  14 mg Transdermal Daily  . [MAR Hold] thiamine  100 mg Oral Daily   Continuous Infusions: . sodium chloride    . [MAR Hold]  ceFAZolin (ANCEF) IV 2 g (05/20/19 0636)     LOS: 6 days    Time spent: 30 minutes    Dorcas Carrow, MD [facility] Pager [phone]  If 7PM-7AM, please contact night-coverage [email or web address] Password [email or web address] 05/20/2019, 2:21 PM

## 2019-05-20 NOTE — Progress Notes (Signed)
Patient currently at [facility] for echo.  No vegetation on TEE. It will be ideal to keep him on IV cefazolin for at least 4 weeks, which will have to be in house and then we can continue with either oral therapy or delbavancin infusions.   I will follow up tomorrow.  Thayer Headings, MD

## 2019-05-20 NOTE — Progress Notes (Signed)
Patient returned from TEE at [facility]. Awake but "sleepy". CBG checked and was 48. Pt given 3 OJs and graham crackers and CBG up to 95. Was otherwise asymptomatic. Continue to monitor. Eulas Post, RN

## 2019-05-20 NOTE — CV Procedure (Signed)
TRANSESOPHAGEAL ECHOCARDIOGRAM (TEE) NOTE  INDICATIONS: infective endocarditis  PROCEDURE:   Informed consent was obtained prior to the procedure. The risks, benefits and alternatives for the procedure were discussed and the patient comprehended these risks.  Risks include, but are not limited to, cough, sore throat, vomiting, nausea, somnolence, esophageal and stomach trauma or perforation, bleeding, low blood pressure, aspiration, pneumonia, infection, trauma to the teeth and death.    After a procedural time-out, the patient was given 5 mg versed and 50 mcg fentanyl for moderate sedation.  The patient's heart rate, blood pressure, and oxygen saturation are monitored continuously during the procedure.The oropharynx was anesthetized 10 cc of topical 1% viscous lidocaine.  The transesophageal probe was inserted in the esophagus and stomach without difficulty and multiple views were obtained.  The patient was kept under observation until the patient left the procedure room.  The period of conscious sedation is 19 minutes, of which I was present face-to-face 100% of this time. The patient left the procedure room in stable condition.   Agitated microbubble saline contrast was not administered.  COMPLICATIONS:    There were no immediate complications.  Findings:  1. LEFT VENTRICLE: The left ventricular wall thickness is moderately increased.  The left ventricular cavity is normal in size. Wall motion is normal.  LVEF is 55-60%.  2. RIGHT VENTRICLE:  The right ventricle is normal in structure and function without any thrombus or masses.    3. LEFT ATRIUM:  The left atrium is normal in size without any thrombus or masses.  There is not spontaneous echo contrast ("smoke") in the left atrium consistent with a low flow state.  4. LEFT ATRIAL APPENDAGE:  The left atrial appendage is free of any thrombus or masses. The appendage has single lobes. Pulse doppler indicates moderate flow in the  appendage.  5. ATRIAL SEPTUM:  The atrial septum appears intact and is free of thrombus and/or masses.  There is no evidence for interatrial shunting by color doppler.  6. RIGHT ATRIUM:  The right atrium is normal in size and function without any thrombus or masses.  7. MITRAL VALVE:  The mitral valve is normal in structure and function with trivial regurgitation.  There were no vegetations or stenosis.  8. AORTIC VALVE:  The aortic valve is trileaflet, normal in structure and function with no regurgitation.  There were no vegetations or stenosis  9. TRICUSPID VALVE:  The tricuspid valve is normal in structure and function with trivial regurgitation.  There were no vegetations or stenosis  10.  PULMONIC VALVE:  The pulmonic valve is normal in structure and function with no regurgitation.  There were no vegetations or stenosis. The pulmonary artery is dilated to 3.0 cm.   11. AORTIC ARCH, ASCENDING AND DESCENDING AORTA:  There was no Ron Parker et. Al, 1992) atherosclerosis of the ascending aorta, aortic arch, or proximal descending aorta.  12. PULMONARY VEINS: Anomalous pulmonary venous return was not noted.  13. PERICARDIUM: The pericardium appeared normal and non-thickened.  There is no pericardial effusion.  14. TRANSVERSE SINUS: Prominent echodense mass appears to be in the transverse sinus / para-aortic area - likely fat pad.  IMPRESSION:   1. No valvular endocarditis 2. Dilated PA to 3.0 cm 3. No LAA thrombus 4. Prominent echogenic mass noted between the aorta and PA, likely fat in the transverse sinus 5. LVEF 55-60%  RECOMMENDATIONS:    1.  Antibiotics per ID recommendations.  Time Spent Directly with the Patient:  45 minutes  Chrystie Nose, MD, [facility], FACP  [place]  [facility]  Medical Director of the Advanced Lipid Disorders &  Cardiovascular Risk Reduction Clinic Diplomate of the American Board of Clinical Lipidology Attending Cardiologist  Direct Dial:  [phone]  Fax: [phone]  Website:  [email or web address] 05/20/2019, 1:36 PM

## 2019-05-20 NOTE — H&P (Signed)
   INTERVAL PROCEDURE H&P  History and Physical Interval Note:  05/20/2019 12:02 PM  Rodney Barber has presented today for their planned procedure. The various methods of treatment have been discussed with the patient and family. After consideration of risks, benefits and other options for treatment, the patient has consented to the procedure.  The patients' outpatient history has been reviewed, patient examined, and no change in status from most recent office note within the past 30 days. I have reviewed the patients' chart and labs and will proceed as planned. Questions were answered to the patient's satisfaction.   Rodney Casino, MD, [facility], [place] Director of the Advanced Lipid Disorders &  Cardiovascular Risk Reduction Clinic Diplomate of the American Board of Clinical Lipidology Attending Cardiologist  Direct Dial: [phone]  Fax: [phone]  Website:  [email or web address] 05/20/2019, 12:02 PM

## 2019-05-21 LAB — GLUCOSE, CAPILLARY
Glucose-Capillary: 104 mg/dL — ABNORMAL HIGH (ref 70–99)
Glucose-Capillary: 418 mg/dL — ABNORMAL HIGH (ref 70–99)
Glucose-Capillary: 354 mg/dL — ABNORMAL HIGH (ref 70–99)
Glucose-Capillary: 241 mg/dL — ABNORMAL HIGH (ref 70–99)

## 2019-05-21 LAB — AEROBIC/ANAEROBIC CULTURE W GRAM STAIN (SURGICAL/DEEP WOUND)

## 2019-05-21 MED ORDER — PRO-STAT SUGAR FREE PO LIQD
30.0000 mL | Freq: Two times a day (BID) | ORAL | Status: DC
  Administered 2019-05-22 – 2019-06-25 (×68): 30 mL via ORAL
  Filled 2019-05-21 (×67): qty 30

## 2019-05-21 NOTE — Progress Notes (Signed)
PROGRESS NOTE    Rodney MayaDavid Mcquain  ZOX:[id number] DOB: [birth year] DOA: 05/13/2019 PCP: Claiborne RiggFleming, Zelda W, NP    Brief Narrative:  53 year old male with history of insulin-dependent diabetes, hypertension, chronic pancreatitis, smoker, recent prolonged admission in the hospital complicated with initial suicidal ideation later cleared, deconditioning, now homeless presents to the emergency room with extreme weakness and leg cramps, EMS found with blood sugars of 500.  In the emergency room blood pressures elevated.  Potassium less than 2.  EKG with U waves.  Patient admitted to the hospital with hyperglycemia and severe hypokalemia as well UTI like presentation. Patient stated that he is out of insulin for more than a week.  Patient had diarrhea secondary to taking lactulose at home for high ammonia.    Assessment & Plan:   Principal Problem:   Hypokalemia Active Problems:   Uncontrolled type 2 diabetes mellitus (HCC)   UTI (urinary tract infection)   Hyperglycemia   Tobacco use   Abnormal blood electrolyte level  Severe hypokalemia:  Due to ongoing GI loss in the setting of diarrhea from lactulose use. Potassium was less than 2.  Aggressively replaced and normalizing.   Hypophosphatemia: Severe.  Less than 1 on presentation. Aggressively replaced and normalized.  MSSA UTI , MSSA left neck abscess and C-spine osteomyelitis:  Urine culture with MSSA, left neck aspirate with MSSA, blood cultures drawn after 2 days on Rocephin, no growth so far. Clinically stabilizing.  Remains on cefazolin.   TEE with no vegetation.  Plan for 6 weeks of IV antibiotics.  Hyperglycemia, uncontrolled insulin-dependent diabetes: Last A1c was 17.7.  Patient is not using any insulin at home.  Patient was on escalating dose of insulin, had a hypoglycemic event on 05/16/2019 morning, Now on lower doses of lantus, continue sliding scale insulin.  Hypertension: On amlodipine continue.  Depression: On Lexapro,  continue.  Smoker: Counseled to quit.  Has nicotine patch.  Homelessness: Case management to help with medication and probably transition to shelter once medically stable. Anticipating patient may need prolonged hospitalization for intravenous antibiotics.   DVT prophylaxis: Lovenox subcu Code Status: DNR,  Family Communication: None Disposition Plan: Unknown at this time.   Consultants:   Infectious disease  Procedures:   Left neck abscess aspiration  Antimicrobials:   Rocephin, 05/13/2019----05/15/2019  Vancomycin, 05/15/2019----05/17/2019  Ancef, 05/17/2019----    Subjective: Patient seen and examined.  No overnight events.  Denies any nausea or vomiting. Hypoglycemic events after prolonged n.p.o. yesterday.  Now blood sugars more than 400.  Objective: Vitals:   05/20/19 1440 05/20/19 1545 05/20/19 2148 05/21/19 0555  BP: 138/71 139/80 (!) 145/82 132/74  Pulse: 61 60 71 72  Resp: 17  16 18   Temp:  (!) 97.5 F (36.4 C) 98 F (36.7 C) 98.5 F (36.9 C)  TempSrc:  Oral  Oral  SpO2: 99% 100% 100% 100%  Weight:      Height:        Intake/Output Summary (Last 24 hours) at 05/21/2019 1245 Last data filed at 05/21/2019 0854 Gross per 24 hour  Intake 240 ml  Output -  Net 240 ml   Filed Weights   05/14/19 0240 05/20/19 1221  Weight: 46.4 kg 46.4 kg    Examination:  General exam: Appears calm and comfortable.  Not in any distress.  On room air. Respiratory system: Clear to auscultation. Respiratory effort normal. Cardiovascular system: S1 & S2 heard, RRR. No JVD, murmurs, rubs, gallops or clicks. No pedal edema. Gastrointestinal system: Abdomen is nondistended,  soft and nontender. No organomegaly or masses felt. Normal bowel sounds heard. Central nervous system: Alert and oriented. No focal neurological deficits. Extremities: Symmetric 5 x 5 power. Skin: No rashes, lesions or ulcers Psychiatry: Judgement and insight appear normal. Mood & affect  appropriate.    Data Reviewed: I have personally reviewed following labs and imaging studies  CBC: Recent Labs  Lab 05/16/19 0432 05/17/19 0531 05/19/19 0454  WBC 12.1* 8.0 7.0  NEUTROABS 8.5* 5.2 4.5  HGB 11.9* 10.6* 9.8*  HCT 37.5* 33.7* 30.9*  MCV 95.2 94.9 94.2  PLT 281 252 306   Basic Metabolic Panel: Recent Labs  Lab 05/15/19 0438 05/16/19 0432 05/17/19 0531 05/19/19 0454  NA 138 137 133* 136  K 3.9 5.5* 5.1 4.3  CL 102 102 100 99  CO2 27 25 26 29   GLUCOSE 142* 43* 181* 169*  BUN 11 12 11 16   CREATININE 0.70 0.63 0.63 0.63  CALCIUM 7.7* 8.3* 8.1* 8.4*  MG 1.8 1.9 2.0 1.8  PHOS 1.6* 2.3* 3.6 3.8   GFR: Estimated Creatinine Clearance: 69.3 mL/min (by C-G formula based on SCr of 0.63 mg/dL). Liver Function Tests: Recent Labs  Lab 05/17/19 0531 05/19/19 0454  ALBUMIN 2.2* 2.3*   No results for input(s): LIPASE, AMYLASE in the last 168 hours. No results for input(s): AMMONIA in the last 168 hours. Coagulation Profile: Recent Labs  Lab 05/16/19 0432  INR 0.8   Cardiac Enzymes: No results for input(s): CKTOTAL, CKMB, CKMBINDEX, TROPONINI in the last 168 hours. BNP (last 3 results) No results for input(s): PROBNP in the last 8760 hours. HbA1C: No results for input(s): HGBA1C in the last 72 hours. CBG: Recent Labs  Lab 05/20/19 1617 05/20/19 1652 05/20/19 2145 05/21/19 0813 05/21/19 1242  GLUCAP 48* 95 358* 104* 418*   Lipid Profile: No results for input(s): CHOL, HDL, LDLCALC, TRIG, CHOLHDL, LDLDIRECT in the last 72 hours. Thyroid Function Tests: No results for input(s): TSH, T4TOTAL, FREET4, T3FREE, THYROIDAB in the last 72 hours. Anemia Panel: No results for input(s): VITAMINB12, FOLATE, FERRITIN, TIBC, IRON, RETICCTPCT in the last 72 hours. Sepsis Labs: No results for input(s): PROCALCITON, LATICACIDVEN in the last 168 hours.  Recent Results (from the past 240 hour(s))  Urine culture     Status: Abnormal   Collection Time: 05/13/19   9:51 PM   Specimen: Urine, Random  Result Value Ref Range Status   Specimen Description   Final    URINE, RANDOM Performed at [facility], [address], [place], [place]    Special Requests   Final    NONE Performed at [facility], [address], [place], [place]    Culture >=100,000 COLONIES/mL STAPHYLOCOCCUS AUREUS (A)  Final   Report Status 05/16/2019 FINAL  Final   Organism ID, Bacteria STAPHYLOCOCCUS AUREUS (A)  Final      Susceptibility   Staphylococcus aureus - MIC*    CIPROFLOXACIN <=0.5 SENSITIVE Sensitive     GENTAMICIN <=0.5 SENSITIVE Sensitive     NITROFURANTOIN <=16 SENSITIVE Sensitive     OXACILLIN 0.5 SENSITIVE Sensitive     TETRACYCLINE <=1 SENSITIVE Sensitive     VANCOMYCIN 1 SENSITIVE Sensitive     TRIMETH/SULFA <=10 SENSITIVE Sensitive     CLINDAMYCIN <=0.25 SENSITIVE Sensitive     RIFAMPIN <=0.5 SENSITIVE Sensitive     Inducible Clindamycin NEGATIVE Sensitive     * >=100,000 COLONIES/mL STAPHYLOCOCCUS AUREUS  SARS CORONAVIRUS 2 (TAT 6-24 HRS) Nasopharyngeal Nasopharyngeal Swab  Status: None   Collection Time: 05/13/19 11:37 PM   Specimen: Nasopharyngeal Swab  Result Value Ref Range Status   SARS Coronavirus 2 NEGATIVE NEGATIVE Final    Comment: (NOTE) SARS-CoV-2 target nucleic acids are NOT DETECTED. The SARS-CoV-2 RNA is generally detectable in upper and lower respiratory specimens during the acute phase of infection. Negative results do not preclude SARS-CoV-2 infection, do not rule out co-infections with other pathogens, and should not be used as the sole basis for treatment or other patient management decisions. Negative results must be combined with clinical observations, patient history, and epidemiological information. The expected result is Negative. Fact Sheet for Patients: HairSlick.no Fact Sheet for Healthcare Providers:  quierodirigir.com This test is not yet approved or cleared by the [place] FDA and  has been authorized for detection and/or diagnosis of SARS-CoV-2 by FDA under an Emergency Use Authorization (EUA). This EUA will remain  in effect (meaning this test can be used) for the duration of the COVID-19 declaration under Section 56 4(b)(1) of the Act, 21 U.S.C. section 360bbb-3(b)(1), unless the authorization is terminated or revoked sooner. Performed at [facility], [address] [place], [place] [address]   Culture, blood (routine x 2)     Status: None   Collection Time: 05/15/19 12:19 PM   Specimen: BLOOD LEFT HAND  Result Value Ref Range Status   Specimen Description   Final    BLOOD LEFT HAND Performed at [facility], [address], [place], [place] [address]    Special Requests   Final    BOTTLES DRAWN AEROBIC AND ANAEROBIC Blood Culture adequate volume Performed at [facility], [address], [place], [place] [address]    Culture   Final    NO GROWTH 5 DAYS Performed at [facility], [address], [place], [place] [address]    Report Status 05/20/2019 FINAL  Final  Culture, blood (routine x 2)     Status: None   Collection Time: 05/15/19 12:25 PM   Specimen: BLOOD RIGHT ARM  Result Value Ref Range Status   Specimen Description   Final    BLOOD RIGHT ARM Performed at [facility], [address], [place], [place] [address]    Special Requests   Final    BOTTLES DRAWN AEROBIC ONLY Blood Culture adequate volume Performed at [facility], [address] [place], [place] [address]    Culture   Final    NO GROWTH 5 DAYS Performed at [facility], [address], [place], [place] [address]    Report Status 05/20/2019 FINAL  Final  Aerobic/Anaerobic Culture (surgical/deep wound)     Status: None (Preliminary result)   Collection Time: 05/16/19   4:45 PM   Specimen: Abscess  Result Value Ref Range Status   Specimen Description   Final    ABSCESS LEFT NECK Performed at [facility], [address] [place], [place] [address]    Special Requests   Final    NONE Performed at [facility], [address] [place], [place] [address]    Gram Stain   Final    ABUNDANT WBC PRESENT, PREDOMINANTLY PMN MODERATE GRAM POSITIVE COCCI IN CLUSTERS Performed at [facility], [address] [place], [place] [address]    Culture   Final    ABUNDANT STAPHYLOCOCCUS AUREUS NO ANAEROBES ISOLATED; CULTURE IN PROGRESS FOR 5 DAYS    Report Status  PENDING  Incomplete   Organism ID, Bacteria STAPHYLOCOCCUS AUREUS  Final      Susceptibility   Staphylococcus aureus - MIC*    CIPROFLOXACIN <=0.5 SENSITIVE Sensitive     ERYTHROMYCIN <=0.25 SENSITIVE Sensitive     GENTAMICIN <=0.5 SENSITIVE Sensitive     OXACILLIN 0.5 SENSITIVE Sensitive     TETRACYCLINE <=1 SENSITIVE Sensitive     VANCOMYCIN <=0.5 SENSITIVE Sensitive     TRIMETH/SULFA <=10 SENSITIVE Sensitive     CLINDAMYCIN <=0.25 SENSITIVE Sensitive     RIFAMPIN <=0.5 SENSITIVE Sensitive     Inducible Clindamycin NEGATIVE Sensitive     * ABUNDANT STAPHYLOCOCCUS AUREUS         Radiology Studies: No results found.      Scheduled Meds: . amLODipine  5 mg Oral Daily  . atorvastatin  20 mg Oral q1800  . escitalopram  5 mg Oral Daily  . feeding supplement  1 Container Oral Q24H  . feeding supplement (ENSURE ENLIVE)  237 mL Oral BID BM  . feeding supplement (PRO-STAT SUGAR FREE 64)  30 mL Oral Daily  . folic acid  1 mg Oral Daily  . influenza vac split quadrivalent PF  0.5 mL Intramuscular Tomorrow-1000  . insulin aspart  0-5 Units Subcutaneous QHS  . insulin aspart  0-9 Units Subcutaneous TID WC  . insulin glargine  10 Units Subcutaneous QHS  . lipase/protease/amylase  12,000 Units Oral TID WC  . lisinopril  10 mg Oral Daily  . magnesium  oxide  400 mg Oral BID  . multivitamin with minerals  1 tablet Oral Daily  . nicotine  14 mg Transdermal Daily  . thiamine  100 mg Oral Daily   Continuous Infusions: .  ceFAZolin (ANCEF) IV 2 g (05/21/19 1244)     LOS: 7 days    Time spent: 25 minutes    Barb Merino, MD [facility] Pager [phone]  If 7PM-7AM, please contact night-coverage [email or web address] Password [email or web address] 05/21/2019, 12:45 PM

## 2019-05-21 NOTE — Progress Notes (Signed)
[facility]   Reason for visit: Follow up on C3-4 osteomyelitis, septic facet joint with abscess  Interval History: TEE yesterday with no vegetation.  No associated rash or diarrhea. No new complaints.    Physical Exam: Constitutional:  Vitals:   05/21/19 0555 05/21/19 1404  BP: 132/74 114/73  Pulse: 72 93  Resp: 18 16  Temp: 98.5 F (36.9 C) 98 F (36.7 C)  SpO2: 100% 100%   patient appears in NAD Eyes: anicteric HENT: poor dentition  Respiratory: Normal respiratory effort; CTA B Cardiovascular: RRR  Review of Systems: Constitutional: negative for fevers and chills Gastrointestinal: negative for nausea and diarrhea Integument/breast: negative for rash  Lab Results  Component Value Date   WBC 7.0 05/19/2019   HGB 9.8 (L) 05/19/2019   HCT 30.9 (L) 05/19/2019   MCV 94.2 05/19/2019   PLT 306 05/19/2019    Lab Results  Component Value Date   CREATININE 0.63 05/19/2019   BUN 16 05/19/2019   NA 136 05/19/2019   K 4.3 05/19/2019   CL 99 05/19/2019   CO2 29 05/19/2019    Lab Results  Component Value Date   ALT 18 05/14/2019   AST 37 05/14/2019   ALKPHOS 218 (H) 05/14/2019     Microbiology: Recent Results (from the past 240 hour(s))  Urine culture     Status: Abnormal   Collection Time: 05/13/19  9:51 PM   Specimen: Urine, Random  Result Value Ref Range Status   Specimen Description   Final    URINE, RANDOM Performed at [facility], [address] [place], [place] [address]    Special Requests   Final    NONE Performed at [facility], [address], [place], [place] [address]    Culture >=100,000 COLONIES/mL STAPHYLOCOCCUS AUREUS (A)  Final   Report Status 05/16/2019 FINAL  Final   Organism ID, Bacteria STAPHYLOCOCCUS AUREUS (A)  Final      Susceptibility   Staphylococcus aureus - MIC*    CIPROFLOXACIN <=0.5 SENSITIVE Sensitive     GENTAMICIN <=0.5 SENSITIVE Sensitive    NITROFURANTOIN <=16 SENSITIVE Sensitive     OXACILLIN 0.5 SENSITIVE Sensitive     TETRACYCLINE <=1 SENSITIVE Sensitive     VANCOMYCIN 1 SENSITIVE Sensitive     TRIMETH/SULFA <=10 SENSITIVE Sensitive     CLINDAMYCIN <=0.25 SENSITIVE Sensitive     RIFAMPIN <=0.5 SENSITIVE Sensitive     Inducible Clindamycin NEGATIVE Sensitive     * >=100,000 COLONIES/mL STAPHYLOCOCCUS AUREUS  SARS CORONAVIRUS 2 (TAT 6-24 HRS) Nasopharyngeal Nasopharyngeal Swab     Status: None   Collection Time: 05/13/19 11:37 PM   Specimen: Nasopharyngeal Swab  Result Value Ref Range Status   SARS Coronavirus 2 NEGATIVE NEGATIVE Final    Comment: (NOTE) SARS-CoV-2 target nucleic acids are NOT DETECTED. The SARS-CoV-2 RNA is generally detectable in upper and lower respiratory specimens during the acute phase of infection. Negative results do not preclude SARS-CoV-2 infection, do not rule out co-infections with other pathogens, and should not be used as the sole basis for treatment or other patient management decisions. Negative results must be combined with clinical observations, patient history, and epidemiological information. The expected result is Negative. Fact Sheet for Patients: HairSlick.no Fact Sheet for Healthcare Providers: quierodirigir.com This test is not yet approved or cleared by the [place] FDA and  has been authorized for detection and/or diagnosis of SARS-CoV-2 by FDA under an Emergency Use Authorization (EUA). This  EUA will remain  in effect (meaning this test can be used) for the duration of the COVID-19 declaration under Section 56 4(b)(1) of the Act, 21 U.S.C. section 360bbb-3(b)(1), unless the authorization is terminated or revoked sooner. Performed at [facility], [place] [address] [place], [place] [address]   Culture, blood (routine x 2)     Status: None   Collection Time: 05/15/19 12:19 PM   Specimen: BLOOD LEFT HAND   Result Value Ref Range Status   Specimen Description   Final    BLOOD LEFT HAND Performed at [place] [address] [place], [place] [address]    Special Requests   Final    BOTTLES DRAWN AEROBIC AND ANAEROBIC Blood Culture adequate volume Performed at [place] [address] [place], [place] [address]    Culture   Final    NO GROWTH 5 DAYS Performed at [facility], [place] [address], [place], [place] [address]    Report Status 05/20/2019 FINAL  Final  Culture, blood (routine x 2)     Status: None   Collection Time: 05/15/19 12:25 PM   Specimen: BLOOD RIGHT ARM  Result Value Ref Range Status   Specimen Description   Final    BLOOD RIGHT ARM Performed at [place] [address], [place], [place] [address]    Special Requests   Final    BOTTLES DRAWN AEROBIC ONLY Blood Culture adequate volume Performed at [place] [address], [place], [place] [address]    Culture   Final    NO GROWTH 5 DAYS Performed at [facility], [place] [address] [place], [place] [address]    Report Status 05/20/2019 FINAL  Final  Aerobic/Anaerobic Culture (surgical/deep wound)     Status: None   Collection Time: 05/16/19  4:45 PM   Specimen: Abscess  Result Value Ref Range Status   Specimen Description   Final    ABSCESS LEFT NECK Performed at [place] [address] [place], [place] [address]    Special Requests   Final    NONE Performed at [facility], [place] [address], [place], [place] [address]    Gram Stain   Final    ABUNDANT WBC PRESENT, PREDOMINANTLY PMN MODERATE GRAM POSITIVE COCCI IN CLUSTERS    Culture   Final    ABUNDANT STAPHYLOCOCCUS AUREUS NO ANAEROBES ISOLATED Performed at [facility], [place] [address] [place], [place] [address]    Report Status 05/21/2019 FINAL  Final   Organism ID, Bacteria STAPHYLOCOCCUS AUREUS   Final      Susceptibility   Staphylococcus aureus - MIC*    CIPROFLOXACIN <=0.5 SENSITIVE Sensitive     ERYTHROMYCIN <=0.25 SENSITIVE Sensitive     GENTAMICIN <=0.5 SENSITIVE Sensitive     OXACILLIN 0.5 SENSITIVE Sensitive     TETRACYCLINE <=1 SENSITIVE Sensitive     VANCOMYCIN <=0.5 SENSITIVE Sensitive     TRIMETH/SULFA <=10 SENSITIVE Sensitive     CLINDAMYCIN <=0.25 SENSITIVE Sensitive     RIFAMPIN <=0.5 SENSITIVE Sensitive     Inducible Clindamycin NEGATIVE Sensitive     * ABUNDANT STAPHYLOCOCCUS AUREUS    Impression/Plan:  1.Cervical osteomyelitis and abscess with facet septic arthritis - on cefazolin and needs prolonged IV treatment.  Will likely need to be in house. Wil lcontinue to monitor  2.  Medication monitoring - creat remains wnl.

## 2019-05-21 NOTE — Progress Notes (Signed)
Nutrition Follow-up  DOCUMENTATION CODES:   Underweight, Severe malnutrition in context of chronic illness  INTERVENTION:  - will d/c Boost Breeze. - continue Ensure Enlive BID. - will increase prostat from once/day to BID. - continue to encourage PO intakes.    NUTRITION DIAGNOSIS:   Severe Malnutrition related to chronic illness as evidenced by severe fat depletion, severe muscle depletion. -revised  GOAL:   Patient will meet greater than or equal to 90% of their needs -met on average   MONITOR:   PO intake, Supplement acceptance, Labs, Weight trends  ASSESSMENT:   53 year old male with history of insulin-dependent DM, HTN, chronic pancreatitis, smoker, and recent prolonged hospitalization complicated by suicidal ideation later cleared, deconditioning, now homeless. He presented to the ED on 10/6 with extreme weakness and leg cramps. EMS found CBG to be >500 mg/dl and in the ED he was hypertensive. He was admitted for hyperglycemia and hypokalemia. Patient reported being out of insulin x1 week PTA and having diarrhea at home 2/2 taking lactulose.  Weight recorded on 10/13 exactly the same as weight on 10/7. Per flow sheet, patient most recently consumed 100% of breakfast and lunch on 10/10 (total of 1670 kcal, 53 grams protein); 100% of breakfast on 10/11 (869 kcal, 30 grams); 100% of lunch and dinner on 10/12 (total of 1114 kcal, 63 grams protein).  Per review of orders, patient has been accepting all cartons of Boost Breeze, nearly all bottles of Ensure, and all packet of prostat offered to him. Will d/c Boost Breeze d/t hyperglycemic events.    Per notes: - severe hypokalemia--resolved - hypophosphatemia--resolved - UTI - L neck abscess and cervical spine osteomyelitis--TEE negative; plan for 6 weeks of IV abx - hyperglycemia and hypoglycemia--uncontrolled DM with last A1c of 17.7%; was not using insulin at home - homelessness--plan for d/c to a shelter when medically  stable   Labs reviewed; CBGs: 104 and 418 mg/dl today. Medications reviewed; 1 mg folvite/day, sliding scale novolog, 10 units/day, 12000 units creon TID, 400 mg mag-ox BID, daily multivitamin with minerals, 100 mg thiamine/day.      NUTRITION - FOCUSED PHYSICAL EXAM:    Most Recent Value  Orbital Region  Moderate depletion  Upper Arm Region  Severe depletion  Thoracic and Lumbar Region  Severe depletion  Buccal Region  Severe depletion  Temple Region  Moderate depletion  Clavicle Bone Region  Severe depletion  Clavicle and Acromion Bone Region  Severe depletion  Scapular Bone Region  Unable to assess  Dorsal Hand  Moderate depletion  Patellar Region  Severe depletion  Anterior Thigh Region  Severe depletion  Posterior Calf Region  Severe depletion  Edema (RD Assessment)  Mild [BLE]  Hair  Reviewed  Eyes  Reviewed  Mouth  Reviewed  Skin  Reviewed  Nails  Reviewed       Diet Order:   Diet Order            Diet Carb Modified Fluid consistency: Thin; Room service appropriate? Yes  Diet effective now              EDUCATION NEEDS:   Not appropriate for education at this time  Skin:  Skin Assessment: Reviewed RN Assessment  Last BM:  10/13  Height:   Ht Readings from Last 1 Encounters:  05/20/19 _0  (1.727 m)    Weight:   Wt Readings from Last 1 Encounters:  05/20/19 46.4 kg    Ideal Body Weight:  70 kg  BMI:  Body  mass index is 15.57 kg/m.  Estimated Nutritional Needs:   Kcal:  1855-2090 kcal (40-45 kcal/kg)  Protein:  80-90 grams  Fluid:  >/= 2 L/day     Jarome Matin, MS, RD, LDN, [facility] Inpatient Clinical Dietitian Pager # [phone] After hours/weekend pager # [phone]

## 2019-05-21 NOTE — Progress Notes (Signed)
Physical Therapy Treatment Patient Details Name: Rodney Barber MRN: [id number] DOB: [birth year] Today's Date: 05/21/2019    History of Present Illness 53 year old male with history of insulin-dependent diabetes, hypertension, chronic pancreatitis, smoker, recent prolonged admission in the hospital complicated with initial suicidal ideation later cleared, deconditioning, now homeless presents to the emergency room with extreme weakness and leg cramps, EMS found with blood sugars of 500,severe hypokalemia, hypophosphatemia, and MSSA UTI , MSSA left neck abscess and C-spine osteomyelitis    PT Comments    Pt is progressing well with mobility, he ambulated 200' with a straight cane, no loss of balance. Pt denied pain. Instructed pt in seated BLE strengthening exercises.   Follow Up Recommendations  No PT follow up     Equipment Recommendations  Cane   Recommendations for Other Services       Precautions / Restrictions Precautions Precautions: Fall Restrictions Weight Bearing Restrictions: No    Mobility  Bed Mobility Overal bed mobility: Independent Bed Mobility: Supine to Sit              Transfers Overall transfer level: Needs assistance Equipment used: None Transfers: Sit to/from Stand Sit to Stand: Supervision            Ambulation/Gait Ambulation/Gait assistance: Supervision Gait Distance (Feet): 200 Feet Assistive device: Straight cane Gait Pattern/deviations: Step-through pattern;Decreased stride length Gait velocity: decr   General Gait Details: steady with no loss of balance, pt denied pain   Stairs             Wheelchair Mobility    Modified Rankin (Stroke Patients Only)       Balance Overall balance assessment: Mild deficits observed, not formally tested   Sitting balance-Leahy Scale: Good     Standing balance support: No upper extremity supported Standing balance-Leahy Scale: Fair Standing balance comment: standing at sink  to brush teeth                            Cognition Arousal/Alertness: Awake/alert Behavior During Therapy: WFL for tasks assessed/performed Overall Cognitive Status: Within Functional Limits for tasks assessed                                        Exercises General Exercises - Lower Extremity Ankle Circles/Pumps: AROM;Both;10 reps;Seated Long Arc Quad: AROM;Both;10 reps;Seated Hip Flexion/Marching: AROM;Both;10 reps;Seated    General Comments        Pertinent Vitals/Pain Pain Assessment: No/denies pain    Home Living                      Prior Function            PT Goals (current goals can now be found in the care plan section) Acute Rehab PT Goals Patient Stated Goal: move better PT Goal Formulation: With patient Time For Goal Achievement: 05/28/19 Potential to Achieve Goals: Good    Frequency    Min 3X/week      PT Plan Current plan remains appropriate    Co-evaluation              AM-PAC PT "6 Clicks" Mobility   Outcome Measure  Help needed turning from your back to your side while in a flat bed without using bedrails?: None Help needed moving from lying on your back to sitting on the side of a flat  bed without using bedrails?: None Help needed moving to and from a bed to a chair (including a wheelchair)?: A Little Help needed standing up from a chair using your arms (e.g., wheelchair or bedside chair)?: A Little Help needed to walk in hospital room?: A Little Help needed climbing 3-5 steps with a railing? : A Little 6 Click Score: 20    End of Session Equipment Utilized During Treatment: Gait belt Activity Tolerance: Patient tolerated treatment well Patient left: with call bell/phone within reach;with chair alarm set;in chair Nurse Communication: Mobility status PT Visit Diagnosis: Muscle weakness (generalized) (M62.81);Unsteadiness on feet (R26.81)     Time: 4132-4401 PT Time Calculation (min)  (ACUTE ONLY): 15 min  Charges:  $Gait Training: 8-22 mins                     Blondell Reveal Kistler PT 05/21/2019  Acute Rehabilitation Services Pager [phone] Office [phone]

## 2019-05-21 NOTE — Progress Notes (Signed)
Inpatient Diabetes Program Recommendations  AACE/ADA: New Consensus Statement on Inpatient Glycemic Control (2015)  Target Ranges:  Prepandial:   less than 140 mg/dL      Peak postprandial:   less than 180 mg/dL (1-2 hours)      Critically ill patients:  140 - 180 mg/dL   Lab Results  Component Value Date   GLUCAP 418 (H) 05/21/2019   HGBA1C 17.7 (H) 02/18/2019    Review of Glycemic Control Results for Rodney Barber, Rodney Barber (MRN [id number]) as of 05/21/2019 13:12  Ref. Range 05/20/2019 07:53 05/20/2019 16:17 05/20/2019 16:52 05/20/2019 21:45 05/21/2019 08:13 05/21/2019 12:42  Glucose-Capillary Latest Ref Range: 70 - 99 mg/dL 96 48 (L) 95 358 (H) 104 (H) 418 (H)   Diabetes history: DM 2 Outpatient Diabetes medications:  Current orders for Inpatient glycemic control:  Lantus 10 units qhs Novolog 0-9 units + hs  Ensure bid  Inpatient Diabetes Program Recommendations:    Note pt had hypoglycemia yesterday.  Glucose trends increase significantly especially after PO supplements.  May consider meal or supplement coverage if PO intake is >50%.  Thanks,  Tama Headings RN, MSN, BC-ADM Inpatient Diabetes Coordinator Team Pager [phone] (8a-5p)

## 2019-05-22 LAB — GLUCOSE, CAPILLARY
Glucose-Capillary: 72 mg/dL (ref 70–99)
Glucose-Capillary: 218 mg/dL — ABNORMAL HIGH (ref 70–99)
Glucose-Capillary: 411 mg/dL — ABNORMAL HIGH (ref 70–99)
Glucose-Capillary: 237 mg/dL — ABNORMAL HIGH (ref 70–99)

## 2019-05-22 MED ORDER — GLUCERNA SHAKE PO LIQD
237.0000 mL | Freq: Two times a day (BID) | ORAL | Status: DC
  Administered 2019-05-22: 237 mL via ORAL
  Administered 2019-05-23: 20:00:00 via ORAL
  Administered 2019-05-24 – 2019-06-25 (×64): 237 mL via ORAL
  Filled 2019-05-22 (×73): qty 237

## 2019-05-22 MED ORDER — INSULIN ASPART 100 UNIT/ML ~~LOC~~ SOLN
5.0000 [IU] | Freq: Three times a day (TID) | SUBCUTANEOUS | Status: DC
  Administered 2019-05-23: 12:00:00 5 [IU] via SUBCUTANEOUS

## 2019-05-22 NOTE — Progress Notes (Signed)
Inpatient Diabetes Program Recommendations  AACE/ADA: New Consensus Statement on Inpatient Glycemic Control (2015)  Target Ranges:  Prepandial:   less than 140 mg/dL      Peak postprandial:   less than 180 mg/dL (1-2 hours)      Critically ill patients:  140 - 180 mg/dL   Lab Results  Component Value Date   GLUCAP 218 (H) 05/22/2019   HGBA1C 17.7 (H) 02/18/2019    Review of Glycemic Control Results for Rodney Barber, Rodney Barber (MRN [id number]) as of 05/22/2019 12:13  Ref. Range 05/21/2019 08:13 05/21/2019 12:42 05/21/2019 16:48 05/21/2019 21:43 05/22/2019 07:54 05/22/2019 11:50  Glucose-Capillary Latest Ref Range: 70 - 99 mg/dL 104 (H) 418 (H) 354 (H) 241 (H) 72 218 (H)    Diabetes history: DM 2 Outpatient Diabetes medications:  Current orders for Inpatient glycemic control:  Lantus 10 units qhs Novolog 0-9 units + hs  Ensure bid  Inpatient Diabetes Program Recommendations:    Fasting glucose at goal.  Glucose trends increase significantly especially after PO supplements.  May consider meal or supplement coverage with paramteters.   Novolog 3 units tid if pt consumes at least 50% of meal and glucose is at least 80 mg/dl.  Thanks,  Tama Headings RN, MSN, BC-ADM Inpatient Diabetes Coordinator Team Pager [phone] (8a-5p)

## 2019-05-22 NOTE — Progress Notes (Signed)
[facility]   Reason for visit: Follow up on C3-4 osteomyelitis, septic facet joint with abscess  Interval History: No new issues.  No acute events.  Remains afebrile.  No associated rash or diarrhea.   Physical Exam: Constitutional:  Vitals:   05/22/19 0958 05/22/19 1456  BP: 123/68 136/85  Pulse: 70 74  Resp:  18  Temp: 98.4 F (36.9 C) 98.2 F (36.8 C)  SpO2:  100%   patient appears in NAD Eyes: anicteric HENT: poor dentition  Respiratory: Normal respiratory effort; CTA B Cardiovascular: RRR  Review of Systems: Constitutional: negative for fevers and chills Gastrointestinal: negative for nausea and diarrhea Integument/breast: negative for rash  Lab Results  Component Value Date   WBC 7.0 05/19/2019   HGB 9.8 (L) 05/19/2019   HCT 30.9 (L) 05/19/2019   MCV 94.2 05/19/2019   PLT 306 05/19/2019    Lab Results  Component Value Date   CREATININE 0.63 05/19/2019   BUN 16 05/19/2019   NA 136 05/19/2019   K 4.3 05/19/2019   CL 99 05/19/2019   CO2 29 05/19/2019    Lab Results  Component Value Date   ALT 18 05/14/2019   AST 37 05/14/2019   ALKPHOS 218 (H) 05/14/2019     Microbiology: Recent Results (from the past 240 hour(s))  Urine culture     Status: Abnormal   Collection Time: 05/13/19  9:51 PM   Specimen: Urine, Random  Result Value Ref Range Status   Specimen Description   Final    URINE, RANDOM Performed at [facility], [place] [address] [place], [place] [address]    Special Requests   Final    NONE Performed at [facility], [place] [address], [place], [place] [address]    Culture >=100,000 COLONIES/mL STAPHYLOCOCCUS AUREUS (A)  Final   Report Status 05/16/2019 FINAL  Final   Organism ID, Bacteria STAPHYLOCOCCUS AUREUS (A)  Final      Susceptibility   Staphylococcus aureus - MIC*    CIPROFLOXACIN <=0.5 SENSITIVE Sensitive     GENTAMICIN <=0.5 SENSITIVE Sensitive     NITROFURANTOIN  <=16 SENSITIVE Sensitive     OXACILLIN 0.5 SENSITIVE Sensitive     TETRACYCLINE <=1 SENSITIVE Sensitive     VANCOMYCIN 1 SENSITIVE Sensitive     TRIMETH/SULFA <=10 SENSITIVE Sensitive     CLINDAMYCIN <=0.25 SENSITIVE Sensitive     RIFAMPIN <=0.5 SENSITIVE Sensitive     Inducible Clindamycin NEGATIVE Sensitive     * >=100,000 COLONIES/mL STAPHYLOCOCCUS AUREUS  SARS CORONAVIRUS 2 (TAT 6-24 HRS) Nasopharyngeal Nasopharyngeal Swab     Status: None   Collection Time: 05/13/19 11:37 PM   Specimen: Nasopharyngeal Swab  Result Value Ref Range Status   SARS Coronavirus 2 NEGATIVE NEGATIVE Final    Comment: (NOTE) SARS-CoV-2 target nucleic acids are NOT DETECTED. The SARS-CoV-2 RNA is generally detectable in upper and lower respiratory specimens during the acute phase of infection. Negative results do not preclude SARS-CoV-2 infection, do not rule out co-infections with other pathogens, and should not be used as the sole basis for treatment or other patient management decisions. Negative results must be combined with clinical observations, patient history, and epidemiological information. The expected result is Negative. Fact Sheet for Patients: SugarRoll.be Fact Sheet for Healthcare Providers: https://www.woods-mathews.com/ This test is not yet approved or cleared by the [place] FDA and  has been authorized for detection and/or diagnosis of SARS-CoV-2 by FDA under an Emergency Use  Authorization (EUA). This EUA will remain  in effect (meaning this test can be used) for the duration of the COVID-19 declaration under Section 56 4(b)(1) of the Act, 21 U.S.C. section 360bbb-3(b)(1), unless the authorization is terminated or revoked sooner. Performed at [facility], [address], [place], [place] [address]   Culture, blood (routine x 2)     Status: None   Collection Time: 05/15/19 12:19 PM   Specimen: BLOOD LEFT HAND  Result Value Ref  Range Status   Specimen Description   Final    BLOOD LEFT HAND Performed at [facility], [address] [place], [place] [address]    Special Requests   Final    BOTTLES DRAWN AEROBIC AND ANAEROBIC Blood Culture adequate volume Performed at [facility], [address], [place], [place] [address]    Culture   Final    NO GROWTH 5 DAYS Performed at [facility], [address], [place], [place] [address]    Report Status 05/20/2019 FINAL  Final  Culture, blood (routine x 2)     Status: None   Collection Time: 05/15/19 12:25 PM   Specimen: BLOOD RIGHT ARM  Result Value Ref Range Status   Specimen Description   Final    BLOOD RIGHT ARM Performed at [facility], [address] [place], [place] [address]    Special Requests   Final    BOTTLES DRAWN AEROBIC ONLY Blood Culture adequate volume Performed at [facility], [address] [place], [place] [address]    Culture   Final    NO GROWTH 5 DAYS Performed at [facility], [address], [place], [place] [address]    Report Status 05/20/2019 FINAL  Final  Aerobic/Anaerobic Culture (surgical/deep wound)     Status: None   Collection Time: 05/16/19  4:45 PM   Specimen: Abscess  Result Value Ref Range Status   Specimen Description   Final    ABSCESS LEFT NECK Performed at [facility], [address], [place], [place] [address]    Special Requests   Final    NONE Performed at [facility], [address], [place], [place] [address]    Gram Stain   Final    ABUNDANT WBC PRESENT, PREDOMINANTLY PMN MODERATE GRAM POSITIVE COCCI IN CLUSTERS    Culture   Final    ABUNDANT STAPHYLOCOCCUS AUREUS NO ANAEROBES ISOLATED Performed at [facility], [address] [place], [place] [address]    Report Status 05/21/2019 FINAL  Final   Organism ID, Bacteria STAPHYLOCOCCUS AUREUS  Final       Susceptibility   Staphylococcus aureus - MIC*    CIPROFLOXACIN <=0.5 SENSITIVE Sensitive     ERYTHROMYCIN <=0.25 SENSITIVE Sensitive     GENTAMICIN <=0.5 SENSITIVE Sensitive     OXACILLIN 0.5 SENSITIVE Sensitive     TETRACYCLINE <=1 SENSITIVE Sensitive     VANCOMYCIN <=0.5 SENSITIVE Sensitive     TRIMETH/SULFA <=10 SENSITIVE Sensitive     CLINDAMYCIN <=0.25 SENSITIVE Sensitive     RIFAMPIN <=0.5 SENSITIVE Sensitive     Inducible Clindamycin NEGATIVE Sensitive     * ABUNDANT STAPHYLOCOCCUS AUREUS    Impression/Plan:  1.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  At the time of completion, if you reconsult ID we can determine if prolonged oral therapy indicated at  discharge with Keflex.  Not a home IV therapy candidate since he is homeless.  ? NH/SNF.    2.  Medication monitoring - creast remains wnl  I will sign off, let us know toward the end of treatment and we can determine further oral antibiotic needs.  thanks

## 2019-05-22 NOTE — Plan of Care (Signed)
  Problem: Clinical Measurements: Goal: Respiratory complications will improve Outcome: Progressing Goal: Cardiovascular complication will be avoided Outcome: Progressing   Problem: Activity: Goal: Risk for activity intolerance will decrease Outcome: Progressing   Problem: Nutrition: Goal: Adequate nutrition will be maintained Outcome: Progressing   Problem: Elimination: Goal: Will not experience complications related to bowel motility Outcome: Progressing   Problem: Pain Managment: Goal: General experience of comfort will improve Outcome: Progressing   Problem: Safety: Goal: Ability to remain free from injury will improve Outcome: Progressing   

## 2019-05-22 NOTE — Progress Notes (Signed)
PROGRESS NOTE    Rodney Barber  TMA:[id number] DOB: 10-25-1965 DOA: 05/13/2019 PCP: Gildardo Pounds, NP    Brief Narrative:  53 year old male with history of insulin-dependent diabetes, hypertension, chronic pancreatitis, smoker, recent prolonged admission in the hospital complicated with initial suicidal ideation later cleared, deconditioning, now homeless presents to the emergency room with extreme weakness and leg cramps, EMS found with blood sugars of 500.  In the emergency room blood pressures elevated.  Potassium less than 2.  EKG with U waves.  Patient admitted to the hospital with hyperglycemia and severe hypokalemia as well UTI like presentation. Patient stated that he is out of insulin for more than a week.  Patient had diarrhea secondary to taking lactulose at home for high ammonia.  After admission, his urine culture grew MSSA, was on Rocephin, he had left-sided neck abscess, that also grew MSSA.  Blood cultures negative, however it was drawn after being on Rocephin.   Assessment & Plan:   Principal Problem:   Hypokalemia Active Problems:   Uncontrolled type 2 diabetes mellitus (HCC)   UTI (urinary tract infection)   Hyperglycemia   Tobacco use   Abnormal blood electrolyte level  Severe electrolyte abnormalities including hypokalemia and hypophosphatemia: Replaced aggressively with improvement.  Now with no more diarrhea.   MSSA UTI , MSSA left neck abscess and C-spine osteomyelitis:  Urine culture with MSSA, left neck aspirate with MSSA, blood cultures drawn after 2 days on Rocephin, no growth so far. Clinically stabilizing.  Remains on cefazolin.   TEE with no vegetation.  Plan for 6 weeks of IV antibiotics.  Hyperglycemia, uncontrolled insulin-dependent diabetes: Last A1c was 17.7.  Patient is not using any insulin at home.  Episodic hypoglycemia especially when NPO.  Will gradually uptitrate long-acting insulin.  Hypertension: On amlodipine continue.  Depression:  On Lexapro, continue.  Smoker: Counseled to quit.  Has nicotine patch.  Homelessness: Case management to help with medication and probably transition to shelter once medically stable. Anticipating patient may need prolonged hospitalization for intravenous antibiotics.   DVT prophylaxis: Lovenox subcu Code Status: DNR,  Family Communication: None Disposition Plan: Unknown at this time.   Consultants:   Infectious disease  Procedures:   Left neck abscess aspiration  Antimicrobials:   Rocephin, 05/13/2019----05/15/2019  Vancomycin, 05/15/2019----05/17/2019  Ancef, 05/17/2019----    Subjective: Seen and examined.  No overnight events.  Neck pain is minimum.  Ambulated with the help of therapies.  Objective: Vitals:   05/21/19 1404 05/21/19 2146 05/22/19 0513 05/22/19 0958  BP: 114/73 137/78 137/77 123/68  Pulse: 93 77 73   Resp: 16 16 18    Temp: 98 F (36.7 C) 98.4 F (36.9 C) 98.2 F (36.8 C)   TempSrc: Oral Oral Oral   SpO2: 100% 97% 100%   Weight:      Height:        Intake/Output Summary (Last 24 hours) at 05/22/2019 1049 Last data filed at 05/22/2019 2563 Gross per 24 hour  Intake 1517.48 ml  Output -  Net 1517.48 ml   Filed Weights   05/14/19 0240 05/20/19 1221  Weight: 46.4 kg 46.4 kg    Examination:  General exam: Appears calm and comfortable.  Not in any distress.  On room air. Respiratory system: Clear to auscultation. Respiratory effort normal. Cardiovascular system: S1 & S2 heard, RRR. No JVD, murmurs, rubs, gallops or clicks. No pedal edema. Gastrointestinal system: Abdomen is nondistended, soft and nontender. No organomegaly or masses felt. Normal bowel sounds heard. Central nervous  system: Alert and oriented. No focal neurological deficits. Extremities: Symmetric 5 x 5 power. Skin: No rashes, lesions or ulcers Psychiatry: Judgement and insight appear normal. Mood & affect appropriate.    Data Reviewed: I have personally reviewed  following labs and imaging studies  CBC: Recent Labs  Lab 05/16/19 0432 05/17/19 0531 05/19/19 0454  WBC 12.1* 8.0 7.0  NEUTROABS 8.5* 5.2 4.5  HGB 11.9* 10.6* 9.8*  HCT 37.5* 33.7* 30.9*  MCV 95.2 94.9 94.2  PLT 281 252 306   Basic Metabolic Panel: Recent Labs  Lab 05/16/19 0432 05/17/19 0531 05/19/19 0454  NA 137 133* 136  K 5.5* 5.1 4.3  CL 102 100 99  CO2 25 26 29   GLUCOSE 43* 181* 169*  BUN 12 11 16   CREATININE 0.63 0.63 0.63  CALCIUM 8.3* 8.1* 8.4*  MG 1.9 2.0 1.8  PHOS 2.3* 3.6 3.8   GFR: Estimated Creatinine Clearance: 69.3 mL/min (by C-G formula based on SCr of 0.63 mg/dL). Liver Function Tests: Recent Labs  Lab 05/17/19 0531 05/19/19 0454  ALBUMIN 2.2* 2.3*   No results for input(s): LIPASE, AMYLASE in the last 168 hours. No results for input(s): AMMONIA in the last 168 hours. Coagulation Profile: Recent Labs  Lab 05/16/19 0432  INR 0.8   Cardiac Enzymes: No results for input(s): CKTOTAL, CKMB, CKMBINDEX, TROPONINI in the last 168 hours. BNP (last 3 results) No results for input(s): PROBNP in the last 8760 hours. HbA1C: No results for input(s): HGBA1C in the last 72 hours. CBG: Recent Labs  Lab 05/21/19 0813 05/21/19 1242 05/21/19 1648 05/21/19 2143 05/22/19 0754  GLUCAP 104* 418* 354* 241* 72   Lipid Profile: No results for input(s): CHOL, HDL, LDLCALC, TRIG, CHOLHDL, LDLDIRECT in the last 72 hours. Thyroid Function Tests: No results for input(s): TSH, T4TOTAL, FREET4, T3FREE, THYROIDAB in the last 72 hours. Anemia Panel: No results for input(s): VITAMINB12, FOLATE, FERRITIN, TIBC, IRON, RETICCTPCT in the last 72 hours. Sepsis Labs: No results for input(s): PROCALCITON, LATICACIDVEN in the last 168 hours.  Recent Results (from the past 240 hour(s))  Urine culture     Status: Abnormal   Collection Time: 05/13/19  9:51 PM   Specimen: Urine, Random  Result Value Ref Range Status   Specimen Description   Final    URINE, RANDOM  Performed at [facility], [address] [place], [place] [address]    Special Requests   Final    NONE Performed at [facility], [address] [place], [place] [address]    Culture >=100,000 COLONIES/mL STAPHYLOCOCCUS AUREUS (A)  Final   Report Status 05/16/2019 FINAL  Final   Organism ID, Bacteria STAPHYLOCOCCUS AUREUS (A)  Final      Susceptibility   Staphylococcus aureus - MIC*    CIPROFLOXACIN <=0.5 SENSITIVE Sensitive     GENTAMICIN <=0.5 SENSITIVE Sensitive     NITROFURANTOIN <=16 SENSITIVE Sensitive     OXACILLIN 0.5 SENSITIVE Sensitive     TETRACYCLINE <=1 SENSITIVE Sensitive     VANCOMYCIN 1 SENSITIVE Sensitive     TRIMETH/SULFA <=10 SENSITIVE Sensitive     CLINDAMYCIN <=0.25 SENSITIVE Sensitive     RIFAMPIN <=0.5 SENSITIVE Sensitive     Inducible Clindamycin NEGATIVE Sensitive     * >=100,000 COLONIES/mL STAPHYLOCOCCUS AUREUS  SARS CORONAVIRUS 2 (TAT 6-24 HRS) Nasopharyngeal Nasopharyngeal Swab     Status: None   Collection Time: 05/13/19 11:37 PM   Specimen: Nasopharyngeal Swab  Result Value Ref Range Status  SARS Coronavirus 2 NEGATIVE NEGATIVE Final    Comment: (NOTE) SARS-CoV-2 target nucleic acids are NOT DETECTED. The SARS-CoV-2 RNA is generally detectable in upper and lower respiratory specimens during the acute phase of infection. Negative results do not preclude SARS-CoV-2 infection, do not rule out co-infections with other pathogens, and should not be used as the sole basis for treatment or other patient management decisions. Negative results must be combined with clinical observations, patient history, and epidemiological information. The expected result is Negative. Fact Sheet for Patients: HairSlick.no Fact Sheet for Healthcare Providers: quierodirigir.com This test is not yet approved or cleared by the [place] FDA and  has been authorized for  detection and/or diagnosis of SARS-CoV-2 by FDA under an Emergency Use Authorization (EUA). This EUA will remain  in effect (meaning this test can be used) for the duration of the COVID-19 declaration under Section 56 4(b)(1) of the Act, 21 U.S.C. section 360bbb-3(b)(1), unless the authorization is terminated or revoked sooner. Performed at [facility], [address] [place], [place] [address]   Culture, blood (routine x 2)     Status: None   Collection Time: 05/15/19 12:19 PM   Specimen: BLOOD LEFT HAND  Result Value Ref Range Status   Specimen Description   Final    BLOOD LEFT HAND Performed at [facility], [address] [place], [place] [address]    Special Requests   Final    BOTTLES DRAWN AEROBIC AND ANAEROBIC Blood Culture adequate volume Performed at [facility] [address] [place], [place] [address]    Culture   Final    NO GROWTH 5 DAYS Performed at [facility], [address] [place], [place] [address]    Report Status 05/20/2019 FINAL  Final  Culture, blood (routine x 2)     Status: None   Collection Time: 05/15/19 12:25 PM   Specimen: BLOOD RIGHT ARM  Result Value Ref Range Status   Specimen Description   Final    BLOOD RIGHT ARM Performed at [facility], [address] [place], [place] [address]    Special Requests   Final    BOTTLES DRAWN AEROBIC ONLY Blood Culture adequate volume Performed at [facility], [address], [place], [place] [address]    Culture   Final    NO GROWTH 5 DAYS Performed at [facility], [address] [place], [place] [address]    Report Status 05/20/2019 FINAL  Final  Aerobic/Anaerobic Culture (surgical/deep wound)     Status: None   Collection Time: 05/16/19  4:45 PM   Specimen: Abscess  Result Value Ref Range Status   Specimen Description   Final    ABSCESS LEFT NECK Performed at [facility],  [address] [place], [place] [address]    Special Requests   Final    NONE Performed at [facility], [address] [place], [place] [address]    Gram Stain   Final    ABUNDANT WBC PRESENT, PREDOMINANTLY PMN MODERATE GRAM POSITIVE COCCI IN CLUSTERS    Culture   Final    ABUNDANT STAPHYLOCOCCUS AUREUS NO ANAEROBES ISOLATED Performed at [facility], [address], [place], [place] [address]    Report Status 05/21/2019 FINAL  Final   Organism ID, Bacteria STAPHYLOCOCCUS AUREUS  Final      Susceptibility   Staphylococcus aureus - MIC*    CIPROFLOXACIN <=0.5  SENSITIVE Sensitive     ERYTHROMYCIN <=0.25 SENSITIVE Sensitive     GENTAMICIN <=0.5 SENSITIVE Sensitive     OXACILLIN 0.5 SENSITIVE Sensitive     TETRACYCLINE <=1 SENSITIVE Sensitive     VANCOMYCIN <=0.5 SENSITIVE Sensitive     TRIMETH/SULFA <=10 SENSITIVE Sensitive     CLINDAMYCIN <=0.25 SENSITIVE Sensitive     RIFAMPIN <=0.5 SENSITIVE Sensitive     Inducible Clindamycin NEGATIVE Sensitive     * ABUNDANT STAPHYLOCOCCUS AUREUS         Radiology Studies: No results found.      Scheduled Meds: . amLODipine  5 mg Oral Daily  . atorvastatin  20 mg Oral q1800  . escitalopram  5 mg Oral Daily  . feeding supplement (ENSURE ENLIVE)  237 mL Oral BID BM  . feeding supplement (PRO-STAT SUGAR FREE 64)  30 mL Oral BID  . folic acid  1 mg Oral Daily  . influenza vac split quadrivalent PF  0.5 mL Intramuscular Tomorrow-1000  . insulin aspart  0-5 Units Subcutaneous QHS  . insulin aspart  0-9 Units Subcutaneous TID WC  . insulin glargine  10 Units Subcutaneous QHS  . lipase/protease/amylase  12,000 Units Oral TID WC  . lisinopril  10 mg Oral Daily  . magnesium oxide  400 mg Oral BID  . multivitamin with minerals  1 tablet Oral Daily  . nicotine  14 mg Transdermal Daily  . thiamine  100 mg Oral Daily   Continuous Infusions: .  ceFAZolin (ANCEF) IV Stopped (05/22/19 6712)     LOS: 8  days    Time spent: 25 minutes    Dorcas Carrow, MD [facility] Pager [phone]  If 7PM-7AM, please contact night-coverage [email or web address] Password [email or web address] 05/22/2019, 10:49 AM

## 2019-05-22 NOTE — Progress Notes (Signed)
Occupational Therapy Treatment Patient Details Name: Rodney Barber MRN: [id number] DOB: [birth year] Today's Date: 05/22/2019    History of present illness 53 year old male with history of insulin-dependent diabetes, hypertension, chronic pancreatitis, smoker, recent prolonged admission in the hospital complicated with initial suicidal ideation later cleared, deconditioning, now homeless presents to the emergency room with extreme weakness and leg cramps, EMS found with blood sugars of 500,severe hypokalemia, hypophosphatemia, and MSSA UTI , MSSA left neck abscess and C-spine osteomyelitis   OT comments  Pt progressing towards acute OT goals. Currently setup level with ADLs, supervision with cane for functional mobility/transfers. Issued level 1 theraband and hand held squeeze ball for general UB strengthening. D/c plan remains appropriate.    Follow Up Recommendations  No OT follow up    Equipment Recommendations  None recommended by OT    Recommendations for Other Services      Precautions / Restrictions Precautions Precautions: Fall Restrictions Weight Bearing Restrictions: No       Mobility Bed Mobility Overal bed mobility: Independent                Transfers Overall transfer level: Needs assistance Equipment used: Straight cane Transfers: Sit to/from Stand Sit to Stand: Supervision         General transfer comment: for safety    Balance Overall balance assessment: Mild deficits observed, not formally tested   Sitting balance-Leahy Scale: Good     Standing balance support: No upper extremity supported Standing balance-Leahy Scale: Fair                             ADL either performed or assessed with clinical judgement   ADL Overall ADL's : Needs assistance/impaired     Grooming: Set up;Supervision/safety;Standing                               Functional mobility during ADLs: Supervision/safety;Cane General ADL  Comments: Pt utilized cane this session. supervision for mobilizing in the room.     Vision       Perception     Praxis      Cognition Arousal/Alertness: Awake/alert Behavior During Therapy: WFL for tasks assessed/performed Overall Cognitive Status: Within Functional Limits for tasks assessed                                          Exercises Exercises: Other exercises Other Exercises Other Exercises: Issued therapy hand strengthening squeeze ball and level 1 theraband and instructed in use for general UB strengthening.   Shoulder Instructions       General Comments      Pertinent Vitals/ Pain       Pain Assessment: No/denies pain  Home Living                                          Prior Functioning/Environment              Frequency  Min 2X/week        Progress Toward Goals  OT Goals(current goals can now be found in the care plan section)  Progress towards OT goals: Progressing toward goals  Acute Rehab OT Goals Patient Stated Goal: move better OT  Goal Formulation: With patient Time For Goal Achievement: 05/28/19 Potential to Achieve Goals: Good ADL Goals Pt Will Perform Grooming: with modified independence;standing Pt Will Perform Upper Body Bathing: with modified independence;sitting Pt Will Perform Lower Body Bathing: with modified independence;sit to/from stand Pt Will Perform Upper Body Dressing: with modified independence;sitting Pt Will Perform Lower Body Dressing: with modified independence;sit to/from stand Pt Will Transfer to Toilet: with modified independence;ambulating Pt Will Perform Toileting - Clothing Manipulation and hygiene: with modified independence;sit to/from stand  Plan Discharge plan remains appropriate    Co-evaluation                 AM-PAC OT "6 Clicks" Daily Activity     Outcome Measure   Help from another person eating meals?: None Help from another person taking care  of personal grooming?: None Help from another person toileting, which includes using toliet, bedpan, or urinal?: A Little Help from another person bathing (including washing, rinsing, drying)?: A Little Help from another person to put on and taking off regular upper body clothing?: None Help from another person to put on and taking off regular lower body clothing?: A Little 6 Click Score: 21    End of Session Equipment Utilized During Treatment: Other (comment)(cane)  OT Visit Diagnosis: Unsteadiness on feet (R26.81);Pain   Activity Tolerance Patient tolerated treatment well   Patient Left in bed;with call bell/phone within reach;with bed alarm set   Nurse Communication          Time: 7782-4235 OT Time Calculation (min): 12 min  Charges: OT General Charges $OT Visit: 1 Visit OT Treatments $Self Care/Home Management : 8-22 mins  Rodney Barber, OT Acute Rehabilitation Services Pager: [phone] Office: [phone]    Pilar Grammes 05/22/2019, 12:52 PM

## 2019-05-23 LAB — GLUCOSE, CAPILLARY
Glucose-Capillary: 106 mg/dL — ABNORMAL HIGH (ref 70–99)
Glucose-Capillary: 41 mg/dL — CL (ref 70–99)
Glucose-Capillary: 210 mg/dL — ABNORMAL HIGH (ref 70–99)
Glucose-Capillary: 237 mg/dL — ABNORMAL HIGH (ref 70–99)
Glucose-Capillary: 279 mg/dL — ABNORMAL HIGH (ref 70–99)

## 2019-05-23 MED ORDER — INSULIN ASPART 100 UNIT/ML ~~LOC~~ SOLN
4.0000 [IU] | Freq: Three times a day (TID) | SUBCUTANEOUS | Status: DC
  Administered 2019-05-23 – 2019-05-28 (×12): 4 [IU] via SUBCUTANEOUS

## 2019-05-23 MED ORDER — INSULIN GLARGINE 100 UNIT/ML ~~LOC~~ SOLN
10.0000 [IU] | Freq: Every day | SUBCUTANEOUS | Status: DC
  Administered 2019-05-24: 09:00:00 10 [IU] via SUBCUTANEOUS
  Filled 2019-05-23: qty 0.1

## 2019-05-23 NOTE — Plan of Care (Signed)

## 2019-05-23 NOTE — Progress Notes (Signed)
Physical Therapy Discharge Patient Details Name: Rodney Barber MRN: [id number] DOB: [birth year] Today's Date: 05/23/2019 Time:  -     Patient discharged from PT services secondary to goals met and no further PT needs identified.  Please see latest therapy progress note for current level of functioning and progress toward goals.    Progress and discharge plan discussed with patient and/or caregiver: Patient/Caregiver agrees with plan  Pt reports he just ambulated in hallway and feels good with mobilizing with SPC.  Pt declined need to practice any steps.  Pt agreeable to continue ambulating during acute stay.  RN states pt did ambulate on his own today in hallway and agreeable to continuing encouraging ambulating (since pt may have long length of stay)  No further skilled PT needs identified with discussion with pt.  Pt also moving well and educated on exercises with last PT visit.     [name],KATHrine E 05/23/2019, 11:26 AM  Carmelia Bake, PT, DPT Acute Rehabilitation Services Office: [phone] Pager: [phone]

## 2019-05-23 NOTE — Progress Notes (Signed)
PROGRESS NOTE    Rodney Barber  ZOX:[id number] DOB: 12-Mar-1966 DOA: 05/13/2019 PCP: [name], NP    Brief Narrative:  53 year old male with history of insulin-dependent diabetes, hypertension, chronic pancreatitis, smoker, recent prolonged admission in the hospital complicated with initial suicidal ideation later cleared, deconditioning, now homeless presents to the emergency room with extreme weakness and leg cramps, EMS found with blood sugars of 500.  In the emergency room blood pressures elevated.  Potassium less than 2.  EKG with U waves.  Patient admitted to the hospital with hyperglycemia and severe hypokalemia as well UTI like presentation. Patient stated that he is out of insulin for more than a week.  Patient had diarrhea secondary to taking lactulose at home for high ammonia.  After admission, his urine culture grew MSSA, was on Rocephin, he had left-sided neck abscess, that also grew MSSA.  Blood cultures negative, however it was drawn after being on Rocephin.   Assessment & Plan:   Principal Problem:   Hypokalemia Active Problems:   Uncontrolled type 2 diabetes mellitus (HCC)   UTI (urinary tract infection)   Hyperglycemia   Tobacco use   Abnormal blood electrolyte level  Severe electrolyte abnormalities including hypokalemia and hypophosphatemia: Replaced aggressively with improvement.  Now with no more diarrhea.   MSSA UTI , MSSA left neck abscess and C-spine osteomyelitis:  Urine culture with MSSA, left neck aspirate with MSSA, blood cultures drawn after 2 days on Rocephin, no growth so far. Clinically stabilizing.  Remains on cefazolin.   TEE with no vegetation.  Plan for 6 weeks of IV antibiotics. ID recommended cefazolin until 11/18, reconsult before discharge for continuing oral antibiotics.  Brittle diabetes, hypoglycemic episodes, hyperglycemia, uncontrolled insulin-dependent diabetes: Last A1c was 17.7.  Patient is not using any insulin at home.   Episodic hypoglycemia.  Oral intake improving.  Blood sugars range from 49-400. Will change Levemir to the morning dose. Start on prandial insulin.  Keeps on sliding scale insulin.  Hypertension: On amlodipine continue.  Depression: On Lexapro, continue.  Smoker: Counseled to quit.  Has nicotine patch.  Homelessness: Case management to help with medication and probably transition to shelter once medically stable. Anticipating patient may need prolonged hospitalization for intravenous antibiotics.   DVT prophylaxis: Lovenox subcu Code Status: DNR,  Family Communication: None Disposition Plan: Unknown at this time.  Tentative discharge date 06/25/2019.   Consultants:   Infectious disease  Procedures:   Left neck abscess aspiration  Antimicrobials:   Rocephin, 05/13/2019----05/15/2019  Vancomycin, 05/15/2019----05/17/2019  Ancef, 05/17/2019----    Subjective: Seen and examined.  No overnight events.    Objective: Vitals:   05/22/19 1456 05/22/19 2119 05/23/19 0528 05/23/19 1009  BP: 136/85 (!) 145/84 (!) 143/72 130/78  Pulse: 74 78 64   Resp: 18 20 18    Temp: 98.2 F (36.8 C) 98.3 F (36.8 C) 98.2 F (36.8 C)   TempSrc: Oral Oral Oral   SpO2: 100% 100% 99%   Weight:      Height:        Intake/Output Summary (Last 24 hours) at 05/23/2019 1343 Last data filed at 05/22/2019 1500 Gross per 24 hour  Intake 101.67 ml  Output -  Net 101.67 ml   Filed Weights   05/14/19 0240 05/20/19 1221  Weight: 46.4 kg 46.4 kg    Examination:  General exam: Appears calm and comfortable.  Not in any distress.  On room air. Respiratory system: Clear to auscultation. Respiratory effort normal. Cardiovascular system: S1 & S2  heard, RRR. No JVD, murmurs, rubs, gallops or clicks. No pedal edema. Gastrointestinal system: Abdomen is nondistended, soft and nontender. No organomegaly or masses felt. Normal bowel sounds heard. Central nervous system: Alert and oriented. No focal  neurological deficits. Extremities: Symmetric 5 x 5 power. Skin: No rashes, lesions or ulcers Psychiatry: Judgement and insight appear normal. Mood & affect appropriate.    Data Reviewed: I have personally reviewed following labs and imaging studies  CBC: Recent Labs  Lab 05/17/19 0531 05/19/19 0454  WBC 8.0 7.0  NEUTROABS 5.2 4.5  HGB 10.6* 9.8*  HCT 33.7* 30.9*  MCV 94.9 94.2  PLT 252 306   Basic Metabolic Panel: Recent Labs  Lab 05/17/19 0531 05/19/19 0454  NA 133* 136  K 5.1 4.3  CL 100 99  CO2 26 29  GLUCOSE 181* 169*  BUN 11 16  CREATININE 0.63 0.63  CALCIUM 8.1* 8.4*  MG 2.0 1.8  PHOS 3.6 3.8   GFR: Estimated Creatinine Clearance: 69.3 mL/min (by C-G formula based on SCr of 0.63 mg/dL). Liver Function Tests: Recent Labs  Lab 05/17/19 0531 05/19/19 0454  ALBUMIN 2.2* 2.3*   No results for input(s): LIPASE, AMYLASE in the last 168 hours. No results for input(s): AMMONIA in the last 168 hours. Coagulation Profile: No results for input(s): INR, PROTIME in the last 168 hours. Cardiac Enzymes: No results for input(s): CKTOTAL, CKMB, CKMBINDEX, TROPONINI in the last 168 hours. BNP (last 3 results) No results for input(s): PROBNP in the last 8760 hours. HbA1C: No results for input(s): HGBA1C in the last 72 hours. CBG: Recent Labs  Lab 05/22/19 1150 05/22/19 1704 05/22/19 2115 05/23/19 0809 05/23/19 1135  GLUCAP 218* 411* 237* 106* 210*   Lipid Profile: No results for input(s): CHOL, HDL, LDLCALC, TRIG, CHOLHDL, LDLDIRECT in the last 72 hours. Thyroid Function Tests: No results for input(s): TSH, T4TOTAL, FREET4, T3FREE, THYROIDAB in the last 72 hours. Anemia Panel: No results for input(s): VITAMINB12, FOLATE, FERRITIN, TIBC, IRON, RETICCTPCT in the last 72 hours. Sepsis Labs: No results for input(s): PROCALCITON, LATICACIDVEN in the last 168 hours.  Recent Results (from the past 240 hour(s))  Urine culture     Status: Abnormal   Collection  Time: 05/13/19  9:51 PM   Specimen: Urine, Random  Result Value Ref Range Status   Specimen Description   Final    URINE, RANDOM Performed at [facility], [address] [place], [place] [address]    Special Requests   Final    NONE Performed at [facility], [address] [place], [place] [address]    Culture >=100,000 COLONIES/mL STAPHYLOCOCCUS AUREUS (A)  Final   Report Status 05/16/2019 FINAL  Final   Organism ID, Bacteria STAPHYLOCOCCUS AUREUS (A)  Final      Susceptibility   Staphylococcus aureus - MIC*    CIPROFLOXACIN <=0.5 SENSITIVE Sensitive     GENTAMICIN <=0.5 SENSITIVE Sensitive     NITROFURANTOIN <=16 SENSITIVE Sensitive     OXACILLIN 0.5 SENSITIVE Sensitive     TETRACYCLINE <=1 SENSITIVE Sensitive     VANCOMYCIN 1 SENSITIVE Sensitive     TRIMETH/SULFA <=10 SENSITIVE Sensitive     CLINDAMYCIN <=0.25 SENSITIVE Sensitive     RIFAMPIN <=0.5 SENSITIVE Sensitive     Inducible Clindamycin NEGATIVE Sensitive     * >=100,000 COLONIES/mL STAPHYLOCOCCUS AUREUS  SARS CORONAVIRUS 2 (TAT 6-24 HRS) Nasopharyngeal Nasopharyngeal Swab     Status: None   Collection Time: 05/13/19 11:37 PM  Specimen: Nasopharyngeal Swab  Result Value Ref Range Status   SARS Coronavirus 2 NEGATIVE NEGATIVE Final    Comment: (NOTE) SARS-CoV-2 target nucleic acids are NOT DETECTED. The SARS-CoV-2 RNA is generally detectable in upper and lower respiratory specimens during the acute phase of infection. Negative results do not preclude SARS-CoV-2 infection, do not rule out co-infections with other pathogens, and should not be used as the sole basis for treatment or other patient management decisions. Negative results must be combined with clinical observations, patient history, and epidemiological information. The expected result is Negative. Fact Sheet for Patients: HairSlick.no Fact Sheet for Healthcare Providers:  quierodirigir.com This test is not yet approved or cleared by the [place] FDA and  has been authorized for detection and/or diagnosis of SARS-CoV-2 by FDA under an Emergency Use Authorization (EUA). This EUA will remain  in effect (meaning this test can be used) for the duration of the COVID-19 declaration under Section 56 4(b)(1) of the Act, 21 U.S.C. section 360bbb-3(b)(1), unless the authorization is terminated or revoked sooner. Performed at [facility], [address] [place], [place] [address]   Culture, blood (routine x 2)     Status: None   Collection Time: 05/15/19 12:19 PM   Specimen: BLOOD LEFT HAND  Result Value Ref Range Status   Specimen Description   Final    BLOOD LEFT HAND Performed at [facility], [address], [place], [place] [address]    Special Requests   Final    BOTTLES DRAWN AEROBIC AND ANAEROBIC Blood Culture adequate volume Performed at [facility], [address], [place], [place] [address]    Culture   Final    NO GROWTH 5 DAYS Performed at [facility], [address], [place], [place] [address]    Report Status 05/20/2019 FINAL  Final  Culture, blood (routine x 2)     Status: None   Collection Time: 05/15/19 12:25 PM   Specimen: BLOOD RIGHT ARM  Result Value Ref Range Status   Specimen Description   Final    BLOOD RIGHT ARM Performed at [facility], [address], [place], [place] [address]    Special Requests   Final    BOTTLES DRAWN AEROBIC ONLY Blood Culture adequate volume Performed at [facility], [address], [place], [place] [address]    Culture   Final    NO GROWTH 5 DAYS Performed at [facility], [address], [place], [place] [address]    Report Status 05/20/2019 FINAL  Final  Aerobic/Anaerobic Culture (surgical/deep wound)     Status: None   Collection Time: 05/16/19  4:45 PM   Specimen:  Abscess  Result Value Ref Range Status   Specimen Description   Final    ABSCESS LEFT NECK Performed at [facility], [address] [place], [place] [address]    Special Requests   Final    NONE Performed at [facility], [address], [place], [place] [address]    Gram Stain   Final    ABUNDANT WBC PRESENT, PREDOMINANTLY PMN MODERATE GRAM POSITIVE COCCI IN CLUSTERS    Culture   Final    ABUNDANT STAPHYLOCOCCUS AUREUS NO ANAEROBES ISOLATED Performed at [facility], [address] [place], [place] [address]    Report Status 05/21/2019 FINAL  Final   Organism ID, Bacteria STAPHYLOCOCCUS AUREUS  Final      Susceptibility  Staphylococcus aureus - MIC*    CIPROFLOXACIN <=0.5 SENSITIVE Sensitive     ERYTHROMYCIN <=0.25 SENSITIVE Sensitive     GENTAMICIN <=0.5 SENSITIVE Sensitive     OXACILLIN 0.5 SENSITIVE Sensitive     TETRACYCLINE <=1 SENSITIVE Sensitive     VANCOMYCIN <=0.5 SENSITIVE Sensitive     TRIMETH/SULFA <=10 SENSITIVE Sensitive     CLINDAMYCIN <=0.25 SENSITIVE Sensitive     RIFAMPIN <=0.5 SENSITIVE Sensitive     Inducible Clindamycin NEGATIVE Sensitive     * ABUNDANT STAPHYLOCOCCUS AUREUS         Radiology Studies: No results found.      Scheduled Meds: . amLODipine  5 mg Oral Daily  . atorvastatin  20 mg Oral q1800  . escitalopram  5 mg Oral Daily  . feeding supplement (GLUCERNA SHAKE)  237 mL Oral BID BM  . feeding supplement (PRO-STAT SUGAR FREE 64)  30 mL Oral BID  . folic acid  1 mg Oral Daily  . influenza vac split quadrivalent PF  0.5 mL Intramuscular Tomorrow-1000  . insulin aspart  0-5 Units Subcutaneous QHS  . insulin aspart  0-9 Units Subcutaneous TID WC  . insulin aspart  4 Units Subcutaneous TID WC  . [START ON 05/24/2019] insulin glargine  10 Units Subcutaneous Daily  . lipase/protease/amylase  12,000 Units Oral TID WC  . lisinopril  10 mg Oral Daily  . magnesium oxide  400 mg Oral BID   . multivitamin with minerals  1 tablet Oral Daily  . nicotine  14 mg Transdermal Daily  . thiamine  100 mg Oral Daily   Continuous Infusions: .  ceFAZolin (ANCEF) IV 2 g (05/23/19 1333)     LOS: 9 days    Time spent: 25 minutes    Dorcas Carrow, MD [facility] Pager [phone]  If 7PM-7AM, please contact night-coverage [email or web address] Password [email or web address] 05/23/2019, 1:43 PM

## 2019-05-23 NOTE — Progress Notes (Signed)
Hypoglycemic Event  CBG: 41  Treatment: 8 oz juice/soda  Symptoms: None  Follow-up CBG: Time:0900 CBG Result:103  Possible Reasons for Event: Unknown  Comments/MD notified:    Tristian Sickinger D

## 2019-05-24 LAB — GLUCOSE, CAPILLARY
Glucose-Capillary: 373 mg/dL — ABNORMAL HIGH (ref 70–99)
Glucose-Capillary: 225 mg/dL — ABNORMAL HIGH (ref 70–99)
Glucose-Capillary: 195 mg/dL — ABNORMAL HIGH (ref 70–99)
Glucose-Capillary: 152 mg/dL — ABNORMAL HIGH (ref 70–99)

## 2019-05-24 MED ORDER — INSULIN GLARGINE 100 UNIT/ML ~~LOC~~ SOLN
8.0000 [IU] | Freq: Two times a day (BID) | SUBCUTANEOUS | Status: DC
  Administered 2019-05-24 – 2019-05-25 (×2): 8 [IU] via SUBCUTANEOUS
  Filled 2019-05-24 (×3): qty 0.08

## 2019-05-24 NOTE — Plan of Care (Signed)
Continue current POC 

## 2019-05-24 NOTE — Progress Notes (Signed)
PROGRESS NOTE    Rodney Barber  NKN:[id number] DOB: 10-Jun-1966 DOA: 05/13/2019 PCP: Gildardo Pounds, NP    Brief Narrative:  53 year old male with history of insulin-dependent diabetes, hypertension, chronic pancreatitis, smoker, recent prolonged admission in the hospital complicated with initial suicidal ideation later cleared, deconditioning, now homeless presents to the emergency room with extreme weakness and leg cramps, EMS found with blood sugars of 500.  In the emergency room blood pressures elevated.  Potassium less than 2.  EKG with U waves.  Patient admitted to the hospital with hyperglycemia and severe hypokalemia as well UTI like presentation. Patient stated that he is out of insulin for more than a week.  Patient had diarrhea secondary to taking lactulose at home for high ammonia.  After admission, his urine culture grew MSSA, was on Rocephin, he had left-sided neck abscess, that also grew MSSA.  Blood cultures negative, however it was drawn after being on Rocephin.   Assessment & Plan:   Principal Problem:   Hypokalemia Active Problems:   Uncontrolled type 2 diabetes mellitus (HCC)   UTI (urinary tract infection)   Hyperglycemia   Tobacco use   Abnormal blood electrolyte level  Severe electrolyte abnormalities including hypokalemia and hypophosphatemia: Replaced aggressively with improvement.  Now with no more diarrhea.   MSSA UTI , MSSA left neck abscess and C-spine osteomyelitis:  Urine culture with MSSA, left neck aspirate with MSSA, blood cultures drawn after 2 days on Rocephin, no growth so far. Clinically stabilizing.  Remains on cefazolin.   TEE with no vegetation.  Plan for 6 weeks of IV antibiotics. ID recommended cefazolin until 11/18, reconsult before discharge for continuing oral antibiotics.  Brittle diabetes, hypoglycemic episodes, hyperglycemia, uncontrolled insulin-dependent diabetes: Last A1c was 17.7.  Patient is not using any insulin at home.   Episodic hypoglycemia.  Oral intake improving.  Blood sugars range from 49-400. Will change Lantus to twice a day, see if that helps. Start on prandial insulin.  Keeps on sliding scale insulin.  Hypertension: On amlodipine continue.  Depression: On Lexapro, continue.  Smoker: Counseled to quit.  Has nicotine patch.  Homelessness: Case management to help with medication and probably transition to shelter once medically stable. Anticipating patient may need prolonged hospitalization for intravenous antibiotics.   DVT prophylaxis: Lovenox subcu Code Status: DNR,  Family Communication: None Disposition Plan: Unknown at this time.  Tentative discharge date 06/25/2019.   Consultants:   Infectious disease  Procedures:   Left neck abscess aspiration  Antimicrobials:   Rocephin, 05/13/2019----05/15/2019  Vancomycin, 05/15/2019----05/17/2019  Ancef, 05/17/2019----    Subjective: Seen and examined.  No overnight events.    Objective: Vitals:   05/23/19 1412 05/23/19 2137 05/24/19 0415 05/24/19 0828  BP: 139/81 (!) 149/83 (!) 157/90 140/85  Pulse: 77 73 80 76  Resp: 20 16 17 19   Temp: 98.5 F (36.9 C) 98 F (36.7 C) 98.8 F (37.1 C) 98 F (36.7 C)  TempSrc: Oral Oral Oral Oral  SpO2: 100% 100% 99% 99%  Weight:      Height:        Intake/Output Summary (Last 24 hours) at 05/24/2019 1055 Last data filed at 05/24/2019 0829 Gross per 24 hour  Intake 797 ml  Output -  Net 797 ml   Filed Weights   05/14/19 0240 05/20/19 1221  Weight: 46.4 kg 46.4 kg    Examination:  General exam: Appears calm and comfortable.  Not in any distress.  On room air. Respiratory system: Clear to auscultation. Respiratory  effort normal. Cardiovascular system: S1 & S2 heard, RRR. No JVD, murmurs, rubs, gallops or clicks. No pedal edema. Gastrointestinal system: Abdomen is nondistended, soft and nontender. No organomegaly or masses felt. Normal bowel sounds heard. Central nervous system:  Alert and oriented. No focal neurological deficits. Extremities: Symmetric 5 x 5 power. Skin: No rashes, lesions or ulcers Psychiatry: Judgement and insight appear normal. Mood & affect appropriate.    Data Reviewed: I have personally reviewed following labs and imaging studies  CBC: Recent Labs  Lab 05/19/19 0454  WBC 7.0  NEUTROABS 4.5  HGB 9.8*  HCT 30.9*  MCV 94.2  PLT 306   Basic Metabolic Panel: Recent Labs  Lab 05/19/19 0454  NA 136  K 4.3  CL 99  CO2 29  GLUCOSE 169*  BUN 16  CREATININE 0.63  CALCIUM 8.4*  MG 1.8  PHOS 3.8   GFR: Estimated Creatinine Clearance: 69.3 mL/min (by C-G formula based on SCr of 0.63 mg/dL). Liver Function Tests: Recent Labs  Lab 05/19/19 0454  ALBUMIN 2.3*   No results for input(s): LIPASE, AMYLASE in the last 168 hours. No results for input(s): AMMONIA in the last 168 hours. Coagulation Profile: No results for input(s): INR, PROTIME in the last 168 hours. Cardiac Enzymes: No results for input(s): CKTOTAL, CKMB, CKMBINDEX, TROPONINI in the last 168 hours. BNP (last 3 results) No results for input(s): PROBNP in the last 8760 hours. HbA1C: No results for input(s): HGBA1C in the last 72 hours. CBG: Recent Labs  Lab 05/23/19 0809 05/23/19 1135 05/23/19 1659 05/23/19 2138 05/24/19 0720  GLUCAP 106* 210* 237* 279* 373*   Lipid Profile: No results for input(s): CHOL, HDL, LDLCALC, TRIG, CHOLHDL, LDLDIRECT in the last 72 hours. Thyroid Function Tests: No results for input(s): TSH, T4TOTAL, FREET4, T3FREE, THYROIDAB in the last 72 hours. Anemia Panel: No results for input(s): VITAMINB12, FOLATE, FERRITIN, TIBC, IRON, RETICCTPCT in the last 72 hours. Sepsis Labs: No results for input(s): PROCALCITON, LATICACIDVEN in the last 168 hours.  Recent Results (from the past 240 hour(s))  Culture, blood (routine x 2)     Status: None   Collection Time: 05/15/19 12:19 PM   Specimen: BLOOD LEFT HAND  Result Value Ref Range  Status   Specimen Description   Final    BLOOD LEFT HAND Performed at [facility], [address], [place], [place] [address]    Special Requests   Final    BOTTLES DRAWN AEROBIC AND ANAEROBIC Blood Culture adequate volume Performed at [facility], [address] [place], [place] [address]    Culture   Final    NO GROWTH 5 DAYS Performed at [facility], [address], [place], [place] [address]    Report Status 05/20/2019 FINAL  Final  Culture, blood (routine x 2)     Status: None   Collection Time: 05/15/19 12:25 PM   Specimen: BLOOD RIGHT ARM  Result Value Ref Range Status   Specimen Description   Final    BLOOD RIGHT ARM Performed at [place] [facility], [address] [place], [place] [address]    Special Requests   Final    BOTTLES DRAWN AEROBIC ONLY Blood Culture adequate volume Performed at [facility], [address], [place], [place] [address]    Culture   Final    NO GROWTH 5 DAYS Performed at [facility], [address] [place], [place] [address]    Report Status 05/20/2019  FINAL  Final  Aerobic/Anaerobic Culture (surgical/deep wound)     Status: None   Collection Time: 05/16/19  4:45 PM   Specimen: Abscess  Result Value Ref Range Status   Specimen Description   Final    ABSCESS LEFT NECK Performed at [facility], [address], [place], [place] [address]    Special Requests   Final    NONE Performed at [facility], [address] [place], [place] [address]    Gram Stain   Final    ABUNDANT WBC PRESENT, PREDOMINANTLY PMN MODERATE GRAM POSITIVE COCCI IN CLUSTERS    Culture   Final    ABUNDANT STAPHYLOCOCCUS AUREUS NO ANAEROBES ISOLATED Performed at [facility], [address], [place], [place] [address]    Report Status 05/21/2019 FINAL  Final   Organism ID, Bacteria STAPHYLOCOCCUS AUREUS  Final      Susceptibility    Staphylococcus aureus - MIC*    CIPROFLOXACIN <=0.5 SENSITIVE Sensitive     ERYTHROMYCIN <=0.25 SENSITIVE Sensitive     GENTAMICIN <=0.5 SENSITIVE Sensitive     OXACILLIN 0.5 SENSITIVE Sensitive     TETRACYCLINE <=1 SENSITIVE Sensitive     VANCOMYCIN <=0.5 SENSITIVE Sensitive     TRIMETH/SULFA <=10 SENSITIVE Sensitive     CLINDAMYCIN <=0.25 SENSITIVE Sensitive     RIFAMPIN <=0.5 SENSITIVE Sensitive     Inducible Clindamycin NEGATIVE Sensitive     * ABUNDANT STAPHYLOCOCCUS AUREUS         Radiology Studies: No results found.      Scheduled Meds: . amLODipine  5 mg Oral Daily  . atorvastatin  20 mg Oral q1800  . escitalopram  5 mg Oral Daily  . feeding supplement (GLUCERNA SHAKE)  237 mL Oral BID BM  . feeding supplement (PRO-STAT SUGAR FREE 64)  30 mL Oral BID  . folic acid  1 mg Oral Daily  . influenza vac split quadrivalent PF  0.5 mL Intramuscular Tomorrow-1000  . insulin aspart  0-5 Units Subcutaneous QHS  . insulin aspart  0-9 Units Subcutaneous TID WC  . insulin aspart  4 Units Subcutaneous TID WC  . insulin glargine  10 Units Subcutaneous Daily  . lipase/protease/amylase  12,000 Units Oral TID WC  . lisinopril  10 mg Oral Daily  . magnesium oxide  400 mg Oral BID  . multivitamin with minerals  1 tablet Oral Daily  . nicotine  14 mg Transdermal Daily  . thiamine  100 mg Oral Daily   Continuous Infusions: .  ceFAZolin (ANCEF) IV 2 g (05/24/19 0545)     LOS: 10 days    Time spent: 20 minutes    Dorcas Carrow, MD [facility] Pager [phone]  If 7PM-7AM, please contact night-coverage [email or web address] Password [email or web address] 05/24/2019, 10:55 AM

## 2019-05-25 LAB — GLUCOSE, CAPILLARY
Glucose-Capillary: 53 mg/dL — ABNORMAL LOW (ref 70–99)
Glucose-Capillary: 80 mg/dL (ref 70–99)
Glucose-Capillary: 109 mg/dL — ABNORMAL HIGH (ref 70–99)
Glucose-Capillary: 173 mg/dL — ABNORMAL HIGH (ref 70–99)
Glucose-Capillary: 107 mg/dL — ABNORMAL HIGH (ref 70–99)

## 2019-05-25 MED ORDER — INSULIN GLARGINE 100 UNIT/ML ~~LOC~~ SOLN
6.0000 [IU] | Freq: Two times a day (BID) | SUBCUTANEOUS | Status: DC
  Administered 2019-05-25 – 2019-06-05 (×22): 6 [IU] via SUBCUTANEOUS
  Filled 2019-05-25 (×24): qty 0.06

## 2019-05-25 NOTE — Progress Notes (Signed)
PROGRESS NOTE    Rodney Barber  EPP:[id number] DOB: 1965-08-22 DOA: 05/13/2019 PCP: [name], NP    Brief Narrative:  53 year old male with history of insulin-dependent diabetes, hypertension, chronic pancreatitis, smoker, recent prolonged admission in the hospital complicated with initial suicidal ideation later cleared, deconditioning, now homeless presents to the emergency room with extreme weakness and leg cramps, EMS found with blood sugars of 500.  In the emergency room blood pressures elevated.  Potassium less than 2.  EKG with U waves.  Patient admitted to the hospital with hyperglycemia and severe hypokalemia as well UTI like presentation. Patient stated that he is out of insulin for more than a week.  Patient had diarrhea secondary to taking lactulose at home for high ammonia.  After admission, his urine culture grew MSSA, was on Rocephin, he had left-sided neck abscess, that also grew MSSA.  Blood cultures negative, however it was drawn after being on Rocephin.   Assessment & Plan:   Principal Problem:   Hypokalemia Active Problems:   Uncontrolled type 2 diabetes mellitus (HCC)   UTI (urinary tract infection)   Hyperglycemia   Tobacco use   Abnormal blood electrolyte level  Severe electrolyte abnormalities including hypokalemia and hypophosphatemia: Replaced aggressively with improvement.  Now with no more diarrhea.  We will check his electrolytes including magnesium and phosphorus in the morning labs.  MSSA UTI , MSSA left neck abscess and C-spine osteomyelitis:  Urine culture with MSSA, left neck aspirate with MSSA, blood cultures drawn after 2 days on Rocephin, no growth so far. Clinically stabilizing.  Remains on cefazolin.   TEE with no vegetation.  Plan for 6 weeks of IV antibiotics. ID recommended cefazolin until 11/18, reconsult before discharge for continuing oral antibiotics.  Brittle diabetes, hypoglycemic episodes, hyperglycemia, uncontrolled  insulin-dependent diabetes: Last A1c was 17.  His blood sugars are very unpredictable. On low-carb diet. Hypoglycemic episodes with minimum insulin at night. We will change to twice a day Levemir to see if that helps. Continue prandial insulin.  Hypertension: On amlodipine continue.  Depression: On Lexapro, continue.  Smoker: Counseled to quit.  Has nicotine patch.  Homelessness: Case management to help with medication and probably transition to shelter once medically stable. Anticipating patient may need prolonged hospitalization for intravenous antibiotics.   DVT prophylaxis: Lovenox subcu Code Status: DNR,  Family Communication: None Disposition Plan: Unknown at this time.  Tentative discharge date 06/25/2019.   Consultants:   Infectious disease  Procedures:   Left neck abscess aspiration  Antimicrobials:   Rocephin, 05/13/2019----05/15/2019  Vancomycin, 05/15/2019----05/17/2019  Ancef, 05/17/2019----    Subjective: Seen and examined.  No overnight events.  Blood sugar was 53 in the morning, he did not have any symptoms.  He ate his breakfast.  Objective: Vitals:   05/24/19 0828 05/24/19 1334 05/24/19 2144 05/25/19 0555  BP: 140/85 125/72 139/80 118/70  Pulse: 76 80 75 62  Resp: 19 18 16 18   Temp: 98 F (36.7 C) 99.1 F (37.3 C) 99 F (37.2 C) 98.3 F (36.8 C)  TempSrc: Oral Oral Oral Oral  SpO2: 99% 97% 95% 98%  Weight:      Height:        Intake/Output Summary (Last 24 hours) at 05/25/2019 1053 Last data filed at 05/24/2019 1815 Gross per 24 hour  Intake 600 ml  Output -  Net 600 ml   Filed Weights   05/14/19 0240 05/20/19 1221  Weight: 46.4 kg 46.4 kg    Examination:  General exam: Appears  calm and comfortable.  Not in any distress.  On room air. Respiratory system: Clear to auscultation. Respiratory effort normal. Cardiovascular system: S1 & S2 heard, RRR. No JVD, murmurs, rubs, gallops or clicks. No pedal edema. Gastrointestinal system:  Abdomen is nondistended, soft and nontender. No organomegaly or masses felt. Normal bowel sounds heard. Central nervous system: Alert and oriented. No focal neurological deficits. Extremities: Symmetric 5 x 5 power. Skin: No rashes, lesions or ulcers Psychiatry: Judgement and insight appear normal. Mood & affect appropriate.    Data Reviewed: I have personally reviewed following labs and imaging studies  CBC: Recent Labs  Lab 05/19/19 0454  WBC 7.0  NEUTROABS 4.5  HGB 9.8*  HCT 30.9*  MCV 94.2  PLT 306   Basic Metabolic Panel: Recent Labs  Lab 05/19/19 0454  NA 136  K 4.3  CL 99  CO2 29  GLUCOSE 169*  BUN 16  CREATININE 0.63  CALCIUM 8.4*  MG 1.8  PHOS 3.8   GFR: Estimated Creatinine Clearance: 69.3 mL/min (by C-G formula based on SCr of 0.63 mg/dL). Liver Function Tests: Recent Labs  Lab 05/19/19 0454  ALBUMIN 2.3*   No results for input(s): LIPASE, AMYLASE in the last 168 hours. No results for input(s): AMMONIA in the last 168 hours. Coagulation Profile: No results for input(s): INR, PROTIME in the last 168 hours. Cardiac Enzymes: No results for input(s): CKTOTAL, CKMB, CKMBINDEX, TROPONINI in the last 168 hours. BNP (last 3 results) No results for input(s): PROBNP in the last 8760 hours. HbA1C: No results for input(s): HGBA1C in the last 72 hours. CBG: Recent Labs  Lab 05/24/19 1122 05/24/19 1659 05/24/19 2142 05/25/19 0725 05/25/19 0756  GLUCAP 225* 195* 152* 53* 80   Lipid Profile: No results for input(s): CHOL, HDL, LDLCALC, TRIG, CHOLHDL, LDLDIRECT in the last 72 hours. Thyroid Function Tests: No results for input(s): TSH, T4TOTAL, FREET4, T3FREE, THYROIDAB in the last 72 hours. Anemia Panel: No results for input(s): VITAMINB12, FOLATE, FERRITIN, TIBC, IRON, RETICCTPCT in the last 72 hours. Sepsis Labs: No results for input(s): PROCALCITON, LATICACIDVEN in the last 168 hours.  Recent Results (from the past 240 hour(s))  Culture, blood  (routine x 2)     Status: None   Collection Time: 05/15/19 12:19 PM   Specimen: BLOOD LEFT HAND  Result Value Ref Range Status   Specimen Description   Final    BLOOD LEFT HAND Performed at [facility], [address] [place], [place] [address]    Special Requests   Final    BOTTLES DRAWN AEROBIC AND ANAEROBIC Blood Culture adequate volume Performed at [facility], [address], [place], [place] [address]    Culture   Final    NO GROWTH 5 DAYS Performed at [facility], [address] [place], [place] [address]    Report Status 05/20/2019 FINAL  Final  Culture, blood (routine x 2)     Status: None   Collection Time: 05/15/19 12:25 PM   Specimen: BLOOD RIGHT ARM  Result Value Ref Range Status   Specimen Description   Final    BLOOD RIGHT ARM Performed at [facility], [address] [place], [place] [address]    Special Requests   Final    BOTTLES DRAWN AEROBIC ONLY Blood Culture adequate volume Performed at [facility], [address] [place], [place] [address]    Culture   Final    NO GROWTH 5 DAYS Performed  at [facility], [place] [address] [place], [place] [address]    Report Status 05/20/2019 FINAL  Final  Aerobic/Anaerobic Culture (surgical/deep wound)     Status: None   Collection Time: 05/16/19  4:45 PM   Specimen: Abscess  Result Value Ref Range Status   Specimen Description   Final    ABSCESS LEFT NECK Performed at [facility] [address] [place], [place] [address]    Special Requests   Final    NONE Performed at [facility], [place] [address] [place], [place] [address]    Gram Stain   Final    ABUNDANT WBC PRESENT, PREDOMINANTLY PMN MODERATE GRAM POSITIVE COCCI IN CLUSTERS    Culture   Final    ABUNDANT STAPHYLOCOCCUS AUREUS NO ANAEROBES ISOLATED Performed at [facility], [place] [address], [place], [place]  [address]    Report Status 05/21/2019 FINAL  Final   Organism ID, Bacteria STAPHYLOCOCCUS AUREUS  Final      Susceptibility   Staphylococcus aureus - MIC*    CIPROFLOXACIN <=0.5 SENSITIVE Sensitive     ERYTHROMYCIN <=0.25 SENSITIVE Sensitive     GENTAMICIN <=0.5 SENSITIVE Sensitive     OXACILLIN 0.5 SENSITIVE Sensitive     TETRACYCLINE <=1 SENSITIVE Sensitive     VANCOMYCIN <=0.5 SENSITIVE Sensitive     TRIMETH/SULFA <=10 SENSITIVE Sensitive     CLINDAMYCIN <=0.25 SENSITIVE Sensitive     RIFAMPIN <=0.5 SENSITIVE Sensitive     Inducible Clindamycin NEGATIVE Sensitive     * ABUNDANT STAPHYLOCOCCUS AUREUS         Radiology Studies: No results found.      Scheduled Meds: . amLODipine  5 mg Oral Daily  . atorvastatin  20 mg Oral q1800  . escitalopram  5 mg Oral Daily  . feeding supplement (GLUCERNA SHAKE)  237 mL Oral BID BM  . feeding supplement (PRO-STAT SUGAR FREE 64)  30 mL Oral BID  . folic acid  1 mg Oral Daily  . influenza vac split quadrivalent PF  0.5 mL Intramuscular Tomorrow-1000  . insulin aspart  0-5 Units Subcutaneous QHS  . insulin aspart  0-9 Units Subcutaneous TID WC  . insulin aspart  4 Units Subcutaneous TID WC  . insulin glargine  6 Units Subcutaneous BID  . lipase/protease/amylase  12,000 Units Oral TID WC  . lisinopril  10 mg Oral Daily  . magnesium oxide  400 mg Oral BID  . multivitamin with minerals  1 tablet Oral Daily  . nicotine  14 mg Transdermal Daily  . thiamine  100 mg Oral Daily   Continuous Infusions: .  ceFAZolin (ANCEF) IV 2 g (05/25/19 0554)     LOS: 11 days    Time spent: 20 minutes    Barb Merino, MD [facility] Pager [phone]  If 7PM-7AM, please contact night-coverage [email or web address] Password [email or web address] 05/25/2019, 10:53 AM

## 2019-05-25 NOTE — Progress Notes (Signed)
Occupational Therapy Treatment Patient Details Name: Rodney Barber MRN: [id number] DOB: Sep 28, 1965 Today's Date: 05/25/2019    History of present illness 53 year old male with history of insulin-dependent diabetes, hypertension, chronic pancreatitis, smoker, recent prolonged admission in the hospital complicated with initial suicidal ideation later cleared, deconditioning, now homeless presents to the emergency room with extreme weakness and leg cramps, EMS found with blood sugars of 500,severe hypokalemia, hypophosphatemia, and MSSA UTI , MSSA left neck abscess and C-spine osteomyelitis   OT comments  Pt willing to work with OT.  Pt motivated and pleasant during OT session. Pt was frustrated breakfast was late  Follow Up Recommendations  No OT follow up    Equipment Recommendations  None recommended by OT    Recommendations for Other Services      Precautions / Restrictions Precautions Precautions: Fall       Mobility Bed Mobility Overal bed mobility: Independent                Transfers Overall transfer level: Needs assistance Equipment used: Straight cane Transfers: Sit to/from Stand Sit to Stand: Min guard         General transfer comment: for safety    Balance Overall balance assessment: Mild deficits observed, not formally tested   Sitting balance-Leahy Scale: Good     Standing balance support: No upper extremity supported Standing balance-Leahy Scale: Fair                             ADL either performed or assessed with clinical judgement   ADL Overall ADL's : Needs assistance/impaired     Grooming: Set up;Supervision/safety;Standing                   Toilet Transfer: Supervision/safety;Ambulation;Regular Toilet;Grab bars   Toileting- Clothing Manipulation and Hygiene: Sit to/from stand;Supervision/safety       Functional mobility during ADLs: Min guard;Cane;Cueing for safety General ADL Comments: Pt utilized cane  this session. min guard for mobilizing in the room.     Vision Patient Visual Report: No change from baseline            Cognition Arousal/Alertness: Awake/alert Behavior During Therapy: WFL for tasks assessed/performed Overall Cognitive Status: Within Functional Limits for tasks assessed                                                     Pertinent Vitals/ Pain       Pain Assessment: No/denies pain     Prior Functioning/Environment              Frequency  Min 2X/week        Progress Toward Goals  OT Goals(current goals can now be found in the care plan section)     Acute Rehab OT Goals Patient Stated Goal: move better OT Goal Formulation: With patient Time For Goal Achievement: 05/28/19 Potential to Achieve Goals: Good  Plan Discharge plan remains appropriate       AM-PAC OT "6 Clicks" Daily Activity     Outcome Measure   Help from another person eating meals?: None Help from another person taking care of personal grooming?: None Help from another person toileting, which includes using toliet, bedpan, or urinal?: A Little Help from another person bathing (including washing, rinsing, drying)?: A Little Help  from another person to put on and taking off regular upper body clothing?: None Help from another person to put on and taking off regular lower body clothing?: A Little 6 Click Score: 21    End of Session Equipment Utilized During Treatment: Other (comment)(cane)  OT Visit Diagnosis: Unsteadiness on feet (R26.81)   Activity Tolerance Patient tolerated treatment well   Patient Left with call bell/phone within reach;in chair;with chair alarm set   Nurse Communication          Time: 3716-9678 OT Time Calculation (min): 17 min  Charges: OT General Charges $OT Visit: 1 Visit OT Treatments $Self Care/Home Management : 8-22 mins  Kari Baars, [place] [phone] Office- [place], Edwena Felty D 05/25/2019, 2:22 PM

## 2019-05-25 NOTE — Plan of Care (Signed)
Patient lying in bed this morning; pain controlled at this time. No concerns voiced at this time. Will continue to monitor.

## 2019-05-26 LAB — GLUCOSE, CAPILLARY
Glucose-Capillary: 104 mg/dL — ABNORMAL HIGH (ref 70–99)
Glucose-Capillary: 73 mg/dL (ref 70–99)
Glucose-Capillary: 132 mg/dL — ABNORMAL HIGH (ref 70–99)
Glucose-Capillary: 154 mg/dL — ABNORMAL HIGH (ref 70–99)
Glucose-Capillary: 101 mg/dL — ABNORMAL HIGH (ref 70–99)

## 2019-05-26 LAB — PHOSPHORUS: Phosphorus: 4.5 mg/dL (ref 2.5–4.6)

## 2019-05-26 LAB — CBC WITH DIFFERENTIAL/PLATELET
Abs Immature Granulocytes: 0.04 10*3/uL (ref 0.00–0.07)
Basophils Absolute: 0.1 10*3/uL (ref 0.0–0.1)
Basophils Relative: 1 %
Eosinophils Absolute: 0.2 10*3/uL (ref 0.0–0.5)
Eosinophils Relative: 2 %
HCT: 28.4 % — ABNORMAL LOW (ref 39.0–52.0)
Hemoglobin: 8.7 g/dL — ABNORMAL LOW (ref 13.0–17.0)
Immature Granulocytes: 1 %
Lymphocytes Relative: 28 %
Lymphs Abs: 2.1 10*3/uL (ref 0.7–4.0)
MCH: 30.3 pg (ref 26.0–34.0)
MCHC: 30.6 g/dL (ref 30.0–36.0)
MCV: 99 fL (ref 80.0–100.0)
Monocytes Absolute: 0.4 10*3/uL (ref 0.1–1.0)
Monocytes Relative: 5 %
Neutro Abs: 4.7 10*3/uL (ref 1.7–7.7)
Neutrophils Relative %: 63 %
Platelets: 289 10*3/uL (ref 150–400)
RBC: 2.87 MIL/uL — ABNORMAL LOW (ref 4.22–5.81)
RDW: 12.8 % (ref 11.5–15.5)
WBC: 7.5 10*3/uL (ref 4.0–10.5)
nRBC: 0 % (ref 0.0–0.2)

## 2019-05-26 LAB — BASIC METABOLIC PANEL
Anion gap: 5 (ref 5–15)
BUN: 21 mg/dL — ABNORMAL HIGH (ref 6–20)
CO2: 27 mmol/L (ref 22–32)
Calcium: 8.5 mg/dL — ABNORMAL LOW (ref 8.9–10.3)
Chloride: 105 mmol/L (ref 98–111)
Creatinine, Ser: 0.63 mg/dL (ref 0.61–1.24)
GFR calc Af Amer: >60 mL/min (ref >60)
GFR calc non Af Amer: >60 mL/min (ref >60)
Glucose, Bld: 135 mg/dL — ABNORMAL HIGH (ref 70–99)
Potassium: 4.3 mmol/L (ref 3.5–5.1)
Sodium: 137 mmol/L (ref 135–145)

## 2019-05-26 LAB — MAGNESIUM: Magnesium: 1.9 mg/dL (ref 1.7–2.4)

## 2019-05-26 NOTE — Progress Notes (Signed)
PROGRESS NOTE    Rodney Barber  DJM:[id number] DOB: 09-21-1965 DOA: 05/13/2019 PCP: [name], NP    Brief Narrative:  53 year old male with history of insulin-dependent diabetes, hypertension, chronic pancreatitis, smoker, recent prolonged admission in the hospital complicated with initial suicidal ideation later cleared, deconditioning, now homeless presents to the emergency room with extreme weakness and leg cramps, EMS found with blood sugars of 500.  In the emergency room blood pressures elevated.  Potassium less than 2.  EKG with U waves.  Patient admitted to the hospital with hyperglycemia and severe hypokalemia as well UTI like presentation. Patient stated that he is out of insulin for more than a week.  Patient had diarrhea secondary to taking lactulose at home for high ammonia.  After admission, his urine culture grew MSSA, was on Rocephin, he had left-sided neck abscess, that also grew MSSA.  Blood cultures negative, however it was drawn after being on Rocephin.   Assessment & Plan:   Principal Problem:   Hypokalemia Active Problems:   Uncontrolled type 2 diabetes mellitus (HCC)   UTI (urinary tract infection)   Hyperglycemia   Tobacco use   Abnormal blood electrolyte level  Severe electrolyte abnormalities including hypokalemia and hypophosphatemia: Replaced aggressively with improvement.  Now with no more diarrhea.  Improved and normalized.  Clinical symptoms also improved.  MSSA UTI , MSSA left neck abscess and C-spine osteomyelitis:  Urine culture with MSSA, left neck aspirate with MSSA, blood cultures drawn after 2 days on Rocephin, no growth so far. Clinically stabilizing.  Remains on cefazolin.   TEE with no vegetation.  Plan for 6 weeks of IV antibiotics. ID recommended cefazolin until 11/18, reconsult before discharge for continuing oral antibiotics.  Brittle diabetes, hypoglycemic episodes, hyperglycemia, uncontrolled insulin-dependent diabetes: Last A1c  was 17.  His blood sugars are very unpredictable. On low-carb diet. Hypoglycemic episodes with minimum insulin at night. Changed to twice a day long-acting insulin with improvement of blood sugar levels and avoiding hypoglycemic episodes. Continue prandial insulin.  Hypertension: On amlodipine continue.  Depression: On Lexapro, continue.  Smoker: Counseled to quit.  Has nicotine patch.  Homelessness: Case management to help with medication and probably transition to shelter once medically stable. Anticipating patient may need prolonged hospitalization for intravenous antibiotics.   DVT prophylaxis: Lovenox subcu Code Status: DNR,  Family Communication: None Disposition Plan: Unknown at this time.  Tentative discharge date 06/25/2019.   Consultants:   Infectious disease  Procedures:   Left neck abscess aspiration  Antimicrobials:   Rocephin, 05/13/2019----05/15/2019  Vancomycin, 05/15/2019----05/17/2019  Ancef, 05/17/2019----    Subjective: Seen and examined.  No overnight events.  Blood sugars better now.  No hypoglycemic episodes. Objective: Vitals:   05/25/19 0555 05/25/19 1451 05/25/19 2124 05/26/19 0532  BP: 118/70 124/71 131/76 130/76  Pulse: 62 68 74 70  Resp: 18  18 16   Temp: 98.3 F (36.8 C) 97.9 F (36.6 C) 98.8 F (37.1 C) 98.6 F (37 C)  TempSrc: Oral Oral Oral Oral  SpO2: 98% 99% 96% 98%  Weight:      Height:        Intake/Output Summary (Last 24 hours) at 05/26/2019 1152 Last data filed at 05/25/2019 2300 Gross per 24 hour  Intake 407.7 ml  Output -  Net 407.7 ml   Filed Weights   05/14/19 0240 05/20/19 1221  Weight: 46.4 kg 46.4 kg    Examination:  General exam: Appears calm and comfortable.  Not in any distress.  On room air.  Respiratory system: Clear to auscultation. Respiratory effort normal. Cardiovascular system: S1 & S2 heard, RRR. No JVD, murmurs, rubs, gallops or clicks. No pedal edema. Gastrointestinal system: Abdomen is  nondistended, soft and nontender. No organomegaly or masses felt. Normal bowel sounds heard. Central nervous system: Alert and oriented. No focal neurological deficits. Extremities: Symmetric 5 x 5 power. Skin: No rashes, lesions or ulcers Psychiatry: Judgement and insight appear normal. Mood & affect appropriate.    Data Reviewed: I have personally reviewed following labs and imaging studies  CBC: Recent Labs  Lab 05/26/19 0432  WBC 7.5  NEUTROABS 4.7  HGB 8.7*  HCT 28.4*  MCV 99.0  PLT 289   Basic Metabolic Panel: Recent Labs  Lab 05/26/19 0432  NA 137  K 4.3  CL 105  CO2 27  GLUCOSE 135*  BUN 21*  CREATININE 0.63  CALCIUM 8.5*  MG 1.9  PHOS 4.5   GFR: Estimated Creatinine Clearance: 69.3 mL/min (by C-G formula based on SCr of 0.63 mg/dL). Liver Function Tests: No results for input(s): AST, ALT, ALKPHOS, BILITOT, PROT, ALBUMIN in the last 168 hours. No results for input(s): LIPASE, AMYLASE in the last 168 hours. No results for input(s): AMMONIA in the last 168 hours. Coagulation Profile: No results for input(s): INR, PROTIME in the last 168 hours. Cardiac Enzymes: No results for input(s): CKTOTAL, CKMB, CKMBINDEX, TROPONINI in the last 168 hours. BNP (last 3 results) No results for input(s): PROBNP in the last 8760 hours. HbA1C: No results for input(s): HGBA1C in the last 72 hours. CBG: Recent Labs  Lab 05/25/19 0756 05/25/19 1147 05/25/19 1624 05/25/19 2122 05/26/19 0721  GLUCAP 80 109* 173* 107* 104*   Lipid Profile: No results for input(s): CHOL, HDL, LDLCALC, TRIG, CHOLHDL, LDLDIRECT in the last 72 hours. Thyroid Function Tests: No results for input(s): TSH, T4TOTAL, FREET4, T3FREE, THYROIDAB in the last 72 hours. Anemia Panel: No results for input(s): VITAMINB12, FOLATE, FERRITIN, TIBC, IRON, RETICCTPCT in the last 72 hours. Sepsis Labs: No results for input(s): PROCALCITON, LATICACIDVEN in the last 168 hours.  Recent Results (from the past  240 hour(s))  Aerobic/Anaerobic Culture (surgical/deep wound)     Status: None   Collection Time: 05/16/19  4:45 PM   Specimen: Abscess  Result Value Ref Range Status   Specimen Description   Final    ABSCESS LEFT NECK Performed at [facility], [address] [place], [place] [address]    Special Requests   Final    NONE Performed at [facility], [address] [place], [place] [address]    Gram Stain   Final    ABUNDANT WBC PRESENT, PREDOMINANTLY PMN MODERATE GRAM POSITIVE COCCI IN CLUSTERS    Culture   Final    ABUNDANT STAPHYLOCOCCUS AUREUS NO ANAEROBES ISOLATED Performed at [facility], [address], [place], [place] [address]    Report Status 05/21/2019 FINAL  Final   Organism ID, Bacteria STAPHYLOCOCCUS AUREUS  Final      Susceptibility   Staphylococcus aureus - MIC*    CIPROFLOXACIN <=0.5 SENSITIVE Sensitive     ERYTHROMYCIN <=0.25 SENSITIVE Sensitive     GENTAMICIN <=0.5 SENSITIVE Sensitive     OXACILLIN 0.5 SENSITIVE Sensitive     TETRACYCLINE <=1 SENSITIVE Sensitive     VANCOMYCIN <=0.5 SENSITIVE Sensitive     TRIMETH/SULFA <=10 SENSITIVE Sensitive     CLINDAMYCIN <=0.25 SENSITIVE Sensitive     RIFAMPIN <=0.5 SENSITIVE Sensitive     Inducible Clindamycin  NEGATIVE Sensitive     * ABUNDANT STAPHYLOCOCCUS AUREUS         Radiology Studies: No results found.      Scheduled Meds: . amLODipine  5 mg Oral Daily  . atorvastatin  20 mg Oral q1800  . escitalopram  5 mg Oral Daily  . feeding supplement (GLUCERNA SHAKE)  237 mL Oral BID BM  . feeding supplement (PRO-STAT SUGAR FREE 64)  30 mL Oral BID  . folic acid  1 mg Oral Daily  . influenza vac split quadrivalent PF  0.5 mL Intramuscular Tomorrow-1000  . insulin aspart  0-5 Units Subcutaneous QHS  . insulin aspart  0-9 Units Subcutaneous TID WC  . insulin aspart  4 Units Subcutaneous TID WC  . insulin glargine  6 Units Subcutaneous BID  .  lipase/protease/amylase  12,000 Units Oral TID WC  . lisinopril  10 mg Oral Daily  . magnesium oxide  400 mg Oral BID  . multivitamin with minerals  1 tablet Oral Daily  . nicotine  14 mg Transdermal Daily  . thiamine  100 mg Oral Daily   Continuous Infusions: .  ceFAZolin (ANCEF) IV 2 g (05/26/19 0529)     LOS: 12 days    Time spent: 20 minutes    Barb Merino, MD [facility] Pager [phone]  If 7PM-7AM, please contact night-coverage [email or web address] Password [email or web address] 05/26/2019, 11:52 AM

## 2019-05-26 NOTE — Plan of Care (Signed)
  Problem: Nutrition: Goal: Adequate nutrition will be maintained Outcome: Progressing   Problem: Pain Managment: Goal: General experience of comfort will improve Outcome: Progressing   Problem: Education: Goal: Knowledge of General Education information will improve Description: Including pain rating scale, medication(s)/side effects and non-pharmacologic comfort measures Outcome: Completed/Met   Problem: Activity: Goal: Risk for activity intolerance will decrease Outcome: Completed/Met

## 2019-05-27 LAB — GLUCOSE, CAPILLARY
Glucose-Capillary: 87 mg/dL (ref 70–99)
Glucose-Capillary: 226 mg/dL — ABNORMAL HIGH (ref 70–99)
Glucose-Capillary: 134 mg/dL — ABNORMAL HIGH (ref 70–99)
Glucose-Capillary: 179 mg/dL — ABNORMAL HIGH (ref 70–99)

## 2019-05-27 NOTE — Progress Notes (Signed)
Assumed care of patient from Mary Beth. Cont with plan of care. Condition stable. No changes in initial am assessment. 

## 2019-05-27 NOTE — Progress Notes (Signed)
Nutrition Follow-up  DOCUMENTATION CODES:   Underweight, Severe malnutrition in context of chronic illness  INTERVENTION:  - continue Glucerna Shake BID and 30 ml prostat BID.  - continue to encourage PO intakes. - will continue to monitor for additional nutrition-related needs.  * re-weigh patient today.    NUTRITION DIAGNOSIS:   Severe Malnutrition related to chronic illness as evidenced by severe fat depletion, severe muscle depletion. -ongoing  GOAL:   Patient will meet greater than or equal to 90% of their needs -met  MONITOR:   PO intake, Supplement acceptance, Labs, Weight trends  ASSESSMENT:   53 year old male with history of insulin-dependent DM, HTN, chronic pancreatitis, smoker, and recent prolonged hospitalization complicated by suicidal ideation later cleared, deconditioning, now homeless. He presented to the ED on 10/6 with extreme weakness and leg cramps. EMS found CBG to be >500 mg/dl and in the ED he was hypertensive. He was admitted for hyperglycemia and hypokalemia. Patient reported being out of insulin x1 week PTA and having diarrhea at home 2/2 taking lactulose.  Weight on 10/13 recorded as being identical to weight on 10/7. Per chart review, most recently documented intakes were 100% of lunch on 10/15 (668 kcal, 37 grams protein); 100% of all meals on 10/17 (total of 2604 kcal, 107 grams protein). Ensure Enlive BID was changed to Glucerna Shake BID on 10/15 and patient has accepted 7 of 8 bottles since that time. He has accepted 11 of 12 packets of prostat since order placed on 10/14.  Patient is homeless. MD note from this AM indicates plan for patient to remain inpatient for IV abx and tentative d/c date is 06/25/19.   Labs reviewed; CBG: 87 mg/dl, Ca: 8.5 mg/dl. Medications reviewed; 1 mg folvite/day, sliding scale novolog, 4 units novolog TID, 6 units lantus BID, 13244 units creon TID, 400 mg mag-ox BID, daily multivitamin with minerals, 100 mg  thiamine/day.    Diet Order:   Diet Order            Diet Carb Modified Fluid consistency: Thin; Room service appropriate? Yes  Diet effective now              EDUCATION NEEDS:   Not appropriate for education at this time  Skin:  Skin Assessment: Reviewed RN Assessment  Last BM:  10/18  Height:   Ht Readings from Last 1 Encounters:  05/20/19 5' 8"  (1.727 m)    Weight:   Wt Readings from Last 1 Encounters:  05/20/19 46.4 kg    Ideal Body Weight:  70 kg  BMI:  Body mass index is 15.57 kg/m.  Estimated Nutritional Needs:   Kcal:  1855-2090 kcal (40-45 kcal/kg)  Protein:  80-90 grams  Fluid:  >/= 2 L/day     Rodney Matin, MS, RD, LDN, [facility] Inpatient Clinical Dietitian Pager # [phone] After hours/weekend pager # [phone]

## 2019-05-27 NOTE — Progress Notes (Signed)
PROGRESS NOTE    Rodney Barber  PXT:[id number] DOB: [birth year] DOA: 05/13/2019 PCP: [name], NP    Brief Narrative:  53 year old male with history of insulin-dependent diabetes, hypertension, chronic pancreatitis, smoker, recent prolonged admission in the hospital complicated with initial suicidal ideation later cleared, deconditioning, now homeless presents to the emergency room with extreme weakness and leg cramps, EMS found with blood sugars of 500.  In the emergency room blood pressures elevated.  Potassium less than 2.  EKG with U waves.  Patient admitted to the hospital with hyperglycemia and severe hypokalemia as well UTI like presentation. Patient stated that he was out of insulin for more than a week.  Patient had diarrhea secondary to taking lactulose at home for high ammonia.  After admission, his urine culture grew MSSA, was on Rocephin, he had left-sided neck abscess, that also grew MSSA.  Blood cultures negative, however it was drawn after being on Rocephin. Remains in the hospital to finish antibiotics.   Assessment & Plan:   Principal Problem:   Hypokalemia Active Problems:   Uncontrolled type 2 diabetes mellitus (HCC)   UTI (urinary tract infection)   Hyperglycemia   Tobacco use   Abnormal blood electrolyte level  Severe electrolyte abnormalities including hypokalemia and hypophosphatemia: Replaced aggressively with improvement.  Now with no more diarrhea.  Improved and normalized.  Clinical symptoms also improved.  MSSA UTI , MSSA left neck abscess and C-spine osteomyelitis:  Urine culture with MSSA, left neck aspirate with MSSA, blood cultures drawn after 2 days on Rocephin, no growth so far. Clinically stabilizing.  Remains on cefazolin.  TEE with no vegetation.  Plan for 6 weeks of IV antibiotics. ID recommended cefazolin until 11/18, reconsult before discharge for continuing oral antibiotics.  Brittle diabetes, hypoglycemic episodes, hyperglycemia,  uncontrolled insulin-dependent diabetes: Last A1c was 17.  His blood sugars are very unpredictable. On low-carb diet. Hypoglycemic episodes with minimum insulin at night. Changed to twice a day long-acting insulin with improvement of blood sugar levels and avoiding hypoglycemic episodes. Continue prandial insulin.  Hypertension: On amlodipine continue.  Depression: On Lexapro, continue.  Smoker: Counseled to quit.  Has nicotine patch.  Homelessness: Case management to help with medication and probably transition to shelter once medically stable. Anticipating patient may need prolonged hospitalization for intravenous antibiotics.   DVT prophylaxis: Lovenox subcu Code Status: DNR,  Family Communication: None Disposition Plan: Unknown at this time.  Tentative discharge date 06/25/2019.   Consultants:   Infectious disease  Procedures:   Left neck abscess aspiration  Antimicrobials:   Rocephin, 05/13/2019----05/15/2019  Vancomycin, 05/15/2019----05/17/2019  Ancef, 05/17/2019----    Subjective: Seen and examined.  No overnight events.  Blood sugars better now.  No hypoglycemic episodes last 24 hours.  Objective: Vitals:   05/26/19 1244 05/26/19 1957 05/27/19 0400 05/27/19 0936  BP: 128/70 133/83 (!) 146/82 124/70  Pulse: 77 78 77   Resp:  20 16   Temp: 98 F (36.7 C) 98.4 F (36.9 C) 98.2 F (36.8 C)   TempSrc: Oral Oral Oral   SpO2: 99% 98% 99%   Weight:      Height:        Intake/Output Summary (Last 24 hours) at 05/27/2019 0953 Last data filed at 05/27/2019 0400 Gross per 24 hour  Intake 268.63 ml  Output -  Net 268.63 ml   Filed Weights   05/14/19 0240 05/20/19 1221  Weight: 46.4 kg 46.4 kg    Examination:  General exam: Appears calm and comfortable.  Not in any distress.  On room air. Respiratory system: Clear to auscultation. Respiratory effort normal. Cardiovascular system: S1 & S2 heard, RRR. No JVD, murmurs, rubs, gallops or clicks. No pedal  edema. Gastrointestinal system: Abdomen is nondistended, soft and nontender. No organomegaly or masses felt. Normal bowel sounds heard. Central nervous system: Alert and oriented. No focal neurological deficits. Extremities: Symmetric 5 x 5 power. Skin: No rashes, lesions or ulcers Psychiatry: Judgement and insight appear normal. Mood & affect appropriate.    Data Reviewed: I have personally reviewed following labs and imaging studies  CBC: Recent Labs  Lab 05/26/19 0432  WBC 7.5  NEUTROABS 4.7  HGB 8.7*  HCT 28.4*  MCV 99.0  PLT 355   Basic Metabolic Panel: Recent Labs  Lab 05/26/19 0432  NA 137  K 4.3  CL 105  CO2 27  GLUCOSE 135*  BUN 21*  CREATININE 0.63  CALCIUM 8.5*  MG 1.9  PHOS 4.5   GFR: Estimated Creatinine Clearance: 69.3 mL/min (by C-G formula based on SCr of 0.63 mg/dL). Liver Function Tests: No results for input(s): AST, ALT, ALKPHOS, BILITOT, PROT, ALBUMIN in the last 168 hours. No results for input(s): LIPASE, AMYLASE in the last 168 hours. No results for input(s): AMMONIA in the last 168 hours. Coagulation Profile: No results for input(s): INR, PROTIME in the last 168 hours. Cardiac Enzymes: No results for input(s): CKTOTAL, CKMB, CKMBINDEX, TROPONINI in the last 168 hours. BNP (last 3 results) No results for input(s): PROBNP in the last 8760 hours. HbA1C: No results for input(s): HGBA1C in the last 72 hours. CBG: Recent Labs  Lab 05/26/19 1210 05/26/19 1358 05/26/19 1640 05/26/19 1954 05/27/19 0731  GLUCAP 73 132* 154* 101* 87   Lipid Profile: No results for input(s): CHOL, HDL, LDLCALC, TRIG, CHOLHDL, LDLDIRECT in the last 72 hours. Thyroid Function Tests: No results for input(s): TSH, T4TOTAL, FREET4, T3FREE, THYROIDAB in the last 72 hours. Anemia Panel: No results for input(s): VITAMINB12, FOLATE, FERRITIN, TIBC, IRON, RETICCTPCT in the last 72 hours. Sepsis Labs: No results for input(s): PROCALCITON, LATICACIDVEN in the last  168 hours.  No results found for this or any previous visit (from the past 240 hour(s)).       Radiology Studies: No results found.      Scheduled Meds: . amLODipine  5 mg Oral Daily  . atorvastatin  20 mg Oral q1800  . escitalopram  5 mg Oral Daily  . feeding supplement (GLUCERNA SHAKE)  237 mL Oral BID BM  . feeding supplement (PRO-STAT SUGAR FREE 64)  30 mL Oral BID  . folic acid  1 mg Oral Daily  . influenza vac split quadrivalent PF  0.5 mL Intramuscular Tomorrow-1000  . insulin aspart  0-5 Units Subcutaneous QHS  . insulin aspart  0-9 Units Subcutaneous TID WC  . insulin aspart  4 Units Subcutaneous TID WC  . insulin glargine  6 Units Subcutaneous BID  . lipase/protease/amylase  12,000 Units Oral TID WC  . lisinopril  10 mg Oral Daily  . magnesium oxide  400 mg Oral BID  . multivitamin with minerals  1 tablet Oral Daily  . nicotine  14 mg Transdermal Daily  . thiamine  100 mg Oral Daily   Continuous Infusions: .  ceFAZolin (ANCEF) IV 2 g (05/27/19 0434)     LOS: 13 days    Time spent: 20 minutes    Barb Merino, MD [facility] Pager [phone]  If 7PM-7AM, please contact night-coverage [email or web address] Password [email or web address]  05/27/2019, 9:53 AM

## 2019-05-28 DIAGNOSIS — L0211 Cutaneous abscess of neck: Secondary | ICD-10-CM

## 2019-05-28 DIAGNOSIS — N3000 Acute cystitis without hematuria: Principal | ICD-10-CM

## 2019-05-28 DIAGNOSIS — E878 Other disorders of electrolyte and fluid balance, not elsewhere classified: Secondary | ICD-10-CM

## 2019-05-28 LAB — CBC
HCT: 28.3 % — ABNORMAL LOW (ref 39.0–52.0)
Hemoglobin: 8.8 g/dL — ABNORMAL LOW (ref 13.0–17.0)
MCH: 30.9 pg (ref 26.0–34.0)
MCHC: 31.1 g/dL (ref 30.0–36.0)
MCV: 99.3 fL (ref 80.0–100.0)
Platelets: 274 10*3/uL (ref 150–400)
RBC: 2.85 MIL/uL — ABNORMAL LOW (ref 4.22–5.81)
RDW: 13.2 % (ref 11.5–15.5)
WBC: 6.4 10*3/uL (ref 4.0–10.5)
nRBC: 0 % (ref 0.0–0.2)

## 2019-05-28 LAB — BASIC METABOLIC PANEL
Anion gap: 7 (ref 5–15)
BUN: 21 mg/dL — ABNORMAL HIGH (ref 6–20)
CO2: 25 mmol/L (ref 22–32)
Calcium: 8.4 mg/dL — ABNORMAL LOW (ref 8.9–10.3)
Chloride: 104 mmol/L (ref 98–111)
Creatinine, Ser: 0.6 mg/dL — ABNORMAL LOW (ref 0.61–1.24)
GFR calc Af Amer: >60 mL/min (ref >60)
GFR calc non Af Amer: >60 mL/min (ref >60)
Glucose, Bld: 151 mg/dL — ABNORMAL HIGH (ref 70–99)
Potassium: 4.3 mmol/L (ref 3.5–5.1)
Sodium: 136 mmol/L (ref 135–145)

## 2019-05-28 LAB — GLUCOSE, CAPILLARY
Glucose-Capillary: 129 mg/dL — ABNORMAL HIGH (ref 70–99)
Glucose-Capillary: 98 mg/dL (ref 70–99)
Glucose-Capillary: 61 mg/dL — ABNORMAL LOW (ref 70–99)
Glucose-Capillary: 70 mg/dL (ref 70–99)
Glucose-Capillary: 231 mg/dL — ABNORMAL HIGH (ref 70–99)

## 2019-05-28 MED ORDER — SODIUM CHLORIDE 0.9 % IV SOLN
INTRAVENOUS | Status: DC | PRN
  Administered 2019-05-28 – 2019-06-23 (×9): 250 mL via INTRAVENOUS

## 2019-05-28 NOTE — Progress Notes (Signed)
[facility]                                                                              Patient Demographics  Rodney Barber, is a 53 y.o. male, DOB - 1965-09-25, ZSW:[id number]  Admit date - 05/13/2019   Admitting Physician Rodney Giovanni, MD  Outpatient Primary MD for the patient is Rodney Rigg, NP  Outpatient specialists:   LOS - 14  days   Medical records reviewed and are as summarized below:    Chief Complaint  Patient presents with   Hyperglycemia   Weakness       Brief summary   53 year old male with history of insulin-dependent diabetes, hypertension, chronic pancreatitis, smoker, recent prolonged admission in the hospital complicated with initial suicidal ideation later cleared, deconditioning, now homeless presents to the emergency room with extreme weakness and leg cramps, EMS found with blood sugars of 500.  In the emergency room blood pressures elevated.  Potassium less than 2.  EKG with U waves.  Patient admitted to the hospital with hyperglycemia and severe hypokalemia as well UTI like presentation. Patient stated that he was out of insulin for more than a week.  Patient had diarrhea secondary to taking lactulose at home for high ammonia.  After admission, his urine culture grew MSSA, was on Rocephin, he had left-sided neck abscess, that also grew MSSA.  Blood cultures negative, however it was drawn after being on Rocephin. Remains in the hospital to finish antibiotics, until 06/25/2019 due to history of IV drug abuse, unsafe to discharge with PICC line    Assessment & Plan    Electrolyte abnormalities including profound hypokalemia, hypophosphatemia -At the time of admission potassium less than 2.0, phosphorus less than 1.0 -Aggressively replaced, potassium 4.3, phosphorus 4.5 -Diarrhea improved   MSSA UTI, MSSA left neck abscess, C-spine osteomyelitis, MSSA bacteremia -Urine culture with MSSA, left neck aspirate with MSSA,  blood cultures 10/9 had shown MSSA -Continue IV Ancef for 6 weeks.  TEE showed no vegetation -ID recommended continuing cefazolin until 11/18, reconsult before the discharge date to assess if patient needs continued oral antibiotics  Diabetes mellitus, brittle, uncontrolled IDDM -Hemoglobin A1c in 02/2019 was 17.7, will repeat -Continue to log sliding scale insulin, meal coverage, Lantus 6 units twice daily  Essential hypertension Continue amlodipine  History of depression Currently stable, no SI, continue Lexapro  History of smoking Continue nicotine patch, counseled on smoking cessation  Homeless -Case management assisting  Protein calorie malnutrition, BMI 15.57 -Registered dietitian consulted  Code Status: DNR DVT Prophylaxis: SCDs Family Communication: Discussed all imaging results, lab results, explained to the patient    Disposition Plan: Remains inpatient till completes IV antibiotics for 6 weeks due to history of IV drug abuse  Time Spent in minutes 35 minutes  Procedures:  Left neck aspiration  Consultants:   ID  Antimicrobials:   Anti-infectives (From admission, onward)   Start     Dose/Rate Route Frequency Ordered Stop   05/17/19 1400  ceFAZolin (ANCEF) IVPB 2g/100 mL premix     2 g 200 mL/hr over 30 Minutes Intravenous Every 8 hours 05/17/19 1353  05/16/19 0200  vancomycin (VANCOCIN) 500 mg in sodium chloride 0.9 % 100 mL IVPB  Status:  Discontinued     500 mg 100 mL/hr over 60 Minutes Intravenous Every 12 hours 05/15/19 1348 05/17/19 1337   05/15/19 1300  vancomycin (VANCOCIN) IVPB 1000 mg/200 mL premix     1,000 mg 200 mL/hr over 60 Minutes Intravenous  Once 05/15/19 1250 05/15/19 2003   05/14/19 2200  cefTRIAXone (ROCEPHIN) 1 g in sodium chloride 0.9 % 100 mL IVPB  Status:  Discontinued     1 g 200 mL/hr over 30 Minutes Intravenous Every 24 hours 05/13/19 2227 05/15/19 1152   05/13/19 2200  cefTRIAXone (ROCEPHIN) 2 g in sodium chloride 0.9 %  100 mL IVPB     2 g 200 mL/hr over 30 Minutes Intravenous  Once 05/13/19 2150 05/14/19 0014          Medications  Scheduled Meds:  amLODipine  5 mg Oral Daily   atorvastatin  20 mg Oral q1800   escitalopram  5 mg Oral Daily   feeding supplement (GLUCERNA SHAKE)  237 mL Oral BID BM   feeding supplement (PRO-STAT SUGAR FREE 64)  30 mL Oral BID   folic acid  1 mg Oral Daily   influenza vac split quadrivalent PF  0.5 mL Intramuscular Tomorrow-1000   insulin aspart  0-5 Units Subcutaneous QHS   insulin aspart  0-9 Units Subcutaneous TID WC   insulin aspart  4 Units Subcutaneous TID WC   insulin glargine  6 Units Subcutaneous BID   lipase/protease/amylase  12,000 Units Oral TID WC   lisinopril  10 mg Oral Daily   magnesium oxide  400 mg Oral BID   multivitamin with minerals  1 tablet Oral Daily   nicotine  14 mg Transdermal Daily   thiamine  100 mg Oral Daily   Continuous Infusions:  sodium chloride 250 mL (05/28/19 1513)    ceFAZolin (ANCEF) IV 2 g (05/28/19 1512)   PRN Meds:.sodium chloride, acetaminophen **OR** acetaminophen, hydrALAZINE, Muscle Rub, oxyCODONE-acetaminophen      Subjective:   Rodney Barber was seen and examined today.  No complaints per patient, no hypoglycemic episodes pain in the neck controlled. Patient denies dizziness, chest pain, shortness of breath, abdominal pain, N/V/D/C, new weakness, numbess, tingling. No acute events overnight.    Objective:   Vitals:   05/27/19 1333 05/27/19 2117 05/28/19 0439 05/28/19 1425  BP: 128/77 123/80 136/77 128/75  Pulse: 74 73 71 76  Resp: 17 18 18 18   Temp: 98.2 F (36.8 C) 98.7 F (37.1 C) 98.3 F (36.8 C)   TempSrc: Oral Oral Oral   SpO2:  99% 99% 100%  Weight:      Height:        Intake/Output Summary (Last 24 hours) at 05/28/2019 1609 Last data filed at 05/28/2019 0544 Gross per 24 hour  Intake 272.6 ml  Output --  Net 272.6 ml     Wt Readings from Last 3 Encounters:   05/20/19 46.4 kg  02/22/19 49.3 kg  12/04/18 59 kg     Exam  General: Alert and oriented x 3, NAD  Eyes:   HEENT:  Atraumatic, normocephalic,  Cardiovascular: S1 S2 auscultated, no murmurs, RRR  Respiratory: Clear to auscultation bilaterally, no wheezing, rales or rhonchi  Gastrointestinal: Soft, nontender, nondistended, + bowel sounds  Ext: no pedal edema bilaterally  Neuro: AAOx3, Cr N's II- XII. Strength 5/5 upper and lower extremities bilaterally  Musculoskeletal: No digital cyanosis, clubbing  Skin: No rashes  Psych: Normal affect and demeanor, alert and oriented x3    Data Reviewed:  I have personally reviewed following labs and imaging studies  Micro Results No results found for this or any previous visit (from the past 240 hour(s)).  Radiology Reports Ct Soft Tissue Neck W Contrast  Result Date: 05/15/2019 CLINICAL DATA:  Back pain, minor trauma left posterior neck pain and soft tissue swelling, suspect hematoma, muscle strain. EXAM: CT NECK WITH CONTRAST TECHNIQUE: Multidetector CT imaging of the neck was performed using the standard protocol following the bolus administration of intravenous contrast. CONTRAST:  75mL OMNIPAQUE IOHEXOL 300 MG/ML  SOLN COMPARISON:  Noncontrast head CT 02/21/2019 FINDINGS: Pharynx and larynx: There is nonspecific diffuse edema within the neck soft tissues. There is poor dentition with multiple absent and carious teeth, as well as periapical lucency surrounding multiple teeth. No discrete mass is demonstrated within the nasopharynx, oral cavity, oropharynx, hypopharynx or larynx. No significant airway compromise at the imaged levels. Salivary glands: The bilateral parotid and submandibular glands are surrounded by nonspecific edema, but intrinsically unremarkable. Thyroid: Unremarkable Lymph nodes: Nonspecific mildly enlarged right level 2 lymph node measuring 12 mm in short axis (series 8, image 32). No other pathologically enlarged  cervical chain lymph nodes identified. Vascular: The cervical right vertebral artery is at times poorly delineated suggestive of multifocal stenoses. The major vascular structures of the neck appear otherwise patent. Calcified plaque at the carotid bifurcations bilaterally. Limited intracranial: Atherosclerotic calcification of the carotid artery siphons. Otherwise unremarkable. Visualized orbits: No acute abnormality Mastoids and visualized paranasal sinuses: Mild mucosal thickening within the inferior maxillary sinuses bilaterally. There is a nonspecific calcified focus within the inferior left maxillary sinus, unchanged from prior head CT. No significant mastoid effusion at the imaged levels. Skeleton: Moderate/severe cervical spondylosis with multilevel posterior disc osteophytes, uncovertebral and facet hypertrophy. This includes moderate/severe disc height loss at the C5-C6 and C6-C7 levels. There is irregular lucency within the left C3 and C4 articular pillars and extending into the left C4 lamina (series 5, images 38-41) (series 8, image 62). Within the adjacent left paraspinal musculature/soft tissues, along the left posterolateral aspect of the cervical spine, there is a peripherally enhancing multiloculated collection which spans the C1-C4 levels, measures up to 2.9 x 1.9 cm in transaxial dimensions and 3.8 cm in craniocaudal dimension (series 3, image 36) (series 8, image 70). Upper chest: No consolidation within the imaged lung apices. Partially imaged small bilateral pleural effusions (greater on the left). These results will be called to the ordering clinician or representative by the Radiologist Assistant, and communication documented in the PACS or zVision Dashboard. IMPRESSION: 1. 2.9 x 1.9 x 3.8 cm multiloculated peripherally enhancing collection within the left posterior paraspinal musculature/soft tissues spanning the C1-C4 levels. Findings are most consistent with abscess. 2. Irregular lucency  within the left C3-C4 articular pillars and also involving the left C4 lamina. Findings likely reflect septic facet joint/osteomyelitis given the adjacent abscess. 3. Diffuse edema within the neck soft tissues suggestive of anasarca. Superimposed cellulitis may be present on the left. 4. Nonspecific mildly enlarged right level 2 lymph node measuring 13 mm in short axis. 5. The right vertebral artery is intermittently poorly delineated suggestive of multifocal stenoses. 6. Poor dentition with multiple absent and carious teeth, as well as periapical lucency surrounding multiple teeth. 7. Maxillary sinus disease as described. Electronically Signed   By: Jackey Loge   On: 05/15/2019 15:11   Ir US Guide Bx Asp/drain  Result Date: 05/16/2019 INDICATION: 53 year old male with what appears to be an abscess in the deep musculature of the left posterior neck. He presents for ultrasound-guided aspiration for culture. EXAM: Ultrasound-guided aspiration MEDICATIONS: The patient is currently admitted to the hospital and receiving intravenous antibiotics. The antibiotics were administered within an appropriate time frame prior to the initiation of the procedure. ANESTHESIA/SEDATION: Fentanyl 50 mcg IV; Versed 1 mg IV Moderate Sedation Time:  10 minutes The patient was continuously monitored during the procedure by the interventional radiology nurse under my direct supervision. COMPLICATIONS: None immediate. PROCEDURE: Informed written consent was obtained from the patient after a thorough discussion of the procedural risks, benefits and alternatives. All questions were addressed. A timeout was performed prior to the initiation of the procedure. The region of clinical concern was interrogated with ultrasound. A small but complex fluid collection was successfully identified. The overlying skin was sterilely prepped and draped in the standard fashion using chlorhexidine skin prep. Local anesthesia was attained by infiltration  with 1% lidocaine. Under real-time sonographic guidance, an 18 gauge trocar needle was carefully advanced and positioned with the tip in the fluid collection. Aspiration was then performed while agitating the trocar. Approximately 2 mL of thick purulent material was successfully aspirated. There was no immediate complication. The fluid was sent for Gram stain and culture. IMPRESSION: Successful ultrasound-guided aspiration of small fluid collection in the left posterior neck. Electronically Signed   By: Malachy Moan M.D.   On: 05/16/2019 16:53    Lab Data:  CBC: Recent Labs  Lab 05/26/19 0432 05/28/19 0702  WBC 7.5 6.4  NEUTROABS 4.7  --   HGB 8.7* 8.8*  HCT 28.4* 28.3*  MCV 99.0 99.3  PLT 289 274   Basic Metabolic Panel: Recent Labs  Lab 05/26/19 0432 05/28/19 0702  NA 137 136  K 4.3 4.3  CL 105 104  CO2 27 25  GLUCOSE 135* 151*  BUN 21* 21*  CREATININE 0.63 0.60*  CALCIUM 8.5* 8.4*  MG 1.9  --   PHOS 4.5  --    GFR: Estimated Creatinine Clearance: 69.3 mL/min (A) (by C-G formula based on SCr of 0.6 mg/dL (L)). Liver Function Tests: No results for input(s): AST, ALT, ALKPHOS, BILITOT, PROT, ALBUMIN in the last 168 hours. No results for input(s): LIPASE, AMYLASE in the last 168 hours. No results for input(s): AMMONIA in the last 168 hours. Coagulation Profile: No results for input(s): INR, PROTIME in the last 168 hours. Cardiac Enzymes: No results for input(s): CKTOTAL, CKMB, CKMBINDEX, TROPONINI in the last 168 hours. BNP (last 3 results) No results for input(s): PROBNP in the last 8760 hours. HbA1C: No results for input(s): HGBA1C in the last 72 hours. CBG: Recent Labs  Lab 05/27/19 1200 05/27/19 1602 05/27/19 2112 05/28/19 0719 05/28/19 1227  GLUCAP 226* 134* 179* 129* 98   Lipid Profile: No results for input(s): CHOL, HDL, LDLCALC, TRIG, CHOLHDL, LDLDIRECT in the last 72 hours. Thyroid Function Tests: No results for input(s): TSH, T4TOTAL, FREET4,  T3FREE, THYROIDAB in the last 72 hours. Anemia Panel: No results for input(s): VITAMINB12, FOLATE, FERRITIN, TIBC, IRON, RETICCTPCT in the last 72 hours. Urine analysis:    Component Value Date/Time   COLORURINE STRAW (A) 05/13/2019 2028   APPEARANCEUR HAZY (A) 05/13/2019 2028   LABSPEC 1.024 05/13/2019 2028   PHURINE 6.0 05/13/2019 2028   GLUCOSEU >=500 (A) 05/13/2019 2028   HGBUR SMALL (A) 05/13/2019 2028   BILIRUBINUR NEGATIVE 05/13/2019 2028   BILIRUBINUR negative 03/15/2018 1049  BILIRUBINUR negative 12/26/2017 0941   KETONESUR NEGATIVE 05/13/2019 2028   PROTEINUR 30 (A) 05/13/2019 2028   UROBILINOGEN 0.2 03/15/2018 1049   UROBILINOGEN 0.2 12/08/2010 1613   NITRITE POSITIVE (A) 05/13/2019 2028   LEUKOCYTESUR LARGE (A) 05/13/2019 2028     Chas Axel M.D. [facility] 05/28/2019, 4:09 PM  Pager: [phone] Between 7am to 7pm - call Pager - [phone]  After 7pm go to [email or web address] - password [email or web address]  Call night coverage person covering after 7pm

## 2019-05-28 NOTE — Progress Notes (Signed)
Hypoglycemic Event  CBG: 60  Treatment: 4 oz orange juice per hypoglycemia protocol.  Symptoms: asymptomatic per patient  Follow-up CBG: Time: 1720 CBG Result: 70 Patient eating dinner. Mealtime and SSI coverage held at 1700.   Possible Reasons for Event: 4u mealtime coverage for lunch; glucose was 98, ate 100% of lunch.  Comments/MD notified: Dr. Tana Coast via text page.   Lilli Few

## 2019-05-29 LAB — BASIC METABOLIC PANEL
Anion gap: 6 (ref 5–15)
BUN: 22 mg/dL — ABNORMAL HIGH (ref 6–20)
CO2: 25 mmol/L (ref 22–32)
Calcium: 8.4 mg/dL — ABNORMAL LOW (ref 8.9–10.3)
Chloride: 106 mmol/L (ref 98–111)
Creatinine, Ser: 0.71 mg/dL (ref 0.61–1.24)
GFR calc Af Amer: >60 mL/min (ref >60)
GFR calc non Af Amer: >60 mL/min (ref >60)
Glucose, Bld: 120 mg/dL — ABNORMAL HIGH (ref 70–99)
Potassium: 4.2 mmol/L (ref 3.5–5.1)
Sodium: 137 mmol/L (ref 135–145)

## 2019-05-29 LAB — CBC
HCT: 29.3 % — ABNORMAL LOW (ref 39.0–52.0)
Hemoglobin: 8.9 g/dL — ABNORMAL LOW (ref 13.0–17.0)
MCH: 30.6 pg (ref 26.0–34.0)
MCHC: 30.4 g/dL (ref 30.0–36.0)
MCV: 100.7 fL — ABNORMAL HIGH (ref 80.0–100.0)
Platelets: 261 10*3/uL (ref 150–400)
RBC: 2.91 MIL/uL — ABNORMAL LOW (ref 4.22–5.81)
RDW: 13.5 % (ref 11.5–15.5)
WBC: 6.8 10*3/uL (ref 4.0–10.5)
nRBC: 0 % (ref 0.0–0.2)

## 2019-05-29 LAB — GLUCOSE, CAPILLARY
Glucose-Capillary: 125 mg/dL — ABNORMAL HIGH (ref 70–99)
Glucose-Capillary: 193 mg/dL — ABNORMAL HIGH (ref 70–99)
Glucose-Capillary: 274 mg/dL — ABNORMAL HIGH (ref 70–99)
Glucose-Capillary: 214 mg/dL — ABNORMAL HIGH (ref 70–99)

## 2019-05-29 LAB — HEMOGLOBIN A1C
Hgb A1c MFr Bld: 14.3 % — ABNORMAL HIGH (ref 4.8–5.6)
Mean Plasma Glucose: 363.71 mg/dL

## 2019-05-29 NOTE — Progress Notes (Signed)
[facility]                                                                              Patient Demographics  Rodney Barber, is a 53 y.o. male, DOB - 1965-09-05, ZOX:[id number]  Admit date - 05/13/2019   Admitting Physician [name], MD  Outpatient Primary MD for the patient is [name], NP  Outpatient specialists:   LOS - 15  days   Medical records reviewed and are as summarized below:    Chief Complaint  Patient presents with   Hyperglycemia   Weakness       Brief summary   53 year old male with history of insulin-dependent diabetes, hypertension, chronic pancreatitis, smoker, recent prolonged admission in the hospital complicated with initial suicidal ideation later cleared, deconditioning, now homeless presents to the emergency room with extreme weakness and leg cramps, EMS found with blood sugars of 500.  In the emergency room blood pressures elevated.  Potassium less than 2.  EKG with U waves.  Patient admitted to the hospital with hyperglycemia and severe hypokalemia as well UTI like presentation. Patient stated that he was out of insulin for more than a week.  Patient had diarrhea secondary to taking lactulose at home for high ammonia.  After admission, his urine culture grew MSSA, was on Rocephin, he had left-sided neck abscess, that also grew MSSA.  Blood cultures negative, however it was drawn after being on Rocephin. Remains in the hospital to finish antibiotics, until 06/25/2019 due to history of IV drug abuse, unsafe to discharge with PICC line    Assessment & Plan    Electrolyte abnormalities including profound hypokalemia, hypophosphatemia -At the time of admission potassium less than 2.0, phosphorus less than 1.0 -Aggressively depressed, rectal lites now normal   MSSA UTI, MSSA left neck abscess, C-spine osteomyelitis, MSSA bacteremia -Urine culture with MSSA, left neck aspirate with MSSA, blood cultures 10/9 had  shown MSSA -Continue IV Ancef for 6 weeks.  TEE showed no vegetation -ID recommended continuing cefazolin until 11/18, reconsult before the discharge date to assess if patient needs continued oral antibiotics  Diabetes mellitus, brittle, uncontrolled IDDM with hypoglycemia -Hemoglobin A1c in 02/2019 was 17.7, repeat hemoglobin A1c 14.3 -Discontinued meal coverage due to hypoglycemia yesterday, continue sliding scale insulin, Lantus  Essential hypertension BP stable, continue amlodipine  History of depression Currently stable, no SI, continue Lexapro  History of smoking Continue nicotine patch, counseled on smoking cessation  Homeless -Case management assisting  Protein calorie malnutrition, BMI 15.57 -Registered dietitian consulted  Code Status: DNR DVT Prophylaxis: SCDs Family Communication: Discussed all imaging results, lab results, explained to the patient    Disposition Plan: Remains inpatient till completes IV antibiotics for 6 weeks due to history of IV drug abuse  Time Spent in minutes 35 minutes  Procedures:  Left neck aspiration  Consultants:   ID  Antimicrobials:   Anti-infectives (From admission, onward)   Start     Dose/Rate Route Frequency Ordered Stop   05/17/19 1400  ceFAZolin (ANCEF) IVPB 2g/100 mL premix     2 g 200 mL/hr over 30 Minutes Intravenous Every 8 hours 05/17/19 1353  05/16/19 0200  vancomycin (VANCOCIN) 500 mg in sodium chloride 0.9 % 100 mL IVPB  Status:  Discontinued     500 mg 100 mL/hr over 60 Minutes Intravenous Every 12 hours 05/15/19 1348 05/17/19 1337   05/15/19 1300  vancomycin (VANCOCIN) IVPB 1000 mg/200 mL premix     1,000 mg 200 mL/hr over 60 Minutes Intravenous  Once 05/15/19 1250 05/15/19 2003   05/14/19 2200  cefTRIAXone (ROCEPHIN) 1 g in sodium chloride 0.9 % 100 mL IVPB  Status:  Discontinued     1 g 200 mL/hr over 30 Minutes Intravenous Every 24 hours 05/13/19 2227 05/15/19 1152   05/13/19 2200  cefTRIAXone  (ROCEPHIN) 2 g in sodium chloride 0.9 % 100 mL IVPB     2 g 200 mL/hr over 30 Minutes Intravenous  Once 05/13/19 2150 05/14/19 0014         Medications  Scheduled Meds:  amLODipine  5 mg Oral Daily   atorvastatin  20 mg Oral q1800   escitalopram  5 mg Oral Daily   feeding supplement (GLUCERNA SHAKE)  237 mL Oral BID BM   feeding supplement (PRO-STAT SUGAR FREE 64)  30 mL Oral BID   folic acid  1 mg Oral Daily   influenza vac split quadrivalent PF  0.5 mL Intramuscular Tomorrow-1000   insulin aspart  0-5 Units Subcutaneous QHS   insulin aspart  0-9 Units Subcutaneous TID WC   insulin glargine  6 Units Subcutaneous BID   lipase/protease/amylase  12,000 Units Oral TID WC   lisinopril  10 mg Oral Daily   magnesium oxide  400 mg Oral BID   multivitamin with minerals  1 tablet Oral Daily   nicotine  14 mg Transdermal Daily   thiamine  100 mg Oral Daily   Continuous Infusions:  sodium chloride 250 mL (05/28/19 1513)    ceFAZolin (ANCEF) IV 2 g (05/29/19 1340)   PRN Meds:.sodium chloride, acetaminophen **OR** acetaminophen, hydrALAZINE, Muscle Rub, oxyCODONE-acetaminophen      Subjective:   Rodney Barber was seen and examined today.  No complaints today.  No further hypoglycemia episodes overnight.  Neck pain controlled.  No focal neurological deficits. Patient denies dizziness, chest pain, shortness of breath, abdominal pain, N/V/D/C, new weakness, numbess, tingling.   Objective:   Vitals:   05/28/19 2008 05/29/19 0424 05/29/19 0918 05/29/19 1500  BP: 134/83 140/77 115/70 118/61  Pulse: 71 72 72 77  Resp: 18 18 18 16   Temp: 98.3 F (36.8 C) 98.2 F (36.8 C) 97.9 F (36.6 C) 98 F (36.7 C)  TempSrc: Oral Oral Oral Oral  SpO2: 97% 100% 99% 100%  Weight:      Height:        Intake/Output Summary (Last 24 hours) at 05/29/2019 1515 Last data filed at 05/28/2019 2300 Gross per 24 hour  Intake 850 ml  Output --  Net 850 ml     Wt Readings  from Last 3 Encounters:  05/20/19 46.4 kg  02/22/19 49.3 kg  12/04/18 59 kg   Physical Exam  General: Alert and oriented x 3, NAD  Eyes:   HEENT:  Atraumatic, normocephalic  Cardiovascular: S1 S2 clear, no murmurs, RRR. No pedal edema b/l  Respiratory: CTAB, no wheezing, rales or rhonchi  Gastrointestinal: Soft, nontender, nondistended, NBS  Ext: no pedal edema bilaterally  Neuro: no new deficits  Musculoskeletal: No cyanosis, clubbing  Skin: No rashes  Psych: Normal affect and demeanor, alert and oriented x3  Data Reviewed:  I have personally reviewed following labs and imaging studies  Micro Results Recent Results (from the past 240 hour(s))  Culture, blood (Routine X 2) w Reflex to ID Panel     Status: None (Preliminary result)   Collection Time: 05/28/19  4:28 PM   Specimen: BLOOD  Result Value Ref Range Status   Specimen Description   Final    BLOOD LEFT ANTECUBITAL Performed at [facility], [address] [place], [place] [address]    Special Requests   Final    BOTTLES DRAWN AEROBIC AND ANAEROBIC Blood Culture adequate volume Performed at [facility], [address], [place], [place] [address]    Culture   Final    NO GROWTH < 12 HOURS Performed at [facility], [address], [place], [place] [address]    Report Status PENDING  Incomplete  Culture, blood (Routine X 2) w Reflex to ID Panel     Status: None (Preliminary result)   Collection Time: 05/28/19  4:34 PM   Specimen: BLOOD LEFT FOREARM  Result Value Ref Range Status   Specimen Description   Final    BLOOD LEFT FOREARM Performed at [facility], [address], [place], [place] [address]    Special Requests   Final    BOTTLES DRAWN AEROBIC AND ANAEROBIC Blood Culture adequate volume Performed at [facility], [address] [place], [place] [address]    Culture   Final    NO GROWTH < 12  HOURS Performed at [facility], [address], [place], [place] [address]    Report Status PENDING  Incomplete    Radiology Reports Ct Soft Tissue Neck W Contrast  Result Date: 05/15/2019 CLINICAL DATA:  Back pain, minor trauma left posterior neck pain and soft tissue swelling, suspect hematoma, muscle strain. EXAM: CT NECK WITH CONTRAST TECHNIQUE: Multidetector CT imaging of the neck was performed using the standard protocol following the bolus administration of intravenous contrast. CONTRAST:  75mL OMNIPAQUE IOHEXOL 300 MG/ML  SOLN COMPARISON:  Noncontrast head CT 02/21/2019 FINDINGS: Pharynx and larynx: There is nonspecific diffuse edema within the neck soft tissues. There is poor dentition with multiple absent and carious teeth, as well as periapical lucency surrounding multiple teeth. No discrete mass is demonstrated within the nasopharynx, oral cavity, oropharynx, hypopharynx or larynx. No significant airway compromise at the imaged levels. Salivary glands: The bilateral parotid and submandibular glands are surrounded by nonspecific edema, but intrinsically unremarkable. Thyroid: Unremarkable Lymph nodes: Nonspecific mildly enlarged right level 2 lymph node measuring 12 mm in short axis (series 8, image 32). No other pathologically enlarged cervical chain lymph nodes identified. Vascular: The cervical right vertebral artery is at times poorly delineated suggestive of multifocal stenoses. The major vascular structures of the neck appear otherwise patent. Calcified plaque at the carotid bifurcations bilaterally. Limited intracranial: Atherosclerotic calcification of the carotid artery siphons. Otherwise unremarkable. Visualized orbits: No acute abnormality Mastoids and visualized paranasal sinuses: Mild mucosal thickening within the inferior maxillary sinuses bilaterally. There is a nonspecific calcified focus within the inferior left maxillary sinus, unchanged from prior head CT. No significant  mastoid effusion at the imaged levels. Skeleton: Moderate/severe cervical spondylosis with multilevel posterior disc osteophytes, uncovertebral and facet hypertrophy. This includes moderate/severe disc height loss at the C5-C6 and C6-C7 levels. There is irregular lucency within the left C3 and C4 articular pillars and extending into the left C4 lamina (series 5, images 38-41) (  series 8, image 62). Within the adjacent left paraspinal musculature/soft tissues, along the left posterolateral aspect of the cervical spine, there is a peripherally enhancing multiloculated collection which spans the C1-C4 levels, measures up to 2.9 x 1.9 cm in transaxial dimensions and 3.8 cm in craniocaudal dimension (series 3, image 36) (series 8, image 70). Upper chest: No consolidation within the imaged lung apices. Partially imaged small bilateral pleural effusions (greater on the left). These results will be called to the ordering clinician or representative by the Radiologist Assistant, and communication documented in the PACS or zVision Dashboard. IMPRESSION: 1. 2.9 x 1.9 x 3.8 cm multiloculated peripherally enhancing collection within the left posterior paraspinal musculature/soft tissues spanning the C1-C4 levels. Findings are most consistent with abscess. 2. Irregular lucency within the left C3-C4 articular pillars and also involving the left C4 lamina. Findings likely reflect septic facet joint/osteomyelitis given the adjacent abscess. 3. Diffuse edema within the neck soft tissues suggestive of anasarca. Superimposed cellulitis may be present on the left. 4. Nonspecific mildly enlarged right level 2 lymph node measuring 13 mm in short axis. 5. The right vertebral artery is intermittently poorly delineated suggestive of multifocal stenoses. 6. Poor dentition with multiple absent and carious teeth, as well as periapical lucency surrounding multiple teeth. 7. Maxillary sinus disease as described. Electronically Signed   By: Jackey Loge   On: 05/15/2019 15:11   Ir US Guide Bx Asp/drain  Result Date: 05/16/2019 INDICATION: 53 year old male with what appears to be an abscess in the deep musculature of the left posterior neck. He presents for ultrasound-guided aspiration for culture. EXAM: Ultrasound-guided aspiration MEDICATIONS: The patient is currently admitted to the hospital and receiving intravenous antibiotics. The antibiotics were administered within an appropriate time frame prior to the initiation of the procedure. ANESTHESIA/SEDATION: Fentanyl 50 mcg IV; Versed 1 mg IV Moderate Sedation Time:  10 minutes The patient was continuously monitored during the procedure by the interventional radiology nurse under my direct supervision. COMPLICATIONS: None immediate. PROCEDURE: Informed written consent was obtained from the patient after a thorough discussion of the procedural risks, benefits and alternatives. All questions were addressed. A timeout was performed prior to the initiation of the procedure. The region of clinical concern was interrogated with ultrasound. A small but complex fluid collection was successfully identified. The overlying skin was sterilely prepped and draped in the standard fashion using chlorhexidine skin prep. Local anesthesia was attained by infiltration with 1% lidocaine. Under real-time sonographic guidance, an 18 gauge trocar needle was carefully advanced and positioned with the tip in the fluid collection. Aspiration was then performed while agitating the trocar. Approximately 2 mL of thick purulent material was successfully aspirated. There was no immediate complication. The fluid was sent for Gram stain and culture. IMPRESSION: Successful ultrasound-guided aspiration of small fluid collection in the left posterior neck. Electronically Signed   By: Malachy Moan M.D.   On: 05/16/2019 16:53    Lab Data:  CBC: Recent Labs  Lab 05/26/19 0432 05/28/19 0702 05/29/19 0513  WBC 7.5 6.4 6.8    NEUTROABS 4.7  --   --   HGB 8.7* 8.8* 8.9*  HCT 28.4* 28.3* 29.3*  MCV 99.0 99.3 100.7*  PLT 289 274 261   Basic Metabolic Panel: Recent Labs  Lab 05/26/19 0432 05/28/19 0702 05/29/19 0513  NA 137 136 137  K 4.3 4.3 4.2  CL 105 104 106  CO2 27 25 25   GLUCOSE 135* 151* 120*  BUN 21* 21* 22*  CREATININE  0.63 0.60* 0.71  CALCIUM 8.5* 8.4* 8.4*  MG 1.9  --   --   PHOS 4.5  --   --    GFR: Estimated Creatinine Clearance: 69.3 mL/min (by C-G formula based on SCr of 0.71 mg/dL). Liver Function Tests: No results for input(s): AST, ALT, ALKPHOS, BILITOT, PROT, ALBUMIN in the last 168 hours. No results for input(s): LIPASE, AMYLASE in the last 168 hours. No results for input(s): AMMONIA in the last 168 hours. Coagulation Profile: No results for input(s): INR, PROTIME in the last 168 hours. Cardiac Enzymes: No results for input(s): CKTOTAL, CKMB, CKMBINDEX, TROPONINI in the last 168 hours. BNP (last 3 results) No results for input(s): PROBNP in the last 8760 hours. HbA1C: Recent Labs    05/29/19 0513  HGBA1C 14.3*   CBG: Recent Labs  Lab 05/28/19 1702 05/28/19 1720 05/28/19 2005 05/29/19 0723 05/29/19 1156  GLUCAP 61* 70 231* 125* 193*   Lipid Profile: No results for input(s): CHOL, HDL, LDLCALC, TRIG, CHOLHDL, LDLDIRECT in the last 72 hours. Thyroid Function Tests: No results for input(s): TSH, T4TOTAL, FREET4, T3FREE, THYROIDAB in the last 72 hours. Anemia Panel: No results for input(s): VITAMINB12, FOLATE, FERRITIN, TIBC, IRON, RETICCTPCT in the last 72 hours. Urine analysis:    Component Value Date/Time   COLORURINE STRAW (A) 05/13/2019 2028   APPEARANCEUR HAZY (A) 05/13/2019 2028   LABSPEC 1.024 05/13/2019 2028   PHURINE 6.0 05/13/2019 2028   GLUCOSEU >=500 (A) 05/13/2019 2028   HGBUR SMALL (A) 05/13/2019 2028   BILIRUBINUR NEGATIVE 05/13/2019 2028   BILIRUBINUR negative 03/15/2018 1049   BILIRUBINUR negative 12/26/2017 0941   KETONESUR NEGATIVE  05/13/2019 2028   PROTEINUR 30 (A) 05/13/2019 2028   UROBILINOGEN 0.2 03/15/2018 1049   UROBILINOGEN 0.2 12/08/2010 1613   NITRITE POSITIVE (A) 05/13/2019 2028   LEUKOCYTESUR LARGE (A) 05/13/2019 2028     Crosby Bevan M.D. [facility] 05/29/2019, 3:15 PM  Pager: [phone] Between 7am to 7pm - call Pager - [phone]  After 7pm go to [email or web address] - password [email or web address]  Call night coverage person covering after 7pm

## 2019-05-30 LAB — GLUCOSE, CAPILLARY
Glucose-Capillary: 169 mg/dL — ABNORMAL HIGH (ref 70–99)
Glucose-Capillary: 126 mg/dL — ABNORMAL HIGH (ref 70–99)
Glucose-Capillary: 296 mg/dL — ABNORMAL HIGH (ref 70–99)
Glucose-Capillary: 295 mg/dL — ABNORMAL HIGH (ref 70–99)

## 2019-05-30 LAB — BASIC METABOLIC PANEL
Anion gap: 6 (ref 5–15)
BUN: 23 mg/dL — ABNORMAL HIGH (ref 6–20)
CO2: 24 mmol/L (ref 22–32)
Calcium: 8.5 mg/dL — ABNORMAL LOW (ref 8.9–10.3)
Chloride: 108 mmol/L (ref 98–111)
Creatinine, Ser: 0.68 mg/dL (ref 0.61–1.24)
GFR calc Af Amer: >60 mL/min (ref >60)
GFR calc non Af Amer: >60 mL/min (ref >60)
Glucose, Bld: 246 mg/dL — ABNORMAL HIGH (ref 70–99)
Potassium: 4.3 mmol/L (ref 3.5–5.1)
Sodium: 138 mmol/L (ref 135–145)

## 2019-05-30 LAB — CBC
HCT: 28.5 % — ABNORMAL LOW (ref 39.0–52.0)
Hemoglobin: 8.7 g/dL — ABNORMAL LOW (ref 13.0–17.0)
MCH: 30.6 pg (ref 26.0–34.0)
MCHC: 30.5 g/dL (ref 30.0–36.0)
MCV: 100.4 fL — ABNORMAL HIGH (ref 80.0–100.0)
Platelets: 256 10*3/uL (ref 150–400)
RBC: 2.84 MIL/uL — ABNORMAL LOW (ref 4.22–5.81)
RDW: 13.4 % (ref 11.5–15.5)
WBC: 7 10*3/uL (ref 4.0–10.5)
nRBC: 0 % (ref 0.0–0.2)

## 2019-05-30 MED ORDER — INSULIN ASPART 100 UNIT/ML ~~LOC~~ SOLN
3.0000 [IU] | Freq: Three times a day (TID) | SUBCUTANEOUS | Status: DC
  Administered 2019-05-30 – 2019-06-05 (×18): 3 [IU] via SUBCUTANEOUS

## 2019-05-30 NOTE — Progress Notes (Signed)
Occupational Therapy Treatment Patient Details Name: Rodney Barber MRN: [id number] DOB: 09/29/1965 Today's Date: 05/30/2019    History of present illness 53 year old male with history of insulin-dependent diabetes, hypertension, chronic pancreatitis, smoker, recent prolonged admission in the hospital complicated with initial suicidal ideation later cleared, deconditioning, now homeless presents to the emergency room with extreme weakness and leg cramps, EMS found with blood sugars of 500,severe hypokalemia, hypophosphatemia, and MSSA UTI , MSSA left neck abscess and C-spine osteomyelitis   OT comments  Pt seen by OT this date to address safety with ADL mobility and standing ADLs. Pt demos G static standing balance, but demos very slight LOB x2 occasions on pivoting d/t close proximity of feet requiring verbal cues and CGA for safety. Pt completes standing self care with Supv and setup. Pt progressing towards goals, d/c rec remains appropriate.    Follow Up Recommendations  No OT follow up    Equipment Recommendations  None recommended by OT    Recommendations for Other Services      Precautions / Restrictions Precautions Precautions: Fall Restrictions Weight Bearing Restrictions: No       Mobility Bed Mobility Overal bed mobility: Independent                Transfers Overall transfer level: Needs assistance Equipment used: Straight cane Transfers: Sit to/from Stand Sit to Stand: Min guard         General transfer comment: for safety    Balance Overall balance assessment: Mild deficits observed, not formally tested             Standing balance comment: minimal LOB x2 occasions (on pivot) during fxl mobility with OT providing very minimal assistance to correct. Requires MIN verbal cues to adequately space feet for safety.                           ADL either performed or assessed with clinical judgement   ADL Overall ADL's : Needs  assistance/impaired     Grooming: Wash/dry hands;Standing;Supervision/safety                               Functional mobility during ADLs: Supervision/safety;Min guard;Cane       Vision Patient Visual Report: No change from baseline     Perception     Praxis      Cognition Arousal/Alertness: Awake/alert Behavior During Therapy: WFL for tasks assessed/performed Overall Cognitive Status: Within Functional Limits for tasks assessed                                          Exercises Other Exercises Other Exercises: OT facilitates education re: safety with fxl mobility including avoiding fall hazards and fall prevention with pivoting.   Shoulder Instructions       General Comments      Pertinent Vitals/ Pain       Pain Assessment: No/denies pain  Home Living                                          Prior Functioning/Environment              Frequency  Min 2X/week  Progress Toward Goals  OT Goals(current goals can now be found in the care plan section)  Progress towards OT goals: Progressing toward goals  Acute Rehab OT Goals Patient Stated Goal: move better OT Goal Formulation: With patient Time For Goal Achievement: 05/28/19 Potential to Achieve Goals: Good  Plan Discharge plan remains appropriate    Co-evaluation                 AM-PAC OT "6 Clicks" Daily Activity     Outcome Measure   Help from another person eating meals?: None Help from another person taking care of personal grooming?: None Help from another person toileting, which includes using toliet, bedpan, or urinal?: A Little Help from another person bathing (including washing, rinsing, drying)?: A Little Help from another person to put on and taking off regular upper body clothing?: None Help from another person to put on and taking off regular lower body clothing?: A Little 6 Click Score: 21    End of Session  Equipment Utilized During Treatment: Gait belt(cane)  OT Visit Diagnosis: Unsteadiness on feet (R26.81)   Activity Tolerance Patient tolerated treatment well   Patient Left with call bell/phone within reach;in chair;with chair alarm set   Nurse Communication          Time: [phone] OT Time Calculation (min): 26 min  Charges: OT General Charges $OT Visit: 1 Visit OT Treatments $Self Care/Home Management : 8-22 mins $Therapeutic Activity: 8-22 mins    Kerry Fort 05/30/2019, 6:13 PM

## 2019-05-30 NOTE — Progress Notes (Signed)
Assumed care of pt from off going RN. Condition table. No changes in initial am assessment. Cont with plan of care.  

## 2019-05-30 NOTE — Progress Notes (Signed)
[facility]                                                                              Patient Demographics  Rodney Barber, is a 53 y.o. male, DOB - 1966/01/13, VHQ:[id number]  Admit date - 05/13/2019   Admitting Physician [name], MD  Outpatient Primary MD for the patient is [name], NP  Outpatient specialists:   LOS - 16  days   Medical records reviewed and are as summarized below:    Chief Complaint  Patient presents with   Hyperglycemia   Weakness       Brief summary   53 year old male with history of insulin-dependent diabetes, hypertension, chronic pancreatitis, smoker, recent prolonged admission in the hospital complicated with initial suicidal ideation later cleared, deconditioning, now homeless presents to the emergency room with extreme weakness and leg cramps, EMS found with blood sugars of 500.  In the emergency room blood pressures elevated.  Potassium less than 2.  EKG with U waves.  Patient admitted to the hospital with hyperglycemia and severe hypokalemia as well UTI like presentation. Patient stated that he was out of insulin for more than a week.  Patient had diarrhea secondary to taking lactulose at home for high ammonia.  After admission, his urine culture grew MSSA, was on Rocephin, he had left-sided neck abscess, that also grew MSSA.  Blood cultures negative, however it was drawn after being on Rocephin. Remains in the hospital to finish antibiotics, until 06/25/2019 due to history of IV drug abuse, unsafe to discharge with PICC line    Assessment & Plan    Electrolyte abnormalities including profound hypokalemia, hypophosphatemia -At the time of admission potassium less than 2.0, phosphorus less than 1.0 -Electrolytes within normal limits   MSSA UTI, MSSA left neck abscess, C-spine osteomyelitis, MSSA bacteremia -Urine culture with MSSA, left neck aspirate with MSSA, blood cultures 10/9 had shown  MSSA -Continue IV Ancef for 6 weeks.  TEE showed no vegetation -ID recommended continuing cefazolin until 11/18, reconsult before the discharge date to assess if patient needs continued oral antibiotics  Diabetes mellitus, brittle, uncontrolled IDDM with hypoglycemia -Hemoglobin A1c in 02/2019 was 17.7, repeat hemoglobin A1c 14.3 -Brittle diabetes with hypo and hyperglycemia episodes, continue NovoLog 3 units 3 times daily AC with sliding scale insulin, Lantus 6 units twice daily  Essential hypertension BP stable, continue amlodipine  History of depression Currently stable, no SI, continue Lexapro  History of smoking Continue nicotine patch, counseled on smoking cessation  Homeless -Case management assisting  Protein calorie malnutrition, BMI 15.57 -Registered dietitian consulted  Code Status: DNR DVT Prophylaxis: SCDs Family Communication: Discussed all imaging results, lab results, explained to the patient    Disposition Plan: Remains inpatient till completes IV antibiotics for 6 weeks due to history of IV drug abuse.  No acute issues  Time Spent in minutes 35 minutes  Procedures:  Left neck aspiration  Consultants:   ID  Antimicrobials:   Anti-infectives (From admission, onward)   Start     Dose/Rate Route Frequency Ordered Stop   05/17/19 1400  ceFAZolin (ANCEF) IVPB 2g/100 mL premix  2 g 200 mL/hr over 30 Minutes Intravenous Every 8 hours 05/17/19 1353     05/16/19 0200  vancomycin (VANCOCIN) 500 mg in sodium chloride 0.9 % 100 mL IVPB  Status:  Discontinued     500 mg 100 mL/hr over 60 Minutes Intravenous Every 12 hours 05/15/19 1348 05/17/19 1337   05/15/19 1300  vancomycin (VANCOCIN) IVPB 1000 mg/200 mL premix     1,000 mg 200 mL/hr over 60 Minutes Intravenous  Once 05/15/19 1250 05/15/19 2003   05/14/19 2200  cefTRIAXone (ROCEPHIN) 1 g in sodium chloride 0.9 % 100 mL IVPB  Status:  Discontinued     1 g 200 mL/hr over 30 Minutes Intravenous Every 24  hours 05/13/19 2227 05/15/19 1152   05/13/19 2200  cefTRIAXone (ROCEPHIN) 2 g in sodium chloride 0.9 % 100 mL IVPB     2 g 200 mL/hr over 30 Minutes Intravenous  Once 05/13/19 2150 05/14/19 0014         Medications  Scheduled Meds:  amLODipine  5 mg Oral Daily   atorvastatin  20 mg Oral q1800   escitalopram  5 mg Oral Daily   feeding supplement (GLUCERNA SHAKE)  237 mL Oral BID BM   feeding supplement (PRO-STAT SUGAR FREE 64)  30 mL Oral BID   folic acid  1 mg Oral Daily   influenza vac split quadrivalent PF  0.5 mL Intramuscular Tomorrow-1000   insulin aspart  0-5 Units Subcutaneous QHS   insulin aspart  0-9 Units Subcutaneous TID WC   insulin aspart  3 Units Subcutaneous TID WC   insulin glargine  6 Units Subcutaneous BID   lipase/protease/amylase  12,000 Units Oral TID WC   lisinopril  10 mg Oral Daily   magnesium oxide  400 mg Oral BID   multivitamin with minerals  1 tablet Oral Daily   nicotine  14 mg Transdermal Daily   thiamine  100 mg Oral Daily   Continuous Infusions:  sodium chloride 250 mL (05/28/19 1513)    ceFAZolin (ANCEF) IV 2 g (05/30/19 1353)   PRN Meds:.sodium chloride, acetaminophen **OR** acetaminophen, hydrALAZINE, Muscle Rub, oxyCODONE-acetaminophen      Subjective:   [name] was seen and examined today.  No acute complaints this morning.  No focal neurological deficits.  Patient denies dizziness, chest pain, shortness of breath, abdominal pain, N/V/D/C, new weakness, numbess, tingling.   Objective:   Vitals:   05/30/19 0503 05/30/19 1204 05/30/19 1309 05/30/19 1310  BP: 127/78 126/78  127/74  Pulse: 73 73  76  Resp: 16   18  Temp: 98.8 F (37.1 C)  98.2 F (36.8 C)   TempSrc: Oral  Oral   SpO2: 97% 100%  100%  Weight:      Height:        Intake/Output Summary (Last 24 hours) at 05/30/2019 1504 Last data filed at 05/30/2019 0503 Gross per 24 hour  Intake 2098.89 ml  Output --  Net 2098.89 ml     Wt  Readings from Last 3 Encounters:  05/20/19 46.4 kg  02/22/19 49.3 kg  12/04/18 59 kg   Physical Exam  General: Alert and oriented x 3, NAD  Eyes:   HEENT:  Atraumatic, normocephalic  Cardiovascular: S1 S2 clear, RRR. No pedal edema b/l  Respiratory: CTAB, no wheezing, rales or rhonchi  Gastrointestinal: Soft, nontender, nondistended, NBS  Ext: no pedal edema bilaterally  Neuro: no new deficits  Musculoskeletal: No cyanosis, clubbing  Skin: No rashes  Psych: Normal affect  and demeanor, alert and oriented x3       Data Reviewed:  I have personally reviewed following labs and imaging studies  Micro Results Recent Results (from the past 240 hour(s))  Culture, blood (Routine X 2) w Reflex to ID Panel     Status: None (Preliminary result)   Collection Time: 05/28/19  4:28 PM   Specimen: BLOOD  Result Value Ref Range Status   Specimen Description   Final    BLOOD LEFT ANTECUBITAL Performed at [facility], [address] [place], [place] [address]    Special Requests   Final    BOTTLES DRAWN AEROBIC AND ANAEROBIC Blood Culture adequate volume Performed at [facility], [address], [place], [place] [address]    Culture   Final    NO GROWTH 2 DAYS Performed at [facility], [address], [place], [place] [address]    Report Status PENDING  Incomplete  Culture, blood (Routine X 2) w Reflex to ID Panel     Status: None (Preliminary result)   Collection Time: 05/28/19  4:34 PM   Specimen: BLOOD LEFT FOREARM  Result Value Ref Range Status   Specimen Description   Final    BLOOD LEFT FOREARM Performed at [facility], [address] [place], [place] [address]    Special Requests   Final    BOTTLES DRAWN AEROBIC AND ANAEROBIC Blood Culture adequate volume Performed at [facility], [address] [place], [place] [address]    Culture   Final    NO GROWTH 2 DAYS Performed at  [facility], [address], [place], [place] [address]    Report Status PENDING  Incomplete    Radiology Reports Ct Soft Tissue Neck W Contrast  Result Date: 05/15/2019 CLINICAL DATA:  Back pain, minor trauma left posterior neck pain and soft tissue swelling, suspect hematoma, muscle strain. EXAM: CT NECK WITH CONTRAST TECHNIQUE: Multidetector CT imaging of the neck was performed using the standard protocol following the bolus administration of intravenous contrast. CONTRAST:  75mL OMNIPAQUE IOHEXOL 300 MG/ML  SOLN COMPARISON:  Noncontrast head CT 02/21/2019 FINDINGS: Pharynx and larynx: There is nonspecific diffuse edema within the neck soft tissues. There is poor dentition with multiple absent and carious teeth, as well as periapical lucency surrounding multiple teeth. No discrete mass is demonstrated within the nasopharynx, oral cavity, oropharynx, hypopharynx or larynx. No significant airway compromise at the imaged levels. Salivary glands: The bilateral parotid and submandibular glands are surrounded by nonspecific edema, but intrinsically unremarkable. Thyroid: Unremarkable Lymph nodes: Nonspecific mildly enlarged right level 2 lymph node measuring 12 mm in short axis (series 8, image 32). No other pathologically enlarged cervical chain lymph nodes identified. Vascular: The cervical right vertebral artery is at times poorly delineated suggestive of multifocal stenoses. The major vascular structures of the neck appear otherwise patent. Calcified plaque at the carotid bifurcations bilaterally. Limited intracranial: Atherosclerotic calcification of the carotid artery siphons. Otherwise unremarkable. Visualized orbits: No acute abnormality Mastoids and visualized paranasal sinuses: Mild mucosal thickening within the inferior maxillary sinuses bilaterally. There is a nonspecific calcified focus within the inferior left maxillary sinus, unchanged from prior head CT. No significant mastoid effusion at  the imaged levels. Skeleton: Moderate/severe cervical spondylosis with multilevel posterior disc osteophytes, uncovertebral and facet hypertrophy. This includes moderate/severe disc height loss at the C5-C6 and C6-C7 levels. There is irregular lucency within the left C3 and C4 articular pillars and  extending into the left C4 lamina (series 5, images 38-41) (series 8, image 62). Within the adjacent left paraspinal musculature/soft tissues, along the left posterolateral aspect of the cervical spine, there is a peripherally enhancing multiloculated collection which spans the C1-C4 levels, measures up to 2.9 x 1.9 cm in transaxial dimensions and 3.8 cm in craniocaudal dimension (series 3, image 36) (series 8, image 70). Upper chest: No consolidation within the imaged lung apices. Partially imaged small bilateral pleural effusions (greater on the left). These results will be called to the ordering clinician or representative by the Radiologist Assistant, and communication documented in the PACS or zVision Dashboard. IMPRESSION: 1. 2.9 x 1.9 x 3.8 cm multiloculated peripherally enhancing collection within the left posterior paraspinal musculature/soft tissues spanning the C1-C4 levels. Findings are most consistent with abscess. 2. Irregular lucency within the left C3-C4 articular pillars and also involving the left C4 lamina. Findings likely reflect septic facet joint/osteomyelitis given the adjacent abscess. 3. Diffuse edema within the neck soft tissues suggestive of anasarca. Superimposed cellulitis may be present on the left. 4. Nonspecific mildly enlarged right level 2 lymph node measuring 13 mm in short axis. 5. The right vertebral artery is intermittently poorly delineated suggestive of multifocal stenoses. 6. Poor dentition with multiple absent and carious teeth, as well as periapical lucency surrounding multiple teeth. 7. Maxillary sinus disease as described. Electronically Signed   By: Jackey Loge   On:  05/15/2019 15:11   Ir US Guide Bx Asp/drain  Result Date: 05/16/2019 INDICATION: 53 year old male with what appears to be an abscess in the deep musculature of the left posterior neck. He presents for ultrasound-guided aspiration for culture. EXAM: Ultrasound-guided aspiration MEDICATIONS: The patient is currently admitted to the hospital and receiving intravenous antibiotics. The antibiotics were administered within an appropriate time frame prior to the initiation of the procedure. ANESTHESIA/SEDATION: Fentanyl 50 mcg IV; Versed 1 mg IV Moderate Sedation Time:  10 minutes The patient was continuously monitored during the procedure by the interventional radiology nurse under my direct supervision. COMPLICATIONS: None immediate. PROCEDURE: Informed written consent was obtained from the patient after a thorough discussion of the procedural risks, benefits and alternatives. All questions were addressed. A timeout was performed prior to the initiation of the procedure. The region of clinical concern was interrogated with ultrasound. A small but complex fluid collection was successfully identified. The overlying skin was sterilely prepped and draped in the standard fashion using chlorhexidine skin prep. Local anesthesia was attained by infiltration with 1% lidocaine. Under real-time sonographic guidance, an 18 gauge trocar needle was carefully advanced and positioned with the tip in the fluid collection. Aspiration was then performed while agitating the trocar. Approximately 2 mL of thick purulent material was successfully aspirated. There was no immediate complication. The fluid was sent for Gram stain and culture. IMPRESSION: Successful ultrasound-guided aspiration of small fluid collection in the left posterior neck. Electronically Signed   By: Malachy Moan M.D.   On: 05/16/2019 16:53    Lab Data:  CBC: Recent Labs  Lab 05/26/19 0432 05/28/19 0702 05/29/19 0513 05/30/19 0512  WBC 7.5 6.4 6.8 7.0    NEUTROABS 4.7  --   --   --   HGB 8.7* 8.8* 8.9* 8.7*  HCT 28.4* 28.3* 29.3* 28.5*  MCV 99.0 99.3 100.7* 100.4*  PLT 289 274 261 256   Basic Metabolic Panel: Recent Labs  Lab 05/26/19 0432 05/28/19 0702 05/29/19 0513 05/30/19 0512  NA 137 136 137 138  K 4.3 4.3  4.2 4.3  CL 105 104 106 108  CO2 27 25 25 24   GLUCOSE 135* 151* 120* 246*  BUN 21* 21* 22* 23*  CREATININE 0.63 0.60* 0.71 0.68  CALCIUM 8.5* 8.4* 8.4* 8.5*  MG 1.9  --   --   --   PHOS 4.5  --   --   --    GFR: Estimated Creatinine Clearance: 69.3 mL/min (by C-G formula based on SCr of 0.68 mg/dL). Liver Function Tests: No results for input(s): AST, ALT, ALKPHOS, BILITOT, PROT, ALBUMIN in the last 168 hours. No results for input(s): LIPASE, AMYLASE in the last 168 hours. No results for input(s): AMMONIA in the last 168 hours. Coagulation Profile: No results for input(s): INR, PROTIME in the last 168 hours. Cardiac Enzymes: No results for input(s): CKTOTAL, CKMB, CKMBINDEX, TROPONINI in the last 168 hours. BNP (last 3 results) No results for input(s): PROBNP in the last 8760 hours. HbA1C: Recent Labs    05/29/19 0513  HGBA1C 14.3*   CBG: Recent Labs  Lab 05/29/19 1156 05/29/19 1612 05/29/19 2129 05/30/19 0807 05/30/19 1142  GLUCAP 193* 274* 214* 169* 126*   Lipid Profile: No results for input(s): CHOL, HDL, LDLCALC, TRIG, CHOLHDL, LDLDIRECT in the last 72 hours. Thyroid Function Tests: No results for input(s): TSH, T4TOTAL, FREET4, T3FREE, THYROIDAB in the last 72 hours. Anemia Panel: No results for input(s): VITAMINB12, FOLATE, FERRITIN, TIBC, IRON, RETICCTPCT in the last 72 hours. Urine analysis:    Component Value Date/Time   COLORURINE STRAW (A) 05/13/2019 2028   APPEARANCEUR HAZY (A) 05/13/2019 2028   LABSPEC 1.024 05/13/2019 2028   PHURINE 6.0 05/13/2019 2028   GLUCOSEU >=500 (A) 05/13/2019 2028   HGBUR SMALL (A) 05/13/2019 2028   BILIRUBINUR NEGATIVE 05/13/2019 2028   BILIRUBINUR  negative 03/15/2018 1049   BILIRUBINUR negative 12/26/2017 0941   KETONESUR NEGATIVE 05/13/2019 2028   PROTEINUR 30 (A) 05/13/2019 2028   UROBILINOGEN 0.2 03/15/2018 1049   UROBILINOGEN 0.2 12/08/2010 1613   NITRITE POSITIVE (A) 05/13/2019 2028   LEUKOCYTESUR LARGE (A) 05/13/2019 2028     Latham Kinzler M.D. [facility] 05/30/2019, 3:04 PM  Pager: [phone] Between 7am to 7pm - call Pager - [phone]  After 7pm go to [email or web address] - password [email or web address]  Call night coverage person covering after 7pm

## 2019-05-31 DIAGNOSIS — R739 Hyperglycemia, unspecified: Secondary | ICD-10-CM

## 2019-05-31 LAB — BASIC METABOLIC PANEL
Anion gap: 7 (ref 5–15)
BUN: 23 mg/dL — ABNORMAL HIGH (ref 6–20)
CO2: 23 mmol/L (ref 22–32)
Calcium: 8.4 mg/dL — ABNORMAL LOW (ref 8.9–10.3)
Chloride: 108 mmol/L (ref 98–111)
Creatinine, Ser: 0.61 mg/dL (ref 0.61–1.24)
GFR calc Af Amer: >60 mL/min (ref >60)
GFR calc non Af Amer: >60 mL/min (ref >60)
Glucose, Bld: 162 mg/dL — ABNORMAL HIGH (ref 70–99)
Potassium: 4.1 mmol/L (ref 3.5–5.1)
Sodium: 138 mmol/L (ref 135–145)

## 2019-05-31 LAB — GLUCOSE, CAPILLARY
Glucose-Capillary: 140 mg/dL — ABNORMAL HIGH (ref 70–99)
Glucose-Capillary: 116 mg/dL — ABNORMAL HIGH (ref 70–99)
Glucose-Capillary: 209 mg/dL — ABNORMAL HIGH (ref 70–99)
Glucose-Capillary: 120 mg/dL — ABNORMAL HIGH (ref 70–99)

## 2019-05-31 LAB — CBC
HCT: 27.3 % — ABNORMAL LOW (ref 39.0–52.0)
Hemoglobin: 8.2 g/dL — ABNORMAL LOW (ref 13.0–17.0)
MCH: 30.3 pg (ref 26.0–34.0)
MCHC: 30 g/dL (ref 30.0–36.0)
MCV: 100.7 fL — ABNORMAL HIGH (ref 80.0–100.0)
Platelets: 248 10*3/uL (ref 150–400)
RBC: 2.71 MIL/uL — ABNORMAL LOW (ref 4.22–5.81)
RDW: 13.5 % (ref 11.5–15.5)
WBC: 6.5 10*3/uL (ref 4.0–10.5)
nRBC: 0 % (ref 0.0–0.2)

## 2019-05-31 NOTE — Progress Notes (Signed)
[facility]                                                                              Patient Demographics  Rodney Barber, is a 53 y.o. male, DOB - [birth year], ZOX:[id number]  Admit date - 05/13/2019   Admitting Physician [name], MD  Outpatient Primary MD for the patient is [name], NP  Outpatient specialists:   LOS - 17  days   Medical records reviewed and are as summarized below:    Chief Complaint  Patient presents with   Hyperglycemia   Weakness       Brief summary   53 year old male with history of insulin-dependent diabetes, hypertension, chronic pancreatitis, smoker, recent prolonged admission in the hospital complicated with initial suicidal ideation later cleared, deconditioning, now homeless presents to the emergency room with extreme weakness and leg cramps, EMS found with blood sugars of 500.  In the emergency room blood pressures elevated.  Potassium less than 2.  EKG with U waves.  Patient admitted to the hospital with hyperglycemia and severe hypokalemia as well UTI like presentation. Patient stated that he was out of insulin for more than a week.  Patient had diarrhea secondary to taking lactulose at home for high ammonia.  After admission, his urine culture grew MSSA, was on Rocephin, he had left-sided neck abscess, that also grew MSSA.  Blood cultures negative, however it was drawn after being on Rocephin. Remains in the hospital to finish antibiotics, until 06/25/2019 due to history of IV drug abuse, unsafe to discharge with PICC line    Assessment & Plan    Electrolyte abnormalities including profound hypokalemia, hypophosphatemia -At the time of admission potassium less than 2.0, phosphorus less than 1.0 -Potassium, creatinine within normal limits.   MSSA UTI, MSSA left neck abscess, C-spine osteomyelitis, MSSA bacteremia -Urine culture with MSSA, left neck aspirate with MSSA, blood cultures 10/9 had shown  MSSA -Continue IV Ancef for 6 weeks.  TEE showed no vegetation -ID recommended continuing cefazolin until 11/18, reconsult before the discharge date to assess if patient needs continued oral antibiotics  Diabetes mellitus, brittle, uncontrolled IDDM with hypoglycemia -Hemoglobin A1c in 02/2019 was 17.7, repeat hemoglobin A1c 14.3 -Currently CBGs controlled.  Continue current regimen of NovoLog 3 units 3 times daily AC, sliding scale insulin, Lantus 6 units twice daily   Essential hypertension BP stable, continue amlodipine  History of depression Currently stable, no SI, continue Lexapro  History of smoking Continue nicotine patch, counseled on smoking cessation  Homeless -Case management assisting  Protein calorie malnutrition, BMI 15.57 -Registered dietitian consulted  Code Status: DNR DVT Prophylaxis: SCDs Family Communication: Discussed all imaging results, lab results, explained to the patient    Disposition Plan: Remains inpatient till completes IV antibiotics for 6 weeks due to history of IV drug abuse.  Currently no acute issues  Time Spent in minutes 15 minutes  Procedures:  Left neck aspiration  Consultants:   ID  Antimicrobials:   Anti-infectives (From admission, onward)   Start     Dose/Rate Route Frequency Ordered Stop   05/17/19 1400  ceFAZolin (ANCEF) IVPB 2g/100 mL premix  2 g 200 mL/hr over 30 Minutes Intravenous Every 8 hours 05/17/19 1353     05/16/19 0200  vancomycin (VANCOCIN) 500 mg in sodium chloride 0.9 % 100 mL IVPB  Status:  Discontinued     500 mg 100 mL/hr over 60 Minutes Intravenous Every 12 hours 05/15/19 1348 05/17/19 1337   05/15/19 1300  vancomycin (VANCOCIN) IVPB 1000 mg/200 mL premix     1,000 mg 200 mL/hr over 60 Minutes Intravenous  Once 05/15/19 1250 05/15/19 2003   05/14/19 2200  cefTRIAXone (ROCEPHIN) 1 g in sodium chloride 0.9 % 100 mL IVPB  Status:  Discontinued     1 g 200 mL/hr over 30 Minutes Intravenous Every 24  hours 05/13/19 2227 05/15/19 1152   05/13/19 2200  cefTRIAXone (ROCEPHIN) 2 g in sodium chloride 0.9 % 100 mL IVPB     2 g 200 mL/hr over 30 Minutes Intravenous  Once 05/13/19 2150 05/14/19 0014         Medications  Scheduled Meds:  amLODipine  5 mg Oral Daily   atorvastatin  20 mg Oral q1800   escitalopram  5 mg Oral Daily   feeding supplement (GLUCERNA SHAKE)  237 mL Oral BID BM   feeding supplement (PRO-STAT SUGAR FREE 64)  30 mL Oral BID   folic acid  1 mg Oral Daily   influenza vac split quadrivalent PF  0.5 mL Intramuscular Tomorrow-1000   insulin aspart  0-5 Units Subcutaneous QHS   insulin aspart  0-9 Units Subcutaneous TID WC   insulin aspart  3 Units Subcutaneous TID WC   insulin glargine  6 Units Subcutaneous BID   lipase/protease/amylase  12,000 Units Oral TID WC   lisinopril  10 mg Oral Daily   magnesium oxide  400 mg Oral BID   multivitamin with minerals  1 tablet Oral Daily   nicotine  14 mg Transdermal Daily   thiamine  100 mg Oral Daily   Continuous Infusions:  sodium chloride 250 mL (05/28/19 1513)    ceFAZolin (ANCEF) IV 2 g (05/31/19 0534)   PRN Meds:.sodium chloride, acetaminophen **OR** acetaminophen, hydrALAZINE, Muscle Rub, oxyCODONE-acetaminophen      Subjective:   Tywayne Shallenberger was seen and examined today.  No complaints, pain in the neck controlled.  No focal weakness.  Patient denies dizziness, chest pain, shortness of breath, abdominal pain, N/V/D/C, new weakness, numbess, tingling.   Objective:   Vitals:   05/30/19 1310 05/30/19 2016 05/31/19 0504 05/31/19 0821  BP: 127/74 128/80 129/69 124/74  Pulse: 76 80 73   Resp: 18 18 16    Temp:  98.3 F (36.8 C) 97.8 F (36.6 C)   TempSrc:      SpO2: 100% 100% 100%   Weight:      Height:        Intake/Output Summary (Last 24 hours) at 05/31/2019 1423 Last data filed at 05/30/2019 1500 Gross per 24 hour  Intake 180 ml  Output --  Net 180 ml     Wt Readings  from Last 3 Encounters:  05/20/19 46.4 kg  02/22/19 49.3 kg  12/04/18 59 kg     Physical Exam  General: Alert and oriented x 3, NAD  Eyes:   HEENT:    Cardiovascular: S1 S2 clear,  RRR. No pedal edema b/l  Respiratory: CTAB, no wheezing, rales or rhonchi  Gastrointestinal: Soft, nontender, nondistended, NBS  Ext: no pedal edema bilaterally  Neuro: no new deficits  Musculoskeletal: No cyanosis, clubbing  Skin: No rashes  Psych: Normal affect and demeanor, alert and oriented x3      Data Reviewed:  I have personally reviewed following labs and imaging studies  Micro Results Recent Results (from the past 240 hour(s))  Culture, blood (Routine X 2) w Reflex to ID Panel     Status: None (Preliminary result)   Collection Time: 05/28/19  4:28 PM   Specimen: BLOOD  Result Value Ref Range Status   Specimen Description   Final    BLOOD LEFT ANTECUBITAL Performed at [facility], [address] [place], [place] [address]    Special Requests   Final    BOTTLES DRAWN AEROBIC AND ANAEROBIC Blood Culture adequate volume Performed at [facility], [address] [place], [place] [address]    Culture   Final    NO GROWTH 3 DAYS Performed at [facility], [address], [place], [place] [address]    Report Status PENDING  Incomplete  Culture, blood (Routine X 2) w Reflex to ID Panel     Status: None (Preliminary result)   Collection Time: 05/28/19  4:34 PM   Specimen: BLOOD LEFT FOREARM  Result Value Ref Range Status   Specimen Description   Final    BLOOD LEFT FOREARM Performed at [facility], [address] [place], [place] [address]    Special Requests   Final    BOTTLES DRAWN AEROBIC AND ANAEROBIC Blood Culture adequate volume Performed at [facility], [address], [place], [place] [address]    Culture   Final    NO GROWTH 3 DAYS Performed at [facility], [address] [place], [place] [address]    Report Status PENDING  Incomplete    Radiology Reports Ct Soft Tissue Neck W Contrast  Result Date: 05/15/2019 CLINICAL DATA:  Back pain, minor trauma left posterior neck pain and soft tissue swelling, suspect hematoma, muscle strain. EXAM: CT NECK WITH CONTRAST TECHNIQUE: Multidetector CT imaging of the neck was performed using the standard protocol following the bolus administration of intravenous contrast. CONTRAST:  75mL OMNIPAQUE IOHEXOL 300 MG/ML  SOLN COMPARISON:  Noncontrast head CT 02/21/2019 FINDINGS: Pharynx and larynx: There is nonspecific diffuse edema within the neck soft tissues. There is poor dentition with multiple absent and carious teeth, as well as periapical lucency surrounding multiple teeth. No discrete mass is demonstrated within the nasopharynx, oral cavity, oropharynx, hypopharynx or larynx. No significant airway compromise at the imaged levels. Salivary glands: The bilateral parotid and submandibular glands are surrounded by nonspecific edema, but intrinsically unremarkable. Thyroid: Unremarkable Lymph nodes: Nonspecific mildly enlarged right level 2 lymph node measuring 12 mm in short axis (series 8, image 32). No other pathologically enlarged cervical chain lymph nodes identified. Vascular: The cervical right vertebral artery is at times poorly delineated suggestive of multifocal stenoses. The major vascular structures of the neck appear otherwise patent. Calcified plaque at the carotid bifurcations bilaterally. Limited intracranial: Atherosclerotic calcification of the carotid artery siphons. Otherwise unremarkable. Visualized orbits: No acute abnormality Mastoids and visualized paranasal sinuses: Mild mucosal thickening within the inferior maxillary sinuses bilaterally. There is a nonspecific calcified focus within the inferior left maxillary sinus, unchanged from prior head CT. No significant mastoid effusion at the imaged levels. Skeleton:  Moderate/severe cervical spondylosis with multilevel posterior disc osteophytes, uncovertebral and facet hypertrophy. This includes moderate/severe disc height loss at the C5-C6 and C6-C7 levels. There is irregular lucency within the left C3 and C4 articular  pillars and extending into the left C4 lamina (series 5, images 38-41) (series 8, image 62). Within the adjacent left paraspinal musculature/soft tissues, along the left posterolateral aspect of the cervical spine, there is a peripherally enhancing multiloculated collection which spans the C1-C4 levels, measures up to 2.9 x 1.9 cm in transaxial dimensions and 3.8 cm in craniocaudal dimension (series 3, image 36) (series 8, image 70). Upper chest: No consolidation within the imaged lung apices. Partially imaged small bilateral pleural effusions (greater on the left). These results will be called to the ordering clinician or representative by the Radiologist Assistant, and communication documented in the PACS or zVision Dashboard. IMPRESSION: 1. 2.9 x 1.9 x 3.8 cm multiloculated peripherally enhancing collection within the left posterior paraspinal musculature/soft tissues spanning the C1-C4 levels. Findings are most consistent with abscess. 2. Irregular lucency within the left C3-C4 articular pillars and also involving the left C4 lamina. Findings likely reflect septic facet joint/osteomyelitis given the adjacent abscess. 3. Diffuse edema within the neck soft tissues suggestive of anasarca. Superimposed cellulitis may be present on the left. 4. Nonspecific mildly enlarged right level 2 lymph node measuring 13 mm in short axis. 5. The right vertebral artery is intermittently poorly delineated suggestive of multifocal stenoses. 6. Poor dentition with multiple absent and carious teeth, as well as periapical lucency surrounding multiple teeth. 7. Maxillary sinus disease as described. Electronically Signed   By: Jackey Loge   On: 05/15/2019 15:11   Ir US Guide Bx  Asp/drain  Result Date: 05/16/2019 INDICATION: 53 year old male with what appears to be an abscess in the deep musculature of the left posterior neck. He presents for ultrasound-guided aspiration for culture. EXAM: Ultrasound-guided aspiration MEDICATIONS: The patient is currently admitted to the hospital and receiving intravenous antibiotics. The antibiotics were administered within an appropriate time frame prior to the initiation of the procedure. ANESTHESIA/SEDATION: Fentanyl 50 mcg IV; Versed 1 mg IV Moderate Sedation Time:  10 minutes The patient was continuously monitored during the procedure by the interventional radiology nurse under my direct supervision. COMPLICATIONS: None immediate. PROCEDURE: Informed written consent was obtained from the patient after a thorough discussion of the procedural risks, benefits and alternatives. All questions were addressed. A timeout was performed prior to the initiation of the procedure. The region of clinical concern was interrogated with ultrasound. A small but complex fluid collection was successfully identified. The overlying skin was sterilely prepped and draped in the standard fashion using chlorhexidine skin prep. Local anesthesia was attained by infiltration with 1% lidocaine. Under real-time sonographic guidance, an 18 gauge trocar needle was carefully advanced and positioned with the tip in the fluid collection. Aspiration was then performed while agitating the trocar. Approximately 2 mL of thick purulent material was successfully aspirated. There was no immediate complication. The fluid was sent for Gram stain and culture. IMPRESSION: Successful ultrasound-guided aspiration of small fluid collection in the left posterior neck. Electronically Signed   By: Malachy Moan M.D.   On: 05/16/2019 16:53    Lab Data:  CBC: Recent Labs  Lab 05/26/19 0432 05/28/19 0702 05/29/19 0513 05/30/19 0512 05/31/19 0518  WBC 7.5 6.4 6.8 7.0 6.5  NEUTROABS 4.7   --   --   --   --   HGB 8.7* 8.8* 8.9* 8.7* 8.2*  HCT 28.4* 28.3* 29.3* 28.5* 27.3*  MCV 99.0 99.3 100.7* 100.4* 100.7*  PLT 289 274 261 256 248   Basic Metabolic Panel: Recent Labs  Lab 05/26/19 0432 05/28/19 0702 05/29/19 0513 05/30/19  1610 05/31/19 0518  NA 137 136 137 138 138  K 4.3 4.3 4.2 4.3 4.1  CL 105 104 106 108 108  CO2 27 25 25 24 23   GLUCOSE 135* 151* 120* 246* 162*  BUN 21* 21* 22* 23* 23*  CREATININE 0.63 0.60* 0.71 0.68 0.61  CALCIUM 8.5* 8.4* 8.4* 8.5* 8.4*  MG 1.9  --   --   --   --   PHOS 4.5  --   --   --   --    GFR: Estimated Creatinine Clearance: 69.3 mL/min (by C-G formula based on SCr of 0.61 mg/dL). Liver Function Tests: No results for input(s): AST, ALT, ALKPHOS, BILITOT, PROT, ALBUMIN in the last 168 hours. No results for input(s): LIPASE, AMYLASE in the last 168 hours. No results for input(s): AMMONIA in the last 168 hours. Coagulation Profile: No results for input(s): INR, PROTIME in the last 168 hours. Cardiac Enzymes: No results for input(s): CKTOTAL, CKMB, CKMBINDEX, TROPONINI in the last 168 hours. BNP (last 3 results) No results for input(s): PROBNP in the last 8760 hours. HbA1C: Recent Labs    05/29/19 0513  HGBA1C 14.3*   CBG: Recent Labs  Lab 05/30/19 1142 05/30/19 1617 05/30/19 2013 05/31/19 0738 05/31/19 1128  GLUCAP 126* 296* 295* 140* 116*   Lipid Profile: No results for input(s): CHOL, HDL, LDLCALC, TRIG, CHOLHDL, LDLDIRECT in the last 72 hours. Thyroid Function Tests: No results for input(s): TSH, T4TOTAL, FREET4, T3FREE, THYROIDAB in the last 72 hours. Anemia Panel: No results for input(s): VITAMINB12, FOLATE, FERRITIN, TIBC, IRON, RETICCTPCT in the last 72 hours. Urine analysis:    Component Value Date/Time   COLORURINE STRAW (A) 05/13/2019 2028   APPEARANCEUR HAZY (A) 05/13/2019 2028   LABSPEC 1.024 05/13/2019 2028   PHURINE 6.0 05/13/2019 2028   GLUCOSEU >=500 (A) 05/13/2019 2028   HGBUR SMALL (A)  05/13/2019 2028   BILIRUBINUR NEGATIVE 05/13/2019 2028   BILIRUBINUR negative 03/15/2018 1049   BILIRUBINUR negative 12/26/2017 0941   KETONESUR NEGATIVE 05/13/2019 2028   PROTEINUR 30 (A) 05/13/2019 2028   UROBILINOGEN 0.2 03/15/2018 1049   UROBILINOGEN 0.2 12/08/2010 1613   NITRITE POSITIVE (A) 05/13/2019 2028   LEUKOCYTESUR LARGE (A) 05/13/2019 2028     Keene Gilkey M.D. [facility] 05/31/2019, 2:23 PM  Pager: [phone] Between 7am to 7pm - call Pager - [phone]  After 7pm go to [email or web address] - password [email or web address]  Call night coverage person covering after 7pm

## 2019-06-01 LAB — GLUCOSE, CAPILLARY
Glucose-Capillary: 75 mg/dL (ref 70–99)
Glucose-Capillary: 341 mg/dL — ABNORMAL HIGH (ref 70–99)
Glucose-Capillary: 154 mg/dL — ABNORMAL HIGH (ref 70–99)
Glucose-Capillary: 158 mg/dL — ABNORMAL HIGH (ref 70–99)

## 2019-06-01 NOTE — Progress Notes (Signed)
[facility]                                                                              Patient Demographics  Rodney Barber, is a 53 y.o. male, DOB - 27-Jan-1966, ZOX:[id number]  Admit date - 05/13/2019   Admitting Physician [name], MD  Outpatient Primary MD for the patient is [name], NP  Outpatient specialists:   LOS - 18  days   Medical records reviewed and are as summarized below:    Chief Complaint  Patient presents with   Hyperglycemia   Weakness       Brief summary   53 year old male with history of insulin-dependent diabetes, hypertension, chronic pancreatitis, smoker, recent prolonged admission in the hospital complicated with initial suicidal ideation later cleared, deconditioning, now homeless presents to the emergency room with extreme weakness and leg cramps, EMS found with blood sugars of 500.  In the emergency room blood pressures elevated.  Potassium less than 2.  EKG with U waves.  Patient admitted to the hospital with hyperglycemia and severe hypokalemia as well UTI like presentation. Patient stated that he was out of insulin for more than a week.  Patient had diarrhea secondary to taking lactulose at home for high ammonia.  After admission, his urine culture grew MSSA, was on Rocephin, he had left-sided neck abscess, that also grew MSSA.  Blood cultures negative, however it was drawn after being on Rocephin. Remains in the hospital to finish antibiotics, until 06/25/2019 due to history of IV drug abuse, unsafe to discharge with PICC line    Assessment & Plan    Electrolyte abnormalities including profound hypokalemia, hypophosphatemia -At the time of admission potassium less than 2.0, phosphorus less than 1.0 -Currently electrolytes in normal limits.   MSSA UTI, MSSA left neck abscess, C-spine osteomyelitis, MSSA bacteremia -Urine culture with MSSA, left neck aspirate with MSSA, blood cultures 10/9 had shown  MSSA -Continue IV Ancef for 6 weeks.  TEE showed no vegetation -ID recommended continuing cefazolin until 11/18, reconsult before the discharge date to assess if patient needs continued oral antibiotics  Diabetes mellitus, brittle, uncontrolled IDDM with hypoglycemia -Hemoglobin A1c in 02/2019 was 17.7, repeat hemoglobin A1c 14.3 -Brittle diabetes, CBGs ranging from 75-300  - Continue current regimen of NovoLog 3 units 3 times daily AC, sliding scale insulin, Lantus 6 units twice daily   Essential hypertension BP stable, continue amlodipine  History of depression Currently stable, no SI, continue Lexapro  History of smoking Continue nicotine patch, counseled on smoking cessation  Homeless -Case management assisting  Protein calorie malnutrition, BMI 15.57 -Registered dietitian consulted  Code Status: DNR DVT Prophylaxis: SCDs Family Communication: Discussed all imaging results, lab results, explained to the patient    Disposition Plan: Remains inpatient till completes IV antibiotics for 6 weeks due to history of IV drug abuse.  No acute issues  Time Spent in minutes 15 minutes  Procedures:  Left neck aspiration  Consultants:   ID  Antimicrobials:   Anti-infectives (From admission, onward)   Start     Dose/Rate Route Frequency Ordered Stop   05/17/19 1400  ceFAZolin (ANCEF) IVPB 2g/100  mL premix     2 g 200 mL/hr over 30 Minutes Intravenous Every 8 hours 05/17/19 1353     05/16/19 0200  vancomycin (VANCOCIN) 500 mg in sodium chloride 0.9 % 100 mL IVPB  Status:  Discontinued     500 mg 100 mL/hr over 60 Minutes Intravenous Every 12 hours 05/15/19 1348 05/17/19 1337   05/15/19 1300  vancomycin (VANCOCIN) IVPB 1000 mg/200 mL premix     1,000 mg 200 mL/hr over 60 Minutes Intravenous  Once 05/15/19 1250 05/15/19 2003   05/14/19 2200  cefTRIAXone (ROCEPHIN) 1 g in sodium chloride 0.9 % 100 mL IVPB  Status:  Discontinued     1 g 200 mL/hr over 30 Minutes Intravenous  Every 24 hours 05/13/19 2227 05/15/19 1152   05/13/19 2200  cefTRIAXone (ROCEPHIN) 2 g in sodium chloride 0.9 % 100 mL IVPB     2 g 200 mL/hr over 30 Minutes Intravenous  Once 05/13/19 2150 05/14/19 0014         Medications  Scheduled Meds:  amLODipine  5 mg Oral Daily   atorvastatin  20 mg Oral q1800   escitalopram  5 mg Oral Daily   feeding supplement (GLUCERNA SHAKE)  237 mL Oral BID BM   feeding supplement (PRO-STAT SUGAR FREE 64)  30 mL Oral BID   folic acid  1 mg Oral Daily   influenza vac split quadrivalent PF  0.5 mL Intramuscular Tomorrow-1000   insulin aspart  0-5 Units Subcutaneous QHS   insulin aspart  0-9 Units Subcutaneous TID WC   insulin aspart  3 Units Subcutaneous TID WC   insulin glargine  6 Units Subcutaneous BID   lipase/protease/amylase  12,000 Units Oral TID WC   lisinopril  10 mg Oral Daily   magnesium oxide  400 mg Oral BID   multivitamin with minerals  1 tablet Oral Daily   nicotine  14 mg Transdermal Daily   thiamine  100 mg Oral Daily   Continuous Infusions:  sodium chloride 250 mL (05/28/19 1513)    ceFAZolin (ANCEF) IV 2 g (06/01/19 0519)   PRN Meds:.sodium chloride, acetaminophen **OR** acetaminophen, hydrALAZINE, Muscle Rub, oxyCODONE-acetaminophen      Subjective:   Rodney Barber was seen and examined today.  No complaints, no neck pain, no focal weakness.    Patient denies dizziness, chest pain, shortness of breath, abdominal pain, N/V/D/C.  No fevers.  Objective:   Vitals:   05/31/19 0821 05/31/19 1428 05/31/19 2128 06/01/19 0454  BP: 124/74 121/79 133/76 131/71  Pulse:  77 76 66  Resp:  18 20 20   Temp:  98 F (36.7 C) 98.5 F (36.9 C) 98.1 F (36.7 C)  TempSrc:  Oral Oral Oral  SpO2:  100% 99% 98%  Weight:      Height:        Intake/Output Summary (Last 24 hours) at 06/01/2019 1409 Last data filed at 05/31/2019 2135 Gross per 24 hour  Intake 240 ml  Output --  Net 240 ml     Wt Readings  from Last 3 Encounters:  05/20/19 46.4 kg  02/22/19 49.3 kg  12/04/18 59 kg    Physical Exam  General: Alert and oriented x 3, NAD  Eyes:   HEENT:  Atraumatic,  Cardiovascular: S1 S2 clear, no murmurs, RRR. No pedal edema b/l  Respiratory: CTAB, no wheezing, rales or rhonchi  Gastrointestinal: Soft, nontender, nondistended, NBS  Ext: no pedal edema bilaterally  Neuro: no new deficits  Musculoskeletal: No cyanosis,  clubbing  Skin: No rashes  Psych: Normal affect and demeanor, alert and oriented x3     Data Reviewed:  I have personally reviewed following labs and imaging studies  Micro Results Recent Results (from the past 240 hour(s))  Culture, blood (Routine X 2) w Reflex to ID Panel     Status: None (Preliminary result)   Collection Time: 05/28/19  4:28 PM   Specimen: BLOOD  Result Value Ref Range Status   Specimen Description   Final    BLOOD LEFT ANTECUBITAL Performed at [facility], [address] [place], [place] [address]    Special Requests   Final    BOTTLES DRAWN AEROBIC AND ANAEROBIC Blood Culture adequate volume Performed at [facility], [address], [place], [place] [address]    Culture   Final    NO GROWTH 3 DAYS Performed at [facility], [address], [place], [place] [address]    Report Status PENDING  Incomplete  Culture, blood (Routine X 2) w Reflex to ID Panel     Status: None (Preliminary result)   Collection Time: 05/28/19  4:34 PM   Specimen: BLOOD LEFT FOREARM  Result Value Ref Range Status   Specimen Description   Final    BLOOD LEFT FOREARM Performed at [facility], [address] [place], [place] [address]    Special Requests   Final    BOTTLES DRAWN AEROBIC AND ANAEROBIC Blood Culture adequate volume Performed at [facility], [address], [place], [place] [address]    Culture   Final    NO GROWTH 3 DAYS Performed at [facility], [address], [place], [place] [address]    Report Status PENDING  Incomplete    Radiology Reports Ct Soft Tissue Neck W Contrast  Result Date: 05/15/2019 CLINICAL DATA:  Back pain, minor trauma left posterior neck pain and soft tissue swelling, suspect hematoma, muscle strain. EXAM: CT NECK WITH CONTRAST TECHNIQUE: Multidetector CT imaging of the neck was performed using the standard protocol following the bolus administration of intravenous contrast. CONTRAST:  75mL OMNIPAQUE IOHEXOL 300 MG/ML  SOLN COMPARISON:  Noncontrast head CT 02/21/2019 FINDINGS: Pharynx and larynx: There is nonspecific diffuse edema within the neck soft tissues. There is poor dentition with multiple absent and carious teeth, as well as periapical lucency surrounding multiple teeth. No discrete mass is demonstrated within the nasopharynx, oral cavity, oropharynx, hypopharynx or larynx. No significant airway compromise at the imaged levels. Salivary glands: The bilateral parotid and submandibular glands are surrounded by nonspecific edema, but intrinsically unremarkable. Thyroid: Unremarkable Lymph nodes: Nonspecific mildly enlarged right level 2 lymph node measuring 12 mm in short axis (series 8, image 32). No other pathologically enlarged cervical chain lymph nodes identified. Vascular: The cervical right vertebral artery is at times poorly delineated suggestive of multifocal stenoses. The major vascular structures of the neck appear otherwise patent. Calcified plaque at the carotid bifurcations bilaterally. Limited intracranial: Atherosclerotic calcification of the carotid artery siphons. Otherwise unremarkable. Visualized orbits: No acute abnormality Mastoids and visualized paranasal sinuses: Mild mucosal thickening within the inferior maxillary sinuses bilaterally. There is a nonspecific calcified focus within the inferior left maxillary sinus, unchanged from prior head CT. No significant mastoid effusion at the imaged  levels. Skeleton: Moderate/severe cervical spondylosis with multilevel posterior disc osteophytes, uncovertebral and facet hypertrophy. This includes moderate/severe disc height loss at the C5-C6 and C6-C7 levels. There is irregular lucency within the  left C3 and C4 articular pillars and extending into the left C4 lamina (series 5, images 38-41) (series 8, image 62). Within the adjacent left paraspinal musculature/soft tissues, along the left posterolateral aspect of the cervical spine, there is a peripherally enhancing multiloculated collection which spans the C1-C4 levels, measures up to 2.9 x 1.9 cm in transaxial dimensions and 3.8 cm in craniocaudal dimension (series 3, image 36) (series 8, image 70). Upper chest: No consolidation within the imaged lung apices. Partially imaged small bilateral pleural effusions (greater on the left). These results will be called to the ordering clinician or representative by the Radiologist Assistant, and communication documented in the PACS or zVision Dashboard. IMPRESSION: 1. 2.9 x 1.9 x 3.8 cm multiloculated peripherally enhancing collection within the left posterior paraspinal musculature/soft tissues spanning the C1-C4 levels. Findings are most consistent with abscess. 2. Irregular lucency within the left C3-C4 articular pillars and also involving the left C4 lamina. Findings likely reflect septic facet joint/osteomyelitis given the adjacent abscess. 3. Diffuse edema within the neck soft tissues suggestive of anasarca. Superimposed cellulitis may be present on the left. 4. Nonspecific mildly enlarged right level 2 lymph node measuring 13 mm in short axis. 5. The right vertebral artery is intermittently poorly delineated suggestive of multifocal stenoses. 6. Poor dentition with multiple absent and carious teeth, as well as periapical lucency surrounding multiple teeth. 7. Maxillary sinus disease as described. Electronically Signed   By: Jackey Loge   On: 05/15/2019 15:11    Ir US Guide Bx Asp/drain  Result Date: 05/16/2019 INDICATION: 53 year old male with what appears to be an abscess in the deep musculature of the left posterior neck. He presents for ultrasound-guided aspiration for culture. EXAM: Ultrasound-guided aspiration MEDICATIONS: The patient is currently admitted to the hospital and receiving intravenous antibiotics. The antibiotics were administered within an appropriate time frame prior to the initiation of the procedure. ANESTHESIA/SEDATION: Fentanyl 50 mcg IV; Versed 1 mg IV Moderate Sedation Time:  10 minutes The patient was continuously monitored during the procedure by the interventional radiology nurse under my direct supervision. COMPLICATIONS: None immediate. PROCEDURE: Informed written consent was obtained from the patient after a thorough discussion of the procedural risks, benefits and alternatives. All questions were addressed. A timeout was performed prior to the initiation of the procedure. The region of clinical concern was interrogated with ultrasound. A small but complex fluid collection was successfully identified. The overlying skin was sterilely prepped and draped in the standard fashion using chlorhexidine skin prep. Local anesthesia was attained by infiltration with 1% lidocaine. Under real-time sonographic guidance, an 18 gauge trocar needle was carefully advanced and positioned with the tip in the fluid collection. Aspiration was then performed while agitating the trocar. Approximately 2 mL of thick purulent material was successfully aspirated. There was no immediate complication. The fluid was sent for Gram stain and culture. IMPRESSION: Successful ultrasound-guided aspiration of small fluid collection in the left posterior neck. Electronically Signed   By: Malachy Moan M.D.   On: 05/16/2019 16:53    Lab Data:  CBC: Recent Labs  Lab 05/26/19 0432 05/28/19 0702 05/29/19 0513 05/30/19 0512 05/31/19 0518  WBC 7.5 6.4 6.8 7.0  6.5  NEUTROABS 4.7  --   --   --   --   HGB 8.7* 8.8* 8.9* 8.7* 8.2*  HCT 28.4* 28.3* 29.3* 28.5* 27.3*  MCV 99.0 99.3 100.7* 100.4* 100.7*  PLT 289 274 261 256 248   Basic Metabolic Panel: Recent Labs  Lab 05/26/19 0432  05/28/19 0702 05/29/19 0513 05/30/19 0512 05/31/19 0518  NA 137 136 137 138 138  K 4.3 4.3 4.2 4.3 4.1  CL 105 104 106 108 108  CO2 27 25 25 24 23   GLUCOSE 135* 151* 120* 246* 162*  BUN 21* 21* 22* 23* 23*  CREATININE 0.63 0.60* 0.71 0.68 0.61  CALCIUM 8.5* 8.4* 8.4* 8.5* 8.4*  MG 1.9  --   --   --   --   PHOS 4.5  --   --   --   --    GFR: Estimated Creatinine Clearance: 69.3 mL/min (by C-G formula based on SCr of 0.61 mg/dL). Liver Function Tests: No results for input(s): AST, ALT, ALKPHOS, BILITOT, PROT, ALBUMIN in the last 168 hours. No results for input(s): LIPASE, AMYLASE in the last 168 hours. No results for input(s): AMMONIA in the last 168 hours. Coagulation Profile: No results for input(s): INR, PROTIME in the last 168 hours. Cardiac Enzymes: No results for input(s): CKTOTAL, CKMB, CKMBINDEX, TROPONINI in the last 168 hours. BNP (last 3 results) No results for input(s): PROBNP in the last 8760 hours. HbA1C: No results for input(s): HGBA1C in the last 72 hours. CBG: Recent Labs  Lab 05/31/19 1128 05/31/19 1633 05/31/19 2123 06/01/19 0738 06/01/19 1204  GLUCAP 116* 209* 120* 75 341*   Lipid Profile: No results for input(s): CHOL, HDL, LDLCALC, TRIG, CHOLHDL, LDLDIRECT in the last 72 hours. Thyroid Function Tests: No results for input(s): TSH, T4TOTAL, FREET4, T3FREE, THYROIDAB in the last 72 hours. Anemia Panel: No results for input(s): VITAMINB12, FOLATE, FERRITIN, TIBC, IRON, RETICCTPCT in the last 72 hours. Urine analysis:    Component Value Date/Time   COLORURINE STRAW (A) 05/13/2019 2028   APPEARANCEUR HAZY (A) 05/13/2019 2028   LABSPEC 1.024 05/13/2019 2028   PHURINE 6.0 05/13/2019 2028   GLUCOSEU >=500 (A) 05/13/2019 2028    HGBUR SMALL (A) 05/13/2019 2028   BILIRUBINUR NEGATIVE 05/13/2019 2028   BILIRUBINUR negative 03/15/2018 1049   BILIRUBINUR negative 12/26/2017 0941   KETONESUR NEGATIVE 05/13/2019 2028   PROTEINUR 30 (A) 05/13/2019 2028   UROBILINOGEN 0.2 03/15/2018 1049   UROBILINOGEN 0.2 12/08/2010 1613   NITRITE POSITIVE (A) 05/13/2019 2028   LEUKOCYTESUR LARGE (A) 05/13/2019 2028     Patrisia Faeth M.D. [facility] 06/01/2019, 2:09 PM  Pager: [phone] Between 7am to 7pm - call Pager - [phone]  After 7pm go to [email or web address] - password [email or web address]  Call night coverage person covering after 7pm

## 2019-06-02 LAB — CULTURE, BLOOD (ROUTINE X 2)
Culture: NO GROWTH
Culture: NO GROWTH
Special Requests: ADEQUATE
Special Requests: ADEQUATE

## 2019-06-02 LAB — GLUCOSE, CAPILLARY
Glucose-Capillary: 143 mg/dL — ABNORMAL HIGH (ref 70–99)
Glucose-Capillary: 104 mg/dL — ABNORMAL HIGH (ref 70–99)
Glucose-Capillary: 153 mg/dL — ABNORMAL HIGH (ref 70–99)
Glucose-Capillary: 157 mg/dL — ABNORMAL HIGH (ref 70–99)

## 2019-06-02 NOTE — Progress Notes (Signed)
[facility]                                                                              Patient Demographics  Rodney Barber, is a 53 y.o. male, DOB - 06-14-1966, ZOX:[id number]  Admit date - 05/13/2019   Admitting Physician Rodney Giovanni, MD  Outpatient Primary MD for the patient is Rodney Rigg, NP  Outpatient specialists:   LOS - 19  days   Medical records reviewed and are as summarized below:    Chief Complaint  Patient presents with   Hyperglycemia   Weakness       Brief summary   53 year old male with history of insulin-dependent diabetes, hypertension, chronic pancreatitis, smoker, recent prolonged admission in the hospital complicated with initial suicidal ideation later cleared, deconditioning, now homeless presents to the emergency room with extreme weakness and leg cramps, EMS found with blood sugars of 500.  In the emergency room blood pressures elevated.  Potassium less than 2.  EKG with U waves.  Patient admitted to the hospital with hyperglycemia and severe hypokalemia as well UTI like presentation. Patient stated that he was out of insulin for more than a week.  Patient had diarrhea secondary to taking lactulose at home for high ammonia.  After admission, his urine culture grew MSSA, was on Rocephin, he had left-sided neck abscess, that also grew MSSA.  Blood cultures negative, however it was drawn after being on Rocephin. Remains in the hospital to finish antibiotics, until 06/25/2019 due to history of IV drug abuse, unsafe to discharge with PICC line    Assessment & Plan    Electrolyte abnormalities including profound hypokalemia, hypophosphatemia -At the time of admission potassium less than 2.0, phosphorus less than 1.0 -recheck BMET,mg, phos   MSSA UTI, MSSA left neck abscess, C-spine osteomyelitis, MSSA bacteremia -Urine culture with MSSA, left neck aspirate with MSSA, blood cultures 10/9 had shown MSSA -Continue IV Ancef  for 6 weeks.  TEE showed no vegetation -ID recommended continuing cefazolin until 11/18, reconsult before the discharge date to assess if patient needs continued oral antibiotics  Diabetes mellitus, brittle, uncontrolled IDDM with hypoglycemia -Hemoglobin A1c in 02/2019 was 17.7, repeat hemoglobin A1c 14.3 -Brittle diabetes, CBG's fairly stable  - Continue Novolog 3units TID AC, SSI, Lantus 6units BID   Essential hypertension BP stable, cont amlodipine, lisinopril   History of depression Currently stable, no SI, continue Lexapro  History of smoking Continue nicotine patch, counseled on smoking cessation  Homeless -Case management assisting  Protein calorie malnutrition, BMI 15.57 -Registered dietitian consulted  Code Status: DNR DVT Prophylaxis: SCDs Family Communication: Discussed all imaging results, lab results, explained to the patient    Disposition Plan: Remains inpatient till completes IV antibiotics for 6 weeks due to history of IV drug abuse. No acute issues.   Time Spent in minutes 15 minutes  Procedures:  Left neck aspiration  Consultants:   ID  Antimicrobials:   Anti-infectives (From admission, onward)   Start     Dose/Rate Route Frequency Ordered Stop   05/17/19 1400  ceFAZolin (ANCEF) IVPB 2g/100 mL premix     2 g 200 mL/hr over  30 Minutes Intravenous Every 8 hours 05/17/19 1353     05/16/19 0200  vancomycin (VANCOCIN) 500 mg in sodium chloride 0.9 % 100 mL IVPB  Status:  Discontinued     500 mg 100 mL/hr over 60 Minutes Intravenous Every 12 hours 05/15/19 1348 05/17/19 1337   05/15/19 1300  vancomycin (VANCOCIN) IVPB 1000 mg/200 mL premix     1,000 mg 200 mL/hr over 60 Minutes Intravenous  Once 05/15/19 1250 05/15/19 2003   05/14/19 2200  cefTRIAXone (ROCEPHIN) 1 g in sodium chloride 0.9 % 100 mL IVPB  Status:  Discontinued     1 g 200 mL/hr over 30 Minutes Intravenous Every 24 hours 05/13/19 2227 05/15/19 1152   05/13/19 2200  cefTRIAXone  (ROCEPHIN) 2 g in sodium chloride 0.9 % 100 mL IVPB     2 g 200 mL/hr over 30 Minutes Intravenous  Once 05/13/19 2150 05/14/19 0014         Medications  Scheduled Meds:  amLODipine  5 mg Oral Daily   atorvastatin  20 mg Oral q1800   escitalopram  5 mg Oral Daily   feeding supplement (GLUCERNA SHAKE)  237 mL Oral BID BM   feeding supplement (PRO-STAT SUGAR FREE 64)  30 mL Oral BID   folic acid  1 mg Oral Daily   influenza vac split quadrivalent PF  0.5 mL Intramuscular Tomorrow-1000   insulin aspart  0-5 Units Subcutaneous QHS   insulin aspart  0-9 Units Subcutaneous TID WC   insulin aspart  3 Units Subcutaneous TID WC   insulin glargine  6 Units Subcutaneous BID   lipase/protease/amylase  12,000 Units Oral TID WC   lisinopril  10 mg Oral Daily   magnesium oxide  400 mg Oral BID   multivitamin with minerals  1 tablet Oral Daily   nicotine  14 mg Transdermal Daily   thiamine  100 mg Oral Daily   Continuous Infusions:  sodium chloride 250 mL (05/28/19 1513)    ceFAZolin (ANCEF) IV 2 g (06/02/19 0534)   PRN Meds:.sodium chloride, acetaminophen **OR** acetaminophen, hydrALAZINE, Muscle Rub, oxyCODONE-acetaminophen      Subjective:   Rodney Barber was seen and examined today. No acute complaints, neck pain controlled. No focal weakness.  Patient denies dizziness, chest pain, shortness of breath, abdominal pain, N/V/D/C. No fevers.   Objective:   Vitals:   06/01/19 0454 06/01/19 1415 06/01/19 2004 06/02/19 0459  BP: 131/71 129/70 134/83 119/68  Pulse: 66 71 76 70  Resp: 20 18 18 18   Temp: 98.1 F (36.7 C) 98.7 F (37.1 C) 98.2 F (36.8 C) 97.9 F (36.6 C)  TempSrc: Oral Oral  Oral  SpO2: 98% 99% 99% 100%  Weight:      Height:        Intake/Output Summary (Last 24 hours) at 06/02/2019 1108 Last data filed at 06/02/2019 0908 Gross per 24 hour  Intake 360 ml  Output --  Net 360 ml     Wt Readings from Last 3 Encounters:  05/20/19  46.4 kg  02/22/19 49.3 kg  12/04/18 59 kg    Physical Exam  General: Alert and oriented x 3, NAD  Eyes:   HEENT:  Atraumatic, normocephalic  Cardiovascular: S1 S2 clear, RRR. No pedal edema b/l  Respiratory: CTAB  Gastrointestinal: Soft, nontender, nondistended, NBS  Ext: no pedal edema bilaterally  Neuro: no new deficits  Musculoskeletal: No cyanosis, clubbing  Skin: No rashes  Psych: Normal affect and demeanor, alert and oriented x3  Data Reviewed:  I have personally reviewed following labs and imaging studies  Micro Results Recent Results (from the past 240 hour(s))  Culture, blood (Routine X 2) w Reflex to ID Panel     Status: None   Collection Time: 05/28/19  4:28 PM   Specimen: BLOOD  Result Value Ref Range Status   Specimen Description   Final    BLOOD LEFT ANTECUBITAL Performed at [facility], [address], [place], [place] [address]    Special Requests   Final    BOTTLES DRAWN AEROBIC AND ANAEROBIC Blood Culture adequate volume Performed at [facility], [address], [place], [place] [address]    Culture   Final    NO GROWTH 5 DAYS Performed at [facility], [address] [place], [place] [address]    Report Status 06/02/2019 FINAL  Final  Culture, blood (Routine X 2) w Reflex to ID Panel     Status: None   Collection Time: 05/28/19  4:34 PM   Specimen: BLOOD LEFT FOREARM  Result Value Ref Range Status   Specimen Description   Final    BLOOD LEFT FOREARM Performed at [facility], [address] [place], [place] [address]    Special Requests   Final    BOTTLES DRAWN AEROBIC AND ANAEROBIC Blood Culture adequate volume Performed at [facility], [address], [place], [place] [address]    Culture   Final    NO GROWTH 5 DAYS Performed at [facility], [address] [place], [place] [address]    Report Status 06/02/2019 FINAL  Final     Radiology Reports Ct Soft Tissue Neck W Contrast  Result Date: 05/15/2019 CLINICAL DATA:  Back pain, minor trauma left posterior neck pain and soft tissue swelling, suspect hematoma, muscle strain. EXAM: CT NECK WITH CONTRAST TECHNIQUE: Multidetector CT imaging of the neck was performed using the standard protocol following the bolus administration of intravenous contrast. CONTRAST:  75mL OMNIPAQUE IOHEXOL 300 MG/ML  SOLN COMPARISON:  Noncontrast head CT 02/21/2019 FINDINGS: Pharynx and larynx: There is nonspecific diffuse edema within the neck soft tissues. There is poor dentition with multiple absent and carious teeth, as well as periapical lucency surrounding multiple teeth. No discrete mass is demonstrated within the nasopharynx, oral cavity, oropharynx, hypopharynx or larynx. No significant airway compromise at the imaged levels. Salivary glands: The bilateral parotid and submandibular glands are surrounded by nonspecific edema, but intrinsically unremarkable. Thyroid: Unremarkable Lymph nodes: Nonspecific mildly enlarged right level 2 lymph node measuring 12 mm in short axis (series 8, image 32). No other pathologically enlarged cervical chain lymph nodes identified. Vascular: The cervical right vertebral artery is at times poorly delineated suggestive of multifocal stenoses. The major vascular structures of the neck appear otherwise patent. Calcified plaque at the carotid bifurcations bilaterally. Limited intracranial: Atherosclerotic calcification of the carotid artery siphons. Otherwise unremarkable. Visualized orbits: No acute abnormality Mastoids and visualized paranasal sinuses: Mild mucosal thickening within the inferior maxillary sinuses bilaterally. There is a nonspecific calcified focus within the inferior left maxillary sinus, unchanged from prior head CT. No significant mastoid effusion at the imaged levels. Skeleton: Moderate/severe cervical spondylosis with multilevel posterior disc  osteophytes, uncovertebral and facet hypertrophy. This includes moderate/severe disc height loss at the C5-C6 and C6-C7 levels. There is irregular lucency within the left C3 and C4 articular pillars and extending into the left C4 lamina (series 5, images 38-41) (series 8, image 62).  Within the adjacent left paraspinal musculature/soft tissues, along the left posterolateral aspect of the cervical spine, there is a peripherally enhancing multiloculated collection which spans the C1-C4 levels, measures up to 2.9 x 1.9 cm in transaxial dimensions and 3.8 cm in craniocaudal dimension (series 3, image 36) (series 8, image 70). Upper chest: No consolidation within the imaged lung apices. Partially imaged small bilateral pleural effusions (greater on the left). These results will be called to the ordering clinician or representative by the Radiologist Assistant, and communication documented in the PACS or zVision Dashboard. IMPRESSION: 1. 2.9 x 1.9 x 3.8 cm multiloculated peripherally enhancing collection within the left posterior paraspinal musculature/soft tissues spanning the C1-C4 levels. Findings are most consistent with abscess. 2. Irregular lucency within the left C3-C4 articular pillars and also involving the left C4 lamina. Findings likely reflect septic facet joint/osteomyelitis given the adjacent abscess. 3. Diffuse edema within the neck soft tissues suggestive of anasarca. Superimposed cellulitis may be present on the left. 4. Nonspecific mildly enlarged right level 2 lymph node measuring 13 mm in short axis. 5. The right vertebral artery is intermittently poorly delineated suggestive of multifocal stenoses. 6. Poor dentition with multiple absent and carious teeth, as well as periapical lucency surrounding multiple teeth. 7. Maxillary sinus disease as described. Electronically Signed   By: Jackey Loge   On: 05/15/2019 15:11   Ir US Guide Bx Asp/drain  Result Date: 05/16/2019 INDICATION: 53 year old male  with what appears to be an abscess in the deep musculature of the left posterior neck. He presents for ultrasound-guided aspiration for culture. EXAM: Ultrasound-guided aspiration MEDICATIONS: The patient is currently admitted to the hospital and receiving intravenous antibiotics. The antibiotics were administered within an appropriate time frame prior to the initiation of the procedure. ANESTHESIA/SEDATION: Fentanyl 50 mcg IV; Versed 1 mg IV Moderate Sedation Time:  10 minutes The patient was continuously monitored during the procedure by the interventional radiology nurse under my direct supervision. COMPLICATIONS: None immediate. PROCEDURE: Informed written consent was obtained from the patient after a thorough discussion of the procedural risks, benefits and alternatives. All questions were addressed. A timeout was performed prior to the initiation of the procedure. The region of clinical concern was interrogated with ultrasound. A small but complex fluid collection was successfully identified. The overlying skin was sterilely prepped and draped in the standard fashion using chlorhexidine skin prep. Local anesthesia was attained by infiltration with 1% lidocaine. Under real-time sonographic guidance, an 18 gauge trocar needle was carefully advanced and positioned with the tip in the fluid collection. Aspiration was then performed while agitating the trocar. Approximately 2 mL of thick purulent material was successfully aspirated. There was no immediate complication. The fluid was sent for Gram stain and culture. IMPRESSION: Successful ultrasound-guided aspiration of small fluid collection in the left posterior neck. Electronically Signed   By: Malachy Moan M.D.   On: 05/16/2019 16:53    Lab Data:  CBC: Recent Labs  Lab 05/28/19 0702 05/29/19 0513 05/30/19 0512 05/31/19 0518  WBC 6.4 6.8 7.0 6.5  HGB 8.8* 8.9* 8.7* 8.2*  HCT 28.3* 29.3* 28.5* 27.3*  MCV 99.3 100.7* 100.4* 100.7*  PLT 274 261  256 248   Basic Metabolic Panel: Recent Labs  Lab 05/28/19 0702 05/29/19 0513 05/30/19 0512 05/31/19 0518  NA 136 137 138 138  K 4.3 4.2 4.3 4.1  CL 104 106 108 108  CO2 25 25 24 23   GLUCOSE 151* 120* 246* 162*  BUN 21* 22* 23* 23*  CREATININE 0.60* 0.71 0.68 0.61  CALCIUM 8.4* 8.4* 8.5* 8.4*   GFR: Estimated Creatinine Clearance: 69.3 mL/min (by C-G formula based on SCr of 0.61 mg/dL). Liver Function Tests: No results for input(s): AST, ALT, ALKPHOS, BILITOT, PROT, ALBUMIN in the last 168 hours. No results for input(s): LIPASE, AMYLASE in the last 168 hours. No results for input(s): AMMONIA in the last 168 hours. Coagulation Profile: No results for input(s): INR, PROTIME in the last 168 hours. Cardiac Enzymes: No results for input(s): CKTOTAL, CKMB, CKMBINDEX, TROPONINI in the last 168 hours. BNP (last 3 results) No results for input(s): PROBNP in the last 8760 hours. HbA1C: No results for input(s): HGBA1C in the last 72 hours. CBG: Recent Labs  Lab 06/01/19 0738 06/01/19 1204 06/01/19 1639 06/01/19 2025 06/02/19 0812  GLUCAP 75 341* 154* 158* 143*   Lipid Profile: No results for input(s): CHOL, HDL, LDLCALC, TRIG, CHOLHDL, LDLDIRECT in the last 72 hours. Thyroid Function Tests: No results for input(s): TSH, T4TOTAL, FREET4, T3FREE, THYROIDAB in the last 72 hours. Anemia Panel: No results for input(s): VITAMINB12, FOLATE, FERRITIN, TIBC, IRON, RETICCTPCT in the last 72 hours. Urine analysis:    Component Value Date/Time   COLORURINE STRAW (A) 05/13/2019 2028   APPEARANCEUR HAZY (A) 05/13/2019 2028   LABSPEC 1.024 05/13/2019 2028   PHURINE 6.0 05/13/2019 2028   GLUCOSEU >=500 (A) 05/13/2019 2028   HGBUR SMALL (A) 05/13/2019 2028   BILIRUBINUR NEGATIVE 05/13/2019 2028   BILIRUBINUR negative 03/15/2018 1049   BILIRUBINUR negative 12/26/2017 0941   KETONESUR NEGATIVE 05/13/2019 2028   PROTEINUR 30 (A) 05/13/2019 2028   UROBILINOGEN 0.2 03/15/2018 1049    UROBILINOGEN 0.2 12/08/2010 1613   NITRITE POSITIVE (A) 05/13/2019 2028   LEUKOCYTESUR LARGE (A) 05/13/2019 2028     Jayleana Colberg M.D. [facility] 06/02/2019, 11:08 AM  Pager: [phone] Between 7am to 7pm - call Pager - [phone]  After 7pm go to [email or web address] - password [email or web address]  Call night coverage person covering after 7pm

## 2019-06-02 NOTE — Progress Notes (Signed)
Nutrition Follow-up  DOCUMENTATION CODES:   Underweight, Severe malnutrition in context of chronic illness  INTERVENTION:  - continue Glucerna Shake BID and 30 ml prostat BID. - continue to encourage PO intakes.  - weigh patient today.    NUTRITION DIAGNOSIS:   Severe Malnutrition related to chronic illness as evidenced by severe fat depletion, severe muscle depletion. -ongoing  GOAL:   Patient will meet greater than or equal to 90% of their needs -met on average   MONITOR:   PO intake, Supplement acceptance, Labs, Weight trends  ASSESSMENT:   53 year old male with history of insulin-dependent DM, HTN, chronic pancreatitis, smoker, and recent prolonged hospitalization complicated by suicidal ideation later cleared, deconditioning, now homeless. He presented to the ED on 10/6 with extreme weakness and leg cramps. EMS found CBG to be >500 mg/dl and in the ED he was hypertensive. He was admitted for hyperglycemia and hypokalemia. Patient reported being out of insulin x1 week PTA and having diarrhea at home 2/2 taking lactulose.  Patient continues to mainly eat 100% of meals. He has been accepting all bottles of Glucerna Shake and all packets of prostat since RD assessment on 10/20. Patient has not been weighed since 10/13.   Plan for patient to remain inpatient for 6 weeks IV abx. Notes indicate that patient is a brittle diabetic with uncontrolled CBGs and that repeat A1c this admission was 14.3% (down from 17.7% in July).     Labs reviewed; CBGs: 143 and 104 mg/dl, BUN: 23 mg/dl, Ca: 8.4 mg/dl.  Medications reviewed; 1 mg oral folvite/day, sliding scale novolog, 3 units novolog TID, 6 units lantus BID, 79038 units creon TID, 400 mg mag-ox BID, daily multivitamin with minerals, 100 mg thiamine/day.     Diet Order:   Diet Order            Diet Carb Modified Fluid consistency: Thin; Room service appropriate? Yes  Diet effective now              EDUCATION NEEDS:   Not  appropriate for education at this time  Skin:  Skin Assessment: Reviewed RN Assessment  Last BM:  10/25  Height:   Ht Readings from Last 1 Encounters:  05/20/19 5' 8"  (1.727 m)    Weight:   Wt Readings from Last 1 Encounters:  05/20/19 46.4 kg    Ideal Body Weight:  70 kg  BMI:  Body mass index is 15.57 kg/m.  Estimated Nutritional Needs:   Kcal:  1855-2090 kcal (40-45 kcal/kg)  Protein:  80-90 grams  Fluid:  >/= 2 L/day     Jarome Matin, MS, RD, LDN, [facility] In[facility] Clinical Dietitian Pager # [phone] After hours/weekend pager # [phone]

## 2019-06-03 LAB — CBC
HCT: 26.8 % — ABNORMAL LOW (ref 39.0–52.0)
Hemoglobin: 8.2 g/dL — ABNORMAL LOW (ref 13.0–17.0)
MCH: 30.6 pg (ref 26.0–34.0)
MCHC: 30.6 g/dL (ref 30.0–36.0)
MCV: 100 fL (ref 80.0–100.0)
Platelets: 230 10*3/uL (ref 150–400)
RBC: 2.68 MIL/uL — ABNORMAL LOW (ref 4.22–5.81)
RDW: 14 % (ref 11.5–15.5)
WBC: 6 10*3/uL (ref 4.0–10.5)
nRBC: 0 % (ref 0.0–0.2)

## 2019-06-03 LAB — BASIC METABOLIC PANEL
Anion gap: 6 (ref 5–15)
BUN: 21 mg/dL — ABNORMAL HIGH (ref 6–20)
CO2: 24 mmol/L (ref 22–32)
Calcium: 8.9 mg/dL (ref 8.9–10.3)
Chloride: 109 mmol/L (ref 98–111)
Creatinine, Ser: 0.66 mg/dL (ref 0.61–1.24)
GFR calc Af Amer: >60 mL/min (ref >60)
GFR calc non Af Amer: >60 mL/min (ref >60)
Glucose, Bld: 182 mg/dL — ABNORMAL HIGH (ref 70–99)
Potassium: 4.1 mmol/L (ref 3.5–5.1)
Sodium: 139 mmol/L (ref 135–145)

## 2019-06-03 LAB — GLUCOSE, CAPILLARY
Glucose-Capillary: 138 mg/dL — ABNORMAL HIGH (ref 70–99)
Glucose-Capillary: 135 mg/dL — ABNORMAL HIGH (ref 70–99)
Glucose-Capillary: 174 mg/dL — ABNORMAL HIGH (ref 70–99)
Glucose-Capillary: 137 mg/dL — ABNORMAL HIGH (ref 70–99)

## 2019-06-03 LAB — PHOSPHORUS: Phosphorus: 5.1 mg/dL — ABNORMAL HIGH (ref 2.5–4.6)

## 2019-06-03 LAB — MAGNESIUM: Magnesium: 1.8 mg/dL (ref 1.7–2.4)

## 2019-06-03 NOTE — Progress Notes (Signed)
[facility]                                                                              Patient Demographics  Rodney Barber, is a 53 y.o. male, DOB - 1966-06-07, ZOX:[id number]  Admit date - 05/13/2019   Admitting Physician [name], MD  Outpatient Primary MD for the patient is [name], NP  Outpatient specialists:   LOS - 20  days   Medical records reviewed and are as summarized below:    Chief Complaint  Patient presents with   Hyperglycemia   Weakness       Brief summary   53 year old male with history of insulin-dependent diabetes, hypertension, chronic pancreatitis, smoker, recent prolonged admission in the hospital complicated with initial suicidal ideation later cleared, deconditioning, now homeless presents to the emergency room with extreme weakness and leg cramps, EMS found with blood sugars of 500.  In the emergency room blood pressures elevated.  Potassium less than 2.  EKG with U waves.  Patient admitted to the hospital with hyperglycemia and severe hypokalemia as well UTI like presentation. Patient stated that he was out of insulin for more than a week.  Patient had diarrhea secondary to taking lactulose at home for high ammonia.  After admission, his urine culture grew MSSA, was on Rocephin, he had left-sided neck abscess, that also grew MSSA.  Blood cultures negative, however it was drawn after being on Rocephin. Remains in the hospital to finish antibiotics, until 06/25/2019 due to history of IV drug abuse, unsafe to discharge with PICC line    Assessment & Plan    Electrolyte abnormalities including profound hypokalemia, hypophosphatemia -At the time of admission potassium less than 2.0, phosphorus less than 1.0 -Electrolytes within normal limits,phos slightly elevated 5.1, K, mag stable   MSSA UTI, MSSA left neck abscess, C-spine osteomyelitis, MSSA bacteremia -Urine culture with MSSA, left neck aspirate with MSSA,  blood cultures 10/9 had shown MSSA -Continue IV Ancef for 6 weeks.  TEE showed no vegetation -ID recommended continuing cefazolin until 11/18, reconsult before the discharge date to assess if patient needs continued oral antibiotics -Continue IV cefazolin, no complaints  Diabetes mellitus, brittle, uncontrolled IDDM with hypoglycemia -Hemoglobin A1c in 02/2019 was 17.7, repeat hemoglobin A1c 14.3 -CBGs fairly stable, continue NovoLog 3 units 3 times daily AC, SSI, Lantus 6 units twice daily  Essential hypertension BP stable, continue lisinopril, amlodipine  History of depression Currently stable, no SI, continue Lexapro  History of smoking Continue nicotine patch, counseled on smoking cessation  Homeless -Case management assisting  Protein calorie malnutrition, BMI 15.57 -Registered dietitian consulted  Code Status: DNR DVT Prophylaxis: SCDs Family Communication: Discussed all imaging results, lab results, explained to the patient    Disposition Plan: Remains inpatient till completes IV antibiotics for 6 weeks, till 06/25/2019 due to history of IV drug abuse.  No acute issues  Time Spent in minutes 15 minutes  Procedures:  Left neck aspiration  Consultants:   ID  Antimicrobials:   Anti-infectives (From admission, onward)   Start     Dose/Rate Route Frequency Ordered Stop   05/17/19 1400  ceFAZolin (ANCEF)  IVPB 2g/100 mL premix     2 g 200 mL/hr over 30 Minutes Intravenous Every 8 hours 05/17/19 1353     05/16/19 0200  vancomycin (VANCOCIN) 500 mg in sodium chloride 0.9 % 100 mL IVPB  Status:  Discontinued     500 mg 100 mL/hr over 60 Minutes Intravenous Every 12 hours 05/15/19 1348 05/17/19 1337   05/15/19 1300  vancomycin (VANCOCIN) IVPB 1000 mg/200 mL premix     1,000 mg 200 mL/hr over 60 Minutes Intravenous  Once 05/15/19 1250 05/15/19 2003   05/14/19 2200  cefTRIAXone (ROCEPHIN) 1 g in sodium chloride 0.9 % 100 mL IVPB  Status:  Discontinued     1 g 200  mL/hr over 30 Minutes Intravenous Every 24 hours 05/13/19 2227 05/15/19 1152   05/13/19 2200  cefTRIAXone (ROCEPHIN) 2 g in sodium chloride 0.9 % 100 mL IVPB     2 g 200 mL/hr over 30 Minutes Intravenous  Once 05/13/19 2150 05/14/19 0014         Medications  Scheduled Meds:  amLODipine  5 mg Oral Daily   atorvastatin  20 mg Oral q1800   escitalopram  5 mg Oral Daily   feeding supplement (GLUCERNA SHAKE)  237 mL Oral BID BM   feeding supplement (PRO-STAT SUGAR FREE 64)  30 mL Oral BID   folic acid  1 mg Oral Daily   influenza vac split quadrivalent PF  0.5 mL Intramuscular Tomorrow-1000   insulin aspart  0-5 Units Subcutaneous QHS   insulin aspart  0-9 Units Subcutaneous TID WC   insulin aspart  3 Units Subcutaneous TID WC   insulin glargine  6 Units Subcutaneous BID   lipase/protease/amylase  12,000 Units Oral TID WC   lisinopril  10 mg Oral Daily   magnesium oxide  400 mg Oral BID   multivitamin with minerals  1 tablet Oral Daily   nicotine  14 mg Transdermal Daily   thiamine  100 mg Oral Daily   Continuous Infusions:  sodium chloride 250 mL (05/28/19 1513)    ceFAZolin (ANCEF) IV 2 g (06/03/19 0631)   PRN Meds:.sodium chloride, acetaminophen **OR** acetaminophen, hydrALAZINE, Muscle Rub, oxyCODONE-acetaminophen      Subjective:   Rodney Barber was seen and examined today.  No acute complaints, no neck pain, no focal weakness.  No fevers or chills.   Patient denies dizziness, chest pain, shortness of breath, abdominal pain, N/V/D/C. No fevers.   Objective:   Vitals:   06/02/19 1916 06/02/19 2023 06/03/19 0337 06/03/19 0904  BP:  136/75 (!) 144/79 113/64  Pulse:  76 83 76  Resp:  18 18 17   Temp:  98.1 F (36.7 C) 98.6 F (37 C) 98.1 F (36.7 C)  TempSrc:  Oral Oral Oral  SpO2:  98% 99% 100%  Weight: 51.7 kg     Height:        Intake/Output Summary (Last 24 hours) at 06/03/2019 1133 Last data filed at 06/02/2019 2249 Gross per 24  hour  Intake 298.46 ml  Output --  Net 298.46 ml     Wt Readings from Last 3 Encounters:  06/02/19 51.7 kg  02/22/19 49.3 kg  12/04/18 59 kg    Physical Exam  General: Alert and oriented x 3, NAD  Eyes:   HEENT:  Atraumatic, normocephalic  Cardiovascular: S1 S2 clear, RRR. No pedal edema b/l  Respiratory: CTAB, no wheezing, rales or rhonchi  Gastrointestinal: Soft, nontender, nondistended, NBS  Ext: no pedal edema bilaterally  Neuro: no new deficits  Musculoskeletal: No cyanosis, clubbing  Skin: No rashes  Psych: Normal affect and demeanor, alert and oriented x3     Data Reviewed:  I have personally reviewed following labs and imaging studies  Micro Results Recent Results (from the past 240 hour(s))  Culture, blood (Routine X 2) w Reflex to ID Panel     Status: None   Collection Time: 05/28/19  4:28 PM   Specimen: BLOOD  Result Value Ref Range Status   Specimen Description   Final    BLOOD LEFT ANTECUBITAL Performed at [facility], [address], [place], [place] [address]    Special Requests   Final    BOTTLES DRAWN AEROBIC AND ANAEROBIC Blood Culture adequate volume Performed at [facility], [address] [place], [place] [address]    Culture   Final    NO GROWTH 5 DAYS Performed at [facility], [address] [place], [place] [address]    Report Status 06/02/2019 FINAL  Final  Culture, blood (Routine X 2) w Reflex to ID Panel     Status: None   Collection Time: 05/28/19  4:34 PM   Specimen: BLOOD LEFT FOREARM  Result Value Ref Range Status   Specimen Description   Final    BLOOD LEFT FOREARM Performed at [facility], [address] [place], [place] [address]    Special Requests   Final    BOTTLES DRAWN AEROBIC AND ANAEROBIC Blood Culture adequate volume Performed at [facility], [address], [place], [place] [address]    Culture   Final    NO  GROWTH 5 DAYS Performed at [facility], [address] [place], [place] [address]    Report Status 06/02/2019 FINAL  Final    Radiology Reports Ct Soft Tissue Neck W Contrast  Result Date: 05/15/2019 CLINICAL DATA:  Back pain, minor trauma left posterior neck pain and soft tissue swelling, suspect hematoma, muscle strain. EXAM: CT NECK WITH CONTRAST TECHNIQUE: Multidetector CT imaging of the neck was performed using the standard protocol following the bolus administration of intravenous contrast. CONTRAST:  75mL OMNIPAQUE IOHEXOL 300 MG/ML  SOLN COMPARISON:  Noncontrast head CT 02/21/2019 FINDINGS: Pharynx and larynx: There is nonspecific diffuse edema within the neck soft tissues. There is poor dentition with multiple absent and carious teeth, as well as periapical lucency surrounding multiple teeth. No discrete mass is demonstrated within the nasopharynx, oral cavity, oropharynx, hypopharynx or larynx. No significant airway compromise at the imaged levels. Salivary glands: The bilateral parotid and submandibular glands are surrounded by nonspecific edema, but intrinsically unremarkable. Thyroid: Unremarkable Lymph nodes: Nonspecific mildly enlarged right level 2 lymph node measuring 12 mm in short axis (series 8, image 32). No other pathologically enlarged cervical chain lymph nodes identified. Vascular: The cervical right vertebral artery is at times poorly delineated suggestive of multifocal stenoses. The major vascular structures of the neck appear otherwise patent. Calcified plaque at the carotid bifurcations bilaterally. Limited intracranial: Atherosclerotic calcification of the carotid artery siphons. Otherwise unremarkable. Visualized orbits: No acute abnormality Mastoids and visualized paranasal sinuses: Mild mucosal thickening within the inferior maxillary sinuses bilaterally. There is a nonspecific calcified focus within the inferior left maxillary sinus, unchanged from prior head CT. No  significant mastoid effusion at the imaged levels. Skeleton: Moderate/severe cervical spondylosis with multilevel posterior disc osteophytes, uncovertebral and facet hypertrophy. This includes moderate/severe disc height loss at the C5-C6 and C6-C7 levels.  There is irregular lucency within the left C3 and C4 articular pillars and extending into the left C4 lamina (series 5, images 38-41) (series 8, image 62). Within the adjacent left paraspinal musculature/soft tissues, along the left posterolateral aspect of the cervical spine, there is a peripherally enhancing multiloculated collection which spans the C1-C4 levels, measures up to 2.9 x 1.9 cm in transaxial dimensions and 3.8 cm in craniocaudal dimension (series 3, image 36) (series 8, image 70). Upper chest: No consolidation within the imaged lung apices. Partially imaged small bilateral pleural effusions (greater on the left). These results will be called to the ordering clinician or representative by the Radiologist Assistant, and communication documented in the PACS or zVision Dashboard. IMPRESSION: 1. 2.9 x 1.9 x 3.8 cm multiloculated peripherally enhancing collection within the left posterior paraspinal musculature/soft tissues spanning the C1-C4 levels. Findings are most consistent with abscess. 2. Irregular lucency within the left C3-C4 articular pillars and also involving the left C4 lamina. Findings likely reflect septic facet joint/osteomyelitis given the adjacent abscess. 3. Diffuse edema within the neck soft tissues suggestive of anasarca. Superimposed cellulitis may be present on the left. 4. Nonspecific mildly enlarged right level 2 lymph node measuring 13 mm in short axis. 5. The right vertebral artery is intermittently poorly delineated suggestive of multifocal stenoses. 6. Poor dentition with multiple absent and carious teeth, as well as periapical lucency surrounding multiple teeth. 7. Maxillary sinus disease as described. Electronically Signed    By: Jackey Loge   On: 05/15/2019 15:11   Ir US Guide Bx Asp/drain  Result Date: 05/16/2019 INDICATION: 53 year old male with what appears to be an abscess in the deep musculature of the left posterior neck. He presents for ultrasound-guided aspiration for culture. EXAM: Ultrasound-guided aspiration MEDICATIONS: The patient is currently admitted to the hospital and receiving intravenous antibiotics. The antibiotics were administered within an appropriate time frame prior to the initiation of the procedure. ANESTHESIA/SEDATION: Fentanyl 50 mcg IV; Versed 1 mg IV Moderate Sedation Time:  10 minutes The patient was continuously monitored during the procedure by the interventional radiology nurse under my direct supervision. COMPLICATIONS: None immediate. PROCEDURE: Informed written consent was obtained from the patient after a thorough discussion of the procedural risks, benefits and alternatives. All questions were addressed. A timeout was performed prior to the initiation of the procedure. The region of clinical concern was interrogated with ultrasound. A small but complex fluid collection was successfully identified. The overlying skin was sterilely prepped and draped in the standard fashion using chlorhexidine skin prep. Local anesthesia was attained by infiltration with 1% lidocaine. Under real-time sonographic guidance, an 18 gauge trocar needle was carefully advanced and positioned with the tip in the fluid collection. Aspiration was then performed while agitating the trocar. Approximately 2 mL of thick purulent material was successfully aspirated. There was no immediate complication. The fluid was sent for Gram stain and culture. IMPRESSION: Successful ultrasound-guided aspiration of small fluid collection in the left posterior neck. Electronically Signed   By: Malachy Moan M.D.   On: 05/16/2019 16:53    Lab Data:  CBC: Recent Labs  Lab 05/28/19 0702 05/29/19 0513 05/30/19 0512 05/31/19 0518  06/03/19 0522  WBC 6.4 6.8 7.0 6.5 6.0  HGB 8.8* 8.9* 8.7* 8.2* 8.2*  HCT 28.3* 29.3* 28.5* 27.3* 26.8*  MCV 99.3 100.7* 100.4* 100.7* 100.0  PLT 274 261 256 248 230   Basic Metabolic Panel: Recent Labs  Lab 05/28/19 0702 05/29/19 0513 05/30/19 0512 05/31/19 0518 06/03/19 0522  NA 136 137 138 138 139  K 4.3 4.2 4.3 4.1 4.1  CL 104 106 108 108 109  CO2 25 25 24 23 24   GLUCOSE 151* 120* 246* 162* 182*  BUN 21* 22* 23* 23* 21*  CREATININE 0.60* 0.71 0.68 0.61 0.66  CALCIUM 8.4* 8.4* 8.5* 8.4* 8.9  MG  --   --   --   --  1.8  PHOS  --   --   --   --  5.1*   GFR: Estimated Creatinine Clearance: 77.2 mL/min (by C-G formula based on SCr of 0.66 mg/dL). Liver Function Tests: No results for input(s): AST, ALT, ALKPHOS, BILITOT, PROT, ALBUMIN in the last 168 hours. No results for input(s): LIPASE, AMYLASE in the last 168 hours. No results for input(s): AMMONIA in the last 168 hours. Coagulation Profile: No results for input(s): INR, PROTIME in the last 168 hours. Cardiac Enzymes: No results for input(s): CKTOTAL, CKMB, CKMBINDEX, TROPONINI in the last 168 hours. BNP (last 3 results) No results for input(s): PROBNP in the last 8760 hours. HbA1C: No results for input(s): HGBA1C in the last 72 hours. CBG: Recent Labs  Lab 06/02/19 0812 06/02/19 1150 06/02/19 1653 06/02/19 2142 06/03/19 0734  GLUCAP 143* 104* 153* 157* 138*   Lipid Profile: No results for input(s): CHOL, HDL, LDLCALC, TRIG, CHOLHDL, LDLDIRECT in the last 72 hours. Thyroid Function Tests: No results for input(s): TSH, T4TOTAL, FREET4, T3FREE, THYROIDAB in the last 72 hours. Anemia Panel: No results for input(s): VITAMINB12, FOLATE, FERRITIN, TIBC, IRON, RETICCTPCT in the last 72 hours. Urine analysis:    Component Value Date/Time   COLORURINE STRAW (A) 05/13/2019 2028   APPEARANCEUR HAZY (A) 05/13/2019 2028   LABSPEC 1.024 05/13/2019 2028   PHURINE 6.0 05/13/2019 2028   GLUCOSEU >=500 (A) 05/13/2019  2028   HGBUR SMALL (A) 05/13/2019 2028   BILIRUBINUR NEGATIVE 05/13/2019 2028   BILIRUBINUR negative 03/15/2018 1049   BILIRUBINUR negative 12/26/2017 0941   KETONESUR NEGATIVE 05/13/2019 2028   PROTEINUR 30 (A) 05/13/2019 2028   UROBILINOGEN 0.2 03/15/2018 1049   UROBILINOGEN 0.2 12/08/2010 1613   NITRITE POSITIVE (A) 05/13/2019 2028   LEUKOCYTESUR LARGE (A) 05/13/2019 2028     Jahnay Lantier M.D. [facility] 06/03/2019, 11:33 AM  Pager: [phone] Between 7am to 7pm - call Pager - [phone]  After 7pm go to [email or web address] - password [email or web address]  Call night coverage person covering after 7pm

## 2019-06-04 DIAGNOSIS — E1165 Type 2 diabetes mellitus with hyperglycemia: Secondary | ICD-10-CM

## 2019-06-04 LAB — GLUCOSE, CAPILLARY
Glucose-Capillary: 142 mg/dL — ABNORMAL HIGH (ref 70–99)
Glucose-Capillary: 118 mg/dL — ABNORMAL HIGH (ref 70–99)
Glucose-Capillary: 174 mg/dL — ABNORMAL HIGH (ref 70–99)
Glucose-Capillary: 249 mg/dL — ABNORMAL HIGH (ref 70–99)

## 2019-06-04 NOTE — Plan of Care (Signed)
  Problem: Nutrition: Goal: Adequate nutrition will be maintained Outcome: Progressing   Problem: Coping: Goal: Level of anxiety will decrease Outcome: Progressing   

## 2019-06-04 NOTE — Progress Notes (Signed)
Assumed care from off going RN. Condition stable Cont with plan of care. 

## 2019-06-04 NOTE — Progress Notes (Signed)
PROGRESS NOTE    Rodney Barber  WUX:[id number]  DOB: 26-Oct-1965  DOA: 05/13/2019 PCP: [name], NP  Brief Narrative:  53 year old male with history of IDDM, HTN, chronic pancreatitis, smoking with recent prolonged admission in the hospital complicated by suicidal ideation (cleared later), deconditioning, now homeless presented with extreme weakness and leg cramps, EMS found with blood sugars of 500. Patient stated he wasout of insulin for more than a week. ED course:  blood pressures elevated. Potassium < 2. Phos <1. EKG with U waves.  Hospital course:Patient admitted to the hospital with hyperglycemia and severe hypokalemia and suspected UTI. Patient reported diarrhea secondary to taking lactulose at home for high ammonia. HgbA1C 14.3. Insulin resumed, potassium corrected. Urine culture grew MSSA, started on Rocephin. Noted to have left-sided neck abscess, also grew MSSA. Blood cultures, drawn after being on Rocephin, negative so far. ID consulted, recommended 6 weeks of IV cefazolin. Remains in the hospital to finish antibiotics, until 06/25/2019 due to history of IV drug abuse, unsafe to discharge with PICC line. CM assisting with homeless situation.  Subjective:  No acute c/o. Resting comfortable. Aware of ongoing care plan.  Objective: Vitals:   06/03/19 0904 06/03/19 1313 06/03/19 2018 06/04/19 0515  BP: 113/64 109/64 126/78 (!) 142/75  Pulse: 76 79 81 78  Resp: 17 20 16 18   Temp: 98.1 F (36.7 C) 97.7 F (36.5 C) 98 F (36.7 C) 98.3 F (36.8 C)  TempSrc: Oral Oral Oral Oral  SpO2: 100% 100% 99% 98%  Weight:      Height:        Intake/Output Summary (Last 24 hours) at 06/04/2019 0902 Last data filed at 06/04/2019 0600 Gross per 24 hour  Intake 820 ml  Output -  Net 820 ml   Filed Weights   05/14/19 0240 05/20/19 1221 06/02/19 1916  Weight: 46.4 kg 46.4 kg 51.7 kg    Physical Examination:  General exam: Appears calm and comfortable  Respiratory  system: Clear to auscultation. Respiratory effort normal. Cardiovascular system: S1 & S2 heard, RRR. No JVD, murmurs, rubs, gallops or clicks. No pedal edema. Gastrointestinal system: Abdomen is nondistended, soft and nontender. No organomegaly or masses felt. Normal bowel sounds heard. Central nervous system: Alert and oriented. No focal neurological deficits. Extremities: Symmetric 5 x 5 power. Skin: No rashes, lesions or ulcers Psychiatry: Judgement and insight appear normal. Mood & affect appropriate.     Data Reviewed: I have personally reviewed following labs and imaging studies  CBC: Recent Labs  Lab 05/29/19 0513 05/30/19 0512 05/31/19 0518 06/03/19 0522  WBC 6.8 7.0 6.5 6.0  HGB 8.9* 8.7* 8.2* 8.2*  HCT 29.3* 28.5* 27.3* 26.8*  MCV 100.7* 100.4* 100.7* 100.0  PLT 261 256 248 230   Basic Metabolic Panel: Recent Labs  Lab 05/29/19 0513 05/30/19 0512 05/31/19 0518 06/03/19 0522  NA 137 138 138 139  K 4.2 4.3 4.1 4.1  CL 106 108 108 109  CO2 25 24 23 24   GLUCOSE 120* 246* 162* 182*  BUN 22* 23* 23* 21*  CREATININE 0.71 0.68 0.61 0.66  CALCIUM 8.4* 8.5* 8.4* 8.9  MG  --   --   --  1.8  PHOS  --   --   --  5.1*   GFR: Estimated Creatinine Clearance: 77.2 mL/min (by C-G formula based on SCr of 0.66 mg/dL). Liver Function Tests: No results for input(s): AST, ALT, ALKPHOS, BILITOT, PROT, ALBUMIN in the last 168 hours. No results for input(s): LIPASE, AMYLASE  in the last 168 hours. No results for input(s): AMMONIA in the last 168 hours. Coagulation Profile: No results for input(s): INR, PROTIME in the last 168 hours. Cardiac Enzymes: No results for input(s): CKTOTAL, CKMB, CKMBINDEX, TROPONINI in the last 168 hours. BNP (last 3 results) No results for input(s): PROBNP in the last 8760 hours. HbA1C: No results for input(s): HGBA1C in the last 72 hours. CBG: Recent Labs  Lab 06/03/19 0734 06/03/19 1145 06/03/19 1744 06/03/19 2039 06/04/19 0732  GLUCAP  138* 135* 174* 137* 142*   Lipid Profile: No results for input(s): CHOL, HDL, LDLCALC, TRIG, CHOLHDL, LDLDIRECT in the last 72 hours. Thyroid Function Tests: No results for input(s): TSH, T4TOTAL, FREET4, T3FREE, THYROIDAB in the last 72 hours. Anemia Panel: No results for input(s): VITAMINB12, FOLATE, FERRITIN, TIBC, IRON, RETICCTPCT in the last 72 hours. Sepsis Labs: No results for input(s): PROCALCITON, LATICACIDVEN in the last 168 hours.  Recent Results (from the past 240 hour(s))  Culture, blood (Routine X 2) w Reflex to ID Panel     Status: None   Collection Time: 05/28/19  4:28 PM   Specimen: BLOOD  Result Value Ref Range Status   Specimen Description   Final    BLOOD LEFT ANTECUBITAL Performed at [facility], [address] [place], [place] [address]    Special Requests   Final    BOTTLES DRAWN AEROBIC AND ANAEROBIC Blood Culture adequate volume Performed at [facility], [address], [place], [place] [address]    Culture   Final    NO GROWTH 5 DAYS Performed at [facility], [address], [place], [place] [address]    Report Status 06/02/2019 FINAL  Final  Culture, blood (Routine X 2) w Reflex to ID Panel     Status: None   Collection Time: 05/28/19  4:34 PM   Specimen: BLOOD LEFT FOREARM  Result Value Ref Range Status   Specimen Description   Final    BLOOD LEFT FOREARM Performed at [facility], [address], [place], [place] [address]    Special Requests   Final    BOTTLES DRAWN AEROBIC AND ANAEROBIC Blood Culture adequate volume Performed at [facility], [address], [place], [place] [address]    Culture   Final    NO GROWTH 5 DAYS Performed at [facility], [address] [place], [place] [address]    Report Status 06/02/2019 FINAL  Final      Radiology Studies: No results found.      Scheduled Meds: . amLODipine  5 mg Oral Daily  . atorvastatin   20 mg Oral q1800  . escitalopram  5 mg Oral Daily  . feeding supplement (GLUCERNA SHAKE)  237 mL Oral BID BM  . feeding supplement (PRO-STAT SUGAR FREE 64)  30 mL Oral BID  . folic acid  1 mg Oral Daily  . influenza vac split quadrivalent PF  0.5 mL Intramuscular Tomorrow-1000  . insulin aspart  0-5 Units Subcutaneous QHS  . insulin aspart  0-9 Units Subcutaneous TID WC  . insulin aspart  3 Units Subcutaneous TID WC  . insulin glargine  6 Units Subcutaneous BID  . lipase/protease/amylase  12,000 Units Oral TID WC  . lisinopril  10 mg Oral Daily  . magnesium oxide  400 mg Oral BID  . multivitamin with minerals  1 tablet Oral Daily  . nicotine  14 mg Transdermal Daily  . thiamine  100 mg Oral Daily   Continuous Infusions: . sodium chloride 250 mL (05/28/19 1513)  .  ceFAZolin (ANCEF) IV 2 g (06/04/19 0523)    Assessment & Plan:   MSSA UTI, MSSA left neck abscess, C-spine osteomyelitis, MSSA bacteremia-TEE showed no vegetations. ID recommended continuing cefazolin until 11/18, reconsult ID before the discharge date to assess if patient needs continued oral antibiotics  Diabetes mellitus, brittle, uncontrolled with hyperglycemia on presentation:-Hemoglobin A1c in 02/2019 was 17.7, repeat hemoglobin A1c 14.3. CBGs fairly stable now on NovoLog 3 units 3 times daily AC, Lantus 6 units twice daily. SSI   Electrolyte abnormalities including profound hypokalemia, hypophosphatemia--At the time of admission potassium less than 2.0, phosphorus less than 1.0.Electrolytes within normal limits,phos slightly elevated 5.1, K, mag stable  Essential hypertension-BP stable, continue lisinopril, amlodipine  History of depression-Currently stable, no SI, continue Lexapro  History of smoking-Continue nicotine patch, counseled on smoking cessation  Homeless-Case management assisting  Protein calorie malnutrition, BMI 15.57-dietitian consulted and recommendations appreciated   DVT prophylaxis:  SCDs Code Status: DNR Family / Patient Communication: d/w patient Disposition Plan: Inpatient stay until completion of IV abx     LOS: 21 days    Time spent: 15 minutes    Guilford Shi, MD [facility] Pager [phone]  If 7PM-7AM, please contact night-coverage [email or web address] Password [email or web address] 06/04/2019, 9:02 AM

## 2019-06-05 LAB — GLUCOSE, CAPILLARY
Glucose-Capillary: 133 mg/dL — ABNORMAL HIGH (ref 70–99)
Glucose-Capillary: 161 mg/dL — ABNORMAL HIGH (ref 70–99)
Glucose-Capillary: 123 mg/dL — ABNORMAL HIGH (ref 70–99)
Glucose-Capillary: 91 mg/dL (ref 70–99)

## 2019-06-05 LAB — CBC
HCT: 27.9 % — ABNORMAL LOW (ref 39.0–52.0)
Hemoglobin: 8.4 g/dL — ABNORMAL LOW (ref 13.0–17.0)
MCH: 30.4 pg (ref 26.0–34.0)
MCHC: 30.1 g/dL (ref 30.0–36.0)
MCV: 101.1 fL — ABNORMAL HIGH (ref 80.0–100.0)
Platelets: 210 10*3/uL (ref 150–400)
RBC: 2.76 MIL/uL — ABNORMAL LOW (ref 4.22–5.81)
RDW: 14.2 % (ref 11.5–15.5)
WBC: 6 10*3/uL (ref 4.0–10.5)
nRBC: 0 % (ref 0.0–0.2)

## 2019-06-05 LAB — BASIC METABOLIC PANEL
Anion gap: 5 (ref 5–15)
BUN: 23 mg/dL — ABNORMAL HIGH (ref 6–20)
CO2: 23 mmol/L (ref 22–32)
Calcium: 8.7 mg/dL — ABNORMAL LOW (ref 8.9–10.3)
Chloride: 108 mmol/L (ref 98–111)
Creatinine, Ser: 0.65 mg/dL (ref 0.61–1.24)
GFR calc Af Amer: >60 mL/min (ref >60)
GFR calc non Af Amer: >60 mL/min (ref >60)
Glucose, Bld: 155 mg/dL — ABNORMAL HIGH (ref 70–99)
Potassium: 3.8 mmol/L (ref 3.5–5.1)
Sodium: 136 mmol/L (ref 135–145)

## 2019-06-05 MED ORDER — INSULIN GLARGINE 100 UNIT/ML ~~LOC~~ SOLN
8.0000 [IU] | Freq: Two times a day (BID) | SUBCUTANEOUS | Status: DC
  Administered 2019-06-05 – 2019-06-09 (×9): 8 [IU] via SUBCUTANEOUS
  Administered 2019-06-10: 6 [IU] via SUBCUTANEOUS
  Filled 2019-06-05 (×10): qty 0.08

## 2019-06-05 MED ORDER — INSULIN ASPART 100 UNIT/ML ~~LOC~~ SOLN
5.0000 [IU] | Freq: Three times a day (TID) | SUBCUTANEOUS | Status: DC
  Administered 2019-06-05 – 2019-06-08 (×6): 5 [IU] via SUBCUTANEOUS

## 2019-06-05 NOTE — Progress Notes (Signed)
PROGRESS NOTE    Rodney Barber  RWE:[id number] DOB: 1966/07/26 DOA: 05/13/2019 PCP: Gildardo Pounds, NP   Brief Narrative:53 year old male with history ofIDDM,HTN, chronic pancreatitis, smoking withrecent prolonged admission in the hospital complicated bysuicidal ideation (clearedlater), deconditioning, now homeless presentedwith extreme weakness and leg cramps, EMS found with blood sugars of 500. Patient stated he wasout of insulin for more than a week. ED course:blood pressures elevated. Potassium<2. Phos <1.EKG with U waves. Hospital course:Patient admitted to the hospital withhyperglycemia and severe hypokalemia and suspectedUTI. Patientreporteddiarrhea secondary to taking lactulose at home for high ammonia.HgbA1C 14.3.Insulin resumed, potassium corrected. Urine culture grew MSSA,startedon Rocephin. Noted to haveleft-sided neck abscess, also grew MSSA. Blood cultures,drawn after being on Rocephin, negative so far.ID consulted, recommended 6 weeks of IV cefazolin. Remains in the hospital to finish antibiotics, until 11/18/2020due to history of IV drug abuse, unsafe to discharge with PICC line. CM assisting with homeless situation.  Assessment & Plan:   Principal Problem:   Hypokalemia Active Problems:   Uncontrolled type 2 diabetes mellitus (HCC)   UTI (urinary tract infection)   Hyperglycemia   Tobacco use   Abnormal blood electrolyte level   MSSA UTI, MSSA left neck abscess, C-spine osteomyelitis, MSSA bacteremia-TEE showed no vegetations.ID recommended continuing cefazolin until 11/18, reconsult ID before the discharge date to assess if patient needs continued oral antibiotics  Diabetes mellitus, brittle, uncontrolled with hyperglycemia on presentation:-Hemoglobin A1c in 02/2019 was 17.7, repeat hemoglobin A1c 14.3. CBGs last 24 hours CBGs have been 1 33-2 49.  Will increase Lantus to 8 units twice a day and NovoLog to 5 units 3 times a day.    Electrolyte abnormalities including profound hypokalemia, hypophosphatemia--At the time of admission potassium less than 2.0, phosphorus less than 1.0.potassium 3.8 today.  Essential hypertension-blood pressure stable 129/70.  Continue lisinopril and Norvasc.    History of depression-Currently stable, no SI, continue Lexapro  History of smoking-Continue nicotine patch, counseled on smoking cessation  Homeless-Case management assisting  Protein calorie malnutrition, BMI 15.57-dietitian consulted and recommendations appreciated     Nutrition Problem: Severe Malnutrition Etiology: chronic illness     Signs/Symptoms: severe fat depletion, severe muscle depletion    Interventions: MVI, Prostat, Ensure Enlive (each supplement provides 350kcal and 20 grams of protein)  Estimated body mass index is 17.32 kg/m as calculated from the following:   Height as of this encounter: 5\' 8"  (1.727 m).   Weight as of this encounter: 51.7 kg.  DVT prophylaxis: SCD  code Status: DNR  family Communication: None  disposition Plan inpatient stay until completion of IV antibiotics for total of 6 weeks due to history of IV drug abuse.   Consultants:   Infectious disease  Procedures: Left neck aspiration Antimicrobials: Ancef  Subjective: In bed no specific complaints no nausea vomiting or diarrhea swelling in the left upper neck about the same as yesterday.  Objective: Vitals:   06/04/19 1400 06/04/19 2041 06/05/19 0428 06/05/19 1245  BP: 113/66 135/74 138/74 129/70  Pulse: 74 73 73 79  Resp: 16 18 16 18   Temp: 98.3 F (36.8 C) 98 F (36.7 C) 98.2 F (36.8 C) 98.7 F (37.1 C)  TempSrc: Oral Oral Oral Oral  SpO2: 99% 100% 98% 99%  Weight:      Height:        Intake/Output Summary (Last 24 hours) at 06/05/2019 1438 Last data filed at 06/05/2019 1349 Gross per 24 hour  Intake 830 ml  Output -  Net 830 ml   [employer]  05/14/19 0240 05/20/19 1221 06/02/19 1916   Weight: 46.4 kg 46.4 kg 51.7 kg    Examination: Swelling in the left upper nontender General exam: Appears calm and comfortable  Respiratory system: Clear to auscultation. Respiratory effort normal. Cardiovascular system: S1 & S2 heard, RRR. No JVD, murmurs, rubs, gallops or clicks. No pedal edema. Gastrointestinal system: Abdomen is nondistended, soft and nontender. No organomegaly or masses felt. Normal bowel sounds heard. Central nervous system: Alert and oriented. No focal neurological deficits. Extremities: Symmetric 5 x 5 power. Skin: No rashes, lesions or ulcers Psychiatry: Judgement and insight appear normal. Mood & affect appropriate.     Data Reviewed: I have personally reviewed following labs and imaging studies  CBC: Recent Labs  Lab 05/30/19 0512 05/31/19 0518 06/03/19 0522 06/05/19 0453  WBC 7.0 6.5 6.0 6.0  HGB 8.7* 8.2* 8.2* 8.4*  HCT 28.5* 27.3* 26.8* 27.9*  MCV 100.4* 100.7* 100.0 101.1*  PLT 256 248 230 210   Basic Metabolic Panel: Recent Labs  Lab 05/30/19 0512 05/31/19 0518 06/03/19 0522 06/05/19 0453  NA 138 138 139 136  K 4.3 4.1 4.1 3.8  CL 108 108 109 108  CO2 24 23 24 23   GLUCOSE 246* 162* 182* 155*  BUN 23* 23* 21* 23*  CREATININE 0.68 0.61 0.66 0.65  CALCIUM 8.5* 8.4* 8.9 8.7*  MG  --   --  1.8  --   PHOS  --   --  5.1*  --    GFR: Estimated Creatinine Clearance: 77.2 mL/min (by C-G formula based on SCr of 0.65 mg/dL). Liver Function Tests: No results for input(s): AST, ALT, ALKPHOS, BILITOT, PROT, ALBUMIN in the last 168 hours. No results for input(s): LIPASE, AMYLASE in the last 168 hours. No results for input(s): AMMONIA in the last 168 hours. Coagulation Profile: No results for input(s): INR, PROTIME in the last 168 hours. Cardiac Enzymes: No results for input(s): CKTOTAL, CKMB, CKMBINDEX, TROPONINI in the last 168 hours. BNP (last 3 results) No results for input(s): PROBNP in the last 8760 hours. HbA1C: No results for  input(s): HGBA1C in the last 72 hours. CBG: Recent Labs  Lab 06/04/19 1149 06/04/19 1619 06/04/19 2041 06/05/19 0754 06/05/19 1147  GLUCAP 118* 174* 249* 133* 161*   Lipid Profile: No results for input(s): CHOL, HDL, LDLCALC, TRIG, CHOLHDL, LDLDIRECT in the last 72 hours. Thyroid Function Tests: No results for input(s): TSH, T4TOTAL, FREET4, T3FREE, THYROIDAB in the last 72 hours. Anemia Panel: No results for input(s): VITAMINB12, FOLATE, FERRITIN, TIBC, IRON, RETICCTPCT in the last 72 hours. Sepsis Labs: No results for input(s): PROCALCITON, LATICACIDVEN in the last 168 hours.  Recent Results (from the past 240 hour(s))  Culture, blood (Routine X 2) w Reflex to ID Panel     Status: None   Collection Time: 05/28/19  4:28 PM   Specimen: BLOOD  Result Value Ref Range Status   Specimen Description   Final    BLOOD LEFT ANTECUBITAL Performed at [facility], [address], [place], [place] [address]    Special Requests   Final    BOTTLES DRAWN AEROBIC AND ANAEROBIC Blood Culture adequate volume Performed at [facility], [address] [place], [place] [address]    Culture   Final    NO GROWTH 5 DAYS Performed at [facility], [address], [place], [place] [address]    Report Status 06/02/2019 FINAL  Final  Culture, blood (Routine X 2) w Reflex to ID  Panel     Status: None   Collection Time: 05/28/19  4:34 PM   Specimen: BLOOD LEFT FOREARM  Result Value Ref Range Status   Specimen Description   Final    BLOOD LEFT FOREARM Performed at [facility], [address] [place], [place] [address]    Special Requests   Final    BOTTLES DRAWN AEROBIC AND ANAEROBIC Blood Culture adequate volume Performed at [facility], [address], [place], [place] [address]    Culture   Final    NO GROWTH 5 DAYS Performed at [facility], [address], [place], [place] [address]    Report  Status 06/02/2019 FINAL  Final         Radiology Studies: No results found.      Scheduled Meds: . amLODipine  5 mg Oral Daily  . atorvastatin  20 mg Oral q1800  . escitalopram  5 mg Oral Daily  . feeding supplement (GLUCERNA SHAKE)  237 mL Oral BID BM  . feeding supplement (PRO-STAT SUGAR FREE 64)  30 mL Oral BID  . folic acid  1 mg Oral Daily  . influenza vac split quadrivalent PF  0.5 mL Intramuscular Tomorrow-1000  . insulin aspart  0-5 Units Subcutaneous QHS  . insulin aspart  0-9 Units Subcutaneous TID WC  . insulin aspart  3 Units Subcutaneous TID WC  . insulin glargine  6 Units Subcutaneous BID  . lipase/protease/amylase  12,000 Units Oral TID WC  . lisinopril  10 mg Oral Daily  . magnesium oxide  400 mg Oral BID  . multivitamin with minerals  1 tablet Oral Daily  . nicotine  14 mg Transdermal Daily  . thiamine  100 mg Oral Daily   Continuous Infusions: . sodium chloride 250 mL (05/28/19 1513)  .  ceFAZolin (ANCEF) IV 2 g (06/05/19 1346)     LOS: 22 days     Alwyn Ren, MD [facility]  If 7PM-7AM, please contact night-coverage [email or web address] Password [email or web address] 06/05/2019, 2:38 PM

## 2019-06-05 NOTE — Progress Notes (Signed)
Occupational Therapy Treatment Patient Details Name: Rodney Barber MRN: [id number] DOB: 10-30-1965 Today's Date: 06/05/2019    History of present illness 53 year old male with history of insulin-dependent diabetes, hypertension, chronic pancreatitis, smoker, recent prolonged admission in the hospital complicated with initial suicidal ideation later cleared, deconditioning, now homeless presents to the emergency room with extreme weakness and leg cramps, EMS found with blood sugars of 500,severe hypokalemia, hypophosphatemia, and MSSA UTI , MSSA left neck abscess and C-spine osteomyelitis   OT comments  Pt presents supine in bed agreeable to OT session. Pt participating in bil UE strengthening HEP using level 1 theraband as precursor to completing ADL and functional transfers. Pt return demonstrating with min cues for technique and corresponding handout issued. Will continue per POC at this time.   Follow Up Recommendations  No OT follow up    Equipment Recommendations  None recommended by OT          Precautions / Restrictions Precautions Precautions: Fall Restrictions Weight Bearing Restrictions: No              ADL either performed or assessed with clinical judgement   ADL Overall ADL's : Needs assistance/impaired                                       General ADL Comments: pt reports having completed necessary ADL earlier today; agreeable to UB exercise using theraband, reviewed HEP and issued corresponding handout                       Cognition Arousal/Alertness: Awake/alert Behavior During Therapy: WFL for tasks assessed/performed Overall Cognitive Status: Within Functional Limits for tasks assessed                                          Exercises Exercises: General Upper Extremity General Exercises - Upper Extremity Shoulder Flexion: AROM;Both;Theraband Theraband Level (Shoulder Flexion): Level 1 (Yellow) Shoulder  ABduction: AROM;Both;10 reps;Theraband Theraband Level (Shoulder Abduction): Level 1 (Yellow) Shoulder ADduction: AROM;Both;10 reps;Theraband Theraband Level (Shoulder Adduction): Level 1 (Yellow) Shoulder Horizontal ABduction: AROM;Both;10 reps;Theraband Theraband Level (Shoulder Horizontal Abduction): Level 1 (Yellow) Shoulder Horizontal ADduction: AROM;Both;10 reps;Theraband Theraband Level (Shoulder Horizontal Adduction): Level 1 (Yellow) Elbow Flexion: AROM;Both;10 reps;Theraband Theraband Level (Elbow Flexion): Level 1 (Yellow) Elbow Extension: AROM;Both;10 reps;Theraband Theraband Level (Elbow Extension): Level 1 (Yellow)   Shoulder Instructions       General Comments      Pertinent Vitals/ Pain       Pain Assessment: No/denies pain  Home Living                                          Prior Functioning/Environment              Frequency  Min 2X/week        Progress Toward Goals  OT Goals(current goals can now be found in the care plan section)  Progress towards OT goals: Progressing toward goals  Acute Rehab OT Goals Patient Stated Goal: move better OT Goal Formulation: With patient Time For Goal Achievement: 06/19/19 Potential to Achieve Goals: Good  Plan Discharge plan remains appropriate    Co-evaluation  AM-PAC OT "6 Clicks" Daily Activity     Outcome Measure   Help from another person eating meals?: None Help from another person taking care of personal grooming?: None Help from another person toileting, which includes using toliet, bedpan, or urinal?: A Little Help from another person bathing (including washing, rinsing, drying)?: A Little Help from another person to put on and taking off regular upper body clothing?: None Help from another person to put on and taking off regular lower body clothing?: A Little 6 Click Score: 21    End of Session    OT Visit Diagnosis: Unsteadiness on feet  (R26.81)   Activity Tolerance Patient tolerated treatment well   Patient Left in bed;with call bell/phone within reach   Nurse Communication Mobility status        Time: 5176-1607 OT Time Calculation (min): 20 min  Charges: OT General Charges $OT Visit: 1 Visit OT Treatments $Therapeutic Activity: 8-22 mins  Marcy Siren, OT [facility] Pager [phone] Office [phone]    Orlando Penner 06/05/2019, 3:26 PM

## 2019-06-05 NOTE — Progress Notes (Signed)
Assumed care of patient at this time. Patient is stable with no complaints at this time. Agree with previously documented assessment. Will continue to monitor patient.   

## 2019-06-06 LAB — GLUCOSE, CAPILLARY
Glucose-Capillary: 125 mg/dL — ABNORMAL HIGH (ref 70–99)
Glucose-Capillary: 84 mg/dL (ref 70–99)
Glucose-Capillary: 224 mg/dL — ABNORMAL HIGH (ref 70–99)
Glucose-Capillary: 126 mg/dL — ABNORMAL HIGH (ref 70–99)

## 2019-06-06 NOTE — Progress Notes (Signed)
PROGRESS NOTE    Rodney Barber  JQZ:[id number] DOB: 09-17-1965 DOA: 05/13/2019 PCP: [name], NP   Brief Narrative: 53 year old male with history ofIDDM,HTN, chronic pancreatitis, smoking withrecent prolonged admission in the hospital complicated bysuicidal ideation (clearedlater), deconditioning, now homeless presentedwith extreme weakness and leg cramps, EMS found with blood sugars of 500. Patient stated he wasout of insulin for more than a week. ED course:blood pressures elevated. Potassium<2. Phos <1.EKG with U waves. Hospital course:Patient admitted to the hospital withhyperglycemia and severe hypokalemia and suspectedUTI. Patientreporteddiarrhea secondary to taking lactulose at home for high ammonia.HgbA1C 14.3.Insulin resumed, potassium corrected. Urine culture grew MSSA,startedon Rocephin. Noted to haveleft-sided neck abscess, also grew MSSA. Blood cultures,drawn after being on Rocephin, negative so far.ID consulted, recommended 6 weeks of IV cefazolin. Remains in the hospital to finish antibiotics, until 11/18/2020due to history of IV drug abuse, unsafe to discharge with PICC line. CM assisting with homeless situation.   Assessment & Plan:   Principal Problem:   Hypokalemia Active Problems:   Uncontrolled type 2 diabetes mellitus (HCC)   UTI (urinary tract infection)   Hyperglycemia   Tobacco use   Abnormal blood electrolyte level    MSSA UTI, MSSA left neck abscess, C-spine osteomyelitis, MSSA bacteremia-TEE showed no vegetations.ID recommended continuing cefazolin until 11/18, reconsultIDbefore the discharge date to assess if patient needs continued oral antibiotics  Diabetes mellitus, brittle, uncontrolledwith hyperglycemiaon presentation:-Hemoglobin A1c in 02/2019 was 17.7, repeat hemoglobin A1c 14.3.CBGs last 24 hours CBGs have been 91to 161. Better after increasing    Lantus to 8 units twice a day and NovoLog to 5 units 3 times a  day.   Electrolyte abnormalities including profound hypokalemia, hypophosphatemia--At the time of admission potassium less than 2.0, phosphorus less than 1.0.potassium 3.8 yest.  Essential hypertension-blood pressure stable 124/84 continue Norvasc and lisinopril.   History of depression-Currently stable, no SI, continue Lexapro  History of smoking-Continue nicotine patch, counseled on smoking cessation  Homeless-Case management assisting  Protein calorie malnutrition, BMI 15.57-dietitian consultedand recommendations appreciated       Nutrition Problem: Severe Malnutrition Etiology: chronic illness     Signs/Symptoms: severe fat depletion, severe muscle depletion    Interventions: MVI, Prostat, Ensure Enlive (each supplement provides 350kcal and 20 grams of protein)  Estimated body mass index is 17.32 kg/m as calculated from the following:   Height as of this encounter: 5\' 8"  (1.727 m).   Weight as of this encounter: 51.7 kg. DVT prophylaxis: SCD  code Status: DNR  family Communication: None  disposition Plan inpatient stay until completion of IV antibiotics for total of 6 weeks due to history of IV drug abuse.   Consultants:   Infectious disease  Procedures: Left neck aspiration Antimicrobials: Ancef    Subjective: No complaints  Objective: Vitals:   06/05/19 1245 06/05/19 2016 06/06/19 0555 06/06/19 1248  BP: 129/70 120/73 127/81 124/84  Pulse: 79 74 67 73  Resp: 18 16 16 16   Temp: 98.7 F (37.1 C) 98.1 F (36.7 C) 97.8 F (36.6 C) 97.6 F (36.4 C)  TempSrc: Oral Oral Oral Oral  SpO2: 99% 100% 100% 100%  Weight:      Height:        Intake/Output Summary (Last 24 hours) at 06/06/2019 1311 Last data filed at 06/05/2019 1349 Gross per 24 hour  Intake 240 ml  Output -  Net 240 ml   Filed Weights   05/14/19 0240 05/20/19 1221 06/02/19 1916  Weight: 46.4 kg 46.4 kg 51.7 kg    Examination:  Left upper neck area soft swollen  unchanged  General exam: Appears calm and comfortable  Respiratory system: Clear to auscultation. Respiratory effort normal. Cardiovascular system: S1 & S2 heard, RRR. No JVD, murmurs, rubs, gallops or clicks. No pedal edema. Gastrointestinal system: Abdomen is nondistended, soft and nontender. No organomegaly or masses felt. Normal bowel sounds heard. Central nervous system: Alert and oriented. No focal neurological deficits. Extremities: Symmetric 5 x 5 power. Skin: No rashes, lesions or ulcers Psychiatry: Judgement and insight appear normal. Mood & affect appropriate.     Data Reviewed: I have personally reviewed following labs and imaging studies  CBC: Recent Labs  Lab 05/31/19 0518 06/03/19 0522 06/05/19 0453  WBC 6.5 6.0 6.0  HGB 8.2* 8.2* 8.4*  HCT 27.3* 26.8* 27.9*  MCV 100.7* 100.0 101.1*  PLT 248 230 706   Basic Metabolic Panel: Recent Labs  Lab 05/31/19 0518 06/03/19 0522 06/05/19 0453  NA 138 139 136  K 4.1 4.1 3.8  CL 108 109 108  CO2 23 24 23   GLUCOSE 162* 182* 155*  BUN 23* 21* 23*  CREATININE 0.61 0.66 0.65  CALCIUM 8.4* 8.9 8.7*  MG  --  1.8  --   PHOS  --  5.1*  --    GFR: Estimated Creatinine Clearance: 77.2 mL/min (by C-G formula based on SCr of 0.65 mg/dL). Liver Function Tests: No results for input(s): AST, ALT, ALKPHOS, BILITOT, PROT, ALBUMIN in the last 168 hours. No results for input(s): LIPASE, AMYLASE in the last 168 hours. No results for input(s): AMMONIA in the last 168 hours. Coagulation Profile: No results for input(s): INR, PROTIME in the last 168 hours. Cardiac Enzymes: No results for input(s): CKTOTAL, CKMB, CKMBINDEX, TROPONINI in the last 168 hours. BNP (last 3 results) No results for input(s): PROBNP in the last 8760 hours. HbA1C: No results for input(s): HGBA1C in the last 72 hours. CBG: Recent Labs  Lab 06/05/19 1147 06/05/19 1651 06/05/19 2041 06/06/19 0733 06/06/19 1152  GLUCAP 161* 123* 91 125* 84   Lipid  Profile: No results for input(s): CHOL, HDL, LDLCALC, TRIG, CHOLHDL, LDLDIRECT in the last 72 hours. Thyroid Function Tests: No results for input(s): TSH, T4TOTAL, FREET4, T3FREE, THYROIDAB in the last 72 hours. Anemia Panel: No results for input(s): VITAMINB12, FOLATE, FERRITIN, TIBC, IRON, RETICCTPCT in the last 72 hours. Sepsis Labs: No results for input(s): PROCALCITON, LATICACIDVEN in the last 168 hours.  Recent Results (from the past 240 hour(s))  Culture, blood (Routine X 2) w Reflex to ID Panel     Status: None   Collection Time: 05/28/19  4:28 PM   Specimen: BLOOD  Result Value Ref Range Status   Specimen Description   Final    BLOOD LEFT ANTECUBITAL Performed at [facility] [address] [place], [place] [address]    Special Requests   Final    BOTTLES DRAWN AEROBIC AND ANAEROBIC Blood Culture adequate volume Performed at [facility] [address], [place], [place] [address]    Culture   Final    NO GROWTH 5 DAYS Performed at [facility], [place] [address] [place], [place] [address]    Report Status 06/02/2019 FINAL  Final  Culture, blood (Routine X 2) w Reflex to ID Panel     Status: None   Collection Time: 05/28/19  4:34 PM   Specimen: BLOOD LEFT FOREARM  Result Value Ref Range Status   Specimen Description   Final    BLOOD LEFT  FOREARM Performed at [facility], [address], [place], [place] [address]    Special Requests   Final    BOTTLES DRAWN AEROBIC AND ANAEROBIC Blood Culture adequate volume Performed at [facility], [address] [place], [place] [address]    Culture   Final    NO GROWTH 5 DAYS Performed at [facility], [address], [place], [place] [address]    Report Status 06/02/2019 FINAL  Final         Radiology Studies: No results found.      Scheduled Meds: . amLODipine  5 mg Oral Daily  . atorvastatin  20 mg Oral q1800  .  escitalopram  5 mg Oral Daily  . feeding supplement (GLUCERNA SHAKE)  237 mL Oral BID BM  . feeding supplement (PRO-STAT SUGAR FREE 64)  30 mL Oral BID  . folic acid  1 mg Oral Daily  . influenza vac split quadrivalent PF  0.5 mL Intramuscular Tomorrow-1000  . insulin aspart  0-5 Units Subcutaneous QHS  . insulin aspart  0-9 Units Subcutaneous TID WC  . insulin aspart  5 Units Subcutaneous TID WC  . insulin glargine  8 Units Subcutaneous BID  . lipase/protease/amylase  12,000 Units Oral TID WC  . lisinopril  10 mg Oral Daily  . magnesium oxide  400 mg Oral BID  . multivitamin with minerals  1 tablet Oral Daily  . nicotine  14 mg Transdermal Daily  . thiamine  100 mg Oral Daily   Continuous Infusions: . sodium chloride 250 mL (05/28/19 1513)  .  ceFAZolin (ANCEF) IV 2 g (06/06/19 0558)     LOS: 23 days     Alwyn Ren, MD [facility]  If 7PM-7AM, please contact night-coverage [email or web address] Password [email or web address] 06/06/2019, 1:11 PM

## 2019-06-07 LAB — CBC
HCT: 28.3 % — ABNORMAL LOW (ref 39.0–52.0)
Hemoglobin: 8.3 g/dL — ABNORMAL LOW (ref 13.0–17.0)
MCH: 30.3 pg (ref 26.0–34.0)
MCHC: 29.3 g/dL — ABNORMAL LOW (ref 30.0–36.0)
MCV: 103.3 fL — ABNORMAL HIGH (ref 80.0–100.0)
Platelets: 193 10*3/uL (ref 150–400)
RBC: 2.74 MIL/uL — ABNORMAL LOW (ref 4.22–5.81)
RDW: 14.1 % (ref 11.5–15.5)
WBC: 5.4 10*3/uL (ref 4.0–10.5)
nRBC: 0 % (ref 0.0–0.2)

## 2019-06-07 LAB — BASIC METABOLIC PANEL
Anion gap: 7 (ref 5–15)
BUN: 24 mg/dL — ABNORMAL HIGH (ref 6–20)
CO2: 24 mmol/L (ref 22–32)
Calcium: 8.6 mg/dL — ABNORMAL LOW (ref 8.9–10.3)
Chloride: 108 mmol/L (ref 98–111)
Creatinine, Ser: 0.66 mg/dL (ref 0.61–1.24)
GFR calc Af Amer: >60 mL/min (ref >60)
GFR calc non Af Amer: >60 mL/min (ref >60)
Glucose, Bld: 115 mg/dL — ABNORMAL HIGH (ref 70–99)
Potassium: 4.5 mmol/L (ref 3.5–5.1)
Sodium: 139 mmol/L (ref 135–145)

## 2019-06-07 LAB — GLUCOSE, CAPILLARY
Glucose-Capillary: 96 mg/dL (ref 70–99)
Glucose-Capillary: 268 mg/dL — ABNORMAL HIGH (ref 70–99)
Glucose-Capillary: 81 mg/dL (ref 70–99)
Glucose-Capillary: 178 mg/dL — ABNORMAL HIGH (ref 70–99)

## 2019-06-07 NOTE — Progress Notes (Signed)
PROGRESS NOTE    Rodney Barber  EXH:[id number] DOB: [birth year] DOA: 05/13/2019 PCP: [name], NP    Brief Narrative:53 year old male with history ofIDDM,HTN, chronic pancreatitis, smoking withrecent prolonged admission in the hospital complicated bysuicidal ideation (clearedlater), deconditioning, now homeless presentedwith extreme weakness and leg cramps, EMS found with blood sugars of 500. Patient stated he wasout of insulin for more than a week. ED course:blood pressures elevated. Potassium<2. Phos <1.EKG with U waves. Hospital course:Patient admitted to the hospital withhyperglycemia and severe hypokalemia and suspectedUTI. Patientreporteddiarrhea secondary to taking lactulose at home for high ammonia.HgbA1C 14.3.Insulin resumed, potassium corrected. Urine culture grew MSSA,startedon Rocephin. Noted to haveleft-sided neck abscess, also grew MSSA. Blood cultures,drawn after being on Rocephin, negative so far.ID consulted, recommended 6 weeks of IV cefazolin. Remains in the hospital to finish antibiotics, until 11/18/2020due to history of IV drug abuse, unsafe to discharge with PICC line. CM assisting with homeless situation  Assessment & Plan:   Principal Problem:   Hypokalemia Active Problems:   Uncontrolled type 2 diabetes mellitus (HCC)   UTI (urinary tract infection)   Hyperglycemia   Tobacco use   Abnormal blood electrolyte level   MSSA UTI, MSSA left neck abscess, C-spine osteomyelitis, MSSA bacteremia-TEE showed no vegetations.ID recommended continuing cefazolin until 11/18, reconsultIDbefore the discharge date to assess if patient needs continued oral antibiotics  Diabetes mellitus, brittle, uncontrolledwith hyperglycemiaon presentation:-Hemoglobin A1c in 02/2019 was 17.7, repeat hemoglobin A1c 14.3.CBGs last 24 hours CBGs have been 84 to 224.lantus was increased 2 days ago to 8 units bid. Continue  Lantus to 8 units twice a day and  NovoLog to 5 units 3 times a day.   Electrolyte abnormalities including profound hypokalemia, hypophosphatemia--At the time of admission potassium less than 2.0, phosphorus less than 1.0.potassium 3.8 today.  Essential hypertension-blood pressure stable 131/77 continue norvasc and lisinopril   History of depression-Currently stable, no SI, continue Lexapro  History of smoking-Continue nicotine patch, counseled on smoking cessation  Homeless-Case management assisting  Protein calorie malnutrition, BMI 15.57-dietitian consultedand recommendations appreciated  Chronic anemia stable .MCV high last 2 days?check b 12 and folate   Nutrition Problem: Severe Malnutrition Etiology: chronic illness     Signs/Symptoms: severe fat depletion, severe muscle depletion    Interventions: MVI, Prostat, Ensure Enlive (each supplement provides 350kcal and 20 grams of protein)  Estimated body mass index is 17.32 kg/m as calculated from the following:   Height as of this encounter: 5\' 8"  (1.727 m).   Weight as of this encounter: 51.7 kg.   DVT prophylaxis: SCD  code Status: DNR  family Communication: None  disposition Plan inpatient stay until completion of IV antibiotics for total of 6 weeks due to history of IV drug abuse last dose 11/18   Consultants:   Infectious disease  Procedures: Left neck aspiration Antimicrobials: Ancef   Subjective:  Ambulating in the room no new complaints no diarrhea Objective: Vitals:   06/06/19 0555 06/06/19 1248 06/06/19 2020 06/07/19 0536  BP: 127/81 124/84 129/79 131/77  Pulse: 67 73 78 76  Resp: 16 16 17 16   Temp: 97.8 F (36.6 C) 97.6 F (36.4 C) 98.7 F (37.1 C) 98.4 F (36.9 C)  TempSrc: Oral Oral Oral Oral  SpO2: 100% 100% 97% 98%  Weight:      Height:        Intake/Output Summary (Last 24 hours) at 06/07/2019 0947 Last data filed at 06/06/2019 1900 Gross per 24 hour  Intake 640 ml  Output -  Net 640 ml  Filed  Weights   05/14/19 0240 05/20/19 1221 06/02/19 1916  Weight: 46.4 kg 46.4 kg 51.7 kg    Examination: Left upper neck area swollen non tender  General exam: Appears calm and comfortable  Respiratory system: Clear to auscultation. Respiratory effort normal. Cardiovascular system: S1 & S2 heard, RRR. No JVD, murmurs, rubs, gallops or clicks. No pedal edema. Gastrointestinal system: Abdomen is nondistended, soft and nontender. No organomegaly or masses felt. Normal bowel sounds heard. Central nervous system: Alert and oriented. No focal neurological deficits. Extremities: Symmetric 5 x 5 power. Skin: No rashes, lesions or ulcers Psychiatry: Judgement and insight appear normal. Mood & affect appropriate.     Data Reviewed: I have personally reviewed following labs and imaging studies  CBC: Recent Labs  Lab 06/03/19 0522 06/05/19 0453 06/07/19 0506  WBC 6.0 6.0 5.4  HGB 8.2* 8.4* 8.3*  HCT 26.8* 27.9* 28.3*  MCV 100.0 101.1* 103.3*  PLT 230 210 193   Basic Metabolic Panel: Recent Labs  Lab 06/03/19 0522 06/05/19 0453 06/07/19 0506  NA 139 136 139  K 4.1 3.8 4.5  CL 109 108 108  CO2 24 23 24   GLUCOSE 182* 155* 115*  BUN 21* 23* 24*  CREATININE 0.66 0.65 0.66  CALCIUM 8.9 8.7* 8.6*  MG 1.8  --   --   PHOS 5.1*  --   --    GFR: Estimated Creatinine Clearance: 77.2 mL/min (by C-G formula based on SCr of 0.66 mg/dL). Liver Function Tests: No results for input(s): AST, ALT, ALKPHOS, BILITOT, PROT, ALBUMIN in the last 168 hours. No results for input(s): LIPASE, AMYLASE in the last 168 hours. No results for input(s): AMMONIA in the last 168 hours. Coagulation Profile: No results for input(s): INR, PROTIME in the last 168 hours. Cardiac Enzymes: No results for input(s): CKTOTAL, CKMB, CKMBINDEX, TROPONINI in the last 168 hours. BNP (last 3 results) No results for input(s): PROBNP in the last 8760 hours. HbA1C: No results for input(s): HGBA1C in the last 72 hours. CBG:  Recent Labs  Lab 06/06/19 0733 06/06/19 1152 06/06/19 1659 06/06/19 2119 06/07/19 0728  GLUCAP 125* 84 224* 126* 96   Lipid Profile: No results for input(s): CHOL, HDL, LDLCALC, TRIG, CHOLHDL, LDLDIRECT in the last 72 hours. Thyroid Function Tests: No results for input(s): TSH, T4TOTAL, FREET4, T3FREE, THYROIDAB in the last 72 hours. Anemia Panel: No results for input(s): VITAMINB12, FOLATE, FERRITIN, TIBC, IRON, RETICCTPCT in the last 72 hours. Sepsis Labs: No results for input(s): PROCALCITON, LATICACIDVEN in the last 168 hours.  Recent Results (from the past 240 hour(s))  Culture, blood (Routine X 2) w Reflex to ID Panel     Status: None   Collection Time: 05/28/19  4:28 PM   Specimen: BLOOD  Result Value Ref Range Status   Specimen Description   Final    BLOOD LEFT ANTECUBITAL Performed at [facility], [address] [place], [place] [address]    Special Requests   Final    BOTTLES DRAWN AEROBIC AND ANAEROBIC Blood Culture adequate volume Performed at [facility], [address] [place], [place] [address]    Culture   Final    NO GROWTH 5 DAYS Performed at [facility], [address] [place], [place] [address]    Report Status 06/02/2019 FINAL  Final  Culture, blood (Routine X 2) w Reflex to ID Panel     Status: None   Collection Time: 05/28/19  4:34 PM  Specimen: BLOOD LEFT FOREARM  Result Value Ref Range Status   Specimen Description   Final    BLOOD LEFT FOREARM Performed at [facility] [address] [place], [place] [address]    Special Requests   Final    BOTTLES DRAWN AEROBIC AND ANAEROBIC Blood Culture adequate volume Performed at [facility] [address] [place], [place] [address]    Culture   Final    NO GROWTH 5 DAYS Performed at [facility], [place] [address], [place], [place] [address]    Report Status 06/02/2019 FINAL  Final          Radiology Studies: No results found.      Scheduled Meds: . amLODipine  5 mg Oral Daily  . atorvastatin  20 mg Oral q1800  . escitalopram  5 mg Oral Daily  . feeding supplement (GLUCERNA SHAKE)  237 mL Oral BID BM  . feeding supplement (PRO-STAT SUGAR FREE 64)  30 mL Oral BID  . folic acid  1 mg Oral Daily  . influenza vac split quadrivalent PF  0.5 mL Intramuscular Tomorrow-1000  . insulin aspart  0-5 Units Subcutaneous QHS  . insulin aspart  0-9 Units Subcutaneous TID WC  . insulin aspart  5 Units Subcutaneous TID WC  . insulin glargine  8 Units Subcutaneous BID  . lipase/protease/amylase  12,000 Units Oral TID WC  . lisinopril  10 mg Oral Daily  . magnesium oxide  400 mg Oral BID  . multivitamin with minerals  1 tablet Oral Daily  . nicotine  14 mg Transdermal Daily  . thiamine  100 mg Oral Daily   Continuous Infusions: . sodium chloride 250 mL (05/28/19 1513)  .  ceFAZolin (ANCEF) IV 2 g (06/07/19 0548)     LOS: 24 days     Georgette Shell, MD [facility]  If 7PM-7AM, please contact night-coverage [email or web address] Password [email or web address] 06/07/2019, 9:47 AM

## 2019-06-08 LAB — GLUCOSE, CAPILLARY
Glucose-Capillary: 85 mg/dL (ref 70–99)
Glucose-Capillary: 149 mg/dL — ABNORMAL HIGH (ref 70–99)
Glucose-Capillary: 172 mg/dL — ABNORMAL HIGH (ref 70–99)
Glucose-Capillary: 127 mg/dL — ABNORMAL HIGH (ref 70–99)

## 2019-06-08 LAB — FOLATE: Folate: 21.5 ng/mL (ref >5.9)

## 2019-06-08 LAB — VITAMIN B12: Vitamin B-12: 422 pg/mL (ref 180–914)

## 2019-06-08 NOTE — Progress Notes (Addendum)
PROGRESS NOTE    Rodney Barber  QJJ:[id number] DOB: [birth year] DOA: 05/13/2019 PCP: Gildardo Pounds, NP    Brief Narrative: 53 year old male with history ofIDDM,HTN, chronic pancreatitis, smoking withrecent prolonged admission in the hospital complicated bysuicidal ideation (clearedlater), deconditioning, now homeless presentedwith extreme weakness and leg cramps, EMS found with blood sugars of 500. Patient stated he wasout of insulin for more than a week. ED course:blood pressures elevated. Potassium<2. Phos <1.EKG with U waves. Hospital course:Patient admitted to the hospital withhyperglycemia and severe hypokalemia and suspectedUTI. Patientreporteddiarrhea secondary to taking lactulose at home for high ammonia.HgbA1C 14.3.Insulin resumed, potassium corrected. Urine culture grew MSSA,startedon Rocephin. Noted to haveleft-sided neck abscess, also grew MSSA. Blood cultures,drawn after being on Rocephin, negative so far.ID consulted, recommended 6 weeks of IV cefazolin. Remains in the hospital to finish antibiotics, until 11/18/2020due to history of IV drug abuse, unsafe to discharge with PICC line. CM assisting with homeless situation  Assessment & Plan:   Principal Problem:   Hypokalemia Active Problems:   Uncontrolled type 2 diabetes mellitus (HCC)   UTI (urinary tract infection)   Hyperglycemia   Tobacco use   Abnormal blood electrolyte level    MSSA UTI, MSSA left neck abscess, C-spine osteomyelitis, MSSA bacteremia-TEE showed no vegetations.ID recommended continuing cefazolin until 11/18, reconsultIDbefore the discharge date to assess if patient needs continued oral antibiotics  Diabetes mellitus, brittle, uncontrolledwith hyperglycemiaon presentation:-Hemoglobin A1c in 02/2019 was 17.7, repeat hemoglobin A1c 14.3.CBGslast 24 hours CBGs have been 8 to 268.lantus was increased 3 days ago to 8 units bid. Continue  Lantus to 8 units twice a day and  NovoLog to 5 units 3 times a day.   Electrolyte abnormalities including profound hypokalemia, hypophosphatemia--At the time of admission potassium less than 2.0, phosphorus less than 1.0.potassium4.5 and has been stable.  Phosphorus 5.1.  Recheck tomorrow.  Essential hypertension-blood pressure stable 140/76 continue norvasc and lisinopril  History of depression-Currently stable, no SI, continue Lexapro  History of smoking-Continue nicotine patch, counseled on smoking cessation  Homeless-Case management assisting  Protein calorie malnutrition, BMI 15.57-dietitian consultedand recommendations appreciated  Chronic anemia stable .MCV high.  B12 and folate normal.      Nutrition Problem: Severe Malnutrition Etiology: chronic illness     Signs/Symptoms: severe fat depletion, severe muscle depletion    Interventions: MVI, Prostat, Ensure Enlive (each supplement provides 350kcal and 20 grams of protein)  Estimated body mass index is 17.32 kg/m as calculated from the following:   Height as of this encounter: 5\' 8"  (1.727 m).   Weight as of this encounter: 51.7 kg.  DVT prophylaxis:SCD  code Status:DNR  family Communication:None  disposition Planinpatient stay until completion of IV antibioticsfor total of 6 weeks due to history of IV drug abuse last dose 11/18   Consultants:  Infectious disease  Procedures:Left neck aspiration Antimicrobials:Ancef   Subjective:  No complaints staff reports no issues overnight Objective: Vitals:   06/07/19 0536 06/07/19 1318 06/07/19 2146 06/08/19 0439  BP: 131/77 128/73 (!) 142/75 140/76  Pulse: 76 83 76 78  Resp: 16 18 18 18   Temp: 98.4 F (36.9 C) 98.4 F (36.9 C) 98.6 F (37 C) 98 F (36.7 C)  TempSrc: Oral Oral Oral Oral  SpO2: 98% 97% 95% 97%  Weight:      Height:        Intake/Output Summary (Last 24 hours) at 06/08/2019 1044 Last data filed at 06/07/2019 1713 Gross per 24 hour  Intake 511.18  ml  Output --  Net 511.18 ml  Filed Weights   05/14/19 0240 05/20/19 1221 06/02/19 1916  Weight: 46.4 kg 46.4 kg 51.7 kg    Examination: Swelling in the left upper neck below the left ear stable nontender General exam: Appears calm and comfortable  Respiratory system: Clear to auscultation. Respiratory effort normal. Cardiovascular system: S1 & S2 heard, RRR. No JVD, murmurs, rubs, gallops or clicks. No pedal edema. Gastrointestinal system: Abdomen is nondistended, soft and nontender. No organomegaly or masses felt. Normal bowel sounds heard. Central nervous system: Alert and oriented. No focal neurological deficits. Extremities: Symmetric 5 x 5 power. Skin: No rashes, lesions or ulcers Psychiatry: Judgement and insight appear normal. Mood & affect appropriate.     Data Reviewed: I have personally reviewed following labs and imaging studies  CBC: Recent Labs  Lab 06/03/19 0522 06/05/19 0453 06/07/19 0506  WBC 6.0 6.0 5.4  HGB 8.2* 8.4* 8.3*  HCT 26.8* 27.9* 28.3*  MCV 100.0 101.1* 103.3*  PLT 230 210 193   Basic Metabolic Panel: Recent Labs  Lab 06/03/19 0522 06/05/19 0453 06/07/19 0506  NA 139 136 139  K 4.1 3.8 4.5  CL 109 108 108  CO2 24 23 24   GLUCOSE 182* 155* 115*  BUN 21* 23* 24*  CREATININE 0.66 0.65 0.66  CALCIUM 8.9 8.7* 8.6*  MG 1.8  --   --   PHOS 5.1*  --   --    GFR: Estimated Creatinine Clearance: 77.2 mL/min (by C-G formula based on SCr of 0.66 mg/dL). Liver Function Tests: No results for input(s): AST, ALT, ALKPHOS, BILITOT, PROT, ALBUMIN in the last 168 hours. No results for input(s): LIPASE, AMYLASE in the last 168 hours. No results for input(s): AMMONIA in the last 168 hours. Coagulation Profile: No results for input(s): INR, PROTIME in the last 168 hours. Cardiac Enzymes: No results for input(s): CKTOTAL, CKMB, CKMBINDEX, TROPONINI in the last 168 hours. BNP (last 3 results) No results for input(s): PROBNP in the last 8760  hours. HbA1C: No results for input(s): HGBA1C in the last 72 hours. CBG: Recent Labs  Lab 06/07/19 0728 06/07/19 1141 06/07/19 1639 06/07/19 2025 06/08/19 0729  GLUCAP 96 268* 81 178* 85   Lipid Profile: No results for input(s): CHOL, HDL, LDLCALC, TRIG, CHOLHDL, LDLDIRECT in the last 72 hours. Thyroid Function Tests: No results for input(s): TSH, T4TOTAL, FREET4, T3FREE, THYROIDAB in the last 72 hours. Anemia Panel: Recent Labs    06/08/19 0515  VITAMINB12 422  FOLATE 21.5   Sepsis Labs: No results for input(s): PROCALCITON, LATICACIDVEN in the last 168 hours.  No results found for this or any previous visit (from the past 240 hour(s)).       Radiology Studies: No results found.      Scheduled Meds:  amLODipine  5 mg Oral Daily   atorvastatin  20 mg Oral q1800   escitalopram  5 mg Oral Daily   feeding supplement (GLUCERNA SHAKE)  237 mL Oral BID BM   feeding supplement (PRO-STAT SUGAR FREE 64)  30 mL Oral BID   folic acid  1 mg Oral Daily   influenza vac split quadrivalent PF  0.5 mL Intramuscular Tomorrow-1000   insulin aspart  0-5 Units Subcutaneous QHS   insulin aspart  0-9 Units Subcutaneous TID WC   insulin aspart  5 Units Subcutaneous TID WC   insulin glargine  8 Units Subcutaneous BID   lipase/protease/amylase  12,000 Units Oral TID WC   lisinopril  10 mg Oral Daily   magnesium oxide  400 mg Oral BID   multivitamin with minerals  1 tablet Oral Daily   nicotine  14 mg Transdermal Daily   thiamine  100 mg Oral Daily   Continuous Infusions:  sodium chloride 250 mL (06/07/19 1421)    ceFAZolin (ANCEF) IV 2 g (06/08/19 0603)     LOS: 25 days     Alwyn Ren, MD [facility]  If 7PM-7AM, please contact night-coverage [email or web address] Password [email or web address] 06/08/2019, 10:44 AM

## 2019-06-09 LAB — COMPREHENSIVE METABOLIC PANEL
ALT: 14 U/L (ref 0–44)
AST: 31 U/L (ref 15–41)
Albumin: 3 g/dL — ABNORMAL LOW (ref 3.5–5.0)
Alkaline Phosphatase: 81 U/L (ref 38–126)
Anion gap: 8 (ref 5–15)
BUN: 27 mg/dL — ABNORMAL HIGH (ref 6–20)
CO2: 20 mmol/L — ABNORMAL LOW (ref 22–32)
Calcium: 8.7 mg/dL — ABNORMAL LOW (ref 8.9–10.3)
Chloride: 111 mmol/L (ref 98–111)
Creatinine, Ser: 0.65 mg/dL (ref 0.61–1.24)
GFR calc Af Amer: >60 mL/min (ref >60)
GFR calc non Af Amer: >60 mL/min (ref >60)
Glucose, Bld: 102 mg/dL — ABNORMAL HIGH (ref 70–99)
Potassium: 4 mmol/L (ref 3.5–5.1)
Sodium: 139 mmol/L (ref 135–145)
Total Bilirubin: 0.3 mg/dL (ref 0.3–1.2)
Total Protein: 5.9 g/dL — ABNORMAL LOW (ref 6.5–8.1)

## 2019-06-09 LAB — CBC
HCT: 26.9 % — ABNORMAL LOW (ref 39.0–52.0)
Hemoglobin: 8.2 g/dL — ABNORMAL LOW (ref 13.0–17.0)
MCH: 30.7 pg (ref 26.0–34.0)
MCHC: 30.5 g/dL (ref 30.0–36.0)
MCV: 100.7 fL — ABNORMAL HIGH (ref 80.0–100.0)
Platelets: 195 10*3/uL (ref 150–400)
RBC: 2.67 MIL/uL — ABNORMAL LOW (ref 4.22–5.81)
RDW: 14.2 % (ref 11.5–15.5)
WBC: 5.4 10*3/uL (ref 4.0–10.5)
nRBC: 0 % (ref 0.0–0.2)

## 2019-06-09 LAB — GLUCOSE, CAPILLARY
Glucose-Capillary: 68 mg/dL — ABNORMAL LOW (ref 70–99)
Glucose-Capillary: 82 mg/dL (ref 70–99)
Glucose-Capillary: 104 mg/dL — ABNORMAL HIGH (ref 70–99)
Glucose-Capillary: 146 mg/dL — ABNORMAL HIGH (ref 70–99)
Glucose-Capillary: 278 mg/dL — ABNORMAL HIGH (ref 70–99)
Glucose-Capillary: 353 mg/dL — ABNORMAL HIGH (ref 70–99)

## 2019-06-09 LAB — MAGNESIUM: Magnesium: 2 mg/dL (ref 1.7–2.4)

## 2019-06-09 LAB — PHOSPHORUS: Phosphorus: 4.9 mg/dL — ABNORMAL HIGH (ref 2.5–4.6)

## 2019-06-09 MED ORDER — INSULIN ASPART 100 UNIT/ML ~~LOC~~ SOLN
2.0000 [IU] | Freq: Three times a day (TID) | SUBCUTANEOUS | Status: DC
  Administered 2019-06-09 (×3): 2 [IU] via SUBCUTANEOUS

## 2019-06-09 NOTE — Progress Notes (Signed)
PROGRESS NOTE    Rodney Barber  ESP:[id number]  DOB: [birth year]  DOA: 05/13/2019 PCP: [name], NP  Brief Narrative:  53 year old male with history of IDDM, HTN, chronic pancreatitis, smoking with recent prolonged admission in the hospital complicated by suicidal ideation (cleared later), deconditioning, now homeless presented with extreme weakness and leg cramps, EMS found with blood sugars of 500. Patient stated he wasout of insulin for more than a week. ED course:  blood pressures elevated. Potassium < 2. Phos <1. EKG with U waves.  Hospital course:Patient admitted to the hospital with hyperglycemia and severe hypokalemia and suspected UTI. Patient reported diarrhea secondary to taking lactulose at home for high ammonia. HgbA1C 14.3. Insulin resumed, potassium corrected. Urine culture grew MSSA, started on Rocephin. Noted to have left-sided neck abscess, also grew MSSA. Blood cultures, drawn after being on Rocephin, negative so far. ID consulted, recommended 6 weeks of IV cefazolin. Remains in the hospital to finish antibiotics, until 06/25/2019 due to history of IV drug abuse, unsafe to discharge with PICC line. CM assisting with homeless situation.  Subjective:  No acute c/o. Resting comfortably.  Was somewhat hypoglycemic today although reports eating as usual.  Aware of ongoing care plan for IV antibiotics.  Objective: Vitals:   06/08/19 0439 06/08/19 1311 06/08/19 2121 06/09/19 0443  BP: 140/76 123/74 139/77 131/74  Pulse: 78 80 72 70  Resp: 18 19 18 18   Temp: 98 F (36.7 C) 98 F (36.7 C) 97.9 F (36.6 C) 98.6 F (37 C)  TempSrc: Oral Oral Oral Oral  SpO2: 97% 99% 100% 99%  Weight:      Height:        Intake/Output Summary (Last 24 hours) at 06/09/2019 1354 Last data filed at 06/09/2019 0813 Gross per 24 hour  Intake 1344.01 ml  Output -  Net 1344.01 ml   Filed Weights   05/14/19 0240 05/20/19 1221 06/02/19 1916  Weight: 46.4 kg 46.4 kg 51.7 kg     Physical Examination:  General exam: Appears calm and comfortable  Respiratory system: Clear to auscultation. Respiratory effort normal. Cardiovascular system: S1 & S2 heard, RRR. No JVD, murmurs, rubs, gallops or clicks. No pedal edema. Gastrointestinal system: Abdomen is nondistended, soft and nontender. No organomegaly or masses felt. Normal bowel sounds heard. Central nervous system: Alert and oriented. No focal neurological deficits. Extremities: Symmetric 5 x 5 power. Skin: No rashes, lesions or ulcers Psychiatry: Judgement and insight appear normal. Mood & affect appropriate.     Data Reviewed: I have personally reviewed following labs and imaging studies  CBC: Recent Labs  Lab 06/03/19 0522 06/05/19 0453 06/07/19 0506 06/09/19 0455  WBC 6.0 6.0 5.4 5.4  HGB 8.2* 8.4* 8.3* 8.2*  HCT 26.8* 27.9* 28.3* 26.9*  MCV 100.0 101.1* 103.3* 100.7*  PLT 230 210 193 195   Basic Metabolic Panel: Recent Labs  Lab 06/03/19 0522 06/05/19 0453 06/07/19 0506 06/09/19 0455  NA 139 136 139 139  K 4.1 3.8 4.5 4.0  CL 109 108 108 111  CO2 24 23 24  20*  GLUCOSE 182* 155* 115* 102*  BUN 21* 23* 24* 27*  CREATININE 0.66 0.65 0.66 0.65  CALCIUM 8.9 8.7* 8.6* 8.7*  MG 1.8  --   --  2.0  PHOS 5.1*  --   --  4.9*   GFR: Estimated Creatinine Clearance: 77.2 mL/min (by C-G formula based on SCr of 0.65 mg/dL). Liver Function Tests: Recent Labs  Lab 06/09/19 0455  AST 31  ALT 14  ALKPHOS 81  BILITOT 0.3  PROT 5.9*  ALBUMIN 3.0*   No results for input(s): LIPASE, AMYLASE in the last 168 hours. No results for input(s): AMMONIA in the last 168 hours. Coagulation Profile: No results for input(s): INR, PROTIME in the last 168 hours. Cardiac Enzymes: No results for input(s): CKTOTAL, CKMB, CKMBINDEX, TROPONINI in the last 168 hours. BNP (last 3 results) No results for input(s): PROBNP in the last 8760 hours. HbA1C: No results for input(s): HGBA1C in the last 72 hours. CBG:  Recent Labs  Lab 06/08/19 1713 06/08/19 2119 06/09/19 0746 06/09/19 0823 06/09/19 1153  GLUCAP 172* 127* 68* 82 104*   Lipid Profile: No results for input(s): CHOL, HDL, LDLCALC, TRIG, CHOLHDL, LDLDIRECT in the last 72 hours. Thyroid Function Tests: No results for input(s): TSH, T4TOTAL, FREET4, T3FREE, THYROIDAB in the last 72 hours. Anemia Panel: Recent Labs    06/08/19 0515  VITAMINB12 422  FOLATE 21.5   Sepsis Labs: No results for input(s): PROCALCITON, LATICACIDVEN in the last 168 hours.  No results found for this or any previous visit (from the past 240 hour(s)).    Radiology Studies: No results found.      Scheduled Meds: . amLODipine  5 mg Oral Daily  . atorvastatin  20 mg Oral q1800  . escitalopram  5 mg Oral Daily  . feeding supplement (GLUCERNA SHAKE)  237 mL Oral BID BM  . feeding supplement (PRO-STAT SUGAR FREE 64)  30 mL Oral BID  . folic acid  1 mg Oral Daily  . influenza vac split quadrivalent PF  0.5 mL Intramuscular Tomorrow-1000  . insulin aspart  0-5 Units Subcutaneous QHS  . insulin aspart  0-9 Units Subcutaneous TID WC  . insulin aspart  2 Units Subcutaneous TID WC  . insulin glargine  8 Units Subcutaneous BID  . lipase/protease/amylase  12,000 Units Oral TID WC  . lisinopril  10 mg Oral Daily  . magnesium oxide  400 mg Oral BID  . multivitamin with minerals  1 tablet Oral Daily  . nicotine  14 mg Transdermal Daily  . thiamine  100 mg Oral Daily   Continuous Infusions: . sodium chloride 250 mL (06/09/19 1146)  .  ceFAZolin (ANCEF) IV 2 g (06/09/19 1328)    Assessment & Plan:   MSSA UTI, MSSA left neck abscess, C-spine osteomyelitis, MSSA bacteremia-TEE showed no vegetations. ID recommended continuing cefazolin until 11/18, reconsult ID before the discharge date to assess if patient needs continued oral antibiotics  Diabetes mellitus, brittle, uncontrolled with hyperglycemia on presentation:-Hemoglobin A1c in 02/2019 was 17.7, repeat  hemoglobin A1c 14.3. CBGs fairly stable on NovoLog 3 units 3 times daily AC, Lantus 6 units twice daily.  It appears that his regimen was increased to 80 units Lantus twice daily and NovoLog 5 units before meals sometime last week.  Will reduce premeal insulin to 2 units for today given hypoglycemia.  Continue SSI   Electrolyte abnormalities including profound hypokalemia, hypophosphatemia--At the time of admission potassium less than 2.0, phosphorus less than 1.0.Electrolytes within normal limits,phos slightly elevated now at 4.9 to 5.1, K, mag stable  Essential hypertension-BP stable, continue lisinopril, amlodipine  History of depression-Currently stable, no SI, continue Lexapro  History of smoking-Continue nicotine patch, counseled on smoking cessation  Homeless-Case management assisting  Protein calorie malnutrition, BMI 15.57-dietitian consulted and recommendations appreciated   DVT prophylaxis: SCDs Code Status: DNR Family / Patient Communication: d/w patient Disposition Plan: Inpatient stay until completion of IV abx  LOS: 26 days    Time spent: 15 minutes    Guilford Shi, MD [facility] Pager [phone]  If 7PM-7AM, please contact night-coverage [email or web address] Password [email or web address] 06/09/2019, 1:54 PM

## 2019-06-09 NOTE — Progress Notes (Signed)
Hypoglycemic Event  CBG: 68  Treatment: 4 oz juice/soda  Symptoms: None  Follow-up CBG: Time:0825 CBG Result:82  Possible Reasons for Event: Unknown  Comments/MD notified:Kamineni    Rodney Barber

## 2019-06-09 NOTE — Progress Notes (Signed)
Inpatient Diabetes Program Recommendations  AACE/ADA: New Consensus Statement on Inpatient Glycemic Control (2015)  Target Ranges:  Prepandial:   less than 140 mg/dL      Peak postprandial:   less than 180 mg/dL (1-2 hours)      Critically ill patients:  140 - 180 mg/dL   Results for STAR, CHEESE (MRN [id number]) as of 06/09/2019 10:01  Ref. Range 06/08/2019 07:29 06/08/2019 11:28 06/08/2019 17:13 06/08/2019 21:19  Glucose-Capillary Latest Ref Range: 70 - 99 mg/dL 85 149 (H) 172 (H) 127 (H)   Results for AYDRIAN, HALPIN (MRN [id number]) as of 06/09/2019 10:01  Ref. Range 06/09/2019 07:46  Glucose-Capillary Latest Ref Range: 70 - 99 mg/dL 68 (L)     Home DM Meds: Levemir 7 units QHS  Current Orders: Lantus 8 units BID      Novolog 2 units TID with meals      Novolog Sensitive Correction Scale/ SSI (0-9 units) TID AC + HS      MD- Patient with Mild Hypoglycemia this AM (CBG 68).  Please consider reducing Lantus to 6 units BID     --Will follow patient during hospitalization--  Wyn Quaker RN, MSN, CDE Diabetes Coordinator Inpatient Glycemic Control Team Team Pager: [phone] (8a-5p)

## 2019-06-09 NOTE — Progress Notes (Signed)
Nutrition Follow-up  RD working remotely.  DOCUMENTATION CODES:   Underweight, Severe malnutrition in context of chronic illness  INTERVENTION:   - Continue Glucerna Shake po BID, each supplement provides 220 kcal and 10 grams of protein  - Continue Pro-stat 30 ml BID, each supplement provides 100 kcal and 15 grams of protein  - Continue MV with minerals daily  NUTRITION DIAGNOSIS:   Severe Malnutrition related to chronic illness as evidenced by severe fat depletion, severe muscle depletion.  Ongoing, being addressed via oral nutrition supplements  GOAL:   Patient will meet greater than or equal to 90% of their needs  Progressing  MONITOR:   PO intake, Supplement acceptance, Labs, Weight trends  REASON FOR ASSESSMENT:   Malnutrition Screening Tool    ASSESSMENT:   53 year old male with history of insulin-dependent DM, HTN, chronic pancreatitis, smoker, and recent prolonged hospitalization complicated by suicidal ideation later cleared, deconditioning, now homeless. He presented to the ED on 10/6 with extreme weakness and leg cramps. EMS found CBG to be >500 mg/dl and in the ED he was hypertensive. He was admitted for hyperglycemia and hypokalemia. Patient reported being out of insulin x1 week PTA and having diarrhea at home 2/2 taking lactulose.  Pt to remain hospitalized until completion of 6 weeks of IV abx therapy.  Pt accepting 100% of Glucerna Shakes and Pro-stat per MAR. Will continue with current supplement regimen.  Weight up 11 lbs since 10/13. Weight gain is favorable in a severely malnourished and underweight patient.  Meal Completion: 100% x last 7 recorded meals  Medications reviewed and include: Glucerna BID, Pro-stat BID, folic acid, SSI, Novolog 2 units TID with meals, Lantus 8 units BID, Creon 12,000 units TID with meals, magnesium oxide, MVI with minerals, thiamine, IV abx  Labs reviewed: phosphorus 4.9, hemoglobin 8.2 CBG's: 68-172 x 24  hours  Diet Order:   Diet Order            Diet Carb Modified Fluid consistency: Thin; Room service appropriate? Yes  Diet effective now              EDUCATION NEEDS:   Not appropriate for education at this time  Skin:  Skin Assessment: Reviewed RN Assessment  Last BM:  06/09/19  Height:   Ht Readings from Last 1 Encounters:  05/20/19 5\' 8"  (1.727 m)    Weight:   Wt Readings from Last 1 Encounters:  06/02/19 51.7 kg    Ideal Body Weight:  70 kg  BMI:  Body mass index is 17.32 kg/m.  Estimated Nutritional Needs:   Kcal:  1855-2090 kcal (40-45 kcal/kg)  Protein:  80-90 grams  Fluid:  >/= 2 L/day    Gaynell Face, MS, RD, LDN Inpatient Clinical Dietitian Pager: [phone] Weekend/After Hours: [phone]

## 2019-06-10 LAB — GLUCOSE, CAPILLARY
Glucose-Capillary: 68 mg/dL — ABNORMAL LOW (ref 70–99)
Glucose-Capillary: 108 mg/dL — ABNORMAL HIGH (ref 70–99)
Glucose-Capillary: 119 mg/dL — ABNORMAL HIGH (ref 70–99)
Glucose-Capillary: 201 mg/dL — ABNORMAL HIGH (ref 70–99)
Glucose-Capillary: 125 mg/dL — ABNORMAL HIGH (ref 70–99)

## 2019-06-10 MED ORDER — INSULIN ASPART 100 UNIT/ML ~~LOC~~ SOLN
4.0000 [IU] | Freq: Three times a day (TID) | SUBCUTANEOUS | Status: DC
  Administered 2019-06-10 – 2019-06-25 (×46): 4 [IU] via SUBCUTANEOUS

## 2019-06-10 MED ORDER — INSULIN GLARGINE 100 UNIT/ML ~~LOC~~ SOLN
6.0000 [IU] | Freq: Two times a day (BID) | SUBCUTANEOUS | Status: DC
  Administered 2019-06-10 – 2019-06-12 (×5): 6 [IU] via SUBCUTANEOUS
  Filled 2019-06-10 (×6): qty 0.06

## 2019-06-10 NOTE — Progress Notes (Signed)
Inpatient Diabetes Program Recommendations  AACE/ADA: New Consensus Statement on Inpatient Glycemic Control (2015)  Target Ranges:  Prepandial:   less than 140 mg/dL      Peak postprandial:   less than 180 mg/dL (1-2 hours)      Critically ill patients:  140 - 180 mg/dL   Results for TALHA, ISER (MRN [id number]) as of 06/10/2019 09:30  Ref. Range 06/09/2019 07:46 06/09/2019 08:23 06/09/2019 11:53 06/09/2019 16:37 06/09/2019 20:52 06/09/2019 23:09  Glucose-Capillary Latest Ref Range: 70 - 99 mg/dL 68 (L) 82  2 units NOVOLOG +  8 units LANTUS 104 (H)  2 units NOVOLOG  146 (H)  3 units NOVOLOG  278 (H) 353 (H)  5 units NOVOLOG +  8 units LANTUS   Results for BREYER, TEJERA (MRN [id number]) as of 06/10/2019 09:30  Ref. Range 06/10/2019 08:29  Glucose-Capillary Latest Ref Range: 70 - 99 mg/dL 68 (L)    Home DM Meds: Levemir 7 units QHS  Current Orders: Lantus 8 units BID                            Novolog 4 units TID with meals                            Novolog Sensitive Correction Scale/ SSI (0-9 units) TID AC + HS      MD- Patient with Mild Hypoglycemia yesterday AM and again this AM (CBG 68).  Please consider reducing Lantus to 6 units BID     --Will follow patient during hospitalization--  Wyn Quaker RN, MSN, CDE Diabetes Coordinator Inpatient Glycemic Control Team Team Pager: [phone] (8a-5p)

## 2019-06-10 NOTE — Progress Notes (Signed)
PROGRESS NOTE    Rodney Barber  YZJ:[id number]  DOB: Jul 03, 1966  DOA: 05/13/2019 PCP: [name], NP  Brief Narrative:  53 year old male with history of IDDM, HTN, chronic pancreatitis, smoking with recent prolonged admission in the hospital complicated by suicidal ideation (cleared later), deconditioning, now homeless presented with extreme weakness and leg cramps, EMS found with blood sugars of 500. Patient stated he wasout of insulin for more than a week. ED course:  blood pressures elevated. Potassium < 2. Phos <1. EKG with U waves.  Hospital course:Patient admitted to the hospital with hyperglycemia and severe hypokalemia and suspected UTI. Patient reported diarrhea secondary to taking lactulose at home for high ammonia. HgbA1C 14.3. Insulin resumed, potassium corrected. Urine culture grew MSSA, started on Rocephin. Noted to have left-sided neck abscess, also grew MSSA. Blood cultures, drawn after being on Rocephin, negative so far. ID consulted, recommended 6 weeks of IV cefazolin. Remains in the hospital to finish antibiotics, until 06/25/2019 due to history of IV drug abuse, unsafe to discharge with PICC line. CM assisting with homeless situation.  Subjective:  Patient resting comfortably.  BG went upto 240-300s yesterday but dropped to 63 this morning again. Aware of ongoing care plan for IV antibiotics.  Objective: Vitals:   06/09/19 0443 06/09/19 1515 06/09/19 2051 06/10/19 0457  BP: 131/74 128/75 (!) 147/79 138/75  Pulse: 70 75 77 73  Resp: 18 16 16 16   Temp: 98.6 F (37 C) 98.4 F (36.9 C) 97.8 F (36.6 C) 97.7 F (36.5 C)  TempSrc: Oral Oral Oral Oral  SpO2: 99% 99% 98% 99%  Weight:      Height:        Intake/Output Summary (Last 24 hours) at 06/10/2019 1134 Last data filed at 06/10/2019 1015 Gross per 24 hour  Intake 1421.7 ml  Output -  Net 1421.7 ml   Filed Weights   05/14/19 0240 05/20/19 1221 06/02/19 1916  Weight: 46.4 kg 46.4 kg 51.7 kg     Physical Examination:  General exam: Appears calm and comfortable  Respiratory system: Clear to auscultation. Respiratory effort normal. Cardiovascular system: S1 & S2 heard, RRR. No JVD, murmurs, rubs, gallops or clicks. No pedal edema. Gastrointestinal system: Abdomen is nondistended, soft and nontender. No organomegaly or masses felt. Normal bowel sounds heard. Central nervous system: Alert and oriented. No focal neurological deficits. Extremities: Symmetric 5 x 5 power. Skin: No rashes, lesions or ulcers Psychiatry: Judgement and insight appear normal. Mood & affect appropriate.     Data Reviewed: I have personally reviewed following labs and imaging studies  CBC: Recent Labs  Lab 06/05/19 0453 06/07/19 0506 06/09/19 0455  WBC 6.0 5.4 5.4  HGB 8.4* 8.3* 8.2*  HCT 27.9* 28.3* 26.9*  MCV 101.1* 103.3* 100.7*  PLT 210 193 195   Basic Metabolic Panel: Recent Labs  Lab 06/05/19 0453 06/07/19 0506 06/09/19 0455  NA 136 139 139  K 3.8 4.5 4.0  CL 108 108 111  CO2 23 24 20*  GLUCOSE 155* 115* 102*  BUN 23* 24* 27*  CREATININE 0.65 0.66 0.65  CALCIUM 8.7* 8.6* 8.7*  MG  --   --  2.0  PHOS  --   --  4.9*   GFR: Estimated Creatinine Clearance: 77.2 mL/min (by C-G formula based on SCr of 0.65 mg/dL). Liver Function Tests: Recent Labs  Lab 06/09/19 0455  AST 31  ALT 14  ALKPHOS 81  BILITOT 0.3  PROT 5.9*  ALBUMIN 3.0*   No results for input(s):  LIPASE, AMYLASE in the last 168 hours. No results for input(s): AMMONIA in the last 168 hours. Coagulation Profile: No results for input(s): INR, PROTIME in the last 168 hours. Cardiac Enzymes: No results for input(s): CKTOTAL, CKMB, CKMBINDEX, TROPONINI in the last 168 hours. BNP (last 3 results) No results for input(s): PROBNP in the last 8760 hours. HbA1C: No results for input(s): HGBA1C in the last 72 hours. CBG: Recent Labs  Lab 06/09/19 1637 06/09/19 2052 06/09/19 2309 06/10/19 0829 06/10/19 0926   GLUCAP 146* 278* 353* 68* 108*   Lipid Profile: No results for input(s): CHOL, HDL, LDLCALC, TRIG, CHOLHDL, LDLDIRECT in the last 72 hours. Thyroid Function Tests: No results for input(s): TSH, T4TOTAL, FREET4, T3FREE, THYROIDAB in the last 72 hours. Anemia Panel: Recent Labs    06/08/19 0515  VITAMINB12 422  FOLATE 21.5   Sepsis Labs: No results for input(s): PROCALCITON, LATICACIDVEN in the last 168 hours.  No results found for this or any previous visit (from the past 240 hour(s)).    Radiology Studies: No results found.      Scheduled Meds: . amLODipine  5 mg Oral Daily  . atorvastatin  20 mg Oral q1800  . escitalopram  5 mg Oral Daily  . feeding supplement (GLUCERNA SHAKE)  237 mL Oral BID BM  . feeding supplement (PRO-STAT SUGAR FREE 64)  30 mL Oral BID  . folic acid  1 mg Oral Daily  . influenza vac split quadrivalent PF  0.5 mL Intramuscular Tomorrow-1000  . insulin aspart  0-5 Units Subcutaneous QHS  . insulin aspart  0-9 Units Subcutaneous TID WC  . insulin aspart  4 Units Subcutaneous TID WC  . insulin glargine  6 Units Subcutaneous BID  . lipase/protease/amylase  12,000 Units Oral TID WC  . lisinopril  10 mg Oral Daily  . magnesium oxide  400 mg Oral BID  . multivitamin with minerals  1 tablet Oral Daily  . nicotine  14 mg Transdermal Daily  . thiamine  100 mg Oral Daily   Continuous Infusions: . sodium chloride 10 mL/hr at 06/10/19 0200  .  ceFAZolin (ANCEF) IV 2 g (06/10/19 0547)    Assessment & Plan:   MSSA UTI, MSSA left neck abscess, C-spine osteomyelitis, MSSA bacteremia-TEE showed no vegetations. ID recommended continuing cefazolin until 11/18, reconsult ID before the discharge date to assess if patient needs continued oral antibiotics  Diabetes mellitus, brittle, uncontrolled with hyperglycemia on presentation:-Hemoglobin A1c in 02/2019 was 17.7, repeat hemoglobin A1c 14.3. CBGs fairly stable on NovoLog 3 units 3 times daily AC, Lantus 6  units twice daily early last week.  It appears that his regimen was increased to 8 units Lantus twice daily and NovoLog 5 units before meals sometime late last week- Now with intermittent hypoglycemic events-- titrating insulin regimen per Diabetes educator recommendations  Electrolyte abnormalities including profound hypokalemia, hypophosphatemia--At the time of admission potassium less than 2.0, phosphorus less than 1.0.Electrolytes within normal limits,phos slightly elevated now at 4.9 to 5.1, K, mag stable  Essential hypertension-BP stable, continue lisinopril, amlodipine  History of depression-Currently stable, no SI, continue Lexapro  History of smoking-Continue nicotine patch, counseled on smoking cessation  Homeless-Case management assisting  Protein calorie malnutrition, BMI 15.57-dietitian consulted and recommendations appreciated   DVT prophylaxis: SCDs Code Status: DNR Family / Patient Communication: d/w patient Disposition Plan: Inpatient stay until completion of IV abx     LOS: 27 days    Time spent: 15 minutes    Guilford Shi, MD  [facility] Pager [phone]  If 7PM-7AM, please contact night-coverage [email or web address] Password [facility] 06/10/2019, 11:34 AM

## 2019-06-10 NOTE — Progress Notes (Signed)
Hypoglycemic Event  CBG: 68  Treatment: 4 oz juice/soda  Symptoms: None  Follow-up CBG: MGNO:[id number] CBG Result:108  Possible Reasons for Event: Unknown  Comments/MD notified:Kamineni    Cerra Eisenhower A

## 2019-06-10 NOTE — Progress Notes (Addendum)
Occupational Therapy Treatment and Discharge Patient Details Name: Rodney Barber MRN: [id number] DOB: [birth year] Today's Date: 06/10/2019    History of present illness 53 year old male with history of insulin-dependent diabetes, hypertension, chronic pancreatitis, smoker, recent prolonged admission in the hospital complicated with initial suicidal ideation later cleared, deconditioning, now homeless presents to the emergency room with extreme weakness and leg cramps, EMS found with blood sugars of 500,severe hypokalemia, hypophosphatemia, and MSSA UTI , MSSA left neck abscess and C-spine osteomyelitis   OT comments  OT goals met- pt overall S- mod I with ADL activity. Pt verbalizes he will continue BUE exercise as well as performing ADL activity at mod I level. OT will discharge pt this day. Pt agreeable   Follow Up Recommendations  No OT follow up -    Equipment Recommendations  None recommended by OT    Recommendations for Other Services      Precautions / Restrictions Restrictions Weight Bearing Restrictions: No       Mobility Bed Mobility Overal bed mobility: Modified Independent       Supine to sit: Modified independent (Device/Increase time)        Transfers Overall transfer level: Needs assistance Equipment used: Straight cane Transfers: Sit to/from Stand Sit to Stand: Min guard         General transfer comment: for safety    Balance Overall balance assessment: Mild deficits observed, not formally tested                                         ADL either performed or assessed with clinical judgement   ADL Overall ADL's : Needs assistance/impaired     Grooming: Wash/dry hands;Standing;Supervision/safety   Upper Body Bathing: Modified independent   Lower Body Bathing: Modified independent   Upper Body Dressing : Modified independent   Lower Body Dressing: Modified independent   Toilet Transfer: Modified Independent;Cueing for  safety;Cueing for sequencing   Toileting- Clothing Manipulation and Hygiene: Modified independent;Sit to/from stand;Cueing for safety       Functional mobility during ADLs: Modified independent General ADL Comments: Pt performed HEP with yellow theraband sitting in chair. Pt mod I with exercise     Vision Baseline Vision/History: No visual deficits            Cognition Arousal/Alertness: Awake/alert Behavior During Therapy: WFL for tasks assessed/performed Overall Cognitive Status: Within Functional Limits for tasks assessed                                          Exercises General Exercises - Upper Extremity Shoulder Flexion: AROM;Both;Theraband Theraband Level (Shoulder Flexion): Level 1 (Yellow) Shoulder ABduction: AROM;Both;10 reps;Theraband Theraband Level (Shoulder Abduction): Level 1 (Yellow) Shoulder ADduction: AROM;Both;10 reps;Theraband Theraband Level (Shoulder Adduction): Level 1 (Yellow) Shoulder Horizontal ABduction: AROM;Both;10 reps;Theraband Theraband Level (Shoulder Horizontal Abduction): Level 1 (Yellow) Shoulder Horizontal ADduction: AROM;Both;10 reps;Theraband Theraband Level (Shoulder Horizontal Adduction): Level 1 (Yellow) Elbow Flexion: AROM;Both;10 reps;Theraband Theraband Level (Elbow Flexion): Level 1 (Yellow) Elbow Extension: AROM;Both;10 reps;Theraband Theraband Level (Elbow Extension): Level 1 (Yellow)           Pertinent Vitals/ Pain       Pain Assessment: No/denies pain         Frequency  Min 2X/week        Progress Toward Goals  OT Goals(current goals can now be found in the care plan section)  Progress towards OT goals: Goals met/education completed, patient discharged from OT  Acute Rehab OT Goals Patient Stated Goal: move better OT Goal Formulation: With patient Time For Goal Achievement: 06/19/19 Potential to Achieve Goals: Good  Plan Discharge plan remains appropriate       AM-PAC OT "6 Clicks"  Daily Activity     Outcome Measure   Help from another person eating meals?: None Help from another person taking care of personal grooming?: None Help from another person toileting, which includes using toliet, bedpan, or urinal?: None Help from another person bathing (including washing, rinsing, drying)?: None Help from another person to put on and taking off regular upper body clothing?: None Help from another person to put on and taking off regular lower body clothing?: None 6 Click Score: 24    End of Session    OT Visit Diagnosis: Unsteadiness on feet (R26.81)   Activity Tolerance Patient tolerated treatment well   Patient Left with call bell/phone within reach;in chair   Nurse Communication Mobility status        Time: 1130-1145 OT Time Calculation (min): 15 min  Charges: OT General Charges $OT Visit: 1 Visit OT Treatments $Self Care/Home Management : 8-22 mins  Lise Auer, OT Acute Rehabilitation Services [phone] Office- [phone]      Kiev Labrosse, Karin Golden D 06/10/2019, 12:56 PM

## 2019-06-11 LAB — GLUCOSE, CAPILLARY
Glucose-Capillary: 109 mg/dL — ABNORMAL HIGH (ref 70–99)
Glucose-Capillary: 78 mg/dL (ref 70–99)
Glucose-Capillary: 162 mg/dL — ABNORMAL HIGH (ref 70–99)
Glucose-Capillary: 252 mg/dL — ABNORMAL HIGH (ref 70–99)

## 2019-06-11 NOTE — Progress Notes (Signed)
PROGRESS NOTE    Rodney Barber  JJO:[id number]  DOB: [birth year]  DOA: 05/13/2019 PCP: Gildardo Pounds, NP  Brief Narrative:  53 year old male with history of IDDM, HTN, chronic pancreatitis, smoking with recent prolonged admission in the hospital complicated by suicidal ideation (cleared later), deconditioning, now homeless presented with extreme weakness and leg cramps, EMS found with blood sugars of 500. Patient stated he wasout of insulin for more than a week. ED course:  blood pressures elevated. Potassium < 2. Phos <1. EKG with U waves.  Hospital course:Patient admitted to the hospital with hyperglycemia and severe hypokalemia and suspected UTI. Patient reported diarrhea secondary to taking lactulose at home for high ammonia. HgbA1C 14.3. Insulin resumed, potassium corrected. Urine culture grew MSSA, started on Rocephin. Noted to have left-sided neck abscess, also grew MSSA. Blood cultures, drawn after being on Rocephin, negative so far. ID consulted, recommended 6 weeks of IV cefazolin.Remains in the hospital to finish antibiotics, until 06/25/2019 due to history of IV drug abuse, unsafe to discharge with PICC line. CM assisting with homeless situation.  Subjective: Patient resting comfortably. BG better controlled today. No new c/o Objective: Vitals:   06/10/19 1321 06/10/19 2107 06/11/19 0435 06/11/19 1355  BP: 128/72 125/71 130/71 126/77  Pulse: 76 76 81 76  Resp: (!) 21 18 20 19   Temp: 97.8 F (36.6 C) 98.7 F (37.1 C) 98 F (36.7 C) 98.2 F (36.8 C)  TempSrc: Oral Oral  Oral  SpO2: 100% 99% 97% 99%  Weight:      Height:        Intake/Output Summary (Last 24 hours) at 06/11/2019 1505 Last data filed at 06/11/2019 0438 Gross per 24 hour  Intake 1068.58 ml  Output -  Net 1068.58 ml   Filed Weights   05/14/19 0240 05/20/19 1221 06/02/19 1916  Weight: 46.4 kg 46.4 kg 51.7 kg    Physical Examination:  General exam: Appears calm and comfortable  Respiratory  system: Clear to auscultation. Respiratory effort normal. Cardiovascular system: S1 & S2 heard, RRR. No JVD, murmurs, rubs, gallops or clicks. No pedal edema. Gastrointestinal system: Abdomen is nondistended, soft and nontender. No organomegaly or masses felt. Normal bowel sounds heard. Central nervous system: Alert and oriented. No focal neurological deficits. Extremities: Symmetric 5 x 5 power. Skin: No rashes, lesions or ulcers Psychiatry: Judgement and insight appear normal. Mood & affect appropriate.     Data Reviewed: I have personally reviewed following labs and imaging studies  CBC: Recent Labs  Lab 06/05/19 0453 06/07/19 0506 06/09/19 0455  WBC 6.0 5.4 5.4  HGB 8.4* 8.3* 8.2*  HCT 27.9* 28.3* 26.9*  MCV 101.1* 103.3* 100.7*  PLT 210 193 160   Basic Metabolic Panel: Recent Labs  Lab 06/05/19 0453 06/07/19 0506 06/09/19 0455  NA 136 139 139  K 3.8 4.5 4.0  CL 108 108 111  CO2 23 24 20*  GLUCOSE 155* 115* 102*  BUN 23* 24* 27*  CREATININE 0.65 0.66 0.65  CALCIUM 8.7* 8.6* 8.7*  MG  --   --  2.0  PHOS  --   --  4.9*   GFR: Estimated Creatinine Clearance: 77.2 mL/min (by C-G formula based on SCr of 0.65 mg/dL). Liver Function Tests: Recent Labs  Lab 06/09/19 0455  AST 31  ALT 14  ALKPHOS 81  BILITOT 0.3  PROT 5.9*  ALBUMIN 3.0*   No results for input(s): LIPASE, AMYLASE in the last 168 hours. No results for input(s): AMMONIA in the last 168 hours.  Coagulation Profile: No results for input(s): INR, PROTIME in the last 168 hours. Cardiac Enzymes: No results for input(s): CKTOTAL, CKMB, CKMBINDEX, TROPONINI in the last 168 hours. BNP (last 3 results) No results for input(s): PROBNP in the last 8760 hours. HbA1C: No results for input(s): HGBA1C in the last 72 hours. CBG: Recent Labs  Lab 06/10/19 1138 06/10/19 1706 06/10/19 2105 06/11/19 0729 06/11/19 1118  GLUCAP 119* 201* 125* 109* 78   Lipid Profile: No results for input(s): CHOL, HDL,  LDLCALC, TRIG, CHOLHDL, LDLDIRECT in the last 72 hours. Thyroid Function Tests: No results for input(s): TSH, T4TOTAL, FREET4, T3FREE, THYROIDAB in the last 72 hours. Anemia Panel: No results for input(s): VITAMINB12, FOLATE, FERRITIN, TIBC, IRON, RETICCTPCT in the last 72 hours. Sepsis Labs: No results for input(s): PROCALCITON, LATICACIDVEN in the last 168 hours.  No results found for this or any previous visit (from the past 240 hour(s)).    Radiology Studies: No results found.      Scheduled Meds: . amLODipine  5 mg Oral Daily  . atorvastatin  20 mg Oral q1800  . escitalopram  5 mg Oral Daily  . feeding supplement (GLUCERNA SHAKE)  237 mL Oral BID BM  . feeding supplement (PRO-STAT SUGAR FREE 64)  30 mL Oral BID  . folic acid  1 mg Oral Daily  . influenza vac split quadrivalent PF  0.5 mL Intramuscular Tomorrow-1000  . insulin aspart  0-5 Units Subcutaneous QHS  . insulin aspart  0-9 Units Subcutaneous TID WC  . insulin aspart  4 Units Subcutaneous TID WC  . insulin glargine  6 Units Subcutaneous BID  . lipase/protease/amylase  12,000 Units Oral TID WC  . lisinopril  10 mg Oral Daily  . magnesium oxide  400 mg Oral BID  . multivitamin with minerals  1 tablet Oral Daily  . nicotine  14 mg Transdermal Daily  . thiamine  100 mg Oral Daily   Continuous Infusions: . sodium chloride 250 mL (06/10/19 1922)  .  ceFAZolin (ANCEF) IV 2 g (06/11/19 1437)    Assessment & Plan:   MSSA UTI, MSSA left neck abscess, C-spine osteomyelitis, MSSA bacteremia-TEE showed no vegetations. ID recommended continuing cefazolin until 11/18, reconsult ID before the discharge date to assess if patient needs continued oral antibiotics  Diabetes mellitus, brittle, uncontrolled with hyperglycemia on presentation:-Hemoglobin A1c in 02/2019 was 17.7, repeat hemoglobin A1c 14.3. CBGs fairly stable on NovoLog 3 units 3 times daily AC, Lantus 6 units twice daily early last week.  It appears that his  regimen was increased to 8 units Lantus twice daily and NovoLog 5 units before meals sometime late last week- Now with intermittent hypoglycemic events-- titrating insulin regimen per Diabetes educator recommendations  Electrolyte abnormalities including profound hypokalemia, hypophosphatemia--At the time of admission potassium less than 2.0, phosphorus less than 1.0.Electrolytes within normal limits,phos slightly elevated now at 4.9 to 5.1, K, mag stable  Essential hypertension-BP stable, continue lisinopril, amlodipine  History of depression-Currently stable, no SI, continue Lexapro  History of smoking-Continue nicotine patch, counseled on smoking cessation  Homeless-Case management assisting  Protein calorie malnutrition, BMI 15.57-dietitian consulted and recommendations appreciated   DVT prophylaxis: SCDs Code Status: DNR Family / Patient Communication: d/w patient Disposition Plan: Inpatient stay until completion of IV abx     LOS: 28 days    Time spent: 15 minutes    Alessandra Bevels, MD [facility] Pager [phone]  If 7PM-7AM, please contact night-coverage [email or web address] Password [email or web address] 06/11/2019, 3:05 PM

## 2019-06-12 LAB — GLUCOSE, CAPILLARY
Glucose-Capillary: 190 mg/dL — ABNORMAL HIGH (ref 70–99)
Glucose-Capillary: 150 mg/dL — ABNORMAL HIGH (ref 70–99)
Glucose-Capillary: 174 mg/dL — ABNORMAL HIGH (ref 70–99)
Glucose-Capillary: 152 mg/dL — ABNORMAL HIGH (ref 70–99)

## 2019-06-12 NOTE — Progress Notes (Signed)
PROGRESS NOTE    Rodney Barber  YWV:[id number]  DOB: 1966/04/29  DOA: 05/13/2019 PCP: Gildardo Pounds, NP  Brief Narrative:  53 year old male with history of IDDM, HTN, chronic pancreatitis, smoking with recent prolonged admission in the hospital complicated by suicidal ideation (cleared later), deconditioning, now homeless presented with extreme weakness and leg cramps, EMS found with blood sugars of 500. Patient stated he wasout of insulin for more than a week. ED course:  blood pressures elevated. Potassium < 2. Phos <1. EKG with U waves.  Hospital course:Patient admitted to the hospital with hyperglycemia and severe hypokalemia and suspected UTI. Patient reported diarrhea secondary to taking lactulose at home for high ammonia. HgbA1C 14.3. Insulin resumed, potassium corrected. Urine culture grew MSSA, started on Rocephin. Noted to have left-sided neck abscess, also grew MSSA. Blood cultures, drawn after being on Rocephin, negative so far. ID consulted, recommended 6 weeks of IV cefazolin.Remains in the hospital to finish antibiotics, until 06/25/2019 due to history of IV drug abuse, unsafe to discharge with PICC line. CM assisting with homeless situation.  Subjective: Patient resting comfortably. BG fluctuates from 78--252 in last 24 hrs. Denies eating any additional snacks. No other c/o Objective: Vitals:   06/11/19 0435 06/11/19 1355 06/11/19 2159 06/12/19 0653  BP: 130/71 126/77 (!) 149/80 137/82  Pulse: 81 76 75 79  Resp: 20 19 16 16   Temp: 98 F (36.7 C) 98.2 F (36.8 C) 98.2 F (36.8 C) 98.2 F (36.8 C)  TempSrc:  Oral Oral Oral  SpO2: 97% 99% 100% 94%  Weight:      Height:        Intake/Output Summary (Last 24 hours) at 06/12/2019 0856 Last data filed at 06/12/2019 0647 Gross per 24 hour  Intake 680 ml  Output -  Net 680 ml   Filed Weights   05/14/19 0240 05/20/19 1221 06/02/19 1916  Weight: 46.4 kg 46.4 kg 51.7 kg    Physical Examination:  General  exam: Appears calm and comfortable  Respiratory system: Clear to auscultation. Respiratory effort normal. Cardiovascular system: S1 & S2 heard, RRR. No JVD, murmurs, rubs, gallops or clicks. No pedal edema. Gastrointestinal system: Abdomen is nondistended, soft and nontender. No organomegaly or masses felt. Normal bowel sounds heard. Central nervous system: Alert and oriented. No focal neurological deficits. Extremities: Symmetric 5 x 5 power. Skin: No rashes, lesions or ulcers Psychiatry: Judgement and insight appear normal. Mood & affect appropriate.     Data Reviewed: I have personally reviewed following labs and imaging studies  CBC: Recent Labs  Lab 06/07/19 0506 06/09/19 0455  WBC 5.4 5.4  HGB 8.3* 8.2*  HCT 28.3* 26.9*  MCV 103.3* 100.7*  PLT 193 854   Basic Metabolic Panel: Recent Labs  Lab 06/07/19 0506 06/09/19 0455  NA 139 139  K 4.5 4.0  CL 108 111  CO2 24 20*  GLUCOSE 115* 102*  BUN 24* 27*  CREATININE 0.66 0.65  CALCIUM 8.6* 8.7*  MG  --  2.0  PHOS  --  4.9*   GFR: Estimated Creatinine Clearance: 77.2 mL/min (by C-G formula based on SCr of 0.65 mg/dL). Liver Function Tests: Recent Labs  Lab 06/09/19 0455  AST 31  ALT 14  ALKPHOS 81  BILITOT 0.3  PROT 5.9*  ALBUMIN 3.0*   No results for input(s): LIPASE, AMYLASE in the last 168 hours. No results for input(s): AMMONIA in the last 168 hours. Coagulation Profile: No results for input(s): INR, PROTIME in the last 168 hours. Cardiac  Enzymes: No results for input(s): CKTOTAL, CKMB, CKMBINDEX, TROPONINI in the last 168 hours. BNP (last 3 results) No results for input(s): PROBNP in the last 8760 hours. HbA1C: No results for input(s): HGBA1C in the last 72 hours. CBG: Recent Labs  Lab 06/11/19 0729 06/11/19 1118 06/11/19 1622 06/11/19 2156 06/12/19 0730  GLUCAP 109* 78 162* 252* 190*   Lipid Profile: No results for input(s): CHOL, HDL, LDLCALC, TRIG, CHOLHDL, LDLDIRECT in the last 72  hours. Thyroid Function Tests: No results for input(s): TSH, T4TOTAL, FREET4, T3FREE, THYROIDAB in the last 72 hours. Anemia Panel: No results for input(s): VITAMINB12, FOLATE, FERRITIN, TIBC, IRON, RETICCTPCT in the last 72 hours. Sepsis Labs: No results for input(s): PROCALCITON, LATICACIDVEN in the last 168 hours.  No results found for this or any previous visit (from the past 240 hour(s)).    Radiology Studies: No results found.      Scheduled Meds: . amLODipine  5 mg Oral Daily  . atorvastatin  20 mg Oral q1800  . escitalopram  5 mg Oral Daily  . feeding supplement (GLUCERNA SHAKE)  237 mL Oral BID BM  . feeding supplement (PRO-STAT SUGAR FREE 64)  30 mL Oral BID  . folic acid  1 mg Oral Daily  . influenza vac split quadrivalent PF  0.5 mL Intramuscular Tomorrow-1000  . insulin aspart  0-5 Units Subcutaneous QHS  . insulin aspart  0-9 Units Subcutaneous TID WC  . insulin aspart  4 Units Subcutaneous TID WC  . insulin glargine  6 Units Subcutaneous BID  . lipase/protease/amylase  12,000 Units Oral TID WC  . lisinopril  10 mg Oral Daily  . magnesium oxide  400 mg Oral BID  . multivitamin with minerals  1 tablet Oral Daily  . nicotine  14 mg Transdermal Daily  . thiamine  100 mg Oral Daily   Continuous Infusions: . sodium chloride 250 mL (06/10/19 1922)  .  ceFAZolin (ANCEF) IV 2 g (06/12/19 7829)    Assessment & Plan:   MSSA UTI, MSSA left neck abscess, C-spine osteomyelitis, MSSA bacteremia-TEE showed no vegetations. ID recommended continuing cefazolin until 11/18, reconsult ID before the discharge date to assess if patient needs continued oral antibiotics  Diabetes mellitus, brittle, uncontrolled with hyperglycemia on presentation:-Hemoglobin A1c in 02/2019 was 17.7, repeat hemoglobin A1c 14.3. CBGs fairly stable on NovoLog 3 units 3 times daily AC, Lantus 6 units twice daily early last week.  It appears that his regimen was increased to 8 units Lantus twice daily  and NovoLog 5 units before meals sometime late last week- Now with intermittent hypoglycemic events-- titrating insulin regimen per Diabetes educator recommendations  Electrolyte abnormalities including profound hypokalemia, hypophosphatemia--At the time of admission potassium less than 2.0, phosphorus less than 1.0.Electrolytes within normal limits,phos slightly elevated now at 4.9 to 5.1, K, mag stable  Essential hypertension-BP stable, continue lisinopril, amlodipine  History of depression-Currently stable, no SI, continue Lexapro  History of smoking-Continue nicotine patch, counseled on smoking cessation  Homeless-Case management assisting  Protein calorie malnutrition, BMI 15.57-dietitian consulted and recommendations appreciated   DVT prophylaxis: SCDs Code Status: DNR Family / Patient Communication: d/w patient Disposition Plan: Inpatient stay until completion of IV abx     LOS: 29 days    Time spent: 15 minutes    Alessandra Bevels, MD [facility] Pager [phone]  If 7PM-7AM, please contact night-coverage [email or web address] Password [facility] 06/12/2019, 8:56 AM

## 2019-06-13 LAB — GLUCOSE, CAPILLARY
Glucose-Capillary: 91 mg/dL (ref 70–99)
Glucose-Capillary: 209 mg/dL — ABNORMAL HIGH (ref 70–99)
Glucose-Capillary: 79 mg/dL (ref 70–99)
Glucose-Capillary: 303 mg/dL — ABNORMAL HIGH (ref 70–99)

## 2019-06-13 MED ORDER — INSULIN GLARGINE 100 UNIT/ML ~~LOC~~ SOLN
8.0000 [IU] | Freq: Every day | SUBCUTANEOUS | Status: DC
  Administered 2019-06-13 – 2019-06-15 (×3): 8 [IU] via SUBCUTANEOUS
  Filled 2019-06-13 (×3): qty 0.08

## 2019-06-13 NOTE — Progress Notes (Signed)
PROGRESS NOTE    Rodney Barber  XBM:[id number]  DOB: 07-Apr-1966  DOA: 05/13/2019 PCP: [name], NP  Brief Narrative:  53 year old male with history of IDDM, HTN, chronic pancreatitis, smoking with recent prolonged admission in the hospital complicated by suicidal ideation (cleared later), deconditioning, now homeless presented with extreme weakness and leg cramps, EMS found with blood sugars of 500. Patient stated he wasout of insulin for more than a week. ED course:  blood pressures elevated. Potassium < 2. Phos <1. EKG with U waves.  Hospital course:Patient admitted to the hospital with hyperglycemia and severe hypokalemia and suspected UTI. Patient reported diarrhea secondary to taking lactulose at home for high ammonia. HgbA1C 14.3. Insulin resumed, potassium corrected. Urine culture grew MSSA, started on Rocephin. Noted to have left-sided neck abscess, also grew MSSA. Blood cultures, drawn after being on Rocephin, negative so far. ID consulted, recommended 6 weeks of IV cefazolin.Remains in the hospital to finish antibiotics, until 06/25/2019 due to history of IV drug abuse, unsafe to discharge with PICC line. CM assisting with homeless situation.  Subjective: Patient resting comfortably. BG fluctuates from 70--200s although mostly the pattern of low BG levels have been early morning around 7 to 8am. Denies eating any additional snacks. No other c/o Objective: Vitals:   06/12/19 0653 06/12/19 1436 06/12/19 2022 06/13/19 0423  BP: 137/82 140/74 129/70 (!) 153/81  Pulse: 79 87 79 76  Resp: 16 16 18 16   Temp: 98.2 F (36.8 C) 98.9 F (37.2 C) 98.4 F (36.9 C) 98.2 F (36.8 C)  TempSrc: Oral Oral Oral Oral  SpO2: 94% 97% 96% 95%  Weight:      Height:        Intake/Output Summary (Last 24 hours) at 06/13/2019 0758 Last data filed at 06/12/2019 1600 Gross per 24 hour  Intake 368.01 ml  Output -  Net 368.01 ml   Filed Weights   05/14/19 0240 05/20/19 1221 06/02/19  1916  Weight: 46.4 kg 46.4 kg 51.7 kg    Physical Examination:  General exam: Appears calm and comfortable  Respiratory system: Clear to auscultation. Respiratory effort normal. Cardiovascular system: S1 & S2 heard, RRR. No JVD, murmurs, rubs, gallops or clicks. No pedal edema. Gastrointestinal system: Abdomen is nondistended, soft and nontender. No organomegaly or masses felt. Normal bowel sounds heard. Central nervous system: Alert and oriented. No focal neurological deficits. Extremities: Symmetric 5 x 5 power. Skin: No rashes, lesions or ulcers Psychiatry: Judgement and insight appear normal. Mood & affect appropriate.     Data Reviewed: I have personally reviewed following labs and imaging studies  CBC: Recent Labs  Lab 06/07/19 0506 06/09/19 0455  WBC 5.4 5.4  HGB 8.3* 8.2*  HCT 28.3* 26.9*  MCV 103.3* 100.7*  PLT 193 195   Basic Metabolic Panel: Recent Labs  Lab 06/07/19 0506 06/09/19 0455  NA 139 139  K 4.5 4.0  CL 108 111  CO2 24 20*  GLUCOSE 115* 102*  BUN 24* 27*  CREATININE 0.66 0.65  CALCIUM 8.6* 8.7*  MG  --  2.0  PHOS  --  4.9*   GFR: Estimated Creatinine Clearance: 77.2 mL/min (by C-G formula based on SCr of 0.65 mg/dL). Liver Function Tests: Recent Labs  Lab 06/09/19 0455  AST 31  ALT 14  ALKPHOS 81  BILITOT 0.3  PROT 5.9*  ALBUMIN 3.0*   No results for input(s): LIPASE, AMYLASE in the last 168 hours. No results for input(s): AMMONIA in the last 168 hours. Coagulation Profile:  No results for input(s): INR, PROTIME in the last 168 hours. Cardiac Enzymes: No results for input(s): CKTOTAL, CKMB, CKMBINDEX, TROPONINI in the last 168 hours. BNP (last 3 results) No results for input(s): PROBNP in the last 8760 hours. HbA1C: No results for input(s): HGBA1C in the last 72 hours. CBG: Recent Labs  Lab 06/12/19 0730 06/12/19 1137 06/12/19 1603 06/12/19 2116 06/13/19 0754  GLUCAP 190* 150* 174* 152* 91   Lipid Profile: No results  for input(s): CHOL, HDL, LDLCALC, TRIG, CHOLHDL, LDLDIRECT in the last 72 hours. Thyroid Function Tests: No results for input(s): TSH, T4TOTAL, FREET4, T3FREE, THYROIDAB in the last 72 hours. Anemia Panel: No results for input(s): VITAMINB12, FOLATE, FERRITIN, TIBC, IRON, RETICCTPCT in the last 72 hours. Sepsis Labs: No results for input(s): PROCALCITON, LATICACIDVEN in the last 168 hours.  No results found for this or any previous visit (from the past 240 hour(s)).    Radiology Studies: No results found.      Scheduled Meds: . amLODipine  5 mg Oral Daily  . atorvastatin  20 mg Oral q1800  . escitalopram  5 mg Oral Daily  . feeding supplement (GLUCERNA SHAKE)  237 mL Oral BID BM  . feeding supplement (PRO-STAT SUGAR FREE 64)  30 mL Oral BID  . folic acid  1 mg Oral Daily  . influenza vac split quadrivalent PF  0.5 mL Intramuscular Tomorrow-1000  . insulin aspart  0-5 Units Subcutaneous QHS  . insulin aspart  0-9 Units Subcutaneous TID WC  . insulin aspart  4 Units Subcutaneous TID WC  . insulin glargine  8 Units Subcutaneous Daily  . lipase/protease/amylase  12,000 Units Oral TID WC  . lisinopril  10 mg Oral Daily  . magnesium oxide  400 mg Oral BID  . multivitamin with minerals  1 tablet Oral Daily  . nicotine  14 mg Transdermal Daily  . thiamine  100 mg Oral Daily   Continuous Infusions: . sodium chloride Stopped (06/11/19 1718)  .  ceFAZolin (ANCEF) IV 2 g (06/13/19 0940)    Assessment & Plan:   MSSA UTI, MSSA left neck abscess, C-spine osteomyelitis, MSSA bacteremia-TEE showed no vegetations. ID recommended continuing cefazolin until 11/18, reconsult ID before the discharge date to assess if patient needs continued oral antibiotics  Diabetes mellitus, brittle, uncontrolled with hyperglycemia on presentation:-Hemoglobin A1c in 02/2019 was 17.7, repeat hemoglobin A1c 14.3. It appears that he was hyperglycemic episodes on prior insulin regimen-- increased to 8 units  Lantus twice daily and NovoLog 5 units before meals sometime late last week- noted to have intermittent hypoglycemic events this week and titrated insulin regimen down to Lantus 6 units BID / SA pre-meal insulin 4 units. Appreciate Diabetes educator recommendations. Give low BG levels mostly in the mornings, will change Lantus to once daily regimen and continue current dose of premeal insulin.   Electrolyte abnormalities including profound hypokalemia, hypophosphatemia--At the time of admission potassium less than 2.0, phosphorus less than 1.0.Electrolytes within normal limits,phos slightly elevated now at 4.9 to 5.1, K, mag stable. Repeat labs in am  Essential hypertension-BP stable, continue lisinopril, amlodipine  History of depression-Currently stable, no SI, continue Lexapro  History of smoking-Continue nicotine patch, counseled on smoking cessation  Homeless-Case management assisting  Protein calorie malnutrition, BMI 15.57-dietitian consulted and recommendations appreciated   DVT prophylaxis: SCDs Code Status: DNR Family / Patient Communication: d/w patient Disposition Plan: Inpatient stay until completion of IV abx     LOS: 30 days    Time spent: 15  minutes    Guilford Shi, MD [facility] Pager [phone]  If 7PM-7AM, please contact night-coverage [email or web address] Password [facility] 06/13/2019, 7:58 AM

## 2019-06-14 LAB — COMPREHENSIVE METABOLIC PANEL
ALT: 17 U/L (ref 0–44)
AST: 49 U/L — ABNORMAL HIGH (ref 15–41)
Albumin: 3.2 g/dL — ABNORMAL LOW (ref 3.5–5.0)
Alkaline Phosphatase: 85 U/L (ref 38–126)
Anion gap: 9 (ref 5–15)
BUN: 28 mg/dL — ABNORMAL HIGH (ref 6–20)
CO2: 22 mmol/L (ref 22–32)
Calcium: 8.8 mg/dL — ABNORMAL LOW (ref 8.9–10.3)
Chloride: 109 mmol/L (ref 98–111)
Creatinine, Ser: 0.73 mg/dL (ref 0.61–1.24)
GFR calc Af Amer: >60 mL/min (ref >60)
GFR calc non Af Amer: >60 mL/min (ref >60)
Glucose, Bld: 213 mg/dL — ABNORMAL HIGH (ref 70–99)
Potassium: 4.7 mmol/L (ref 3.5–5.1)
Sodium: 140 mmol/L (ref 135–145)
Total Bilirubin: 0.4 mg/dL (ref 0.3–1.2)
Total Protein: 6 g/dL — ABNORMAL LOW (ref 6.5–8.1)

## 2019-06-14 LAB — GLUCOSE, CAPILLARY
Glucose-Capillary: 259 mg/dL — ABNORMAL HIGH (ref 70–99)
Glucose-Capillary: 189 mg/dL — ABNORMAL HIGH (ref 70–99)
Glucose-Capillary: 172 mg/dL — ABNORMAL HIGH (ref 70–99)
Glucose-Capillary: 228 mg/dL — ABNORMAL HIGH (ref 70–99)

## 2019-06-14 LAB — MAGNESIUM: Magnesium: 1.9 mg/dL (ref 1.7–2.4)

## 2019-06-14 LAB — PHOSPHORUS: Phosphorus: 4.8 mg/dL — ABNORMAL HIGH (ref 2.5–4.6)

## 2019-06-14 NOTE — Progress Notes (Signed)
[facility]                                                                              Patient Demographics  Rodney Barber, is a 53 y.o. male, DOB - 03-24-1966, ZOX:[id number]  Admit date - 05/13/2019   Admitting Physician [name], MD  Outpatient Primary MD for the patient is [name], NP  Outpatient specialists:   LOS - 31  days   Medical records reviewed and are as summarized below:    Chief Complaint  Patient presents with   Hyperglycemia   Weakness       Brief summary   53 year old male with history of insulin-dependent diabetes, hypertension, chronic pancreatitis, smoker, recent prolonged admission in the hospital complicated with initial suicidal ideation later cleared, deconditioning, now homeless presents to the emergency room with extreme weakness and leg cramps, EMS found with blood sugars of 500.  In the emergency room blood pressures elevated.  Potassium less than 2.  EKG with U waves.   Patient was admitted to the hospital with hyperglycemia and severe hypokalemia as well UTI like presentation. Patient stated that he was out of insulin for more than a week.  Patient had diarrhea secondary to taking lactulose at home for high ammonia.  After admission, his urine culture grew MSSA, was on Rocephin, he had left-sided neck abscess, that also grew MSSA.  Blood cultures negative, however it was drawn after being on Rocephin. Remains in the hospital to finish antibiotics, until 06/25/2019 due to history of IV drug abuse, unsafe to discharge with PICC line    Assessment & Plan   MSSA UTI, MSSA left neck abscess, C-spine osteomyelitis, MSSA bacteremia -Urine culture with MSSA, left neck aspirated with MSSA, blood cultures 10/9 had shown MSSA -TEE showed no vegetations -Continue IV Ancef for 6 weeks -Per ID, continue cefazolin until 11/18, reconsult ID before the discharge date to assess if patient needs continued oral  antibiotics.    Electrolyte abnormalities including profound hypokalemia, hypophosphatemia admission -At the time of admission potassium less than 2.0, phosphorus less than 1.0 -Electrolytes WNL  Diabetes mellitus, brittle, uncontrolled IDDM with hypoglycemia and hyperglycemia -Hemoglobin A1c in 02/2019 was 17.7, repeat hemoglobin A1c 14.3 -CBGs currently stable, continue Lantus 8 units daily, sliding scale insulin, meal coverage 4 units 3 times daily AC -Network engineer following  Essential hypertension BP stable, continue lisinopril, amlodipine  History of depression Continue Lexapro, no SI  History of smoking Continue nicotine patch  Homeless Case management assisting  Protein calorie malnutrition, moderate to severe -BMI 17.3, registered dietitian consulted  Code Status: DNR DVT Prophylaxis: SCDs Family Communication: Discussed all imaging results, lab results, explained to the patient    Disposition Plan: Remains inpatient to complete IV antibiotics for 6 weeks till 06/25/2019 due to history of IV drug abuse.  No acute issues.    Time Spent in minutes 20 minutes  Procedures:  Left neck aspiration  Consultants:   ID  Antimicrobials:   Anti-infectives (From admission, onward)   Start     Dose/Rate Route Frequency Ordered Stop   05/17/19 1400  ceFAZolin (ANCEF) IVPB 2g/100 mL premix  2 g 200 mL/hr over 30 Minutes Intravenous Every 8 hours 05/17/19 1353     05/16/19 0200  vancomycin (VANCOCIN) 500 mg in sodium chloride 0.9 % 100 mL IVPB  Status:  Discontinued     500 mg 100 mL/hr over 60 Minutes Intravenous Every 12 hours 05/15/19 1348 05/17/19 1337   05/15/19 1300  vancomycin (VANCOCIN) IVPB 1000 mg/200 mL premix     1,000 mg 200 mL/hr over 60 Minutes Intravenous  Once 05/15/19 1250 05/15/19 2003   05/14/19 2200  cefTRIAXone (ROCEPHIN) 1 g in sodium chloride 0.9 % 100 mL IVPB  Status:  Discontinued     1 g 200 mL/hr over 30 Minutes Intravenous Every  24 hours 05/13/19 2227 05/15/19 1152   05/13/19 2200  cefTRIAXone (ROCEPHIN) 2 g in sodium chloride 0.9 % 100 mL IVPB     2 g 200 mL/hr over 30 Minutes Intravenous  Once 05/13/19 2150 05/14/19 0014         Medications  Scheduled Meds:  amLODipine  5 mg Oral Daily   atorvastatin  20 mg Oral q1800   escitalopram  5 mg Oral Daily   feeding supplement (GLUCERNA SHAKE)  237 mL Oral BID BM   feeding supplement (PRO-STAT SUGAR FREE 64)  30 mL Oral BID   folic acid  1 mg Oral Daily   influenza vac split quadrivalent PF  0.5 mL Intramuscular Tomorrow-1000   insulin aspart  0-5 Units Subcutaneous QHS   insulin aspart  0-9 Units Subcutaneous TID WC   insulin aspart  4 Units Subcutaneous TID WC   insulin glargine  8 Units Subcutaneous Daily   lipase/protease/amylase  12,000 Units Oral TID WC   lisinopril  10 mg Oral Daily   magnesium oxide  400 mg Oral BID   multivitamin with minerals  1 tablet Oral Daily   nicotine  14 mg Transdermal Daily   thiamine  100 mg Oral Daily   Continuous Infusions:  sodium chloride Stopped (06/11/19 1718)    ceFAZolin (ANCEF) IV 2 g (06/14/19 1450)   PRN Meds:.sodium chloride, acetaminophen **OR** acetaminophen, hydrALAZINE, Muscle Rub, oxyCODONE-acetaminophen      Subjective:   Rodney Barber was seen and examined today.  Denies any acute complaints.  No acute neck pain.  No focal weakness.  No fevers.  Watching TV.  Objective:   Vitals:   06/13/19 2013 06/14/19 0532 06/14/19 0751 06/14/19 1333  BP: (!) 144/77 (!) 145/81 (!) 155/83 139/80  Pulse: 79 71 72 73  Resp: 16 16 18 19   Temp: 99.1 F (37.3 C) 98.1 F (36.7 C) 98 F (36.7 C) 98 F (36.7 C)  TempSrc: Oral Oral Oral Oral  SpO2: 97% 100% 98% 96%  Weight:      Height:        Intake/Output Summary (Last 24 hours) at 06/14/2019 1456 Last data filed at 06/14/2019 1300 Gross per 24 hour  Intake 1334 ml  Output --  Net 1334 ml     Wt Readings from Last 3  Encounters:  06/02/19 51.7 kg  02/22/19 49.3 kg  12/04/18 59 kg    Physical Exam  General: Alert and oriented x 3, NAD  Eyes:   HEENT:  Atraumatic, normocephalic  Cardiovascular: S1 S2 clear, no murmurs, RRR. No pedal edema b/l  Respiratory: CTAB, no wheezing, rales or rhonchi  Gastrointestinal: Soft, nontender, nondistended, NBS  Ext: no pedal edema bilaterally  Neuro: no new deficits  Musculoskeletal: No cyanosis, clubbing  Skin: No rashes  Psych: Normal affect and demeanor, alert and oriented x3     Data Reviewed:  I have personally reviewed following labs and imaging studies  Micro Results No results found for this or any previous visit (from the past 240 hour(s)).  Radiology Reports Ir US Guide Bx Asp/drain  Result Date: 05/16/2019 INDICATION: 53 year old male with what appears to be an abscess in the deep musculature of the left posterior neck. He presents for ultrasound-guided aspiration for culture. EXAM: Ultrasound-guided aspiration MEDICATIONS: The patient is currently admitted to the hospital and receiving intravenous antibiotics. The antibiotics were administered within an appropriate time frame prior to the initiation of the procedure. ANESTHESIA/SEDATION: Fentanyl 50 mcg IV; Versed 1 mg IV Moderate Sedation Time:  10 minutes The patient was continuously monitored during the procedure by the interventional radiology nurse under my direct supervision. COMPLICATIONS: None immediate. PROCEDURE: Informed written consent was obtained from the patient after a thorough discussion of the procedural risks, benefits and alternatives. All questions were addressed. A timeout was performed prior to the initiation of the procedure. The region of clinical concern was interrogated with ultrasound. A small but complex fluid collection was successfully identified. The overlying skin was sterilely prepped and draped in the standard fashion using chlorhexidine skin prep. Local  anesthesia was attained by infiltration with 1% lidocaine. Under real-time sonographic guidance, an 18 gauge trocar needle was carefully advanced and positioned with the tip in the fluid collection. Aspiration was then performed while agitating the trocar. Approximately 2 mL of thick purulent material was successfully aspirated. There was no immediate complication. The fluid was sent for Gram stain and culture. IMPRESSION: Successful ultrasound-guided aspiration of small fluid collection in the left posterior neck. Electronically Signed   By: Malachy Moan M.D.   On: 05/16/2019 16:53    Lab Data:  CBC: Recent Labs  Lab 06/09/19 0455  WBC 5.4  HGB 8.2*  HCT 26.9*  MCV 100.7*  PLT 195   Basic Metabolic Panel: Recent Labs  Lab 06/09/19 0455 06/14/19 0429  NA 139 140  K 4.0 4.7  CL 111 109  CO2 20* 22  GLUCOSE 102* 213*  BUN 27* 28*  CREATININE 0.65 0.73  CALCIUM 8.7* 8.8*  MG 2.0 1.9  PHOS 4.9* 4.8*   GFR: Estimated Creatinine Clearance: 77.2 mL/min (by C-G formula based on SCr of 0.73 mg/dL). Liver Function Tests: Recent Labs  Lab 06/09/19 0455 06/14/19 0429  AST 31 49*  ALT 14 17  ALKPHOS 81 85  BILITOT 0.3 0.4  PROT 5.9* 6.0*  ALBUMIN 3.0* 3.2*   No results for input(s): LIPASE, AMYLASE in the last 168 hours. No results for input(s): AMMONIA in the last 168 hours. Coagulation Profile: No results for input(s): INR, PROTIME in the last 168 hours. Cardiac Enzymes: No results for input(s): CKTOTAL, CKMB, CKMBINDEX, TROPONINI in the last 168 hours. BNP (last 3 results) No results for input(s): PROBNP in the last 8760 hours. HbA1C: No results for input(s): HGBA1C in the last 72 hours. CBG: Recent Labs  Lab 06/13/19 1356 06/13/19 1653 06/13/19 2125 06/14/19 0736 06/14/19 1137  GLUCAP 209* 79 303* 259* 189*   Lipid Profile: No results for input(s): CHOL, HDL, LDLCALC, TRIG, CHOLHDL, LDLDIRECT in the last 72 hours. Thyroid Function Tests: No results for  input(s): TSH, T4TOTAL, FREET4, T3FREE, THYROIDAB in the last 72 hours. Anemia Panel: No results for input(s): VITAMINB12, FOLATE, FERRITIN, TIBC, IRON, RETICCTPCT in the last 72 hours. Urine analysis:    Component Value Date/Time  COLORURINE STRAW (A) 05/13/2019 2028   APPEARANCEUR HAZY (A) 05/13/2019 2028   LABSPEC 1.024 05/13/2019 2028   PHURINE 6.0 05/13/2019 2028   GLUCOSEU >=500 (A) 05/13/2019 2028   HGBUR SMALL (A) 05/13/2019 2028   BILIRUBINUR NEGATIVE 05/13/2019 2028   BILIRUBINUR negative 03/15/2018 1049   BILIRUBINUR negative 12/26/2017 0941   KETONESUR NEGATIVE 05/13/2019 2028   PROTEINUR 30 (A) 05/13/2019 2028   UROBILINOGEN 0.2 03/15/2018 1049   UROBILINOGEN 0.2 12/08/2010 1613   NITRITE POSITIVE (A) 05/13/2019 2028   LEUKOCYTESUR LARGE (A) 05/13/2019 2028     Mikalyn Hermida M.D. [facility] 06/14/2019, 2:56 PM  Pager: [phone] Between 7am to 7pm - call Pager - [phone]  After 7pm go to [email or web address] - password [email or web address]  Call night coverage person covering after 7pm

## 2019-06-15 LAB — GLUCOSE, CAPILLARY
Glucose-Capillary: 200 mg/dL — ABNORMAL HIGH (ref 70–99)
Glucose-Capillary: 212 mg/dL — ABNORMAL HIGH (ref 70–99)
Glucose-Capillary: 113 mg/dL — ABNORMAL HIGH (ref 70–99)
Glucose-Capillary: 293 mg/dL — ABNORMAL HIGH (ref 70–99)

## 2019-06-15 MED ORDER — INSULIN GLARGINE 100 UNIT/ML ~~LOC~~ SOLN
5.0000 [IU] | Freq: Two times a day (BID) | SUBCUTANEOUS | Status: DC
  Administered 2019-06-16 – 2019-06-24 (×17): 5 [IU] via SUBCUTANEOUS
  Filled 2019-06-15 (×19): qty 0.05

## 2019-06-15 MED ORDER — INSULIN GLARGINE 100 UNIT/ML ~~LOC~~ SOLN
2.0000 [IU] | Freq: Every day | SUBCUTANEOUS | Status: AC
  Administered 2019-06-15: 2 [IU] via SUBCUTANEOUS
  Filled 2019-06-15: qty 0.02

## 2019-06-15 MED ORDER — INSULIN GLARGINE 100 UNIT/ML ~~LOC~~ SOLN: 5.0000 [IU] | Freq: Two times a day (BID) | SUBCUTANEOUS | Status: DC

## 2019-06-15 NOTE — Progress Notes (Signed)
[facility]                                                                              Patient Demographics  Rodney Barber, is a 53 y.o. male, DOB - [birth year], WUJ:[id number]  Admit date - 05/13/2019   Admitting Physician [name], MD  Outpatient Primary MD for the patient is [name], NP  Outpatient specialists:   LOS - 32  days   Medical records reviewed and are as summarized below:    Chief Complaint  Patient presents with   Hyperglycemia   Weakness       Brief summary   53 year old male with history of insulin-dependent diabetes, hypertension, chronic pancreatitis, smoker, recent prolonged admission in the hospital complicated with initial suicidal ideation later cleared, deconditioning, now homeless presents to the emergency room with extreme weakness and leg cramps, EMS found with blood sugars of 500.  In the emergency room blood pressures elevated.  Potassium less than 2.  EKG with U waves.   Patient was admitted to the hospital with hyperglycemia and severe hypokalemia as well UTI like presentation. Patient stated that he was out of insulin for more than a week.  Patient had diarrhea secondary to taking lactulose at home for high ammonia.  After admission, his urine culture grew MSSA, was on Rocephin, he had left-sided neck abscess, that also grew MSSA.  Blood cultures negative, however it was drawn after being on Rocephin. Remains in the hospital to finish antibiotics, until 06/25/2019 due to history of IV drug abuse, unsafe to discharge with PICC line    Assessment & Plan   MSSA UTI, MSSA left neck abscess, C-spine osteomyelitis, MSSA bacteremia -Urine culture with MSSA, left neck aspirated with MSSA, blood cultures 10/9 had shown MSSA -TEE showed no vegetations -Continue IV Ancef for 6 weeks -Per ID, continue cefazolin until 11/18, reconsult ID before the discharge date to assess if patient needs continued oral  antibiotics. -No complaints  Electrolyte abnormalities including profound hypokalemia, hypophosphatemia admission -At the time of admission potassium less than 2.0, phosphorus less than 1.0 -Electrolytes within normal limits  Diabetes mellitus, brittle, uncontrolled IDDM with hypoglycemia and hyperglycemia -Hemoglobin A1c in 02/2019 was 17.7, repeat hemoglobin A1c 14.3 -Brittle diabetes with episodes of hyper and hypoglycemia.  CBGs today elevated in 200s. -Will change Lantus to 5 units twice daily.  Patient noted to have a.m. blood sugars low, since he has received Lantus 8 units this morning, will give 2 units tonight.  Reassess in the morning for Lantus dosing.  Essential hypertension BP stable, continue lisinopril, amlodipine  History of depression Continue Lexapro, no SI  History of smoking Continue nicotine patch  Homeless Case management assisting  Protein calorie malnutrition, moderate to severe -BMI 17.3, registered dietitian consulted  Code Status: DNR DVT Prophylaxis: SCDs Family Communication: Discussed all imaging results, lab results, explained to the patient    Disposition Plan: Remains inpatient to complete IV antibiotics for 6 weeks till 06/25/2019 due to history of IV drug abuse.  No acute issues.    Time Spent in minutes 20 minutes  Procedures:  Left neck aspiration  Consultants:  ID  Antimicrobials:   Anti-infectives (From admission, onward)   Start     Dose/Rate Route Frequency Ordered Stop   05/17/19 1400  ceFAZolin (ANCEF) IVPB 2g/100 mL premix     2 g 200 mL/hr over 30 Minutes Intravenous Every 8 hours 05/17/19 1353     05/16/19 0200  vancomycin (VANCOCIN) 500 mg in sodium chloride 0.9 % 100 mL IVPB  Status:  Discontinued     500 mg 100 mL/hr over 60 Minutes Intravenous Every 12 hours 05/15/19 1348 05/17/19 1337   05/15/19 1300  vancomycin (VANCOCIN) IVPB 1000 mg/200 mL premix     1,000 mg 200 mL/hr over 60 Minutes Intravenous  Once  05/15/19 1250 05/15/19 2003   05/14/19 2200  cefTRIAXone (ROCEPHIN) 1 g in sodium chloride 0.9 % 100 mL IVPB  Status:  Discontinued     1 g 200 mL/hr over 30 Minutes Intravenous Every 24 hours 05/13/19 2227 05/15/19 1152   05/13/19 2200  cefTRIAXone (ROCEPHIN) 2 g in sodium chloride 0.9 % 100 mL IVPB     2 g 200 mL/hr over 30 Minutes Intravenous  Once 05/13/19 2150 05/14/19 0014         Medications  Scheduled Meds:  amLODipine  5 mg Oral Daily   atorvastatin  20 mg Oral q1800   escitalopram  5 mg Oral Daily   feeding supplement (GLUCERNA SHAKE)  237 mL Oral BID BM   feeding supplement (PRO-STAT SUGAR FREE 64)  30 mL Oral BID   folic acid  1 mg Oral Daily   influenza vac split quadrivalent PF  0.5 mL Intramuscular Tomorrow-1000   insulin aspart  0-5 Units Subcutaneous QHS   insulin aspart  0-9 Units Subcutaneous TID WC   insulin aspart  4 Units Subcutaneous TID WC   insulin glargine  8 Units Subcutaneous Daily   lipase/protease/amylase  12,000 Units Oral TID WC   lisinopril  10 mg Oral Daily   magnesium oxide  400 mg Oral BID   multivitamin with minerals  1 tablet Oral Daily   nicotine  14 mg Transdermal Daily   thiamine  100 mg Oral Daily   Continuous Infusions:  sodium chloride Stopped (06/11/19 1718)    ceFAZolin (ANCEF) IV 2 g (06/15/19 0550)   PRN Meds:.sodium chloride, acetaminophen **OR** acetaminophen, hydrALAZINE, Muscle Rub, oxyCODONE-acetaminophen      Subjective:   Rodney Barber was seen and examined today.  No acute complaints per patient however CBGs running in 200s today.  Yesterday was having hypoglycemia.  No focal weakness or neck pain..  No acute neck pain.  Afebrile  Objective:   Vitals:   06/14/19 0751 06/14/19 1333 06/14/19 2128 06/15/19 0656  BP: (!) 155/83 139/80 (!) 143/83 (!) 159/86  Pulse: 72 73 77 74  Resp: 18 19 16 16   Temp: 98 F (36.7 C) 98 F (36.7 C) 98.8 F (37.1 C) 98.1 F (36.7 C)  TempSrc: Oral Oral  Oral   SpO2: 98% 96% 99% 99%  Weight:      Height:        Intake/Output Summary (Last 24 hours) at 06/15/2019 1320 Last data filed at 06/15/2019 0600 Gross per 24 hour  Intake 232.59 ml  Output --  Net 232.59 ml     Wt Readings from Last 3 Encounters:  06/02/19 51.7 kg  02/22/19 49.3 kg  12/04/18 59 kg   Physical Exam  General: Alert and oriented x 3, NAD  Eyes:   HEENT:  Atraumatic, normocephalic  Cardiovascular: S1 S2 clear, no murmurs, RRR. No pedal edema b/l  Respiratory: CTAB, no wheezing, rales or rhonchi  Gastrointestinal: Soft, nontender, nondistended, NBS  Ext: no pedal edema bilaterally  Neuro: no new deficits  Musculoskeletal: No cyanosis, clubbing  Skin: No rashes  Psych: Normal affect and demeanor, alert and oriented x3   Data Reviewed:  I have personally reviewed following labs and imaging studies  Micro Results No results found for this or any previous visit (from the past 240 hour(s)).  Radiology Reports Ir US Guide Bx Asp/drain  Result Date: 05/16/2019 INDICATION: 53 year old male with what appears to be an abscess in the deep musculature of the left posterior neck. He presents for ultrasound-guided aspiration for culture. EXAM: Ultrasound-guided aspiration MEDICATIONS: The patient is currently admitted to the hospital and receiving intravenous antibiotics. The antibiotics were administered within an appropriate time frame prior to the initiation of the procedure. ANESTHESIA/SEDATION: Fentanyl 50 mcg IV; Versed 1 mg IV Moderate Sedation Time:  10 minutes The patient was continuously monitored during the procedure by the interventional radiology nurse under my direct supervision. COMPLICATIONS: None immediate. PROCEDURE: Informed written consent was obtained from the patient after a thorough discussion of the procedural risks, benefits and alternatives. All questions were addressed. A timeout was performed prior to the initiation of the procedure. The  region of clinical concern was interrogated with ultrasound. A small but complex fluid collection was successfully identified. The overlying skin was sterilely prepped and draped in the standard fashion using chlorhexidine skin prep. Local anesthesia was attained by infiltration with 1% lidocaine. Under real-time sonographic guidance, an 18 gauge trocar needle was carefully advanced and positioned with the tip in the fluid collection. Aspiration was then performed while agitating the trocar. Approximately 2 mL of thick purulent material was successfully aspirated. There was no immediate complication. The fluid was sent for Gram stain and culture. IMPRESSION: Successful ultrasound-guided aspiration of small fluid collection in the left posterior neck. Electronically Signed   By: Malachy Moan M.D.   On: 05/16/2019 16:53    Lab Data:  CBC: Recent Labs  Lab 06/09/19 0455  WBC 5.4  HGB 8.2*  HCT 26.9*  MCV 100.7*  PLT 195   Basic Metabolic Panel: Recent Labs  Lab 06/09/19 0455 06/14/19 0429  NA 139 140  K 4.0 4.7  CL 111 109  CO2 20* 22  GLUCOSE 102* 213*  BUN 27* 28*  CREATININE 0.65 0.73  CALCIUM 8.7* 8.8*  MG 2.0 1.9  PHOS 4.9* 4.8*   GFR: Estimated Creatinine Clearance: 77.2 mL/min (by C-G formula based on SCr of 0.73 mg/dL). Liver Function Tests: Recent Labs  Lab 06/09/19 0455 06/14/19 0429  AST 31 49*  ALT 14 17  ALKPHOS 81 85  BILITOT 0.3 0.4  PROT 5.9* 6.0*  ALBUMIN 3.0* 3.2*   No results for input(s): LIPASE, AMYLASE in the last 168 hours. No results for input(s): AMMONIA in the last 168 hours. Coagulation Profile: No results for input(s): INR, PROTIME in the last 168 hours. Cardiac Enzymes: No results for input(s): CKTOTAL, CKMB, CKMBINDEX, TROPONINI in the last 168 hours. BNP (last 3 results) No results for input(s): PROBNP in the last 8760 hours. HbA1C: No results for input(s): HGBA1C in the last 72 hours. CBG: Recent Labs  Lab 06/14/19 1137  06/14/19 1628 06/14/19 2131 06/15/19 0826 06/15/19 1141  GLUCAP 189* 172* 228* 200* 212*   Lipid Profile: No results for input(s): CHOL, HDL, LDLCALC, TRIG, CHOLHDL, LDLDIRECT in the last 72  hours. Thyroid Function Tests: No results for input(s): TSH, T4TOTAL, FREET4, T3FREE, THYROIDAB in the last 72 hours. Anemia Panel: No results for input(s): VITAMINB12, FOLATE, FERRITIN, TIBC, IRON, RETICCTPCT in the last 72 hours. Urine analysis:    Component Value Date/Time   COLORURINE STRAW (A) 05/13/2019 2028   APPEARANCEUR HAZY (A) 05/13/2019 2028   LABSPEC 1.024 05/13/2019 2028   PHURINE 6.0 05/13/2019 2028   GLUCOSEU >=500 (A) 05/13/2019 2028   HGBUR SMALL (A) 05/13/2019 2028   BILIRUBINUR NEGATIVE 05/13/2019 2028   BILIRUBINUR negative 03/15/2018 1049   BILIRUBINUR negative 12/26/2017 0941   KETONESUR NEGATIVE 05/13/2019 2028   PROTEINUR 30 (A) 05/13/2019 2028   UROBILINOGEN 0.2 03/15/2018 1049   UROBILINOGEN 0.2 12/08/2010 1613   NITRITE POSITIVE (A) 05/13/2019 2028   LEUKOCYTESUR LARGE (A) 05/13/2019 2028     Donnald Tabar M.D. [facility] 06/15/2019, 1:20 PM  Pager: [phone] Between 7am to 7pm - call Pager - [phone]  After 7pm go to [email or web address] - password [email or web address]  Call night coverage person covering after 7pm

## 2019-06-15 NOTE — Progress Notes (Deleted)
Pt c/o abdominal pain and abdominal bloating after NG tube was removed. KUB was ordered and MD will review and speak with surgery. Will keep pt NPO right now until know results. Pt resting comfortable in bed at this current time. 

## 2019-06-16 LAB — GLUCOSE, CAPILLARY
Glucose-Capillary: 171 mg/dL — ABNORMAL HIGH (ref 70–99)
Glucose-Capillary: 172 mg/dL — ABNORMAL HIGH (ref 70–99)
Glucose-Capillary: 294 mg/dL — ABNORMAL HIGH (ref 70–99)
Glucose-Capillary: 142 mg/dL — ABNORMAL HIGH (ref 70–99)

## 2019-06-16 NOTE — Progress Notes (Signed)
[facility]                                                                              Patient Demographics  Rodney Barber, is a 53 y.o. male, DOB - 01-11-1966, RSW:[id number]  Admit date - 05/13/2019   Admitting Physician [name], MD  Outpatient Primary MD for the patient is [name], NP  Outpatient specialists:   LOS - 33  days   Medical records reviewed and are as summarized below:    Chief Complaint  Patient presents with  . Hyperglycemia  . Weakness       Brief summary   53 year old male with history of insulin-dependent diabetes, hypertension, chronic pancreatitis, smoker, recent prolonged admission in the hospital complicated with initial suicidal ideation later cleared, deconditioning, now homeless presents to the emergency room with extreme weakness and leg cramps, EMS found with blood sugars of 500.  In the emergency room blood pressures elevated.  Potassium less than 2.  EKG with U waves.   Patient was admitted to the hospital with hyperglycemia and severe hypokalemia as well UTI like presentation. Patient stated that he was out of insulin for more than a week.  Patient had diarrhea secondary to taking lactulose at home for high ammonia.  After admission, his urine culture grew MSSA, was on Rocephin, he had left-sided neck abscess, that also grew MSSA.  Blood cultures negative, however it was drawn after being on Rocephin. Remains in the hospital to finish antibiotics, until 06/25/2019 due to history of IV drug abuse, unsafe to discharge with PICC line    Assessment & Plan   MSSA UTI, MSSA left neck abscess, C-spine osteomyelitis, MSSA bacteremia -Urine culture with MSSA, left neck aspirated with MSSA, blood cultures 10/9 had shown MSSA -TEE showed no vegetations -Continue IV Ancef for 6 weeks -Per ID, continue cefazolin until 11/18, reconsult ID before the discharge date to assess if patient needs continued oral antibiotics.  -No complaints currently  Electrolyte abnormalities including profound hypokalemia, hypophosphatemia admission -At the time of admission potassium less than 2.0, phosphorus less than 1.0 -Electrolytes within normal limits  Diabetes mellitus, brittle, uncontrolled IDDM with hypoglycemia and hyperglycemia -Hemoglobin A1c in 02/2019 was 17.7, repeat hemoglobin A1c 14.3 -Brittle diabetes with episodes of hyper and hypoglycemia.  CBGs today elevated in 200s. -Currently better, will continue current regimen, Lantus 5 units twice daily, reassess a.m. blood sugars. -Continue meal coverage and sliding scale insulin   Essential hypertension BP stable, continue lisinopril, bladder pain  History of depression Continue Lexapro, no SI  History of smoking Continue nicotine patch  Homeless Case management assisting  Protein calorie malnutrition, moderate to severe -BMI 17.3, registered dietitian consulted  Code Status: DNR DVT Prophylaxis: SCDs Family Communication: Discussed all imaging results, lab results, explained to the patient    Disposition Plan: Remains inpatient to complete IV antibiotics for 6 weeks till 06/25/2019 due to history of IV drug abuse.  No acute issues  Time Spent in minutes 20 minutes  Procedures:  Left neck aspiration  Consultants:   ID  Antimicrobials:   Anti-infectives (From admission, onward)   Start  Dose/Rate Route Frequency Ordered Stop   05/17/19 1400  ceFAZolin (ANCEF) IVPB 2g/100 mL premix     2 g 200 mL/hr over 30 Minutes Intravenous Every 8 hours 05/17/19 1353     05/16/19 0200  vancomycin (VANCOCIN) 500 mg in sodium chloride 0.9 % 100 mL IVPB  Status:  Discontinued     500 mg 100 mL/hr over 60 Minutes Intravenous Every 12 hours 05/15/19 1348 05/17/19 1337   05/15/19 1300  vancomycin (VANCOCIN) IVPB 1000 mg/200 mL premix     1,000 mg 200 mL/hr over 60 Minutes Intravenous  Once 05/15/19 1250 05/15/19 2003   05/14/19 2200  cefTRIAXone  (ROCEPHIN) 1 g in sodium chloride 0.9 % 100 mL IVPB  Status:  Discontinued     1 g 200 mL/hr over 30 Minutes Intravenous Every 24 hours 05/13/19 2227 05/15/19 1152   05/13/19 2200  cefTRIAXone (ROCEPHIN) 2 g in sodium chloride 0.9 % 100 mL IVPB     2 g 200 mL/hr over 30 Minutes Intravenous  Once 05/13/19 2150 05/14/19 0014         Medications  Scheduled Meds: . amLODipine  5 mg Oral Daily  . atorvastatin  20 mg Oral q1800  . escitalopram  5 mg Oral Daily  . feeding supplement (GLUCERNA SHAKE)  237 mL Oral BID BM  . feeding supplement (PRO-STAT SUGAR FREE 64)  30 mL Oral BID  . folic acid  1 mg Oral Daily  . influenza vac split quadrivalent PF  0.5 mL Intramuscular Tomorrow-1000  . insulin aspart  0-5 Units Subcutaneous QHS  . insulin aspart  0-9 Units Subcutaneous TID WC  . insulin aspart  4 Units Subcutaneous TID WC  . insulin glargine  5 Units Subcutaneous BID  . lipase/protease/amylase  12,000 Units Oral TID WC  . lisinopril  10 mg Oral Daily  . magnesium oxide  400 mg Oral BID  . multivitamin with minerals  1 tablet Oral Daily  . nicotine  14 mg Transdermal Daily  . thiamine  100 mg Oral Daily   Continuous Infusions: . sodium chloride 250 mL (06/15/19 1509)  .  ceFAZolin (ANCEF) IV 2 g (06/16/19 1300)   PRN Meds:.sodium chloride, acetaminophen **OR** acetaminophen, hydrALAZINE, Muscle Rub, oxyCODONE-acetaminophen      Subjective:   Rodney Barber was seen and examined today.  No complaints.  No neck pain, focal weakness.  No fevers, nausea or vomiting.  Objective:   Vitals:   06/15/19 1327 06/15/19 2113 06/16/19 0625 06/16/19 1245  BP: 137/78 (!) 155/81 137/79 133/77  Pulse: 76 76 68 72  Resp: 19 20 20    Temp: 97.9 F (36.6 C) 98 F (36.7 C) 97.7 F (36.5 C) 97.7 F (36.5 C)  TempSrc:    Oral  SpO2: 100% 97% 100% 99%  Weight:      Height:        Intake/Output Summary (Last 24 hours) at 06/16/2019 1345 Last data filed at 06/16/2019 0703 Gross per  24 hour  Intake 700 ml  Output -  Net 700 ml     Wt Readings from Last 3 Encounters:  06/02/19 51.7 kg  02/22/19 49.3 kg  12/04/18 59 kg   Physical Exam  General: Alert and oriented x 3, NAD  Eyes  HEENT:    Cardiovascular: S1 S2 clear, no murmurs, RRR. No pedal edema b/l  Respiratory: CTAB, no wheezing, rales or rhonchi  Gastrointestinal: Soft, nontender, nondistended, NBS  Ext: no pedal edema bilaterally  Neuro: no new  deficits  Musculoskeletal: No cyanosis, clubbing  Skin: No rashes  Psych: Normal affect and demeanor, alert and oriented x3    Data Reviewed:  I have personally reviewed following labs and imaging studies  Micro Results No results found for this or any previous visit (from the past 240 hour(s)).  Radiology Reports No results found.  Lab Data:  CBC: No results for input(s): WBC, NEUTROABS, HGB, HCT, MCV, PLT in the last 168 hours. Basic Metabolic Panel: Recent Labs  Lab 06/14/19 0429  NA 140  K 4.7  CL 109  CO2 22  GLUCOSE 213*  BUN 28*  CREATININE 0.73  CALCIUM 8.8*  MG 1.9  PHOS 4.8*   GFR: Estimated Creatinine Clearance: 77.2 mL/min (by C-G formula based on SCr of 0.73 mg/dL). Liver Function Tests: Recent Labs  Lab 06/14/19 0429  AST 49*  ALT 17  ALKPHOS 85  BILITOT 0.4  PROT 6.0*  ALBUMIN 3.2*   No results for input(s): LIPASE, AMYLASE in the last 168 hours. No results for input(s): AMMONIA in the last 168 hours. Coagulation Profile: No results for input(s): INR, PROTIME in the last 168 hours. Cardiac Enzymes: No results for input(s): CKTOTAL, CKMB, CKMBINDEX, TROPONINI in the last 168 hours. BNP (last 3 results) No results for input(s): PROBNP in the last 8760 hours. HbA1C: No results for input(s): HGBA1C in the last 72 hours. CBG: Recent Labs  Lab 06/15/19 1141 06/15/19 1637 06/15/19 2114 06/16/19 0813 06/16/19 1147  GLUCAP 212* 113* 293* 171* 172*   Lipid Profile: No results for input(s): CHOL,  HDL, LDLCALC, TRIG, CHOLHDL, LDLDIRECT in the last 72 hours. Thyroid Function Tests: No results for input(s): TSH, T4TOTAL, FREET4, T3FREE, THYROIDAB in the last 72 hours. Anemia Panel: No results for input(s): VITAMINB12, FOLATE, FERRITIN, TIBC, IRON, RETICCTPCT in the last 72 hours. Urine analysis:    Component Value Date/Time   COLORURINE STRAW (A) 05/13/2019 2028   APPEARANCEUR HAZY (A) 05/13/2019 2028   LABSPEC 1.024 05/13/2019 2028   PHURINE 6.0 05/13/2019 2028   GLUCOSEU >=500 (A) 05/13/2019 2028   HGBUR SMALL (A) 05/13/2019 2028   BILIRUBINUR NEGATIVE 05/13/2019 2028   BILIRUBINUR negative 03/15/2018 1049   BILIRUBINUR negative 12/26/2017 0941   KETONESUR NEGATIVE 05/13/2019 2028   PROTEINUR 30 (A) 05/13/2019 2028   UROBILINOGEN 0.2 03/15/2018 1049   UROBILINOGEN 0.2 12/08/2010 1613   NITRITE POSITIVE (A) 05/13/2019 2028   LEUKOCYTESUR LARGE (A) 05/13/2019 2028     Ripudeep Rai M.D. [facility] 06/16/2019, 1:45 PM  Pager: [phone] Between 7am to 7pm - call Pager - [phone]  After 7pm go to [email or web address] - password [email or web address]  Call night coverage person covering after 7pm

## 2019-06-16 NOTE — Progress Notes (Signed)
Nutrition Follow-up  RD working remotely.  DOCUMENTATION CODES:   Underweight, Severe malnutrition in context of chronic illness  INTERVENTION:   - Recommend obtaining updated weight  - Continue Glucerna Shake po BID, each supplement provides 220 kcal and 10 grams of protein  - Continue Pro-stat 30 ml BID, each supplement provides 100 kcal and 15 grams of protein  - Continue MVI with minerals daily  NUTRITION DIAGNOSIS:   Severe Malnutrition related to chronic illness as evidenced by severe fat depletion, severe muscle depletion.  Ongoing, being addressed via oral nutrition supplements  GOAL:   Patient will meet greater than or equal to 90% of their needs  Progressing  MONITOR:   PO intake, Supplement acceptance, Labs, Weight trends  REASON FOR ASSESSMENT:   Malnutrition Screening Tool    ASSESSMENT:   53 year old male with history of insulin-dependent DM, HTN, chronic pancreatitis, smoker, and recent prolonged hospitalization complicated by suicidal ideation later cleared, deconditioning, now homeless. He presented to the ED on 10/6 with extreme weakness and leg cramps. EMS found CBG to be >500 mg/dl and in the ED he was hypertensive. He was admitted for hyperglycemia and hypokalemia. Patient reported being out of insulin x1 week PTA and having diarrhea at home 2/2 taking lactulose.  Pt to remain hospitalized until completion of 6 weeks of IV abx therapy on 11/18.  Pt accepting almost all of Glucerna shakes and Pro-stat per MAR.  No new weight since 10/26.  Spoke with pt via phone call to room. Pt reports appetite continues to be good and that he is eating well. Pt reports he is taking his supplements.  Pt denies any N/V or other nutrition-related issues. Will continue with current plan of care.  Meal Completion: 100% x last 8 meals  Medications reviewed and include: Glucerna BID, Pro-stat BID, folic acid, SSI, Novolog 4 units TID, Lantus 5 units BID, Creon  12,000 units TID, magnesium oxide, thiamine, MVI with minerals, IV abx  Labs reviewed: phosphorus 4.8  CBG's: 113-293 x 24 hours  Diet Order:   Diet Order            Diet Carb Modified Fluid consistency: Thin; Room service appropriate? Yes  Diet effective now              EDUCATION NEEDS:   Not appropriate for education at this time  Skin:  Skin Assessment: Reviewed RN Assessment  Last BM:  06/15/19  Height:   Ht Readings from Last 1 Encounters:  05/20/19 5\' 8"  (1.727 m)    Weight:   Wt Readings from Last 1 Encounters:  06/02/19 51.7 kg    Ideal Body Weight:  70 kg  BMI:  Body mass index is 17.32 kg/m.  Estimated Nutritional Needs:   Kcal:  1855-2090 kcal (40-45 kcal/kg)  Protein:  80-90 grams  Fluid:  >/= 2 L/day    Gaynell Face, MS, RD, LDN Inpatient Clinical Dietitian Pager: [phone] Weekend/After Hours: [phone]

## 2019-06-17 LAB — BASIC METABOLIC PANEL
Anion gap: 7 (ref 5–15)
BUN: 31 mg/dL — ABNORMAL HIGH (ref 6–20)
CO2: 22 mmol/L (ref 22–32)
Calcium: 8.8 mg/dL — ABNORMAL LOW (ref 8.9–10.3)
Chloride: 109 mmol/L (ref 98–111)
Creatinine, Ser: 0.67 mg/dL (ref 0.61–1.24)
GFR calc Af Amer: >60 mL/min (ref >60)
GFR calc non Af Amer: >60 mL/min (ref >60)
Glucose, Bld: 156 mg/dL — ABNORMAL HIGH (ref 70–99)
Potassium: 3.9 mmol/L (ref 3.5–5.1)
Sodium: 138 mmol/L (ref 135–145)

## 2019-06-17 LAB — CBC
HCT: 27.4 % — ABNORMAL LOW (ref 39.0–52.0)
Hemoglobin: 8.3 g/dL — ABNORMAL LOW (ref 13.0–17.0)
MCH: 30.3 pg (ref 26.0–34.0)
MCHC: 30.3 g/dL (ref 30.0–36.0)
MCV: 100 fL (ref 80.0–100.0)
Platelets: 154 10*3/uL (ref 150–400)
RBC: 2.74 MIL/uL — ABNORMAL LOW (ref 4.22–5.81)
RDW: 14.2 % (ref 11.5–15.5)
WBC: 6.1 10*3/uL (ref 4.0–10.5)
nRBC: 0 % (ref 0.0–0.2)

## 2019-06-17 LAB — GLUCOSE, CAPILLARY
Glucose-Capillary: 147 mg/dL — ABNORMAL HIGH (ref 70–99)
Glucose-Capillary: 158 mg/dL — ABNORMAL HIGH (ref 70–99)
Glucose-Capillary: 256 mg/dL — ABNORMAL HIGH (ref 70–99)
Glucose-Capillary: 116 mg/dL — ABNORMAL HIGH (ref 70–99)

## 2019-06-17 NOTE — Progress Notes (Signed)
[facility]                                                                              Patient Demographics  Rodney Barber, is a 53 y.o. male, DOB - 1965/09/06, FTD:[id number]  Admit date - 05/13/2019   Admitting Physician [name], MD  Outpatient Primary MD for the patient is [name], NP  Outpatient specialists:   LOS - 34  days   Medical records reviewed and are as summarized below:    Chief Complaint  Patient presents with  . Hyperglycemia  . Weakness       Brief summary   53 year old male with history of insulin-dependent diabetes, hypertension, chronic pancreatitis, smoker, recent prolonged admission in the hospital complicated with initial suicidal ideation later cleared, deconditioning, now homeless presents to the emergency room with extreme weakness and leg cramps, EMS found with blood sugars of 500.  In the emergency room blood pressures elevated.  Potassium less than 2.  EKG with U waves.   Patient was admitted to the hospital with hyperglycemia and severe hypokalemia as well UTI like presentation. Patient stated that he was out of insulin for more than a week.  Patient had diarrhea secondary to taking lactulose at home for high ammonia.  After admission, his urine culture grew MSSA, was on Rocephin, he had left-sided neck abscess, that also grew MSSA.  Blood cultures negative, however it was drawn after being on Rocephin. Remains in the hospital to finish antibiotics, until 06/25/2019 due to history of IV drug abuse, unsafe to discharge with PICC line    Assessment & Plan   MSSA UTI, MSSA left neck abscess, C-spine osteomyelitis, MSSA bacteremia -Urine culture with MSSA, left neck aspirated with MSSA, blood cultures 10/9 had shown MSSA -TEE showed no vegetations -Continue IV Ancef for 6 weeks -Per ID, continue cefazolin until 11/18, reconsult ID before the discharge date to assess if patient needs continued oral antibiotics.  -Currently no complaints  Electrolyte abnormalities including profound hypokalemia, hypophosphatemia admission -At the time of admission potassium less than 2.0, phosphorus less than 1.0 -Electrolytes within normal limits  Diabetes mellitus, brittle, uncontrolled IDDM with hypoglycemia and hyperglycemia -Hemoglobin A1c in 02/2019 was 17.7, repeat hemoglobin A1c 14.3 -Brittle diabetes with episodes of hyper and hypoglycemia.  -CBGs better, for now continue current regimen Lantus 5 units twice daily, meal coverage and sliding scale insulin   Essential hypertension BP stable, continue Norvasc, lisinopril, hydralazine IV as needed  History of depression Continue Lexapro, no SI  History of smoking Continue nicotine patch  Homeless Case management assisting  Protein calorie malnutrition, moderate to severe -BMI 17.3, registered dietitian following  Code Status: DNR DVT Prophylaxis: SCDs Family Communication: Discussed all imaging results, lab results, explained to the patient    Disposition Plan: Remains inpatient to complete IV antibiotics for 6 weeks till 06/25/2019 due to history of IV drug abuse.  Currently no acute issues  Time Spent in minutes 20 minutes  Procedures:  Left neck aspiration  Consultants:   ID  Antimicrobials:   Anti-infectives (From admission, onward)   Start     Dose/Rate Route Frequency Ordered  Stop   05/17/19 1400  ceFAZolin (ANCEF) IVPB 2g/100 mL premix     2 g 200 mL/hr over 30 Minutes Intravenous Every 8 hours 05/17/19 1353     05/16/19 0200  vancomycin (VANCOCIN) 500 mg in sodium chloride 0.9 % 100 mL IVPB  Status:  Discontinued     500 mg 100 mL/hr over 60 Minutes Intravenous Every 12 hours 05/15/19 1348 05/17/19 1337   05/15/19 1300  vancomycin (VANCOCIN) IVPB 1000 mg/200 mL premix     1,000 mg 200 mL/hr over 60 Minutes Intravenous  Once 05/15/19 1250 05/15/19 2003   05/14/19 2200  cefTRIAXone (ROCEPHIN) 1 g in sodium chloride 0.9 % 100  mL IVPB  Status:  Discontinued     1 g 200 mL/hr over 30 Minutes Intravenous Every 24 hours 05/13/19 2227 05/15/19 1152   05/13/19 2200  cefTRIAXone (ROCEPHIN) 2 g in sodium chloride 0.9 % 100 mL IVPB     2 g 200 mL/hr over 30 Minutes Intravenous  Once 05/13/19 2150 05/14/19 0014         Medications  Scheduled Meds: . amLODipine  5 mg Oral Daily  . atorvastatin  20 mg Oral q1800  . escitalopram  5 mg Oral Daily  . feeding supplement (GLUCERNA SHAKE)  237 mL Oral BID BM  . feeding supplement (PRO-STAT SUGAR FREE 64)  30 mL Oral BID  . folic acid  1 mg Oral Daily  . influenza vac split quadrivalent PF  0.5 mL Intramuscular Tomorrow-1000  . insulin aspart  0-5 Units Subcutaneous QHS  . insulin aspart  0-9 Units Subcutaneous TID WC  . insulin aspart  4 Units Subcutaneous TID WC  . insulin glargine  5 Units Subcutaneous BID  . lipase/protease/amylase  12,000 Units Oral TID WC  . lisinopril  10 mg Oral Daily  . magnesium oxide  400 mg Oral BID  . multivitamin with minerals  1 tablet Oral Daily  . nicotine  14 mg Transdermal Daily  . thiamine  100 mg Oral Daily   Continuous Infusions: . sodium chloride 250 mL (06/15/19 1509)  .  ceFAZolin (ANCEF) IV 2 g (06/17/19 0606)   PRN Meds:.sodium chloride, acetaminophen **OR** acetaminophen, hydrALAZINE, Muscle Rub, oxyCODONE-acetaminophen      Subjective:   Rodney Barber was seen and examined today.  No complaints, watching TV.  No neck pain, any focal weakness.  No fevers nausea vomiting.    Objective:   Vitals:   06/16/19 0625 06/16/19 1245 06/16/19 2142 06/17/19 0650  BP: 137/79 133/77 (!) 147/82 (!) 145/76  Pulse: 68 72 76 73  Resp: 20  20 20   Temp: 97.7 F (36.5 C) 97.7 F (36.5 C) 98.9 F (37.2 C) 98 F (36.7 C)  TempSrc:  Oral Oral Oral  SpO2: 100% 99% 96% 100%  Weight:      Height:       No intake or output data in the 24 hours ending 06/17/19 1246   Wt Readings from Last 3 Encounters:  06/02/19 51.7 kg   02/22/19 49.3 kg  12/04/18 59 kg   Physical Exam  General: Alert and oriented x 3, NAD  Eyes:   HEENT:   Cardiovascular: S1 S2 clear,  RRR. No pedal edema b/l  Respiratory: CTAB, no wheezing, rales or rhonchi  Gastrointestinal: Soft, nontender, nondistended, NBS  Ext: no pedal edema bilaterally  Neuro: no new deficits  Musculoskeletal: No cyanosis, clubbing  Skin: No rashes  Psych: Normal affect and demeanor, alert and oriented x3  Data Reviewed:  I have personally reviewed following labs and imaging studies  Micro Results No results found for this or any previous visit (from the past 240 hour(s)).  Radiology Reports No results found.  Lab Data:  CBC: Recent Labs  Lab 06/17/19 0655  WBC 6.1  HGB 8.3*  HCT 27.4*  MCV 100.0  PLT 154   Basic Metabolic Panel: Recent Labs  Lab 06/14/19 0429 06/17/19 0655  NA 140 138  K 4.7 3.9  CL 109 109  CO2 22 22  GLUCOSE 213* 156*  BUN 28* 31*  CREATININE 0.73 0.67  CALCIUM 8.8* 8.8*  MG 1.9  --   PHOS 4.8*  --    GFR: Estimated Creatinine Clearance: 77.2 mL/min (by C-G formula based on SCr of 0.67 mg/dL). Liver Function Tests: Recent Labs  Lab 06/14/19 0429  AST 49*  ALT 17  ALKPHOS 85  BILITOT 0.4  PROT 6.0*  ALBUMIN 3.2*   No results for input(s): LIPASE, AMYLASE in the last 168 hours. No results for input(s): AMMONIA in the last 168 hours. Coagulation Profile: No results for input(s): INR, PROTIME in the last 168 hours. Cardiac Enzymes: No results for input(s): CKTOTAL, CKMB, CKMBINDEX, TROPONINI in the last 168 hours. BNP (last 3 results) No results for input(s): PROBNP in the last 8760 hours. HbA1C: No results for input(s): HGBA1C in the last 72 hours. CBG: Recent Labs  Lab 06/16/19 1147 06/16/19 1657 06/16/19 2140 06/17/19 0804 06/17/19 1210  GLUCAP 172* 294* 142* 147* 158*   Lipid Profile: No results for input(s): CHOL, HDL, LDLCALC, TRIG, CHOLHDL, LDLDIRECT in the last 72  hours. Thyroid Function Tests: No results for input(s): TSH, T4TOTAL, FREET4, T3FREE, THYROIDAB in the last 72 hours. Anemia Panel: No results for input(s): VITAMINB12, FOLATE, FERRITIN, TIBC, IRON, RETICCTPCT in the last 72 hours. Urine analysis:    Component Value Date/Time   COLORURINE STRAW (A) 05/13/2019 2028   APPEARANCEUR HAZY (A) 05/13/2019 2028   LABSPEC 1.024 05/13/2019 2028   PHURINE 6.0 05/13/2019 2028   GLUCOSEU >=500 (A) 05/13/2019 2028   HGBUR SMALL (A) 05/13/2019 2028   BILIRUBINUR NEGATIVE 05/13/2019 2028   BILIRUBINUR negative 03/15/2018 1049   BILIRUBINUR negative 12/26/2017 0941   KETONESUR NEGATIVE 05/13/2019 2028   PROTEINUR 30 (A) 05/13/2019 2028   UROBILINOGEN 0.2 03/15/2018 1049   UROBILINOGEN 0.2 12/08/2010 1613   NITRITE POSITIVE (A) 05/13/2019 2028   LEUKOCYTESUR LARGE (A) 05/13/2019 2028     Clearance Chenault M.D. [facility] 06/17/2019, 12:46 PM  Pager: [phone] Between 7am to 7pm - call Pager - [phone]  After 7pm go to [email or web address] - password [email or web address]  Call night coverage person covering after 7pm

## 2019-06-18 LAB — CBC
HCT: 28.4 % — ABNORMAL LOW (ref 39.0–52.0)
Hemoglobin: 8.7 g/dL — ABNORMAL LOW (ref 13.0–17.0)
MCH: 31 pg (ref 26.0–34.0)
MCHC: 30.6 g/dL (ref 30.0–36.0)
MCV: 101.1 fL — ABNORMAL HIGH (ref 80.0–100.0)
Platelets: 149 10*3/uL — ABNORMAL LOW (ref 150–400)
RBC: 2.81 MIL/uL — ABNORMAL LOW (ref 4.22–5.81)
RDW: 14 % (ref 11.5–15.5)
WBC: 6.5 10*3/uL (ref 4.0–10.5)
nRBC: 0 % (ref 0.0–0.2)

## 2019-06-18 LAB — GLUCOSE, CAPILLARY
Glucose-Capillary: 137 mg/dL — ABNORMAL HIGH (ref 70–99)
Glucose-Capillary: 139 mg/dL — ABNORMAL HIGH (ref 70–99)
Glucose-Capillary: 234 mg/dL — ABNORMAL HIGH (ref 70–99)
Glucose-Capillary: 153 mg/dL — ABNORMAL HIGH (ref 70–99)

## 2019-06-18 LAB — BASIC METABOLIC PANEL
Anion gap: 8 (ref 5–15)
BUN: 34 mg/dL — ABNORMAL HIGH (ref 6–20)
CO2: 21 mmol/L — ABNORMAL LOW (ref 22–32)
Calcium: 9.1 mg/dL (ref 8.9–10.3)
Chloride: 110 mmol/L (ref 98–111)
Creatinine, Ser: 0.87 mg/dL (ref 0.61–1.24)
GFR calc Af Amer: >60 mL/min (ref >60)
GFR calc non Af Amer: >60 mL/min (ref >60)
Glucose, Bld: 177 mg/dL — ABNORMAL HIGH (ref 70–99)
Potassium: 4.3 mmol/L (ref 3.5–5.1)
Sodium: 139 mmol/L (ref 135–145)

## 2019-06-18 NOTE — TOC Progression Note (Signed)
Transition of Care [facility]) - Progression Note    Patient Details  Name: Rodney Barber MRN: [id number] Date of Birth: 1966-01-23  [facility] CM/SW Contact  Deshauna Cayson, Juliann Pulse, RN Phone Number: 06/18/2019, 2:12 PM  Clinical Narrative: Will fax out to SN[facility] will review may have to go out of [place]-will monitor-Patient in agreement.Patient is homeless. Has a contact person brother Merry Proud [phone].      Expected Discharge Plan: Skilled Nursing Facility Barriers to Discharge: Continued Medical Work up  Expected Discharge Plan and Services Expected Discharge Plan: [place]   Discharge Planning Services: CM Consult                                           Social Determinants of Health (SDOH) Interventions    Readmission Risk Interventions Readmission Risk Prevention Plan 02/26/2019 02/22/2019  Transportation Screening Complete Complete  Medication Review Press photographer) Complete Complete  PCP or Specialist appointment within 3-5 days of discharge Complete Complete  HRI or Home Care Consult Complete Complete  SW Recovery Care/Counseling Consult Complete Complete  Palliative Care Screening Complete Complete  [place] - Not Applicable  Some recent data might be hidden

## 2019-06-18 NOTE — [place] FL2 (Signed)
[place] MEDICAID FL2 LEVEL OF CARE SCREENING TOOL     IDENTIFICATION  Patient Name: Rodney Barber Birthdate: [birth year] Sex: male Admission Date (Current Location): 05/13/2019  [facility] and [place] Number:  Producer, television/film/video and Address:  [facility],  [address], [place] [address]      Provider Number: [id number]  Attending Physician Name and Address:  Lorin Glass, MD  Relative Name and Phone Number:  Arris Meyn [phone]    Current Level of Care: Hospital Recommended Level of Care: Skilled Nursing Facility Prior Approval Number:    Date Approved/Denied:   PASRR Number: [id number]  Discharge Plan: SNF    Current Diagnoses: Patient Active Problem List   Diagnosis Date Noted  . UTI (urinary tract infection) 05/14/2019  . Hyperglycemia 05/14/2019  . Tobacco use 05/14/2019  . Abnormal blood electrolyte level 05/14/2019  . Suicide attempt (HCC) 02/26/2019  . Acetaminophen toxicity   . Goals of care, counseling/discussion   . Palliative care by specialist   . Shock liver 02/18/2019  . Hyperosmolar non-ketotic state in patient with type 2 diabetes mellitus (HCC) 02/18/2019  . Alcohol use 02/18/2019  . Substance use disorder 02/18/2019  . AKI (acute kidney injury) (HCC)   . Altered behavior   . Altered mental status 12/02/2018  . Abnormal liver function 12/02/2018  . AMS (altered mental status) 12/02/2018  . Cough 12/02/2018  . Uncontrolled type 2 diabetes mellitus (HCC) 03/15/2018  . Urine test positive for microalbuminuria 07/12/2017  . Type 2 diabetes mellitus with other specified complication (HCC) 09/08/2013  . Liver lesion 07/22/2013  . 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 Diabetes (HCC) 07/18/2013  . Hypokalemia 07/11/2013  . Protein-calorie malnutrition, severe (HCC) 07/11/2013  . Chronic pancreatitis (HCC) 07/10/2013  . 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 Diabetes mellitus (HCC)  07/10/2013  . Weight loss 07/10/2013  . Diarrhea 07/10/2013  . Liver lesion, right lobe 07/10/2013    Orientation RESPIRATION BLADDER Height & Weight     Self, Time, Situation, Place  Normal Continent Weight: 51.7 kg Height:  5\' 8"  (172.7 cm)  BEHAVIORAL SYMPTOMS/MOOD NEUROLOGICAL BOWEL NUTRITION STATUS  (depression-lexapro;ivda-crack/cocaine/smoking)   Continent Diet(CHO Mod diet)  AMBULATORY STATUS COMMUNICATION OF NEEDS Skin   Independent Verbally Normal                       Personal Care Assistance Level of Assistance  Bathing, Feeding, Dressing Bathing Assistance: Independent Feeding assistance: Independent Dressing Assistance: Independent     Functional Limitations Info  Sight, Hearing, Speech Sight Info: Adequate Hearing Info: Adequate Speech Info: Adequate    SPECIAL CARE FACTORS FREQUENCY                       Contractures Contractures Info: Not present    Additional Factors Info  Code Status, Insulin Sliding Scale Code Status Info: DNR     Insulin Sliding Scale Info: SSI       Current Medications (06/18/2019):  This is the current hospital active medication list Current Facility-Administered Medications  Medication Dose Route Frequency Provider Last Rate Last Dose  . 0.9 %  sodium chloride infusion   Intravenous PRN Rai, Ripudeep K, MD 10 mL/hr at 06/18/19 0600    . acetaminophen (TYLENOL) tablet 650 mg  650 mg Oral Q6H PRN 13/11/20, MD   650 mg at 06/16/19 2010   Or  . acetaminophen (TYLENOL) suppository 650 mg  650 mg Rectal Q6H PRN [name], MD      . amLODipine (NORVASC) tablet 5 mg  5 mg Oral Daily Hilty, Nadean Corwin, MD   5 mg at 06/18/19 0901  . atorvastatin (LIPITOR) tablet 20 mg  20 mg Oral q1800 [name], MD   20 mg at 06/17/19 1805  . ceFAZolin (ANCEF) IVPB 2g/100 mL premix  2 g Intravenous Q8H Hilty, Nadean Corwin, MD   Stopped at 06/18/19 0550  . escitalopram (LEXAPRO) tablet 5 mg  5 mg Oral Daily Hilty,  Nadean Corwin, MD   5 mg at 06/18/19 0900  . feeding supplement (GLUCERNA SHAKE) (GLUCERNA SHAKE) liquid 237 mL  237 mL Oral BID BM Barb Merino, MD   237 mL at 06/17/19 2014  . feeding supplement (PRO-STAT SUGAR FREE 64) liquid 30 mL  30 mL Oral BID Barb Merino, MD   30 mL at 06/18/19 0902  . folic acid (FOLVITE) tablet 1 mg  1 mg Oral Daily Hilty, Nadean Corwin, MD   1 mg at 06/18/19 0902  . hydrALAZINE (APRESOLINE) injection 5 mg  5 mg Intravenous Q4H PRN Hilty, Nadean Corwin, MD      . influenza vac split quadrivalent PF (FLUARIX) injection 0.5 mL  0.5 mL Intramuscular Tomorrow-1000 Shela Leff, MD      . insulin aspart (novoLOG) injection 0-5 Units  0-5 Units Subcutaneous QHS [name], MD   3 Units at 06/15/19 2200  . insulin aspart (novoLOG) injection 0-9 Units  0-9 Units Subcutaneous TID WC [name], MD   1 Units at 06/18/19 1222  . insulin aspart (novoLOG) injection 4 Units  4 Units Subcutaneous TID WC Guilford Shi, MD   4 Units at 06/18/19 1222  . insulin glargine (LANTUS) injection 5 Units  5 Units Subcutaneous BID Rai, Ripudeep K, MD   5 Units at 06/18/19 0902  . lipase/protease/amylase (CREON) capsule 12,000 Units  12,000 Units Oral TID WC [name], MD   12,000 Units at 06/18/19 1222  . lisinopril (ZESTRIL) tablet 10 mg  10 mg Oral Daily [name], MD   10 mg at 06/18/19 0901  . magnesium oxide (MAG-OX) tablet 400 mg  400 mg Oral BID [name], MD   400 mg at 06/18/19 0900  . multivitamin with minerals tablet 1 tablet  1 tablet Oral Daily Hilty, Nadean Corwin, MD   1 tablet at 06/18/19 0900  . Muscle Rub CREA   Topical PRN Hilty, Nadean Corwin, MD      . nicotine (NICODERM CQ - dosed in mg/24 hours) patch 14 mg  14 mg Transdermal Daily [name], MD   14 mg at 06/18/19 0903  . oxyCODONE-acetaminophen (PERCOCET/ROXICET) 5-325 MG per tablet 1 tablet  1 tablet Oral Q6H PRN [name], MD   1 tablet at 06/11/19 2241  . thiamine (VITAMIN B-1)  tablet 100 mg  100 mg Oral Daily Hilty, Nadean Corwin, MD   100 mg at 06/18/19 0900     Discharge Medications: Please see discharge summary for a list of discharge medications.  Relevant Imaging Results:  Relevant Lab Results:   Additional Information iv abx ancep 2gm iv q8h till 06/25/19 for a total of 6 weeks of abx.  Dessa Phi, RN

## 2019-06-18 NOTE — NC FL2 (Signed)
[place] LEVEL OF CARE SCREENING TOOL     IDENTIFICATION  Patient Name: Rodney Barber Birthdate: Sep 14, 1965 Sex: male Admission Date (Current Location): 05/13/2019  [facility] and [place] Number:  Herbalist and Address:  [facility],  [place] [address] [place]      Provider Number: [id number]  Attending Physician Name and Address:  Terrilee Croak, MD  Relative Name and Phone Number:  Clifford Benninger [phone]    Current Level of Care: Hospital Recommended Level of Care: [place] Prior Approval Number:    Date Approved/Denied:   PASRR Number: [id number]  Discharge Plan: SNF    Current Diagnoses: Patient Active Problem List   Diagnosis Date Noted  . UTI (urinary tract infection) 05/14/2019  . Hyperglycemia 05/14/2019  . Tobacco use 05/14/2019  . Abnormal blood electrolyte level 05/14/2019  . Suicide attempt ([place]) 02/26/2019  . Acetaminophen toxicity   . Goals of care, counseling/discussion   . Palliative care by specialist   . Shock liver 02/18/2019  . Hyperosmolar non-ketotic state in patient with type 2 diabetes mellitus ([place]) 02/18/2019  . Alcohol use 02/18/2019  . Substance use disorder 02/18/2019  . AKI (acute kidney injury) ([place])   . Altered behavior   . Altered mental status 12/02/2018  . Abnormal liver function 12/02/2018  . AMS (altered mental status) 12/02/2018  . Cough 12/02/2018  . Uncontrolled type 2 diabetes mellitus ([place]) 03/15/2018  . Urine test positive for microalbuminuria 07/12/2017  . Type 2 diabetes mellitus with other specified complication ([place]) 96/28/3662  . Liver lesion 07/22/2013  . Alcohol-induced chronic pancreatitis ([place]) 07/22/2013  . Diabetes ([place]) 07/18/2013  . Hypokalemia 07/11/2013  . Protein-calorie malnutrition, severe ([employer]) 07/11/2013  . Chronic pancreatitis ([place]) 07/10/2013  . 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 Diabetes mellitus ([place])  07/10/2013  . Weight loss 07/10/2013  . Diarrhea 07/10/2013  . Liver lesion, right lobe 07/10/2013    Orientation RESPIRATION BLADDER Height & Weight     Self, Time, Situation, Place  Normal Continent Weight: 51.7 kg Height:  5\' 8"  (172.7 cm)  BEHAVIORAL SYMPTOMS/MOOD NEUROLOGICAL BOWEL NUTRITION STATUS      Continent Diet(CHO Mod diet)  AMBULATORY STATUS COMMUNICATION OF NEEDS Skin   Independent Verbally Normal                       Personal Care Assistance Level of Assistance  Bathing, Feeding, Dressing Bathing Assistance: Independent Feeding assistance: Independent Dressing Assistance: Independent     Functional Limitations Info  Sight, Hearing, Speech Sight Info: Adequate Hearing Info: Adequate Speech Info: Adequate    SPECIAL CARE FACTORS FREQUENCY                       Contractures Contractures Info: Not present    Additional Factors Info  Code Status, Insulin Sliding Scale Code Status Info: DNR     Insulin Sliding Scale Info: SSI       Current Medications (06/18/2019):  This is the current hospital active medication list Current Facility-Administered Medications  Medication Dose Route Frequency Provider Last Rate Last Dose  . 0.9 %  sodium chloride infusion   Intravenous PRN Rai, Ripudeep K, MD 10 mL/hr at 06/18/19 0600    . acetaminophen (TYLENOL) tablet 650 mg  650 mg Oral Q6H PRN [name], MD   650 mg at 06/16/19 2010   Or  . acetaminophen (TYLENOL) suppository 650 mg  650 mg Rectal Q6H PRN Chrystie Nose, MD      . amLODipine (NORVASC) tablet 5 mg  5 mg Oral Daily Hilty, Lisette Abu, MD   5 mg at 06/18/19 0901  . atorvastatin (LIPITOR) tablet 20 mg  20 mg Oral q1800 Chrystie Nose, MD   20 mg at 06/17/19 1805  . ceFAZolin (ANCEF) IVPB 2g/100 mL premix  2 g Intravenous Q8H Hilty, Lisette Abu, MD   Stopped at 06/18/19 0550  . escitalopram (LEXAPRO) tablet 5 mg  5 mg Oral Daily Hilty, Lisette Abu, MD   5 mg at 06/18/19 0900  . feeding  supplement (GLUCERNA SHAKE) (GLUCERNA SHAKE) liquid 237 mL  237 mL Oral BID BM Dorcas Carrow, MD   237 mL at 06/17/19 2014  . feeding supplement (PRO-STAT SUGAR FREE 64) liquid 30 mL  30 mL Oral BID Dorcas Carrow, MD   30 mL at 06/18/19 0902  . folic acid (FOLVITE) tablet 1 mg  1 mg Oral Daily Hilty, Lisette Abu, MD   1 mg at 06/18/19 0902  . hydrALAZINE (APRESOLINE) injection 5 mg  5 mg Intravenous Q4H PRN Hilty, Lisette Abu, MD      . influenza vac split quadrivalent PF (FLUARIX) injection 0.5 mL  0.5 mL Intramuscular Tomorrow-1000 [name], MD      . insulin aspart (novoLOG) injection 0-5 Units  0-5 Units Subcutaneous QHS Chrystie Nose, MD   3 Units at 06/15/19 2200  . insulin aspart (novoLOG) injection 0-9 Units  0-9 Units Subcutaneous TID WC Chrystie Nose, MD   1 Units at 06/18/19 1222  . insulin aspart (novoLOG) injection 4 Units  4 Units Subcutaneous TID WC Alessandra Bevels, MD   4 Units at 06/18/19 1222  . insulin glargine (LANTUS) injection 5 Units  5 Units Subcutaneous BID Rai, Ripudeep K, MD   5 Units at 06/18/19 0902  . lipase/protease/amylase (CREON) capsule 12,000 Units  12,000 Units Oral TID WC Chrystie Nose, MD   12,000 Units at 06/18/19 1222  . lisinopril (ZESTRIL) tablet 10 mg  10 mg Oral Daily Chrystie Nose, MD   10 mg at 06/18/19 0901  . magnesium oxide (MAG-OX) tablet 400 mg  400 mg Oral BID Chrystie Nose, MD   400 mg at 06/18/19 0900  . multivitamin with minerals tablet 1 tablet  1 tablet Oral Daily Hilty, Lisette Abu, MD   1 tablet at 06/18/19 0900  . Muscle Rub CREA   Topical PRN Hilty, Lisette Abu, MD      . nicotine (NICODERM CQ - dosed in mg/24 hours) patch 14 mg  14 mg Transdermal Daily Chrystie Nose, MD   14 mg at 06/18/19 0903  . oxyCODONE-acetaminophen (PERCOCET/ROXICET) 5-325 MG per tablet 1 tablet  1 tablet Oral Q6H PRN Chrystie Nose, MD   1 tablet at 06/11/19 2241  . thiamine (VITAMIN B-1) tablet 100 mg  100 mg Oral Daily Hilty, Lisette Abu,  MD   100 mg at 06/18/19 0900     Discharge Medications: Please see discharge summary for a list of discharge medications.  Relevant Imaging Results:  Relevant Lab Results:   Additional Information SS#[phone], Olegario Messier, [place]

## 2019-06-18 NOTE — Progress Notes (Signed)
PROGRESS NOTE  Rodney Barber  DOB: 02-23-1966  PCP: [name], NP EXH:[id number]  DOA: 05/13/2019  LOS: 35 days   Chief Complaint  Patient presents with  . Hyperglycemia  . Weakness   Brief narrative: 53 year old male with history of insulin-dependent diabetes, hypertension, chronic pancreatitis, smoker, recent prolonged admission in the hospital complicated with initial suicidal ideation later cleared, deconditioning, now homeless. 10/6, patient called EMS for extreme weakness and leg cramps.  He had run out of insulin for more than a week. EMS found his blood sugars to be over 500 and brought him to the ED. In the ED, blood pressure was elevated, potassium was less than 2.EKG showed U waves.   Patient was admitted to the hospital with hyperglycemia, severe hypokalemia as well UTI like presentation.  Urine culture obtained on admission grew MSSA. Culture taken from neck abscess also grew MSSA.  Blood culture negative. 10/8, CT scanning soft tissue neck showed multiloculated abscess at C1-C4 level as well as osteomyelitis in the lamina of C3-C4 level Per ID recommendation, antibiotic was switched to IV Ancef. Remains in the hospital to finish antibiotics, until 06/25/2019 due to history of IV drug abuse, unsafe to discharge with PICC line  Subjective: Patient was seen and examined this morning.  Middle-aged African-American male.  Lying down in bed.  Not in distress.  No new symptoms.  Assessment/Plan: MSSA UTI, MSSA left neck abscess, C-spine osteomyelitis, MSSA bacteremia -Urine culture with MSSA, left neck aspirated with MSSA, blood cultures 10/9 had shown MSSA -TEE showed no vegetations -Continue IV Ancef for 6 weeks -Per ID, continue cefazolin until 11/18, reconsult ID before the discharge date to assess if patient needs continued oral antibiotics. -Currently no complaints  Electrolyte abnormalities including profound hypokalemia, hypophosphatemia admission -At  the time of admission potassium less than 2.0, phosphorus less than 1.0 -Electrolytes within normal limits  Diabetes mellitus, brittle, uncontrolled IDDM with hypoglycemia and hyperglycemia -Hemoglobin A1c in 02/2019 was 17.7, repeat hemoglobin A1c 14.3 -Brittle diabetes with episodes of hyper and hypoglycemia.  -CBGs better, for now continue current regimen Lantus 5 units twice daily, meal coverage and sliding scale insulin   Essential hypertension BP stable, continue Norvasc, lisinopril, hydralazine IV as needed  History of depression Continue Lexapro, no SI  History of smoking Continue nicotine patch  Homeless Case management assisting  Protein calorie malnutrition, moderate to severe -BMI 17.3, registered dietitian following  Body mass index is 17.32 kg/m. Mobility: Encourage ambulation DVT prophylaxis:  SCDs Code Status:   Code Status: DNR  Family Communication:  Expected Discharge:  To remain on IV antibiotics till 11/18.  Consultants:  ID  Procedures:  None  Antimicrobials: Anti-infectives (From admission, onward)   Start     Dose/Rate Route Frequency Ordered Stop   05/17/19 1400  ceFAZolin (ANCEF) IVPB 2g/100 mL premix     2 g 200 mL/hr over 30 Minutes Intravenous Every 8 hours 05/17/19 1353     05/16/19 0200  vancomycin (VANCOCIN) 500 mg in sodium chloride 0.9 % 100 mL IVPB  Status:  Discontinued     500 mg 100 mL/hr over 60 Minutes Intravenous Every 12 hours 05/15/19 1348 05/17/19 1337   05/15/19 1300  vancomycin (VANCOCIN) IVPB 1000 mg/200 mL premix     1,000 mg 200 mL/hr over 60 Minutes Intravenous  Once 05/15/19 1250 05/15/19 2003   05/14/19 2200  cefTRIAXone (ROCEPHIN) 1 g in sodium chloride 0.9 % 100 mL IVPB  Status:  Discontinued     1  g 200 mL/hr over 30 Minutes Intravenous Every 24 hours 05/13/19 2227 05/15/19 1152   05/13/19 2200  cefTRIAXone (ROCEPHIN) 2 g in sodium chloride 0.9 % 100 mL IVPB     2 g 200 mL/hr over 30 Minutes  Intravenous  Once 05/13/19 2150 05/14/19 0014      Diet Order            Diet Carb Modified Fluid consistency: Thin; Room service appropriate? Yes  Diet effective now              Infusions:  . sodium chloride 10 mL/hr at 06/18/19 0600  .  ceFAZolin (ANCEF) IV 2 g (06/18/19 1457)    Scheduled Meds: . amLODipine  5 mg Oral Daily  . atorvastatin  20 mg Oral q1800  . escitalopram  5 mg Oral Daily  . feeding supplement (GLUCERNA SHAKE)  237 mL Oral BID BM  . feeding supplement (PRO-STAT SUGAR FREE 64)  30 mL Oral BID  . folic acid  1 mg Oral Daily  . influenza vac split quadrivalent PF  0.5 mL Intramuscular Tomorrow-1000  . insulin aspart  0-5 Units Subcutaneous QHS  . insulin aspart  0-9 Units Subcutaneous TID WC  . insulin aspart  4 Units Subcutaneous TID WC  . insulin glargine  5 Units Subcutaneous BID  . lipase/protease/amylase  12,000 Units Oral TID WC  . lisinopril  10 mg Oral Daily  . magnesium oxide  400 mg Oral BID  . multivitamin with minerals  1 tablet Oral Daily  . nicotine  14 mg Transdermal Daily  . thiamine  100 mg Oral Daily    PRN meds: sodium chloride, acetaminophen **OR** acetaminophen, hydrALAZINE, Muscle Rub, oxyCODONE-acetaminophen   Objective: Vitals:   06/18/19 0707 06/18/19 1302  BP: (!) 141/78 134/76  Pulse: 71 80  Resp: 18 20  Temp: 98 F (36.7 C) 97.9 F (36.6 C)  SpO2: 100% 97%    Intake/Output Summary (Last 24 hours) at 06/18/2019 1647 Last data filed at 06/18/2019 1508 Gross per 24 hour  Intake 2676.54 ml  Output -  Net 2676.54 ml   Filed Weights   05/14/19 0240 05/20/19 1221 06/02/19 1916  Weight: 46.4 kg 46.4 kg 51.7 kg   Weight change:  Body mass index is 17.32 kg/m.   Physical Exam: General exam: Appears calm and comfortable.  Skin: No rashes, lesions or ulcers. HEENT: Atraumatic, normocephalic, supple neck, no obvious bleeding Lungs: Clear to auscultation bilaterally CVS: Regular rate and rhythm, no murmur  GI/Abd soft, nontender, nondistended, bowel present CNS: Alert, awake, oriented x3 Psychiatry: Mood appropriate Extremities: No pedal edema, no calf tenderness  Data Review: I have personally reviewed the laboratory data and studies available.  Recent Labs  Lab 06/17/19 0655 06/18/19 0518  WBC 6.1 6.5  HGB 8.3* 8.7*  HCT 27.4* 28.4*  MCV 100.0 101.1*  PLT 154 149*   Recent Labs  Lab 06/14/19 0429 06/17/19 0655 06/18/19 0518  NA 140 138 139  K 4.7 3.9 4.3  CL 109 109 110  CO2 22 22 21*  GLUCOSE 213* 156* 177*  BUN 28* 31* 34*  CREATININE 0.73 0.67 0.87  CALCIUM 8.8* 8.8* 9.1  MG 1.9  --   --   PHOS 4.8*  --   --     Terrilee Croak, MD  [facility] 06/18/2019

## 2019-06-19 LAB — GLUCOSE, CAPILLARY
Glucose-Capillary: 125 mg/dL — ABNORMAL HIGH (ref 70–99)
Glucose-Capillary: 106 mg/dL — ABNORMAL HIGH (ref 70–99)
Glucose-Capillary: 231 mg/dL — ABNORMAL HIGH (ref 70–99)
Glucose-Capillary: 206 mg/dL — ABNORMAL HIGH (ref 70–99)

## 2019-06-19 NOTE — TOC Progression Note (Signed)
Transition of Care [facility]) - Progression Note    Patient Details  Name: Javoni Lucken MRN: [id number] Date of Birth: 01/04/1966  [facility] CM/SW Contact  Dantrell Schertzer, Juliann Pulse, RN Phone Number: 06/19/2019, 10:53 AM  Clinical Narrative:  Messaged the rep for [facility] rep Bryson Ha for an update-await response.     Expected Discharge Plan: Skilled Nursing Facility Barriers to Discharge: Continued Medical Work up  Expected Discharge Plan and Services Expected Discharge Plan: [place]   Discharge Planning Services: CM Consult                                           Social Determinants of Health (SDOH) Interventions    Readmission Risk Interventions Readmission Risk Prevention Plan 02/26/2019 02/22/2019  Transportation Screening Complete Complete  Medication Review Press photographer) Complete Complete  PCP or Specialist appointment within 3-5 days of discharge Complete Complete  HRI or Home Care Consult Complete Complete  SW Recovery Care/Counseling Consult Complete Complete  Palliative Care Screening Complete Complete  [place] - Not Applicable  Some recent data might be hidden

## 2019-06-19 NOTE — Progress Notes (Signed)
PROGRESS NOTE  Rodney Barber  DOB: [birth year]  PCP: Gildardo Pounds, NP YQM:[id number]  DOA: 05/13/2019  LOS: 36 days   Chief Complaint  Patient presents with  . Hyperglycemia  . Weakness   Brief narrative: 53 year old male with history of insulin-dependent diabetes, hypertension, chronic pancreatitis, smoker, recent prolonged admission in the hospital complicated with initial suicidal ideation later cleared, deconditioning, now homeless. 10/6, patient called EMS for extreme weakness and leg cramps.  He had run out of insulin for more than a week. EMS found his blood sugars to be over 500 and brought him to the ED. In the ED, blood pressure was elevated, potassium was less than 2.EKG showed U waves.   Patient was admitted to the hospital with hyperglycemia, severe hypokalemia as well UTI like presentation.  Urine culture obtained on admission grew MSSA. Culture taken from neck abscess also grew MSSA.  Blood culture negative. 10/8, CT scanning soft tissue neck showed multiloculated abscess at C1-C4 level as well as osteomyelitis in the lamina of C3-C4 level Per ID recommendation, antibiotic was switched to IV Ancef. Remains in the hospital to finish antibiotics, until 06/25/2019 due to history of IV drug abuse, unsafe to discharge with PICC line.  Subjective: Patient was seen and examined this morning.  Middle-aged African-American male.  Lying down in bed.  Not in distress. No new symptoms.  Assessment/Plan: MSSA UTI, MSSA left neck abscess, C-spine osteomyelitis, MSSA bacteremia -Urine culture with MSSA, left neck aspirated with MSSA, blood cultures 10/9 had shown MSSA -TEE showed no vegetations -Continue IV Ancef for 6 weeks -Per ID, continue cefazolin until 11/18, reconsult ID before the discharge date to assess if patient needs continued oral antibiotics. -Currently no complaints  Electrolyte abnormalities including profound hypokalemia, hypophosphatemia admission -At  the time of admission potassium less than 2.0, phosphorus less than 1.0 -Electrolytes within normal limits  Diabetes mellitus, brittle, uncontrolled IDDM with hypoglycemia and hyperglycemia -Hemoglobin A1c in 02/2019 was 17.7, repeat hemoglobin A1c 14.3 -Brittle diabetes with episodes of hyper and hypoglycemia.  -CBGs better, for now continue current regimen Lantus 5 units twice daily, meal coverage and sliding scale insulin   Essential hypertension BP stable, continue Norvasc, lisinopril, hydralazine IV as needed  History of depression Continue Lexapro, no SI  History of smoking Continue nicotine patch  Homeless Case management assisting  Protein calorie malnutrition, moderate to severe -BMI 17.3, registered dietitian following  Body mass index is 17.32 kg/m. Mobility: Encourage ambulation DVT prophylaxis:  SCDs Code Status:   Code Status: DNR  Family Communication:  Expected Discharge:  To remain on IV antibiotics till 11/18.  Consultants:  ID  Procedures:  None  Antimicrobials: Anti-infectives (From admission, onward)   Start     Dose/Rate Route Frequency Ordered Stop   05/17/19 1400  ceFAZolin (ANCEF) IVPB 2g/100 mL premix     2 g 200 mL/hr over 30 Minutes Intravenous Every 8 hours 05/17/19 1353     05/16/19 0200  vancomycin (VANCOCIN) 500 mg in sodium chloride 0.9 % 100 mL IVPB  Status:  Discontinued     500 mg 100 mL/hr over 60 Minutes Intravenous Every 12 hours 05/15/19 1348 05/17/19 1337   05/15/19 1300  vancomycin (VANCOCIN) IVPB 1000 mg/200 mL premix     1,000 mg 200 mL/hr over 60 Minutes Intravenous  Once 05/15/19 1250 05/15/19 2003   05/14/19 2200  cefTRIAXone (ROCEPHIN) 1 g in sodium chloride 0.9 % 100 mL IVPB  Status:  Discontinued     1 g  200 mL/hr over 30 Minutes Intravenous Every 24 hours 05/13/19 2227 05/15/19 1152   05/13/19 2200  cefTRIAXone (ROCEPHIN) 2 g in sodium chloride 0.9 % 100 mL IVPB     2 g 200 mL/hr over 30 Minutes  Intravenous  Once 05/13/19 2150 05/14/19 0014      Diet Order            Diet Carb Modified Fluid consistency: Thin; Room service appropriate? Yes  Diet effective now              Infusions:  . sodium chloride 10 mL/hr at 06/18/19 0600  .  ceFAZolin (ANCEF) IV 2 g (06/19/19 1440)    Scheduled Meds: . amLODipine  5 mg Oral Daily  . atorvastatin  20 mg Oral q1800  . escitalopram  5 mg Oral Daily  . feeding supplement (GLUCERNA SHAKE)  237 mL Oral BID BM  . feeding supplement (PRO-STAT SUGAR FREE 64)  30 mL Oral BID  . folic acid  1 mg Oral Daily  . influenza vac split quadrivalent PF  0.5 mL Intramuscular Tomorrow-1000  . insulin aspart  0-5 Units Subcutaneous QHS  . insulin aspart  0-9 Units Subcutaneous TID WC  . insulin aspart  4 Units Subcutaneous TID WC  . insulin glargine  5 Units Subcutaneous BID  . lipase/protease/amylase  12,000 Units Oral TID WC  . lisinopril  10 mg Oral Daily  . magnesium oxide  400 mg Oral BID  . multivitamin with minerals  1 tablet Oral Daily  . nicotine  14 mg Transdermal Daily  . thiamine  100 mg Oral Daily    PRN meds: sodium chloride, acetaminophen **OR** acetaminophen, hydrALAZINE, Muscle Rub, oxyCODONE-acetaminophen   Objective: Vitals:   06/19/19 1012 06/19/19 1311  BP: 133/78 (!) 141/77  Pulse:  74  Resp:  19  Temp:  97.9 F (36.6 C)  SpO2:  99%    Intake/Output Summary (Last 24 hours) at 06/19/2019 1641 Last data filed at 06/19/2019 1400 Gross per 24 hour  Intake 2437.96 ml  Output -  Net 2437.96 ml   Filed Weights   05/14/19 0240 05/20/19 1221 06/02/19 1916  Weight: 46.4 kg 46.4 kg 51.7 kg   Weight change:  Body mass index is 17.32 kg/m.   Physical Exam: General exam: Appears calm and comfortable.  Skin: No rashes, lesions or ulcers. HEENT: Atraumatic, normocephalic, supple neck, no obvious bleeding Lungs: Clear to auscultation bilaterally CVS: Regular rate and rhythm, no murmur GI/Abd soft, nontender,  nondistended, bowel present CNS: Alert, awake, oriented x3 Psychiatry: Mood appropriate Extremities: No pedal edema, no calf tenderness  Data Review: I have personally reviewed the laboratory data and studies available.  Recent Labs  Lab 06/17/19 0655 06/18/19 0518  WBC 6.1 6.5  HGB 8.3* 8.7*  HCT 27.4* 28.4*  MCV 100.0 101.1*  PLT 154 149*   Recent Labs  Lab 06/14/19 0429 06/17/19 0655 06/18/19 0518  NA 140 138 139  K 4.7 3.9 4.3  CL 109 109 110  CO2 22 22 21*  GLUCOSE 213* 156* 177*  BUN 28* 31* 34*  CREATININE 0.73 0.67 0.87  CALCIUM 8.8* 8.8* 9.1  MG 1.9  --   --   PHOS 4.8*  --   --     Lorin Glass, MD  [facility] 06/19/2019

## 2019-06-20 LAB — GLUCOSE, CAPILLARY
Glucose-Capillary: 196 mg/dL — ABNORMAL HIGH (ref 70–99)
Glucose-Capillary: 254 mg/dL — ABNORMAL HIGH (ref 70–99)
Glucose-Capillary: 230 mg/dL — ABNORMAL HIGH (ref 70–99)
Glucose-Capillary: 261 mg/dL — ABNORMAL HIGH (ref 70–99)

## 2019-06-20 NOTE — Plan of Care (Signed)
  Problem: Health Behavior/Discharge Planning: Goal: Ability to manage health-related needs will improve Outcome: Progressing   Problem: Clinical Measurements: Goal: Will remain free from infection Outcome: Progressing   Problem: Nutrition: Goal: Adequate nutrition will be maintained Outcome: Completed/Met   Problem: Pain Managment: Goal: General experience of comfort will improve Outcome: Completed/Met   Problem: Safety: Goal: Ability to remain free from injury will improve Outcome: Progressing

## 2019-06-20 NOTE — Progress Notes (Signed)
PROGRESS NOTE  Rodney Barber  DOB: 09/03/1965  PCP: [name], NP HWE:[id number]  DOA: 05/13/2019  LOS: 37 days   Chief Complaint  Patient presents with  . Hyperglycemia  . Weakness   Brief narrative: 53 year old male with history of insulin-dependent diabetes, hypertension, chronic pancreatitis, smoker, recent prolonged admission in the hospital complicated with initial suicidal ideation later cleared, deconditioning, now homeless. 10/6, patient called EMS for extreme weakness and leg cramps.  He had run out of insulin for more than a week. EMS found his blood sugars to be over 500 and brought him to the ED. In the ED, blood pressure was elevated, potassium was less than 2.EKG showed U waves.   Patient was admitted to the hospital with hyperglycemia, severe hypokalemia as well UTI like presentation.  Urine culture obtained on admission grew MSSA. Culture taken from neck abscess also grew MSSA.  Blood culture negative. 10/8, CT scanning soft tissue neck showed multiloculated abscess at C1-C4 level as well as osteomyelitis in the lamina of C3-C4 level Per ID recommendation, antibiotic was switched to IV Ancef. Remains in the hospital to finish antibiotics, until 06/25/2019 due to history of IV drug abuse, unsafe to discharge with PICC line.  Subjective: Patient was seen and examined this morning.  Middle-aged African-American male.  Lying down in bed.  Not in distress. No new symptoms.  Assessment/Plan: MSSA UTI, MSSA left neck abscess, C-spine osteomyelitis, MSSA bacteremia -Urine culture with MSSA, left neck aspirated with MSSA, blood cultures 10/9 had shown MSSA -TEE showed no vegetations -Continue IV Ancef for 6 weeks -Per ID, continue cefazolin until 11/18, reconsult ID before the discharge date to assess if patient needs continued oral antibiotics. -Currently no complaints  Electrolyte abnormalities including profound hypokalemia, hypophosphatemia admission -At  the time of admission potassium less than 2.0, phosphorus less than 1.0 -Electrolytes within normal limits  Diabetes mellitus, brittle, uncontrolled IDDM with hypoglycemia and hyperglycemia -Hemoglobin A1c in 02/2019 was 17.7, repeat hemoglobin A1c 14.3 -Brittle diabetes with episodes of hyper and hypoglycemia.  -CBGs better, for now continue current regimen Lantus 5 units twice daily, meal coverage and sliding scale insulin    Essential hypertension BP stable, continue Norvasc, lisinopril, hydralazine IV as needed  History of depression Continue Lexapro, no SI  History of smoking Continue nicotine patch  Homeless Case management assisting  Protein calorie malnutrition, moderate to severe -BMI 17.3, registered dietitian following  Body mass index is 17.32 kg/m. Mobility: Encourage ambulation DVT prophylaxis:  SCDs Code Status:   Code Status: DNR  Family Communication:  Expected Discharge:  To remain on IV antibiotics till 11/18.  Consultants:  ID  Procedures:  None  Antimicrobials: Anti-infectives (From admission, onward)   Start     Dose/Rate Route Frequency Ordered Stop   05/17/19 1400  ceFAZolin (ANCEF) IVPB 2g/100 mL premix     2 g 200 mL/hr over 30 Minutes Intravenous Every 8 hours 05/17/19 1353     05/16/19 0200  vancomycin (VANCOCIN) 500 mg in sodium chloride 0.9 % 100 mL IVPB  Status:  Discontinued     500 mg 100 mL/hr over 60 Minutes Intravenous Every 12 hours 05/15/19 1348 05/17/19 1337   05/15/19 1300  vancomycin (VANCOCIN) IVPB 1000 mg/200 mL premix     1,000 mg 200 mL/hr over 60 Minutes Intravenous  Once 05/15/19 1250 05/15/19 2003   05/14/19 2200  cefTRIAXone (ROCEPHIN) 1 g in sodium chloride 0.9 % 100 mL IVPB  Status:  Discontinued     1  g 200 mL/hr over 30 Minutes Intravenous Every 24 hours 05/13/19 2227 05/15/19 1152   05/13/19 2200  cefTRIAXone (ROCEPHIN) 2 g in sodium chloride 0.9 % 100 mL IVPB     2 g 200 mL/hr over 30 Minutes  Intravenous  Once 05/13/19 2150 05/14/19 0014      Diet Order            Diet Carb Modified Fluid consistency: Thin; Room service appropriate? Yes  Diet effective now              Infusions:  . sodium chloride Stopped (06/20/19 0855)  .  ceFAZolin (ANCEF) IV 2 g (06/20/19 0506)    Scheduled Meds: . amLODipine  5 mg Oral Daily  . atorvastatin  20 mg Oral q1800  . escitalopram  5 mg Oral Daily  . feeding supplement (GLUCERNA SHAKE)  237 mL Oral BID BM  . feeding supplement (PRO-STAT SUGAR FREE 64)  30 mL Oral BID  . folic acid  1 mg Oral Daily  . influenza vac split quadrivalent PF  0.5 mL Intramuscular Tomorrow-1000  . insulin aspart  0-5 Units Subcutaneous QHS  . insulin aspart  0-9 Units Subcutaneous TID WC  . insulin aspart  4 Units Subcutaneous TID WC  . insulin glargine  5 Units Subcutaneous BID  . lipase/protease/amylase  12,000 Units Oral TID WC  . lisinopril  10 mg Oral Daily  . magnesium oxide  400 mg Oral BID  . multivitamin with minerals  1 tablet Oral Daily  . nicotine  14 mg Transdermal Daily  . thiamine  100 mg Oral Daily    PRN meds: sodium chloride, acetaminophen **OR** acetaminophen, hydrALAZINE, Muscle Rub, oxyCODONE-acetaminophen   Objective: Vitals:   06/20/19 0036 06/20/19 0616  BP: (!) 152/85 (!) 151/80  Pulse: 78 78  Resp: 18 18  Temp: 98 F (36.7 C) 98.2 F (36.8 C)  SpO2: 98% 97%    Intake/Output Summary (Last 24 hours) at 06/20/2019 1107 Last data filed at 06/20/2019 0300 Gross per 24 hour  Intake 1532.59 ml  Output -  Net 1532.59 ml   Filed Weights   05/14/19 0240 05/20/19 1221 06/02/19 1916  Weight: 46.4 kg 46.4 kg 51.7 kg   Weight change:  Body mass index is 17.32 kg/m.   Physical Exam: General exam: Appears calm and comfortable.  Skin: No rashes, lesions or ulcers. HEENT: Atraumatic, normocephalic, supple neck, no obvious bleeding Lungs: Clear to auscultation bilaterally CVS: Regular rate and rhythm, no murmur  GI/Abd soft, nontender, nondistended, bowel present CNS: Alert, awake, oriented x3 Psychiatry: Mood appropriate Extremities: No pedal edema, no calf tenderness  Data Review: I have personally reviewed the laboratory data and studies available.  Recent Labs  Lab 06/17/19 0655 06/18/19 0518  WBC 6.1 6.5  HGB 8.3* 8.7*  HCT 27.4* 28.4*  MCV 100.0 101.1*  PLT 154 149*   Recent Labs  Lab 06/14/19 0429 06/17/19 0655 06/18/19 0518  NA 140 138 139  K 4.7 3.9 4.3  CL 109 109 110  CO2 22 22 21*  GLUCOSE 213* 156* 177*  BUN 28* 31* 34*  CREATININE 0.73 0.67 0.87  CALCIUM 8.8* 8.8* 9.1  MG 1.9  --   --   PHOS 4.8*  --   --     Terrilee Croak, MD  [facility] 06/20/2019

## 2019-06-21 LAB — GLUCOSE, CAPILLARY
Glucose-Capillary: 166 mg/dL — ABNORMAL HIGH (ref 70–99)
Glucose-Capillary: 239 mg/dL — ABNORMAL HIGH (ref 70–99)
Glucose-Capillary: 251 mg/dL — ABNORMAL HIGH (ref 70–99)
Glucose-Capillary: 183 mg/dL — ABNORMAL HIGH (ref 70–99)

## 2019-06-21 NOTE — Plan of Care (Signed)
  Problem: Health Behavior/Discharge Planning: Goal: Ability to manage health-related needs will improve Outcome: Progressing   Problem: Clinical Measurements: Goal: Will remain free from infection Outcome: Progressing   Problem: Safety: Goal: Ability to remain free from injury will improve Outcome: Progressing   

## 2019-06-21 NOTE — Progress Notes (Addendum)
PROGRESS NOTE  Rodney Barber QIO:[id number] DOB: Nov 11, 1965 DOA: 05/13/2019 PCP: Rodney Pounds, NP  HPI/Recap of past 24 hours: Patient seen and examined at bedside denies any problem  Assessment/Plan: Principal Problem:   Hypokalemia Active Problems:   Uncontrolled type 2 diabetes mellitus ([name])   UTI (urinary tract infection)   Hyperglycemia   Tobacco use   Abnormal blood electrolyte level  MSSA UTI, MSSA left neck abscess, C-spine osteomyelitis, MSSA bacteremia  -Urine culture had MSSA, left neck aspiration also had MSSA, blood culture on May 16, 2019 showed MSSA. TEE showed no vegetation Patient is currently on IV Ancef for 6 weeks Per ID we will continue cefazolin until November 18.  Will need to reconsult infectious disease before the discharge date to assess if patient needs continued oral antibiotic.    Diabetes mellitus, brittle, uncontrolled IDDM with hypoglycemia and hyperglycemia  Uncontrolled his hemoglobin was 17.7 and July 2020 current hemoglobin is 14.3. We will continue Lantus 5 units twice daily with meal coverage sliding scale insulin  Essential hypertension UNControlled continue Norvasc and lisinopril with IV hydralazine as needed  History of depression Denies any suicidal ideation Continue Lexapro   History of smoking Continue nicotine patch  Homeless Case management assisting with housing  Protein calorie malnutrition, moderate to severe - His BMI is 17.3 registered dietitian is following  Code Status: DNR  Severity of Illness: The appropriate patient status for this patient is INPATIENT. Inpatient status is judged to be reasonable and necessary in order to provide the required intensity of service to ensure the patient's safety. The patient's presenting symptoms, physical exam findings, and initial radiographic and laboratory data in the context of their chronic comorbidities is felt to place them at high risk for further  clinical deterioration. Furthermore, it is not anticipated that the patient will be medically stable for discharge from the hospital within 2 midnights of admission. The following factors support the patient status of inpatient.   " The patient's presenting symptoms include on IV antibiotics. " The worrisome physical exam findings include on IV antibiotics. " The initial radiographic and laboratory data are worrisome because of an IV antibiotics. " The chronic co-morbidities include on IV antibiotics and homeless.   * I certify that at the point of admission it is my clinical judgment that the patient will require inpatient hospital care spanning beyond 2 midnights from the point of admission due to high intensity of service, high risk for further deterioration and high frequency of surveillance required.*    Family Communication: None at bedside  Disposition Plan: Patient is to remain on IV antibiotics till June 25, 2019   Consultants:  ID  Procedures:  None  Antimicrobials:  Ancef  DVT prophylaxis: SCD  Objective: Vitals:   06/20/19 0616 06/20/19 1504 06/20/19 2112 06/21/19 0418  BP: (!) 151/80 135/73 (!) 153/86 (!) 151/80  Pulse: 78 76 72 76  Resp: 18 15 16 16   Temp: 98.2 F (36.8 C) 98.2 F (36.8 C) 98.1 F (36.7 C) 97.9 F (36.6 C)  TempSrc: Oral Oral Oral Oral  SpO2: 97% 99% 99% 100%  Weight:      Height:        Intake/Output Summary (Last 24 hours) at 06/21/2019 0904 Last data filed at 06/21/2019 0019 Gross per 24 hour  Intake 1543.86 ml  Output --  Net 1543.86 ml   Filed Weights   05/14/19 0240 05/20/19 1221 06/02/19 1916  Weight: 46.4 kg 46.4 kg 51.7 kg   Body  mass index is 17.32 kg/m.  Exam:   General: 53 y.o. year-old male well developed well nourished in no acute distress.  Alert and oriented x3.  Cardiovascular: Regular rate and rhythm with no rubs or gallops.  No thyromegaly or JVD noted.    Respiratory: Clear to auscultation with  no wheezes or rales. Good inspiratory effort.  Abdomen: Soft nontender nondistended with normal bowel sounds x4 quadrants.  Musculoskeletal: No lower extremity edema. 2/4 pulses in all 4 extremities.  Skin: No ulcerative lesions noted or rashes,  Psychiatry: Mood is appropriate for condition and setting    Data Reviewed: CBC: Recent Labs  Lab 06/17/19 0655 06/18/19 0518  WBC 6.1 6.5  HGB 8.3* 8.7*  HCT 27.4* 28.4*  MCV 100.0 101.1*  PLT 154 149*   Basic Metabolic Panel: Recent Labs  Lab 06/17/19 0655 06/18/19 0518  NA 138 139  K 3.9 4.3  CL 109 110  CO2 22 21*  GLUCOSE 156* 177*  BUN 31* 34*  CREATININE 0.67 0.87  CALCIUM 8.8* 9.1   GFR: Estimated Creatinine Clearance: 71 mL/min (by C-G formula based on SCr of 0.87 mg/dL). Liver Function Tests: No results for input(s): AST, ALT, ALKPHOS, BILITOT, PROT, ALBUMIN in the last 168 hours. No results for input(s): LIPASE, AMYLASE in the last 168 hours. No results for input(s): AMMONIA in the last 168 hours. Coagulation Profile: No results for input(s): INR, PROTIME in the last 168 hours. Cardiac Enzymes: No results for input(s): CKTOTAL, CKMB, CKMBINDEX, TROPONINI in the last 168 hours. BNP (last 3 results) No results for input(s): PROBNP in the last 8760 hours. HbA1C: No results for input(s): HGBA1C in the last 72 hours. CBG: Recent Labs  Lab 06/20/19 0757 06/20/19 1108 06/20/19 1706 06/20/19 2111 06/21/19 0840  GLUCAP 196* 254* 230* 261* 166*   Lipid Profile: No results for input(s): CHOL, HDL, LDLCALC, TRIG, CHOLHDL, LDLDIRECT in the last 72 hours. Thyroid Function Tests: No results for input(s): TSH, T4TOTAL, FREET4, T3FREE, THYROIDAB in the last 72 hours. Anemia Panel: No results for input(s): VITAMINB12, FOLATE, FERRITIN, TIBC, IRON, RETICCTPCT in the last 72 hours. Urine analysis:    Component Value Date/Time   COLORURINE STRAW (A) 05/13/2019 2028   APPEARANCEUR HAZY (A) 05/13/2019 2028    LABSPEC 1.024 05/13/2019 2028   PHURINE 6.0 05/13/2019 2028   GLUCOSEU >=500 (A) 05/13/2019 2028   HGBUR SMALL (A) 05/13/2019 2028   BILIRUBINUR NEGATIVE 05/13/2019 2028   BILIRUBINUR negative 03/15/2018 1049   BILIRUBINUR negative 12/26/2017 0941   KETONESUR NEGATIVE 05/13/2019 2028   PROTEINUR 30 (A) 05/13/2019 2028   UROBILINOGEN 0.2 03/15/2018 1049   UROBILINOGEN 0.2 12/08/2010 1613   NITRITE POSITIVE (A) 05/13/2019 2028   LEUKOCYTESUR LARGE (A) 05/13/2019 2028   Sepsis Labs: @LABRCNTIP (procalcitonin:4,lacticidven:4)  )No results found for this or any previous visit (from the past 240 hour(s)).    Studies: No results found.  Scheduled Meds:  amLODipine  5 mg Oral Daily   atorvastatin  20 mg Oral q1800   escitalopram  5 mg Oral Daily   feeding supplement (GLUCERNA SHAKE)  237 mL Oral BID BM   feeding supplement (PRO-STAT SUGAR FREE 64)  30 mL Oral BID   folic acid  1 mg Oral Daily   influenza vac split quadrivalent PF  0.5 mL Intramuscular Tomorrow-1000   insulin aspart  0-5 Units Subcutaneous QHS   insulin aspart  0-9 Units Subcutaneous TID WC   insulin aspart  4 Units Subcutaneous TID WC   insulin  glargine  5 Units Subcutaneous BID   lipase/protease/amylase  12,000 Units Oral TID WC   lisinopril  10 mg Oral Daily   magnesium oxide  400 mg Oral BID   multivitamin with minerals  1 tablet Oral Daily   nicotine  14 mg Transdermal Daily   thiamine  100 mg Oral Daily    Continuous Infusions:  sodium chloride Stopped (06/20/19 2313)    ceFAZolin (ANCEF) IV 2 g (06/21/19 0454)     LOS: 38 days     Myrtie Neither, MD [facility]  To reach me or the doctor on call, go to: [email or web address] Password [email or web address]  06/21/2019, 9:04 AM

## 2019-06-22 LAB — GLUCOSE, CAPILLARY
Glucose-Capillary: 193 mg/dL — ABNORMAL HIGH (ref 70–99)
Glucose-Capillary: 111 mg/dL — ABNORMAL HIGH (ref 70–99)
Glucose-Capillary: 212 mg/dL — ABNORMAL HIGH (ref 70–99)
Glucose-Capillary: 196 mg/dL — ABNORMAL HIGH (ref 70–99)

## 2019-06-22 NOTE — Progress Notes (Signed)
PROGRESS NOTE  Rodney Barber XBW:[id number] DOB: [birth year] DOA: 05/13/2019 PCP: [name], NP  HPI/Recap of past 53 hours: 53 year old male with history of insulin-dependent diabetes mellitus hypertension, chronic pancreatitis, smoker, recent prolonged admission in the hospital complicated by initial suicidal ideation later cleared, now homeless, found to have MSSA bacteremia, currently on antibiotics IV, due to his IV drug use he is required to stay in the hospital to complete his IV antibiotic treatment.  Subjective June 22, 2019: Patient seen and examined at bedside denies any problem  Assessment/Plan: Principal Problem:   Hypokalemia Active Problems:   Uncontrolled type 2 diabetes mellitus (HCC)   UTI (urinary tract infection)   Hyperglycemia   Tobacco use   Abnormal blood electrolyte level  MSSA UTI, MSSA left neck abscess, C-spine osteomyelitis, MSSA bacteremia  -Urine culture had MSSA, left neck aspiration also had MSSA, blood culture on May 16, 2019 showed MSSA. TEE showed no vegetation Patient is currently on IV Ancef for 6 weeks Per ID we will continue cefazolin until November 18.  Will need to reconsult infectious disease before the discharge date to assess if patient needs continued oral antibiotic.    Diabetes mellitus, brittle, uncontrolled IDDM with hypoglycemia and hyperglycemia  Uncontrolled his hemoglobin was 17.7 and July 2020 current hemoglobin is 14.3. We will continue Lantus 5 units twice daily with meal coverage sliding scale insulin  Essential hypertension UNControlled continue Norvasc and lisinopril with IV hydralazine as needed  History of depression Denies any suicidal ideation Continue Lexapro   History of smoking Continue nicotine patch  Homeless Case management assisting with housing  Protein calorie malnutrition, moderate to severe - His BMI is 17.3 registered dietitian is following  Code Status: DNR   Severity of Illness: The appropriate patient status for this patient is INPATIENT. Inpatient status is judged to be reasonable and necessary in order to provide the required intensity of service to ensure the patient's safety. The patient's presenting symptoms, physical exam findings, and initial radiographic and laboratory data in the context of their chronic comorbidities is felt to place them at high risk for further clinical deterioration. Furthermore, it is not anticipated that the patient will be medically stable for discharge from the hospital within 2 midnights of admission. The following factors support the patient status of inpatient.   " The patient's presenting symptoms include on IV antibiotics. " The worrisome physical exam findings include on IV antibiotics. " The initial radiographic and laboratory data are worrisome because of an IV antibiotics. " The chronic co-morbidities include on IV antibiotics and homeless.   * I certify that at the point of admission it is my clinical judgment that the patient will require inpatient hospital care spanning beyond 2 midnights from the point of admission due to high intensity of service, high risk for further deterioration and high frequency of surveillance required.*    Family Communication: None at bedside  Disposition Plan: Patient is to remain on IV antibiotics till June 25, 2019   Consultants:  ID  Procedures:  None  Antimicrobials:  Ancef  DVT prophylaxis: SCD  Objective: Vitals:   06/21/19 0418 06/21/19 1653 06/21/19 2014 06/22/19 0515  BP: (!) 151/80 136/79 (!) 148/90 139/84  Pulse: 76 77 79 76  Resp: 16 18 18 16   Temp: 97.9 F (36.6 C) 98.1 F (36.7 C) 98.1 F (36.7 C) 98.2 F (36.8 C)  TempSrc: Oral Oral Oral Oral  SpO2: 100% 100% 100% 97%  Weight:    56.3 kg  Height:        Intake/Output Summary (Last 24 hours) at 06/22/2019 1011 Last data filed at 06/22/2019 0200 Gross per 24 hour  Intake  1194.91 ml  Output -  Net 1194.91 ml   Filed Weights   05/20/19 1221 06/02/19 1916 06/22/19 0515  Weight: 46.4 kg 51.7 kg 56.3 kg   Body mass index is 18.87 kg/m.  Exam:  . General: 53 y.o. year-old male well developed well nourished in no acute distress.  Alert and oriented x3.  Pleasant . Cardiovascular: Regular rate and rhythm with no rubs or gallops.  No thyromegaly or JVD noted.   Marland Kitchen Respiratory: Clear to auscultation with no wheezes or rales. Good inspiratory effort. . Abdomen: Soft nontender nondistended with normal bowel sounds x4 quadrants. . Musculoskeletal: No lower extremity edema. 2/4 pulses in all 4 extremities. . Skin: No ulcerative lesions noted or rashes, . Psychiatry: Mood is appropriate for condition and setting: Calm making eye contact interactive    Data Reviewed: CBC: Recent Labs  Lab 06/17/19 0655 06/18/19 0518  WBC 6.1 6.5  HGB 8.3* 8.7*  HCT 27.4* 28.4*  MCV 100.0 101.1*  PLT 154 149*   Basic Metabolic Panel: Recent Labs  Lab 06/17/19 0655 06/18/19 0518  NA 138 139  K 3.9 4.3  CL 109 110  CO2 22 21*  GLUCOSE 156* 177*  BUN 31* 34*  CREATININE 0.67 0.87  CALCIUM 8.8* 9.1   GFR: Estimated Creatinine Clearance: 77.3 mL/min (by C-G formula based on SCr of 0.87 mg/dL). Liver Function Tests: No results for input(s): AST, ALT, ALKPHOS, BILITOT, PROT, ALBUMIN in the last 168 hours. No results for input(s): LIPASE, AMYLASE in the last 168 hours. No results for input(s): AMMONIA in the last 168 hours. Coagulation Profile: No results for input(s): INR, PROTIME in the last 168 hours. Cardiac Enzymes: No results for input(s): CKTOTAL, CKMB, CKMBINDEX, TROPONINI in the last 168 hours. BNP (last 3 results) No results for input(s): PROBNP in the last 8760 hours. HbA1C: No results for input(s): HGBA1C in the last 72 hours. CBG: Recent Labs  Lab 06/21/19 0840 06/21/19 1244 06/21/19 1651 06/21/19 2104 06/22/19 0752  GLUCAP 166* 239* 251*  183* 193*   Lipid Profile: No results for input(s): CHOL, HDL, LDLCALC, TRIG, CHOLHDL, LDLDIRECT in the last 72 hours. Thyroid Function Tests: No results for input(s): TSH, T4TOTAL, FREET4, T3FREE, THYROIDAB in the last 72 hours. Anemia Panel: No results for input(s): VITAMINB12, FOLATE, FERRITIN, TIBC, IRON, RETICCTPCT in the last 72 hours. Urine analysis:    Component Value Date/Time   COLORURINE STRAW (A) 05/13/2019 2028   APPEARANCEUR HAZY (A) 05/13/2019 2028   LABSPEC 1.024 05/13/2019 2028   PHURINE 6.0 05/13/2019 2028   GLUCOSEU >=500 (A) 05/13/2019 2028   HGBUR SMALL (A) 05/13/2019 2028   BILIRUBINUR NEGATIVE 05/13/2019 2028   BILIRUBINUR negative 03/15/2018 1049   BILIRUBINUR negative 12/26/2017 0941   KETONESUR NEGATIVE 05/13/2019 2028   PROTEINUR 30 (A) 05/13/2019 2028   UROBILINOGEN 0.2 03/15/2018 1049   UROBILINOGEN 0.2 12/08/2010 1613   NITRITE POSITIVE (A) 05/13/2019 2028   LEUKOCYTESUR LARGE (A) 05/13/2019 2028   Sepsis Labs: @LABRCNTIP (procalcitonin:4,lacticidven:4)  )No results found for this or any previous visit (from the past 240 hour(s)).    Studies: No results found.  Scheduled Meds: . amLODipine  5 mg Oral Daily  . atorvastatin  20 mg Oral q1800  . escitalopram  5 mg Oral Daily  . feeding supplement (GLUCERNA SHAKE)  237 mL Oral BID  BM  . feeding supplement (PRO-STAT SUGAR FREE 64)  30 mL Oral BID  . folic acid  1 mg Oral Daily  . influenza vac split quadrivalent PF  0.5 mL Intramuscular Tomorrow-1000  . insulin aspart  0-5 Units Subcutaneous QHS  . insulin aspart  0-9 Units Subcutaneous TID WC  . insulin aspart  4 Units Subcutaneous TID WC  . insulin glargine  5 Units Subcutaneous BID  . lipase/protease/amylase  12,000 Units Oral TID WC  . lisinopril  10 mg Oral Daily  . magnesium oxide  400 mg Oral BID  . multivitamin with minerals  1 tablet Oral Daily  . nicotine  14 mg Transdermal Daily  . thiamine  100 mg Oral Daily    Continuous  Infusions: . sodium chloride 10 mL/hr at 06/22/19 0200  .  ceFAZolin (ANCEF) IV 2 g (06/22/19 2774)     LOS: 25 days     Cristal Deer, MD [facility]  To reach me or the doctor on call, go to: [email or web address] Password [email or web address]  06/22/2019, 10:11 AM

## 2019-06-23 LAB — COMPREHENSIVE METABOLIC PANEL
ALT: 19 U/L (ref 0–44)
AST: 40 U/L (ref 15–41)
Albumin: 3.2 g/dL — ABNORMAL LOW (ref 3.5–5.0)
Alkaline Phosphatase: 78 U/L (ref 38–126)
Anion gap: 7 (ref 5–15)
BUN: 26 mg/dL — ABNORMAL HIGH (ref 6–20)
CO2: 22 mmol/L (ref 22–32)
Calcium: 8.8 mg/dL — ABNORMAL LOW (ref 8.9–10.3)
Chloride: 112 mmol/L — ABNORMAL HIGH (ref 98–111)
Creatinine, Ser: 0.78 mg/dL (ref 0.61–1.24)
GFR calc Af Amer: >60 mL/min (ref >60)
GFR calc non Af Amer: >60 mL/min (ref >60)
Glucose, Bld: 152 mg/dL — ABNORMAL HIGH (ref 70–99)
Potassium: 4.2 mmol/L (ref 3.5–5.1)
Sodium: 141 mmol/L (ref 135–145)
Total Bilirubin: 0.2 mg/dL — ABNORMAL LOW (ref 0.3–1.2)
Total Protein: 6 g/dL — ABNORMAL LOW (ref 6.5–8.1)

## 2019-06-23 LAB — GLUCOSE, CAPILLARY
Glucose-Capillary: 127 mg/dL — ABNORMAL HIGH (ref 70–99)
Glucose-Capillary: 189 mg/dL — ABNORMAL HIGH (ref 70–99)
Glucose-Capillary: 178 mg/dL — ABNORMAL HIGH (ref 70–99)
Glucose-Capillary: 151 mg/dL — ABNORMAL HIGH (ref 70–99)
Glucose-Capillary: 205 mg/dL — ABNORMAL HIGH (ref 70–99)

## 2019-06-23 NOTE — Progress Notes (Signed)
Nutrition Follow-up  DOCUMENTATION CODES:   Underweight, Severe malnutrition in context of chronic illness  INTERVENTION:   - ContinueGlucerna Shake poBID, each supplement provides 220 kcal and 10 grams of protein  - Continue Pro-stat 30 ml BID, each supplement provides 100 kcal and 15 grams of protein  - Continue MVI with minerals daily  NUTRITION DIAGNOSIS:   Severe Malnutrition related to chronic illness as evidenced by severe fat depletion, severe muscle depletion.  Ongoing, being addressed via oral nutrition supplements  GOAL:   Patient will meet greater than or equal to 90% of their needs  Progressing  MONITOR:   PO intake, Supplement acceptance, Labs, Weight trends  REASON FOR ASSESSMENT:   Malnutrition Screening Tool    ASSESSMENT:   53 year old male with history of insulin-dependent DM, HTN, chronic pancreatitis, smoker, and recent prolonged hospitalization complicated by suicidal ideation later cleared, deconditioning, now homeless. He presented to the ED on 10/6 with extreme weakness and leg cramps. EMS found CBG to be >500 mg/dl and in the ED he was hypertensive. He was admitted for hyperglycemia and hypokalemia. Patient reported being out of insulin x1 week PTA and having diarrhea at home 2/2 taking lactulose.  Pt to remain hospitalized until completion of 6 weeks of IV abx therapy on 11/18.  Pt continues to accept almost 100% of Glucerna shakes and Pro-stat supplements when offered per [facility].  Weight up a total of 22 lbs since first measured weight on 10/07. Weight gain is desirable in this pt with severe malnutrition.  Meal Completion: 100% x last 8 recorded meals  Medications reviewed and include: Glucerna BID, Pro-stat BID, folic acid, SSI, Novolog 4 units TID with meals, Lantus 5 units BID, Creon 12,000 units TID with meals, magnesium oxide 400 mg BID, MVI with minerals, thiamine, IV abx  Labs reviewed. CBG's: 111-212 x 24 hours  Diet Order:    Diet Order            Diet Carb Modified Fluid consistency: Thin; Room service appropriate? Yes  Diet effective now              EDUCATION NEEDS:   Not appropriate for education at this time  Skin:  Skin Assessment: Reviewed RN Assessment  Last BM:  06/23/19 per pt report  Height:   Ht Readings from Last 1 Encounters:  05/20/19 5\' 8"  (1.727 m)    Weight:   Wt Readings from Last 1 Encounters:  06/22/19 56.3 kg    Ideal Body Weight:  70 kg  BMI:  Body mass index is 18.87 kg/m.  Estimated Nutritional Needs:   Kcal:  1855-2090 kcal (40-45 kcal/kg)  Protein:  80-90 grams  Fluid:  >/= 2 L/day    Gaynell Face, MS, RD, LDN Inpatient Clinical Dietitian Pager: [phone] Weekend/After Hours: [phone]

## 2019-06-23 NOTE — Progress Notes (Signed)
PROGRESS NOTE  Rodney Barber ZOX:[id number] DOB: 07-15-1966 DOA: 05/13/2019 PCP: Rodney Rigg, NP  HPI/Recap of past 24 hours: 53 year old male with history of insulin-dependent diabetes mellitus hypertension, chronic pancreatitis, smoker, recent prolonged admission in the hospital complicated by initial suicidal ideation later cleared, now homeless, found to have MSSA bacteremia, currently on antibiotics IV, due to his IV drug use he is required to stay in the hospital to complete his IV antibiotic treatment.  Subjective November 16 , 2020: Patient seen and examined at bedside denies any problem.  He is eating and drinking fine, denies any complaints such as fever chills headache.  He is awaiting to finish his IV antibiotic course at least until 06/25/2019.   Assessment/Plan: Principal Problem:   Hypokalemia Active Problems:   Uncontrolled type 2 diabetes mellitus (HCC)   UTI (urinary tract infection)   Hyperglycemia   Tobacco use   Abnormal blood electrolyte level  MSSA UTI, MSSA left neck abscess, C-spine osteomyelitis, MSSA bacteremia  -Urine culture had MSSA, left neck aspiration also had MSSA, blood culture on May 16, 2019 showed MSSA. TEE showed no vegetation Patient is currently on IV Ancef for 6 weeks Per ID we will continue cefazolin until November 18.  Will need to reconsult infectious disease before the discharge date to assess if patient needs continued oral antibiotic.    Diabetes mellitus, brittle, uncontrolled IDDM with hypoglycemia and hyperglycemia  Uncontrolled his hemoglobin was 17.7 and July 2020 current hemoglobin is 14.3. Recheck A1c.  We will switch insulin to oral medications due to concern for compliance and cost.   Essential hypertension Better controlled.  Continue Norvasc and lisinopril with IV hydralazine as needed  History of depression Denies any suicidal ideation Continue Lexapro   History of smoking Continue nicotine  patch  Homeless Case management assisting with housing  Protein calorie malnutrition, moderate to severe - His BMI is 17.3 registered dietitian is following  Code Status: DNR  Severity of Illness: The appropriate patient status for this patient is INPATIENT. Inpatient status is judged to be reasonable and necessary in order to provide the required intensity of service to ensure the patient's safety. The patient's presenting symptoms, physical exam findings, and initial radiographic and laboratory data in the context of their chronic comorbidities is felt to place them at high risk for further clinical deterioration. Furthermore, it is not anticipated that the patient will be medically stable for discharge from the hospital within 2 midnights of admission. The following factors support the patient status of inpatient.   " The patient's presenting symptoms include on IV antibiotics. " The worrisome physical exam findings include on IV antibiotics. " The initial radiographic and laboratory data are worrisome because of an IV antibiotics. " The chronic co-morbidities include on IV antibiotics and homeless.   * I certify that at the point of admission it is my clinical judgment that the patient will require inpatient hospital care spanning beyond 2 midnights from the point of admission due to high intensity of service, high risk for further deterioration and high frequency of surveillance required.*    Family Communication: None at bedside  Disposition Plan: Patient is to remain on IV antibiotics till June 25, 2019   Consultants:  ID  Procedures:  None  Antimicrobials:  Ancef  DVT prophylaxis: SCD  Objective: Vitals:   06/22/19 2100 06/23/19 0454 06/23/19 0930 06/23/19 1354  BP: (!) 148/75 (!) 145/79 130/75 138/79  Pulse: 77 73 72 81  Resp: 16 16 16  16  Temp: 98.2 F (36.8 C) 98.4 F (36.9 C) 97.8 F (36.6 C) 98 F (36.7 C)  TempSrc: Oral Oral Oral Oral  SpO2:  96% 100% 100% 99%  Weight:      Height:        Intake/Output Summary (Last 24 hours) at 06/23/2019 1808 Last data filed at 06/23/2019 1554 Gross per 24 hour  Intake 2016.51 ml  Output -  Net 2016.51 ml   Filed Weights   05/20/19 1221 06/02/19 1916 06/22/19 0515  Weight: 46.4 kg 51.7 kg 56.3 kg   Body mass index is 18.87 kg/m.  Exam:  . General: 53 y.o. year-old male well developed well nourished in no acute distress.  Alert and oriented x3.  Pleasant . Cardiovascular: Regular rate and rhythm with no rubs or gallops.  No thyromegaly or JVD noted.   Marland Kitchen Respiratory: Clear to auscultation with no wheezes or rales. Good inspiratory effort. . Abdomen: Soft nontender nondistended with normal bowel sounds x4 quadrants. . Musculoskeletal: No lower extremity edema. 2/4 pulses in all 4 extremities. . Skin: No ulcerative lesions noted or rashes, . Psychiatry: Mood is appropriate for condition and setting: Calm making eye contact interactive    Data Reviewed: CBC: Recent Labs  Lab 06/17/19 0655 06/18/19 0518  WBC 6.1 6.5  HGB 8.3* 8.7*  HCT 27.4* 28.4*  MCV 100.0 101.1*  PLT 154 149*   Basic Metabolic Panel: Recent Labs  Lab 06/17/19 0655 06/18/19 0518 06/23/19 0403  NA 138 139 141  K 3.9 4.3 4.2  CL 109 110 112*  CO2 22 21* 22  GLUCOSE 156* 177* 152*  BUN 31* 34* 26*  CREATININE 0.67 0.87 0.78  CALCIUM 8.8* 9.1 8.8*   GFR: Estimated Creatinine Clearance: 84.1 mL/min (by C-G formula based on SCr of 0.78 mg/dL). Liver Function Tests: Recent Labs  Lab 06/23/19 0403  AST 40  ALT 19  ALKPHOS 78  BILITOT 0.2*  PROT 6.0*  ALBUMIN 3.2*   No results for input(s): LIPASE, AMYLASE in the last 168 hours. No results for input(s): AMMONIA in the last 168 hours. Coagulation Profile: No results for input(s): INR, PROTIME in the last 168 hours. Cardiac Enzymes: No results for input(s): CKTOTAL, CKMB, CKMBINDEX, TROPONINI in the last 168 hours. BNP (last 3 results) No  results for input(s): PROBNP in the last 8760 hours. HbA1C: No results for input(s): HGBA1C in the last 72 hours. CBG: Recent Labs  Lab 06/22/19 1631 06/22/19 2137 06/23/19 0741 06/23/19 1204 06/23/19 1644  GLUCAP 212* 196* 127* 189* 178*   Lipid Profile: No results for input(s): CHOL, HDL, LDLCALC, TRIG, CHOLHDL, LDLDIRECT in the last 72 hours. Thyroid Function Tests: No results for input(s): TSH, T4TOTAL, FREET4, T3FREE, THYROIDAB in the last 72 hours. Anemia Panel: No results for input(s): VITAMINB12, FOLATE, FERRITIN, TIBC, IRON, RETICCTPCT in the last 72 hours. Urine analysis:    Component Value Date/Time   COLORURINE STRAW (A) 05/13/2019 2028   APPEARANCEUR HAZY (A) 05/13/2019 2028   LABSPEC 1.024 05/13/2019 2028   PHURINE 6.0 05/13/2019 2028   GLUCOSEU >=500 (A) 05/13/2019 2028   HGBUR SMALL (A) 05/13/2019 2028   BILIRUBINUR NEGATIVE 05/13/2019 2028   BILIRUBINUR negative 03/15/2018 1049   BILIRUBINUR negative 12/26/2017 0941   KETONESUR NEGATIVE 05/13/2019 2028   PROTEINUR 30 (A) 05/13/2019 2028   UROBILINOGEN 0.2 03/15/2018 1049   UROBILINOGEN 0.2 12/08/2010 1613   NITRITE POSITIVE (A) 05/13/2019 2028   LEUKOCYTESUR LARGE (A) 05/13/2019 2028   Sepsis Labs: @LABRCNTIP (procalcitonin:4,lacticidven:4)  )No  results found for this or any previous visit (from the past 240 hour(s)).    Studies: No results found.  Scheduled Meds: . amLODipine  5 mg Oral Daily  . atorvastatin  20 mg Oral q1800  . escitalopram  5 mg Oral Daily  . feeding supplement (GLUCERNA SHAKE)  237 mL Oral BID BM  . feeding supplement (PRO-STAT SUGAR FREE 64)  30 mL Oral BID  . folic acid  1 mg Oral Daily  . influenza vac split quadrivalent PF  0.5 mL Intramuscular Tomorrow-1000  . insulin aspart  0-5 Units Subcutaneous QHS  . insulin aspart  0-9 Units Subcutaneous TID WC  . insulin aspart  4 Units Subcutaneous TID WC  . insulin glargine  5 Units Subcutaneous BID  .  lipase/protease/amylase  12,000 Units Oral TID WC  . lisinopril  10 mg Oral Daily  . magnesium oxide  400 mg Oral BID  . multivitamin with minerals  1 tablet Oral Daily  . nicotine  14 mg Transdermal Daily  . thiamine  100 mg Oral Daily    Continuous Infusions: . sodium chloride 250 mL (06/23/19 1751)  .  ceFAZolin (ANCEF) IV 2 g (06/23/19 1309)     LOS: 40 days     Jarek Longton Hilbert Bible, MD [facility]  To reach me or the doctor on call, go to: [email or web address] Password [facility]  06/23/2019, 6:08 PM

## 2019-06-24 LAB — CBC WITH DIFFERENTIAL/PLATELET
Abs Immature Granulocytes: 0.04 10*3/uL (ref 0.00–0.07)
Basophils Absolute: 0.1 10*3/uL (ref 0.0–0.1)
Basophils Relative: 1 %
Eosinophils Absolute: 0.2 10*3/uL (ref 0.0–0.5)
Eosinophils Relative: 3 %
HCT: 27.3 % — ABNORMAL LOW (ref 39.0–52.0)
Hemoglobin: 8.3 g/dL — ABNORMAL LOW (ref 13.0–17.0)
Immature Granulocytes: 1 %
Lymphocytes Relative: 27 %
Lymphs Abs: 1.9 10*3/uL (ref 0.7–4.0)
MCH: 30.5 pg (ref 26.0–34.0)
MCHC: 30.4 g/dL (ref 30.0–36.0)
MCV: 100.4 fL — ABNORMAL HIGH (ref 80.0–100.0)
Monocytes Absolute: 0.5 10*3/uL (ref 0.1–1.0)
Monocytes Relative: 8 %
Neutro Abs: 4.2 10*3/uL (ref 1.7–7.7)
Neutrophils Relative %: 60 %
Platelets: 142 10*3/uL — ABNORMAL LOW (ref 150–400)
RBC: 2.72 MIL/uL — ABNORMAL LOW (ref 4.22–5.81)
RDW: 14.1 % (ref 11.5–15.5)
WBC: 6.9 10*3/uL (ref 4.0–10.5)
nRBC: 0 % (ref 0.0–0.2)

## 2019-06-24 LAB — GLUCOSE, CAPILLARY
Glucose-Capillary: 162 mg/dL — ABNORMAL HIGH (ref 70–99)
Glucose-Capillary: 125 mg/dL — ABNORMAL HIGH (ref 70–99)
Glucose-Capillary: 235 mg/dL — ABNORMAL HIGH (ref 70–99)
Glucose-Capillary: 258 mg/dL — ABNORMAL HIGH (ref 70–99)

## 2019-06-24 LAB — BASIC METABOLIC PANEL
Anion gap: 4 — ABNORMAL LOW (ref 5–15)
BUN: 28 mg/dL — ABNORMAL HIGH (ref 6–20)
CO2: 23 mmol/L (ref 22–32)
Calcium: 8.7 mg/dL — ABNORMAL LOW (ref 8.9–10.3)
Chloride: 110 mmol/L (ref 98–111)
Creatinine, Ser: 0.85 mg/dL (ref 0.61–1.24)
GFR calc Af Amer: >60 mL/min (ref >60)
GFR calc non Af Amer: >60 mL/min (ref >60)
Glucose, Bld: 177 mg/dL — ABNORMAL HIGH (ref 70–99)
Potassium: 4 mmol/L (ref 3.5–5.1)
Sodium: 137 mmol/L (ref 135–145)

## 2019-06-24 LAB — HEMOGLOBIN A1C
Hgb A1c MFr Bld: 9.8 % — ABNORMAL HIGH (ref 4.8–5.6)
Mean Plasma Glucose: 234.56 mg/dL

## 2019-06-24 MED ORDER — GLIPIZIDE ER 5 MG PO TB24
5.0000 mg | ORAL_TABLET | Freq: Every day | ORAL | Status: DC
  Administered 2019-06-24 – 2019-06-25 (×2): 5 mg via ORAL
  Filled 2019-06-24 (×2): qty 1

## 2019-06-24 MED ORDER — METFORMIN HCL ER 500 MG PO TB24
500.0000 mg | ORAL_TABLET | Freq: Every day | ORAL | Status: DC
  Administered 2019-06-24 – 2019-06-25 (×2): 500 mg via ORAL
  Filled 2019-06-24 (×2): qty 1

## 2019-06-24 NOTE — Progress Notes (Signed)
PROGRESS NOTE  Rodney Barber ONG:[id number] DOB: 1966-06-20 DOA: 05/13/2019 PCP: [name], NP  HPI/Recap of past 93 hours: 53 year old male with history of insulin-dependent diabetes mellitus hypertension, chronic pancreatitis, smoker, recent prolonged admission in the hospital complicated by initial suicidal ideation later cleared, now homeless, found to have MSSA bacteremia, currently on antibiotics IV, due to his IV drug use he is required to stay in the hospital to complete his IV antibiotic treatment.  Subjective November 17  , 2020: Patient seen and examined at bedside denies any problem.  He is eating and drinking fine, denies any complaints such as fever chills headache.  He is awaiting to finish his IV antibiotic course at least until 06/25/2019.   Assessment/Plan: Principal Problem:   Hypokalemia Active Problems:   Uncontrolled type 2 diabetes mellitus (HCC)   UTI (urinary tract infection)   Hyperglycemia   Tobacco use   Abnormal blood electrolyte level  MSSA UTI, MSSA left neck abscess, C-spine osteomyelitis, MSSA bacteremia  -Urine culture had MSSA, left neck aspiration also had MSSA, blood culture on May 16, 2019 showed MSSA. TEE showed no vegetation Patient is currently on IV Ancef for 6 weeks Per ID we will continue cefazolin until November 18.  Discussed with Dr. Algis Liming infectious disease.  He recommends continuing Keflex 5 mg 4 times daily for at least 1 month and follow-up with him as outpatient   Diabetes mellitus, brittle, uncontrolled IDDM with hypoglycemia and hyperglycemia  Uncontrolled his hemoglobin was 17.7 and July 2020 current hemoglobin is 14.3. Recheck A1c.  We will switch insulin to oral Metformin and glipizide due to concern for compliance and cost.   Essential hypertension Better controlled.  Continue Norvasc and lisinopril with IV hydralazine as needed  History of depression Denies any suicidal ideation Continue Lexapro    History of smoking Continue nicotine patch  Homeless Case management assisting with housing  Protein calorie malnutrition, moderate to severe - His BMI is 17.3 registered dietitian is following  Code Status: DNR  Severity of Illness: The appropriate patient status for this patient is INPATIENT. Inpatient status is judged to be reasonable and necessary in order to provide the required intensity of service to ensure the patient's safety. The patient's presenting symptoms, physical exam findings, and initial radiographic and laboratory data in the context of their chronic comorbidities is felt to place them at high risk for further clinical deterioration. Furthermore, it is not anticipated that the patient will be medically stable for discharge from the hospital within 2 midnights of admission. The following factors support the patient status of inpatient.   " The patient's presenting symptoms include on IV antibiotics. " The worrisome physical exam findings include on IV antibiotics. " The initial radiographic and laboratory data are worrisome because of an IV antibiotics. " The chronic co-morbidities include on IV antibiotics and homeless.   * I certify that at the point of admission it is my clinical judgment that the patient will require inpatient hospital care spanning beyond 2 midnights from the point of admission due to high intensity of service, high risk for further deterioration and high frequency of surveillance required.*    Family Communication: None at bedside  Disposition Plan: Patient is to remain on IV antibiotics till June 25, 2019   Consultants:  ID  Procedures:  None  Antimicrobials:  Ancef  DVT prophylaxis: SCD  Objective: Vitals:   06/23/19 1354 06/23/19 2029 06/24/19 0520 06/24/19 0900  BP: 138/79 135/73 (!) 149/78 126/75  Pulse:  81 77 72 65  Resp: 16 18 18 12   Temp: 98 F (36.7 C) 97.9 F (36.6 C) 98.3 F (36.8 C) 97.8 F (36.6 C)   TempSrc: Oral Oral Oral Oral  SpO2: 99% 95% 97% 99%  Weight:      Height:        Intake/Output Summary (Last 24 hours) at 06/24/2019 1133 Last data filed at 06/24/2019 1000 Gross per 24 hour  Intake 2377.1 ml  Output -  Net 2377.1 ml   Filed Weights   05/20/19 1221 06/02/19 1916 06/22/19 0515  Weight: 46.4 kg 51.7 kg 56.3 kg   Body mass index is 18.87 kg/m.  Exam:  . General: 53 y.o. year-old male well developed well nourished in no acute distress.  Alert and oriented x3.  Pleasant . Cardiovascular: Regular rate and rhythm with no rubs or gallops.  No thyromegaly or JVD noted.   Marland Kitchen Respiratory: Clear to auscultation with no wheezes or rales. Good inspiratory effort. . Abdomen: Soft nontender nondistended with normal bowel sounds x4 quadrants. . Musculoskeletal: No lower extremity edema. 2/4 pulses in all 4 extremities. . Skin: No ulcerative lesions noted or rashes, . Psychiatry: Mood is appropriate for condition and setting: Calm making eye contact interactive    Data Reviewed: CBC: Recent Labs  Lab 06/18/19 0518 06/24/19 0605  WBC 6.5 6.9  NEUTROABS  --  4.2  HGB 8.7* 8.3*  HCT 28.4* 27.3*  MCV 101.1* 100.4*  PLT 149* 142*   Basic Metabolic Panel: Recent Labs  Lab 06/18/19 0518 06/23/19 0403 06/24/19 0605  NA 139 141 137  K 4.3 4.2 4.0  CL 110 112* 110  CO2 21* 22 23  GLUCOSE 177* 152* 177*  BUN 34* 26* 28*  CREATININE 0.87 0.78 0.85  CALCIUM 9.1 8.8* 8.7*   GFR: Estimated Creatinine Clearance: 79.1 mL/min (by C-G formula based on SCr of 0.85 mg/dL). Liver Function Tests: Recent Labs  Lab 06/23/19 0403  AST 40  ALT 19  ALKPHOS 78  BILITOT 0.2*  PROT 6.0*  ALBUMIN 3.2*   No results for input(s): LIPASE, AMYLASE in the last 168 hours. No results for input(s): AMMONIA in the last 168 hours. Coagulation Profile: No results for input(s): INR, PROTIME in the last 168 hours. Cardiac Enzymes: No results for input(s): CKTOTAL, CKMB, CKMBINDEX,  TROPONINI in the last 168 hours. BNP (last 3 results) No results for input(s): PROBNP in the last 8760 hours. HbA1C: Recent Labs    06/24/19 0605  HGBA1C 9.8*   CBG: Recent Labs  Lab 06/23/19 1204 06/23/19 1644 06/23/19 1940 06/23/19 2222 06/24/19 0752  GLUCAP 189* 178* 151* 205* 162*   Lipid Profile: No results for input(s): CHOL, HDL, LDLCALC, TRIG, CHOLHDL, LDLDIRECT in the last 72 hours. Thyroid Function Tests: No results for input(s): TSH, T4TOTAL, FREET4, T3FREE, THYROIDAB in the last 72 hours. Anemia Panel: No results for input(s): VITAMINB12, FOLATE, FERRITIN, TIBC, IRON, RETICCTPCT in the last 72 hours. Urine analysis:    Component Value Date/Time   COLORURINE STRAW (A) 05/13/2019 2028   APPEARANCEUR HAZY (A) 05/13/2019 2028   LABSPEC 1.024 05/13/2019 2028   PHURINE 6.0 05/13/2019 2028   GLUCOSEU >=500 (A) 05/13/2019 2028   HGBUR SMALL (A) 05/13/2019 2028   BILIRUBINUR NEGATIVE 05/13/2019 2028   BILIRUBINUR negative 03/15/2018 1049   BILIRUBINUR negative 12/26/2017 0941   KETONESUR NEGATIVE 05/13/2019 2028   PROTEINUR 30 (A) 05/13/2019 2028   UROBILINOGEN 0.2 03/15/2018 1049   UROBILINOGEN 0.2 12/08/2010 1613  NITRITE POSITIVE (A) 05/13/2019 2028   LEUKOCYTESUR LARGE (A) 05/13/2019 2028   Sepsis Labs: @LABRCNTIP (procalcitonin:4,lacticidven:4)  )No results found for this or any previous visit (from the past 240 hour(s)).    Studies: No results found.  Scheduled Meds: . amLODipine  5 mg Oral Daily  . atorvastatin  20 mg Oral q1800  . escitalopram  5 mg Oral Daily  . feeding supplement (GLUCERNA SHAKE)  237 mL Oral BID BM  . feeding supplement (PRO-STAT SUGAR FREE 64)  30 mL Oral BID  . folic acid  1 mg Oral Daily  . influenza vac split quadrivalent PF  0.5 mL Intramuscular Tomorrow-1000  . insulin aspart  0-5 Units Subcutaneous QHS  . insulin aspart  0-9 Units Subcutaneous TID WC  . insulin aspart  4 Units Subcutaneous TID WC  . insulin glargine   5 Units Subcutaneous BID  . lipase/protease/amylase  12,000 Units Oral TID WC  . lisinopril  10 mg Oral Daily  . magnesium oxide  400 mg Oral BID  . multivitamin with minerals  1 tablet Oral Daily  . nicotine  14 mg Transdermal Daily  . thiamine  100 mg Oral Daily    Continuous Infusions: . sodium chloride 250 mL (06/23/19 1751)  .  ceFAZolin (ANCEF) IV 2 g (06/24/19 0518)     LOS: 41 days     Ashly Yepez Hilbert Bible, MD [facility]  To reach me or the doctor on call, go to: [email or web address] Password [facility]  06/24/2019, 11:33 AM

## 2019-06-24 NOTE — TOC Progression Note (Signed)
Transition of Care [facility]) - Progression Note    Patient Details  Name: Rodney Barber MRN: [id number] Date of Birth: [birth year]  Transition [facility]) CM/SW Contact  Caley Volkert, Juliann Pulse, RN Phone Number: 06/24/2019, 4:35 PM  Clinical Narrative:Provided patient w/shelter list-informed to call. Patient laready established w/[facility]-he will call to set up pcp appt. Patient states he will call family for transport. [facility] to provide meds. No further CM needs.       Expected Discharge Plan: Homeless Shelter Barriers to Discharge: Continued Medical Work up  Expected Discharge Plan and Services Expected Discharge Plan: [place]   Discharge Planning Services: CM Consult, Medication Assistance, [facility]                                           Social Determinants of Health (SDOH) Interventions    Readmission Risk Interventions Readmission Risk Prevention Plan 06/24/2019 02/26/2019 02/22/2019  Transportation Screening Complete Complete Complete  Medication Review Press photographer) Complete Complete Complete  PCP or Specialist appointment within 3-5 days of discharge Complete Complete Complete  HRI or Home Care Consult Complete Complete Complete  SW Recovery Care/Counseling Consult Complete Complete Complete  Palliative Care Screening Not Applicable Complete Complete  [place] Not Applicable - Not Applicable  Some recent data might be hidden

## 2019-06-24 NOTE — Progress Notes (Signed)
      INFECTIOUS DISEASE ATTENDING ADDENDUM:   Date: 06/24/2019  Patient name: Rodney Barber  Medical record number: [id number]  Date of birth: [birth year]   I would extend his antibiotics with keflex 500mg  qid   Masaki Rothbauer has an appointment on 07/15/2019 with Dr. Tommy Medal  The [facility] is located in the [facility] at  [place] in [place].  [address], which is located to the left of the elevators.  Phone: [phone]  Fax: [phone]  [email or web address]  He should show up 15 minutes prior to his appt.     Rhina Brackett Dam 06/24/2019, 11:07 AM

## 2019-06-25 ENCOUNTER — Telehealth: Payer: Self-pay

## 2019-06-25 LAB — BASIC METABOLIC PANEL
Anion gap: 7 (ref 5–15)
BUN: 31 mg/dL — ABNORMAL HIGH (ref 6–20)
CO2: 24 mmol/L (ref 22–32)
Calcium: 9.2 mg/dL (ref 8.9–10.3)
Chloride: 109 mmol/L (ref 98–111)
Creatinine, Ser: 0.71 mg/dL (ref 0.61–1.24)
GFR calc Af Amer: >60 mL/min (ref >60)
GFR calc non Af Amer: >60 mL/min (ref >60)
Glucose, Bld: 164 mg/dL — ABNORMAL HIGH (ref 70–99)
Potassium: 4.2 mmol/L (ref 3.5–5.1)
Sodium: 140 mmol/L (ref 135–145)

## 2019-06-25 LAB — CBC WITH DIFFERENTIAL/PLATELET
Abs Immature Granulocytes: 0.03 10*3/uL (ref 0.00–0.07)
Basophils Absolute: 0.1 10*3/uL (ref 0.0–0.1)
Basophils Relative: 1 %
Eosinophils Absolute: 0.3 10*3/uL (ref 0.0–0.5)
Eosinophils Relative: 4 %
HCT: 28.8 % — ABNORMAL LOW (ref 39.0–52.0)
Hemoglobin: 9 g/dL — ABNORMAL LOW (ref 13.0–17.0)
Immature Granulocytes: 0 %
Lymphocytes Relative: 26 %
Lymphs Abs: 2 10*3/uL (ref 0.7–4.0)
MCH: 30.7 pg (ref 26.0–34.0)
MCHC: 31.3 g/dL (ref 30.0–36.0)
MCV: 98.3 fL (ref 80.0–100.0)
Monocytes Absolute: 0.5 10*3/uL (ref 0.1–1.0)
Monocytes Relative: 7 %
Neutro Abs: 4.7 10*3/uL (ref 1.7–7.7)
Neutrophils Relative %: 62 %
Platelets: 150 10*3/uL (ref 150–400)
RBC: 2.93 MIL/uL — ABNORMAL LOW (ref 4.22–5.81)
RDW: 14.1 % (ref 11.5–15.5)
WBC: 7.6 10*3/uL (ref 4.0–10.5)
nRBC: 0 % (ref 0.0–0.2)

## 2019-06-25 LAB — GLUCOSE, CAPILLARY
Glucose-Capillary: 204 mg/dL — ABNORMAL HIGH (ref 70–99)
Glucose-Capillary: 219 mg/dL — ABNORMAL HIGH (ref 70–99)

## 2019-06-25 MED ORDER — TRUERESULT BLOOD GLUCOSE W/DEVICE KIT: PACK | 0 refills | Status: DC

## 2019-06-25 MED ORDER — TRUEPLUS LANCETS 28G MISC: 12 refills | Status: DC

## 2019-06-25 MED ORDER — TRUE METRIX BLOOD GLUCOSE TEST VI STRP: ORAL_STRIP | 0 refills | Status: DC

## 2019-06-25 MED ORDER — ADULT MULTIVITAMIN W/MINERALS CH: 1.0000 | ORAL_TABLET | Freq: Every day | ORAL | Status: AC

## 2019-06-25 MED ORDER — ZENPEP 3000-10000 UNITS PO CPEP: 1.0000 [IU] | ORAL_CAPSULE | Freq: Three times a day (TID) | ORAL | 0 refills | Status: AC

## 2019-06-25 MED ORDER — CEPHALEXIN 500 MG PO CAPS: 500.0000 mg | ORAL_CAPSULE | Freq: Four times a day (QID) | ORAL | 0 refills | Status: AC

## 2019-06-25 MED ORDER — PANCRELIPASE (LIP-PROT-AMYL) 12000-38000 UNITS PO CPEP: 12000.0000 [IU] | ORAL_CAPSULE | Freq: Three times a day (TID) | ORAL | 3 refills | Status: DC

## 2019-06-25 MED ORDER — METFORMIN HCL ER 500 MG PO TB24: 500.0000 mg | ORAL_TABLET | Freq: Every day | ORAL | 0 refills | Status: DC

## 2019-06-25 MED ORDER — ESCITALOPRAM OXALATE 10 MG PO TABS: 5.0000 mg | ORAL_TABLET | Freq: Every day | ORAL | 2 refills | Status: DC

## 2019-06-25 MED ORDER — LISINOPRIL 20 MG PO TABS: 20.0000 mg | ORAL_TABLET | Freq: Every day | ORAL | 0 refills | Status: DC

## 2019-06-25 MED ORDER — NICOTINE 14 MG/24HR TD PT24: 14.0000 mg | MEDICATED_PATCH | Freq: Every day | TRANSDERMAL | 0 refills | Status: DC

## 2019-06-25 MED ORDER — MAGNESIUM OXIDE 400 (241.3 MG) MG PO TABS: 400.0000 mg | ORAL_TABLET | Freq: Two times a day (BID) | ORAL | 0 refills | Status: DC

## 2019-06-25 MED ORDER — AMLODIPINE BESYLATE 5 MG PO TABS: 5.0000 mg | ORAL_TABLET | Freq: Every day | ORAL | 0 refills | Status: DC

## 2019-06-25 MED ORDER — THIAMINE HCL 100 MG PO TABS: 100.0000 mg | ORAL_TABLET | Freq: Every day | ORAL | 0 refills | Status: AC

## 2019-06-25 MED ORDER — FOLIC ACID 1 MG PO TABS: 1.0000 mg | ORAL_TABLET | Freq: Every day | ORAL | 0 refills | Status: DC

## 2019-06-25 MED ORDER — ATORVASTATIN CALCIUM 20 MG PO TABS: 20.0000 mg | ORAL_TABLET | Freq: Every day | ORAL | 0 refills | Status: DC

## 2019-06-25 MED ORDER — GLIPIZIDE ER 10 MG PO TB24: 10.0000 mg | ORAL_TABLET | Freq: Every day | ORAL | 0 refills | Status: DC

## 2019-06-25 MED FILL — ?CEPHALEXIN 500MG CAPSULE: 500 | 10 days supply | Qty: 40 | Fill #0

## 2019-06-25 MED FILL — METFORMIN HCL ER 500 MG TB2: 500 | 30 days supply | Qty: 30 | Fill #0

## 2019-06-25 MED FILL — !TRUE METRIX BLOOD GLUCOSE: 1 days supply | Qty: 1 | Fill #0

## 2019-06-25 MED FILL — ?ATORVASTATIN 20 MG TABLET: 20 | 30 days supply | Qty: 30 | Fill #0

## 2019-06-25 MED FILL — MAGNESIUM OXIDE 400 MG TAB: 400 | 15 days supply | Qty: 30 | Fill #0

## 2019-06-25 MED FILL — glipiZIDE XL 10 MG TB24: 10 | 30 days supply | Qty: 30 | Fill #0

## 2019-06-25 MED FILL — ESCITALOPRAM 10 MG TABLET: 10 | 30 days supply | Qty: 15 | Fill #0

## 2019-06-25 MED FILL — LISINOPRIL 20 MG TABLET: 20 | 30 days supply | Qty: 30 | Fill #0

## 2019-06-25 MED FILL — ?AMLODIPINE BESYLATE 5 MG T: 5 MG | 30 days supply | Qty: 30 | Fill #0

## 2019-06-25 MED FILL — TRUE METRIX GLUCOSE TEST ST: 25 days supply | Qty: 100 | Fill #0

## 2019-06-25 MED FILL — NICOTINE 14 MG/24HR PATCH: 14 | 28 days supply | Qty: 28 | Fill #0

## 2019-06-25 MED FILL — ZENPEP 3000-14000 UNIT CPEP: 3000-14000 | 33 days supply | Qty: 100 | Fill #0

## 2019-06-25 MED FILL — ?FOLIC ACID 1MG TAB: 1 | 30 days supply | Qty: 30 | Fill #0

## 2019-06-25 MED FILL — TRUEplus LANCETS 28G MISC: 25 days supply | Qty: 100 | Fill #0

## 2019-06-25 NOTE — TOC Transition Note (Signed)
Transition of Care [facility]) - CM/SW Discharge Note   Patient Details  Name: Rodney Barber MRN: [id number] Date of Birth: 1966/02/06  Transition of Care [facility]) CM/SW Contact:  Dessa Phi, RN Phone Number: 06/25/2019, 10:02 AM   Clinical Narrative:   CM informed patient while standing in rm to call for pcp appt-patient voiced understanding. [facility] pharmacy will provide free fill of meds that they can fill-patient informed to go directly to [facility] pharmacy @ d/c-provided address highlighted on resource given also on d/c follow up. Patient has shelter list-will contact for their services-patient voiced understanding. Buses are running f9or free-patient is ambulatory-patient voiced understanding. No further CM needs.    Final next level of care: Homeless Shelter Barriers to Discharge: No Barriers Identified   Patient Goals and CMS Choice Patient states their goals for this hospitalization and ongoing recovery are:: go home      Discharge Placement                       Discharge Plan and Services   Discharge Planning Services: CM Consult, Medication Assistance, [facility]                                 Social Determinants of Health (SDOH) Interventions     Readmission Risk Interventions Readmission Risk Prevention Plan 06/24/2019 02/26/2019 02/22/2019  Transportation Screening Complete Complete Complete  Medication Review Press photographer) Complete Complete Complete  PCP or Specialist appointment within 3-5 days of discharge Complete Complete Complete  HRI or Home Care Consult Complete Complete Complete  SW Recovery Care/Counseling Consult Complete Complete Complete  Palliative Care Screening Not Applicable Complete Complete  [place] Not Applicable - Not Applicable  Some recent data might be hidden

## 2019-06-25 NOTE — Telephone Encounter (Signed)
Due to cost we are unable to order Creon for pt at our pharmacy.  We can assist in helping the pt re-enroll in pt assistance for this med.  Pt would not receive meds for 2-8 weeks if our clinic md was willing to sign off on application.  If appropriate can you change to Zenpep?  We have samples of Zenpep lipase 40000 units, 25000 units, or 20000 units.

## 2019-06-25 NOTE — Discharge Summary (Signed)
Physician Discharge Summary  Rodney Barber VUY:[id number] DOB: 1965-09-04 DOA: 05/13/2019  PCP: Gildardo Pounds, NP  Admit date: 05/13/2019 Discharge date: 06/25/2019  Admitted From:  Pt is homeless   Disposition:  Shelter house   Recommendations for Outpatient Follow-up:  1. Follow up with PCP in 1-2 weeks 2. Please obtain BMP/CBC in one week 3. Please follow up on the following pending results:  Home Health:  Equipment/Devices:  Discharge Condition: stable   CODE STATUS: full   Diet recommendation: Heart Healthy  / 1800 kcal ada diet    Brief/Interim Summary:  53 year old male with history of insulin-dependent diabetes mellitus hypertension, chronic pancreatitis, smoker, recent prolonged admission in the hospital complicated by initial suicidal ideation later cleared, now homeless, found to have MSSA bacteremia, currently on antibiotics IV, due to his IV drug use he is required to stay in the hospital to complete his IV antibiotic treatment. Patient was admitted to the hospital with hyperglycemia, severe hypokalemia as well UTI like presentation.  Urine culture obtained on admission grew MSSA. Culture taken from neck abscess also grew MSSA.  Blood culture negative. 10/8, CT scanning soft tissue neck showed multiloculated abscess at C1-C4 level as well as osteomyelitis in the lamina of C3-C4 level Per ID recommendation, antibiotic was switched to IV Ancef. Remains in the hospital to finish antibiotics, until 06/25/2019 due to history of IV drug abuse, unsafe to discharge with PICC line.  Patient finished his 6-week course of cefazolin in the hospital on 06/25/2019.  As per infectious disease specialist Dr. Tommy Medal, he was prescribed Keflex 200 mg 4 times a day for another 1 month and to follow-up with him in ID clinic.  During his stay, his blood pressure and blood sugar were elevated.  He was initially treated with insulin, however given concern for compliance and cost, he was  switched to oral glipizide and Metformin.  Lisinopril dose was increased to 20 mg.  He has been counseled to quit substance abuse including smoking.  On November 18, he was doing fine and was discharged home to follow-up with PCP and ID.  During his stay  MSSA UTI, MSSA left neck abscess, C-spine osteomyelitis, MSSA bacteremia  -Urine culture had MSSA, left neck aspiration also had MSSA, blood culture on May 16, 2019 showed MSSA. TEE showed no vegetation Patient is currently on IV Ancef for 6 weeks Per ID we will continue cefazolin until November 18.  Discussed with Dr. Drucilla Schmidt infectious disease.  He recommends continuing Keflex 5 mg 4 times daily for at least 1 month and follow-up with him as outpatient   Diabetes mellitus, brittle, uncontrolled IDDM with hypoglycemia and hyperglycemia  Uncontrolled his hemoglobin was 17.7 and July 2020 current hemoglobin is 14.3. Recheck A1c.  We will switch insulin to oral Metformin and glipizide due to concern for compliance and cost.   Essential hypertension Better controlled.  Continue Norvasc and lisinopril with IV hydralazine as needed  History of depression Denies any suicidal ideation Continue Lexapro   History of smoking Continue nicotine patch  Homeless Case management assisting with housing  Protein calorie malnutrition, moderate to severe - His BMI is 17.3 registered dietitian is following   Discharge Diagnoses:  Principal Problem:   Hypokalemia Active Problems:   Uncontrolled type 2 diabetes mellitus ([place])   UTI (urinary tract infection)   Hyperglycemia   Tobacco use   Abnormal blood electrolyte level    Discharge Instructions  Discharge Instructions    Diet - low sodium heart healthy  Complete by: As directed    1800 kcal aada diet, low lipids   Increase activity slowly   Complete by: As directed      Allergies as of 06/25/2019   No Known Allergies     Medication List    STOP taking  these medications   insulin detemir 100 UNIT/ML injection Commonly known as: Levemir   lipase/protease/amylase 12000 units Cpep capsule Commonly known as: CREON Replaced by: Zenpep 3000-14000 units Cpep     TAKE these medications   amLODipine 5 MG tablet Commonly known as: NORVASC Take 1 tablet (5 mg total) by mouth daily.   atorvastatin 20 MG tablet Commonly known as: LIPITOR Take 1 tablet (20 mg total) by mouth daily at 6 PM.   cephALEXin 500 MG capsule Commonly known as: KEFLEX Take 1 capsule (500 mg total) by mouth 4 (four) times daily.   escitalopram 10 MG tablet Commonly known as: Lexapro Take 0.5 tablets (5 mg total) by mouth daily.   folic acid 1 MG tablet Commonly known as: FOLVITE Take 1 tablet (1 mg total) by mouth daily.   glipiZIDE 10 MG 24 hr tablet Commonly known as: GLUCOTROL XL Take 1 tablet (10 mg total) by mouth daily with breakfast. Start taking on: June 26, 2019   lactulose 10 GM/15ML solution Commonly known as: CHRONULAC Take 30 mLs (20 g total) by mouth 2 (two) times daily.   lisinopril 20 MG tablet Commonly known as: ZESTRIL Take 1 tablet (20 mg total) by mouth daily. Start taking on: June 26, 2019   magnesium oxide 400 (241.3 Mg) MG tablet Commonly known as: MAG-OX Take 1 tablet (400 mg total) by mouth 2 (two) times daily.   metFORMIN 500 MG 24 hr tablet Commonly known as: GLUCOPHAGE-XR Take 1 tablet (500 mg total) by mouth daily with breakfast. Start taking on: June 26, 2019   multivitamin with minerals Tabs tablet Take 1 tablet by mouth daily.   nicotine 14 mg/24hr patch Commonly known as: NICODERM CQ - dosed in mg/24 hours Place 1 patch (14 mg total) onto the skin daily. Start taking on: June 26, 2019   thiamine 100 MG tablet Take 1 tablet (100 mg total) by mouth daily.   True Metrix Blood Glucose Test test strip Generic drug: glucose blood Use as instructed   TRUEplus Lancets 28G Misc uad   TRUEresult  Blood Glucose w/Device Kit uad   Zenpep 3000-14000 units Cpep Generic drug: Pancrelipase (Lip-Prot-Amyl) Take 1 Units by mouth 3 (three) times daily. Replaces: lipase/protease/amylase 12000 units Cpep capsule      Follow-up Information    [facility]. Schedule an appointment as soon as possible for a visit.   Contact information: [place] [phone] [place] Follow up.   Contact information: [address] pharmacy go directly there @ d/c for meds.         No Known Allergies  Consultations:     Procedures/Studies:  No results found.    Subjective:   Discharge Exam: Vitals:   06/25/19 0510 06/25/19 1247  BP: (!) 167/88 (!) 150/91  Pulse: 79 81  Resp:  20  Temp: 98 F (36.7 C) (!) 97.5 F (36.4 C)  SpO2: 99% 100%   Vitals:   06/24/19 1330 06/24/19 2038 06/25/19 0510 06/25/19 1247  BP: (!) 148/82 (!) 143/77 (!) 167/88 (!) 150/91  Pulse: 72 80 79 81  Resp: 14 18  20   Temp: 98 F (36.7 C) 97.9 F (36.6 C) 98 F (36.7 C) (!) 97.5 F (36.4 C)  TempSrc: Oral Oral Oral Oral  SpO2: 99% 97% 99% 100%  Weight:      Height:        General: Pt is alert, awake, not in acute distress Cardiovascular: RRR, S1/S2 +, no rubs, no gallops Respiratory: CTA bilaterally, no wheezing, no rhonchi Abdominal: Soft, NT, ND, bowel sounds + Extremities: no edema, no cyanosis    The results of significant diagnostics from this hospitalization (including imaging, microbiology, ancillary and laboratory) are listed below for reference.     Microbiology: No results found for this or any previous visit (from the past 240 hour(s)).   Labs: BNP (last 3 results) No results for input(s): BNP in the last 8760 hours. Basic Metabolic Panel: Recent Labs  Lab 06/23/19 0403 06/24/19 0605 06/25/19 0517  NA 141 137 140  K 4.2 4.0 4.2  CL 112* 110 109  CO2 22  23 24   GLUCOSE 152* 177* 164*  BUN 26* 28* 31*  CREATININE 0.78 0.85 0.71  CALCIUM 8.8* 8.7* 9.2   Liver Function Tests: Recent Labs  Lab 06/23/19 0403  AST 40  ALT 19  ALKPHOS 78  BILITOT 0.2*  PROT 6.0*  ALBUMIN 3.2*   No results for input(s): LIPASE, AMYLASE in the last 168 hours. No results for input(s): AMMONIA in the last 168 hours. CBC: Recent Labs  Lab 06/24/19 0605 06/25/19 0517  WBC 6.9 7.6  NEUTROABS 4.2 4.7  HGB 8.3* 9.0*  HCT 27.3* 28.8*  MCV 100.4* 98.3  PLT 142* 150   Cardiac Enzymes: No results for input(s): CKTOTAL, CKMB, CKMBINDEX, TROPONINI in the last 168 hours. BNP: Invalid input(s): POCBNP CBG: Recent Labs  Lab 06/24/19 1148 06/24/19 1647 06/24/19 2001 06/25/19 0803 06/25/19 1230  GLUCAP 125* 235* 258* 204* 219*   D-Dimer No results for input(s): DDIMER in the last 72 hours. Hgb A1c Recent Labs    06/24/19 0605  HGBA1C 9.8*   Lipid Profile No results for input(s): CHOL, HDL, LDLCALC, TRIG, CHOLHDL, LDLDIRECT in the last 72 hours. Thyroid function studies No results for input(s): TSH, T4TOTAL, T3FREE, THYROIDAB in the last 72 hours.  Invalid input(s): FREET3 Anemia work up No results for input(s): VITAMINB12, FOLATE, FERRITIN, TIBC, IRON, RETICCTPCT in the last 72 hours. Urinalysis    Component Value Date/Time   COLORURINE STRAW (A) 05/13/2019 2028   APPEARANCEUR HAZY (A) 05/13/2019 2028   LABSPEC 1.024 05/13/2019 2028   PHURINE 6.0 05/13/2019 2028   GLUCOSEU >=500 (A) 05/13/2019 2028   HGBUR SMALL (A) 05/13/2019 2028   BILIRUBINUR NEGATIVE 05/13/2019 2028   BILIRUBINUR negative 03/15/2018 1049   BILIRUBINUR negative 12/26/2017 0941   KETONESUR NEGATIVE 05/13/2019 2028   PROTEINUR 30 (A) 05/13/2019 2028   UROBILINOGEN 0.2 03/15/2018 1049   UROBILINOGEN 0.2 12/08/2010 1613   NITRITE POSITIVE (A) 05/13/2019 2028   LEUKOCYTESUR LARGE (A) 05/13/2019 2028   Sepsis Labs Invalid input(s): PROCALCITONIN,  WBC,   LACTICIDVEN Microbiology No results found for this or any previous visit (from the past 240 hour(s)).   Time coordinating discharge: Over 30 minutes  SIGNED:   Vicenta Dunning, MD  [facility] 06/25/2019, 12:55 PM Pager   If 7PM-7AM, please contact night-coverage [email or web address] Password [email or web address]

## 2019-06-26 ENCOUNTER — Telehealth: Payer: Self-pay

## 2019-06-26 NOTE — Telephone Encounter (Signed)
Transition Care Management Follow-up Telephone Call Date of discharge and from where: 06/25/2019,  [place]   Attempted to contact patient # [phone], number has been disconnected.   As per hospital case manager, patient is homeless.  He has an appt scheduled at [facility] on 07/09/2019.

## 2019-06-27 ENCOUNTER — Telehealth: Payer: Self-pay

## 2019-06-27 NOTE — Telephone Encounter (Signed)
Transition Care Management Follow-up Telephone Call Date of discharge and from where: 06/25/2019, [facility]   Call placed to # [phone], phone remains disconnected Patient has scheduled appt at [facility] on 07/09/2019

## 2019-07-08 NOTE — Progress Notes (Deleted)
Patient ID: Rodney Barber, male   DOB: Jul 09, 1966, 53 y.o.   MRN: [id number] After hospitalization 10/6-11/18/2020.  From discharge summary: Brief/Interim Summary:  53 year old male with history of insulin-dependent diabetes mellitus hypertension, chronic pancreatitis, smoker, recent prolonged admission in the hospital complicated by initial suicidal ideation later cleared, now homeless, found to have MSSA bacteremia, currently on antibiotics IV, due to his IV drug use he is required to stay in the hospital to complete his IV antibiotic treatment. Patient was admitted to the hospital with hyperglycemia, severe hypokalemia as well UTI like presentation.  Urine culture obtained on admission grew MSSA. Culture taken from neck abscess also grew MSSA. Blood culture negative. 10/8, CT scanning soft tissue neck showed multiloculated abscess at C1-C4 level as well as osteomyelitis in the lamina of C3-C4 level Per ID recommendation, antibiotic was switched to IV Ancef. Remains in the hospital to finish antibiotics, until 06/25/2019 due to history of IV drug abuse, unsafe to discharge with PICC line.  Patient finished his 6-week course of cefazolin in the hospital on 06/25/2019.  As per infectious disease specialist Dr. Tommy Medal, he was prescribed Keflex 200 mg 4 times a day for another 1 month and to follow-up with him in ID clinic.  During his stay, his blood pressure and blood sugar were elevated.  He was initially treated with insulin, however given concern for compliance and cost, he was switched to oral glipizide and Metformin.  Lisinopril dose was increased to 20 mg.  He has been counseled to quit substance abuse including smoking.  On November 18, he was doing fine and was discharged home to follow-up with PCP and ID.  During his stay  MSSA UTI, MSSA left neck abscess, C-spine osteomyelitis, MSSA bacteremia  -Urine culture had MSSA, left neck aspiration also had MSSA, blood culture on  May 16, 2019 showed MSSA. TEE showed no vegetation Patient is currently on IV Ancef for 6 weeks Per ID we will continue cefazolin until November 18. Discussed with Dr. Drucilla Schmidt infectious disease. He recommends continuing Keflex 5 mg 4 times daily for at least 1 month and follow-up with him as outpatient   Diabetes mellitus, brittle, uncontrolled IDDM with hypoglycemia and hyperglycemia  Uncontrolled his hemoglobin was 17.7 and July 2020 current hemoglobin is 14.3. Recheck A1c. We will switch insulin to oralMetformin and glipizidedue to concern for compliance and cost.   Essential hypertension Better controlled. Continue Norvasc and lisinopril with IV hydralazine as needed  History of depression Denies any suicidal ideation Continue Lexapro   History of smoking Continue nicotine patch  Homeless Case management assisting with housing  Protein calorie malnutrition, moderate to severe - His BMI is 17.3 registered dietitian is following   Discharge Diagnoses:  Principal Problem:   Hypokalemia Active Problems:   Uncontrolled type 2 diabetes mellitus ([place])   UTI (urinary tract infection)   Hyperglycemia   Tobacco use   Abnormal blood electrolyte level    Discharge Instructions      Discharge Instructions    Diet - low sodium heart healthy   Complete by: As directed    1800 kcal aada diet, low lipids   Increase activity slowly   Complete by: As directed      Allergies as of 06/25/2019   No Known Allergies        Medication List    STOP taking these medications   insulin detemir 100 UNIT/ML injection Commonly known as: Levemir   lipase/protease/amylase 12000 units Cpep capsule Commonly known as: CREON  Replaced by: Zenpep 3000-14000 units Cpep     TAKE these medications   amLODipine 5 MG tablet Commonly known as: NORVASC Take 1 tablet (5 mg total) by mouth daily.   atorvastatin 20 MG tablet Commonly known as:  LIPITOR Take 1 tablet (20 mg total) by mouth daily at 6 PM.   cephALEXin 500 MG capsule Commonly known as: KEFLEX Take 1 capsule (500 mg total) by mouth 4 (four) times daily.   escitalopram 10 MG tablet Commonly known as: Lexapro Take 0.5 tablets (5 mg total) by mouth daily.   folic acid 1 MG tablet Commonly known as: FOLVITE Take 1 tablet (1 mg total) by mouth daily.   glipiZIDE 10 MG 24 hr tablet Commonly known as: GLUCOTROL XL Take 1 tablet (10 mg total) by mouth daily with breakfast. Start taking on: June 26, 2019   lactulose 10 GM/15ML solution Commonly known as: CHRONULAC Take 30 mLs (20 g total) by mouth 2 (two) times daily.   lisinopril 20 MG tablet Commonly known as: ZESTRIL Take 1 tablet (20 mg total) by mouth daily. Start taking on: June 26, 2019   magnesium oxide 400 (241.3 Mg) MG tablet Commonly known as: MAG-OX Take 1 tablet (400 mg total) by mouth 2 (two) times daily.   metFORMIN 500 MG 24 hr tablet Commonly known as: GLUCOPHAGE-XR Take 1 tablet (500 mg total) by mouth daily with breakfast. Start taking on: June 26, 2019   multivitamin with minerals Tabs tablet Take 1 tablet by mouth daily.   nicotine 14 mg/24hr patch Commonly known as: NICODERM CQ - dosed in mg/24 hours Place 1 patch (14 mg total) onto the skin daily. Start taking on: June 26, 2019   thiamine 100 MG tablet Take 1 tablet (100 mg total) by mouth daily.   True Metrix Blood Glucose Test test strip Generic drug: glucose blood Use as instructed   TRUEplus Lancets 28G Misc uad   TRUEresult Blood Glucose w/Device Kit uad   Zenpep 3000-14000 units Cpep Generic drug: Pancrelipase (Lip-Prot-Amyl) Take 1 Units by mouth 3 (three) times daily. Replaces: lipase/protease/amylase 12000 units Cpep capsule         Follow-up Information    [facility]. Schedule an appointment as soon as possible for a visit.   Contact  information: [place] [id number] [facility].   Contact information: [address] pharmacy go directly there @ d/c for meds.         No Known Allergies  Consultations:     Procedures/Studies:   Imaging Results  No results found.      Subjective:   Discharge Exam:     Vitals:   06/25/19 0510 06/25/19 1247  BP: (!) 167/88 (!) 150/91  Pulse: 79 81  Resp:  20  Temp: 98 F (36.7 C) (!) 97.5 F (36.4 C)  SpO2: 99% 100%         Vitals:   06/24/19 1330 06/24/19 2038 06/25/19 0510 06/25/19 1247  BP: (!) 148/82 (!) 143/77 (!) 167/88 (!) 150/91  Pulse: 72 80 79 81  Resp: _0 Temp: 98 F (36.7 C) 97.9 F (36.6 C) 98 F (36.7 C) (!) 97.5 F (36.4 C)  TempSrc: Oral Oral Oral Oral  SpO2: 99% 97% 99% 100%  Weight:      Height:        General: Pt is  alert, awake, not in acute distress Cardiovascular: RRR, S1/S2 +, no rubs, no gallops Respiratory: CTA bilaterally, no wheezing, no rhonchi Abdominal: Soft, NT, ND, bowel sounds + Extremities: no edema, no cyanosis    The results of significant diagnostics from this hospitalization (including imaging, microbiology, ancillary and laboratory) are listed below for reference.     Microbiology: No results found for this or any previous visit (from the past 240 hour(s)).   Labs: BNP (last 3 results) Recent Labs (within last 365 days)  No results for input(s): BNP in the last 8760 hours.   Basic Metabolic Panel: Last Labs        Recent Labs  Lab 06/23/19 0403 06/24/19 0605 06/25/19 0517  NA 141 137 140  K 4.2 4.0 4.2  CL 112* 110 109  CO2 _0 GLUCOSE 152* 177* 164*  BUN 26* 28* 31*  CREATININE 0.78 0.85 0.71  CALCIUM 8.8* 8.7* 9.2     Liver Function Tests: Last Labs      Recent Labs  Lab 06/23/19 0403  AST 40  ALT 19  ALKPHOS 78  BILITOT 0.2*   PROT 6.0*  ALBUMIN 3.2*     Last Labs   No results for input(s): LIPASE, AMYLASE in the last 168 hours.   Last Labs   No results for input(s): AMMONIA in the last 168 hours.   CBC: Last Labs       Recent Labs  Lab 06/24/19 0605 06/25/19 0517  WBC 6.9 7.6  NEUTROABS 4.2 4.7  HGB 8.3* 9.0*  HCT 27.3* 28.8*  MCV 100.4* 98.3  PLT 142* 150     Cardiac Enzymes: Last Labs   No results for input(s): CKTOTAL, CKMB, CKMBINDEX, TROPONINI in the last 168 hours.   BNP: Last Labs   Invalid input(s): POCBNP   CBG: Last Labs          Recent Labs  Lab 06/24/19 1148 06/24/19 1647 06/24/19 2001 06/25/19 0803 06/25/19 1230  GLUCAP 125* 235* 258* 204* 219*     D-Dimer Recent Labs (last 2 labs)   No results for input(s): DDIMER in the last 72 hours.   Hgb A1c Recent Labs (last 2 labs)      Recent Labs    06/24/19 0605  HGBA1C 9.8*     Lipid Profile Recent Labs (last 2 labs)   No results for input(s): CHOL, HDL, LDLCALC, TRIG, CHOLHDL, LDLDIRECT in the last 72 hours.   Thyroid function studies  Recent Labs (last 2 labs)   No results for input(s): TSH, T4TOTAL, T3FREE, THYROIDAB in the last 72 hours.  Invalid input(s): FREET3   Anemia work up [employer] (last 2 labs)   No results for input(s): VITAMINB12, FOLATE, FERRITIN, TIBC, IRON, RETICCTPCT in the last 72 hours.   Urinalysis Labs (Brief)          Component Value Date/Time   COLORURINE STRAW (A) 05/13/2019 2028   APPEARANCEUR HAZY (A) 05/13/2019 2028   LABSPEC 1.024 05/13/2019 2028   PHURINE 6.0 05/13/2019 2028   GLUCOSEU >=500 (A) 05/13/2019 2028   HGBUR SMALL (A) 05/13/2019 2028   BILIRUBINUR NEGATIVE 05/13/2019 2028   BILIRUBINUR negative 03/15/2018 1049   BILIRUBINUR negative 12/26/2017 [place] 05/13/2019 2028   PROTEINUR 30 (A) 05/13/2019 2028   UROBILINOGEN 0.2 03/15/2018 1049   UROBILINOGEN 0.2 12/08/2010 1613   NITRITE POSITIVE (A) 05/13/2019 2028    LEUKOCYTESUR LARGE (A) 05/13/2019 2028     Sepsis Labs Last  Labs   Invalid input(s): PROCALCITONIN,  WBC,  LACTICIDVEN   Microbiology No results found for this or any previous visit (from the past 240 hour(s)).   Time coordinating discharge: Over 30 minutes  SIGNED:

## 2019-07-09 ENCOUNTER — Inpatient Hospital Stay: Payer: Self-pay

## 2019-07-15 ENCOUNTER — Ambulatory Visit: Payer: Self-pay | Admitting: Infectious Disease

## 2019-07-28 ENCOUNTER — Other Ambulatory Visit: Payer: Self-pay

## 2019-07-28 DIAGNOSIS — Z20822 Contact with and (suspected) exposure to covid-19: Secondary | ICD-10-CM

## 2019-07-29 LAB — NOVEL CORONAVIRUS, NAA: SARS-CoV-2, NAA: NOT DETECTED

## 2019-09-09 ENCOUNTER — Other Ambulatory Visit: Payer: Self-pay

## 2019-09-09 ENCOUNTER — Inpatient Hospital Stay (HOSPITAL_COMMUNITY)
Admission: EM | Admit: 2019-09-09 | Discharge: 2019-09-11 | DRG: 637 | Disposition: A | Discharge status: Home / Self Care | Payer: Self-pay | Attending: Family Medicine | Admitting: Family Medicine

## 2019-09-09 ENCOUNTER — Encounter (HOSPITAL_COMMUNITY): Payer: Self-pay

## 2019-09-09 DIAGNOSIS — T383X6A Underdosing of insulin and oral hypoglycemic [antidiabetic] drugs, initial encounter: Secondary | ICD-10-CM | POA: Diagnosis present

## 2019-09-09 DIAGNOSIS — Z681 Body mass index (BMI) 19 or less, adult: Secondary | ICD-10-CM

## 2019-09-09 DIAGNOSIS — K838 Other specified diseases of biliary tract: Secondary | ICD-10-CM | POA: Diagnosis present

## 2019-09-09 DIAGNOSIS — E860 Dehydration: Secondary | ICD-10-CM | POA: Diagnosis present

## 2019-09-09 DIAGNOSIS — I10 Essential (primary) hypertension: Secondary | ICD-10-CM | POA: Diagnosis present

## 2019-09-09 DIAGNOSIS — Z833 Family history of diabetes mellitus: Secondary | ICD-10-CM

## 2019-09-09 DIAGNOSIS — Z590 Homelessness: Secondary | ICD-10-CM

## 2019-09-09 DIAGNOSIS — R531 Weakness: Secondary | ICD-10-CM

## 2019-09-09 DIAGNOSIS — R7401 Elevation of levels of liver transaminase levels: Secondary | ICD-10-CM | POA: Diagnosis present

## 2019-09-09 DIAGNOSIS — N179 Acute kidney failure, unspecified: Secondary | ICD-10-CM | POA: Diagnosis present

## 2019-09-09 DIAGNOSIS — E1100 Type 2 diabetes mellitus with hyperosmolarity without nonketotic hyperglycemic-hyperosmolar coma (NKHHC): Principal | ICD-10-CM | POA: Diagnosis present

## 2019-09-09 DIAGNOSIS — Z20822 Contact with and (suspected) exposure to covid-19: Secondary | ICD-10-CM | POA: Diagnosis present

## 2019-09-09 DIAGNOSIS — Z66 Do not resuscitate: Secondary | ICD-10-CM | POA: Diagnosis present

## 2019-09-09 DIAGNOSIS — E1165 Type 2 diabetes mellitus with hyperglycemia: Secondary | ICD-10-CM

## 2019-09-09 DIAGNOSIS — R748 Abnormal levels of other serum enzymes: Secondary | ICD-10-CM

## 2019-09-09 DIAGNOSIS — E43 Unspecified severe protein-calorie malnutrition: Secondary | ICD-10-CM | POA: Diagnosis present

## 2019-09-09 DIAGNOSIS — F17210 Nicotine dependence, cigarettes, uncomplicated: Secondary | ICD-10-CM | POA: Diagnosis present

## 2019-09-09 DIAGNOSIS — R739 Hyperglycemia, unspecified: Secondary | ICD-10-CM | POA: Diagnosis present

## 2019-09-09 DIAGNOSIS — Z91128 Patient's intentional underdosing of medication regimen for other reason: Secondary | ICD-10-CM

## 2019-09-09 DIAGNOSIS — E1169 Type 2 diabetes mellitus with other specified complication: Secondary | ICD-10-CM

## 2019-09-09 LAB — URINALYSIS, ROUTINE W REFLEX MICROSCOPIC
Bacteria, UA: NONE SEEN
Bilirubin Urine: NEGATIVE
Glucose, UA: >=500 mg/dL — AB
Ketones, ur: NEGATIVE mg/dL
Nitrite: NEGATIVE
Protein, ur: NEGATIVE mg/dL
Specific Gravity, Urine: 1.025 (ref 1.005–1.030)
pH: 5 (ref 5.0–8.0)

## 2019-09-09 LAB — HEPATIC FUNCTION PANEL
ALT: 164 U/L — ABNORMAL HIGH (ref 0–44)
AST: 172 U/L — ABNORMAL HIGH (ref 15–41)
Albumin: 3.6 g/dL (ref 3.5–5.0)
Alkaline Phosphatase: 555 U/L — ABNORMAL HIGH (ref 38–126)
Bilirubin, Direct: 0.1 mg/dL (ref 0.0–0.2)
Indirect Bilirubin: 0.2 mg/dL — ABNORMAL LOW (ref 0.3–0.9)
Total Bilirubin: 0.3 mg/dL (ref 0.3–1.2)
Total Protein: 6.6 g/dL (ref 6.5–8.1)

## 2019-09-09 LAB — BLOOD GAS, VENOUS
Bicarbonate: 27.7 mmol/L (ref 20.0–28.0)
Patient temperature: 98.6
pCO2, Ven: 55.5 mmHg (ref 44.0–60.0)
pH, Ven: 7.319 (ref 7.250–7.430)
pO2, Ven: 36.8 mmHg (ref 32.0–45.0)

## 2019-09-09 LAB — CBC
HCT: 37.5 % — ABNORMAL LOW (ref 39.0–52.0)
Hemoglobin: 11.9 g/dL — ABNORMAL LOW (ref 13.0–17.0)
MCH: 30.1 pg (ref 26.0–34.0)
MCHC: 31.7 g/dL (ref 30.0–36.0)
MCV: 94.7 fL (ref 80.0–100.0)
Platelets: 194 10*3/uL (ref 150–400)
RBC: 3.96 MIL/uL — ABNORMAL LOW (ref 4.22–5.81)
RDW: 13.4 % (ref 11.5–15.5)
WBC: 6.2 10*3/uL (ref 4.0–10.5)
nRBC: 0 % (ref 0.0–0.2)

## 2019-09-09 LAB — MAGNESIUM: Magnesium: 2.1 mg/dL (ref 1.7–2.4)

## 2019-09-09 LAB — BASIC METABOLIC PANEL
Anion gap: 10 (ref 5–15)
Anion gap: 8 (ref 5–15)
BUN: 29 mg/dL — ABNORMAL HIGH (ref 6–20)
BUN: 25 mg/dL — ABNORMAL HIGH (ref 6–20)
CO2: 25 mmol/L (ref 22–32)
CO2: 27 mmol/L (ref 22–32)
Calcium: 8.8 mg/dL — ABNORMAL LOW (ref 8.9–10.3)
Calcium: 8.9 mg/dL (ref 8.9–10.3)
Chloride: 90 mmol/L — ABNORMAL LOW (ref 98–111)
Chloride: 102 mmol/L (ref 98–111)
Creatinine, Ser: 1.14 mg/dL (ref 0.61–1.24)
Creatinine, Ser: 0.92 mg/dL (ref 0.61–1.24)
GFR calc Af Amer: >60 mL/min (ref >60)
GFR calc Af Amer: >60 mL/min (ref >60)
GFR calc non Af Amer: >60 mL/min (ref >60)
GFR calc non Af Amer: >60 mL/min (ref >60)
Glucose, Bld: 1017 mg/dL (ref 70–99)
Glucose, Bld: 470 mg/dL — ABNORMAL HIGH (ref 70–99)
Potassium: 4.8 mmol/L (ref 3.5–5.1)
Potassium: 4 mmol/L (ref 3.5–5.1)
Sodium: 125 mmol/L — ABNORMAL LOW (ref 135–145)
Sodium: 137 mmol/L (ref 135–145)

## 2019-09-09 LAB — SODIUM, URINE, RANDOM: Sodium, Ur: 21 mmol/L

## 2019-09-09 LAB — CBG MONITORING, ED
Glucose-Capillary: >600 mg/dL (ref 70–99)
Glucose-Capillary: >600 mg/dL (ref 70–99)
Glucose-Capillary: >600 mg/dL (ref 70–99)
Glucose-Capillary: 492 mg/dL — ABNORMAL HIGH (ref 70–99)
Glucose-Capillary: 282 mg/dL — ABNORMAL HIGH (ref 70–99)
Glucose-Capillary: 128 mg/dL — ABNORMAL HIGH (ref 70–99)
Glucose-Capillary: 71 mg/dL (ref 70–99)

## 2019-09-09 LAB — RAPID URINE DRUG SCREEN, HOSP PERFORMED
Amphetamines: NOT DETECTED
Barbiturates: NOT DETECTED
Benzodiazepines: NOT DETECTED
Cocaine: NOT DETECTED
Opiates: NOT DETECTED
Tetrahydrocannabinol: NOT DETECTED

## 2019-09-09 LAB — OSMOLALITY: Osmolality: 310 mOsm/kg — ABNORMAL HIGH (ref 275–295)

## 2019-09-09 LAB — CREATININE, URINE, RANDOM: Creatinine, Urine: 12.27 mg/dL

## 2019-09-09 MED ORDER — ACETAMINOPHEN 325 MG PO TABS: 650.0000 mg | ORAL_TABLET | Freq: Four times a day (QID) | ORAL | Status: DC | PRN

## 2019-09-09 MED ORDER — DEXTROSE-NACL 5-0.45 % IV SOLN: INTRAVENOUS | Status: DC

## 2019-09-09 MED ORDER — ACETAMINOPHEN 650 MG RE SUPP: 650.0000 mg | Freq: Four times a day (QID) | RECTAL | Status: DC | PRN

## 2019-09-09 MED ORDER — SODIUM CHLORIDE 0.45 % IV SOLN
INTRAVENOUS | Status: DC
  Filled 2019-09-09: qty 1000

## 2019-09-09 MED ORDER — NICOTINE 14 MG/24HR TD PT24: 14.0000 mg | MEDICATED_PATCH | Freq: Every day | TRANSDERMAL | Status: DC | PRN

## 2019-09-09 MED ORDER — ENOXAPARIN SODIUM 40 MG/0.4ML ~~LOC~~ SOLN
40.0000 mg | SUBCUTANEOUS | Status: DC
  Administered 2019-09-10 – 2019-09-11 (×2): 40 mg via SUBCUTANEOUS
  Filled 2019-09-09 (×2): qty 0.4

## 2019-09-09 MED ORDER — KCL IN DEXTROSE-NACL 10-5-0.45 MEQ/L-%-% IV SOLN
INTRAVENOUS | Status: DC
  Filled 2019-09-09: qty 1000

## 2019-09-09 MED ORDER — ESCITALOPRAM OXALATE 10 MG PO TABS
5.0000 mg | ORAL_TABLET | Freq: Every day | ORAL | Status: DC
  Administered 2019-09-10 – 2019-09-11 (×2): 5 mg via ORAL
  Filled 2019-09-09 (×2): qty 1

## 2019-09-09 MED ORDER — SODIUM CHLORIDE 0.9% FLUSH: 3.0000 mL | Freq: Two times a day (BID) | INTRAVENOUS | Status: DC

## 2019-09-09 MED ORDER — LACTATED RINGERS IV BOLUS
1000.0000 mL | Freq: Once | INTRAVENOUS | Status: AC
  Administered 2019-09-09: 1000 mL via INTRAVENOUS

## 2019-09-09 MED ORDER — AMLODIPINE BESYLATE 5 MG PO TABS
5.0000 mg | ORAL_TABLET | Freq: Every day | ORAL | Status: DC
  Administered 2019-09-10 – 2019-09-11 (×2): 5 mg via ORAL
  Filled 2019-09-09 (×2): qty 1

## 2019-09-09 MED ORDER — ATORVASTATIN CALCIUM 20 MG PO TABS: 20.0000 mg | ORAL_TABLET | Freq: Every day | ORAL | Status: DC

## 2019-09-09 MED ORDER — INSULIN REGULAR(HUMAN) IN NACL 100-0.9 UT/100ML-% IV SOLN
INTRAVENOUS | Status: DC
  Administered 2019-09-09: 19:00:00 11 [IU]/h via INTRAVENOUS
  Filled 2019-09-09 (×3): qty 100

## 2019-09-09 MED ORDER — SODIUM CHLORIDE 0.9 % IV SOLN: INTRAVENOUS | Status: DC

## 2019-09-09 MED ORDER — LACTATED RINGERS IV BOLUS: 1000.0000 mL | Freq: Once | INTRAVENOUS | Status: DC

## 2019-09-09 MED ORDER — SODIUM CHLORIDE 0.9 % IV BOLUS
1000.0000 mL | INTRAVENOUS | Status: AC
  Administered 2019-09-09: 1000 mL via INTRAVENOUS

## 2019-09-09 MED ORDER — INSULIN ASPART PROT & ASPART (70-30 MIX) 100 UNIT/ML ~~LOC~~ SUSP
10.0000 [IU] | Freq: Once | SUBCUTANEOUS | Status: DC
  Filled 2019-09-09: qty 10

## 2019-09-09 MED ORDER — POTASSIUM CHLORIDE 10 MEQ/100ML IV SOLN
10.0000 meq | INTRAVENOUS | Status: DC
  Administered 2019-09-09: 18:00:00 10 meq via INTRAVENOUS
  Filled 2019-09-09: qty 100

## 2019-09-09 MED ORDER — DEXTROSE 50 % IV SOLN
0.0000 mL | INTRAVENOUS | Status: DC | PRN
  Administered 2019-09-10: 01:00:00 20 mL via INTRAVENOUS
  Filled 2019-09-09: qty 50

## 2019-09-09 NOTE — ED Provider Notes (Signed)
Accepted handoff at shift change from Dr. Maryan Rued.  Please see prior provider note for more detail.   Briefly: Patient is 54 y.o. with history of insulin-dependent diabetes, cardiac arrest, smoker.  He is living in a shelter currently.  Is a history of pain on p.o. diabetic medications glipizide was historically on insulin 2 months ago but was transitioned to glipizide which she states caused GI upset and stopped taking his medications.  He did have progressively fatigued over the past 2 months since that time.   On my review of EMR he was discharged 06/25/2019 noted to be a brittle diabetic with episodes of hypoglycemia and hyperglycemia and was transition to Metformin glipizide due to concern for compliance and cost.  DDX: concern for HHS, DKA due to medication noncompliance  Plan: Follow-up on blood work and plan disposition     Physical Exam  BP 140/84 (BP Location: Left Arm)   Pulse 67   Temp 97.6 F (36.4 C) (Oral)   Resp 14   Ht 5\' 8"  (1.727 m)   Wt 54.4 kg   SpO2 100%   BMI 18.25 kg/m   CONSTITUTIONAL:  well-appearing, NAD NEURO:  Alert and oriented x 3, no focal deficits EYES:  pupils equal and reactive ENT/NECK:  trachea midline, no JVD CARDIO:  reg rate, reg rhythm, well-perfused PULM:  None labored breathing GI/GU:  Abdomen non-distended MSK/SPINE:  No gross deformities, no edema SKIN:  no rash obvious, atraumatic, no ecchymosis  PSYCH:  Appropriate speech and behavior  Alert and oriented to self, place, time and event.  Speech is fluent, clear without dysarthria or dysphasia.  Strength 5/5 in upper/lower extremities   Sensation intact in upper/lower extremities  CN I not tested  CN II grossly intact visual fields bilaterally. Did not visualize posterior eye.  CN III, IV, VI PERRLA and EOMs intact bilaterally  CN V Intact sensation to sharp and light touch to the face  CN VII facial movements symmetric  CN VIII not tested  CN IX, X no uvula deviation,  symmetric rise of soft palate  CN XI 5/5 SCM and trapezius strength bilaterally  CN XII Midline tongue protrusion, symmetric L/R movements    ED Course/Procedures   Clinical Course as of Sep 08 1726  Tue Sep 09, 2019  1726 Corrected sodium is 147.  Glucose 1017 BUN elevated consistent with prerenal AKI.  No leukocytosis.   [WF]    Clinical Course User Index [WF] Tedd Sias, [place]    Procedures  Results for orders placed or performed during the hospital encounter of 62/69/48  Basic metabolic panel  Result Value Ref Range   Sodium 125 (L) 135 - 145 mmol/L   Potassium 4.8 3.5 - 5.1 mmol/L   Chloride 90 (L) 98 - 111 mmol/L   CO2 25 22 - 32 mmol/L   Glucose, Bld 1,017 (HH) 70 - 99 mg/dL   BUN 29 (H) 6 - 20 mg/dL   Creatinine, Ser 1.14 0.61 - 1.24 mg/dL   Calcium 8.8 (L) 8.9 - 10.3 mg/dL   GFR calc non Af Amer >60 >60 mL/min   GFR calc Af Amer >60 >60 mL/min   Anion gap 10 5 - 15  CBC  Result Value Ref Range   WBC 6.2 4.0 - 10.5 K/uL   RBC 3.96 (L) 4.22 - 5.81 MIL/uL   Hemoglobin 11.9 (L) 13.0 - 17.0 g/dL   HCT 37.5 (L) 39.0 - 52.0 %   MCV 94.7 80.0 - 100.0 fL  MCH 30.1 26.0 - 34.0 pg   MCHC 31.7 30.0 - 36.0 g/dL   RDW 45.3 64.6 - 80.3 %   Platelets 194 150 - 400 K/uL   nRBC 0.0 0.0 - 0.2 %  Urinalysis, Routine w reflex microscopic  Result Value Ref Range   Color, Urine STRAW (A) YELLOW   APPearance CLEAR CLEAR   Specific Gravity, Urine 1.025 1.005 - 1.030   pH 5.0 5.0 - 8.0   Glucose, UA >=500 (A) NEGATIVE mg/dL   Hgb urine dipstick SMALL (A) NEGATIVE   Bilirubin Urine NEGATIVE NEGATIVE   Ketones, ur NEGATIVE NEGATIVE mg/dL   Protein, ur NEGATIVE NEGATIVE mg/dL   Nitrite NEGATIVE NEGATIVE   Leukocytes,Ua SMALL (A) NEGATIVE   RBC / HPF 0-5 0 - 5 RBC/hpf   WBC, UA 21-50 0 - 5 WBC/hpf   Bacteria, UA NONE SEEN NONE SEEN  Hepatic function panel  Result Value Ref Range   Total Protein 6.6 6.5 - 8.1 g/dL   Albumin 3.6 3.5 - 5.0 g/dL   AST 212 (H) 15 - 41 U/L    ALT 164 (H) 0 - 44 U/L   Alkaline Phosphatase 555 (H) 38 - 126 U/L   Total Bilirubin 0.3 0.3 - 1.2 mg/dL   Bilirubin, Direct 0.1 0.0 - 0.2 mg/dL   Indirect Bilirubin 0.2 (L) 0.3 - 0.9 mg/dL  Blood gas, venous (at [facility] and [facility], not at [facility])  Result Value Ref Range   pH, Ven 7.319 7.250 - 7.430   pCO2, Ven 55.5 44.0 - 60.0 mmHg   pO2, Ven 36.8 32.0 - 45.0 mmHg   Bicarbonate 27.7 20.0 - 28.0 mmol/L   Acid-Base Excess NOT CALCULATED 0.0 - 2.0 mmol/L   Acid-base deficit NOT CALCULATED 0.0 - 2.0 mmol/L   O2 Saturation NOT CALCULATED %   Patient temperature 98.6   CBG monitoring, ED  Result Value Ref Range   Glucose-Capillary >600 (HH) 70 - 99 mg/dL  CBG monitoring, ED  Result Value Ref Range   Glucose-Capillary >600 (HH) 70 - 99 mg/dL  CBG monitoring, ED  Result Value Ref Range   Glucose-Capillary >600 (HH) 70 - 99 mg/dL   No results found.   MDM   Patient is a 54 year old male with a blood sugar of over 1000 likely due to medication noncompliance of glipizide and Metformin which she was transition to proximally 2 months ago.  Patient states he is never take his medication due to GI upset as result of his medications.  Patient is homeless.  He has benign physical exam.  Abdominal pain, nausea, vomiting, headache or dizziness.  No cough congestion fevers or chills.  No focal weakness.  He is fatigued but able to move all 4 extremities without difficulty.  He is well-appearing.  I discussed this case with my attending physician who cosigned this note including patient's presenting symptoms, physical exam, and planned diagnostics and interventions. Attending physician stated agreement with plan or made changes to plan which were implemented.   Attending physician assessed patient at bedside.  Patient has blood sugar of 1017 BUN is elevated consistent with prerenal AKI.  Elevated ALP, AST, ALT.  He has no abdominal pain indicate biliary disease.  These may require further trending in  hospital.  Bilirubin is within normal limits.  UA is unremarkable apart from glucose small leukocytes and small hemoglobin.  No leukocytosis.  Discussed case with Dr. Arlean Hopping of internal medicine who will admit patient for continued care.  Appreciate his expert consultation  and care of patient.    Solon Augusta [place], [place] 09/09/19 1849    Glynn Octave, MD 09/09/19 Loleta Books, MD 09/10/19 1113

## 2019-09-09 NOTE — ED Provider Notes (Signed)
[facility] DEPT Provider Note   CSN: [id number] Arrival date & time: 09/09/19  1520     History Chief Complaint  Patient presents with  . Hyperglycemia    Rodney Barber is a 54 y.o. male.  Patient is a 54 year old male with a history of diabetes, hypertension, pancreatitis who is presenting today due to worsening generalized weakness, occasional nausea and elevated blood sugar.  Patient states he has been off of diabetes medication for approximately 1 month.  He reports that he was changed over to oral therapy and it caused diarrhea which he could not handle and so he just stopped taking the medication.  He is also reported weight loss over the last month.  Patient for the last 2 months has been living in a shelter and reports the food is terrible so he has not been eating much.  He denies any abdominal pain, fever, chest pain or cough.  No shortness of breath.  Other than polyuria no dysuria.  The history is provided by the patient.  Hyperglycemia Blood sugar level PTA:  >600 Severity:  Severe Onset quality:  Gradual Timing:  Constant Progression:  Worsening Chronicity:  Chronic Current diabetic treatments: currently not taking meds for last month. Context: noncompliance   Context: not recent illness   Associated symptoms: increased thirst, nausea, polyuria and weight change   Associated symptoms: no abdominal pain, no blurred vision, no chest pain, no dysuria, no fatigue, no fever, no shortness of breath and no vomiting        Past Medical History:  Diagnosis Date  . Diabetes mellitus   . Hypertension   . Pancreatitis     Patient Active Problem List   Diagnosis Date Noted  . UTI (urinary tract infection) 05/14/2019  . Hyperglycemia 05/14/2019  . Tobacco use 05/14/2019  . Abnormal blood electrolyte level 05/14/2019  . Suicide attempt ([place]) 02/26/2019  . Acetaminophen toxicity   . Goals of care, counseling/discussion   . Palliative  care by specialist   . Shock liver 02/18/2019  . Hyperosmolar non-ketotic state in patient with type 2 diabetes mellitus ([place]) 02/18/2019  . Alcohol use 02/18/2019  . Substance use disorder 02/18/2019  . AKI (acute kidney injury) ([place])   . Altered behavior   . Altered mental status 12/02/2018  . Abnormal liver function 12/02/2018  . AMS (altered mental status) 12/02/2018  . Cough 12/02/2018  . Uncontrolled type 2 diabetes mellitus ([place]) 03/15/2018  . Urine test positive for microalbuminuria 07/12/2017  . Type 2 diabetes mellitus with other specified complication ([place]) 82/42/3536  . Liver lesion 07/22/2013  . 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 Diabetes ([place]) 07/18/2013  . Hypokalemia 07/11/2013  . Protein-calorie malnutrition, severe ([place]) 07/11/2013  . Chronic pancreatitis ([place]) 07/10/2013  . 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 Diabetes mellitus ([place]) 07/10/2013  . Weight loss 07/10/2013  . Diarrhea 07/10/2013  . Liver lesion, right lobe 07/10/2013    Past Surgical History:  Procedure Laterality Date  . ERCP  06/27/2011   Procedure: ENDOSCOPIC RETROGRADE CHOLANGIOPANCREATOGRAPHY (ERCP);  Surgeon: Jeryl Columbia, MD;  Location: [facility];  Service: Endoscopy;  Laterality: N/A;  . ERCP W/ PLASTIC STENT PLACEMENT  03/2011  . IR US GUIDE BX ASP/DRAIN  05/16/2019  . TEE WITHOUT CARDIOVERSION N/A 05/20/2019   Procedure: TRANSESOPHAGEAL ECHOCARDIOGRAM (TEE);  Surgeon: [name], MD;  Location: [facility] ENDOSCOPY;  Service: Cardiovascular;  Laterality: N/A;       Family History  Problem Relation  Age of Onset  . Diabetes Mother   . Diabetes Brother     Social History   Tobacco Use  . Smoking status: Current Every Day Smoker    Packs/day: 0.50    Years: 30.00    Pack years: 15.00    Types: Cigarettes  . Smokeless tobacco: Never Used  . Tobacco comment: 6 cigarett /day  Substance Use Topics  . Alcohol use: No  . Drug use: Not Currently     Types: "Crack" cocaine, Other-see comments, Cocaine    Comment: last smoked crack 12-16-16    Home Medications Prior to Admission medications   Medication Sig Start Date End Date Taking? Authorizing Provider  amLODipine (NORVASC) 5 MG tablet Take 1 tablet (5 mg total) by mouth daily. 06/25/19 09/09/19  Vicenta Dunning, MD  atorvastatin (LIPITOR) 20 MG tablet Take 1 tablet (20 mg total) by mouth daily at 6 PM. 06/25/19 09/09/19  Sheth, Devam P, MD  Blood Glucose Monitoring Suppl (TRUERESULT BLOOD GLUCOSE) w/Device KIT uad 06/25/19   Sheth, Devam P, MD  escitalopram (LEXAPRO) 10 MG tablet Take 0.5 tablets (5 mg total) by mouth daily. 06/25/19 09/09/19  Vicenta Dunning, MD  folic acid (FOLVITE) 1 MG tablet Take 1 tablet (1 mg total) by mouth daily. 06/25/19   Sheth, Vickii Chafe, MD  glipiZIDE (GLUCOTROL XL) 10 MG 24 hr tablet Take 1 tablet (10 mg total) by mouth daily with breakfast. 06/26/19 09/09/19  Sheth, Devam P, MD  glucose blood (TRUE METRIX BLOOD GLUCOSE TEST) test strip Use as instructed 06/25/19   Sheth, Devam P, MD  lactulose (CHRONULAC) 10 GM/15ML solution Take 30 mLs (20 g total) by mouth 2 (two) times daily. Patient not taking: Reported on 09/09/2019 03/06/19   Aline August, MD  lisinopril (ZESTRIL) 20 MG tablet Take 1 tablet (20 mg total) by mouth daily. 06/26/19 09/09/19  Vicenta Dunning, MD  magnesium oxide (MAG-OX) 400 (241.3 Mg) MG tablet Take 1 tablet (400 mg total) by mouth 2 (two) times daily. 06/25/19   Vicenta Dunning, MD  metFORMIN (GLUCOPHAGE-XR) 500 MG 24 hr tablet Take 1 tablet (500 mg total) by mouth daily with breakfast. 06/26/19   Sheth, Devam P, MD  nicotine (NICODERM CQ - DOSED IN MG/24 HOURS) 14 mg/24hr patch Place 1 patch (14 mg total) onto the skin daily. Patient not taking: Reported on 09/09/2019 06/26/19   Vicenta Dunning, MD  TRUEplus Lancets 28G MISC uad 06/25/19   Vicenta Dunning, MD    Allergies    Patient has no known allergies.  Review of Systems   Review of Systems    Constitutional: Negative for fatigue and fever.  Eyes: Negative for blurred vision.  Respiratory: Negative for shortness of breath.   Cardiovascular: Negative for chest pain.  Gastrointestinal: Positive for nausea. Negative for abdominal pain and vomiting.  Endocrine: Positive for polydipsia and polyuria.  Genitourinary: Negative for dysuria.  All other systems reviewed and are negative.   Physical Exam Updated Vital Signs BP 140/84 (BP Location: Left Arm)   Pulse 67   Temp 97.6 F (36.4 C) (Oral)   Resp 14   Ht 5' 8"  (1.727 m)   Wt 54.4 kg   SpO2 100%   BMI 18.25 kg/m   Physical Exam Vitals and nursing note reviewed.  Constitutional:      General: He is not in acute distress.    Appearance: He is well-developed and underweight.  HENT:     Head: Normocephalic and  atraumatic.     Mouth/Throat:     Mouth: Mucous membranes are dry.  Eyes:     Conjunctiva/sclera: Conjunctivae normal.     Pupils: Pupils are equal, round, and reactive to light.  Cardiovascular:     Rate and Rhythm: Normal rate and regular rhythm.     Heart sounds: No murmur.  Pulmonary:     Effort: Pulmonary effort is normal. No respiratory distress.     Breath sounds: Normal breath sounds. No wheezing or rales.  Abdominal:     General: There is no distension.     Palpations: Abdomen is soft.     Tenderness: There is no abdominal tenderness. There is no guarding or rebound.  Musculoskeletal:        General: No tenderness. Normal range of motion.     Cervical back: Normal range of motion and neck supple.  Skin:    General: Skin is warm and dry.     Capillary Refill: Capillary refill takes 2 to 3 seconds.     Findings: No erythema or rash.     Comments: Poor skin turgor  Neurological:     General: No focal deficit present.     Mental Status: He is alert and oriented to person, place, and time. Mental status is at baseline.  Psychiatric:        Behavior: Behavior normal.     ED Results /  Procedures / Treatments   Labs (all labs ordered are listed, but only abnormal results are displayed) Labs Reviewed  CBG MONITORING, ED - Abnormal; Notable for the following components:      Result Value   Glucose-Capillary >600 (*)    All other components within normal limits  CBG MONITORING, ED - Abnormal; Notable for the following components:   Glucose-Capillary >600 (*)    All other components within normal limits  BASIC METABOLIC PANEL  CBC  URINALYSIS, ROUTINE W REFLEX MICROSCOPIC  HEPATIC FUNCTION PANEL  BLOOD GAS, VENOUS    EKG None  Radiology No results found.  Procedures Procedures (including critical care time)  Medications Ordered in ED Medications  lactated ringers bolus 1,000 mL (has no administration in time range)    ED Course  I have reviewed the triage vital signs and the nursing notes.  Pertinent labs & imaging results that were available during my care of the patient were reviewed by me and considered in my medical decision making (see chart for details).    MDM Rules/Calculators/A&P                      54 year old male with a history of diabetes who has not been on medication for approximately 1 month presenting today with generalized weakness.  Patient's blood sugars greater than 600.  Vital signs are within normal limits and patient is not having any significant symptoms concerning for DKA.  Mental status is normal.  Concern for severe hyperglycemia and dehydration.  Patient's labs are pending.  Patient given IV fluids.  Final Clinical Impression(s) / ED Diagnoses Final diagnoses:  None    Rx / DC Orders ED Discharge Orders    None       Blanchie Dessert, MD 09/09/19 1652

## 2019-09-09 NOTE — ED Notes (Signed)
Date and time results received: 09/09/19 5:24 PM  (use smartphrase ".now" to insert current time)  Test: Glucose Critical Value: 1017  Name of Provider Notified: [place]  Orders Received? Or Actions Taken?: Orders Received - See Orders for details

## 2019-09-09 NOTE — H&P (Signed)
History and Physical    PLEASE NOTE THAT DRAGON DICTATION SOFTWARE WAS USED IN THE CONSTRUCTION OF THIS NOTE.   Rodney Barber TZG:[id number] DOB: 1966-04-04 DOA: 09/09/2019  PCP: Patient, No Pcp Per Patient coming from: home   I have personally briefly reviewed patient's old medical records in [place]  Chief Complaint: Generalized weakness  HPI: Rodney Barber is a 54 y.o. male with medical history significant for type 2 diabetes mellitus, essential hypertension, chronic tobacco abuse, who is admitted to [facility] on 09/09/2019 with hyperglycemic hyperosmolar nonketotic state after presenting from home to [facility] Emergency Department complaining of generalized weakness.   The patient reports 3 to 4 days of generalized weakness in the absence of any acute focal weakness or acute focal neurologic deficits.  Denies any associated acute numbness, paresthesias, change in vision, diplopia, slurred speech, facial droop, dysphagia, or vertigo.  He notes associated polyuria over the last week, but denies any associated dysuria or gross hematuria.  Denies any recent chest pain, shortness of breath, palpitations, diaphoresis, or nausea/vomiting.  Denies any associated subjective fever, chills, rigors, or generalized myalgias. Denies any recent headache, neck stiffness, rhinitis, rhinorrhea, sore throat, cough, abdominal pain, diarrhea, or rash. No recent traveling or known COVID-19 exposures.   Per chart review, at the time of a prior hospitalization, the patient's home insulin was discontinued given findings of hypoglycemia.  In its place, the patient was started on Metformin and glipizide.  However, following initiation of these 2 oral hypoglycemic agents, the patient reports that he developed mild abdominal discomfort as well as diarrhea, prompting the patient to completely discontinue both his Metformin glipizide approximately 1 month ago, without interval resumption  of insulin or addition of another oral hypoglycemic agent.  Additionally, he acknowledges that he has been checking his blood sugars very infrequently following aforementioned discontinuation of Metformin and glipizide 1 month ago.   ED Course:  Vital signs in the ED were notable for the following: Temperature max 97.6; heart rate 67-81; blood pressure 140/84; respiratory rate 14-16; oxygen saturation 99 to 100% on room air.  Labs were notable for the following: CMP notable for the following: Sodium 125, correcting to approximately 139 when taking into account hyperglycemia, potassium 4.8, bicarbonate 25, anion gap 10, BUN 29, creatinine 1.14 relative to most recent prior creatinine data point of 0.71 on 06/25/2019, BUN to creatinine ratio 25.4, glucose 1017, alkaline phosphatase 555 compared to 78 on 06/22/2020, AST 172 compared to 40 on 06/22/2020, ALT 164 compared to 19 on 06/22/2020, and total bilirubin 0.3.  VBG showed 7.319/55.5.  CBC notable for white blood cell count of 6200.  Urinalysis showed 21-50 white blood cells, nitrate negative, small leukocyte esterase, no bacteria, specific gravity 1.025, and was negative for ketones.  Nasopharyngeal COVID-19 PCR was obtained, with result currently pending.  While in the ED, the following were administered: Normal saline x1 L bolus, lactated Ringer's x1 L bolus, followed by initiation of normal saline running at 75 cc/h, and potassium chloride 20 mEq IV over 2 hours x 2, in addition to initiation of insulin drip.     Review of Systems: As per HPI otherwise 10 point review of systems negative.   Past Medical History:  Diagnosis Date  . Diabetes mellitus   . Hypertension   . Pancreatitis     Past Surgical History:  Procedure Laterality Date  . ERCP  06/27/2011   Procedure: ENDOSCOPIC RETROGRADE CHOLANGIOPANCREATOGRAPHY (ERCP);  Surgeon: Jeryl Columbia, MD;  Location:  [facility] ENDOSCOPY;  Service: Endoscopy;  Laterality: N/A;  . ERCP W/ PLASTIC STENT  PLACEMENT  03/2011  . IR US GUIDE BX ASP/DRAIN  05/16/2019  . TEE WITHOUT CARDIOVERSION N/A 05/20/2019   Procedure: TRANSESOPHAGEAL ECHOCARDIOGRAM (TEE);  Surgeon: [name], MD;  Location: [facility] ENDOSCOPY;  Service: Cardiovascular;  Laterality: N/A;    Social History:  reports that he has been smoking cigarettes. He has a 15.00 pack-year smoking history. He has never used smokeless tobacco. He reports previous drug use. Drugs: "Crack" cocaine, Other-see comments, and Cocaine. He reports that he does not drink alcohol.   No Known Allergies  Family History  Problem Relation Age of Onset  . Diabetes Mother   . Diabetes Brother      Prior to Admission medications   Medication Sig Start Date End Date Taking? Authorizing Provider  amLODipine (NORVASC) 5 MG tablet Take 1 tablet (5 mg total) by mouth daily. 06/25/19 09/09/19  Vicenta Dunning, MD  atorvastatin (LIPITOR) 20 MG tablet Take 1 tablet (20 mg total) by mouth daily at 6 PM. 06/25/19 09/09/19  Sheth, Devam P, MD  Blood Glucose Monitoring Suppl (TRUERESULT BLOOD GLUCOSE) w/Device KIT uad 06/25/19   Sheth, Devam P, MD  escitalopram (LEXAPRO) 10 MG tablet Take 0.5 tablets (5 mg total) by mouth daily. 06/25/19 09/09/19  Vicenta Dunning, MD  folic acid (FOLVITE) 1 MG tablet Take 1 tablet (1 mg total) by mouth daily. 06/25/19   Sheth, Vickii Chafe, MD  glipiZIDE (GLUCOTROL XL) 10 MG 24 hr tablet Take 1 tablet (10 mg total) by mouth daily with breakfast. 06/26/19 09/09/19  Sheth, Devam P, MD  glucose blood (TRUE METRIX BLOOD GLUCOSE TEST) test strip Use as instructed 06/25/19   Sheth, Devam P, MD  lactulose (CHRONULAC) 10 GM/15ML solution Take 30 mLs (20 g total) by mouth 2 (two) times daily. Patient not taking: Reported on 09/09/2019 03/06/19   Aline August, MD  lisinopril (ZESTRIL) 20 MG tablet Take 1 tablet (20 mg total) by mouth daily. 06/26/19 09/09/19  Vicenta Dunning, MD  magnesium oxide (MAG-OX) 400 (241.3 Mg) MG tablet Take 1 tablet (400 mg total)  by mouth 2 (two) times daily. 06/25/19   Vicenta Dunning, MD  metFORMIN (GLUCOPHAGE-XR) 500 MG 24 hr tablet Take 1 tablet (500 mg total) by mouth daily with breakfast. 06/26/19   Sheth, Devam P, MD  nicotine (NICODERM CQ - DOSED IN MG/24 HOURS) 14 mg/24hr patch Place 1 patch (14 mg total) onto the skin daily. Patient not taking: Reported on 09/09/2019 06/26/19   Vicenta Dunning, MD  TRUEplus Lancets 28G MISC uad 06/25/19   Vicenta Dunning, MD     Objective    Physical Exam: Vitals:   09/09/19 1537 09/09/19 1540 09/09/19 1837  BP: 140/84  (!) 167/93  Pulse: 67  70  Resp: 14  16  Temp: 97.6 F (36.4 C)    TempSrc: Oral    SpO2: 100%  100%  Weight:  54.4 kg   Height:  5' 8"  (1.727 m)     General: appears to be stated age; alert, oriented Skin: warm, dry, no rash Head:  AT/[place] Eyes:  PEARL b/l, EOMI Mouth:  Oral mucosa membranes appear dry, normal dentition Neck: supple; trachea midline Heart:  RRR; did not appreciate any M/R/G Lungs: CTAB, did not appreciate any wheezes, rales, or rhonchi Abdomen: + BS; soft, ND, NT Vascular: 2+ pedal pulses b/l; 2+ radial pulses b/l Extremities: no peripheral edema, no muscle  wasting   Labs on Admission: I have personally reviewed following labs and imaging studies  CBC: Recent Labs  Lab 09/09/19 1648  WBC 6.2  HGB 11.9*  HCT 37.5*  MCV 94.7  PLT 976   Basic Metabolic Panel: Recent Labs  Lab 09/09/19 1648  NA 125*  K 4.8  CL 90*  CO2 25  GLUCOSE 1,017*  BUN 29*  CREATININE 1.14  CALCIUM 8.8*   GFR: Estimated Creatinine Clearance: 56.3 mL/min (by C-G formula based on SCr of 1.14 mg/dL). Liver Function Tests: Recent Labs  Lab 09/09/19 1648  AST 172*  ALT 164*  ALKPHOS 555*  BILITOT 0.3  PROT 6.6  ALBUMIN 3.6   No results for input(s): LIPASE, AMYLASE in the last 168 hours. No results for input(s): AMMONIA in the last 168 hours. Coagulation Profile: No results for input(s): INR, PROTIME in the last 168  hours. Cardiac Enzymes: No results for input(s): CKTOTAL, CKMB, CKMBINDEX, TROPONINI in the last 168 hours. BNP (last 3 results) No results for input(s): PROBNP in the last 8760 hours. HbA1C: No results for input(s): HGBA1C in the last 72 hours. CBG: Recent Labs  Lab 09/09/19 1535 09/09/19 1629 09/09/19 1827  GLUCAP >600* >600* >600*   Lipid Profile: No results for input(s): CHOL, HDL, LDLCALC, TRIG, CHOLHDL, LDLDIRECT in the last 72 hours. Thyroid Function Tests: No results for input(s): TSH, T4TOTAL, FREET4, T3FREE, THYROIDAB in the last 72 hours. Anemia Panel: No results for input(s): VITAMINB12, FOLATE, FERRITIN, TIBC, IRON, RETICCTPCT in the last 72 hours. Urine analysis:    Component Value Date/Time   COLORURINE STRAW (A) 09/09/2019 1544   APPEARANCEUR CLEAR 09/09/2019 1544   LABSPEC 1.025 09/09/2019 1544   PHURINE 5.0 09/09/2019 1544   GLUCOSEU >=500 (A) 09/09/2019 1544   HGBUR SMALL (A) 09/09/2019 1544   BILIRUBINUR NEGATIVE 09/09/2019 1544   BILIRUBINUR negative 03/15/2018 1049   BILIRUBINUR negative 12/26/2017 0941   KETONESUR NEGATIVE 09/09/2019 1544   PROTEINUR NEGATIVE 09/09/2019 1544   UROBILINOGEN 0.2 03/15/2018 1049   UROBILINOGEN 0.2 12/08/2010 1613   NITRITE NEGATIVE 09/09/2019 1544   LEUKOCYTESUR SMALL (A) 09/09/2019 1544    Radiological Exams on Admission: No results found.   Assessment/Plan   Rodney Barber is a 54 y.o. male with medical history significant for type 2 diabetes mellitus, essential hypertension, chronic tobacco abuse, who is admitted to [facility] on 09/09/2019 with hyperglycemic hyperosmolar nonketotic state after presenting from home to [facility] Emergency Department complaining of generalized weakness.    Principal Problem:   Hyperosmolar hyperglycemic state (HHS) ([place]) Active Problems:   AKI (acute kidney injury) ([place])   Hyperglycemia without ketosis   Generalized weakness   Dehydration    Transaminitis   #) Hyperosmolar hyperglycemic nonketotic state: In the setting of known type 2 diabetes mellitus, presenting blood sugar noted to exceed 1000 with no evidence of metabolic acidosis, elevated anion gap, or ketones per urinalysis.  Overall, no evidence of DKA.  Presenting HHNK appears to be the basis of suboptimal compliance with outpatient oral hypoglycemic regimen, which the patient acknowledges that he completely stopped taking approximately 1 month ago in the absence of any interval replacement of oral hypoglycemic agent.  Over that time he was also not on any exogenous insulin.  No overt concomitant contribution from any underlying infectious process.  After receiving 2 L of bolused IV fluid in the ED, he was transitioned to normal sleep at 75 cc/h and was initiated on insulin drip.  After serum  sodium level corrected following 2 L of bolused IV fluid in the ED, and given presenting serum potassium level of 4.8, will transition IV fluids to half-normal saline with 10 mEq/L KCl at 100 cc/h given anticipation of further intracellular shift of potassium in the setting of ongoing insulin drip.  will change IV fluid to D5 half-normal saline with 10 mEq/L of KCl once CBG less than 250.   Plan: Half-normal saline with 10 mEq/L KCl at 100 cc/h, with plan to change IV fluids to D5 half-normal saline with 10 mEq/L of KCl once CBG less than 250.  Every hour Accu-Cheks.  Every 4 hours BMP.  Add on serum magnesium level.  Monitor strict I's and O's.  Check hemoglobin A1c.  Repeat CBC in the morning.  Counseled the patient on the importance of improved compliance with outpatient diuretic regimen.  We will plan to continue insulin drip overnight.  Can consider diabetic educator consultation.  Monitor on telemetry. Will follow for result of nasopharyngeal COVID-19 PCR performed in the emergency department today.      #) Acute kidney injury: In the setting of baseline creatinine of 0.6-0.7, with most  recent prior creatinine data point noted to be 0.71 on 06/25/2019, this evening's labs reflect presenting creatinine of 1.14.  This appears to be prerenal in nature on the basis of intravascular depletion stemming from dehydration relating to presenting hyperglycemia resulting in auto diuresis via ensuing osmolar gradient.  While the patient is prescribed lisinopril at home, he acknowledges poor compliance in taking this medication.  Urinalysis, with results as above, without evidence of significant urinary casts.   Plan: Work-up and management of HHNK, including IV fluids, as above.  Monitor strict I's and O's and attempt to avoid nephrotoxic agents.  Repeat BMP in the morning.  Hold home ACE inhibitor.  Add on random urine sodium as well as random urine creatinine.      #) Generalized weakness: The patient presents complaining of generalized weakness in the absence of any acute focal neurologic deficits.  This appears to be multifactorial nature, with contributions from suboptimal management of diabetes on the basis of noncompliance as well as resultant dehydration, as further described above.  Plan: Work-up and management of HHNK as well as dehydration, including IV fluids, as further described above.  Check TSH.  I have also placed a physical therapy consult to occur in the morning.      #) Acute transaminitis: Presenting CBC reveals acute transaminitis in an apparent noncholestatic pattern.  Etiology not completely clear although differential includes shock liver in the setting of significant presenting dehydration, although shock liver is typically associated with much higher transaminases relative to those seen at the time of today's presentation.  Differential also includes toxic transaminitis given the patient's report of prior cocaine use.  Denies any recent abdominal discomfort, nausea, vomiting, or diarrhea.  Regardless, will check for acute viral hepatitis.  Plan: Check GGT, lipase.   Check INR to evaluate hepatic synthetic function.  Add on serum ethanol level to labs collected in the ED.  Check urinary drug screen.  Check acute viral hepatitis panel.  Repeat CMP in the morning.  Hold outpatient statin.  Consider ultrasound of the right upper quadrant.       #) Asymptomatic pyuria: Presenting urinalysis reflects 21-50 white blood cells in the setting of nitrate negative and no bacturia.  While the patient reports recent polyuria, it appears that this can be explained by auto diuresis in the setting of hyperglycemia,  as further described above, as opposed to representing an infectious urinary symptom.  He specifically denies any dysuria.  The basis of this rationale, will consider this presentation to be most consistent with asymptomatic pyuria, and refrain from initiation of antibiotic coverage at this time.  Plan: Work-up and management of HH NK, as above.  Repeat CBC in the morning.      #) Essential hypertension: Outpatient antihypertensive regimen consists of Norvasc and lisinopril, with which the patient acknowledges suboptimal compliance.  Initial blood pressure in the ED noted to be 140/84.  Plan: In the setting of concomitant acute kidney injury, will hold home lisinopril for now.  Close monitoring of ensuing blood pressures a routine vital signs.  We will plan to resume Norvasc in the morning.      #) Chronic tobacco abuse: The patient reports that he is a current smoker, having smoked approximately half pack per day over the last 30 years.  He reports that recently he has been able to reduce this slightly to a little under a pack per day.   Plan: Counseled the patient on importance of complete smoking discontinuation.  I placed an order for as needed nicotine patch for use during this hospitalization.    DVT prophylaxis: Lovenox 40 mg subcu daily Code Status: Per my discussions with the patient sitting, he wishes to be DNR/DNI Family Communication:  None Disposition Plan: Per Rounding Team Consults called: None Admission status: Observation; med telemetry.   PLEASE NOTE THAT DRAGON DICTATION SOFTWARE WAS USED IN THE CONSTRUCTION OF THIS NOTE.   [place] [facility] Pager [phone] From [place].   Otherwise, please contact night-coverage  [email or web address] Password [facility]  09/09/2019, 6:41 PM

## 2019-09-10 ENCOUNTER — Observation Stay (HOSPITAL_COMMUNITY): Payer: Self-pay

## 2019-09-10 DIAGNOSIS — R531 Weakness: Secondary | ICD-10-CM

## 2019-09-10 DIAGNOSIS — R7401 Elevation of levels of liver transaminase levels: Secondary | ICD-10-CM | POA: Diagnosis present

## 2019-09-10 DIAGNOSIS — R748 Abnormal levels of other serum enzymes: Secondary | ICD-10-CM

## 2019-09-10 DIAGNOSIS — E860 Dehydration: Secondary | ICD-10-CM | POA: Diagnosis present

## 2019-09-10 LAB — COMPREHENSIVE METABOLIC PANEL
ALT: 113 U/L — ABNORMAL HIGH (ref 0–44)
AST: 112 U/L — ABNORMAL HIGH (ref 15–41)
Albumin: 2.7 g/dL — ABNORMAL LOW (ref 3.5–5.0)
Alkaline Phosphatase: 421 U/L — ABNORMAL HIGH (ref 38–126)
Anion gap: 7 (ref 5–15)
BUN: 18 mg/dL (ref 6–20)
CO2: 26 mmol/L (ref 22–32)
Calcium: 8.5 mg/dL — ABNORMAL LOW (ref 8.9–10.3)
Chloride: 101 mmol/L (ref 98–111)
Creatinine, Ser: 0.59 mg/dL — ABNORMAL LOW (ref 0.61–1.24)
GFR calc Af Amer: >60 mL/min (ref >60)
GFR calc non Af Amer: >60 mL/min (ref >60)
Glucose, Bld: 224 mg/dL — ABNORMAL HIGH (ref 70–99)
Potassium: 4.2 mmol/L (ref 3.5–5.1)
Sodium: 134 mmol/L — ABNORMAL LOW (ref 135–145)
Total Bilirubin: 0.5 mg/dL (ref 0.3–1.2)
Total Protein: 5.2 g/dL — ABNORMAL LOW (ref 6.5–8.1)

## 2019-09-10 LAB — GLUCOSE, CAPILLARY
Glucose-Capillary: 81 mg/dL (ref 70–99)
Glucose-Capillary: 210 mg/dL — ABNORMAL HIGH (ref 70–99)
Glucose-Capillary: 200 mg/dL — ABNORMAL HIGH (ref 70–99)
Glucose-Capillary: 276 mg/dL — ABNORMAL HIGH (ref 70–99)
Glucose-Capillary: 191 mg/dL — ABNORMAL HIGH (ref 70–99)
Glucose-Capillary: 199 mg/dL — ABNORMAL HIGH (ref 70–99)

## 2019-09-10 LAB — CBC
HCT: 30.7 % — ABNORMAL LOW (ref 39.0–52.0)
Hemoglobin: 9.7 g/dL — ABNORMAL LOW (ref 13.0–17.0)
MCH: 29.1 pg (ref 26.0–34.0)
MCHC: 31.6 g/dL (ref 30.0–36.0)
MCV: 92.2 fL (ref 80.0–100.0)
Platelets: 173 10*3/uL (ref 150–400)
RBC: 3.33 MIL/uL — ABNORMAL LOW (ref 4.22–5.81)
RDW: 13.2 % (ref 11.5–15.5)
WBC: 6.6 10*3/uL (ref 4.0–10.5)
nRBC: 0 % (ref 0.0–0.2)

## 2019-09-10 LAB — BASIC METABOLIC PANEL
Anion gap: 7 (ref 5–15)
BUN: 20 mg/dL (ref 6–20)
CO2: 26 mmol/L (ref 22–32)
Calcium: 8.7 mg/dL — ABNORMAL LOW (ref 8.9–10.3)
Chloride: 106 mmol/L (ref 98–111)
Creatinine, Ser: 0.68 mg/dL (ref 0.61–1.24)
GFR calc Af Amer: >60 mL/min (ref >60)
GFR calc non Af Amer: >60 mL/min (ref >60)
Glucose, Bld: 83 mg/dL (ref 70–99)
Potassium: 4.1 mmol/L (ref 3.5–5.1)
Sodium: 139 mmol/L (ref 135–145)

## 2019-09-10 LAB — CBG MONITORING, ED
Glucose-Capillary: 102 mg/dL — ABNORMAL HIGH (ref 70–99)
Glucose-Capillary: 149 mg/dL — ABNORMAL HIGH (ref 70–99)
Glucose-Capillary: 68 mg/dL — ABNORMAL LOW (ref 70–99)

## 2019-09-10 LAB — ETHANOL: Alcohol, Ethyl (B): <10 mg/dL (ref <10)

## 2019-09-10 LAB — HEPATITIS PANEL, ACUTE
HCV Ab: NONREACTIVE
Hep A IgM: NONREACTIVE
Hep B C IgM: NONREACTIVE
Hepatitis B Surface Ag: NONREACTIVE

## 2019-09-10 LAB — HEMOGLOBIN A1C: Hgb A1c MFr Bld: >18.5 % — ABNORMAL HIGH (ref 4.8–5.6)

## 2019-09-10 LAB — PROTIME-INR
INR: 0.9 (ref 0.8–1.2)
Prothrombin Time: 12.2 seconds (ref 11.4–15.2)

## 2019-09-10 LAB — LIPASE, BLOOD: Lipase: 15 U/L (ref 11–51)

## 2019-09-10 LAB — MAGNESIUM: Magnesium: 1.9 mg/dL (ref 1.7–2.4)

## 2019-09-10 LAB — TSH: TSH: 2.407 u[IU]/mL (ref 0.350–4.500)

## 2019-09-10 LAB — GAMMA GT: GGT: 218 U/L — ABNORMAL HIGH (ref 7–50)

## 2019-09-10 LAB — SARS CORONAVIRUS 2 (TAT 6-24 HRS): SARS Coronavirus 2: NEGATIVE

## 2019-09-10 MED ORDER — INSULIN DETEMIR 100 UNIT/ML ~~LOC~~ SOLN
8.0000 [IU] | Freq: Two times a day (BID) | SUBCUTANEOUS | Status: DC
  Administered 2019-09-10 – 2019-09-11 (×3): 8 [IU] via SUBCUTANEOUS
  Filled 2019-09-10 (×4): qty 0.08

## 2019-09-10 MED ORDER — INSULIN ASPART 100 UNIT/ML ~~LOC~~ SOLN: 0.0000 [IU] | Freq: Every day | SUBCUTANEOUS | Status: DC

## 2019-09-10 MED ORDER — INSULIN ASPART 100 UNIT/ML ~~LOC~~ SOLN
0.0000 [IU] | Freq: Three times a day (TID) | SUBCUTANEOUS | Status: DC
  Administered 2019-09-10 (×2): 2 [IU] via SUBCUTANEOUS
  Administered 2019-09-10: 12:00:00 5 [IU] via SUBCUTANEOUS
  Administered 2019-09-11: 13:00:00 2 [IU] via SUBCUTANEOUS

## 2019-09-10 MED ORDER — LISINOPRIL 20 MG PO TABS
20.0000 mg | ORAL_TABLET | Freq: Every day | ORAL | Status: DC
  Administered 2019-09-10 – 2019-09-11 (×2): 20 mg via ORAL
  Filled 2019-09-10 (×2): qty 1

## 2019-09-10 NOTE — TOC Initial Note (Signed)
Transition of Care [facility]) - Initial/Assessment Note    Patient Details  Name: Rodney Barber MRN: [id number] Date of Birth: [birth year]  Transition of [facility]) CM/SW Contact:    Ida Rogue, LCSW Phone Number: 09/10/2019, 11:39 AM  Clinical Narrative:    Mr Crean is here for Hyperglycemia.  He has been staying at the [facility] for the past couple months, and has a caseworker there who has been working with him on disability application.  At d/c, he will return to the shelter, and follow up at [facility] health and [facility].  He is requesting a couple bus passes, and PT is recommending a cane for him. TOC will continue to follow during the course of hospitalization.                Expected Discharge Plan: Home/Self Care Barriers to Discharge: No Barriers Identified   Patient Goals and CMS Choice Patient states their goals for this hospitalization and ongoing recovery are:: "I need all the help I can get."      Expected Discharge Plan and Services Expected Discharge Plan: Home/Self Care In-house Referral: Clinical Social Work   Post Acute Care Choice: Durable Medical Equipment Living arrangements for the past 2 months: Homeless                                      Prior Living Arrangements/Services Living arrangements for the past 2 months: Homeless Lives with:: Other (Comment)(shelter residents) Patient language and need for interpreter reviewed:: Yes Do you feel safe going back to the place where you live?: Yes      Need for Family Participation in Patient Care: No (Comment) Care giver support system in place?: Yes (comment)   Criminal Activity/Legal Involvement Pertinent to Current Situation/Hospitalization: No - Comment as needed  Activities of Daily Living Home Assistive Devices/Equipment: CBG Meter ADL Screening (condition at time of admission) Patient's cognitive ability adequate to safely complete daily activities?: No Is the patient  deaf or have difficulty hearing?: No Does the patient have difficulty seeing, even when wearing glasses/contacts?: No Does the patient have difficulty concentrating, remembering, or making decisions?: No Patient able to express need for assistance with ADLs?: Yes Does the patient have difficulty dressing or bathing?: No Independently performs ADLs?: Yes (appropriate for developmental age) Does the patient have difficulty walking or climbing stairs?: No Weakness of Legs: Both Weakness of Arms/Hands: None  Permission Sought/Granted                  Emotional Assessment Appearance:: Appears stated age Attitude/Demeanor/Rapport: Engaged Affect (typically observed): Appropriate Orientation: : Oriented to Self, Oriented to Place, Oriented to Situation Alcohol / Substance Use: Not Applicable Psych Involvement: No (comment)  Admission diagnosis:  Hyperglycemia without ketosis [R73.9] Hyperosmolar hyperglycemic state (HHS) (HCC) [E11.00, E11.65] Patient Active Problem List   Diagnosis Date Noted  . Generalized weakness 09/10/2019  . Dehydration 09/10/2019  . Transaminitis 09/10/2019  . Hyperglycemia without ketosis 09/09/2019  . Hyperosmolar hyperglycemic state (HHS) (HCC) 09/09/2019  . UTI (urinary tract infection) 05/14/2019  . Hyperglycemia 05/14/2019  . Tobacco use 05/14/2019  . Abnormal blood electrolyte level 05/14/2019  . Suicide attempt (HCC) 02/26/2019  . Acetaminophen toxicity   . Goals of care, counseling/discussion   . Palliative care by specialist   . Shock liver 02/18/2019  . Hyperosmolar non-ketotic state in patient with type 2 diabetes mellitus (HCC) 02/18/2019  .  Alcohol use 02/18/2019  . Substance use disorder 02/18/2019  . AKI (acute kidney injury) ([place])   . Altered behavior   . Altered mental status 12/02/2018  . Abnormal liver function 12/02/2018  . AMS (altered mental status) 12/02/2018  . Cough 12/02/2018  . Uncontrolled type 2 diabetes mellitus  ([place]) 03/15/2018  . Urine test positive for microalbuminuria 07/12/2017  . Type 2 diabetes mellitus with other specified complication ([place]) 62/13/0865  . Liver lesion 07/22/2013  . 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 Diabetes ([place]) 07/18/2013  . Hypokalemia 07/11/2013  . Protein-calorie malnutrition, severe ([place]) 07/11/2013  . Chronic pancreatitis ([place]) 07/10/2013  . 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 Diabetes mellitus ([place]) 07/10/2013  . Weight loss 07/10/2013  . Diarrhea 07/10/2013  . Liver lesion, right lobe 07/10/2013   PCP:  Patient, No Pcp Per Pharmacy:   [facility], [address] [place] [address] Phone: [phone] Fax: [phone]  [facility] [place], [place] [address] [place] [address] Phone: [phone] Fax: [phone]     Social Determinants of Health (SDOH) Interventions    Readmission Risk Interventions Readmission Risk Prevention Plan 06/24/2019 02/26/2019 02/22/2019  Transportation Screening Complete Complete Complete  Medication Review ([place]) Complete Complete Complete  PCP or Specialist appointment within 3-5 days of discharge Complete Complete Complete  HRI or Home Care Consult Complete Complete Complete  SW Recovery Care/Counseling Consult Complete Complete Complete  Palliative Care Screening Not Applicable Complete Complete  [place] Not Applicable - Not Applicable  Some recent data might be hidden

## 2019-09-10 NOTE — Progress Notes (Addendum)
PROGRESS NOTE    Rodney Barber  DZH:[id number] DOB: 11/06/1965 DOA: 09/09/2019 PCP: Patient, No Pcp Per   Brief Narrative: Rodney Barber is a 54 y.o. male with a history of diabetes mellitus, type 2, hypertension, tobacco use. Patient presented secondary to weakness and found to have significant hyperglycemia without evidence of DKA. Started on insulin drip which is now transitioned off. Elevated AST/ALT/Alkaline phosphatase.   Assessment & Plan:   Principal Problem:   Hyperosmolar hyperglycemic state (HHS) ([place]) Active Problems:   AKI (acute kidney injury) ([place])   Hyperglycemia without ketosis   Generalized weakness   Dehydration   Transaminitis   Hyperosmolar hyperglycemic state Secondary to medication non-adherence. Patient states he has not been taking his diabetes medication secondary to diarrhea. Patient with a blood sugar of 1017 on admission and was started on an insulin drip. No anion gap elevation (AG of 10) or urine ketones present. Insulin drip transitioned off. Hemoglobin A1C of >18.5% -SSI qAC and QHS -Will need to discharge on insulin therapy -Discontinue IV fluids once taking adequate PO -Levemir 8 units BID; will need to transition to insulin 70/30 on discharge secondary to lack of insurance -Diabetes coordinator  Elevated AST/ALT/Alkaline phosphatase This appears to be recurrent. Unsure of etiology. No abdominal pain. Patient has a previous RUQ from 2010 which was significant for sludge. AST/ALT/Alkaline phosphatase decreased today. GGT elevated.  -RUQ ultrasound  Generalized weakness Possibly related to hyperglycemia. Also likely related to overall malnutrition of unknown severity.  -PT eval pending  Underweight Body mass index is 14.85 kg/m. Weight has been low chronically but acutely worsened in the last few months -Dietitian consult -Will obtain standing weight to confirm  AKI Baseline creatinine of 0.8. Creatinine of 1.14 on admission and now  resolved with IV fluids.  Essential hypertension Patient is on amlodipine and lisinopril as an outpatient. Lisinopril held on admission secondary to AKI -Continue amlodipine and resume home lisinopril  Tobacco use Counseled on admission. -Continue nicotine patch   DVT prophylaxis: Lovenox Code Status:   Code Status: DNR Family Communication: None at bedside Disposition Plan: Discharge back to group home likely in 24 hours pending results of ultrasound, stabilization of blood sugar   Consultants:   None  Procedures:   None  Antimicrobials:  None    Subjective: Feels bad. No nausea or vomiting. No abdominal pain.  Objective: Vitals:   09/09/19 2045 09/09/19 2233 09/10/19 0229 09/10/19 0612  BP:  119/80 129/81 (!) 153/83  Pulse: 81 73 66 69  Resp: 12 13 16 16   Temp:   97.9 F (36.6 C) 97.7 F (36.5 C)  TempSrc:   Oral Oral  SpO2: 99% 100% 100% 100%  Weight:    44.3 kg  Height:        Intake/Output Summary (Last 24 hours) at 09/10/2019 1031 Last data filed at 09/10/2019 0827 Gross per 24 hour  Intake 1687 ml  Output 1101 ml  Net 586 ml   Filed Weights   09/09/19 1540 09/10/19 0612  Weight: 54.4 kg 44.3 kg    Examination:  General exam: Appears calm and comfortable. Thin. HEENT: poor dentition Respiratory system: Clear to auscultation. Respiratory effort normal. Cardiovascular system: S1 & S2 heard, RRR. No murmurs, rubs, gallops or clicks. Gastrointestinal system: Abdomen is nondistended, soft and nontender. No organomegaly or masses felt. Normal bowel sounds heard. Central nervous system: Alert and oriented. No focal neurological deficits. Extremities: No edema. No calf tenderness Skin: No cyanosis. No rashes Psychiatry: Judgement and insight appear  normal. Mood & affect appropriate.     Data Reviewed: I have personally reviewed following labs and imaging studies  CBC: Recent Labs  Lab 09/09/19 1648 09/10/19 0529  WBC 6.2 6.6  HGB 11.9* 9.7*    HCT 37.5* 30.7*  MCV 94.7 92.2  PLT 194 173   Basic Metabolic Panel: Recent Labs  Lab 09/09/19 1648 09/09/19 1943 09/10/19 0001 09/10/19 0529  NA 125* 137 139 134*  K 4.8 4.0 4.1 4.2  CL 90* 102 106 101  CO2 25 27 26 26   GLUCOSE 1,017* 470* 83 224*  BUN 29* 25* 20 18  CREATININE 1.14 0.92 0.68 0.59*  CALCIUM 8.8* 8.9 8.7* 8.5*  MG  --  2.1  --  1.9   GFR: Estimated Creatinine Clearance: 65.4 mL/min (A) (by C-G formula based on SCr of 0.59 mg/dL (L)). Liver Function Tests: Recent Labs  Lab 09/09/19 1648 09/10/19 0529  AST 172* 112*  ALT 164* 113*  ALKPHOS 555* 421*  BILITOT 0.3 0.5  PROT 6.6 5.2*  ALBUMIN 3.6 2.7*   Recent Labs  Lab 09/10/19 0529  LIPASE 15   No results for input(s): AMMONIA in the last 168 hours. Coagulation Profile: Recent Labs  Lab 09/10/19 0529  INR 0.9   Cardiac Enzymes: No results for input(s): CKTOTAL, CKMB, CKMBINDEX, TROPONINI in the last 168 hours. BNP (last 3 results) No results for input(s): PROBNP in the last 8760 hours. HbA1C: No results for input(s): HGBA1C in the last 72 hours. CBG: Recent Labs  Lab 09/10/19 0120 09/10/19 0144 09/10/19 0248 09/10/19 0514 09/10/19 0740  GLUCAP 68* 149* 81 210* 200*   Lipid Profile: No results for input(s): CHOL, HDL, LDLCALC, TRIG, CHOLHDL, LDLDIRECT in the last 72 hours. Thyroid Function Tests: Recent Labs    09/10/19 0529  TSH 2.407   Anemia Panel: No results for input(s): VITAMINB12, FOLATE, FERRITIN, TIBC, IRON, RETICCTPCT in the last 72 hours. Sepsis Labs: No results for input(s): PROCALCITON, LATICACIDVEN in the last 168 hours.  Recent Results (from the past 240 hour(s))  SARS CORONAVIRUS 2 (TAT 6-24 HRS) Nasopharyngeal Nasopharyngeal Swab     Status: None   Collection Time: 09/09/19  7:43 PM   Specimen: Nasopharyngeal Swab  Result Value Ref Range Status   SARS Coronavirus 2 NEGATIVE NEGATIVE Final    Comment: (NOTE) SARS-CoV-2 target nucleic acids are NOT  DETECTED. The SARS-CoV-2 RNA is generally detectable in upper and lower respiratory specimens during the acute phase of infection. Negative results do not preclude SARS-CoV-2 infection, do not rule out co-infections with other pathogens, and should not be used as the sole basis for treatment or other patient management decisions. Negative results must be combined with clinical observations, patient history, and epidemiological information. The expected result is Negative. Fact Sheet for Patients: 11/07/19 Fact Sheet for Healthcare Providers: HairSlick.no This test is not yet approved or cleared by the quierodirigir.com FDA and  has been authorized for detection and/or diagnosis of SARS-CoV-2 by FDA under an Emergency Use Authorization (EUA). This EUA will remain  in effect (meaning this test can be used) for the duration of the COVID-19 declaration under Section 56 4(b)(1) of the Act, 21 U.S.C. section 360bbb-3(b)(1), unless the authorization is terminated or revoked sooner. Performed at [facility], [address] [place], [place]          Radiology Studies: No results found.      Scheduled Meds: . amLODipine  5 mg Oral Daily  . enoxaparin (  LOVENOX) injection  40 mg Subcutaneous Q24H  . escitalopram  5 mg Oral Daily  . insulin aspart  0-5 Units Subcutaneous QHS  . insulin aspart  0-9 Units Subcutaneous TID WC  . sodium chloride flush  3 mL Intravenous Q12H   Continuous Infusions:   LOS: 0 days     Jacquelin Hawking, MD [facility] 09/10/2019, 10:31 AM  If 7PM-7AM, please contact night-coverage [email or web address]

## 2019-09-10 NOTE — Progress Notes (Signed)
Inpatient Diabetes Program Recommendations  AACE/ADA: New Consensus Statement on Inpatient Glycemic Control (2015)  Target Ranges:  Prepandial:   less than 140 mg/dL      Peak postprandial:   less than 180 mg/dL (1-2 hours)      Critically ill patients:  140 - 180 mg/dL   Lab Results  Component Value Date   GLUCAP 276 (H) 09/10/2019   HGBA1C >18.5 (H) 09/10/2019    Review of Glycemic Control  Diabetes history: DM2 Outpatient Diabetes medications: glipizide 10 mg with breakfast, metformin 500 mg QAM (has previously been on Levemir and Novolog) Current orders for Inpatient glycemic control: Levemir 8 units bid, Novolog 0-9 units tidwc and 0-5 units QHS  HgbA1C - > 18.5% Pt is homeless. PCP is [facility]. Previously on insulin. Hx frequent hypos. Metformin causing diarrhea and pt quit taking it about a month ago Eating very little at homeless shelter "food is bad."   Inpatient Diabetes Program Recommendations:     Decrease Levemir to 8 units QD (due to frequent hypos)  Pt should be able to get Levemir/Novolog at [facility]. Since he doesn't eat regularly at homeless shelter, would not recommend 70/30 at discharge.  Thank you. Ailene Ards, RD, LDN, CDE Inpatient Diabetes Coordinator [phone]

## 2019-09-10 NOTE — Evaluation (Signed)
Physical Therapy Evaluation Patient Details Name: Rodney Barber MRN: [id number] DOB: 1966/06/25 Today's Date: 09/10/2019   History of Present Illness  54 y.o. male with medical history significant for type 2 diabetes mellitus, essential hypertension, chronic tobacco abuse, who is admitted to [facility] on 09/09/2019 with hyperglycemic hyperosmolar nonketotic state, AKI.  Clinical Impression  Pt admitted with above diagnosis. Pt ambulated 30' without an assistive device with mildly unsteady gait, wide base of support and decreased step length. He ambulated 220' with RW, with much improved gait pattern. Pt ambulates with a cane at baseline, but stated he lost his cane. Pt would benefit from a new cane. Pt resides at a shelter.  Pt currently with functional limitations due to the deficits listed below (see PT Problem List). Pt will benefit from skilled PT to increase their independence and safety with mobility to allow discharge to the venue listed below.       Follow Up Recommendations No PT follow up    Equipment Recommendations  Cane    Recommendations for Other Services       Precautions / Restrictions Precautions Precautions: Fall Precaution Comments: pt reports 2 falls in the past 1 year Restrictions Weight Bearing Restrictions: No      Mobility  Bed Mobility Overal bed mobility: Independent                Transfers Overall transfer level: Independent                  Ambulation/Gait Ambulation/Gait assistance: Modified independent (Device/Increase time) Gait Distance (Feet): 250 Feet Assistive device: Rolling walker (2 wheeled) Gait Pattern/deviations: WFL(Within Functional Limits) Gait velocity: WFL   General Gait Details: pt ambulated 30' without AD with wide base of support and decreased step length, he stated he felt more steady with RW, but prefers a cane. Pt is steady and has more fluid gait pattern with RW.  He uses a cane at baseline but  lost his cane, he can't recall how long ago he lost it.  Stairs            Wheelchair Mobility    Modified Rankin (Stroke Patients Only)       Balance Overall balance assessment: Needs assistance   Sitting balance-Leahy Scale: Normal       Standing balance-Leahy Scale: Good Standing balance comment: mildly unsteady when ambulating without AD                             Pertinent Vitals/Pain Pain Assessment: No/denies pain    Home Living Family/patient expects to be discharged to:: Shelter/Homeless                      Prior Function Level of Independence: Independent with assistive device(s)         Comments: pt had a straight cane but lost it, he can't recall how long ago he lost it     Hand Dominance        Extremity/Trunk Assessment   Upper Extremity Assessment Upper Extremity Assessment: Overall WFL for tasks assessed    Lower Extremity Assessment Lower Extremity Assessment: Overall WFL for tasks assessed       Communication   Communication: No difficulties  Cognition Arousal/Alertness: Awake/alert Behavior During Therapy: WFL for tasks assessed/performed Overall Cognitive Status: No family/caregiver present to determine baseline cognitive functioning  General Comments: alert and oriented, can follow commands, vague historian -pt couldn't recall how long ago he lost his cane      General Comments      Exercises     Assessment/Plan    PT Assessment Patient needs continued PT services  PT Problem List Decreased balance       PT Treatment Interventions Balance training    PT Goals (Current goals can be found in the Care Plan section)  Acute Rehab PT Goals Patient Stated Goal: return to walking with a cane PT Goal Formulation: With patient Time For Goal Achievement: 09/24/19 Potential to Achieve Goals: Good    Frequency Min 3X/week   Barriers to discharge         Co-evaluation               AM-PAC PT "6 Clicks" Mobility  Outcome Measure Help needed turning from your back to your side while in a flat bed without using bedrails?: None Help needed moving from lying on your back to sitting on the side of a flat bed without using bedrails?: None Help needed moving to and from a bed to a chair (including a wheelchair)?: None Help needed standing up from a chair using your arms (e.g., wheelchair or bedside chair)?: None Help needed to walk in hospital room?: None Help needed climbing 3-5 steps with a railing? : None 6 Click Score: 24    End of Session Equipment Utilized During Treatment: Gait belt Activity Tolerance: Patient tolerated treatment well Patient left: in chair;with call bell/phone within reach Nurse Communication: Mobility status PT Visit Diagnosis: Difficulty in walking, not elsewhere classified (R26.2);Unsteadiness on feet (R26.81)    Time: 0076-2263 PT Time Calculation (min) (ACUTE ONLY): 15 min   Charges:   PT Evaluation $PT Eval Low Complexity: 1 Low         Blondell Reveal Kistler PT 09/10/2019  Acute Rehabilitation Services Pager [phone] Office [phone]

## 2019-09-11 LAB — GLUCOSE, CAPILLARY
Glucose-Capillary: 101 mg/dL — ABNORMAL HIGH (ref 70–99)
Glucose-Capillary: 165 mg/dL — ABNORMAL HIGH (ref 70–99)

## 2019-09-11 MED ORDER — "INSULIN SYRINGE 31G X 5/16"" 0.3 ML MISC": 0 refills | Status: DC

## 2019-09-11 MED ORDER — TRUE METRIX BLOOD GLUCOSE TEST VI STRP: ORAL_STRIP | 0 refills | Status: DC

## 2019-09-11 MED ORDER — ADULT MULTIVITAMIN W/MINERALS CH
1.0000 | ORAL_TABLET | Freq: Every day | ORAL | Status: DC
  Administered 2019-09-11: 13:00:00 1 via ORAL
  Filled 2019-09-11: qty 1

## 2019-09-11 MED ORDER — INSULIN DETEMIR 100 UNIT/ML ~~LOC~~ SOLN: 8.0000 [IU] | Freq: Two times a day (BID) | SUBCUTANEOUS | 0 refills | Status: DC

## 2019-09-11 MED ORDER — GLUCERNA SHAKE PO LIQD
237.0000 mL | Freq: Three times a day (TID) | ORAL | Status: DC
  Administered 2019-09-11: 237 mL via ORAL
  Filled 2019-09-11 (×2): qty 237

## 2019-09-11 MED ORDER — TRUERESULT BLOOD GLUCOSE W/DEVICE KIT: PACK | 0 refills | Status: DC

## 2019-09-11 MED ORDER — TRUEPLUS LANCETS 28G MISC: 12 refills | Status: DC

## 2019-09-11 MED ORDER — GLUCERNA SHAKE PO LIQD: 237.0000 mL | Freq: Three times a day (TID) | ORAL | 0 refills | Status: AC

## 2019-09-11 MED FILL — TRUEPLUS SYR 0.3ML 30GX5/16: 30G X 5/16" | 50 days supply | Qty: 100 | Fill #0

## 2019-09-11 MED FILL — TRUEplus LANCETS 28G MISC: 25 days supply | Qty: 100 | Fill #0

## 2019-09-11 MED FILL — TRUE METRIX GLUCOSE TEST ST: 25 days supply | Qty: 100 | Fill #0

## 2019-09-11 MED FILL — !LEVEMIR 100 UNITS/ML VIAL: 100/ML | 42 days supply | Qty: 10 | Fill #0

## 2019-09-11 NOTE — Discharge Instructions (Signed)
Rodney Barber,  You were in the hospital because of high blood sugar and dehydration. This improved with insulin through the IV and IV fluids. You have been restarted on insulin under the skin. Please follow-up with your primary care physician.

## 2019-09-11 NOTE — Progress Notes (Signed)
Physical Therapy Treatment Patient Details Name: Rodney Barber MRN: [id number] DOB: Nov 29, 1965 Today's Date: 09/11/2019    History of Present Illness 54 y.o. male with medical history significant for type 2 diabetes mellitus, essential hypertension, chronic tobacco abuse, who is admitted to [facility] on 09/09/2019 with hyperglycemic hyperosmolar nonketotic state, AKI.    PT Comments    Assisted pt OOB to bathroom.  Assisted with a wash up at Supervision level esp with standing at sink.  Slight unsteadiness holding to sink during activity.  Self able to wash all extremities.  Advised to sit for LB for increased safety.  Assisted with amb a great distance in hallway.  General Gait Details: slight unsteadiness esp with head turns and distractions.  Pt would benefit from a SPC.  Follow Up Recommendations  No PT follow up     Equipment Recommendations  Cane    Recommendations for Other Services       Precautions / Restrictions Precautions Precautions: Fall Precaution Comments: pt reports 2 falls in the past 1 year Restrictions Weight Bearing Restrictions: No    Mobility  Bed Mobility Overal bed mobility: Modified Independent                Transfers Overall transfer level: Modified independent Equipment used: None             General transfer comment: approx 50% assist/steady self wall/rail  Ambulation/Gait Ambulation/Gait assistance: Supervision Gait Distance (Feet): 220 Feet Assistive device: 1 person hand held assist Gait Pattern/deviations: Step-through pattern Gait velocity: WFL   General Gait Details: slight unsteadiness esp with head turns and distractions.  Pt would benefit from a SPC.   Stairs             Wheelchair Mobility    Modified Rankin (Stroke Patients Only)       Balance                                            Cognition Arousal/Alertness: Awake/alert Behavior During Therapy: WFL for tasks  assessed/performed Overall Cognitive Status: Within Functional Limits for tasks assessed                                 General Comments: AxO x 4 pleasant      Exercises      General Comments        Pertinent Vitals/Pain Pain Assessment: No/denies pain    Home Living                      Prior Function            PT Goals (current goals can now be found in the care plan section) Progress towards PT goals: Progressing toward goals    Frequency    Min 3X/week      PT Plan Current plan remains appropriate    Co-evaluation              AM-PAC PT "6 Clicks" Mobility   Outcome Measure  Help needed turning from your back to your side while in a flat bed without using bedrails?: None Help needed moving from lying on your back to sitting on the side of a flat bed without using bedrails?: None Help needed moving to and from a bed to a chair (  including a wheelchair)?: None Help needed standing up from a chair using your arms (e.g., wheelchair or bedside chair)?: None Help needed to walk in hospital room?: None Help needed climbing 3-5 steps with a railing? : None 6 Click Score: 24    End of Session Equipment Utilized During Treatment: Gait belt Activity Tolerance: Patient tolerated treatment well Patient left: in chair;with call bell/phone within reach;with chair alarm set Nurse Communication: Mobility status PT Visit Diagnosis: Difficulty in walking, not elsewhere classified (R26.2);Unsteadiness on feet (R26.81)     Time: 4098-1191 PT Time Calculation (min) (ACUTE ONLY): 25 min  Charges:  $Gait Training: 8-22 mins $Therapeutic Activity: 8-22 mins                     {Ysabelle Goodroe  PTA Acute  Rehabilitation Services Pager      [phone] Office      [phone]

## 2019-09-11 NOTE — TOC Transition Note (Signed)
Transition of Care [facility]) - CM/SW Discharge Note   Patient Details  Name: Rodney Barber MRN: [id number] Date of Birth: Jun 23, 1966  Transition of Care [facility]) CM/SW Contact:  Ida Rogue, LCSW Phone Number: 09/11/2019, 9:53 AM   Clinical Narrative:   Patient to d/c today.  Given bus passes to get to [facility] to pick up meds from pharmacy, and then back to shelter.  Hospital follow up appointment in place. Dr put in order for cane per PT recommendation and I called [employer] for delivery.     Final next level of care: Home/Self Care Barriers to Discharge: No Barriers Identified   Patient Goals and CMS Choice Patient states their goals for this hospitalization and ongoing recovery are:: "I need all the help I can get."      Discharge Placement                       Discharge Plan and Services In-house Referral: Clinical Social Work   Post Acute Care Choice: Durable Medical Equipment                               Social Determinants of Health (SDOH) Interventions     Readmission Risk Interventions Readmission Risk Prevention Plan 06/24/2019 02/26/2019 02/22/2019  Transportation Screening Complete Complete Complete  Medication Review [facility]) Complete Complete Complete  PCP or Specialist appointment within 3-5 days of discharge Complete Complete Complete  HRI or Home Care Consult Complete Complete Complete  SW Recovery Care/Counseling Consult Complete Complete Complete  Palliative Care Screening Not Applicable Complete Complete  Skilled Nursing Facility Not Applicable - Not Applicable  Some recent data might be hidden

## 2019-09-11 NOTE — Progress Notes (Signed)
Patient smoking cigarette in bathroom(1513) cigarettes/lighter removed security/ac notified Reviewed policy with patient.

## 2019-09-11 NOTE — Progress Notes (Signed)
Initial Nutrition Assessment  DOCUMENTATION CODES:   Severe malnutrition in context of social or environmental circumstances, Underweight  INTERVENTION:  -Glucerna Shake po TID, each supplement provides 220 kcal and 10 grams of protein (pt likes strawberry)  -MVI with minerals daily  -Education  NUTRITION DIAGNOSIS:   Severe Malnutrition related to social / environmental circumstances(homelessness) as evidenced by severe muscle depletion, severe fat depletion, energy intake < 75% for > or equal to 3 months.    GOAL:   Patient will meet greater than or equal to 90% of their needs, Weight gain    MONITOR:   PO intake, Supplement acceptance, I & O's, Weight trends, Labs  REASON FOR ASSESSMENT:   Consult Assessment of nutrition requirement/status, Diet education  ASSESSMENT:  54 year old male with past medical history of DM2, essential HTN, and chronic tobacco abuse presented with reports of 3-4 days of generalized weakness and admitted with hyperglycemic hyperosmolar nonketotic state.  Per notes, pt has been staying at the [facility] over the past couple of months. Pt is discharging today with plans to return to shelter. Patient to follow up at [facility].   Patient reclined in bedside chair, watching television at RD visit. He reports that breakfast was alright this morning, eating about half of pancakes and eggs and stated that he was going to order a cheeseburger with salad for lunch around 12.   Patient reports that the shelter serves 3 meals/day and recalls lots of sandwiches, bread, and sweets. He stated that every time he turns around, someone is trying to give him a cupcake. Patient reports that he often times does not eat due to the food not tasting good, reports stale bread and hard cheese. Patient confirmed snacks of peanut butter crackers, but stated he is tired of them. RD educated on peanut butter as a good source of fat and protein,  encouraged patient to try and consume more of the meals and snacks that are offered to him, adding mayonnaise to sandwiches for added calories and drinking full fat milk when it is available. RD encouraged pt to inquire about the possibility of having nutrition supplements covered under medications at [facility]. Patient amenable to suggestions, and requested trying Glucerna supplement in strawberry flavor.   Current wt 97.46 lbs UBW ~130 lbs per pt report Weights have fluctuated over the past 6 months, noted 26.4 lb (21.3%) wt loss in the last 3 months. On 06/22/19 pt wt 56.3 kg (123.86 lbs), on 02/22/19 pt wt 49.3 kg (108.46 lbs)  Patient meets criteria for severe malnutrition in the context of social/environmental circumstances given severe fat/muscle depletions noted on NFPE and dietary recall provided by pt. RD educated on the importance of adequate calories and protein, encouraged oral intake of provided meals and snacks, recommended incorporating ONS if possible via outpatient provider and will provide nutrition supplement during admission.   Medications reviewed and include: Lexapro, SS novolog, Levemir  Labs: CBGs 101,199,191,276 x 24 hrs, Na 134 (L)  NUTRITION - FOCUSED PHYSICAL EXAM:    Most Recent Value  Orbital Region  Severe depletion  Upper Arm Region  Severe depletion  Thoracic and Lumbar Region  Severe depletion  Buccal Region  Severe depletion  Temple Region  Severe depletion  Clavicle Bone Region  Severe depletion  Clavicle and Acromion Bone Region  Severe depletion  Scapular Bone Region  Severe depletion  Dorsal Hand  Severe depletion  Patellar Region  Severe depletion  Anterior  Thigh Region  Severe depletion  Posterior Calf Region  Severe depletion  Edema (RD Assessment)  None [pt reports occassional BLE]  Hair  Reviewed  Eyes  Reviewed  Mouth  Reviewed  Skin  Reviewed [dry]  Nails  Reviewed       Diet Order:   Diet Order            Diet  Carb Modified Fluid consistency: Thin; Room service appropriate? Yes  Diet effective now              EDUCATION NEEDS:   Education needs have been addressed  Skin:  Skin Assessment: Reviewed RN Assessment  Last BM:  2/4 type 6  Height:   Ht Readings from Last 1 Encounters:  09/09/19 5\' 8"  (1.727 m)    Weight:   Wt Readings from Last 1 Encounters:  09/11/19 44.3 kg    Ideal Body Weight:  70 kg  BMI:  Body mass index is 14.85 kg/m.  Estimated Nutritional Needs:   Kcal:  1400-1600  Protein:  70-80  Fluid:  >/= 1.4 L/day   Lajuan Lines, RD, LDN Clinical Nutrition Office Telephone [phone] After Hours/Weekend Pager: [phone]

## 2019-09-11 NOTE — Discharge Summary (Signed)
Physician Discharge Summary  Rodney Barber FFM:[id number] DOB: 23-Feb-1966 DOA: 09/09/2019  PCP: Patient, No Pcp Per  Admit date: 09/09/2019 Discharge date: 09/11/2019  Admitted From: Shelter Disposition: Shelter  Recommendations for Outpatient Follow-up:  1. Follow up with PCP on 09/22/2019 2. Please obtain CMP/CBC in one week 3. Please follow up on the following pending results: None  Home Health: None Equipment/Devices: Cane  Discharge Condition: Stable CODE STATUS: DNR Diet recommendation: Carb modified   Brief/Interim Summary:  Admission HPI written by Babs Bertin, DO   Chief Complaint: Generalized weakness  HPI: Rodney Barber is a 54 y.o. male with medical history significant for type 2 diabetes mellitus, essential hypertension, chronic tobacco abuse, who is admitted to [facility] on 09/09/2019 with hyperglycemic hyperosmolar nonketotic state after presenting from home to [facility] Department complaining of generalized weakness.   The patient reports 3 to 4 days of generalized weakness in the absence of any acute focal weakness or acute focal neurologic deficits.  Denies any associated acute numbness, paresthesias, change in vision, diplopia, slurred speech, facial droop, dysphagia, or vertigo.  He notes associated polyuria over the last week, but denies any associated dysuria or gross hematuria.  Denies any recent chest pain, shortness of breath, palpitations, diaphoresis, or nausea/vomiting.  Denies any associated subjective fever, chills, rigors, or generalized myalgias. Denies any recent headache, neck stiffness, rhinitis, rhinorrhea, sore throat, cough, abdominal pain, diarrhea, or rash. No recent traveling or known COVID-19 exposures.   Per chart review, at the time of a prior hospitalization, the patient's home insulin was discontinued given findings of hypoglycemia.  In its place, the patient was started on Metformin and  glipizide.  However, following initiation of these 2 oral hypoglycemic agents, the patient reports that he developed mild abdominal discomfort as well as diarrhea, prompting the patient to completely discontinue both his Metformin glipizide approximately 1 month ago, without interval resumption of insulin or addition of another oral hypoglycemic agent.  Additionally, he acknowledges that he has been checking his blood sugars very infrequently following aforementioned discontinuation of Metformin and glipizide 1 month ago.    Hospital course:  Hyperosmolar hyperglycemic state Secondary to medication non-adherence. Patient states he has not been taking his diabetes medication secondary to diarrhea. Patient with a blood sugar of 1017 on admission and was started on an insulin drip. No anion gap elevation (AG of 10) or urine ketones present. Insulin drip transitioned to Levemir 8 units BID. Blood sugar readings remained less than 180 g/dL. Hemoglobin A1C of >18.5%. Patient to be discharged on new prescription of Levemir 8 units BID. Discontinue metformin and glipizide. He will need close follow-up with PCP and this was discussed with him.  Elevated AST/ALT/Alkaline phosphatase This appears to be recurrent. Unsure of etiology. No abdominal pain. Patient has a previous RUQ from 2010 which was significant for sludge. AST/ALT/Alkaline phosphatase decreased today. GGT elevated. RUQ significant for chronically dilated CBD with no acute process identified. Will need outpatient monitoring.  Generalized weakness Possibly related to hyperglycemia. Also likely related to overall malnutrition of unknown severity. Physical therapy consulted and patient provided with a walking cane.  Underweight Severe malnutrition Body mass index is 14.85 kg/m. Weight has been low chronically but acutely worsened in the last few months. Dietitian consulted and is recommending low carb protein supplementation. Need for adequate  nutrition discussed with patient.  AKI Baseline creatinine of 0.8. Creatinine of 1.14 on admission and now resolved with IV fluids.  Essential hypertension Patient  is on amlodipine and lisinopril as an outpatient. Lisinopril held on admission secondary to AKI. Continue home regimen.  Tobacco use Counseled on admission. Continue nicotine patch  Discharge Diagnoses:  Principal Problem:   Hyperosmolar hyperglycemic state (HHS) ([place]) Active Problems:   Severe malnutrition ([place])   AKI (acute kidney injury) ([place])   Hyperglycemia without ketosis   Generalized weakness   Dehydration   Transaminitis    Discharge Instructions  Discharge Instructions    Call MD for:  persistant nausea and vomiting   Complete by: As directed    Call MD for:  temperature >100.4   Complete by: As directed    Increase activity slowly   Complete by: As directed      Allergies as of 09/11/2019   No Known Allergies     Medication List    STOP taking these medications   glipiZIDE 10 MG 24 hr tablet Commonly known as: GLUCOTROL XL   magnesium oxide 400 (241.3 Mg) MG tablet Commonly known as: MAG-OX   metFORMIN 500 MG 24 hr tablet Commonly known as: GLUCOPHAGE-XR     TAKE these medications   amLODipine 5 MG tablet Commonly known as: NORVASC Take 1 tablet (5 mg total) by mouth daily.   atorvastatin 20 MG tablet Commonly known as: LIPITOR Take 1 tablet (20 mg total) by mouth daily at 6 PM.   escitalopram 10 MG tablet Commonly known as: Lexapro Take 0.5 tablets (5 mg total) by mouth daily.   feeding supplement (GLUCERNA SHAKE) Liqd Take 237 mLs by mouth 3 (three) times daily between meals.   folic acid 1 MG tablet Commonly known as: FOLVITE Take 1 tablet (1 mg total) by mouth daily.   insulin detemir 100 UNIT/ML injection Commonly known as: LEVEMIR Inject 0.08 mLs (8 Units total) into the skin 2 (two) times daily.   INSULIN SYRINGE .3CC/31GX5/16" 31G X 5/16" 0.3 ML Misc Draw 8  units of Levemir with syringe twice per day   lactulose 10 GM/15ML solution Commonly known as: CHRONULAC Take 30 mLs (20 g total) by mouth 2 (two) times daily.   lisinopril 20 MG tablet Commonly known as: ZESTRIL Take 1 tablet (20 mg total) by mouth daily.   nicotine 14 mg/24hr patch Commonly known as: NICODERM CQ - dosed in mg/24 hours Place 1 patch (14 mg total) onto the skin daily.   True Metrix Blood Glucose Test test strip Generic drug: glucose blood Use as instructed   TRUEplus Lancets 28G Misc uad   TRUEresult Blood Glucose w/Device Kit uad            Durable Medical Equipment  (From admission, onward)         Start     Ordered   09/11/19 0917  For home use only DME Cane  Once     09/11/19 0916         Follow-up Information    [facility] Follow up on 09/22/2019.   Specialty: Internal Medicine Why: Monday at 2:50 for your hospital follow up appointment Contact information: [address] [id number] [place] [phone]         No Known Allergies  Consultations:  None   Procedures/Studies: US Abdomen Limited RUQ  Result Date: 09/10/2019 CLINICAL DATA:  54 year old male with abnormal LFTs. History of chronic calcific pancreatitis. EXAM: ULTRASOUND ABDOMEN LIMITED RIGHT UPPER QUADRANT COMPARISON:  CT Abdomen and Pelvis 07/10/2013. FINDINGS: Gallbladder: Gallbladder wall thickness remains normal. There is no pericholecystic  fluid. Adherent sludge, nonshadowing stone or small gallbladder polyp measuring 3 millimeters on image 20. No other cholelithiasis identified. No sonographic Murphy sign elicited. Common bile duct: Diameter: 8 millimeters, which is abnormal but an increase of about only 1 millimeter since 2014. Liver: Within normal limits in parenchymal echogenicity. No discrete liver lesion identified. Portal vein is patent on color Doppler imaging with normal direction of blood flow  towards the liver. Other: The right kidney is echogenic and difficult to identify. IMPRESSION: 1. Chronic enlargement of the CBD not significantly changed since 2014. 2. Tiny 3 mm gallbladder polyp versus adherent stone, but no other cholelithiasis and no evidence of acute cholecystitis. 3. Ultrasound appearance of the liver within normal limits. Electronically Signed   By: Genevie Ann M.D.   On: 09/10/2019 17:20      Subjective: No issues today. No nausea or vomiting  Discharge Exam: Vitals:   09/10/19 2030 09/11/19 0422  BP: 104/64 115/68  Pulse: 83 75  Resp: 16 17  Temp: 99.4 F (37.4 C) 99 F (37.2 C)  SpO2: 100% 99%   Vitals:   09/10/19 1341 09/10/19 2030 09/11/19 0422 09/11/19 0500  BP: 93/66 104/64 115/68   Pulse: 73 83 75   Resp: 16 16 17    Temp: 98.1 F (36.7 C) 99.4 F (37.4 C) 99 F (37.2 C)   TempSrc: Oral Oral Oral   SpO2: 100% 100% 99%   Weight:    44.3 kg  Height:        General: Pt is alert, awake, not in acute distress. Cachectic  Cardiovascular: RRR, S1/S2 +, no rubs, no gallops Respiratory: CTA bilaterally, no wheezing, no rhonchi Abdominal: Soft, NT, ND, bowel sounds + Extremities: no edema, no cyanosis    The results of significant diagnostics from this hospitalization (including imaging, microbiology, ancillary and laboratory) are listed below for reference.     Microbiology: Recent Results (from the past 240 hour(s))  SARS CORONAVIRUS 2 (TAT 6-24 HRS) Nasopharyngeal Nasopharyngeal Swab     Status: None   Collection Time: 09/09/19  7:43 PM   Specimen: Nasopharyngeal Swab  Result Value Ref Range Status   SARS Coronavirus 2 NEGATIVE NEGATIVE Final    Comment: (NOTE) SARS-CoV-2 target nucleic acids are NOT DETECTED. The SARS-CoV-2 RNA is generally detectable in upper and lower respiratory specimens during the acute phase of infection. Negative results do not preclude SARS-CoV-2 infection, do not rule out co-infections with other pathogens, and  should not be used as the sole basis for treatment or other patient management decisions. Negative results must be combined with clinical observations, patient history, and epidemiological information. The expected result is Negative. Fact Sheet for Patients: SugarRoll.be Fact Sheet for Healthcare Providers: https://www.woods-mathews.com/ This test is not yet approved or cleared by the [place] FDA and  has been authorized for detection and/or diagnosis of SARS-CoV-2 by FDA under an Emergency Use Authorization (EUA). This EUA will remain  in effect (meaning this test can be used) for the duration of the COVID-19 declaration under Section 56 4(b)(1) of the Act, 21 U.S.C. section 360bbb-3(b)(1), unless the authorization is terminated or revoked sooner. Performed at [facility], [place] [address], [place], [place] [address]      Labs: BNP (last 3 results) No results for input(s): BNP in the last 8760 hours. Basic Metabolic Panel: Recent Labs  Lab 09/09/19 1648 09/09/19 1943 09/10/19 0001 09/10/19 0529  NA 125* 137 139 134*  K 4.8 4.0 4.1 4.2  CL 90*  102 106 101  CO2 25 27 26 26   GLUCOSE 1,017* 470* 83 224*  BUN 29* 25* 20 18  CREATININE 1.14 0.92 0.68 0.59*  CALCIUM 8.8* 8.9 8.7* 8.5*  MG  --  2.1  --  1.9   Liver Function Tests: Recent Labs  Lab 09/09/19 1648 09/10/19 0529  AST 172* 112*  ALT 164* 113*  ALKPHOS 555* 421*  BILITOT 0.3 0.5  PROT 6.6 5.2*  ALBUMIN 3.6 2.7*   Recent Labs  Lab 09/10/19 0529  LIPASE 15   No results for input(s): AMMONIA in the last 168 hours. CBC: Recent Labs  Lab 09/09/19 1648 09/10/19 0529  WBC 6.2 6.6  HGB 11.9* 9.7*  HCT 37.5* 30.7*  MCV 94.7 92.2  PLT 194 173   Cardiac Enzymes: No results for input(s): CKTOTAL, CKMB, CKMBINDEX, TROPONINI in the last 168 hours. BNP: Invalid input(s): POCBNP CBG: Recent Labs  Lab 09/10/19 1146 09/10/19 1650 09/10/19 2108  09/11/19 0738 09/11/19 1141  GLUCAP 276* 191* 199* 101* 165*   D-Dimer No results for input(s): DDIMER in the last 72 hours. Hgb A1c Recent Labs    09/10/19 0529  HGBA1C >18.5*   Lipid Profile No results for input(s): CHOL, HDL, LDLCALC, TRIG, CHOLHDL, LDLDIRECT in the last 72 hours. Thyroid function studies Recent Labs    09/10/19 0529  TSH 2.407   Anemia work up No results for input(s): VITAMINB12, FOLATE, FERRITIN, TIBC, IRON, RETICCTPCT in the last 72 hours. Urinalysis    Component Value Date/Time   COLORURINE STRAW (A) 09/09/2019 1544   APPEARANCEUR CLEAR 09/09/2019 1544   LABSPEC 1.025 09/09/2019 1544   PHURINE 5.0 09/09/2019 1544   GLUCOSEU >=500 (A) 09/09/2019 1544   HGBUR SMALL (A) 09/09/2019 1544   BILIRUBINUR NEGATIVE 09/09/2019 1544   BILIRUBINUR negative 03/15/2018 1049   BILIRUBINUR negative 12/26/2017 0941   KETONESUR NEGATIVE 09/09/2019 1544   PROTEINUR NEGATIVE 09/09/2019 1544   UROBILINOGEN 0.2 03/15/2018 1049   UROBILINOGEN 0.2 12/08/2010 1613   NITRITE NEGATIVE 09/09/2019 1544   LEUKOCYTESUR SMALL (A) 09/09/2019 1544   Sepsis Labs Invalid input(s): PROCALCITONIN,  WBC,  LACTICIDVEN Microbiology Recent Results (from the past 240 hour(s))  SARS CORONAVIRUS 2 (TAT 6-24 HRS) Nasopharyngeal Nasopharyngeal Swab     Status: None   Collection Time: 09/09/19  7:43 PM   Specimen: Nasopharyngeal Swab  Result Value Ref Range Status   SARS Coronavirus 2 NEGATIVE NEGATIVE Final    Comment: (NOTE) SARS-CoV-2 target nucleic acids are NOT DETECTED. The SARS-CoV-2 RNA is generally detectable in upper and lower respiratory specimens during the acute phase of infection. Negative results do not preclude SARS-CoV-2 infection, do not rule out co-infections with other pathogens, and should not be used as the sole basis for treatment or other patient management decisions. Negative results must be combined with clinical observations, patient history, and  epidemiological information. The expected result is Negative. Fact Sheet for Patients: SugarRoll.be Fact Sheet for Healthcare Providers: https://www.woods-mathews.com/ This test is not yet approved or cleared by the [place] FDA and  has been authorized for detection and/or diagnosis of SARS-CoV-2 by FDA under an Emergency Use Authorization (EUA). This EUA will remain  in effect (meaning this test can be used) for the duration of the COVID-19 declaration under Section 56 4(b)(1) of the Act, 21 U.S.C. section 360bbb-3(b)(1), unless the authorization is terminated or revoked sooner. Performed at [facility], [place] [address] [place], [place] [address]      Time coordinating discharge: 68  minutes  SIGNED:   Cordelia Poche, MD [facility] 09/11/2019, 12:49 PM

## 2019-09-11 NOTE — Progress Notes (Signed)
Patient given discharge instructions, and verbalized an understanding of all discharge instructions.  Patient agrees with discharge plan, and is being discharged in stable medical condition.  Patient given transportation via wheelchair.  Patient given 3 [facility]  Patient demonstrated how to give himself an insulin injection.  States that he has done shots before and understands how to check his blood sugar.  Dietician came by and did diet education with the patient.

## 2019-09-18 ENCOUNTER — Other Ambulatory Visit: Payer: Self-pay | Admitting: Critical Care Medicine

## 2019-09-18 ENCOUNTER — Encounter: Payer: Self-pay | Admitting: Critical Care Medicine

## 2019-09-18 ENCOUNTER — Other Ambulatory Visit: Payer: Self-pay

## 2019-09-18 MED ORDER — AMLODIPINE BESYLATE 5 MG PO TABS: 5.0000 mg | ORAL_TABLET | Freq: Every day | ORAL | 0 refills | Status: DC

## 2019-09-18 MED FILL — AMLODIPINE BESYLATE 5 MG TA: 5 | 30 days supply | Qty: 30 | Fill #0

## 2019-09-18 NOTE — Congregational Nurse Program (Signed)
Rodney Barber seen at [facility] walk-in clinic with Dr. [name]. Medication reviewed with patient, dosage and administration reviewed, with his acknowledgement of understanding. Rx will be picked up at [facility] and delivered by CN tomorrow. Transportation will be provided for appointment [facility] 2/15

## 2019-09-19 NOTE — Progress Notes (Signed)
Patient ID: Rodney Barber, male   DOB: Apr 26, 1966, 54 y.o.   MRN: [id number] This is a 54 year old male who has been in the [facility] for about 2 months.  He has severe type 2 diabetes with severe wasting illness with low muscle mass.  Patient also has history of hypertension, chronic pancreatitis alcohol induced, chronic liver disease chronic kidney disease, protein calorie malnutrition, ongoing alcohol use and substance use.  The patient was just discharged with a bout of hyperosmolar nonketotic diabetes with severe dehydration severe malnutrition acute kidney injury  Below is the discharge summary  Admit date: 09/09/2019 Discharge date: 09/11/2019  Admitted From: Shelter Disposition: Shelter  Recommendations for Outpatient Follow-up:  1. Follow up with PCP on 09/22/2019 2. Please obtain CMP/CBC in one week 3. Please follow up on the following pending results: None  Home Health: None Equipment/Devices: Cane  Discharge Condition: Stable CODE STATUS: DNR Diet recommendation: Carb modified   Brief/Interim Summary:  Admission HPI written by Newton Pigg, DO   Chief Complaint:Generalized weakness  Rodney Barber a 54 y.o.malewith medical history significant fortype 2 diabetes mellitus, essential hypertension, chronic tobacco abuse,who is [facility] to [facility] on 2/2/2021with hyperglycemic hyperosmolar nonketotic stateafter presenting from home to [facility] Department complaining of generalized weakness.  The patient reports 3 to 4 days of generalized weakness in the absence of any acute focal weakness or acute focal neurologic deficits.Denies any associated acute numbness,paresthesias, change in vision, diplopia, slurred speech, facial droop, dysphagia, or vertigo.He notes associated polyuria over the last week, but denies any associated dysuria or gross hematuria.Denies any recent chest pain, shortness of breath,  palpitations, diaphoresis, or nausea/vomiting.  Denies anyassociatedsubjective fever, chills, rigors, or generalized myalgias. Denies any recent headache, neck stiffness, rhinitis, rhinorrhea, sore throat, cough, abdominal pain, diarrhea, or rash. No recent traveling or known COVID-19 exposures.  Per chart review, at the time of a prior hospitalization, the patient's home insulin was discontinued given findings of hypoglycemia. In its place, the patient was started on Metformin and glipizide.However, following initiation of these 2 oral hypoglycemic agents, the patient reports that he developed mild abdominal discomfort as well as diarrhea, prompting the patient to completely discontinue both his Metformin glipizide approximately 1 month ago, without interval resumption of insulin or addition of another oral hypoglycemic agent. Additionally, he acknowledges that he has been checking his blood sugars very infrequently following aforementioneddiscontinuation of Metformin and glipizide 1 month ago.    Hospital course:  Hyperosmolar hyperglycemic state Secondary to medication non-adherence. Patient states he has not been taking his diabetes medication secondary to diarrhea. Patient with a blood sugar of 1017 on admission and was started on an insulin drip. No anion gap elevation (AG of 10) or urine ketones present. Insulin drip transitioned to Levemir 8 units BID. Blood sugar readings remained less than 180 g/dL. Hemoglobin A1C of >18.5%. Patient to be discharged on new prescription of Levemir 8 units BID. Discontinue metformin and glipizide. He will need close follow-up with PCP and this was discussed with him.  Elevated AST/ALT/Alkaline phosphatase This appears to be recurrent. Unsure of etiology. No abdominal pain. Patient has a previous RUQ from 2010 which was significant for sludge. AST/ALT/Alkaline phosphatase decreased today. GGT elevated. RUQ significant for chronically dilated CBD  with no acute process identified. Will need outpatient monitoring.  Generalized weakness Possibly related to hyperglycemia. Also likely related to overall malnutrition of unknown severity. Physical therapy consulted and patient provided with a walking cane.  Underweight Severe malnutrition Body  mass index is 14.85 kg/m.Weight has been low chronically but acutely worsened in the last few months. Dietitian consulted and is recommending low carb protein supplementation. Need for adequate nutrition discussed with patient.  AKI Baseline creatinine of 0.8. Creatinine of 1.14 on admission and now resolved with IV fluids.  Essential hypertension Patient is on amlodipine and lisinopril as an outpatient. Lisinopril held on admission secondary to AKI. Continue home regimen.  Tobacco use Counseled on admission. Continue nicotine patch  Discharge Diagnoses:  Principal Problem:   Hyperosmolar hyperglycemic state (HHS) ([name]) Active Problems:   Severe malnutrition ([place])   AKI (acute kidney injury) ([name])   Hyperglycemia without ketosis   Generalized weakness   Dehydration   Transaminitis  This patient does have a follow-up visit in [name] on February 15 with [name]  Upon review with the patient all of his medications is apparent he is not been taking any of his medicines since he left the hospital.  Furthermore he was given a new prescription for Levemir at 8 units twice daily given a bottle of this with syringes and a new glucose meter for which she has no concept on terms of how to actually use the meter and has not actually been giving himself any insulin.  He has nearly empty bottles of glipizide and Metformin and was told in the hospital to stop both of those medications.  On exam blood pressure 160/84 the CBG reading today was not recordable due to severe hyperglycemia  This patient is a very thin African-American male in no acute distress extremely weak  with very high fall risk  We administered 20 units of his Levemir to him today and the nurse and I demonstrated how to self administer the Levemir and he will obtain this from the staff  We will increase his Levemir to 10 units twice daily and also I gave him refills on his amlodipine 5 mg daily  He will continue his atorvastatin 20 mg daily which she has a good supply of and lisinopril 20 mg daily and the Lexapro 5 mg daily  We went over each and every one of his medicines and I made a medication list out for him to indicate what he was to do and he is to keep his appointment with [name] that is upcoming

## 2019-09-22 ENCOUNTER — Ambulatory Visit: Payer: Self-pay | Attending: Nurse Practitioner | Admitting: Nurse Practitioner

## 2019-09-22 ENCOUNTER — Other Ambulatory Visit: Payer: Self-pay

## 2019-10-21 ENCOUNTER — Other Ambulatory Visit: Payer: Self-pay

## 2019-10-21 ENCOUNTER — Emergency Department (HOSPITAL_COMMUNITY): Payer: Self-pay

## 2019-10-21 ENCOUNTER — Encounter (HOSPITAL_COMMUNITY): Payer: Self-pay

## 2019-10-21 ENCOUNTER — Inpatient Hospital Stay (HOSPITAL_COMMUNITY)
Admission: EM | Admit: 2019-10-21 | Discharge: 2019-10-29 | DRG: 637 | Disposition: A | Discharge status: Home / Self Care | Payer: Self-pay | Attending: Internal Medicine | Admitting: Internal Medicine

## 2019-10-21 DIAGNOSIS — D638 Anemia in other chronic diseases classified elsewhere: Secondary | ICD-10-CM | POA: Diagnosis present

## 2019-10-21 DIAGNOSIS — J322 Chronic ethmoidal sinusitis: Secondary | ICD-10-CM | POA: Diagnosis present

## 2019-10-21 DIAGNOSIS — R945 Abnormal results of liver function studies: Secondary | ICD-10-CM | POA: Diagnosis present

## 2019-10-21 DIAGNOSIS — Z9114 Patient's other noncompliance with medication regimen: Secondary | ICD-10-CM

## 2019-10-21 DIAGNOSIS — E1169 Type 2 diabetes mellitus with other specified complication: Secondary | ICD-10-CM

## 2019-10-21 DIAGNOSIS — K529 Noninfective gastroenteritis and colitis, unspecified: Secondary | ICD-10-CM | POA: Diagnosis present

## 2019-10-21 DIAGNOSIS — I313 Pericardial effusion (noninflammatory): Secondary | ICD-10-CM | POA: Diagnosis present

## 2019-10-21 DIAGNOSIS — I5031 Acute diastolic (congestive) heart failure: Secondary | ICD-10-CM | POA: Diagnosis present

## 2019-10-21 DIAGNOSIS — Z20822 Contact with and (suspected) exposure to covid-19: Secondary | ICD-10-CM | POA: Diagnosis present

## 2019-10-21 DIAGNOSIS — R739 Hyperglycemia, unspecified: Secondary | ICD-10-CM | POA: Diagnosis present

## 2019-10-21 DIAGNOSIS — Z66 Do not resuscitate: Secondary | ICD-10-CM | POA: Diagnosis present

## 2019-10-21 DIAGNOSIS — J320 Chronic maxillary sinusitis: Secondary | ICD-10-CM | POA: Diagnosis present

## 2019-10-21 DIAGNOSIS — Y9301 Activity, walking, marching and hiking: Secondary | ICD-10-CM | POA: Diagnosis present

## 2019-10-21 DIAGNOSIS — K769 Liver disease, unspecified: Secondary | ICD-10-CM | POA: Diagnosis present

## 2019-10-21 DIAGNOSIS — Z720 Tobacco use: Secondary | ICD-10-CM | POA: Diagnosis present

## 2019-10-21 DIAGNOSIS — R7989 Other specified abnormal findings of blood chemistry: Secondary | ICD-10-CM

## 2019-10-21 DIAGNOSIS — Z794 Long term (current) use of insulin: Secondary | ICD-10-CM

## 2019-10-21 DIAGNOSIS — K860 Alcohol-induced chronic pancreatitis: Secondary | ICD-10-CM | POA: Diagnosis present

## 2019-10-21 DIAGNOSIS — I351 Nonrheumatic aortic (valve) insufficiency: Secondary | ICD-10-CM | POA: Diagnosis present

## 2019-10-21 DIAGNOSIS — Z833 Family history of diabetes mellitus: Secondary | ICD-10-CM

## 2019-10-21 DIAGNOSIS — Z79899 Other long term (current) drug therapy: Secondary | ICD-10-CM

## 2019-10-21 DIAGNOSIS — Z681 Body mass index (BMI) 19 or less, adult: Secondary | ICD-10-CM

## 2019-10-21 DIAGNOSIS — G9341 Metabolic encephalopathy: Secondary | ICD-10-CM | POA: Diagnosis present

## 2019-10-21 DIAGNOSIS — W1830XA Fall on same level, unspecified, initial encounter: Secondary | ICD-10-CM | POA: Diagnosis present

## 2019-10-21 DIAGNOSIS — I110 Hypertensive heart disease with heart failure: Secondary | ICD-10-CM | POA: Diagnosis present

## 2019-10-21 DIAGNOSIS — E871 Hypo-osmolality and hyponatremia: Secondary | ICD-10-CM | POA: Diagnosis present

## 2019-10-21 DIAGNOSIS — F1010 Alcohol abuse, uncomplicated: Secondary | ICD-10-CM | POA: Diagnosis present

## 2019-10-21 DIAGNOSIS — E119 Type 2 diabetes mellitus without complications: Secondary | ICD-10-CM | POA: Diagnosis present

## 2019-10-21 DIAGNOSIS — Z9119 Patient's noncompliance with other medical treatment and regimen: Secondary | ICD-10-CM

## 2019-10-21 DIAGNOSIS — E43 Unspecified severe protein-calorie malnutrition: Secondary | ICD-10-CM | POA: Diagnosis present

## 2019-10-21 DIAGNOSIS — K861 Other chronic pancreatitis: Secondary | ICD-10-CM | POA: Diagnosis present

## 2019-10-21 DIAGNOSIS — F17210 Nicotine dependence, cigarettes, uncomplicated: Secondary | ICD-10-CM | POA: Diagnosis present

## 2019-10-21 DIAGNOSIS — T68XXXA Hypothermia, initial encounter: Secondary | ICD-10-CM

## 2019-10-21 DIAGNOSIS — Z91199 Patient's noncompliance with other medical treatment and regimen due to unspecified reason: Secondary | ICD-10-CM

## 2019-10-21 DIAGNOSIS — R8281 Pyuria: Secondary | ICD-10-CM | POA: Diagnosis present

## 2019-10-21 DIAGNOSIS — E1100 Type 2 diabetes mellitus with hyperosmolarity without nonketotic hyperglycemic-hyperosmolar coma (NKHHC): Principal | ICD-10-CM

## 2019-10-21 DIAGNOSIS — E1165 Type 2 diabetes mellitus with hyperglycemia: Secondary | ICD-10-CM

## 2019-10-21 DIAGNOSIS — R680 Hypothermia, not associated with low environmental temperature: Secondary | ICD-10-CM | POA: Diagnosis present

## 2019-10-21 DIAGNOSIS — Z590 Homelessness: Secondary | ICD-10-CM

## 2019-10-21 LAB — URINALYSIS, ROUTINE W REFLEX MICROSCOPIC
Bacteria, UA: NONE SEEN
Bilirubin Urine: NEGATIVE
Glucose, UA: >=500 mg/dL — AB
Hgb urine dipstick: NEGATIVE
Ketones, ur: NEGATIVE mg/dL
Nitrite: NEGATIVE
Protein, ur: 30 mg/dL — AB
Specific Gravity, Urine: 1.026 (ref 1.005–1.030)
WBC, UA: >50 WBC/hpf — ABNORMAL HIGH (ref 0–5)
pH: 6 (ref 5.0–8.0)

## 2019-10-21 LAB — CBC WITH DIFFERENTIAL/PLATELET
Abs Immature Granulocytes: 0.03 10*3/uL (ref 0.00–0.07)
Basophils Absolute: 0 10*3/uL (ref 0.0–0.1)
Basophils Relative: 0 %
Eosinophils Absolute: 0 10*3/uL (ref 0.0–0.5)
Eosinophils Relative: 0 %
HCT: 35.8 % — ABNORMAL LOW (ref 39.0–52.0)
Hemoglobin: 11.1 g/dL — ABNORMAL LOW (ref 13.0–17.0)
Immature Granulocytes: 0 %
Lymphocytes Relative: 13 %
Lymphs Abs: 1.1 10*3/uL (ref 0.7–4.0)
MCH: 29.8 pg (ref 26.0–34.0)
MCHC: 31 g/dL (ref 30.0–36.0)
MCV: 96 fL (ref 80.0–100.0)
Monocytes Absolute: 0.3 10*3/uL (ref 0.1–1.0)
Monocytes Relative: 3 %
Neutro Abs: 7 10*3/uL (ref 1.7–7.7)
Neutrophils Relative %: 84 %
Platelets: 254 10*3/uL (ref 150–400)
RBC: 3.73 MIL/uL — ABNORMAL LOW (ref 4.22–5.81)
RDW: 14.4 % (ref 11.5–15.5)
WBC: 8.4 10*3/uL (ref 4.0–10.5)
nRBC: 0 % (ref 0.0–0.2)

## 2019-10-21 LAB — BLOOD GAS, VENOUS
Acid-Base Excess: 2.4 mmol/L — ABNORMAL HIGH (ref 0.0–2.0)
Bicarbonate: 30.6 mmol/L — ABNORMAL HIGH (ref 20.0–28.0)
O2 Saturation: 38.3 %
Patient temperature: 98.6
pCO2, Ven: 69.5 mmHg — ABNORMAL HIGH (ref 44.0–60.0)
pH, Ven: 7.266 (ref 7.250–7.430)
pO2, Ven: <31 mmHg — CL (ref 32.0–45.0)

## 2019-10-21 LAB — BASIC METABOLIC PANEL WITH GFR
Anion gap: 6 (ref 5–15)
BUN: 14 mg/dL (ref 6–20)
CO2: 28 mmol/L (ref 22–32)
Calcium: 8 mg/dL — ABNORMAL LOW (ref 8.9–10.3)
Chloride: 104 mmol/L (ref 98–111)
Creatinine, Ser: 0.69 mg/dL (ref 0.61–1.24)
GFR calc Af Amer: >60 mL/min
GFR calc non Af Amer: >60 mL/min
Glucose, Bld: 519 mg/dL (ref 70–99)
Potassium: 3.6 mmol/L (ref 3.5–5.1)
Sodium: 138 mmol/L (ref 135–145)

## 2019-10-21 LAB — CBG MONITORING, ED
Glucose-Capillary: >600 mg/dL (ref 70–99)
Glucose-Capillary: >600 mg/dL (ref 70–99)
Glucose-Capillary: 560 mg/dL (ref 70–99)
Glucose-Capillary: >600 mg/dL (ref 70–99)
Glucose-Capillary: 395 mg/dL — ABNORMAL HIGH (ref 70–99)
Glucose-Capillary: 277 mg/dL — ABNORMAL HIGH (ref 70–99)
Glucose-Capillary: 185 mg/dL — ABNORMAL HIGH (ref 70–99)
Glucose-Capillary: 103 mg/dL — ABNORMAL HIGH (ref 70–99)
Glucose-Capillary: 69 mg/dL — ABNORMAL LOW (ref 70–99)
Glucose-Capillary: 109 mg/dL — ABNORMAL HIGH (ref 70–99)

## 2019-10-21 LAB — COMPREHENSIVE METABOLIC PANEL
ALT: 143 U/L — ABNORMAL HIGH (ref 0–44)
AST: 113 U/L — ABNORMAL HIGH (ref 15–41)
Albumin: 2.9 g/dL — ABNORMAL LOW (ref 3.5–5.0)
Alkaline Phosphatase: 722 U/L — ABNORMAL HIGH (ref 38–126)
Anion gap: 9 (ref 5–15)
BUN: 16 mg/dL (ref 6–20)
CO2: 28 mmol/L (ref 22–32)
Calcium: 8.2 mg/dL — ABNORMAL LOW (ref 8.9–10.3)
Chloride: 93 mmol/L — ABNORMAL LOW (ref 98–111)
Creatinine, Ser: 0.74 mg/dL (ref 0.61–1.24)
GFR calc Af Amer: >60 mL/min (ref >60)
GFR calc non Af Amer: >60 mL/min (ref >60)
Glucose, Bld: 947 mg/dL (ref 70–99)
Potassium: 4.1 mmol/L (ref 3.5–5.1)
Sodium: 130 mmol/L — ABNORMAL LOW (ref 135–145)
Total Bilirubin: 0.6 mg/dL (ref 0.3–1.2)
Total Protein: 6.2 g/dL — ABNORMAL LOW (ref 6.5–8.1)

## 2019-10-21 LAB — T4, FREE: Free T4: 0.81 ng/dL (ref 0.61–1.12)

## 2019-10-21 LAB — ETHANOL: Alcohol, Ethyl (B): <10 mg/dL (ref <10)

## 2019-10-21 LAB — LACTIC ACID, PLASMA
Lactic Acid, Venous: 2.7 mmol/L (ref 0.5–1.9)
Lactic Acid, Venous: 3.1 mmol/L (ref 0.5–1.9)

## 2019-10-21 LAB — BETA-HYDROXYBUTYRIC ACID: Beta-Hydroxybutyric Acid: 0.19 mmol/L (ref 0.05–0.27)

## 2019-10-21 LAB — SARS CORONAVIRUS 2 (TAT 6-24 HRS): SARS Coronavirus 2: NEGATIVE

## 2019-10-21 LAB — TSH: TSH: 2.097 u[IU]/mL (ref 0.350–4.500)

## 2019-10-21 LAB — MAGNESIUM: Magnesium: 1.7 mg/dL (ref 1.7–2.4)

## 2019-10-21 MED ORDER — DEXTROSE 50 % IV SOLN
0.0000 mL | INTRAVENOUS | Status: DC | PRN
  Administered 2019-10-21: 10 mL via INTRAVENOUS
  Filled 2019-10-21 (×2): qty 50

## 2019-10-21 MED ORDER — LACTATED RINGERS IV BOLUS
1000.0000 mL | Freq: Once | INTRAVENOUS | Status: AC
  Administered 2019-10-21: 1000 mL via INTRAVENOUS

## 2019-10-21 MED ORDER — SODIUM CHLORIDE 0.9 % IV SOLN
2.0000 g | Freq: Once | INTRAVENOUS | Status: AC
  Administered 2019-10-21: 2 g via INTRAVENOUS
  Filled 2019-10-21: qty 2

## 2019-10-21 MED ORDER — METRONIDAZOLE IN NACL 5-0.79 MG/ML-% IV SOLN
500.0000 mg | Freq: Once | INTRAVENOUS | Status: AC
  Administered 2019-10-21: 500 mg via INTRAVENOUS
  Filled 2019-10-21: qty 100

## 2019-10-21 MED ORDER — INSULIN ASPART 100 UNIT/ML ~~LOC~~ SOLN
0.0000 [IU] | Freq: Three times a day (TID) | SUBCUTANEOUS | Status: DC
  Administered 2019-10-22: 3 [IU] via SUBCUTANEOUS
  Administered 2019-10-22: 1 [IU] via SUBCUTANEOUS
  Administered 2019-10-23 (×3): 2 [IU] via SUBCUTANEOUS
  Administered 2019-10-24: 9 [IU] via SUBCUTANEOUS
  Administered 2019-10-24: 5 [IU] via SUBCUTANEOUS
  Administered 2019-10-25: 3 [IU] via SUBCUTANEOUS
  Administered 2019-10-25: 2 [IU] via SUBCUTANEOUS
  Administered 2019-10-26 (×2): 7 [IU] via SUBCUTANEOUS
  Administered 2019-10-26: 2 [IU] via SUBCUTANEOUS
  Administered 2019-10-27: 5 [IU] via SUBCUTANEOUS
  Administered 2019-10-27: 7 [IU] via SUBCUTANEOUS
  Administered 2019-10-27: 5 [IU] via SUBCUTANEOUS
  Administered 2019-10-28: 1 [IU] via SUBCUTANEOUS
  Administered 2019-10-28: 2 [IU] via SUBCUTANEOUS
  Administered 2019-10-28: 3 [IU] via SUBCUTANEOUS
  Administered 2019-10-29: 2 [IU] via SUBCUTANEOUS
  Administered 2019-10-29: 3 [IU] via SUBCUTANEOUS
  Filled 2019-10-21: qty 0.09

## 2019-10-21 MED ORDER — FUROSEMIDE 10 MG/ML IJ SOLN
20.0000 mg | Freq: Two times a day (BID) | INTRAMUSCULAR | Status: DC
  Administered 2019-10-21 – 2019-10-24 (×6): 20 mg via INTRAVENOUS
  Filled 2019-10-21: qty 2
  Filled 2019-10-21 (×2): qty 4
  Filled 2019-10-21 (×3): qty 2

## 2019-10-21 MED ORDER — LACTATED RINGERS IV SOLN: INTRAVENOUS | Status: DC

## 2019-10-21 MED ORDER — SODIUM CHLORIDE 0.9 % IV SOLN
1.0000 g | INTRAVENOUS | Status: DC
  Administered 2019-10-22: 1 g via INTRAVENOUS
  Filled 2019-10-21: qty 10

## 2019-10-21 MED ORDER — FOLIC ACID 1 MG PO TABS
1.0000 mg | ORAL_TABLET | Freq: Every day | ORAL | Status: DC
  Administered 2019-10-21 – 2019-10-29 (×9): 1 mg via ORAL
  Filled 2019-10-21 (×9): qty 1

## 2019-10-21 MED ORDER — NICOTINE 14 MG/24HR TD PT24
14.0000 mg | MEDICATED_PATCH | Freq: Every day | TRANSDERMAL | Status: DC
  Administered 2019-10-21 – 2019-10-29 (×9): 14 mg via TRANSDERMAL
  Filled 2019-10-21 (×10): qty 1

## 2019-10-21 MED ORDER — DEXTROSE-NACL 5-0.45 % IV SOLN: INTRAVENOUS | Status: DC

## 2019-10-21 MED ORDER — POTASSIUM CHLORIDE 10 MEQ/100ML IV SOLN
10.0000 meq | INTRAVENOUS | Status: AC
  Administered 2019-10-21 (×3): 10 meq via INTRAVENOUS
  Filled 2019-10-21 (×3): qty 100

## 2019-10-21 MED ORDER — VANCOMYCIN HCL IN DEXTROSE 1-5 GM/200ML-% IV SOLN
1000.0000 mg | Freq: Once | INTRAVENOUS | Status: AC
  Administered 2019-10-21: 1000 mg via INTRAVENOUS
  Filled 2019-10-21: qty 200

## 2019-10-21 MED ORDER — INSULIN ASPART 100 UNIT/ML ~~LOC~~ SOLN
0.0000 [IU] | Freq: Every day | SUBCUTANEOUS | Status: DC
  Administered 2019-10-24: 3 [IU] via SUBCUTANEOUS
  Administered 2019-10-25: 2 [IU] via SUBCUTANEOUS
  Administered 2019-10-27: 3 [IU] via SUBCUTANEOUS
  Filled 2019-10-21: qty 0.05

## 2019-10-21 MED ORDER — LISINOPRIL 20 MG PO TABS
20.0000 mg | ORAL_TABLET | Freq: Every day | ORAL | Status: DC
  Administered 2019-10-21 – 2019-10-29 (×9): 20 mg via ORAL
  Filled 2019-10-21 (×10): qty 1

## 2019-10-21 MED ORDER — POTASSIUM CHLORIDE 10 MEQ/100ML IV SOLN
10.0000 meq | INTRAVENOUS | Status: AC
  Administered 2019-10-21 (×2): 10 meq via INTRAVENOUS
  Filled 2019-10-21 (×2): qty 100

## 2019-10-21 MED ORDER — ESCITALOPRAM OXALATE 10 MG PO TABS
5.0000 mg | ORAL_TABLET | Freq: Every day | ORAL | Status: DC
  Administered 2019-10-21 – 2019-10-29 (×9): 5 mg via ORAL
  Filled 2019-10-21 (×2): qty 1
  Filled 2019-10-21: qty 0.5
  Filled 2019-10-21 (×7): qty 1

## 2019-10-21 MED ORDER — ENOXAPARIN SODIUM 40 MG/0.4ML ~~LOC~~ SOLN
40.0000 mg | SUBCUTANEOUS | Status: DC
  Administered 2019-10-22 – 2019-10-28 (×7): 40 mg via SUBCUTANEOUS
  Filled 2019-10-21 (×8): qty 0.4

## 2019-10-21 MED ORDER — INSULIN DETEMIR 100 UNIT/ML ~~LOC~~ SOLN
8.0000 [IU] | Freq: Two times a day (BID) | SUBCUTANEOUS | Status: DC
  Administered 2019-10-22: 8 [IU] via SUBCUTANEOUS
  Filled 2019-10-21 (×2): qty 0.08

## 2019-10-21 MED ORDER — ATORVASTATIN CALCIUM 20 MG PO TABS
20.0000 mg | ORAL_TABLET | Freq: Every day | ORAL | Status: DC
  Administered 2019-10-21 – 2019-10-28 (×8): 20 mg via ORAL
  Filled 2019-10-21 (×9): qty 1

## 2019-10-21 MED ORDER — AMLODIPINE BESYLATE 10 MG PO TABS
10.0000 mg | ORAL_TABLET | Freq: Every day | ORAL | Status: DC
  Filled 2019-10-21: qty 1

## 2019-10-21 MED ORDER — INSULIN REGULAR(HUMAN) IN NACL 100-0.9 UT/100ML-% IV SOLN
INTRAVENOUS | Status: DC
  Administered 2019-10-21: 9.5 [IU]/h via INTRAVENOUS
  Filled 2019-10-21: qty 100

## 2019-10-21 MED ORDER — GLUCERNA SHAKE PO LIQD
237.0000 mL | Freq: Three times a day (TID) | ORAL | Status: DC
  Administered 2019-10-22 – 2019-10-29 (×20): 237 mL via ORAL
  Filled 2019-10-21 (×26): qty 237

## 2019-10-21 MED ORDER — SODIUM CHLORIDE 0.9 % IV SOLN: INTRAVENOUS | Status: DC

## 2019-10-21 NOTE — ED Notes (Signed)
Patient has a open area on his lower right leg.  I made nurse aware

## 2019-10-21 NOTE — Progress Notes (Signed)
A consult was received from an ED physician for Cefepime and Vancomycin per pharmacy dosing.    The patient's profile has been reviewed for ht/wt/allergies/indication/available labs.    A one time order has been placed for Vancomycin 1g and Cefepime 2g IV.  Further antibiotics/pharmacy consults should be ordered by admitting physician if indicated.                       Thank you, Maryellen Pile, PharmD 10/21/2019  2:54 PM

## 2019-10-21 NOTE — ED Notes (Signed)
Pt ambulatory to bathroom

## 2019-10-21 NOTE — ED Notes (Signed)
Date and time results received: 10/21/19 1527 (use smartphrase ".now" to insert current time)  Test: Glucose Critical Value: 947  Name of Provider Notified: Dr. Criss Alvine  Orders Received? Or Actions Taken?: Orders Received - See Orders for details

## 2019-10-21 NOTE — H&P (Addendum)
History and Physical    DOA: 10/21/2019  PCP: Patient, No Pcp Per  Patient coming from: [place] home, Animator Complaint: Elevated blood glucose levels  HPI: Rodney Barber is a 54 y.o. male with history h/o diabetes mellitus 2, hypertension, pancreatitis, tobacco use, homelessness who resides at a shelter home presents with complaints of dizziness that he has experienced over the last couple of days.  He fell yesterday while walking and hit his head.  Reports mild headache.  He states he usually has dizziness when his blood glucose is out of control.  He has been noncompliant with home insulin regimen lately and states he was going in and out of the shelter home (does not elaborate).  Of note, patient was admitted to this [facility] from 2/2--09/11/2019 for hyperosmolar nonketotic hyperglycemia.  Previously he was taken off insulin and concern for hypoglycemia and was started on oral hypoglycemic agents but at the time of last admission he was transitioned to insulin and discharged on Levemir 8 units twice daily.  Metformin/glipizide were discontinued.  Patient noted to have leg edema and states he had noticed over the last 2 weeks. Review of Systems: As per HPI otherwise 10 point review of systems negative. Denies anyassociatedsubjective fever, chills, rigors, or generalized myalgias. Denies any recent headache, neck stiffness, rhinitis, rhinorrhea, sore throat, cough, abdominal pain, diarrhea, or rash. No recent traveling or known COVID-19 exposures. ED course: Hypothermic with temp 93.9 F on arrival, improved to 95.1 with bear hugger.  Pulse 63, respiratory rate 15, BP 144/82, pulse ox 100% on room air.  Lab work shows WBC 8.4, hemoglobin 11.1, platelets 254, sodium 130, chloride 93, bicarb 28, BUN 16, creatinine 0.74.  Blood glucose 947.  Lactate 2.7.  Chest x-ray unremarkable.  UA with pyuria but no bacteriuria, nitrite negative.  CT head with no acute intracranial injury but  reported hygromas new since July 2020.  Also reported mild maxillary/ethmoidal sinus disease,?  Soft tissue calcification    Past Medical History:  Diagnosis Date  . Diabetes mellitus   . Hypertension   . Pancreatitis     Past Surgical History:  Procedure Laterality Date  . ERCP  06/27/2011   Procedure: ENDOSCOPIC RETROGRADE CHOLANGIOPANCREATOGRAPHY (ERCP);  Surgeon: Jeryl Columbia, MD;  Location: [facility];  Service: Endoscopy;  Laterality: N/A;  . ERCP W/ PLASTIC STENT PLACEMENT  03/2011  . IR US GUIDE BX ASP/DRAIN  05/16/2019  . TEE WITHOUT CARDIOVERSION N/A 05/20/2019   Procedure: TRANSESOPHAGEAL ECHOCARDIOGRAM (TEE);  Surgeon: [name], MD;  Location: [facility];  Service: Cardiovascular;  Laterality: N/A;    Social history:  reports that he has been smoking cigarettes. He has a 15.00 pack-year smoking history. He has never used smokeless tobacco. He reports previous drug use. Drugs: "Crack" cocaine, Other-see comments, and Cocaine. He reports that he does not drink alcohol.   No Known Allergies  Family History  Problem Relation Age of Onset  . Diabetes Mother   . Diabetes Brother       Prior to Admission medications   Medication Sig Start Date End Date Taking? Authorizing Provider  Blood Glucose Monitoring Suppl (TRUERESULT BLOOD GLUCOSE) w/Device KIT uad 09/11/19  Yes Mariel Aloe, MD  feeding supplement, GLUCERNA SHAKE, (GLUCERNA SHAKE) LIQD Take 237 mLs by mouth 3 (three) times daily between meals. 09/11/19  Yes Mariel Aloe, MD  folic acid (FOLVITE) 1 MG tablet Take 1 tablet (1 mg total) by mouth daily. 06/25/19  Yes Sheth,  Devam P, MD  glucose blood (TRUE METRIX BLOOD GLUCOSE TEST) test strip Use as instructed 09/11/19  Yes Mariel Aloe, MD  insulin detemir (LEVEMIR) 100 UNIT/ML injection Inject 0.08 mLs (8 Units total) into the skin 2 (two) times daily. 09/11/19  Yes Mariel Aloe, MD  Insulin Syringe-Needle U-100 (INSULIN SYRINGE .3CC/31GX5/16") 31G  X 5/16" 0.3 ML MISC Draw 8 units of Levemir with syringe twice per day 09/11/19  Yes Mariel Aloe, MD  lactulose (CHRONULAC) 10 GM/15ML solution Take 30 mLs (20 g total) by mouth 2 (two) times daily. 03/06/19  Yes Aline August, MD  nicotine (NICODERM CQ - DOSED IN MG/24 HOURS) 14 mg/24hr patch Place 1 patch (14 mg total) onto the skin daily. 06/26/19  Yes Sheth, Vickii Chafe, MD  TRUEplus Lancets 28G MISC uad 09/11/19  Yes Mariel Aloe, MD  amLODipine (NORVASC) 10 MG tablet Take 10 mg by mouth daily. 09/19/19   [provider]  amLODipine (NORVASC) 5 MG tablet Take 1 tablet (5 mg total) by mouth daily. 09/18/19 10/18/19  Elsie Stain, MD  atorvastatin (LIPITOR) 20 MG tablet Take 1 tablet (20 mg total) by mouth daily at 6 PM. 06/25/19 09/09/19  Sheth, Devam P, MD  escitalopram (LEXAPRO) 10 MG tablet Take 0.5 tablets (5 mg total) by mouth daily. 06/25/19 09/09/19  Sheth, Vickii Chafe, MD  lisinopril (ZESTRIL) 20 MG tablet Take 1 tablet (20 mg total) by mouth daily. 06/26/19 09/09/19  Vicenta Dunning, MD    Physical Exam: Vitals:   10/21/19 1545 10/21/19 1600 10/21/19 1700 10/21/19 1719  BP: (!) 167/91 (!) 175/83 (!) 152/84   Pulse: (!) 56 (!) 57 (!) 56   Resp: 12 16 15    Temp:    (!) 95.1 F (35.1 C)  TempSrc:    Rectal  SpO2: 100% 100% 100%     Constitutional: Thin built male, appears drowsy but easily arousable and oriented x3 eyes: PERRL, lids and conjunctivae normal ENMT: Mucous membranes are moist. Posterior pharynx clear of any exudate or lesions.Normal dentition.  Right ear deformity with earlobe swelling, closed in EAC Neck: normal, supple, no masses, no thyromegaly Respiratory: clear to auscultation bilaterally, no wheezing, no crackles. Normal respiratory effort. No accessory muscle use.  Cardiovascular: Regular rate and rhythm, no murmurs / rubs / gallops.  2+ pitting lower extremity edema with an open wound on right shin. 2+ pedal pulses. No carotid bruits.  Abdomen: no  tenderness, no masses palpated. No hepatosplenomegaly. Bowel sounds positive.  Musculoskeletal: no clubbing / cyanosis. No joint deformity upper and lower extremities. Good ROM, no contractures. Normal muscle tone.  Neurologic: CN 2-12 grossly intact. Sensation intact, DTR normal. Strength 5/5 in all 4.  Psychiatric: Normal judgment and insight. Alert and oriented x 3. Normal mood.  SKIN/catheters: Open lemon size wound along right lower extremity   Labs on Admission: I have personally reviewed following labs and imaging studies  CBC: Recent Labs  Lab 10/21/19 1345  WBC 8.4  NEUTROABS 7.0  HGB 11.1*  HCT 35.8*  MCV 96.0  PLT 756   Basic Metabolic Panel: Recent Labs  Lab 10/21/19 1345  NA 130*  K 4.1  CL 93*  CO2 28  GLUCOSE 947*  BUN 16  CREATININE 0.74  CALCIUM 8.2*   GFR: CrCl cannot be calculated (Unknown ideal weight.). Recent Labs  Lab 10/21/19 1345 10/21/19 1443  WBC 8.4  --   LATICACIDVEN  --  2.7*   Liver Function Tests: Recent Labs  Lab 10/21/19 1345  AST 113*  ALT 143*  ALKPHOS 722*  BILITOT 0.6  PROT 6.2*  ALBUMIN 2.9*   No results for input(s): LIPASE, AMYLASE in the last 168 hours. No results for input(s): AMMONIA in the last 168 hours. Coagulation Profile: No results for input(s): INR, PROTIME in the last 168 hours. Cardiac Enzymes: No results for input(s): CKTOTAL, CKMB, CKMBINDEX, TROPONINI in the last 168 hours. BNP (last 3 results) No results for input(s): PROBNP in the last 8760 hours. HbA1C: No results for input(s): HGBA1C in the last 72 hours. CBG: Recent Labs  Lab 10/21/19 1341 10/21/19 1556 10/21/19 1639 10/21/19 1726  GLUCAP >600* >600* >600* 560*   Lipid Profile: No results for input(s): CHOL, HDL, LDLCALC, TRIG, CHOLHDL, LDLDIRECT in the last 72 hours. Thyroid Function Tests: Recent Labs    10/21/19 1444  TSH 2.097   Anemia Panel: No results for input(s): VITAMINB12, FOLATE, FERRITIN, TIBC, IRON, RETICCTPCT in  the last 72 hours. Urine analysis:    Component Value Date/Time   COLORURINE YELLOW 10/21/2019 1346   APPEARANCEUR CLEAR 10/21/2019 1346   LABSPEC 1.026 10/21/2019 1346   PHURINE 6.0 10/21/2019 1346   GLUCOSEU >=500 (A) 10/21/2019 1346   HGBUR NEGATIVE 10/21/2019 1346   BILIRUBINUR NEGATIVE 10/21/2019 1346   BILIRUBINUR negative 03/15/2018 1049   BILIRUBINUR negative 12/26/2017 0941   KETONESUR NEGATIVE 10/21/2019 1346   PROTEINUR 30 (A) 10/21/2019 1346   UROBILINOGEN 0.2 03/15/2018 1049   UROBILINOGEN 0.2 12/08/2010 1613   NITRITE NEGATIVE 10/21/2019 1346   LEUKOCYTESUR SMALL (A) 10/21/2019 1346    Radiological Exams on Admission: Personally reviewed  CT Head Wo Contrast  Result Date: 10/21/2019 CLINICAL DATA:  Altered mental status. Dizziness. EXAM: CT HEAD WITHOUT CONTRAST TECHNIQUE: Contiguous axial images were obtained from the base of the skull through the vertex without intravenous contrast. COMPARISON:  February 21, 2019. FINDINGS: Brain: Small bilateral convexity CSF-density hygromas that are new since July 2020 and measure up to 7 mm thickness in the axial plane. No acute intracranial hemorrhage, midline shift, herniation, cerebral edema or mass effect. 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 Vascular: Benign dural calcification, likely calcified lipomatosis along the anterior falx cerebri, unchanged. No dense vessel sign. Calcified atherosclerosis of the bilateral carotid siphons. Skull: Diffuse demineralization. A couple benign-appearing calvarial hemangiomas measuring up to 8 mm in the right parietal bone. Stable 7 mm sclerotic lesion in the right posterior parietal bone compared to July 2020. A 4 mm left frontal sinus benign osteoma. No skull fracture. Sinuses/Orbits: Mild bilateral maxillary and left ethmoidal mucosal thickening. Normal bilateral orbital soft tissues. Other: Chronic nasal bone fracture deformities. Asymmetrical thickening and soft  tissue calcification of the right ear, including the helix and ear lobe, not significantly changed in size or contours. Pneumatized bilateral mastoid air cells and middle ears. IMPRESSION: No CT evidence of acute intracranial hemorrhage or edema. 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 Interval development of small bilateral convexity CSF-density hygromas, new since July 2020 and measuring up to 7 mm in thickness. Asymmetrical thickening and soft tissue calcification of the right ear, not significantly changed in size or contours compared to July 2020, and possibly benign or malignant. Dermatology consultation could be considered. Mild bilateral ethmoid and maxillary paranasal sinus mucosal disease. Electronically Signed   By: Revonda Humphrey   On: 10/21/2019 16:59   DG Chest Portable 1 View  Result Date: 10/21/2019 CLINICAL DATA:  Weakness, dizziness and hyperglycemia. EXAM:  PORTABLE CHEST 1 VIEW COMPARISON:  02/19/2019 FINDINGS: The heart size and mediastinal contours are within normal limits. Both lungs are clear. The visualized skeletal structures are unremarkable. IMPRESSION: No active disease. Electronically Signed   By: Nelson Chimes M.D.   On: 10/21/2019 14:52    EKG: Independently reviewed.  Normal sinus rhythm with QTC 466 ms     Assessment and Plan:   Principal Problem:   Diabetic hyperosmolar non-ketotic state ([place]) Active Problems:   Chronic pancreatitis (H[facility])   Severe malnutrition ([place])   Uncontrolled type 2 diabetes mellitus (H[facility])   Abnormal liver function tests   Hyperglycemia   Tobacco use   Non compliance with medical treatment   Hyperosmolar hyperglycemic state with metabolic encephalopathy: POA.  Patient does not have any metabolic acidosis on labs or ketonuria.  DKA ruled out.  Patient had similar presentation last month in the setting of medication noncompliance as well.  He has been started on an insulin drip in the ED, hyperglycemia  protocol.  Can transition insulin drip transitioned to Levemir 8 units BID when blood glucose less than 200. Hemoglobin A1C of >18.5% last month. He will need  PCP and follow-up arranged upon discharge to ensure compliance.  Will consult diabetes educator for further counseling.  Unfortunately, patient has high propensity for recurrent admissions if he continues to be noncompliant with medication regimen.  Mental status now improved and communicating well.  Hypothermia: Likely related to above and should improve with insulin drip.  TSH within normal limits.  No leukocytosis or clinical signs of acute infection.  Continue supportive management with bear hugger, temperature slowly improving.  He received empiric antibiotics in the ED for possible UTI given abnormal UA, hypothermia and elevated lactate.  UA however shows pyuria but no evidence of bacteriuria and nitrite negative.  CT head did report mild ethmoidal/maxillary sinus disease.  Can likely transition to oral Augmentin in a.m.  Leg edema: New leg edema according to patient.  Secondary to acute diastolic CHF (echocardiogram in 05/2019 showed moderate LVH) versus hypoalbuminemia related third spacing in the setting of aggressive IV hydration in recent admission.  Patient will need IV fluids for HHS.  Will add IV Lasix to flush/diurese extra fluid out.  Will be conservative with IV fluid rate.  Check BNP repeat echo.  DC Norvasc which can contribute to venodilation/leg edema  Elevated AST/ALT/Alkaline phosphatase-This appears to be recurrent and progressively worsening.  Unsure of etiology.  Alcohol level less than 10.  No abdominal pain. Alkaline phosphatase was elevated at 555 at the time of last admission and decreased to 421 at the time of discharge.  ALP elevated at 722 today.  Interestingly, total bili appears to be within normal limits.  GGT elevated when checked on 2/3 indicating liver source. RUQ significant for chronically enlarged/dilated CBD  since 2014 with no acute process identified.  Likely cholestasis related changes.  Could be congested liver as well.  Refer to GI for outpatient evaluation at the time of discharge.  Chronic alcoholic pancreatitis: No acute complaints of abdominal pain or nausea or vomiting.  Not on any pancreatic enzymes at home.  Denies ongoing alcohol abuse  Altered mental status with ataxia/dizziness-Possibly related to hyperglycemia. Physical therapy consulted in last admission and patient provided with a walking cane.  Patient advised the importance of diet and insulin compliance to maintain consistent blood glucose levels and to avoid cerebral edema/confusion.  Will consult PT to follow-up.    Hyponatremia: Likely pseudohyponatremia in the setting of  significant hyperglycemia.  Repeat labs in a.m.  Underweight/Severe malnutrition:Body mass index is 14.85 kg/m.Weight has been low chronically but acutely worsened in the last few months. Dietitian consulted last admission and was advised low carb protein supplementation. Need for adequate nutrition discussed with patient.  Essential hypertension: Resume home medication-lisinopril.  DC Norvasc and concern for leg edema.  Will substitute with hydralazine instead.  Right ear abnormality: Reports chronic deformity from recurrent ear infections.  CT findings as above.  Follow-up ENT or dermatology as outpatient.  Right lower extremity wound: Reports recent fall--he does not elaborate on history regarding etiology of this wound.  May have had fall related abrasion.  Appears to have clean borders.  Diuresis as discussed above in consult wound care.  Tobacco use: Counseled regarding risks and advised to consider quitting. Nicotine patch while here  Medication noncompliance: Counseled extensively as discussed above.  Diabetes educator to follow-up.  DVT prophylaxis: Lovenox  COVID screen: Pending  Code Status: DNR per patient   .Health care proxy was his  mother Rodney Barber who apparently passed away.  Now his next of kin/healthcare proxy is brother Rodney Barber  Patient/Family Communication: Discussed with patient and all questions answered to satisfaction.  Consults called: Diabetes educator Admission status :I certify that at the point of admission it is my clinical judgment that the patient will require inpatient hospital care spanning beyond 2 midnights from the point of admission due to high intensity of service and high frequency of surveillance required.Inpatient status is judged to be reasonable and necessary in order to provide the required intensity of service to ensure the patient's safety. The patient's presenting symptoms, physical exam findings, and initial radiographic and laboratory data in the context of their chronic comorbidities is felt to place them at high risk for further clinical deterioration. The following factors support the patient status of inpatient : Nonketotic hyperosmolar hyperglycemia requiring IV insulin drip.     Guilford Shi MD [facility] Pager in [place]  If 7PM-7AM, please contact night-coverage [email or web address]   10/21/2019, 5:43 PM

## 2019-10-21 NOTE — ED Notes (Signed)
Pt transported to CT ?

## 2019-10-21 NOTE — ED Triage Notes (Signed)
BIB [facility] from [facility]. Pt c/o dizziness and EMS was called. Pt reports it's been 2 days since last dose of insulin.  A&O.   20G LAC  750cc NS given by EMS  150/80 70 HR NSR 100% RA

## 2019-10-21 NOTE — ED Provider Notes (Signed)
[place] DEPT Provider Note   CSN: [id number] Arrival date & time: 10/21/19  1331     History Chief Complaint  Patient presents with  . Hyperglycemia    Rodney Barber is a 54 y.o. male.  HPI 54 year old male presents with hyperglycemia and dizziness. Patient is an overall poor historian.  EMS [facility] the homeless [facility] for dizziness.  Has been dizzy whenever he walks for the last couple days.  Has had this before with hyperglycemia.  States he has not taken his insulin for the last couple days because he has been busy and out.  Denies fever.  States he fell and hit his head because of the weakness yesterday and has a mild headache.  No chest pain, shortness of breath.  Patient is telling multiple different stories to both nurse and I as far as the history is concerned.   Past Medical History:  Diagnosis Date  . Diabetes mellitus   . Hypertension   . Pancreatitis     Patient Active Problem List   Diagnosis Date Noted  . Generalized weakness 09/10/2019  . Dehydration 09/10/2019  . Transaminitis 09/10/2019  . Hyperglycemia without ketosis 09/09/2019  . Hyperosmolar hyperglycemic state (HHS) ([place]) 09/09/2019  . UTI (urinary tract infection) 05/14/2019  . Hyperglycemia 05/14/2019  . Tobacco use 05/14/2019  . Abnormal blood electrolyte level 05/14/2019  . Suicide attempt ([place]) 02/26/2019  . Acetaminophen toxicity   . Goals of care, counseling/discussion   . Palliative care by specialist   . Shock liver 02/18/2019  . Hyperosmolar non-ketotic state in patient with type 2 diabetes mellitus ([place]) 02/18/2019  . Alcohol use 02/18/2019  . Substance use disorder 02/18/2019  . AKI (acute kidney injury) ([place])   . Altered behavior   . Altered mental status 12/02/2018  . Abnormal liver function 12/02/2018  . AMS (altered mental status) 12/02/2018  . Cough 12/02/2018  . Uncontrolled type 2 diabetes mellitus ([place]) 03/15/2018  . Urine test  positive for microalbuminuria 07/12/2017  . Type 2 diabetes mellitus with other specified complication ([place]) 21/30/8657  . Liver lesion 07/22/2013  . 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 Diabetes ([place]) 07/18/2013  . Severe malnutrition ([place]) 07/11/2013  . Hypokalemia 07/11/2013  . Protein-calorie malnutrition, severe ([place]) 07/11/2013  . Chronic pancreatitis ([place]) 07/10/2013  . 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 Diabetes mellitus ([place]) 07/10/2013  . Weight loss 07/10/2013  . Diarrhea 07/10/2013  . Liver lesion, right lobe 07/10/2013    Past Surgical History:  Procedure Laterality Date  . ERCP  06/27/2011   Procedure: ENDOSCOPIC RETROGRADE CHOLANGIOPANCREATOGRAPHY (ERCP);  Surgeon: Jeryl Columbia, MD;  Location: [facility];  Service: Endoscopy;  Laterality: N/A;  . ERCP W/ PLASTIC STENT PLACEMENT  03/2011  . IR US GUIDE BX ASP/DRAIN  05/16/2019  . TEE WITHOUT CARDIOVERSION N/A 05/20/2019   Procedure: TRANSESOPHAGEAL ECHOCARDIOGRAM (TEE);  Surgeon: [name], MD;  Location: [facility] ENDOSCOPY;  Service: Cardiovascular;  Laterality: N/A;       Family History  Problem Relation Age of Onset  . Diabetes Mother   . Diabetes Brother     Social History   Tobacco Use  . Smoking status: Current Every Day Smoker    Packs/day: 0.50    Years: 30.00    Pack years: 15.00    Types: Cigarettes  . Smokeless tobacco: Never Used  . Tobacco comment: 6 cigarett /day  Substance Use Topics  . Alcohol use: No  .  Drug use: Not Currently    Types: "Crack" cocaine, Other-see comments, Cocaine    Comment: last smoked crack 12-16-16    Home Medications Prior to Admission medications   Medication Sig Start Date End Date Taking? Authorizing Provider  Blood Glucose Monitoring Suppl (TRUERESULT BLOOD GLUCOSE) w/Device KIT uad 09/11/19  Yes Mariel Aloe, MD  feeding supplement, GLUCERNA SHAKE, (GLUCERNA SHAKE) LIQD Take 237 mLs by mouth 3 (three) times daily between  meals. 09/11/19  Yes Mariel Aloe, MD  folic acid (FOLVITE) 1 MG tablet Take 1 tablet (1 mg total) by mouth daily. 06/25/19  Yes Sheth, Devam P, MD  glucose blood (TRUE METRIX BLOOD GLUCOSE TEST) test strip Use as instructed 09/11/19  Yes Mariel Aloe, MD  insulin detemir (LEVEMIR) 100 UNIT/ML injection Inject 0.08 mLs (8 Units total) into the skin 2 (two) times daily. 09/11/19  Yes Mariel Aloe, MD  Insulin Syringe-Needle U-100 (INSULIN SYRINGE .3CC/31GX5/16") 31G X 5/16" 0.3 ML MISC Draw 8 units of Levemir with syringe twice per day 09/11/19  Yes Mariel Aloe, MD  lactulose (CHRONULAC) 10 GM/15ML solution Take 30 mLs (20 g total) by mouth 2 (two) times daily. 03/06/19  Yes Aline August, MD  nicotine (NICODERM CQ - DOSED IN MG/24 HOURS) 14 mg/24hr patch Place 1 patch (14 mg total) onto the skin daily. 06/26/19  Yes Sheth, Vickii Chafe, MD  TRUEplus Lancets 28G MISC uad 09/11/19  Yes Mariel Aloe, MD  amLODipine (NORVASC) 10 MG tablet Take 10 mg by mouth daily. 09/19/19   [provider]  amLODipine (NORVASC) 5 MG tablet Take 1 tablet (5 mg total) by mouth daily. 09/18/19 10/18/19  Elsie Stain, MD  atorvastatin (LIPITOR) 20 MG tablet Take 1 tablet (20 mg total) by mouth daily at 6 PM. 06/25/19 09/09/19  Sheth, Devam P, MD  escitalopram (LEXAPRO) 10 MG tablet Take 0.5 tablets (5 mg total) by mouth daily. 06/25/19 09/09/19  Sheth, Vickii Chafe, MD  lisinopril (ZESTRIL) 20 MG tablet Take 1 tablet (20 mg total) by mouth daily. 06/26/19 09/09/19  Vicenta Dunning, MD    Allergies    Patient has no known allergies.  Review of Systems   Review of Systems  Constitutional: Negative for fever.  Respiratory: Negative for shortness of breath.   Cardiovascular: Negative for chest pain.  Neurological: Positive for weakness and headaches.  All other systems reviewed and are negative.   Physical Exam Updated Vital Signs BP (!) 175/83   Pulse (!) 57   Temp (!) 93.9 F (34.4 C) (Rectal)   Resp 16    SpO2 100%   Physical Exam Vitals and nursing note reviewed.  Constitutional:      Appearance: He is well-developed.  HENT:     Head: Normocephalic and atraumatic.     Right Ear: External ear normal.     Left Ear: External ear normal.     Nose: Nose normal.  Eyes:     General:        Right eye: No discharge.        Left eye: No discharge.     Extraocular Movements: Extraocular movements intact.     Pupils: Pupils are equal, round, and reactive to light.  Cardiovascular:     Rate and Rhythm: Normal rate and regular rhythm.     Heart sounds: Normal heart sounds.  Pulmonary:     Effort: Pulmonary effort is normal.     Breath sounds: Normal breath sounds.  Abdominal:  Palpations: Abdomen is soft.     Tenderness: There is no abdominal tenderness.  Musculoskeletal:     Cervical back: Neck supple.  Skin:    General: Skin is warm and dry.  Neurological:     Mental Status: He is alert.     Comments: Awake, alert, oriented to person, place. 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 CN 3-12 grossly intact. 5/5 strength in all 4 extremities. Grossly normal sensation.  Psychiatric:        Mood and Affect: Mood is not anxious.     ED Results / Procedures / Treatments   Labs (all labs ordered are listed, but only abnormal results are displayed) Labs Reviewed  COMPREHENSIVE METABOLIC PANEL - Abnormal; Notable for the following components:      Result Value   Sodium 130 (*)    Chloride 93 (*)    Glucose, Bld 947 (*)    Calcium 8.2 (*)    Total Protein 6.2 (*)    Albumin 2.9 (*)    AST 113 (*)    ALT 143 (*)    Alkaline Phosphatase 722 (*)    All other components within normal limits  CBC WITH DIFFERENTIAL/PLATELET - Abnormal; Notable for the following components:   RBC 3.73 (*)    Hemoglobin 11.1 (*)    HCT 35.8 (*)    All other components within normal limits  BLOOD GAS, VENOUS - Abnormal; Notable for the following components:   pCO2, Ven  69.5 (*)    pO2, Ven <31.0 (*)    Bicarbonate 30.6 (*)    Acid-Base Excess 2.4 (*)    All other components within normal limits  URINALYSIS, ROUTINE W REFLEX MICROSCOPIC - Abnormal; Notable for the following components:   Glucose, UA >=500 (*)    Protein, ur 30 (*)    Leukocytes,Ua SMALL (*)    WBC, UA >50 (*)    All other components within normal limits  LACTIC ACID, PLASMA - Abnormal; Notable for the following components:   Lactic Acid, Venous 2.7 (*)    All other components within normal limits  CBG MONITORING, ED - Abnormal; Notable for the following components:   Glucose-Capillary >600 (*)    All other components within normal limits  CBG MONITORING, ED - Abnormal; Notable for the following components:   Glucose-Capillary >600 (*)    All other components within normal limits  CBG MONITORING, ED - Abnormal; Notable for the following components:   Glucose-Capillary >600 (*)    All other components within normal limits  CULTURE, BLOOD (ROUTINE X 2)  CULTURE, BLOOD (ROUTINE X 2)  SARS CORONAVIRUS 2 (TAT 6-24 HRS)  BETA-HYDROXYBUTYRIC ACID  ETHANOL  TSH  LACTIC ACID, PLASMA  T4, FREE  OSMOLALITY  BASIC METABOLIC PANEL  BASIC METABOLIC PANEL  BASIC METABOLIC PANEL    EKG EKG Interpretation  Date/Time:  Tuesday October 21 2019 13:59:36 EDT Ventricular Rate:  62 PR Interval:    QRS Duration: 83 QT Interval:  458 QTC Calculation: 466 R Axis:   76 Text Interpretation: Sinus rhythm Consider left atrial enlargement no significant change since Feb 2021 Confirmed by Sherwood Gambler [phone]) on 10/21/2019 2:19:36 PM   Radiology CT Head Wo Contrast  Result Date: 10/21/2019 CLINICAL DATA:  Altered mental status. Dizziness. EXAM: CT HEAD WITHOUT CONTRAST TECHNIQUE: Contiguous axial images were obtained from the base of the skull through the vertex without intravenous contrast. COMPARISON:  February 21, 2019. FINDINGS: Brain: Small  bilateral convexity CSF-density hygromas that are new  since July 2020 and measure up to 7 mm thickness in the axial plane. No acute intracranial hemorrhage, midline shift, herniation, cerebral edema or mass effect. 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 Vascular: Benign dural calcification, likely calcified lipomatosis along the anterior falx cerebri, unchanged. No dense vessel sign. Calcified atherosclerosis of the bilateral carotid siphons. Skull: Diffuse demineralization. A couple benign-appearing calvarial hemangiomas measuring up to 8 mm in the right parietal bone. Stable 7 mm sclerotic lesion in the right posterior parietal bone compared to July 2020. A 4 mm left frontal sinus benign osteoma. No skull fracture. Sinuses/Orbits: Mild bilateral maxillary and left ethmoidal mucosal thickening. Normal bilateral orbital soft tissues. Other: Chronic nasal bone fracture deformities. Asymmetrical thickening and soft tissue calcification of the right ear, including the helix and ear lobe, not significantly changed in size or contours. Pneumatized bilateral mastoid air cells and middle ears. IMPRESSION: No CT evidence of acute intracranial hemorrhage or edema. 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 Interval development of small bilateral convexity CSF-density hygromas, new since July 2020 and measuring up to 7 mm in thickness. Asymmetrical thickening and soft tissue calcification of the right ear, not significantly changed in size or contours compared to July 2020, and possibly benign or malignant. Dermatology consultation could be considered. Mild bilateral ethmoid and maxillary paranasal sinus mucosal disease. Electronically Signed   By: Revonda Humphrey   On: 10/21/2019 16:59   DG Chest Portable 1 View  Result Date: 10/21/2019 CLINICAL DATA:  Weakness, dizziness and hyperglycemia. EXAM: PORTABLE CHEST 1 VIEW COMPARISON:  02/19/2019 FINDINGS: The heart size and mediastinal contours are within normal limits.  Both lungs are clear. The visualized skeletal structures are unremarkable. IMPRESSION: No active disease. Electronically Signed   By: Nelson Chimes M.D.   On: 10/21/2019 14:52    Procedures .Critical Care Performed by: Sherwood Gambler, MD Authorized by: Sherwood Gambler, MD   Critical care provider statement:    Critical care time (minutes):  35   Critical care time was exclusive of:  Separately billable procedures and treating other patients   Critical care was necessary to treat or prevent imminent or life-threatening deterioration of the following conditions:  Endocrine crisis   Critical care was time spent personally by me on the following activities:  Discussions with consultants, evaluation of patient's response to treatment, examination of patient, ordering and performing treatments and interventions, ordering and review of laboratory studies, ordering and review of radiographic studies, pulse oximetry, re-evaluation of patient's condition, obtaining history from patient or surrogate and review of old charts   (including critical care time)  Medications Ordered in ED Medications  insulin regular, human (MYXREDLIN) 100 units/ 100 mL infusion (9.5 Units/hr Intravenous New Bag/Given 10/21/19 1600)  0.9 %  sodium chloride infusion ( Intravenous New Bag/Given (Non-Interop) 10/21/19 1603)  dextrose 5 %-0.45 % sodium chloride infusion ( Intravenous Hold 10/21/19 1546)  dextrose 50 % solution 0-50 mL (has no administration in time range)  potassium chloride 10 mEq in 100 mL IVPB (10 mEq Intravenous New Bag/Given (Non-Interop) 10/21/19 1602)  lactated ringers infusion (has no administration in time range)  lactated ringers bolus 1,000 mL (0 mLs Intravenous Stopped 10/21/19 1649)  ceFEPIme (MAXIPIME) 2 g in sodium chloride 0.9 % 100 mL IVPB (0 g Intravenous Stopped 10/21/19 1545)  metroNIDAZOLE (FLAGYL) IVPB 500 mg (0 mg Intravenous Stopped 10/21/19 1650)  vancomycin (VANCOCIN) IVPB 1000 mg/200 mL  premix (  0 mg Intravenous Stopped 10/21/19 1650)    ED Course  I have reviewed the triage vital signs and the nursing notes.  Pertinent labs & imaging results that were available during my care of the patient were reviewed by me and considered in my medical decision making (see chart for details).    MDM Rules/Calculators/A&P                      Given the significant hypothermia with altered mental status, was treated with broad-spectrum antibiotics. Is quite hyperglycemia. Likely HHS given AMS. Will get CT head but no focal neuro deficits. Given fluids, insulin. Will need admission.   Rodney Barber was evaluated in Emergency Department on 10/21/2019 for the symptoms described in the history of present illness. He was evaluated in the context of the global COVID-19 pandemic, which necessitated consideration that the patient might be at risk for infection with the SARS-CoV-2 virus that causes COVID-19. Institutional protocols and algorithms that pertain to the evaluation of patients at risk for COVID-19 are in a state of rapid change based on information released by regulatory bodies including the CDC and federal and state organizations. These policies and algorithms were followed during the patient's care in the ED.  Final Clinical Impression(s) / ED Diagnoses Final diagnoses:  Hyperosmolar hyperglycemic state (HHS) ([place])  Hypothermia, initial encounter    Rx / DC Orders ED Discharge Orders    None       Sherwood Gambler, MD 10/21/19 1707

## 2019-10-22 ENCOUNTER — Inpatient Hospital Stay (HOSPITAL_COMMUNITY): Payer: Self-pay

## 2019-10-22 ENCOUNTER — Encounter (HOSPITAL_COMMUNITY): Payer: Self-pay | Admitting: Internal Medicine

## 2019-10-22 DIAGNOSIS — I5031 Acute diastolic (congestive) heart failure: Secondary | ICD-10-CM

## 2019-10-22 LAB — BASIC METABOLIC PANEL
Anion gap: 5 (ref 5–15)
Anion gap: 5 (ref 5–15)
BUN: 10 mg/dL (ref 6–20)
BUN: 11 mg/dL (ref 6–20)
CO2: 25 mmol/L (ref 22–32)
CO2: 28 mmol/L (ref 22–32)
Calcium: 6.9 mg/dL — ABNORMAL LOW (ref 8.9–10.3)
Calcium: 7.7 mg/dL — ABNORMAL LOW (ref 8.9–10.3)
Chloride: 111 mmol/L (ref 98–111)
Chloride: 101 mmol/L (ref 98–111)
Creatinine, Ser: 0.46 mg/dL — ABNORMAL LOW (ref 0.61–1.24)
Creatinine, Ser: 0.5 mg/dL — ABNORMAL LOW (ref 0.61–1.24)
GFR calc Af Amer: >60 mL/min (ref >60)
GFR calc Af Amer: >60 mL/min (ref >60)
GFR calc non Af Amer: >60 mL/min (ref >60)
GFR calc non Af Amer: >60 mL/min (ref >60)
Glucose, Bld: 75 mg/dL (ref 70–99)
Glucose, Bld: 106 mg/dL — ABNORMAL HIGH (ref 70–99)
Potassium: 3 mmol/L — ABNORMAL LOW (ref 3.5–5.1)
Potassium: 3.6 mmol/L (ref 3.5–5.1)
Sodium: 141 mmol/L (ref 135–145)
Sodium: 134 mmol/L — ABNORMAL LOW (ref 135–145)

## 2019-10-22 LAB — GLUCOSE, CAPILLARY
Glucose-Capillary: 202 mg/dL — ABNORMAL HIGH (ref 70–99)
Glucose-Capillary: 34 mg/dL — CL (ref 70–99)
Glucose-Capillary: 161 mg/dL — ABNORMAL HIGH (ref 70–99)

## 2019-10-22 LAB — OSMOLALITY: Osmolality: 324 mOsm/kg (ref 275–295)

## 2019-10-22 LAB — CBG MONITORING, ED
Glucose-Capillary: 139 mg/dL — ABNORMAL HIGH (ref 70–99)
Glucose-Capillary: 172 mg/dL — ABNORMAL HIGH (ref 70–99)
Glucose-Capillary: 217 mg/dL — ABNORMAL HIGH (ref 70–99)

## 2019-10-22 LAB — BRAIN NATRIURETIC PEPTIDE: B Natriuretic Peptide: 195.6 pg/mL — ABNORMAL HIGH (ref 0.0–100.0)

## 2019-10-22 LAB — ECHOCARDIOGRAM COMPLETE

## 2019-10-22 MED ORDER — INSULIN DETEMIR 100 UNIT/ML ~~LOC~~ SOLN
8.0000 [IU] | Freq: Every day | SUBCUTANEOUS | Status: DC
  Administered 2019-10-23 – 2019-10-24 (×2): 8 [IU] via SUBCUTANEOUS
  Filled 2019-10-22 (×2): qty 0.08

## 2019-10-22 NOTE — Progress Notes (Signed)
  Echocardiogram 2D Echocardiogram has been performed.  Rodney Barber [name] 10/22/2019, 11:25 AM

## 2019-10-22 NOTE — Progress Notes (Signed)
Pt's CBG 36 recheck to confirm 34. Pt at this time awake alert oriented x4. Eating dinner. No other acute changes noted . MD was notified and new orders to be placed. 1/2 amp D 50 given IV and recheck Blood Sugar 202.Maitnain current plan of care for Pt and monitor closely

## 2019-10-22 NOTE — Progress Notes (Signed)
PROGRESS NOTE    Rodney Barber  NFA:[id number] DOB: 01-08-1966 DOA: 10/21/2019 PCP: Patient, No Pcp Per  Brief Narrative: Rodney Barber is a 55 y.o. male with history h/o diabetes mellitus 2, hypertension, pancreatitis, tobacco use, homelessness who resides at a shelter home presented with complaints of dizziness that he has experienced over the last couple of days, patient is a very poor historian, in the ED he was noted to have a blood sugar of 947 with a bicarb of 28, he was also noted to be hypothermic on admission and had peripheral edema   Assessment & Plan:  Hyperosmolar hyperglycemic state  metabolic encephalopathy: -Transitioned off insulin drip, continue Lantus -Sliding scale insulin -Patient admits to very poor compliance with insulin tells me that he takes it couple of times a week only  Hypothermia:  -Likely secondary to above  -TSH within normal limits  -UA appears unremarkable, just discontinued ceftriaxone  Leg edema: New leg edema according to patient.  Secondary to acute diastolic CHF (echocardiogram in 05/2019 showed moderate LVH) versus hypoalbuminemia related third spacing in the setting of aggressive IV hydration in recent admission. -Stop IV fluids, follow-up 2D echocardiogram -Continue low-dose IV Lasix today  Elevated AST/ALT/Alkaline phosphatase- -appears to be recurrent and slightly worse from prior  -Etiology is unclear, alcohol level is less than 10  -Medical quadrant ultrasound noted chronically enlarged, dilated CBD, no acute process identified  -Monitor, consider outpatient GI follow-up for this   Chronic alcoholic pancreatitis: No acute complaints of abdominal pain or nausea or vomiting.  Not on any pancreatic enzymes at home.  Denies ongoing alcohol abuse  Hyponatremia: Likely pseudohyponatremia in the setting of significant hyperglycemia -Improved  Underweight/Severe malnutrition:Body mass index is 14.85 kg/m.Weight has been low  chronically but acutely worsened in the last few months. Dietitian consulted last admission and was advised low carb protein supplementation. Need for adequate nutrition discussed with patient.  Essential hypertension: Resume home medication-lisinopril.   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    Right lower extremity wound: Reports recent fall--he does not elaborate on history regarding etiology of this wound.  May have had fall related abrasion.  Appears to have clean borders.  consult wound care.  Tobacco use:  Counseled, nicotine patch  Medication noncompliance: Counseled extensively as discussed above.  Diabetes educator to follow-up.  DVT prophylaxis: Lovenox  Code Status: DNR per patient   .Health care proxy was his mother Rodney Barber who apparently passed away.  Now his next of kin/healthcare proxy is brother Rodney Barber  Patient/Family Communication:  No family at bedside, discussed care with patient Consults called: Diabetes educatorPrincipal Problem:     Procedures:   Antimicrobials:    Subjective: -Came off insulin drip, transition to Lantus sliding scale  Objective: Vitals:   10/22/19 1308 10/22/19 1309 10/22/19 1315 10/22/19 1400  BP:    (!) 149/83  Pulse: (!) 58  (!) 58 60  Resp: 11  12 14   Temp:  97.8 F (36.6 C)  97.7 F (36.5 C)  TempSrc:    Oral  SpO2: 99%  98% 100%  Weight:    49.2 kg  Height:    5\' 8"  (1.727 m)    Intake/Output Summary (Last 24 hours) at 10/22/2019 1458 Last data filed at 10/22/2019 0230 Gross per 24 hour  Intake 2300 ml  Output 1001 ml  Net 1299 ml   Filed Weights   10/22/19 1400  Weight: 49.2 kg    Examination:  General exam: Cachectic thinly built male sitting up in bed, AAOx3  respiratory system: Clear Cardiovascular system: S1 & S2 heard, RRR. Gastrointestinal system: Abdomen is nondistended, soft and nontender.Normal bowel sounds  heard. Central nervous system: Awake alert, oriented to self and place, partly to time, moves all extremities Extremities: 1-2+ edema, extremely dry scaly lower extremities Skin: As above Psychiatry: Poor insight and judgment    Data Reviewed:   CBC: Recent Labs  Lab 10/21/19 1345  WBC 8.4  NEUTROABS 7.0  HGB 11.1*  HCT 35.8*  MCV 96.0  PLT 254   Basic Metabolic Panel: Recent Labs  Lab 10/21/19 1345 10/21/19 1750 10/22/19 0150 10/22/19 0550  NA 130* 138 141 134*  K 4.1 3.6 3.0* 3.6  CL 93* 104 111 101  CO2 28 28 25 28   GLUCOSE 947* 519* 75 106*  BUN 16 14 10 11   CREATININE 0.74 0.69 0.46* 0.50*  CALCIUM 8.2* 8.0* 6.9* 7.7*  MG  --  1.7  --   --    GFR: Estimated Creatinine Clearance: 72.6 mL/min (A) (by C-G formula based on SCr of 0.5 mg/dL (L)). Liver Function Tests: Recent Labs  Lab 10/21/19 1345  AST 113*  ALT 143*  ALKPHOS 722*  BILITOT 0.6  PROT 6.2*  ALBUMIN 2.9*   No results for input(s): LIPASE, AMYLASE in the last 168 hours. No results for input(s): AMMONIA in the last 168 hours. Coagulation Profile: No results for input(s): INR, PROTIME in the last 168 hours. Cardiac Enzymes: No results for input(s): CKTOTAL, CKMB, CKMBINDEX, TROPONINI in the last 168 hours. BNP (last 3 results) No results for input(s): PROBNP in the last 8760 hours. HbA1C: No results for input(s): HGBA1C in the last 72 hours. CBG: Recent Labs  Lab 10/21/19 2216 10/21/19 2236 10/22/19 0832 10/22/19 1147 10/22/19 1257  GLUCAP 69* 109* 139* 172* 217*   Lipid Profile: No results for input(s): CHOL, HDL, LDLCALC, TRIG, CHOLHDL, LDLDIRECT in the last 72 hours. Thyroid Function Tests: Recent Labs    10/21/19 1444  TSH 2.097  FREET4 0.81   Anemia Panel: No results for input(s): VITAMINB12, FOLATE, FERRITIN, TIBC, IRON, RETICCTPCT in the last 72 hours. Urine analysis:    Component Value Date/Time   COLORURINE YELLOW 10/21/2019 1346   APPEARANCEUR CLEAR 10/21/2019  1346   LABSPEC 1.026 10/21/2019 1346   PHURINE 6.0 10/21/2019 1346   GLUCOSEU >=500 (A) 10/21/2019 1346   HGBUR NEGATIVE 10/21/2019 1346   BILIRUBINUR NEGATIVE 10/21/2019 1346   BILIRUBINUR negative 03/15/2018 1049   BILIRUBINUR negative 12/26/2017 0941   KETONESUR NEGATIVE 10/21/2019 1346   PROTEINUR 30 (A) 10/21/2019 1346   UROBILINOGEN 0.2 03/15/2018 1049   UROBILINOGEN 0.2 12/08/2010 1613   NITRITE NEGATIVE 10/21/2019 1346   LEUKOCYTESUR SMALL (A) 10/21/2019 1346   Sepsis Labs: @LABRCNTIP (procalcitonin:4,lacticidven:4)  ) Recent Results (from the past 240 hour(s))  Culture, blood (routine x 2)     Status: None (Preliminary result)   Collection Time: 10/21/19  3:30 PM   Specimen: BLOOD  Result Value Ref Range Status   Specimen Description   Final    BLOOD LEFT ANTECUBITAL Performed at [facility], [address] [place], [place]    Special Requests   Final    BOTTLES DRAWN AEROBIC ONLY Blood Culture adequate volume Performed at [facility], [address], [place], [place]    Culture   Final    NO GROWTH < 24 HOURS Performed at [facility],  [address] [place], [place] [address]    Report Status PENDING  Incomplete  Culture, blood (routine x 2)     Status: None (Preliminary result)   Collection Time: 10/21/19  3:30 PM   Specimen: BLOOD LEFT FOREARM  Result Value Ref Range Status   Specimen Description   Final    BLOOD LEFT FOREARM Performed at [facility], [address], [place], [place] [address]    Special Requests   Final    BOTTLES DRAWN AEROBIC ONLY Blood Culture results may not be optimal due to an inadequate volume of blood received in culture bottles Performed at [facility], [address] [place], [place] [address]    Culture   Final    NO GROWTH < 24 HOURS Performed at [facility], [address], [place], [place] [address]     Report Status PENDING  Incomplete  SARS CORONAVIRUS 2 (TAT 6-24 HRS) Nasopharyngeal Nasopharyngeal Swab     Status: None   Collection Time: 10/21/19  4:06 PM   Specimen: Nasopharyngeal Swab  Result Value Ref Range Status   SARS Coronavirus 2 NEGATIVE NEGATIVE Final    Comment: (NOTE) SARS-CoV-2 target nucleic acids are NOT DETECTED. The SARS-CoV-2 RNA is generally detectable in upper and lower respiratory specimens during the acute phase of infection. Negative results do not preclude SARS-CoV-2 infection, do not rule out co-infections with other pathogens, and should not be used as the sole basis for treatment or other patient management decisions. Negative results must be combined with clinical observations, patient history, and epidemiological information. The expected result is Negative. Fact Sheet for Patients: HairSlick.no Fact Sheet for Healthcare Providers: quierodirigir.com This test is not yet approved or cleared by the [place] FDA and  has been authorized for detection and/or diagnosis of SARS-CoV-2 by FDA under an Emergency Use Authorization (EUA). This EUA will remain  in effect (meaning this test can be used) for the duration of the COVID-19 declaration under Section 56 4(b)(1) of the Act, 21 U.S.C. section 360bbb-3(b)(1), unless the authorization is terminated or revoked sooner. Performed at [facility], [address], [place], [place] [address]          Radiology Studies: CT Head Wo Contrast  Result Date: 10/21/2019 CLINICAL DATA:  Altered mental status. Dizziness. EXAM: CT HEAD WITHOUT CONTRAST TECHNIQUE: Contiguous axial images were obtained from the base of the skull through the vertex without intravenous contrast. COMPARISON:  February 21, 2019. FINDINGS: Brain: Small bilateral convexity CSF-density hygromas that are new since July 2020 and measure up to 7 mm thickness in the axial plane. No acute  intracranial hemorrhage, midline shift, herniation, cerebral edema or mass effect. 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 Vascular: Benign dural calcification, likely calcified lipomatosis along the anterior falx cerebri, unchanged. No dense vessel sign. Calcified atherosclerosis of the bilateral carotid siphons. Skull: Diffuse demineralization. A couple benign-appearing calvarial hemangiomas measuring up to 8 mm in the right parietal bone. Stable 7 mm sclerotic lesion in the right posterior parietal bone compared to July 2020. A 4 mm left frontal sinus benign osteoma. No skull fracture. Sinuses/Orbits: Mild bilateral maxillary and left ethmoidal mucosal thickening. Normal bilateral orbital soft tissues. Other: Chronic nasal bone fracture deformities. Asymmetrical thickening and soft tissue calcification of the right ear, including the helix and ear lobe, not significantly changed in size or contours. Pneumatized bilateral mastoid air cells and middle ears. IMPRESSION: No CT evidence of  acute intracranial hemorrhage or edema. 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 Interval development of small bilateral convexity CSF-density hygromas, new since July 2020 and measuring up to 7 mm in thickness. Asymmetrical thickening and soft tissue calcification of the right ear, not significantly changed in size or contours compared to July 2020, and possibly benign or malignant. Dermatology consultation could be considered. Mild bilateral ethmoid and maxillary paranasal sinus mucosal disease. Electronically Signed   By: Laurence Ferrari   On: 10/21/2019 16:59   DG Chest Portable 1 View  Result Date: 10/21/2019 CLINICAL DATA:  Weakness, dizziness and hyperglycemia. EXAM: PORTABLE CHEST 1 VIEW COMPARISON:  02/19/2019 FINDINGS: The heart size and mediastinal contours are within normal limits. Both lungs are clear. The visualized skeletal structures are unremarkable.  IMPRESSION: No active disease. Electronically Signed   By: Paulina Fusi M.D.   On: 10/21/2019 14:52        Scheduled Meds: . atorvastatin  20 mg Oral q1800  . enoxaparin (LOVENOX) injection  40 mg Subcutaneous Q24H  . escitalopram  5 mg Oral Daily  . feeding supplement (GLUCERNA SHAKE)  237 mL Oral TID BM  . folic acid  1 mg Oral Daily  . furosemide  20 mg Intravenous BID  . insulin aspart  0-5 Units Subcutaneous QHS  . insulin aspart  0-9 Units Subcutaneous TID WC  . insulin detemir  8 Units Subcutaneous BID  . lisinopril  20 mg Oral Daily  . nicotine  14 mg Transdermal Daily   Continuous Infusions: . cefTRIAXone (ROCEPHIN)  IV Stopped (10/22/19 0230)     LOS: 1 day    Time spent:   Zannie Cove, MD [facility] 10/22/2019, 2:58 PM

## 2019-10-22 NOTE — Progress Notes (Signed)
Inpatient Diabetes Program Recommendations  AACE/ADA: New Consensus Statement on Inpatient Glycemic Control (2015)  Target Ranges:  Prepandial:   less than 140 mg/dL      Peak postprandial:   less than 180 mg/dL (1-2 hours)      Critically ill patients:  140 - 180 mg/dL   Lab Results  Component Value Date   GLUCAP 139 (H) 10/22/2019   HGBA1C >18.5 (H) 09/10/2019    Review of Glycemic Control  Diabetes history: DM2 Outpatient Diabetes medications: Levemir 8 units bid Current orders for Inpatient glycemic control: Levemir 8 units bid, Novolog 0-9 units tidwc and hs  HgbA1C - > 18.5% Our Inpt Diabetes Team and spoken with pt multiple times regarding importance of taking insulin as prescribed and f/u with PCP. His homelessness contributes greatly to his inability to control his blood sugars. Previously on metformin and glipizide, d/ced d/t abdominal discomfort. Has not been checking blood sugars as instructed.   Inpatient Diabetes Program Recommendations:     Agree with orders.  If blood sugars below 70 mg/dL, decrease Levemir to 6 units bid.  TOC team involvement. Will follow closely while inpatient.  Thank you. Ailene Ards, RD, LDN, CDE Inpatient Diabetes Coordinator [phone]

## 2019-10-22 NOTE — ED Notes (Signed)
Attempted report to RN. RN will call back within the grace period for report.

## 2019-10-22 NOTE — Progress Notes (Signed)
ED TO INPATIENT HANDOFF REPORT  Name/Age/Gender Rodney Barber 55 y.o. male  Code Status    Code Status Orders  (From admission, onward)         Start     Ordered   10/21/19 1757  Do not attempt resuscitation (DNR)  Continuous    Question Answer Comment  In the event of cardiac or respiratory ARREST Do not call a "code blue"   In the event of cardiac or respiratory ARREST Do not perform Intubation, CPR, defibrillation or ACLS   In the event of cardiac or respiratory ARREST Use medication by any route, position, wound care, and other measures to relive pain and suffering. May use oxygen, suction and manual treatment of airway obstruction as needed for comfort.      10/21/19 1759        Code Status History    Date Active Date Inactive Code Status Order ID Comments User Context   09/09/2019 1911 09/11/2019 2104 DNR [id number]  Angie Fava, DO ED   09/09/2019 1910 09/09/2019 1910 Full Code [id number]  Angie Fava, DO ED   05/13/2019 2300 06/25/2019 1802 DNR [id number]  [name], MD ED   05/13/2019 2227 05/13/2019 2300 Full Code [id number]  [name], MD ED   02/18/2019 2307 03/06/2019 2312 DNR [id number]  Charlsie Quest, MD ED   12/03/2018 0232 12/04/2018 1824 Full Code [id number]  Pearson Grippe, MD Inpatient   07/10/2013 2055 07/14/2013 1751 Full Code [id number]  Zannie Cove, MD Inpatient   Advance Care Planning Activity      Home/SNF/Other Home  Chief Complaint Hyperosmolar hyperglycemic state (HHS) (HCC) [E11.00, E11.65] Hyperglycemia [R73.9]  Level of Care/Admitting Diagnosis ED Disposition    ED Disposition Condition Comment   Admit  Hospital Area: [facility] [[id number]]  Level of Care: Telemetry [5]  Admit to tele based on following criteria: Acute CHF  Covid Evaluation: Asymptomatic Screening Protocol (No Symptoms)  Diagnosis: Hyperglycemia [474259]  Admitting Physician: Zannie Cove [[id number]]  Attending Physician: Zannie Cove  [[id number]]  Estimated length of stay: 3 - 4 days  Certification:: I certify this patient will need inpatient services for at least 2 midnights       Medical History Past Medical History:  Diagnosis Date  . Diabetes mellitus   . Hypertension   . Pancreatitis     Allergies No Known Allergies  IV Location/Drains/Wounds Patient Lines/Drains/Airways Status   Active Line/Drains/Airways    Name:   Placement date:   Placement time:   Site:   Days:   Peripheral IV 10/21/19 Right Forearm   10/21/19    1424    Forearm   1   Peripheral IV 10/21/19 Left Antecubital   10/21/19    1537    Antecubital   1          Labs/Imaging Results for orders placed or performed during the hospital encounter of 10/21/19 (from the past 48 hour(s))  CBG monitoring, ED     Status: Abnormal   Collection Time: 10/21/19  1:41 PM  Result Value Ref Range   Glucose-Capillary >600 (HH) 70 - 99 mg/dL    Comment: Glucose reference range applies only to samples taken after fasting for at least 8 hours.  Comprehensive metabolic panel     Status: Abnormal   Collection Time: 10/21/19  1:45 PM  Result Value Ref Range   Sodium 130 (L) 135 - 145 mmol/L   Potassium 4.1 3.5 - 5.1 mmol/L   Chloride 93 (  L) 98 - 111 mmol/L   CO2 28 22 - 32 mmol/L   Glucose, Bld 947 (HH) 70 - 99 mg/dL    Comment: Glucose reference range applies only to samples taken after fasting for at least 8 hours. CRITICAL RESULT CALLED TO, READ BACK BY AND VERIFIED WITH: Z.TEETERS AT 1526 ON 10/21/19 BY N.THOMPSON    BUN 16 6 - 20 mg/dL   Creatinine, Ser 0.99 0.61 - 1.24 mg/dL   Calcium 8.2 (L) 8.9 - 10.3 mg/dL   Total Protein 6.2 (L) 6.5 - 8.1 g/dL   Albumin 2.9 (L) 3.5 - 5.0 g/dL   AST 833 (H) 15 - 41 U/L   ALT 143 (H) 0 - 44 U/L   Alkaline Phosphatase 722 (H) 38 - 126 U/L   Total Bilirubin 0.6 0.3 - 1.2 mg/dL   GFR calc non Af Amer >60 >60 mL/min   GFR calc Af Amer >60 >60 mL/min   Anion gap 9 5 - 15    Comment: Performed at [facility], [address], [place], [place] [address]  CBC with Differential     Status: Abnormal   Collection Time: 10/21/19  1:45 PM  Result Value Ref Range   WBC 8.4 4.0 - 10.5 K/uL   RBC 3.73 (L) 4.22 - 5.81 MIL/uL   Hemoglobin 11.1 (L) 13.0 - 17.0 g/dL   HCT 39.7 (L) 67.3 - 41.9 %   MCV 96.0 80.0 - 100.0 fL   MCH 29.8 26.0 - 34.0 pg   MCHC 31.0 30.0 - 36.0 g/dL   RDW 37.9 02.4 - 09.7 %   Platelets 254 150 - 400 K/uL   nRBC 0.0 0.0 - 0.2 %   Neutrophils Relative % 84 %   Neutro Abs 7.0 1.7 - 7.7 K/uL   Lymphocytes Relative 13 %   Lymphs Abs 1.1 0.7 - 4.0 K/uL   Monocytes Relative 3 %   Monocytes Absolute 0.3 0.1 - 1.0 K/uL   Eosinophils Relative 0 %   Eosinophils Absolute 0.0 0.0 - 0.5 K/uL   Basophils Relative 0 %   Basophils Absolute 0.0 0.0 - 0.1 K/uL   Immature Granulocytes 0 %   Abs Immature Granulocytes 0.03 0.00 - 0.07 K/uL    Comment: Performed at [facility], [address] [place], [place] [address]  Urinalysis, Routine w reflex microscopic     Status: Abnormal   Collection Time: 10/21/19  1:46 PM  Result Value Ref Range   Color, Urine YELLOW YELLOW   APPearance CLEAR CLEAR   Specific Gravity, Urine 1.026 1.005 - 1.030   pH 6.0 5.0 - 8.0   Glucose, UA >=500 (A) NEGATIVE mg/dL   Hgb urine dipstick NEGATIVE NEGATIVE   Bilirubin Urine NEGATIVE NEGATIVE   Ketones, ur NEGATIVE NEGATIVE mg/dL   Protein, ur 30 (A) NEGATIVE mg/dL   Nitrite NEGATIVE NEGATIVE   Leukocytes,Ua SMALL (A) NEGATIVE   RBC / HPF 0-5 0 - 5 RBC/hpf   WBC, UA >50 (H) 0 - 5 WBC/hpf   Bacteria, UA NONE SEEN NONE SEEN    Comment: Performed at [facility], [address] [place], [place] [address]  Beta-hydroxybutyric acid     Status: None   Collection Time: 10/21/19  1:46 PM  Result Value Ref Range   Beta-Hydroxybutyric Acid 0.19 0.05 - 0.27 mmol/L    Comment: Performed at [facility], [address], [place], [place]  [address]  Ethanol  Status: None   Collection Time: 10/21/19  2:22 PM  Result Value Ref Range   Alcohol, Ethyl (B) <10 <10 mg/dL    Comment: (NOTE) Lowest detectable limit for serum alcohol is 10 mg/dL. For medical purposes only. Performed at [facility], [address], [place], [place] [address]   Blood gas, venous     Status: Abnormal   Collection Time: 10/21/19  2:30 PM  Result Value Ref Range   pH, Ven 7.266 7.250 - 7.430   pCO2, Ven 69.5 (H) 44.0 - 60.0 mmHg   pO2, Ven <31.0 (LL) 32.0 - 45.0 mmHg    Comment: CRITICAL RESULT CALLED TO, READ BACK BY AND VERIFIED WITH: P.DOWD AT 1446 ON 10/21/19 BY N.THOMPSON    Bicarbonate 30.6 (H) 20.0 - 28.0 mmol/L   Acid-Base Excess 2.4 (H) 0.0 - 2.0 mmol/L   O2 Saturation 38.3 %   Patient temperature 98.6     Comment: Performed at [facility], [address], [place], [place] [address]  Brain natriuretic peptide     Status: Abnormal   Collection Time: 10/21/19  2:34 PM  Result Value Ref Range   B Natriuretic Peptide 195.6 (H) 0.0 - 100.0 pg/mL    Comment: Performed at [facility], [address], [place], [place] [address]  Lactic acid, plasma     Status: Abnormal   Collection Time: 10/21/19  2:43 PM  Result Value Ref Range   Lactic Acid, Venous 2.7 (HH) 0.5 - 1.9 mmol/L    Comment: CRITICAL RESULT CALLED TO, READ BACK BY AND VERIFIED WITH: J.LOWDERMILK AT 1642 ON 10/21/19 BY N.THOMPSON Performed at [facility], [address], [place], [place] [address]   TSH     Status: None   Collection Time: 10/21/19  2:44 PM  Result Value Ref Range   TSH 2.097 0.350 - 4.500 uIU/mL    Comment: Performed by a 3rd Generation assay with a functional sensitivity of <=0.01 uIU/mL. Performed at [facility], [address], [place], [place] [address]   T4, free     Status: None   Collection Time: 10/21/19  2:44 PM  Result Value Ref Range   Free T4 0.81  0.61 - 1.12 ng/dL    Comment: (NOTE) Biotin ingestion may interfere with free T4 tests. If the results are inconsistent with the TSH level, previous test results, or the clinical presentation, then consider biotin interference. If needed, order repeat testing after stopping biotin. Performed at [facility], [address] [place], [place] [address]   Culture, blood (routine x 2)     Status: None (Preliminary result)   Collection Time: 10/21/19  3:30 PM   Specimen: BLOOD  Result Value Ref Range   Specimen Description      BLOOD LEFT ANTECUBITAL Performed at [facility], [address], [place], [place] [address]    Special Requests      BOTTLES DRAWN AEROBIC ONLY Blood Culture adequate volume Performed at [facility], [address], [place], [place] [address]    Culture      NO GROWTH < 12 HOURS Performed at [facility], [address] [place], [place] [address]    Report Status PENDING   Culture, blood (routine x 2)     Status: None (Preliminary result)   Collection Time: 10/21/19  3:30 PM   Specimen: BLOOD LEFT FOREARM  Result Value Ref Range   Specimen  Description      BLOOD LEFT FOREARM Performed at [facility], [address] [place], [place] [address]    Special Requests      BOTTLES DRAWN AEROBIC ONLY Blood Culture results may not be optimal due to an inadequate volume of blood received in culture bottles Performed at [facility], [address], [place], [place] [address]    Culture      NO GROWTH < 12 HOURS Performed at [facility], [address], [place], [place] [address]    Report Status PENDING   Osmolality     Status: Abnormal   Collection Time: 10/21/19  3:32 PM  Result Value Ref Range   Osmolality 324 (HH) 275 - 295 mOsm/kg    Comment: CRITICAL RESULT CALLED TO, READ BACK BY AND VERIFIED WITH: TRACEY HANEY AT 0219 ON 10/22/19 RWW Performed at  [facility], [address] [place], [place] [address]   CBG monitoring, ED     Status: Abnormal   Collection Time: 10/21/19  3:56 PM  Result Value Ref Range   Glucose-Capillary >600 (HH) 70 - 99 mg/dL    Comment: Glucose reference range applies only to samples taken after fasting for at least 8 hours.  SARS CORONAVIRUS 2 (TAT 6-24 HRS) Nasopharyngeal Nasopharyngeal Swab     Status: None   Collection Time: 10/21/19  4:06 PM   Specimen: Nasopharyngeal Swab  Result Value Ref Range   SARS Coronavirus 2 NEGATIVE NEGATIVE    Comment: (NOTE) SARS-CoV-2 target nucleic acids are NOT DETECTED. The SARS-CoV-2 RNA is generally detectable in upper and lower respiratory specimens during the acute phase of infection. Negative results do not preclude SARS-CoV-2 infection, do not rule out co-infections with other pathogens, and should not be used as the sole basis for treatment or other patient management decisions. Negative results must be combined with clinical observations, patient history, and epidemiological information. The expected result is Negative. Fact Sheet for Patients: HairSlick.no Fact Sheet for Healthcare Providers: quierodirigir.com This test is not yet approved or cleared by the [place] FDA and  has been authorized for detection and/or diagnosis of SARS-CoV-2 by FDA under an Emergency Use Authorization (EUA). This EUA will remain  in effect (meaning this test can be used) for the duration of the COVID-19 declaration under Section 56 4(b)(1) of the Act, 21 U.S.C. section 360bbb-3(b)(1), unless the authorization is terminated or revoked sooner. Performed at [facility], [address] [place], [place] [address]   CBG monitoring, ED     Status: Abnormal   Collection Time: 10/21/19  4:39 PM  Result Value Ref Range   Glucose-Capillary >600 (HH) 70 - 99 mg/dL    Comment: Glucose reference range applies  only to samples taken after fasting for at least 8 hours.  CBG monitoring, ED     Status: Abnormal   Collection Time: 10/21/19  5:26 PM  Result Value Ref Range   Glucose-Capillary 560 (HH) 70 - 99 mg/dL    Comment: Glucose reference range applies only to samples taken after fasting for at least 8 hours.   Comment 1 Notify RN   Lactic acid, plasma     Status: Abnormal   Collection Time: 10/21/19  5:30 PM  Result Value Ref Range   Lactic Acid, Venous 3.1 (HH) 0.5 - 1.9 mmol/L    Comment: CRITICAL VALUE NOTED.  VALUE IS CONSISTENT WITH PREVIOUSLY REPORTED AND CALLED VALUE. Performed at [facility],  [address], [place], [place] [address]   Basic metabolic panel     Status: Abnormal   Collection Time: 10/21/19  5:50 PM  Result Value Ref Range   Sodium 138 135 - 145 mmol/L    Comment: DELTA CHECK NOTED   Potassium 3.6 3.5 - 5.1 mmol/L   Chloride 104 98 - 111 mmol/L   CO2 28 22 - 32 mmol/L   Glucose, Bld 519 (HH) 70 - 99 mg/dL    Comment: Glucose reference range applies only to samples taken after fasting for at least 8 hours. CRITICAL RESULT CALLED TO, READ BACK BY AND VERIFIED WITH: J.OXENDINE AT 1911 ON 10/21/19 BY N.THOMPSON    BUN 14 6 - 20 mg/dL   Creatinine, Ser 1.91 0.61 - 1.24 mg/dL   Calcium 8.0 (L) 8.9 - 10.3 mg/dL   GFR calc non Af Amer >60 >60 mL/min   GFR calc Af Amer >60 >60 mL/min   Anion gap 6 5 - 15    Comment: Performed at [facility], [address], [place], [place] [address]  Magnesium     Status: None   Collection Time: 10/21/19  5:50 PM  Result Value Ref Range   Magnesium 1.7 1.7 - 2.4 mg/dL    Comment: Performed at [facility], [address] [place], [place] [address]  CBG monitoring, ED     Status: Abnormal   Collection Time: 10/21/19  6:06 PM  Result Value Ref Range   Glucose-Capillary 395 (H) 70 - 99 mg/dL    Comment: Glucose reference range applies only to samples taken after fasting for  at least 8 hours.  CBG monitoring, ED     Status: Abnormal   Collection Time: 10/21/19  7:10 PM  Result Value Ref Range   Glucose-Capillary 277 (H) 70 - 99 mg/dL    Comment: Glucose reference range applies only to samples taken after fasting for at least 8 hours.  CBG monitoring, ED     Status: Abnormal   Collection Time: 10/21/19  8:17 PM  Result Value Ref Range   Glucose-Capillary 185 (H) 70 - 99 mg/dL    Comment: Glucose reference range applies only to samples taken after fasting for at least 8 hours.  CBG monitoring, ED     Status: Abnormal   Collection Time: 10/21/19  9:15 PM  Result Value Ref Range   Glucose-Capillary 103 (H) 70 - 99 mg/dL    Comment: Glucose reference range applies only to samples taken after fasting for at least 8 hours.  CBG monitoring, ED     Status: Abnormal   Collection Time: 10/21/19 10:16 PM  Result Value Ref Range   Glucose-Capillary 69 (L) 70 - 99 mg/dL    Comment: Glucose reference range applies only to samples taken after fasting for at least 8 hours.  CBG monitoring, ED     Status: Abnormal   Collection Time: 10/21/19 10:36 PM  Result Value Ref Range   Glucose-Capillary 109 (H) 70 - 99 mg/dL    Comment: Glucose reference range applies only to samples taken after fasting for at least 8 hours.  Basic metabolic panel     Status: Abnormal   Collection Time: 10/22/19  1:50 AM  Result Value Ref Range   Sodium 141 135 - 145 mmol/L   Potassium 3.0 (L) 3.5 - 5.1 mmol/L   Chloride 111 98 - 111 mmol/L   CO2 25 22 - 32 mmol/L   Glucose, Bld 75 70 -  99 mg/dL    Comment: Glucose reference range applies only to samples taken after fasting for at least 8 hours.   BUN 10 6 - 20 mg/dL   Creatinine, Ser 0.46 (L) 0.61 - 1.24 mg/dL   Calcium 6.9 (L) 8.9 - 10.3 mg/dL   GFR calc non Af Amer >60 >60 mL/min   GFR calc Af Amer >60 >60 mL/min   Anion gap 5 5 - 15    Comment: Performed at [facility], [place] [address], [place], [place] [address]   Basic metabolic panel     Status: Abnormal   Collection Time: 10/22/19  5:50 AM  Result Value Ref Range   Sodium 134 (L) 135 - 145 mmol/L    Comment: DELTA CHECK NOTED   Potassium 3.6 3.5 - 5.1 mmol/L   Chloride 101 98 - 111 mmol/L   CO2 28 22 - 32 mmol/L   Glucose, Bld 106 (H) 70 - 99 mg/dL    Comment: Glucose reference range applies only to samples taken after fasting for at least 8 hours.   BUN 11 6 - 20 mg/dL   Creatinine, Ser 0.50 (L) 0.61 - 1.24 mg/dL   Calcium 7.7 (L) 8.9 - 10.3 mg/dL   GFR calc non Af Amer >60 >60 mL/min   GFR calc Af Amer >60 >60 mL/min   Anion gap 5 5 - 15    Comment: Performed at [facility], [place] [address] [place], [place] [address]  CBG monitoring, ED     Status: Abnormal   Collection Time: 10/22/19  8:32 AM  Result Value Ref Range   Glucose-Capillary 139 (H) 70 - 99 mg/dL    Comment: Glucose reference range applies only to samples taken after fasting for at least 8 hours.  CBG monitoring, ED     Status: Abnormal   Collection Time: 10/22/19 11:47 AM  Result Value Ref Range   Glucose-Capillary 172 (H) 70 - 99 mg/dL    Comment: Glucose reference range applies only to samples taken after fasting for at least 8 hours.  CBG monitoring, ED     Status: Abnormal   Collection Time: 10/22/19 12:57 PM  Result Value Ref Range   Glucose-Capillary 217 (H) 70 - 99 mg/dL    Comment: Glucose reference range applies only to samples taken after fasting for at least 8 hours.   CT Head Wo Contrast  Result Date: 10/21/2019 CLINICAL DATA:  Altered mental status. Dizziness. EXAM: CT HEAD WITHOUT CONTRAST TECHNIQUE: Contiguous axial images were obtained from the base of the skull through the vertex without intravenous contrast. COMPARISON:  February 21, 2019. FINDINGS: Brain: Small bilateral convexity CSF-density hygromas that are new since July 2020 and measure up to 7 mm thickness in the axial plane. No acute intracranial hemorrhage, midline shift,  herniation, cerebral edema or mass effect. 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 Vascular: Benign dural calcification, likely calcified lipomatosis along the anterior falx cerebri, unchanged. No dense vessel sign. Calcified atherosclerosis of the bilateral carotid siphons. Skull: Diffuse demineralization. A couple benign-appearing calvarial hemangiomas measuring up to 8 mm in the right parietal bone. Stable 7 mm sclerotic lesion in the right posterior parietal bone compared to July 2020. A 4 mm left frontal sinus benign osteoma. No skull fracture. Sinuses/Orbits: Mild bilateral maxillary and left ethmoidal mucosal thickening. Normal bilateral orbital soft tissues. Other: Chronic nasal bone fracture deformities. Asymmetrical thickening and soft tissue calcification of the right ear, including the helix  and ear lobe, not significantly changed in size or contours. Pneumatized bilateral mastoid air cells and middle ears. IMPRESSION: No CT evidence of acute intracranial hemorrhage or edema. 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 Interval development of small bilateral convexity CSF-density hygromas, new since July 2020 and measuring up to 7 mm in thickness. Asymmetrical thickening and soft tissue calcification of the right ear, not significantly changed in size or contours compared to July 2020, and possibly benign or malignant. Dermatology consultation could be considered. Mild bilateral ethmoid and maxillary paranasal sinus mucosal disease. Electronically Signed   By: Laurence Ferrari   On: 10/21/2019 16:59   DG Chest Portable 1 View  Result Date: 10/21/2019 CLINICAL DATA:  Weakness, dizziness and hyperglycemia. EXAM: PORTABLE CHEST 1 VIEW COMPARISON:  02/19/2019 FINDINGS: The heart size and mediastinal contours are within normal limits. Both lungs are clear. The visualized skeletal structures are unremarkable. IMPRESSION: No active disease.  Electronically Signed   By: Paulina Fusi M.D.   On: 10/21/2019 14:52    Pending Labs Unresulted Labs (From admission, onward)    Start     Ordered   10/28/19 0500  Creatinine, serum  (enoxaparin (LOVENOX)    CrCl >/= 30 ml/min)  Weekly,   R    Comments: while on enoxaparin therapy    10/21/19 1759   10/21/19 1810  Culture, Urine  Add-on,   AD     10/21/19 1809   10/21/19 1750  Basic metabolic panel  (Hyperglycemic Hyperosmolar State (HHS))  STAT Now then every 4 hours ,   STAT     10/21/19 1541          Vitals/Pain Today's Vitals   10/22/19 1300 10/22/19 1308 10/22/19 1309 10/22/19 1315  BP: (!) 147/82     Pulse:  (!) 58  (!) 58  Resp: 13 11  12   Temp:   97.8 F (36.6 C)   TempSrc:      SpO2:  99%  98%    Isolation Precautions No active isolations  Medications Medications  dextrose 50 % solution 0-50 mL (10 mLs Intravenous Given 10/21/19 2227)  atorvastatin (LIPITOR) tablet 20 mg (20 mg Oral Given 10/21/19 1912)  lisinopril (ZESTRIL) tablet 20 mg (20 mg Oral Given 10/22/19 1315)  escitalopram (LEXAPRO) tablet 5 mg (5 mg Oral Given 10/22/19 1315)  nicotine (NICODERM CQ - dosed in mg/24 hours) patch 14 mg (14 mg Transdermal Patch Applied 10/22/19 1312)  folic acid (FOLVITE) tablet 1 mg (1 mg Oral Given 10/22/19 1314)  feeding supplement (GLUCERNA SHAKE) (GLUCERNA SHAKE) liquid 237 mL (237 mLs Oral Given 10/22/19 1100)  enoxaparin (LOVENOX) injection 40 mg (40 mg Subcutaneous Refused 10/22/19 0158)  potassium chloride 10 mEq in 100 mL IVPB (10 mEq Intravenous Not Given 10/22/19 0720)  cefTRIAXone (ROCEPHIN) 1 g in sodium chloride 0.9 % 100 mL IVPB (0 g Intravenous Stopped 10/22/19 0230)  furosemide (LASIX) injection 20 mg (20 mg Intravenous Given 10/22/19 0903)  insulin aspart (novoLOG) injection 0-9 Units (3 Units Subcutaneous Given 10/22/19 1318)  insulin aspart (novoLOG) injection 0-5 Units (0 Units Subcutaneous Not Given 10/22/19 0057)  insulin detemir (LEVEMIR) injection 8 Units  (8 Units Subcutaneous Given 10/22/19 1148)  lactated ringers bolus 1,000 mL (0 mLs Intravenous Stopped 10/21/19 1649)  ceFEPIme (MAXIPIME) 2 g in sodium chloride 0.9 % 100 mL IVPB (0 g Intravenous Stopped 10/21/19 1545)  metroNIDAZOLE (FLAGYL) IVPB 500 mg (0 mg Intravenous Stopped 10/21/19 1650)  vancomycin (VANCOCIN) IVPB 1000 mg/200  mL premix (0 mg Intravenous Stopped 10/21/19 1650)  potassium chloride 10 mEq in 100 mL IVPB (0 mEq Intravenous Stopped 10/21/19 1827)    Mobility walks

## 2019-10-22 NOTE — Consult Note (Signed)
WOC Nurse Consult Note: Reason for Consult: Consult requested for right leg wound. Wound type: Partial thickness healing wound to right upper leg Measurement: 3X3X.1cm Wound bed: pink and dry Drainage (amount, consistency, odor) no odor or drainage Periwound: intact skin surrounding Dressing procedure/placement/frequency: Topical treatment orders provided for bedside nurses to perform to protect and promote healing as follows: Foam dressing to right leg wound, change Q 3 days or PRN soiling. Please re-consult if further assistance is needed.  Thank-you,  Cammie Mcgee MSN, RN, CWOCN, [place], CNS [phone]

## 2019-10-22 NOTE — Progress Notes (Signed)
Hypoglycemic Event  CBG: 34  Treatment: D50 50 mL (25 gm)  Symptoms: None  Follow-up CBG: Time:1810 CBG Result:202  Possible Reasons for Event: Inadequate meal intake  Comments/MD notified:Dr. Percell Locus, Cathlean Cower

## 2019-10-23 LAB — GLUCOSE, CAPILLARY
Glucose-Capillary: 36 mg/dL — CL (ref 70–99)
Glucose-Capillary: 179 mg/dL — ABNORMAL HIGH (ref 70–99)
Glucose-Capillary: 193 mg/dL — ABNORMAL HIGH (ref 70–99)
Glucose-Capillary: 203 mg/dL — ABNORMAL HIGH (ref 70–99)
Glucose-Capillary: 193 mg/dL — ABNORMAL HIGH (ref 70–99)
Glucose-Capillary: 165 mg/dL — ABNORMAL HIGH (ref 70–99)

## 2019-10-23 LAB — COMPREHENSIVE METABOLIC PANEL
ALT: 89 U/L — ABNORMAL HIGH (ref 0–44)
AST: 105 U/L — ABNORMAL HIGH (ref 15–41)
Albumin: 2.1 g/dL — ABNORMAL LOW (ref 3.5–5.0)
Alkaline Phosphatase: 505 U/L — ABNORMAL HIGH (ref 38–126)
Anion gap: 4 — ABNORMAL LOW (ref 5–15)
BUN: 11 mg/dL (ref 6–20)
CO2: 30 mmol/L (ref 22–32)
Calcium: 7.6 mg/dL — ABNORMAL LOW (ref 8.9–10.3)
Chloride: 102 mmol/L (ref 98–111)
Creatinine, Ser: 0.35 mg/dL — ABNORMAL LOW (ref 0.61–1.24)
GFR calc Af Amer: >60 mL/min (ref >60)
GFR calc non Af Amer: >60 mL/min (ref >60)
Glucose, Bld: 42 mg/dL — CL (ref 70–99)
Potassium: 3.3 mmol/L — ABNORMAL LOW (ref 3.5–5.1)
Sodium: 136 mmol/L (ref 135–145)
Total Bilirubin: 0.4 mg/dL (ref 0.3–1.2)
Total Protein: 4.7 g/dL — ABNORMAL LOW (ref 6.5–8.1)

## 2019-10-23 LAB — CBC
HCT: 28.6 % — ABNORMAL LOW (ref 39.0–52.0)
Hemoglobin: 9.2 g/dL — ABNORMAL LOW (ref 13.0–17.0)
MCH: 30.1 pg (ref 26.0–34.0)
MCHC: 32.2 g/dL (ref 30.0–36.0)
MCV: 93.5 fL (ref 80.0–100.0)
Platelets: 200 10*3/uL (ref 150–400)
RBC: 3.06 MIL/uL — ABNORMAL LOW (ref 4.22–5.81)
RDW: 14.4 % (ref 11.5–15.5)
WBC: 11.1 10*3/uL — ABNORMAL HIGH (ref 4.0–10.5)
nRBC: 0 % (ref 0.0–0.2)

## 2019-10-23 MED ORDER — POTASSIUM CHLORIDE CRYS ER 20 MEQ PO TBCR
40.0000 meq | EXTENDED_RELEASE_TABLET | Freq: Two times a day (BID) | ORAL | Status: AC
  Administered 2019-10-23 (×2): 40 meq via ORAL
  Filled 2019-10-23 (×2): qty 2

## 2019-10-23 NOTE — Progress Notes (Signed)
PROGRESS NOTE    Rodney Barber  ELM:[id number] DOB: 1965/09/18 DOA: 10/21/2019 PCP: Patient, No Pcp Per  Brief Narrative: Rodney Barber is a 54 y.o. male with history h/o diabetes mellitus 2, hypertension, pancreatitis, tobacco use, homelessness who resides at a shelter home presented with complaints of dizziness that he has experienced over the last couple of days, patient is a very poor historian, in the ED he was noted to have a blood sugar of 947 with a bicarb of 28, he was also noted to be hypothermic on admission and had peripheral edema  Assessment & Plan:  Hyperosmolar hyperglycemic state  metabolic encephalopathy: -Transitioned off insulin drip, continue Lantus -Sliding scale insulin -Patient admits to very poor compliance with insulin tells me that he takes it couple of times a week only -CBGs stable, admits not having a glucometer at this shelter, will consult social work/case management -Work on getting a Programmer, systems for his insulin and glucometer  Hypothermia:  -Likely secondary to above  -TSH within normal limits  -UA appears unremarkable, just discontinued ceftriaxone -Resolved  Acute diastolic CHF Hypoalbuminemia -Suspect his longstanding lower extremity edema is likely combination of diastolic dysfunction and chronic hypoalbuminemia with third spacing -Patient denies any known history of liver disease, 2D echocardiogram with preserved EF, diastolic dysfunction noted -Continue IV Lasix today, and swelling is improving -Transition to oral diuretics in 1 to 2 days  Elevated AST/ALT/Alkaline phosphatase- -appears to be recurrent and slightly worse from prior  -Etiology is unclear, alcohol level is less than 10  -Medical quadrant ultrasound noted chronically enlarged, dilated CBD, no acute process identified  -Monitor, recommend outpatient GI follow-up for this   Chronic alcoholic pancreatitis: No acute complaints of abdominal pain or nausea or vomiting.  Not  on any pancreatic enzymes at home.  Denies ongoing alcohol abuse  Hyponatremia: Likely pseudohyponatremia in the setting of significant hyperglycemia -Improved  Underweight/Severe malnutrition:Body mass index is 14.85 kg/m.Weight has been low chronically but acutely worsened in the last few months. Dietitian consulted last admission and was advised low carb protein supplementation. Need for adequate nutrition discussed with patient.  Essential hypertension: Resume home medication-lisinopril.   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    Tobacco use:  Counseled, nicotine patch  Medication noncompliance: Counseled extensively as discussed above.  Diabetes educator to follow-up.  DVT prophylaxis: Lovenox  Code Status: DNR per patient   .Health care proxy was his mother Gertha Calkin who apparently passed away.  Now his next of kin/healthcare proxy is brother Rodney Barber  Patient/Family Communication:  No family at bedside, discussed care with patient  Disposition: Back to the shelter in 1 to 2 days if volume status is improved and CBGs are stable  Consults called: Diabetes educator Principal Problem:     Procedures:   Antimicrobials:    Subjective: -Feels a little better today, swelling is improving -Blood sugars are better as well  Objective: Vitals:   10/23/19 0213 10/23/19 0556 10/23/19 0939 10/23/19 1338  BP: (!) 151/85 (!) 149/82 (!) 148/76 136/78  Pulse: 66 61 70 80  Resp: 16 16    Temp: 98.2 F (36.8 C) 98.2 F (36.8 C) 97.9 F (36.6 C) 98.9 F (37.2 C)  TempSrc: Oral Oral Oral Oral  SpO2: 100%  100% 100%  Weight:      Height:        Intake/Output Summary (Last 24 hours) at 10/23/2019 1422 Last data filed at 10/23/2019 0841 Gross per 24  hour  Intake 240 ml  Output 401 ml  Net -161 ml   Filed Weights   10/22/19 1400  Weight: 49.2 kg    Examination:  Gen: Cachectic thinly  built African-American male sitting in bed, awake alert oriented x3 HEENT: Positive JVD, right earlobe deformity Lungs: Decreased breath sounds both bases CVS: RRR,No Gallops,Rubs or new Murmurs Abd: soft, Non tender, non distended, BS present Extremities: 1+ edema Skin: no new rashes Psychiatry: Poor insight and judgment    Data Reviewed:   CBC: Recent Labs  Lab 10/21/19 1345 10/23/19 0524  WBC 8.4 11.1*  NEUTROABS 7.0  --   HGB 11.1* 9.2*  HCT 35.8* 28.6*  MCV 96.0 93.5  PLT 254 200   Basic Metabolic Panel: Recent Labs  Lab 10/21/19 1345 10/21/19 1750 10/22/19 0150 10/22/19 0550 10/23/19 0524  NA 130* 138 141 134* 136  K 4.1 3.6 3.0* 3.6 3.3*  CL 93* 104 111 101 102  CO2 28 28 25 28 30   GLUCOSE 947* 519* 75 106* 42*  BUN 16 14 10 11 11   CREATININE 0.74 0.69 0.46* 0.50* 0.35*  CALCIUM 8.2* 8.0* 6.9* 7.7* 7.6*  MG  --  1.7  --   --   --    GFR: Estimated Creatinine Clearance: 72.6 mL/min (A) (by C-G formula based on SCr of 0.35 mg/dL (L)). Liver Function Tests: Recent Labs  Lab 10/21/19 1345 10/23/19 0524  AST 113* 105*  ALT 143* 89*  ALKPHOS 722* 505*  BILITOT 0.6 0.4  PROT 6.2* 4.7*  ALBUMIN 2.9* 2.1*   No results for input(s): LIPASE, AMYLASE in the last 168 hours. No results for input(s): AMMONIA in the last 168 hours. Coagulation Profile: No results for input(s): INR, PROTIME in the last 168 hours. Cardiac Enzymes: No results for input(s): CKTOTAL, CKMB, CKMBINDEX, TROPONINI in the last 168 hours. BNP (last 3 results) No results for input(s): PROBNP in the last 8760 hours. HbA1C: No results for input(s): HGBA1C in the last 72 hours. CBG: Recent Labs  Lab 10/22/19 1809 10/22/19 2056 10/23/19 0703 10/23/19 0739 10/23/19 1131  GLUCAP 202* 161* 179* 193* 203*   Lipid Profile: No results for input(s): CHOL, HDL, LDLCALC, TRIG, CHOLHDL, LDLDIRECT in the last 72 hours. Thyroid Function Tests: Recent Labs    10/21/19 1444  TSH 2.097    FREET4 0.81   Anemia Panel: No results for input(s): VITAMINB12, FOLATE, FERRITIN, TIBC, IRON, RETICCTPCT in the last 72 hours. Urine analysis:    Component Value Date/Time   COLORURINE YELLOW 10/21/2019 1346   APPEARANCEUR CLEAR 10/21/2019 1346   LABSPEC 1.026 10/21/2019 1346   PHURINE 6.0 10/21/2019 1346   GLUCOSEU >=500 (A) 10/21/2019 1346   HGBUR NEGATIVE 10/21/2019 1346   BILIRUBINUR NEGATIVE 10/21/2019 1346   BILIRUBINUR negative 03/15/2018 1049   BILIRUBINUR negative 12/26/2017 0941   KETONESUR NEGATIVE 10/21/2019 1346   PROTEINUR 30 (A) 10/21/2019 1346   UROBILINOGEN 0.2 03/15/2018 1049   UROBILINOGEN 0.2 12/08/2010 1613   NITRITE NEGATIVE 10/21/2019 1346   LEUKOCYTESUR SMALL (A) 10/21/2019 1346   Sepsis Labs: @LABRCNTIP (procalcitonin:4,lacticidven:4)  ) Recent Results (from the past 240 hour(s))  Culture, Urine     Status: None (Preliminary result)   Collection Time: 10/21/19  1:46 PM   Specimen: Urine, Random  Result Value Ref Range Status   Specimen Description   Final    URINE, RANDOM Performed at [facility], [address], [place], [place]    Special Requests   Final  NONE Performed at [facility], [address] [place], [place] [address]    Culture   Final    CULTURE REINCUBATED FOR BETTER GROWTH Performed at [facility], [address] [place], [place] [address]    Report Status PENDING  Incomplete  Culture, blood (routine x 2)     Status: None (Preliminary result)   Collection Time: 10/21/19  3:30 PM   Specimen: BLOOD  Result Value Ref Range Status   Specimen Description   Final    BLOOD LEFT ANTECUBITAL Performed at [facility], [address] [place], [place] [address]    Special Requests   Final    BOTTLES DRAWN AEROBIC ONLY Blood Culture adequate volume Performed at [facility], [address], [place], [place] [address]    Culture    Final    NO GROWTH 2 DAYS Performed at [facility], [address] [place], [place] [address]    Report Status PENDING  Incomplete  Culture, blood (routine x 2)     Status: None (Preliminary result)   Collection Time: 10/21/19  3:30 PM   Specimen: BLOOD LEFT FOREARM  Result Value Ref Range Status   Specimen Description   Final    BLOOD LEFT FOREARM Performed at [facility], [address] [place], [place] [address]    Special Requests   Final    BOTTLES DRAWN AEROBIC ONLY Blood Culture results may not be optimal due to an inadequate volume of blood received in culture bottles Performed at [facility], [address] [place], [place] [address]    Culture   Final    NO GROWTH 2 DAYS Performed at [facility], [address] [place], [place] [address]    Report Status PENDING  Incomplete  SARS CORONAVIRUS 2 (TAT 6-24 HRS) Nasopharyngeal Nasopharyngeal Swab     Status: None   Collection Time: 10/21/19  4:06 PM   Specimen: Nasopharyngeal Swab  Result Value Ref Range Status   SARS Coronavirus 2 NEGATIVE NEGATIVE Final    Comment: (NOTE) SARS-CoV-2 target nucleic acids are NOT DETECTED. The SARS-CoV-2 RNA is generally detectable in upper and lower respiratory specimens during the acute phase of infection. Negative results do not preclude SARS-CoV-2 infection, do not rule out co-infections with other pathogens, and should not be used as the sole basis for treatment or other patient management decisions. Negative results must be combined with clinical observations, patient history, and epidemiological information. The expected result is Negative. Fact Sheet for Patients: HairSlick.no Fact Sheet for Healthcare Providers: quierodirigir.com This test is not yet approved or cleared by the [place] FDA and  has been authorized for detection and/or diagnosis of SARS-CoV-2  by FDA under an Emergency Use Authorization (EUA). This EUA will remain  in effect (meaning this test can be used) for the duration of the COVID-19 declaration under Section 56 4(b)(1) of the Act, 21 U.S.C. section 360bbb-3(b)(1), unless the authorization is terminated or revoked sooner. Performed at [facility], [address], [place], [place] [address]          Radiology Studies: CT Head Wo Contrast  Result Date: 10/21/2019 CLINICAL DATA:  Altered mental status. Dizziness. EXAM: CT HEAD WITHOUT CONTRAST TECHNIQUE: Contiguous axial images were obtained from the base of the skull through the vertex without intravenous contrast. COMPARISON:  February 21, 2019. FINDINGS: Brain: Small bilateral convexity CSF-density hygromas that are new since  July 2020 and measure up to 7 mm thickness in the axial plane. No acute intracranial hemorrhage, midline shift, herniation, cerebral edema or mass effect. 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 Vascular: Benign dural calcification, likely calcified lipomatosis along the anterior falx cerebri, unchanged. No dense vessel sign. Calcified atherosclerosis of the bilateral carotid siphons. Skull: Diffuse demineralization. A couple benign-appearing calvarial hemangiomas measuring up to 8 mm in the right parietal bone. Stable 7 mm sclerotic lesion in the right posterior parietal bone compared to July 2020. A 4 mm left frontal sinus benign osteoma. No skull fracture. Sinuses/Orbits: Mild bilateral maxillary and left ethmoidal mucosal thickening. Normal bilateral orbital soft tissues. Other: Chronic nasal bone fracture deformities. Asymmetrical thickening and soft tissue calcification of the right ear, including the helix and ear lobe, not significantly changed in size or contours. Pneumatized bilateral mastoid air cells and middle ears. IMPRESSION: No CT evidence of acute intracranial hemorrhage or edema. Chronic lacunar infarction in  the left internal capsule anterior horn. Interval development of small bilateral convexity CSF-density hygromas, new since July 2020 and measuring up to 7 mm in thickness. Asymmetrical thickening and soft tissue calcification of the right ear, not significantly changed in size or contours compared to July 2020, and possibly benign or malignant. Dermatology consultation could be considered. Mild bilateral ethmoid and maxillary paranasal sinus mucosal disease. Electronically Signed   By: Revonda Humphrey   On: 10/21/2019 16:59   DG Chest Portable 1 View  Result Date: 10/21/2019 CLINICAL DATA:  Weakness, dizziness and hyperglycemia. EXAM: PORTABLE CHEST 1 VIEW COMPARISON:  02/19/2019 FINDINGS: The heart size and mediastinal contours are within normal limits. Both lungs are clear. The visualized skeletal structures are unremarkable. IMPRESSION: No active disease. Electronically Signed   By: Nelson Chimes M.D.   On: 10/21/2019 14:52   ECHOCARDIOGRAM COMPLETE  Result Date: 10/22/2019    ECHOCARDIOGRAM REPORT   Patient Name:   Rodney Barber Date of Exam: 10/22/2019 Medical Rec #:  [id number]        Height:       68.0 in Accession #:    [id number]       Weight:       97.7 lb Date of Birth:  Apr 06, 1966        BSA:          1.507 m Patient Age:    51 years         BP:           142/86 mmHg Patient Gender: M                HR:           67 bpm. Exam Location:  Inpatient Procedure: 2D Echo Indications:    CHF-Acute Diastolic 211.94 / R74.08  History:        Patient has prior history of Echocardiogram examinations, most                 recent 05/20/2019. Risk Factors:Tobacco use and Diabetes. Acute                 kidney injury                 Substance use disorder                 Alcohol use.  Sonographer:    Vikki Ports Turrentine Referring Phys: [id number] [place]  1. Left ventricular ejection fraction, by estimation, is 40 to 45%.  The left ventricle has mildly decreased function. The left ventricle  demonstrates global hypokinesis. Left ventricular diastolic parameters are consistent with Grade I diastolic dysfunction (impaired relaxation).  2. Right ventricular systolic function is normal. The right ventricular size is normal. Tricuspid regurgitation signal is inadequate for assessing PA pressure.  3. The mitral valve is normal in structure. No evidence of mitral valve regurgitation.  4. The aortic valve is tricuspid. Aortic valve regurgitation is trivial. No aortic stenosis is present. There is a small filamentous mobile echodensity on ventricular side of right coronary cusp, unchanged from prior TTE on 05/15/19  5. The inferior vena cava is normal in size with <50% respiratory variability, suggesting right atrial pressure of 8 mmHg. FINDINGS  Left Ventricle: Left ventricular ejection fraction, by estimation, is 40 to 45%. The left ventricle has mildly decreased function. The left ventricle demonstrates global hypokinesis. The left ventricular internal cavity size was normal in size. There is  no left ventricular hypertrophy. Left ventricular diastolic parameters are consistent with Grade I diastolic dysfunction (impaired relaxation). Right Ventricle: The right ventricular size is normal. No increase in right ventricular wall thickness. Right ventricular systolic function is normal. Tricuspid regurgitation signal is inadequate for assessing PA pressure. Left Atrium: Left atrial size was normal in size. Right Atrium: Right atrial size was normal in size. Prominent Chiari network. Pericardium: A small pericardial effusion is present. Mitral Valve: The mitral valve is normal in structure. No evidence of mitral valve regurgitation. Tricuspid Valve: The tricuspid valve is normal in structure. Tricuspid valve regurgitation is trivial. Aortic Valve: The aortic valve is tricuspid. Aortic valve regurgitation is trivial. No aortic stenosis is present. Pulmonic Valve: The pulmonic valve was not well visualized. Pulmonic  valve regurgitation is not visualized. Aorta: The aortic root is normal in size and structure. Venous: The inferior vena cava is normal in size with less than 50% respiratory variability, suggesting right atrial pressure of 8 mmHg. IAS/Shunts: No atrial level shunt detected by color flow Doppler.  LEFT VENTRICLE PLAX 2D LVIDd:         4.50 cm     Diastology LVIDs:         3.90 cm     LV e' lateral:   7.28 cm/s LV PW:         0.80 cm     LV E/e' lateral: 7.2 LV IVS:        0.80 cm     LV e' medial:    5.03 cm/s LVOT diam:     2.00 cm     LV E/e' medial:  10.5 LV SV:         55 LV SV Index:   36 LVOT Area:     3.14 cm  LV Volumes (MOD) LV vol d, MOD A2C: 60.6 ml LV vol d, MOD A4C: 82.8 ml LV vol s, MOD A2C: 34.2 ml LV vol s, MOD A4C: 45.9 ml LV SV MOD A2C:     26.4 ml LV SV MOD A4C:     82.8 ml LV SV MOD BP:      31.9 ml RIGHT VENTRICLE RV S prime:     8.24 cm/s TAPSE (M-mode): 2.0 cm LEFT ATRIUM           Index       RIGHT ATRIUM           Index LA diam:      3.00 cm 1.99 cm/m  RA Area:     10.90 cm  LA Vol (A4C): 22.8 ml 15.13 ml/m RA Volume:   22.60 ml  15.00 ml/m  AORTIC VALVE LVOT Vmax:   73.80 cm/s LVOT Vmean:  55.500 cm/s LVOT VTI:    0.174 m  AORTA Ao Root diam: 3.10 cm MITRAL VALVE MV Area (PHT): 3.03 cm    SHUNTS MV Decel Time: 250 msec    Systemic VTI:  0.17 m MV E velocity: 52.70 cm/s  Systemic Diam: 2.00 cm MV A velocity: 69.80 cm/s MV E/A ratio:  0.76 Epifanio Lesches MD Electronically signed by Epifanio Lesches MD Signature Date/Time: 10/22/2019/3:41:34 PM    Final         Scheduled Meds: . atorvastatin  20 mg Oral q1800  . enoxaparin (LOVENOX) injection  40 mg Subcutaneous Q24H  . escitalopram  5 mg Oral Daily  . feeding supplement (GLUCERNA SHAKE)  237 mL Oral TID BM  . folic acid  1 mg Oral Daily  . furosemide  20 mg Intravenous BID  . insulin aspart  0-5 Units Subcutaneous QHS  . insulin aspart  0-9 Units Subcutaneous TID WC  . insulin detemir  8 Units Subcutaneous Daily    . lisinopril  20 mg Oral Daily  . nicotine  14 mg Transdermal Daily  . potassium chloride  40 mEq Oral BID   Continuous Infusions:    LOS: 2 days    Time spent:   Zannie Cove, MD [facility] 10/23/2019, 2:22 PM

## 2019-10-23 NOTE — TOC Progression Note (Signed)
Transition of Care [facility]) - Progression Note    Patient Details  Name: Rodney Barber MRN: [id number] Date of Birth: November 22, 1965  Transition of Care [facility] CM/SW Contact  Arsh Feutz, Olegario Messier, RN Phone Number: 10/23/2019, 1:43 PM  Clinical Narrative:  Will need [place] transportation @ d/c-he uses a cane.     Expected Discharge Plan: Homeless Shelter Barriers to Discharge: Continued Medical Work up  Expected Discharge Plan and Services Expected Discharge Plan: Homeless Shelter   Discharge Planning Services: CM Consult   Living arrangements for the past 2 months: Homeless Shelter                 DME Arranged: [facility]                     Social Determinants of Health (SDOH) Interventions    Readmission Risk Interventions Readmission Risk Prevention Plan 06/24/2019 02/26/2019 02/22/2019  Transportation Screening Complete Complete Complete  Medication Review Oceanographer) Complete Complete Complete  PCP or Specialist appointment within 3-5 days of discharge Complete Complete Complete  HRI or Home Care Consult Complete Complete Complete  SW Recovery Care/Counseling Consult Complete Complete Complete  Palliative Care Screening Not Applicable Complete Complete  Skilled Nursing Facility Not Applicable - Not Applicable  Some recent data might be hidden

## 2019-10-23 NOTE — Progress Notes (Signed)
fsbs 179

## 2019-10-23 NOTE — TOC Initial Note (Signed)
Transition of Care [facility]) - Initial/Assessment Note    Patient Details  Name: Rodney Barber MRN: [id number] Date of Birth: 05-Oct-1965  Transition of Care [facility]) CM/SW Contact:    Dessa Phi, RN Phone Number: 10/23/2019, 1:14 PM  Clinical Narrative: High risk readmit. From [place] shelter to return @ d/c-spoke to [place] rep-Patient sees Dr. Asencion Noble 1x week @ [facility] when he signs up, she prefers the insulin pen for patient-refrigeration may be an issue. Spoke to patient he wants to continue w/using the vial, & syringes since he is use to it.He says he needs a glucometer-he lost it. He says he has his meds.He just need a glucometer. Will need the [facility] program for a glucometer.His pharmacy is [facility] pharmacy. [place] is following for disability-currently pending for disability.             Expected Discharge Plan: Homeless Shelter Barriers to Discharge: Continued Medical Work up   Patient Goals and CMS Choice Patient states their goals for this hospitalization and ongoing recovery are:: get well      Expected Discharge Plan and Services Expected Discharge Plan: Homeless Shelter   Discharge Planning Services: CM Consult   Living arrangements for the past 2 months: Homeless Shelter                 DME Arranged: Radio producer                    Prior Living Arrangements/Services Living arrangements for the past 2 months: Homeless Shelter Lives with:: Other (Comment)(shelter) Patient language and need for interpreter reviewed:: Yes Do you feel safe going back to the place where you live?: Yes      Need for Family Participation in Patient Care: No (Comment) Care giver support system in place?: Yes (comment)   Criminal Activity/Legal Involvement Pertinent to Current Situation/Hospitalization: No - Comment as needed  Activities of Daily Living Home Assistive Devices/Equipment: None ADL Screening (condition at time of admission) Patient's cognitive  ability adequate to safely complete daily activities?: No Is the patient deaf or have difficulty hearing?: No Does the patient have difficulty seeing, even when wearing glasses/contacts?: No Does the patient have difficulty concentrating, remembering, or making decisions?: No Patient able to express need for assistance with ADLs?: Yes Does the patient have difficulty dressing or bathing?: No Independently performs ADLs?: Yes (appropriate for developmental age) Does the patient have difficulty walking or climbing stairs?: Yes Weakness of Legs: Both Weakness of Arms/Hands: None  Permission Sought/Granted Permission sought to share information with : Case Manager Permission granted to share information with : Yes, Verbal Permission Granted  Share Information with NAME: Case Manager     Permission granted to share info w Relationship: Rodney Barber [phone]     Emotional Assessment Appearance:: Appears stated age Attitude/Demeanor/Rapport: Gracious Affect (typically observed): Accepting Orientation: : Oriented to Self, Oriented to Place, Oriented to  Time, Oriented to Situation Alcohol / Substance Use: Not Applicable Psych Involvement: No (comment)  Admission diagnosis:  Hyperglycemia [R73.9] Hypothermia, initial encounter [T68.XXXA] Hyperosmolar hyperglycemic state (HHS) ([place]) [E11.00, E11.65] Patient Active Problem List   Diagnosis Date Noted  . Non compliance with medical treatment 10/21/2019  . Generalized weakness 09/10/2019  . Dehydration 09/10/2019  . Transaminitis 09/10/2019  . Hyperglycemia without ketosis 09/09/2019  . Diabetic hyperosmolar non-ketotic state ([place]) 09/09/2019  . UTI (urinary tract infection) 05/14/2019  . Hyperglycemia 05/14/2019  . Tobacco use 05/14/2019  . Abnormal blood electrolyte level 05/14/2019  .  Suicide attempt (HCC) 02/26/2019  . Acetaminophen toxicity   . Goals of care, counseling/discussion   . Palliative care by specialist   .  Shock liver 02/18/2019  . Hyperosmolar non-ketotic state in patient with type 2 diabetes mellitus (HCC) 02/18/2019  . Alcohol use 02/18/2019  . Substance use disorder 02/18/2019  . AKI (acute kidney injury) (HCC)   . Altered behavior   . Altered mental status 12/02/2018  . Abnormal liver function tests 12/02/2018  . AMS (altered mental status) 12/02/2018  . Cough 12/02/2018  . Uncontrolled type 2 diabetes mellitus (HCC) 03/15/2018  . Urine test positive for microalbuminuria 07/12/2017  . Type 2 diabetes mellitus with other specified complication (HCC) 09/08/2013  . Liver lesion 07/22/2013  . 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 Diabetes (HCC) 07/18/2013  . Severe malnutrition (HCC) 07/11/2013  . Hypokalemia 07/11/2013  . Protein-calorie malnutrition, severe (HCC) 07/11/2013  . Chronic pancreatitis (HCC) 07/10/2013  . 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 Diabetes mellitus (HCC) 07/10/2013  . Weight loss 07/10/2013  . Diarrhea 07/10/2013  . Liver lesion, right lobe 07/10/2013   PCP:  Patient, No Pcp Per Pharmacy:   [facility] - [place], [place] - [address] [place] [address] Phone: [phone] Fax: [phone]  [facility] [place], [place] - [address] [place] [address] Phone: [phone] Fax: [phone]     Social Determinants of Health (SDOH) Interventions    Readmission Risk Interventions Readmission Risk Prevention Plan 06/24/2019 02/26/2019 02/22/2019  Transportation Screening Complete Complete Complete  Medication Review (RN Care Manager) Complete Complete Complete  PCP or Specialist appointment within 3-5 days of discharge Complete Complete Complete  HRI or Home Care Consult Complete Complete Complete  SW Recovery Care/Counseling Consult Complete Complete Complete  Palliative Care Screening Not Applicable Complete Complete   Skilled Nursing Facility Not Applicable - Not Applicable  Some recent data might be hidden

## 2019-10-23 NOTE — Progress Notes (Signed)
Pt blood sugar is 46 this morning. Pt is A&O x4 and non symptomatic. Hypoglycemic protocol initiated, juice and grahm crackers given to pt. Will recheck blood sugar.

## 2019-10-24 LAB — CBC
HCT: 28.2 % — ABNORMAL LOW (ref 39.0–52.0)
Hemoglobin: 9 g/dL — ABNORMAL LOW (ref 13.0–17.0)
MCH: 30.2 pg (ref 26.0–34.0)
MCHC: 31.9 g/dL (ref 30.0–36.0)
MCV: 94.6 fL (ref 80.0–100.0)
Platelets: 189 10*3/uL (ref 150–400)
RBC: 2.98 MIL/uL — ABNORMAL LOW (ref 4.22–5.81)
RDW: 14.6 % (ref 11.5–15.5)
WBC: 9.2 10*3/uL (ref 4.0–10.5)
nRBC: 0 % (ref 0.0–0.2)

## 2019-10-24 LAB — BASIC METABOLIC PANEL
Anion gap: 5 (ref 5–15)
BUN: 18 mg/dL (ref 6–20)
CO2: 29 mmol/L (ref 22–32)
Calcium: 8.1 mg/dL — ABNORMAL LOW (ref 8.9–10.3)
Chloride: 104 mmol/L (ref 98–111)
Creatinine, Ser: 0.47 mg/dL — ABNORMAL LOW (ref 0.61–1.24)
GFR calc Af Amer: >60 mL/min (ref >60)
GFR calc non Af Amer: >60 mL/min (ref >60)
Glucose, Bld: 195 mg/dL — ABNORMAL HIGH (ref 70–99)
Potassium: 4.5 mmol/L (ref 3.5–5.1)
Sodium: 138 mmol/L (ref 135–145)

## 2019-10-24 LAB — GLUCOSE, CAPILLARY
Glucose-Capillary: 106 mg/dL — ABNORMAL HIGH (ref 70–99)
Glucose-Capillary: 235 mg/dL — ABNORMAL HIGH (ref 70–99)
Glucose-Capillary: 286 mg/dL — ABNORMAL HIGH (ref 70–99)
Glucose-Capillary: 292 mg/dL — ABNORMAL HIGH (ref 70–99)
Glucose-Capillary: 378 mg/dL — ABNORMAL HIGH (ref 70–99)

## 2019-10-24 LAB — URINE CULTURE: Culture: >=100000 — AB

## 2019-10-24 MED ORDER — INSULIN ASPART 100 UNIT/ML ~~LOC~~ SOLN
2.0000 [IU] | Freq: Three times a day (TID) | SUBCUTANEOUS | Status: DC
  Administered 2019-10-24 – 2019-10-26 (×4): 2 [IU] via SUBCUTANEOUS

## 2019-10-24 MED ORDER — LOPERAMIDE HCL 2 MG PO CAPS
2.0000 mg | ORAL_CAPSULE | Freq: Once | ORAL | Status: AC
  Administered 2019-10-24: 2 mg via ORAL
  Filled 2019-10-24: qty 1

## 2019-10-24 MED ORDER — FUROSEMIDE 20 MG PO TABS
20.0000 mg | ORAL_TABLET | Freq: Every day | ORAL | Status: DC
  Administered 2019-10-25 – 2019-10-29 (×5): 20 mg via ORAL
  Filled 2019-10-24 (×5): qty 1

## 2019-10-24 MED ORDER — INSULIN DETEMIR 100 UNIT/ML ~~LOC~~ SOLN
12.0000 [IU] | Freq: Every day | SUBCUTANEOUS | Status: DC
  Administered 2019-10-25 – 2019-10-26 (×2): 12 [IU] via SUBCUTANEOUS
  Filled 2019-10-24 (×2): qty 0.12

## 2019-10-24 NOTE — Progress Notes (Signed)
PROGRESS NOTE    Rodney Barber  [id number]:[id number] DOB: May 25, 1966 DOA: 10/21/2019 PCP: Patient, No Pcp Per  Brief Narrative: Rodney Barber is a 54 y.o. male with history h/o diabetes mellitus 2, hypertension, pancreatitis, tobacco use, homelessness who resides at a shelter home presented with complaints of dizziness that he has experienced over the last couple of days, patient is a very poor historian, in the ED he was noted to have a blood sugar of 947 with a bicarb of 28, he was also noted to be hypothermic on admission and had peripheral edema  Assessment & Plan:  Hyperosmolar hyperglycemic state  Admitted with blood glucose of 947 with metabolic encephalopathy: -Transitioned off insulin drip, continue Lantus -CBGs uncontrolled, increase Lantus and add premeal NovoLog -Patient is homeless, lives at [facility] with limited access to healthcare, case management consulted and following, will need a prescription for glucometer, insulin and supplies at discharge  Hypothermia:  -Likely secondary to above  -TSH within normal limits  -UA appears unremarkable, just discontinued ceftriaxone -Resolved  Acute diastolic CHF Hypoalbuminemia -Suspect his longstanding lower extremity edema is likely combination of diastolic dysfunction and chronic hypoalbuminemia with third spacing -Patient denies any known history of liver disease, 2D echocardiogram with preserved EF, diastolic dysfunction noted -Overall improving, appears close to euvolemic, discontinue IV Lasix today -Start oral diuretics tomorrow  Elevated AST/ALT/Alkaline phosphatase- -appears to be recurrent and slightly worse from prior  -Etiology is unclear, alcohol level is less than 10  -Medical quadrant ultrasound noted chronically enlarged, dilated CBD, no acute process identified  -Monitor, recommend outpatient GI follow-up for this   Chronic alcoholic pancreatitis: No acute complaints of abdominal pain or nausea or vomiting.   Not on any pancreatic enzymes at home.  Denies ongoing alcohol abuse  Hyponatremia: Likely pseudohyponatremia in the setting of significant hyperglycemia -Improved  Underweight/Severe malnutrition:Body mass index is 14.85 kg/m -RD consult, increase protein supplements advised  Essential hypertension: Resume home medication-lisinopril.   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    Tobacco use:  Counseled, nicotine patch  Medication noncompliance: Counseled extensively as discussed above.  Diabetes educator to follow-up.  DVT prophylaxis: Lovenox  Code Status: DNR per patients wishes Patient/Family Communication:  No family at bedside, discussed care with patient Disposition: Back to the shelter in 1 to 2 days if volume status is improved and CBGs are stable  Consults called: Diabetes educator Principal Problem:     Procedures:   Antimicrobials:    Subjective: -Had some diarrhea overnight and this morning, overall feeling better, swelling and breathing is improving -Appetite is better  Objective: Vitals:   10/23/19 1338 10/23/19 2019 10/24/19 0503 10/24/19 1348  BP: 136/78 128/77 (!) 150/78 (!) 152/86  Pulse: 80 74 72 84  Resp:  18 16 17   Temp: 98.9 F (37.2 C) 99.1 F (37.3 C) 98.5 F (36.9 C) 98 F (36.7 C)  TempSrc: Oral Oral Oral Oral  SpO2: 100% 98% 100% 100%  Weight:      Height:        Intake/Output Summary (Last 24 hours) at 10/24/2019 1415 Last data filed at 10/24/2019 1411 Gross per 24 hour  Intake 1200 ml  Output --  Net 1200 ml   Filed Weights   10/22/19 1400  Weight: 49.2 kg    Examination:  Gen: Cachectic thinly built African-American male sitting in bed, AAOx3, no distress HEENT: No JVD, right earlobe deformity noted Lungs: Improved air movement, decreased breath sounds  both bases CVS: S1-S2, regular rate rhythm Abd: soft, Non tender, non distended, BS  present Extremities: Trace edema, scaling and dry skin Skin: no new rashes on exposed skin Psychiatry: Poor insight and judgment    Data Reviewed:   CBC: Recent Labs  Lab 10/21/19 1345 10/23/19 0524 10/24/19 0446  WBC 8.4 11.1* 9.2  NEUTROABS 7.0  --   --   HGB 11.1* 9.2* 9.0*  HCT 35.8* 28.6* 28.2*  MCV 96.0 93.5 94.6  PLT 254 200 607   Basic Metabolic Panel: Recent Labs  Lab 10/21/19 1750 10/22/19 0150 10/22/19 0550 10/23/19 0524 10/24/19 0446  NA 138 141 134* 136 138  K 3.6 3.0* 3.6 3.3* 4.5  CL 104 111 101 102 104  CO2 28 25 28 30 29   GLUCOSE 519* 75 106* 42* 195*  BUN 14 10 11 11 18   CREATININE 0.69 0.46* 0.50* 0.35* 0.47*  CALCIUM 8.0* 6.9* 7.7* 7.6* 8.1*  MG 1.7  --   --   --   --    GFR: Estimated Creatinine Clearance: 72.6 mL/min (A) (by C-G formula based on SCr of 0.47 mg/dL (L)). Liver Function Tests: Recent Labs  Lab 10/21/19 1345 10/23/19 0524  AST 113* 105*  ALT 143* 89*  ALKPHOS 722* 505*  BILITOT 0.6 0.4  PROT 6.2* 4.7*  ALBUMIN 2.9* 2.1*   No results for input(s): LIPASE, AMYLASE in the last 168 hours. No results for input(s): AMMONIA in the last 168 hours. Coagulation Profile: No results for input(s): INR, PROTIME in the last 168 hours. Cardiac Enzymes: No results for input(s): CKTOTAL, CKMB, CKMBINDEX, TROPONINI in the last 168 hours. BNP (last 3 results) No results for input(s): PROBNP in the last 8760 hours. HbA1C: No results for input(s): HGBA1C in the last 72 hours. CBG: Recent Labs  Lab 10/23/19 1606 10/23/19 2033 10/24/19 0045 10/24/19 0707 10/24/19 1052  GLUCAP 193* 165* 235* 286* 378*   Lipid Profile: No results for input(s): CHOL, HDL, LDLCALC, TRIG, CHOLHDL, LDLDIRECT in the last 72 hours. Thyroid Function Tests: Recent Labs    10/21/19 1444  TSH 2.097  FREET4 0.81   Anemia Panel: No results for input(s): VITAMINB12, FOLATE, FERRITIN, TIBC, IRON, RETICCTPCT in the last 72 hours. Urine analysis:     Component Value Date/Time   COLORURINE YELLOW 10/21/2019 1346   APPEARANCEUR CLEAR 10/21/2019 1346   LABSPEC 1.026 10/21/2019 1346   PHURINE 6.0 10/21/2019 1346   GLUCOSEU >=500 (A) 10/21/2019 1346   HGBUR NEGATIVE 10/21/2019 1346   BILIRUBINUR NEGATIVE 10/21/2019 1346   BILIRUBINUR negative 03/15/2018 1049   BILIRUBINUR negative 12/26/2017 0941   KETONESUR NEGATIVE 10/21/2019 1346   PROTEINUR 30 (A) 10/21/2019 1346   UROBILINOGEN 0.2 03/15/2018 1049   UROBILINOGEN 0.2 12/08/2010 1613   NITRITE NEGATIVE 10/21/2019 1346   LEUKOCYTESUR SMALL (A) 10/21/2019 1346   Sepsis Labs: @LABRCNTIP (procalcitonin:4,lacticidven:4)  ) Recent Results (from the past 240 hour(s))  Culture, Urine     Status: Abnormal   Collection Time: 10/21/19  1:46 PM   Specimen: Urine, Random  Result Value Ref Range Status   Specimen Description   Final    URINE, RANDOM Performed at [facility] [address], [place], [place] [address]    Special Requests   Final    NONE Performed at [facility] [place] [address], [place], [place] [address]    Culture >=100,000 COLONIES/mL STAPHYLOCOCCUS AUREUS (A)  Final   Report Status 10/24/2019 FINAL  Final   Organism ID,  Bacteria STAPHYLOCOCCUS AUREUS (A)  Final      Susceptibility   Staphylococcus aureus - MIC*    CIPROFLOXACIN <=0.5 SENSITIVE Sensitive     GENTAMICIN <=0.5 SENSITIVE Sensitive     NITROFURANTOIN <=16 SENSITIVE Sensitive     OXACILLIN <=0.25 SENSITIVE Sensitive     TETRACYCLINE <=1 SENSITIVE Sensitive     VANCOMYCIN <=0.5 SENSITIVE Sensitive     TRIMETH/SULFA <=10 SENSITIVE Sensitive     CLINDAMYCIN <=0.25 SENSITIVE Sensitive     RIFAMPIN <=0.5 SENSITIVE Sensitive     Inducible Clindamycin NEGATIVE Sensitive     * >=100,000 COLONIES/mL STAPHYLOCOCCUS AUREUS  Culture, blood (routine x 2)     Status: None (Preliminary result)   Collection Time: 10/21/19  3:30 PM   Specimen: BLOOD  Result Value Ref Range  Status   Specimen Description   Final    BLOOD LEFT ANTECUBITAL Performed at [facility], [address] [place], [place] [address]    Special Requests   Final    BOTTLES DRAWN AEROBIC ONLY Blood Culture adequate volume Performed at [facility], [address] [place], [place] [address]    Culture   Final    NO GROWTH 3 DAYS Performed at [facility], [address] [place], [place] [address]    Report Status PENDING  Incomplete  Culture, blood (routine x 2)     Status: None (Preliminary result)   Collection Time: 10/21/19  3:30 PM   Specimen: BLOOD LEFT FOREARM  Result Value Ref Range Status   Specimen Description   Final    BLOOD LEFT FOREARM Performed at [facility], [address], [place], [place] [address]    Special Requests   Final    BOTTLES DRAWN AEROBIC ONLY Blood Culture results may not be optimal due to an inadequate volume of blood received in culture bottles Performed at [facility], [address], [place], [place] [address]    Culture   Final    NO GROWTH 3 DAYS Performed at [facility], [address] [place], [place] [address]    Report Status PENDING  Incomplete  SARS CORONAVIRUS 2 (TAT 6-24 HRS) Nasopharyngeal Nasopharyngeal Swab     Status: None   Collection Time: 10/21/19  4:06 PM   Specimen: Nasopharyngeal Swab  Result Value Ref Range Status   SARS Coronavirus 2 NEGATIVE NEGATIVE Final    Comment: (NOTE) SARS-CoV-2 target nucleic acids are NOT DETECTED. The SARS-CoV-2 RNA is generally detectable in upper and lower respiratory specimens during the acute phase of infection. Negative results do not preclude SARS-CoV-2 infection, do not rule out co-infections with other pathogens, and should not be used as the sole basis for treatment or other patient management decisions. Negative results must be combined with clinical observations, patient history, and  epidemiological information. The expected result is Negative. Fact Sheet for Patients: HairSlick.no Fact Sheet for Healthcare Providers: quierodirigir.com This test is not yet approved or cleared by the [place] FDA and  has been authorized for detection and/or diagnosis of SARS-CoV-2 by FDA under an Emergency Use Authorization (EUA). This EUA will remain  in effect (meaning this test can be used) for the duration of the COVID-19 declaration under Section 56 4(b)(1) of the Act, 21 U.S.C. section 360bbb-3(b)(1), unless the authorization is terminated or revoked sooner. Performed at [facility], [address] [place], [place] [address]  Radiology Studies: No results found.      Scheduled Meds: . atorvastatin  20 mg Oral q1800  . enoxaparin (LOVENOX) injection  40 mg Subcutaneous Q24H  . escitalopram  5 mg Oral Daily  . feeding supplement (GLUCERNA SHAKE)  237 mL Oral TID BM  . folic acid  1 mg Oral Daily  . [START ON 10/25/2019] furosemide  20 mg Oral Daily  . insulin aspart  0-5 Units Subcutaneous QHS  . insulin aspart  0-9 Units Subcutaneous TID WC  . insulin aspart  2 Units Subcutaneous TID WC  . [START ON 10/25/2019] insulin detemir  12 Units Subcutaneous Daily  . lisinopril  20 mg Oral Daily  . nicotine  14 mg Transdermal Daily   Continuous Infusions:    LOS: 3 days    Time spent:   Zannie Cove, MD [facility] 10/24/2019, 2:15 PM

## 2019-10-24 NOTE — Progress Notes (Signed)
Inpatient Diabetes Program Recommendations  AACE/ADA: New Consensus Statement on Inpatient Glycemic Control (2015)  Target Ranges:  Prepandial:   less than 140 mg/dL      Peak postprandial:   less than 180 mg/dL (1-2 hours)      Critically ill patients:  140 - 180 mg/dL   Lab Results  Component Value Date   GLUCAP 378 (H) 10/24/2019   HGBA1C >18.5 (H) 09/10/2019    Review of Glycemic Control  Diabetes history: DM2 Outpatient Diabetes medications: Levemir 8 units bid Current orders for Inpatient glycemic control: Levemir 8 units QD, Novolog 0-9 units tidwc and 0-5 units QHS  Inpatient Diabetes Program Recommendations:     Levemir 6 units bid Novolog 2 units tidwc if pt eats > 50% meal/supplement  Continue to follow.  Thank you. Ailene Ards, RD, LDN, CDE Inpatient Diabetes Coordinator [phone]

## 2019-10-25 LAB — GLUCOSE, CAPILLARY
Glucose-Capillary: 169 mg/dL — ABNORMAL HIGH (ref 70–99)
Glucose-Capillary: 234 mg/dL — ABNORMAL HIGH (ref 70–99)
Glucose-Capillary: 92 mg/dL (ref 70–99)
Glucose-Capillary: 224 mg/dL — ABNORMAL HIGH (ref 70–99)

## 2019-10-25 LAB — CBC
HCT: 24.9 % — ABNORMAL LOW (ref 39.0–52.0)
HCT: 28.9 % — ABNORMAL LOW (ref 39.0–52.0)
Hemoglobin: 7.8 g/dL — ABNORMAL LOW (ref 13.0–17.0)
Hemoglobin: 9.2 g/dL — ABNORMAL LOW (ref 13.0–17.0)
MCH: 29.8 pg (ref 26.0–34.0)
MCH: 30.4 pg (ref 26.0–34.0)
MCHC: 31.3 g/dL (ref 30.0–36.0)
MCHC: 31.8 g/dL (ref 30.0–36.0)
MCV: 95 fL (ref 80.0–100.0)
MCV: 95.4 fL (ref 80.0–100.0)
Platelets: 174 10*3/uL (ref 150–400)
Platelets: 200 10*3/uL (ref 150–400)
RBC: 2.62 MIL/uL — ABNORMAL LOW (ref 4.22–5.81)
RBC: 3.03 MIL/uL — ABNORMAL LOW (ref 4.22–5.81)
RDW: 14.6 % (ref 11.5–15.5)
RDW: 14.3 % (ref 11.5–15.5)
WBC: 8 10*3/uL (ref 4.0–10.5)
WBC: 8.1 10*3/uL (ref 4.0–10.5)
nRBC: 0 % (ref 0.0–0.2)
nRBC: 0 % (ref 0.0–0.2)

## 2019-10-25 LAB — BASIC METABOLIC PANEL
Anion gap: 4 — ABNORMAL LOW (ref 5–15)
BUN: 21 mg/dL — ABNORMAL HIGH (ref 6–20)
CO2: 29 mmol/L (ref 22–32)
Calcium: 8 mg/dL — ABNORMAL LOW (ref 8.9–10.3)
Chloride: 103 mmol/L (ref 98–111)
Creatinine, Ser: 0.52 mg/dL — ABNORMAL LOW (ref 0.61–1.24)
GFR calc Af Amer: >60 mL/min (ref >60)
GFR calc non Af Amer: >60 mL/min (ref >60)
Glucose, Bld: 186 mg/dL — ABNORMAL HIGH (ref 70–99)
Potassium: 4.4 mmol/L (ref 3.5–5.1)
Sodium: 136 mmol/L (ref 135–145)

## 2019-10-25 LAB — FOLATE: Folate: 55.9 ng/mL (ref >5.9)

## 2019-10-25 LAB — IRON AND TIBC
Iron: 42 ug/dL — ABNORMAL LOW (ref 45–182)
Saturation Ratios: 23 % (ref 17.9–39.5)
TIBC: 186 ug/dL — ABNORMAL LOW (ref 250–450)
UIBC: 144 ug/dL

## 2019-10-25 LAB — RETICULOCYTES
Immature Retic Fract: 7 % (ref 2.3–15.9)
RBC.: 3.03 MIL/uL — ABNORMAL LOW (ref 4.22–5.81)
Retic Count, Absolute: 56.4 10*3/uL (ref 19.0–186.0)
Retic Ct Pct: 1.9 % (ref 0.4–3.1)

## 2019-10-25 LAB — VITAMIN B12: Vitamin B-12: 853 pg/mL (ref 180–914)

## 2019-10-25 LAB — FERRITIN: Ferritin: 287 ng/mL (ref 24–336)

## 2019-10-25 MED ORDER — LOPERAMIDE HCL 2 MG PO CAPS
2.0000 mg | ORAL_CAPSULE | ORAL | Status: AC | PRN
  Administered 2019-10-25: 2 mg via ORAL
  Filled 2019-10-25: qty 1

## 2019-10-25 NOTE — Progress Notes (Signed)
PROGRESS NOTE    Rodney Barber  QVZ:[id number] DOB: 06/15/1966 DOA: 10/21/2019 PCP: Patient, No Pcp Per  Brief Narrative: Rodney Barber is a 54 y.o.  homeless male with history h/o diabetes mellitus 2, hypertension, pancreatitis, tobacco use, who resides at a shelter  presented with complaints of dizziness that he has experienced over the last couple of days, patient is a very poor historian, in the ED he was noted to have a blood sugar of 947 with a bicarb of 28, he was also noted to be hypothermic on admission and had peripheral edema  Assessment & Plan:  Hyperosmolar hyperglycemic state  Admitted with blood glucose of 947 with metabolic encephalopathy: -Transitioned off insulin drip, continue Lantus -CBGs improving, continue Lantus, premeal NovoLog -Patient is homeless, lives at [facility] with limited access to healthcare, case management consulted and following, will need a prescription for glucometer, insulin and supplies at discharge  Hypothermia:  -Likely secondary to above  -TSH within normal limits  -UA appears unremarkable, just discontinued ceftriaxone -Resolved  Acute diastolic CHF Hypoalbuminemia -Suspect his longstanding lower extremity edema is likely combination of diastolic dysfunction and chronic hypoalbuminemia with third spacing -Patient denies any known history of liver disease, 2D echocardiogram with preserved EF, diastolic dysfunction noted -Overall improving, appears close to euvolemic, -IV Lasix discontinued -Start oral Lasix 20 mg daily  Acute on chronic anemia -Hemoglobin worsening, down to 7.8 from baseline in the 9-10 range -Patient denies overt bleeding, check anemia panel -Monitor hemoglobin trend  Elevated AST/ALT/Alkaline phosphatase- -appears to be recurrent and slightly worse from prior  -Etiology is unclear, alcohol level is less than 10  -Medical quadrant ultrasound noted chronically enlarged, dilated CBD, no acute process identified    -Monitor, recommend outpatient GI follow-up for this   Chronic alcoholic pancreatitis: -Symptoms at this time  Chronic diarrhea -Could be secondary to malabsorption from chronic pancreatitis -Add Creon, Imodium as needed  Hyponatremia:  -Likely pseudohyponatremia in the setting of significant hyperglycemia -Improved  Underweight/Severe malnutrition:Body mass index is 14.85 kg/m -RD consult, increase protein supplements advised  Essential hypertension: Resume home medication-lisinopril.   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    Tobacco use:  Counseled, nicotine patch  Medication noncompliance: Counseled extensively as discussed above.  Diabetes educator to follow-up.  DVT prophylaxis: Lovenox  Code Status: DNR per patients wishes Patient/Family Communication:  No family at bedside, discussed care with patient Disposition: Back to shelter in 1 to 2 days if diarrhea improved, hemoglobin stable  Consults called: Diabetes educator Principal Problem:     Procedures:   Antimicrobials:    Subjective: -Continues to have diarrhea, denies any bleeding, black stools etc. -Breathing better, CBGs more stable  Objective: Vitals:   10/24/19 1348 10/24/19 2216 10/24/19 2230 10/25/19 0519  BP: (!) 152/86 (!) 171/83 138/87 (!) 164/78  Pulse: 84 73 70 65  Resp: 17 18  18   Temp: 98 F (36.7 C) 98.5 F (36.9 C)  97.9 F (36.6 C)  TempSrc: Oral Oral  Oral  SpO2: 100% 100%  100%  Weight:      Height:        Intake/Output Summary (Last 24 hours) at 10/25/2019 1226 Last data filed at 10/25/2019 0701 Gross per 24 hour  Intake 1200 ml  Output --  Net 1200 ml   Filed Weights   10/22/19 1400  Weight: 49.2 kg    Examination:  Gen: Cachectic thinly built African-American male, sitting in bed, AAOx3 no  distress HEENT: No JVD, right earlobe deformity noted Lungs: Improved air movement, clear  today  CVS: RRR,No Gallops,Rubs or new Murmurs Abd: Soft, nontender, nondistended, bowel sounds present Extremities: Trace edema, severe scaling and dry skin Skin: no new rashes on exposed skin Psychiatry: Poor insight and judgment    Data Reviewed:   CBC: Recent Labs  Lab 10/21/19 1345 10/23/19 0524 10/24/19 0446 10/25/19 0427  WBC 8.4 11.1* 9.2 8.0  NEUTROABS 7.0  --   --   --   HGB 11.1* 9.2* 9.0* 7.8*  HCT 35.8* 28.6* 28.2* 24.9*  MCV 96.0 93.5 94.6 95.0  PLT 254 200 189 016   Basic Metabolic Panel: Recent Labs  Lab 10/21/19 1750 10/21/19 1750 10/22/19 0150 10/22/19 0550 10/23/19 0524 10/24/19 0446 10/25/19 0427  NA 138   < > 141 134* 136 138 136  K 3.6   < > 3.0* 3.6 3.3* 4.5 4.4  CL 104   < > 111 101 102 104 103  CO2 28   < > 25 28 30 29 29   GLUCOSE 519*   < > 75 106* 42* 195* 186*  BUN 14   < > 10 11 11 18  21*  CREATININE 0.69   < > 0.46* 0.50* 0.35* 0.47* 0.52*  CALCIUM 8.0*   < > 6.9* 7.7* 7.6* 8.1* 8.0*  MG 1.7  --   --   --   --   --   --    < > = values in this interval not displayed.   GFR: Estimated Creatinine Clearance: 72.6 mL/min (A) (by C-G formula based on SCr of 0.52 mg/dL (L)). Liver Function Tests: Recent Labs  Lab 10/21/19 1345 10/23/19 0524  AST 113* 105*  ALT 143* 89*  ALKPHOS 722* 505*  BILITOT 0.6 0.4  PROT 6.2* 4.7*  ALBUMIN 2.9* 2.1*   No results for input(s): LIPASE, AMYLASE in the last 168 hours. No results for input(s): AMMONIA in the last 168 hours. Coagulation Profile: No results for input(s): INR, PROTIME in the last 168 hours. Cardiac Enzymes: No results for input(s): CKTOTAL, CKMB, CKMBINDEX, TROPONINI in the last 168 hours. BNP (last 3 results) No results for input(s): PROBNP in the last 8760 hours. HbA1C: No results for input(s): HGBA1C in the last 72 hours. CBG: Recent Labs  Lab 10/24/19 1052 10/24/19 1631 10/24/19 2118 10/25/19 0755 10/25/19 1205  GLUCAP 378* 106* 292* 169* 234*   Lipid  Profile: No results for input(s): CHOL, HDL, LDLCALC, TRIG, CHOLHDL, LDLDIRECT in the last 72 hours. Thyroid Function Tests: No results for input(s): TSH, T4TOTAL, FREET4, T3FREE, THYROIDAB in the last 72 hours. Anemia Panel: No results for input(s): VITAMINB12, FOLATE, FERRITIN, TIBC, IRON, RETICCTPCT in the last 72 hours. Urine analysis:    Component Value Date/Time   COLORURINE YELLOW 10/21/2019 1346   APPEARANCEUR CLEAR 10/21/2019 1346   LABSPEC 1.026 10/21/2019 1346   PHURINE 6.0 10/21/2019 1346   GLUCOSEU >=500 (A) 10/21/2019 1346   HGBUR NEGATIVE 10/21/2019 1346   BILIRUBINUR NEGATIVE 10/21/2019 1346   BILIRUBINUR negative 03/15/2018 1049   BILIRUBINUR negative 12/26/2017 0941   KETONESUR NEGATIVE 10/21/2019 1346   PROTEINUR 30 (A) 10/21/2019 1346   UROBILINOGEN 0.2 03/15/2018 1049   UROBILINOGEN 0.2 12/08/2010 1613   NITRITE NEGATIVE 10/21/2019 1346   LEUKOCYTESUR SMALL (A) 10/21/2019 1346   Sepsis Labs: @LABRCNTIP (procalcitonin:4,lacticidven:4)  ) Recent Results (from the past 240 hour(s))  Culture, Urine     Status: Abnormal   Collection Time: 10/21/19  1:46 PM  Specimen: Urine, Random  Result Value Ref Range Status   Specimen Description   Final    URINE, RANDOM Performed at [facility], [address] [place], [place] [address]    Special Requests   Final    NONE Performed at [facility], [address], [place], [place] [address]    Culture >=100,000 COLONIES/mL STAPHYLOCOCCUS AUREUS (A)  Final   Report Status 10/24/2019 FINAL  Final   Organism ID, Bacteria STAPHYLOCOCCUS AUREUS (A)  Final      Susceptibility   Staphylococcus aureus - MIC*    CIPROFLOXACIN <=0.5 SENSITIVE Sensitive     GENTAMICIN <=0.5 SENSITIVE Sensitive     NITROFURANTOIN <=16 SENSITIVE Sensitive     OXACILLIN <=0.25 SENSITIVE Sensitive     TETRACYCLINE <=1 SENSITIVE Sensitive     VANCOMYCIN <=0.5 SENSITIVE Sensitive     TRIMETH/SULFA <=10  SENSITIVE Sensitive     CLINDAMYCIN <=0.25 SENSITIVE Sensitive     RIFAMPIN <=0.5 SENSITIVE Sensitive     Inducible Clindamycin NEGATIVE Sensitive     * >=100,000 COLONIES/mL STAPHYLOCOCCUS AUREUS  Culture, blood (routine x 2)     Status: None (Preliminary result)   Collection Time: 10/21/19  3:30 PM   Specimen: BLOOD  Result Value Ref Range Status   Specimen Description   Final    BLOOD LEFT ANTECUBITAL Performed at [facility], [address] [place], [place] [address]    Special Requests   Final    BOTTLES DRAWN AEROBIC ONLY Blood Culture adequate volume Performed at [facility], [address], [place], [place] [address]    Culture   Final    NO GROWTH 4 DAYS Performed at [facility], [address], [place], [place] [address]    Report Status PENDING  Incomplete  Culture, blood (routine x 2)     Status: None (Preliminary result)   Collection Time: 10/21/19  3:30 PM   Specimen: BLOOD LEFT FOREARM  Result Value Ref Range Status   Specimen Description   Final    BLOOD LEFT FOREARM Performed at [facility], [address], [place], [place] [address]    Special Requests   Final    BOTTLES DRAWN AEROBIC ONLY Blood Culture results may not be optimal due to an inadequate volume of blood received in culture bottles Performed at [facility], [address] [place], [place] [address]    Culture   Final    NO GROWTH 4 DAYS Performed at [facility], [address] [place], [place] [address]    Report Status PENDING  Incomplete  SARS CORONAVIRUS 2 (TAT 6-24 HRS) Nasopharyngeal Nasopharyngeal Swab     Status: None   Collection Time: 10/21/19  4:06 PM   Specimen: Nasopharyngeal Swab  Result Value Ref Range Status   SARS Coronavirus 2 NEGATIVE NEGATIVE Final    Comment: (NOTE) SARS-CoV-2 target nucleic acids are NOT DETECTED. The SARS-CoV-2 RNA is generally detectable in upper and  lower respiratory specimens during the acute phase of infection. Negative results do not preclude SARS-CoV-2 infection, do not rule out co-infections with other pathogens, and should not be used as the sole basis for treatment or other patient management decisions. Negative results must be combined with clinical observations, patient history, and epidemiological information. The expected result is Negative. Fact Sheet for Patients: HairSlick.no Fact Sheet for Healthcare Providers: quierodirigir.com This test is not yet approved or cleared by the [place]  [place] FDA and  has been authorized for detection and/or diagnosis of SARS-CoV-2 by FDA under an Emergency Use Authorization (EUA). This EUA will remain  in effect (meaning this test can be used) for the duration of the COVID-19 declaration under Section 56 4(b)(1) of the Act, 21 U.S.C. section 360bbb-3(b)(1), unless the authorization is terminated or revoked sooner. Performed at [facility], [address], [place], [place] [address]          Radiology Studies: No results found.      Scheduled Meds:  atorvastatin  20 mg Oral q1800   enoxaparin (LOVENOX) injection  40 mg Subcutaneous Q24H   escitalopram  5 mg Oral Daily   feeding supplement (GLUCERNA SHAKE)  237 mL Oral TID BM   folic acid  1 mg Oral Daily   furosemide  20 mg Oral Daily   insulin aspart  0-5 Units Subcutaneous QHS   insulin aspart  0-9 Units Subcutaneous TID WC   insulin aspart  2 Units Subcutaneous TID WC   insulin detemir  12 Units Subcutaneous Daily   lisinopril  20 mg Oral Daily   nicotine  14 mg Transdermal Daily   Continuous Infusions:    LOS: 4 days    Time spent:   Zannie Cove, MD [facility] 10/25/2019, 12:26 PM

## 2019-10-26 LAB — CBC
HCT: 24.6 % — ABNORMAL LOW (ref 39.0–52.0)
HCT: 29.8 % — ABNORMAL LOW (ref 39.0–52.0)
Hemoglobin: 7.6 g/dL — ABNORMAL LOW (ref 13.0–17.0)
Hemoglobin: 8.5 g/dL — ABNORMAL LOW (ref 13.0–17.0)
MCH: 29.5 pg (ref 26.0–34.0)
MCH: 30.1 pg (ref 26.0–34.0)
MCHC: 30.9 g/dL (ref 30.0–36.0)
MCHC: 28.5 g/dL — ABNORMAL LOW (ref 30.0–36.0)
MCV: 95.3 fL (ref 80.0–100.0)
MCV: 105.7 fL — ABNORMAL HIGH (ref 80.0–100.0)
Platelets: 182 10*3/uL (ref 150–400)
Platelets: 169 10*3/uL (ref 150–400)
RBC: 2.58 MIL/uL — ABNORMAL LOW (ref 4.22–5.81)
RBC: 2.82 MIL/uL — ABNORMAL LOW (ref 4.22–5.81)
RDW: 14.5 % (ref 11.5–15.5)
RDW: 14.6 % (ref 11.5–15.5)
WBC: 7.8 10*3/uL (ref 4.0–10.5)
WBC: 7.6 10*3/uL (ref 4.0–10.5)
nRBC: 0 % (ref 0.0–0.2)
nRBC: 0 % (ref 0.0–0.2)

## 2019-10-26 LAB — GLUCOSE, CAPILLARY
Glucose-Capillary: 323 mg/dL — ABNORMAL HIGH (ref 70–99)
Glucose-Capillary: 357 mg/dL — ABNORMAL HIGH (ref 70–99)
Glucose-Capillary: 166 mg/dL — ABNORMAL HIGH (ref 70–99)
Glucose-Capillary: 158 mg/dL — ABNORMAL HIGH (ref 70–99)

## 2019-10-26 LAB — BASIC METABOLIC PANEL
Anion gap: 6 (ref 5–15)
BUN: 21 mg/dL — ABNORMAL HIGH (ref 6–20)
CO2: 29 mmol/L (ref 22–32)
Calcium: 7.9 mg/dL — ABNORMAL LOW (ref 8.9–10.3)
Chloride: 101 mmol/L (ref 98–111)
Creatinine, Ser: 0.53 mg/dL — ABNORMAL LOW (ref 0.61–1.24)
GFR calc Af Amer: >60 mL/min (ref >60)
GFR calc non Af Amer: >60 mL/min (ref >60)
Glucose, Bld: 295 mg/dL — ABNORMAL HIGH (ref 70–99)
Potassium: 4.2 mmol/L (ref 3.5–5.1)
Sodium: 136 mmol/L (ref 135–145)

## 2019-10-26 LAB — CULTURE, BLOOD (ROUTINE X 2)
Culture: NO GROWTH
Culture: NO GROWTH
Special Requests: ADEQUATE

## 2019-10-26 MED ORDER — INSULIN DETEMIR 100 UNIT/ML ~~LOC~~ SOLN
15.0000 [IU] | Freq: Every day | SUBCUTANEOUS | Status: DC
  Administered 2019-10-27: 15 [IU] via SUBCUTANEOUS
  Filled 2019-10-26: qty 0.15

## 2019-10-26 MED ORDER — PANCRELIPASE (LIP-PROT-AMYL) 12000-38000 UNITS PO CPEP
12000.0000 [IU] | ORAL_CAPSULE | Freq: Three times a day (TID) | ORAL | Status: DC
  Administered 2019-10-26 – 2019-10-29 (×9): 12000 [IU] via ORAL
  Filled 2019-10-26 (×9): qty 1

## 2019-10-26 MED ORDER — INSULIN ASPART 100 UNIT/ML ~~LOC~~ SOLN
4.0000 [IU] | Freq: Three times a day (TID) | SUBCUTANEOUS | Status: DC
  Administered 2019-10-26 – 2019-10-27 (×4): 4 [IU] via SUBCUTANEOUS

## 2019-10-26 NOTE — Progress Notes (Signed)
PROGRESS NOTE    Rodney Barber  BEE:[id number] DOB: [birth year] DOA: 10/21/2019 PCP: Patient, No Pcp Per  Brief Narrative: Rodney Barber is a 54 y.o.  homeless male with history h/o diabetes mellitus 2, hypertension, pancreatitis, tobacco use, who resides at a shelter  presented with complaints of dizziness that he has experienced over the last couple of days, patient is a very poor historian, in the ED he was noted to have a blood sugar of 947 with a bicarb of 28, he was also noted to be hypothermic on admission and had peripheral edema. -Admitted with hyperosmolar nonketotic hyperglycemia, -Hospitalization complicated by ongoing diarrhea and anemia  Assessment & Plan:  Hyperosmolar hyperglycemic state  Admitted with blood glucose of 947 with metabolic encephalopathy: -Transitioned off insulin drip, continue Lantus -CBGs CBGs running high again, per staff patient is eating double portions with each meal -Increase Lantus and premeal NovoLog -Patient is homeless, lives at [facility] with limited access to healthcare, case management consulted and following, will need a prescription for glucometer, insulin and supplies at discharge  Hypothermia:  -Likely secondary to above  -TSH within normal limits  -UA appears unremarkable, just discontinued ceftriaxone -Resolved  Acute diastolic CHF Hypoalbuminemia -Suspect his longstanding lower extremity edema is likely combination of diastolic dysfunction and chronic hypoalbuminemia with third spacing -Patient denies any known history of liver disease, 2D echocardiogram with preserved EF, diastolic dysfunction noted -Overall improving, appears close to euvolemic, -IV Lasix discontinued -Transition to oral Lasix, continue this daily  Acute on chronic anemia -Baseline hemoglobin around 9-10 range, hemoglobin dropped to 7.8 yesterday, repeat in the afternoon was 9.2 surprisingly, followed by 7.6 this morning  -No overt bleeding Hemoccult on  exam is negative  -Repeat hemoglobin this afternoon -Anemia panel suggestive of chronic disease -If hemoglobin remains stable, discharge back to shelter, otherwise will need inpatient work-up  Elevated AST/ALT/Alkaline phosphatase- -appears to be recurrent and slightly worse from prior  -Etiology is unclear, alcohol level is less than 10  -Medical quadrant ultrasound noted chronically enlarged, dilated CBD, no acute process identified  -Monitor, recommend outpatient GI follow-up for this   Chronic alcoholic pancreatitis: -Symptoms at this time  Chronic diarrhea -Could be secondary to malabsorption from chronic pancreatitis -Add Creon, Imodium as needed  Hyponatremia:  -Likely pseudohyponatremia in the setting of significant hyperglycemia -Improved  Underweight/Severe malnutrition:Body mass index is 14.85 kg/m -RD consult, increase protein supplements advised  Essential hypertension: Resume home medication-lisinopril.   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    Tobacco use:  Counseled, nicotine patch  Medication noncompliance: Counseled extensively as discussed above.  Diabetes educator to follow-up.  DVT prophylaxis: Lovenox  Code Status: DNR per patients wishes Patient/Family Communication:  No family at bedside, discussed care with patient Disposition: Back to shelter tomorrow if diarrhea improved, hemoglobin stable  Consults called: Diabetes educator Principal Problem:     Procedures:   Antimicrobials:    Subjective: -Feels okay overall, continues to have diarrhea, denies any melena or hematochezia -Per staff he is hungry all the time  Objective: Vitals:   10/25/19 0519 10/25/19 1608 10/25/19 2048 10/26/19 0636  BP: (!) 164/78 128/78 138/79 (!) 165/81  Pulse: 65 69 70 65  Resp: 18 18 18 18   Temp: 97.9 F (36.6 C) 98.2 F (36.8 C) 98 F (36.7 C) 98 F (36.7 C)  TempSrc: Oral  Oral Oral Oral  SpO2: 100% 100% 100% 100%  Weight:      Height:  Intake/Output Summary (Last 24 hours) at 10/26/2019 1225 Last data filed at 10/26/2019 0400 Gross per 24 hour  Intake 200 ml  Output --  Net 200 ml   Filed Weights   10/22/19 1400  Weight: 49.2 kg    Examination:  Gen: Cachectic, chronically ill-appearing African-American male pleasant, sitting up in bed, AAO x2, no distress HEENT: No acute no JVD, poor dental hygiene, right earlobe deformity noted Lungs: Improved air movement, clear CVS: RRR,No Gallops,Rubs or new Murmurs Abd: Soft, mildly distended, nontender, bowel sounds present  extremities: Trace edema Skin: Chronic skin changes Psychiatry: Poor insight and judgment    Data Reviewed:   CBC: Recent Labs  Lab 10/21/19 1345 10/23/19 0524 10/24/19 0446 10/25/19 0427 10/25/19 1324 10/26/19 0504 10/26/19 0822  WBC 8.4   < > 9.2 8.0 8.1 7.8 7.6  NEUTROABS 7.0  --   --   --   --   --   --   HGB 11.1*   < > 9.0* 7.8* 9.2* 7.6* 8.5*  HCT 35.8*   < > 28.2* 24.9* 28.9* 24.6* 29.8*  MCV 96.0   < > 94.6 95.0 95.4 95.3 105.7*  PLT 254   < > 189 174 200 182 169   < > = values in this interval not displayed.   Basic Metabolic Panel: Recent Labs  Lab 10/21/19 1750 10/22/19 0150 10/22/19 0550 10/23/19 0524 10/24/19 0446 10/25/19 0427 10/26/19 0504  NA 138   < > 134* 136 138 136 136  K 3.6   < > 3.6 3.3* 4.5 4.4 4.2  CL 104   < > 101 102 104 103 101  CO2 28   < > 28 30 29 29 29   GLUCOSE 519*   < > 106* 42* 195* 186* 295*  BUN 14   < > 11 11 18  21* 21*  CREATININE 0.69   < > 0.50* 0.35* 0.47* 0.52* 0.53*  CALCIUM 8.0*   < > 7.7* 7.6* 8.1* 8.0* 7.9*  MG 1.7  --   --   --   --   --   --    < > = values in this interval not displayed.   GFR: Estimated Creatinine Clearance: 72.6 mL/min (A) (by C-G formula based on SCr of 0.53 mg/dL (L)). Liver Function Tests: Recent Labs  Lab 10/21/19 1345 10/23/19 0524  AST 113* 105*  ALT 143* 89*   ALKPHOS 722* 505*  BILITOT 0.6 0.4  PROT 6.2* 4.7*  ALBUMIN 2.9* 2.1*   No results for input(s): LIPASE, AMYLASE in the last 168 hours. No results for input(s): AMMONIA in the last 168 hours. Coagulation Profile: No results for input(s): INR, PROTIME in the last 168 hours. Cardiac Enzymes: No results for input(s): CKTOTAL, CKMB, CKMBINDEX, TROPONINI in the last 168 hours. BNP (last 3 results) No results for input(s): PROBNP in the last 8760 hours. HbA1C: No results for input(s): HGBA1C in the last 72 hours. CBG: Recent Labs  Lab 10/25/19 0755 10/25/19 1205 10/25/19 1706 10/25/19 2046 10/26/19 0738  GLUCAP 169* 234* 92 224* 323*   Lipid Profile: No results for input(s): CHOL, HDL, LDLCALC, TRIG, CHOLHDL, LDLDIRECT in the last 72 hours. Thyroid Function Tests: No results for input(s): TSH, T4TOTAL, FREET4, T3FREE, THYROIDAB in the last 72 hours. Anemia Panel: Recent Labs    10/25/19 1324  VITAMINB12 853  FOLATE 55.9  FERRITIN 287  TIBC 186*  IRON 42*  RETICCTPCT 1.9   Urine analysis:    Component Value Date/Time  COLORURINE YELLOW 10/21/2019 [place] 10/21/2019 1346   LABSPEC 1.026 10/21/2019 1346   PHURINE 6.0 10/21/2019 1346   GLUCOSEU >=500 (A) 10/21/2019 1346   HGBUR NEGATIVE 10/21/2019 1346   BILIRUBINUR NEGATIVE 10/21/2019 1346   BILIRUBINUR negative 03/15/2018 1049   BILIRUBINUR negative 12/26/2017 0941   KETONESUR NEGATIVE 10/21/2019 1346   PROTEINUR 30 (A) 10/21/2019 1346   UROBILINOGEN 0.2 03/15/2018 1049   UROBILINOGEN 0.2 12/08/2010 1613   NITRITE NEGATIVE 10/21/2019 1346   LEUKOCYTESUR SMALL (A) 10/21/2019 1346   Sepsis Labs: @LABRCNTIP (procalcitonin:4,lacticidven:4)  ) Recent Results (from the past 240 hour(s))  Culture, Urine     Status: Abnormal   Collection Time: 10/21/19  1:46 PM   Specimen: Urine, Random  Result Value Ref Range Status   Specimen Description   Final    URINE, RANDOM Performed at [facility] [address], [place], [place] [address]    Special Requests   Final    NONE Performed at [facility], [place] [address] [place], [place] [address]    Culture >=100,000 COLONIES/mL STAPHYLOCOCCUS AUREUS (A)  Final   Report Status 10/24/2019 FINAL  Final   Organism ID, Bacteria STAPHYLOCOCCUS AUREUS (A)  Final      Susceptibility   Staphylococcus aureus - MIC*    CIPROFLOXACIN <=0.5 SENSITIVE Sensitive     GENTAMICIN <=0.5 SENSITIVE Sensitive     NITROFURANTOIN <=16 SENSITIVE Sensitive     OXACILLIN <=0.25 SENSITIVE Sensitive     TETRACYCLINE <=1 SENSITIVE Sensitive     VANCOMYCIN <=0.5 SENSITIVE Sensitive     TRIMETH/SULFA <=10 SENSITIVE Sensitive     CLINDAMYCIN <=0.25 SENSITIVE Sensitive     RIFAMPIN <=0.5 SENSITIVE Sensitive     Inducible Clindamycin NEGATIVE Sensitive     * >=100,000 COLONIES/mL STAPHYLOCOCCUS AUREUS  Culture, blood (routine x 2)     Status: None   Collection Time: 10/21/19  3:30 PM   Specimen: BLOOD  Result Value Ref Range Status   Specimen Description   Final    BLOOD LEFT ANTECUBITAL Performed at [facility] [address], [place], [place] [address]    Special Requests   Final    BOTTLES DRAWN AEROBIC ONLY Blood Culture adequate volume Performed at [facility] [address], [place], [place] [address]    Culture   Final    NO GROWTH 5 DAYS Performed at [facility], [place] [address] [place], [place] [address]    Report Status 10/26/2019 FINAL  Final  Culture, blood (routine x 2)     Status: None   Collection Time: 10/21/19  3:30 PM   Specimen: BLOOD LEFT FOREARM  Result Value Ref Range Status   Specimen Description   Final    BLOOD LEFT FOREARM Performed at [facility] [address], [place], [place] [address]    Special Requests   Final    BOTTLES DRAWN AEROBIC ONLY Blood Culture results may not be optimal due to an inadequate volume  of blood received in culture bottles Performed at [facility] [address] [place], [place] [address]    Culture   Final    NO GROWTH 5 DAYS Performed at [facility], [place] [address], [place], [place] [address]    Report Status 10/26/2019 FINAL  Final  SARS CORONAVIRUS 2 (TAT 6-24 HRS) Nasopharyngeal Nasopharyngeal Swab     Status: None   Collection Time: 10/21/19  4:06 PM   Specimen: Nasopharyngeal Swab  Result Value Ref Range Status   SARS Coronavirus 2 NEGATIVE NEGATIVE Final    Comment: (NOTE) SARS-CoV-2 target nucleic acids are NOT DETECTED. The SARS-CoV-2 RNA is generally detectable in upper and lower respiratory specimens during the acute phase of infection. Negative results do not preclude SARS-CoV-2 infection, do not rule out co-infections with other pathogens, and should not be used as the sole basis for treatment or other patient management decisions. Negative results must be combined with clinical observations, patient history, and epidemiological information. The expected result is Negative. Fact Sheet for Patients: HairSlick.no Fact Sheet for Healthcare Providers: quierodirigir.com This test is not yet approved or cleared by the [place] FDA and  has been authorized for detection and/or diagnosis of SARS-CoV-2 by FDA under an Emergency Use Authorization (EUA). This EUA will remain  in effect (meaning this test can be used) for the duration of the COVID-19 declaration under Section 56 4(b)(1) of the Act, 21 U.S.C. section 360bbb-3(b)(1), unless the authorization is terminated or revoked sooner. Performed at [facility], [address], [place], [place] [address]          Radiology Studies: No results found.      Scheduled Meds: . atorvastatin  20 mg Oral q1800  . enoxaparin (LOVENOX) injection  40 mg Subcutaneous Q24H  . escitalopram  5 mg Oral Daily  .  feeding supplement (GLUCERNA SHAKE)  237 mL Oral TID BM  . folic acid  1 mg Oral Daily  . furosemide  20 mg Oral Daily  . insulin aspart  0-5 Units Subcutaneous QHS  . insulin aspart  0-9 Units Subcutaneous TID WC  . insulin aspart  2 Units Subcutaneous TID WC  . insulin detemir  12 Units Subcutaneous Daily  . lisinopril  20 mg Oral Daily  . nicotine  14 mg Transdermal Daily   Continuous Infusions:    LOS: 5 days    Time spent:   Zannie Cove, MD [facility] 10/26/2019, 12:25 PM

## 2019-10-27 ENCOUNTER — Inpatient Hospital Stay (HOSPITAL_COMMUNITY): Payer: Self-pay

## 2019-10-27 LAB — COMPREHENSIVE METABOLIC PANEL
ALT: 138 U/L — ABNORMAL HIGH (ref 0–44)
AST: 305 U/L — ABNORMAL HIGH (ref 15–41)
Albumin: 2.1 g/dL — ABNORMAL LOW (ref 3.5–5.0)
Alkaline Phosphatase: 393 U/L — ABNORMAL HIGH (ref 38–126)
Anion gap: 7 (ref 5–15)
BUN: 21 mg/dL — ABNORMAL HIGH (ref 6–20)
CO2: 25 mmol/L (ref 22–32)
Calcium: 8 mg/dL — ABNORMAL LOW (ref 8.9–10.3)
Chloride: 105 mmol/L (ref 98–111)
Creatinine, Ser: 0.64 mg/dL (ref 0.61–1.24)
GFR calc Af Amer: >60 mL/min (ref >60)
GFR calc non Af Amer: >60 mL/min (ref >60)
Glucose, Bld: 313 mg/dL — ABNORMAL HIGH (ref 70–99)
Potassium: 4.5 mmol/L (ref 3.5–5.1)
Sodium: 137 mmol/L (ref 135–145)
Total Bilirubin: 0.5 mg/dL (ref 0.3–1.2)
Total Protein: 4.8 g/dL — ABNORMAL LOW (ref 6.5–8.1)

## 2019-10-27 LAB — PREPARE RBC (CROSSMATCH)

## 2019-10-27 LAB — GLUCOSE, CAPILLARY
Glucose-Capillary: 310 mg/dL — ABNORMAL HIGH (ref 70–99)
Glucose-Capillary: 267 mg/dL — ABNORMAL HIGH (ref 70–99)
Glucose-Capillary: 291 mg/dL — ABNORMAL HIGH (ref 70–99)
Glucose-Capillary: 286 mg/dL — ABNORMAL HIGH (ref 70–99)

## 2019-10-27 LAB — CBC
HCT: 23.6 % — ABNORMAL LOW (ref 39.0–52.0)
Hemoglobin: 7.3 g/dL — ABNORMAL LOW (ref 13.0–17.0)
MCH: 30.3 pg (ref 26.0–34.0)
MCHC: 30.9 g/dL (ref 30.0–36.0)
MCV: 97.9 fL (ref 80.0–100.0)
Platelets: 190 10*3/uL (ref 150–400)
RBC: 2.41 MIL/uL — ABNORMAL LOW (ref 4.22–5.81)
RDW: 14.6 % (ref 11.5–15.5)
WBC: 7.3 10*3/uL (ref 4.0–10.5)
nRBC: 0 % (ref 0.0–0.2)

## 2019-10-27 LAB — OCCULT BLOOD X 1 CARD TO LAB, STOOL: Fecal Occult Bld: POSITIVE — AB

## 2019-10-27 MED ORDER — SODIUM CHLORIDE 0.9% IV SOLUTION: Freq: Once | INTRAVENOUS | Status: AC

## 2019-10-27 MED ORDER — INSULIN GLARGINE 100 UNIT/ML ~~LOC~~ SOLN
8.0000 [IU] | Freq: Once | SUBCUTANEOUS | Status: AC
  Administered 2019-10-27: 8 [IU] via SUBCUTANEOUS
  Filled 2019-10-27: qty 0.08

## 2019-10-27 MED ORDER — INSULIN DETEMIR 100 UNIT/ML ~~LOC~~ SOLN
22.0000 [IU] | Freq: Every day | SUBCUTANEOUS | Status: DC
  Administered 2019-10-28 – 2019-10-29 (×2): 22 [IU] via SUBCUTANEOUS
  Filled 2019-10-27 (×2): qty 0.22

## 2019-10-27 MED ORDER — IOHEXOL 9 MG/ML PO SOLN
ORAL | Status: AC
  Filled 2019-10-27: qty 1000

## 2019-10-27 MED ORDER — SODIUM CHLORIDE (PF) 0.9 % IJ SOLN
INTRAMUSCULAR | Status: AC
  Filled 2019-10-27: qty 50

## 2019-10-27 MED ORDER — INSULIN ASPART 100 UNIT/ML ~~LOC~~ SOLN
6.0000 [IU] | Freq: Three times a day (TID) | SUBCUTANEOUS | Status: DC
  Administered 2019-10-27 – 2019-10-29 (×6): 6 [IU] via SUBCUTANEOUS

## 2019-10-27 MED ORDER — IOHEXOL 300 MG/ML  SOLN
100.0000 mL | Freq: Once | INTRAMUSCULAR | Status: AC | PRN
  Administered 2019-10-27: 100 mL via INTRAVENOUS

## 2019-10-27 MED ORDER — IOHEXOL 9 MG/ML PO SOLN
500.0000 mL | ORAL | Status: AC
  Administered 2019-10-27 (×2): 500 mL via ORAL

## 2019-10-27 NOTE — Progress Notes (Addendum)
PROGRESS NOTE    Rodney Barber  TJQ:[id number] DOB: 10/26/1965 DOA: 10/21/2019 PCP: Patient, No Pcp Per  Brief Narrative: Rodney Barber is a 54 y.o.  homeless male with history h/o diabetes mellitus 2, hypertension, pancreatitis, tobacco use, who resides at a shelter  presented with complaints of dizziness that he has experienced over the last couple of days, patient is a very poor historian, in the ED he was noted to have a blood sugar of 947 with a bicarb of 28, he was also noted to be hypothermic on admission and had peripheral edema. -Admitted with hyperosmolar nonketotic hyperglycemia, -Hospitalization complicated by ongoing diarrhea and anemia  Assessment & Plan:  Hyperosmolar hyperglycemic state  Admitted with blood glucose of 947 with metabolic encephalopathy: -Transitioned off insulin drip, continue Lantus -CBGs CBGs running high again, per staff patient is eating double portions with each meal -CBGs still uncontrolled, increase Lantus and premeal NovoLog -Patient is homeless, lives at [facility] with limited access to healthcare, case management consulted and following, will need a prescription for glucometer, insulin and supplies at discharge  Acute diastolic CHF Hypoalbuminemia -Suspect his longstanding lower extremity edema is likely combination of diastolic dysfunction and chronic hypoalbuminemia with third spacing - 2D echocardiogram with preserved EF, diastolic dysfunction noted -Overall improving, appears close to euvolemic, -IV Lasix discontinued -Transition to oral Lasix, continue this daily -there is a chance pt has liver disease from long standing ETOH use and hypoalbuminemia  Acute on chronic anemia -Baseline hemoglobin around 9-10 range, hemoglobin dropped to 7.3  -No overt bleeding -Anemia panel suggestive of chronic disease -Check CT abdomen pelvis, to rule out occult malignancy -Transfuse 1 unit of PRBC today -If above work-up is negative and no  active bleeding will need outpatient work-up for chronic anemia  Elevated AST/ALT/Alkaline phosphatase- -appears to be recurrent and slightly worse from prior  -some component of AST/ALT is likely due to ETOH liver dz, but wouldn't explain high ALP -CT without acute findings -needs GI follow-up for this   Chronic alcoholic pancreatitis: -Symptoms at this time  Chronic diarrhea -Could be secondary to malabsorption from chronic pancreatitis -Add Creon, Imodium as needed  Hypothermia:  -Likely secondary to above  -TSH within normal limits  -UA appears unremarkable, just discontinued ceftriaxone -Resolved  Hyponatremia:  -Likely pseudohyponatremia in the setting of significant hyperglycemia -Improved  Underweight/Severe malnutrition:Body mass index is 14.85 kg/m -RD consult, increase protein supplements advised  Essential hypertension: Resume home medication-lisinopril.   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    Tobacco use:  Counseled, nicotine patch  Medication noncompliance: Counseled extensively as discussed above.  Diabetes educator to follow-up.  DVT prophylaxis: Lovenox  Code Status: DNR per patients wishes Patient/Family Communication:  No family at bedside, discussed care with patient Disposition: Back to shelter tomorrow if hemoglobin is stable  Consults called: Diabetes educator Principal Problem:     Procedures:   Antimicrobials:    Subjective: -Feels okay, mild chronic diarrhea, denies melena or hematochezia, CBGs uncontrolled  Objective: Vitals:   10/26/19 1405 10/26/19 2000 10/27/19 0548 10/27/19 1132  BP: 108/64 121/68 140/72 138/77  Pulse: 83 75 67 71  Resp: 17 18 18 16   Temp: 97.9 F (36.6 C) 98.5 F (36.9 C) 98.3 F (36.8 C) 98.2 F (36.8 C)  TempSrc: Oral Oral Oral Oral  SpO2: 98% 98% 95% 100%  Weight:      Height:        Intake/Output Summary (Last 24  hours) at 10/27/2019 1323 Last data filed at 10/27/2019 1308 Gross per 24 hour  Intake 2752 ml  Output --  Net 2752 ml   Filed Weights   10/22/19 1400  Weight: 49.2 kg    Examination:  Gen: Cachectic chronically ill-appearing African-American male sitting up in bed, AAO x2, no distress HEENT: No JVD, right earlobe deformity Lungs: Decreased breath sounds the bases CVS: RRR,No Gallops,Rubs or new Murmurs Abd: soft, Non tender, mildly distended, BS present Extremities: No edema today Skin: Chronic skin changes Psychiatry: Poor insight and judgment    Data Reviewed:   CBC: Recent Labs  Lab 10/21/19 1345 10/23/19 0524 10/25/19 0427 10/25/19 1324 10/26/19 0504 10/26/19 0822 10/27/19 0453  WBC 8.4   < > 8.0 8.1 7.8 7.6 7.3  NEUTROABS 7.0  --   --   --   --   --   --   HGB 11.1*   < > 7.8* 9.2* 7.6* 8.5* 7.3*  HCT 35.8*   < > 24.9* 28.9* 24.6* 29.8* 23.6*  MCV 96.0   < > 95.0 95.4 95.3 105.7* 97.9  PLT 254   < > 174 200 182 169 190   < > = values in this interval not displayed.   Basic Metabolic Panel: Recent Labs  Lab 10/21/19 1750 10/22/19 0150 10/23/19 0524 10/24/19 0446 10/25/19 0427 10/26/19 0504 10/27/19 0453  NA 138   < > 136 138 136 136 137  K 3.6   < > 3.3* 4.5 4.4 4.2 4.5  CL 104   < > 102 104 103 101 105  CO2 28   < > 30 29 29 29 25   GLUCOSE 519*   < > 42* 195* 186* 295* 313*  BUN 14   < > 11 18 21* 21* 21*  CREATININE 0.69   < > 0.35* 0.47* 0.52* 0.53* 0.64  CALCIUM 8.0*   < > 7.6* 8.1* 8.0* 7.9* 8.0*  MG 1.7  --   --   --   --   --   --    < > = values in this interval not displayed.   GFR: Estimated Creatinine Clearance: 72.6 mL/min (by C-G formula based on SCr of 0.64 mg/dL). Liver Function Tests: Recent Labs  Lab 10/21/19 1345 10/23/19 0524 10/27/19 0453  AST 113* 105* 305*  ALT 143* 89* 138*  ALKPHOS 722* 505* 393*  BILITOT 0.6 0.4 0.5  PROT 6.2* 4.7* 4.8*  ALBUMIN 2.9* 2.1* 2.1*   No results for input(s): LIPASE, AMYLASE in the  last 168 hours. No results for input(s): AMMONIA in the last 168 hours. Coagulation Profile: No results for input(s): INR, PROTIME in the last 168 hours. Cardiac Enzymes: No results for input(s): CKTOTAL, CKMB, CKMBINDEX, TROPONINI in the last 168 hours. BNP (last 3 results) No results for input(s): PROBNP in the last 8760 hours. HbA1C: No results for input(s): HGBA1C in the last 72 hours. CBG: Recent Labs  Lab 10/26/19 1225 10/26/19 1632 10/26/19 1956 10/27/19 0756 10/27/19 1130  GLUCAP 357* 166* 158* 310* 267*   Lipid Profile: No results for input(s): CHOL, HDL, LDLCALC, TRIG, CHOLHDL, LDLDIRECT in the last 72 hours. Thyroid Function Tests: No results for input(s): TSH, T4TOTAL, FREET4, T3FREE, THYROIDAB in the last 72 hours. Anemia Panel: Recent Labs    10/25/19 1324  VITAMINB12 853  FOLATE 55.9  FERRITIN 287  TIBC 186*  IRON 42*  RETICCTPCT 1.9   Urine analysis:    Component Value Date/Time   COLORURINE YELLOW  10/21/2019 [place] 10/21/2019 1346   LABSPEC 1.026 10/21/2019 1346   PHURINE 6.0 10/21/2019 1346   GLUCOSEU >=500 (A) 10/21/2019 1346   HGBUR NEGATIVE 10/21/2019 1346   BILIRUBINUR NEGATIVE 10/21/2019 1346   BILIRUBINUR negative 03/15/2018 1049   BILIRUBINUR negative 12/26/2017 0941   KETONESUR NEGATIVE 10/21/2019 1346   PROTEINUR 30 (A) 10/21/2019 1346   UROBILINOGEN 0.2 03/15/2018 1049   UROBILINOGEN 0.2 12/08/2010 1613   NITRITE NEGATIVE 10/21/2019 1346   LEUKOCYTESUR SMALL (A) 10/21/2019 1346   Sepsis Labs: @LABRCNTIP (procalcitonin:4,lacticidven:4)  ) Recent Results (from the past 240 hour(s))  Culture, Urine     Status: Abnormal   Collection Time: 10/21/19  1:46 PM   Specimen: Urine, Random  Result Value Ref Range Status   Specimen Description   Final    URINE, RANDOM Performed at [facility] [address] [place], [place] [address]    Special Requests   Final    NONE Performed at [facility], [place] [address], [place], [place] [address]    Culture >=100,000 COLONIES/mL STAPHYLOCOCCUS AUREUS (A)  Final   Report Status 10/24/2019 FINAL  Final   Organism ID, Bacteria STAPHYLOCOCCUS AUREUS (A)  Final      Susceptibility   Staphylococcus aureus - MIC*    CIPROFLOXACIN <=0.5 SENSITIVE Sensitive     GENTAMICIN <=0.5 SENSITIVE Sensitive     NITROFURANTOIN <=16 SENSITIVE Sensitive     OXACILLIN <=0.25 SENSITIVE Sensitive     TETRACYCLINE <=1 SENSITIVE Sensitive     VANCOMYCIN <=0.5 SENSITIVE Sensitive     TRIMETH/SULFA <=10 SENSITIVE Sensitive     CLINDAMYCIN <=0.25 SENSITIVE Sensitive     RIFAMPIN <=0.5 SENSITIVE Sensitive     Inducible Clindamycin NEGATIVE Sensitive     * >=100,000 COLONIES/mL STAPHYLOCOCCUS AUREUS  Culture, blood (routine x 2)     Status: None   Collection Time: 10/21/19  3:30 PM   Specimen: BLOOD  Result Value Ref Range Status   Specimen Description   Final    BLOOD LEFT ANTECUBITAL Performed at [facility] [address], [place], [place] [address]    Special Requests   Final    BOTTLES DRAWN AEROBIC ONLY Blood Culture adequate volume Performed at [facility] [address] [place], [place] [address]    Culture   Final    NO GROWTH 5 DAYS Performed at [facility], [place] [address] [place], [place] [address]    Report Status 10/26/2019 FINAL  Final  Culture, blood (routine x 2)     Status: None   Collection Time: 10/21/19  3:30 PM   Specimen: BLOOD LEFT FOREARM  Result Value Ref Range Status   Specimen Description   Final    BLOOD LEFT FOREARM Performed at [facility] [address] [place], [place] [address]    Special Requests   Final    BOTTLES DRAWN AEROBIC ONLY Blood Culture results may not be optimal due to an inadequate volume of blood received in culture bottles Performed at [facility] [address], [place], [place] [address]      Culture   Final    NO GROWTH 5 DAYS Performed at [facility], [place] [address], [place], [place] [address]    Report Status 10/26/2019 FINAL  Final  SARS CORONAVIRUS 2 (TAT 6-24 HRS) Nasopharyngeal Nasopharyngeal Swab     Status: None   Collection Time: 10/21/19  4:06  PM   Specimen: Nasopharyngeal Swab  Result Value Ref Range Status   SARS Coronavirus 2 NEGATIVE NEGATIVE Final    Comment: (NOTE) SARS-CoV-2 target nucleic acids are NOT DETECTED. The SARS-CoV-2 RNA is generally detectable in upper and lower respiratory specimens during the acute phase of infection. Negative results do not preclude SARS-CoV-2 infection, do not rule out co-infections with other pathogens, and should not be used as the sole basis for treatment or other patient management decisions. Negative results must be combined with clinical observations, patient history, and epidemiological information. The expected result is Negative. Fact Sheet for Patients: HairSlick.no Fact Sheet for Healthcare Providers: quierodirigir.com This test is not yet approved or cleared by the [place] FDA and  has been authorized for detection and/or diagnosis of SARS-CoV-2 by FDA under an Emergency Use Authorization (EUA). This EUA will remain  in effect (meaning this test can be used) for the duration of the COVID-19 declaration under Section 56 4(b)(1) of the Act, 21 U.S.C. section 360bbb-3(b)(1), unless the authorization is terminated or revoked sooner. Performed at [facility], [address], [place], [place] [address]          Radiology Studies: CT ABDOMEN PELVIS W CONTRAST  Result Date: 10/27/2019 CLINICAL DATA:  54 year old male with history of worsening in the media and weight loss. EXAM: CT ABDOMEN AND PELVIS WITH CONTRAST TECHNIQUE: Multidetector CT imaging of the abdomen and pelvis was performed using the standard protocol following bolus  administration of intravenous contrast. CONTRAST:  OMNIPAQUE IOHEXOL 300 MG/ML  SOLN COMPARISON:  CT the abdomen and pelvis 07/10/2013. FINDINGS: Lower chest: Small bilateral pleural effusions lying dependently with some associated passive subsegmental atelectasis in the lower lobes of the lungs bilaterally. Extensive nodularity noted in the posterior aspect of the left lower lobe (axial image 1 of series 7) measuring 1.9 x 1.5 cm. Atherosclerotic calcifications in the left main, left anterior descending, left circumflex and right coronary arteries. Hepatobiliary: No suspicious cystic or solid hepatic lesions. No intra or extrahepatic biliary ductal dilatation. Gallbladder is normal in appearance. Pancreas: Widespread coarse calcifications scattered throughout the pancreatic parenchyma, along with diffuse pancreatic atrophy, sequela of chronic pancreatitis. The largest of these calcifications are in the pancreatic head measuring up to 1.2 x 0.8 cm (axial image 31 of series 2), potentially within the duct. Diffuse ductal dilatation measuring up to 9 mm in the body of the pancreas. No peripancreatic fluid collections or inflammatory changes. Spleen: Unremarkable. Adrenals/Urinary Tract: There are multiple calculi in the collecting systems of both kidneys, most of which appear nonobstructive, measuring up to 9 mm in the lower pole collecting system of the right kidney. In addition, in the interpolar collecting system of the left kidney there is a large calculus measuring 1.0 x 1.8 x 1.2 cm which is associated with some caliectasis throughout the left renal collecting system, indicating mild obstruction. No hydroureter. Urinary bladder is normal in appearance. Bilateral adrenal glands are normal in appearance. Stomach/Bowel: Unenhanced appearance of the stomach is normal. No pathologic dilatation of small bowel or colon. Large volume of stool throughout the colon and rectum may suggest constipation.  Vascular/Lymphatic: Aortic atherosclerosis, without evidence of aneurysm or dissection in the abdominal or pelvic vasculature. No lymphadenopathy noted in the abdomen or pelvis. Reproductive: Prostate gland and seminal vesicles are unremarkable in appearance. Other: No significant volume of ascites.  No pneumoperitoneum. Musculoskeletal: There are no aggressive appearing lytic or blastic lesions noted in the visualized portions of the skeleton. IMPRESSION:  1. 1.0 x 1.8 x 1.2 cm calculus in the interpolar collecting system of left kidney which appears mildly obstructive as evidenced by mild diffuse caliectasis throughout the left kidney. 2. Multiple additional nonobstructive calculi in both renal collecting systems measuring up to 9 mm in the lower pole collecting system of the right kidney. 3. Morphologic changes in the pancreas compatible with chronic pancreatitis. Probable obstructing ductal calculus in the pancreatic head with diffuse pancreatic ductal dilatation. 4. Extensive clustered nodularity lying dependently in the left lower lobe. The appearance is suggestive of infection/inflammation, potentially related to prior aspiration. However, close attention on follow-up noncontrast chest CT is recommended within 1 month to ensure the resolution of this finding, as neoplasm is not favored but difficult to entirely exclude. 5. Small bilateral pleural effusions lying dependently. 6. Aortic atherosclerosis, in addition to left main and 3 vessel coronary artery disease. Please note that although the presence of coronary artery calcium documents the presence of coronary artery disease, the severity of this disease and any potential stenosis cannot be assessed on this non-gated CT examination. Assessment for potential risk factor modification, dietary therapy or pharmacologic therapy may be warranted, if clinically indicated. Electronically Signed   By: Trudie Reed M.D.   On: 10/27/2019 11:53         Scheduled Meds: . sodium chloride   Intravenous Once  . atorvastatin  20 mg Oral q1800  . enoxaparin (LOVENOX) injection  40 mg Subcutaneous Q24H  . escitalopram  5 mg Oral Daily  . feeding supplement (GLUCERNA SHAKE)  237 mL Oral TID BM  . folic acid  1 mg Oral Daily  . furosemide  20 mg Oral Daily  . insulin aspart  0-5 Units Subcutaneous QHS  . insulin aspart  0-9 Units Subcutaneous TID WC  . insulin aspart  4 Units Subcutaneous TID WC  . insulin detemir  15 Units Subcutaneous Daily  . iohexol      . lipase/protease/amylase  12,000 Units Oral TID AC  . lisinopril  20 mg Oral Daily  . nicotine  14 mg Transdermal Daily  . sodium chloride (PF)       Continuous Infusions:    LOS: 6 days    Time spent:   Zannie Cove, MD [facility] 10/27/2019, 1:23 PM

## 2019-10-28 DIAGNOSIS — K860 Alcohol-induced chronic pancreatitis: Secondary | ICD-10-CM

## 2019-10-28 DIAGNOSIS — R945 Abnormal results of liver function studies: Secondary | ICD-10-CM

## 2019-10-28 DIAGNOSIS — Z9119 Patient's noncompliance with other medical treatment and regimen: Secondary | ICD-10-CM

## 2019-10-28 DIAGNOSIS — E43 Unspecified severe protein-calorie malnutrition: Secondary | ICD-10-CM

## 2019-10-28 DIAGNOSIS — E1100 Type 2 diabetes mellitus with hyperosmolarity without nonketotic hyperglycemic-hyperosmolar coma (NKHHC): Principal | ICD-10-CM

## 2019-10-28 DIAGNOSIS — K8689 Other specified diseases of pancreas: Secondary | ICD-10-CM

## 2019-10-28 LAB — TYPE AND SCREEN
ABO/RH(D): O POS
Antibody Screen: POSITIVE
Donor AG Type: NEGATIVE
Unit division: 0

## 2019-10-28 LAB — COMPREHENSIVE METABOLIC PANEL
ALT: 129 U/L — ABNORMAL HIGH (ref 0–44)
AST: 201 U/L — ABNORMAL HIGH (ref 15–41)
Albumin: 2.1 g/dL — ABNORMAL LOW (ref 3.5–5.0)
Alkaline Phosphatase: 364 U/L — ABNORMAL HIGH (ref 38–126)
Anion gap: 5 (ref 5–15)
BUN: 21 mg/dL — ABNORMAL HIGH (ref 6–20)
CO2: 24 mmol/L (ref 22–32)
Calcium: 7.7 mg/dL — ABNORMAL LOW (ref 8.9–10.3)
Chloride: 105 mmol/L (ref 98–111)
Creatinine, Ser: 0.56 mg/dL — ABNORMAL LOW (ref 0.61–1.24)
GFR calc Af Amer: >60 mL/min (ref >60)
GFR calc non Af Amer: >60 mL/min (ref >60)
Glucose, Bld: 195 mg/dL — ABNORMAL HIGH (ref 70–99)
Potassium: 4 mmol/L (ref 3.5–5.1)
Sodium: 134 mmol/L — ABNORMAL LOW (ref 135–145)
Total Bilirubin: 0.5 mg/dL (ref 0.3–1.2)
Total Protein: 4.7 g/dL — ABNORMAL LOW (ref 6.5–8.1)

## 2019-10-28 LAB — CBC
HCT: 27.8 % — ABNORMAL LOW (ref 39.0–52.0)
Hemoglobin: 8.7 g/dL — ABNORMAL LOW (ref 13.0–17.0)
MCH: 29.2 pg (ref 26.0–34.0)
MCHC: 31.3 g/dL (ref 30.0–36.0)
MCV: 93.3 fL (ref 80.0–100.0)
Platelets: 189 10*3/uL (ref 150–400)
RBC: 2.98 MIL/uL — ABNORMAL LOW (ref 4.22–5.81)
RDW: 17.7 % — ABNORMAL HIGH (ref 11.5–15.5)
WBC: 7.9 10*3/uL (ref 4.0–10.5)
nRBC: 0 % (ref 0.0–0.2)

## 2019-10-28 LAB — GLUCOSE, CAPILLARY
Glucose-Capillary: 179 mg/dL — ABNORMAL HIGH (ref 70–99)
Glucose-Capillary: 247 mg/dL — ABNORMAL HIGH (ref 70–99)
Glucose-Capillary: 122 mg/dL — ABNORMAL HIGH (ref 70–99)
Glucose-Capillary: 181 mg/dL — ABNORMAL HIGH (ref 70–99)

## 2019-10-28 LAB — BPAM RBC
Blood Product Expiration Date: 202104222359
ISSUE DATE / TIME: 202103221737
Unit Type and Rh: 5100

## 2019-10-28 NOTE — Progress Notes (Signed)
Supervised all medications given by Job Founds between 10:00 am and 10:30 am.

## 2019-10-28 NOTE — Consult Note (Addendum)
Referring Provider: Dr. Domenic Polite  Primary Care Physician:  Patient, No Pcp Per Primary Gastroenterologist:  Althia Forts.   Reason for Consultation:  Anemia, FOBT +  HPI: Rodney Barber is a 54 y.o. male with a past medical history of  hypertension, tobacco use, diabetes mellitus type 2, chronic pancreatitis, prior alcohol abuse and chronic pancreatitis with a chronic pancreatic stricture. He is homeless, living in a homeless shelter for the past 4 months.  He was admitted to [facility] with hyperosmolar nonketotic hyperglycemia and metabolic encephalopathy.  His admission glucose level was 947.  He reported losing 40 lbs over the past 4 months which he attributes to eating poorly at the homeless shelter, the food is not appealing . His hospitalization was complicated by diarrhea and anemia therefore a GI consult was requested today.  He denies having any dysphagia or heartburn.  No upper or lower abdominal pain.  He was previously passing to loose stools daily without obvious rectal bleeding.  He reported passing a solid brown-green stool earlier today.  No melena.  No alcohol use for 10+ years.  No drug use.  No NSAID use.  No family history of gastric, colon, liver or pancreatic cancer.  He reports being hospitalized for pancreatitis approximately 2 times over the past 10 years.  He was previously followed by Dr. Watt Climes with [facility] gastroenterology in 2012.  He underwent EUS/ERCP in 2012 due to having a chronic biliary stricture, a stent was placed then removed 06/2020.  He denied having any further follow-up with Dr. Watt Climes since his last ERCP 06/2011.  An abdominal/pelvic CT with contrast was completed on 10/27/2019 which identified chronic pancreatitis with probable obstructing ductal calculus in the pancreatic head and diffuse pancreatic ductal dilatation.  No evidence of intra or extrahepatic ductal dilatation or liver mass.  Laboratory studies 10/28/2019: Sodium 134.  Potassium  4.0.  Glucose 195.  BUN 21.  Creatinine 0.56.  Alk phos 364. Albumin 2.1.  AST 201.  ALT 129.  Total bili 0.5.  WBC 7.9.  Hemoglobin 8.7.  Hematocrit 27.8.  MCV 93.3.  Platelet 189.   Abdominal/pelvic CT with contrast 10/27/2019: 1. 1.0 x 1.8 x 1.2 cm calculus in the interpolar collecting system of left kidney which appears mildly obstructive as evidenced by mild diffuse caliectasis throughout the left kidney. 2. Multiple additional nonobstructive calculi in both renal collecting systems measuring up to 9 mm in the lower pole collecting system of the right kidney. 3. Morphologic changes in the pancreas compatible with chronic pancreatitis. Probable obstructing ductal calculus in the pancreatic head with diffuse pancreatic ductal dilatation. No suspicious cystic or solid hepatic lesions. No intra or extrahepatic biliary ductal dilatation. Gallbladder is normal in appearance 4. Extensive clustered nodularity lying dependently in the left lower lobe. The appearance is suggestive of infection/inflammation, potentially related to prior aspiration. However, close attention on follow-up noncontrast chest CT is recommended within 1 month to ensure the resolution of this finding, as neoplasm is not favored but difficult to entirely exclude. 5. Small bilateral pleural effusions lying dependently. 6. Aortic atherosclerosis, in addition to left main and 3 vessel coronary artery disease. Please note that although the presence of coronary artery calcium documents the presence of coronary artery disease, the severity of this disease and any potential stenosis cannot be assessed on this non-gated CT examination. Assessment for potential risk factor modification, dietary therapy or pharmacologic therapy may be warranted, if clinically indicated  Right upper quadrant abdominal ultrasound 09/10/2019: 1. Chronic enlargement of  the CBD not significantly changed since 2014. 2. Tiny 3 mm gallbladder polyp  versus adherent stone, but no other cholelithiasis and no evidence of acute cholecystitis. 3. Ultrasound appearance of the liver within normal limits.   Past Medical History:  Diagnosis Date  . Diabetes mellitus   . Hypertension   . Pancreatitis     Past Surgical History:  Procedure Laterality Date  . ERCP  06/27/2011   Procedure: ENDOSCOPIC RETROGRADE CHOLANGIOPANCREATOGRAPHY (ERCP);  Surgeon: Jeryl Columbia, MD;  Location: [facility];  Service: Endoscopy;  Laterality: N/A;  . ERCP W/ PLASTIC STENT PLACEMENT  03/2011  . IR US GUIDE BX ASP/DRAIN  05/16/2019  . TEE WITHOUT CARDIOVERSION N/A 05/20/2019   Procedure: TRANSESOPHAGEAL ECHOCARDIOGRAM (TEE);  Surgeon: [name], MD;  Location: [facility] ENDOSCOPY;  Service: Cardiovascular;  Laterality: N/A;    Prior to Admission medications   Medication Sig Start Date End Date Taking? Authorizing Provider  Blood Glucose Monitoring Suppl (TRUERESULT BLOOD GLUCOSE) w/Device KIT uad 09/11/19  Yes Mariel Aloe, MD  glucose blood (TRUE METRIX BLOOD GLUCOSE TEST) test strip Use as instructed 09/11/19  Yes Mariel Aloe, MD  insulin detemir (LEVEMIR) 100 UNIT/ML injection Inject 0.08 mLs (8 Units total) into the skin 2 (two) times daily. Patient taking differently: Inject 20 Units into the skin 2 (two) times daily.  09/11/19  Yes Mariel Aloe, MD  Insulin Syringe-Needle U-100 (INSULIN SYRINGE .3CC/31GX5/16") 31G X 5/16" 0.3 ML MISC Draw 8 units of Levemir with syringe twice per day 09/11/19  Yes Mariel Aloe, MD  TRUEplus Lancets 28G MISC uad 09/11/19  Yes Mariel Aloe, MD  amLODipine (NORVASC) 5 MG tablet Take 1 tablet (5 mg total) by mouth daily. 09/18/19 10/18/19  Elsie Stain, MD  atorvastatin (LIPITOR) 20 MG tablet Take 1 tablet (20 mg total) by mouth daily at 6 PM. 06/25/19 09/09/19  Sheth, Devam P, MD  escitalopram (LEXAPRO) 10 MG tablet Take 0.5 tablets (5 mg total) by mouth daily. 06/25/19 09/09/19  Sheth, Vickii Chafe, MD  feeding supplement,  GLUCERNA SHAKE, (GLUCERNA SHAKE) LIQD Take 237 mLs by mouth 3 (three) times daily between meals. Patient not taking: Reported on 10/21/2019 09/11/19   Mariel Aloe, MD  folic acid (FOLVITE) 1 MG tablet Take 1 tablet (1 mg total) by mouth daily. Patient not taking: Reported on 10/21/2019 06/25/19   Vicenta Dunning, MD  lactulose (CHRONULAC) 10 GM/15ML solution Take 30 mLs (20 g total) by mouth 2 (two) times daily. Patient not taking: Reported on 10/21/2019 03/06/19   Aline August, MD  lisinopril (ZESTRIL) 20 MG tablet Take 1 tablet (20 mg total) by mouth daily. 06/26/19 09/09/19  Sheth, Vickii Chafe, MD  nicotine (NICODERM CQ - DOSED IN MG/24 HOURS) 14 mg/24hr patch Place 1 patch (14 mg total) onto the skin daily. Patient not taking: Reported on 10/21/2019 06/26/19   Vicenta Dunning, MD    Current Facility-Administered Medications  Medication Dose Route Frequency Provider Last Rate Last Admin  . atorvastatin (LIPITOR) tablet 20 mg  20 mg Oral q1800 Guilford Shi, MD   20 mg at 10/27/19 1708  . dextrose 50 % solution 0-50 mL  0-50 mL Intravenous PRN Guilford Shi, MD   10 mL at 10/21/19 2227  . enoxaparin (LOVENOX) injection 40 mg  40 mg Subcutaneous Q24H Guilford Shi, MD   40 mg at 10/27/19 2247  . escitalopram (LEXAPRO) tablet 5 mg  5 mg Oral Daily Guilford Shi, MD  5 mg at 10/28/19 1006  . feeding supplement (GLUCERNA SHAKE) (GLUCERNA SHAKE) liquid 237 mL  237 mL Oral TID BM Guilford Shi, MD   237 mL at 10/28/19 1317  . folic acid (FOLVITE) tablet 1 mg  1 mg Oral Daily Guilford Shi, MD   1 mg at 10/28/19 1007  . furosemide (LASIX) tablet 20 mg  20 mg Oral Daily Domenic Polite, MD   20 mg at 10/28/19 1005  . insulin aspart (novoLOG) injection 0-5 Units  0-5 Units Subcutaneous QHS Guilford Shi, MD   3 Units at 10/27/19 2247  . insulin aspart (novoLOG) injection 0-9 Units  0-9 Units Subcutaneous TID WC Guilford Shi, MD   3 Units at 10/28/19 1318  . insulin aspart  (novoLOG) injection 6 Units  6 Units Subcutaneous TID WC Domenic Polite, MD   6 Units at 10/28/19 1318  . insulin detemir (LEVEMIR) injection 22 Units  22 Units Subcutaneous Daily Domenic Polite, MD   22 Units at 10/28/19 1028  . lipase/protease/amylase (CREON) capsule 12,000 Units  12,000 Units Oral TID Brooke Bonito, MD   12,000 Units at 10/28/19 1317  . lisinopril (ZESTRIL) tablet 20 mg  20 mg Oral Daily Guilford Shi, MD   20 mg at 10/28/19 1007  . nicotine (NICODERM CQ - dosed in mg/24 hours) patch 14 mg  14 mg Transdermal Daily Guilford Shi, MD   14 mg at 10/28/19 1014    Allergies as of 10/21/2019  . (No Known Allergies)    Family History  Problem Relation Age of Onset  . Diabetes Mother   . Diabetes Brother     Social History   Socioeconomic History  . Marital status: Single    Spouse name: Not on file  . Number of children: Not on file  . Years of education: Not on file  . Highest education level: Not on file  Occupational History  . Not on file  Tobacco Use  . Smoking status: Current Every Day Smoker    Packs/day: 0.50    Years: 30.00    Pack years: 15.00    Types: Cigarettes  . Smokeless tobacco: Never Used  . Tobacco comment: 6 cigarett /day  Substance and Sexual Activity  . Alcohol use: No  . Drug use: Not Currently    Types: "Crack" cocaine, Other-see comments, Cocaine    Comment: last smoked crack 12-16-16  . Sexual activity: Never  Other Topics Concern  . Not on file  Social History Narrative  . Not on file   Social Determinants of Health   Financial Resource Strain:   . Difficulty of Paying Living Expenses:   Food Insecurity:   . Worried About Charity fundraiser in the Last Year:   . Arboriculturist in the Last Year:   Transportation Needs:   . Film/video editor (Medical):   Marland Kitchen Lack of Transportation (Non-Medical):   Physical Activity:   . Days of Exercise per Week:   . Minutes of Exercise per Session:   Stress:   .  Feeling of Stress :   Social Connections:   . Frequency of Communication with Friends and Family:   . Frequency of Social Gatherings with Friends and Family:   . Attends Religious Services:   . Active Member of Clubs or Organizations:   . Attends Archivist Meetings:   Marland Kitchen Marital Status:   Intimate Partner Violence:   . Fear of Current or Ex-Partner:   . Emotionally Abused:   .  Physically Abused:   . Sexually Abused:     Review of Systems:  See HPI, all other systems reviewed and are negative   Physical Exam: Vital signs in last 24 hours: Temp:  [97.8 F (36.6 C)-98.2 F (36.8 C)] 98 F (36.7 C) (03/23 1249) Pulse Rate:  [66-87] 67 (03/23 1249) Resp:  [16-24] 20 (03/23 1249) BP: (128-152)/(69-87) 135/87 (03/23 1249) SpO2:  [98 %-100 %] 100 % (03/23 1249) Last BM Date: 10/28/19 General:  Alert ill appearing 54 year old male in NAD.  Head:  Normocephalic and atraumatic. Eyes:  No scleral icterus. Conjunctiva pink. Ears:  Normal auditory acuity. Nose:  No deformity, discharge or lesions. Mouth: Poor dentition.  No ulcers or lesions.  Neck:  Supple. No lymphadenopathy or thyromegaly.  Lungs:  Breath sounds diminished throughout.  Heart:  RRR, no murmur.  Abdomen:  Protuberant, distended suspect ascites without HSM.  Nontender. Hypoactive BS x 4 quads.  Rectal:  Deferred. Msk:  Symmetrical without gross deformities.  Pulses:  Normal pulses noted. Extremities:  Without clubbing or edema. Neurologic:  Alert and  oriented x4. No focal deficits.  Skin:  Intact without significant lesions or rashes. No jaundice.  Psych:  Alert and cooperative. Normal mood and affect.  Intake/Output from previous day: 03/22 0701 - 03/23 0700 In: 3205 [P.O.:2890; Blood:315] Out: -  Intake/Output this shift: Total I/O In: 240 [P.O.:240] Out: -   Lab Results: Recent Labs    10/26/19 0822 10/27/19 0453 10/28/19 0450  WBC 7.6 7.3 7.9  HGB 8.5* 7.3* 8.7*  HCT 29.8* 23.6* 27.8*    PLT 169 190 189   BMET Recent Labs    10/26/19 0504 10/27/19 0453 10/28/19 0450  NA 136 137 134*  K 4.2 4.5 4.0  CL 101 105 105  CO2 29 25 24   GLUCOSE 295* 313* 195*  BUN 21* 21* 21*  CREATININE 0.53* 0.64 0.56*  CALCIUM 7.9* 8.0* 7.7*   LFT Recent Labs    10/28/19 0450  PROT 4.7*  ALBUMIN 2.1*  AST 201*  ALT 129*  ALKPHOS 364*  BILITOT 0.5   PT/INR No results for input(s): LABPROT, INR in the last 72 hours. Hepatitis Panel No results for input(s): HEPBSAG, HCVAB, HEPAIGM, HEPBIGM in the last 72 hours.    Studies/Results: CT ABDOMEN PELVIS W CONTRAST  Result Date: 10/27/2019 CLINICAL DATA:  54 year old male with history of worsening in the media and weight loss. EXAM: CT ABDOMEN AND PELVIS WITH CONTRAST TECHNIQUE: Multidetector CT imaging of the abdomen and pelvis was performed using the standard protocol following bolus administration of intravenous contrast. CONTRAST:  179m OMNIPAQUE IOHEXOL 300 MG/ML  SOLN COMPARISON:  CT the abdomen and pelvis 07/10/2013. FINDINGS: Lower chest: Small bilateral pleural effusions lying dependently with some associated passive subsegmental atelectasis in the lower lobes of the lungs bilaterally. Extensive nodularity noted in the posterior aspect of the left lower lobe (axial image 1 of series 7) measuring 1.9 x 1.5 cm. Atherosclerotic calcifications in the left main, left anterior descending, left circumflex and right coronary arteries. Hepatobiliary: No suspicious cystic or solid hepatic lesions. No intra or extrahepatic biliary ductal dilatation. Gallbladder is normal in appearance. Pancreas: Widespread coarse calcifications scattered throughout the pancreatic parenchyma, along with diffuse pancreatic atrophy, sequela of chronic pancreatitis. The largest of these calcifications are in the pancreatic head measuring up to 1.2 x 0.8 cm (axial image 31 of series 2), potentially within the duct. Diffuse ductal dilatation measuring up to 9 mm in  the body  of the pancreas. No peripancreatic fluid collections or inflammatory changes. Spleen: Unremarkable. Adrenals/Urinary Tract: There are multiple calculi in the collecting systems of both kidneys, most of which appear nonobstructive, measuring up to 9 mm in the lower pole collecting system of the right kidney. In addition, in the interpolar collecting system of the left kidney there is a large calculus measuring 1.0 x 1.8 x 1.2 cm which is associated with some caliectasis throughout the left renal collecting system, indicating mild obstruction. No hydroureter. Urinary bladder is normal in appearance. Bilateral adrenal glands are normal in appearance. Stomach/Bowel: Unenhanced appearance of the stomach is normal. No pathologic dilatation of small bowel or colon. Large volume of stool throughout the colon and rectum may suggest constipation. Vascular/Lymphatic: Aortic atherosclerosis, without evidence of aneurysm or dissection in the abdominal or pelvic vasculature. No lymphadenopathy noted in the abdomen or pelvis. Reproductive: Prostate gland and seminal vesicles are unremarkable in appearance. Other: No significant volume of ascites.  No pneumoperitoneum. Musculoskeletal: There are no aggressive appearing lytic or blastic lesions noted in the visualized portions of the skeleton. IMPRESSION: 1. 1.0 x 1.8 x 1.2 cm calculus in the interpolar collecting system of left kidney which appears mildly obstructive as evidenced by mild diffuse caliectasis throughout the left kidney. 2. Multiple additional nonobstructive calculi in both renal collecting systems measuring up to 9 mm in the lower pole collecting system of the right kidney. 3. Morphologic changes in the pancreas compatible with chronic pancreatitis. Probable obstructing ductal calculus in the pancreatic head with diffuse pancreatic ductal dilatation. 4. Extensive clustered nodularity lying dependently in the left lower lobe. The appearance is suggestive of  infection/inflammation, potentially related to prior aspiration. However, close attention on follow-up noncontrast chest CT is recommended within 1 month to ensure the resolution of this finding, as neoplasm is not favored but difficult to entirely exclude. 5. Small bilateral pleural effusions lying dependently. 6. Aortic atherosclerosis, in addition to left main and 3 vessel coronary artery disease. Please note that although the presence of coronary artery calcium documents the presence of coronary artery disease, the severity of this disease and any potential stenosis cannot be assessed on this non-gated CT examination. Assessment for potential risk factor modification, dietary therapy or pharmacologic therapy may be warranted, if clinically indicated. Electronically Signed   By: Vinnie Langton M.D.   On: 10/27/2019 11:53    IMPRESSION/PLAN:  86.  54 year old male admitted to the hospital 10/21/2019 with hyperosmolar hyperglycemia with metabolic encephalopathy.  He developed diarrhea with acute on chronic anemia since his admission. Today stable hemoglobin 8.7.  Hematocrit 27.8.  MCV 93.3.  Platelet 189.  Iron 42. FOBT +.  No obvious upper or lower GI bleeding. -Repeat CBC in a.m. -Continue PPI -Diet as tolerated   2.  Elevated LFTs.  Today alk phos 364.  AST 201.  ALT 129.  Total bili 0.5. -Hepatic panel, PT/INR in am  -RUQ sonogram to assess for ascites  3.  History of alcoholic pancreatitis. CTAP 3/22 showed evidence of chronic pancreatitis with probable obstructing ductal calculus in the pancreatic head with diffuse pancreatic ductal dilatation. No evidence of intra or extrahepatic biliary ductal dilatation. Normal gallbladder. .   4.  Chronic diarrhea, was likely from pancreatic insufficiency.  Patient reported passing a solid brown-green stool earlier today. -Continue Creon   Noralyn Pick  10/28/2019, 2:20 PM  GI ATTENDING  History, laboratories, x-rays reviewed.  Multiple  prior endoscopy reports reviewed.  Patient seen and examined.  Agree with comprehensive  consultation note as outlined above. IMPRESSION: 1.  Type 2 diabetes out-of-control with hyperosmolar hyperglycemia and metabolic encephalopathy.  Being treated 2.  Anemia of chronic disease 3.  Chronic alcoholic pancreatitis with calcific change 4.  Diarrhea.  Suspect secondary to pancreatic insufficiency 5.  Elevated liver test.  No biliary dilation on imaging 6.  Multiple social issues RECOMMENDATIONS 1.  Treat principal presenting problem as you are doing currently 2.  Arrange for this gentleman to have some type of outpatient medical care or he will continue to return to the hospital with acute issues 3.  No alcohol forever 4.  Check hepatitis serologies if not done 5.  Regular pancreatic enzyme supplementation with Creon or the like with meals to assist with diarrhea and malnutrition No additional recommendations or plans.  Feel free to contact us for any questions.  We will sign off.  Docia Chuck. Geri Seminole., M.D. [facility] Division of Gastroenterology

## 2019-10-28 NOTE — Plan of Care (Signed)
  Problem: Health Behavior/Discharge Planning: Goal: Ability to manage health-related needs will improve Outcome: Progressing   Problem: Nutrition: Goal: Adequate nutrition will be maintained Outcome: Progressing   Problem: Coping: Goal: Level of anxiety will decrease Outcome: Progressing   Problem: Elimination: Goal: Will not experience complications related to bowel motility Outcome: Progressing Goal: Will not experience complications related to urinary retention Outcome: Progressing   

## 2019-10-28 NOTE — Progress Notes (Addendum)
PROGRESS NOTE    Rodney Barber  EVO:[id number] DOB: 03/11/1966 DOA: 10/21/2019 PCP: Patient, No Pcp Per  Brief Narrative: Rodney Barber is a 54 y.o.  homeless male with history h/o diabetes mellitus 2, hypertension, chronic pancreatitis, prior EtOH use, tobacco use, who resides at a shelter  presented with complaints of dizziness x2days, patient is a very poor historian, in the ED he was noted to have a blood sugar of 947 with a bicarb of 28, he was also noted to be hypothermic on admission and had peripheral edema. -Admitted with hyperosmolar nonketotic hyperglycemia, -Hospitalization complicated by ongoing diarrhea and anemia -CT with chronic changes, hemoglobin dropped to 7.3, heme positive stools, transfused 1 unit of PRBC  Assessment & Plan:  Hyperosmolar hyperglycemic state  Admitted with blood glucose of 299 with metabolic encephalopathy: -Transitioned off insulin drip, continue Lantus -CBGs have been running really high, patient eating and drinking large amounts of meals, snacks, drinks -Continue Levemir, dose increased, NovoLog with meals -Patient is homeless, lives at [facility] with limited access to healthcare, case management consulted and following, will need a prescription for glucometer, insulin and supplies at discharge  Acute diastolic CHF Hypoalbuminemia, likely chronic liver disease -Suspect his longstanding lower extremity edema is likely combination of diastolic dysfunction and chronic hypoalbuminemia with third spacing - 2D echocardiogram with preserved EF, diastolic dysfunction noted -Overall improving, appears close to euvolemic, edema has resolved -IV Lasix discontinued -Transitioned to oral Lasix, continue this daily -patient likely has liver disease from long standing ETOH use,   Acute on chronic anemia -Baseline hemoglobin around 9-10 range, hemoglobin dropped to 7.3 yesterday -No overt bleeding -Anemia panel suggestive of chronic disease -CT  abdomen pelvis, without overt masses or malignancy -Transfused 1 unit of PRBC today yesterday -He is Hemoccult positive, will request gastroenterology input as patient is homeless less limits his ability to have outpatient work  Elevated AST/ALT/Alkaline phosphatase- -appears to be recurrent and slightly worse from prior  -some component of AST/ALT is likely due to ETOH liver dz, but wouldn't explain high ALP -CT without acute findings -needs GI follow-up for this   Chronic alcoholic pancreatitis: -Symptoms at this time  Chronic diarrhea -Could be secondary to malabsorption from chronic pancreatitis -Continue Creon, Imodium as needed -Appears improved at this time  Hyponatremia:  -Likely pseudohyponatremia in the setting of significant hyperglycemia -Resolved  Underweight/Severe malnutrition:Body mass index is 14.85 kg/m -RD consult, increase protein supplements advised  Essential hypertension: Resume home medication-lisinopril.   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    Tobacco use:  Counseled, nicotine patch  Medication noncompliance: Counseled extensively as discussed above.  Diabetes educator to follow-up.  DVT prophylaxis: Lovenox  Code Status: DNR per patients wishes Patient/Family Communication:  No family at bedside, discussed care with patient Disposition: Back to shelter pending anemia workup  Consults called: Diabetes educator Principal Problem:     Procedures:   Antimicrobials:    Subjective: -feels ok, no new complaints  Objective: Vitals:   10/28/19 0604 10/28/19 0835 10/28/19 1007 10/28/19 1249  BP: (!) 142/75 140/78 136/82 135/87  Pulse: 66 66  67  Resp: 16 (!) 24  20  Temp: 98.2 F (36.8 C) 98 F (36.7 C)  98 F (36.7 C)  TempSrc: Oral Oral  Oral  SpO2: 99% 100%  100%  Weight:      Height:        Intake/Output Summary (Last 24 hours) at 10/28/2019  1251 Last data  filed at 10/28/2019 0617 Gross per 24 hour  Intake 1975 ml  Output --  Net 1975 ml   Filed Weights   10/22/19 1400  Weight: 49.2 kg    Examination:  Gen: Cachectic frail chronically ill-appearing African-American male sitting up in bed, AAOx2, pleasant HEENT: PERRLA, Neck supple, no JVD Lungs: Decreased breath sounds at bases CVS: S1-S2, regular rate rhythm Abd: Soft, mildly distended, bowel sounds present, nontender Extremities: No edema, chronic skin changes Skin: no new rashes on exposed skin  Psychiatry: Poor insight and judgment    Data Reviewed:   CBC: Recent Labs  Lab 10/21/19 1345 10/23/19 0524 10/25/19 1324 10/26/19 0504 10/26/19 0822 10/27/19 0453 10/28/19 0450  WBC 8.4   < > 8.1 7.8 7.6 7.3 7.9  NEUTROABS 7.0  --   --   --   --   --   --   HGB 11.1*   < > 9.2* 7.6* 8.5* 7.3* 8.7*  HCT 35.8*   < > 28.9* 24.6* 29.8* 23.6* 27.8*  MCV 96.0   < > 95.4 95.3 105.7* 97.9 93.3  PLT 254   < > 200 182 169 190 189   < > = values in this interval not displayed.   Basic Metabolic Panel: Recent Labs  Lab 10/21/19 1750 10/22/19 0150 10/24/19 0446 10/25/19 0427 10/26/19 0504 10/27/19 0453 10/28/19 0450  NA 138   < > 138 136 136 137 134*  K 3.6   < > 4.5 4.4 4.2 4.5 4.0  CL 104   < > 104 103 101 105 105  CO2 28   < > 29 29 29 25 24   GLUCOSE 519*   < > 195* 186* 295* 313* 195*  BUN 14   < > 18 21* 21* 21* 21*  CREATININE 0.69   < > 0.47* 0.52* 0.53* 0.64 0.56*  CALCIUM 8.0*   < > 8.1* 8.0* 7.9* 8.0* 7.7*  MG 1.7  --   --   --   --   --   --    < > = values in this interval not displayed.   GFR: Estimated Creatinine Clearance: 72.6 mL/min (A) (by C-G formula based on SCr of 0.56 mg/dL (L)). Liver Function Tests: Recent Labs  Lab 10/21/19 1345 10/23/19 0524 10/27/19 0453 10/28/19 0450  AST 113* 105* 305* 201*  ALT 143* 89* 138* 129*  ALKPHOS 722* 505* 393* 364*  BILITOT 0.6 0.4 0.5 0.5  PROT 6.2* 4.7* 4.8* 4.7*  ALBUMIN 2.9* 2.1*  2.1* 2.1*   No results for input(s): LIPASE, AMYLASE in the last 168 hours. No results for input(s): AMMONIA in the last 168 hours. Coagulation Profile: No results for input(s): INR, PROTIME in the last 168 hours. Cardiac Enzymes: No results for input(s): CKTOTAL, CKMB, CKMBINDEX, TROPONINI in the last 168 hours. BNP (last 3 results) No results for input(s): PROBNP in the last 8760 hours. HbA1C: No results for input(s): HGBA1C in the last 72 hours. CBG: Recent Labs  Lab 10/27/19 1130 10/27/19 1733 10/27/19 2224 10/28/19 0747 10/28/19 1146  GLUCAP 267* 291* 286* 179* 247*   Lipid Profile: No results for input(s): CHOL, HDL, LDLCALC, TRIG, CHOLHDL, LDLDIRECT in the last 72 hours. Thyroid Function Tests: No results for input(s): TSH, T4TOTAL, FREET4, T3FREE, THYROIDAB in the last 72 hours. Anemia Panel: Recent Labs    10/25/19 1324  VITAMINB12 853  FOLATE 55.9  FERRITIN 287  TIBC 186*  IRON 42*  RETICCTPCT 1.9   Urine analysis:  Component Value Date/Time   COLORURINE YELLOW 10/21/2019 1346   APPEARANCEUR CLEAR 10/21/2019 1346   LABSPEC 1.026 10/21/2019 1346   PHURINE 6.0 10/21/2019 1346   GLUCOSEU >=500 (A) 10/21/2019 1346   HGBUR NEGATIVE 10/21/2019 1346   BILIRUBINUR NEGATIVE 10/21/2019 1346   BILIRUBINUR negative 03/15/2018 1049   BILIRUBINUR negative 12/26/2017 0941   KETONESUR NEGATIVE 10/21/2019 1346   PROTEINUR 30 (A) 10/21/2019 1346   UROBILINOGEN 0.2 03/15/2018 1049   UROBILINOGEN 0.2 12/08/2010 1613   NITRITE NEGATIVE 10/21/2019 1346   LEUKOCYTESUR SMALL (A) 10/21/2019 1346   Sepsis Labs: @LABRCNTIP (procalcitonin:4,lacticidven:4)  ) Recent Results (from the past 240 hour(s))  Culture, Urine     Status: Abnormal   Collection Time: 10/21/19  1:46 PM   Specimen: Urine, Random  Result Value Ref Range Status   Specimen Description   Final    URINE, RANDOM Performed at [facility], [address] [place], [place] [address]     Special Requests   Final    NONE Performed at [facility], [address], [place], [place] [address]    Culture >=100,000 COLONIES/mL STAPHYLOCOCCUS AUREUS (A)  Final   Report Status 10/24/2019 FINAL  Final   Organism ID, Bacteria STAPHYLOCOCCUS AUREUS (A)  Final      Susceptibility   Staphylococcus aureus - MIC*    CIPROFLOXACIN <=0.5 SENSITIVE Sensitive     GENTAMICIN <=0.5 SENSITIVE Sensitive     NITROFURANTOIN <=16 SENSITIVE Sensitive     OXACILLIN <=0.25 SENSITIVE Sensitive     TETRACYCLINE <=1 SENSITIVE Sensitive     VANCOMYCIN <=0.5 SENSITIVE Sensitive     TRIMETH/SULFA <=10 SENSITIVE Sensitive     CLINDAMYCIN <=0.25 SENSITIVE Sensitive     RIFAMPIN <=0.5 SENSITIVE Sensitive     Inducible Clindamycin NEGATIVE Sensitive     * >=100,000 COLONIES/mL STAPHYLOCOCCUS AUREUS  Culture, blood (routine x 2)     Status: None   Collection Time: 10/21/19  3:30 PM   Specimen: BLOOD  Result Value Ref Range Status   Specimen Description   Final    BLOOD LEFT ANTECUBITAL Performed at [facility], [address], [place], [place] [address]    Special Requests   Final    BOTTLES DRAWN AEROBIC ONLY Blood Culture adequate volume Performed at [facility], [address], [place], [place] [address]    Culture   Final    NO GROWTH 5 DAYS Performed at [facility], [address] [place], [place] [address]    Report Status 10/26/2019 FINAL  Final  Culture, blood (routine x 2)     Status: None   Collection Time: 10/21/19  3:30 PM   Specimen: BLOOD LEFT FOREARM  Result Value Ref Range Status   Specimen Description   Final    BLOOD LEFT FOREARM Performed at [facility], [address] [place], [place] [address]    Special Requests   Final    BOTTLES DRAWN AEROBIC ONLY Blood Culture results may not be optimal due to an inadequate volume of blood received in culture bottles Performed at [facility], [address] [place], [place] [address]    Culture   Final    NO GROWTH 5 DAYS Performed at [facility], [address] [place], [place] [address]    Report Status 10/26/2019 FINAL  Final  SARS CORONAVIRUS 2 (TAT 6-24 HRS) Nasopharyngeal Nasopharyngeal Swab     Status: None  Collection Time: 10/21/19  4:06 PM   Specimen: Nasopharyngeal Swab  Result Value Ref Range Status   SARS Coronavirus 2 NEGATIVE NEGATIVE Final    Comment: (NOTE) SARS-CoV-2 target nucleic acids are NOT DETECTED. The SARS-CoV-2 RNA is generally detectable in upper and lower respiratory specimens during the acute phase of infection. Negative results do not preclude SARS-CoV-2 infection, do not rule out co-infections with other pathogens, and should not be used as the sole basis for treatment or other patient management decisions. Negative results must be combined with clinical observations, patient history, and epidemiological information. The expected result is Negative. Fact Sheet for Patients: HairSlick.no Fact Sheet for Healthcare Providers: quierodirigir.com This test is not yet approved or cleared by the [place] FDA and  has been authorized for detection and/or diagnosis of SARS-CoV-2 by FDA under an Emergency Use Authorization (EUA). This EUA will remain  in effect (meaning this test can be used) for the duration of the COVID-19 declaration under Section 56 4(b)(1) of the Act, 21 U.S.C. section 360bbb-3(b)(1), unless the authorization is terminated or revoked sooner. Performed at [facility], [address], [place], [place] [address]          Radiology Studies: CT ABDOMEN PELVIS W CONTRAST  Result Date: 10/27/2019 CLINICAL DATA:  54 year old male with history of worsening in the media and weight loss. EXAM: CT ABDOMEN AND PELVIS WITH CONTRAST TECHNIQUE: Multidetector CT imaging of the abdomen and  pelvis was performed using the standard protocol following bolus administration of intravenous contrast. CONTRAST:  OMNIPAQUE IOHEXOL 300 MG/ML  SOLN COMPARISON:  CT the abdomen and pelvis 07/10/2013. FINDINGS: Lower chest: Small bilateral pleural effusions lying dependently with some associated passive subsegmental atelectasis in the lower lobes of the lungs bilaterally. Extensive nodularity noted in the posterior aspect of the left lower lobe (axial image 1 of series 7) measuring 1.9 x 1.5 cm. Atherosclerotic calcifications in the left main, left anterior descending, left circumflex and right coronary arteries. Hepatobiliary: No suspicious cystic or solid hepatic lesions. No intra or extrahepatic biliary ductal dilatation. Gallbladder is normal in appearance. Pancreas: Widespread coarse calcifications scattered throughout the pancreatic parenchyma, along with diffuse pancreatic atrophy, sequela of chronic pancreatitis. The largest of these calcifications are in the pancreatic head measuring up to 1.2 x 0.8 cm (axial image 31 of series 2), potentially within the duct. Diffuse ductal dilatation measuring up to 9 mm in the body of the pancreas. No peripancreatic fluid collections or inflammatory changes. Spleen: Unremarkable. Adrenals/Urinary Tract: There are multiple calculi in the collecting systems of both kidneys, most of which appear nonobstructive, measuring up to 9 mm in the lower pole collecting system of the right kidney. In addition, in the interpolar collecting system of the left kidney there is a large calculus measuring 1.0 x 1.8 x 1.2 cm which is associated with some caliectasis throughout the left renal collecting system, indicating mild obstruction. No hydroureter. Urinary bladder is normal in appearance. Bilateral adrenal glands are normal in appearance. Stomach/Bowel: Unenhanced appearance of the stomach is normal. No pathologic dilatation of small bowel or colon. Large volume of stool  throughout the colon and rectum may suggest constipation. Vascular/Lymphatic: Aortic atherosclerosis, without evidence of aneurysm or dissection in the abdominal or pelvic vasculature. No lymphadenopathy noted in the abdomen or pelvis. Reproductive: Prostate gland and seminal vesicles are unremarkable in appearance. Other: No significant volume of ascites.  No pneumoperitoneum. Musculoskeletal: There are no aggressive appearing lytic or blastic lesions noted in the visualized  portions of the skeleton. IMPRESSION: 1. 1.0 x 1.8 x 1.2 cm calculus in the interpolar collecting system of left kidney which appears mildly obstructive as evidenced by mild diffuse caliectasis throughout the left kidney. 2. Multiple additional nonobstructive calculi in both renal collecting systems measuring up to 9 mm in the lower pole collecting system of the right kidney. 3. Morphologic changes in the pancreas compatible with chronic pancreatitis. Probable obstructing ductal calculus in the pancreatic head with diffuse pancreatic ductal dilatation. 4. Extensive clustered nodularity lying dependently in the left lower lobe. The appearance is suggestive of infection/inflammation, potentially related to prior aspiration. However, close attention on follow-up noncontrast chest CT is recommended within 1 month to ensure the resolution of this finding, as neoplasm is not favored but difficult to entirely exclude. 5. Small bilateral pleural effusions lying dependently. 6. Aortic atherosclerosis, in addition to left main and 3 vessel coronary artery disease. Please note that although the presence of coronary artery calcium documents the presence of coronary artery disease, the severity of this disease and any potential stenosis cannot be assessed on this non-gated CT examination. Assessment for potential risk factor modification, dietary therapy or pharmacologic therapy may be warranted, if clinically indicated. Electronically Signed   By: Trudie Reed M.D.   On: 10/27/2019 11:53        Scheduled Meds: . atorvastatin  20 mg Oral q1800  . enoxaparin (LOVENOX) injection  40 mg Subcutaneous Q24H  . escitalopram  5 mg Oral Daily  . feeding supplement (GLUCERNA SHAKE)  237 mL Oral TID BM  . folic acid  1 mg Oral Daily  . furosemide  20 mg Oral Daily  . insulin aspart  0-5 Units Subcutaneous QHS  . insulin aspart  0-9 Units Subcutaneous TID WC  . insulin aspart  6 Units Subcutaneous TID WC  . insulin detemir  22 Units Subcutaneous Daily  . lipase/protease/amylase  12,000 Units Oral TID AC  . lisinopril  20 mg Oral Daily  . nicotine  14 mg Transdermal Daily   Continuous Infusions:    LOS: 7 days    Time spent:   Zannie Cove, MD [facility] 10/28/2019, 12:51 PM

## 2019-10-29 DIAGNOSIS — E1169 Type 2 diabetes mellitus with other specified complication: Secondary | ICD-10-CM

## 2019-10-29 LAB — COMPREHENSIVE METABOLIC PANEL
ALT: 113 U/L — ABNORMAL HIGH (ref 0–44)
AST: 158 U/L — ABNORMAL HIGH (ref 15–41)
Albumin: 2.2 g/dL — ABNORMAL LOW (ref 3.5–5.0)
Alkaline Phosphatase: 334 U/L — ABNORMAL HIGH (ref 38–126)
Anion gap: 6 (ref 5–15)
BUN: 19 mg/dL (ref 6–20)
CO2: 22 mmol/L (ref 22–32)
Calcium: 7.8 mg/dL — ABNORMAL LOW (ref 8.9–10.3)
Chloride: 107 mmol/L (ref 98–111)
Creatinine, Ser: 0.49 mg/dL — ABNORMAL LOW (ref 0.61–1.24)
GFR calc Af Amer: >60 mL/min (ref >60)
GFR calc non Af Amer: >60 mL/min (ref >60)
Glucose, Bld: 176 mg/dL — ABNORMAL HIGH (ref 70–99)
Potassium: 4.4 mmol/L (ref 3.5–5.1)
Sodium: 135 mmol/L (ref 135–145)
Total Bilirubin: 0.4 mg/dL (ref 0.3–1.2)
Total Protein: 4.8 g/dL — ABNORMAL LOW (ref 6.5–8.1)

## 2019-10-29 LAB — CBC
HCT: 27.7 % — ABNORMAL LOW (ref 39.0–52.0)
Hemoglobin: 8.7 g/dL — ABNORMAL LOW (ref 13.0–17.0)
MCH: 29.2 pg (ref 26.0–34.0)
MCHC: 31.4 g/dL (ref 30.0–36.0)
MCV: 93 fL (ref 80.0–100.0)
Platelets: 200 10*3/uL (ref 150–400)
RBC: 2.98 MIL/uL — ABNORMAL LOW (ref 4.22–5.81)
RDW: 17.2 % — ABNORMAL HIGH (ref 11.5–15.5)
WBC: 7.9 10*3/uL (ref 4.0–10.5)
nRBC: 0 % (ref 0.0–0.2)

## 2019-10-29 LAB — GLUCOSE, CAPILLARY
Glucose-Capillary: 181 mg/dL — ABNORMAL HIGH (ref 70–99)
Glucose-Capillary: 245 mg/dL — ABNORMAL HIGH (ref 70–99)

## 2019-10-29 MED ORDER — BLOOD GLUCOSE MONITOR KIT: PACK | 0 refills | Status: DC

## 2019-10-29 MED ORDER — PANCRELIPASE (LIP-PROT-AMYL) 12000-38000 UNITS PO CPEP: 12000.0000 [IU] | ORAL_CAPSULE | Freq: Three times a day (TID) | ORAL | 0 refills | Status: DC

## 2019-10-29 MED ORDER — INSULIN DETEMIR 100 UNIT/ML ~~LOC~~ SOLN: 22.0000 [IU] | Freq: Every day | SUBCUTANEOUS | 11 refills | Status: DC

## 2019-10-29 MED ORDER — TRUEPLUS LANCETS 28G MISC: 12 refills | Status: DC

## 2019-10-29 MED ORDER — LISINOPRIL 20 MG PO TABS: 20.0000 mg | ORAL_TABLET | Freq: Every day | ORAL | 0 refills | Status: DC

## 2019-10-29 MED ORDER — "INSULIN SYRINGE 31G X 5/16"" 0.3 ML MISC": 0 refills | Status: DC

## 2019-10-29 MED ORDER — FUROSEMIDE 20 MG PO TABS: 20.0000 mg | ORAL_TABLET | Freq: Every day | ORAL | 1 refills | Status: DC

## 2019-10-29 MED FILL — LEVEMIR 100 UNITS/ML VIAL: 100 | 28 days supply | Qty: 10 | Fill #0

## 2019-10-29 MED FILL — TRUE METRIX TEST STRIP: 25 days supply | Qty: 100 | Fill #0

## 2019-10-29 MED FILL — LISINOPRIL 20 MG TABLET: 20 | 30 days supply | Qty: 30 | Fill #0

## 2019-10-29 MED FILL — FUROSEMIDE 20 MG TABS: 20 | 30 days supply | Qty: 30 | Fill #0

## 2019-10-29 MED FILL — CREON DR 12,000 UNITS CAP: 12000 | 33 days supply | Qty: 100 | Fill #0

## 2019-10-29 MED FILL — TRUEplus LANCETS 28G MISC: 25 days supply | Qty: 100 | Fill #0

## 2019-10-29 MED FILL — !TRUE METRIX BLOOD GLUCOSE: 1 days supply | Qty: 1 | Fill #0

## 2019-10-29 MED FILL — TRUEPLUS SYR 0.5ML 31GX5/16: 31G X 5/16" | 25 days supply | Qty: 100 | Fill #0

## 2019-10-29 NOTE — TOC Transition Note (Signed)
Transition of Care [facility]) - CM/SW Discharge Note   Patient Details  Name: Rodney Barber MRN: [id number] Date of Birth: 11-05-1965  Transition of [facility] CM/SW Contact:  Lanier Clam, RN Phone Number: 10/29/2019, 12:25 PM   Clinical Narrative: Patient has used the [facility] program in Aug 2020,& 1x free fill in Nov 2020-unable to use again. CM re instated patient-& informed to patient the need to continue to work on his disability w/[employer], & the need to have access to funds for his medicines. Provided w/2 bus passes to get to the [facility] pharmacy,& to [facility],provided w/a note signed by MD for return back to [facility], also the [facility] program letter-informed of 1x use/$3 co pay for each [facility] pharmacy. No further CM needs.      Final next level of care: Homeless Shelter Barriers to Discharge: No Barriers Identified   Patient Goals and CMS Choice Patient states their goals for this hospitalization and ongoing recovery are:: get well      Discharge Placement                       Discharge Plan and Services   Discharge Planning Services: CM Consult            DME Arranged: [facility]                    Social Determinants of Health (SDOH) Interventions     Readmission Risk Interventions Readmission Risk Prevention Plan 10/27/2019 06/24/2019 02/26/2019  Transportation Screening Complete Complete Complete  Medication Review Oceanographer) Complete Complete Complete  PCP or Specialist appointment within 3-5 days of discharge - Complete Complete  HRI or Home Care Consult Complete Complete Complete  SW Recovery Care/Counseling Consult Complete Complete Complete  Palliative Care Screening Not Applicable Not Applicable Complete  Skilled Nursing Facility Not Applicable Not Applicable -  Some recent data might be hidden

## 2019-10-29 NOTE — Discharge Summary (Signed)
Physician Discharge Summary  Rodney Barber EXB:[id number] DOB: 1966/01/29 DOA: 10/21/2019  PCP: Patient, No Pcp Per  Admit date: 10/21/2019 Discharge date: 10/29/2019  Admitted From: Home Disposition:  Home  Recommendations for Outpatient Follow-up:  1. Follow up with PCP in 1-2 weeks, refer patient for colonoscopy as an outpatient. 2. Check a CBC in 4 weeks.   Home Health:No Equipment/Devices:None  Discharge Condition:Stable CODE STATUS:Full Diet recommendation: Heart Healthy   Brief/Interim Summary: 54 y.o. male homeless with past medical history of diabetes mellitus type 2 essential hypertension, chronic pancreatitis prior history of alcohol abuse who resides in a shelter was having dizziness for about 2 days prior to admission, in the ED he was noted to have a blood sugar of 947 and bicarbonate of 28 and hypothermic on physical exam he had peripheral edema.  Was admitted and treated for hyperosmolar nonketotic hyperglycemia, hospitalization was complicated by ongoing diarrhea and anemia  Discharge Diagnoses:  Principal Problem:   Diabetic hyperosmolar non-ketotic state ([place]) Active Problems:   Chronic pancreatitis ([place])   Severe malnutrition ([place])   Uncontrolled type 2 diabetes mellitus ([place])   Abnormal liver function tests   Hyperglycemia   Tobacco use   Non compliance with medical treatment Diabetic hyperosmolar nonketotic state: He was started on IV insulin, his hyperglycemia was likely due to noncompliance with his medication.  Once his blood glucose was stabilized, he was transitioned to long-acting insulin plus sliding scale we will continue this regimen as an outpatient. [facility]  Acute diastolic heart failure/hypoalbuminemia in the setting of chronic liver disease: A 2D echo was done that showed diastolic dysfunction. He was started on IV Lasix and his lower extremity edema resolved. He was changed to oral Lasix which she  will continue as an outpatient.  Acute on chronic anemia: With a baseline hemoglobin of 9-10 dropped to 7.3 there were no signs of overt bleeding CT scan of the abdomen and pelvis showed no acute findings he was transfused 1 unit of packed red blood cells. GI was consulted who recommended follow-up as an outpatient.  Elevated AST and ALT: Likely due to alcohol abuse will need further work-up as an out patient as per recommended per GI.  Diarrhea due to chronic alcoholic pancreatitis: He was started on oral Creon now which improve.  Mild pseudohyponatremia: Likely due to hyperglycemia resolved.  Severe protein caloric malnutrition: Continue protein supplementation.  Essential hypertension: Norvasc was discontinued due to his lower extremity edema he was continue on low-dose lisinopril started on Lasix and his blood pressure is fairly controlled will need follow-up as an outpatient.  Medication noncompliance: He was counseled extensively.   Discharge Instructions  Discharge Instructions    Diet - low sodium heart healthy   Complete by: As directed    Increase activity slowly   Complete by: As directed      Allergies as of 10/29/2019   No Known Allergies     Medication List    STOP taking these medications   True Metrix Blood Glucose Test test strip Generic drug: glucose blood     TAKE these medications   amLODipine 5 MG tablet Commonly known as: NORVASC Take 1 tablet (5 mg total) by mouth daily.   atorvastatin 20 MG tablet Commonly known as: LIPITOR Take 1 tablet (20 mg total) by mouth daily at 6 PM.   blood glucose meter kit and supplies Kit Dispense based on patient and insurance preference. Use up to four times daily  as directed. (FOR ICD-9 250.00, 250.01).   escitalopram 10 MG tablet Commonly known as: Lexapro Take 0.5 tablets (5 mg total) by mouth daily.   feeding supplement (GLUCERNA SHAKE) Liqd Take 237 mLs by mouth 3 (three) times daily between  meals.   folic acid 1 MG tablet Commonly known as: FOLVITE Take 1 tablet (1 mg total) by mouth daily.   furosemide 20 MG tablet Commonly known as: LASIX Take 1 tablet (20 mg total) by mouth daily. Start taking on: October 30, 2019   insulin detemir 100 UNIT/ML injection Commonly known as: LEVEMIR Inject 0.22 mLs (22 Units total) into the skin daily. Start taking on: October 30, 2019 What changed:   how much to take  when to take this   INSULIN SYRINGE .3CC/31GX5/16" 31G X 5/16" 0.3 ML Misc Draw 8 units of Levemir with syringe twice per day   lactulose 10 GM/15ML solution Commonly known as: CHRONULAC Take 30 mLs (20 g total) by mouth 2 (two) times daily.   lipase/protease/amylase 12000 units Cpep capsule Commonly known as: CREON Take 1 capsule (12,000 Units total) by mouth 3 (three) times daily before meals.   lisinopril 20 MG tablet Commonly known as: ZESTRIL Take 1 tablet (20 mg total) by mouth daily.   nicotine 14 mg/24hr patch Commonly known as: NICODERM CQ - dosed in mg/24 hours Place 1 patch (14 mg total) onto the skin daily.   TRUEplus Lancets 28G Misc uad   TRUEresult Blood Glucose w/Device Kit uad       No Known Allergies  Consultations:  Gastroenterology   Procedures/Studies: CT Head Wo Contrast  Result Date: 10/21/2019 CLINICAL DATA:  Altered mental status. Dizziness. EXAM: CT HEAD WITHOUT CONTRAST TECHNIQUE: Contiguous axial images were obtained from the base of the skull through the vertex without intravenous contrast. COMPARISON:  February 21, 2019. FINDINGS: Brain: Small bilateral convexity CSF-density hygromas that are new since July 2020 and measure up to 7 mm thickness in the axial plane. No acute intracranial hemorrhage, midline shift, herniation, cerebral edema or mass effect. 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 Vascular: Benign dural calcification, likely calcified lipomatosis along the anterior falx cerebri,  unchanged. No dense vessel sign. Calcified atherosclerosis of the bilateral carotid siphons. Skull: Diffuse demineralization. A couple benign-appearing calvarial hemangiomas measuring up to 8 mm in the right parietal bone. Stable 7 mm sclerotic lesion in the right posterior parietal bone compared to July 2020. A 4 mm left frontal sinus benign osteoma. No skull fracture. Sinuses/Orbits: Mild bilateral maxillary and left ethmoidal mucosal thickening. Normal bilateral orbital soft tissues. Other: Chronic nasal bone fracture deformities. Asymmetrical thickening and soft tissue calcification of the right ear, including the helix and ear lobe, not significantly changed in size or contours. Pneumatized bilateral mastoid air cells and middle ears. IMPRESSION: No CT evidence of acute intracranial hemorrhage or edema. 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 Interval development of small bilateral convexity CSF-density hygromas, new since July 2020 and measuring up to 7 mm in thickness. Asymmetrical thickening and soft tissue calcification of the right ear, not significantly changed in size or contours compared to July 2020, and possibly benign or malignant. Dermatology consultation could be considered. Mild bilateral ethmoid and maxillary paranasal sinus mucosal disease. Electronically Signed   By: Revonda Humphrey   On: 10/21/2019 16:59   CT ABDOMEN PELVIS W CONTRAST  Result Date: 10/27/2019 CLINICAL DATA:  54 year old male with history of worsening in the media and weight  loss. EXAM: CT ABDOMEN AND PELVIS WITH CONTRAST TECHNIQUE: Multidetector CT imaging of the abdomen and pelvis was performed using the standard protocol following bolus administration of intravenous contrast. CONTRAST:  127m OMNIPAQUE IOHEXOL 300 MG/ML  SOLN COMPARISON:  CT the abdomen and pelvis 07/10/2013. FINDINGS: Lower chest: Small bilateral pleural effusions lying dependently with some associated passive subsegmental  atelectasis in the lower lobes of the lungs bilaterally. Extensive nodularity noted in the posterior aspect of the left lower lobe (axial image 1 of series 7) measuring 1.9 x 1.5 cm. Atherosclerotic calcifications in the left main, left anterior descending, left circumflex and right coronary arteries. Hepatobiliary: No suspicious cystic or solid hepatic lesions. No intra or extrahepatic biliary ductal dilatation. Gallbladder is normal in appearance. Pancreas: Widespread coarse calcifications scattered throughout the pancreatic parenchyma, along with diffuse pancreatic atrophy, sequela of chronic pancreatitis. The largest of these calcifications are in the pancreatic head measuring up to 1.2 x 0.8 cm (axial image 31 of series 2), potentially within the duct. Diffuse ductal dilatation measuring up to 9 mm in the body of the pancreas. No peripancreatic fluid collections or inflammatory changes. Spleen: Unremarkable. Adrenals/Urinary Tract: There are multiple calculi in the collecting systems of both kidneys, most of which appear nonobstructive, measuring up to 9 mm in the lower pole collecting system of the right kidney. In addition, in the interpolar collecting system of the left kidney there is a large calculus measuring 1.0 x 1.8 x 1.2 cm which is associated with some caliectasis throughout the left renal collecting system, indicating mild obstruction. No hydroureter. Urinary bladder is normal in appearance. Bilateral adrenal glands are normal in appearance. Stomach/Bowel: Unenhanced appearance of the stomach is normal. No pathologic dilatation of small bowel or colon. Large volume of stool throughout the colon and rectum may suggest constipation. Vascular/Lymphatic: Aortic atherosclerosis, without evidence of aneurysm or dissection in the abdominal or pelvic vasculature. No lymphadenopathy noted in the abdomen or pelvis. Reproductive: Prostate gland and seminal vesicles are unremarkable in appearance. Other: No  significant volume of ascites.  No pneumoperitoneum. Musculoskeletal: There are no aggressive appearing lytic or blastic lesions noted in the visualized portions of the skeleton. IMPRESSION: 1. 1.0 x 1.8 x 1.2 cm calculus in the interpolar collecting system of left kidney which appears mildly obstructive as evidenced by mild diffuse caliectasis throughout the left kidney. 2. Multiple additional nonobstructive calculi in both renal collecting systems measuring up to 9 mm in the lower pole collecting system of the right kidney. 3. Morphologic changes in the pancreas compatible with chronic pancreatitis. Probable obstructing ductal calculus in the pancreatic head with diffuse pancreatic ductal dilatation. 4. Extensive clustered nodularity lying dependently in the left lower lobe. The appearance is suggestive of infection/inflammation, potentially related to prior aspiration. However, close attention on follow-up noncontrast chest CT is recommended within 1 month to ensure the resolution of this finding, as neoplasm is not favored but difficult to entirely exclude. 5. Small bilateral pleural effusions lying dependently. 6. Aortic atherosclerosis, in addition to left main and 3 vessel coronary artery disease. Please note that although the presence of coronary artery calcium documents the presence of coronary artery disease, the severity of this disease and any potential stenosis cannot be assessed on this non-gated CT examination. Assessment for potential risk factor modification, dietary therapy or pharmacologic therapy may be warranted, if clinically indicated. Electronically Signed   By: DVinnie LangtonM.D.   On: 10/27/2019 11:53   DG Chest Portable 1 View  Result Date: 10/21/2019 CLINICAL  DATA:  Weakness, dizziness and hyperglycemia. EXAM: PORTABLE CHEST 1 VIEW COMPARISON:  02/19/2019 FINDINGS: The heart size and mediastinal contours are within normal limits. Both lungs are clear. The visualized skeletal  structures are unremarkable. IMPRESSION: No active disease. Electronically Signed   By: Nelson Chimes M.D.   On: 10/21/2019 14:52   ECHOCARDIOGRAM COMPLETE  Result Date: 10/22/2019    ECHOCARDIOGRAM REPORT   Patient Name:   Rodney Barber Date of Exam: 10/22/2019 Medical Rec #:  [id number]        Height:       68.0 in Accession #:    [id number]       Weight:       97.7 lb Date of Birth:  Nov 24, 1965        BSA:          1.507 m Patient Age:    54 years         BP:           142/86 mmHg Patient Gender: M                HR:           67 bpm. Exam Location:  Inpatient Procedure: 2D Echo Indications:    CHF-Acute Diastolic 297.98 / X21.19  History:        Patient has prior history of Echocardiogram examinations, most                 recent 05/20/2019. Risk Factors:Tobacco use and Diabetes. Acute                 kidney injury                 Substance use disorder                 Alcohol use.  Sonographer:    Vikki Ports Turrentine Referring Phys: [id number] [facility]  1. Left ventricular ejection fraction, by estimation, is 40 to 45%. The left ventricle has mildly decreased function. The left ventricle demonstrates global hypokinesis. Left ventricular diastolic parameters are consistent with Grade I diastolic dysfunction (impaired relaxation).  2. Right ventricular systolic function is normal. The right ventricular size is normal. Tricuspid regurgitation signal is inadequate for assessing PA pressure.  3. The mitral valve is normal in structure. No evidence of mitral valve regurgitation.  4. The aortic valve is tricuspid. Aortic valve regurgitation is trivial. No aortic stenosis is present. There is a small filamentous mobile echodensity on ventricular side of right coronary cusp, unchanged from prior TTE on 05/15/19  5. The inferior vena cava is normal in size with <50% respiratory variability, suggesting right atrial pressure of 8 mmHg. FINDINGS  Left Ventricle: Left ventricular ejection fraction, by  estimation, is 40 to 45%. The left ventricle has mildly decreased function. The left ventricle demonstrates global hypokinesis. The left ventricular internal cavity size was normal in size. There is  no left ventricular hypertrophy. Left ventricular diastolic parameters are consistent with Grade I diastolic dysfunction (impaired relaxation). Right Ventricle: The right ventricular size is normal. No increase in right ventricular wall thickness. Right ventricular systolic function is normal. Tricuspid regurgitation signal is inadequate for assessing PA pressure. Left Atrium: Left atrial size was normal in size. Right Atrium: Right atrial size was normal in size. Prominent Chiari network. Pericardium: A small pericardial effusion is present. Mitral Valve: The mitral valve is normal in structure. No evidence of mitral valve regurgitation. Tricuspid Valve: The tricuspid valve is  normal in structure. Tricuspid valve regurgitation is trivial. Aortic Valve: The aortic valve is tricuspid. Aortic valve regurgitation is trivial. No aortic stenosis is present. Pulmonic Valve: The pulmonic valve was not well visualized. Pulmonic valve regurgitation is not visualized. Aorta: The aortic root is normal in size and structure. Venous: The inferior vena cava is normal in size with less than 50% respiratory variability, suggesting right atrial pressure of 8 mmHg. IAS/Shunts: No atrial level shunt detected by color flow Doppler.  LEFT VENTRICLE PLAX 2D LVIDd:         4.50 cm     Diastology LVIDs:         3.90 cm     LV e' lateral:   7.28 cm/s LV PW:         0.80 cm     LV E/e' lateral: 7.2 LV IVS:        0.80 cm     LV e' medial:    5.03 cm/s LVOT diam:     2.00 cm     LV E/e' medial:  10.5 LV SV:         55 LV SV Index:   36 LVOT Area:     3.14 cm  LV Volumes (MOD) LV vol d, MOD A2C: 60.6 ml LV vol d, MOD A4C: 82.8 ml LV vol s, MOD A2C: 34.2 ml LV vol s, MOD A4C: 45.9 ml LV SV MOD A2C:     26.4 ml LV SV MOD A4C:     82.8 ml LV SV MOD  BP:      31.9 ml RIGHT VENTRICLE RV S prime:     8.24 cm/s TAPSE (M-mode): 2.0 cm LEFT ATRIUM           Index       RIGHT ATRIUM           Index LA diam:      3.00 cm 1.99 cm/m  RA Area:     10.90 cm LA Vol (A4C): 22.8 ml 15.13 ml/m RA Volume:   22.60 ml  15.00 ml/m  AORTIC VALVE LVOT Vmax:   73.80 cm/s LVOT Vmean:  55.500 cm/s LVOT VTI:    0.174 m  AORTA Ao Root diam: 3.10 cm MITRAL VALVE MV Area (PHT): 3.03 cm    SHUNTS MV Decel Time: 250 msec    Systemic VTI:  0.17 m MV E velocity: 52.70 cm/s  Systemic Diam: 2.00 cm MV A velocity: 69.80 cm/s MV E/A ratio:  0.76 Oswaldo Milian MD Electronically signed by Oswaldo Milian MD Signature Date/Time: 10/22/2019/3:41:34 PM    Final      Subjective: No complains  Discharge Exam: Vitals:   10/28/19 2046 10/29/19 0425  BP: (!) 141/78 (!) 145/77  Pulse: 72 76  Resp: 16 16  Temp: 98.2 F (36.8 C) 98.4 F (36.9 C)  SpO2: 100% 98%   Vitals:   10/28/19 1007 10/28/19 1249 10/28/19 2046 10/29/19 0425  BP: 136/82 135/87 (!) 141/78 (!) 145/77  Pulse:  67 72 76  Resp:  20 16 16   Temp:  98 F (36.7 C) 98.2 F (36.8 C) 98.4 F (36.9 C)  TempSrc:  Oral Oral Oral  SpO2:  100% 100% 98%  Weight:      Height:        General: Pt is alert, awake, not in acute distress Cardiovascular: RRR, S1/S2 +, no rubs, no gallops Respiratory: CTA bilaterally, no wheezing, no rhonchi Abdominal: Soft, NT, ND, bowel sounds + Extremities: no  edema, no cyanosis    The results of significant diagnostics from this hospitalization (including imaging, microbiology, ancillary and laboratory) are listed below for reference.     Microbiology: Recent Results (from the past 240 hour(s))  Culture, Urine     Status: Abnormal   Collection Time: 10/21/19  1:46 PM   Specimen: Urine, Random  Result Value Ref Range Status   Specimen Description   Final    URINE, RANDOM Performed at [facility] [address], [place], [place] [address]     Special Requests   Final    NONE Performed at [facility], [place] [address], [place], [place] [address]    Culture >=100,000 COLONIES/mL STAPHYLOCOCCUS AUREUS (A)  Final   Report Status 10/24/2019 FINAL  Final   Organism ID, Bacteria STAPHYLOCOCCUS AUREUS (A)  Final      Susceptibility   Staphylococcus aureus - MIC*    CIPROFLOXACIN <=0.5 SENSITIVE Sensitive     GENTAMICIN <=0.5 SENSITIVE Sensitive     NITROFURANTOIN <=16 SENSITIVE Sensitive     OXACILLIN <=0.25 SENSITIVE Sensitive     TETRACYCLINE <=1 SENSITIVE Sensitive     VANCOMYCIN <=0.5 SENSITIVE Sensitive     TRIMETH/SULFA <=10 SENSITIVE Sensitive     CLINDAMYCIN <=0.25 SENSITIVE Sensitive     RIFAMPIN <=0.5 SENSITIVE Sensitive     Inducible Clindamycin NEGATIVE Sensitive     * >=100,000 COLONIES/mL STAPHYLOCOCCUS AUREUS  Culture, blood (routine x 2)     Status: None   Collection Time: 10/21/19  3:30 PM   Specimen: BLOOD  Result Value Ref Range Status   Specimen Description   Final    BLOOD LEFT ANTECUBITAL Performed at [facility] [address] [place], [place] [address]    Special Requests   Final    BOTTLES DRAWN AEROBIC ONLY Blood Culture adequate volume Performed at [facility] [address] [place], [place] [address]    Culture   Final    NO GROWTH 5 DAYS Performed at [facility], [place] [address] [place], [place] [address]    Report Status 10/26/2019 FINAL  Final  Culture, blood (routine x 2)     Status: None   Collection Time: 10/21/19  3:30 PM   Specimen: BLOOD LEFT FOREARM  Result Value Ref Range Status   Specimen Description   Final    BLOOD LEFT FOREARM Performed at [facility] [address] [place], [place] [address]    Special Requests   Final    BOTTLES DRAWN AEROBIC ONLY Blood Culture results may not be optimal due to an inadequate volume of blood received in culture bottles Performed at [facility] [address] [place], [place] [address]    Culture   Final    NO GROWTH 5 DAYS Performed at [facility], [place] [address] [place],  [address]    Report Status 10/26/2019 FINAL  Final  SARS CORONAVIRUS 2 (TAT 6-24 HRS) Nasopharyngeal Nasopharyngeal Swab     Status: None   Collection Time: 10/21/19  4:06 PM   Specimen: Nasopharyngeal Swab  Result Value Ref Range Status   SARS Coronavirus 2 NEGATIVE NEGATIVE Final    Comment: (NOTE) SARS-CoV-2 target nucleic acids are NOT DETECTED. The SARS-CoV-2 RNA is generally detectable in upper and lower respiratory specimens during the acute phase of infection. Negative results do not preclude SARS-CoV-2 infection, do not rule out co-infections with other pathogens, and should  not be used as the sole basis for treatment or other patient management decisions. Negative results must be combined with clinical observations, patient history, and epidemiological information. The expected result is Negative. Fact Sheet for Patients: SugarRoll.be Fact Sheet for Healthcare Providers: https://www.woods-mathews.com/ This test is not yet approved or cleared by the [place] FDA and  has been authorized for detection and/or diagnosis of SARS-CoV-2 by FDA under an Emergency Use Authorization (EUA). This EUA will remain  in effect (meaning this test can be used) for the duration of the COVID-19 declaration under Section 56 4(b)(1) of the Act, 21 U.S.C. section 360bbb-3(b)(1), unless the authorization is terminated or revoked sooner. Performed at [facility], [place] [address] [place], [place] [address]      Labs: BNP (last 3 results) Recent Labs    10/21/19 1434  BNP 203.5*   Basic Metabolic Panel: Recent Labs  Lab 10/25/19 0427 10/26/19 0504 10/27/19 0453 10/28/19 0450 10/29/19 0504  NA 136 136 137 134* 135  K 4.4 4.2 4.5 4.0 4.4  CL 103 101 105 105 107   CO2 29 29 25 24 22   GLUCOSE 186* 295* 313* 195* 176*  BUN 21* 21* 21* 21* 19  CREATININE 0.52* 0.53* 0.64 0.56* 0.49*  CALCIUM 8.0* 7.9* 8.0* 7.7* 7.8*   Liver Function Tests: Recent Labs  Lab 10/23/19 0524 10/27/19 0453 10/28/19 0450 10/29/19 0504  AST 105* 305* 201* 158*  ALT 89* 138* 129* 113*  ALKPHOS 505* 393* 364* 334*  BILITOT 0.4 0.5 0.5 0.4  PROT 4.7* 4.8* 4.7* 4.8*  ALBUMIN 2.1* 2.1* 2.1* 2.2*   No results for input(s): LIPASE, AMYLASE in the last 168 hours. No results for input(s): AMMONIA in the last 168 hours. CBC: Recent Labs  Lab 10/26/19 0504 10/26/19 0822 10/27/19 0453 10/28/19 0450 10/29/19 0504  WBC 7.8 7.6 7.3 7.9 7.9  HGB 7.6* 8.5* 7.3* 8.7* 8.7*  HCT 24.6* 29.8* 23.6* 27.8* 27.7*  MCV 95.3 105.7* 97.9 93.3 93.0  PLT 182 169 190 189 200   Cardiac Enzymes: No results for input(s): CKTOTAL, CKMB, CKMBINDEX, TROPONINI in the last 168 hours. BNP: Invalid input(s): POCBNP CBG: Recent Labs  Lab 10/28/19 0747 10/28/19 1146 10/28/19 1724 10/28/19 2046 10/29/19 0757  GLUCAP 179* 247* 122* 181* 181*   D-Dimer No results for input(s): DDIMER in the last 72 hours. Hgb A1c No results for input(s): HGBA1C in the last 72 hours. Lipid Profile No results for input(s): CHOL, HDL, LDLCALC, TRIG, CHOLHDL, LDLDIRECT in the last 72 hours. Thyroid function studies No results for input(s): TSH, T4TOTAL, T3FREE, THYROIDAB in the last 72 hours.  Invalid input(s): FREET3 Anemia work up No results for input(s): VITAMINB12, FOLATE, FERRITIN, TIBC, IRON, RETICCTPCT in the last 72 hours. Urinalysis    Component Value Date/Time   COLORURINE YELLOW 10/21/2019 1346   APPEARANCEUR CLEAR 10/21/2019 1346   LABSPEC 1.026 10/21/2019 1346   PHURINE 6.0 10/21/2019 1346   GLUCOSEU >=500 (A) 10/21/2019 1346   HGBUR NEGATIVE 10/21/2019 1346   BILIRUBINUR NEGATIVE 10/21/2019 1346   BILIRUBINUR negative 03/15/2018 1049   BILIRUBINUR negative 12/26/2017 0941    KETONESUR NEGATIVE 10/21/2019 1346   PROTEINUR 30 (A) 10/21/2019 1346   UROBILINOGEN 0.2 03/15/2018 1049   UROBILINOGEN 0.2 12/08/2010 1613   NITRITE NEGATIVE 10/21/2019 1346   LEUKOCYTESUR SMALL (A) 10/21/2019 1346   Sepsis Labs Invalid input(s): PROCALCITONIN,  WBC,  LACTICIDVEN Microbiology Recent Results (from the past 240 hour(s))  Culture, Urine     Status:  Abnormal   Collection Time: 10/21/19  1:46 PM   Specimen: Urine, Random  Result Value Ref Range Status   Specimen Description   Final    URINE, RANDOM Performed at [facility] [address], [place], [place] [address]    Special Requests   Final    NONE Performed at [facility], [place] [address], [place], [place] [address]    Culture >=100,000 COLONIES/mL STAPHYLOCOCCUS AUREUS (A)  Final   Report Status 10/24/2019 FINAL  Final   Organism ID, Bacteria STAPHYLOCOCCUS AUREUS (A)  Final      Susceptibility   Staphylococcus aureus - MIC*    CIPROFLOXACIN <=0.5 SENSITIVE Sensitive     GENTAMICIN <=0.5 SENSITIVE Sensitive     NITROFURANTOIN <=16 SENSITIVE Sensitive     OXACILLIN <=0.25 SENSITIVE Sensitive     TETRACYCLINE <=1 SENSITIVE Sensitive     VANCOMYCIN <=0.5 SENSITIVE Sensitive     TRIMETH/SULFA <=10 SENSITIVE Sensitive     CLINDAMYCIN <=0.25 SENSITIVE Sensitive     RIFAMPIN <=0.5 SENSITIVE Sensitive     Inducible Clindamycin NEGATIVE Sensitive     * >=100,000 COLONIES/mL STAPHYLOCOCCUS AUREUS  Culture, blood (routine x 2)     Status: None   Collection Time: 10/21/19  3:30 PM   Specimen: BLOOD  Result Value Ref Range Status   Specimen Description   Final    BLOOD LEFT ANTECUBITAL Performed at [facility] [address] [place], [place] [address]    Special Requests   Final    BOTTLES DRAWN AEROBIC ONLY Blood Culture adequate volume Performed at [facility] [address] [place], [place] [address]    Culture   Final    NO  GROWTH 5 DAYS Performed at [facility], [place] [address] [place], [place] [address]    Report Status 10/26/2019 FINAL  Final  Culture, blood (routine x 2)     Status: None   Collection Time: 10/21/19  3:30 PM   Specimen: BLOOD LEFT FOREARM  Result Value Ref Range Status   Specimen Description   Final    BLOOD LEFT FOREARM Performed at [facility] [address] [place], [place] [address]    Special Requests   Final    BOTTLES DRAWN AEROBIC ONLY Blood Culture results may not be optimal due to an inadequate volume of blood received in culture bottles Performed at [facility] [address], [place], [place] [address]    Culture   Final    NO GROWTH 5 DAYS Performed at [facility], [place] [address], [place], [place] [address]    Report Status 10/26/2019 FINAL  Final  SARS CORONAVIRUS 2 (TAT 6-24 HRS) Nasopharyngeal Nasopharyngeal Swab     Status: None   Collection Time: 10/21/19  4:06 PM   Specimen: Nasopharyngeal Swab  Result Value Ref Range Status   SARS Coronavirus 2 NEGATIVE NEGATIVE Final    Comment: (NOTE) SARS-CoV-2 target nucleic acids are NOT DETECTED. The SARS-CoV-2 RNA is generally detectable in upper and lower respiratory specimens during the acute phase of infection. Negative results do not preclude SARS-CoV-2 infection, do not rule out co-infections with other pathogens, and should not be used as the sole basis for treatment or other patient management decisions. Negative results must be combined with clinical observations, patient history, and epidemiological information. The expected result is Negative. Fact Sheet for Patients: SugarRoll.be Fact Sheet for Healthcare Providers: https://www.woods-mathews.com/ This test is  not yet approved or cleared by the [place] and  has been authorized for detection and/or diagnosis of SARS-CoV-2 by FDA under an Emergency Use  Authorization (EUA). This EUA will remain  in effect (meaning this test can be used) for the duration of the COVID-19 declaration under Section 56 4(b)(1) of the Act, 21 U.S.C. section 360bbb-3(b)(1), unless the authorization is terminated or revoked sooner. Performed at [facility], [place] [address], [place], [place] [address]      Time coordinating discharge: Over 40 minutes  SIGNED:   Charlynne Cousins, MD  [facility] 10/29/2019, 9:39 AM Pager   If 7PM-7AM, please contact night-coverage [email or web address] Password [email or web address]

## 2019-11-05 ENCOUNTER — Other Ambulatory Visit: Payer: Self-pay | Admitting: Critical Care Medicine

## 2019-11-05 ENCOUNTER — Encounter: Payer: Self-pay | Admitting: Critical Care Medicine

## 2019-11-05 MED ORDER — "PEN NEEDLES 3/16"" 31G X 5 MM MISC": 0 refills | Status: DC

## 2019-11-05 MED ORDER — LEVEMIR FLEXTOUCH 100 UNIT/ML ~~LOC~~ SOPN: 30.0000 [IU] | PEN_INJECTOR | Freq: Every day | SUBCUTANEOUS | 11 refills | Status: DC

## 2019-11-05 MED FILL — !LEVEMIR FLEXTOUCH 100 UNIT: 100 | 30 days supply | Qty: 9 | Fill #0

## 2019-11-06 NOTE — Progress Notes (Signed)
Patient ID: Rodney Barber, male   DOB: 07/16/1966, 54 y.o.   MRN: [id number] This is a 54 year old male comes to the [facility] after having been just discharged from the hospital in March and previously in February for severe diabetes hyperglycemia chronic pancreatitis  On exam blood pressure is 110/64 pulse 73 saturation 99% room air blood sugar is off the register and not recordable  Exam the patient is thin in no acute distress abdomen benign chest clear  Impression is that this patient has diabetes out-of-control plan will be to increase his Levemir to 30 units daily we will try to obtain the pain obtain for this patient a FlexPen so that he can dial up his insulin more easily number to get him into the community [facility] wellness clinic for further follow-up

## 2019-11-11 ENCOUNTER — Ambulatory Visit: Payer: Self-pay | Admitting: Critical Care Medicine

## 2019-11-11 NOTE — Congregational Nurse Program (Signed)
Following Mr.Ackert for his medication management. Reviewed his technique with self administration of his insulin pen. He has returned demonstration successfully. 110/64 F/S 324.

## 2019-11-11 NOTE — Progress Notes (Deleted)
   Subjective:    Patient ID: Rodney Barber, male    DOB: May 21, 1966, 54 y.o.   MRN: [id number]  54 y.o.M  Here to establish.   DM2  Recent hosp stay  Admit date: 10/21/2019 Discharge date: 10/29/2019  Admitted From: Home Disposition:  Home  Recommendations for Outpatient Follow-up:  1. Follow up with PCP in 1-2 weeks, refer patient for colonoscopy as an outpatient. 2. Check a CBC in 4 weeks.   Home Health:No Equipment/Devices:None  Discharge Condition:Stable CODE STATUS:Full Diet recommendation: Heart Healthy   Brief/Interim Summary: 54 y.o.malehomeless with past medical history of diabetes mellitus type 2 essential hypertension, chronic pancreatitis prior history of alcohol abuse who resides in a shelter was having dizziness for about 2 days prior to admission, in the ED he was noted to have a blood sugar of 947 and bicarbonate of 28 and hypothermic on physical exam he had peripheral edema. Was admitted and treated for hyperosmolar nonketotic hyperglycemia, hospitalization was complicated by ongoing diarrhea and anemia  Discharge Diagnoses:  Principal Problem:   Diabetic hyperosmolar non-ketotic state (HCC) Active Problems:   Chronic pancreatitis (HCC)   Severe malnutrition (HCC)   Uncontrolled type 2 diabetes mellitus (HCC)   Abnormal liver function tests   Hyperglycemia   Tobacco use   Non compliance with medical treatment Diabetic hyperosmolar nonketotic state: He was started on IV insulin, his hyperglycemia was likely due to noncompliance with his medication.  Once his blood glucose was stabilized, he was transitioned to long-acting insulin plus sliding scale we will continue this regimen as an outpatient. He will follow-up with the wellness center clinic.  Acute diastolic heart failure/hypoalbuminemia in the setting of chronic liver disease: A 2D echo was done that showed diastolic dysfunction. He was started on IV Lasix and his lower extremity edema  resolved. He was changed to oral Lasix which she will continue as an outpatient.  Acute on chronic anemia: With a baseline hemoglobin of 9-10 dropped to 7.3 there were no signs of overt bleeding CT scan of the abdomen and pelvis showed no acute findings he was transfused 1 unit of packed red blood cells. GI was consulted who recommended follow-up as an outpatient.  Elevated AST and ALT: Likely due to alcohol abuse will need further work-up as an out patient as per recommended per GI.  Diarrhea due to chronic alcoholic pancreatitis: He was started on oral Creon now which improve.  Mild pseudohyponatremia: Likely due to hyperglycemia resolved.  Severe protein caloric malnutrition: Continue protein supplementation.  Essential hypertension: Norvasc was discontinued due to his lower extremity edema he was continue on low-dose lisinopril started on Lasix and his blood pressure is fairly controlled will need follow-up as an outpatient.  Medication noncompliance: He was counseled extensively.      Review of Systems     Objective:   Physical Exam        Assessment & Plan:

## 2019-11-14 ENCOUNTER — Observation Stay (HOSPITAL_COMMUNITY)
Admission: EM | Admit: 2019-11-14 | Discharge: 2019-11-15 | Disposition: A | Discharge status: Home / Self Care | Payer: Self-pay | Attending: Internal Medicine | Admitting: Internal Medicine

## 2019-11-14 ENCOUNTER — Emergency Department (HOSPITAL_COMMUNITY): Payer: Self-pay

## 2019-11-14 ENCOUNTER — Other Ambulatory Visit: Payer: Self-pay

## 2019-11-14 ENCOUNTER — Encounter (HOSPITAL_COMMUNITY): Payer: Self-pay

## 2019-11-14 DIAGNOSIS — Z590 Homelessness: Secondary | ICD-10-CM | POA: Insufficient documentation

## 2019-11-14 DIAGNOSIS — Z9111 Patient's noncompliance with dietary regimen: Secondary | ICD-10-CM | POA: Insufficient documentation

## 2019-11-14 DIAGNOSIS — E43 Unspecified severe protein-calorie malnutrition: Secondary | ICD-10-CM | POA: Diagnosis present

## 2019-11-14 DIAGNOSIS — Z833 Family history of diabetes mellitus: Secondary | ICD-10-CM | POA: Insufficient documentation

## 2019-11-14 DIAGNOSIS — Z794 Long term (current) use of insulin: Secondary | ICD-10-CM | POA: Insufficient documentation

## 2019-11-14 DIAGNOSIS — Z91148 Patient's other noncompliance with medication regimen for other reason: Secondary | ICD-10-CM

## 2019-11-14 DIAGNOSIS — E785 Hyperlipidemia, unspecified: Secondary | ICD-10-CM | POA: Insufficient documentation

## 2019-11-14 DIAGNOSIS — F329 Major depressive disorder, single episode, unspecified: Secondary | ICD-10-CM | POA: Insufficient documentation

## 2019-11-14 DIAGNOSIS — E162 Hypoglycemia, unspecified: Secondary | ICD-10-CM | POA: Diagnosis present

## 2019-11-14 DIAGNOSIS — Z79899 Other long term (current) drug therapy: Secondary | ICD-10-CM | POA: Insufficient documentation

## 2019-11-14 DIAGNOSIS — F17210 Nicotine dependence, cigarettes, uncomplicated: Secondary | ICD-10-CM | POA: Insufficient documentation

## 2019-11-14 DIAGNOSIS — Z20822 Contact with and (suspected) exposure to covid-19: Secondary | ICD-10-CM | POA: Insufficient documentation

## 2019-11-14 DIAGNOSIS — R4182 Altered mental status, unspecified: Secondary | ICD-10-CM | POA: Diagnosis present

## 2019-11-14 DIAGNOSIS — Z9114 Patient's other noncompliance with medication regimen: Secondary | ICD-10-CM | POA: Insufficient documentation

## 2019-11-14 DIAGNOSIS — I10 Essential (primary) hypertension: Secondary | ICD-10-CM | POA: Insufficient documentation

## 2019-11-14 DIAGNOSIS — N3001 Acute cystitis with hematuria: Secondary | ICD-10-CM

## 2019-11-14 DIAGNOSIS — E11649 Type 2 diabetes mellitus with hypoglycemia without coma: Principal | ICD-10-CM | POA: Insufficient documentation

## 2019-11-14 DIAGNOSIS — K8689 Other specified diseases of pancreas: Secondary | ICD-10-CM | POA: Insufficient documentation

## 2019-11-14 DIAGNOSIS — T68XXXA Hypothermia, initial encounter: Secondary | ICD-10-CM

## 2019-11-14 LAB — CBC WITH DIFFERENTIAL/PLATELET
Abs Immature Granulocytes: 0.04 10*3/uL (ref 0.00–0.07)
Basophils Absolute: 0 10*3/uL (ref 0.0–0.1)
Basophils Relative: 1 %
Eosinophils Absolute: 0 10*3/uL (ref 0.0–0.5)
Eosinophils Relative: 0 %
HCT: 28.7 % — ABNORMAL LOW (ref 39.0–52.0)
Hemoglobin: 8.9 g/dL — ABNORMAL LOW (ref 13.0–17.0)
Immature Granulocytes: 1 %
Lymphocytes Relative: 15 %
Lymphs Abs: 1.2 10*3/uL (ref 0.7–4.0)
MCH: 30.2 pg (ref 26.0–34.0)
MCHC: 31 g/dL (ref 30.0–36.0)
MCV: 97.3 fL (ref 80.0–100.0)
Monocytes Absolute: 0.6 10*3/uL (ref 0.1–1.0)
Monocytes Relative: 8 %
Neutro Abs: 6.2 10*3/uL (ref 1.7–7.7)
Neutrophils Relative %: 75 %
Platelets: 346 10*3/uL (ref 150–400)
RBC: 2.95 MIL/uL — ABNORMAL LOW (ref 4.22–5.81)
RDW: 17.3 % — ABNORMAL HIGH (ref 11.5–15.5)
WBC: 8.1 10*3/uL (ref 4.0–10.5)
nRBC: 0 % (ref 0.0–0.2)

## 2019-11-14 LAB — I-STAT CHEM 8, ED
BUN: 11 mg/dL (ref 6–20)
Calcium, Ion: 1.18 mmol/L (ref 1.15–1.40)
Chloride: 108 mmol/L (ref 98–111)
Creatinine, Ser: 0.5 mg/dL — ABNORMAL LOW (ref 0.61–1.24)
Glucose, Bld: 109 mg/dL — ABNORMAL HIGH (ref 70–99)
HCT: 26 % — ABNORMAL LOW (ref 39.0–52.0)
Hemoglobin: 8.8 g/dL — ABNORMAL LOW (ref 13.0–17.0)
Potassium: 3.7 mmol/L (ref 3.5–5.1)
Sodium: 139 mmol/L (ref 135–145)
TCO2: 24 mmol/L (ref 22–32)

## 2019-11-14 LAB — COMPREHENSIVE METABOLIC PANEL
ALT: 39 U/L (ref 0–44)
AST: 42 U/L — ABNORMAL HIGH (ref 15–41)
Albumin: 2.7 g/dL — ABNORMAL LOW (ref 3.5–5.0)
Alkaline Phosphatase: 224 U/L — ABNORMAL HIGH (ref 38–126)
Anion gap: 5 (ref 5–15)
BUN: 12 mg/dL (ref 6–20)
CO2: 23 mmol/L (ref 22–32)
Calcium: 8 mg/dL — ABNORMAL LOW (ref 8.9–10.3)
Chloride: 111 mmol/L (ref 98–111)
Creatinine, Ser: 0.6 mg/dL — ABNORMAL LOW (ref 0.61–1.24)
GFR calc Af Amer: >60 mL/min (ref >60)
GFR calc non Af Amer: >60 mL/min (ref >60)
Glucose, Bld: 116 mg/dL — ABNORMAL HIGH (ref 70–99)
Potassium: 3.8 mmol/L (ref 3.5–5.1)
Sodium: 139 mmol/L (ref 135–145)
Total Bilirubin: 0.3 mg/dL (ref 0.3–1.2)
Total Protein: 5.9 g/dL — ABNORMAL LOW (ref 6.5–8.1)

## 2019-11-14 LAB — GLUCOSE, CAPILLARY
Glucose-Capillary: 202 mg/dL — ABNORMAL HIGH (ref 70–99)
Glucose-Capillary: 269 mg/dL — ABNORMAL HIGH (ref 70–99)

## 2019-11-14 LAB — RESPIRATORY PANEL BY RT PCR (FLU A&B, COVID)
Influenza A by PCR: NEGATIVE
Influenza B by PCR: NEGATIVE
SARS Coronavirus 2 by RT PCR: NEGATIVE

## 2019-11-14 LAB — URINALYSIS, ROUTINE W REFLEX MICROSCOPIC
Bilirubin Urine: NEGATIVE
Glucose, UA: 50 mg/dL — AB
Ketones, ur: NEGATIVE mg/dL
Nitrite: POSITIVE — AB
Protein, ur: >=300 mg/dL — AB
RBC / HPF: >50 RBC/hpf — ABNORMAL HIGH (ref 0–5)
Specific Gravity, Urine: 1.011 (ref 1.005–1.030)
WBC, UA: >50 WBC/hpf — ABNORMAL HIGH (ref 0–5)
pH: 5 (ref 5.0–8.0)

## 2019-11-14 LAB — LACTIC ACID, PLASMA: Lactic Acid, Venous: 0.8 mmol/L (ref 0.5–1.9)

## 2019-11-14 LAB — RAPID URINE DRUG SCREEN, HOSP PERFORMED
Amphetamines: NOT DETECTED
Barbiturates: NOT DETECTED
Benzodiazepines: NOT DETECTED
Cocaine: NOT DETECTED
Opiates: NOT DETECTED
Tetrahydrocannabinol: NOT DETECTED

## 2019-11-14 LAB — CBG MONITORING, ED
Glucose-Capillary: 127 mg/dL — ABNORMAL HIGH (ref 70–99)
Glucose-Capillary: 39 mg/dL — CL (ref 70–99)
Glucose-Capillary: 125 mg/dL — ABNORMAL HIGH (ref 70–99)
Glucose-Capillary: 160 mg/dL — ABNORMAL HIGH (ref 70–99)
Glucose-Capillary: 306 mg/dL — ABNORMAL HIGH (ref 70–99)
Glucose-Capillary: 239 mg/dL — ABNORMAL HIGH (ref 70–99)

## 2019-11-14 LAB — PROTIME-INR
INR: 0.9 (ref 0.8–1.2)
Prothrombin Time: 12 seconds (ref 11.4–15.2)

## 2019-11-14 LAB — APTT: aPTT: 30 seconds (ref 24–36)

## 2019-11-14 MED ORDER — ENOXAPARIN SODIUM 40 MG/0.4ML ~~LOC~~ SOLN
40.0000 mg | SUBCUTANEOUS | Status: DC
  Administered 2019-11-14: 40 mg via SUBCUTANEOUS
  Filled 2019-11-14: qty 0.4

## 2019-11-14 MED ORDER — SODIUM CHLORIDE 0.9 % IV SOLN
1.0000 g | INTRAVENOUS | Status: DC
  Administered 2019-11-14: 1 g via INTRAVENOUS
  Filled 2019-11-14: qty 1
  Filled 2019-11-14: qty 10

## 2019-11-14 MED ORDER — ESCITALOPRAM OXALATE 10 MG PO TABS
5.0000 mg | ORAL_TABLET | Freq: Every day | ORAL | Status: DC
  Administered 2019-11-15: 5 mg via ORAL
  Filled 2019-11-14: qty 1

## 2019-11-14 MED ORDER — DEXTROSE 10 % IV SOLN
100.0000 mL | INTRAVENOUS | Status: DC
  Administered 2019-11-14: 100 mL via INTRAVENOUS

## 2019-11-14 MED ORDER — AMLODIPINE BESYLATE 5 MG PO TABS
5.0000 mg | ORAL_TABLET | Freq: Every day | ORAL | Status: DC
  Administered 2019-11-15: 5 mg via ORAL
  Filled 2019-11-14: qty 1

## 2019-11-14 MED ORDER — ACETAMINOPHEN 325 MG PO TABS: 650.0000 mg | ORAL_TABLET | Freq: Four times a day (QID) | ORAL | Status: DC | PRN

## 2019-11-14 MED ORDER — INSULIN ASPART 100 UNIT/ML ~~LOC~~ SOLN
0.0000 [IU] | Freq: Three times a day (TID) | SUBCUTANEOUS | Status: DC
  Administered 2019-11-14: 19:00:00 2 [IU] via SUBCUTANEOUS
  Administered 2019-11-15: 3 [IU] via SUBCUTANEOUS
  Administered 2019-11-15: 1 [IU] via SUBCUTANEOUS
  Administered 2019-11-15: 3 [IU] via SUBCUTANEOUS

## 2019-11-14 MED ORDER — SODIUM CHLORIDE 0.9% FLUSH
3.0000 mL | Freq: Two times a day (BID) | INTRAVENOUS | Status: DC
  Administered 2019-11-14 – 2019-11-15 (×2): 3 mL via INTRAVENOUS

## 2019-11-14 MED ORDER — DEXTROSE 50 % IV SOLN
INTRAVENOUS | Status: AC
  Administered 2019-11-14: 25 g via INTRAVENOUS
  Filled 2019-11-14: qty 50

## 2019-11-14 MED ORDER — ATORVASTATIN CALCIUM 10 MG PO TABS: 20.0000 mg | ORAL_TABLET | Freq: Every day | ORAL | Status: DC

## 2019-11-14 MED ORDER — ACETAMINOPHEN 650 MG RE SUPP: 650.0000 mg | Freq: Four times a day (QID) | RECTAL | Status: DC | PRN

## 2019-11-14 MED ORDER — FOLIC ACID 1 MG PO TABS
1.0000 mg | ORAL_TABLET | Freq: Every day | ORAL | Status: DC
  Administered 2019-11-15: 11:00:00 1 mg via ORAL
  Filled 2019-11-14: qty 1

## 2019-11-14 MED ORDER — THIAMINE HCL 100 MG PO TABS
100.0000 mg | ORAL_TABLET | Freq: Every day | ORAL | Status: DC
  Administered 2019-11-14 – 2019-11-15 (×2): 100 mg via ORAL
  Filled 2019-11-14 (×2): qty 1

## 2019-11-14 MED ORDER — LISINOPRIL 20 MG PO TABS
20.0000 mg | ORAL_TABLET | Freq: Every day | ORAL | Status: DC
  Administered 2019-11-15: 20 mg via ORAL
  Filled 2019-11-14: qty 1

## 2019-11-14 MED ORDER — SENNOSIDES-DOCUSATE SODIUM 8.6-50 MG PO TABS: 1.0000 | ORAL_TABLET | Freq: Every evening | ORAL | Status: DC | PRN

## 2019-11-14 MED ORDER — DEXTROSE 50 % IV SOLN: 25.0000 g | Freq: Once | INTRAVENOUS | Status: AC

## 2019-11-14 MED ORDER — PANCRELIPASE (LIP-PROT-AMYL) 12000-38000 UNITS PO CPEP
12000.0000 [IU] | ORAL_CAPSULE | Freq: Three times a day (TID) | ORAL | Status: DC
  Administered 2019-11-15 (×3): 12000 [IU] via ORAL
  Filled 2019-11-14 (×3): qty 1

## 2019-11-14 MED ORDER — DEXTROSE 10 % IV SOLN: INTRAVENOUS | Status: DC

## 2019-11-14 MED ORDER — SODIUM CHLORIDE 0.9 % IV SOLN
2.0000 g | Freq: Once | INTRAVENOUS | Status: AC
  Administered 2019-11-14: 10:00:00 2 g via INTRAVENOUS
  Filled 2019-11-14: qty 2

## 2019-11-14 MED ORDER — GLUCERNA SHAKE PO LIQD
237.0000 mL | Freq: Three times a day (TID) | ORAL | Status: DC
  Administered 2019-11-15 (×2): 237 mL via ORAL
  Filled 2019-11-14 (×4): qty 237

## 2019-11-14 MED ORDER — SODIUM CHLORIDE 0.9 % IV SOLN: 250.0000 mL | INTRAVENOUS | Status: DC | PRN

## 2019-11-14 MED ORDER — SODIUM CHLORIDE 0.9% FLUSH: 3.0000 mL | INTRAVENOUS | Status: DC | PRN

## 2019-11-14 NOTE — ED Notes (Signed)
Beir Hugger applied at this time.

## 2019-11-14 NOTE — ED Notes (Signed)
MD aware of pts temp and CBG

## 2019-11-14 NOTE — Progress Notes (Signed)
A consult was received from an ED physician for cefepime per pharmacy dosing.  The patient's profile has been reviewed for ht/wt/allergies/indication/available labs.   A one time order has been placed for cefepime 2 gm.    Further antibiotics/pharmacy consults should be ordered by admitting physician if indicated.                       Thank you, Herby Abraham, Pharm.D [phone] 11/14/2019 10:12 AM

## 2019-11-14 NOTE — Progress Notes (Addendum)
Inpatient Diabetes Program Recommendations  AACE/ADA: New Consensus Statement on Inpatient Glycemic Control (2015)  Target Ranges:  Prepandial:   less than 140 mg/dL      Peak postprandial:   less than 180 mg/dL (1-2 hours)      Critically ill patients:  140 - 180 mg/dL   Lab Results  Component Value Date   GLUCAP 160 (H) 11/14/2019   HGBA1C >18.5 (H) 09/10/2019    Review of Glycemic Control  Diabetes history: DM2 Outpatient Diabetes medications: None Current orders for Inpatient glycemic control: Levemir 30 units QD  HgbA1C - > 18.5%  Inpatient Diabetes Program Recommendations:     Levemir 6 units bid Novolog 0-6 units tidwc and 0-5 units QHS Novolog 2 units tidwc if pt eats > 50% meal  Follow closely.  Thank you. Ailene Ards, RD, LDN, CDE Inpatient Diabetes Coordinator [phone]  Addendum: Spoke to pt in ED about his diabetes. Pt states he took his insulin last night - 30 units. When questioned exactly what he took, he could not tell me. Just wanted his lunch tray. "I want to eat."

## 2019-11-14 NOTE — Progress Notes (Signed)
Transition of care to 5E Room 1508.

## 2019-11-14 NOTE — ED Triage Notes (Signed)
Pt BIB [facility]. Pt was found to be hypoglycemic at 35. EMS administered 250cc D10 via IV. Pt is unsure of any insulin intake.

## 2019-11-14 NOTE — ED Notes (Signed)
Pt provided with Meal tray  

## 2019-11-14 NOTE — H&P (Signed)
History and Physical    Rodney Barber XIP:[id number] DOB: July 20, 1966 DOA: 11/14/2019  PCP: Patient, No Pcp Per  Patient coming from:   I have personally briefly reviewed patient's old medical records in [place]  Chief Complaint: AMS  HPI: Rodney Barber is a 54 y.o. male with medical history significant of type 2 diabetes/IDDM, hypertension, pancreatitis, tobacco use, and homelessness who presents with hypoglycemia.  Patient is alert and oriented at present but unable to give great recollection of this morning.  He states he recalls speaking with EMS and understands he is now at [facility] because of the low blood sugar.  He states he was in usual state of health through last night and took his insulin in the evening as he normally does.  However he cannot tell me what dose of insulin he takes.  However he does state that he does not normally get a low blood sugar and has not had issues like this throughout the week.  He denies any fevers or chills.  He denies any new rashes, urinary symptoms, nausea vomiting or diarrhea.  He reports he is otherwise taking his meds without assistance.  He denies any other symptoms, no cough/shortness of breath, chest pain.  Per EMS report, patient was altered and noted to be hypoglycemic to 35.  He received D10, 250 cc in route and in the emergency room was again noted to be hypoglycemic to 30s.  Patient reports using tobacco, approximately half pack per day.  No alcohol, no drugs  He reports ambulating with the assistance of a walker, denies any dizziness lightheadedness or falls   Review of Systems: As per HPI otherwise 10 point review of systems negative.   Past Medical History:  Diagnosis Date  . Diabetes mellitus   . Hypertension   . Pancreatitis     Past Surgical History:  Procedure Laterality Date  . ERCP  06/27/2011   Procedure: ENDOSCOPIC RETROGRADE CHOLANGIOPANCREATOGRAPHY (ERCP);  Surgeon: Jeryl Columbia, MD;  Location: [facility]  END[facility];  Service: Endoscopy;  Laterality: N/A;  . ERCP W/ PLASTIC STENT PLACEMENT  03/2011  . IR US GUIDE BX ASP/DRAIN  05/16/2019  . TEE WITHOUT CARDIOVERSION N/A 05/20/2019   Procedure: TRANSESOPHAGEAL ECHOCARDIOGRAM (TEE);  Surgeon: [name], MD;  Location: [facility] ENDOSCOPY;  Service: Cardiovascular;  Laterality: N/A;     reports that he has been smoking cigarettes. He has a 15.00 pack-year smoking history. He has never used smokeless tobacco. He reports previous drug use. Drugs: "Crack" cocaine, Other-see comments, and Cocaine. He reports that he does not drink alcohol.  No Known Allergies  Family History  Problem Relation Age of Onset  . Diabetes Mother   . Diabetes Brother     Prior to Admission medications   Medication Sig Start Date End Date Taking? Authorizing Provider  amLODipine (NORVASC) 5 MG tablet Take 1 tablet (5 mg total) by mouth daily. 09/18/19 10/18/19  Elsie Stain, MD  atorvastatin (LIPITOR) 20 MG tablet Take 1 tablet (20 mg total) by mouth daily at 6 PM. 06/25/19 09/09/19  Sheth, Vickii Chafe, MD  blood glucose meter kit and supplies KIT Dispense based on patient and insurance preference. Use up to four times daily as directed. (FOR ICD-9 250.00, 250.01). 10/29/19   Charlynne Cousins, MD  Blood Glucose Monitoring Suppl (TRUERESULT BLOOD GLUCOSE) w/Device KIT uad 09/11/19   Mariel Aloe, MD  escitalopram (LEXAPRO) 10 MG tablet Take 0.5 tablets (5 mg total) by mouth daily. 06/25/19 09/09/19  Sheth, Devam P, MD  feeding supplement, GLUCERNA SHAKE, (GLUCERNA SHAKE) LIQD Take 237 mLs by mouth 3 (three) times daily between meals. Patient not taking: Reported on 10/21/2019 09/11/19   Mariel Aloe, MD  folic acid (FOLVITE) 1 MG tablet Take 1 tablet (1 mg total) by mouth daily. Patient not taking: Reported on 10/21/2019 06/25/19   Vicenta Dunning, MD  furosemide (LASIX) 20 MG tablet Take 1 tablet (20 mg total) by mouth daily. 10/30/19   Charlynne Cousins, MD  insulin  detemir (LEVEMIR FLEXTOUCH) 100 UNIT/ML FlexPen Inject 30 Units into the skin daily. 11/05/19   Elsie Stain, MD  Insulin Pen Needle (PEN NEEDLES 3/16") 31G X 5 MM MISC Use with levemir 11/05/19   Elsie Stain, MD  Insulin Syringe-Needle U-100 (INSULIN SYRINGE .3CC/31GX5/16") 31G X 5/16" 0.3 ML MISC Draw 8 units of Levemir with syringe twice per day 10/29/19   Charlynne Cousins, MD  lactulose (CHRONULAC) 10 GM/15ML solution Take 30 mLs (20 g total) by mouth 2 (two) times daily. Patient not taking: Reported on 10/21/2019 03/06/19   Aline August, MD  lipase/protease/amylase (CREON) 12000 units CPEP capsule Take 1 capsule (12,000 Units total) by mouth 3 (three) times daily before meals. 10/29/19   Charlynne Cousins, MD  lisinopril (ZESTRIL) 20 MG tablet Take 1 tablet (20 mg total) by mouth daily. 10/29/19 11/28/19  Charlynne Cousins, MD  nicotine (NICODERM CQ - DOSED IN MG/24 HOURS) 14 mg/24hr patch Place 1 patch (14 mg total) onto the skin daily. Patient not taking: Reported on 10/21/2019 06/26/19   Vicenta Dunning, MD  TRUEplus Lancets 28G MISC uad 10/29/19   Charlynne Cousins, MD    Physical Exam: Vitals:   11/14/19 0904 11/14/19 0912 11/14/19 0930 11/14/19 1000  BP: (!) 150/80  (!) 152/78 (!) 150/83  Pulse: 78  70 77  Resp: 16  13 16   Temp:  (!) 94 F (34.4 C)    TempSrc:  Rectal    SpO2: 100%  100% 100%    Vitals:   11/14/19 0904 11/14/19 0912 11/14/19 0930 11/14/19 1000  BP: (!) 150/80  (!) 152/78 (!) 150/83  Pulse: 78  70 77  Resp: 16  13 16   Temp:  (!) 94 F (34.4 C)    TempSrc:  Rectal    SpO2: 100%  100% 100%     Constitutional: NAD, calm, comfortable, awake and oriented x2 not fully clear on situation and date, wearing Bair hugger, thin Eyes: PERRL, lids and conjunctivae normal ENMT: Mucous membranes are moist. Posterior pharynx clear of any exudate or lesions.poor dentition Neck: normal, supple, no masses, no thyromegaly Respiratory: clear to auscultation  bilaterally, no wheezing, no crackles. Normal respiratory effort. No accessory muscle use.  Cardiovascular: Regular rate and rhythm, no murmurs / rubs / gallops. No extremity edema. 2+ pedal pulses. No carotid bruits.  Abdomen: no tenderness, no masses palpated. No hepatosplenomegaly. Bowel sounds positive.  Musculoskeletal: no clubbing / cyanosis. No joint deformity upper and lower extremities. Good ROM, no contractures. Normal muscle tone.  Moving all extremities spontaneously Skin: no rashes, lesions, ulcers. No induration Neurologic: CN 2-12 grossly intact.  No gross motor or sensory deficits Psychiatric: Guarded judgment and insight, oriented x2, pleasant affect   Labs on Admission: I have personally reviewed following labs and imaging studies  CBC: Recent Labs  Lab 11/14/19 0944  HGB 8.8*  HCT 70.2*   Basic Metabolic Panel: Recent Labs  Lab 11/14/19 0935 11/14/19 0944  NA 139 139  K 3.8 3.7  CL 111 108  CO2 23  --   GLUCOSE 116* 109*  BUN 12 11  CREATININE 0.60* 0.50*  CALCIUM 8.0*  --    GFR: CrCl cannot be calculated (Unknown ideal weight.). Liver Function Tests: Recent Labs  Lab 11/14/19 0935  AST 42*  ALT 39  ALKPHOS 224*  BILITOT 0.3  PROT 5.9*  ALBUMIN 2.7*   No results for input(s): LIPASE, AMYLASE in the last 168 hours. No results for input(s): AMMONIA in the last 168 hours. Coagulation Profile: No results for input(s): INR, PROTIME in the last 168 hours. Cardiac Enzymes: No results for input(s): CKTOTAL, CKMB, CKMBINDEX, TROPONINI in the last 168 hours. BNP (last 3 results) No results for input(s): PROBNP in the last 8760 hours. HbA1C: No results for input(s): HGBA1C in the last 72 hours. CBG: Recent Labs  Lab 11/14/19 0857 11/14/19 0928 11/14/19 1011  GLUCAP 39* 127* 125*   Lipid Profile: No results for input(s): CHOL, HDL, LDLCALC, TRIG, CHOLHDL, LDLDIRECT in the last 72 hours. Thyroid Function Tests: No results for input(s): TSH,  T4TOTAL, FREET4, T3FREE, THYROIDAB in the last 72 hours. Anemia Panel: No results for input(s): VITAMINB12, FOLATE, FERRITIN, TIBC, IRON, RETICCTPCT in the last 72 hours. Urine analysis:    Component Value Date/Time   COLORURINE YELLOW 11/14/2019 0935   APPEARANCEUR CLOUDY (A) 11/14/2019 0935   LABSPEC 1.011 11/14/2019 0935   PHURINE 5.0 11/14/2019 0935   GLUCOSEU 50 (A) 11/14/2019 0935   HGBUR LARGE (A) 11/14/2019 0935   BILIRUBINUR NEGATIVE 11/14/2019 0935   BILIRUBINUR negative 03/15/2018 1049   BILIRUBINUR negative 12/26/2017 0941   KETONESUR NEGATIVE 11/14/2019 0935   PROTEINUR >=300 (A) 11/14/2019 0935   UROBILINOGEN 0.2 03/15/2018 1049   UROBILINOGEN 0.2 12/08/2010 1613   NITRITE POSITIVE (A) 11/14/2019 0935   LEUKOCYTESUR LARGE (A) 11/14/2019 0935    Radiological Exams on Admission: DG Chest Port 1 View  Result Date: 11/14/2019 CLINICAL DATA:  Hypoglycemia. EXAM: PORTABLE CHEST 1 VIEW COMPARISON:  10/21/2019 and 02/19/2019 chest radiograph. FINDINGS: Mild lung hypoinflation.  No focal consolidation. No pneumothorax or pleural effusion. Cardiomediastinal silhouette is unchanged. Osseous structures are unremarkable. IMPRESSION: No acute airspace disease. Electronically Signed   By: Primitivo Gauze M.D.   On: 11/14/2019 09:54    Assessment/Plan Rodney Barber is a 54 y.o. male with medical history significant of type 2 diabetes/IDDM, hypertension, HLD, Depression, pancreatitis, tobacco use, and homelessness who presents with symptomatic hypoglycemia  # Symptomatic Hypoglycemia # Possible UTI # T2DM/IDDM # Hypothermia -Suspect patient has accidentally taken too much of his Levemir, given that this has not occurred previously nor this past week if he has difficulty explaining to me his home regimen.  He does not report fevers or chills but is hypothermic, though this has been noted previously in the absence of infection.  His infectious work-up was revealing for pyuria on  urinalysis, otherwise normal chest x-ray and his symptoms were unremarkable for infection. -We will continue D10 drip and monitor CBGs closely -Obtained blood cultures, urine culture -Empirically treat for UTI given his presentation -Held insulin will need repeat diabetes education -Continue ISS and Accu-Cheks - would recommend Pharmacy education pre-discharge regarding meds  # Hypertension -Monitor blood pressure today, resume amlodipine and lisinopril in the morning  # Hyperlipidemia -Continue atorvastatin  # Depression -Continue escitalopram  # Pancreatic insufficiency - hx of alcohol use and chronic alcoholic pancreatitis, no longer drinking -Continue enzymes with meals  # Tobacco use  disorder -Counseled, patient currently not working to reducing dose  # Severe Protein Calorie Malnutrition - will consult nutrition - continue folic acid, thiamine and glucerna  DVT prophylaxis: Lovenox Code Status: Full Disposition Plan: TBD, pending stability of blood sugars Admission status: Observation   Truddie Hidden MD [facility] Pager [phone]  If 7PM-7AM, please contact night-coverage [email or web address] Password [facility]  11/14/2019, 10:49 AM

## 2019-11-14 NOTE — ED Notes (Signed)
Pt had large loos BM. Pt cleaned. Peri care performed. Pt repositioned. Linens changed

## 2019-11-14 NOTE — ED Notes (Signed)
Beir hugger removed

## 2019-11-14 NOTE — ED Notes (Signed)
Per Karren Burly MD stop D10 infusion at this time due to CBG of 306 and recheck CBG in 1 hour

## 2019-11-14 NOTE — ED Provider Notes (Signed)
[facility] DEPT Provider Note   CSN: [id number] Arrival date & time: 11/14/19  3244     History Chief Complaint  Patient presents with  . Hypoglycemia    Rodney Barber is a 54 y.o. male.  Patient sent not very alert.  EMS was called.  Patient is blood sugar was 35.  EMS administered 250 cc of D10 via IV.  Patient states he is taking his insulin like he should but was not able to get into details.  Also stated that he was eating okay.  Upon arrival here patient was alert.  Blood sugars went back down into the 30s so patient given an amp of D50 and started on D10 drip.  Patient denies any fevers.  Denies any pain.  Any upper respiratory symptoms.  Patient's temperature here he was hypothermic.  So started on [facility].  Past medical significant for diabetes hypertension pancreatitis.  Patient in March had 2 admissions for hyperglycemia.  Patient is only on insulin.        Past Medical History:  Diagnosis Date  . Diabetes mellitus   . Hypertension   . Pancreatitis     Patient Active Problem List   Diagnosis Date Noted  . Non compliance with medical treatment 10/21/2019  . Generalized weakness 09/10/2019  . Dehydration 09/10/2019  . Transaminitis 09/10/2019  . Hyperglycemia without ketosis 09/09/2019  . Diabetic hyperosmolar non-ketotic state ([place]) 09/09/2019  . UTI (urinary tract infection) 05/14/2019  . Hyperglycemia 05/14/2019  . Tobacco use 05/14/2019  . Abnormal blood electrolyte level 05/14/2019  . Suicide attempt ([place]) 02/26/2019  . Acetaminophen toxicity   . Goals of care, counseling/discussion   . Palliative care by specialist   . Shock liver 02/18/2019  . Hyperosmolar non-ketotic state in patient with type 2 diabetes mellitus ([place]) 02/18/2019  . Alcohol use 02/18/2019  . Substance use disorder 02/18/2019  . AKI (acute kidney injury) ([place])   . Altered behavior   . Altered mental status 12/02/2018  . Abnormal liver function  tests 12/02/2018  . AMS (altered mental status) 12/02/2018  . Cough 12/02/2018  . Uncontrolled type 2 diabetes mellitus ([place]) 03/15/2018  . Urine test positive for microalbuminuria 07/12/2017  . Type 2 diabetes mellitus with other specified complication ([place]) 08/09/7251  . Liver lesion 07/22/2013  . 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 Diabetes ([place]) 07/18/2013  . Severe malnutrition ([place]) 07/11/2013  . Hypokalemia 07/11/2013  . Protein-calorie malnutrition, severe ([place]) 07/11/2013  . Chronic pancreatitis ([place]) 07/10/2013  . 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 Diabetes mellitus ([place]) 07/10/2013  . Weight loss 07/10/2013  . Diarrhea 07/10/2013  . Liver lesion, right lobe 07/10/2013    Past Surgical History:  Procedure Laterality Date  . ERCP  06/27/2011   Procedure: ENDOSCOPIC RETROGRADE CHOLANGIOPANCREATOGRAPHY (ERCP);  Surgeon: Jeryl Columbia, MD;  Location: [facility];  Service: Endoscopy;  Laterality: N/A;  . ERCP W/ PLASTIC STENT PLACEMENT  03/2011  . IR US GUIDE BX ASP/DRAIN  05/16/2019  . TEE WITHOUT CARDIOVERSION N/A 05/20/2019   Procedure: TRANSESOPHAGEAL ECHOCARDIOGRAM (TEE);  Surgeon: [name], MD;  Location: [facility] ENDOSCOPY;  Service: Cardiovascular;  Laterality: N/A;       Family History  Problem Relation Age of Onset  . Diabetes Mother   . Diabetes Brother     Social History   Tobacco Use  . Smoking status: Current Every Day Smoker    Packs/day: 0.50    Years: 30.00  Pack years: 15.00    Types: Cigarettes  . Smokeless tobacco: Never Used  . Tobacco comment: 6 cigarett /day  Substance Use Topics  . Alcohol use: No  . Drug use: Not Currently    Types: "Crack" cocaine, Other-see comments, Cocaine    Comment: last smoked crack 12-16-16    Home Medications Prior to Admission medications   Medication Sig Start Date End Date Taking? Authorizing Provider  amLODipine (NORVASC) 5 MG tablet Take 1 tablet (5 mg total) by  mouth daily. 09/18/19 10/18/19  Elsie Stain, MD  atorvastatin (LIPITOR) 20 MG tablet Take 1 tablet (20 mg total) by mouth daily at 6 PM. 06/25/19 09/09/19  Sheth, Vickii Chafe, MD  blood glucose meter kit and supplies KIT Dispense based on patient and insurance preference. Use up to four times daily as directed. (FOR ICD-9 250.00, 250.01). 10/29/19   Charlynne Cousins, MD  Blood Glucose Monitoring Suppl (TRUERESULT BLOOD GLUCOSE) w/Device KIT uad 09/11/19   Mariel Aloe, MD  escitalopram (LEXAPRO) 10 MG tablet Take 0.5 tablets (5 mg total) by mouth daily. 06/25/19 09/09/19  Sheth, Vickii Chafe, MD  feeding supplement, GLUCERNA SHAKE, (GLUCERNA SHAKE) LIQD Take 237 mLs by mouth 3 (three) times daily between meals. Patient not taking: Reported on 10/21/2019 09/11/19   Mariel Aloe, MD  folic acid (FOLVITE) 1 MG tablet Take 1 tablet (1 mg total) by mouth daily. Patient not taking: Reported on 10/21/2019 06/25/19   Vicenta Dunning, MD  furosemide (LASIX) 20 MG tablet Take 1 tablet (20 mg total) by mouth daily. 10/30/19   Charlynne Cousins, MD  insulin detemir (LEVEMIR FLEXTOUCH) 100 UNIT/ML FlexPen Inject 30 Units into the skin daily. 11/05/19   Elsie Stain, MD  Insulin Pen Needle (PEN NEEDLES 3/16") 31G X 5 MM MISC Use with levemir 11/05/19   Elsie Stain, MD  Insulin Syringe-Needle U-100 (INSULIN SYRINGE .3CC/31GX5/16") 31G X 5/16" 0.3 ML MISC Draw 8 units of Levemir with syringe twice per day 10/29/19   Charlynne Cousins, MD  lactulose (CHRONULAC) 10 GM/15ML solution Take 30 mLs (20 g total) by mouth 2 (two) times daily. Patient not taking: Reported on 10/21/2019 03/06/19   Aline August, MD  lipase/protease/amylase (CREON) 12000 units CPEP capsule Take 1 capsule (12,000 Units total) by mouth 3 (three) times daily before meals. 10/29/19   Charlynne Cousins, MD  lisinopril (ZESTRIL) 20 MG tablet Take 1 tablet (20 mg total) by mouth daily. 10/29/19 11/28/19  Charlynne Cousins, MD  nicotine  (NICODERM CQ - DOSED IN MG/24 HOURS) 14 mg/24hr patch Place 1 patch (14 mg total) onto the skin daily. Patient not taking: Reported on 10/21/2019 06/26/19   Vicenta Dunning, MD  TRUEplus Lancets 28G MISC uad 10/29/19   Charlynne Cousins, MD    Allergies    Patient has no known allergies.  Review of Systems   Review of Systems  Constitutional: Negative for chills and fever.  HENT: Negative for congestion, rhinorrhea and sore throat.   Eyes: Negative for visual disturbance.  Respiratory: Negative for cough and shortness of breath.   Cardiovascular: Negative for chest pain and leg swelling.  Gastrointestinal: Negative for abdominal pain, diarrhea, nausea and vomiting.  Genitourinary: Negative for dysuria.  Musculoskeletal: Negative for back pain and neck pain.  Skin: Negative for rash.  Neurological: Negative for dizziness, light-headedness and headaches.  Hematological: Does not bruise/bleed easily.  Psychiatric/Behavioral: Negative for confusion.    Physical Exam Updated Vital Signs  BP 134/70   Pulse 73   Temp (!) 94 F (34.4 C) (Rectal)   Resp 14   SpO2 100%   Physical Exam Vitals and nursing note reviewed.  Constitutional:      Appearance: Normal appearance. He is well-developed. He is ill-appearing.  HENT:     Head: Normocephalic and atraumatic.  Eyes:     Extraocular Movements: Extraocular movements intact.     Conjunctiva/sclera: Conjunctivae normal.     Pupils: Pupils are equal, round, and reactive to light.  Cardiovascular:     Rate and Rhythm: Normal rate and regular rhythm.     Heart sounds: No murmur.  Pulmonary:     Effort: Pulmonary effort is normal. No respiratory distress.     Breath sounds: Normal breath sounds.  Abdominal:     Palpations: Abdomen is soft.     Tenderness: There is no abdominal tenderness.  Musculoskeletal:     Cervical back: Normal range of motion and neck supple.  Skin:    General: Skin is warm and dry.     Capillary Refill:  Capillary refill takes less than 2 seconds.  Neurological:     General: No focal deficit present.     Mental Status: He is alert and oriented to person, place, and time.     Cranial Nerves: No cranial nerve deficit.     Sensory: No sensory deficit.     Motor: No weakness.     ED Results / Procedures / Treatments   Labs (all labs ordered are listed, but only abnormal results are displayed) Labs Reviewed  COMPREHENSIVE METABOLIC PANEL - Abnormal; Notable for the following components:      Result Value   Glucose, Bld 116 (*)    Creatinine, Ser 0.60 (*)    Calcium 8.0 (*)    Total Protein 5.9 (*)    Albumin 2.7 (*)    AST 42 (*)    Alkaline Phosphatase 224 (*)    All other components within normal limits  URINALYSIS, ROUTINE W REFLEX MICROSCOPIC - Abnormal; Notable for the following components:   APPearance CLOUDY (*)    Glucose, UA 50 (*)    Hgb urine dipstick LARGE (*)    Protein, ur >=300 (*)    Nitrite POSITIVE (*)    Leukocytes,Ua LARGE (*)    RBC / HPF >50 (*)    WBC, UA >50 (*)    Bacteria, UA FEW (*)    All other components within normal limits  CBG MONITORING, ED - Abnormal; Notable for the following components:   Glucose-Capillary 39 (*)    All other components within normal limits  CBG MONITORING, ED - Abnormal; Notable for the following components:   Glucose-Capillary 127 (*)    All other components within normal limits  CBG MONITORING, ED - Abnormal; Notable for the following components:   Glucose-Capillary 125 (*)    All other components within normal limits  I-STAT CHEM 8, ED - Abnormal; Notable for the following components:   Creatinine, Ser 0.50 (*)    Glucose, Bld 109 (*)    Hemoglobin 8.8 (*)    HCT 26.0 (*)    All other components within normal limits  CBG MONITORING, ED - Abnormal; Notable for the following components:   Glucose-Capillary 160 (*)    All other components within normal limits  RESPIRATORY PANEL BY RT PCR (FLU A&B, COVID)  CULTURE,  BLOOD (ROUTINE X 2)  CULTURE, BLOOD (ROUTINE X 2)  URINE CULTURE  LACTIC   ACID, PLASMA  RAPID URINE DRUG SCREEN, HOSP PERFORMED  APTT  PROTIME-INR  CBC WITH DIFFERENTIAL/PLATELET    EKG None   ED ECG REPORT   Date: 11/14/2019  Rate: 74  Rhythm: normal sinus rhythm  QRS Axis: normal  Intervals: normal  ST/T Wave abnormalities: nonspecific ST changes  Conduction Disutrbances:none  Narrative Interpretation:   Old EKG Reviewed: none available  I have personally reviewed the EKG tracing and agree with the computerized printout as noted.   Radiology DG Chest Port 1 View  Result Date: 11/14/2019 CLINICAL DATA:  Hypoglycemia. EXAM: PORTABLE CHEST 1 VIEW COMPARISON:  10/21/2019 and 02/19/2019 chest radiograph. FINDINGS: Mild lung hypoinflation.  No focal consolidation. No pneumothorax or pleural effusion. Cardiomediastinal silhouette is unchanged. Osseous structures are unremarkable. IMPRESSION: No acute airspace disease. Electronically Signed   By: Primitivo Gauze M.D.   On: 11/14/2019 09:54    Procedures Procedures (including critical care time)  CRITICAL CARE Performed by: Fredia Sorrow Total critical care time: 30 minutes Critical care time was exclusive of separately billable procedures and treating other patients. Critical care was necessary to treat or prevent imminent or life-threatening deterioration. Critical care was time spent personally by me on the following activities: development of treatment plan with patient and/or surrogate as well as nursing, discussions with consultants, evaluation of patient's response to treatment, examination of patient, obtaining history from patient or surrogate, ordering and performing treatments and interventions, ordering and review of laboratory studies, ordering and review of radiographic studies, pulse oximetry and re-evaluation of patient's condition.   Medications Ordered in ED Medications  sodium chloride flush (NS) 0.9 %  injection 3 mL (3 mLs Intravenous Given 11/14/19 1002)  sodium chloride flush (NS) 0.9 % injection 3 mL (has no administration in time range)  0.9 %  sodium chloride infusion (has no administration in time range)  dextrose 10 % infusion (100 mLs Intravenous New Bag/Given 11/14/19 0937)  dextrose 50 % solution 25 g (25 g Intravenous Given 11/14/19 0914)  ceFEPIme (MAXIPIME) 2 g in sodium chloride 0.9 % 100 mL IVPB (2 g Intravenous New Bag/Given (Non-Interop) 11/14/19 1029)    ED Course  I have reviewed the triage vital signs and the nursing notes.  Pertinent labs & imaging results that were available during my care of the patient were reviewed by me and considered in my medical decision making (see chart for details).    MDM Rules/Calculators/A&P                     Patient sepsis work-up.  Vital signs criteria not completely met for sepsis but he was hypothermic.  Not hypotensive.  That work-up showed negative chest x-ray hemoglobin hematocrit was baseline.  Urinalysis consistent with urinary tract infection.  May be playing a role.  Patient started on Maxipime for that.  Was unclear whether patient would have had an indwelling catheter during his recent visits.  So went with broad antibiotics to start with.  Urine sent for culture blood culture sent.  Patient's lactic acid was normal.  White blood cell count still pending.  Covid testing negative.  Patient now eating blood sugars remained in the 100s on the drip.  Hospitalist contacted for admission.  Final Clinical Impression(s) / ED Diagnoses Final diagnoses:  Hypoglycemia  Hypothermia, initial encounter  Acute cystitis with hematuria    Rx / DC Orders ED Discharge Orders    None       Fredia Sorrow, MD 11/14/19 1108

## 2019-11-15 DIAGNOSIS — Z91148 Patient's other noncompliance with medication regimen for other reason: Secondary | ICD-10-CM

## 2019-11-15 DIAGNOSIS — Z9114 Patient's other noncompliance with medication regimen: Secondary | ICD-10-CM

## 2019-11-15 LAB — CBC
HCT: 24.2 % — ABNORMAL LOW (ref 39.0–52.0)
Hemoglobin: 7.6 g/dL — ABNORMAL LOW (ref 13.0–17.0)
MCH: 30.4 pg (ref 26.0–34.0)
MCHC: 31.4 g/dL (ref 30.0–36.0)
MCV: 96.8 fL (ref 80.0–100.0)
Platelets: 312 10*3/uL (ref 150–400)
RBC: 2.5 MIL/uL — ABNORMAL LOW (ref 4.22–5.81)
RDW: 18.6 % — ABNORMAL HIGH (ref 11.5–15.5)
WBC: 5.9 10*3/uL (ref 4.0–10.5)
nRBC: 0 % (ref 0.0–0.2)

## 2019-11-15 LAB — BASIC METABOLIC PANEL
Anion gap: 7 (ref 5–15)
BUN: 14 mg/dL (ref 6–20)
CO2: 24 mmol/L (ref 22–32)
Calcium: 7.8 mg/dL — ABNORMAL LOW (ref 8.9–10.3)
Chloride: 105 mmol/L (ref 98–111)
Creatinine, Ser: 0.62 mg/dL (ref 0.61–1.24)
GFR calc Af Amer: >60 mL/min (ref >60)
GFR calc non Af Amer: >60 mL/min (ref >60)
Glucose, Bld: 286 mg/dL — ABNORMAL HIGH (ref 70–99)
Potassium: 5 mmol/L (ref 3.5–5.1)
Sodium: 136 mmol/L (ref 135–145)

## 2019-11-15 LAB — GLUCOSE, CAPILLARY
Glucose-Capillary: 265 mg/dL — ABNORMAL HIGH (ref 70–99)
Glucose-Capillary: 260 mg/dL — ABNORMAL HIGH (ref 70–99)
Glucose-Capillary: 178 mg/dL — ABNORMAL HIGH (ref 70–99)

## 2019-11-15 LAB — HEMOGLOBIN A1C
Hgb A1c MFr Bld: 13.9 % — ABNORMAL HIGH (ref 4.8–5.6)
Mean Plasma Glucose: 352 mg/dL

## 2019-11-15 MED ORDER — BLOOD GLUCOSE MONITOR KIT: PACK | 0 refills | Status: DC

## 2019-11-15 MED ORDER — LEVEMIR FLEXTOUCH 100 UNIT/ML ~~LOC~~ SOPN: 20.0000 [IU] | PEN_INJECTOR | Freq: Every day | SUBCUTANEOUS | 11 refills | Status: DC

## 2019-11-15 NOTE — Progress Notes (Cosign Needed)
Meal tray ordered per PT request.

## 2019-11-15 NOTE — Progress Notes (Signed)
SN and instructor to bedside to assist w/ wheelchair to lobby, again reviewed d/c instructions w/ pt.  One belongings bag in room w/ multiple medications, glucometer in bag.  He asked where cane, clothing, shoes, ID were.  Room checked for any additional belongings, none in room, pt. given paper scrubs/safety socks, AC notified of need for shoes (size 10.5), awaiting response, pt. In room resting.  Has bus voucher x2.

## 2019-11-15 NOTE — Progress Notes (Signed)
Pt leaving this afternoon with 2 bus passes, provided by CM. Pt leaving with prescription. States he has Glucose Meter and few supplies at [facility]. Pt will go to [facility]. Discharge instructions given/explained with pt verbalizing understanding.

## 2019-11-15 NOTE — Discharge Summary (Signed)
Physician Discharge Summary  Rodney Barber YFV:[id number] DOB: [birth year] DOA: 11/14/2019  PCP: Patient, No Pcp Per  Admit date: 11/14/2019 Discharge date: 11/15/2019  Admitted From: Shelter Disposition: Shelter  Recommendations for Outpatient Follow-up:  1. Follow up with PCP in 1-2 weeks 2. Please obtain BMP/CBC in one week  Discharge Condition: Stable CODE STATUS: Full Diet recommendation: Diabetic diet as tolerated  Brief/Interim Summary: Rodney Barber is a 54 y.o. male with medical history significant of type 2 diabetes/IDDM, hypertension, pancreatitis, tobacco use, and homelessness who presents with hypoglycemia.  Patient is alert and oriented at present but unable to give great recollection of this morning.  He states he recalls speaking with EMS and understands he is now at [facility] because of the low blood sugar.  He states he was in usual state of health through last night and took his insulin in the evening as he normally does.  However he cannot tell me what dose of insulin he takes.  However he does state that he does not normally get a low blood sugar and has not had issues like this throughout the week.  He denies any fevers or chills.  He denies any new rashes, urinary symptoms, nausea vomiting or diarrhea.  He reports he is otherwise taking his meds without assistance.  He denies any other symptoms, no cough/shortness of breath, chest pain. Per EMS report, patient was altered and noted to be hypoglycemic to 35.  He received D10, 250 cc in route and in the emergency room was again noted to be hypoglycemic to 30s. Patient reports using tobacco, approximately half pack per day.  No alcohol, no drugs. He reports ambulating with the assistance of a walker, denies any dizziness lightheadedness or falls.  Patient admitted as above with acute symptomatic hypoglycemia in the setting of medication and dietary noncompliance.  Patient is more awake alert oriented this morning, we  discussed his insulin regimen at length.  Patient declines any urinary symptoms, antibiotics will be discontinued appropriately.  It appears he has not been using his blood glucose monitor as he has run out of test strips and lancets, because of this he has been dosing his insulin regularly likely the reason for his A1c being quite elevated but being admitted for acute symptomatic hypoglycemia.  Given concern for ongoing hypoglycemia patient's Lantus has been decreased to 20 units nightly, discussed that he needs to maintain his diabetic diet and take this medication every night, follow his Accu-Cheks as scheduled and follow-up with his PCP in the next 3 to 4 days.  He indicates his last office visit was supposed to be Wednesday but due to transportation issues not being provided he was unable to point to their office.  Discussed that should he continue to be noncompliant or use medications inappropriately he had a markedly increased risk of death given his profound hypoglycemia.  Patient understood the risks of not continued medical compliance follow-up and medication administrations.  Patient is otherwise stable and agreeable for discharge home, ambulating without difficulty, tolerating p.o. quite well and glucose remains somewhat well controlled although minimally elevated in the setting of dextrose and hypoglycemic bolusing overnight.   Discharge Diagnoses:  Principal Problem:   Hypoglycemia Active Problems:   Severe malnutrition (HCC)   Protein-calorie malnutrition, severe (HCC)   Altered mental status   AMS (altered mental status)   Noncompliance with medications    Discharge Instructions  Discharge Instructions    Call MD for:  difficulty breathing, headache or visual disturbances  Complete by: As directed    Call MD for:  extreme fatigue   Complete by: As directed    Call MD for:  hives   Complete by: As directed    Call MD for:  persistant dizziness or light-headedness   Complete  by: As directed    Call MD for:  persistant nausea and vomiting   Complete by: As directed    Call MD for:  severe uncontrolled pain   Complete by: As directed    Call MD for:  temperature >100.4   Complete by: As directed    Diet - low sodium heart healthy   Complete by: As directed    Increase activity slowly   Complete by: As directed      Allergies as of 11/15/2019   No Known Allergies     Medication List    STOP taking these medications   furosemide 20 MG tablet Commonly known as: LASIX   INSULIN SYRINGE .3CC/31GX5/16" 31G X 5/16" 0.3 ML Misc   lactulose 10 GM/15ML solution Commonly known as: CHRONULAC   nicotine 14 mg/24hr patch Commonly known as: NICODERM CQ - dosed in mg/24 hours   Pen Needles 3/16" 31G X 5 MM Misc   TRUEplus Lancets 28G Misc   TRUEresult Blood Glucose w/Device Kit     TAKE these medications   amLODipine 5 MG tablet Commonly known as: NORVASC Take 1 tablet (5 mg total) by mouth daily.   atorvastatin 20 MG tablet Commonly known as: LIPITOR Take 1 tablet (20 mg total) by mouth daily at 6 PM.   blood glucose meter kit and supplies Kit Dispense based on patient and insurance preference. Use up to four times daily as directed. (FOR ICD-9 250.00, 250.01).   escitalopram 10 MG tablet Commonly known as: Lexapro Take 0.5 tablets (5 mg total) by mouth daily.   feeding supplement (GLUCERNA SHAKE) Liqd Take 237 mLs by mouth 3 (three) times daily between meals.   folic acid 1 MG tablet Commonly known as: FOLVITE Take 1 tablet (1 mg total) by mouth daily.   Levemir FlexTouch 100 UNIT/ML FlexPen Generic drug: insulin detemir Inject 20 Units into the skin at bedtime. What changed:   how much to take  when to take this   lipase/protease/amylase 12000 units Cpep capsule Commonly known as: CREON Take 1 capsule (12,000 Units total) by mouth 3 (three) times daily before meals.   lisinopril 20 MG tablet Commonly known as: ZESTRIL Take 1  tablet (20 mg total) by mouth daily.       No Known Allergies  Procedures/Studies: CT Head Wo Contrast  Result Date: 10/21/2019 CLINICAL DATA:  Altered mental status. Dizziness. EXAM: CT HEAD WITHOUT CONTRAST TECHNIQUE: Contiguous axial images were obtained from the base of the skull through the vertex without intravenous contrast. COMPARISON:  February 21, 2019. FINDINGS: Brain: Small bilateral convexity CSF-density hygromas that are new since July 2020 and measure up to 7 mm thickness in the axial plane. No acute intracranial hemorrhage, midline shift, herniation, cerebral edema or mass effect. 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 Vascular: Benign dural calcification, likely calcified lipomatosis along the anterior falx cerebri, unchanged. No dense vessel sign. Calcified atherosclerosis of the bilateral carotid siphons. Skull: Diffuse demineralization. A couple benign-appearing calvarial hemangiomas measuring up to 8 mm in the right parietal bone. Stable 7 mm sclerotic lesion in the right posterior parietal bone compared to July 2020. A 4 mm left frontal sinus benign osteoma. No skull  fracture. Sinuses/Orbits: Mild bilateral maxillary and left ethmoidal mucosal thickening. Normal bilateral orbital soft tissues. Other: Chronic nasal bone fracture deformities. Asymmetrical thickening and soft tissue calcification of the right ear, including the helix and ear lobe, not significantly changed in size or contours. Pneumatized bilateral mastoid air cells and middle ears. IMPRESSION: No CT evidence of acute intracranial hemorrhage or edema. 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 Interval development of small bilateral convexity CSF-density hygromas, new since July 2020 and measuring up to 7 mm in thickness. Asymmetrical thickening and soft tissue calcification of the right ear, not significantly changed in size or contours compared to July 2020, and  possibly benign or malignant. Dermatology consultation could be considered. Mild bilateral ethmoid and maxillary paranasal sinus mucosal disease. Electronically Signed   By: Revonda Humphrey   On: 10/21/2019 16:59   CT ABDOMEN PELVIS W CONTRAST  Result Date: 10/27/2019 CLINICAL DATA:  54 year old male with history of worsening in the media and weight loss. EXAM: CT ABDOMEN AND PELVIS WITH CONTRAST TECHNIQUE: Multidetector CT imaging of the abdomen and pelvis was performed using the standard protocol following bolus administration of intravenous contrast. CONTRAST:  182m OMNIPAQUE IOHEXOL 300 MG/ML  SOLN COMPARISON:  CT the abdomen and pelvis 07/10/2013. FINDINGS: Lower chest: Small bilateral pleural effusions lying dependently with some associated passive subsegmental atelectasis in the lower lobes of the lungs bilaterally. Extensive nodularity noted in the posterior aspect of the left lower lobe (axial image 1 of series 7) measuring 1.9 x 1.5 cm. Atherosclerotic calcifications in the left main, left anterior descending, left circumflex and right coronary arteries. Hepatobiliary: No suspicious cystic or solid hepatic lesions. No intra or extrahepatic biliary ductal dilatation. Gallbladder is normal in appearance. Pancreas: Widespread coarse calcifications scattered throughout the pancreatic parenchyma, along with diffuse pancreatic atrophy, sequela of chronic pancreatitis. The largest of these calcifications are in the pancreatic head measuring up to 1.2 x 0.8 cm (axial image 31 of series 2), potentially within the duct. Diffuse ductal dilatation measuring up to 9 mm in the body of the pancreas. No peripancreatic fluid collections or inflammatory changes. Spleen: Unremarkable. Adrenals/Urinary Tract: There are multiple calculi in the collecting systems of both kidneys, most of which appear nonobstructive, measuring up to 9 mm in the lower pole collecting system of the right kidney. In addition, in the interpolar  collecting system of the left kidney there is a large calculus measuring 1.0 x 1.8 x 1.2 cm which is associated with some caliectasis throughout the left renal collecting system, indicating mild obstruction. No hydroureter. Urinary bladder is normal in appearance. Bilateral adrenal glands are normal in appearance. Stomach/Bowel: Unenhanced appearance of the stomach is normal. No pathologic dilatation of small bowel or colon. Large volume of stool throughout the colon and rectum may suggest constipation. Vascular/Lymphatic: Aortic atherosclerosis, without evidence of aneurysm or dissection in the abdominal or pelvic vasculature. No lymphadenopathy noted in the abdomen or pelvis. Reproductive: Prostate gland and seminal vesicles are unremarkable in appearance. Other: No significant volume of ascites.  No pneumoperitoneum. Musculoskeletal: There are no aggressive appearing lytic or blastic lesions noted in the visualized portions of the skeleton. IMPRESSION: 1. 1.0 x 1.8 x 1.2 cm calculus in the interpolar collecting system of left kidney which appears mildly obstructive as evidenced by mild diffuse caliectasis throughout the left kidney. 2. Multiple additional nonobstructive calculi in both renal collecting systems measuring up to 9 mm in the lower pole collecting system of the right kidney. 3. Morphologic changes  in the pancreas compatible with chronic pancreatitis. Probable obstructing ductal calculus in the pancreatic head with diffuse pancreatic ductal dilatation. 4. Extensive clustered nodularity lying dependently in the left lower lobe. The appearance is suggestive of infection/inflammation, potentially related to prior aspiration. However, close attention on follow-up noncontrast chest CT is recommended within 1 month to ensure the resolution of this finding, as neoplasm is not favored but difficult to entirely exclude. 5. Small bilateral pleural effusions lying dependently. 6. Aortic atherosclerosis, in  addition to left main and 3 vessel coronary artery disease. Please note that although the presence of coronary artery calcium documents the presence of coronary artery disease, the severity of this disease and any potential stenosis cannot be assessed on this non-gated CT examination. Assessment for potential risk factor modification, dietary therapy or pharmacologic therapy may be warranted, if clinically indicated. Electronically Signed   By: Vinnie Langton M.D.   On: 10/27/2019 11:53   DG Chest Port 1 View  Result Date: 11/14/2019 CLINICAL DATA:  Hypoglycemia. EXAM: PORTABLE CHEST 1 VIEW COMPARISON:  10/21/2019 and 02/19/2019 chest radiograph. FINDINGS: Mild lung hypoinflation.  No focal consolidation. No pneumothorax or pleural effusion. Cardiomediastinal silhouette is unchanged. Osseous structures are unremarkable. IMPRESSION: No acute airspace disease. Electronically Signed   By: Primitivo Gauze M.D.   On: 11/14/2019 09:54   DG Chest Portable 1 View  Result Date: 10/21/2019 CLINICAL DATA:  Weakness, dizziness and hyperglycemia. EXAM: PORTABLE CHEST 1 VIEW COMPARISON:  02/19/2019 FINDINGS: The heart size and mediastinal contours are within normal limits. Both lungs are clear. The visualized skeletal structures are unremarkable. IMPRESSION: No active disease. Electronically Signed   By: Nelson Chimes M.D.   On: 10/21/2019 14:52   ECHOCARDIOGRAM COMPLETE  Result Date: 10/22/2019    ECHOCARDIOGRAM REPORT   Patient Name:   Adir Newman Date of Exam: 10/22/2019 Medical Rec #:  [id number]        Height:       68.0 in Accession #:    [id number]       Weight:       97.7 lb Date of Birth:  May 19, 1966        BSA:          1.507 m Patient Age:    61 years         BP:           142/86 mmHg Patient Gender: M                HR:           67 bpm. Exam Location:  Inpatient Procedure: 2D Echo Indications:    CHF-Acute Diastolic 476.54 / Y50.35  History:        Patient has prior history of Echocardiogram  examinations, most                 recent 05/20/2019. Risk Factors:Tobacco use and Diabetes. Acute                 kidney injury                 Substance use disorder                 Alcohol use.  Sonographer:    Vikki Ports Turrentine Referring Phys: [id number] [place]  1. Left ventricular ejection fraction, by estimation, is 40 to 45%. The left ventricle has mildly decreased function. The left ventricle demonstrates global hypokinesis. Left ventricular diastolic parameters are consistent with Grade  I diastolic dysfunction (impaired relaxation).  2. Right ventricular systolic function is normal. The right ventricular size is normal. Tricuspid regurgitation signal is inadequate for assessing PA pressure.  3. The mitral valve is normal in structure. No evidence of mitral valve regurgitation.  4. The aortic valve is tricuspid. Aortic valve regurgitation is trivial. No aortic stenosis is present. There is a small filamentous mobile echodensity on ventricular side of right coronary cusp, unchanged from prior TTE on 05/15/19  5. The inferior vena cava is normal in size with <50% respiratory variability, suggesting right atrial pressure of 8 mmHg. FINDINGS  Left Ventricle: Left ventricular ejection fraction, by estimation, is 40 to 45%. The left ventricle has mildly decreased function. The left ventricle demonstrates global hypokinesis. The left ventricular internal cavity size was normal in size. There is  no left ventricular hypertrophy. Left ventricular diastolic parameters are consistent with Grade I diastolic dysfunction (impaired relaxation). Right Ventricle: The right ventricular size is normal. No increase in right ventricular wall thickness. Right ventricular systolic function is normal. Tricuspid regurgitation signal is inadequate for assessing PA pressure. Left Atrium: Left atrial size was normal in size. Right Atrium: Right atrial size was normal in size. Prominent Chiari network. Pericardium: A  small pericardial effusion is present. Mitral Valve: The mitral valve is normal in structure. No evidence of mitral valve regurgitation. Tricuspid Valve: The tricuspid valve is normal in structure. Tricuspid valve regurgitation is trivial. Aortic Valve: The aortic valve is tricuspid. Aortic valve regurgitation is trivial. No aortic stenosis is present. Pulmonic Valve: The pulmonic valve was not well visualized. Pulmonic valve regurgitation is not visualized. Aorta: The aortic root is normal in size and structure. Venous: The inferior vena cava is normal in size with less than 50% respiratory variability, suggesting right atrial pressure of 8 mmHg. IAS/Shunts: No atrial level shunt detected by color flow Doppler.  LEFT VENTRICLE PLAX 2D LVIDd:         4.50 cm     Diastology LVIDs:         3.90 cm     LV e' lateral:   7.28 cm/s LV PW:         0.80 cm     LV E/e' lateral: 7.2 LV IVS:        0.80 cm     LV e' medial:    5.03 cm/s LVOT diam:     2.00 cm     LV E/e' medial:  10.5 LV SV:         55 LV SV Index:   36 LVOT Area:     3.14 cm  LV Volumes (MOD) LV vol d, MOD A2C: 60.6 ml LV vol d, MOD A4C: 82.8 ml LV vol s, MOD A2C: 34.2 ml LV vol s, MOD A4C: 45.9 ml LV SV MOD A2C:     26.4 ml LV SV MOD A4C:     82.8 ml LV SV MOD BP:      31.9 ml RIGHT VENTRICLE RV S prime:     8.24 cm/s TAPSE (M-mode): 2.0 cm LEFT ATRIUM           Index       RIGHT ATRIUM           Index LA diam:      3.00 cm 1.99 cm/m  RA Area:     10.90 cm LA Vol (A4C): 22.8 ml 15.13 ml/m RA Volume:   22.60 ml  15.00 ml/m  AORTIC VALVE LVOT Vmax:  73.80 cm/s LVOT Vmean:  55.500 cm/s LVOT VTI:    0.174 m  AORTA Ao Root diam: 3.10 cm MITRAL VALVE MV Area (PHT): 3.03 cm    SHUNTS MV Decel Time: 250 msec    Systemic VTI:  0.17 m MV E velocity: 52.70 cm/s  Systemic Diam: 2.00 cm MV A velocity: 69.80 cm/s MV E/A ratio:  0.76 Oswaldo Milian MD Electronically signed by Oswaldo Milian MD Signature Date/Time: 10/22/2019/3:41:34 PM    Final      Subjective: No acute issues or events overnight, patient now tolerating p.o. well, ambulating without difficulty back at baseline, otherwise denies nausea, vomiting, diarrhea, constipation, headache, fevers, chills.   Discharge Exam: Vitals:   11/14/19 2046 11/15/19 0530  BP: 140/77 140/77  Pulse: 78 80  Resp: 18 17  Temp: 99 F (37.2 C) 98.8 F (37.1 C)  SpO2: 100% 96%   Vitals:   11/14/19 1727 11/14/19 2046 11/15/19 0530 11/15/19 0612  BP:  140/77 140/77   Pulse:  78 80   Resp:  18 17   Temp:  99 F (37.2 C) 98.8 F (37.1 C)   TempSrc:  Oral Oral   SpO2:  100% 96%   Weight: 57.4 kg   57.7 kg  Height: 5' 8"  (1.727 m)       General:  Pleasantly resting in bed, No acute distress. HEENT:  Normocephalic atraumatic.  Sclerae nonicteric, noninjected.  Extraocular movements intact bilaterally. Neck:  Without mass or deformity.  Trachea is midline. Lungs:  Clear to auscultate bilaterally without rhonchi, wheeze, or rales. Heart:  Regular rate and rhythm.  Without murmurs, rubs, or gallops. Abdomen:  Soft, nontender, nondistended.  Without guarding or rebound. Extremities: Without cyanosis, clubbing, edema, or obvious deformity. Vascular:  Dorsalis pedis and posterior tibial pulses palpable bilaterally. Skin:  Warm and dry, no erythema, no ulcerations.   The results of significant diagnostics from this hospitalization (including imaging, microbiology, ancillary and laboratory) are listed below for reference.     Microbiology: Recent Results (from the past 240 hour(s))  Culture, blood (Routine X 2) w Reflex to ID Panel     Status: None (Preliminary result)   Collection Time: 11/14/19  9:35 AM   Specimen: BLOOD  Result Value Ref Range Status   Specimen Description   Final    BLOOD RIGHT ANTECUBITAL Performed at [facility] [address], [place], [place] [address]    Special Requests   Final    BOTTLES DRAWN AEROBIC AND ANAEROBIC Blood Culture  results may not be optimal due to an inadequate volume of blood received in culture bottles Performed at [facility] [address], [place], [place] [address]    Culture   Final    NO GROWTH 1 DAY Performed at [facility], [place] [address] [place], [place] [address]    Report Status PENDING  Incomplete  Culture, blood (Routine X 2) w Reflex to ID Panel     Status: None (Preliminary result)   Collection Time: 11/14/19  9:51 AM   Specimen: BLOOD  Result Value Ref Range Status   Specimen Description   Final    BLOOD RIGHT ARM Performed at [facility] [address], [place], [place] [address]    Special Requests   Final    BOTTLES DRAWN AEROBIC AND ANAEROBIC Blood Culture adequate volume Performed at [facility] [address], [place], [place] [address]    Culture   Final  NO GROWTH 1 DAY Performed at [facility], [place] [address] [place], [place] [address]    Report Status PENDING  Incomplete  Respiratory Panel by RT PCR (Flu A&B, Covid) - Nasopharyngeal Swab     Status: None   Collection Time: 11/14/19  9:51 AM   Specimen: Nasopharyngeal Swab  Result Value Ref Range Status   SARS Coronavirus 2 by RT PCR NEGATIVE NEGATIVE Final    Comment: (NOTE) SARS-CoV-2 target nucleic acids are NOT DETECTED. The SARS-CoV-2 RNA is generally detectable in upper respiratoy specimens during the acute phase of infection. The lowest concentration of SARS-CoV-2 viral copies this assay can detect is 131 copies/mL. A negative result does not preclude SARS-Cov-2 infection and should not be used as the sole basis for treatment or other patient management decisions. A negative result may occur with  improper specimen collection/handling, submission of specimen other than nasopharyngeal swab, presence of viral mutation(s) within the areas targeted by this assay, and inadequate number of viral copies (<131 copies/mL). A  negative result must be combined with clinical observations, patient history, and epidemiological information. The expected result is Negative. Fact Sheet for Patients:  PinkCheek.be Fact Sheet for Healthcare Providers:  GravelBags.it This test is not yet ap proved or cleared by the [place] FDA and  has been authorized for detection and/or diagnosis of SARS-CoV-2 by FDA under an Emergency Use Authorization (EUA). This EUA will remain  in effect (meaning this test can be used) for the duration of the COVID-19 declaration under Section 564(b)(1) of the Act, 21 U.S.C. section 360bbb-3(b)(1), unless the authorization is terminated or revoked sooner.    Influenza A by PCR NEGATIVE NEGATIVE Final   Influenza B by PCR NEGATIVE NEGATIVE Final    Comment: (NOTE) The Xpert Xpress SARS-CoV-2/FLU/RSV assay is intended as an aid in  the diagnosis of influenza from Nasopharyngeal swab specimens and  should not be used as a sole basis for treatment. Nasal washings and  aspirates are unacceptable for Xpert Xpress SARS-CoV-2/FLU/RSV  testing. Fact Sheet for Patients: PinkCheek.be Fact Sheet for Healthcare Providers: GravelBags.it This test is not yet approved or cleared by the [place] FDA and  has been authorized for detection and/or diagnosis of SARS-CoV-2 by  FDA under an Emergency Use Authorization (EUA). This EUA will remain  in effect (meaning this test can be used) for the duration of the  Covid-19 declaration under Section 564(b)(1) of the Act, 21  U.S.C. section 360bbb-3(b)(1), unless the authorization is  terminated or revoked. Performed at [facility], [place] [address] [place], [place] [address]   Urine Culture     Status: Abnormal (Preliminary result)   Collection Time: 11/14/19 10:05 AM   Specimen: Urine, Random  Result Value Ref Range  Status   Specimen Description   Final    URINE, RANDOM Performed at [facility], [place] [address] [place], [place] [address]    Special Requests   Final    NONE Performed at [facility], [place] [address] [place], [place] [address]    Culture (A)  Final    >=100,000 COLONIES/mL STAPHYLOCOCCUS AUREUS CULTURE REINCUBATED FOR BETTER GROWTH SUSCEPTIBILITIES TO FOLLOW Performed at [facility], [place] [address], [place], [place] [address]    Report Status PENDING  Incomplete     Labs: BNP (last 3 results) Recent Labs    10/21/19 1434  BNP 824.2*   Basic Metabolic Panel: Recent Labs  Lab 11/14/19 0935 11/14/19  7829 11/15/19 0656  NA 139 139 136  K 3.8 3.7 5.0  CL 111 108 105  CO2 23  --  24  GLUCOSE 116* 109* 286*  BUN 12 11 14   CREATININE 0.60* 0.50* 0.62  CALCIUM 8.0*  --  7.8*   Liver Function Tests: Recent Labs  Lab 11/14/19 0935  AST 42*  ALT 39  ALKPHOS 224*  BILITOT 0.3  PROT 5.9*  ALBUMIN 2.7*   No results for input(s): LIPASE, AMYLASE in the last 168 hours. No results for input(s): AMMONIA in the last 168 hours. CBC: Recent Labs  Lab 11/14/19 0935 11/14/19 0944 11/15/19 0656  WBC 8.1  --  5.9  NEUTROABS 6.2  --   --   HGB 8.9* 8.8* 7.6*  HCT 28.7* 26.0* 24.2*  MCV 97.3  --  96.8  PLT 346  --  312   Cardiac Enzymes: No results for input(s): CKTOTAL, CKMB, CKMBINDEX, TROPONINI in the last 168 hours. BNP: Invalid input(s): POCBNP CBG: Recent Labs  Lab 11/14/19 1535 11/14/19 1703 11/14/19 2133 11/15/19 0732 11/15/19 1208  GLUCAP 239* 202* 269* 265* 260*   D-Dimer No results for input(s): DDIMER in the last 72 hours. Hgb A1c Recent Labs    11/14/19 1709  HGBA1C 13.9*   Lipid Profile No results for input(s): CHOL, HDL, LDLCALC, TRIG, CHOLHDL, LDLDIRECT in the last 72 hours. Thyroid function studies No results for input(s): TSH, T4TOTAL, T3FREE, THYROIDAB in the last 72 hours.  Invalid  input(s): FREET3 Anemia work up No results for input(s): VITAMINB12, FOLATE, FERRITIN, TIBC, IRON, RETICCTPCT in the last 72 hours. Urinalysis    Component Value Date/Time   COLORURINE YELLOW 11/14/2019 0935   APPEARANCEUR CLOUDY (A) 11/14/2019 0935   LABSPEC 1.011 11/14/2019 0935   PHURINE 5.0 11/14/2019 0935   GLUCOSEU 50 (A) 11/14/2019 0935   HGBUR LARGE (A) 11/14/2019 0935   BILIRUBINUR NEGATIVE 11/14/2019 0935   BILIRUBINUR negative 03/15/2018 1049   BILIRUBINUR negative 12/26/2017 0941   KETONESUR NEGATIVE 11/14/2019 0935   PROTEINUR >=300 (A) 11/14/2019 0935   UROBILINOGEN 0.2 03/15/2018 1049   UROBILINOGEN 0.2 12/08/2010 1613   NITRITE POSITIVE (A) 11/14/2019 0935   LEUKOCYTESUR LARGE (A) 11/14/2019 0935   Sepsis Labs Invalid input(s): PROCALCITONIN,  WBC,  LACTICIDVEN Microbiology Recent Results (from the past 240 hour(s))  Culture, blood (Routine X 2) w Reflex to ID Panel     Status: None (Preliminary result)   Collection Time: 11/14/19  9:35 AM   Specimen: BLOOD  Result Value Ref Range Status   Specimen Description   Final    BLOOD RIGHT ANTECUBITAL Performed at [facility], [place] [address], [place], [place] [address]    Special Requests   Final    BOTTLES DRAWN AEROBIC AND ANAEROBIC Blood Culture results may not be optimal due to an inadequate volume of blood received in culture bottles Performed at [place] [address], [place], [place] [address]    Culture   Final    NO GROWTH 1 DAY Performed at [facility], [place] [address] [place],  [address]    Report Status PENDING  Incomplete  Culture, blood (Routine X 2) w Reflex to ID Panel     Status: None (Preliminary result)   Collection Time: 11/14/19  9:51 AM   Specimen: BLOOD  Result Value Ref Range Status   Specimen Description   Final    BLOOD RIGHT ARM Performed at [facility], [address] [place], [place] [address]     Special Requests   Final    BOTTLES DRAWN AEROBIC AND ANAEROBIC Blood Culture adequate volume Performed at [place] [address], [place], [place] [address]    Culture   Final    NO GROWTH 1 DAY Performed at [facility], [place] [address], [place], [place] [address]    Report Status PENDING  Incomplete  Respiratory Panel by RT PCR (Flu A&B, Covid) - Nasopharyngeal Swab     Status: None   Collection Time: 11/14/19  9:51 AM   Specimen: Nasopharyngeal Swab  Result Value Ref Range Status   SARS Coronavirus 2 by RT PCR NEGATIVE NEGATIVE Final    Comment: (NOTE) SARS-CoV-2 target nucleic acids are NOT DETECTED. The SARS-CoV-2 RNA is generally detectable in upper respiratoy specimens during the acute phase of infection. The lowest concentration of SARS-CoV-2 viral copies this assay can detect is 131 copies/mL. A negative result does not preclude SARS-Cov-2 infection and should not be used as the sole basis for treatment or other patient management decisions. A negative result may occur with  improper specimen collection/handling, submission of specimen other than nasopharyngeal swab, presence of viral mutation(s) within the areas targeted by this assay, and inadequate number of viral copies (<131 copies/mL). A negative result must be combined with clinical observations, patient history, and epidemiological information. The expected result is Negative. Fact Sheet for Patients:  PinkCheek.be Fact Sheet for Healthcare Providers:  GravelBags.it This test is not yet ap proved or cleared by the [place] FDA and  has been authorized for detection and/or diagnosis of SARS-CoV-2 by FDA under an Emergency Use Authorization (EUA). This EUA will remain  in effect (meaning this test can be used) for the duration of the COVID-19 declaration under Section 564(b)(1) of the Act, 21 U.S.C. section 360bbb-3(b)(1),  unless the authorization is terminated or revoked sooner.    Influenza A by PCR NEGATIVE NEGATIVE Final   Influenza B by PCR NEGATIVE NEGATIVE Final    Comment: (NOTE) The Xpert Xpress SARS-CoV-2/FLU/RSV assay is intended as an aid in  the diagnosis of influenza from Nasopharyngeal swab specimens and  should not be used as a sole basis for treatment. Nasal washings and  aspirates are unacceptable for Xpert Xpress SARS-CoV-2/FLU/RSV  testing. Fact Sheet for Patients: PinkCheek.be Fact Sheet for Healthcare Providers: GravelBags.it This test is not yet approved or cleared by the [place] FDA and  has been authorized for detection and/or diagnosis of SARS-CoV-2 by  FDA under an Emergency Use Authorization (EUA). This EUA will remain  in effect (meaning this test can be used) for the duration of the  Covid-19 declaration under Section 564(b)(1) of the Act, 21  U.S.C. section 360bbb-3(b)(1), unless the authorization is  terminated or revoked. Performed at [facility], [place] [address] [place], [place] [address]   Urine Culture     Status: Abnormal (Preliminary result)   Collection Time: 11/14/19 10:05 AM   Specimen: Urine, Random  Result Value Ref Range Status   Specimen Description   Final    URINE, RANDOM Performed at [facility], [place] [address], [place], [place] [address]    Special Requests   Final    NONE Performed at [facility], [place] [address], [place], [place] [address]    Culture (A)  Final    >=100,000 COLONIES/mL STAPHYLOCOCCUS AUREUS CULTURE REINCUBATED FOR BETTER GROWTH SUSCEPTIBILITIES TO FOLLOW Performed at [facility]  [facility], [place] [address] [place], [place] [address]    Report Status PENDING  Incomplete     Time coordinating discharge: Over 30 minutes  SIGNED:   Little Ishikawa, DO [facility] 11/15/2019, 12:13 PM Pager    If 7PM-7AM, please contact night-coverage [email or web address]

## 2019-11-15 NOTE — Progress Notes (Signed)
Inpatient Diabetes Program Recommendations  AACE/ADA: New Consensus Statement on Inpatient Glycemic Control (2015)  Target Ranges:  Prepandial:   less than 140 mg/dL      Peak postprandial:   less than 180 mg/dL (1-2 hours)      Critically ill patients:  140 - 180 mg/dL   Lab Results  Component Value Date   GLUCAP 265 (H) 11/15/2019   HGBA1C 13.9 (H) 11/14/2019    Review of Glycemic Control Results for TRAVONTA, GILL (MRN [id number]) as of 11/15/2019 09:00  Ref. Range 11/14/2019 15:35 11/14/2019 17:03 11/14/2019 21:33 11/15/2019 07:32  Glucose-Capillary Latest Ref Range: 70 - 99 mg/dL 481 (H) 859 (H) 093 (H) 265 (H)   Diabetes history: DM2 Outpatient Diabetes medications: None Current orders for Inpatient glycemic control: Novolog 0-6 units tid  HgbA1C - > 18.5%  Inpatient Diabetes Program Recommendations:     Glucose trends 200's consistently  Consider adding Levemir 6 units bid   Follow closely.  Thank you. Christena Deem RN, MSN, BC-ADM Inpatient Diabetes Coordinator Team Pager [phone] (8a-5p)

## 2019-11-15 NOTE — TOC Progression Note (Signed)
Transition of Care [facility]) - Progression Note    Patient Details  Name: Rodney Barber MRN: [id number] Date of Birth: 10/27/1965  Transition of [facility]) CM/SW Contact  Armanda Heritage, RN Phone Number: 11/15/2019, 3:59 PM  Clinical Narrative:   Patient provided with bus pass.    Expected Discharge Plan: Homeless Shelter Barriers to Discharge: No Barriers Identified  Expected Discharge Plan and Services Expected Discharge Plan: Homeless Shelter         Expected Discharge Date: 11/15/19                                     Social Determinants of Health (SDOH) Interventions    Readmission Risk Interventions Readmission Risk Prevention Plan 10/27/2019 06/24/2019 02/26/2019  Transportation Screening Complete Complete Complete  Medication Review Oceanographer) Complete Complete Complete  PCP or Specialist appointment within 3-5 days of discharge - Complete Complete  HRI or Home Care Consult Complete Complete Complete  SW Recovery Care/Counseling Consult Complete Complete Complete  Palliative Care Screening Not Applicable Not Applicable Complete  Skilled Nursing Facility Not Applicable Not Applicable -  Some recent data might be hidden

## 2019-11-15 NOTE — Progress Notes (Signed)
Have taken pt to front lobby to wait for rain to dry and pt to walk to bus stop. Pt upset about having to leave hospital in rain with no shoes. Hospital paper clothes on pt. Multiple pairs of socks given to pt to change if pairs get wet. Pt has 2 bus passes and should be returning to [facility].

## 2019-11-16 LAB — URINE CULTURE: Culture: >=100000 — AB

## 2019-11-19 LAB — CULTURE, BLOOD (ROUTINE X 2)
Culture: NO GROWTH
Culture: NO GROWTH
Special Requests: ADEQUATE

## 2019-11-26 ENCOUNTER — Encounter: Payer: Self-pay | Admitting: Critical Care Medicine

## 2019-11-26 ENCOUNTER — Other Ambulatory Visit: Payer: Self-pay | Admitting: Critical Care Medicine

## 2019-11-26 MED ORDER — ATORVASTATIN CALCIUM 20 MG PO TABS: 20.0000 mg | ORAL_TABLET | Freq: Every day | ORAL | 0 refills | Status: DC

## 2019-11-26 MED ORDER — AMLODIPINE BESYLATE 10 MG PO TABS: 10.0000 mg | ORAL_TABLET | Freq: Every day | ORAL | 0 refills | Status: DC

## 2019-11-26 MED ORDER — LISINOPRIL 20 MG PO TABS: 20.0000 mg | ORAL_TABLET | Freq: Every day | ORAL | 0 refills | Status: DC

## 2019-11-26 MED ORDER — LEVEMIR FLEXTOUCH 100 UNIT/ML ~~LOC~~ SOPN: 25.0000 [IU] | PEN_INJECTOR | Freq: Every day | SUBCUTANEOUS | 11 refills | Status: DC

## 2019-11-26 MED FILL — LISINOPRIL 20 MG TABLET: 20 | 30 days supply | Qty: 30 | Fill #0

## 2019-11-26 MED FILL — ATORVASTATIN 20 MG TABLET: 20 | 30 days supply | Qty: 30 | Fill #0

## 2019-11-26 MED FILL — AMLODIPINE BESYLATE 10 MG T: 10 | 30 days supply | Qty: 30 | Fill #0

## 2019-11-26 NOTE — Progress Notes (Signed)
See note

## 2019-11-26 NOTE — Progress Notes (Signed)
Patient ID: [name], male   DOB: Aug 30, 1965, 54 y.o.   MRN: [id number] This is a 54 year old male who seen previously in the [facility] comes in today with recent hospitalization for hypoglycemia we was on 35 units of Levemir with not eating regularly.  Note he has not been using his pancreatic enzyme medicine regularly.  Also he is missed doses of lisinopril and amlodipine.  On exam blood pressure 186/94 glucose is greater than 400  Patient is thin and in no acute distress chest was clear abdomen was slightly tender extremities showed no edema  Impression is that of type 2 diabetes poorly controlled secondary to poor dietary habits  Also hypertension poorly controlled at this time  Plan is to refill amlodipine 10 mg daily lisinopril 20 mg daily and atorvastatin 20 mg daily the patient will increase Levemir to 25 units daily we will attempt to get this patient back into our health and wellness clinic for further follow-up and lab draws

## 2019-11-26 NOTE — Congregational Nurse Program (Signed)
Rodney Barber was seen at [facility] walk-in clinic for diabetes management. F/S not able to be read as High on glucometer. Insulin management adjusted by Dr. [name]. Will f/u at [facility] in 2 weeks.

## 2019-12-10 ENCOUNTER — Encounter: Payer: Self-pay | Admitting: Pharmacist

## 2019-12-10 ENCOUNTER — Encounter: Payer: Self-pay | Admitting: Critical Care Medicine

## 2019-12-10 ENCOUNTER — Other Ambulatory Visit: Payer: Self-pay

## 2019-12-10 ENCOUNTER — Ambulatory Visit (HOSPITAL_BASED_OUTPATIENT_CLINIC_OR_DEPARTMENT_OTHER): Payer: Self-pay | Admitting: Pharmacist

## 2019-12-10 ENCOUNTER — Ambulatory Visit: Payer: Self-pay | Attending: Critical Care Medicine | Admitting: Critical Care Medicine

## 2019-12-10 VITALS — Ht 68.0 in | Wt 137.0 lb

## 2019-12-10 DIAGNOSIS — E43 Unspecified severe protein-calorie malnutrition: Secondary | ICD-10-CM

## 2019-12-10 DIAGNOSIS — E1165 Type 2 diabetes mellitus with hyperglycemia: Secondary | ICD-10-CM

## 2019-12-10 DIAGNOSIS — K860 Alcohol-induced chronic pancreatitis: Secondary | ICD-10-CM

## 2019-12-10 DIAGNOSIS — Z23 Encounter for immunization: Secondary | ICD-10-CM

## 2019-12-10 DIAGNOSIS — R7401 Elevation of levels of liver transaminase levels: Secondary | ICD-10-CM

## 2019-12-10 LAB — GLUCOSE, POCT (MANUAL RESULT ENTRY): POC Glucose: 396 mg/dl — AB (ref 70–99)

## 2019-12-10 MED ORDER — AMLODIPINE BESYLATE 10 MG PO TABS: 10.0000 mg | ORAL_TABLET | Freq: Every day | ORAL | 1 refills | Status: DC

## 2019-12-10 MED ORDER — PANCRELIPASE (LIP-PROT-AMYL) 12000-38000 UNITS PO CPEP: 12000.0000 [IU] | ORAL_CAPSULE | Freq: Three times a day (TID) | ORAL | 1 refills | Status: DC

## 2019-12-10 MED ORDER — LISINOPRIL 20 MG PO TABS: 20.0000 mg | ORAL_TABLET | Freq: Every day | ORAL | 1 refills | Status: DC

## 2019-12-10 MED ORDER — ZENPEP 20000-63000 UNITS PO CPEP: 1.0000 | ORAL_CAPSULE | Freq: Three times a day (TID) | ORAL | 1 refills | Status: DC

## 2019-12-10 MED ORDER — LEVEMIR FLEXTOUCH 100 UNIT/ML ~~LOC~~ SOPN: 15.0000 [IU] | PEN_INJECTOR | Freq: Two times a day (BID) | SUBCUTANEOUS | 11 refills | Status: DC

## 2019-12-10 MED ORDER — ATORVASTATIN CALCIUM 20 MG PO TABS: 20.0000 mg | ORAL_TABLET | Freq: Every day | ORAL | 1 refills | Status: DC

## 2019-12-10 MED FILL — !ZENPEP 20000-63000 UNIT CA: 20000-63000 | 33 days supply | Qty: 100 | Fill #0

## 2019-12-10 MED FILL — !LEVEMIR FLEXTOUCH 100 UNIT: 100 | 30 days supply | Qty: 9 | Fill #0

## 2019-12-10 NOTE — Assessment & Plan Note (Signed)
Plan to follow-up liver function

## 2019-12-10 NOTE — Progress Notes (Signed)
Subjective:    Patient ID: Rodney Barber, male    DOB: 10/02/1965, 54 y.o.   MRN: [id number]  This is a 54 year old male who seen previously in the [place] clinic comes in today with recent hospitalization for hypoglycemia we was on 35 units of Levemir with not eating regularly.  Note he has not been using his pancreatic enzyme medicine regularly.  Also he is missed doses of lisinopril and amlodipine.  On exam blood pressure 186/94 glucose is greater than 400  Patient is thin and in no acute distress chest was clear abdomen was slightly tender extremities showed no edema  Impression is that of type 2 diabetes poorly controlled secondary to poor dietary habits  Also hypertension poorly controlled at this time  12/10/2019 This patient is seen for the first time in the community health and wellness clinic.  He previously been followed intermittently at the [facility] clinic.  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  He is also had very poor dietary habits and skips meals at times.  He had an episode of severe hypoglycemia on 30 units of Levemir daily and was admitted between the ninth and 10 April.  Below is the discharge summary.  Admitted early April hypoglycemia   Admit date: 11/14/2019 Discharge date: 11/15/2019  Admitted From: Shelter Disposition: Shelter  Recommendations for Outpatient Follow-up:  1. Follow up with PCP in 1-2 weeks 2. Please obtain BMP/CBC in one week  Discharge Condition: Stable CODE STATUS: Full Diet recommendation: Diabetic diet as tolerated  Brief/Interim Summary: Rodney Barber a 54 y.o.malewith medical history significant oftype 2 diabetes/IDDM, hypertension, pancreatitis, tobacco use, and homelessness who presents with hypoglycemia. Patient is alert and oriented at present but unable to give great recollection of this morning. He states he  recalls speaking with EMS and understands he is now at [facility] because of the low blood sugar. He states he was in usual state of health through last night and took his insulin in the evening as he normally does. However he cannot tell me what dose of insulin he takes. However he does state that he does not normally get a low blood sugar and has not had issues like this throughout the week. He denies any fevers or chills. He denies any new rashes, urinary symptoms, nausea vomiting or diarrhea. He reports he is otherwise taking his meds without assistance. He denies any other symptoms, no cough/shortness of breath, chest pain. Per EMS report, patient was altered and noted to be hypoglycemic to 35. He received D10, 250 cc in route and in the emergency room was again noted to be hypoglycemic to 30s. Patient reports using tobacco, approximately half pack per day. No alcohol, no drugs. He reports ambulating with the assistance of a walker, denies any dizziness lightheadedness or falls.  Patient admitted as above with acute symptomatic hypoglycemia in the setting of medication and dietary noncompliance.  Patient is more awake alert oriented this morning, we discussed his insulin regimen at length.  Patient declines any urinary symptoms, antibiotics will be discontinued appropriately.  It appears he has not been using his blood glucose monitor as he has run out of test strips and lancets, because of this he has been dosing his insulin regularly likely the reason for his A1c being quite elevated but being admitted for acute symptomatic hypoglycemia.  Given concern for ongoing hypoglycemia patient's Lantus has been decreased to 20 units nightly, discussed  that he needs to maintain his diabetic diet and take this medication every night, follow his Accu-Cheks as scheduled and follow-up with his PCP in the next 3 to 4 days.  He indicates his last office visit was supposed to be Wednesday but due to  transportation issues not being provided he was unable to point to their office.  Discussed that should he continue to be noncompliant or use medications inappropriately he had a markedly increased risk of death given his profound hypoglycemia.  Patient understood the risks of not continued medical compliance follow-up and medication administrations.  Patient is otherwise stable and agreeable for discharge home, ambulating without difficulty, tolerating p.o. quite well and glucose remains somewhat well controlled although minimally elevated in the setting of dextrose and hypoglycemic bolusing overnight.   Discharge Diagnoses:  Principal Problem:   Hypoglycemia Active Problems:   Severe malnutrition (HCC)   Protein-calorie malnutrition, severe (HCC)   Altered mental status   AMS (altered mental status)   Noncompliance with medications  Patient comes in today and still is running high blood sugars today his CBG is 340.  He has no longer drinking.  He is not eating healthy foods.  He is trying to eat some protein vegetables and occasionally bananas.  He also has severe dental issues with significant dental caries and periodontal disease.     Past Medical History:  Diagnosis Date  . Diabetes mellitus   . Hypertension   . Pancreatitis      Family History  Problem Relation Age of Onset  . Diabetes Mother   . Diabetes Brother      Social History   Socioeconomic History  . Marital status: Single    Spouse name: Not on file  . Number of children: Not on file  . Years of education: Not on file  . Highest education level: Not on file  Occupational History  . Not on file  Tobacco Use  . Smoking status: Current Every Day Smoker    Packs/day: 0.50    Years: 30.00    Pack years: 15.00    Types: Cigarettes  . Smokeless tobacco: Never Used  . Tobacco comment: 6 cigarett /day  Substance and Sexual Activity  . Alcohol use: No  . Drug use: Not Currently    Types: "Crack" cocaine,  Other-see comments, Cocaine    Comment: last smoked crack 12-16-16  . Sexual activity: Never  Other Topics Concern  . Not on file  Social History Narrative  . Not on file   Social Determinants of Health   Financial Resource Strain:   . Difficulty of Paying Living Expenses:   Food Insecurity:   . Worried About Charity fundraiser in the Last Year:   . Arboriculturist in the Last Year:   Transportation Needs:   . Film/video editor (Medical):   Marland Kitchen Lack of Transportation (Non-Medical):   Physical Activity:   . Days of Exercise per Week:   . Minutes of Exercise per Session:   Stress:   . Feeling of Stress :   Social Connections:   . Frequency of Communication with Friends and Family:   . Frequency of Social Gatherings with Friends and Family:   . Attends Religious Services:   . Active Member of Clubs or Organizations:   . Attends Archivist Meetings:   Marland Kitchen Marital Status:   Intimate Partner Violence:   . Fear of Current or Ex-Partner:   . Emotionally Abused:   .  Physically Abused:   . Sexually Abused:      No Known Allergies   Outpatient Medications Prior to Visit  Medication Sig Dispense Refill  . blood glucose meter kit and supplies KIT Dispense based on patient and insurance preference. Use up to four times daily as directed. (FOR ICD-9 250.00, 250.01). 1 each 0  . amLODipine (NORVASC) 10 MG tablet Take 1 tablet (10 mg total) by mouth daily. 30 tablet 0  . atorvastatin (LIPITOR) 20 MG tablet Take 1 tablet (20 mg total) by mouth daily at 6 PM. 30 tablet 0  . insulin detemir (LEVEMIR FLEXTOUCH) 100 UNIT/ML FlexPen Inject 25 Units into the skin at bedtime. 15 mL 11  . lipase/protease/amylase (CREON) 12000 units CPEP capsule Take 1 capsule (12,000 Units total) by mouth 3 (three) times daily before meals. 270 capsule 0  . lisinopril (ZESTRIL) 20 MG tablet Take 1 tablet (20 mg total) by mouth daily. 30 tablet 0  . feeding supplement, GLUCERNA SHAKE, (GLUCERNA SHAKE)  LIQD Take 237 mLs by mouth 3 (three) times daily between meals. (Patient not taking: Reported on 4/58/0998)  0  . folic acid (FOLVITE) 1 MG tablet Take 1 tablet (1 mg total) by mouth daily. (Patient not taking: Reported on 10/21/2019) 30 tablet 0   No facility-administered medications prior to visit.      Review of Systems  Constitutional: Positive for activity change, appetite change, fatigue and unexpected weight change.  HENT: Positive for dental problem. Negative for sinus pressure and sinus pain.   Respiratory: Negative.   Cardiovascular: Negative for chest pain, palpitations and leg swelling.  Gastrointestinal: Negative.   Endocrine: Positive for polydipsia and polyuria.  Genitourinary: Positive for enuresis and frequency.  Musculoskeletal: Negative.   Neurological: Positive for light-headedness.  Hematological: Negative.   Psychiatric/Behavioral: Negative.        Objective:   Physical Exam Vitals:   12/10/19 1016  Weight: 137 lb (62.1 kg)  Height: 5' 8" (1.727 m)    Gen: Pleasant, cachectic, in no distress,  normal affect  ENT: No lesions,  mouth clear,  oropharynx clear, no postnasal drip  Neck: No JVD, no TMG, no carotid bruits  Lungs: No use of accessory muscles, no dullness to percussion, clear without rales or rhonchi  Cardiovascular: RRR, heart sounds normal, no murmur or gallops, no peripheral edema  Abdomen: soft and NT, no HSM,  BS normal  Musculoskeletal: No deformities, no cyanosis or clubbing  Neuro: alert, non focal  Skin: Warm, no lesions or rashes  CBG 340  Foot exam shows pulses intact severe dry skin sensation intact fungal involvement of toenails no ulceration or other lesions     Assessment & Plan:  I personally reviewed all images and lab data in the [facility] system as well as any outside material available during this office visit and agree with the  radiology impressions.   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  Plan here will be to change Levemir to 15 units twice daily  We will follow-up CBC and metabolic profile  Severe malnutrition ([place]) We will provide nutritional supplements  Transaminitis Plan to follow-up liver function   Diagnoses and all orders for this visit:  Uncontrolled type 2 diabetes mellitus with hyperglycemia ([place]) -     POCT glucose (manual entry) -  Comprehensive metabolic panel -     CBC with Differential/Platelet  Alcohol-induced chronic pancreatitis (HCC)  Severe malnutrition (HCC)  Transaminitis  Other orders -     amLODipine (NORVASC) 10 MG tablet; Take 1 tablet (10 mg total) by mouth daily. -     atorvastatin (LIPITOR) 20 MG tablet; Take 1 tablet (20 mg total) by mouth daily at 6 PM. -     Discontinue: lipase/protease/amylase (CREON) 12000-38000 units CPEP capsule; Take 1 capsule (12,000 Units total) by mouth 3 (three) times daily before meals. -     lisinopril (ZESTRIL) 20 MG tablet; Take 1 tablet (20 mg total) by mouth daily. -     insulin detemir (LEVEMIR FLEXTOUCH) 100 UNIT/ML FlexPen; Inject 15 Units into the skin 2 (two) times daily. -     Pancrelipase, Lip-Prot-Amyl, (ZENPEP) 20000-63000 units CPEP; Take 1 capsule by mouth 3 (three) times daily with meals.  A tetanus vaccine was given at this visit

## 2019-12-10 NOTE — Patient Instructions (Signed)
Pick up all your new medications from the pharmacy  Begin Zenpep in place of Creon ( when your supply of Creon runs out) one capsule three times a day with meals  Change levemir to 15 units twice a day  A tetanus vaccine was given  No other changes  Return to see [facility] pharmacist in one week  Labs today  See Dr [name] in one month at clinic

## 2019-12-10 NOTE — Progress Notes (Signed)
Here for DM f /u   

## 2019-12-10 NOTE — Progress Notes (Signed)
Patient presents for vaccination against tetanus per orders of Dr. Wright. Consent given. Counseling provided. No contraindications exists. Vaccine administered without incident.   

## 2019-12-10 NOTE — Assessment & Plan Note (Signed)
We will provide nutritional supplements

## 2019-12-10 NOTE — Assessment & Plan Note (Signed)
Alcohol induced pancreatitis  Currently now has pancreatic insufficiency and will continue Zenpep 3 times daily for enzyme replacement

## 2019-12-10 NOTE — Assessment & Plan Note (Signed)
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

## 2019-12-11 LAB — CBC WITH DIFFERENTIAL/PLATELET
Basophils Absolute: 0 10*3/uL (ref 0.0–0.2)
Basos: 1 %
EOS (ABSOLUTE): 0.1 10*3/uL (ref 0.0–0.4)
Eos: 2 %
Hematocrit: 27.5 % — ABNORMAL LOW (ref 37.5–51.0)
Hemoglobin: 8.7 g/dL — ABNORMAL LOW (ref 13.0–17.7)
Immature Grans (Abs): 0 10*3/uL (ref 0.0–0.1)
Immature Granulocytes: 1 %
Lymphocytes Absolute: 1.8 10*3/uL (ref 0.7–3.1)
Lymphs: 22 %
MCH: 29.8 pg (ref 26.6–33.0)
MCHC: 31.6 g/dL (ref 31.5–35.7)
MCV: 94 fL (ref 79–97)
Monocytes Absolute: 0.5 10*3/uL (ref 0.1–0.9)
Monocytes: 6 %
Neutrophils Absolute: 5.7 10*3/uL (ref 1.4–7.0)
Neutrophils: 68 %
Platelets: 381 10*3/uL (ref 150–450)
RBC: 2.92 x10E6/uL — ABNORMAL LOW (ref 4.14–5.80)
RDW: 14.2 % (ref 11.6–15.4)
WBC: 8.2 10*3/uL (ref 3.4–10.8)

## 2019-12-11 LAB — COMPREHENSIVE METABOLIC PANEL
ALT: 22 IU/L (ref 0–44)
AST: 42 IU/L — ABNORMAL HIGH (ref 0–40)
Albumin/Globulin Ratio: 1.2 (ref 1.2–2.2)
Albumin: 2.9 g/dL — ABNORMAL LOW (ref 3.8–4.9)
Alkaline Phosphatase: 232 IU/L — ABNORMAL HIGH (ref 39–117)
BUN/Creatinine Ratio: 14 (ref 9–20)
BUN: 12 mg/dL (ref 6–24)
Bilirubin Total: <0.2 mg/dL (ref 0.0–1.2)
CO2: 19 mmol/L — ABNORMAL LOW (ref 20–29)
Calcium: 8.2 mg/dL — ABNORMAL LOW (ref 8.7–10.2)
Chloride: 106 mmol/L (ref 96–106)
Creatinine, Ser: 0.88 mg/dL (ref 0.76–1.27)
GFR calc Af Amer: 112 mL/min/{1.73_m2} (ref >59)
GFR calc non Af Amer: 97 mL/min/{1.73_m2} (ref >59)
Globulin, Total: 2.5 g/dL (ref 1.5–4.5)
Glucose: 384 mg/dL — ABNORMAL HIGH (ref 65–99)
Potassium: 4.7 mmol/L (ref 3.5–5.2)
Sodium: 138 mmol/L (ref 134–144)
Total Protein: 5.4 g/dL — ABNORMAL LOW (ref 6.0–8.5)

## 2019-12-18 ENCOUNTER — Ambulatory Visit: Payer: Self-pay | Attending: Internal Medicine

## 2019-12-18 DIAGNOSIS — Z23 Encounter for immunization: Secondary | ICD-10-CM

## 2019-12-18 NOTE — Progress Notes (Signed)
   Covid-19 Vaccination Clinic  Name:  Kassem Kibbe    MRN: [id number] DOB: 1966-06-03  12/18/2019  Mr. Sheaffer was observed post Covid-19 immunization for 15 minutes without incident. He was provided with Vaccine Information Sheet and instruction to access the V-Safe system.   Mr. Kille was instructed to call 911 with any severe reactions post vaccine: Marland Kitchen Difficulty breathing  . Swelling of face and throat  . A fast heartbeat  . A bad rash all over body  . Dizziness and weakness   Immunizations Administered    Name Date Dose VIS Date Route   Moderna COVID-19 Vaccine 12/18/2019 10:27 AM 0.5 mL 07/2019 Intramuscular   Manufacturer: Moderna   Lot: [id number]   NDC: 75170-017-49

## 2019-12-28 ENCOUNTER — Inpatient Hospital Stay (HOSPITAL_COMMUNITY): Payer: Self-pay

## 2019-12-28 ENCOUNTER — Encounter (HOSPITAL_COMMUNITY): Payer: Self-pay | Admitting: Emergency Medicine

## 2019-12-28 ENCOUNTER — Emergency Department (HOSPITAL_COMMUNITY): Payer: Self-pay

## 2019-12-28 ENCOUNTER — Inpatient Hospital Stay (HOSPITAL_COMMUNITY)
Admission: EM | Admit: 2019-12-28 | Discharge: 2020-01-06 | DRG: 286 | Disposition: A | Discharge status: Home / Self Care | Payer: Self-pay | Attending: Internal Medicine | Admitting: Internal Medicine

## 2019-12-28 ENCOUNTER — Other Ambulatory Visit: Payer: Self-pay

## 2019-12-28 DIAGNOSIS — J189 Pneumonia, unspecified organism: Secondary | ICD-10-CM

## 2019-12-28 DIAGNOSIS — J9601 Acute respiratory failure with hypoxia: Secondary | ICD-10-CM

## 2019-12-28 DIAGNOSIS — Z794 Long term (current) use of insulin: Secondary | ICD-10-CM

## 2019-12-28 DIAGNOSIS — D649 Anemia, unspecified: Secondary | ICD-10-CM | POA: Diagnosis present

## 2019-12-28 DIAGNOSIS — J449 Chronic obstructive pulmonary disease, unspecified: Secondary | ICD-10-CM | POA: Diagnosis present

## 2019-12-28 DIAGNOSIS — Z720 Tobacco use: Secondary | ICD-10-CM

## 2019-12-28 DIAGNOSIS — Z79899 Other long term (current) drug therapy: Secondary | ICD-10-CM

## 2019-12-28 DIAGNOSIS — K860 Alcohol-induced chronic pancreatitis: Secondary | ICD-10-CM | POA: Diagnosis present

## 2019-12-28 DIAGNOSIS — I10 Essential (primary) hypertension: Secondary | ICD-10-CM

## 2019-12-28 DIAGNOSIS — Z681 Body mass index (BMI) 19 or less, adult: Secondary | ICD-10-CM

## 2019-12-28 DIAGNOSIS — E785 Hyperlipidemia, unspecified: Secondary | ICD-10-CM | POA: Diagnosis present

## 2019-12-28 DIAGNOSIS — F17210 Nicotine dependence, cigarettes, uncomplicated: Secondary | ICD-10-CM | POA: Diagnosis present

## 2019-12-28 DIAGNOSIS — E1110 Type 2 diabetes mellitus with ketoacidosis without coma: Secondary | ICD-10-CM | POA: Diagnosis present

## 2019-12-28 DIAGNOSIS — R739 Hyperglycemia, unspecified: Secondary | ICD-10-CM

## 2019-12-28 DIAGNOSIS — Z9889 Other specified postprocedural states: Secondary | ICD-10-CM

## 2019-12-28 DIAGNOSIS — I5043 Acute on chronic combined systolic (congestive) and diastolic (congestive) heart failure: Secondary | ICD-10-CM | POA: Diagnosis present

## 2019-12-28 DIAGNOSIS — I5042 Chronic combined systolic (congestive) and diastolic (congestive) heart failure: Secondary | ICD-10-CM

## 2019-12-28 DIAGNOSIS — Z590 Homelessness: Secondary | ICD-10-CM

## 2019-12-28 DIAGNOSIS — I428 Other cardiomyopathies: Secondary | ICD-10-CM | POA: Diagnosis present

## 2019-12-28 DIAGNOSIS — I252 Old myocardial infarction: Secondary | ICD-10-CM

## 2019-12-28 DIAGNOSIS — Z20822 Contact with and (suspected) exposure to covid-19: Secondary | ICD-10-CM | POA: Diagnosis present

## 2019-12-28 DIAGNOSIS — N179 Acute kidney failure, unspecified: Secondary | ICD-10-CM | POA: Diagnosis present

## 2019-12-28 DIAGNOSIS — E11649 Type 2 diabetes mellitus with hypoglycemia without coma: Secondary | ICD-10-CM | POA: Diagnosis not present

## 2019-12-28 DIAGNOSIS — R9439 Abnormal result of other cardiovascular function study: Secondary | ICD-10-CM

## 2019-12-28 DIAGNOSIS — Z833 Family history of diabetes mellitus: Secondary | ICD-10-CM

## 2019-12-28 DIAGNOSIS — N3289 Other specified disorders of bladder: Secondary | ICD-10-CM | POA: Diagnosis present

## 2019-12-28 DIAGNOSIS — Z0184 Encounter for antibody response examination: Secondary | ICD-10-CM

## 2019-12-28 DIAGNOSIS — R7989 Other specified abnormal findings of blood chemistry: Secondary | ICD-10-CM

## 2019-12-28 DIAGNOSIS — F1010 Alcohol abuse, uncomplicated: Secondary | ICD-10-CM | POA: Diagnosis present

## 2019-12-28 DIAGNOSIS — I2510 Atherosclerotic heart disease of native coronary artery without angina pectoris: Secondary | ICD-10-CM | POA: Diagnosis present

## 2019-12-28 DIAGNOSIS — E43 Unspecified severe protein-calorie malnutrition: Secondary | ICD-10-CM | POA: Diagnosis present

## 2019-12-28 DIAGNOSIS — I110 Hypertensive heart disease with heart failure: Principal | ICD-10-CM | POA: Diagnosis present

## 2019-12-28 LAB — BASIC METABOLIC PANEL
Anion gap: 10 (ref 5–15)
Anion gap: 16 — ABNORMAL HIGH (ref 5–15)
Anion gap: 13 (ref 5–15)
Anion gap: 10 (ref 5–15)
BUN: 22 mg/dL — ABNORMAL HIGH (ref 6–20)
BUN: 28 mg/dL — ABNORMAL HIGH (ref 6–20)
BUN: 28 mg/dL — ABNORMAL HIGH (ref 6–20)
BUN: 29 mg/dL — ABNORMAL HIGH (ref 6–20)
CO2: 18 mmol/L — ABNORMAL LOW (ref 22–32)
CO2: 13 mmol/L — ABNORMAL LOW (ref 22–32)
CO2: 15 mmol/L — ABNORMAL LOW (ref 22–32)
CO2: 17 mmol/L — ABNORMAL LOW (ref 22–32)
Calcium: 7.7 mg/dL — ABNORMAL LOW (ref 8.9–10.3)
Calcium: 8 mg/dL — ABNORMAL LOW (ref 8.9–10.3)
Calcium: 7.6 mg/dL — ABNORMAL LOW (ref 8.9–10.3)
Calcium: 7.6 mg/dL — ABNORMAL LOW (ref 8.9–10.3)
Chloride: 107 mmol/L (ref 98–111)
Chloride: 105 mmol/L (ref 98–111)
Chloride: 109 mmol/L (ref 98–111)
Chloride: 113 mmol/L — ABNORMAL HIGH (ref 98–111)
Creatinine, Ser: 1.25 mg/dL — ABNORMAL HIGH (ref 0.61–1.24)
Creatinine, Ser: 1.39 mg/dL — ABNORMAL HIGH (ref 0.61–1.24)
Creatinine, Ser: 1.29 mg/dL — ABNORMAL HIGH (ref 0.61–1.24)
Creatinine, Ser: 1.22 mg/dL (ref 0.61–1.24)
GFR calc Af Amer: >60 mL/min (ref >60)
GFR calc Af Amer: >60 mL/min (ref >60)
GFR calc Af Amer: >60 mL/min (ref >60)
GFR calc Af Amer: >60 mL/min (ref >60)
GFR calc non Af Amer: >60 mL/min (ref >60)
GFR calc non Af Amer: 57 mL/min — ABNORMAL LOW (ref >60)
GFR calc non Af Amer: >60 mL/min (ref >60)
GFR calc non Af Amer: >60 mL/min (ref >60)
Glucose, Bld: 564 mg/dL (ref 70–99)
Glucose, Bld: 607 mg/dL (ref 70–99)
Glucose, Bld: 520 mg/dL (ref 70–99)
Glucose, Bld: 205 mg/dL — ABNORMAL HIGH (ref 70–99)
Potassium: 4.2 mmol/L (ref 3.5–5.1)
Potassium: 4.8 mmol/L (ref 3.5–5.1)
Potassium: 3.9 mmol/L (ref 3.5–5.1)
Potassium: 4.1 mmol/L (ref 3.5–5.1)
Sodium: 135 mmol/L (ref 135–145)
Sodium: 134 mmol/L — ABNORMAL LOW (ref 135–145)
Sodium: 137 mmol/L (ref 135–145)
Sodium: 140 mmol/L (ref 135–145)

## 2019-12-28 LAB — CBC WITH DIFFERENTIAL/PLATELET
Abs Immature Granulocytes: 0.05 10*3/uL (ref 0.00–0.07)
Basophils Absolute: 0 10*3/uL (ref 0.0–0.1)
Basophils Relative: 0 %
Eosinophils Absolute: 0 10*3/uL (ref 0.0–0.5)
Eosinophils Relative: 0 %
HCT: 28.4 % — ABNORMAL LOW (ref 39.0–52.0)
Hemoglobin: 8.5 g/dL — ABNORMAL LOW (ref 13.0–17.0)
Immature Granulocytes: 1 %
Lymphocytes Relative: 10 %
Lymphs Abs: 1 10*3/uL (ref 0.7–4.0)
MCH: 28.8 pg (ref 26.0–34.0)
MCHC: 29.9 g/dL — ABNORMAL LOW (ref 30.0–36.0)
MCV: 96.3 fL (ref 80.0–100.0)
Monocytes Absolute: 0.2 10*3/uL (ref 0.1–1.0)
Monocytes Relative: 2 %
Neutro Abs: 8.6 10*3/uL — ABNORMAL HIGH (ref 1.7–7.7)
Neutrophils Relative %: 87 %
Platelets: 311 10*3/uL (ref 150–400)
RBC: 2.95 MIL/uL — ABNORMAL LOW (ref 4.22–5.81)
RDW: 14.1 % (ref 11.5–15.5)
WBC: 9.9 10*3/uL (ref 4.0–10.5)
nRBC: 0 % (ref 0.0–0.2)

## 2019-12-28 LAB — POCT I-STAT 7, (LYTES, BLD GAS, ICA,H+H)
Acid-base deficit: 10 mmol/L — ABNORMAL HIGH (ref 0.0–2.0)
Acid-base deficit: 11 mmol/L — ABNORMAL HIGH (ref 0.0–2.0)
Bicarbonate: 16.7 mmol/L — ABNORMAL LOW (ref 20.0–28.0)
Bicarbonate: 15 mmol/L — ABNORMAL LOW (ref 20.0–28.0)
Calcium, Ion: 1.19 mmol/L (ref 1.15–1.40)
Calcium, Ion: 1.18 mmol/L (ref 1.15–1.40)
HCT: 28 % — ABNORMAL LOW (ref 39.0–52.0)
HCT: 23 % — ABNORMAL LOW (ref 39.0–52.0)
Hemoglobin: 9.5 g/dL — ABNORMAL LOW (ref 13.0–17.0)
Hemoglobin: 7.8 g/dL — ABNORMAL LOW (ref 13.0–17.0)
O2 Saturation: 98 %
O2 Saturation: 98 %
Patient temperature: 98.2
Patient temperature: 98.6
Potassium: 3.9 mmol/L (ref 3.5–5.1)
Potassium: 4.2 mmol/L (ref 3.5–5.1)
Sodium: 136 mmol/L (ref 135–145)
Sodium: 135 mmol/L (ref 135–145)
TCO2: 18 mmol/L — ABNORMAL LOW (ref 22–32)
TCO2: 16 mmol/L — ABNORMAL LOW (ref 22–32)
pCO2 arterial: 38.5 mmHg (ref 32.0–48.0)
pCO2 arterial: 33.8 mmHg (ref 32.0–48.0)
pH, Arterial: 7.244 — ABNORMAL LOW (ref 7.350–7.450)
pH, Arterial: 7.256 — ABNORMAL LOW (ref 7.350–7.450)
pO2, Arterial: 117 mmHg — ABNORMAL HIGH (ref 83.0–108.0)
pO2, Arterial: 120 mmHg — ABNORMAL HIGH (ref 83.0–108.0)

## 2019-12-28 LAB — URINALYSIS, ROUTINE W REFLEX MICROSCOPIC
Bilirubin Urine: NEGATIVE
Glucose, UA: >=500 mg/dL — AB
Ketones, ur: 5 mg/dL — AB
Nitrite: POSITIVE — AB
Protein, ur: >=300 mg/dL — AB
Specific Gravity, Urine: 1.021 (ref 1.005–1.030)
WBC, UA: >50 WBC/hpf — ABNORMAL HIGH (ref 0–5)
pH: 5 (ref 5.0–8.0)

## 2019-12-28 LAB — TRIGLYCERIDES: Triglycerides: 126 mg/dL (ref <150)

## 2019-12-28 LAB — GLUCOSE, CAPILLARY
Glucose-Capillary: 100 mg/dL — ABNORMAL HIGH (ref 70–99)
Glucose-Capillary: 148 mg/dL — ABNORMAL HIGH (ref 70–99)
Glucose-Capillary: 164 mg/dL — ABNORMAL HIGH (ref 70–99)
Glucose-Capillary: 196 mg/dL — ABNORMAL HIGH (ref 70–99)
Glucose-Capillary: 275 mg/dL — ABNORMAL HIGH (ref 70–99)
Glucose-Capillary: 294 mg/dL — ABNORMAL HIGH (ref 70–99)
Glucose-Capillary: 402 mg/dL — ABNORMAL HIGH (ref 70–99)
Glucose-Capillary: 422 mg/dL — ABNORMAL HIGH (ref 70–99)
Glucose-Capillary: 437 mg/dL — ABNORMAL HIGH (ref 70–99)
Glucose-Capillary: 474 mg/dL — ABNORMAL HIGH (ref 70–99)
Glucose-Capillary: 501 mg/dL (ref 70–99)
Glucose-Capillary: 97 mg/dL (ref 70–99)

## 2019-12-28 LAB — CBG MONITORING, ED
Glucose-Capillary: 490 mg/dL — ABNORMAL HIGH (ref 70–99)
Glucose-Capillary: 543 mg/dL (ref 70–99)
Glucose-Capillary: 553 mg/dL (ref 70–99)

## 2019-12-28 LAB — TROPONIN I (HIGH SENSITIVITY)
Troponin I (High Sensitivity): 54 ng/L — ABNORMAL HIGH (ref <18)
Troponin I (High Sensitivity): 64 ng/L — ABNORMAL HIGH (ref <18)

## 2019-12-28 LAB — COMPREHENSIVE METABOLIC PANEL
ALT: 24 U/L (ref 0–44)
AST: 35 U/L (ref 15–41)
Albumin: 2.3 g/dL — ABNORMAL LOW (ref 3.5–5.0)
Alkaline Phosphatase: 221 U/L — ABNORMAL HIGH (ref 38–126)
Anion gap: 12 (ref 5–15)
BUN: 20 mg/dL (ref 6–20)
CO2: 17 mmol/L — ABNORMAL LOW (ref 22–32)
Calcium: 8 mg/dL — ABNORMAL LOW (ref 8.9–10.3)
Chloride: 105 mmol/L (ref 98–111)
Creatinine, Ser: 1.15 mg/dL (ref 0.61–1.24)
GFR calc Af Amer: >60 mL/min (ref >60)
GFR calc non Af Amer: >60 mL/min (ref >60)
Glucose, Bld: 526 mg/dL (ref 70–99)
Potassium: 4.2 mmol/L (ref 3.5–5.1)
Sodium: 134 mmol/L — ABNORMAL LOW (ref 135–145)
Total Bilirubin: 1.4 mg/dL — ABNORMAL HIGH (ref 0.3–1.2)
Total Protein: 6.4 g/dL — ABNORMAL LOW (ref 6.5–8.1)

## 2019-12-28 LAB — BRAIN NATRIURETIC PEPTIDE: B Natriuretic Peptide: 544.4 pg/mL — ABNORMAL HIGH (ref 0.0–100.0)

## 2019-12-28 LAB — MAGNESIUM: Magnesium: 1.9 mg/dL (ref 1.7–2.4)

## 2019-12-28 LAB — FIBRINOGEN: Fibrinogen: >800 mg/dL — ABNORMAL HIGH (ref 210–475)

## 2019-12-28 LAB — LIPASE, BLOOD: Lipase: 14 U/L (ref 11–51)

## 2019-12-28 LAB — OSMOLALITY: Osmolality: 314 mOsm/kg — ABNORMAL HIGH (ref 275–295)

## 2019-12-28 LAB — SARS CORONAVIRUS 2 BY RT PCR (HOSPITAL ORDER, PERFORMED IN ~~LOC~~ HOSPITAL LAB): SARS Coronavirus 2: NEGATIVE

## 2019-12-28 LAB — LACTIC ACID, PLASMA
Lactic Acid, Venous: 2.3 mmol/L (ref 0.5–1.9)
Lactic Acid, Venous: 2.8 mmol/L (ref 0.5–1.9)
Lactic Acid, Venous: 2.6 mmol/L (ref 0.5–1.9)

## 2019-12-28 LAB — BETA-HYDROXYBUTYRIC ACID: Beta-Hydroxybutyric Acid: 0.19 mmol/L (ref 0.05–0.27)

## 2019-12-28 LAB — FERRITIN: Ferritin: 252 ng/mL (ref 24–336)

## 2019-12-28 LAB — ETHANOL: Alcohol, Ethyl (B): <10 mg/dL (ref <10)

## 2019-12-28 LAB — SARS CORONAVIRUS 2 (TAT 6-24 HRS): SARS Coronavirus 2: NEGATIVE

## 2019-12-28 LAB — LACTATE DEHYDROGENASE: LDH: 292 U/L — ABNORMAL HIGH (ref 98–192)

## 2019-12-28 LAB — D-DIMER, QUANTITATIVE: D-Dimer, Quant: 3.06 ug/mL-FEU — ABNORMAL HIGH (ref 0.00–0.50)

## 2019-12-28 LAB — AMMONIA: Ammonia: 25 umol/L (ref 9–35)

## 2019-12-28 LAB — ECHOCARDIOGRAM LIMITED: Weight: 2186.96 oz

## 2019-12-28 LAB — PROCALCITONIN: Procalcitonin: 0.12 ng/mL

## 2019-12-28 LAB — C-REACTIVE PROTEIN: CRP: 6.6 mg/dL — ABNORMAL HIGH (ref <1.0)

## 2019-12-28 LAB — MRSA PCR SCREENING: MRSA by PCR: NEGATIVE

## 2019-12-28 LAB — PHOSPHORUS: Phosphorus: 5.9 mg/dL — ABNORMAL HIGH (ref 2.5–4.6)

## 2019-12-28 MED ORDER — SODIUM CHLORIDE 0.9 % IV SOLN
2.0000 g | INTRAVENOUS | Status: AC
  Administered 2019-12-28: 2 g via INTRAVENOUS
  Filled 2019-12-28: qty 2

## 2019-12-28 MED ORDER — FENTANYL 2500MCG IN NS 250ML (10MCG/ML) PREMIX INFUSION
0.0000 ug/h | INTRAVENOUS | Status: DC
  Administered 2019-12-28: 25 ug/h via INTRAVENOUS
  Filled 2019-12-28 (×3): qty 250

## 2019-12-28 MED ORDER — ALBUTEROL SULFATE (2.5 MG/3ML) 0.083% IN NEBU
INHALATION_SOLUTION | RESPIRATORY_TRACT | Status: AC
  Administered 2019-12-28: 5 mg via RESPIRATORY_TRACT
  Filled 2019-12-28: qty 6

## 2019-12-28 MED ORDER — SODIUM CHLORIDE 0.9 % IV BOLUS
1000.0000 mL | Freq: Once | INTRAVENOUS | Status: AC
  Administered 2019-12-28: 1000 mL via INTRAVENOUS

## 2019-12-28 MED ORDER — PROPOFOL 1000 MG/100ML IV EMUL
INTRAVENOUS | Status: AC
  Filled 2019-12-28: qty 100

## 2019-12-28 MED ORDER — ONDANSETRON HCL 4 MG/2ML IJ SOLN: 4.0000 mg | Freq: Four times a day (QID) | INTRAMUSCULAR | Status: DC | PRN

## 2019-12-28 MED ORDER — SODIUM CHLORIDE 0.9 % IV SOLN: INTRAVENOUS | Status: DC

## 2019-12-28 MED ORDER — SODIUM CHLORIDE 0.9 % IV SOLN
INTRAVENOUS | Status: DC | PRN
  Administered 2019-12-28 – 2019-12-31 (×2): 250 mL via INTRAVENOUS

## 2019-12-28 MED ORDER — HEPARIN SODIUM (PORCINE) 5000 UNIT/ML IJ SOLN: 5000.0000 [IU] | Freq: Three times a day (TID) | INTRAMUSCULAR | Status: DC

## 2019-12-28 MED ORDER — ROCURONIUM BROMIDE 50 MG/5ML IV SOLN
INTRAVENOUS | Status: AC | PRN
  Administered 2019-12-28: 100 mg via INTRAVENOUS

## 2019-12-28 MED ORDER — PROPOFOL 1000 MG/100ML IV EMUL
5.0000 ug/kg/min | INTRAVENOUS | Status: DC
  Administered 2019-12-28: 40 ug/kg/min via INTRAVENOUS
  Administered 2019-12-29: 35 ug/kg/min via INTRAVENOUS
  Administered 2019-12-29: 20 ug/kg/min via INTRAVENOUS
  Administered 2019-12-29: 35 ug/kg/min via INTRAVENOUS
  Administered 2019-12-30: 30 ug/kg/min via INTRAVENOUS
  Filled 2019-12-28 (×7): qty 100

## 2019-12-28 MED ORDER — INSULIN REGULAR(HUMAN) IN NACL 100-0.9 UT/100ML-% IV SOLN
INTRAVENOUS | Status: DC
  Administered 2019-12-28: 10.5 [IU]/h via INTRAVENOUS
  Administered 2019-12-28: 13 [IU]/h via INTRAVENOUS
  Filled 2019-12-28 (×2): qty 100

## 2019-12-28 MED ORDER — ALBUTEROL SULFATE (2.5 MG/3ML) 0.083% IN NEBU: 5.0000 mg | INHALATION_SOLUTION | Freq: Once | RESPIRATORY_TRACT | Status: AC

## 2019-12-28 MED ORDER — ORAL CARE MOUTH RINSE
15.0000 mL | OROMUCOSAL | Status: DC
  Administered 2019-12-28 – 2019-12-31 (×25): 15 mL via OROMUCOSAL

## 2019-12-28 MED ORDER — PROPOFOL 1000 MG/100ML IV EMUL
INTRAVENOUS | Status: AC
  Administered 2019-12-28: 20 ug/kg/min via INTRAVENOUS
  Filled 2019-12-28: qty 100

## 2019-12-28 MED ORDER — SODIUM CHLORIDE 0.9 % IV SOLN
1.0000 g | Freq: Two times a day (BID) | INTRAVENOUS | Status: DC
  Filled 2019-12-28 (×2): qty 1

## 2019-12-28 MED ORDER — DEXTROSE-NACL 5-0.45 % IV SOLN: INTRAVENOUS | Status: DC

## 2019-12-28 MED ORDER — SODIUM CHLORIDE 0.9 % IV SOLN
1.0000 g | Freq: Three times a day (TID) | INTRAVENOUS | Status: DC
  Administered 2019-12-29: 1 g via INTRAVENOUS
  Filled 2019-12-28 (×3): qty 1

## 2019-12-28 MED ORDER — CHLORHEXIDINE GLUCONATE CLOTH 2 % EX PADS
6.0000 | MEDICATED_PAD | Freq: Every day | CUTANEOUS | Status: DC
  Administered 2019-12-29 – 2020-01-05 (×4): 6 via TOPICAL

## 2019-12-28 MED ORDER — DOCUSATE SODIUM 100 MG PO CAPS: 100.0000 mg | ORAL_CAPSULE | Freq: Two times a day (BID) | ORAL | Status: DC | PRN

## 2019-12-28 MED ORDER — ACETAMINOPHEN 325 MG PO TABS: 650.0000 mg | ORAL_TABLET | ORAL | Status: DC | PRN

## 2019-12-28 MED ORDER — ENOXAPARIN SODIUM 40 MG/0.4ML ~~LOC~~ SOLN
40.0000 mg | SUBCUTANEOUS | Status: DC
  Administered 2019-12-28 – 2020-01-02 (×5): 40 mg via SUBCUTANEOUS
  Filled 2019-12-28 (×6): qty 0.4

## 2019-12-28 MED ORDER — CHLORHEXIDINE GLUCONATE 0.12% ORAL RINSE (MEDLINE KIT)
15.0000 mL | Freq: Two times a day (BID) | OROMUCOSAL | Status: DC
  Administered 2019-12-28 – 2020-01-06 (×13): 15 mL via OROMUCOSAL

## 2019-12-28 MED ORDER — POLYETHYLENE GLYCOL 3350 17 G PO PACK: 17.0000 g | PACK | Freq: Every day | ORAL | Status: DC | PRN

## 2019-12-28 MED ORDER — IOHEXOL 350 MG/ML SOLN
100.0000 mL | Freq: Once | INTRAVENOUS | Status: AC | PRN
  Administered 2019-12-28: 100 mL via INTRAVENOUS

## 2019-12-28 MED ORDER — DEXTROSE 50 % IV SOLN
0.0000 mL | INTRAVENOUS | Status: DC | PRN
  Administered 2019-12-30 (×2): 50 mL via INTRAVENOUS
  Administered 2020-01-05: 25 mL via INTRAVENOUS
  Filled 2019-12-28 (×4): qty 50

## 2019-12-28 MED ORDER — PANTOPRAZOLE SODIUM 40 MG IV SOLR
40.0000 mg | Freq: Every day | INTRAVENOUS | Status: DC
  Administered 2019-12-28 – 2020-01-03 (×7): 40 mg via INTRAVENOUS
  Filled 2019-12-28 (×7): qty 40

## 2019-12-28 MED ORDER — ETOMIDATE 2 MG/ML IV SOLN
INTRAVENOUS | Status: AC | PRN
  Administered 2019-12-28: 20 mg via INTRAVENOUS

## 2019-12-28 NOTE — H&P (Addendum)
NAME:  Rodney Barber, MRN:  [id number], DOB:  1966/04/29, LOS: 0 ADMISSION DATE:  12/28/2019, CONSULTATION DATE:  12/28/19 REFERRING MD: Randal Buba - EM  , CHIEF COMPLAINT:  SOB, hypoxia   Brief History   54 yo M presenting with 5d hx SOB, progressive. S/p 1 dose COVID-19 vaccine, has not had second. CXR concerning, with bilat opacities. ETT in ED. CTA chest pending, COVID pending   History of present illness   54 yo M PMH alcoholic pancreatitis, htn, DM2 poorly controlled, homelessness, presents to [facility] 5/22 with CC SOB, cough. Symptoms began several days ago, and are progressive in nature. Pt has received 1 dose of covid vaccine, approx 2 weeks ago, but has not yet received 2nd dose. Pt denies HA neck pain chest pain, n/v/d/abd pain. Endorses tobacco use. Denies known sick contacts.   In ED, pt hypoxic, SpO2 70s on RA. Initially, placed on NRB with SpO2 improvement to 90%. Progressive resp distress and hypoxia  however and decision made to emergently intubate. CXR with bilateral opacities. COVID is pending.   Presenting labs significant for  Na 134, K 4.2, CO2 17 Gu 526 Alk phos 221 AST 35 ALT 24 Ammonia 25 TBili 1.4 BN 544, LDH 292, hsTrop 54 CRP 6.6 ddimer 3.06 fibrinogen >800 WBC 9.9 LA 2.3 PCT 0.12  CT A chest pending  Past Medical History  Alcoholism Alcoholic pancreatitis DM, poorly controlled HTN  Severe protein calorie malnutrition Transaminitis   Significant Hospital Events   5/23 presents to ED with cough SOB hypoxia, intubated   Consults:    Procedures:  5/23 ETT>>   Significant Diagnostic Tests:  5/23 CXR> bilateral multifocal opacities. Possible small bilateral pleural effusions  5/23 CTA chest>> 5/23 CT a/p >>   Micro Data:  5/23 SARS Cov2 PCR>>  5/23 tracheal aspirate >> 5/23 RVP >>   Antimicrobials:  5/23 cefepime >> empirically for PNA   Interim history/subjective:  Intubated in ED, going for CTA chest   Objective   Blood pressure 136/75,  pulse 97, temperature 98.2 F (36.8 C), temperature source Rectal, resp. rate 20, weight 62 kg, SpO2 100 %.    Vent Mode: PRVC FiO2 (%):  [100 %] 100 % Set Rate:  [20 bmp] 20 bmp Vt Set:  [550 mL] 550 mL PEEP:  [5 cmH20] 5 cmH20 Plateau Pressure:  [22 cmH20] 22 cmH20  No intake or output data in the 24 hours ending 12/28/19 0731 Filed Weights   12/28/19 0500  Weight: 62 kg    Examination: General: Chronically and critically ill appearing middle aged M, appears older than stated age. Intubated, sedated.  HENT: NCAT temporal muscle wasting. Trachea midline. ETT secure. Anicteric sclera Lungs: Diffuse bilateral rhonchi. Symmetrical chest expansion, no accessory muscle use on MV.  Cardiovascular: RRR s1s2 no rgm. Cap refill < 3 sec  Abdomen: soft flat ndnt. + bowel sounds  Extremities: No obvious joint deformity. BLE non-pitting edema. No cyanosis or clubbing  Neuro: Sedated, awakens to stimulation, PERRL.  GU: Scrotal edema  Resolved Hospital Problem list     Assessment & Plan:   Acute hypoxemic resp failure requiring mechanical ventilation -elevated ddmimer, going to CTA chest -CXR with bilat opacities -PCT is 0.12 and no WBC, which is somewhat assuring against bacterial PNA.  -COVID PCR is pending, ddimer and other acute phase reactants are elevated -BNP elevated P -maintain airborne/contact for now.  -Follow up COVID-19 PCR. PCR is negative engage ID for recs-- may be candidate for continued precautions -follow up  CTA chest, may need hep gtt -RVP, tracheal aspirate -empiric cefepime, check MRSA swab but with data less suggestive for bacterial PNA will not empirically start vanc at this time  -ECHO    Chronic acoholic pancreatitis  P -trend electrolytes  -cont home pancreatic enzymes  NAGMA with lactic acidosis  -likely partially in setting of chronic pancreatitis  P -IVF   DM2, poorfly controlled with acute hyperglycemia  -insulin gtt initiated in ED -home reg  is levemir 15u BID P -IVF  -transition off insulin gtt when able  -trend BMP, mag, phos   Normocytic anemia  P -Trend CBC, goal hgb > 7  Bladder distention P -foley   Best practice:  Diet: En per RDN  Pain/Anxiety/Delirium protocol (if indicated): Propofol, fentanyl VAP protocol (if indicated): Yes  DVT prophylaxis: lovenox  GI prophylaxis: protonix  Glucose control: SSI  Mobility: BR Code Status: Default is Full at present consistent with most recent admit code status (unclear if this was Full by default or if conversation was had)--  notably patient is presently intubated,  however I see recent documentation that patient expressed DNR/DNI wishes in march and February of 2021 so we will aim to clarify this.   Family Communication: pending. In a prior note, healthcare proxy was noted to be brother, Doral Ventrella.  Disposition: ICU   Labs   CBC: Recent Labs  Lab 12/28/19 0512 12/28/19 0609  WBC 9.9  --   NEUTROABS 8.6*  --   HGB 8.5* 9.5*  HCT 28.4* 28.0*  MCV 96.3  --   PLT 311  --     Basic Metabolic Panel: Recent Labs  Lab 12/28/19 0512 12/28/19 0609  NA 134* 136  K 4.2 3.9  CL 105  --   CO2 17*  --   GLUCOSE 526*  --   BUN 20  --   CREATININE 1.15  --   CALCIUM 8.0*  --    GFR: Estimated Creatinine Clearance: 63.6 mL/min (by C-G formula based on SCr of 1.15 mg/dL). Recent Labs  Lab 12/28/19 0512  PROCALCITON 0.12  WBC 9.9  LATICACIDVEN 2.3*    Liver Function Tests: Recent Labs  Lab 12/28/19 0512  AST 35  ALT 24  ALKPHOS 221*  BILITOT 1.4*  PROT 6.4*  ALBUMIN 2.3*   No results for input(s): LIPASE, AMYLASE in the last 168 hours. Recent Labs  Lab 12/28/19 0512  AMMONIA 25    ABG    Component Value Date/Time   PHART 7.244 (L) 12/28/2019 0609   PCO2ART 38.5 12/28/2019 0609   PO2ART 117 (H) 12/28/2019 0609   HCO3 16.7 (L) 12/28/2019 0609   TCO2 18 (L) 12/28/2019 0609   ACIDBASEDEF 10.0 (H) 12/28/2019 0609   O2SAT 98.0  12/28/2019 0609     Coagulation Profile: No results for input(s): INR, PROTIME in the last 168 hours.  Cardiac Enzymes: No results for input(s): CKTOTAL, CKMB, CKMBINDEX, TROPONINI in the last 168 hours.  HbA1C: HbA1c, POC (controlled diabetic range)  Date/Time Value Ref Range Status  03/15/2018 10:14 AM 14.4 (A) 0.0 - 7.0 % Final   Hgb A1c MFr Bld  Date/Time Value Ref Range Status  11/14/2019 05:09 PM 13.9 (H) 4.8 - 5.6 % Final    Comment:    (NOTE)         Prediabetes: 5.7 - 6.4         Diabetes: >6.4         Glycemic control for adults with diabetes: <7.0  09/10/2019 05:29 AM >18.5 (H) 4.8 - 5.6 % Final    CBG: Recent Labs  Lab 12/28/19 0611  GLUCAP 490*    Review of Systems:   Unable to obtain intubated, sedated  Past Medical History  He,  has a past medical history of Diabetes mellitus, Hypertension, and Pancreatitis.   Surgical History    Past Surgical History:  Procedure Laterality Date  . ERCP  06/27/2011   Procedure: ENDOSCOPIC RETROGRADE CHOLANGIOPANCREATOGRAPHY (ERCP);  Surgeon: Jeryl Columbia, MD;  Location: [facility];  Service: Endoscopy;  Laterality: N/A;  . ERCP W/ PLASTIC STENT PLACEMENT  03/2011  . IR US GUIDE BX ASP/DRAIN  05/16/2019  . TEE WITHOUT CARDIOVERSION N/A 05/20/2019   Procedure: TRANSESOPHAGEAL ECHOCARDIOGRAM (TEE);  Surgeon: [name], MD;  Location: [facility] ENDOSCOPY;  Service: Cardiovascular;  Laterality: N/A;     Social History   reports that he has been smoking cigarettes. He has a 15.00 pack-year smoking history. He has never used smokeless tobacco. He reports previous drug use. Drugs: "Crack" cocaine, Other-see comments, and Cocaine. He reports that he does not drink alcohol.   Family History   His family history includes Diabetes in his brother and mother.   Allergies No Known Allergies   Home Medications  Prior to Admission medications   Medication Sig Start Date End Date Taking? Authorizing Provider  amLODipine  (NORVASC) 10 MG tablet Take 1 tablet (10 mg total) by mouth daily. 12/10/19 01/09/20  Elsie Stain, MD  atorvastatin (LIPITOR) 20 MG tablet Take 1 tablet (20 mg total) by mouth daily at 6 PM. 12/10/19 01/09/20  Elsie Stain, MD  blood glucose meter kit and supplies KIT Dispense based on patient and insurance preference. Use up to four times daily as directed. (FOR ICD-9 250.00, 250.01). 11/15/19   Little Ishikawa, MD  feeding supplement, GLUCERNA SHAKE, (GLUCERNA SHAKE) LIQD Take 237 mLs by mouth 3 (three) times daily between meals. Patient not taking: Reported on 10/21/2019 09/11/19   Mariel Aloe, MD  insulin detemir (LEVEMIR FLEXTOUCH) 100 UNIT/ML FlexPen Inject 15 Units into the skin 2 (two) times daily. 12/10/19   Elsie Stain, MD  lisinopril (ZESTRIL) 20 MG tablet Take 1 tablet (20 mg total) by mouth daily. 12/10/19 01/09/20  Elsie Stain, MD  Pancrelipase, Lip-Prot-Amyl, (ZENPEP) 20000-63000 units CPEP Take 1 capsule by mouth 3 (three) times daily with meals. 12/10/19 01/09/20  Elsie Stain, MD     Critical care time: 60 min     Eliseo Gum MSN, AGACNP-BC [place] [phone] If no answer, [phone] 12/28/2019, 9:24 AM

## 2019-12-28 NOTE — ED Notes (Signed)
Patient transported to CT with this RN and Fayrene Fearing RRT

## 2019-12-28 NOTE — Progress Notes (Signed)
CRITICAL VALUE ALERT  Critical Value:  Glucose 607  Date & Time Notied:  12/28/19 1300  Provider Notified: Dr. Delton Coombes  Orders Received/Actions taken: No new orders given.  Erick Blinks, RN

## 2019-12-28 NOTE — Progress Notes (Signed)
  Echocardiogram 2D Echocardiogram has been performed.  Celene Skeen 12/28/2019, 4:13 PM

## 2019-12-28 NOTE — ED Provider Notes (Signed)
[facility] EMERGENCY DEPARTMENT Provider Note   CSN: [id number] Arrival date & time: 12/28/19  0405     History Chief Complaint  Patient presents with  . Shortness of Breath    Rodney Barber is a 54 y.o. male with a hx of hypertension, alcohol induced pancreatitis, diabetes presents to the Emergency Department complaining of gradual, persistent, progressively worsening cough and shortness of breath several days ago.  Patient reports he has had cough and worsening shortness of breath.  He denies headache, neck pain, chest pain, abdominal pain, nausea, vomiting, diarrhea.  He reports he is a current smoker.  He had his first Covid vaccine 2 weeks ago but has not yet had a second.  Denies any known sick or Covid contacts.  Patient reports chills and subjective fevers.  Level 5 caveat for acuity of condition   The history is provided by the patient and medical records. No language interpreter was used.       Past Medical History:  Diagnosis Date  . Diabetes mellitus   . Hypertension   . Pancreatitis     Patient Active Problem List   Diagnosis Date Noted  . Transaminitis 09/10/2019  . Tobacco use 05/14/2019  . Substance use disorder 02/18/2019  . Uncontrolled type 2 diabetes mellitus ([place]) 03/15/2018  . 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 Severe malnutrition ([place]) 07/11/2013  . Protein-calorie malnutrition, severe ([place]) 07/11/2013    Past Surgical History:  Procedure Laterality Date  . ERCP  06/27/2011   Procedure: ENDOSCOPIC RETROGRADE CHOLANGIOPANCREATOGRAPHY (ERCP);  Surgeon: Jeryl Columbia, MD;  Location: [facility];  Service: Endoscopy;  Laterality: N/A;  . ERCP W/ PLASTIC STENT PLACEMENT  03/2011  . IR US GUIDE BX ASP/DRAIN  05/16/2019  . TEE WITHOUT CARDIOVERSION N/A 05/20/2019   Procedure: TRANSESOPHAGEAL ECHOCARDIOGRAM (TEE);  Surgeon: [name], MD;  Location: [facility] ENDOSCOPY;  Service: Cardiovascular;  Laterality:  N/A;       Family History  Problem Relation Age of Onset  . Diabetes Mother   . Diabetes Brother     Social History   Tobacco Use  . Smoking status: Current Every Day Smoker    Packs/day: 0.50    Years: 30.00    Pack years: 15.00    Types: Cigarettes  . Smokeless tobacco: Never Used  . Tobacco comment: 6 cigarett /day  Substance Use Topics  . Alcohol use: No  . Drug use: Not Currently    Types: "Crack" cocaine, Other-see comments, Cocaine    Comment: last smoked crack 12-16-16    Home Medications Prior to Admission medications   Medication Sig Start Date End Date Taking? Authorizing Provider  amLODipine (NORVASC) 10 MG tablet Take 1 tablet (10 mg total) by mouth daily. 12/10/19 01/09/20  Elsie Stain, MD  atorvastatin (LIPITOR) 20 MG tablet Take 1 tablet (20 mg total) by mouth daily at 6 PM. 12/10/19 01/09/20  Elsie Stain, MD  blood glucose meter kit and supplies KIT Dispense based on patient and insurance preference. Use up to four times daily as directed. (FOR ICD-9 250.00, 250.01). 11/15/19   Little Ishikawa, MD  feeding supplement, GLUCERNA SHAKE, (GLUCERNA SHAKE) LIQD Take 237 mLs by mouth 3 (three) times daily between meals. Patient not taking: Reported on 10/21/2019 09/11/19   Mariel Aloe, MD  insulin detemir (LEVEMIR FLEXTOUCH) 100 UNIT/ML FlexPen Inject 15 Units into the skin 2 (two) times daily. 12/10/19   Elsie Stain, MD  lisinopril (ZESTRIL) 20 MG  tablet Take 1 tablet (20 mg total) by mouth daily. 12/10/19 01/09/20  Elsie Stain, MD  Pancrelipase, Lip-Prot-Amyl, (ZENPEP) 20000-63000 units CPEP Take 1 capsule by mouth 3 (three) times daily with meals. 12/10/19 01/09/20  Elsie Stain, MD    Allergies    Patient has no known allergies.  Review of Systems   Review of Systems  Unable to perform ROS: Acuity of condition  Constitutional: Positive for chills, fatigue and fever.  Respiratory: Positive for chest tightness and shortness of breath.      Physical Exam Updated Vital Signs BP (!) 170/88 (BP Location: Right Arm)   Pulse (!) 110   Resp (!) 32   Wt 62 kg   BMI 20.78 kg/m   Physical Exam Vitals and nursing note reviewed. Exam conducted with a chaperone present.  Constitutional:      General: He is in acute distress.     Appearance: He is ill-appearing and diaphoretic.  HENT:     Head: Normocephalic.  Eyes:     General: No scleral icterus.    Conjunctiva/sclera: Conjunctivae normal.  Cardiovascular:     Rate and Rhythm: Regular rhythm. Tachycardia present.     Pulses: Normal pulses.          Radial pulses are 2+ on the right side and 2+ on the left side.  Pulmonary:     Effort: Tachypnea, accessory muscle usage, respiratory distress and retractions present. No prolonged expiration.     Breath sounds: Decreased air movement present. No stridor. Rhonchi and rales present.     Comments: Equal chest rise. Significant respiratory distress.  Oxygen saturations 72% on 15 L via nonrebreather.  Rhonchi and rales noted on exam. Abdominal:     General: There is no distension.     Palpations: Abdomen is soft.     Tenderness: There is no abdominal tenderness. There is no guarding or rebound.  Genitourinary:    Penis: Circumcised. Swelling present.      Testes: Normal.     Comments: Edema of the penis noted.  Additionally some edema of the bilateral thighs.  No scrotal edema.  No induration to the perineum. Musculoskeletal:     Cervical back: Normal range of motion.     Comments: Moves all extremities equally and without difficulty.  Skin:    General: Skin is warm.     Capillary Refill: Capillary refill takes less than 2 seconds.  Neurological:     Mental Status: He is alert.     GCS: GCS eye subscore is 4. GCS verbal subscore is 5. GCS motor subscore is 6.     Comments: Speech is clear and goal oriented.  Psychiatric:        Mood and Affect: Mood normal.     ED Results / Procedures / Treatments   Labs (all labs  ordered are listed, but only abnormal results are displayed) Labs Reviewed  LACTIC ACID, PLASMA - Abnormal; Notable for the following components:      Result Value   Lactic Acid, Venous 2.3 (*)    All other components within normal limits  CBC WITH DIFFERENTIAL/PLATELET - Abnormal; Notable for the following components:   RBC 2.95 (*)    Hemoglobin 8.5 (*)    HCT 28.4 (*)    MCHC 29.9 (*)    Neutro Abs 8.6 (*)    All other components within normal limits  COMPREHENSIVE METABOLIC PANEL - Abnormal; Notable for the following components:   Sodium 134 (*)  CO2 17 (*)    Glucose, Bld 526 (*)    Calcium 8.0 (*)    Total Protein 6.4 (*)    Albumin 2.3 (*)    Alkaline Phosphatase 221 (*)    Total Bilirubin 1.4 (*)    All other components within normal limits  D-DIMER, QUANTITATIVE (NOT AT [facility]) - Abnormal; Notable for the following components:   D-Dimer, Quant 3.06 (*)    All other components within normal limits  LACTATE DEHYDROGENASE - Abnormal; Notable for the following components:   LDH 292 (*)    All other components within normal limits  FIBRINOGEN - Abnormal; Notable for the following components:   Fibrinogen >800 (*)    All other components within normal limits  C-REACTIVE PROTEIN - Abnormal; Notable for the following components:   CRP 6.6 (*)    All other components within normal limits  BRAIN NATRIURETIC PEPTIDE - Abnormal; Notable for the following components:   B Natriuretic Peptide 544.4 (*)    All other components within normal limits  CBG MONITORING, ED - Abnormal; Notable for the following components:   Glucose-Capillary 490 (*)    All other components within normal limits  POCT I-STAT 7, (LYTES, BLD GAS, ICA,H+H) - Abnormal; Notable for the following components:   pH, Arterial 7.244 (*)    pO2, Arterial 117 (*)    Bicarbonate 16.7 (*)    TCO2 18 (*)    Acid-base deficit 10.0 (*)    HCT 28.0 (*)    Hemoglobin 9.5 (*)    All other components within normal  limits  TROPONIN I (HIGH SENSITIVITY) - Abnormal; Notable for the following components:   Troponin I (High Sensitivity) 54 (*)    All other components within normal limits  SARS CORONAVIRUS 2 (TAT 6-24 HRS)  CULTURE, BLOOD (ROUTINE X 2)  CULTURE, BLOOD (ROUTINE X 2)  SARS CORONAVIRUS 2 BY RT PCR (HOSPITAL ORDER, [place] [facility])  PROCALCITONIN  FERRITIN  TRIGLYCERIDES  ETHANOL  AMMONIA  LACTIC ACID, PLASMA  BLOOD GAS, ARTERIAL  BLOOD GAS, ARTERIAL  TROPONIN I (HIGH SENSITIVITY)    EKG ED ECG REPORT   Date: 12/28/2019  Rate: 103  Rhythm: sinus tachycardia  QRS Axis: normal  Intervals: normal  ST/T Wave abnormalities: early repolarization  Conduction Disutrbances:none  Narrative Interpretation: Early repolarization; anterior infarct, old.  Old EKG Reviewed: changes noted from 11/2019  I have personally reviewed the EKG tracing and agree with the computerized printout as noted.   Radiology DG Chest 1 View  Result Date: 12/28/2019 CLINICAL DATA:  Post intubation EXAM: CHEST  1 VIEW COMPARISON:  12/28/2019 at 0430 hours FINDINGS: Endotracheal tube terminates 3 cm above the carina. Enteric tube terminates in the gastric cardia. Multifocal pneumonia, lingular and lower lobe predominant. Suspected small left pleural effusion. The heart is normal in size.  Thoracic aortic atherosclerosis. IMPRESSION: Endotracheal tube terminates 3 cm above the carina. Enteric tube terminates in the gastric cardia. Multifocal pneumonia, lower lobe predominant. Electronically Signed   By: Julian Hy M.D.   On: 12/28/2019 06:15   DG Chest Port 1 View  Result Date: 12/28/2019 CLINICAL DATA:  Shortness of breath, hypoxia EXAM: PORTABLE CHEST 1 VIEW COMPARISON:  11/14/2019 FINDINGS: Multifocal pneumonia in the lingula, right middle lobe, and bilateral lower lobes. Additional mild patchy opacity in the right upper lobe. Possible small bilateral pleural effusions, although  equivocal. No pneumothorax. Cardiomegaly.  Thoracic aortic atherosclerosis. IMPRESSION: Multifocal pneumonia. Electronically Signed   By: Bertis Ruddy  [place] Pink M.D.   On: 12/28/2019 05:24   DG Abd Portable 1 View  Result Date: 12/28/2019 CLINICAL DATA:  Post OG insertion EXAM: PORTABLE ABDOMEN - 1 VIEW COMPARISON:  CT abdomen/pelvis dated 10/27/2019 FINDINGS: Enteric tube terminates in the proximal gastric body. Nonobstructive bowel gas pattern. Bilateral lower lobe opacities with suspected small left pleural effusion. IMPRESSION: Enteric tube terminates in the proximal gastric body. Electronically Signed   By: Julian Hy M.D.   On: 12/28/2019 06:15    Procedures Procedure Name: Intubation Date/Time: 12/28/2019 5:04 AM Performed by: Abigail Butts, PA-C Pre-anesthesia Checklist: Patient identified, Emergency Drugs available, Suction available, Patient being monitored and Timeout performed Oxygen Delivery Method: Non-rebreather mask Preoxygenation: Pre-oxygenation with 100% oxygen Induction Type: Rapid sequence Ventilation: Mask ventilation without difficulty Laryngoscope Size: Mac and 4 Tube size: 7.5 mm Number of attempts: 1 Airway Equipment and Method: Video-laryngoscopy Placement Confirmation: ETT inserted through vocal cords under direct vision,  CO2 detector and Breath sounds checked- equal and bilateral Secured at: 23 cm Tube secured with: ETT holder Dental Injury: Teeth and Oropharynx as per pre-operative assessment     .Critical Care Performed by: Abigail Butts, PA-C Authorized by: Abigail Butts, PA-C   Critical care provider statement:    Critical care time (minutes):  80   Critical care time was exclusive of:  Separately billable procedures and treating other patients and teaching time   Critical care was necessary to treat or prevent imminent or life-threatening deterioration of the following conditions:  Respiratory failure   Critical care was time  spent personally by me on the following activities:  Discussions with consultants, evaluation of patient's response to treatment, examination of patient, ordering and performing treatments and interventions, ordering and review of laboratory studies, ordering and review of radiographic studies, pulse oximetry, re-evaluation of patient's condition, obtaining history from patient or surrogate and review of old charts   I assumed direction of critical care for this patient from another provider in my specialty: no     (including critical care time)  Medications Ordered in ED Medications  propofol (DIPRIVAN) 1000 MG/100ML infusion (40 mcg/kg/min  62 kg Intravenous Rate/Dose Change 12/28/19 0706)  propofol (DIPRIVAN) 1000 MG/100ML infusion (has no administration in time range)  fentaNYL 2530mg in NS 2588m(1098mml) infusion-PREMIX (25 mcg/hr Intravenous New Bag/Given 12/28/19 0734)  albuterol (PROVENTIL) (2.5 MG/3ML) 0.083% nebulizer solution 5 mg (5 mg Nebulization Given 12/28/19 0430)  etomidate (AMIDATE) injection (20 mg Intravenous Given 12/28/19 0451)  rocuronium (ZEMURON) injection (100 mg Intravenous Given 12/28/19 0451)    ED Course  I have reviewed the triage vital signs and the nursing notes.  Pertinent labs & imaging results that were available during my care of the patient were reviewed by me and considered in my medical decision making (see chart for details).  Clinical Course as of Dec 28 734  Sun Dec 28, 2019  0614 Acidotic  pH, Arterial(!): 7.244 [HM]  0614 Within normal limits  WBC: 9.9 [HM]  0614 Definitely elevated D-dimer.  D-Dimer, Quant(!): 3.06 [HM]  0614 Hyperglycemic  Glucose-Capillary(!): 490 [HM]  0659 Elevated, pt denied CP   Troponin I (High Sensitivity)(!): 54 [HM]  0700 Elevated; no hx of BNP  B Natriuretic Peptide(!): 544.4 [HM]    Clinical Course User Index [HM] Izel Eisenhardt, HanGwenlyn PerkingMDM Rules/Calculators/A&P                       Rodney Barber evaluated in Emergency Department on  12/28/2019 for the symptoms described in the history of present illness. He was evaluated in the context of the global COVID-19 pandemic, which necessitated consideration that the patient might be at risk for infection with the SARS-CoV-2 virus that causes COVID-19. Institutional protocols and algorithms that pertain to the evaluation of patients at risk for COVID-19 are in a state of rapid change based on information released by regulatory bodies including the CDC and federal and state organizations. These policies and algorithms were followed during the patient's care in the ED.   Patient presents to the emergency department with cough and shortness of breath.  Severe respiratory distress with significant hypoxia.  Patient with increased work of breathing.  Supplemental oxygen does not increase oxygen saturations.  Patient is critically ill and requires emergent airway intervention.  Patient intubated.  Chest x-ray with multifocal pneumonia concerning for COVID-19.  I personally evaluated these images.  Labs pending.  Patient will be admitted to PCCM.  6:14 AM  Discussed with Dr. Oletta Darter of PCCM who will admit.  Patient with significantly elevated D-dimer.  Covid test continues to pend.  Suspect Covid however given patient's severe hypoxia, respiratory distress and significantly elevated D-dimer and CT angiogram for pulmonary embolism.  He is hyperglycemic and acidotic.  Slightly elevated troponin however suspect this is demand ischemia.  No baseline for comparison.  The patient was discussed with and seen by Dr. Randal Buba who agrees with the treatment plan.   Final Clinical Impression(s) / ED Diagnoses Final diagnoses:  Acute respiratory failure with hypoxia (HCC)  Hyperglycemia  Elevated brain natriuretic peptide (BNP) level  Elevated d-dimer    Rx / DC Orders ED Discharge Orders    None       Loni Muse Gwenlyn Perking 12/28/19 [place], April, MD 12/28/19 0745

## 2019-12-28 NOTE — ED Notes (Signed)
Pt awake, breathing over vent. Visibly anxious. See MAR for propofol adjustments.

## 2019-12-28 NOTE — ED Notes (Signed)
This nurse notified for critical glucose level value by lab.

## 2019-12-28 NOTE — Progress Notes (Signed)
RT note: RT and RN transported vent patient to CT and back to ED. Vital signs stable through out. 

## 2019-12-28 NOTE — ED Notes (Signed)
This nurse notified about critical lactic acid level by lab.

## 2019-12-28 NOTE — ED Notes (Signed)
Pt intubated with 7.5 ETT 24 ATL with pos  Equal breath sounds and color change on calorimeter. Pt improved O2 to 93%

## 2019-12-28 NOTE — ED Notes (Signed)
Airway cart and RSI kit to bedside.

## 2019-12-28 NOTE — Progress Notes (Signed)
Pharmacy Antibiotic Note  Rodney Barber is a 54 y.o. male admitted on 12/28/2019 with respiratory distress and concern for PNA. Pharmacy has been consulted for Cefepime dosing.  The patient is noted to be in AKI with SCr 1.25 (baseline 0.5-0.6). Will reduce Cefepime accordingly with plans to increase aggressively as SCr begins to trend down.   Plan: - Cefepime 2g IV x 1 followed by 1g IV every 12 hours - Will monitor for improvement in renal function and adjust accordingly - Will continue to follow renal function, culture results, LOT, and antibiotic de-escalation plans    Weight: 62 kg (136 lb 11 oz)  Temp (24hrs), Avg:98.2 F (36.8 C), Min:98.2 F (36.8 C), Max:98.2 F (36.8 C)  Recent Labs  Lab 12/28/19 0512 12/28/19 0650 12/28/19 0736  WBC 9.9  --   --   CREATININE 1.15  --  1.25*  LATICACIDVEN 2.3* 2.8*  --     Estimated Creatinine Clearance: 58.6 mL/min (A) (by C-G formula based on SCr of 1.25 mg/dL (H)).    No Known Allergies  Antimicrobials this admission: Cefepime 5/23 >>  Dose adjustments this admission: n/a  Microbiology results: 5/23 BCx >> 5/23 COVID >> neg 5/23 RCx >> 5/23 MRSA PCR >>  Thank you for allowing pharmacy to be a part of this patient's care.  Georgina Pillion, PharmD, BCPS Clinical Pharmacist Clinical phone for 12/28/2019: [phone] 12/28/2019 9:20 AM   **Pharmacist phone directory can now be found on [email or web address] ([email or web address]).  Listed under [facility].

## 2019-12-28 NOTE — ED Notes (Signed)
CBG 490, RN billy aware

## 2019-12-28 NOTE — Progress Notes (Signed)
CRITICAL VALUE ALERT  Critical Value:  Glucose 520  Date & Time Notied:  12/28/19 1500  Provider Notified: Dr. Delton Coombes  Orders Received/Actions taken: MD aware glucose is high  Anzel Kearse, Noreene Filbert, RN

## 2019-12-28 NOTE — ED Notes (Signed)
20 mg Etomidate and 100 mg rocuronium pushed for intubation.

## 2019-12-28 NOTE — Progress Notes (Signed)
RT note: RT and RN transported vent patient to 2M13. Vital signs stable through out.

## 2019-12-28 NOTE — ED Triage Notes (Signed)
Per EMS, pt from shelter, has been experiencing SOB/coughing for the past day.  Wheezing/Ronci bilateral, was found 76% on RA, placed on 10L non-rebreather, increased to 97%.  Was given 125 Solumedrol by EMS.  Denies any chest pain or other symptoms.  A/O X4 and ambulatory on scene.    150/71 106HR 24RR

## 2019-12-28 NOTE — Progress Notes (Addendum)
ID PROGRESS NOTE   54 yo M PMH alcoholic pancreatitis, htn, DM2 poorly controlled, homelessness, presents to [facility] 5/22 with CC SOB, cough. Symptoms began several days ago, and are progressive in nature. Pt is only partially immune with 1 dose of moderna covid vaccine, approx 2 weeks ago and Denies known sick contacts.   In ED, pt hypoxic, SpO2 70s on RA. Initially, placed on NRB with SpO2 improvement to 90%. Progressive resp distress and hypoxia  however and decision made to emergently intubate. CXR with bilateral opacities. COVID PCR negative x 2.   however some markers for covid disease are also elevated/consistent with COVID  Ferritin 252 ddimer 3.06 ldh 292 procalcitonin 0.12 Fibrinogen > 800 CRP 6.6  Please keep patient on airborne/contact isolation for the time being- as a PUI. We will recheck covid-19 test(cepheid ) tomorrow. If still negative, then can de-escalate isolation to:  Droplet/contact (eyewear-surgical mask-gown glove)  And if any AGP are being done - then need to wear N95 plus eyewear.    Will check the following blood test ( antibody for nucleocapsid which would POSITIVE if you have disease ( would not be positive if immune by vaccination) (test code 164068)and covid Ig M (which should not be positive if you have had vaccine)(164034). I will place those orders as misc test to send to [facility]  Airica Schwartzkopf B. Drue Second MD MPH [facility] [phone]

## 2019-12-28 NOTE — ED Notes (Addendum)
This nurse notified MD of pt desaturating to 80 on 15 lpm NRB

## 2019-12-28 NOTE — ED Notes (Signed)
Pt noted to have bilateral, hard swelling to inner thighs and to penis/scrotum. PA notified of finding.

## 2019-12-28 NOTE — ED Notes (Signed)
Pt in obvious resp distress.

## 2019-12-29 ENCOUNTER — Inpatient Hospital Stay (HOSPITAL_COMMUNITY): Payer: Self-pay

## 2019-12-29 DIAGNOSIS — J9601 Acute respiratory failure with hypoxia: Secondary | ICD-10-CM

## 2019-12-29 LAB — BASIC METABOLIC PANEL
Anion gap: 13 (ref 5–15)
Anion gap: 8 (ref 5–15)
BUN: 29 mg/dL — ABNORMAL HIGH (ref 6–20)
BUN: 30 mg/dL — ABNORMAL HIGH (ref 6–20)
CO2: 16 mmol/L — ABNORMAL LOW (ref 22–32)
CO2: 16 mmol/L — ABNORMAL LOW (ref 22–32)
Calcium: 7.8 mg/dL — ABNORMAL LOW (ref 8.9–10.3)
Calcium: 7.7 mg/dL — ABNORMAL LOW (ref 8.9–10.3)
Chloride: 112 mmol/L — ABNORMAL HIGH (ref 98–111)
Chloride: 114 mmol/L — ABNORMAL HIGH (ref 98–111)
Creatinine, Ser: 1.22 mg/dL (ref 0.61–1.24)
Creatinine, Ser: 1.25 mg/dL — ABNORMAL HIGH (ref 0.61–1.24)
GFR calc Af Amer: >60 mL/min (ref >60)
GFR calc Af Amer: >60 mL/min (ref >60)
GFR calc non Af Amer: >60 mL/min (ref >60)
GFR calc non Af Amer: >60 mL/min (ref >60)
Glucose, Bld: 106 mg/dL — ABNORMAL HIGH (ref 70–99)
Glucose, Bld: 107 mg/dL — ABNORMAL HIGH (ref 70–99)
Potassium: 4.1 mmol/L (ref 3.5–5.1)
Potassium: 4.3 mmol/L (ref 3.5–5.1)
Sodium: 141 mmol/L (ref 135–145)
Sodium: 138 mmol/L (ref 135–145)

## 2019-12-29 LAB — BETA-HYDROXYBUTYRIC ACID: Beta-Hydroxybutyric Acid: 0.11 mmol/L (ref 0.05–0.27)

## 2019-12-29 LAB — PROCALCITONIN: Procalcitonin: 1.14 ng/mL

## 2019-12-29 LAB — CBC
HCT: 20.9 % — ABNORMAL LOW (ref 39.0–52.0)
HCT: 30.9 % — ABNORMAL LOW (ref 39.0–52.0)
Hemoglobin: 6.7 g/dL — CL (ref 13.0–17.0)
Hemoglobin: 9.7 g/dL — ABNORMAL LOW (ref 13.0–17.0)
MCH: 29.5 pg (ref 26.0–34.0)
MCH: 29.5 pg (ref 26.0–34.0)
MCHC: 32.1 g/dL (ref 30.0–36.0)
MCHC: 31.4 g/dL (ref 30.0–36.0)
MCV: 92.1 fL (ref 80.0–100.0)
MCV: 93.9 fL (ref 80.0–100.0)
Platelets: 300 10*3/uL (ref 150–400)
Platelets: 299 10*3/uL (ref 150–400)
RBC: 2.27 MIL/uL — ABNORMAL LOW (ref 4.22–5.81)
RBC: 3.29 MIL/uL — ABNORMAL LOW (ref 4.22–5.81)
RDW: 14.2 % (ref 11.5–15.5)
RDW: 13.9 % (ref 11.5–15.5)
WBC: 11.6 10*3/uL — ABNORMAL HIGH (ref 4.0–10.5)
WBC: 14.4 10*3/uL — ABNORMAL HIGH (ref 4.0–10.5)
nRBC: 0 % (ref 0.0–0.2)
nRBC: 0.2 % (ref 0.0–0.2)

## 2019-12-29 LAB — GLUCOSE, CAPILLARY
Glucose-Capillary: 101 mg/dL — ABNORMAL HIGH (ref 70–99)
Glucose-Capillary: 102 mg/dL — ABNORMAL HIGH (ref 70–99)
Glucose-Capillary: 104 mg/dL — ABNORMAL HIGH (ref 70–99)
Glucose-Capillary: 105 mg/dL — ABNORMAL HIGH (ref 70–99)
Glucose-Capillary: 106 mg/dL — ABNORMAL HIGH (ref 70–99)
Glucose-Capillary: 109 mg/dL — ABNORMAL HIGH (ref 70–99)
Glucose-Capillary: 110 mg/dL — ABNORMAL HIGH (ref 70–99)
Glucose-Capillary: 113 mg/dL — ABNORMAL HIGH (ref 70–99)
Glucose-Capillary: 116 mg/dL — ABNORMAL HIGH (ref 70–99)
Glucose-Capillary: 64 mg/dL — ABNORMAL LOW (ref 70–99)
Glucose-Capillary: 76 mg/dL (ref 70–99)
Glucose-Capillary: 88 mg/dL (ref 70–99)
Glucose-Capillary: 94 mg/dL (ref 70–99)
Glucose-Capillary: 95 mg/dL (ref 70–99)
Glucose-Capillary: 96 mg/dL (ref 70–99)

## 2019-12-29 LAB — MAGNESIUM
Magnesium: 1.7 mg/dL (ref 1.7–2.4)
Magnesium: 1.8 mg/dL (ref 1.7–2.4)

## 2019-12-29 LAB — PHOSPHORUS
Phosphorus: 4.4 mg/dL (ref 2.5–4.6)
Phosphorus: 4.2 mg/dL (ref 2.5–4.6)

## 2019-12-29 LAB — LIPASE, BLOOD: Lipase: 15 U/L (ref 11–51)

## 2019-12-29 LAB — D-DIMER, QUANTITATIVE: D-Dimer, Quant: 1.64 ug/mL-FEU — ABNORMAL HIGH (ref 0.00–0.50)

## 2019-12-29 LAB — MISC LABCORP TEST (SEND OUT)
Labcorp test code: 164034
Labcorp test code: 164068

## 2019-12-29 LAB — LACTATE DEHYDROGENASE: LDH: 176 U/L (ref 98–192)

## 2019-12-29 LAB — STREP PNEUMONIAE URINARY ANTIGEN: Strep Pneumo Urinary Antigen: NEGATIVE

## 2019-12-29 LAB — C-REACTIVE PROTEIN: CRP: 5.1 mg/dL — ABNORMAL HIGH (ref <1.0)

## 2019-12-29 LAB — PREPARE RBC (CROSSMATCH)

## 2019-12-29 LAB — FERRITIN: Ferritin: 312 ng/mL (ref 24–336)

## 2019-12-29 MED ORDER — VITAL AF 1.2 CAL PO LIQD
1000.0000 mL | ORAL | Status: DC
  Administered 2019-12-29: 1000 mL

## 2019-12-29 MED ORDER — INSULIN ASPART 100 UNIT/ML ~~LOC~~ SOLN: 2.0000 [IU] | SUBCUTANEOUS | Status: DC

## 2019-12-29 MED ORDER — ATORVASTATIN CALCIUM 10 MG PO TABS
20.0000 mg | ORAL_TABLET | Freq: Every day | ORAL | Status: DC
  Administered 2019-12-29 – 2020-01-01 (×4): 20 mg via ORAL
  Filled 2019-12-29 (×4): qty 2

## 2019-12-29 MED ORDER — FUROSEMIDE 10 MG/ML IJ SOLN
80.0000 mg | Freq: Once | INTRAMUSCULAR | Status: AC
  Administered 2019-12-29: 80 mg via INTRAVENOUS
  Filled 2019-12-29: qty 8

## 2019-12-29 MED ORDER — PANCRELIPASE (LIP-PROT-AMYL) 12000-38000 UNITS PO CPEP
24000.0000 [IU] | ORAL_CAPSULE | Freq: Three times a day (TID) | ORAL | Status: DC
  Administered 2019-12-29 – 2019-12-31 (×7): 24000 [IU] via ORAL
  Filled 2019-12-29 (×9): qty 2

## 2019-12-29 MED ORDER — SODIUM CHLORIDE 0.9 % IV SOLN
1.0000 g | INTRAVENOUS | Status: DC
  Administered 2019-12-29 – 2019-12-31 (×3): 1 g via INTRAVENOUS
  Filled 2019-12-29 (×3): qty 10

## 2019-12-29 MED ORDER — INSULIN ASPART 100 UNIT/ML ~~LOC~~ SOLN
1.0000 [IU] | SUBCUTANEOUS | Status: DC
  Administered 2019-12-30: 1 [IU] via SUBCUTANEOUS
  Administered 2019-12-31: 2 [IU] via SUBCUTANEOUS
  Administered 2019-12-31: 3 [IU] via SUBCUTANEOUS
  Administered 2019-12-31: 1 [IU] via SUBCUTANEOUS

## 2019-12-29 MED ORDER — DEXTROSE 10 % IV SOLN: INTRAVENOUS | Status: DC | PRN

## 2019-12-29 MED ORDER — INSULIN DETEMIR 100 UNIT/ML ~~LOC~~ SOLN
8.0000 [IU] | Freq: Two times a day (BID) | SUBCUTANEOUS | Status: DC
  Administered 2019-12-29 (×2): 8 [IU] via SUBCUTANEOUS
  Filled 2019-12-29 (×6): qty 0.08

## 2019-12-29 MED ORDER — SODIUM CHLORIDE 0.9 % IV SOLN
500.0000 mg | INTRAVENOUS | Status: AC
  Administered 2019-12-29 – 2019-12-31 (×3): 500 mg via INTRAVENOUS
  Filled 2019-12-29 (×3): qty 500

## 2019-12-29 MED ORDER — SODIUM CHLORIDE 0.9 % IV SOLN: 2.0000 g | Freq: Three times a day (TID) | INTRAVENOUS | Status: DC

## 2019-12-29 MED ORDER — SODIUM CHLORIDE 0.9% IV SOLUTION: Freq: Once | INTRAVENOUS | Status: AC

## 2019-12-29 MED ORDER — DEXTROSE 50 % IV SOLN
50.0000 mL | Freq: Once | INTRAVENOUS | Status: AC
  Administered 2019-12-29: 50 mL via INTRAVENOUS

## 2019-12-29 NOTE — Progress Notes (Signed)
CRITICAL VALUE ALERT  Critical Value:  Hgb 6.7  Date & Time Notied:  12/29/19 0120  Provider Notified: elink  Orders Received/Actions taken: awaiting further orders

## 2019-12-29 NOTE — Progress Notes (Addendum)
eLink Physician-Brief Progress Note Patient Name: Rodney Barber DOB: 08/05/1966 MRN: [id number]   Date of Service  12/29/2019  HPI/Events of Note  Hemoglobin 6.7, no bleeding reported and no other symptoms/vitals instability reported  eICU Interventions  1 unit RBC and post transfusion H/H ordered RN to take consent     Intervention Category Intermediate Interventions: Diagnostic test evaluation  Johara Lodwick G Thedford Bunton 12/29/2019, 1:28 AM   2.30 am RN asked if endotool can be transitioned to SSI Bicarb is only 16 on last set of labs, AG was 13 Patient NPO, and on dextrose containing fluids Asked to check 5 am labs and call us so we can decide  For now, continue endotool

## 2019-12-29 NOTE — Progress Notes (Signed)
NAME:  Rodney Barber, MRN:  [id number], DOB:  Sep 14, 1965, LOS: 1 ADMISSION DATE:  12/28/2019, CONSULTATION DATE:  12/28/19 REFERRING MD: Randal Buba - EM  , CHIEF COMPLAINT:  SOB, hypoxia   Brief History   54 yo M presenting with 5d hx SOB, progressive. S/p 1 dose COVID-19 vaccine, has not had second. CXR concerning, with bilat opacities. ETT in ED. CTA chest pending, COVID pending   History of present illness   54 yo M PMH alcoholic pancreatitis, htn, DM2 poorly controlled, homelessness, presents to [facility] 5/22 with CC SOB, cough. Symptoms began several days ago, and are progressive in nature. Pt has received 1 dose of covid vaccine, approx 2 weeks ago, but has not yet received 2nd dose. Pt denies HA neck pain chest pain, n/v/d/abd pain. Endorses tobacco use. Denies known sick contacts.   In ED, pt hypoxic, SpO2 70s on RA. Initially, placed on NRB with SpO2 improvement to 90%. Progressive resp distress and hypoxia  however and decision made to emergently intubate. CXR with bilateral opacities. COVID is pending.   Presenting labs significant for  Na 134, K 4.2, CO2 17 Gu 526 Alk phos 221 AST 35 ALT 24 Ammonia 25 TBili 1.4 BN 544, LDH 292, hsTrop 54 CRP 6.6 ddimer 3.06 fibrinogen >800 WBC 9.9 LA 2.3 PCT 0.12  CT A chest pending  Past Medical History  Alcoholism Alcoholic pancreatitis DM, poorly controlled HTN  Severe protein calorie malnutrition Transaminitis   Significant Hospital Events   5/23 presents to ED with cough SOB hypoxia, intubated   Consults:    Procedures:  5/23 ETT>>   Significant Diagnostic Tests:  5/23 CXR> bilateral multifocal opacities. Possible small bilateral pleural effusions  5/23 CTA chest>> bilateral parenchymal opacities with large pleural effusions 5/23 CT a/p >> chronic changes of pancreatitis 5/23 ECHO: LVEF 35-40% with global hypokinesis and normal RV and RV function  Micro Data:  5/23 SARS Cov2 PCR>>  5/23 tracheal aspirate >> 5/23 RVP >>    Antimicrobials:  5/23 cefepime >> empirically for PNA   Interim history/subjective:  Bedside ultrasound with bilateral pleural effusions L>R Remains on Endotool overnight Received 1 unit PRBC for anemia, no evidence of bleeding per RN or on exam   Objective   Blood pressure 95/70, pulse (!) 56, temperature 97.6 F (36.4 C), temperature source Oral, resp. rate 20, weight 60.3 kg, SpO2 100 %.    Vent Mode: PRVC FiO2 (%):  [40 %-90 %] (P) 40 % Set Rate:  [20 bmp] 20 bmp Vt Set:  [540 mL-550 mL] 540 mL PEEP:  [5 cmH20] 5 cmH20 Plateau Pressure:  [16 cmH20-21 cmH20] 18 cmH20   Intake/Output Summary (Last 24 hours) at 12/29/2019 1032 Last data filed at 12/29/2019 5102 Gross per 24 hour  Intake 2827.69 ml  Output 2005 ml  Net 822.69 ml   Filed Weights   12/28/19 0500 12/29/19 0128  Weight: 62 kg 60.3 kg    Examination: General: Adult male intubated and sedated HENT: [place]/AT. Trachea midline. ETT inplace. Lungs: Synchronous with vent, minimal secretions via ETT Cardiovascular: RRR s1s2.  Warm and well perfused. + edema to lower extremities.  Abdomen: soft flat ndnt. + bowel sounds  Extremities: No deformities Neuro: Sedated RASS -2.  GU: Scrotal edema  Resolved Hospital Problem list     Assessment & Plan:   Acute hypoxemic resp failure requiring mechanical ventilation -suspect multifactorial with volume overload and possible superimposed PNA -CXR with bilat opacities -COVID PCR is pending per ID -BNP elevated P -  maintain airborne/contact for now.  -Follow up COVID-19 PCR, RVP and tracheal aspirate culture -Continue empiric Cefepime -Start diuresis today  -Continue full vent support with lung protective strategies.    Acute kidney injury -in setting of distended bladder, HFrEF and volume overload P: -Continue foley -Diuresis  Chronic acoholic pancreatitis   -Lipase normal P -cont home pancreatic enzymes  NAGMA with lactic acidosis  -likely partially in  setting of chronic pancreatitis  P -Discontinue IVF in setting of volume overload  DM2, poorfly controlled with acute hyperglycemia  -insulin gtt initiated in ED. GAP closed -home reg is levemir 15u BID P -Transition off Endotool, SSI q 4 with Levemir (reduced from home dose)  Normocytic anemia  P -Trend CBC, goal hgb > 7 -s/p 1 unit PRBC 5/24 -continue to monitor for signs of bleeding.  Bladder distention P -foley  H/o HTN -Hold home lisinopril and norvasc  Best practice:  Diet: Pending tube feeds per nutrition Pain/Anxiety/Delirium protocol (if indicated): Propofol, fentanyl VAP protocol (if indicated): Yes  DVT prophylaxis: lovenox  GI prophylaxis: protonix  Glucose control: SSI  Mobility: BR Code Status: Default is Full at present consistent with most recent admit code status (unclear if this was Full by default or if conversation was had)--  notably patient is presently intubated,  however I see recent documentation that patient expressed DNR/DNI wishes in march and February of 2021 so we will aim to clarify this.   Family Communication: Brother, Matheson Vandehei. Updated this am.  Disposition: ICU   Labs   CBC: Recent Labs  Lab 12/28/19 0512 12/28/19 0609 12/28/19 0752 12/29/19 0041 12/29/19 0929  WBC 9.9  --   --  11.6* 14.4*  NEUTROABS 8.6*  --   --   --   --   HGB 8.5* 9.5* 7.8* 6.7* 9.7*  HCT 28.4* 28.0* 23.0* 20.9* 30.9*  MCV 96.3  --   --  92.1 93.9  PLT 311  --   --  300 188    Basic Metabolic Panel: Recent Labs  Lab 12/28/19 1211 12/28/19 1436 12/28/19 2008 12/29/19 0041 12/29/19 0929  NA 134* 137 140 141 138  K 4.8 3.9 4.1 4.1 4.3  CL 105 109 113* 112* 114*  CO2 13* 15* 17* 16* 16*  GLUCOSE 607* 520* 205* 106* 107*  BUN 28* 28* 29* 29* 30*  CREATININE 1.39* 1.29* 1.22 1.22 1.25*  CALCIUM 8.0* 7.6* 7.6* 7.8* 7.7*  MG 1.9  --   --  1.7  --   PHOS 5.9*  --   --  4.4  --    GFR: Estimated Creatinine Clearance: 57 mL/min (A) (by C-G  formula based on SCr of 1.25 mg/dL (H)). Recent Labs  Lab 12/28/19 0512 12/28/19 0650 12/28/19 1213 12/29/19 0041 12/29/19 0929  PROCALCITON 0.12  --   --   --   --   WBC 9.9  --   --  11.6* 14.4*  LATICACIDVEN 2.3* 2.8* 2.6*  --   --     Liver Function Tests: Recent Labs  Lab 12/28/19 0512  AST 35  ALT 24  ALKPHOS 221*  BILITOT 1.4*  PROT 6.4*  ALBUMIN 2.3*   Recent Labs  Lab 12/28/19 2008 12/29/19 0041  LIPASE 14 15   Recent Labs  Lab 12/28/19 0512  AMMONIA 25    ABG    Component Value Date/Time   PHART 7.256 (L) 12/28/2019 0752   PCO2ART 33.8 12/28/2019 0752   PO2ART 120 (H) 12/28/2019 4166  HCO3 15.0 (L) 12/28/2019 0752   TCO2 16 (L) 12/28/2019 0752   ACIDBASEDEF 11.0 (H) 12/28/2019 0752   O2SAT 98.0 12/28/2019 0752     Coagulation Profile: No results for input(s): INR, PROTIME in the last 168 hours.  Cardiac Enzymes: No results for input(s): CKTOTAL, CKMB, CKMBINDEX, TROPONINI in the last 168 hours.  HbA1C: HbA1c, POC (controlled diabetic range)  Date/Time Value Ref Range Status  03/15/2018 10:14 AM 14.4 (A) 0.0 - 7.0 % Final   Hgb A1c MFr Bld  Date/Time Value Ref Range Status  11/14/2019 05:09 PM 13.9 (H) 4.8 - 5.6 % Final    Comment:    (NOTE)         Prediabetes: 5.7 - 6.4         Diabetes: >6.4         Glycemic control for adults with diabetes: <7.0   09/10/2019 05:29 AM >18.5 (H) 4.8 - 5.6 % Final    CBG: Recent Labs  Lab 12/29/19 0553 12/29/19 0653 12/29/19 0747 12/29/19 0851 12/29/19 0946  GLUCAP 110* 105* 95 116* 109*    Review of Systems:   Unable to obtain intubated, sedated  Past Medical History  He,  has a past medical history of Diabetes mellitus, Hypertension, and Pancreatitis.     CCT [place], ACNP [facility]   After hours pager: [phone]

## 2019-12-29 NOTE — Progress Notes (Addendum)
Initial Nutrition Assessment  DOCUMENTATION CODES:   Not applicable  INTERVENTION:   Initiate tube feeding via OG tube: Vital AF 1.2 at 25 ml/h, increase by 10 ml every 4 hours to goal rate of 55 ml/h (1320 ml per day)  Provides 1584 kcal (1679 kcal total with propofol), 99 gm protein, 1071 ml free water daily  NUTRITION DIAGNOSIS:   Inadequate oral intake related to inability to eat as evidenced by NPO status.  GOAL:   Patient will meet greater than or equal to 90% of their needs  MONITOR:   Vent status, Labs, TF tolerance  REASON FOR ASSESSMENT:   Ventilator, Consult Enteral/tube feeding initiation and management  ASSESSMENT:   54 yo male admitted with progressive SOB x 5 days. Intubated on admission. PMH includes alcoholic pancreatitis, HTN, DM-2, homelessness.   Discussed patient in ICU rounds and with RN today. Chest x-ray revealed bilateral opacities. Initial COVID test negative, repeat COVID test results pending. He has had 1 dose of COVID vaccine. Received MD Consult for TF initiation and management. OG tube in place.  Patient is currently intubated on ventilator support MV: 10.1 L/min Temp (24hrs), Avg:96.3 F (35.7 C), Min:93 F (33.9 C), Max:97.9 F (36.6 C)  Propofol: 3.6 ml/hr providing 95 kcal from lipid.   Labs reviewed.  CBG: 951 045 1906  Medications reviewed and include lasix, novolog, levemir, pancrelipase, propofol.  Weight is down by 3% since 12/10/19.  NUTRITION - FOCUSED PHYSICAL EXAM:  unable to complete  Diet Order:   Diet Order            Diet NPO time specified  Diet effective now              EDUCATION NEEDS:   Not appropriate for education at this time  Skin:  Skin Assessment: Reviewed RN Assessment  Last BM:  no BM documented  Height:   Ht Readings from Last 1 Encounters:  12/10/19 5\' 8"  (1.727 m)    Weight:   Wt Readings from Last 1 Encounters:  12/29/19 60.3 kg    Ideal Body Weight:  70 kg  BMI:   Body mass index is 20.21 kg/m.  Estimated Nutritional Needs:   Kcal:  1640  Protein:  90-115 gm  Fluid:  >/= 1.8 L    12/31/19, RD, LDN, CNSC Please refer to Amion for contact information.

## 2019-12-30 ENCOUNTER — Inpatient Hospital Stay (HOSPITAL_COMMUNITY): Payer: Self-pay

## 2019-12-30 DIAGNOSIS — Z9889 Other specified postprocedural states: Secondary | ICD-10-CM

## 2019-12-30 DIAGNOSIS — I10 Essential (primary) hypertension: Secondary | ICD-10-CM

## 2019-12-30 DIAGNOSIS — Z720 Tobacco use: Secondary | ICD-10-CM

## 2019-12-30 DIAGNOSIS — I5043 Acute on chronic combined systolic (congestive) and diastolic (congestive) heart failure: Secondary | ICD-10-CM

## 2019-12-30 DIAGNOSIS — I5042 Chronic combined systolic (congestive) and diastolic (congestive) heart failure: Secondary | ICD-10-CM

## 2019-12-30 LAB — GLUCOSE, CAPILLARY
Glucose-Capillary: 41 mg/dL — CL (ref 70–99)
Glucose-Capillary: 108 mg/dL — ABNORMAL HIGH (ref 70–99)
Glucose-Capillary: 89 mg/dL (ref 70–99)
Glucose-Capillary: 140 mg/dL — ABNORMAL HIGH (ref 70–99)
Glucose-Capillary: 60 mg/dL — ABNORMAL LOW (ref 70–99)
Glucose-Capillary: 137 mg/dL — ABNORMAL HIGH (ref 70–99)
Glucose-Capillary: 123 mg/dL — ABNORMAL HIGH (ref 70–99)

## 2019-12-30 LAB — BASIC METABOLIC PANEL
Anion gap: 11 (ref 5–15)
BUN: 31 mg/dL — ABNORMAL HIGH (ref 6–20)
CO2: 17 mmol/L — ABNORMAL LOW (ref 22–32)
Calcium: 7.8 mg/dL — ABNORMAL LOW (ref 8.9–10.3)
Chloride: 114 mmol/L — ABNORMAL HIGH (ref 98–111)
Creatinine, Ser: 1.18 mg/dL (ref 0.61–1.24)
GFR calc Af Amer: >60 mL/min (ref >60)
GFR calc non Af Amer: >60 mL/min (ref >60)
Glucose, Bld: 130 mg/dL — ABNORMAL HIGH (ref 70–99)
Potassium: 3.5 mmol/L (ref 3.5–5.1)
Sodium: 142 mmol/L (ref 135–145)

## 2019-12-30 LAB — PROTEIN, TOTAL
Total Protein: 6 g/dL — ABNORMAL LOW (ref 6.5–8.1)
Total Protein: 6.2 g/dL — ABNORMAL LOW (ref 6.5–8.1)

## 2019-12-30 LAB — PROTEIN, PLEURAL OR PERITONEAL FLUID
Total protein, fluid: <3 g/dL
Total protein, fluid: <3 g/dL

## 2019-12-30 LAB — CBC
HCT: 35.4 % — ABNORMAL LOW (ref 39.0–52.0)
Hemoglobin: 11.2 g/dL — ABNORMAL LOW (ref 13.0–17.0)
MCH: 29.5 pg (ref 26.0–34.0)
MCHC: 31.6 g/dL (ref 30.0–36.0)
MCV: 93.2 fL (ref 80.0–100.0)
Platelets: 315 10*3/uL (ref 150–400)
RBC: 3.8 MIL/uL — ABNORMAL LOW (ref 4.22–5.81)
RDW: 15 % (ref 11.5–15.5)
WBC: 16.4 10*3/uL — ABNORMAL HIGH (ref 4.0–10.5)
nRBC: 0 % (ref 0.0–0.2)

## 2019-12-30 LAB — LACTATE DEHYDROGENASE, PLEURAL OR PERITONEAL FLUID
LD, Fluid: 57 U/L — ABNORMAL HIGH (ref 3–23)
LD, Fluid: 64 U/L — ABNORMAL HIGH (ref 3–23)

## 2019-12-30 LAB — BODY FLUID CELL COUNT WITH DIFFERENTIAL
Eos, Fluid: 0 %
Eos, Fluid: 0 %
Lymphs, Fluid: 31 %
Lymphs, Fluid: 53 %
Monocyte-Macrophage-Serous Fluid: 6 % — ABNORMAL LOW (ref 50–90)
Monocyte-Macrophage-Serous Fluid: 9 % — ABNORMAL LOW (ref 50–90)
Neutrophil Count, Fluid: 41 % — ABNORMAL HIGH (ref 0–25)
Neutrophil Count, Fluid: 60 % — ABNORMAL HIGH (ref 0–25)
Total Nucleated Cell Count, Fluid: 178 cu mm (ref 0–1000)
Total Nucleated Cell Count, Fluid: 298 cu mm (ref 0–1000)

## 2019-12-30 LAB — MAGNESIUM
Magnesium: 1.8 mg/dL (ref 1.7–2.4)
Magnesium: 2.3 mg/dL (ref 1.7–2.4)

## 2019-12-30 LAB — LACTATE DEHYDROGENASE
LDH: 245 U/L — ABNORMAL HIGH (ref 98–192)
LDH: 241 U/L — ABNORMAL HIGH (ref 98–192)

## 2019-12-30 LAB — PHOSPHORUS
Phosphorus: 4.6 mg/dL (ref 2.5–4.6)
Phosphorus: 5 mg/dL — ABNORMAL HIGH (ref 2.5–4.6)

## 2019-12-30 LAB — NOVEL CORONAVIRUS, NAA (HOSP ORDER, SEND-OUT TO REF LAB; TAT 18-24 HRS): SARS-CoV-2, NAA: NOT DETECTED

## 2019-12-30 LAB — CHOLESTEROL, TOTAL: Cholesterol: 159 mg/dL (ref 0–200)

## 2019-12-30 LAB — LACTIC ACID, PLASMA: Lactic Acid, Venous: 1.6 mmol/L (ref 0.5–1.9)

## 2019-12-30 MED ORDER — DEXTROSE 250 MG/ML IV SOLN
12.5000 g | Freq: Once | INTRAVENOUS | Status: AC
  Filled 2019-12-30: qty 50

## 2019-12-30 MED ORDER — FUROSEMIDE 10 MG/ML IJ SOLN
80.0000 mg | Freq: Once | INTRAMUSCULAR | Status: AC
  Administered 2019-12-30: 80 mg via INTRAVENOUS
  Filled 2019-12-30: qty 8

## 2019-12-30 MED ORDER — MAGNESIUM SULFATE 2 GM/50ML IV SOLN
2.0000 g | Freq: Once | INTRAVENOUS | Status: AC
  Administered 2019-12-30: 2 g via INTRAVENOUS
  Filled 2019-12-30: qty 50

## 2019-12-30 MED ORDER — POTASSIUM CHLORIDE CRYS ER 20 MEQ PO TBCR
40.0000 meq | EXTENDED_RELEASE_TABLET | Freq: Once | ORAL | Status: AC
  Administered 2019-12-30: 40 meq via ORAL
  Filled 2019-12-30: qty 2

## 2019-12-30 MED ORDER — CARVEDILOL 3.125 MG PO TABS
3.1250 mg | ORAL_TABLET | Freq: Two times a day (BID) | ORAL | Status: DC
  Administered 2019-12-30 – 2019-12-31 (×2): 3.125 mg via ORAL
  Filled 2019-12-30 (×3): qty 1

## 2019-12-30 MED ORDER — LOSARTAN POTASSIUM 25 MG PO TABS
25.0000 mg | ORAL_TABLET | Freq: Every day | ORAL | Status: DC
  Administered 2019-12-30 – 2020-01-01 (×3): 25 mg via ORAL
  Filled 2019-12-30 (×3): qty 1

## 2019-12-30 NOTE — Progress Notes (Signed)
NAME:  Rodney Barber, MRN:  [id number], DOB:  Feb 21, 1966, LOS: 2 ADMISSION DATE:  12/28/2019, CONSULTATION DATE:  12/28/19 REFERRING MD: Randal Buba - EM  , CHIEF COMPLAINT:  SOB, hypoxia   Brief History   54 yo M presenting with 5d hx SOB, progressive. S/p 1 dose COVID-19 vaccine, has not had second. CXR concerning, with bilat opacities. ETT in ED. CTA chest pending, COVID pending   History of present illness   54 yo M PMH alcoholic pancreatitis, htn, DM2 poorly controlled, homelessness, presents to [facility] 5/22 with CC SOB, cough. Symptoms began several days ago, and are progressive in nature. Pt has received 1 dose of covid vaccine, approx 2 weeks ago, but has not yet received 2nd dose. Pt denies HA neck pain chest pain, n/v/d/abd pain. Endorses tobacco use. Denies known sick contacts.   In ED, pt hypoxic, SpO2 70s on RA. Initially, placed on NRB with SpO2 improvement to 90%. Progressive resp distress and hypoxia  however and decision made to emergently intubate. CXR with bilateral opacities. COVID is pending.   Presenting labs significant for  Na 134, K 4.2, CO2 17 Gu 526 Alk phos 221 AST 35 ALT 24 Ammonia 25 TBili 1.4 BN 544, LDH 292, hsTrop 54 CRP 6.6 ddimer 3.06 fibrinogen >800 WBC 9.9 LA 2.3 PCT 0.12  CT A chest pending  Past Medical History  Alcoholism Alcoholic pancreatitis DM, poorly controlled HTN  Severe protein calorie malnutrition Transaminitis   Significant Hospital Events   5/23 presents to ED with cough SOB hypoxia, intubated   Consults:    Procedures:  5/23 ETT>>   Significant Diagnostic Tests:  5/23 CXR> bilateral multifocal opacities. Possible small bilateral pleural effusions  5/23 CTA chest>> bilateral parenchymal opacities with large pleural effusions 5/23 CT a/p >> chronic changes of pancreatitis 5/23 ECHO: LVEF 35-40% with global hypokinesis and normal RV and RV function  Micro Data:  5/23 SARS Cov2 PCR>>  5/23 tracheal aspirate >> 5/23 RVP >>    Antimicrobials:  5/23 cefepime >> empirically for PNA   Interim history/subjective:  No events, awake, wants tube out.  Objective   Blood pressure (!) 159/99, pulse 81, temperature (!) 97.5 F (36.4 C), temperature source Oral, resp. rate (!) 21, weight 61.8 kg, SpO2 95 %.    Vent Mode: PRVC FiO2 (%):  [40 %] 40 % Set Rate:  [20 bmp] 20 bmp Vt Set:  [540 mL] 540 mL PEEP:  [5 cmH20] 5 cmH20 Plateau Pressure:  [17 cmH20-21 cmH20] 19 cmH20   Intake/Output Summary (Last 24 hours) at 12/30/2019 0941 Last data filed at 12/30/2019 0900 Gross per 24 hour  Intake 1268.53 ml  Output 1525 ml  Net -256.47 ml   Filed Weights   12/28/19 0500 12/29/19 0128 12/30/19 0429  Weight: 62 kg 60.3 kg 61.8 kg    Examination: GEN: thin man in NAD HEENT: ETT in place minimal secretions CV: RRR, ext warm PULM: Diminished bases, occasional crackles, good TV on PS GI: Firm, nontender, hypoactive BS EXT: Muscle wasting, +anasarca NEURO: Moves all 4 ext to command PSYCH: RASS 0  SKIN: No rashes  Had a low blood sugar this AM Pct minimally elevated No AM labs? Resolved Hospital Problem list     Assessment & Plan:   Acute hypoxemic respiratory failure: multifactorial related to volume overloaded state of heart, chronically poor serum oncotic pressure leading to third spacing, potential pneumonia with patchy bronchocentric airspace disease, and finally DKA.  P - Await AM BMP then redose diuretics -  May take a look at effusions at bedside and consider tap if having any further issues - CAP abx as ordered - SAT/SBT and extubation  Chronic acoholic pancreatitis   -Lipase normal P -cont home pancreatic enzymes  NAGMA with lactic acidosis  -likely partially in setting of chronic pancreatitis  P -Await AM labs  DM2 with DKA, brittle, hypoglycemic with levemir P -SSI alone for now then calculate requirements  Normocytic anemia  P -f/u AM CBC  Bladder distention P -voiding  trial  H/o HTN -Hold home lisinopril and norvasc  Best practice:  Diet: Pending tube feeds per nutrition Pain/Anxiety/Delirium protocol (if indicated): Propofol, fentanyl VAP protocol (if indicated): Yes  DVT prophylaxis: lovenox  GI prophylaxis: protonix  Glucose control: SSI  Mobility: BR Code Status: Full but needs GOC discussion Family Communication: Will update patient Disposition: ICU      The patient is critically ill with multiple organ systems failure and requires high complexity decision making for assessment and support, frequent evaluation and titration of therapies, application of advanced monitoring technologies and extensive interpretation of multiple databases. Critical Care Time devoted to patient care services described in this note independent of APP/resident time (if applicable)  is 32 minutes.   Erskine Emery MD [facility] 12/30/2019 9:52 AM Personal pager: [phone] If unanswered, please page CCM On-call: [phone]

## 2019-12-30 NOTE — Progress Notes (Signed)
ID PROGRESS NOTE  Repeat covid testing from [facility] also negative   Will stop airborne/contact isolation  Keep on droplet/contact while intubated. Should he have AGP, recommend to wear N95 per policy.  Duke Salvia Drue Second MD MPH [facility] [phone]

## 2019-12-30 NOTE — Procedures (Signed)
Thoracentesis Procedure Note  Pre-operative Diagnosis: pleural effusion   Post-operative Diagnosis: same  Indications: evaluation of pleural fluid and treatment of dyspnea   Procedure Details  Consent: Informed consent was obtained. Risks of the procedure were discussed including: infection, bleeding, pain, pneumothorax.  Under sterile conditions the patient was positioned. Betadine solution and sterile drapes were utilized.  1% buffered lidocaine was used to anesthetize the  rib space which was identified via real time [place]. Fluid was obtained without any difficulties and minimal blood loss.  A dressing was applied to the wound and wound care instructions were provided.   Findings 600 ml of clear pleural fluid was obtained. A sample was sent to Pathology for cell counts, as well as for infection analysis.  Complications:  None; patient tolerated the procedure well.          Condition: stable  Plan A follow up chest x-ray was ordered. Bed Rest for 0 hours. Tylenol 650 mg. for pain.  Simonne Martinet ACNP-BC [facility] Pulmonary/Critical Care Pager # [phone] OR # [phone] if no answer

## 2019-12-30 NOTE — Procedures (Signed)
Extubation Procedure Note  Patient Details:   Name: Rodney Barber DOB: May 18, 1966 MRN: [id number]   Airway Documentation:    Vent end date: 12/30/19 Vent end time: 0945   Evaluation  O2 sats: stable throughout Complications: No apparent complications Patient did tolerate procedure well. Bilateral Breath Sounds: Clear   Yes  No stridor noted. Leak test positive.  [name] 12/30/2019, 9:51 AM

## 2019-12-30 NOTE — Procedures (Signed)
Thoracentesis Procedure Note  Pre-operative Diagnosis: pleural fluid   Post-operative Diagnosis: same  Indications: evaluation of fluid and treatment of dyspnea   Procedure Details  Consent: Informed consent was obtained. Risks of the procedure were discussed including: infection, bleeding, pain, pneumothorax.  Under sterile conditions the patient was positioned. Betadine solution and sterile drapes were utilized.  1% buffered lidocaine was used to anesthetize the  rib space which was identified via real time [place]. Fluid was obtained without any difficulties and minimal blood loss.  A dressing was applied to the wound and wound care instructions were provided.   Findings 675 ml of clear pleural fluid was obtained. A sample was sent to Pathology for cell counts, as well as for infection analysis.  Complications:  None; patient tolerated the procedure well.          Condition: stable  Plan A follow up chest x-ray was ordered. Bed Rest for 0 hours. Tylenol 650 mg. for pain.  Simonne Martinet ACNP-BC [facility] Pulmonary/Critical Care Pager # [phone] OR # [phone] if no answer

## 2019-12-30 NOTE — Consult Note (Addendum)
Cardiology Consultation:   Patient ID: Rodney Barber MRN: [id number]; DOB: September 08, 1965  Admit date: 12/28/2019 Date of Consult: 12/30/2019  Primary Care Provider: Elsie Stain, MD Primary Cardiologist: New to [facility]   Patient Profile:   Rodney Barber is a 54 y.o. male with a hx of diabetes mellitus, alcohol induced chronic pancreatitis, hypertension, tobacco abuse and homelessness who is being seen today for the evaluation of CHF at the request of Dr. Carlis Abbott.  Hx of MSSA bacteremia 05/2019. No endocarditis by TEE.  Prior multiple admission secondary to hyperglycemia.  Recently admitted overnight in April 2021 with acute symptomatic hypoglycemia in setting of medication and dietary noncompliance.  History of Present Illness:   [name] presented 5/23 with few days history of shortness of breath and worsening cough.  He got first Moderna covid vaccine approximately 2 weeks ago.  He was noted hypoxemic and CT of chest showed bilateral pulmonary infiltrates without evidence of pulmonary embolism.  He was intubated.  CT of abdomen showed chronic pancreatitis. COVID negative.    Echocardiogram 12/28/19 showed LV function of 35 to 40%, global hypokinesis, normal RV function. LVEF was 40-45% by echo 10/22/19. LVEF of 55-60% by echo 05/20/19. LVEF of 45-50% in 11/2018.  Initially treated with fluids Got IV lasix 96m x 1 yesterday  Net I & O +815cc LDH 245 Scr 1.18  Patient was extubated this morning and underwent bilateral thoracentesis with removal of at least 600 mL of clear pleural fluid each side.   Pending pathology.  Follow-up chest x-ray without pneumothorax.  Patient reports chest pressure with activity 2 days prior to presentation. Had orthopnea. Denies prior chest pain. No syncope.   Past Medical History:  Diagnosis Date  . Diabetes mellitus   . Hypertension   . Pancreatitis     Past Surgical History:  Procedure Laterality Date  . ERCP  06/27/2011   Procedure:  ENDOSCOPIC RETROGRADE CHOLANGIOPANCREATOGRAPHY (ERCP);  Surgeon: MJeryl Columbia MD;  [facility];  Service: Endoscopy;  Laterality: N/A;  . ERCP W/ PLASTIC STENT PLACEMENT  03/2011  . IR [place] BX ASP/DRAIN  05/16/2019  . TEE WITHOUT CARDIOVERSION N/A 05/20/2019   Procedure: TRANSESOPHAGEAL ECHOCARDIOGRAM (TEE);  Surgeon: HPixie Casino MD;  [facility];  Service: Cardiovascular;  Laterality: N/A;     Inpatient Medications: Scheduled Meds: . atorvastatin  20 mg Oral q1800  . chlorhexidine gluconate (MEDLINE KIT)  15 mL Mouth Rinse BID  . Chlorhexidine Gluconate Cloth  6 each Topical Daily  . enoxaparin (LOVENOX) injection  40 mg Subcutaneous Q24H  . furosemide  80 mg Intravenous Once  . insulin aspart  1-3 Units Subcutaneous Q4H  . lipase/protease/amylase  24,000 Units Oral TID WC  . mouth rinse  15 mL Mouth Rinse 10 times per day  . pantoprazole (PROTONIX) IV  40 mg Intravenous QHS  . potassium chloride  40 mEq Oral Once   Continuous Infusions: . sodium chloride Stopped (12/29/19 0506)  . azithromycin 500 mg (12/30/19 1343)  . cefTRIAXone (ROCEPHIN)  IV 1 g (12/30/19 1338)  . magnesium sulfate bolus IVPB 2 g (12/30/19 1344)   PRN Meds: sodium chloride, acetaminophen, dextrose, docusate sodium, ondansetron (ZOFRAN) IV, polyethylene glycol  Allergies:   No Known Allergies  Social History:   Social History   Socioeconomic History  . Marital status: Single    Spouse name: Not on file  . Number of children: Not on file  . Years of education: Not on file  . Highest education  level: Not on file  Occupational History  . Not on file  Tobacco Use  . Smoking status: Current Every Day Smoker    Packs/day: 0.50    Years: 30.00    Pack years: 15.00    Types: Cigarettes  . Smokeless tobacco: Never Used  . Tobacco comment: 6 cigarett /day  Substance and Sexual Activity  . Alcohol use: No  . Drug use: Not Currently    Types: "Crack" cocaine, Other-see  comments, Cocaine    Comment: last smoked crack 12-16-16  . Sexual activity: Never  Other Topics Concern  . Not on file  Social History Narrative  . Not on file   Social Determinants of Health   Financial Resource Strain:   . Difficulty of Paying Living Expenses:   Food Insecurity:   . Worried About Charity fundraiser in the Last Year:   . Arboriculturist in the Last Year:   Transportation Needs:   . Film/video editor (Medical):   Marland Kitchen Lack of Transportation (Non-Medical):   Physical Activity:   . Days of Exercise per Week:   . Minutes of Exercise per Session:   Stress:   . Feeling of Stress :   Social Connections:   . Frequency of Communication with Friends and Family:   . Frequency of Social Gatherings with Friends and Family:   . Attends Religious Services:   . Active Member of Clubs or Organizations:   . Attends Archivist Meetings:   Marland Kitchen Marital Status:   Intimate Partner Violence:   . Fear of Current or Ex-Partner:   . Emotionally Abused:   Marland Kitchen Physically Abused:   . Sexually Abused:     Family History:   Family History  Problem Relation Age of Onset  . Diabetes Mother   . Diabetes Brother      ROS:  Please see the history of present illness.  All other ROS reviewed and negative.     Physical Exam/Data:   Vitals:   12/30/19 1000 12/30/19 1100 12/30/19 1135 12/30/19 1300  BP: (!) 157/88 (!) 152/81  (!) 172/94  Pulse: 90 95  95  Resp: 13 (!) 22  17  Temp:   97.9 F (36.6 C)   TempSrc:   Oral   SpO2: 93% 96%  99%  Weight:        Intake/Output Summary (Last 24 hours) at 12/30/2019 1421 Last data filed at 12/30/2019 0900 Gross per 24 hour  Intake 1069.12 ml  Output 1300 ml  Net -230.88 ml   Last 3 Weights 12/30/2019 12/29/2019 12/28/2019  Weight (lbs) 136 lb 3.9 oz 132 lb 15 oz 136 lb 11 oz  Weight (kg) 61.8 kg 60.3 kg 62 kg     Body mass index is 20.72 kg/m.  General:  Well nourished, well developed, in no acute distress HEENT:  normal Lymph: no adenopathy Neck: no JVD Endocrine:  No thryomegaly Vascular: No carotid bruits; FA pulses 2+ bilaterally without bruits  Cardiac:  normal S1, S2; RRR; no murmur  Lungs:  Diminished breath sound at base' Abd: soft, nontender, no hepatomegaly  Ext: no edema Musculoskeletal:  No deformities, BUE and BLE strength normal and equal Skin: warm and dry  Neuro:  CNs 2-12 intact, no focal abnormalities noted Psych:  Normal affect   EKG:  The EKG 12/18/19 was personally reviewed and demonstrates:  Sinus tachycardia at rate of 103 bpm Telemetry:  Telemetry was personally reviewed and demonstrates:  Sinus rhythm at  50-60s  Relevant CV Studies:  Echo 12/28/19 1. Left ventricular ejection fraction, by estimation, is 35 to 40%. The  left ventricle has moderately decreased function. The left ventricle  demonstrates global hypokinesis. There is moderate concentric left  ventricular hypertrophy. Left ventricular  diastolic function could not be evaluated.  2. Right ventricular systolic function is normal. The right ventricular  size is normal. There is normal pulmonary artery systolic pressure.  3. The mitral valve is normal in structure. Trivial mitral valve  regurgitation.  4. The aortic valve is tricuspid. Aortic valve regurgitation is not  visualized.  5. The inferior vena cava is dilated in size with >50% respiratory  variability, suggesting right atrial pressure of 8 mmHg.   Echo 10/22/19 . Left ventricular ejection fraction, by estimation, is 40 to 45%. The  left ventricle has mildly decreased function. The left ventricle  demonstrates global hypokinesis. Left ventricular diastolic parameters are  consistent with Grade I diastolic  dysfunction (impaired relaxation).  2. Right ventricular systolic function is normal. The right ventricular  size is normal. Tricuspid regurgitation signal is inadequate for assessing  PA pressure.  3. The mitral valve is normal in  structure. No evidence of mitral valve  regurgitation.  4. The aortic valve is tricuspid. Aortic valve regurgitation is trivial.  No aortic stenosis is present. There is a small filamentous mobile  echodensity on ventricular side of right coronary cusp, unchanged from  prior TTE on 05/15/19  5. The inferior vena cava is normal in size with <50% respiratory  variability, suggesting right atrial pressure of 8 mmHg.   Laboratory Data:  High Sensitivity Troponin:   Recent Labs  Lab 12/28/19 0512 12/28/19 0643  TROPONINIHS 54* 64*     Chemistry Recent Labs  Lab 12/29/19 0041 12/29/19 0929 12/30/19 1125  NA 141 138 142  K 4.1 4.3 3.5  CL 112* 114* 114*  CO2 16* 16* 17*  GLUCOSE 106* 107* 130*  BUN 29* 30* 31*  CREATININE 1.22 1.25* 1.18  CALCIUM 7.8* 7.7* 7.8*  GFRNONAA >60 >60 >60  GFRAA >60 >60 >60  ANIONGAP 13 8 11     Recent Labs  Lab 12/28/19 0512 12/30/19 1206  PROT 6.4* 6.0*  ALBUMIN 2.3*  --   AST 35  --   ALT 24  --   ALKPHOS 221*  --   BILITOT 1.4*  --    Hematology Recent Labs  Lab 12/29/19 0041 12/29/19 0929 12/30/19 1103  WBC 11.6* 14.4* 16.4*  RBC 2.27* 3.29* 3.80*  HGB 6.7* 9.7* 11.2*  HCT 20.9* 30.9* 35.4*  MCV 92.1 93.9 93.2  MCH 29.5 29.5 29.5  MCHC 32.1 31.4 31.6  RDW 14.2 13.9 15.0  PLT 300 299 315   BNP Recent Labs  Lab 12/28/19 0512  BNP 544.4*    DDimer  Recent Labs  Lab 12/28/19 0512 12/29/19 0041  DDIMER 3.06* 1.64*     Radiology/Studies:  DG Chest 1 View  Result Date: 12/28/2019 CLINICAL DATA:  Post intubation EXAM: CHEST  1 VIEW COMPARISON:  12/28/2019 at 0430 hours FINDINGS: Endotracheal tube terminates 3 cm above the carina. Enteric tube terminates in the gastric cardia. Multifocal pneumonia, lingular and lower lobe predominant. Suspected small left pleural effusion. The heart is normal in size.  Thoracic aortic atherosclerosis. IMPRESSION: Endotracheal tube terminates 3 cm above the carina. Enteric tube  terminates in the gastric cardia. Multifocal pneumonia, lower lobe predominant. Electronically Signed   By: Julian Hy M.D.   On: 12/28/2019 06:15  CT Angio Chest PE W and/or Wo Contrast  Result Date: 12/28/2019 CLINICAL DATA:  Shortness of breath and abdominal distension and pain, initial encounter EXAM: CT ANGIOGRAPHY CHEST CT ABDOMEN AND PELVIS WITH CONTRAST TECHNIQUE: Multidetector CT imaging of the chest was performed using the standard protocol during bolus administration of intravenous contrast. Multiplanar CT image reconstructions and MIPs were obtained to evaluate the vascular anatomy. Multidetector CT imaging of the abdomen and pelvis was performed using the standard protocol during bolus administration of intravenous contrast. CONTRAST:  158m OMNIPAQUE IOHEXOL 350 MG/ML SOLN COMPARISON:  10/27/2019 FINDINGS: CTA CHEST FINDINGS Cardiovascular: Thoracic aorta demonstrates no aneurysmal dilatation or dissection. Mild atherosclerotic calcifications are seen. The pulmonary artery shows a normal branching pattern. No definitive pulmonary embolism is seen. Coronary calcifications are noted. Mediastinum/Nodes: Thoracic inlet demonstrates endotracheal tube in satisfactory position. Gastric catheter extends into the stomach. The esophagus as visualized is within normal limits. Few scattered small mediastinal lymph nodes are seen. Small hilar adenopathy is present as well although incompletely evaluated due to significant consolidation and surrounding soft tissue particularly in the lower lobes. Lungs/Pleura: Large bilateral pleural effusions are noted. Patchy airspace opacity is noted throughout both lungs with increased consolidation in the lower lobes bilaterally. This is consistent with multifocal pneumonia. These changes have progressed significantly in the interval from the prior exam no pneumothorax is noted. Musculoskeletal: Degenerative changes of the thoracic spine are noted. Review of the MIP  images confirms the above findings. CT ABDOMEN and PELVIS FINDINGS Hepatobiliary: No focal liver abnormality is seen. No gallstones, gallbladder wall thickening, or biliary dilatation. Pancreas: Scattered calcifications are noted throughout the pancreas with pancreatic ductal dilatation consistent with chronic pancreatitis. These changes are stable from the prior CT examination. Spleen: Normal in size without focal abnormality. Adrenals/Urinary Tract: Adrenal glands appear within normal limits. Kidneys demonstrate bilateral renal calculi worse on the left than the right but similar to that seen on prior CT examination. Mild fullness of the collecting systems is noted although felt to be related to significant over distention of the bladder as no definitive ureteral stones are seen. Stomach/Bowel: No obstructive or inflammatory changes of the colon are seen. The appendix is within normal limits. No small bowel or gastric abnormality is seen. Vascular/Lymphatic: Aortic atherosclerosis. No enlarged abdominal or pelvic lymph nodes. Reproductive: Prostate is unremarkable. Other: No abdominal wall hernia or abnormality. No abdominopelvic ascites. Musculoskeletal: Degenerative changes of lumbar spine are noted. Review of the MIP images confirms the above findings. IMPRESSION: CTA of the chest: No evidence of pulmonary emboli. Significant increase in bilateral parenchymal opacities with bilateral lower lobe consolidation and large effusions bilaterally. These changes have progressed from the prior exam and are consistent with multifocal pneumonia and reactive effusions. Tubes and lines in satisfactory position. CT of the abdomen and pelvis: Chronic changes of pancreatitis with ductal dilatation. This is stable from the previous exam. Bilateral renal calculi worse on the left also stable from the prior exam. The bladder is over distended. No other focal abnormalities in the abdomen and pelvis are seen. Aortic Atherosclerosis  (ICD10-I70.0). Electronically Signed   By: MInez CatalinaM.D.   On: 12/28/2019 09:45   CT ABDOMEN PELVIS W CONTRAST  Result Date: 12/28/2019 CLINICAL DATA:  Shortness of breath and abdominal distension and pain, initial encounter EXAM: CT ANGIOGRAPHY CHEST CT ABDOMEN AND PELVIS WITH CONTRAST TECHNIQUE: Multidetector CT imaging of the chest was performed using the standard protocol during bolus administration of intravenous contrast. Multiplanar CT image reconstructions and MIPs  were obtained to evaluate the vascular anatomy. Multidetector CT imaging of the abdomen and pelvis was performed using the standard protocol during bolus administration of intravenous contrast. CONTRAST:  162m OMNIPAQUE IOHEXOL 350 MG/ML SOLN COMPARISON:  10/27/2019 FINDINGS: CTA CHEST FINDINGS Cardiovascular: Thoracic aorta demonstrates no aneurysmal dilatation or dissection. Mild atherosclerotic calcifications are seen. The pulmonary artery shows a normal branching pattern. No definitive pulmonary embolism is seen. Coronary calcifications are noted. Mediastinum/Nodes: Thoracic inlet demonstrates endotracheal tube in satisfactory position. Gastric catheter extends into the stomach. The esophagus as visualized is within normal limits. Few scattered small mediastinal lymph nodes are seen. Small hilar adenopathy is present as well although incompletely evaluated due to significant consolidation and surrounding soft tissue particularly in the lower lobes. Lungs/Pleura: Large bilateral pleural effusions are noted. Patchy airspace opacity is noted throughout both lungs with increased consolidation in the lower lobes bilaterally. This is consistent with multifocal pneumonia. These changes have progressed significantly in the interval from the prior exam no pneumothorax is noted. Musculoskeletal: Degenerative changes of the thoracic spine are noted. Review of the MIP images confirms the above findings. CT ABDOMEN and PELVIS FINDINGS  Hepatobiliary: No focal liver abnormality is seen. No gallstones, gallbladder wall thickening, or biliary dilatation. Pancreas: Scattered calcifications are noted throughout the pancreas with pancreatic ductal dilatation consistent with chronic pancreatitis. These changes are stable from the prior CT examination. Spleen: Normal in size without focal abnormality. Adrenals/Urinary Tract: Adrenal glands appear within normal limits. Kidneys demonstrate bilateral renal calculi worse on the left than the right but similar to that seen on prior CT examination. Mild fullness of the collecting systems is noted although felt to be related to significant over distention of the bladder as no definitive ureteral stones are seen. Stomach/Bowel: No obstructive or inflammatory changes of the colon are seen. The appendix is within normal limits. No small bowel or gastric abnormality is seen. Vascular/Lymphatic: Aortic atherosclerosis. No enlarged abdominal or pelvic lymph nodes. Reproductive: Prostate is unremarkable. Other: No abdominal wall hernia or abnormality. No abdominopelvic ascites. Musculoskeletal: Degenerative changes of lumbar spine are noted. Review of the MIP images confirms the above findings. IMPRESSION: CTA of the chest: No evidence of pulmonary emboli. Significant increase in bilateral parenchymal opacities with bilateral lower lobe consolidation and large effusions bilaterally. These changes have progressed from the prior exam and are consistent with multifocal pneumonia and reactive effusions. Tubes and lines in satisfactory position. CT of the abdomen and pelvis: Chronic changes of pancreatitis with ductal dilatation. This is stable from the previous exam. Bilateral renal calculi worse on the left also stable from the prior exam. The bladder is over distended. No other focal abnormalities in the abdomen and pelvis are seen. Aortic Atherosclerosis (ICD10-I70.0). Electronically Signed   By: MInez CatalinaM.D.   On:  12/28/2019 09:45   DG Chest Port 1 View  Result Date: 12/30/2019 CLINICAL DATA:  Post LEFT thoracentesis EXAM: PORTABLE CHEST 1 VIEW COMPARISON:  Portable exam 1305 hours compared to 12/17 hours FINDINGS: Decreased LEFT pleural effusion and basilar atelectasis post thoracentesis. No pneumothorax. Lungs otherwise clear. Heart size normal. Atherosclerotic calcification aorta. IMPRESSION: No pneumothorax following LEFT thoracentesis. Electronically Signed   By: MLavonia DanaM.D.   On: 12/30/2019 13:14   DG Chest Port 1 View  Result Date: 12/30/2019 CLINICAL DATA:  54year old male status post thoracentesis on the right. EXAM: PORTABLE CHEST 1 VIEW COMPARISON:  Chest CTA 12/28/2019. Portable chest earlier today at 0857 hours. FINDINGS: Portable AP semi upright view at  1217 hours. No residual pleural effusion is evident on the right. No pneumothorax identified. Persistent left side veiling pleural effusion. Extubated and enteric tube removed. Mediastinal contours are stable and within normal limits. No areas of worsening ventilation. IMPRESSION: 1. No pneumothorax following right thoracentesis and no residual right pleural effusion is evident. 2. Stable left pleural effusion. 3. Extubated and enteric tube removed. Electronically Signed   By: Genevie Ann M.D.   On: 12/30/2019 12:29   DG CHEST PORT 1 VIEW  Result Date: 12/30/2019 CLINICAL DATA:  Hypoxia EXAM: PORTABLE CHEST 1 VIEW COMPARISON:  Dec 29, 2019 FINDINGS: Endotracheal tube tip is 2.3 cm above the carina. Nasogastric tube tip and side port are below the diaphragm. No pneumothorax. There are small pleural effusions bilaterally. There is consolidation in the left lower lobe. Heart is upper normal in size with pulmonary vascularity normal. No adenopathy. There is aortic atherosclerosis. No bone lesions. IMPRESSION: Tube positions as described without pneumothorax. Pleural effusions bilaterally. Left lower lobe consolidation, likely due to atelectasis with  potential superimposed pneumonia. Heart upper normal in size. Aortic Atherosclerosis (ICD10-I70.0). Electronically Signed   By: Lowella Grip III M.D.   On: 12/30/2019 09:08   DG Chest Port 1 View  Result Date: 12/29/2019 CLINICAL DATA:  Endotracheal intubation EXAM: PORTABLE CHEST 1 VIEW COMPARISON:  CT 12/28/2019, radiograph 12/28/2019 FINDINGS: *Endotracheal tube in the mid trachea, 3.5 cm from the carina. *Transesophageal tube tip below the level of imaging, beyond the GE junction. *Telemetry leads overlie the chest. Persistent gradient opacities towards the lung bases likely reflecting residual layering effusion, passive atelectasis and some underlying consolidation though much of the more upper lung base airspace opacities appear improved from prior which could reflect some improving edema. No pneumothorax. Cardiomediastinal contours are stable from prior with a calcified aorta. No acute osseous or soft tissue abnormality. IMPRESSION: 1. Endotracheal tube 3.5 cm from the carina. 2. Transesophageal tube tip below the level of imaging, beyond the GE junction. 3. Persistent gradient opacities towards the lung bases likely reflecting residual layering effusion, passive atelectasis and some underlying airspace disease/consolidation. Slight improvement of the airspace disease compared to prior. May reflect improving edema or infection. Electronically Signed   By: Lovena Le M.D.   On: 12/29/2019 05:17   DG Chest Port 1 View  Result Date: 12/28/2019 CLINICAL DATA:  Shortness of breath, hypoxia EXAM: PORTABLE CHEST 1 VIEW COMPARISON:  11/14/2019 FINDINGS: Multifocal pneumonia in the lingula, right middle lobe, and bilateral lower lobes. Additional mild patchy opacity in the right upper lobe. Possible small bilateral pleural effusions, although equivocal. No pneumothorax. Cardiomegaly.  Thoracic aortic atherosclerosis. IMPRESSION: Multifocal pneumonia. Electronically Signed   By: Julian Hy M.D.    On: 12/28/2019 05:24   DG Abd Portable 1 View  Result Date: 12/28/2019 CLINICAL DATA:  Post OG insertion EXAM: PORTABLE ABDOMEN - 1 VIEW COMPARISON:  CT abdomen/pelvis dated 10/27/2019 FINDINGS: Enteric tube terminates in the proximal gastric body. Nonobstructive bowel gas pattern. Bilateral lower lobe opacities with suspected small left pleural effusion. IMPRESSION: Enteric tube terminates in the proximal gastric body. Electronically Signed   By: Julian Hy M.D.   On: 12/28/2019 06:15   ECHOCARDIOGRAM LIMITED  Result Date: 12/28/2019    ECHOCARDIOGRAM LIMITED REPORT   Patient Name:   Kolbie Ramstad Date of Exam: 12/28/2019 Medical Rec #:  [id number]        Height:       68.0 in Accession #:    [id number]  Weight:       136.7 lb Date of Birth:  1965/09/04        BSA:          1.738 m Patient Age:    25 years         BP:           111/70 mmHg Patient Gender: M                HR:           67 bpm. Exam Location:  Inpatient Procedure: 2D Echo Indications:    acute respiratory failure  History:        Patient has prior history of Echocardiogram examinations, most                 recent 10/22/2019. Risk Factors:Diabetes, Hypertension and                 Current Smoker.  Sonographer:    Jannett Celestine RDCS (AE) Referring Phys: [id number] GRACE E BOWSER  Sonographer Comments: Echo performed with patient supine and on artificial respirator. Image acquisition challenging due to respiratory motion. limited mobility IMPRESSIONS  1. Left ventricular ejection fraction, by estimation, is 35 to 40%. The left ventricle has moderately decreased function. The left ventricle demonstrates global hypokinesis. There is moderate concentric left ventricular hypertrophy. Left ventricular diastolic function could not be evaluated.  2. Right ventricular systolic function is normal. The right ventricular size is normal. There is normal pulmonary artery systolic pressure.  3. The mitral valve is normal in structure. Trivial  mitral valve regurgitation.  4. The aortic valve is tricuspid. Aortic valve regurgitation is not visualized.  5. The inferior vena cava is dilated in size with >50% respiratory variability, suggesting right atrial pressure of 8 mmHg. FINDINGS  Left Ventricle: Left ventricular ejection fraction, by estimation, is 35 to 40%. The left ventricle has moderately decreased function. The left ventricle demonstrates global hypokinesis. The left ventricular internal cavity size was normal in size. There is moderate concentric left ventricular hypertrophy. Right Ventricle: The right ventricular size is normal. No increase in right ventricular wall thickness. Right ventricular systolic function is normal. There is normal pulmonary artery systolic pressure. The tricuspid regurgitant velocity is 2.25 m/s, and  with an assumed right atrial pressure of 8 mmHg, the estimated right ventricular systolic pressure is 32.3 mmHg. Pericardium: Trivial pericardial effusion is present. Mitral Valve: The mitral valve is normal in structure. Trivial mitral valve regurgitation. Tricuspid Valve: The tricuspid valve is grossly normal. Tricuspid valve regurgitation is trivial. Aortic Valve: The aortic valve is tricuspid. . There is mild thickening and mild calcification of the aortic valve. Aortic valve regurgitation is not visualized. There is mild thickening of the aortic valve. There is mild calcification of the aortic valve. Pulmonic Valve: The pulmonic valve was normal in structure. Pulmonic valve regurgitation is trivial. Aorta: The aortic root is normal in size and structure. Venous: The inferior vena cava is dilated in size with greater than 50% respiratory variability, suggesting right atrial pressure of 8 mmHg. Additional Comments: There is a small pleural effusion in the left lateral region.  LEFT VENTRICLE PLAX 2D LVIDd:         5.14 cm LV PW:         1.65 cm LV IVS:        1.33 cm  TRICUSPID VALVE TR Peak grad:   20.2 mmHg TR Vmax:         225.00  cm/s Skeet Latch MD Electronically signed by Skeet Latch MD Signature Date/Time: 12/28/2019/4:23:00 PM    Final     Assessment and Plan:   1. Acute systolic CHF - Echocardiogram 12/28/19 showed LV function of 35 to 40%, global hypokinesis, normal RV function. LVEF was 40-45% by echo 10/22/19. LVEF of 55-60% by echo 05/20/19. LVEF of 45-50% in 11/2018. - No overt volume overloaded by exam.  - Coronary calcification by CT angio of chest so he could have underling CAD but question remains regarding compliance with DAPT with required PCI. Alternatively could do stress test to r/o high ischemic area.  - ? Add BB  With prior hx of cocaine abuse - Continue statin.  - Scr back to normal. Add back lisinopril   2. Pleural effusion  - S/p bilateral thoracentesis with removal of at least 600 mL of clear pleural fluid each side.   Pending pathology.   3. Acute hypoxic respiratory failure - Extubated this morning. Breathing stable  - Multifactorial etiology -COVID negative  4. DM - On SSI 5. Chronic pancreatitis 6. Tobacco smoking  7. HTN - Lisinopril and Norvasc on hold  - BP now elevated   Reviewed with Dr. Martinique. Add Coreg and Losartan. Avoid Amlodipine given low LVEF. Stress test on 5/27.   For questions or updates, please contact [place] Please consult [email or web address] for contact info under     Jarrett Soho, PA  12/30/2019 2:21 PM

## 2019-12-30 NOTE — Progress Notes (Signed)
Inpatient Diabetes Program Recommendations  AACE/ADA: New Consensus Statement on Inpatient Glycemic Control (2015)  Target Ranges:  Prepandial:   less than 140 mg/dL      Peak postprandial:   less than 180 mg/dL (1-2 hours)      Critically ill patients:  140 - 180 mg/dL   Results for Rodney Barber, Rodney Barber (MRN [id number]) as of 12/30/2019 07:05  Ref. Range 12/29/2019 07:47 12/29/2019 08:51 12/29/2019 09:46 12/29/2019 12:27 12/29/2019 16:42 12/29/2019 20:05 12/29/2019 21:45  Glucose-Capillary Latest Ref Range: 70 - 99 mg/dL 95 786 (H) 767 (H) 209 (H)  IV Insulin Drip stopped at 10:52am  8 units LEVEMIR given at 12:30pm 76 64 (L) 113 (H)  8 units LEVEMIR   Results for Rodney Barber, Rodney Barber (MRN [id number]) as of 12/30/2019 07:48  Ref. Range 12/29/2019 23:58 12/30/2019 04:15 12/30/2019 05:53 12/30/2019 07:34  Glucose-Capillary Latest Ref Range: 70 - 99 mg/dL 629 (H) 41 (LL) 476 (H) 89     Admit Resp Failure  History: DM, Homelessness, Alcoholic Pancreatitis, HTN   Home DM Meds: Levemir 15 units BID   Current Orders: Levemir 8 units BID      Novolog 1-2-3 units Q4H      Novolog 2 units Q4H for TF coverage    PCP: [facility]--last seen 12/10/2019     Note patient transitioned to SQ Insulin from the IV Insulin Drip yesterday around 12pm.  Vital AF tube feeds initiated last PM and reached goal rate of 55cc/hr by 5am today.    Note patient had mild Hypoglycemic event last PM (CBG 64 at 8pm) and another more severe Hypoglycemic event this AM (CBG 41 at 4am).  No Novolog has been administered to patient since transitioning to SQ Insulin--Levemir is the likely culprit of the Hypoglycemia.  However, note that tube feeds are now at goal rate as of this AM.  Note that Levemir and Novolog tube feed coverage stopped this AM.    --Will follow patient during hospitalization--  Ambrose Finland RN, MSN, CDE Diabetes Coordinator Inpatient Glycemic Control Team Team Pager: [phone]  (8a-5p)

## 2019-12-31 LAB — RESPIRATORY PANEL BY PCR

## 2019-12-31 LAB — CBC WITH DIFFERENTIAL/PLATELET
Abs Immature Granulocytes: 0.08 10*3/uL — ABNORMAL HIGH (ref 0.00–0.07)
Basophils Absolute: 0.1 10*3/uL (ref 0.0–0.1)
Basophils Relative: 1 %
Eosinophils Absolute: 0.1 10*3/uL (ref 0.0–0.5)
Eosinophils Relative: 1 %
HCT: 32.2 % — ABNORMAL LOW (ref 39.0–52.0)
Hemoglobin: 10.1 g/dL — ABNORMAL LOW (ref 13.0–17.0)
Immature Granulocytes: 1 %
Lymphocytes Relative: 19 %
Lymphs Abs: 2 10*3/uL (ref 0.7–4.0)
MCH: 29 pg (ref 26.0–34.0)
MCHC: 31.4 g/dL (ref 30.0–36.0)
MCV: 92.5 fL (ref 80.0–100.0)
Monocytes Absolute: 0.5 10*3/uL (ref 0.1–1.0)
Monocytes Relative: 5 %
Neutro Abs: 7.6 10*3/uL (ref 1.7–7.7)
Neutrophils Relative %: 73 %
Platelets: 325 10*3/uL (ref 150–400)
RBC: 3.48 MIL/uL — ABNORMAL LOW (ref 4.22–5.81)
RDW: 14.9 % (ref 11.5–15.5)
WBC: 10.4 10*3/uL (ref 4.0–10.5)
nRBC: 0 % (ref 0.0–0.2)

## 2019-12-31 LAB — CULTURE, RESPIRATORY W GRAM STAIN: Culture: NORMAL

## 2019-12-31 LAB — GLUCOSE, CAPILLARY
Glucose-Capillary: 152 mg/dL — ABNORMAL HIGH (ref 70–99)
Glucose-Capillary: 148 mg/dL — ABNORMAL HIGH (ref 70–99)
Glucose-Capillary: 168 mg/dL — ABNORMAL HIGH (ref 70–99)
Glucose-Capillary: 247 mg/dL — ABNORMAL HIGH (ref 70–99)
Glucose-Capillary: 307 mg/dL — ABNORMAL HIGH (ref 70–99)
Glucose-Capillary: 268 mg/dL — ABNORMAL HIGH (ref 70–99)
Glucose-Capillary: 374 mg/dL — ABNORMAL HIGH (ref 70–99)

## 2019-12-31 LAB — BASIC METABOLIC PANEL
Anion gap: 13 (ref 5–15)
BUN: 23 mg/dL — ABNORMAL HIGH (ref 6–20)
CO2: 20 mmol/L — ABNORMAL LOW (ref 22–32)
Calcium: 8.2 mg/dL — ABNORMAL LOW (ref 8.9–10.3)
Chloride: 112 mmol/L — ABNORMAL HIGH (ref 98–111)
Creatinine, Ser: 1.08 mg/dL (ref 0.61–1.24)
GFR calc Af Amer: >60 mL/min (ref >60)
GFR calc non Af Amer: >60 mL/min (ref >60)
Glucose, Bld: 159 mg/dL — ABNORMAL HIGH (ref 70–99)
Potassium: 3.6 mmol/L (ref 3.5–5.1)
Sodium: 145 mmol/L (ref 135–145)

## 2019-12-31 LAB — CYTOLOGY - NON PAP

## 2019-12-31 LAB — PATHOLOGIST SMEAR REVIEW

## 2019-12-31 MED ORDER — INSULIN ASPART 100 UNIT/ML ~~LOC~~ SOLN
0.0000 [IU] | Freq: Three times a day (TID) | SUBCUTANEOUS | Status: DC
  Administered 2019-12-31: 5 [IU] via SUBCUTANEOUS
  Administered 2019-12-31: 7 [IU] via SUBCUTANEOUS
  Administered 2019-12-31: 2 [IU] via SUBCUTANEOUS
  Administered 2020-01-01: 1 [IU] via SUBCUTANEOUS
  Administered 2020-01-01 – 2020-01-02 (×3): 9 [IU] via SUBCUTANEOUS
  Administered 2020-01-03 (×2): 1 [IU] via SUBCUTANEOUS
  Administered 2020-01-03: 5 [IU] via SUBCUTANEOUS
  Administered 2020-01-04: 3 [IU] via SUBCUTANEOUS
  Administered 2020-01-04: 1 [IU] via SUBCUTANEOUS
  Administered 2020-01-05: 5 [IU] via SUBCUTANEOUS
  Administered 2020-01-05: 2 [IU] via SUBCUTANEOUS
  Administered 2020-01-06: 5 [IU] via SUBCUTANEOUS
  Administered 2020-01-06: 1 [IU] via SUBCUTANEOUS

## 2019-12-31 MED ORDER — FUROSEMIDE 20 MG PO TABS
20.0000 mg | ORAL_TABLET | Freq: Every day | ORAL | Status: DC
  Administered 2019-12-31 – 2020-01-02 (×2): 20 mg via ORAL
  Filled 2019-12-31 (×3): qty 1

## 2019-12-31 MED ORDER — CARVEDILOL 6.25 MG PO TABS
6.2500 mg | ORAL_TABLET | Freq: Two times a day (BID) | ORAL | Status: DC
  Administered 2019-12-31: 6.25 mg via ORAL
  Filled 2019-12-31 (×2): qty 1

## 2019-12-31 MED ORDER — PANCRELIPASE (LIP-PROT-AMYL) 12000-38000 UNITS PO CPEP
12000.0000 [IU] | ORAL_CAPSULE | Freq: Three times a day (TID) | ORAL | Status: DC
  Administered 2020-01-01 – 2020-01-06 (×13): 12000 [IU] via ORAL
  Filled 2019-12-31 (×13): qty 1

## 2019-12-31 MED ORDER — ENSURE ENLIVE PO LIQD
237.0000 mL | Freq: Three times a day (TID) | ORAL | Status: DC
  Administered 2019-12-31 – 2020-01-06 (×13): 237 mL via ORAL

## 2019-12-31 NOTE — Progress Notes (Signed)
Inpatient Diabetes Program Recommendations  AACE/ADA: New Consensus Statement on Inpatient Glycemic Control (2015)  Target Ranges:  Prepandial:   less than 140 mg/dL      Peak postprandial:   less than 180 mg/dL (1-2 hours)      Critically ill patients:  140 - 180 mg/dL   Lab Results  Component Value Date   GLUCAP 307 (H) 12/31/2019   HGBA1C 13.9 (H) 11/14/2019    Review of Glycemic Control Results for Rodney Barber, Rodney Barber (MRN [id number]) as of 12/31/2019 14:45  Ref. Range 12/31/2019 01:13 12/31/2019 04:59 12/31/2019 07:31 12/31/2019 10:06 12/31/2019 11:33  Glucose-Capillary Latest Ref Range: 70 - 99 mg/dL 543 (H) 606 (H) 770 (H) 247 (H) 307 (H)   Diabetes history:  DM2  Outpatient Diabetes medications:  Levemir 15 units bid   Current orders for Inpatient glycemic control:  Novolog 0-9 units tid  Inpatient Diabetes Program Recommendations:    Patient is off tube feeds and has a diet order.  May consider adding basal insulin back.  Levemir 8 units BID  Thank you, Dulce Sellar, RN, BSN Diabetes Coordinator Inpatient Diabetes Program [phone] (team pager from 8a-5p)

## 2019-12-31 NOTE — Progress Notes (Signed)
PROGRESS NOTE    Rodney Barber  DPT:[id number] DOB: 28-Nov-1965 DOA: 12/28/2019 PCP: Elsie Stain, MD  Brief Narrative: 54 yo M PMH alcoholic pancreatitis, htn, DM2 poorly controlled, homelessness, presented to [facility] 5/22 with CC SOB, cough. In ED, pt hypoxic, SpO2 70s on RA. Initially, placed on NRB with SpO2 improvement to 90%. Progressive resp distress and hypoxia was intubated in the emergency room. -Diuresed with IV Lasix, extubated on 5/25 -Transferred to [facility] service today 5/26   Assessment & Plan:   Acute hypoxemic respiratory failure -Acute systolic CHF, EF of 34% lower from prior -Extubated 5/25 -Status post bilateral thoracentesis 5/25, fluid is transudative -Appreciate cardiology input -Continue Coreg, ARB, Lasix -Plan for Lexiscan Myoview tomorrow  Chronic acoholic pancreatitis   -Denies active ongoing alcoholism -Thiamine   COPD/heavy tobacco abuse -Smoking 3 packs/day, counseled -Nebs as needed  DM2 with DKA, brittle -CBGs are stable, continue sliding scale insulin  Homelessness  Chronic anemia -Stable  H/o HTN -BP stable, monitor  DVT prophylaxis: Lovenox Code Status: Full code Family Communication: Discussed patient , no family at bedside Disposition Plan:  Status is: Inpatient  Remains inpatient appropriate because:Ongoing diagnostic testing needed not appropriate for outpatient work up   Dispo: The patient is from: Homeless              Anticipated d/c is to: To be determined              Anticipated d/c date is: 2 days              Patient currently is not medically stable to d/c.   Consultants:   Cardiology  PCCM   Procedures:   Antimicrobials:    Subjective: -Breathing better -Intermittent cough, no wheezing  Objective: Vitals:   12/30/19 1818 12/30/19 2035 12/31/19 0105 12/31/19 0502  BP: 138/81 126/77 (!) 144/80 (!) 142/82  Pulse: 83 81 83 88  Resp: _0 Temp: 97.8 F (36.6 C) 98.7 F (37.1 C) (!) 97.3  F (36.3 C) 98.4 F (36.9 C)  TempSrc: Oral Oral Oral Oral  SpO2: 100% 100% 95% 98%  Weight: 57.1 kg  55.9 kg   Height: 5' 7" (1.702 m)       Intake/Output Summary (Last 24 hours) at 12/31/2019 1050 Last data filed at 12/31/2019 0505 Gross per 24 hour  Intake 396.16 ml  Output 3600 ml  Net -3203.84 ml   Filed Weights   12/30/19 0429 12/30/19 1818 12/31/19 0105  Weight: 61.8 kg 57.1 kg 55.9 kg    Examination:  General exam: Appears calm and comfortable, AAOx2, no distress Respiratory system: Decreased breath sounds at both bases Cardiovascular system: S1 & S2 heard, RRR. Gastrointestinal system: Abdomen is nondistended, soft and nontender.Normal bowel sounds heard. Central nervous system: Alert and oriented. No focal neurological deficits. Extremities: No edema Skin: No rashes on exposed skin  Psychiatry: Poor insight and judgment   Data Reviewed:   CBC: Recent Labs  Lab 12/28/19 0512 12/28/19 0609 12/28/19 0752 12/29/19 0041 12/29/19 0929 12/30/19 1103 12/31/19 0559  WBC 9.9  --   --  11.6* 14.4* 16.4* 10.4  NEUTROABS 8.6*  --   --   --   --   --  7.6  HGB 8.5*   < > 7.8* 6.7* 9.7* 11.2* 10.1*  HCT 28.4*   < > 23.0* 20.9* 30.9* 35.4* 32.2*  MCV 96.3  --   --  92.1 93.9 93.2 92.5  PLT 311  --   --  300 299 315 325   < > = values in this interval not displayed.   Basic Metabolic Panel: Recent Labs  Lab 12/28/19 1211 12/28/19 1436 12/28/19 2008 12/29/19 0041 12/29/19 0929 12/29/19 1650 12/30/19 1125 12/30/19 1636 12/31/19 0559  NA 134*   < > 140 141 138  --  142  --  145  K 4.8   < > 4.1 4.1 4.3  --  3.5  --  3.6  CL 105   < > 113* 112* 114*  --  114*  --  112*  CO2 13*   < > 17* 16* 16*  --  17*  --  20*  GLUCOSE 607*   < > 205* 106* 107*  --  130*  --  159*  BUN 28*   < > 29* 29* 30*  --  31*  --  23*  CREATININE 1.39*   < > 1.22 1.22 1.25*  --  1.18  --  1.08  CALCIUM 8.0*   < > 7.6* 7.8* 7.7*  --  7.8*  --  8.2*  MG 1.9  --   --  1.7  --  1.8  1.8 2.3  --   PHOS 5.9*  --   --  4.4  --  4.2 4.6 5.0*  --    < > = values in this interval not displayed.   GFR: Estimated Creatinine Clearance: 61.1 mL/min (by C-G formula based on SCr of 1.08 mg/dL). Liver Function Tests: Recent Labs  Lab 12/28/19 0512 12/30/19 1206 12/30/19 1636  AST 35  --   --   ALT 24  --   --   ALKPHOS 221*  --   --   BILITOT 1.4*  --   --   PROT 6.4* 6.0* 6.2*  ALBUMIN 2.3*  --   --    Recent Labs  Lab 12/28/19 2008 12/29/19 0041  LIPASE 14 15   Recent Labs  Lab 12/28/19 0512  AMMONIA 25   Coagulation Profile: No results for input(s): INR, PROTIME in the last 168 hours. Cardiac Enzymes: No results for input(s): CKTOTAL, CKMB, CKMBINDEX, TROPONINI in the last 168 hours. BNP (last 3 results) No results for input(s): PROBNP in the last 8760 hours. HbA1C: No results for input(s): HGBA1C in the last 72 hours. CBG: Recent Labs  Lab 12/30/19 2036 12/31/19 0113 12/31/19 0459 12/31/19 0731 12/31/19 1006  GLUCAP 123* 152* 148* 168* 247*   Lipid Profile: Recent Labs    12/30/19 1636  CHOL 159   Thyroid Function Tests: No results for input(s): TSH, T4TOTAL, FREET4, T3FREE, THYROIDAB in the last 72 hours. Anemia Panel: Recent Labs    12/29/19 0041  FERRITIN 312   Urine analysis:    Component Value Date/Time   COLORURINE YELLOW 12/28/2019 1005   APPEARANCEUR HAZY (A) 12/28/2019 1005   LABSPEC 1.021 12/28/2019 1005   PHURINE 5.0 12/28/2019 1005   GLUCOSEU >=500 (A) 12/28/2019 1005   HGBUR LARGE (A) 12/28/2019 1005   BILIRUBINUR NEGATIVE 12/28/2019 1005   BILIRUBINUR negative 03/15/2018 1049   BILIRUBINUR negative 12/26/2017 0941   KETONESUR 5 (A) 12/28/2019 1005   PROTEINUR >=300 (A) 12/28/2019 1005   UROBILINOGEN 0.2 03/15/2018 1049   UROBILINOGEN 0.2 12/08/2010 1613   NITRITE POSITIVE (A) 12/28/2019 1005   LEUKOCYTESUR MODERATE (A) 12/28/2019 1005   Sepsis Labs: '@LABRCNTIP'$ (procalcitonin:4,lacticidven:4)  ) Recent  Results (from the past 240 hour(s))  SARS CORONAVIRUS 2 (TAT 6-24 HRS) Nasopharyngeal Nasopharyngeal Swab     Status:  None   Collection Time: 12/28/19  4:20 AM   Specimen: Nasopharyngeal Swab  Result Value Ref Range Status   SARS Coronavirus 2 NEGATIVE NEGATIVE Final    Comment: (NOTE) SARS-CoV-2 target nucleic acids are NOT DETECTED. The SARS-CoV-2 RNA is generally detectable in upper and lower respiratory specimens during the acute phase of infection. Negative results do not preclude SARS-CoV-2 infection, do not rule out co-infections with other pathogens, and should not be used as the sole basis for treatment or other patient management decisions. Negative results must be combined with clinical observations, patient history, and epidemiological information. The expected result is Negative. Fact Sheet for Patients: SugarRoll.be Fact Sheet for Healthcare Providers: https://www.woods-mathews.com/ This test is not yet approved or cleared by the [place] FDA and  has been authorized for detection and/or diagnosis of SARS-CoV-2 by FDA under an Emergency Use Authorization (EUA). This EUA will remain  in effect (meaning this test can be used) for the duration of the COVID-19 declaration under Section 56 4(b)(1) of the Act, 21 U.S.C. section 360bbb-3(b)(1), unless the authorization is terminated or revoked sooner. Performed at [facility], [place] [address] [place], [place] [address]   SARS Coronavirus 2 by RT PCR (hospital order, performed in [facility] hospital lab) Nasopharyngeal Nasopharyngeal Swab     Status: None   Collection Time: 12/28/19  4:22 AM   Specimen: Nasopharyngeal Swab  Result Value Ref Range Status   SARS Coronavirus 2 NEGATIVE NEGATIVE Final    Comment: (NOTE) SARS-CoV-2 target nucleic acids are NOT DETECTED. The SARS-CoV-2 RNA is generally detectable in upper and lower respiratory specimens during the acute phase  of infection. The lowest concentration of SARS-CoV-2 viral copies this assay can detect is 250 copies / mL. A negative result does not preclude SARS-CoV-2 infection and should not be used as the sole basis for treatment or other patient management decisions.  A negative result may occur with improper specimen collection / handling, submission of specimen other than nasopharyngeal swab, presence of viral mutation(s) within the areas targeted by this assay, and inadequate number of viral copies (<250 copies / mL). A negative result must be combined with clinical observations, patient history, and epidemiological information. Fact Sheet for Patients:   StrictlyIdeas.no Fact Sheet for Healthcare Providers: BankingDealers.co.za This test is not yet approved or cleared  by the [place] FDA and has been authorized for detection and/or diagnosis of SARS-CoV-2 by FDA under an Emergency Use Authorization (EUA).  This EUA will remain in effect (meaning this test can be used) for the duration of the COVID-19 declaration under Section 564(b)(1) of the Act, 21 U.S.C. section 360bbb-3(b)(1), unless the authorization is terminated or revoked sooner. Performed at [facility], [place] [address], [place], [place] [address]   Blood Culture (routine x 2)     Status: None (Preliminary result)   Collection Time: 12/28/19  5:28 AM   Specimen: BLOOD LEFT FOREARM  Result Value Ref Range Status   Specimen Description BLOOD LEFT FOREARM  Final   Special Requests   Final    BOTTLES DRAWN AEROBIC AND ANAEROBIC Blood Culture adequate volume   Culture   Final    NO GROWTH 2 DAYS Performed at [facility], [place] [address] [place], [place] [address]    Report Status PENDING  Incomplete  Blood Culture (routine x 2)     Status: None (Preliminary result)   Collection Time: 12/28/19  5:32 AM   Specimen: BLOOD RIGHT FOREARM  Result Value Ref Range Status     Specimen Description BLOOD RIGHT FOREARM  Final   Special Requests   Final    BOTTLES DRAWN AEROBIC AND ANAEROBIC Blood Culture results may not be optimal due to an inadequate volume of blood received in culture bottles   Culture   Final    NO GROWTH 2 DAYS Performed at [facility], [place] [address] [place], [place] [address]    Report Status PENDING  Incomplete  Culture, respiratory (non-expectorated)     Status: None   Collection Time: 12/28/19  6:51 PM   Specimen: Tracheal Aspirate; Respiratory  Result Value Ref Range Status   Specimen Description TRACHEAL ASPIRATE  Final   Special Requests NONE  Final   Gram Stain   Final    ABUNDANT WBC PRESENT, PREDOMINANTLY PMN RARE SQUAMOUS EPITHELIAL CELLS PRESENT FEW GRAM POSITIVE COCCI IN PAIRS RARE GRAM NEGATIVE COCCOBACILLI RARE GRAM POSITIVE RODS    Culture   Final    FEW Consistent with normal respiratory flora. Performed at [facility], [place] [address], [place], [place] [address]    Report Status 12/31/2019 FINAL  Final  MRSA PCR Screening     Status: None   Collection Time: 12/28/19  6:57 PM   Specimen: Nasal Mucosa; Nasopharyngeal  Result Value Ref Range Status   MRSA by PCR NEGATIVE NEGATIVE Final    Comment:        The GeneXpert MRSA Assay (FDA approved for NASAL specimens only), is one component of a comprehensive MRSA colonization surveillance program. It is not intended to diagnose MRSA infection nor to guide or monitor treatment for MRSA infections. Performed at [facility], [place] [address] [place], [place] [address]   Novel Coronavirus, NAA (Hosp order, Send-out to Ref Lab; TAT 18-24 hrs     Status: None   Collection Time: 12/29/19  9:00 AM  Result Value Ref Range Status   SARS-CoV-2, NAA NOT DETECTED NOT DETECTED Final    Comment: (NOTE) This nucleic acid amplification test was developed and its performance characteristics determined by [employer]. Nucleic acid amplification  tests include RT-PCR and TMA. This test has not been FDA cleared or approved. This test has been authorized by FDA under an Emergency Use Authorization (EUA). This test is only authorized for the duration of time the declaration that circumstances exist justifying the authorization of the emergency use of in vitro diagnostic tests for detection of SARS-CoV-2 virus and/or diagnosis of COVID-19 infection under section 564(b)(1) of the Act, 21 U.S.C. 010UVO-5(D) (1), unless the authorization is terminated or revoked sooner. When diagnostic testing is negative, the possibility of a false negative result should be considered in the context of a patient's recent exposures and the presence of clinical signs and symptoms consistent with COVID-19. An individual without symptoms of COVID- 19 and who is not shedding SARS-CoV-2  virus would expect to have a negative (not detected) result in this assay. Performed At: [facility] [address] [place], [place] [id number] Rush Farmer MD QV:[id number]    [place]  Final    Comment: Performed at [facility], [place] [address] [place], [place] [address]  Body fluid culture     Status: None (Preliminary result)   Collection Time: 12/30/19  1:02 PM   Specimen: Thoracentesis; Body Fluid  Result Value Ref Range Status   Specimen Description THORACENTESIS  Final   Special Requests NONE  Final   Gram Stain  Final    MODERATE WBC PRESENT,BOTH PMN AND MONONUCLEAR NO ORGANISMS SEEN    Culture   Final    NO GROWTH < 24 HOURS Performed at [place] [address] [place], [place] [address]    Report Status PENDING  Incomplete         Radiology Studies: DG Chest Port 1 View  Result Date: 12/30/2019 CLINICAL DATA:  Post LEFT thoracentesis EXAM: PORTABLE CHEST 1 VIEW COMPARISON:  Portable exam 1305 hours compared to 12/17 hours FINDINGS: Decreased LEFT pleural effusion and basilar atelectasis post  thoracentesis. No pneumothorax. Lungs otherwise clear. Heart size normal. Atherosclerotic calcification aorta. IMPRESSION: No pneumothorax following LEFT thoracentesis. Electronically Signed   By: Lavonia Dana M.D.   On: 12/30/2019 13:14   DG Chest Port 1 View  Result Date: 12/30/2019 CLINICAL DATA:  54 year old male status post thoracentesis on the right. EXAM: PORTABLE CHEST 1 VIEW COMPARISON:  Chest CTA 12/28/2019. Portable chest earlier today at 0857 hours. FINDINGS: Portable AP semi upright view at 1217 hours. No residual pleural effusion is evident on the right. No pneumothorax identified. Persistent left side veiling pleural effusion. Extubated and enteric tube removed. Mediastinal contours are stable and within normal limits. No areas of worsening ventilation. IMPRESSION: 1. No pneumothorax following right thoracentesis and no residual right pleural effusion is evident. 2. Stable left pleural effusion. 3. Extubated and enteric tube removed. Electronically Signed   By: Genevie Ann M.D.   On: 12/30/2019 12:29   DG CHEST PORT 1 VIEW  Result Date: 12/30/2019 CLINICAL DATA:  Hypoxia EXAM: PORTABLE CHEST 1 VIEW COMPARISON:  Dec 29, 2019 FINDINGS: Endotracheal tube tip is 2.3 cm above the carina. Nasogastric tube tip and side port are below the diaphragm. No pneumothorax. There are small pleural effusions bilaterally. There is consolidation in the left lower lobe. Heart is upper normal in size with pulmonary vascularity normal. No adenopathy. There is aortic atherosclerosis. No bone lesions. IMPRESSION: Tube positions as described without pneumothorax. Pleural effusions bilaterally. Left lower lobe consolidation, likely due to atelectasis with potential superimposed pneumonia. Heart upper normal in size. Aortic Atherosclerosis (ICD10-I70.0). Electronically Signed   By: Lowella Grip III M.D.   On: 12/30/2019 09:08        Scheduled Meds: . atorvastatin  20 mg Oral q1800  . carvedilol  6.25 mg Oral  BID WC  . chlorhexidine gluconate (MEDLINE KIT)  15 mL Mouth Rinse BID  . Chlorhexidine Gluconate Cloth  6 each Topical Daily  . enoxaparin (LOVENOX) injection  40 mg Subcutaneous Q24H  . furosemide  20 mg Oral Daily  . insulin aspart  0-9 Units Subcutaneous TID WC  . lipase/protease/amylase  24,000 Units Oral TID WC  . losartan  25 mg Oral Daily  . mouth rinse  15 mL Mouth Rinse 10 times per day  . pantoprazole (PROTONIX) IV  40 mg Intravenous QHS   Continuous Infusions: . sodium chloride Stopped (12/29/19 0506)  . azithromycin Stopped (12/30/19 1443)  . cefTRIAXone (ROCEPHIN)  IV Stopped (12/30/19 1411)     LOS: 3 days    Time spent: 72mn    PDomenic Polite MD [facility]  12/31/2019, 10:50 AM

## 2019-12-31 NOTE — TOC Initial Note (Signed)
Transition of Care [facility]) - Initial/Assessment Note    Patient Details  Name: Rodney Barber MRN: [id number] Date of Birth: September 14, 1965  Transition of Care [facility]) CM/SW Contact:    Zenon Mayo, RN Phone Number: 12/31/2019, 10:16 AM  Clinical Narrative:                 NCM spoke with patient, he states he stays in [facility], he receives breakfast, lunch and dinner there.  He states that he does not have a phone right now , so can not set up with [facility]. He states he will need a bus pass at discharge.  Plan is for a stress test tomorrow. He is established with the [facility]. He may need ast with medications because he states he does not have any funds with him.   Expected Discharge Plan: Homeless Shelter Barriers to Discharge: Continued Medical Work up   Patient Goals and CMS Choice Patient states their goals for this hospitalization and ongoing recovery are:: get better   Choice offered to / list presented to : NA  Expected Discharge Plan and Services Expected Discharge Plan: Homeless Shelter In-house Referral: Development worker, community Discharge Planning Services: CM Consult Post Acute Care Choice: NA Living arrangements for the past 2 months: Homeless                   DME Agency: NA       HH Arranged: NA          Prior Living Arrangements/Services Living arrangements for the past 2 months: Homeless   Patient language and need for interpreter reviewed:: Yes        Need for Family Participation in Patient Care: No (Comment) Care giver support system in place?: No (comment)   Criminal Activity/Legal Involvement Pertinent to Current Situation/Hospitalization: No - Comment as needed  Activities of Daily Living      Permission Sought/Granted                  Emotional Assessment Appearance:: Appears stated age     Orientation: : Oriented to Self, Oriented to Place, Oriented to  Time, Oriented to Situation Alcohol / Substance Use: Alcohol  Use Psych Involvement: No (comment)  Admission diagnosis:  Hyperglycemia [R73.9] Elevated brain natriuretic peptide (BNP) level [R79.89] Acute respiratory failure with hypoxia (HCC) [J96.01] Elevated d-dimer [R79.89] Patient Active Problem List   Diagnosis Date Noted  . S/P thoracentesis   . Acute on chronic combined systolic and diastolic CHF (congestive heart failure) ([place])   . Essential hypertension   . Acute respiratory failure with hypoxia ([place]) 12/28/2019  . Transaminitis 09/10/2019  . Tobacco abuse 05/14/2019  . Substance use disorder 02/18/2019  . Uncontrolled type 2 diabetes mellitus ([place]) 03/15/2018  . Alcohol-induced chronic pancreatitis ([place]) 07/22/2013  . Severe malnutrition ([place]) 07/11/2013  . Protein-calorie malnutrition, severe ([place]) 07/11/2013   PCP:  Elsie Stain, MD Pharmacy:   [facility] [place] [address] [place] [address] Phone: [phone] Fax: [phone]     Social Determinants of Health (SDOH) Interventions    Readmission Risk Interventions Readmission Risk Prevention Plan 12/31/2019 10/27/2019 06/24/2019  Transportation Screening Complete Complete Complete  Medication Review Press photographer) Complete Complete Complete  PCP or Specialist appointment within 3-5 days of discharge Complete - Complete  HRI or Home Care Consult Complete Complete Complete  SW Recovery Care/Counseling Consult Complete Complete Complete  Palliative Care Screening - Not  Applicable Not Applicable  Skilled Nursing Facility Not Applicable Not Applicable Not Applicable  Some recent data might be hidden

## 2019-12-31 NOTE — Progress Notes (Signed)
Rehab Admissions Coordinator Note:  Per PT recommendation, this patient was screened by Cheri Rous for appropriateness for an Inpatient Acute Rehab Consult. Per PT evaluation today, the pt is Min A for ambulation and transfers. He is awaiting a stress test tomorrow. Will follow pt for one more day to see if he still has CIR level needs. Will contact MD to request OT order to assess ADL needs as well. Noted pt is homeless; this may be a barrier to him qualifying for CIR if Mod I goals are not anticipated.   Will follow.  Cheri Rous 12/31/2019, 1:29 PM  I can be reached at [phone].

## 2019-12-31 NOTE — Progress Notes (Signed)
Progress Note  Patient Name: Rodney Barber Date of Encounter: 12/31/2019  Primary Cardiologist: No primary care provider on file. new- Dr Martinique  Subjective   Feels much better today. Breathing is improved. No chest pain. Enjoying his breakfast.   Inpatient Medications    Scheduled Meds: . atorvastatin  20 mg Oral q1800  . carvedilol  3.125 mg Oral BID WC  . chlorhexidine gluconate (MEDLINE KIT)  15 mL Mouth Rinse BID  . Chlorhexidine Gluconate Cloth  6 each Topical Daily  . enoxaparin (LOVENOX) injection  40 mg Subcutaneous Q24H  . insulin aspart  0-9 Units Subcutaneous TID WC  . lipase/protease/amylase  24,000 Units Oral TID WC  . losartan  25 mg Oral Daily  . mouth rinse  15 mL Mouth Rinse 10 times per day  . pantoprazole (PROTONIX) IV  40 mg Intravenous QHS   Continuous Infusions: . sodium chloride Stopped (12/29/19 0506)  . azithromycin Stopped (12/30/19 1443)  . cefTRIAXone (ROCEPHIN)  IV Stopped (12/30/19 1411)   PRN Meds: sodium chloride, acetaminophen, dextrose, docusate sodium, ondansetron (ZOFRAN) IV, polyethylene glycol   Vital Signs    Vitals:   12/30/19 1818 12/30/19 2035 12/31/19 0105 12/31/19 0502  BP: 138/81 126/77 (!) 144/80 (!) 142/82  Pulse: 83 81 83 88  Resp: 17 17 17 17   Temp: 97.8 F (36.6 C) 98.7 F (37.1 C) (!) 97.3 F (36.3 C) 98.4 F (36.9 C)  TempSrc: Oral Oral Oral Oral  SpO2: 100% 100% 95% 98%  Weight: 57.1 kg  55.9 kg   Height: 5' 7"  (1.702 m)       Intake/Output Summary (Last 24 hours) at 12/31/2019 1025 Last data filed at 12/31/2019 0505 Gross per 24 hour  Intake 396.16 ml  Output 3600 ml  Net -3203.84 ml   Last 3 Weights 12/31/2019 12/30/2019 12/30/2019  Weight (lbs) 123 lb 3.2 oz 125 lb 14.1 oz 136 lb 3.9 oz  Weight (kg) 55.883 kg 57.1 kg 61.8 kg      Telemetry    NSR - Personally Reviewed  ECG    12/28/19- NSR with nonspecific ST abnormality. Cannot rule out old septal MI- Personally Reviewed  Physical Exam    GEN: No acute distress.  Thin and chronically ill appearing.  Neck: No JVD Cardiac: RRR, no murmurs, rubs, or gallops.  Respiratory: Clear to auscultation bilaterally. GI: Soft, nontender, non-distended  MS: No edema; No deformity. Neuro:  Nonfocal  Psych: Normal affect   Labs    High Sensitivity Troponin:   Recent Labs  Lab 12/28/19 0512 12/28/19 0643  TROPONINIHS 54* 64*      Chemistry Recent Labs  Lab 12/28/19 0512 12/28/19 0609 12/29/19 0929 12/30/19 1125 12/30/19 1206 12/30/19 1636 12/31/19 0559  NA 134*   < > 138 142  --   --  145  K 4.2   < > 4.3 3.5  --   --  3.6  CL 105   < > 114* 114*  --   --  112*  CO2 17*   < > 16* 17*  --   --  20*  GLUCOSE 526*   < > 107* 130*  --   --  159*  BUN 20   < > 30* 31*  --   --  23*  CREATININE 1.15   < > 1.25* 1.18  --   --  1.08  CALCIUM 8.0*   < > 7.7* 7.8*  --   --  8.2*  PROT 6.4*  --   --   --  6.0* 6.2*  --   ALBUMIN 2.3*  --   --   --   --   --   --   AST 35  --   --   --   --   --   --   ALT 24  --   --   --   --   --   --   ALKPHOS 221*  --   --   --   --   --   --   BILITOT 1.4*  --   --   --   --   --   --   GFRNONAA >60   < > >60 >60  --   --  >60  GFRAA >60   < > >60 >60  --   --  >60  ANIONGAP 12   < > 8 11  --   --  13   < > = values in this interval not displayed.     Hematology Recent Labs  Lab 12/29/19 0929 12/30/19 1103 12/31/19 0559  WBC 14.4* 16.4* 10.4  RBC 3.29* 3.80* 3.48*  HGB 9.7* 11.2* 10.1*  HCT 30.9* 35.4* 32.2*  MCV 93.9 93.2 92.5  MCH 29.5 29.5 29.0  MCHC 31.4 31.6 31.4  RDW 13.9 15.0 14.9  PLT 299 315 325    BNP Recent Labs  Lab 12/28/19 0512  BNP 544.4*     DDimer  Recent Labs  Lab 12/28/19 0512 12/29/19 0041  DDIMER 3.06* 1.64*     Radiology    DG Chest Port 1 View  Result Date: 12/30/2019 CLINICAL DATA:  Post LEFT thoracentesis EXAM: PORTABLE CHEST 1 VIEW COMPARISON:  Portable exam 1305 hours compared to 12/17 hours FINDINGS: Decreased LEFT pleural  effusion and basilar atelectasis post thoracentesis. No pneumothorax. Lungs otherwise clear. Heart size normal. Atherosclerotic calcification aorta. IMPRESSION: No pneumothorax following LEFT thoracentesis. Electronically Signed   By: Lavonia Dana M.D.   On: 12/30/2019 13:14   DG Chest Port 1 View  Result Date: 12/30/2019 CLINICAL DATA:  54 year old male status post thoracentesis on the right. EXAM: PORTABLE CHEST 1 VIEW COMPARISON:  Chest CTA 12/28/2019. Portable chest earlier today at 0857 hours. FINDINGS: Portable AP semi upright view at 1217 hours. No residual pleural effusion is evident on the right. No pneumothorax identified. Persistent left side veiling pleural effusion. Extubated and enteric tube removed. Mediastinal contours are stable and within normal limits. No areas of worsening ventilation. IMPRESSION: 1. No pneumothorax following right thoracentesis and no residual right pleural effusion is evident. 2. Stable left pleural effusion. 3. Extubated and enteric tube removed. Electronically Signed   By: Genevie Ann M.D.   On: 12/30/2019 12:29   DG CHEST PORT 1 VIEW  Result Date: 12/30/2019 CLINICAL DATA:  Hypoxia EXAM: PORTABLE CHEST 1 VIEW COMPARISON:  Dec 29, 2019 FINDINGS: Endotracheal tube tip is 2.3 cm above the carina. Nasogastric tube tip and side port are below the diaphragm. No pneumothorax. There are small pleural effusions bilaterally. There is consolidation in the left lower lobe. Heart is upper normal in size with pulmonary vascularity normal. No adenopathy. There is aortic atherosclerosis. No bone lesions. IMPRESSION: Tube positions as described without pneumothorax. Pleural effusions bilaterally. Left lower lobe consolidation, likely due to atelectasis with potential superimposed pneumonia. Heart upper normal in size. Aortic Atherosclerosis (ICD10-I70.0). Electronically Signed   By: Lowella Grip III M.D.   On: 12/30/2019 09:08    Cardiac Studies   Echo 12/28/19 1. Left  ventricular ejection fraction, by estimation, is 35 to 40%. The  left ventricle has moderately decreased function. The left ventricle  demonstrates global hypokinesis. There is moderate concentric left  ventricular hypertrophy. Left ventricular  diastolic function could not be evaluated.  2. Right ventricular systolic function is normal. The right ventricular  size is normal. There is normal pulmonary artery systolic pressure.  3. The mitral valve is normal in structure. Trivial mitral valve  regurgitation.  4. The aortic valve is tricuspid. Aortic valve regurgitation is not  visualized.  5. The inferior vena cava is dilated in size with >50% respiratory  variability, suggesting right atrial pressure of 8 mmHg.   Echo 10/22/19 . Left ventricular ejection fraction, by estimation, is 40 to 45%. The  left ventricle has mildly decreased function. The left ventricle  demonstrates global hypokinesis. Left ventricular diastolic parameters are  consistent with Grade I diastolic  dysfunction (impaired relaxation).  2. Right ventricular systolic function is normal. The right ventricular  size is normal. Tricuspid regurgitation signal is inadequate for assessing  PA pressure.  3. The mitral valve is normal in structure. No evidence of mitral valve  regurgitation.  4. The aortic valve is tricuspid. Aortic valve regurgitation is trivial.  No aortic stenosis is present. There is a small filamentous mobile  echodensity on ventricular side of right coronary cusp, unchanged from  prior TTE on 05/15/19  5. The inferior vena cava is normal in size with <50% respiratory  variability, suggesting right atrial pressure of 8 mmHg.    Patient Profile     54 y.o. male with a hx of diabetes mellitus, alcohol induced chronic pancreatitis, hypertension, tobacco abuse and homelessness who is being seen today for the evaluation of CHF at the request of Dr. Carlis Abbott.  Assessment & Plan    1. Acute  systolic CHF - Echocardiogram 12/28/19 showed LV function of 35 to 40%, global hypokinesis, normal RV function. LVEF was 40-45% by echo 10/22/19. LVEF of 55-60% by echo 05/20/19. LVEF of 45-50% in 11/2018. - volume status improved with diuresis. Negative 3700 cc yesterday. Weight down.  - Coronary calcification by CT angio of chest so he could have underling CAD but suspect LV function more related to HTN and uncontrolled DM.  - on Coreg and ARB- titrate as tolerated.  - Continue statin.  - continue lasix 20 mg daily.  - need to address barriers to compliance. Case management consult.   2. Pleural effusion  - S/p bilateral thoracentesis with removal of at least 600 mL of clear pleural fluid each side. appears transudative likely secondary to CHF.   3. Acute hypoxic respiratory failure - secondary to effusions and CHF. Improved.  -COVID negative  4. DM - uncontrolled. On SSI 5. Chronic pancreatitis 6. Tobacco smoking 3 pk/day- counsel on smoking cessation.  7. HTN - adjust medication as noted. 8. Severe coronary calcification. Plan on [facility] tomorrow to assess ischemic risk. NPO post MN tonight.      For questions or updates, please contact [place] Please consult [email or web address] for contact info under        Signed, Tammera Engert [place], MD  12/31/2019, 10:25 AM

## 2019-12-31 NOTE — Evaluation (Signed)
Clinical/Bedside Swallow Evaluation Patient Details  Name: Rodney Barber MRN: [id number] Date of Birth: 1965/11/29  Today's Date: 12/31/2019 Time: SLP Start Time (ACUTE ONLY): 0854 SLP Stop Time (ACUTE ONLY): 0907 SLP Time Calculation (min) (ACUTE ONLY): 13 min  Past Medical History:  Past Medical History:  Diagnosis Date  . Diabetes mellitus   . Hypertension   . Pancreatitis    Past Surgical History:  Past Surgical History:  Procedure Laterality Date  . ERCP  06/27/2011   Procedure: ENDOSCOPIC RETROGRADE CHOLANGIOPANCREATOGRAPHY (ERCP);  Surgeon: Rodney Kuba, MD;  Location: [facility];  Service: Endoscopy;  Laterality: N/A;  . ERCP W/ PLASTIC STENT PLACEMENT  03/2011  . IR US GUIDE BX ASP/DRAIN  05/16/2019  . TEE WITHOUT CARDIOVERSION N/A 05/20/2019   Procedure: TRANSESOPHAGEAL ECHOCARDIOGRAM (TEE);  Surgeon: Rodney Nose, MD;  Location: [facility] ENDOSCOPY;  Service: Cardiovascular;  Laterality: N/A;   HPI:  Pt is 41, has a history of homelessness, alcoholic pancreatitis/chronic pancreatitis, hypertension, diabetes who presented to the ED on 5/22 with progressive dyspnea over the last several days.  No known sick contacts.  He was profoundly hypoxemic, initially responded to 1.00 NRB but progressive dyspnea. ETT 5/22-5/25. CXR: 5/25: No pneumothorax following LEFT thoracentesis.   Assessment / Plan / Recommendation Clinical Impression  Pt was seen for bedside swallow evaluation and he denied a history of dysphagia. Oral mechanism exam was [facility]. Dentition was reduced but adequate for mastication. He exhibited coughing with the initial sip of thin liquids via straw but no additional signs of aspiration were noted with consecutive swallows of thin liquids or with 3oz water screen. No symptoms of oropharyngeal dysphagia were noted with regular texture solids. A regular texture diet with thin liquids is recommended at this time, and SLP will follow to ensure safety of diet recommendation.   SLP Visit Diagnosis: Dysphagia, unspecified (R13.10)    Aspiration Risk  Mild aspiration risk    Diet Recommendation Regular;Thin liquid   Liquid Administration via: Cup;Straw Medication Administration: Whole meds with liquid Supervision: Patient able to self feed Postural Changes: Seated upright at 90 degrees    Other  Recommendations Oral Care Recommendations: Oral care BID;Patient independent with oral care   Follow up Recommendations None      Frequency and Duration min 2x/week  1 week       Prognosis Prognosis for Safe Diet Advancement: Good      Swallow Study   General Date of Onset: 12/30/19 HPI: Pt is 54, has a history of homelessness, alcoholic pancreatitis/chronic pancreatitis, hypertension, diabetes who presented to the ED on 5/22 with progressive dyspnea over the last several days.  No known sick contacts.  He was profoundly hypoxemic, initially responded to 1.00 NRB but progressive dyspnea. ETT 5/22-5/25. CXR: 5/25: No pneumothorax following LEFT thoracentesis. Type of Study: Bedside Swallow Evaluation Previous Swallow Assessment: None Diet Prior to this Study: NPO Temperature Spikes Noted: No Respiratory Status: Room air History of Recent Intubation: Yes Length of Intubations (days): 3 days Date extubated: 12/30/19 Behavior/Cognition: Cooperative;Alert;Pleasant mood Oral Cavity Assessment: Within Functional Limits Oral Care Completed by SLP: No Oral Cavity - Dentition: Missing dentition;Poor condition Vision: Functional for self-feeding Self-Feeding Abilities: Able to feed self Patient Positioning: Upright in bed Baseline Vocal Quality: Normal Volitional Cough: Strong Volitional Swallow: Able to elicit    Oral/Motor/Sensory Function Overall Oral Motor/Sensory Function: Within functional limits   Ice Chips Ice chips: Not tested   Thin Liquid Thin Liquid: Impaired Presentation: Straw Pharyngeal  Phase Impairments: Cough -  Immediate(WIth initial sip  but not subsequent 3oz/consecutive)    Nectar Thick Nectar Thick Liquid: Not tested   Honey Thick Honey Thick Liquid: Not tested   Puree Puree: Within functional limits Presentation: Spoon   Solid     Solid: Within functional limits Presentation: Self Fed     Rodney Barber I. Rodney Clock, MS, CCC-SLP Acute Rehabilitation Services Office number [phone] Pager [phone]  Rodney Barber 12/31/2019,9:10 AM

## 2019-12-31 NOTE — Progress Notes (Addendum)
Pt still on droplet /contact precaution. Pt was extubated and now on 4L O2. Last covid test turns negative. Will address this at AM.  Pt was 98% on room Air.

## 2019-12-31 NOTE — Progress Notes (Signed)
Nutrition Follow-up  DOCUMENTATION CODES:   Severe malnutrition in context of chronic illness  INTERVENTION:   -Ensure Enlive po TID, each supplement provides 350 kcal and 20 grams of protein -MVI with minerals daily  NUTRITION DIAGNOSIS:   Severe Malnutrition related to chronic illness(alcoholic pancreatitis) as evidenced by severe fat depletion, severe muscle depletion.  Ongoing  GOAL:   Patient will meet greater than or equal to 90% of their needs  Progressing   MONITOR:   PO intake, Supplement acceptance, Labs, Weight trends, Skin, I & O's  REASON FOR ASSESSMENT:   Ventilator, Consult Enteral/tube feeding initiation and management  ASSESSMENT:   54 yo male admitted with progressive SOB x 5 days. Intubated on admission. PMH includes alcoholic pancreatitis, HTN, DM-2, homelessness.  5/25- extubated, s/p thoracentesis- 600 ml fluid obtained and sent to pathology 5/26- s/p BSE- advanced to a regular diet  Reviewed I/O's: -3.2 L x 24 hours and -2.4 L since admission  UOP: 3.7 L x 24 hours  Spoke with pt at bedside, who was pleasant and in good spirits today. He reports feeling better and is happy that he is able to eat. Pt reports he consumed almost all of his breakfast, however, had difficulty chewing toast ("it was too tough").   Pt reports fair appetite PTA. He lives at [facility] and is provided 3 meals per day. Pt reports he eats most of what he is served "but they give [place] a lot of sweets". Pt with some missing teeth and sometimes has difficulty chewing. Offered to downgrade diet to a mechanical soft diet, however, pt politely declined. Reviewed menu with pt to help select foods that are softer in texture.   Pt endorses wt loss, however, unsure when this started. Per his report, UBW is around 145#. He estimates he has lost about 15-20#.   Discussed importance of good meal and supplement intake to promote healing. Pt amenable to Ensure supplements.    Medications reviewed and include creon.   Labs reviewed: CBGS: 123-152 (inpatient orders for glycemic control are 1-3 units insulin aspart every 4 hours).   NUTRITION - FOCUSED PHYSICAL EXAM:    Most Recent Value  Orbital Region  Severe depletion  Upper Arm Region  Severe depletion  Thoracic and Lumbar Region  Severe depletion  Buccal Region  Severe depletion  Temple Region  Severe depletion  Clavicle Bone Region  Severe depletion  Clavicle and Acromion Bone Region  Severe depletion  Scapular Bone Region  Severe depletion  Dorsal Hand  Severe depletion  Patellar Region  Severe depletion  Anterior Thigh Region  Severe depletion  Posterior Calf Region  Severe depletion  Edema (RD Assessment)  None  Hair  Reviewed  Eyes  Reviewed  Mouth  Reviewed  Skin  Reviewed  Nails  Reviewed       Diet Order:   Diet Order            Diet NPO time specified  Diet effective midnight        Diet regular Room service appropriate? Yes; Fluid consistency: Thin  Diet effective now              EDUCATION NEEDS:   Education needs have been addressed  Skin:  Skin Assessment: Skin Integrity Issues: Skin Integrity Issues:: Incisions Incisions: closed mid rt back and mid lt back  Last BM:  Unknown  Height:   Ht Readings from Last 1 Encounters:  12/30/19 5\' 7"  (1.702 m)    Weight:  Wt Readings from Last 1 Encounters:  12/31/19 55.9 kg    Ideal Body Weight:  70 kg  BMI:  Body mass index is 19.3 kg/m.  Estimated Nutritional Needs:   Kcal:  2050-2250  Protein:  105-120 grams  Fluid:  > 2 L    Levada Schilling, RD, LDN, CDCES Registered Dietitian II Certified Diabetes Care and Education Specialist Please refer to [facility] for RD and/or RD on-call/weekend/after hours pager

## 2019-12-31 NOTE — Evaluation (Signed)
Physical Therapy Evaluation Patient Details Name: Rodney Barber MRN: [id number] DOB: 20-Jan-1966 Today's Date: 12/31/2019   History of Present Illness  54 yo M PMH alcoholic pancreatitis, htn, DM2 poorly controlled, homelessness, presented to [facility] 5/22 with CC SOB, cough. In ED, pt hypoxic, SpO2 70s on RA. Initially, placed on NRB with SpO2 improvement to 90%. Progressive resp distress and hypoxia was intubated in the emergency room.  Extubated 5/25.   Clinical Impression  Pt admitted with above diagnosis. Pt was able to ambulate in room but needed min assist due to ataxic gait causing instability with movement. Pt also needed cues to stay close to RW with poor overall safety awareness.  Feel pt would benefit from CIR to achieve greater independence prior to return to shelter.  Pt currently with functional limitations due to the deficits listed below (see PT Problem List). Pt will benefit from skilled PT to increase their independence and safety with mobility to allow discharge to the venue listed below.      Follow Up Recommendations CIR;Supervision/Assistance - 24 hour    Equipment Recommendations  Other (comment)(TBA)    Recommendations for Other Services       Precautions / Restrictions Precautions Precautions: Fall Restrictions Weight Bearing Restrictions: No      Mobility  Bed Mobility Overal bed mobility: Needs Assistance Bed Mobility: Supine to Sit     Supine to sit: Supervision        Transfers Overall transfer level: Needs assistance Equipment used: Rolling walker (2 wheeled) Transfers: Sit to/from Stand Sit to Stand: Min guard;Min assist         General transfer comment: Pt needs min to min guard assist as he is generally unsteady upon standing.  Ataxic movements.  Ambulation/Gait Ambulation/Gait assistance: Min assist Gait Distance (Feet): 110 Feet Assistive device: Rolling walker (2 wheeled) Gait Pattern/deviations: Step-through pattern;Decreased  stride length;Ataxic;Staggering right;Drifts right/left;Staggering left;Trunk flexed;Wide base of support   Gait velocity interpretation: <1.31 ft/sec, indicative of household ambulator General Gait Details: Pt with poor gait stability with ataxic gait, unequal step length and need for min assist for stability.  Pt with very poor safety awareness overall. Set pt up at sink for bath as his primofit had leaked. He bathed himself in sitting except for privates and needing min assist to steady to clean them. Needed constant physical assist   Stairs            Wheelchair Mobility    Modified Rankin (Stroke Patients Only)       Balance Overall balance assessment: Needs assistance Sitting-balance support: No upper extremity supported;Feet supported Sitting balance-Leahy Scale: Fair     Standing balance support: Bilateral upper extremity supported;During functional activity Standing balance-Leahy Scale: Poor Standing balance comment: relies on UE support for balance in standing as well as extrnal support.                             Pertinent Vitals/Pain Pain Assessment: No/denies pain    Home Living Family/patient expects to be discharged to:: Shelter/Homeless Living Arrangements: Alone Available Help at Discharge: Family;Available PRN/intermittently Type of Home: House Home Access: Elevator       Home Equipment: Bedside commode;Shower seat;Walker - 4 wheels Additional Comments: homeless/shelter    Prior Function Level of Independence: Independent with assistive device(s)               Hand Dominance   Dominant Hand: Right    Extremity/Trunk Assessment  Upper Extremity Assessment Upper Extremity Assessment: Defer to OT evaluation    Lower Extremity Assessment Lower Extremity Assessment: Generalized weakness    Cervical / Trunk Assessment Cervical / Trunk Assessment: Normal  Communication   Communication: No difficulties  Cognition  Arousal/Alertness: Awake/alert Behavior During Therapy: WFL for tasks assessed/performed Overall Cognitive Status: Within Functional Limits for tasks assessed                                        General Comments General comments (skin integrity, edema, etc.): VSS wtih sats on RA >90%    Exercises     Assessment/Plan    PT Assessment Patient needs continued PT services  PT Problem List Decreased balance;Decreased mobility;Decreased activity tolerance;Decreased knowledge of use of DME;Decreased safety awareness;Decreased knowledge of precautions       PT Treatment Interventions DME instruction;Gait training;Functional mobility training;Therapeutic activities;Therapeutic exercise;Balance training;Patient/family education    PT Goals (Current goals can be found in the Care Plan section)  Acute Rehab PT Goals Patient Stated Goal: to go get therapy and then back to shelter PT Goal Formulation: With patient Time For Goal Achievement: 01/14/20 Potential to Achieve Goals: Good    Frequency Min 3X/week   Barriers to discharge Decreased caregiver support      Co-evaluation               AM-PAC PT "6 Clicks" Mobility  Outcome Measure Help needed turning from your back to your side while in a flat bed without using bedrails?: A Little Help needed moving from lying on your back to sitting on the side of a flat bed without using bedrails?: A Little Help needed moving to and from a bed to a chair (including a wheelchair)?: A Little Help needed standing up from a chair using your arms (e.g., wheelchair or bedside chair)?: A Lot Help needed to walk in hospital room?: A Lot Help needed climbing 3-5 steps with a railing? : A Lot 6 Click Score: 15    End of Session Equipment Utilized During Treatment: Gait belt Activity Tolerance: Patient limited by fatigue Patient left: in chair;with call bell/phone within reach;with chair alarm set Nurse Communication: Mobility  status PT Visit Diagnosis: Unsteadiness on feet (R26.81);Muscle weakness (generalized) (M62.81)    Time: 4696-2952 PT Time Calculation (min) (ACUTE ONLY): 40 min   Charges:   PT Evaluation $PT Eval Moderate Complexity: 1 Mod PT Treatments $Gait Training: 8-22 mins $Self Care/Home Management: 8-22        Jackob Crookston W,PT Acute Rehabilitation Services Pager:  [phone]  Office:  [phone]    Berline Lopes 12/31/2019, 11:59 AM

## 2020-01-01 ENCOUNTER — Inpatient Hospital Stay (HOSPITAL_COMMUNITY): Payer: Self-pay

## 2020-01-01 DIAGNOSIS — R079 Chest pain, unspecified: Secondary | ICD-10-CM

## 2020-01-01 LAB — LIPID PANEL
Cholesterol: 131 mg/dL (ref 0–200)
HDL: 35 mg/dL — ABNORMAL LOW (ref >40)
LDL Cholesterol: 76 mg/dL (ref 0–99)
Total CHOL/HDL Ratio: 3.7 RATIO
Triglycerides: 99 mg/dL (ref <150)
VLDL: 20 mg/dL (ref 0–40)

## 2020-01-01 LAB — CHOLESTEROL, BODY FLUID: Cholesterol, Fluid: 14 mg/dL

## 2020-01-01 LAB — CBC WITH DIFFERENTIAL/PLATELET
Abs Immature Granulocytes: 0.06 10*3/uL (ref 0.00–0.07)
Basophils Absolute: 0 10*3/uL (ref 0.0–0.1)
Basophils Relative: 1 %
Eosinophils Absolute: 0.2 10*3/uL (ref 0.0–0.5)
Eosinophils Relative: 2 %
HCT: 29.9 % — ABNORMAL LOW (ref 39.0–52.0)
Hemoglobin: 9.2 g/dL — ABNORMAL LOW (ref 13.0–17.0)
Immature Granulocytes: 1 %
Lymphocytes Relative: 21 %
Lymphs Abs: 1.8 10*3/uL (ref 0.7–4.0)
MCH: 28.8 pg (ref 26.0–34.0)
MCHC: 30.8 g/dL (ref 30.0–36.0)
MCV: 93.4 fL (ref 80.0–100.0)
Monocytes Absolute: 0.5 10*3/uL (ref 0.1–1.0)
Monocytes Relative: 6 %
Neutro Abs: 6.2 10*3/uL (ref 1.7–7.7)
Neutrophils Relative %: 69 %
Platelets: 324 10*3/uL (ref 150–400)
RBC: 3.2 MIL/uL — ABNORMAL LOW (ref 4.22–5.81)
RDW: 14.6 % (ref 11.5–15.5)
WBC: 8.8 10*3/uL (ref 4.0–10.5)
nRBC: 0 % (ref 0.0–0.2)

## 2020-01-01 LAB — BASIC METABOLIC PANEL
Anion gap: 7 (ref 5–15)
BUN: 17 mg/dL (ref 6–20)
CO2: 22 mmol/L (ref 22–32)
Calcium: 8.2 mg/dL — ABNORMAL LOW (ref 8.9–10.3)
Chloride: 114 mmol/L — ABNORMAL HIGH (ref 98–111)
Creatinine, Ser: 1.18 mg/dL (ref 0.61–1.24)
GFR calc Af Amer: >60 mL/min (ref >60)
GFR calc non Af Amer: >60 mL/min (ref >60)
Glucose, Bld: 515 mg/dL (ref 70–99)
Potassium: 4.1 mmol/L (ref 3.5–5.1)
Sodium: 143 mmol/L (ref 135–145)

## 2020-01-01 LAB — PH, BODY FLUID: pH, Body Fluid: 7.9

## 2020-01-01 LAB — NM MYOCAR MULTI W/SPECT W/WALL MOTION / EF
Estimated workload: 1 METS
Exercise duration (min): 0 min
Exercise duration (sec): 0 s
MPHR: 165 {beats}/min
Peak HR: 90 {beats}/min
Percent HR: 54 %
Rest HR: 73 {beats}/min

## 2020-01-01 LAB — HEMOGLOBIN A1C
Hgb A1c MFr Bld: 11.2 % — ABNORMAL HIGH (ref 4.8–5.6)
Mean Plasma Glucose: 274.74 mg/dL

## 2020-01-01 LAB — GLUCOSE, CAPILLARY
Glucose-Capillary: 475 mg/dL — ABNORMAL HIGH (ref 70–99)
Glucose-Capillary: 366 mg/dL — ABNORMAL HIGH (ref 70–99)
Glucose-Capillary: 143 mg/dL — ABNORMAL HIGH (ref 70–99)
Glucose-Capillary: 355 mg/dL — ABNORMAL HIGH (ref 70–99)
Glucose-Capillary: 380 mg/dL — ABNORMAL HIGH (ref 70–99)

## 2020-01-01 MED ORDER — INSULIN ASPART 100 UNIT/ML ~~LOC~~ SOLN
6.0000 [IU] | Freq: Once | SUBCUTANEOUS | Status: AC
  Administered 2020-01-01: 6 [IU] via SUBCUTANEOUS

## 2020-01-01 MED ORDER — CARVEDILOL 12.5 MG PO TABS
12.5000 mg | ORAL_TABLET | Freq: Two times a day (BID) | ORAL | Status: DC
  Administered 2020-01-01 – 2020-01-03 (×5): 12.5 mg via ORAL
  Filled 2020-01-01 (×5): qty 1

## 2020-01-01 MED ORDER — INSULIN DETEMIR 100 UNIT/ML ~~LOC~~ SOLN
10.0000 [IU] | Freq: Two times a day (BID) | SUBCUTANEOUS | Status: DC
  Administered 2020-01-01 – 2020-01-02 (×3): 10 [IU] via SUBCUTANEOUS
  Filled 2020-01-01 (×6): qty 0.1

## 2020-01-01 MED ORDER — REGADENOSON 0.4 MG/5ML IV SOLN
0.4000 mg | Freq: Once | INTRAVENOUS | Status: AC
  Administered 2020-01-01: 0.4 mg via INTRAVENOUS

## 2020-01-01 MED ORDER — TECHNETIUM TC 99M TETROFOSMIN IV KIT
32.4000 | PACK | Freq: Once | INTRAVENOUS | Status: AC | PRN
  Administered 2020-01-01: 32.4 via INTRAVENOUS

## 2020-01-01 MED ORDER — REGADENOSON 0.4 MG/5ML IV SOLN
INTRAVENOUS | Status: AC
  Filled 2020-01-01: qty 5

## 2020-01-01 MED ORDER — TECHNETIUM TC 99M TETROFOSMIN IV KIT
10.4000 | PACK | Freq: Once | INTRAVENOUS | Status: AC | PRN
  Administered 2020-01-01: 10.4 via INTRAVENOUS

## 2020-01-01 NOTE — Progress Notes (Signed)
Patient received an additional 6 units of insulin for high blood sugar. Patient Headed to receive stress test.

## 2020-01-01 NOTE — Progress Notes (Signed)
RVP results returned and reviewed. No isolation precautions warranted per recommendations.  Continue standard precautions.   Nikki Dom, RN

## 2020-01-01 NOTE — Progress Notes (Signed)
OT Cancellation Note  Patient Details Name: Rodney Barber MRN: [id number] DOB: 1966/02/13   Cancelled Treatment:    Reason Eval/Treat Not Completed: Patient at procedure or test/ unavailable(Pt is OTF for stress test. Will continue to follow as schedule permits)   Dalphine Handing, MSOT, OTR/L Acute Rehabilitation Services [facility] Office Number: [phone] Pager: [phone]  Dalphine Handing 01/01/2020, 9:45 AM

## 2020-01-01 NOTE — Progress Notes (Signed)
Patient I calm and tearful, he states that his hardship started when he lost his sister, brother  then the mother all in which passed away since 11-27-2022. Patient states that his in shelter and in others that have lost family during the pandemic.

## 2020-01-01 NOTE — Evaluation (Signed)
Occupational Therapy Evaluation Patient Details Name: Rodney Barber MRN: [id number] DOB: 04-12-1966 Today's Date: 01/01/2020    History of Present Illness 54 yo M PMH alcoholic pancreatitis, htn, DM2 poorly controlled, homelessness, presented to [facility] 5/22 with CC SOB, cough. In ED, pt hypoxic, SpO2 70s on RA. Initially, placed on NRB with SpO2 improvement to 90%. Progressive resp distress and hypoxia was intubated in the emergency room.  Extubated 5/25.    Clinical Impression   PTA pt living in [place] homeless shelter and using RW for functional mobility. At time of eval, pt able to complete bed mobility at supervision level and sit <> stands with min A and RW. Pt completed beyond household distance of functional mobility (to nurses desk and down hall) with min guard for steadying. No ataxic movements noted this date as per previous PT eval. Pt reports feeling more steady and close to baseline. Some cognitive deficits in safety awareness were noted, requiring cues throughout session. He was able to recall phone number OT stated and dial it on the phone correctly showing intact recall. Pt plans to d[facility]. Recommend pt have [facility] vs outpatient OT to continue independent BADL progression safely. Will follow per POC listed below.     Follow Up Recommendations  Home health OT;Outpatient OT;Other (comment)(pending ability to get to appointments)    Equipment Recommendations  None recommended by OT    Recommendations for Other Services       Precautions / Restrictions Precautions Precautions: Fall Restrictions Weight Bearing Restrictions: No      Mobility Bed Mobility Overal bed mobility: Needs Assistance Bed Mobility: Supine to Sit     Supine to sit: Supervision        Transfers Overall transfer level: Needs assistance Equipment used: Rolling walker (2 wheeled) Transfers: Sit to/from Stand Sit to Stand: Min assist         General transfer comment:  min A to power up and steady    Balance Overall balance assessment: Needs assistance Sitting-balance support: No upper extremity supported;Feet supported Sitting balance-Leahy Scale: Fair     Standing balance support: Bilateral upper extremity supported;During functional activity Standing balance-Leahy Scale: Poor Standing balance comment: relies on external support of RW                           ADL either performed or assessed with clinical judgement   ADL Overall ADL's : Needs assistance/impaired Eating/Feeding: Set up;Sitting   Grooming: Set up;Sitting   Upper Body Bathing: Set up;Sitting   Lower Body Bathing: Minimal assistance;Sit to/from stand;Sitting/lateral leans   Upper Body Dressing : Set up;Sitting   Lower Body Dressing: Minimal assistance;Sit to/from stand;Sitting/lateral leans   Toilet Transfer: Minimal assistance;Regular Toilet;Grab bars;RW   Toileting- Clothing Manipulation and Hygiene: Set up;Sitting/lateral lean;Sit to/from stand   Tub/ Shower Transfer: Minimal assistance;Shower seat;Ambulation;Rolling walker   Functional mobility during ADLs: Min guard;Rolling walker;Cueing for safety       Vision Patient Visual Report: No change from baseline       Perception     Praxis      Pertinent Vitals/Pain Pain Assessment: No/denies pain     Hand Dominance     Extremity/Trunk Assessment Upper Extremity Assessment Upper Extremity Assessment: Generalized weakness   Lower Extremity Assessment Lower Extremity Assessment: Generalized weakness       Communication Communication Communication: No difficulties   Cognition Arousal/Alertness: Awake/alert Behavior During Therapy: WFL for tasks assessed/performed Overall Cognitive  Status: No family/caregiver present to determine baseline cognitive functioning Area of Impairment: Safety/judgement                         Safety/Judgement: Decreased awareness of safety      General Comments: decreased of safety noted throughout session with higher level tasks/questions   General Comments       Exercises     Shoulder Instructions      Home Living Family/patient expects to be discharged to:: Shelter/Homeless Living Arrangements: Alone Available Help at Discharge: Family;Available PRN/intermittently Type of Home: House Home Access: Elevator           Bathroom Shower/Tub: Walk-in shower         Home Equipment: Bedside commode;Shower seat;Walker - 4 wheels   Additional Comments: stays at the [employer] through [employer] [facility]      Prior Functioning/Environment Level of Independence: Independent with assistive device(s)        Comments: had been using RW for mobility        OT Problem List: Decreased strength;Decreased knowledge of use of DME or AE;Decreased activity tolerance;Impaired balance (sitting and/or standing);Decreased safety awareness      OT Treatment/Interventions: Self-care/ADL training;Therapeutic exercise;Patient/family education;Balance training;Energy conservation;Therapeutic activities;DME and/or AE instruction    OT Goals(Current goals can be found in the care plan section) Acute Rehab OT Goals Patient Stated Goal: return to independence OT Goal Formulation: With patient Time For Goal Achievement: 01/15/20 Potential to Achieve Goals: Good  OT Frequency: Min 2X/week   Barriers to D/C:    homeless, stays in shelter       Co-evaluation              AM-PAC OT "6 Clicks" Daily Activity     Outcome Measure Help from another person eating meals?: None Help from another person taking care of personal grooming?: A Little Help from another person toileting, which includes using toliet, bedpan, or urinal?: A Little Help from another person bathing (including washing, rinsing, drying)?: A Little Help from another person to put on and taking off regular upper body clothing?: None Help from another person to  put on and taking off regular lower body clothing?: A Little 6 Click Score: 20   End of Session Equipment Utilized During Treatment: Gait belt;Rolling walker Nurse Communication: Mobility status  Activity Tolerance: Patient tolerated treatment well Patient left: in chair;with call bell/phone within reach;with chair alarm set  OT Visit Diagnosis: Unsteadiness on feet (R26.81);Other abnormalities of gait and mobility (R26.89);Other symptoms and signs involving cognitive function                Time: 5009-3818 OT Time Calculation (min): 20 min Charges:  OT General Charges $OT Visit: 1 Visit OT Evaluation $OT Eval Moderate Complexity: 1 Mod  Dalphine Handing, MSOT, OTR/L Acute Rehabilitation Services [facility] Office Number: [phone] Pager: [phone]  Dalphine Handing 01/01/2020, 4:29 PM

## 2020-01-01 NOTE — Progress Notes (Signed)
Progress Note  Patient Name: Rodney Barber Date of Encounter: 01/01/2020  Primary Cardiologist: No primary care provider on file. new- Dr Martinique  Subjective   Feels much better today. Breathing is improved. No chest pain.   Inpatient Medications    Scheduled Meds:  atorvastatin  20 mg Oral q1800   carvedilol  6.25 mg Oral BID WC   chlorhexidine gluconate (MEDLINE KIT)  15 mL Mouth Rinse BID   Chlorhexidine Gluconate Cloth  6 each Topical Daily   enoxaparin (LOVENOX) injection  40 mg Subcutaneous Q24H   feeding supplement (ENSURE ENLIVE)  237 mL Oral TID BM   furosemide  20 mg Oral Daily   insulin aspart  0-9 Units Subcutaneous TID WC   lipase/protease/amylase  12,000 Units Oral TID WC   losartan  25 mg Oral Daily   pantoprazole (PROTONIX) IV  40 mg Intravenous QHS   Continuous Infusions:  sodium chloride 250 mL (12/31/19 1352)   cefTRIAXone (ROCEPHIN)  IV 1 g (12/31/19 1354)   PRN Meds: sodium chloride, acetaminophen, dextrose, docusate sodium, ondansetron (ZOFRAN) IV, polyethylene glycol   Vital Signs    Vitals:   12/31/19 1126 12/31/19 2116 01/01/20 0000 01/01/20 0347  BP: 137/75 (!) 142/74  (!) 147/76  Pulse: 82 85  86  Resp: 20 20  20   Temp: 97.7 F (36.5 C) 98.2 F (36.8 C)  98.6 F (37 C)  TempSrc: Oral Oral  Oral  SpO2: 100% 90%  90%  Weight:   54.5 kg   Height:        Intake/Output Summary (Last 24 hours) at 01/01/2020 0753 Last data filed at 01/01/2020 0349 Gross per 24 hour  Intake 920 ml  Output 1851 ml  Net -931 ml   Last 3 Weights 01/01/2020 12/31/2019 12/30/2019  Weight (lbs) 120 lb 1.6 oz 123 lb 3.2 oz 125 lb 14.1 oz  Weight (kg) 54.477 kg 55.883 kg 57.1 kg      Telemetry    NSR - Personally Reviewed  ECG    12/28/19- NSR with nonspecific ST abnormality. Cannot rule out old septal MI- Personally Reviewed  Physical Exam   GEN: No acute distress.  Thin and chronically ill appearing.  Neck: No JVD Cardiac: RRR, no  murmurs, rubs, or gallops.  Respiratory: Clear to auscultation bilaterally. GI: Soft, nontender, non-distended  MS: No edema; No deformity. Neuro:  Nonfocal  Psych: Normal affect   Labs    High Sensitivity Troponin:   Recent Labs  Lab 12/28/19 0512 12/28/19 0643  TROPONINIHS 54* 64*      Chemistry Recent Labs  Lab 12/28/19 0512 12/28/19 0609 12/30/19 1125 12/30/19 1206 12/30/19 1636 12/31/19 0559 01/01/20 0550  NA 134*   < > 142  --   --  145 143  K 4.2   < > 3.5  --   --  3.6 4.1  CL 105   < > 114*  --   --  112* 114*  CO2 17*   < > 17*  --   --  20* 22  GLUCOSE 526*   < > 130*  --   --  159* 515*  BUN 20   < > 31*  --   --  23* 17  CREATININE 1.15   < > 1.18  --   --  1.08 1.18  CALCIUM 8.0*   < > 7.8*  --   --  8.2* 8.2*  PROT 6.4*  --   --  6.0* 6.2*  --   --  ALBUMIN 2.3*  --   --   --   --   --   --   AST 35  --   --   --   --   --   --   ALT 24  --   --   --   --   --   --   ALKPHOS 221*  --   --   --   --   --   --   BILITOT 1.4*  --   --   --   --   --   --   GFRNONAA >60   < > >60  --   --  >60 >60  GFRAA >60   < > >60  --   --  >60 >60  ANIONGAP 12   < > 11  --   --  13 7   < > = values in this interval not displayed.     Hematology Recent Labs  Lab 12/30/19 1103 12/31/19 0559 01/01/20 0550  WBC 16.4* 10.4 8.8  RBC 3.80* 3.48* 3.20*  HGB 11.2* 10.1* 9.2*  HCT 35.4* 32.2* 29.9*  MCV 93.2 92.5 93.4  MCH 29.5 29.0 28.8  MCHC 31.6 31.4 30.8  RDW 15.0 14.9 14.6  PLT 315 325 324    BNP Recent Labs  Lab 12/28/19 0512  BNP 544.4*     DDimer  Recent Labs  Lab 12/28/19 0512 12/29/19 0041  DDIMER 3.06* 1.64*     Radiology    DG Chest Port 1 View  Result Date: 12/30/2019 CLINICAL DATA:  Post LEFT thoracentesis EXAM: PORTABLE CHEST 1 VIEW COMPARISON:  Portable exam 1305 hours compared to 12/17 hours FINDINGS: Decreased LEFT pleural effusion and basilar atelectasis post thoracentesis. No pneumothorax. Lungs otherwise clear. Heart size  normal. Atherosclerotic calcification aorta. IMPRESSION: No pneumothorax following LEFT thoracentesis. Electronically Signed   By: Lavonia Dana M.D.   On: 12/30/2019 13:14   DG Chest Port 1 View  Result Date: 12/30/2019 CLINICAL DATA:  54 year old male status post thoracentesis on the right. EXAM: PORTABLE CHEST 1 VIEW COMPARISON:  Chest CTA 12/28/2019. Portable chest earlier today at 0857 hours. FINDINGS: Portable AP semi upright view at 1217 hours. No residual pleural effusion is evident on the right. No pneumothorax identified. Persistent left side veiling pleural effusion. Extubated and enteric tube removed. Mediastinal contours are stable and within normal limits. No areas of worsening ventilation. IMPRESSION: 1. No pneumothorax following right thoracentesis and no residual right pleural effusion is evident. 2. Stable left pleural effusion. 3. Extubated and enteric tube removed. Electronically Signed   By: Genevie Ann M.D.   On: 12/30/2019 12:29   DG CHEST PORT 1 VIEW  Result Date: 12/30/2019 CLINICAL DATA:  Hypoxia EXAM: PORTABLE CHEST 1 VIEW COMPARISON:  Dec 29, 2019 FINDINGS: Endotracheal tube tip is 2.3 cm above the carina. Nasogastric tube tip and side port are below the diaphragm. No pneumothorax. There are small pleural effusions bilaterally. There is consolidation in the left lower lobe. Heart is upper normal in size with pulmonary vascularity normal. No adenopathy. There is aortic atherosclerosis. No bone lesions. IMPRESSION: Tube positions as described without pneumothorax. Pleural effusions bilaterally. Left lower lobe consolidation, likely due to atelectasis with potential superimposed pneumonia. Heart upper normal in size. Aortic Atherosclerosis (ICD10-I70.0). Electronically Signed   By: Lowella Grip III M.D.   On: 12/30/2019 09:08    Cardiac Studies   Echo 12/28/19 1. Left ventricular ejection fraction, by estimation, is  35 to 40%. The  left ventricle has moderately decreased  function. The left ventricle  demonstrates global hypokinesis. There is moderate concentric left  ventricular hypertrophy. Left ventricular  diastolic function could not be evaluated.  2. Right ventricular systolic function is normal. The right ventricular  size is normal. There is normal pulmonary artery systolic pressure.  3. The mitral valve is normal in structure. Trivial mitral valve  regurgitation.  4. The aortic valve is tricuspid. Aortic valve regurgitation is not  visualized.  5. The inferior vena cava is dilated in size with >50% respiratory  variability, suggesting right atrial pressure of 8 mmHg.   Echo 10/22/19 . Left ventricular ejection fraction, by estimation, is 40 to 45%. The  left ventricle has mildly decreased function. The left ventricle  demonstrates global hypokinesis. Left ventricular diastolic parameters are  consistent with Grade I diastolic  dysfunction (impaired relaxation).  2. Right ventricular systolic function is normal. The right ventricular  size is normal. Tricuspid regurgitation signal is inadequate for assessing  PA pressure.  3. The mitral valve is normal in structure. No evidence of mitral valve  regurgitation.  4. The aortic valve is tricuspid. Aortic valve regurgitation is trivial.  No aortic stenosis is present. There is a small filamentous mobile  echodensity on ventricular side of right coronary cusp, unchanged from  prior TTE on 05/15/19  5. The inferior vena cava is normal in size with <50% respiratory  variability, suggesting right atrial pressure of 8 mmHg.    Patient Profile     54 y.o. male with a hx of diabetes mellitus, alcohol induced chronic pancreatitis, hypertension, tobacco abuse and homelessness who is being seen today for the evaluation of CHF at the request of Dr. Carlis Abbott.  Assessment & Plan    1. Acute systolic CHF - Echocardiogram 12/28/19 showed LV function of 35 to 40%, global hypokinesis, normal RV function.  LVEF was 40-45% by echo 10/22/19. LVEF of 55-60% by echo 05/20/19. LVEF of 45-50% in 11/2018. - volume status improved with diuresis. Negative 1850 cc yesterday. Weight down.  - Coronary calcification by CT angio of chest so he could have underling CAD but suspect LV function more related to HTN and uncontrolled DM.  - on Coreg and ARB- will titrate as tolerated.  - Continue statin.  - continue lasix 20 mg daily.  - need to address barriers to compliance. Case management consult.   2. Pleural effusion  - S/p bilateral thoracentesis with removal of at least 600 mL of clear pleural fluid each side. appears transudative likely secondary to CHF.   3. Acute hypoxic respiratory failure - secondary to effusions and CHF. Improved.  -COVID negative  4. DM - uncontrolled. On SSI. Sugar 515 this am- addressed by primary team  5. Chronic pancreatitis  6. Tobacco smoking 3 pk/day- counsel on smoking cessation.   7. HTN - adjust medication as noted.  8. Severe coronary calcification. Plan on [place] today to assess ischemic risk. NPO this am until after study      For questions or updates, please contact [place] Please consult [email or web address] for contact info under        Signed, Ozzie Remmers Martinique, MD  01/01/2020, 7:53 AM

## 2020-01-01 NOTE — Plan of Care (Signed)
  Problem: Health Behavior/Discharge Planning: Goal: Ability to manage health-related needs will improve Outcome: Progressing   Problem: Clinical Measurements: Goal: Ability to maintain clinical measurements within normal limits will improve Outcome: Progressing Goal: Will remain free from infection Outcome: Progressing Goal: Diagnostic test results will improve Outcome: Progressing Goal: Respiratory complications will improve Outcome: Progressing Goal: Cardiovascular complication will be avoided Outcome: Progressing   Problem: Activity: Goal: Risk for activity intolerance will decrease Outcome: Progressing   Problem: Nutrition: Goal: Adequate nutrition will be maintained Outcome: Progressing   Problem: Coping: Goal: Level of anxiety will decrease Outcome: Progressing   Problem: Elimination: Goal: Will not experience complications related to bowel motility Outcome: Progressing Goal: Will not experience complications related to urinary retention Outcome: Progressing   Problem: Pain Managment: Goal: General experience of comfort will improve Outcome: Progressing   Problem: Safety: Goal: Ability to remain free from injury will improve Outcome: Progressing   Problem: Skin Integrity: Goal: Risk for impaired skin integrity will decrease Outcome: Progressing   Problem: Education: Goal: Ability to demonstrate management of disease process will improve Outcome: Progressing Goal: Ability to verbalize understanding of medication therapies will improve Outcome: Progressing Goal: Individualized Educational Video(s) Outcome: Progressing   Problem: Activity: Goal: Capacity to carry out activities will improve Outcome: Progressing   Problem: Cardiac: Goal: Ability to achieve and maintain adequate cardiopulmonary perfusion will improve Outcome: Progressing   Problem: Health Behavior/Discharge Planning: Goal: Ability to manage health-related needs will improve Outcome:  Progressing   Problem: Clinical Measurements: Goal: Ability to maintain clinical measurements within normal limits will improve Outcome: Progressing Goal: Will remain free from infection Outcome: Progressing Goal: Diagnostic test results will improve Outcome: Progressing Goal: Respiratory complications will improve Outcome: Progressing Goal: Cardiovascular complication will be avoided Outcome: Progressing   Problem: Activity: Goal: Risk for activity intolerance will decrease Outcome: Progressing   Problem: Nutrition: Goal: Adequate nutrition will be maintained Outcome: Progressing   Problem: Coping: Goal: Level of anxiety will decrease Outcome: Progressing   Problem: Elimination: Goal: Will not experience complications related to bowel motility Outcome: Progressing Goal: Will not experience complications related to urinary retention Outcome: Progressing   Problem: Pain Managment: Goal: General experience of comfort will improve Outcome: Progressing   Problem: Safety: Goal: Ability to remain free from injury will improve Outcome: Progressing   Problem: Skin Integrity: Goal: Risk for impaired skin integrity will decrease Outcome: Progressing   Problem: Education: Goal: Ability to demonstrate management of disease process will improve Outcome: Progressing Goal: Ability to verbalize understanding of medication therapies will improve Outcome: Progressing Goal: Individualized Educational Video(s) Outcome: Progressing   Problem: Activity: Goal: Capacity to carry out activities will improve Outcome: Progressing   Problem: Cardiac: Goal: Ability to achieve and maintain adequate cardiopulmonary perfusion will improve Outcome: Progressing

## 2020-01-01 NOTE — Significant Event (Signed)
Patient is alert and oriented blood sugar is 515 paged Md. Pt on a  Regular diet and ate approx. 5 full meals over the last 24 hours currently NPO sin for stress test.

## 2020-01-01 NOTE — Progress Notes (Signed)
Pt has BS of 475 this AM, 9 units given as per SSI. Paged PA Katherina Right about this. Asymptomatic and pt is NPO for stress test today. Will monitor

## 2020-01-01 NOTE — Progress Notes (Signed)
Physical Therapy Treatment Patient Details Name: Rodney Barber MRN: [id number] DOB: 1965/11/13 Today's Date: 01/01/2020    History of Present Illness 54 yo M PMH alcoholic pancreatitis, htn, DM2 poorly controlled, homelessness, presented to [facility] 5/22 with CC SOB, cough. In ED, pt hypoxic, SpO2 70s on RA. Initially, placed on NRB with SpO2 improvement to 90%. Progressive resp distress and hypoxia was intubated in the emergency room.  Extubated 5/25.     PT Comments    Pt was seen for navigation of walker on the hallway, and was unsafe mainly in room with confined space with leaving walker behind to sit down.   Talked with pt about safety using walker, he is going to need supervised help and will still recommend CIR vs gait assistance and follow up therapy will be needed.  Follow acutely to work on endurance and strengthening to LE's with focus on safety with AD.  Pt is motivated to work with PT but making unsafe use of walker at times.  Follow Up Recommendations  CIR;Supervision/Assistance - 24 hour     Equipment Recommendations  Other (comment)    Recommendations for Other Services       Precautions / Restrictions Precautions Precautions: Fall Precaution Comments: monitor walker use Restrictions Weight Bearing Restrictions: No    Mobility  Bed Mobility Overal bed mobility: Needs Assistance Bed Mobility: Supine to Sit;Sit to Supine     Supine to sit: Min guard Sit to supine: Min guard   General bed mobility comments: min guard to cue safety and direction  Transfers Overall transfer level: Needs assistance Equipment used: Rolling walker (2 wheeled) Transfers: Sit to/from Stand Sit to Stand: Min guard;Min assist         General transfer comment: min A to power up and steady  Ambulation/Gait Ambulation/Gait assistance: Min guard;Min assist Gait Distance (Feet): 150 Feet Assistive device: Rolling walker (2 wheeled) Gait Pattern/deviations: Step-through  pattern;Decreased stride length;Wide base of support Gait velocity: reduced Gait velocity interpretation: <1.31 ft/sec, indicative of household ambulator General Gait Details: pt had issues with remembering how to set up for a transition from walker to sit on the bed.  Has turned around to back up to the bed several feet, did not follow instructions well from PT to correct the safety   Stairs             Wheelchair Mobility    Modified Rankin (Stroke Patients Only)       Balance Overall balance assessment: Needs assistance Sitting-balance support: Feet supported Sitting balance-Leahy Scale: Fair     Standing balance support: Bilateral upper extremity supported;During functional activity Standing balance-Leahy Scale: Poor Standing balance comment: more safe when using walker correctly but began to leave it behind at end of walk                            Cognition Arousal/Alertness: Awake/alert Behavior During Therapy: [facility] for tasks assessed/performed Overall Cognitive Status: No family/caregiver present to determine baseline cognitive functioning Area of Impairment: Safety/judgement;Following commands                       Following Commands: Follows one step commands with increased time Safety/Judgement: Decreased awareness of safety;Decreased awareness of deficits     General Comments: requires assistance with pt forgetting how to set up for sitting on the bed      Exercises      General Comments General comments (skin integrity,  edema, etc.): Pt was seen for mobility on the hallway but was most unsafe in the room.  He is still recommended for CIR as he is not going to be safe on RW outdoors      Pertinent Vitals/Pain Pain Assessment: No/denies pain    Home Living Family/patient expects to be discharged to:: Shelter/Homeless Living Arrangements: Alone Available Help at Discharge: Family;Available PRN/intermittently Type of Home:  House Home Access: Elevator     Home Equipment: Bedside commode;Shower seat;Walker - 4 wheels Additional Comments: stays at the [employer] through [employer]    Prior Function Level of Independence: Independent with assistive device(s)      Comments: had been using RW for mobility   PT Goals (current goals can now be found in the care plan section) Acute Rehab PT Goals Patient Stated Goal: return to independence PT Goal Formulation: With patient Progress towards PT goals: Progressing toward goals    Frequency    Min 3X/week      PT Plan Current plan remains appropriate    Co-evaluation              AM-PAC PT "6 Clicks" Mobility   Outcome Measure  Help needed turning from your back to your side while in a flat bed without using bedrails?: A Little Help needed moving from lying on your back to sitting on the side of a flat bed without using bedrails?: A Little Help needed moving to and from a bed to a chair (including a wheelchair)?: A Little Help needed standing up from a chair using your arms (e.g., wheelchair or bedside chair)?: A Little Help needed to walk in hospital room?: A Little Help needed climbing 3-5 steps with a railing? : A Lot 6 Click Score: 17    End of Session Equipment Utilized During Treatment: Gait belt Activity Tolerance: Patient limited by fatigue;Treatment limited secondary to medical complications (Comment) Patient left: in bed;with call bell/phone within reach;with bed alarm set Nurse Communication: Mobility status PT Visit Diagnosis: Unsteadiness on feet (R26.81);Muscle weakness (generalized) (M62.81)     Time: 9371-6967 PT Time Calculation (min) (ACUTE ONLY): 30 min  Charges:  $Gait Training: 8-22 mins $Therapeutic Activity: 8-22 mins                  Ramond Dial 01/01/2020, 6:13 PM  Mee Hives, PT MS Acute Rehab Dept. Number: [place] [facility] [place]

## 2020-01-01 NOTE — Progress Notes (Signed)
Inpatient Diabetes Program Recommendations  AACE/ADA: New Consensus Statement on Inpatient Glycemic Control (2015)  Target Ranges:  Prepandial:   less than 140 mg/dL      Peak postprandial:   less than 180 mg/dL (1-2 hours)      Critically ill patients:  140 - 180 mg/dL   Lab Results  Component Value Date   GLUCAP 475 (H) 01/01/2020   HGBA1C 13.9 (H) 11/14/2019    Review of Glycemic Control Results for Rodney Barber, Rodney Barber (MRN [id number]) as of 01/01/2020 07:42  Ref. Range 12/31/2019 10:06 12/31/2019 11:33 12/31/2019 16:49 12/31/2019 21:18 01/01/2020 06:34  Glucose-Capillary Latest Ref Range: 70 - 99 mg/dL 473 (H) 958 (H) 441 (H) 374 (H) 475 (H)   Diabetes history:  DM2  Outpatient Diabetes medications:  Levemir 15 units bid  Current orders for Inpatient glycemic control:  Novolog 0-9 tid  Inpatient Diabetes Program Recommendations:     Currently NPO for stress test today Please consider: Levemir 10 units bid Novolog 0-15 units tid with meals Novolog 0-5 units qhs  Thank you, Dulce Sellar, RN, BSN Diabetes Coordinator Inpatient Diabetes Program [phone] (team pager from 8a-5p)

## 2020-01-01 NOTE — Progress Notes (Signed)
PT Cancellation Note  Patient Details Name: Rodney Barber MRN: [id number] DOB: [birth year]   Cancelled Treatment:    Reason Eval/Treat Not Completed: Patient at procedure or test/unavailable.  At stress test, and will retry when pt can allow.   Ivar Drape 01/01/2020, 10:29 AM   Samul Dada, PT MS Acute Rehab Dept. Number: [facility] [id number] and [facility] [phone]

## 2020-01-01 NOTE — Progress Notes (Signed)
PROGRESS NOTE    Rodney Barber  ZOX:[id number] DOB: 1965/08/25 DOA: 12/28/2019 PCP: Elsie Stain, MD  Brief Narrative: 54 yo M PMH alcoholic pancreatitis, htn, DM2 poorly controlled, homelessness, presented to [facility] 5/22 with CC SOB, cough. In ED, pt hypoxic, SpO2 70s on RA. Initially, placed on NRB with SpO2 improvement to 90%. Progressive resp distress and hypoxia was intubated in the emergency room. -Diuresed with IV Lasix, extubated on 5/25 -Transferred to [facility] service 5/26  Assessment & Plan:   Acute hypoxemic respiratory failure -Acute systolic CHF, EF of 81% lower from prior -Extubated 5/25 -Status post bilateral thoracentesis 5/25, fluid is transudative -Appreciate cardiology input -Diuresing well with IV Lasix, 3.2 L negative today -Continue Coreg, ARB,  -Diuretics changed to p.o. -Plan for Lexiscan Myoview today -Clinically doubt pneumonia, antibiotics was started in the ICU, DC ceftriaxone and monitor -Ambulate, PT eval  Chronic acoholic pancreatitis   -Denies active ongoing alcoholism -Thiamine   COPD/heavy tobacco abuse -Smoking 3 packs/day, counseled -Nebs as needed  DM2  brittle -CBGs started running very high from yesterday evening -Add Levemir and meal coverage insulin -Check hemoglobin A1c  Homelessness  Chronic anemia -Stable  H/o HTN -BP stable, monitor  DVT prophylaxis: Lovenox Code Status: Full code Family Communication: Discussed patient , no family at bedside Disposition Plan:  Status is: Inpatient  Remains inpatient appropriate because:Ongoing diagnostic testing needed not appropriate for outpatient work up ongoing cardiac work-up and treatment   Dispo: The patient is from: Homeless              Anticipated d/c is to: To be determined              Anticipated d/c date is: 2 days              Patient currently is not medically stable to d/c.   Consultants:   Cardiology  PCCM   Procedures:   Antimicrobials:     Subjective: Feels okay, breathing better, no events overnight, not back to baseline yet  Objective: Vitals:   01/01/20 0932 01/01/20 0938 01/01/20 0940 01/01/20 0942  BP: (!) 158/74 (!) 159/73 (!) 155/75 (!) 150/73  Pulse: 73 85 91 90  Resp:      Temp:      TempSrc:      SpO2:      Weight:      Height:        Intake/Output Summary (Last 24 hours) at 01/01/2020 1043 Last data filed at 01/01/2020 0900 Gross per 24 hour  Intake 680 ml  Output 951 ml  Net -271 ml   Filed Weights   12/30/19 1818 12/31/19 0105 01/01/20 0000  Weight: 57.1 kg 55.9 kg 54.5 kg    Examination:  General exam: Thinly built pleasant male sitting up in bed, AAOx3  Respiratory system: Diminished breath sounds at both bases Cardiovascular system: S1-S2, regular rate rhythm Gastrointestinal system: Soft, nontender, nondistended, bowel sounds present. Central nervous system: Alert and oriented, moves all extremities, no localizing signs  extremities: No edema Skin: No rashes on exposed skin Psychiatry: Poor insight and judgment   Data Reviewed:   CBC: Recent Labs  Lab 12/28/19 0512 12/28/19 0609 12/29/19 0041 12/29/19 0929 12/30/19 1103 12/31/19 0559 01/01/20 0550  WBC 9.9   < > 11.6* 14.4* 16.4* 10.4 8.8  NEUTROABS 8.6*  --   --   --   --  7.6 6.2  HGB 8.5*   < > 6.7* 9.7* 11.2* 10.1* 9.2*  HCT 28.4*   < >  20.9* 30.9* 35.4* 32.2* 29.9*  MCV 96.3   < > 92.1 93.9 93.2 92.5 93.4  PLT 311   < > 300 299 315 325 324   < > = values in this interval not displayed.   Basic Metabolic Panel: Recent Labs  Lab 12/28/19 1211 12/28/19 1436 12/29/19 0041 12/29/19 0929 12/29/19 1650 12/30/19 1125 12/30/19 1636 12/31/19 0559 01/01/20 0550  NA 134*   < > 141 138  --  142  --  145 143  K 4.8   < > 4.1 4.3  --  3.5  --  3.6 4.1  CL 105   < > 112* 114*  --  114*  --  112* 114*  CO2 13*   < > 16* 16*  --  17*  --  20* 22  GLUCOSE 607*   < > 106* 107*  --  130*  --  159* 515*  BUN 28*   < > 29*  30*  --  31*  --  23* 17  CREATININE 1.39*   < > 1.22 1.25*  --  1.18  --  1.08 1.18  CALCIUM 8.0*   < > 7.8* 7.7*  --  7.8*  --  8.2* 8.2*  MG 1.9  --  1.7  --  1.8 1.8 2.3  --   --   PHOS 5.9*  --  4.4  --  4.2 4.6 5.0*  --   --    < > = values in this interval not displayed.   GFR: Estimated Creatinine Clearance: 54.5 mL/min (by C-G formula based on SCr of 1.18 mg/dL). Liver Function Tests: Recent Labs  Lab 12/28/19 0512 12/30/19 1206 12/30/19 1636  AST 35  --   --   ALT 24  --   --   ALKPHOS 221*  --   --   BILITOT 1.4*  --   --   PROT 6.4* 6.0* 6.2*  ALBUMIN 2.3*  --   --    Recent Labs  Lab 12/28/19 2008 12/29/19 0041  LIPASE 14 15   Recent Labs  Lab 12/28/19 0512  AMMONIA 25   Coagulation Profile: No results for input(s): INR, PROTIME in the last 168 hours. Cardiac Enzymes: No results for input(s): CKTOTAL, CKMB, CKMBINDEX, TROPONINI in the last 168 hours. BNP (last 3 results) No results for input(s): PROBNP in the last 8760 hours. HbA1C: Recent Labs    01/01/20 0550  HGBA1C 11.2*   CBG: Recent Labs  Lab 12/31/19 1133 12/31/19 1649 12/31/19 2118 01/01/20 0634 01/01/20 0802  GLUCAP 307* 268* 374* 475* 366*   Lipid Profile: Recent Labs    12/30/19 1636 01/01/20 0550  CHOL 159 131  HDL  --  35*  LDLCALC  --  76  TRIG  --  99  CHOLHDL  --  3.7   Thyroid Function Tests: No results for input(s): TSH, T4TOTAL, FREET4, T3FREE, THYROIDAB in the last 72 hours. Anemia Panel: No results for input(s): VITAMINB12, FOLATE, FERRITIN, TIBC, IRON, RETICCTPCT in the last 72 hours. Urine analysis:    Component Value Date/Time   COLORURINE YELLOW 12/28/2019 1005   APPEARANCEUR HAZY (A) 12/28/2019 1005   LABSPEC 1.021 12/28/2019 1005   PHURINE 5.0 12/28/2019 1005   GLUCOSEU >=500 (A) 12/28/2019 1005   HGBUR LARGE (A) 12/28/2019 1005   BILIRUBINUR NEGATIVE 12/28/2019 1005   BILIRUBINUR negative 03/15/2018 1049   BILIRUBINUR negative 12/26/2017 0941    KETONESUR 5 (A) 12/28/2019 1005   PROTEINUR >=300 (A)  12/28/2019 1005   UROBILINOGEN 0.2 03/15/2018 1049   UROBILINOGEN 0.2 12/08/2010 1613   NITRITE POSITIVE (A) 12/28/2019 1005   LEUKOCYTESUR MODERATE (A) 12/28/2019 1005   Sepsis Labs: _0 (procalcitonin:4,lacticidven:4)  ) Recent Results (from the past 240 hour(s))  SARS CORONAVIRUS 2 (TAT 6-24 HRS) Nasopharyngeal Nasopharyngeal Swab     Status: None   Collection Time: 12/28/19  4:20 AM   Specimen: Nasopharyngeal Swab  Result Value Ref Range Status   SARS Coronavirus 2 NEGATIVE NEGATIVE Final    Comment: (NOTE) SARS-CoV-2 target nucleic acids are NOT DETECTED. The SARS-CoV-2 RNA is generally detectable in upper and lower respiratory specimens during the acute phase of infection. Negative results do not preclude SARS-CoV-2 infection, do not rule out co-infections with other pathogens, and should not be used as the sole basis for treatment or other patient management decisions. Negative results must be combined with clinical observations, patient history, and epidemiological information. The expected result is Negative. Fact Sheet for Patients: SugarRoll.be Fact Sheet for Healthcare Providers: https://www.woods-mathews.com/ This test is not yet approved or cleared by the [place] FDA and  has been authorized for detection and/or diagnosis of SARS-CoV-2 by FDA under an Emergency Use Authorization (EUA). This EUA will remain  in effect (meaning this test can be used) for the duration of the COVID-19 declaration under Section 56 4(b)(1) of the Act, 21 U.S.C. section 360bbb-3(b)(1), unless the authorization is terminated or revoked sooner. Performed at [facility], [place] [address] [place], [place] [address]   SARS Coronavirus 2 by RT PCR (hospital order, performed in [facility] hospital lab) Nasopharyngeal Nasopharyngeal Swab     Status: None   Collection Time: 12/28/19   4:22 AM   Specimen: Nasopharyngeal Swab  Result Value Ref Range Status   SARS Coronavirus 2 NEGATIVE NEGATIVE Final    Comment: (NOTE) SARS-CoV-2 target nucleic acids are NOT DETECTED. The SARS-CoV-2 RNA is generally detectable in upper and lower respiratory specimens during the acute phase of infection. The lowest concentration of SARS-CoV-2 viral copies this assay can detect is 250 copies / mL. A negative result does not preclude SARS-CoV-2 infection and should not be used as the sole basis for treatment or other patient management decisions.  A negative result may occur with improper specimen collection / handling, submission of specimen other than nasopharyngeal swab, presence of viral mutation(s) within the areas targeted by this assay, and inadequate number of viral copies (<250 copies / mL). A negative result must be combined with clinical observations, patient history, and epidemiological information. Fact Sheet for Patients:   StrictlyIdeas.no Fact Sheet for Healthcare Providers: BankingDealers.co.za This test is not yet approved or cleared  by the [place] FDA and has been authorized for detection and/or diagnosis of SARS-CoV-2 by FDA under an Emergency Use Authorization (EUA).  This EUA will remain in effect (meaning this test can be used) for the duration of the COVID-19 declaration under Section 564(b)(1) of the Act, 21 U.S.C. section 360bbb-3(b)(1), unless the authorization is terminated or revoked sooner. Performed at [facility], [place] [address] [place], [place] [address]   Blood Culture (routine x 2)     Status: None (Preliminary result)   Collection Time: 12/28/19  5:28 AM   Specimen: BLOOD LEFT FOREARM  Result Value Ref Range Status   Specimen Description BLOOD LEFT FOREARM  Final   Special Requests   Final    BOTTLES DRAWN AEROBIC AND ANAEROBIC Blood Culture adequate volume   Culture  Final    NO  GROWTH 4 DAYS Performed at [facility], [place] [address], [place], [place] [address]    Report Status PENDING  Incomplete  Blood Culture (routine x 2)     Status: None (Preliminary result)   Collection Time: 12/28/19  5:32 AM   Specimen: BLOOD RIGHT FOREARM  Result Value Ref Range Status   Specimen Description BLOOD RIGHT FOREARM  Final   Special Requests   Final    BOTTLES DRAWN AEROBIC AND ANAEROBIC Blood Culture results may not be optimal due to an inadequate volume of blood received in culture bottles   Culture   Final    NO GROWTH 4 DAYS Performed at [facility], [place] [address] [place], [place] [address]    Report Status PENDING  Incomplete  Culture, respiratory (non-expectorated)     Status: None   Collection Time: 12/28/19  6:51 PM   Specimen: Tracheal Aspirate; Respiratory  Result Value Ref Range Status   Specimen Description TRACHEAL ASPIRATE  Final   Special Requests NONE  Final   Gram Stain   Final    ABUNDANT WBC PRESENT, PREDOMINANTLY PMN RARE SQUAMOUS EPITHELIAL CELLS PRESENT FEW GRAM POSITIVE COCCI IN PAIRS RARE GRAM NEGATIVE COCCOBACILLI RARE GRAM POSITIVE RODS    Culture   Final    FEW Consistent with normal respiratory flora. Performed at [facility], [place] [address] [place], [place] [address]    Report Status 12/31/2019 FINAL  Final  MRSA PCR Screening     Status: None   Collection Time: 12/28/19  6:57 PM   Specimen: Nasal Mucosa; Nasopharyngeal  Result Value Ref Range Status   MRSA by PCR NEGATIVE NEGATIVE Final    Comment:        The GeneXpert MRSA Assay (FDA approved for NASAL specimens only), is one component of a comprehensive MRSA colonization surveillance program. It is not intended to diagnose MRSA infection nor to guide or monitor treatment for MRSA infections. Performed at [facility], [place] [address] [place], [place] [address]   Novel Coronavirus, NAA (Hosp order, Send-out to Ref Lab; TAT 18-24 hrs     Status:  None   Collection Time: 12/29/19  9:00 AM  Result Value Ref Range Status   SARS-CoV-2, NAA NOT DETECTED NOT DETECTED Final    Comment: (NOTE) This nucleic acid amplification test was developed and its performance characteristics determined by [employer]. Nucleic acid amplification tests include RT-PCR and TMA. This test has not been FDA cleared or approved. This test has been authorized by FDA under an Emergency Use Authorization (EUA). This test is only authorized for the duration of time the declaration that circumstances exist justifying the authorization of the emergency use of in vitro diagnostic tests for detection of SARS-CoV-2 virus and/or diagnosis of COVID-19 infection under section 564(b)(1) of the Act, 21 U.S.C. 768TLX-7(W) (1), unless the authorization is terminated or revoked sooner. When diagnostic testing is negative, the possibility of a false negative result should be considered in the context of a patient's recent exposures and the presence of clinical signs and symptoms consistent with COVID-19. An individual without symptoms of COVID- 19 and who is not shedding SARS-CoV-2  virus would expect to have a negative (not detected) result in this assay. Performed At: [facility] [address] [place], [place] [id number] Rush Farmer MD BU:[id number]    [place]  Final    Comment: Performed at [facility], [address] [place], [place] [address]  Body fluid culture     Status: None (Preliminary result)   Collection Time: 12/30/19  1:02 PM   Specimen: Thoracentesis; Body Fluid  Result Value Ref Range Status   Specimen Description THORACENTESIS  Final   Special Requests NONE  Final   Gram Stain   Final    MODERATE WBC PRESENT,BOTH PMN AND MONONUCLEAR NO ORGANISMS SEEN    Culture   Final    NO GROWTH 2 DAYS Performed at [facility], [address], [place], [place] [address]    Report Status PENDING   Incomplete  Body fluid culture     Status: None (Preliminary result)   Collection Time: 12/30/19  1:02 PM   Specimen: Thoracentesis; Body Fluid  Result Value Ref Range Status   Specimen Description THORACENTESIS  Final   Special Requests Normal  Final   Gram Stain   Final    NO WBC SEEN NO ORGANISMS SEEN Performed at [facility], [address], [place], [place] [address]    Culture PENDING  Incomplete   Report Status PENDING  Incomplete  Respiratory Panel by PCR     Status: Abnormal   Collection Time: 12/31/19  1:32 PM   Specimen: Nasopharyngeal Swab; Respiratory  Result Value Ref Range Status   Adenovirus NOT DETECTED NOT DETECTED Final   Coronavirus 229E NOT DETECTED NOT DETECTED Final    Comment: (NOTE) The Coronavirus on the Respiratory Panel, DOES NOT test for the novel  Coronavirus (2019 nCoV)    Coronavirus HKU1 NOT DETECTED NOT DETECTED Final   Coronavirus NL63 NOT DETECTED NOT DETECTED Final   Coronavirus OC43 DETECTED (A) NOT DETECTED Final   Metapneumovirus NOT DETECTED NOT DETECTED Final   Rhinovirus / Enterovirus NOT DETECTED NOT DETECTED Final   Influenza A NOT DETECTED NOT DETECTED Final   Influenza B NOT DETECTED NOT DETECTED Final   Parainfluenza Virus 1 NOT DETECTED NOT DETECTED Final   Parainfluenza Virus 2 NOT DETECTED NOT DETECTED Final   Parainfluenza Virus 3 NOT DETECTED NOT DETECTED Final   Parainfluenza Virus 4 NOT DETECTED NOT DETECTED Final   Respiratory Syncytial Virus NOT DETECTED NOT DETECTED Final   Bordetella pertussis NOT DETECTED NOT DETECTED Final   Chlamydophila pneumoniae NOT DETECTED NOT DETECTED Final   Mycoplasma pneumoniae NOT DETECTED NOT DETECTED Final    Comment: Performed at [facility], [place]. [address] [place],  [address]         Radiology Studies: DG Chest Port 1 View  Result Date: 12/30/2019 CLINICAL DATA:  Post LEFT thoracentesis EXAM: PORTABLE CHEST 1 VIEW COMPARISON:  Portable exam 1305 hours  compared to 12/17 hours FINDINGS: Decreased LEFT pleural effusion and basilar atelectasis post thoracentesis. No pneumothorax. Lungs otherwise clear. Heart size normal. Atherosclerotic calcification aorta. IMPRESSION: No pneumothorax following LEFT thoracentesis. Electronically Signed   By: Lavonia Dana M.D.   On: 12/30/2019 13:14   DG Chest Port 1 View  Result Date: 12/30/2019 CLINICAL DATA:  54 year old male status post thoracentesis on the right. EXAM: PORTABLE CHEST 1 VIEW COMPARISON:  Chest CTA 12/28/2019. Portable chest earlier today at 0857 hours. FINDINGS: Portable AP semi upright view at 1217 hours. No residual pleural effusion is evident on the right. No pneumothorax identified. Persistent left side veiling pleural effusion. Extubated and enteric tube removed. Mediastinal contours are stable and within normal limits. No areas of worsening ventilation. IMPRESSION: 1. No pneumothorax following right thoracentesis and no residual right  pleural effusion is evident. 2. Stable left pleural effusion. 3. Extubated and enteric tube removed. Electronically Signed   By: Genevie Ann M.D.   On: 12/30/2019 12:29        Scheduled Meds: . atorvastatin  20 mg Oral q1800  . carvedilol  12.5 mg Oral BID WC  . chlorhexidine gluconate (MEDLINE KIT)  15 mL Mouth Rinse BID  . Chlorhexidine Gluconate Cloth  6 each Topical Daily  . enoxaparin (LOVENOX) injection  40 mg Subcutaneous Q24H  . feeding supplement (ENSURE ENLIVE)  237 mL Oral TID BM  . furosemide  20 mg Oral Daily  . insulin aspart  0-9 Units Subcutaneous TID WC  . insulin detemir  10 Units Subcutaneous BID  . lipase/protease/amylase  12,000 Units Oral TID WC  . losartan  25 mg Oral Daily  . pantoprazole (PROTONIX) IV  40 mg Intravenous QHS  . regadenoson       Continuous Infusions: . sodium chloride 250 mL (12/31/19 1352)  . cefTRIAXone (ROCEPHIN)  IV 1 g (12/31/19 1354)     LOS: 4 days    Time spent: 16mn    PDomenic Polite MD [facility]  01/01/2020, 10:43 AM

## 2020-01-01 NOTE — Progress Notes (Signed)
Speech Language Pathology Treatment: Dysphagia  Patient Details Name: Rodney Barber MRN: [id number] DOB: 10/02/1965 Today's Date: 01/01/2020 Time: 2956-2130 SLP Time Calculation (min) (ACUTE ONLY): 12 min  Assessment / Plan / Recommendation Clinical Impression  Pt was seen during lunch for dysphagia treatment to ensure tolerance of the recommended diet. Pt denied any signs of aspiration with p.o. intake. He consumed a meal of a [place] sandwich, teddy grahams, applesauce, and thin liquids via straw. No symptoms of oropharyngeal dysphagia were noted with any solids or liquids. It is recommended that the regular texture diet be continued and further skilled SLP services are not clinically indicated at this time. Pt and nursing were educated regarding this and both parties verbalized agreement with plan of care.     HPI HPI: Pt is 84, has a history of homelessness, alcoholic pancreatitis/chronic pancreatitis, hypertension, diabetes who presented to the ED on 5/22 with progressive dyspnea over the last several days.  No known sick contacts.  He was profoundly hypoxemic, initially responded to 1.00 NRB but progressive dyspnea. ETT 5/22-5/25. CXR: 5/25: No pneumothorax following LEFT thoracentesis.      SLP Plan  All goals met       Recommendations  Diet recommendations: Regular;Thin liquid Liquids provided via: Cup;Straw Medication Administration: Whole meds with liquid Supervision: Patient able to self feed                Oral Care Recommendations: Oral care BID;Patient independent with oral care Follow up Recommendations: None SLP Visit Diagnosis: Dysphagia, unspecified (R13.10) Plan: All goals met       Laini Urick I. Vear Clock, MS, CCC-SLP Acute Rehabilitation Services Office number [phone] Pager [phone]                Scheryl Marten 01/01/2020, 1:52 PM

## 2020-01-02 ENCOUNTER — Encounter (HOSPITAL_COMMUNITY)
Admission: EM | Disposition: A | Discharge status: Home / Self Care | Payer: Self-pay | Source: Home / Self Care | Attending: Internal Medicine

## 2020-01-02 DIAGNOSIS — R9439 Abnormal result of other cardiovascular function study: Secondary | ICD-10-CM

## 2020-01-02 DIAGNOSIS — I2510 Atherosclerotic heart disease of native coronary artery without angina pectoris: Secondary | ICD-10-CM

## 2020-01-02 DIAGNOSIS — I5023 Acute on chronic systolic (congestive) heart failure: Secondary | ICD-10-CM

## 2020-01-02 HISTORY — PX: LEFT HEART CATH AND CORONARY ANGIOGRAPHY: CATH118249

## 2020-01-02 LAB — CBC
HCT: 30.8 % — ABNORMAL LOW (ref 39.0–52.0)
Hemoglobin: 9.5 g/dL — ABNORMAL LOW (ref 13.0–17.0)
MCH: 28.6 pg (ref 26.0–34.0)
MCHC: 30.8 g/dL (ref 30.0–36.0)
MCV: 92.8 fL (ref 80.0–100.0)
Platelets: 352 10*3/uL (ref 150–400)
RBC: 3.32 MIL/uL — ABNORMAL LOW (ref 4.22–5.81)
RDW: 14.2 % (ref 11.5–15.5)
WBC: 8.5 10*3/uL (ref 4.0–10.5)
nRBC: 0 % (ref 0.0–0.2)

## 2020-01-02 LAB — CBC WITH DIFFERENTIAL/PLATELET
Abs Immature Granulocytes: 0.05 10*3/uL (ref 0.00–0.07)
Basophils Absolute: 0.1 10*3/uL (ref 0.0–0.1)
Basophils Relative: 1 %
Eosinophils Absolute: 0.3 10*3/uL (ref 0.0–0.5)
Eosinophils Relative: 3 %
HCT: 29.6 % — ABNORMAL LOW (ref 39.0–52.0)
Hemoglobin: 9.3 g/dL — ABNORMAL LOW (ref 13.0–17.0)
Immature Granulocytes: 1 %
Lymphocytes Relative: 29 %
Lymphs Abs: 2.6 10*3/uL (ref 0.7–4.0)
MCH: 29.3 pg (ref 26.0–34.0)
MCHC: 31.4 g/dL (ref 30.0–36.0)
MCV: 93.4 fL (ref 80.0–100.0)
Monocytes Absolute: 0.5 10*3/uL (ref 0.1–1.0)
Monocytes Relative: 6 %
Neutro Abs: 5.5 10*3/uL (ref 1.7–7.7)
Neutrophils Relative %: 60 %
Platelets: 353 10*3/uL (ref 150–400)
RBC: 3.17 MIL/uL — ABNORMAL LOW (ref 4.22–5.81)
RDW: 14.2 % (ref 11.5–15.5)
WBC: 9 10*3/uL (ref 4.0–10.5)
nRBC: 0 % (ref 0.0–0.2)

## 2020-01-02 LAB — TYPE AND SCREEN
ABO/RH(D): O POS
Antibody Screen: POSITIVE
Donor AG Type: NEGATIVE
Donor AG Type: NEGATIVE
Unit division: 0
Unit division: 0

## 2020-01-02 LAB — BODY FLUID CULTURE: Culture: NO GROWTH

## 2020-01-02 LAB — BASIC METABOLIC PANEL
Anion gap: 6 (ref 5–15)
BUN: 10 mg/dL (ref 6–20)
CO2: 23 mmol/L (ref 22–32)
Calcium: 8 mg/dL — ABNORMAL LOW (ref 8.9–10.3)
Chloride: 113 mmol/L — ABNORMAL HIGH (ref 98–111)
Creatinine, Ser: 0.81 mg/dL (ref 0.61–1.24)
GFR calc Af Amer: >60 mL/min (ref >60)
GFR calc non Af Amer: >60 mL/min (ref >60)
Glucose, Bld: 147 mg/dL — ABNORMAL HIGH (ref 70–99)
Potassium: 3.3 mmol/L — ABNORMAL LOW (ref 3.5–5.1)
Sodium: 142 mmol/L (ref 135–145)

## 2020-01-02 LAB — BPAM RBC
Blood Product Expiration Date: 202105292359
Blood Product Expiration Date: 202106102359
ISSUE DATE / TIME: 202105240534
Unit Type and Rh: 5100
Unit Type and Rh: 5100

## 2020-01-02 LAB — CULTURE, BLOOD (ROUTINE X 2)
Culture: NO GROWTH
Culture: NO GROWTH
Special Requests: ADEQUATE

## 2020-01-02 LAB — GLUCOSE, CAPILLARY
Glucose-Capillary: 83 mg/dL (ref 70–99)
Glucose-Capillary: 87 mg/dL (ref 70–99)
Glucose-Capillary: 458 mg/dL — ABNORMAL HIGH (ref 70–99)
Glucose-Capillary: 460 mg/dL — ABNORMAL HIGH (ref 70–99)
Glucose-Capillary: 383 mg/dL — ABNORMAL HIGH (ref 70–99)

## 2020-01-02 LAB — CHOLESTEROL, BODY FLUID: Cholesterol, Fluid: 13 mg/dL

## 2020-01-02 SURGERY — LEFT HEART CATH AND CORONARY ANGIOGRAPHY
Anesthesia: LOCAL

## 2020-01-02 MED ORDER — HEPARIN (PORCINE) IN NACL 1000-0.9 UT/500ML-% IV SOLN
INTRAVENOUS | Status: DC | PRN
  Administered 2020-01-02 (×3): 500 mL

## 2020-01-02 MED ORDER — IOHEXOL 350 MG/ML SOLN
INTRAVENOUS | Status: DC | PRN
  Administered 2020-01-02: 50 mL

## 2020-01-02 MED ORDER — SODIUM CHLORIDE 0.9 % IV SOLN: INTRAVENOUS | Status: DC

## 2020-01-02 MED ORDER — HEPARIN SODIUM (PORCINE) 1000 UNIT/ML IJ SOLN
INTRAMUSCULAR | Status: DC | PRN
  Administered 2020-01-02: 3000 [IU] via INTRAVENOUS

## 2020-01-02 MED ORDER — SPIRONOLACTONE 12.5 MG HALF TABLET
12.5000 mg | ORAL_TABLET | Freq: Every day | ORAL | Status: DC
  Administered 2020-01-02 – 2020-01-06 (×5): 12.5 mg via ORAL
  Filled 2020-01-02 (×5): qty 1

## 2020-01-02 MED ORDER — POTASSIUM CHLORIDE CRYS ER 20 MEQ PO TBCR: 40.0000 meq | EXTENDED_RELEASE_TABLET | Freq: Two times a day (BID) | ORAL | Status: DC

## 2020-01-02 MED ORDER — INSULIN ASPART 100 UNIT/ML ~~LOC~~ SOLN
5.0000 [IU] | SUBCUTANEOUS | Status: AC
  Administered 2020-01-02: 5 [IU] via SUBCUTANEOUS

## 2020-01-02 MED ORDER — POTASSIUM CHLORIDE CRYS ER 20 MEQ PO TBCR
40.0000 meq | EXTENDED_RELEASE_TABLET | Freq: Once | ORAL | Status: AC
  Administered 2020-01-02: 40 meq via ORAL
  Filled 2020-01-02: qty 2

## 2020-01-02 MED ORDER — ATORVASTATIN CALCIUM 80 MG PO TABS
80.0000 mg | ORAL_TABLET | Freq: Every day | ORAL | Status: DC
  Administered 2020-01-02 – 2020-01-05 (×4): 80 mg via ORAL
  Filled 2020-01-02 (×4): qty 1

## 2020-01-02 MED ORDER — LOSARTAN POTASSIUM 50 MG PO TABS
50.0000 mg | ORAL_TABLET | Freq: Every day | ORAL | Status: DC
  Administered 2020-01-02 – 2020-01-04 (×3): 50 mg via ORAL
  Filled 2020-01-02 (×3): qty 1

## 2020-01-02 MED ORDER — LIDOCAINE HCL (PF) 1 % IJ SOLN
INTRAMUSCULAR | Status: AC
  Filled 2020-01-02: qty 30

## 2020-01-02 MED ORDER — ASPIRIN 81 MG PO CHEW: 81.0000 mg | CHEWABLE_TABLET | ORAL | Status: AC

## 2020-01-02 MED ORDER — MIDAZOLAM HCL 2 MG/2ML IJ SOLN
INTRAMUSCULAR | Status: DC | PRN
  Administered 2020-01-02: 1 mg via INTRAVENOUS

## 2020-01-02 MED ORDER — SODIUM CHLORIDE 0.9% FLUSH
3.0000 mL | Freq: Two times a day (BID) | INTRAVENOUS | Status: DC
  Administered 2020-01-02 – 2020-01-06 (×8): 3 mL via INTRAVENOUS

## 2020-01-02 MED ORDER — ASPIRIN 81 MG PO CHEW
81.0000 mg | CHEWABLE_TABLET | Freq: Once | ORAL | Status: AC
  Administered 2020-01-02: 81 mg via ORAL
  Filled 2020-01-02: qty 1

## 2020-01-02 MED ORDER — SODIUM CHLORIDE 0.9% FLUSH: 3.0000 mL | INTRAVENOUS | Status: DC | PRN

## 2020-01-02 MED ORDER — VERAPAMIL HCL 2.5 MG/ML IV SOLN
INTRAVENOUS | Status: AC
  Filled 2020-01-02: qty 2

## 2020-01-02 MED ORDER — VERAPAMIL HCL 2.5 MG/ML IV SOLN
INTRAVENOUS | Status: DC | PRN
  Administered 2020-01-02: 10 mL via INTRA_ARTERIAL

## 2020-01-02 MED ORDER — FENTANYL CITRATE (PF) 100 MCG/2ML IJ SOLN
INTRAMUSCULAR | Status: DC | PRN
  Administered 2020-01-02: 25 ug via INTRAVENOUS

## 2020-01-02 MED ORDER — FENTANYL CITRATE (PF) 100 MCG/2ML IJ SOLN
INTRAMUSCULAR | Status: AC
  Filled 2020-01-02: qty 2

## 2020-01-02 MED ORDER — ENOXAPARIN SODIUM 40 MG/0.4ML ~~LOC~~ SOLN
40.0000 mg | SUBCUTANEOUS | Status: DC
  Administered 2020-01-03 – 2020-01-06 (×4): 40 mg via SUBCUTANEOUS
  Filled 2020-01-02 (×4): qty 0.4

## 2020-01-02 MED ORDER — IOHEXOL 350 MG/ML SOLN
INTRAVENOUS | Status: AC
  Filled 2020-01-02: qty 1

## 2020-01-02 MED ORDER — HEPARIN (PORCINE) IN NACL 1000-0.9 UT/500ML-% IV SOLN
INTRAVENOUS | Status: AC
  Filled 2020-01-02: qty 1000

## 2020-01-02 MED ORDER — HEPARIN SODIUM (PORCINE) 1000 UNIT/ML IJ SOLN
INTRAMUSCULAR | Status: AC
  Filled 2020-01-02: qty 1

## 2020-01-02 MED ORDER — SODIUM CHLORIDE 0.9 % IV SOLN: 250.0000 mL | INTRAVENOUS | Status: DC | PRN

## 2020-01-02 MED ORDER — MIDAZOLAM HCL 2 MG/2ML IJ SOLN
INTRAMUSCULAR | Status: AC
  Filled 2020-01-02: qty 2

## 2020-01-02 MED ORDER — LIDOCAINE HCL (PF) 1 % IJ SOLN
INTRAMUSCULAR | Status: DC | PRN
  Administered 2020-01-02: 2 mL

## 2020-01-02 SURGICAL SUPPLY — 10 items
CATH 5FR JL3.5 JR4 ANG PIG MP (CATHETERS) ×1 IMPLANT
DEVICE RAD COMP TR BAND LRG (VASCULAR PRODUCTS) ×1 IMPLANT
GLIDESHEATH SLEND SS 6F .021 (SHEATH) ×1 IMPLANT
GUIDEWIRE INQWIRE 1.5J.035X260 (WIRE) IMPLANT
INQWIRE 1.5J .035X260CM (WIRE) ×2
KIT HEART LEFT (KITS) ×2 IMPLANT
PACK CARDIAC CATHETERIZATION (CUSTOM PROCEDURE TRAY) ×2 IMPLANT
SYR MEDRAD MARK 7 150ML (SYRINGE) ×2 IMPLANT
TRANSDUCER W/STOPCOCK (MISCELLANEOUS) ×2 IMPLANT
TUBING CIL FLEX 10 FLL-RA (TUBING) ×2 IMPLANT

## 2020-01-02 NOTE — Progress Notes (Signed)
Progress Note  Patient Name: Rodney Barber Date of Encounter: 01/02/2020  Primary Cardiologist: Peter Martinique, MD   Subjective   No chest pain, resting flat. Agrees to heart cath today.  Inpatient Medications    Scheduled Meds: . aspirin  81 mg Oral Pre-Cath  . aspirin  81 mg Oral Once  . atorvastatin  20 mg Oral q1800  . carvedilol  12.5 mg Oral BID WC  . chlorhexidine gluconate (MEDLINE KIT)  15 mL Mouth Rinse BID  . Chlorhexidine Gluconate Cloth  6 each Topical Daily  . enoxaparin (LOVENOX) injection  40 mg Subcutaneous Q24H  . feeding supplement (ENSURE ENLIVE)  237 mL Oral TID BM  . furosemide  20 mg Oral Daily  . insulin aspart  0-9 Units Subcutaneous TID WC  . insulin detemir  10 Units Subcutaneous BID  . lipase/protease/amylase  12,000 Units Oral TID WC  . losartan  50 mg Oral Daily  . pantoprazole (PROTONIX) IV  40 mg Intravenous QHS  . potassium chloride  40 mEq Oral Once   Continuous Infusions: . sodium chloride 250 mL (12/31/19 1352)  . sodium chloride     PRN Meds: sodium chloride, acetaminophen, dextrose, docusate sodium, ondansetron (ZOFRAN) IV, polyethylene glycol   Vital Signs    Vitals:   01/01/20 1622 01/01/20 2019 01/02/20 0357 01/02/20 0359  BP: (!) 147/72 138/78 (!) 145/84   Pulse: 75 69 70   Resp: 17 18 17    Temp: 97.7 F (36.5 C) 97.9 F (36.6 C) 98.7 F (37.1 C)   TempSrc: Oral Oral Oral   SpO2: 100% 98% 96%   Weight:    54.4 kg  Height:        Intake/Output Summary (Last 24 hours) at 01/02/2020 0803 Last data filed at 01/02/2020 0405 Gross per 24 hour  Intake 462 ml  Output --  Net 462 ml   Last 3 Weights 01/02/2020 01/01/2020 12/31/2019  Weight (lbs) 119 lb 14.4 oz 120 lb 1.6 oz 123 lb 3.2 oz  Weight (kg) 54.386 kg 54.477 kg 55.883 kg      Telemetry    Sinus rhythm HR in the 70s - Personally Reviewed  ECG    No new tracings - Personally Reviewed  Physical Exam   GEN: No acute distress.   Neck: No JVD Cardiac:  RRR, no murmurs, rubs, or gallops.  Respiratory: Clear to auscultation bilaterally. GI: Soft, nontender, non-distended  MS: No edema; No deformity. Neuro:  Nonfocal  Psych: Normal affect   Labs    High Sensitivity Troponin:   Recent Labs  Lab 12/28/19 0512 12/28/19 0643  TROPONINIHS 54* 64*      Chemistry Recent Labs  Lab 12/28/19 0512 12/28/19 0609 12/30/19 1125 12/30/19 1206 12/30/19 1636 12/31/19 0559 01/01/20 0550 01/02/20 0401  NA 134*   < >   < >  --   --  145 143 142  K 4.2   < >   < >  --   --  3.6 4.1 3.3*  CL 105   < >   < >  --   --  112* 114* 113*  CO2 17*   < >   < >  --   --  20* 22 23  GLUCOSE 526*   < >   < >  --   --  159* 515* 147*  BUN 20   < >   < >  --   --  23* 17 10  CREATININE 1.15   < >   < >  --   --  1.08 1.18 0.81  CALCIUM 8.0*   < >   < >  --   --  8.2* 8.2* 8.0*  PROT 6.4*  --   --  6.0* 6.2*  --   --   --   ALBUMIN 2.3*  --   --   --   --   --   --   --   AST 35  --   --   --   --   --   --   --   ALT 24  --   --   --   --   --   --   --   ALKPHOS 221*  --   --   --   --   --   --   --   BILITOT 1.4*  --   --   --   --   --   --   --   GFRNONAA >60   < >   < >  --   --  >60 >60 >60  GFRAA >60   < >   < >  --   --  >60 >60 >60  ANIONGAP 12   < >   < >  --   --  13 7 6    < > = values in this interval not displayed.     Hematology Recent Labs  Lab 12/31/19 0559 01/01/20 0550 01/02/20 0401  WBC 10.4 8.8 9.0  RBC 3.48* 3.20* 3.17*  HGB 10.1* 9.2* 9.3*  HCT 32.2* 29.9* 29.6*  MCV 92.5 93.4 93.4  MCH 29.0 28.8 29.3  MCHC 31.4 30.8 31.4  RDW 14.9 14.6 14.2  PLT 325 324 353    BNP Recent Labs  Lab 12/28/19 0512  BNP 544.4*     DDimer  Recent Labs  Lab 12/28/19 0512 12/29/19 0041  DDIMER 3.06* 1.64*     Radiology    NM Myocar Multi W/Spect W/Wall Motion / EF  Result Date: 01/01/2020  There was no ST segment deviation noted during stress.  Defect 1: There is a large defect present in the basal inferolateral, mid  anteroseptal, mid inferolateral and apical septal location.  Findings consistent with prior myocardial infarction with peri-infarct ischemia.  This is a high risk study.  The left ventricular ejection fraction is severely decreased (<30%).  Abnormal, high risk stress nuclear study with prior inferolateral and septal infarcts with mild peri-infarct ischemia; gated ejection fraction 26% with global hypokinesis.  Moderate left ventricular enlargement.  Study interpreted as high risk due to reduced LV function.    Cardiac Studies   Nuclear stress test 01/01/20:  There was no ST segment deviation noted during stress.  Defect 1: There is a large defect present in the basal inferolateral, mid anteroseptal, mid inferolateral and apical septal location.  Findings consistent with prior myocardial infarction with peri-infarct ischemia.  This is a high risk study.  The left ventricular ejection fraction is severely decreased (<30%).   Abnormal, high risk stress nuclear study with prior inferolateral and septal infarcts with mild peri-infarct ischemia; gated ejection fraction 26% with global hypokinesis.  Moderate left ventricular enlargement.  Study interpreted as high risk due to reduced LV function.    Echo 12/28/19 1. Left ventricular ejection fraction, by estimation, is 35 to 40%. The  left ventricle has moderately decreased function. The left ventricle  demonstrates global hypokinesis. There is moderate concentric left  ventricular hypertrophy. Left ventricular  diastolic function could not  be evaluated.  2. Right ventricular systolic function is normal. The right ventricular  size is normal. There is normal pulmonary artery systolic pressure.  3. The mitral valve is normal in structure. Trivial mitral valve  regurgitation.  4. The aortic valve is tricuspid. Aortic valve regurgitation is not  visualized.  5. The inferior vena cava is dilated in size with >50% respiratory    variability, suggesting right atrial pressure of 8 mmHg.   Patient Profile     54 y.o. male with a hx of diabetes mellitus, alcohol induced chronic pancreatitis, hypertension, tobacco abuse and homelessnesswho is being seen for CHF.  Assessment & Plan    Acute systolic heart failure - EF further reduced on echo this admission to 35-40%, previously 40-45% - diuresing on 20 mg lasix daily - appears euvolemic, resting flat this morning - weight is 119 lbs from 136 lbs on admission - continue BB and ARB   CTA chest with coronary calcifications Abnormal nuclear stress test - given the above and reduced EF, will obtain definitive angiography today - continue ASA and statin  The patient understands that risks included but are not limited to stroke (1 in 1000), death (1 in 1000), kidney failure [usually temporary] (1 in 500), bleeding (1 in 200), allergic reaction [possibly serious] (1 in 200).    Pleural effusion - s/p bilateral thoracentesis with removal of 600 cc clear pleural fluid from each side --> appears transudative, likely secondary to CHF   Hypertension - BP elevated yesterday - will increase losartan to 50 mg today   Hyperlipidemia 01/01/2020: Cholesterol 131; HDL 35; LDL Cholesterol 76; Triglycerides 99; VLDL 20 - continue statin   Disposition - patient is plugged into community health and wellness already, should he need DAPT        For questions or updates, please contact [place] Please consult [email or web address] for contact info under        Signed, Ledora Bottcher, PA  01/02/2020, 8:03 AM

## 2020-01-02 NOTE — Progress Notes (Signed)
Patient is alert and oriented x4. Paged [place], PA about concerns in regards to patient's TR band. Patient has been noncompliant about resting his right wrist so the patient's TR band has scant amounts of blood buildup throughout the three hours. Charge nurse and primary nurse has cleanse the area three times and placed guaze around the area, reminded the patient to not apply any pressure; the patient stated he understood all three times. Will cleanse and re-educate the patient and the TR band still has remaining 8cc left from 1530.

## 2020-01-02 NOTE — H&P (View-Only) (Signed)
Progress Note  Patient Name: Rodney Barber Date of Encounter: 01/02/2020  Primary Cardiologist: Peter Martinique, MD   Subjective   No chest pain, resting flat. Agrees to heart cath today.  Inpatient Medications    Scheduled Meds: . aspirin  81 mg Oral Pre-Cath  . aspirin  81 mg Oral Once  . atorvastatin  20 mg Oral q1800  . carvedilol  12.5 mg Oral BID WC  . chlorhexidine gluconate (MEDLINE KIT)  15 mL Mouth Rinse BID  . Chlorhexidine Gluconate Cloth  6 each Topical Daily  . enoxaparin (LOVENOX) injection  40 mg Subcutaneous Q24H  . feeding supplement (ENSURE ENLIVE)  237 mL Oral TID BM  . furosemide  20 mg Oral Daily  . insulin aspart  0-9 Units Subcutaneous TID WC  . insulin detemir  10 Units Subcutaneous BID  . lipase/protease/amylase  12,000 Units Oral TID WC  . losartan  50 mg Oral Daily  . pantoprazole (PROTONIX) IV  40 mg Intravenous QHS  . potassium chloride  40 mEq Oral Once   Continuous Infusions: . sodium chloride 250 mL (12/31/19 1352)  . sodium chloride     PRN Meds: sodium chloride, acetaminophen, dextrose, docusate sodium, ondansetron (ZOFRAN) IV, polyethylene glycol   Vital Signs    Vitals:   01/01/20 1622 01/01/20 2019 01/02/20 0357 01/02/20 0359  BP: (!) 147/72 138/78 (!) 145/84   Pulse: 75 69 70   Resp: 17 18 17    Temp: 97.7 F (36.5 C) 97.9 F (36.6 C) 98.7 F (37.1 C)   TempSrc: Oral Oral Oral   SpO2: 100% 98% 96%   Weight:    54.4 kg  Height:        Intake/Output Summary (Last 24 hours) at 01/02/2020 0803 Last data filed at 01/02/2020 0405 Gross per 24 hour  Intake 462 ml  Output --  Net 462 ml   Last 3 Weights 01/02/2020 01/01/2020 12/31/2019  Weight (lbs) 119 lb 14.4 oz 120 lb 1.6 oz 123 lb 3.2 oz  Weight (kg) 54.386 kg 54.477 kg 55.883 kg      Telemetry    Sinus rhythm HR in the 70s - Personally Reviewed  ECG    No new tracings - Personally Reviewed  Physical Exam   GEN: No acute distress.   Neck: No JVD Cardiac:  RRR, no murmurs, rubs, or gallops.  Respiratory: Clear to auscultation bilaterally. GI: Soft, nontender, non-distended  MS: No edema; No deformity. Neuro:  Nonfocal  Psych: Normal affect   Labs    High Sensitivity Troponin:   Recent Labs  Lab 12/28/19 0512 12/28/19 0643  TROPONINIHS 54* 64*      Chemistry Recent Labs  Lab 12/28/19 0512 12/28/19 0609 12/30/19 1125 12/30/19 1206 12/30/19 1636 12/31/19 0559 01/01/20 0550 01/02/20 0401  NA 134*   < >   < >  --   --  145 143 142  K 4.2   < >   < >  --   --  3.6 4.1 3.3*  CL 105   < >   < >  --   --  112* 114* 113*  CO2 17*   < >   < >  --   --  20* 22 23  GLUCOSE 526*   < >   < >  --   --  159* 515* 147*  BUN 20   < >   < >  --   --  23* 17 10  CREATININE 1.15   < >   < >  --   --  1.08 1.18 0.81  CALCIUM 8.0*   < >   < >  --   --  8.2* 8.2* 8.0*  PROT 6.4*  --   --  6.0* 6.2*  --   --   --   ALBUMIN 2.3*  --   --   --   --   --   --   --   AST 35  --   --   --   --   --   --   --   ALT 24  --   --   --   --   --   --   --   ALKPHOS 221*  --   --   --   --   --   --   --   BILITOT 1.4*  --   --   --   --   --   --   --   GFRNONAA >60   < >   < >  --   --  >60 >60 >60  GFRAA >60   < >   < >  --   --  >60 >60 >60  ANIONGAP 12   < >   < >  --   --  13 7 6    < > = values in this interval not displayed.     Hematology Recent Labs  Lab 12/31/19 0559 01/01/20 0550 01/02/20 0401  WBC 10.4 8.8 9.0  RBC 3.48* 3.20* 3.17*  HGB 10.1* 9.2* 9.3*  HCT 32.2* 29.9* 29.6*  MCV 92.5 93.4 93.4  MCH 29.0 28.8 29.3  MCHC 31.4 30.8 31.4  RDW 14.9 14.6 14.2  PLT 325 324 353    BNP Recent Labs  Lab 12/28/19 0512  BNP 544.4*     DDimer  Recent Labs  Lab 12/28/19 0512 12/29/19 0041  DDIMER 3.06* 1.64*     Radiology    NM Myocar Multi W/Spect W/Wall Motion / EF  Result Date: 01/01/2020  There was no ST segment deviation noted during stress.  Defect 1: There is a large defect present in the basal inferolateral, mid  anteroseptal, mid inferolateral and apical septal location.  Findings consistent with prior myocardial infarction with peri-infarct ischemia.  This is a high risk study.  The left ventricular ejection fraction is severely decreased (<30%).  Abnormal, high risk stress nuclear study with prior inferolateral and septal infarcts with mild peri-infarct ischemia; gated ejection fraction 26% with global hypokinesis.  Moderate left ventricular enlargement.  Study interpreted as high risk due to reduced LV function.    Cardiac Studies   Nuclear stress test 01/01/20:  There was no ST segment deviation noted during stress.  Defect 1: There is a large defect present in the basal inferolateral, mid anteroseptal, mid inferolateral and apical septal location.  Findings consistent with prior myocardial infarction with peri-infarct ischemia.  This is a high risk study.  The left ventricular ejection fraction is severely decreased (<30%).   Abnormal, high risk stress nuclear study with prior inferolateral and septal infarcts with mild peri-infarct ischemia; gated ejection fraction 26% with global hypokinesis.  Moderate left ventricular enlargement.  Study interpreted as high risk due to reduced LV function.    Echo 12/28/19 1. Left ventricular ejection fraction, by estimation, is 35 to 40%. The  left ventricle has moderately decreased function. The left ventricle  demonstrates global hypokinesis. There is moderate concentric left  ventricular hypertrophy. Left ventricular  diastolic function could not  be evaluated.  2. Right ventricular systolic function is normal. The right ventricular  size is normal. There is normal pulmonary artery systolic pressure.  3. The mitral valve is normal in structure. Trivial mitral valve  regurgitation.  4. The aortic valve is tricuspid. Aortic valve regurgitation is not  visualized.  5. The inferior vena cava is dilated in size with >50% respiratory    variability, suggesting right atrial pressure of 8 mmHg.   Patient Profile     54 y.o. male with a hx of diabetes mellitus, alcohol induced chronic pancreatitis, hypertension, tobacco abuse and homelessnesswho is being seen for CHF.  Assessment & Plan    Acute systolic heart failure - EF further reduced on echo this admission to 35-40%, previously 40-45% - diuresing on 20 mg lasix daily - appears euvolemic, resting flat this morning - weight is 119 lbs from 136 lbs on admission - continue BB and ARB   CTA chest with coronary calcifications Abnormal nuclear stress test - given the above and reduced EF, will obtain definitive angiography today - continue ASA and statin  The patient understands that risks included but are not limited to stroke (1 in 1000), death (1 in 1000), kidney failure [usually temporary] (1 in 500), bleeding (1 in 200), allergic reaction [possibly serious] (1 in 200).    Pleural effusion - s/p bilateral thoracentesis with removal of 600 cc clear pleural fluid from each side --> appears transudative, likely secondary to CHF   Hypertension - BP elevated yesterday - will increase losartan to 50 mg today   Hyperlipidemia 01/01/2020: Cholesterol 131; HDL 35; LDL Cholesterol 76; Triglycerides 99; VLDL 20 - continue statin   Disposition - patient is plugged into community health and wellness already, should he need DAPT        For questions or updates, please contact [place] Please consult [email or web address] for contact info under        Signed, Ledora Bottcher, PA  01/02/2020, 8:03 AM

## 2020-01-02 NOTE — Progress Notes (Signed)
Physical Therapy Treatment Patient Details Name: Rodney Barber MRN: [id number] DOB: 1966-06-20 Today's Date: 01/02/2020    History of Present Illness 54 yo M PMH alcoholic pancreatitis, htn, DM2 poorly controlled, homelessness, presented to [facility] 5/22 with CC SOB, cough. In ED, pt hypoxic, SpO2 70s on RA. Initially, placed on NRB with SpO2 improvement to 90%. Progressive resp distress and hypoxia was intubated in the emergency room.  Extubated 5/25.     PT Comments    Pt admitted with above diagnosis. Pt was able to ambulate in hall with RW with overall good safety needing cues with transitions only.  Pt however does not withstand challenges to balance well at all.  Pt scored 17/24 on DGI suggesting risk of falls without device. Pt did not use RW prior to coming to hospital and is returning to homeless shelter once d/c'd.  Continue to recommend CIR.   Pt currently with functional limitations due to the deficits listed below (see PT Problem List). Pt will benefit from skilled PT to increase their independence and safety with mobility to allow discharge to the venue listed below.     Follow Up Recommendations  CIR;Supervision/Assistance - 24 hour     Equipment Recommendations  Other (comment)    Recommendations for Other Services       Precautions / Restrictions Precautions Precautions: Fall Restrictions Weight Bearing Restrictions: No    Mobility  Bed Mobility Overal bed mobility: Needs Assistance Bed Mobility: Supine to Sit;Sit to Supine     Supine to sit: Min guard Sit to supine: Min guard      Transfers Overall transfer level: Needs assistance Equipment used: Rolling walker (2 wheeled) Transfers: Sit to/from Stand Sit to Stand: Min guard         General transfer comment: min guard to stand to RW. Cues for hand placement   Ambulation/Gait Ambulation/Gait assistance: Min guard;Min assist Gait Distance (Feet): 200 Feet Assistive device: Rolling walker (2  wheeled) Gait Pattern/deviations: Step-through pattern;Decreased stride length;Wide base of support Gait velocity: reduced Gait velocity interpretation: <1.8 ft/sec, indicate of risk for recurrent falls General Gait Details: Pt still needed cues to keep RW with him when going back to bed.  Pt did better with RW safety overall.  Cannot withstand challenges to balance needing min assist with challenges.     Stairs             Wheelchair Mobility    Modified Rankin (Stroke Patients Only)       Balance Overall balance assessment: Needs assistance Sitting-balance support: Feet supported Sitting balance-Leahy Scale: Fair     Standing balance support: Bilateral upper extremity supported;During functional activity Standing balance-Leahy Scale: Poor Standing balance comment: more safe when using walker correctly but began to leave it behind at end of walk                 Standardized Balance Assessment Standardized Balance Assessment : Dynamic Gait Index   Dynamic Gait Index Level Surface: Normal Change in Gait Speed: Mild Impairment Gait with Horizontal Head Turns: Mild Impairment Gait with Vertical Head Turns: Mild Impairment Gait and Pivot Turn: Mild Impairment Step Over Obstacle: Mild Impairment Step Around Obstacles: Normal Steps: Moderate Impairment Total Score: 17      Cognition Arousal/Alertness: Awake/alert Behavior During Therapy: WFL for tasks assessed/performed Overall Cognitive Status: No family/caregiver present to determine baseline cognitive functioning Area of Impairment: Safety/judgement;Following commands  Following Commands: Follows one step commands with increased time Safety/Judgement: Decreased awareness of safety;Decreased awareness of deficits            Exercises      General Comments General comments (skin integrity, edema, etc.): Pt scored 17/24 on DGI suggesting at risk for falls without device.  Feel that pt needs CIR to progress his balance prior to going back to shelter.       Pertinent Vitals/Pain Pain Assessment: No/denies pain    Home Living                      Prior Function            PT Goals (current goals can now be found in the care plan section) Acute Rehab PT Goals Patient Stated Goal: return to independence Potential to Achieve Goals: Good Progress towards PT goals: Progressing toward goals    Frequency    Min 3X/week      PT Plan Current plan remains appropriate    Co-evaluation              AM-PAC PT "6 Clicks" Mobility   Outcome Measure  Help needed turning from your back to your side while in a flat bed without using bedrails?: None Help needed moving from lying on your back to sitting on the side of a flat bed without using bedrails?: None Help needed moving to and from a bed to a chair (including a wheelchair)?: A Little Help needed standing up from a chair using your arms (e.g., wheelchair or bedside chair)?: A Little Help needed to walk in hospital room?: A Little Help needed climbing 3-5 steps with a railing? : A Lot 6 Click Score: 19    End of Session Equipment Utilized During Treatment: Gait belt Activity Tolerance: Patient limited by fatigue Patient left: in bed;with call bell/phone within reach Nurse Communication: Mobility status PT Visit Diagnosis: Unsteadiness on feet (R26.81);Muscle weakness (generalized) (M62.81)     Time: 7253-6644 PT Time Calculation (min) (ACUTE ONLY): 11 min  Charges:  $Gait Training: 8-22 mins                     Rodney Barber,PT Acute Rehabilitation Services Pager:  [phone]  Office:  [phone]     Rodney Barber 01/02/2020, 12:43 PM

## 2020-01-02 NOTE — Interval H&P Note (Signed)
History and Physical Interval Note:  01/02/2020 12:30 PM  Rodney Barber  has presented today for surgery, with the diagnosis of nstemi.  The various methods of treatment have been discussed with the patient and family. After consideration of risks, benefits and other options for treatment, the patient has consented to  Procedure(s): LEFT HEART CATH AND CORONARY ANGIOGRAPHY (N/A) as a surgical intervention.  The patient's history has been reviewed, patient examined, no change in status, stable for surgery.  I have reviewed the patient's chart and labs.  Questions were answered to the patient's satisfaction.   Cath Lab Visit (complete for each Cath Lab visit)  Clinical Evaluation Leading to the Procedure:   ACS: No.  Non-ACS:    Anginal Classification: CCS I  Anti-ischemic medical therapy: Minimal Therapy (1 class of medications)  Non-Invasive Test Results: High-risk stress test findings: cardiac mortality >3%/year  Prior CABG: No previous CABG        Theron Arista [facility] 01/02/2020 12:30 PM

## 2020-01-02 NOTE — Progress Notes (Signed)
Patient CBG is 458. Jomarie Longs, MD paged and notified. MD relayed to give 14 units of insulin novolog total. Adminstered 14 U. Will notify oncoming night RN and NT to recheck CBG around 2000.

## 2020-01-02 NOTE — Plan of Care (Signed)
  Problem: Health Behavior/Discharge Planning: Goal: Ability to manage health-related needs will improve Outcome: Progressing   Problem: Clinical Measurements: Goal: Ability to maintain clinical measurements within normal limits will improve Outcome: Progressing Goal: Diagnostic test results will improve Outcome: Progressing Goal: Respiratory complications will improve Outcome: Progressing   

## 2020-01-02 NOTE — Progress Notes (Signed)
   Received page from RN about intermittent oozing from TR band. Went to patient's bedside. He is resting comfortably. He has had a hard time remembering not to bend his hand and has had to be repeatedly reminded. He has a strong radial pulses and sensation intact. Slightly edematous just distal to TR band. Recommended splinting wrist so that he can not bend is wrist and then elevating on pillow. Will check CBC to make sure hemoglobin stable.   Corrin Parker, PA-C 01/02/2020 8:08 PM

## 2020-01-02 NOTE — Progress Notes (Signed)
Rehab Admissions Coordinator Note:  Per PT note form yesterday and conversation with PT this AM, this patient still has IP Rehab needs.  At this time, we are recommending an Inpatient Rehab consult. AC will place order per protocol to allow [place] to further follow up and assess his candidacy.   Cheri Rous 01/02/2020, 10:37 AM  I can be reached at [phone].

## 2020-01-02 NOTE — Progress Notes (Addendum)
Inpatient Rehabilitation-Admissions Coordinator   Pt for planned cardiac cath procedure today. As I do not have any open beds for this patient until next week, I will follow up with pt Monday (if he remains in house) for bedside assessment to see if he warrants an IP Rehab admission and to see if he is interested in the program.   Cheri Rous, OTR/L  Rehab Admissions Coordinator  [phone] 01/02/2020 12:48 PM  Addendum: 4:48PM. Noted pt was back from procedure and I was able to meet with him bedside. Briefly discussed CIR program and interest. Pt states he feels pretty steady on his feet at this time and would be more interested in returning to the [facility] sooner rather than later; he states his main concern is just getting connected to his medications more than anything. He states he feels he could manage at [facility] if he can have access to medications through [facility]; will need TOC team to look into this. If pt remains in house this weekend, will have my coworker Vernona Rieger follow up with him to see how he progresses with therapy but anticipate by the time we have an open bed for him, pt will progress past the point of requiring an IP Rehab stay.   Cheri Rous, OTR/L  Rehab Admissions Coordinator  [phone] 01/02/2020 4:53 PM

## 2020-01-02 NOTE — Progress Notes (Signed)
PA Callie had seen patient, splint was applied and educated not to bend.  Elevated right arm.

## 2020-01-02 NOTE — Progress Notes (Signed)
PROGRESS NOTE    Rodney Barber  FUX:[id number] DOB: [birth year] DOA: 12/28/2019 PCP: Elsie Stain, MD  Brief Narrative: 54 yo M PMH alcoholic pancreatitis, htn, DM2 poorly controlled, homelessness, presented to [facility] 5/22 with CC SOB, cough. In ED, pt hypoxic, SpO2 70s on RA. Initially, placed on NRB with SpO2 improvement to 90%. Progressive resp distress and hypoxia was intubated in the emergency room. -Diuresed with IV Lasix, extubated on 5/25 -Transferred to [facility] service 5/26  Assessment & Plan:   Acute hypoxemic respiratory failure -Acute systolic CHF, EF of 02% lower from prior -Extubated 5/25 -Status post bilateral thoracentesis 5/25, fluid is transudative -Appreciate cardiology input -Diuresing well with IV Lasix, 3.6L negative -Continue Coreg, ARB,  -Clinically appears euvolemic, now on p.o. Lasix -Underwent Lexiscan Myoview yesterday which was high risk, had to considerable defects in 2 territories -Plan for left heart catheterization today  Chronic acoholic pancreatitis   -Denies active ongoing alcoholism -Thiamine   COPD/heavy tobacco abuse -Smoking 3 packs/day, counseled -Nebs as needed  DM2  brittle -CBGs improved on Levemir and meal coverage insulin, will decrease dose as he will be n.p.o. -Follow-up hemoglobin A1c  Homelessness  Chronic anemia -Stable  H/o HTN -BP stable, monitor  DVT prophylaxis: Lovenox Code Status: Full code Family Communication: Discussed patient , no family at bedside Disposition Plan:  Status is: Inpatient  Remains inpatient appropriate because:Ongoing diagnostic testing needed not appropriate for outpatient work up ongoing cardiac work-up and treatment   Dispo: The patient is from: Homeless              Anticipated d/c is to: To be determined, CIR being considered              Anticipated d/c date is: 2 days              Patient currently is not medically stable to d/c.   Consultants:    Cardiology  PCCM   Procedures:   Antimicrobials:    Subjective: Feels okay, breathing better, no events overnight, reports being homeless follow-up for a while  Objective: Vitals:   01/02/20 1251 01/02/20 1256 01/02/20 1300 01/02/20 1305  BP: (!) 168/99 (!) 153/90 (!) 156/95 (!) 165/96  Pulse: 71 73 70 71  Resp: 17 12 (!) 45 20  Temp:      TempSrc:      SpO2: 100% 100% 100% 100%  Weight:      Height:        Intake/Output Summary (Last 24 hours) at 01/02/2020 1406 Last data filed at 01/02/2020 1300 Gross per 24 hour  Intake 232 ml  Output --  Net 232 ml   Filed Weights   12/31/19 0105 01/01/20 0000 01/02/20 0359  Weight: 55.9 kg 54.5 kg 54.4 kg    Examination:  General exam: Thinly built pleasant male sitting up in bed, AAOx3, no distress HEENT: Neck no JVD CVS S1-S2, regular rate rhythm Lungs with distant breath sounds otherwise clear Abdomen is soft nontender positive bowel sounds Extremities with no edema Skin: No rashes on exposed skin Psychiatry: Poor insight and judgment   Data Reviewed:   CBC: Recent Labs  Lab 12/28/19 0512 12/28/19 0609 12/29/19 0929 12/30/19 1103 12/31/19 0559 01/01/20 0550 01/02/20 0401  WBC 9.9   < > 14.4* 16.4* 10.4 8.8 9.0  NEUTROABS 8.6*  --   --   --  7.6 6.2 5.5  HGB 8.5*   < > 9.7* 11.2* 10.1* 9.2* 9.3*  HCT 28.4*   < >  30.9* 35.4* 32.2* 29.9* 29.6*  MCV 96.3   < > 93.9 93.2 92.5 93.4 93.4  PLT 311   < > 299 315 325 324 353   < > = values in this interval not displayed.   Basic Metabolic Panel: Recent Labs  Lab 12/28/19 1211 12/28/19 1436 12/29/19 0041 12/29/19 0041 12/29/19 0929 12/29/19 1650 12/30/19 1125 12/30/19 1636 12/31/19 0559 01/01/20 0550 01/02/20 0401  NA 134*   < > 141   < > 138  --  142  --  145 143 142  K 4.8   < > 4.1   < > 4.3  --  3.5  --  3.6 4.1 3.3*  CL 105   < > 112*   < > 114*  --  114*  --  112* 114* 113*  CO2 13*   < > 16*   < > 16*  --  17*  --  20* 22 23  GLUCOSE 607*    < > 106*   < > 107*  --  130*  --  159* 515* 147*  BUN 28*   < > 29*   < > 30*  --  31*  --  23* 17 10  CREATININE 1.39*   < > 1.22   < > 1.25*  --  1.18  --  1.08 1.18 0.81  CALCIUM 8.0*   < > 7.8*   < > 7.7*  --  7.8*  --  8.2* 8.2* 8.0*  MG 1.9  --  1.7  --   --  1.8 1.8 2.3  --   --   --   PHOS 5.9*  --  4.4  --   --  4.2 4.6 5.0*  --   --   --    < > = values in this interval not displayed.   GFR: Estimated Creatinine Clearance: 79.3 mL/min (by C-G formula based on SCr of 0.81 mg/dL). Liver Function Tests: Recent Labs  Lab 12/28/19 0512 12/30/19 1206 12/30/19 1636  AST 35  --   --   ALT 24  --   --   ALKPHOS 221*  --   --   BILITOT 1.4*  --   --   PROT 6.4* 6.0* 6.2*  ALBUMIN 2.3*  --   --    Recent Labs  Lab 12/28/19 2008 12/29/19 0041  LIPASE 14 15   Recent Labs  Lab 12/28/19 0512  AMMONIA 25   Coagulation Profile: No results for input(s): INR, PROTIME in the last 168 hours. Cardiac Enzymes: No results for input(s): CKTOTAL, CKMB, CKMBINDEX, TROPONINI in the last 168 hours. BNP (last 3 results) No results for input(s): PROBNP in the last 8760 hours. HbA1C: Recent Labs    01/01/20 0550  HGBA1C 11.2*   CBG: Recent Labs  Lab 01/01/20 1124 01/01/20 1622 01/01/20 2133 01/02/20 0559 01/02/20 1127  GLUCAP 143* 355* 380* 83 87   Lipid Profile: Recent Labs    12/30/19 1636 01/01/20 0550  CHOL 159 131  HDL  --  35*  LDLCALC  --  76  TRIG  --  99  CHOLHDL  --  3.7   Thyroid Function Tests: No results for input(s): TSH, T4TOTAL, FREET4, T3FREE, THYROIDAB in the last 72 hours. Anemia Panel: No results for input(s): VITAMINB12, FOLATE, FERRITIN, TIBC, IRON, RETICCTPCT in the last 72 hours. Urine analysis:    Component Value Date/Time   COLORURINE YELLOW 12/28/2019 1005   APPEARANCEUR HAZY (A) 12/28/2019 1005  LABSPEC 1.021 12/28/2019 1005   PHURINE 5.0 12/28/2019 1005   GLUCOSEU >=500 (A) 12/28/2019 1005   HGBUR LARGE (A) 12/28/2019 1005    BILIRUBINUR NEGATIVE 12/28/2019 1005   BILIRUBINUR negative 03/15/2018 1049   BILIRUBINUR negative 12/26/2017 0941   KETONESUR 5 (A) 12/28/2019 1005   PROTEINUR >=300 (A) 12/28/2019 1005   UROBILINOGEN 0.2 03/15/2018 1049   UROBILINOGEN 0.2 12/08/2010 1613   NITRITE POSITIVE (A) 12/28/2019 1005   LEUKOCYTESUR MODERATE (A) 12/28/2019 1005   Sepsis Labs: _0 (procalcitonin:4,lacticidven:4)  ) Recent Results (from the past 240 hour(s))  SARS CORONAVIRUS 2 (TAT 6-24 HRS) Nasopharyngeal Nasopharyngeal Swab     Status: None   Collection Time: 12/28/19  4:20 AM   Specimen: Nasopharyngeal Swab  Result Value Ref Range Status   SARS Coronavirus 2 NEGATIVE NEGATIVE Final    Comment: (NOTE) SARS-CoV-2 target nucleic acids are NOT DETECTED. The SARS-CoV-2 RNA is generally detectable in upper and lower respiratory specimens during the acute phase of infection. Negative results do not preclude SARS-CoV-2 infection, do not rule out co-infections with other pathogens, and should not be used as the sole basis for treatment or other patient management decisions. Negative results must be combined with clinical observations, patient history, and epidemiological information. The expected result is Negative. Fact Sheet for Patients: SugarRoll.be Fact Sheet for Healthcare Providers: https://www.woods-mathews.com/ This test is not yet approved or cleared by the [place] FDA and  has been authorized for detection and/or diagnosis of SARS-CoV-2 by FDA under an Emergency Use Authorization (EUA). This EUA will remain  in effect (meaning this test can be used) for the duration of the COVID-19 declaration under Section 56 4(b)(1) of the Act, 21 U.S.C. section 360bbb-3(b)(1), unless the authorization is terminated or revoked sooner. Performed at [facility], [place] [address], [place], [place] [address]   SARS Coronavirus 2 by RT PCR (hospital order,  performed in [facility] hospital lab) Nasopharyngeal Nasopharyngeal Swab     Status: None   Collection Time: 12/28/19  4:22 AM   Specimen: Nasopharyngeal Swab  Result Value Ref Range Status   SARS Coronavirus 2 NEGATIVE NEGATIVE Final    Comment: (NOTE) SARS-CoV-2 target nucleic acids are NOT DETECTED. The SARS-CoV-2 RNA is generally detectable in upper and lower respiratory specimens during the acute phase of infection. The lowest concentration of SARS-CoV-2 viral copies this assay can detect is 250 copies / mL. A negative result does not preclude SARS-CoV-2 infection and should not be used as the sole basis for treatment or other patient management decisions.  A negative result may occur with improper specimen collection / handling, submission of specimen other than nasopharyngeal swab, presence of viral mutation(s) within the areas targeted by this assay, and inadequate number of viral copies (<250 copies / mL). A negative result must be combined with clinical observations, patient history, and epidemiological information. Fact Sheet for Patients:   StrictlyIdeas.no Fact Sheet for Healthcare Providers: BankingDealers.co.za This test is not yet approved or cleared  by the [place] FDA and has been authorized for detection and/or diagnosis of SARS-CoV-2 by FDA under an Emergency Use Authorization (EUA).  This EUA will remain in effect (meaning this test can be used) for the duration of the COVID-19 declaration under Section 564(b)(1) of the Act, 21 U.S.C. section 360bbb-3(b)(1), unless the authorization is terminated or revoked sooner. Performed at [facility], [place] [address] [place],  [address]   Blood Culture (routine x 2)     Status: None  Collection Time: 12/28/19  5:28 AM   Specimen: BLOOD LEFT FOREARM  Result Value Ref Range Status   Specimen Description BLOOD LEFT FOREARM  Final   Special Requests   Final     BOTTLES DRAWN AEROBIC AND ANAEROBIC Blood Culture adequate volume   Culture   Final    NO GROWTH 5 DAYS Performed at [facility], [address] [place], [place] [address]    Report Status 01/02/2020 FINAL  Final  Blood Culture (routine x 2)     Status: None   Collection Time: 12/28/19  5:32 AM   Specimen: BLOOD RIGHT FOREARM  Result Value Ref Range Status   Specimen Description BLOOD RIGHT FOREARM  Final   Special Requests   Final    BOTTLES DRAWN AEROBIC AND ANAEROBIC Blood Culture results may not be optimal due to an inadequate volume of blood received in culture bottles   Culture   Final    NO GROWTH 5 DAYS Performed at [facility], [place] [address] [place], [place] [address]    Report Status 01/02/2020 FINAL  Final  Culture, respiratory (non-expectorated)     Status: None   Collection Time: 12/28/19  6:51 PM   Specimen: Tracheal Aspirate; Respiratory  Result Value Ref Range Status   Specimen Description TRACHEAL ASPIRATE  Final   Special Requests NONE  Final   Gram Stain   Final    ABUNDANT WBC PRESENT, PREDOMINANTLY PMN RARE SQUAMOUS EPITHELIAL CELLS PRESENT FEW GRAM POSITIVE COCCI IN PAIRS RARE GRAM NEGATIVE COCCOBACILLI RARE GRAM POSITIVE RODS    Culture   Final    FEW Consistent with normal respiratory flora. Performed at [facility], [place] [address] [place], [place] [address]    Report Status 12/31/2019 FINAL  Final  MRSA PCR Screening     Status: None   Collection Time: 12/28/19  6:57 PM   Specimen: Nasal Mucosa; Nasopharyngeal  Result Value Ref Range Status   MRSA by PCR NEGATIVE NEGATIVE Final    Comment:        The GeneXpert MRSA Assay (FDA approved for NASAL specimens only), is one component of a comprehensive MRSA colonization surveillance program. It is not intended to diagnose MRSA infection nor to guide or monitor treatment for MRSA infections. Performed at [facility], [place] [address] [place], [place] [address]    Novel Coronavirus, NAA (Hosp order, Send-out to Ref Lab; TAT 18-24 hrs     Status: None   Collection Time: 12/29/19  9:00 AM  Result Value Ref Range Status   SARS-CoV-2, NAA NOT DETECTED NOT DETECTED Final    Comment: (NOTE) This nucleic acid amplification test was developed and its performance characteristics determined by [employer]. Nucleic acid amplification tests include RT-PCR and TMA. This test has not been FDA cleared or approved. This test has been authorized by FDA under an Emergency Use Authorization (EUA). This test is only authorized for the duration of time the declaration that circumstances exist justifying the authorization of the emergency use of in vitro diagnostic tests for detection of SARS-CoV-2 virus and/or diagnosis of COVID-19 infection under section 564(b)(1) of the Act, 21 U.S.C. 597CBU-3(A) (1), unless the authorization is terminated or revoked sooner. When diagnostic testing is negative, the possibility of a false negative result should be considered in the context of a patient's recent exposures and the presence of clinical signs and symptoms consistent with COVID-19. An individual without symptoms of COVID- 19 and who is  not shedding SARS-CoV-2  virus would expect to have a negative (not detected) result in this assay. Performed At: [facility] [address] [place], [place] [id number] Rush Farmer MD DJ:[id number]    [place]  Final    Comment: Performed at [facility], [place] [address] [place], [place] [address]  Body fluid culture     Status: None   Collection Time: 12/30/19  1:02 PM   Specimen: Thoracentesis; Body Fluid  Result Value Ref Range Status   Specimen Description THORACENTESIS  Final   Special Requests NONE  Final   Gram Stain   Final    MODERATE WBC PRESENT,BOTH PMN AND MONONUCLEAR NO ORGANISMS SEEN    Culture   Final    NO GROWTH 3 DAYS Performed at [facility],  [address] [place], [place] [address]    Report Status 01/02/2020 FINAL  Final  Body fluid culture     Status: None (Preliminary result)   Collection Time: 12/30/19  1:02 PM   Specimen: Thoracentesis; Body Fluid  Result Value Ref Range Status   Specimen Description THORACENTESIS  Final   Special Requests Normal  Final   Gram Stain NO WBC SEEN NO ORGANISMS SEEN   Final   Culture   Final    NO GROWTH 2 DAYS NO ANAEROBES ISOLATED; CULTURE IN PROGRESS FOR 5 DAYS Performed at [facility], [address] [place], [place] [address]    Report Status PENDING  Incomplete  Respiratory Panel by PCR     Status: Abnormal   Collection Time: 12/31/19  1:32 PM   Specimen: Nasopharyngeal Swab; Respiratory  Result Value Ref Range Status   Adenovirus NOT DETECTED NOT DETECTED Final   Coronavirus 229E NOT DETECTED NOT DETECTED Final    Comment: (NOTE) The Coronavirus on the Respiratory Panel, DOES NOT test for the novel  Coronavirus (2019 nCoV)    Coronavirus HKU1 NOT DETECTED NOT DETECTED Final   Coronavirus NL63 NOT DETECTED NOT DETECTED Final   Coronavirus OC43 DETECTED (A) NOT DETECTED Final   Metapneumovirus NOT DETECTED NOT DETECTED Final   Rhinovirus / Enterovirus NOT DETECTED NOT DETECTED Final   Influenza A NOT DETECTED NOT DETECTED Final   Influenza B NOT DETECTED NOT DETECTED Final   Parainfluenza Virus 1 NOT DETECTED NOT DETECTED Final   Parainfluenza Virus 2 NOT DETECTED NOT DETECTED Final   Parainfluenza Virus 3 NOT DETECTED NOT DETECTED Final   Parainfluenza Virus 4 NOT DETECTED NOT DETECTED Final   Respiratory Syncytial Virus NOT DETECTED NOT DETECTED Final   Bordetella pertussis NOT DETECTED NOT DETECTED Final   Chlamydophila pneumoniae NOT DETECTED NOT DETECTED Final   Mycoplasma pneumoniae NOT DETECTED NOT DETECTED Final    Comment: Performed at [facility], [place] [address], [place], [place] [address]         Radiology Studies: CARDIAC  CATHETERIZATION  Result Date: 01/02/2020  Prox LAD to Dist LAD lesion is 30% stenosed.  Mid Cx to Dist Cx lesion is 60% stenosed.  Mid RCA lesion is 70% stenosed.  There is severe left ventricular systolic dysfunction.  LV end diastolic pressure is mildly elevated.  The left ventricular ejection fraction is 25-35% by visual estimate.  1. Diffuse coronary atherosclerosis with heavy calcification. 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 Severe LV dysfunction EF estimated at 25-30% 3. Elevated EDP 24 mm Hg Plan: aggressive medical therapy for CHF. Will add aldactone to Coreg, lasix,  and losartan. Titrate as BP allows. High dose statin therapy. LV dysfunction is out of proportion to the degree of CAD.   NM Myocar Multi W/Spect W/Wall Motion / EF  Result Date: 01/01/2020  There was no ST segment deviation noted during stress.  Defect 1: There is a large defect present in the basal inferolateral, mid anteroseptal, mid inferolateral and apical septal location.  Findings consistent with prior myocardial infarction with peri-infarct ischemia.  This is a high risk study.  The left ventricular ejection fraction is severely decreased (<30%).  Abnormal, high risk stress nuclear study with prior inferolateral and septal infarcts with mild peri-infarct ischemia; gated ejection fraction 26% with global hypokinesis.  Moderate left ventricular enlargement.  Study interpreted as high risk due to reduced LV function.        Scheduled Meds: . atorvastatin  80 mg Oral q1800  . carvedilol  12.5 mg Oral BID WC  . chlorhexidine gluconate (MEDLINE KIT)  15 mL Mouth Rinse BID  . Chlorhexidine Gluconate Cloth  6 each Topical Daily  . [START ON 01/03/2020] enoxaparin (LOVENOX) injection  40 mg Subcutaneous Q24H  . feeding supplement (ENSURE ENLIVE)  237 mL Oral TID BM  . furosemide  20 mg Oral Daily  . insulin aspart  0-9 Units Subcutaneous TID WC  . insulin detemir  10 Units Subcutaneous  BID  . lipase/protease/amylase  12,000 Units Oral TID WC  . losartan  50 mg Oral Daily  . pantoprazole (PROTONIX) IV  40 mg Intravenous QHS  . potassium chloride  40 mEq Oral Once  . sodium chloride flush  3 mL Intravenous Q12H  . spironolactone  12.5 mg Oral Daily   Continuous Infusions: . sodium chloride 250 mL (12/31/19 1352)  . sodium chloride       LOS: 5 days    Time spent: 31mn    PDomenic Polite MD [facility]  01/02/2020, 2:06 PM

## 2020-01-03 LAB — CBC WITH DIFFERENTIAL/PLATELET
Abs Immature Granulocytes: 0.07 10*3/uL (ref 0.00–0.07)
Basophils Absolute: 0.1 10*3/uL (ref 0.0–0.1)
Basophils Relative: 1 %
Eosinophils Absolute: 0.2 10*3/uL (ref 0.0–0.5)
Eosinophils Relative: 3 %
HCT: 28.9 % — ABNORMAL LOW (ref 39.0–52.0)
Hemoglobin: 8.9 g/dL — ABNORMAL LOW (ref 13.0–17.0)
Immature Granulocytes: 1 %
Lymphocytes Relative: 29 %
Lymphs Abs: 2.4 10*3/uL (ref 0.7–4.0)
MCH: 28.6 pg (ref 26.0–34.0)
MCHC: 30.8 g/dL (ref 30.0–36.0)
MCV: 92.9 fL (ref 80.0–100.0)
Monocytes Absolute: 0.6 10*3/uL (ref 0.1–1.0)
Monocytes Relative: 7 %
Neutro Abs: 4.9 10*3/uL (ref 1.7–7.7)
Neutrophils Relative %: 59 %
Platelets: 339 10*3/uL (ref 150–400)
RBC: 3.11 MIL/uL — ABNORMAL LOW (ref 4.22–5.81)
RDW: 14.1 % (ref 11.5–15.5)
WBC: 8.3 10*3/uL (ref 4.0–10.5)
nRBC: 0 % (ref 0.0–0.2)

## 2020-01-03 LAB — BASIC METABOLIC PANEL
Anion gap: 6 (ref 5–15)
BUN: 12 mg/dL (ref 6–20)
CO2: 25 mmol/L (ref 22–32)
Calcium: 8.1 mg/dL — ABNORMAL LOW (ref 8.9–10.3)
Chloride: 109 mmol/L (ref 98–111)
Creatinine, Ser: 0.85 mg/dL (ref 0.61–1.24)
GFR calc Af Amer: >60 mL/min (ref >60)
GFR calc non Af Amer: >60 mL/min (ref >60)
Glucose, Bld: 208 mg/dL — ABNORMAL HIGH (ref 70–99)
Potassium: 4.2 mmol/L (ref 3.5–5.1)
Sodium: 140 mmol/L (ref 135–145)

## 2020-01-03 LAB — BODY FLUID CULTURE
Culture: NO GROWTH
Gram Stain: NONE SEEN
Special Requests: NORMAL

## 2020-01-03 LAB — GLUCOSE, CAPILLARY
Glucose-Capillary: 251 mg/dL — ABNORMAL HIGH (ref 70–99)
Glucose-Capillary: 141 mg/dL — ABNORMAL HIGH (ref 70–99)
Glucose-Capillary: 149 mg/dL — ABNORMAL HIGH (ref 70–99)
Glucose-Capillary: 257 mg/dL — ABNORMAL HIGH (ref 70–99)

## 2020-01-03 MED ORDER — NICOTINE 14 MG/24HR TD PT24
14.0000 mg | MEDICATED_PATCH | Freq: Every day | TRANSDERMAL | Status: DC
  Administered 2020-01-03 – 2020-01-06 (×5): 14 mg via TRANSDERMAL
  Filled 2020-01-03 (×5): qty 1

## 2020-01-03 MED ORDER — INSULIN ASPART 100 UNIT/ML ~~LOC~~ SOLN
3.0000 [IU] | Freq: Three times a day (TID) | SUBCUTANEOUS | Status: DC
  Administered 2020-01-03 – 2020-01-04 (×5): 3 [IU] via SUBCUTANEOUS

## 2020-01-03 MED ORDER — INSULIN DETEMIR 100 UNIT/ML ~~LOC~~ SOLN
20.0000 [IU] | Freq: Two times a day (BID) | SUBCUTANEOUS | Status: DC
  Administered 2020-01-03 (×2): 20 [IU] via SUBCUTANEOUS
  Filled 2020-01-03 (×4): qty 0.2

## 2020-01-03 MED ORDER — FUROSEMIDE 10 MG/ML IJ SOLN
80.0000 mg | Freq: Once | INTRAMUSCULAR | Status: AC
  Administered 2020-01-03: 80 mg via INTRAVENOUS
  Filled 2020-01-03: qty 8

## 2020-01-03 MED ORDER — ASPIRIN 81 MG PO CHEW
81.0000 mg | CHEWABLE_TABLET | Freq: Every day | ORAL | Status: DC
  Administered 2020-01-03 – 2020-01-06 (×4): 81 mg via ORAL
  Filled 2020-01-03 (×4): qty 1

## 2020-01-03 NOTE — Progress Notes (Signed)
PROGRESS NOTE    Rodney Barber  BPZ:[id number] DOB: 1966/07/13 DOA: 12/28/2019 PCP: Elsie Stain, MD  Brief Narrative: 54 yo M PMH alcoholic pancreatitis, htn, DM2 poorly controlled, homelessness, presented to [facility] 5/22 with CC SOB, cough. In ED, pt hypoxic, SpO2 70s on RA. Initially, placed on NRB with SpO2 improvement to 90%. Progressive resp distress and hypoxia was intubated in the emergency room. -Diuresed with IV Lasix, extubated on 5/25 -Transferred to [facility] service 5/26  Assessment & Plan:   Acute hypoxemic respiratory failure -Acute systolic CHF, EF of 24% lower from prior -Extubated 5/25 -Status post bilateral thoracentesis 5/25, fluid is transudative -Appreciate cardiology input -Diuresing well with IV Lasix, 3.6L negative -Continue Coreg, ARB,  -Given 80 mg of IV Lasix this morning -Abnormal Myoview, left heart catheterization yesterday noted 70% RCA disease and otherwise nonobstructive CAD, medical management was recommended -Continue statin, add aspirin  Chronic acoholic pancreatitis   -Denies active ongoing alcoholism -Thiamine   COPD/heavy tobacco abuse -Smoking 3 packs/day, counseled -Nebs as needed  DM2  brittle -CBGs fluctuating based on p.o. intake which is erratic -Sugars in the 400 range yesterday, increase Levemir and meal coverage insulin  Homelessness  Chronic anemia -Stable  H/o HTN -BP stable, monitor  DVT prophylaxis: Lovenox Code Status: Full code Family Communication: Discussed patient , no family at bedside Disposition Plan:  Status is: Inpatient  Remains inpatient appropriate because:Ongoing diagnostic testing needed not appropriate for outpatient work up ongoing cardiac work-up and treatment, med titration, homelessness   Dispo: The patient is from: Homeless              Anticipated d/c is to: Likely back to the shelter              Anticipated d/c date is: 1-2 days              Patient currently is not medically stable to  d/c.   Consultants:   Cardiology  PCCM   Procedures:   Antimicrobials:    Subjective: Feels better, was dizzy earlier, breathing overall improving, complains of cough  Objective: Vitals:   01/03/20 0007 01/03/20 0325 01/03/20 0344 01/03/20 0842  BP: (!) 121/98  (!) 150/82 (!) 148/83  Pulse: 74  75 80  Resp: 18  16 16   Temp: 98 F (36.7 C)  98.5 F (36.9 C) 98.6 F (37 C)  TempSrc: Oral  Oral Oral  SpO2: 98%  98% 98%  Weight:  54.7 kg    Height:        Intake/Output Summary (Last 24 hours) at 01/03/2020 1008 Last data filed at 01/03/2020 0600 Gross per 24 hour  Intake 928.13 ml  Output 0 ml  Net 928.13 ml   Filed Weights   01/01/20 0000 01/02/20 0359 01/03/20 0325  Weight: 54.5 kg 54.4 kg 54.7 kg    Examination:  General exam: Thinly built pleasant male sitting up in bed, AAOx3, no distress HEENT: No JVD CVS S1-S2 regular rate rhythm Lungs with distant breath sounds, overall clear Abdomen is soft nontender with positive bowel sounds Extremities with no edema  Skin: No rashes on exposed skin Psychiatry: Poor insight and judgment   Data Reviewed:   CBC: Recent Labs  Lab 12/28/19 0512 12/28/19 0609 12/31/19 0559 01/01/20 0550 01/02/20 0401 01/02/20 2018 01/03/20 0514  WBC 9.9   < > 10.4 8.8 9.0 8.5 8.3  NEUTROABS 8.6*  --  7.6 6.2 5.5  --  4.9  HGB 8.5*   < > 10.1*  9.2* 9.3* 9.5* 8.9*  HCT 28.4*   < > 32.2* 29.9* 29.6* 30.8* 28.9*  MCV 96.3   < > 92.5 93.4 93.4 92.8 92.9  PLT 311   < > 325 324 353 352 339   < > = values in this interval not displayed.   Basic Metabolic Panel: Recent Labs  Lab 12/28/19 1211 12/28/19 1436 12/29/19 0041 12/29/19 0929 12/29/19 1650 12/30/19 1125 12/30/19 1636 12/31/19 0559 01/01/20 0550 01/02/20 0401 01/03/20 0514  NA 134*   < > 141   < >  --  142  --  145 143 142 140  K 4.8   < > 4.1   < >  --  3.5  --  3.6 4.1 3.3* 4.2  CL 105   < > 112*   < >  --  114*  --  112* 114* 113* 109  CO2 13*   < > 16*    < >  --  17*  --  20* 22 23 25   GLUCOSE 607*   < > 106*   < >  --  130*  --  159* 515* 147* 208*  BUN 28*   < > 29*   < >  --  31*  --  23* 17 10 12   CREATININE 1.39*   < > 1.22   < >  --  1.18  --  1.08 1.18 0.81 0.85  CALCIUM 8.0*   < > 7.8*   < >  --  7.8*  --  8.2* 8.2* 8.0* 8.1*  MG 1.9  --  1.7  --  1.8 1.8 2.3  --   --   --   --   PHOS 5.9*  --  4.4  --  4.2 4.6 5.0*  --   --   --   --    < > = values in this interval not displayed.   GFR: Estimated Creatinine Clearance: 76 mL/min (by C-G formula based on SCr of 0.85 mg/dL). Liver Function Tests: Recent Labs  Lab 12/28/19 0512 12/30/19 1206 12/30/19 1636  AST 35  --   --   ALT 24  --   --   ALKPHOS 221*  --   --   BILITOT 1.4*  --   --   PROT 6.4* 6.0* 6.2*  ALBUMIN 2.3*  --   --    Recent Labs  Lab 12/28/19 2008 12/29/19 0041  LIPASE 14 15   Recent Labs  Lab 12/28/19 0512  AMMONIA 25   Coagulation Profile: No results for input(s): INR, PROTIME in the last 168 hours. Cardiac Enzymes: No results for input(s): CKTOTAL, CKMB, CKMBINDEX, TROPONINI in the last 168 hours. BNP (last 3 results) No results for input(s): PROBNP in the last 8760 hours. HbA1C: Recent Labs    01/01/20 0550  HGBA1C 11.2*   CBG: Recent Labs  Lab 01/02/20 1127 01/02/20 1639 01/02/20 1820 01/02/20 2055 01/03/20 0550  GLUCAP 87 458* 460* 383* 251*   Lipid Profile: Recent Labs    01/01/20 0550  CHOL 131  HDL 35*  LDLCALC 76  TRIG 99  CHOLHDL 3.7   Thyroid Function Tests: No results for input(s): TSH, T4TOTAL, FREET4, T3FREE, THYROIDAB in the last 72 hours. Anemia Panel: No results for input(s): VITAMINB12, FOLATE, FERRITIN, TIBC, IRON, RETICCTPCT in the last 72 hours. Urine analysis:    Component Value Date/Time   COLORURINE YELLOW 12/28/2019 1005   APPEARANCEUR HAZY (A) 12/28/2019 1005   LABSPEC 1.021 12/28/2019  1005   PHURINE 5.0 12/28/2019 1005   GLUCOSEU >=500 (A) 12/28/2019 1005   HGBUR LARGE (A) 12/28/2019 1005    BILIRUBINUR NEGATIVE 12/28/2019 1005   BILIRUBINUR negative 03/15/2018 1049   BILIRUBINUR negative 12/26/2017 0941   KETONESUR 5 (A) 12/28/2019 1005   PROTEINUR >=300 (A) 12/28/2019 1005   UROBILINOGEN 0.2 03/15/2018 1049   UROBILINOGEN 0.2 12/08/2010 1613   NITRITE POSITIVE (A) 12/28/2019 1005   LEUKOCYTESUR MODERATE (A) 12/28/2019 1005   Sepsis Labs: @LABRCNTIP (procalcitonin:4,lacticidven:4)  ) Recent Results (from the past 240 hour(s))  SARS CORONAVIRUS 2 (TAT 6-24 HRS) Nasopharyngeal Nasopharyngeal Swab     Status: None   Collection Time: 12/28/19  4:20 AM   Specimen: Nasopharyngeal Swab  Result Value Ref Range Status   SARS Coronavirus 2 NEGATIVE NEGATIVE Final    Comment: (NOTE) SARS-CoV-2 target nucleic acids are NOT DETECTED. The SARS-CoV-2 RNA is generally detectable in upper and lower respiratory specimens during the acute phase of infection. Negative results do not preclude SARS-CoV-2 infection, do not rule out co-infections with other pathogens, and should not be used as the sole basis for treatment or other patient management decisions. Negative results must be combined with clinical observations, patient history, and epidemiological information. The expected result is Negative. Fact Sheet for Patients: SugarRoll.be Fact Sheet for Healthcare Providers: https://www.woods-mathews.com/ This test is not yet approved or cleared by the [place] FDA and  has been authorized for detection and/or diagnosis of SARS-CoV-2 by FDA under an Emergency Use Authorization (EUA). This EUA will remain  in effect (meaning this test can be used) for the duration of the COVID-19 declaration under Section 56 4(b)(1) of the Act, 21 U.S.C. section 360bbb-3(b)(1), unless the authorization is terminated or revoked sooner. Performed at [facility], [place] [address], [place], [place] [address]   SARS Coronavirus 2 by RT PCR (hospital  order, performed in [facility] hospital lab) Nasopharyngeal Nasopharyngeal Swab     Status: None   Collection Time: 12/28/19  4:22 AM   Specimen: Nasopharyngeal Swab  Result Value Ref Range Status   SARS Coronavirus 2 NEGATIVE NEGATIVE Final    Comment: (NOTE) SARS-CoV-2 target nucleic acids are NOT DETECTED. The SARS-CoV-2 RNA is generally detectable in upper and lower respiratory specimens during the acute phase of infection. The lowest concentration of SARS-CoV-2 viral copies this assay can detect is 250 copies / mL. A negative result does not preclude SARS-CoV-2 infection and should not be used as the sole basis for treatment or other patient management decisions.  A negative result may occur with improper specimen collection / handling, submission of specimen other than nasopharyngeal swab, presence of viral mutation(s) within the areas targeted by this assay, and inadequate number of viral copies (<250 copies / mL). A negative result must be combined with clinical observations, patient history, and epidemiological information. Fact Sheet for Patients:   StrictlyIdeas.no Fact Sheet for Healthcare Providers: BankingDealers.co.za This test is not yet approved or cleared  by the [place] FDA and has been authorized for detection and/or diagnosis of SARS-CoV-2 by FDA under an Emergency Use Authorization (EUA).  This EUA will remain in effect (meaning this test can be used) for the duration of the COVID-19 declaration under Section 564(b)(1) of the Act, 21 U.S.C. section 360bbb-3(b)(1), unless the authorization is terminated or revoked sooner. Performed at [facility], [place] [address] [place], [place] [address]   Blood Culture (routine x 2)     Status: None   Collection  Time: 12/28/19  5:28 AM   Specimen: BLOOD LEFT FOREARM  Result Value Ref Range Status   Specimen Description BLOOD LEFT FOREARM  Final   Special Requests    Final    BOTTLES DRAWN AEROBIC AND ANAEROBIC Blood Culture adequate volume   Culture   Final    NO GROWTH 5 DAYS Performed at [facility], [address], [place], [place] [address]    Report Status 01/02/2020 FINAL  Final  Blood Culture (routine x 2)     Status: None   Collection Time: 12/28/19  5:32 AM   Specimen: BLOOD RIGHT FOREARM  Result Value Ref Range Status   Specimen Description BLOOD RIGHT FOREARM  Final   Special Requests   Final    BOTTLES DRAWN AEROBIC AND ANAEROBIC Blood Culture results may not be optimal due to an inadequate volume of blood received in culture bottles   Culture   Final    NO GROWTH 5 DAYS Performed at [facility], [place] [address], [place], [place] [address]    Report Status 01/02/2020 FINAL  Final  Culture, respiratory (non-expectorated)     Status: None   Collection Time: 12/28/19  6:51 PM   Specimen: Tracheal Aspirate; Respiratory  Result Value Ref Range Status   Specimen Description TRACHEAL ASPIRATE  Final   Special Requests NONE  Final   Gram Stain   Final    ABUNDANT WBC PRESENT, PREDOMINANTLY PMN RARE SQUAMOUS EPITHELIAL CELLS PRESENT FEW GRAM POSITIVE COCCI IN PAIRS RARE GRAM NEGATIVE COCCOBACILLI RARE GRAM POSITIVE RODS    Culture   Final    FEW Consistent with normal respiratory flora. Performed at [facility], [place] [address] [place], [place] [address]    Report Status 12/31/2019 FINAL  Final  MRSA PCR Screening     Status: None   Collection Time: 12/28/19  6:57 PM   Specimen: Nasal Mucosa; Nasopharyngeal  Result Value Ref Range Status   MRSA by PCR NEGATIVE NEGATIVE Final    Comment:        The GeneXpert MRSA Assay (FDA approved for NASAL specimens only), is one component of a comprehensive MRSA colonization surveillance program. It is not intended to diagnose MRSA infection nor to guide or monitor treatment for MRSA infections. Performed at [facility], [place] [address], [place],   [address]   Novel Coronavirus, NAA (Hosp order, Send-out to Ref Lab; TAT 18-24 hrs     Status: None   Collection Time: 12/29/19  9:00 AM  Result Value Ref Range Status   SARS-CoV-2, NAA NOT DETECTED NOT DETECTED Final    Comment: (NOTE) This nucleic acid amplification test was developed and its performance characteristics determined by [employer]. Nucleic acid amplification tests include RT-PCR and TMA. This test has not been FDA cleared or approved. This test has been authorized by FDA under an Emergency Use Authorization (EUA). This test is only authorized for the duration of time the declaration that circumstances exist justifying the authorization of the emergency use of in vitro diagnostic tests for detection of SARS-CoV-2 virus and/or diagnosis of COVID-19 infection under section 564(b)(1) of the Act, 21 U.S.C. 643PIR-5(J) (1), unless the authorization is terminated or revoked sooner. When diagnostic testing is negative, the possibility of a false negative result should be considered in the context of a patient's recent exposures and the presence of clinical signs and symptoms consistent with COVID-19. An individual without symptoms of COVID- 19 and who is not  shedding SARS-CoV-2  virus would expect to have a negative (not detected) result in this assay. Performed At: [facility] [address] [place] [address] [place], [place] [id number] Rush Farmer MD JH:[id number]    [place]  Final    Comment: Performed at [facility], [place] [address] [place], [place] [address]  Body fluid culture     Status: None   Collection Time: 12/30/19  1:02 PM   Specimen: Thoracentesis; Body Fluid  Result Value Ref Range Status   Specimen Description THORACENTESIS  Final   Special Requests NONE  Final   Gram Stain   Final    MODERATE WBC PRESENT,BOTH PMN AND MONONUCLEAR NO ORGANISMS SEEN    Culture   Final    NO GROWTH 3 DAYS Performed at [place], [address], [place], [place] [address]    Report Status 01/02/2020 FINAL  Final  Body fluid culture     Status: None   Collection Time: 12/30/19  1:02 PM   Specimen: Thoracentesis; Body Fluid  Result Value Ref Range Status   Specimen Description THORACENTESIS  Final   Special Requests Normal  Final   Gram Stain NO WBC SEEN NO ORGANISMS SEEN   Final   Culture   Final    NO GROWTH 3 DAYS Performed at [facility], [address] [place], [place] [address]    Report Status 01/03/2020 FINAL  Final  Respiratory Panel by PCR     Status: Abnormal   Collection Time: 12/31/19  1:32 PM   Specimen: Nasopharyngeal Swab; Respiratory  Result Value Ref Range Status   Adenovirus NOT DETECTED NOT DETECTED Final   Coronavirus 229E NOT DETECTED NOT DETECTED Final    Comment: (NOTE) The Coronavirus on the Respiratory Panel, DOES NOT test for the novel  Coronavirus (2019 nCoV)    Coronavirus HKU1 NOT DETECTED NOT DETECTED Final   Coronavirus NL63 NOT DETECTED NOT DETECTED Final   Coronavirus OC43 DETECTED (A) NOT DETECTED Final   Metapneumovirus NOT DETECTED NOT DETECTED Final   Rhinovirus / Enterovirus NOT DETECTED NOT DETECTED Final   Influenza A NOT DETECTED NOT DETECTED Final   Influenza B NOT DETECTED NOT DETECTED Final   Parainfluenza Virus 1 NOT DETECTED NOT DETECTED Final   Parainfluenza Virus 2 NOT DETECTED NOT DETECTED Final   Parainfluenza Virus 3 NOT DETECTED NOT DETECTED Final   Parainfluenza Virus 4 NOT DETECTED NOT DETECTED Final   Respiratory Syncytial Virus NOT DETECTED NOT DETECTED Final   Bordetella pertussis NOT DETECTED NOT DETECTED Final   Chlamydophila pneumoniae NOT DETECTED NOT DETECTED Final   Mycoplasma pneumoniae NOT DETECTED NOT DETECTED Final    Comment: Performed at [facility], [place] [address] [place], [place] [address]         Radiology Studies: CARDIAC CATHETERIZATION  Result Date: 01/02/2020  Prox LAD to Dist LAD lesion is 30%  stenosed.  Mid Cx to Dist Cx lesion is 60% stenosed.  Mid RCA lesion is 70% stenosed.  There is severe left ventricular systolic dysfunction.  LV end diastolic pressure is mildly elevated.  The left ventricular ejection fraction is 25-35% by visual estimate.  1. Diffuse coronary atherosclerosis with heavy calcification. 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 Severe LV dysfunction EF estimated at 25-30% 3. Elevated EDP 24 mm Hg Plan: aggressive medical therapy for CHF. Will add aldactone to Coreg, lasix, and losartan. Titrate as BP allows. High dose statin therapy. LV  dysfunction is out of proportion to the degree of CAD.   NM Myocar Multi W/Spect W/Wall Motion / EF  Result Date: 01/01/2020  There was no ST segment deviation noted during stress.  Defect 1: There is a large defect present in the basal inferolateral, mid anteroseptal, mid inferolateral and apical septal location.  Findings consistent with prior myocardial infarction with peri-infarct ischemia.  This is a high risk study.  The left ventricular ejection fraction is severely decreased (<30%).  Abnormal, high risk stress nuclear study with prior inferolateral and septal infarcts with mild peri-infarct ischemia; gated ejection fraction 26% with global hypokinesis.  Moderate left ventricular enlargement.  Study interpreted as high risk due to reduced LV function.        Scheduled Meds: . atorvastatin  80 mg Oral q1800  . carvedilol  12.5 mg Oral BID WC  . chlorhexidine gluconate (MEDLINE KIT)  15 mL Mouth Rinse BID  . Chlorhexidine Gluconate Cloth  6 each Topical Daily  . enoxaparin (LOVENOX) injection  40 mg Subcutaneous Q24H  . feeding supplement (ENSURE ENLIVE)  237 mL Oral TID BM  . insulin aspart  0-9 Units Subcutaneous TID WC  . insulin aspart  3 Units Subcutaneous TID WC  . insulin detemir  20 Units Subcutaneous BID  . lipase/protease/amylase  12,000 Units Oral TID WC  . losartan  50 mg Oral Daily   . pantoprazole (PROTONIX) IV  40 mg Intravenous QHS  . sodium chloride flush  3 mL Intravenous Q12H  . spironolactone  12.5 mg Oral Daily   Continuous Infusions: . sodium chloride 250 mL (12/31/19 1352)  . sodium chloride       LOS: 6 days    Time spent: 58mn  PDomenic Polite MD [facility]  01/03/2020, 10:08 AM

## 2020-01-03 NOTE — Progress Notes (Signed)
Cardiology Progress Note  Patient ID: Rodney Barber MRN: [id number] DOB: 08/16/1965 Date of Encounter: 01/03/2020  Primary Cardiologist: Peter Martinique, MD  Subjective   Chief Complaint: SOB  HPI: LHC with moderate CAD. EF drop out of proportion to EF. 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 No issues at R radial cath site. Reports breathing still not back to baseline. LVEDP 24 mmHG.   ROS:  All other ROS reviewed and negative. Pertinent positives noted in the HPI.     Inpatient Medications  Scheduled Meds: . atorvastatin  80 mg Oral q1800  . carvedilol  12.5 mg Oral BID WC  . chlorhexidine gluconate (MEDLINE KIT)  15 mL Mouth Rinse BID  . Chlorhexidine Gluconate Cloth  6 each Topical Daily  . enoxaparin (LOVENOX) injection  40 mg Subcutaneous Q24H  . feeding supplement (ENSURE ENLIVE)  237 mL Oral TID BM  . furosemide  80 mg Intravenous Once  . insulin aspart  0-9 Units Subcutaneous TID WC  . insulin detemir  10 Units Subcutaneous BID  . lipase/protease/amylase  12,000 Units Oral TID WC  . losartan  50 mg Oral Daily  . pantoprazole (PROTONIX) IV  40 mg Intravenous QHS  . sodium chloride flush  3 mL Intravenous Q12H  . spironolactone  12.5 mg Oral Daily   Continuous Infusions: . sodium chloride 250 mL (12/31/19 1352)  . sodium chloride     PRN Meds: sodium chloride, sodium chloride, acetaminophen, dextrose, docusate sodium, ondansetron (ZOFRAN) IV, polyethylene glycol, sodium chloride flush   Vital Signs   Vitals:   01/02/20 1720 01/03/20 0007 01/03/20 0325 01/03/20 0344  BP: (!) 146/76 (!) 121/98  (!) 150/82  Pulse: 65 74  75  Resp:  18  16  Temp:  98 F (36.7 C)  98.5 F (36.9 C)  TempSrc:  Oral  Oral  SpO2:  98%  98%  Weight:   54.7 kg   Height:        Intake/Output Summary (Last 24 hours) at 01/03/2020 0744 Last data filed at 01/03/2020 0600 Gross per 24 hour  Intake 938.13 ml  Output 0 ml  Net 938.13 ml   Last 3 Weights 01/03/2020 01/02/2020 01/01/2020    Weight (lbs) 120 lb 11.2 oz 119 lb 14.4 oz 120 lb 1.6 oz  Weight (kg) 54.749 kg 54.386 kg 54.477 kg      Telemetry  Overnight telemetry shows SR 70s, which I personally reviewed.   ECG  The most recent ECG shows ST 109, LVH, which I personally reviewed.   Physical Exam   Vitals:   01/02/20 1720 01/03/20 0007 01/03/20 0325 01/03/20 0344  BP: (!) 146/76 (!) 121/98  (!) 150/82  Pulse: 65 74  75  Resp:  18  16  Temp:  98 F (36.7 C)  98.5 F (36.9 C)  TempSrc:  Oral  Oral  SpO2:  98%  98%  Weight:   54.7 kg   Height:         Intake/Output Summary (Last 24 hours) at 01/03/2020 0744 Last data filed at 01/03/2020 0600 Gross per 24 hour  Intake 938.13 ml  Output 0 ml  Net 938.13 ml    Last 3 Weights 01/03/2020 01/02/2020 01/01/2020  Weight (lbs) 120 lb 11.2 oz 119 lb 14.4 oz 120 lb 1.6 oz  Weight (kg) 54.749 kg 54.386 kg 54.477 kg    Body mass index is 18.9 kg/m.   General: Well nourished, well developed, in no acute distress Head: Atraumatic, normal size  Eyes: PEERLA, EOMI  Neck: Supple, JVD 12-15 cm H2O, +HJR Endocrine: No thryomegaly Cardiac: Normal S1, S2; RRR; no murmurs, rubs, or gallops Lungs: Clear to auscultation bilaterally, no wheezing, rhonchi or rales  Abd: Soft, nontender, no hepatomegaly  Ext: 1+ edema, R radial cath site clean and dry without hematoma  Musculoskeletal: No deformities, BUE and BLE strength normal and equal Skin: Warm and dry, no rashes   Neuro: Alert and oriented to person, place, time, and situation, CNII-XII grossly intact, no focal deficits  Psych: Normal mood and affect   Labs  High Sensitivity Troponin:   Recent Labs  Lab 12/28/19 0512 12/28/19 0643  TROPONINIHS 54* 64*     Cardiac EnzymesNo results for input(s): TROPONINI in the last 168 hours. No results for input(s): TROPIPOC in the last 168 hours.  Chemistry Recent Labs  Lab 12/28/19 0512 12/28/19 0609 12/30/19 1206 12/30/19 1636 12/31/19 0559 01/01/20 0550  01/02/20 0401 01/03/20 0514  NA 134*   < >  --   --    < > 143 142 140  K 4.2   < >  --   --    < > 4.1 3.3* 4.2  CL 105   < >  --   --    < > 114* 113* 109  CO2 17*   < >  --   --    < > 22 23 25   GLUCOSE 526*   < >  --   --    < > 515* 147* 208*  BUN 20   < >  --   --    < > 17 10 12   CREATININE 1.15   < >  --   --    < > 1.18 0.81 0.85  CALCIUM 8.0*   < >  --   --    < > 8.2* 8.0* 8.1*  PROT 6.4*  --  6.0* 6.2*  --   --   --   --   ALBUMIN 2.3*  --   --   --   --   --   --   --   AST 35  --   --   --   --   --   --   --   ALT 24  --   --   --   --   --   --   --   ALKPHOS 221*  --   --   --   --   --   --   --   BILITOT 1.4*  --   --   --   --   --   --   --   GFRNONAA >60   < >  --   --    < > >60 >60 >60  GFRAA >60   < >  --   --    < > >60 >60 >60  ANIONGAP 12   < >  --   --    < > 7 6 6    < > = values in this interval not displayed.    Hematology Recent Labs  Lab 01/02/20 0401 01/02/20 2018 01/03/20 0514  WBC 9.0 8.5 8.3  RBC 3.17* 3.32* 3.11*  HGB 9.3* 9.5* 8.9*  HCT 29.6* 30.8* 28.9*  MCV 93.4 92.8 92.9  MCH 29.3 28.6 28.6  MCHC 31.4 30.8 30.8  RDW 14.2 14.2 14.1  PLT 353 352 339   BNP Recent Labs  Lab 12/28/19 0512  BNP  544.4*    DDimer  Recent Labs  Lab 12/28/19 0512 12/29/19 0041  DDIMER 3.06* 1.64*     Radiology  CARDIAC CATHETERIZATION  Result Date: 01/02/2020  Prox LAD to Dist LAD lesion is 30% stenosed.  Mid Cx to Dist Cx lesion is 60% stenosed.  Mid RCA lesion is 70% stenosed.  There is severe left ventricular systolic dysfunction.  LV end diastolic pressure is mildly elevated.  The left ventricular ejection fraction is 25-35% by visual estimate.  1. Diffuse coronary atherosclerosis with heavy calcification. 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 Severe LV dysfunction EF estimated at 25-30% 3. Elevated EDP 24 mm Hg Plan: aggressive medical therapy for CHF. Will add aldactone to Coreg, lasix, and losartan. Titrate as  BP allows. High dose statin therapy. LV dysfunction is out of proportion to the degree of CAD.   NM Myocar Multi W/Spect W/Wall Motion / EF  Result Date: 01/01/2020  There was no ST segment deviation noted during stress.  Defect 1: There is a large defect present in the basal inferolateral, mid anteroseptal, mid inferolateral and apical septal location.  Findings consistent with prior myocardial infarction with peri-infarct ischemia.  This is a high risk study.  The left ventricular ejection fraction is severely decreased (<30%).  Abnormal, high risk stress nuclear study with prior inferolateral and septal infarcts with mild peri-infarct ischemia; gated ejection fraction 26% with global hypokinesis.  Moderate left ventricular enlargement.  Study interpreted as high risk due to reduced LV function.    Cardiac Studies  LHC 01/02/2020  Prox LAD to Dist LAD lesion is 30% stenosed.  Mid Cx to Dist Cx lesion is 60% stenosed.  Mid RCA lesion is 70% stenosed.  There is severe left ventricular systolic dysfunction.  LV end diastolic pressure is mildly elevated.  The left ventricular ejection fraction is 25-35% by visual estimate.   1. Diffuse coronary atherosclerosis with heavy calcification. 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 Severe LV dysfunction EF estimated at 25-30% 3. Elevated EDP 24 mm Hg  Plan: aggressive medical therapy for CHF. Will add aldactone to Coreg, lasix, and losartan. Titrate as BP allows. High dose statin therapy. LV dysfunction is out of proportion to the degree of CAD.  TTE 12/28/2019 1. Left ventricular ejection fraction, by estimation, is 35 to 40%. The  left ventricle has moderately decreased function. The left ventricle  demonstrates global hypokinesis. There is moderate concentric left  ventricular hypertrophy. Left ventricular  diastolic function could not be evaluated.  2. Right ventricular systolic function is normal. The right  ventricular  size is normal. There is normal pulmonary artery systolic pressure.  3. The mitral valve is normal in structure. Trivial mitral valve  regurgitation.  4. The aortic valve is tricuspid. Aortic valve regurgitation is not  visualized.  5. The inferior vena cava is dilated in size with >50% respiratory  variability, suggesting right atrial pressure of 8 mmHg.  Patient Profile  Rodney Barber is a 54 y.o. male with DM, HTN, tobacco abuse, etoh pancreatitis admitted 12/28/2019 for acute hypoxic respiratory failure 2/2 CHF.   Assessment & Plan   1. Acute on Chronic Systolic HF, 71-69% -EF drop to 35% -LHC with moderate CAD but EF drop out of proportion to CAD, non-ischemic etiology supsected  -LVEDP 24 mmHG; 80 mg IV lasix x 1 today -continue coreg 12.5 mg BID, losartan 50 mg QD, aldactone 12.5 mg QD; titrate up as able -transition to oral lasix  pending urine output   2. CAD -non-obstructive on cath -continue ASA/statin  -cath site clean and dry   For questions or updates, please contact [place] Please consult [email or web address] for contact info under   Time Spent with Patient: I have spent a total of 25 minutes with patient reviewing hospital notes, telemetry, EKGs, labs and examining the patient as well as establishing an assessment and plan that was discussed with the patient.  > 50% of time was spent in direct patient care.    Signed, Addison Naegeli. Audie Box, [place]  01/03/2020 7:44 AM

## 2020-01-03 NOTE — Progress Notes (Signed)
I found this patient's Nicoderm patch on the floor at approx. 1630 and it was applied at 1245. I notified the nurse.   Debbe Mounts, Student RN

## 2020-01-03 NOTE — Progress Notes (Signed)
Occupational Therapy Treatment Patient Details Name: Rodney Barber MRN: [id number] DOB: [birth year] Today's Date: 01/03/2020    History of present illness 54 yo M PMH alcoholic pancreatitis, htn, DM2 poorly controlled, homelessness, presented to [facility] 5/22 with CC SOB, cough. In ED, pt hypoxic, SpO2 70s on RA. Initially, placed on NRB with SpO2 improvement to 90%. Progressive resp distress and hypoxia was intubated in the emergency room.  Extubated 5/25.    OT comments  Patient continues to make steady progress towards goals in skilled OT session. Patient's session encompassed functional ADLs, energy conservation techniques, and increased testing of dynamic balance. Pt with marked improvement in session to date, now managing bed mobility and transfers at a Mod I level. Pt also able to maneuver around room without use of AD with balance being challenged by reaching down to his socks in standing to adjust. Discharge remains appropriate at this time as pt wants to get back to the [facility] as long as he can ger his medications taken care of; will continue to follow acutely.    Follow Up Recommendations  Home health OT;Outpatient OT;Other (comment)(Pending ability to get appointments)    Equipment Recommendations  None recommended by OT    Recommendations for Other Services      Precautions / Restrictions Precautions Precautions: Fall Restrictions Weight Bearing Restrictions: No       Mobility Bed Mobility Overal bed mobility: Modified Independent Bed Mobility: Supine to Sit;Sit to Supine     Supine to sit: Modified independent (Device/Increase time) Sit to supine: Modified independent (Device/Increase time)      Transfers Overall transfer level: Modified independent   Transfers: Sit to/from Stand Sit to Stand: Supervision         General transfer comment: Able to manuever all aroud room without use of AD or reaching out for environment to brace    Balance Overall  balance assessment: Modified Independent Sitting-balance support: Feet supported Sitting balance-Leahy Scale: Good     Standing balance support: No upper extremity supported;During functional activity;Single extremity supported Standing balance-Leahy Scale: Good Standing balance comment: Able to don and doff pants with sit<>stand x3 able to reach down to socks with one hand minimally braced on sink                           ADL either performed or assessed with clinical judgement   ADL Overall ADL's : Modified independent                     Lower Body Dressing: Cueing for safety;Supervision/safety Lower Body Dressing Details (indicate cue type and reason): Cues for safety for energy conservation of sitting to don pants, demonstrated ability to reach down to feet in standing (one hand braced on sink) to adjust socks Toilet Transfer: Modified Independent;Ambulation Toilet Transfer Details (indicate cue type and reason): Simulated with recliner         Functional mobility during ADLs: Supervision/safety;Modified independent General ADL Comments: Pt now close to mod I level for basic ADL tasks     Vision       Perception     Praxis      Cognition Arousal/Alertness: Awake/alert Behavior During Therapy: WFL for tasks assessed/performed Overall Cognitive Status: Within Functional Limits for tasks assessed  Exercises     Shoulder Instructions       General Comments      Pertinent Vitals/ Pain       Pain Assessment: No/denies pain  Home Living                                          Prior Functioning/Environment              Frequency  Min 2X/week        Progress Toward Goals  OT Goals(current goals can now be found in the care plan section)  Progress towards OT goals: Progressing toward goals  Acute Rehab OT Goals Patient Stated Goal: return to  independence OT Goal Formulation: With patient Time For Goal Achievement: 01/15/20 Potential to Achieve Goals: Good  Plan      Co-evaluation                 AM-PAC OT "6 Clicks" Daily Activity     Outcome Measure   Help from another person eating meals?: None Help from another person taking care of personal grooming?: None Help from another person toileting, which includes using toliet, bedpan, or urinal?: None Help from another person bathing (including washing, rinsing, drying)?: A Little Help from another person to put on and taking off regular upper body clothing?: None Help from another person to put on and taking off regular lower body clothing?: None 6 Click Score: 23    End of Session    OT Visit Diagnosis: Unsteadiness on feet (R26.81);Other abnormalities of gait and mobility (R26.89);Other symptoms and signs involving cognitive function   Activity Tolerance Patient tolerated treatment well   Patient Left in bed;with call bell/phone within reach   Nurse Communication Mobility status        Time: 1129-1140 OT Time Calculation (min): 11 min  Charges: OT General Charges $OT Visit: 1 Visit OT Treatments $Self Care/Home Management : 8-22 mins  Pollyann Glen E. Hisae Decoursey, COTA/L Acute Rehabilitation Services [phone]   Cherlyn Cushing 01/03/2020, 12:45 PM

## 2020-01-04 ENCOUNTER — Encounter (HOSPITAL_COMMUNITY): Payer: Self-pay | Admitting: Emergency Medicine

## 2020-01-04 LAB — GLUCOSE, CAPILLARY
Glucose-Capillary: 45 mg/dL — ABNORMAL LOW (ref 70–99)
Glucose-Capillary: 94 mg/dL (ref 70–99)
Glucose-Capillary: 207 mg/dL — ABNORMAL HIGH (ref 70–99)
Glucose-Capillary: 136 mg/dL — ABNORMAL HIGH (ref 70–99)
Glucose-Capillary: 160 mg/dL — ABNORMAL HIGH (ref 70–99)

## 2020-01-04 LAB — BASIC METABOLIC PANEL
Anion gap: 6 (ref 5–15)
BUN: 13 mg/dL (ref 6–20)
CO2: 27 mmol/L (ref 22–32)
Calcium: 8.2 mg/dL — ABNORMAL LOW (ref 8.9–10.3)
Chloride: 108 mmol/L (ref 98–111)
Creatinine, Ser: 0.86 mg/dL (ref 0.61–1.24)
GFR calc Af Amer: >60 mL/min (ref >60)
GFR calc non Af Amer: >60 mL/min (ref >60)
Glucose, Bld: 69 mg/dL — ABNORMAL LOW (ref 70–99)
Potassium: 3.9 mmol/L (ref 3.5–5.1)
Sodium: 141 mmol/L (ref 135–145)

## 2020-01-04 MED ORDER — LOSARTAN POTASSIUM 50 MG PO TABS
100.0000 mg | ORAL_TABLET | Freq: Every day | ORAL | Status: DC
  Administered 2020-01-05 – 2020-01-06 (×2): 100 mg via ORAL
  Filled 2020-01-04 (×2): qty 2

## 2020-01-04 MED ORDER — INSULIN DETEMIR 100 UNIT/ML ~~LOC~~ SOLN
18.0000 [IU] | Freq: Two times a day (BID) | SUBCUTANEOUS | Status: DC
  Administered 2020-01-04 (×2): 18 [IU] via SUBCUTANEOUS
  Filled 2020-01-04 (×3): qty 0.18

## 2020-01-04 MED ORDER — PANTOPRAZOLE SODIUM 40 MG PO TBEC
40.0000 mg | DELAYED_RELEASE_TABLET | Freq: Every day | ORAL | Status: DC
  Administered 2020-01-05 – 2020-01-06 (×2): 40 mg via ORAL
  Filled 2020-01-04 (×2): qty 1

## 2020-01-04 MED ORDER — TORSEMIDE 20 MG PO TABS
20.0000 mg | ORAL_TABLET | Freq: Every day | ORAL | Status: DC
  Administered 2020-01-04 – 2020-01-06 (×3): 20 mg via ORAL
  Filled 2020-01-04 (×3): qty 1

## 2020-01-04 MED ORDER — CARVEDILOL 25 MG PO TABS
25.0000 mg | ORAL_TABLET | Freq: Two times a day (BID) | ORAL | Status: DC
  Administered 2020-01-04 – 2020-01-06 (×5): 25 mg via ORAL
  Filled 2020-01-04 (×5): qty 1

## 2020-01-04 NOTE — Progress Notes (Signed)
PROGRESS NOTE    Rodney Barber  EPP:[id number] DOB: Apr 20, 1966 DOA: 12/28/2019 PCP: Elsie Stain, MD  Brief Narrative: 54 yo homeless male with history of alcoholic pancreatitis, htn, DM2 poorly controlled, presented to [facility] 5/22 with CC SOB, cough. In ED, pt hypoxic, SpO2 70s on RA. Initially, placed on NRB , progressive resp distress and hypoxia was intubated in the emergency room. -Diuresed with IV Lasix, extubated on 5/25 -Transferred to [facility] service 5/26  Assessment & Plan:   Acute hypoxemic respiratory failure -Acute systolic CHF, EF of 60% lower from prior -Extubated 5/25 -Status post bilateral thoracentesis 5/25, fluid is transudative -Appreciate cardiology input -Diuresing well with IV Lasix, 1.9 L negative -Continue Coreg, ARB,  -Transition to oral diuretics today -Abnormal Myoview, left heart catheterization-noted 70% RCA disease and otherwise nonobstructive CAD, medical management was recommended -Continue statin, added aspirin -Ambulate, increase activity today -Discharge planning  Chronic acoholic pancreatitis   -Denies active ongoing alcoholism -Thiamine   COPD/heavy tobacco abuse -Smoking 3 packs/day, counseled -Nebs PRN  DM2  brittle -Very brittle diabetic, blood sugars fluctuating from 400-500 range, this morning hypoglycemic, p.o. intake is very erratic and excessive on occasions -Decrease dose of Levemir, continue meal coverage with meals, monitor  Homelessness  Chronic anemia -Stable  H/o HTN -BP stable, monitor  DVT prophylaxis: Lovenox Code Status: Full code Family Communication: Discussed patient , no family at bedside Disposition Plan:  Status is: Inpatient  Remains inpatient appropriate because:Ongoing diagnostic testing needed not appropriate for outpatient work up ongoing cardiac treatment, med titration, homelessness, hypoglycemia   Dispo: The patient is from: Homeless              Anticipated d/c is to: Likely back to the  shelter              Anticipated d/c date is: 1-2 days              Patient currently is not medically stable to d/c.   Consultants:   Cardiology  PCCM   Procedures:   Antimicrobials:    Subjective: Overall improving, still short of breath with activity, blood sugars in the 40s this morning  Objective: Vitals:   01/03/20 2026 01/04/20 0014 01/04/20 0539 01/04/20 0848  BP: 132/68  139/72 (!) 147/80  Pulse: 77  75 77  Resp: _0 Temp: 98.2 F (36.8 C)  98.5 F (36.9 C) 98.4 F (36.9 C)  TempSrc: Oral  Oral Oral  SpO2: 99%  100% 98%  Weight:  54.3 kg    Height:        Intake/Output Summary (Last 24 hours) at 01/04/2020 1119 Last data filed at 01/04/2020 0900 Gross per 24 hour  Intake 843 ml  Output 1225 ml  Net -382 ml   Filed Weights   01/02/20 0359 01/03/20 0325 01/04/20 0014  Weight: 54.4 kg 54.7 kg 54.3 kg    Examination:  General: Thinly built pleasant male sitting up in bed, AAOx3, no distress  HEENT: No JVD  CVS: S1-S2, regular rate rhythm Lungs: Few basilar rales Abdomen is soft, nontender, positive bowel sounds Extremities: No edema Skin: No rashes on exposed skin Psychiatry: Poor insight and judgment   Data Reviewed:   CBC: Recent Labs  Lab 12/31/19 0559 01/01/20 0550 01/02/20 0401 01/02/20 2018 01/03/20 0514  WBC 10.4 8.8 9.0 8.5 8.3  NEUTROABS 7.6 6.2 5.5  --  4.9  HGB 10.1* 9.2* 9.3* 9.5* 8.9*  HCT 32.2* 29.9* 29.6* 30.8* 28.9*  MCV  92.5 93.4 93.4 92.8 92.9  PLT 325 324 353 352 960   Basic Metabolic Panel: Recent Labs  Lab 12/28/19 1211 12/28/19 1436 12/29/19 0041 12/29/19 0929 12/29/19 1650 12/30/19 1125 12/30/19 1125 12/30/19 1636 12/31/19 0559 01/01/20 0550 01/02/20 0401 01/03/20 0514 01/04/20 0625  NA 134*   < > 141   < >  --  142   < >  --  145 143 142 140 141  K 4.8   < > 4.1   < >  --  3.5   < >  --  3.6 4.1 3.3* 4.2 3.9  CL 105   < > 112*   < >  --  114*   < >  --  112* 114* 113* 109 108  CO2 13*    < > 16*   < >  --  17*   < >  --  20* _0 GLUCOSE 607*   < > 106*   < >  --  130*   < >  --  159* 515* 147* 208* 69*  BUN 28*   < > 29*   < >  --  31*   < >  --  23* _1 CREATININE 1.39*   < > 1.22   < >  --  1.18   < >  --  1.08 1.18 0.81 0.85 0.86  CALCIUM 8.0*   < > 7.8*   < >  --  7.8*   < >  --  8.2* 8.2* 8.0* 8.1* 8.2*  MG 1.9  --  1.7  --  1.8 1.8  --  2.3  --   --   --   --   --   PHOS 5.9*  --  4.4  --  4.2 4.6  --  5.0*  --   --   --   --   --    < > = values in this interval not displayed.   GFR: Estimated Creatinine Clearance: 74.5 mL/min (by C-G formula based on SCr of 0.86 mg/dL). Liver Function Tests: Recent Labs  Lab 12/30/19 1206 12/30/19 1636  PROT 6.0* 6.2*   Recent Labs  Lab 12/28/19 2008 12/29/19 0041  LIPASE 14 15   No results for input(s): AMMONIA in the last 168 hours. Coagulation Profile: No results for input(s): INR, PROTIME in the last 168 hours. Cardiac Enzymes: No results for input(s): CKTOTAL, CKMB, CKMBINDEX, TROPONINI in the last 168 hours. BNP (last 3 results) No results for input(s): PROBNP in the last 8760 hours. HbA1C: No results for input(s): HGBA1C in the last 72 hours. CBG: Recent Labs  Lab 01/03/20 2101 01/04/20 0553 01/04/20 0556 01/04/20 0623 01/04/20 1116  GLUCAP 257* 43* 45* 94 207*   Lipid Profile: No results for input(s): CHOL, HDL, LDLCALC, TRIG, CHOLHDL, LDLDIRECT in the last 72 hours. Thyroid Function Tests: No results for input(s): TSH, T4TOTAL, FREET4, T3FREE, THYROIDAB in the last 72 hours. Anemia Panel: No results for input(s): VITAMINB12, FOLATE, FERRITIN, TIBC, IRON, RETICCTPCT in the last 72 hours. Urine analysis:    Component Value Date/Time   COLORURINE YELLOW 12/28/2019 1005   APPEARANCEUR HAZY (A) 12/28/2019 1005   LABSPEC 1.021 12/28/2019 1005   PHURINE 5.0 12/28/2019 1005   GLUCOSEU >=500 (A) 12/28/2019 1005   HGBUR LARGE (A) 12/28/2019 1005   BILIRUBINUR NEGATIVE 12/28/2019 1005     BILIRUBINUR negative 03/15/2018 1049   BILIRUBINUR negative 12/26/2017 0941  KETONESUR 5 (A) 12/28/2019 1005   PROTEINUR >=300 (A) 12/28/2019 1005   UROBILINOGEN 0.2 03/15/2018 1049   UROBILINOGEN 0.2 12/08/2010 1613   NITRITE POSITIVE (A) 12/28/2019 1005   LEUKOCYTESUR MODERATE (A) 12/28/2019 1005   Sepsis Labs: _0 (procalcitonin:4,lacticidven:4)  ) Recent Results (from the past 240 hour(s))  SARS CORONAVIRUS 2 (TAT 6-24 HRS) Nasopharyngeal Nasopharyngeal Swab     Status: None   Collection Time: 12/28/19  4:20 AM   Specimen: Nasopharyngeal Swab  Result Value Ref Range Status   SARS Coronavirus 2 NEGATIVE NEGATIVE Final    Comment: (NOTE) SARS-CoV-2 target nucleic acids are NOT DETECTED. The SARS-CoV-2 RNA is generally detectable in upper and lower respiratory specimens during the acute phase of infection. Negative results do not preclude SARS-CoV-2 infection, do not rule out co-infections with other pathogens, and should not be used as the sole basis for treatment or other patient management decisions. Negative results must be combined with clinical observations, patient history, and epidemiological information. The expected result is Negative. Fact Sheet for Patients: SugarRoll.be Fact Sheet for Healthcare Providers: https://www.woods-mathews.com/ This test is not yet approved or cleared by the [place] FDA and  has been authorized for detection and/or diagnosis of SARS-CoV-2 by FDA under an Emergency Use Authorization (EUA). This EUA will remain  in effect (meaning this test can be used) for the duration of the COVID-19 declaration under Section 56 4(b)(1) of the Act, 21 U.S.C. section 360bbb-3(b)(1), unless the authorization is terminated or revoked sooner. Performed at [facility], [place] [address], [place], [place] [address]   SARS Coronavirus 2 by RT PCR (hospital order, performed in [facility] hospital  lab) Nasopharyngeal Nasopharyngeal Swab     Status: None   Collection Time: 12/28/19  4:22 AM   Specimen: Nasopharyngeal Swab  Result Value Ref Range Status   SARS Coronavirus 2 NEGATIVE NEGATIVE Final    Comment: (NOTE) SARS-CoV-2 target nucleic acids are NOT DETECTED. The SARS-CoV-2 RNA is generally detectable in upper and lower respiratory specimens during the acute phase of infection. The lowest concentration of SARS-CoV-2 viral copies this assay can detect is 250 copies / mL. A negative result does not preclude SARS-CoV-2 infection and should not be used as the sole basis for treatment or other patient management decisions.  A negative result may occur with improper specimen collection / handling, submission of specimen other than nasopharyngeal swab, presence of viral mutation(s) within the areas targeted by this assay, and inadequate number of viral copies (<250 copies / mL). A negative result must be combined with clinical observations, patient history, and epidemiological information. Fact Sheet for Patients:   StrictlyIdeas.no Fact Sheet for Healthcare Providers: BankingDealers.co.za This test is not yet approved or cleared  by the [place] FDA and has been authorized for detection and/or diagnosis of SARS-CoV-2 by FDA under an Emergency Use Authorization (EUA).  This EUA will remain in effect (meaning this test can be used) for the duration of the COVID-19 declaration under Section 564(b)(1) of the Act, 21 U.S.C. section 360bbb-3(b)(1), unless the authorization is terminated or revoked sooner. Performed at [facility], [place] [address], [place], [place] [address]   Blood Culture (routine x 2)     Status: None   Collection Time: 12/28/19  5:28 AM   Specimen: BLOOD LEFT FOREARM  Result Value Ref Range Status   Specimen Description BLOOD LEFT FOREARM  Final   Special Requests   Final    BOTTLES DRAWN AEROBIC AND  ANAEROBIC  Blood Culture adequate volume   Culture   Final    NO GROWTH 5 DAYS Performed at [facility], [place] [address], [place], [place] [address]    Report Status 01/02/2020 FINAL  Final  Blood Culture (routine x 2)     Status: None   Collection Time: 12/28/19  5:32 AM   Specimen: BLOOD RIGHT FOREARM  Result Value Ref Range Status   Specimen Description BLOOD RIGHT FOREARM  Final   Special Requests   Final    BOTTLES DRAWN AEROBIC AND ANAEROBIC Blood Culture results may not be optimal due to an inadequate volume of blood received in culture bottles   Culture   Final    NO GROWTH 5 DAYS Performed at [facility], [place] [address] [place], [place] [address]    Report Status 01/02/2020 FINAL  Final  Culture, respiratory (non-expectorated)     Status: None   Collection Time: 12/28/19  6:51 PM   Specimen: Tracheal Aspirate; Respiratory  Result Value Ref Range Status   Specimen Description TRACHEAL ASPIRATE  Final   Special Requests NONE  Final   Gram Stain   Final    ABUNDANT WBC PRESENT, PREDOMINANTLY PMN RARE SQUAMOUS EPITHELIAL CELLS PRESENT FEW GRAM POSITIVE COCCI IN PAIRS RARE GRAM NEGATIVE COCCOBACILLI RARE GRAM POSITIVE RODS    Culture   Final    FEW Consistent with normal respiratory flora. Performed at [facility], [place] [address] [place], [place] [address]    Report Status 12/31/2019 FINAL  Final  MRSA PCR Screening     Status: None   Collection Time: 12/28/19  6:57 PM   Specimen: Nasal Mucosa; Nasopharyngeal  Result Value Ref Range Status   MRSA by PCR NEGATIVE NEGATIVE Final    Comment:        The GeneXpert MRSA Assay (FDA approved for NASAL specimens only), is one component of a comprehensive MRSA colonization surveillance program. It is not intended to diagnose MRSA infection nor to guide or monitor treatment for MRSA infections. Performed at [facility], [place] [address] [place], [place] [address]   Novel Coronavirus, NAA (Hosp  order, Send-out to Ref Lab; TAT 18-24 hrs     Status: None   Collection Time: 12/29/19  9:00 AM  Result Value Ref Range Status   SARS-CoV-2, NAA NOT DETECTED NOT DETECTED Final    Comment: (NOTE) This nucleic acid amplification test was developed and its performance characteristics determined by [employer]. Nucleic acid amplification tests include RT-PCR and TMA. This test has not been FDA cleared or approved. This test has been authorized by FDA under an Emergency Use Authorization (EUA). This test is only authorized for the duration of time the declaration that circumstances exist justifying the authorization of the emergency use of in vitro diagnostic tests for detection of SARS-CoV-2 virus and/or diagnosis of COVID-19 infection under section 564(b)(1) of the Act, 21 U.S.C. 124PYK-9(X) (1), unless the authorization is terminated or revoked sooner. When diagnostic testing is negative, the possibility of a false negative result should be considered in the context of a patient's recent exposures and the presence of clinical signs and symptoms consistent with COVID-19. An individual without symptoms of COVID- 19 and who is not shedding SARS-CoV-2  virus would expect to have a negative (not detected) result in this assay. Performed At: [facility] [address] [place], [place] [id number] Rush Farmer MD ZJ:[id number]    [place]  Final  Comment: Performed at [facility], [place] [address] [place], [place] [address]  Body fluid culture     Status: None   Collection Time: 12/30/19  1:02 PM   Specimen: Thoracentesis; Body Fluid  Result Value Ref Range Status   Specimen Description THORACENTESIS  Final   Special Requests NONE  Final   Gram Stain   Final    MODERATE WBC PRESENT,BOTH PMN AND MONONUCLEAR NO ORGANISMS SEEN    Culture   Final    NO GROWTH 3 DAYS Performed at [facility], [address] [place], [place]  [address]    Report Status 01/02/2020 FINAL  Final  Body fluid culture     Status: None   Collection Time: 12/30/19  1:02 PM   Specimen: Thoracentesis; Body Fluid  Result Value Ref Range Status   Specimen Description THORACENTESIS  Final   Special Requests Normal  Final   Gram Stain NO WBC SEEN NO ORGANISMS SEEN   Final   Culture   Final    NO GROWTH 3 DAYS Performed at [facility], [address], [place], [place] [address]    Report Status 01/03/2020 FINAL  Final  Respiratory Panel by PCR     Status: Abnormal   Collection Time: 12/31/19  1:32 PM   Specimen: Nasopharyngeal Swab; Respiratory  Result Value Ref Range Status   Adenovirus NOT DETECTED NOT DETECTED Final   Coronavirus 229E NOT DETECTED NOT DETECTED Final    Comment: (NOTE) The Coronavirus on the Respiratory Panel, DOES NOT test for the novel  Coronavirus (2019 nCoV)    Coronavirus HKU1 NOT DETECTED NOT DETECTED Final   Coronavirus NL63 NOT DETECTED NOT DETECTED Final   Coronavirus OC43 DETECTED (A) NOT DETECTED Final   Metapneumovirus NOT DETECTED NOT DETECTED Final   Rhinovirus / Enterovirus NOT DETECTED NOT DETECTED Final   Influenza A NOT DETECTED NOT DETECTED Final   Influenza B NOT DETECTED NOT DETECTED Final   Parainfluenza Virus 1 NOT DETECTED NOT DETECTED Final   Parainfluenza Virus 2 NOT DETECTED NOT DETECTED Final   Parainfluenza Virus 3 NOT DETECTED NOT DETECTED Final   Parainfluenza Virus 4 NOT DETECTED NOT DETECTED Final   Respiratory Syncytial Virus NOT DETECTED NOT DETECTED Final   Bordetella pertussis NOT DETECTED NOT DETECTED Final   Chlamydophila pneumoniae NOT DETECTED NOT DETECTED Final   Mycoplasma pneumoniae NOT DETECTED NOT DETECTED Final    Comment: Performed at [facility], [place] [address], [place], [place] [address]         Radiology Studies: CARDIAC CATHETERIZATION  Result Date: 01/02/2020  Prox LAD to Dist LAD lesion is 30% stenosed.  Mid Cx to Dist Cx lesion is  60% stenosed.  Mid RCA lesion is 70% stenosed.  There is severe left ventricular systolic dysfunction.  LV end diastolic pressure is mildly elevated.  The left ventricular ejection fraction is 25-35% by visual estimate.  1. Diffuse coronary atherosclerosis with heavy calcification. 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 Severe LV dysfunction EF estimated at 25-30% 3. Elevated EDP 24 mm Hg Plan: aggressive medical therapy for CHF. Will add aldactone to Coreg, lasix, and losartan. Titrate as BP allows. High dose statin therapy. LV dysfunction is out of proportion to the degree of CAD.        Scheduled Meds: . aspirin  81 mg Oral Daily  . atorvastatin  80 mg Oral q1800  . carvedilol  25 mg Oral BID  WC  . chlorhexidine gluconate (MEDLINE KIT)  15 mL Mouth Rinse BID  . Chlorhexidine Gluconate Cloth  6 each Topical Daily  . enoxaparin (LOVENOX) injection  40 mg Subcutaneous Q24H  . feeding supplement (ENSURE ENLIVE)  237 mL Oral TID BM  . insulin aspart  0-9 Units Subcutaneous TID WC  . insulin aspart  3 Units Subcutaneous TID WC  . insulin detemir  18 Units Subcutaneous BID  . lipase/protease/amylase  12,000 Units Oral TID WC  . [START ON 01/05/2020] losartan  100 mg Oral Daily  . nicotine  14 mg Transdermal Daily  . pantoprazole (PROTONIX) IV  40 mg Intravenous QHS  . sodium chloride flush  3 mL Intravenous Q12H  . spironolactone  12.5 mg Oral Daily  . torsemide  20 mg Oral Daily   Continuous Infusions: . sodium chloride 250 mL (12/31/19 1352)  . sodium chloride       LOS: 7 days    Time spent: 27mn  PDomenic Polite MD [facility]  01/04/2020, 11:19 AM

## 2020-01-04 NOTE — Progress Notes (Signed)
Cardiology Progress Note  Patient ID: Rodney Barber MRN: [id number] DOB: 07-15-1966 Date of Encounter: 01/04/2020  Primary Cardiologist: Peter Martinique, MD  Subjective   Chief Complaint: SOB  HPI: Still some SOB with walking. 1 L urine output.   ROS:  All other ROS reviewed and negative. Pertinent positives noted in the HPI.     Inpatient Medications  Scheduled Meds: . aspirin  81 mg Oral Daily  . atorvastatin  80 mg Oral q1800  . carvedilol  25 mg Oral BID WC  . chlorhexidine gluconate (MEDLINE KIT)  15 mL Mouth Rinse BID  . Chlorhexidine Gluconate Cloth  6 each Topical Daily  . enoxaparin (LOVENOX) injection  40 mg Subcutaneous Q24H  . feeding supplement (ENSURE ENLIVE)  237 mL Oral TID BM  . insulin aspart  0-9 Units Subcutaneous TID WC  . insulin aspart  3 Units Subcutaneous TID WC  . insulin detemir  18 Units Subcutaneous BID  . lipase/protease/amylase  12,000 Units Oral TID WC  . losartan  50 mg Oral Daily  . nicotine  14 mg Transdermal Daily  . pantoprazole (PROTONIX) IV  40 mg Intravenous QHS  . sodium chloride flush  3 mL Intravenous Q12H  . spironolactone  12.5 mg Oral Daily   Continuous Infusions: . sodium chloride 250 mL (12/31/19 1352)  . sodium chloride     PRN Meds: sodium chloride, sodium chloride, acetaminophen, dextrose, docusate sodium, ondansetron (ZOFRAN) IV, polyethylene glycol, sodium chloride flush   Vital Signs   Vitals:   01/03/20 2026 01/04/20 0014 01/04/20 0539 01/04/20 0848  BP: 132/68  139/72 (!) 147/80  Pulse: 77  75 77  Resp: 16  16 20   Temp: 98.2 F (36.8 C)  98.5 F (36.9 C) 98.4 F (36.9 C)  TempSrc: Oral  Oral Oral  SpO2: 99%  100% 98%  Weight:  54.3 kg    Height:        Intake/Output Summary (Last 24 hours) at 01/04/2020 0912 Last data filed at 01/04/2020 0600 Gross per 24 hour  Intake 603 ml  Output 925 ml  Net -322 ml   Last 3 Weights 01/04/2020 01/03/2020 01/02/2020  Weight (lbs) 119 lb 9.6 oz 120 lb 11.2 oz 119 lb  14.4 oz  Weight (kg) 54.25 kg 54.749 kg 54.386 kg      Telemetry  Overnight telemetry shows SR 70-80, which I personally reviewed.   Physical Exam   Vitals:   01/03/20 2026 01/04/20 0014 01/04/20 0539 01/04/20 0848  BP: 132/68  139/72 (!) 147/80  Pulse: 77  75 77  Resp: 16  16 20   Temp: 98.2 F (36.8 C)  98.5 F (36.9 C) 98.4 F (36.9 C)  TempSrc: Oral  Oral Oral  SpO2: 99%  100% 98%  Weight:  54.3 kg    Height:         Intake/Output Summary (Last 24 hours) at 01/04/2020 0912 Last data filed at 01/04/2020 0600 Gross per 24 hour  Intake 603 ml  Output 925 ml  Net -322 ml    Last 3 Weights 01/04/2020 01/03/2020 01/02/2020  Weight (lbs) 119 lb 9.6 oz 120 lb 11.2 oz 119 lb 14.4 oz  Weight (kg) 54.25 kg 54.749 kg 54.386 kg    Body mass index is 18.73 kg/m.   General: Well nourished, well developed, in no acute distress Head: Atraumatic, normal size  Eyes: PEERLA, EOMI  Neck: Supple, JVD 7-9 cm H20 Endocrine: No thryomegaly Cardiac: Normal S1, S2; RRR; no murmurs, rubs, or  gallops Lungs: rales bilaterally  Abd: Soft, nontender, no hepatomegaly  Ext: No edema, pulses 2+ Musculoskeletal: No deformities, BUE and BLE strength normal and equal Skin: Warm and dry, no rashes   Neuro: Alert and oriented to person, place, time, and situation, CNII-XII grossly intact, no focal deficits  Psych: Normal mood and affect   Labs  High Sensitivity Troponin:   Recent Labs  Lab 12/28/19 0512 12/28/19 0643  TROPONINIHS 54* 64*     Cardiac EnzymesNo results for input(s): TROPONINI in the last 168 hours. No results for input(s): TROPIPOC in the last 168 hours.  Chemistry Recent Labs  Lab 12/30/19 1206 12/30/19 1636 12/31/19 0559 01/02/20 0401 01/03/20 0514 01/04/20 0625  NA  --   --    < > 142 140 141  K  --   --    < > 3.3* 4.2 3.9  CL  --   --    < > 113* 109 108  CO2  --   --    < > 23 25 27   GLUCOSE  --   --    < > 147* 208* 69*  BUN  --   --    < > 10 12 13   CREATININE   --   --    < > 0.81 0.85 0.86  CALCIUM  --   --    < > 8.0* 8.1* 8.2*  PROT 6.0* 6.2*  --   --   --   --   GFRNONAA  --   --    < > >60 >60 >60  GFRAA  --   --    < > >60 >60 >60  ANIONGAP  --   --    < > 6 6 6    < > = values in this interval not displayed.    Hematology Recent Labs  Lab 01/02/20 0401 01/02/20 2018 01/03/20 0514  WBC 9.0 8.5 8.3  RBC 3.17* 3.32* 3.11*  HGB 9.3* 9.5* 8.9*  HCT 29.6* 30.8* 28.9*  MCV 93.4 92.8 92.9  MCH 29.3 28.6 28.6  MCHC 31.4 30.8 30.8  RDW 14.2 14.2 14.1  PLT 353 352 339   BNPNo results for input(s): BNP, PROBNP in the last 168 hours.  DDimer  Recent Labs  Lab 12/29/19 0041  DDIMER 1.64*     Radiology  CARDIAC CATHETERIZATION  Result Date: 01/02/2020  Prox LAD to Dist LAD lesion is 30% stenosed.  Mid Cx to Dist Cx lesion is 60% stenosed.  Mid RCA lesion is 70% stenosed.  There is severe left ventricular systolic dysfunction.  LV end diastolic pressure is mildly elevated.  The left ventricular ejection fraction is 25-35% by visual estimate.  1. Diffuse coronary atherosclerosis with heavy calcification. 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 Severe LV dysfunction EF estimated at 25-30% 3. Elevated EDP 24 mm Hg Plan: aggressive medical therapy for CHF. Will add aldactone to Coreg, lasix, and losartan. Titrate as BP allows. High dose statin therapy. LV dysfunction is out of proportion to the degree of CAD.    Cardiac Studies  LHC 01/02/2020  Prox LAD to Dist LAD lesion is 30% stenosed.  Mid Cx to Dist Cx lesion is 60% stenosed.  Mid RCA lesion is 70% stenosed.  There is severe left ventricular systolic dysfunction.  LV end diastolic pressure is mildly elevated.  The left ventricular ejection fraction is 25-35% by visual estimate.   1. Diffuse coronary atherosclerosis with heavy calcification. Modest single  vessel obstructive CAD involving the mid RCA 2. Severe LV dysfunction EF estimated at 25-30% 3.  Elevated EDP 24 mm Hg  Plan: aggressive medical therapy for CHF. Will add aldactone to Coreg, lasix, and losartan. Titrate as BP allows. High dose statin therapy. LV dysfunction is out of proportion to the degree of CAD.  TTE 12/28/2019 1. Left ventricular ejection fraction, by estimation, is 35 to 40%. The  left ventricle has moderately decreased function. The left ventricle  demonstrates global hypokinesis. There is moderate concentric left  ventricular hypertrophy. Left ventricular  diastolic function could not be evaluated.  2. Right ventricular systolic function is normal. The right ventricular  size is normal. There is normal pulmonary artery systolic pressure.  3. The mitral valve is normal in structure. Trivial mitral valve  regurgitation.  4. The aortic valve is tricuspid. Aortic valve regurgitation is not  visualized.  5. The inferior vena cava is dilated in size with >50% respiratory  variability, suggesting right atrial pressure of 8 mmHg.   Patient Profile  Rodney Barber is a 54 y.o. male with DM, HTN, tobacco abuse, etoh pancreatitis, admitted 12/28/2019 for hypoxic respiratory failure 2/2 CHF.   Assessment & Plan   1. Acute on Chronic CHF, 35-40% -EF change down to 35% from 45%. Had a positive NM stress. LHC with non-obstructive CAD and EF out of proportion to CAD. Felt to be non-ischemic.  -continue coreg 12.5 mg BID, aldactone 12.5 mg QD. Increase losartan to 100 mg QD -will start torsemide 20 mg daily  -anticipate DC tomorrow   2. CAD -non-obstructive CAD -ASA.statin   Will need follow-up with Dr. Martinique.   For questions or updates, please contact [place] Please consult [email or web address] for contact info under   Time Spent with Patient: I have spent a total of 25 minutes with patient reviewing hospital notes, telemetry, EKGs, labs and examining the patient as well as establishing an assessment and plan that was discussed with the patient.  > 50% of  time was spent in direct patient care.    Signed, Addison Naegeli. Audie Box, [place]  01/04/2020 9:12 AM

## 2020-01-04 NOTE — Plan of Care (Signed)
  Problem: Clinical Measurements: Goal: Will remain free from infection Outcome: Completed/Met Goal: Respiratory complications will improve Outcome: Completed/Met   Problem: Elimination: Goal: Will not experience complications related to bowel motility Outcome: Completed/Met

## 2020-01-04 NOTE — Progress Notes (Signed)
Another consult was placed to IV Therapy to restart another IV;  Pt has pulled out several on this admission;  Pt is not receiving any iv meds at this time; has eaten his entire breakfast;  Have spoken with the RN;  Will hold another iv restart at this time, and explained that if the pt needs access, a new iv will be placed at that time.  Thank you.

## 2020-01-04 NOTE — TOC Benefit Eligibility Note (Signed)
Transition of Care [facility]) Benefit Eligibility Note    Patient Details  Name: Finnian Husted MRN: [id number] Date of Birth: 11-Nov-1965   Insurance- none   Entresto cost ~$650-700 month  Jardiance cost ~$650-700 month   Patient can be provided with 30 day coupon for cost of Entresto to be brought down to $0. This will supply 30 days for free, patients are only able to use one coupon in a lifetime.  London Pepper has a 14 day free coupon.   Refills would need to be obtained through [facility], patient has appointment 6/15. Cost of refills could still be cost prohibitive, and depend on patient compliance and ability to get to appointments.                                  Lawerance Sabal, RN Phone Number: 01/04/2020, 7:56 AM

## 2020-01-04 NOTE — Plan of Care (Signed)
  Problem: Activity: Goal: Risk for activity intolerance will decrease Outcome: Completed/Met   Problem: Nutrition: Goal: Adequate nutrition will be maintained Outcome: Completed/Met   Problem: Coping: Goal: Level of anxiety will decrease Outcome: Completed/Met   Problem: Pain Managment: Goal: General experience of comfort will improve Outcome: Completed/Met   Problem: Safety: Goal: Ability to remain free from injury will improve Outcome: Completed/Met   Problem: Skin Integrity: Goal: Risk for impaired skin integrity will decrease Outcome: Completed/Met   

## 2020-01-04 NOTE — Progress Notes (Signed)
Pt's CBG 45 this am, giving OJ and graham crackers and PB no S/S will recheck shortly, thanks Glenna Fellows.

## 2020-01-04 NOTE — Plan of Care (Signed)
  Problem: Health Behavior/Discharge Planning: Goal: Ability to manage health-related needs will improve Outcome: Progressing   Problem: Clinical Measurements: Goal: Ability to maintain clinical measurements within normal limits will improve Outcome: Progressing Goal: Will remain free from infection Outcome: Progressing Goal: Diagnostic test results will improve Outcome: Progressing Goal: Respiratory complications will improve Outcome: Progressing Goal: Cardiovascular complication will be avoided Outcome: Progressing   Problem: Elimination: Goal: Will not experience complications related to bowel motility Outcome: Progressing Goal: Will not experience complications related to urinary retention Outcome: Progressing   Problem: Education: Goal: Ability to demonstrate management of disease process will improve Outcome: Progressing Goal: Ability to verbalize understanding of medication therapies will improve Outcome: Progressing Goal: Individualized Educational Video(s) Outcome: Progressing   Problem: Activity: Goal: Capacity to carry out activities will improve Outcome: Progressing   Problem: Cardiac: Goal: Ability to achieve and maintain adequate cardiopulmonary perfusion will improve Outcome: Progressing

## 2020-01-05 ENCOUNTER — Encounter (HOSPITAL_COMMUNITY): Payer: Self-pay | Admitting: Emergency Medicine

## 2020-01-05 LAB — GLUCOSE, CAPILLARY
Glucose-Capillary: 120 mg/dL — ABNORMAL HIGH (ref 70–99)
Glucose-Capillary: 155 mg/dL — ABNORMAL HIGH (ref 70–99)
Glucose-Capillary: 195 mg/dL — ABNORMAL HIGH (ref 70–99)
Glucose-Capillary: 216 mg/dL — ABNORMAL HIGH (ref 70–99)
Glucose-Capillary: 23 mg/dL — CL (ref 70–99)
Glucose-Capillary: 26 mg/dL — CL (ref 70–99)
Glucose-Capillary: 287 mg/dL — ABNORMAL HIGH (ref 70–99)
Glucose-Capillary: 336 mg/dL — ABNORMAL HIGH (ref 70–99)

## 2020-01-05 LAB — BASIC METABOLIC PANEL
Anion gap: 6 (ref 5–15)
BUN: 12 mg/dL (ref 6–20)
CO2: 29 mmol/L (ref 22–32)
Calcium: 8.1 mg/dL — ABNORMAL LOW (ref 8.9–10.3)
Chloride: 107 mmol/L (ref 98–111)
Creatinine, Ser: 0.95 mg/dL (ref 0.61–1.24)
GFR calc Af Amer: >60 mL/min (ref >60)
GFR calc non Af Amer: >60 mL/min (ref >60)
Glucose, Bld: 28 mg/dL — CL (ref 70–99)
Potassium: 3.7 mmol/L (ref 3.5–5.1)
Sodium: 142 mmol/L (ref 135–145)

## 2020-01-05 MED ORDER — INSULIN DETEMIR 100 UNIT/ML ~~LOC~~ SOLN
13.0000 [IU] | Freq: Two times a day (BID) | SUBCUTANEOUS | Status: DC
  Filled 2020-01-05 (×2): qty 0.13

## 2020-01-05 MED ORDER — INSULIN ASPART 100 UNIT/ML ~~LOC~~ SOLN
2.0000 [IU] | Freq: Three times a day (TID) | SUBCUTANEOUS | Status: DC
  Administered 2020-01-05 – 2020-01-06 (×4): 2 [IU] via SUBCUTANEOUS

## 2020-01-05 MED ORDER — INSULIN DETEMIR 100 UNIT/ML ~~LOC~~ SOLN
10.0000 [IU] | Freq: Two times a day (BID) | SUBCUTANEOUS | Status: DC
  Administered 2020-01-05 – 2020-01-06 (×2): 10 [IU] via SUBCUTANEOUS
  Filled 2020-01-05 (×4): qty 0.1

## 2020-01-05 NOTE — Progress Notes (Signed)
Pt's CBG is now 120, will continue to monitor, Thanks Glenna Fellows.

## 2020-01-05 NOTE — Progress Notes (Signed)
Physical Therapy Treatment Patient Details Name: Rodney Barber MRN: [id number] DOB: 1965-10-28 Today's Date: 01/05/2020    History of Present Illness 54 yo M PMH alcoholic pancreatitis, htn, DM2 poorly controlled, homelessness, presented to [facility] 5/22 with CC SOB, cough. In ED, pt hypoxic, SpO2 70s on RA. Initially, placed on NRB with SpO2 improvement to 90%. Progressive resp distress and hypoxia was intubated in the emergency room.  Extubated 5/25.     PT Comments    Pt admitted with above diagnosis. Pt was able to ambulate with rollator for ultimate safety.  Pt wants to use cane and PT agrees if pt is very careful he is safe with cane but safest with rollator. Has progressed well and should be able to return to homeless shelter and bypass rehab.  Updated the rehab coordinator.  Will follow while pt in hospital.   Pt currently with functional limitations due to balance and endurance deficits. Pt will benefit from skilled PT to increase their independence and safety with mobility to allow discharge to the venue listed below.     Follow Up Recommendations  No PT follow up;Supervision - Intermittent     Equipment Recommendations  Other (comment);Cane(pt wants cane and reports he has a rollator already)    Recommendations for Other Services       Precautions / Restrictions Precautions Precautions: Fall Restrictions Weight Bearing Restrictions: No    Mobility  Bed Mobility Overal bed mobility: Modified Independent Bed Mobility: Supine to Sit;Sit to Supine     Supine to sit: Modified independent (Device/Increase time) Sit to supine: Modified independent (Device/Increase time)      Transfers Overall transfer level: Modified independent Equipment used: Rolling walker (2 wheeled) Transfers: Sit to/from Stand Sit to Stand: Supervision         General transfer comment: Able to manuever all aroud room without use of AD or reaching out for environment to  brace  Ambulation/Gait Ambulation/Gait assistance: Min guard Gait Distance (Feet): 400 Feet Assistive device: Rolling walker (2 wheeled);Straight cane Gait Pattern/deviations: Step-through pattern;Decreased stride length Gait velocity: reduced Gait velocity interpretation: <1.8 ft/sec, indicate of risk for recurrent falls General Gait Details: Pt does best with RW and states he has a rollator that he can use.  He understands that PT recommnends to use rollator for ultimate safety. Pt does ask if he can use cane because it is less bulky.  Feel that pt is safe with cane but safer with rollator and pt is aware of PT recommendation.    Stairs             Wheelchair Mobility    Modified Rankin (Stroke Patients Only)       Balance Overall balance assessment: Independent Sitting-balance support: Feet supported;No upper extremity supported Sitting balance-Leahy Scale: Good     Standing balance support: No upper extremity supported;During functional activity;Single extremity supported Standing balance-Leahy Scale: Fair                   Standardized Balance Assessment Standardized Balance Assessment : Dynamic Gait Index   Dynamic Gait Index Level Surface: Normal Change in Gait Speed: Mild Impairment Gait with Horizontal Head Turns: Mild Impairment Gait with Vertical Head Turns: Mild Impairment Gait and Pivot Turn: Mild Impairment Step Over Obstacle: Mild Impairment Step Around Obstacles: Normal Steps: Mild Impairment Total Score: 18      Cognition Arousal/Alertness: Awake/alert Behavior During Therapy: WFL for tasks assessed/performed Overall Cognitive Status: Within Functional Limits for tasks assessed Area of Impairment: Safety/judgement;Following  commands                       Following Commands: Follows one step commands with increased time Safety/Judgement: Decreased awareness of safety;Decreased awareness of deficits            Exercises       General Comments General comments (skin integrity, edema, etc.): Still scored 18/24 as pt still needs device and does lose balance if challenged.       Pertinent Vitals/Pain Pain Assessment: No/denies pain    Home Living                      Prior Function            PT Goals (current goals can now be found in the care plan section) Acute Rehab PT Goals Patient Stated Goal: return to independence Progress towards PT goals: Progressing toward goals    Frequency    Min 3X/week      PT Plan Discharge plan needs to be updated    Co-evaluation              AM-PAC PT "6 Clicks" Mobility   Outcome Measure  Help needed turning from your back to your side while in a flat bed without using bedrails?: None Help needed moving from lying on your back to sitting on the side of a flat bed without using bedrails?: None Help needed moving to and from a bed to a chair (including a wheelchair)?: A Little Help needed standing up from a chair using your arms (e.g., wheelchair or bedside chair)?: A Little Help needed to walk in hospital room?: A Little Help needed climbing 3-5 steps with a railing? : A Little 6 Click Score: 20    End of Session Equipment Utilized During Treatment: Gait belt Activity Tolerance: Patient limited by fatigue Patient left: in bed;with call bell/phone within reach Nurse Communication: Mobility status PT Visit Diagnosis: Unsteadiness on feet (R26.81);Muscle weakness (generalized) (M62.81)     Time: 4540-9811 PT Time Calculation (min) (ACUTE ONLY): 12 min  Charges:  $Gait Training: 8-22 mins                     Lenita Peregrina W,PT Acute Rehabilitation Services Pager:  [phone]  Office:  [phone]     Berline Lopes 01/05/2020, 1:08 PM

## 2020-01-05 NOTE — Progress Notes (Signed)
Cardiology Progress Note  Patient ID: Rodney Barber MRN: [id number] DOB: 12/03/1965 Date of Encounter: 01/05/2020  Primary Cardiologist: Peter Jordan, MD  Subjective   Chief Complaint: SOB  HPI: doing well with ambulation. Minimal SOB  ROS:  All other ROS reviewed and negative. Pertinent positives noted in the HPI.     Inpatient Medications  Scheduled Meds: . aspirin  81 mg Oral Daily  . atorvastatin  80 mg Oral q1800  . carvedilol  25 mg Oral BID WC  . chlorhexidine gluconate (MEDLINE KIT)  15 mL Mouth Rinse BID  . Chlorhexidine Gluconate Cloth  6 each Topical Daily  . enoxaparin (LOVENOX) injection  40 mg Subcutaneous Q24H  . feeding supplement (ENSURE ENLIVE)  237 mL Oral TID BM  . insulin aspart  0-9 Units Subcutaneous TID WC  . insulin aspart  2 Units Subcutaneous TID WC  . insulin detemir  10 Units Subcutaneous BID  . lipase/protease/amylase  12,000 Units Oral TID WC  . losartan  100 mg Oral Daily  . nicotine  14 mg Transdermal Daily  . pantoprazole  40 mg Oral Daily  . sodium chloride flush  3 mL Intravenous Q12H  . spironolactone  12.5 mg Oral Daily  . torsemide  20 mg Oral Daily   Continuous Infusions: . sodium chloride 250 mL (12/31/19 1352)  . sodium chloride     PRN Meds: sodium chloride, sodium chloride, acetaminophen, dextrose, docusate sodium, ondansetron (ZOFRAN) IV, polyethylene glycol, sodium chloride flush   Vital Signs   Vitals:   01/04/20 0848 01/04/20 1249 01/04/20 2011 01/05/20 0417  BP: (!) 147/80 138/76 129/74 (!) 155/82  Pulse: 77 71 75 73  Resp: 20 20 20 18  Temp: 98.4 F (36.9 C) 98 F (36.7 C) 98.7 F (37.1 C) 98.4 F (36.9 C)  TempSrc: Oral Oral Oral Oral  SpO2: 98% 100% 96% 100%  Weight:    53.3 kg  Height:        Intake/Output Summary (Last 24 hours) at 01/05/2020 0740 Last data filed at 01/05/2020 0600 Gross per 24 hour  Intake 1440 ml  Output 1701 ml  Net -261 ml   Last 3 Weights 01/05/2020 01/04/2020 01/03/2020    Weight (lbs) 117 lb 9.6 oz 119 lb 9.6 oz 120 lb 11.2 oz  Weight (kg) 53.343 kg 54.25 kg 54.749 kg      Telemetry  Overnight telemetry shows SR 70-80, which I personally reviewed.   Physical Exam   Vitals:   01/04/20 0848 01/04/20 1249 01/04/20 2011 01/05/20 0417  BP: (!) 147/80 138/76 129/74 (!) 155/82  Pulse: 77 71 75 73  Resp: 20 20 20 18  Temp: 98.4 F (36.9 C) 98 F (36.7 C) 98.7 F (37.1 C) 98.4 F (36.9 C)  TempSrc: Oral Oral Oral Oral  SpO2: 98% 100% 96% 100%  Weight:    53.3 kg  Height:         Intake/Output Summary (Last 24 hours) at 01/05/2020 0740 Last data filed at 01/05/2020 0600 Gross per 24 hour  Intake 1440 ml  Output 1701 ml  Net -261 ml    Last 3 Weights 01/05/2020 01/04/2020 01/03/2020  Weight (lbs) 117 lb 9.6 oz 119 lb 9.6 oz 120 lb 11.2 oz  Weight (kg) 53.343 kg 54.25 kg 54.749 kg    Body mass index is 18.42 kg/m.   General: Well nourished, thin, in no acute distress Head: Atraumatic, normal size  Eyes: PEERLA, EOMI  Neck: Supple, JVD 6 cm H20 Endocrine: No   Cardiology Progress Note  Patient ID: Rodney Barber MRN: [id number] DOB: July 22, 1966 Date of Encounter: 01/05/2020  Primary Cardiologist: Peter Martinique, MD  Subjective   Chief Complaint: SOB  HPI: doing well with ambulation. Minimal SOB  ROS:  All other ROS reviewed and negative. Pertinent positives noted in the HPI.     Inpatient Medications  Scheduled Meds: . aspirin  81 mg Oral Daily  . atorvastatin  80 mg Oral q1800  . carvedilol  25 mg Oral BID WC  . chlorhexidine gluconate (MEDLINE KIT)  15 mL Mouth Rinse BID  . Chlorhexidine Gluconate Cloth  6 each Topical Daily  . enoxaparin (LOVENOX) injection  40 mg Subcutaneous Q24H  . feeding supplement (ENSURE ENLIVE)  237 mL Oral TID BM  . insulin aspart  0-9 Units Subcutaneous TID WC  . insulin aspart  2 Units Subcutaneous TID WC  . insulin detemir  10 Units Subcutaneous BID  . lipase/protease/amylase  12,000 Units Oral TID WC  . losartan  100 mg Oral Daily  . nicotine  14 mg Transdermal Daily  . pantoprazole  40 mg Oral Daily  . sodium chloride flush  3 mL Intravenous Q12H  . spironolactone  12.5 mg Oral Daily  . torsemide  20 mg Oral Daily   Continuous Infusions: . sodium chloride 250 mL (12/31/19 1352)  . sodium chloride     PRN Meds: sodium chloride, sodium chloride, acetaminophen, dextrose, docusate sodium, ondansetron (ZOFRAN) IV, polyethylene glycol, sodium chloride flush   Vital Signs   Vitals:   01/04/20 0848 01/04/20 1249 01/04/20 2011 01/05/20 0417  BP: (!) 147/80 138/76 129/74 (!) 155/82  Pulse: 77 71 75 73  Resp: _0 Temp: 98.4 F (36.9 C) 98 F (36.7 C) 98.7 F (37.1 C) 98.4 F (36.9 C)  TempSrc: Oral Oral Oral Oral  SpO2: 98% 100% 96% 100%  Weight:    53.3 kg  Height:        Intake/Output Summary (Last 24 hours) at 01/05/2020 0740 Last data filed at 01/05/2020 0600 Gross per 24 hour  Intake 1440 ml  Output 1701 ml  Net -261 ml   Last 3 Weights 01/05/2020 01/04/2020 01/03/2020    Weight (lbs) 117 lb 9.6 oz 119 lb 9.6 oz 120 lb 11.2 oz  Weight (kg) 53.343 kg 54.25 kg 54.749 kg      Telemetry  Overnight telemetry shows SR 70-80, which I personally reviewed.   Physical Exam   Vitals:   01/04/20 0848 01/04/20 1249 01/04/20 2011 01/05/20 0417  BP: (!) 147/80 138/76 129/74 (!) 155/82  Pulse: 77 71 75 73  Resp: _1 Temp: 98.4 F (36.9 C) 98 F (36.7 C) 98.7 F (37.1 C) 98.4 F (36.9 C)  TempSrc: Oral Oral Oral Oral  SpO2: 98% 100% 96% 100%  Weight:    53.3 kg  Height:         Intake/Output Summary (Last 24 hours) at 01/05/2020 0740 Last data filed at 01/05/2020 0600 Gross per 24 hour  Intake 1440 ml  Output 1701 ml  Net -261 ml    Last 3 Weights 01/05/2020 01/04/2020 01/03/2020  Weight (lbs) 117 lb 9.6 oz 119 lb 9.6 oz 120 lb 11.2 oz  Weight (kg) 53.343 kg 54.25 kg 54.749 kg    Body mass index is 18.42 kg/m.   General: Well nourished, thin, in no acute distress Head: Atraumatic, normal size  Eyes: PEERLA, EOMI  Neck: Supple, JVD 6 cm H20 Endocrine: No  Cardiology Progress Note  Patient ID: Rodney Barber MRN: [id number] DOB: 12/03/1965 Date of Encounter: 01/05/2020  Primary Cardiologist:  , MD  Subjective   Chief Complaint: SOB  HPI: doing well with ambulation. Minimal SOB  ROS:  All other ROS reviewed and negative. Pertinent positives noted in the HPI.     Inpatient Medications  Scheduled Meds: . aspirin  81 mg Oral Daily  . atorvastatin  80 mg Oral q1800  . carvedilol  25 mg Oral BID WC  . chlorhexidine gluconate (MEDLINE KIT)  15 mL Mouth Rinse BID  . Chlorhexidine Gluconate Cloth  6 each Topical Daily  . enoxaparin (LOVENOX) injection  40 mg Subcutaneous Q24H  . feeding supplement (ENSURE ENLIVE)  237 mL Oral TID BM  . insulin aspart  0-9 Units Subcutaneous TID WC  . insulin aspart  2 Units Subcutaneous TID WC  . insulin detemir  10 Units Subcutaneous BID  . lipase/protease/amylase  12,000 Units Oral TID WC  . losartan  100 mg Oral Daily  . nicotine  14 mg Transdermal Daily  . pantoprazole  40 mg Oral Daily  . sodium chloride flush  3 mL Intravenous Q12H  . spironolactone  12.5 mg Oral Daily  . torsemide  20 mg Oral Daily   Continuous Infusions: . sodium chloride 250 mL (12/31/19 1352)  . sodium chloride     PRN Meds: sodium chloride, sodium chloride, acetaminophen, dextrose, docusate sodium, ondansetron (ZOFRAN) IV, polyethylene glycol, sodium chloride flush   Vital Signs   Vitals:   01/04/20 0848 01/04/20 1249 01/04/20 2011 01/05/20 0417  BP: (!) 147/80 138/76 129/74 (!) 155/82  Pulse: 77 71 75 73  Resp: 20 20 20 18  Temp: 98.4 F (36.9 C) 98 F (36.7 C) 98.7 F (37.1 C) 98.4 F (36.9 C)  TempSrc: Oral Oral Oral Oral  SpO2: 98% 100% 96% 100%  Weight:    53.3 kg  Height:        Intake/Output Summary (Last 24 hours) at 01/05/2020 0740 Last data filed at 01/05/2020 0600 Gross per 24 hour  Intake 1440 ml  Output 1701 ml  Net -261 ml   Last 3 Weights 01/05/2020 01/04/2020 01/03/2020    Weight (lbs) 117 lb 9.6 oz 119 lb 9.6 oz 120 lb 11.2 oz  Weight (kg) 53.343 kg 54.25 kg 54.749 kg      Telemetry  Overnight telemetry shows SR 70-80, which I personally reviewed.   Physical Exam   Vitals:   01/04/20 0848 01/04/20 1249 01/04/20 2011 01/05/20 0417  BP: (!) 147/80 138/76 129/74 (!) 155/82  Pulse: 77 71 75 73  Resp: 20 20 20 18  Temp: 98.4 F (36.9 C) 98 F (36.7 C) 98.7 F (37.1 C) 98.4 F (36.9 C)  TempSrc: Oral Oral Oral Oral  SpO2: 98% 100% 96% 100%  Weight:    53.3 kg  Height:         Intake/Output Summary (Last 24 hours) at 01/05/2020 0740 Last data filed at 01/05/2020 0600 Gross per 24 hour  Intake 1440 ml  Output 1701 ml  Net -261 ml    Last 3 Weights 01/05/2020 01/04/2020 01/03/2020  Weight (lbs) 117 lb 9.6 oz 119 lb 9.6 oz 120 lb 11.2 oz  Weight (kg) 53.343 kg 54.25 kg 54.749 kg    Body mass index is 18.42 kg/m.   General: Well nourished, thin, in no acute distress Head: Atraumatic, normal size  Eyes: PEERLA, EOMI  Neck: Supple, JVD 6 cm H20 Endocrine: No

## 2020-01-05 NOTE — Progress Notes (Signed)
Pt's lab glucose was 28, no s/s  MD notified, pt given D50 and carb snack, will continue to monitor, Thanks Lavonda Jumbo RN.

## 2020-01-05 NOTE — Progress Notes (Signed)
Inpatient Rehabilitation-Admissions Coordinator   Noted continued progress with therapy this weekend, with OT reporting pt supervision level for transfers and supervision/Mod I for ADLs. Per conversation with PT this AM, pt no longer needs CIR level therapies for mobility either. During bedside meeting with pt this AM, he feels he is able to DC back to his shelter as he feels steady on his feet. He does mention he would like a cane. AC will sign off and will contact TOC team.   Cheri Rous, OTR/L  Rehab Admissions Coordinator  [phone] 01/05/2020 12:10 PM

## 2020-01-05 NOTE — Progress Notes (Signed)
CBG 23, this am, yesterday was 45, pt is not having any s/s, and I have checked 3x, gave 24ml of D50 and graham crackers and PB to help bring it up, will recheck shortly, will continue to monitor, Thanks Lavonda Jumbo RN.

## 2020-01-05 NOTE — Progress Notes (Signed)
PROGRESS NOTE    Rodney Barber  NTZ:[id number] DOB: 1966-07-20 DOA: 12/28/2019 PCP: Elsie Stain, MD  Brief Narrative: 54 yo homeless male with history of alcoholic pancreatitis, htn, DM2 poorly controlled, presented to [facility] 5/22 with CC SOB, cough. In ED, pt hypoxic, SpO2 70s on RA. Initially, placed on NRB , progressive resp distress and hypoxia was intubated in the emergency room. -Diuresed with IV Lasix, extubated on 5/25 -Transferred to [facility] service 5/26 -Further work-up noted echo with EF of 35%, underwent cardiac cath which showed 70% RCA disease otherwise nonobstructive CAD, medical management was recommended -Heart failure meds titrated  Assessment & Plan:   Acute hypoxemic respiratory failure -Acute systolic CHF, EF of 67% lower from prior -Extubated 5/25 -Status post bilateral thoracentesis 5/25, fluid is transudative -Appreciate cardiology input -Diuresing well with IV Lasix, transition to oral Lasix yesterday -Continue Coreg, ARB, statin, aspirin -Abnormal Myoview, left heart catheterization-noted 70% RCA disease and otherwise nonobstructive CAD, medical management was recommended -Ambulate, increase activity as tolerated, discharge planning, hold discharge today given severe hypoglycemic event this morning  Chronic acoholic pancreatitis   -Denies active ongoing alcoholism -Thiamine   COPD/heavy tobacco abuse -Smoking 3 packs/day, counseled -Nebs PRN  DM2  brittle -Very brittle diabetic, hyperglycemic again this morning despite lowering dose of Levemir, prior to that blood sugars were in the 4-500 range, CBGs in the 20s this morning, lower p.m. dose of Levemir -continue meal coverage  -Monitor 1 more day inpatient  Homelessness  Chronic anemia -Stable  H/o HTN -BP stable, monitor  DVT prophylaxis: Lovenox Code Status: Full code Family Communication: Discussed patient , no family at bedside Disposition Plan:  Status is: Inpatient  Remains inpatient  appropriate because:Ongoing diagnostic testing needed not appropriate for outpatient work up severe hypoglycemic episode this morning, hopefully back to shelter tomorrow  Dispo: The patient is from: Homeless              Anticipated d/c is to: Likely back to the shelter              Anticipated d/c date is: 6/1              Patient currently is not medically stable to d/c.   Consultants:   Cardiology  PCCM   Procedures:   Antimicrobials:    Subjective: Overall improving,  blood sugars in the 20s this morning, less short of breath  Objective: Vitals:   01/04/20 1249 01/04/20 2011 01/05/20 0417 01/05/20 0939  BP: 138/76 129/74 (!) 155/82 (!) 146/79  Pulse: 71 75 73 72  Resp: 20 20 18 16   Temp: 98 F (36.7 C) 98.7 F (37.1 C) 98.4 F (36.9 C) 98.3 F (36.8 C)  TempSrc: Oral Oral Oral Oral  SpO2: 100% 96% 100% 100%  Weight:   53.3 kg   Height:        Intake/Output Summary (Last 24 hours) at 01/05/2020 1100 Last data filed at 01/05/2020 0900 Gross per 24 hour  Intake 1440 ml  Output 1401 ml  Net 39 ml   Filed Weights   01/03/20 0325 01/04/20 0014 01/05/20 0417  Weight: 54.7 kg 54.3 kg 53.3 kg    Examination:  General: Thinly built pleasant male sitting up in bed, AAOx3, no distress HEENT: No JVD CVS S1-S2 regular rate rhythm Lungs with rare basilar rales otherwise clear Abdomen is soft, nontender, positive bowel sounds Extremities with no edema Skin with no rashes on exposed skin Psychiatry: Poor insight and judgment   Data Reviewed:  CBC: Recent Labs  Lab 12/31/19 0559 01/01/20 0550 01/02/20 0401 01/02/20 2018 01/03/20 0514  WBC 10.4 8.8 9.0 8.5 8.3  NEUTROABS 7.6 6.2 5.5  --  4.9  HGB 10.1* 9.2* 9.3* 9.5* 8.9*  HCT 32.2* 29.9* 29.6* 30.8* 28.9*  MCV 92.5 93.4 93.4 92.8 92.9  PLT 325 324 353 352 616   Basic Metabolic Panel: Recent Labs  Lab 12/29/19 1650 12/30/19 1125 12/30/19 1636 12/31/19 0559 01/01/20 0550 01/02/20 0401 01/03/20  0514 01/04/20 0625 01/05/20 0455  NA  --  142  --    < > 143 142 140 141 142  K  --  3.5  --    < > 4.1 3.3* 4.2 3.9 3.7  CL  --  114*  --    < > 114* 113* 109 108 107  CO2  --  17*  --    < > 22 23 25 27 29   GLUCOSE  --  130*  --    < > 515* 147* 208* 69* 28*  BUN  --  31*  --    < > 17 10 12 13 12   CREATININE  --  1.18  --    < > 1.18 0.81 0.85 0.86 0.95  CALCIUM  --  7.8*  --    < > 8.2* 8.0* 8.1* 8.2* 8.1*  MG 1.8 1.8 2.3  --   --   --   --   --   --   PHOS 4.2 4.6 5.0*  --   --   --   --   --   --    < > = values in this interval not displayed.   GFR: Estimated Creatinine Clearance: 66.2 mL/min (by C-G formula based on SCr of 0.95 mg/dL). Liver Function Tests: Recent Labs  Lab 12/30/19 1206 12/30/19 1636  PROT 6.0* 6.2*   No results for input(s): LIPASE, AMYLASE in the last 168 hours. No results for input(s): AMMONIA in the last 168 hours. Coagulation Profile: No results for input(s): INR, PROTIME in the last 168 hours. Cardiac Enzymes: No results for input(s): CKTOTAL, CKMB, CKMBINDEX, TROPONINI in the last 168 hours. BNP (last 3 results) No results for input(s): PROBNP in the last 8760 hours. HbA1C: No results for input(s): HGBA1C in the last 72 hours. CBG: Recent Labs  Lab 01/04/20 2117 01/05/20 0045 01/05/20 0525 01/05/20 0541 01/05/20 0626  GLUCAP 160* 155* 26* 23* 120*   Lipid Profile: No results for input(s): CHOL, HDL, LDLCALC, TRIG, CHOLHDL, LDLDIRECT in the last 72 hours. Thyroid Function Tests: No results for input(s): TSH, T4TOTAL, FREET4, T3FREE, THYROIDAB in the last 72 hours. Anemia Panel: No results for input(s): VITAMINB12, FOLATE, FERRITIN, TIBC, IRON, RETICCTPCT in the last 72 hours. Urine analysis:    Component Value Date/Time   COLORURINE YELLOW 12/28/2019 1005   APPEARANCEUR HAZY (A) 12/28/2019 1005   LABSPEC 1.021 12/28/2019 1005   PHURINE 5.0 12/28/2019 1005   GLUCOSEU >=500 (A) 12/28/2019 1005   HGBUR LARGE (A) 12/28/2019 1005    BILIRUBINUR NEGATIVE 12/28/2019 1005   BILIRUBINUR negative 03/15/2018 1049   BILIRUBINUR negative 12/26/2017 0941   KETONESUR 5 (A) 12/28/2019 1005   PROTEINUR >=300 (A) 12/28/2019 1005   UROBILINOGEN 0.2 03/15/2018 1049   UROBILINOGEN 0.2 12/08/2010 1613   NITRITE POSITIVE (A) 12/28/2019 1005   LEUKOCYTESUR MODERATE (A) 12/28/2019 1005   Sepsis Labs: @LABRCNTIP (procalcitonin:4,lacticidven:4)  ) Recent Results (from the past 240 hour(s))  SARS CORONAVIRUS 2 (TAT 6-24 HRS) Nasopharyngeal Nasopharyngeal  Swab     Status: None   Collection Time: 12/28/19  4:20 AM   Specimen: Nasopharyngeal Swab  Result Value Ref Range Status   SARS Coronavirus 2 NEGATIVE NEGATIVE Final    Comment: (NOTE) SARS-CoV-2 target nucleic acids are NOT DETECTED. The SARS-CoV-2 RNA is generally detectable in upper and lower respiratory specimens during the acute phase of infection. Negative results do not preclude SARS-CoV-2 infection, do not rule out co-infections with other pathogens, and should not be used as the sole basis for treatment or other patient management decisions. Negative results must be combined with clinical observations, patient history, and epidemiological information. The expected result is Negative. Fact Sheet for Patients: SugarRoll.be Fact Sheet for Healthcare Providers: https://www.woods-mathews.com/ This test is not yet approved or cleared by the [place] FDA and  has been authorized for detection and/or diagnosis of SARS-CoV-2 by FDA under an Emergency Use Authorization (EUA). This EUA will remain  in effect (meaning this test can be used) for the duration of the COVID-19 declaration under Section 56 4(b)(1) of the Act, 21 U.S.C. section 360bbb-3(b)(1), unless the authorization is terminated or revoked sooner. Performed at [facility], [place] [address], [place], [place] [address]   SARS Coronavirus 2 by RT PCR (hospital  order, performed in [facility] hospital lab) Nasopharyngeal Nasopharyngeal Swab     Status: None   Collection Time: 12/28/19  4:22 AM   Specimen: Nasopharyngeal Swab  Result Value Ref Range Status   SARS Coronavirus 2 NEGATIVE NEGATIVE Final    Comment: (NOTE) SARS-CoV-2 target nucleic acids are NOT DETECTED. The SARS-CoV-2 RNA is generally detectable in upper and lower respiratory specimens during the acute phase of infection. The lowest concentration of SARS-CoV-2 viral copies this assay can detect is 250 copies / mL. A negative result does not preclude SARS-CoV-2 infection and should not be used as the sole basis for treatment or other patient management decisions.  A negative result may occur with improper specimen collection / handling, submission of specimen other than nasopharyngeal swab, presence of viral mutation(s) within the areas targeted by this assay, and inadequate number of viral copies (<250 copies / mL). A negative result must be combined with clinical observations, patient history, and epidemiological information. Fact Sheet for Patients:   StrictlyIdeas.no Fact Sheet for Healthcare Providers: BankingDealers.co.za This test is not yet approved or cleared  by the [place] FDA and has been authorized for detection and/or diagnosis of SARS-CoV-2 by FDA under an Emergency Use Authorization (EUA).  This EUA will remain in effect (meaning this test can be used) for the duration of the COVID-19 declaration under Section 564(b)(1) of the Act, 21 U.S.C. section 360bbb-3(b)(1), unless the authorization is terminated or revoked sooner. Performed at [facility], [place] [address] [place], [place] [address]   Blood Culture (routine x 2)     Status: None   Collection Time: 12/28/19  5:28 AM   Specimen: BLOOD LEFT FOREARM  Result Value Ref Range Status   Specimen Description BLOOD LEFT FOREARM  Final   Special Requests    Final    BOTTLES DRAWN AEROBIC AND ANAEROBIC Blood Culture adequate volume   Culture   Final    NO GROWTH 5 DAYS Performed at [facility], [place] [address], [place], [place] [address]    Report Status 01/02/2020 FINAL  Final  Blood Culture (routine x 2)     Status: None   Collection Time: 12/28/19  5:32 AM  Specimen: BLOOD RIGHT FOREARM  Result Value Ref Range Status   Specimen Description BLOOD RIGHT FOREARM  Final   Special Requests   Final    BOTTLES DRAWN AEROBIC AND ANAEROBIC Blood Culture results may not be optimal due to an inadequate volume of blood received in culture bottles   Culture   Final    NO GROWTH 5 DAYS Performed at [facility], [place] [address], [place], [place] [address]    Report Status 01/02/2020 FINAL  Final  Culture, respiratory (non-expectorated)     Status: None   Collection Time: 12/28/19  6:51 PM   Specimen: Tracheal Aspirate; Respiratory  Result Value Ref Range Status   Specimen Description TRACHEAL ASPIRATE  Final   Special Requests NONE  Final   Gram Stain   Final    ABUNDANT WBC PRESENT, PREDOMINANTLY PMN RARE SQUAMOUS EPITHELIAL CELLS PRESENT FEW GRAM POSITIVE COCCI IN PAIRS RARE GRAM NEGATIVE COCCOBACILLI RARE GRAM POSITIVE RODS    Culture   Final    FEW Consistent with normal respiratory flora. Performed at [facility], [place] [address] [place], [place] [address]    Report Status 12/31/2019 FINAL  Final  MRSA PCR Screening     Status: None   Collection Time: 12/28/19  6:57 PM   Specimen: Nasal Mucosa; Nasopharyngeal  Result Value Ref Range Status   MRSA by PCR NEGATIVE NEGATIVE Final    Comment:        The GeneXpert MRSA Assay (FDA approved for NASAL specimens only), is one component of a comprehensive MRSA colonization surveillance program. It is not intended to diagnose MRSA infection nor to guide or monitor treatment for MRSA infections. Performed at [facility], [place] [address], [place], [place]  [address]   Novel Coronavirus, NAA (Hosp order, Send-out to Ref Lab; TAT 18-24 hrs     Status: None   Collection Time: 12/29/19  9:00 AM  Result Value Ref Range Status   SARS-CoV-2, NAA NOT DETECTED NOT DETECTED Final    Comment: (NOTE) This nucleic acid amplification test was developed and its performance characteristics determined by [employer]. Nucleic acid amplification tests include RT-PCR and TMA. This test has not been FDA cleared or approved. This test has been authorized by FDA under an Emergency Use Authorization (EUA). This test is only authorized for the duration of time the declaration that circumstances exist justifying the authorization of the emergency use of in vitro diagnostic tests for detection of SARS-CoV-2 virus and/or diagnosis of COVID-19 infection under section 564(b)(1) of the Act, 21 U.S.C. 761PJK-9(T) (1), unless the authorization is terminated or revoked sooner. When diagnostic testing is negative, the possibility of a false negative result should be considered in the context of a patient's recent exposures and the presence of clinical signs and symptoms consistent with COVID-19. An individual without symptoms of COVID- 19 and who is not shedding SARS-CoV-2  virus would expect to have a negative (not detected) result in this assay. Performed At: [facility] [address] [place], [place] [id number] Rush Farmer MD DX:[id number]    [place]  Final    Comment: Performed at [facility], [place] [address] [place], [place] [address]  Body fluid culture     Status: None   Collection Time: 12/30/19  1:02 PM   Specimen: Thoracentesis; Body Fluid  Result Value Ref Range Status   Specimen Description THORACENTESIS  Final   Special Requests NONE  Final   Gram  Stain   Final    MODERATE WBC PRESENT,BOTH PMN AND MONONUCLEAR NO ORGANISMS SEEN    Culture   Final    NO GROWTH 3 DAYS Performed at [place], [address] [place], [place] [address]    Report Status 01/02/2020 FINAL  Final  Body fluid culture     Status: None   Collection Time: 12/30/19  1:02 PM   Specimen: Thoracentesis; Body Fluid  Result Value Ref Range Status   Specimen Description THORACENTESIS  Final   Special Requests Normal  Final   Gram Stain NO WBC SEEN NO ORGANISMS SEEN   Final   Culture   Final    NO GROWTH 3 DAYS Performed at [facility], [address], [place], [place] [address]    Report Status 01/03/2020 FINAL  Final  Respiratory Panel by PCR     Status: Abnormal   Collection Time: 12/31/19  1:32 PM   Specimen: Nasopharyngeal Swab; Respiratory  Result Value Ref Range Status   Adenovirus NOT DETECTED NOT DETECTED Final   Coronavirus 229E NOT DETECTED NOT DETECTED Final    Comment: (NOTE) The Coronavirus on the Respiratory Panel, DOES NOT test for the novel  Coronavirus (2019 nCoV)    Coronavirus HKU1 NOT DETECTED NOT DETECTED Final   Coronavirus NL63 NOT DETECTED NOT DETECTED Final   Coronavirus OC43 DETECTED (A) NOT DETECTED Final   Metapneumovirus NOT DETECTED NOT DETECTED Final   Rhinovirus / Enterovirus NOT DETECTED NOT DETECTED Final   Influenza A NOT DETECTED NOT DETECTED Final   Influenza B NOT DETECTED NOT DETECTED Final   Parainfluenza Virus 1 NOT DETECTED NOT DETECTED Final   Parainfluenza Virus 2 NOT DETECTED NOT DETECTED Final   Parainfluenza Virus 3 NOT DETECTED NOT DETECTED Final   Parainfluenza Virus 4 NOT DETECTED NOT DETECTED Final   Respiratory Syncytial Virus NOT DETECTED NOT DETECTED Final   Bordetella pertussis NOT DETECTED NOT DETECTED Final   Chlamydophila pneumoniae NOT DETECTED NOT DETECTED Final   Mycoplasma pneumoniae NOT DETECTED NOT DETECTED Final    Comment: Performed at [facility], [place] [address], [place], [place] [address]         Radiology Studies: No results found.      Scheduled Meds: . aspirin  81 mg Oral Daily  . atorvastatin   80 mg Oral q1800  . carvedilol  25 mg Oral BID WC  . chlorhexidine gluconate (MEDLINE KIT)  15 mL Mouth Rinse BID  . Chlorhexidine Gluconate Cloth  6 each Topical Daily  . enoxaparin (LOVENOX) injection  40 mg Subcutaneous Q24H  . feeding supplement (ENSURE ENLIVE)  237 mL Oral TID BM  . insulin aspart  0-9 Units Subcutaneous TID WC  . insulin aspart  2 Units Subcutaneous TID WC  . insulin detemir  10 Units Subcutaneous BID  . lipase/protease/amylase  12,000 Units Oral TID WC  . losartan  100 mg Oral Daily  . nicotine  14 mg Transdermal Daily  . pantoprazole  40 mg Oral Daily  . sodium chloride flush  3 mL Intravenous Q12H  . spironolactone  12.5 mg Oral Daily  . torsemide  20 mg Oral Daily   Continuous Infusions: . sodium chloride 250 mL (12/31/19 1352)  . sodium chloride       LOS: 8 days    Time spent: 76mn  PDomenic Polite MD [facility]  01/05/2020, 11:00 AM

## 2020-01-06 ENCOUNTER — Telehealth: Payer: Self-pay | Admitting: Medical

## 2020-01-06 DIAGNOSIS — R7989 Other specified abnormal findings of blood chemistry: Secondary | ICD-10-CM

## 2020-01-06 DIAGNOSIS — R739 Hyperglycemia, unspecified: Secondary | ICD-10-CM

## 2020-01-06 LAB — BASIC METABOLIC PANEL
Anion gap: 7 (ref 5–15)
BUN: 14 mg/dL (ref 6–20)
CO2: 28 mmol/L (ref 22–32)
Calcium: 8.3 mg/dL — ABNORMAL LOW (ref 8.9–10.3)
Chloride: 104 mmol/L (ref 98–111)
Creatinine, Ser: 0.9 mg/dL (ref 0.61–1.24)
GFR calc Af Amer: >60 mL/min (ref >60)
GFR calc non Af Amer: >60 mL/min (ref >60)
Glucose, Bld: 185 mg/dL — ABNORMAL HIGH (ref 70–99)
Potassium: 4.1 mmol/L (ref 3.5–5.1)
Sodium: 139 mmol/L (ref 135–145)

## 2020-01-06 LAB — GLUCOSE, CAPILLARY
Glucose-Capillary: 138 mg/dL — ABNORMAL HIGH (ref 70–99)
Glucose-Capillary: 283 mg/dL — ABNORMAL HIGH (ref 70–99)

## 2020-01-06 MED ORDER — PANTOPRAZOLE SODIUM 40 MG PO TBEC: 40.0000 mg | DELAYED_RELEASE_TABLET | Freq: Every day | ORAL | 0 refills | Status: DC

## 2020-01-06 MED ORDER — TORSEMIDE 20 MG PO TABS: 20.0000 mg | ORAL_TABLET | Freq: Every day | ORAL | 0 refills | Status: DC

## 2020-01-06 MED ORDER — CARVEDILOL 25 MG PO TABS: 25.0000 mg | ORAL_TABLET | Freq: Two times a day (BID) | ORAL | 0 refills | Status: DC

## 2020-01-06 MED ORDER — ATORVASTATIN CALCIUM 80 MG PO TABS: 80.0000 mg | ORAL_TABLET | Freq: Every day | ORAL | 0 refills | Status: DC

## 2020-01-06 MED ORDER — LOSARTAN POTASSIUM 100 MG PO TABS: 100.0000 mg | ORAL_TABLET | Freq: Every day | ORAL | 0 refills | Status: DC

## 2020-01-06 MED ORDER — ZENPEP 20000-63000 UNITS PO CPEP: 1.0000 | ORAL_CAPSULE | Freq: Three times a day (TID) | ORAL | 0 refills | Status: DC

## 2020-01-06 MED ORDER — LEVEMIR FLEXTOUCH 100 UNIT/ML ~~LOC~~ SOPN: 12.0000 [IU] | PEN_INJECTOR | Freq: Two times a day (BID) | SUBCUTANEOUS | 0 refills | Status: DC

## 2020-01-06 MED ORDER — SPIRONOLACTONE 25 MG PO TABS: 12.5000 mg | ORAL_TABLET | Freq: Every day | ORAL | 0 refills | Status: DC

## 2020-01-06 MED ORDER — ASPIRIN 81 MG PO CHEW: 81.0000 mg | CHEWABLE_TABLET | Freq: Every day | ORAL | 0 refills | Status: DC

## 2020-01-06 MED ORDER — PANCRELIPASE (LIP-PROT-AMYL) 12000-38000 UNITS PO CPEP: 12000.0000 [IU] | ORAL_CAPSULE | Freq: Three times a day (TID) | ORAL | 0 refills | Status: DC

## 2020-01-06 MED FILL — TORSEMIDE 20 MG TABLET: 20 | 30 days supply | Qty: 30 | Fill #0

## 2020-01-06 MED FILL — CARVEDILOL 25 MG TABLET: 25 | 30 days supply | Qty: 60 | Fill #0

## 2020-01-06 MED FILL — ASPIRIN LOW DOSE 81 MG CHEW: 81 | 30 days supply | Qty: 30 | Fill #0

## 2020-01-06 MED FILL — ATORVASTATIN CALCIUM 80 MG: 80 | 30 days supply | Qty: 30 | Fill #0

## 2020-01-06 MED FILL — LOSARTAN POTASSIUM 100 MG T: 100 | 30 days supply | Qty: 30 | Fill #0

## 2020-01-06 MED FILL — CREON DR 12,000 UNITS CAP: 12000-38000 | 30 days supply | Qty: 90 | Fill #0

## 2020-01-06 MED FILL — SPIRONOLACTONE 25 MG TABLET: 25 | 30 days supply | Qty: 15 | Fill #0

## 2020-01-06 MED FILL — PANTOPRAZOLE SOD DR 40 MG T: 40 | 30 days supply | Qty: 30 | Fill #0

## 2020-01-06 NOTE — Discharge Instructions (Signed)

## 2020-01-06 NOTE — Telephone Encounter (Signed)
   Please place a TOC call to patient. Post-hospital follow-up is scheduled with Azalee Course, PA-C 01/14/20 at 11:15 am. Patient to be discharged from the hospital 01/06/20.   Thanks!  Beatriz Stallion, PA-C

## 2020-01-06 NOTE — Progress Notes (Signed)
Physical Therapy Treatment Patient Details Name: Rodney Barber MRN: [id number] DOB: 1966-03-01 Today's Date: 01/06/2020    History of Present Illness Pt is a 54 y.o. male admitted 12/28/19 with cough, SOB. Found to be hypoxic requiring NRB. Progressing respiratory distress requiring intubation; ETT 5/23-5/25. PMH includes homelessness, HTN, DM2, alcoholic pancreatitis.   PT Comments    Pt progressing well with mobility. Today's session focused on gait training with and without SPC, pt's stability improving. SpO2 100% on RA, HR 89 post-ambulation. Pt preparing for d/c today.  Follow Up Recommendations  No PT follow up;Supervision - Intermittent     Equipment Recommendations  Cane    Recommendations for Other Services       Precautions / Restrictions Precautions Precautions: Fall Restrictions Weight Bearing Restrictions: No    Mobility  Bed Mobility Overal bed mobility: Independent                Transfers Overall transfer level: Independent Equipment used: None Transfers: Sit to/from Stand           General transfer comment: Indep to stand from bed, recliner  Ambulation/Gait Ambulation/Gait assistance: Supervision Gait Distance (Feet): 380 Feet Assistive device: None;Straight cane Gait Pattern/deviations: Step-through pattern;Decreased stride length Gait velocity: Decreased Gait velocity interpretation: 1.31 - 2.62 ft/sec, indicative of limited community ambulator General Gait Details: Slow, steady ambulation throughout room without DME. Trialled gait training with SPC in hallway per pt request, pt used in both hands and ultimately preferred holding SPC in RUE; cues for sequencing, pt performed well   Stairs             Wheelchair Mobility    Modified Rankin (Stroke Patients Only)       Balance Overall balance assessment: Needs assistance   Sitting balance-Leahy Scale: Normal       Standing balance-Leahy Scale: Good Standing balance  comment: Able to navigate room and stand to void at toilet without overt instability               High Level Balance Comments: Able to step over obstacle using SPC, slowed down to adjust step and speed, but no overt instability or LOB            Cognition Arousal/Alertness: Awake/alert Behavior During Therapy: WFL for tasks assessed/performed Overall Cognitive Status: Within Functional Limits for tasks assessed Area of Impairment: Safety/judgement                         Safety/Judgement: Decreased awareness of safety;Decreased awareness of deficits            Exercises      General Comments General comments (skin integrity, edema, etc.): SpO2 100% on RA, HR 89 immediately post-ambulation      Pertinent Vitals/Pain Pain Assessment: No/denies pain    Home Living                      Prior Function            PT Goals (current goals can now be found in the care plan section) Progress towards PT goals: Progressing toward goals    Frequency    Min 3X/week      PT Plan Current plan remains appropriate    Co-evaluation              AM-PAC PT "6 Clicks" Mobility   Outcome Measure  Help needed turning from your back to your side while in a flat  bed without using bedrails?: None Help needed moving from lying on your back to sitting on the side of a flat bed without using bedrails?: None Help needed moving to and from a bed to a chair (including a wheelchair)?: None Help needed standing up from a chair using your arms (e.g., wheelchair or bedside chair)?: None Help needed to walk in hospital room?: None Help needed climbing 3-5 steps with a railing? : A Little 6 Click Score: 23    End of Session Equipment Utilized During Treatment: Gait belt Activity Tolerance: Patient tolerated treatment well Patient left: in chair;with call bell/phone within reach;with chair alarm set Nurse Communication: Mobility status PT Visit Diagnosis:  Unsteadiness on feet (R26.81);Muscle weakness (generalized) (M62.81)     Time: 5537-4827 PT Time Calculation (min) (ACUTE ONLY): 26 min  Charges:  $Gait Training: 23-37 mins                     Mabeline Caras, PT, DPT Acute Rehabilitation Services  Pager [phone] Office [place] 01/06/2020, 10:06 AM

## 2020-01-06 NOTE — Progress Notes (Addendum)
Progress Note  Patient Name: Rodney Barber Date of Encounter: 01/06/2020  Primary Cardiologist: Peter Martinique, MD   Subjective   Feels well this morning.  No chest pain or shortness of breath.  He anticipates discharge home later today.  Inpatient Medications    Scheduled Meds: . aspirin  81 mg Oral Daily  . atorvastatin  80 mg Oral q1800  . carvedilol  25 mg Oral BID WC  . chlorhexidine gluconate (MEDLINE KIT)  15 mL Mouth Rinse BID  . Chlorhexidine Gluconate Cloth  6 each Topical Daily  . enoxaparin (LOVENOX) injection  40 mg Subcutaneous Q24H  . feeding supplement (ENSURE ENLIVE)  237 mL Oral TID BM  . insulin aspart  0-9 Units Subcutaneous TID WC  . insulin aspart  2 Units Subcutaneous TID WC  . insulin detemir  10 Units Subcutaneous BID  . lipase/protease/amylase  12,000 Units Oral TID WC  . losartan  100 mg Oral Daily  . nicotine  14 mg Transdermal Daily  . pantoprazole  40 mg Oral Daily  . sodium chloride flush  3 mL Intravenous Q12H  . spironolactone  12.5 mg Oral Daily  . torsemide  20 mg Oral Daily   Continuous Infusions: . sodium chloride 250 mL (12/31/19 1352)  . sodium chloride     PRN Meds: sodium chloride, sodium chloride, acetaminophen, dextrose, docusate sodium, ondansetron (ZOFRAN) IV, polyethylene glycol, sodium chloride flush   Vital Signs    Vitals:   01/05/20 1135 01/05/20 1944 01/06/20 0423 01/06/20 0743  BP: 136/77 129/76 (!) 141/84 140/72  Pulse: 73 80 77 84  Resp: 20 17 18    Temp: 97.8 F (36.6 C) 98.8 F (37.1 C) 99.4 F (37.4 C)   TempSrc: Oral Oral Oral   SpO2: 100% 99% 100% 100%  Weight:   52.4 kg   Height:        Intake/Output Summary (Last 24 hours) at 01/06/2020 0924 Last data filed at 01/06/2020 0900 Gross per 24 hour  Intake 1044 ml  Output 1650 ml  Net -606 ml   Last 3 Weights 01/06/2020 01/05/2020 01/04/2020  Weight (lbs) 115 lb 9.6 oz 117 lb 9.6 oz 119 lb 9.6 oz  Weight (kg) 52.436 kg 53.343 kg 54.25 kg       Telemetry    Normal sinus rhythm, rare PVCs.- Personally Reviewed   Physical Exam  Thin male, no acute distress.  HEENT with poor dentition, otherwise unremarkable. GEN: No acute distress.   Neck: No JVD Cardiac: RRR, no murmurs, rubs, or gallops.  Respiratory: Clear to auscultation bilaterally. GI: Soft, nontender, non-distended  MS: No edema; No deformity. Neuro:  Nonfocal  Psych: Normal affect   Labs    High Sensitivity Troponin:   Recent Labs  Lab 12/28/19 0512 12/28/19 0643  TROPONINIHS 54* 64*      Chemistry Recent Labs  Lab 12/30/19 1206 12/30/19 1636 12/31/19 0559 01/04/20 0625 01/05/20 0455 01/06/20 0510  NA  --   --    < > 141 142 139  K  --   --    < > 3.9 3.7 4.1  CL  --   --    < > 108 107 104  CO2  --   --    < > 27 29 28   GLUCOSE  --   --    < > 69* 28* 185*  BUN  --   --    < > 13 12 14   CREATININE  --   --    < >  0.86 0.95 0.90  CALCIUM  --   --    < > 8.2* 8.1* 8.3*  PROT 6.0* 6.2*  --   --   --   --   GFRNONAA  --   --    < > >60 >60 >60  GFRAA  --   --    < > >60 >60 >60  ANIONGAP  --   --    < > 6 6 7    < > = values in this interval not displayed.     Hematology Recent Labs  Lab 01/02/20 0401 01/02/20 2018 01/03/20 0514  WBC 9.0 8.5 8.3  RBC 3.17* 3.32* 3.11*  HGB 9.3* 9.5* 8.9*  HCT 29.6* 30.8* 28.9*  MCV 93.4 92.8 92.9  MCH 29.3 28.6 28.6  MCHC 31.4 30.8 30.8  RDW 14.2 14.2 14.1  PLT 353 352 339    BNPNo results for input(s): BNP, PROBNP in the last 168 hours.   DDimer No results for input(s): DDIMER in the last 168 hours.   Radiology    No results found.   Patient Profile     54 y.o. male with DM, HTN, tobacco abuse, etoh pancreatitis, admitted 12/28/2019 for hypoxic respiratory failure 2/2 CHF.   Assessment & Plan    1.  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  The patient is treated  with a combination of carvedilol, spironolactone, and losartan.  He is on a loop diuretic with torsemide 20 mg daily.  Therapy is felt to be optimal.  Follow-up will be challenging because of the patient social circumstances.  He will be discharged to the [place] where he was previously living.  Will arrange close follow-up with Dr. Martinique.  Appears euvolemic on my exam today. 2.  Coronary artery disease, no angina: Appropriately treated with aspirin 81 mg and a statin drug.  [facility] will sign off.   Medication Recommendations: Continue current therapy as outlined above Other recommendations (labs, testing, etc): None Follow up as an outpatient: Dr. Martinique, will arrange  For questions or updates, please contact [facility] Please consult [email or web address] for contact info under        Signed, Sherren Mocha, MD  01/06/2020, 9:24 AM

## 2020-01-06 NOTE — Discharge Summary (Signed)
Physician Discharge Summary  Rodney Barber MVH:[id number] DOB: 16-Oct-1965 DOA: 12/28/2019  PCP: [name], MD  Admit date: 12/28/2019 Discharge date: 01/06/2020  Time spent: 35 minutes  Recommendations for Outpatient Follow-up:  [facility], office to arrange follow-up [facility] on 6/15 at 11 AM Please check BMP in 1 week  Discharge Diagnoses:  Acute hypoxic respiratory failure Acute systolic and diastolic CHF, EF of 35% Coronary artery disease Chronic alcoholic pancreatitis EtOH abuse Transudative pleural effusions Type 2 diabetes mellitus on insulin Homelessness   Hyperglycemia   Tobacco abuse   Acute respiratory failure with hypoxia (HCC)   S/P thoracentesis   Acute on chronic combined systolic and diastolic CHF (congestive heart failure) (HCC)   Essential hypertension   Abnormal nuclear stress test   Elevated brain natriuretic peptide (BNP) level   Discharge Condition: Improved  Diet recommendation: Low-sodium, diabetic, heart healthy  Filed Weights   01/04/20 0014 01/05/20 0417 01/06/20 0423  Weight: 54.3 kg 53.3 kg 52.4 kg    History of present illness:  54 yo homeless male with history of alcoholic pancreatitis, htn, DM2 poorly controlled, presented to [facility] 5/22 with CC SOB, cough. In ED, pt hypoxic, SpO2 70s on RA. Initially, placed on NRB , progressive resp distress and hypoxia was intubated in the emergency room.  Hospital Course:   Acute hypoxemic respiratory failure -Acute systolic CHF, EF of 35% lower from prior -Admitted to the ICU with acute hypoxic respiratory failure requiring mechanical intubation, diuresed with IV Lasix, extubated 5/25 -Status post bilateral thoracentesis 5/25, fluid was transudative -Followed by cardiology this admission -Clinically improved with diuresis, work-up noted echo with drop in EF down to 35%, subsequently underwent Lexiscan Myoview which was abnormal -Then had left heart  catheterization-noted 70% RCA disease and otherwise nonobstructive CAD, medical management was recommended -Clinically improved and stable now, transition to oral torsemide and Aldactone, also started on carvedilol, losartan -Discharged back to shelter today -Follow-up arranged with [facility] and [facility] to arrange follow-up as well, needs BMP in 1 week, importance of compliance with medications, salt restriction, diabetic diet and follow-up emphasized  Chronic acoholic pancreatitis  -Resume Creon   COPD/heavy tobacco abuse -Smoking 3 packs/day, counseled -Nebs PRN  DM2 brittle -Brittle diabetic, with significant fluctuations in blood sugars noted -Subsequently transitioned to Levemir 15 units in the morning and 12 at bedtime  Homelessness -Followed by social work inpatient, patient is uninsured without family support, he will be discharged back to the shelter at [facility]  Chronic anemia -Stable  H/o HTN -BP stable, monitor    Consultants:   Cardiology  PCCM   Procedures: Prox LAD to Dist LAD lesion is 30% stenosed.  Mid Cx to Dist Cx lesion is 60% stenosed.  Mid RCA lesion is 70% stenosed.  There is severe left ventricular systolic dysfunction.  LV end diastolic pressure is mildly elevated.  The left ventricular ejection fraction is 25-35% by visual estimate.   1. Diffuse coronary atherosclerosis with heavy calcification. 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 Severe LV dysfunction EF estimated at 25-30% 3. Elevated EDP 24 mm Hg  Plan: aggressive medical therapy for CHF. Will add aldactone to Coreg, lasix, and losartan. Titrate as BP allows. High dose statin therapy. LV dysfunction is out of proportion to the degree of CAD.    Discharge Exam: Vitals:   01/06/20 0423 01/06/20 0743  BP: (!) 141/84 140/72  Pulse: 77 84  Resp:  18   Temp: 99.4 F (37.4 C)   SpO2: 100% 100%    General:  AAOx3 Cardiovascular: S1-S2, regular rate rhythm  respiratory: Diminished breath sounds the bases  Discharge Instructions   Discharge Instructions    Diet - low sodium heart healthy   Complete by: As directed    Increase activity slowly   Complete by: As directed      Allergies as of 01/06/2020   No Known Allergies     Medication List    STOP taking these medications   amLODipine 10 MG tablet Commonly known as: NORVASC   furosemide 20 MG tablet Commonly known as: LASIX   lisinopril 20 MG tablet Commonly known as: ZESTRIL     TAKE these medications   aspirin 81 MG chewable tablet Chew 1 tablet (81 mg total) by mouth daily. Start taking on: January 07, 2020   atorvastatin 80 MG tablet Commonly known as: LIPITOR Take 1 tablet (80 mg total) by mouth daily at 6 PM. What changed:   medication strength  how much to take   blood glucose meter kit and supplies Kit Dispense based on patient and insurance preference. Use up to four times daily as directed. (FOR ICD-9 250.00, 250.01).   carvedilol 25 MG tablet Commonly known as: COREG Take 1 tablet (25 mg total) by mouth 2 (two) times daily with a meal.   feeding supplement (GLUCERNA SHAKE) Liqd Take 237 mLs by mouth 3 (three) times daily between meals.   Levemir FlexTouch 100 UNIT/ML FlexPen Generic drug: insulin detemir Inject 12-15 Units into the skin 2 (two) times daily. Take 15units in am and 12units in pm What changed:   how much to take  additional instructions   losartan 100 MG tablet Commonly known as: COZAAR Take 1 tablet (100 mg total) by mouth daily. Start taking on: January 07, 2020   pantoprazole 40 MG tablet Commonly known as: PROTONIX Take 1 tablet (40 mg total) by mouth daily. Start taking on: January 07, 2020   spironolactone 25 MG tablet Commonly known as: ALDACTONE Take 0.5 tablets (12.5 mg total) by mouth daily. Start taking on: January 07, 2020   torsemide 20 MG tablet Commonly known as:  DEMADEX Take 1 tablet (20 mg total) by mouth daily. Start taking on: January 07, 2020   Zenpep 20000-63000 units Cpep Generic drug: Pancrelipase (Lip-Prot-Amyl) Take 1 capsule by mouth 3 (three) times daily with meals.            Durable Medical Equipment  (From admission, onward)         Start     Ordered   01/06/20 0844  For home use only DME Cane  Once     01/06/20 0843         No Known Allergies Follow-up Information    [facility]. Go on 01/20/2020.   Why: @11 :00am Contact information: [address] [place] [address] [phone]           The results of significant diagnostics from this hospitalization (including imaging, microbiology, ancillary and laboratory) are listed below for reference.    Significant Diagnostic Studies: DG Chest 1 View  Result Date: 12/28/2019 CLINICAL DATA:  Post intubation EXAM: CHEST  1 VIEW COMPARISON:  12/28/2019 at 0430 hours FINDINGS: Endotracheal tube terminates 3 cm above the carina. Enteric tube terminates in the gastric cardia. Multifocal pneumonia, lingular and lower lobe predominant. Suspected small left pleural effusion. The heart is normal in  size.  Thoracic aortic atherosclerosis. IMPRESSION: Endotracheal tube terminates 3 cm above the carina. Enteric tube terminates in the gastric cardia. Multifocal pneumonia, lower lobe predominant. Electronically Signed   By: Charline Bills M.D.   On: 12/28/2019 06:15   CT Angio Chest PE W and/or Wo Contrast  Result Date: 12/28/2019 CLINICAL DATA:  Shortness of breath and abdominal distension and pain, initial encounter EXAM: CT ANGIOGRAPHY CHEST CT ABDOMEN AND PELVIS WITH CONTRAST TECHNIQUE: Multidetector CT imaging of the chest was performed using the standard protocol during bolus administration of intravenous contrast. Multiplanar CT image reconstructions and MIPs were obtained to evaluate the vascular anatomy. Multidetector CT  imaging of the abdomen and pelvis was performed using the standard protocol during bolus administration of intravenous contrast. CONTRAST:  OMNIPAQUE IOHEXOL 350 MG/ML SOLN COMPARISON:  10/27/2019 FINDINGS: CTA CHEST FINDINGS Cardiovascular: Thoracic aorta demonstrates no aneurysmal dilatation or dissection. Mild atherosclerotic calcifications are seen. The pulmonary artery shows a normal branching pattern. No definitive pulmonary embolism is seen. Coronary calcifications are noted. Mediastinum/Nodes: Thoracic inlet demonstrates endotracheal tube in satisfactory position. Gastric catheter extends into the stomach. The esophagus as visualized is within normal limits. Few scattered small mediastinal lymph nodes are seen. Small hilar adenopathy is present as well although incompletely evaluated due to significant consolidation and surrounding soft tissue particularly in the lower lobes. Lungs/Pleura: Large bilateral pleural effusions are noted. Patchy airspace opacity is noted throughout both lungs with increased consolidation in the lower lobes bilaterally. This is consistent with multifocal pneumonia. These changes have progressed significantly in the interval from the prior exam no pneumothorax is noted. Musculoskeletal: Degenerative changes of the thoracic spine are noted. Review of the MIP images confirms the above findings. CT ABDOMEN and PELVIS FINDINGS Hepatobiliary: No focal liver abnormality is seen. No gallstones, gallbladder wall thickening, or biliary dilatation. Pancreas: Scattered calcifications are noted throughout the pancreas with pancreatic ductal dilatation consistent with chronic pancreatitis. These changes are stable from the prior CT examination. Spleen: Normal in size without focal abnormality. Adrenals/Urinary Tract: Adrenal glands appear within normal limits. Kidneys demonstrate bilateral renal calculi worse on the left than the right but similar to that seen on prior CT examination.  Mild fullness of the collecting systems is noted although felt to be related to significant over distention of the bladder as no definitive ureteral stones are seen. Stomach/Bowel: No obstructive or inflammatory changes of the colon are seen. The appendix is within normal limits. No small bowel or gastric abnormality is seen. Vascular/Lymphatic: Aortic atherosclerosis. No enlarged abdominal or pelvic lymph nodes. Reproductive: Prostate is unremarkable. Other: No abdominal wall hernia or abnormality. No abdominopelvic ascites. Musculoskeletal: Degenerative changes of lumbar spine are noted. Review of the MIP images confirms the above findings. IMPRESSION: CTA of the chest: No evidence of pulmonary emboli. Significant increase in bilateral parenchymal opacities with bilateral lower lobe consolidation and large effusions bilaterally. These changes have progressed from the prior exam and are consistent with multifocal pneumonia and reactive effusions. Tubes and lines in satisfactory position. CT of the abdomen and pelvis: Chronic changes of pancreatitis with ductal dilatation. This is stable from the previous exam. Bilateral renal calculi worse on the left also stable from the prior exam. The bladder is over distended. No other focal abnormalities in the abdomen and pelvis are seen. Aortic Atherosclerosis (ICD10-I70.0). Electronically Signed   By: Alcide Clever M.D.   On: 12/28/2019 09:45   CT ABDOMEN PELVIS W CONTRAST  Result Date: 12/28/2019 CLINICAL DATA:  Shortness of breath  and abdominal distension and pain, initial encounter EXAM: CT ANGIOGRAPHY CHEST CT ABDOMEN AND PELVIS WITH CONTRAST TECHNIQUE: Multidetector CT imaging of the chest was performed using the standard protocol during bolus administration of intravenous contrast. Multiplanar CT image reconstructions and MIPs were obtained to evaluate the vascular anatomy. Multidetector CT imaging of the abdomen and pelvis was performed using the standard protocol  during bolus administration of intravenous contrast. CONTRAST:  OMNIPAQUE IOHEXOL 350 MG/ML SOLN COMPARISON:  10/27/2019 FINDINGS: CTA CHEST FINDINGS Cardiovascular: Thoracic aorta demonstrates no aneurysmal dilatation or dissection. Mild atherosclerotic calcifications are seen. The pulmonary artery shows a normal branching pattern. No definitive pulmonary embolism is seen. Coronary calcifications are noted. Mediastinum/Nodes: Thoracic inlet demonstrates endotracheal tube in satisfactory position. Gastric catheter extends into the stomach. The esophagus as visualized is within normal limits. Few scattered small mediastinal lymph nodes are seen. Small hilar adenopathy is present as well although incompletely evaluated due to significant consolidation and surrounding soft tissue particularly in the lower lobes. Lungs/Pleura: Large bilateral pleural effusions are noted. Patchy airspace opacity is noted throughout both lungs with increased consolidation in the lower lobes bilaterally. This is consistent with multifocal pneumonia. These changes have progressed significantly in the interval from the prior exam no pneumothorax is noted. Musculoskeletal: Degenerative changes of the thoracic spine are noted. Review of the MIP images confirms the above findings. CT ABDOMEN and PELVIS FINDINGS Hepatobiliary: No focal liver abnormality is seen. No gallstones, gallbladder wall thickening, or biliary dilatation. Pancreas: Scattered calcifications are noted throughout the pancreas with pancreatic ductal dilatation consistent with chronic pancreatitis. These changes are stable from the prior CT examination. Spleen: Normal in size without focal abnormality. Adrenals/Urinary Tract: Adrenal glands appear within normal limits. Kidneys demonstrate bilateral renal calculi worse on the left than the right but similar to that seen on prior CT examination. Mild fullness of the collecting systems is noted although felt to be related to  significant over distention of the bladder as no definitive ureteral stones are seen. Stomach/Bowel: No obstructive or inflammatory changes of the colon are seen. The appendix is within normal limits. No small bowel or gastric abnormality is seen. Vascular/Lymphatic: Aortic atherosclerosis. No enlarged abdominal or pelvic lymph nodes. Reproductive: Prostate is unremarkable. Other: No abdominal wall hernia or abnormality. No abdominopelvic ascites. Musculoskeletal: Degenerative changes of lumbar spine are noted. Review of the MIP images confirms the above findings. IMPRESSION: CTA of the chest: No evidence of pulmonary emboli. Significant increase in bilateral parenchymal opacities with bilateral lower lobe consolidation and large effusions bilaterally. These changes have progressed from the prior exam and are consistent with multifocal pneumonia and reactive effusions. Tubes and lines in satisfactory position. CT of the abdomen and pelvis: Chronic changes of pancreatitis with ductal dilatation. This is stable from the previous exam. Bilateral renal calculi worse on the left also stable from the prior exam. The bladder is over distended. No other focal abnormalities in the abdomen and pelvis are seen. Aortic Atherosclerosis (ICD10-I70.0). Electronically Signed   By: Alcide Clever M.D.   On: 12/28/2019 09:45   CARDIAC CATHETERIZATION  Result Date: 01/02/2020  Prox LAD to Dist LAD lesion is 30% stenosed.  Mid Cx to Dist Cx lesion is 60% stenosed.  Mid RCA lesion is 70% stenosed.  There is severe left ventricular systolic dysfunction.  LV end diastolic pressure is mildly elevated.  The left ventricular ejection fraction is 25-35% by visual estimate.  1. Diffuse coronary atherosclerosis with heavy calcification. Modest single vessel obstructive CAD involving the  mid RCA 2. Severe LV dysfunction EF estimated at 25-30% 3. Elevated EDP 24 mm Hg Plan: aggressive medical therapy for CHF. Will add aldactone to Coreg,  lasix, and losartan. Titrate as BP allows. High dose statin therapy. LV dysfunction is out of proportion to the degree of CAD.   NM Myocar Multi W/Spect W/Wall Motion / EF  Result Date: 01/01/2020  There was no ST segment deviation noted during stress.  Defect 1: There is a large defect present in the basal inferolateral, mid anteroseptal, mid inferolateral and apical septal location.  Findings consistent with prior myocardial infarction with peri-infarct ischemia.  This is a high risk study.  The left ventricular ejection fraction is severely decreased (<30%).  Abnormal, high risk stress nuclear study with prior inferolateral and septal infarcts with mild peri-infarct ischemia; gated ejection fraction 26% with global hypokinesis.  Moderate left ventricular enlargement.  Study interpreted as high risk due to reduced LV function.   DG Chest Port 1 View  Result Date: 12/30/2019 CLINICAL DATA:  Post LEFT thoracentesis EXAM: PORTABLE CHEST 1 VIEW COMPARISON:  Portable exam 1305 hours compared to 12/17 hours FINDINGS: Decreased LEFT pleural effusion and basilar atelectasis post thoracentesis. No pneumothorax. Lungs otherwise clear. Heart size normal. Atherosclerotic calcification aorta. IMPRESSION: No pneumothorax following LEFT thoracentesis. Electronically Signed   By: Ulyses Southward M.D.   On: 12/30/2019 13:14   DG Chest Port 1 View  Result Date: 12/30/2019 CLINICAL DATA:  54 year old male status post thoracentesis on the right. EXAM: PORTABLE CHEST 1 VIEW COMPARISON:  Chest CTA 12/28/2019. Portable chest earlier today at 0857 hours. FINDINGS: Portable AP semi upright view at 1217 hours. No residual pleural effusion is evident on the right. No pneumothorax identified. Persistent left side veiling pleural effusion. Extubated and enteric tube removed. Mediastinal contours are stable and within normal limits. No areas of worsening ventilation. IMPRESSION: 1. No pneumothorax following right thoracentesis and  no residual right pleural effusion is evident. 2. Stable left pleural effusion. 3. Extubated and enteric tube removed. Electronically Signed   By: Odessa Fleming M.D.   On: 12/30/2019 12:29   DG CHEST PORT 1 VIEW  Result Date: 12/30/2019 CLINICAL DATA:  Hypoxia EXAM: PORTABLE CHEST 1 VIEW COMPARISON:  Dec 29, 2019 FINDINGS: Endotracheal tube tip is 2.3 cm above the carina. Nasogastric tube tip and side port are below the diaphragm. No pneumothorax. There are small pleural effusions bilaterally. There is consolidation in the left lower lobe. Heart is upper normal in size with pulmonary vascularity normal. No adenopathy. There is aortic atherosclerosis. No bone lesions. IMPRESSION: Tube positions as described without pneumothorax. Pleural effusions bilaterally. Left lower lobe consolidation, likely due to atelectasis with potential superimposed pneumonia. Heart upper normal in size. Aortic Atherosclerosis (ICD10-I70.0). Electronically Signed   By: Bretta Bang III M.D.   On: 12/30/2019 09:08   DG Chest Port 1 View  Result Date: 12/29/2019 CLINICAL DATA:  Endotracheal intubation EXAM: PORTABLE CHEST 1 VIEW COMPARISON:  CT 12/28/2019, radiograph 12/28/2019 FINDINGS: *Endotracheal tube in the mid trachea, 3.5 cm from the carina. *Transesophageal tube tip below the level of imaging, beyond the GE junction. *Telemetry leads overlie the chest. Persistent gradient opacities towards the lung bases likely reflecting residual layering effusion, passive atelectasis and some underlying consolidation though much of the more upper lung base airspace opacities appear improved from prior which could reflect some improving edema. No pneumothorax. Cardiomediastinal contours are stable from prior with a calcified aorta. No acute osseous or soft tissue abnormality. IMPRESSION: 1.  Endotracheal tube 3.5 cm from the carina. 2. Transesophageal tube tip below the level of imaging, beyond the GE junction. 3. Persistent gradient opacities  towards the lung bases likely reflecting residual layering effusion, passive atelectasis and some underlying airspace disease/consolidation. Slight improvement of the airspace disease compared to prior. May reflect improving edema or infection. Electronically Signed   By: Kreg Shropshire M.D.   On: 12/29/2019 05:17   DG Chest Port 1 View  Result Date: 12/28/2019 CLINICAL DATA:  Shortness of breath, hypoxia EXAM: PORTABLE CHEST 1 VIEW COMPARISON:  11/14/2019 FINDINGS: Multifocal pneumonia in the lingula, right middle lobe, and bilateral lower lobes. Additional mild patchy opacity in the right upper lobe. Possible small bilateral pleural effusions, although equivocal. No pneumothorax. Cardiomegaly.  Thoracic aortic atherosclerosis. IMPRESSION: Multifocal pneumonia. Electronically Signed   By: Charline Bills M.D.   On: 12/28/2019 05:24   DG Abd Portable 1 View  Result Date: 12/28/2019 CLINICAL DATA:  Post OG insertion EXAM: PORTABLE ABDOMEN - 1 VIEW COMPARISON:  CT abdomen/pelvis dated 10/27/2019 FINDINGS: Enteric tube terminates in the proximal gastric body. Nonobstructive bowel gas pattern. Bilateral lower lobe opacities with suspected small left pleural effusion. IMPRESSION: Enteric tube terminates in the proximal gastric body. Electronically Signed   By: Charline Bills M.D.   On: 12/28/2019 06:15   ECHOCARDIOGRAM LIMITED  Result Date: 12/28/2019    ECHOCARDIOGRAM LIMITED REPORT   Patient Name:   Rodney Barber Date of Exam: 12/28/2019 Medical Rec #:  [id number]        Height:       68.0 in Accession #:    [id number]       Weight:       136.7 lb Date of Birth:  [birth year]        BSA:          1.738 m Patient Age:    54 years         BP:           111/70 mmHg Patient Gender: M                HR:           67 bpm. Exam Location:  Inpatient Procedure: 2D Echo Indications:    acute respiratory failure  History:        Patient has prior history of Echocardiogram examinations, most                 recent  10/22/2019. Risk Factors:Diabetes, Hypertension and                 Current Smoker.  Sonographer:    Celene Skeen RDCS (AE) Referring Phys: [id number] GRACE E BOWSER  Sonographer Comments: Echo performed with patient supine and on artificial respirator. Image acquisition challenging due to respiratory motion. limited mobility IMPRESSIONS  1. Left ventricular ejection fraction, by estimation, is 35 to 40%. The left ventricle has moderately decreased function. The left ventricle demonstrates global hypokinesis. There is moderate concentric left ventricular hypertrophy. Left ventricular diastolic function could not be evaluated.  2. Right ventricular systolic function is normal. The right ventricular size is normal. There is normal pulmonary artery systolic pressure.  3. The mitral valve is normal in structure. Trivial mitral valve regurgitation.  4. The aortic valve is tricuspid. Aortic valve regurgitation is not visualized.  5. The inferior vena cava is dilated in size with >50% respiratory variability, suggesting right atrial pressure of 8 mmHg. FINDINGS  Left Ventricle: Left ventricular ejection fraction,  by estimation, is 35 to 40%. The left ventricle has moderately decreased function. The left ventricle demonstrates global hypokinesis. The left ventricular internal cavity size was normal in size. There is moderate concentric left ventricular hypertrophy. Right Ventricle: The right ventricular size is normal. No increase in right ventricular wall thickness. Right ventricular systolic function is normal. There is normal pulmonary artery systolic pressure. The tricuspid regurgitant velocity is 2.25 m/s, and  with an assumed right atrial pressure of 8 mmHg, the estimated right ventricular systolic pressure is 28.2 mmHg. Pericardium: Trivial pericardial effusion is present. Mitral Valve: The mitral valve is normal in structure. Trivial mitral valve regurgitation. Tricuspid Valve: The tricuspid valve is grossly normal.  Tricuspid valve regurgitation is trivial. Aortic Valve: The aortic valve is tricuspid. . There is mild thickening and mild calcification of the aortic valve. Aortic valve regurgitation is not visualized. There is mild thickening of the aortic valve. There is mild calcification of the aortic valve. Pulmonic Valve: The pulmonic valve was normal in structure. Pulmonic valve regurgitation is trivial. Aorta: The aortic root is normal in size and structure. Venous: The inferior vena cava is dilated in size with greater than 50% respiratory variability, suggesting right atrial pressure of 8 mmHg. Additional Comments: There is a small pleural effusion in the left lateral region.  LEFT VENTRICLE PLAX 2D LVIDd:         5.14 cm LV PW:         1.65 cm LV IVS:        1.33 cm  TRICUSPID VALVE TR Peak grad:   20.2 mmHg TR Vmax:        225.00 cm/s Chilton Si MD Electronically signed by Chilton Si MD Signature Date/Time: 12/28/2019/4:23:00 PM    Final     Microbiology: Recent Results (from the past 240 hour(s))  SARS CORONAVIRUS 2 (TAT 6-24 HRS) Nasopharyngeal Nasopharyngeal Swab     Status: None   Collection Time: 12/28/19  4:20 AM   Specimen: Nasopharyngeal Swab  Result Value Ref Range Status   SARS Coronavirus 2 NEGATIVE NEGATIVE Final    Comment: (NOTE) SARS-CoV-2 target nucleic acids are NOT DETECTED. The SARS-CoV-2 RNA is generally detectable in upper and lower respiratory specimens during the acute phase of infection. Negative results do not preclude SARS-CoV-2 infection, do not rule out co-infections with other pathogens, and should not be used as the sole basis for treatment or other patient management decisions. Negative results must be combined with clinical observations, patient history, and epidemiological information. The expected result is Negative. Fact Sheet for Patients: HairSlick.no Fact Sheet for Healthcare  Providers: quierodirigir.com This test is not yet approved or cleared by the [place] FDA and  has been authorized for detection and/or diagnosis of SARS-CoV-2 by FDA under an Emergency Use Authorization (EUA). This EUA will remain  in effect (meaning this test can be used) for the duration of the COVID-19 declaration under Section 56 4(b)(1) of the Act, 21 U.S.C. section 360bbb-3(b)(1), unless the authorization is terminated or revoked sooner. Performed at [facility], [address] [place], [place] [address]   SARS Coronavirus 2 by RT PCR (hospital order, performed in [facility] hospital lab) Nasopharyngeal Nasopharyngeal Swab     Status: None   Collection Time: 12/28/19  4:22 AM   Specimen: Nasopharyngeal Swab  Result Value Ref Range Status   SARS Coronavirus 2 NEGATIVE NEGATIVE Final    Comment: (NOTE) SARS-CoV-2 target nucleic acids are NOT DETECTED. The SARS-CoV-2 RNA is generally  detectable in upper and lower respiratory specimens during the acute phase of infection. The lowest concentration of SARS-CoV-2 viral copies this assay can detect is 250 copies / mL. A negative result does not preclude SARS-CoV-2 infection and should not be used as the sole basis for treatment or other patient management decisions.  A negative result may occur with improper specimen collection / handling, submission of specimen other than nasopharyngeal swab, presence of viral mutation(s) within the areas targeted by this assay, and inadequate number of viral copies (<250 copies / mL). A negative result must be combined with clinical observations, patient history, and epidemiological information. Fact Sheet for Patients:   BoilerBrush.com.cy Fact Sheet for Healthcare Providers: https://pope.com/ This test is not yet approved or cleared  by the [place] FDA and has been authorized for detection and/or diagnosis  of SARS-CoV-2 by FDA under an Emergency Use Authorization (EUA).  This EUA will remain in effect (meaning this test can be used) for the duration of the COVID-19 declaration under Section 564(b)(1) of the Act, 21 U.S.C. section 360bbb-3(b)(1), unless the authorization is terminated or revoked sooner. Performed at [facility], [address], [place], [place] [address]   Blood Culture (routine x 2)     Status: None   Collection Time: 12/28/19  5:28 AM   Specimen: BLOOD LEFT FOREARM  Result Value Ref Range Status   Specimen Description BLOOD LEFT FOREARM  Final   Special Requests   Final    BOTTLES DRAWN AEROBIC AND ANAEROBIC Blood Culture adequate volume   Culture   Final    NO GROWTH 5 DAYS Performed at [facility], [address], [place], [place] [address]    Report Status 01/02/2020 FINAL  Final  Blood Culture (routine x 2)     Status: None   Collection Time: 12/28/19  5:32 AM   Specimen: BLOOD RIGHT FOREARM  Result Value Ref Range Status   Specimen Description BLOOD RIGHT FOREARM  Final   Special Requests   Final    BOTTLES DRAWN AEROBIC AND ANAEROBIC Blood Culture results may not be optimal due to an inadequate volume of blood received in culture bottles   Culture   Final    NO GROWTH 5 DAYS Performed at [facility], [address], [place], [place] [address]    Report Status 01/02/2020 FINAL  Final  Culture, respiratory (non-expectorated)     Status: None   Collection Time: 12/28/19  6:51 PM   Specimen: Tracheal Aspirate; Respiratory  Result Value Ref Range Status   Specimen Description TRACHEAL ASPIRATE  Final   Special Requests NONE  Final   Gram Stain   Final    ABUNDANT WBC PRESENT, PREDOMINANTLY PMN RARE SQUAMOUS EPITHELIAL CELLS PRESENT FEW GRAM POSITIVE COCCI IN PAIRS RARE GRAM NEGATIVE COCCOBACILLI RARE GRAM POSITIVE RODS    Culture   Final    FEW Consistent with normal respiratory flora. Performed at [facility], [address] [place], [place] [address]    Report Status 12/31/2019 FINAL  Final  MRSA PCR Screening     Status: None   Collection Time: 12/28/19  6:57 PM   Specimen: Nasal Mucosa; Nasopharyngeal  Result Value Ref Range Status   MRSA by PCR NEGATIVE NEGATIVE Final    Comment:        The GeneXpert MRSA Assay (FDA approved for NASAL specimens only), is one component of a comprehensive MRSA colonization surveillance program. It is not intended  to diagnose MRSA infection nor to guide or monitor treatment for MRSA infections. Performed at [facility], [address], [place], [place] [address]   Novel Coronavirus, NAA (Hosp order, Send-out to Ref Lab; TAT 18-24 hrs     Status: None   Collection Time: 12/29/19  9:00 AM  Result Value Ref Range Status   SARS-CoV-2, NAA NOT DETECTED NOT DETECTED Final    Comment: (NOTE) This nucleic acid amplification test was developed and its performance characteristics determined by [employer]. Nucleic acid amplification tests include RT-PCR and TMA. This test has not been FDA cleared or approved. This test has been authorized by FDA under an Emergency Use Authorization (EUA). This test is only authorized for the duration of time the declaration that circumstances exist justifying the authorization of the emergency use of in vitro diagnostic tests for detection of SARS-CoV-2 virus and/or diagnosis of COVID-19 infection under section 564(b)(1) of the Act, 21 U.S.C. 258NID-7(O) (1), unless the authorization is terminated or revoked sooner. When diagnostic testing is negative, the possibility of a false negative result should be considered in the context of a patient's recent exposures and the presence of clinical signs and symptoms consistent with COVID-19. An individual without symptoms of COVID- 19 and who is not shedding SARS-CoV-2  virus would expect to have a negative (not detected) result in this assay. Performed At: [facility] [address] [place], [place] [id number] Jolene Schimke MD ER:[id number]    Coronavirus Source NASOPHARYNGEAL  Final    Comment: Performed at [facility], [address] [place], [place] [address]  Body fluid culture     Status: None   Collection Time: 12/30/19  1:02 PM   Specimen: Thoracentesis; Body Fluid  Result Value Ref Range Status   Specimen Description THORACENTESIS  Final   Special Requests NONE  Final   Gram Stain   Final    MODERATE WBC PRESENT,BOTH PMN AND MONONUCLEAR NO ORGANISMS SEEN    Culture   Final    NO GROWTH 3 DAYS Performed at [facility], [address] [place], [place] [address]    Report Status 01/02/2020 FINAL  Final  Body fluid culture     Status: None   Collection Time: 12/30/19  1:02 PM   Specimen: Thoracentesis; Body Fluid  Result Value Ref Range Status   Specimen Description THORACENTESIS  Final   Special Requests Normal  Final   Gram Stain NO WBC SEEN NO ORGANISMS SEEN   Final   Culture   Final    NO GROWTH 3 DAYS Performed at [facility], [address], [place], [place] [address]    Report Status 01/03/2020 FINAL  Final  Respiratory Panel by PCR     Status: Abnormal   Collection Time: 12/31/19  1:32 PM   Specimen: Nasopharyngeal Swab; Respiratory  Result Value Ref Range Status   Adenovirus NOT DETECTED NOT DETECTED Final   Coronavirus 229E NOT DETECTED NOT DETECTED Final    Comment: (NOTE) The Coronavirus on the Respiratory Panel, DOES NOT test for the novel  Coronavirus (2019 nCoV)    Coronavirus HKU1 NOT DETECTED NOT DETECTED Final   Coronavirus NL63 NOT DETECTED NOT DETECTED Final   Coronavirus OC43 DETECTED (A) NOT DETECTED Final   Metapneumovirus NOT DETECTED NOT DETECTED Final   Rhinovirus / Enterovirus NOT DETECTED NOT DETECTED Final   Influenza A NOT DETECTED NOT DETECTED Final   Influenza B NOT DETECTED NOT DETECTED Final  Parainfluenza Virus 1 NOT DETECTED NOT DETECTED Final    Parainfluenza Virus 2 NOT DETECTED NOT DETECTED Final   Parainfluenza Virus 3 NOT DETECTED NOT DETECTED Final   Parainfluenza Virus 4 NOT DETECTED NOT DETECTED Final   Respiratory Syncytial Virus NOT DETECTED NOT DETECTED Final   Bordetella pertussis NOT DETECTED NOT DETECTED Final   Chlamydophila pneumoniae NOT DETECTED NOT DETECTED Final   Mycoplasma pneumoniae NOT DETECTED NOT DETECTED Final    Comment: Performed at [facility], [address] [place], [place] [address]     Labs: Basic Metabolic Panel: Recent Labs  Lab 12/30/19 1125 12/30/19 1636 12/31/19 0559 01/02/20 0401 01/03/20 0514 01/04/20 0625 01/05/20 0455 01/06/20 0510  NA 142  --    < > 142 140 141 142 139  K 3.5  --    < > 3.3* 4.2 3.9 3.7 4.1  CL 114*  --    < > 113* 109 108 107 104  CO2 17*  --    < > 23 25 27 29 28   GLUCOSE 130*  --    < > 147* 208* 69* 28* 185*  BUN 31*  --    < > 10 12 13 12 14   CREATININE 1.18  --    < > 0.81 0.85 0.86 0.95 0.90  CALCIUM 7.8*  --    < > 8.0* 8.1* 8.2* 8.1* 8.3*  MG 1.8 2.3  --   --   --   --   --   --   PHOS 4.6 5.0*  --   --   --   --   --   --    < > = values in this interval not displayed.   Liver Function Tests: Recent Labs  Lab 12/30/19 1206 12/30/19 1636  PROT 6.0* 6.2*   No results for input(s): LIPASE, AMYLASE in the last 168 hours. No results for input(s): AMMONIA in the last 168 hours. CBC: Recent Labs  Lab 12/31/19 0559 01/01/20 0550 01/02/20 0401 01/02/20 2018 01/03/20 0514  WBC 10.4 8.8 9.0 8.5 8.3  NEUTROABS 7.6 6.2 5.5  --  4.9  HGB 10.1* 9.2* 9.3* 9.5* 8.9*  HCT 32.2* 29.9* 29.6* 30.8* 28.9*  MCV 92.5 93.4 93.4 92.8 92.9  PLT 325 324 353 352 339   Cardiac Enzymes: No results for input(s): CKTOTAL, CKMB, CKMBINDEX, TROPONINI in the last 168 hours. BNP: BNP (last 3 results) Recent Labs    10/21/19 1434 12/28/19 0512  BNP 195.6* 544.4*    ProBNP (last 3 results) No results for input(s): PROBNP in the last 8760  hours.  CBG: Recent Labs  Lab 01/05/20 1002 01/05/20 1134 01/05/20 1634 01/05/20 2108 01/06/20 0613  GLUCAP 216* 195* 287* 336* 138*    Signed:  Zannie Cove MD.  [facility] 01/06/2020, 10:05 AM

## 2020-01-06 NOTE — TOC Transition Note (Addendum)
Transition of Care [facility]) - CM/SW Discharge Note   Patient Details  Name: Rodney Barber MRN: [id number] Date of Birth: Jan 24, 1966  Transition of Care [facility]) CM/SW Contact:  Leone Haven, RN Phone Number: 01/06/2020, 9:37 AM   Clinical Narrative:    Patient is for dc today, he wants a cane , NCM made referral to [facility] with [employer] . The cane will be brought up to the room, he has a bus pass for transport.  TOC will fill the medications for him prior to dc.  He has no funds.  Plan is to go to a Shelter. Per Ian Malkin with [employer] he is unable to give patient a cane because he received a cane in Feb under charity.  This information was given to the patient.    Final next level of care: Homeless Shelter Barriers to Discharge: No Barriers Identified   Patient Goals and CMS Choice Patient states their goals for this hospitalization and ongoing recovery are:: get better   Choice offered to / list presented to : NA  Discharge Placement                       Discharge Plan and Services In-house Referral: Financial Counselor Discharge Planning Services: CM Consult Post Acute Care Choice: NA          DME Arranged: Gilmer Mor DME Agency: [employer] Date DME Agency Contacted: 01/06/20 Time DME Agency Contacted: [phone] Representative spoke with at DME Agency: Ian Malkin HH Arranged: NA          Social Determinants of Health (SDOH) Interventions     Readmission Risk Interventions Readmission Risk Prevention Plan 12/31/2019 10/27/2019 06/24/2019  Transportation Screening Complete Complete Complete  Medication Review Oceanographer) Complete Complete Complete  PCP or Specialist appointment within 3-5 days of discharge Complete - Complete  HRI or Home Care Consult Complete Complete Complete  SW Recovery Care/Counseling Consult Complete Complete Complete  Palliative Care Screening - Not Applicable Not Applicable  Skilled Nursing Facility Not Applicable Not Applicable Not Applicable  Some  recent data might be hidden

## 2020-01-07 ENCOUNTER — Encounter: Payer: Self-pay | Admitting: Critical Care Medicine

## 2020-01-07 ENCOUNTER — Telehealth: Payer: Self-pay

## 2020-01-07 NOTE — Progress Notes (Signed)
Patient ID: Rodney Barber, male   DOB: 1966/07/30, 54 y.o.   MRN: [id number]  This is a 54 year old male who was discharged discharge from the hospital for respiratory failure new onset congestive heart failure coronary artery disease treated medically ejection fraction 35% ventilator dependent and underlying diabetes with renal insufficiency  Below is a copy of the discharge summary  Admit date: 12/28/2019 Discharge date: 01/06/2020  Time spent: 35 minutes  Recommendations for Outpatient Follow-up:  [facility], office to arrange follow-up [place] [facility] on 6/15 at 11 AM Please check BMP in 1 week  Discharge Diagnoses:  Acute hypoxic respiratory failure Acute systolic and diastolic CHF, EF of 35% Coronary artery disease Chronic alcoholic pancreatitis EtOH abuse Transudative pleural effusions Type 2 diabetes mellitus on insulin Homelessness   Hyperglycemia   Tobacco abuse   Acute respiratory failure with hypoxia (HCC)   S/P thoracentesis   Acute on chronic combined systolic and diastolic CHF (congestive heart failure) (HCC)   Essential hypertension   Abnormal nuclear stress test   Elevated brain natriuretic peptide (BNP) level   Discharge Condition: Improved  Diet recommendation: Low-sodium, diabetic, heart healthy       Filed Weights   01/04/20 0014 01/05/20 0417 01/06/20 0423  Weight: 54.3 kg 53.3 kg 52.4 kg    History of present illness:  54 yo homeless male with history of alcoholic pancreatitis, htn, DM2 poorly controlled, presented to [facility] 5/22 with CC SOB, cough. In ED, pt hypoxic, SpO2 70s on RA. Initially, placed on NRB , progressive resp distress and hypoxia was intubated in the emergency room.  Hospital Course:   Acute hypoxemic respiratory failure -Acute systolic CHF, EF of 35% lower from prior -Admitted to the ICU with acute hypoxic respiratory failure requiring mechanical intubation, diuresed with IV Lasix, extubated  5/25 -Status post bilateral thoracentesis 5/25, fluid was transudative -Followed by cardiology this admission -Clinically improved with diuresis, work-up noted echo with drop in EF down to 35%, subsequently underwent Lexiscan Myoview which was abnormal -Then had left heart catheterization-noted 70% RCA disease and otherwise nonobstructive CAD, medical management was recommended -Clinically improved and stable now, transition to oral torsemide and Aldactone, also started on carvedilol, losartan -Discharged back to shelter today -Follow-up arranged with [place] [facility] and [facility] care to arrange follow-up as well, needs BMP in 1 week, importance of compliance with medications, salt restriction, diabetic diet and follow-up emphasized  Chronic acoholic pancreatitis  -Resume Creon  COPD/heavy tobacco abuse -Smoking 3 packs/day, counseled -Nebs PRN  DM2brittle -Brittle diabetic, with significant fluctuations in blood sugars noted -Subsequently transitioned to Levemir 15 units in the morning and 12 at bedtime  Homelessness -Followed by social work inpatient, patient is uninsured without family support, he will be discharged back to the shelter at [facility]  Chronic anemia -Stable  H/o HTN -BP stable, monitor  Since discharge the patient has not taken any of his medications and was discharged yesterday  I proceeded to review all his medications with the patient with the nurse in the clinic and we indicated the medicines he should take and actually watched him take his afternoon dose today of all his medications.  The nurse will obtain for him a medication organizer organizes pills.  We also will need to get this patient into assisted living as a homeless shelter is not the proper location to care for this patient we will also get him back in to see me in the clinic on June  9 I will contact his homeless shelter case manager and make sure he follows up with  cardiology.

## 2020-01-07 NOTE — Telephone Encounter (Signed)
Transition Care Management Follow-up Telephone Call Date of discharge and from where:01/06/2020, [facility]   Call placed to patient's number # [phone] and his brother Trey Paula answered.  He said that the patient was still in the hospital. No DPR to speak with Trey Paula.  He has the phone number for the clinic and can have his brother call.   Patient has appointment with Dr [name] 01/20/2020 @ 1100.

## 2020-01-08 LAB — GLUCOSE, CAPILLARY: Glucose-Capillary: 43 mg/dL — CL (ref 70–99)

## 2020-01-08 NOTE — Telephone Encounter (Signed)
Left message for pt to call.

## 2020-01-09 NOTE — Telephone Encounter (Signed)
Called patient to discuss TOC questions.   Left message to call back.

## 2020-01-12 NOTE — Telephone Encounter (Signed)
Called again for [facility] call-  Patients brother (also last name Mancusi) was advised of the appointment information, he helps to care for his brother.  He will tell him to call back to discuss questions.

## 2020-01-14 ENCOUNTER — Other Ambulatory Visit: Payer: Self-pay

## 2020-01-14 ENCOUNTER — Emergency Department (HOSPITAL_COMMUNITY)
Admission: EM | Admit: 2020-01-14 | Discharge: 2020-01-15 | Disposition: A | Discharge status: Home / Self Care | Payer: Self-pay | Attending: Emergency Medicine | Admitting: Emergency Medicine

## 2020-01-14 ENCOUNTER — Encounter: Payer: Self-pay | Admitting: Physician Assistant

## 2020-01-14 ENCOUNTER — Emergency Department (HOSPITAL_COMMUNITY): Payer: Self-pay

## 2020-01-14 ENCOUNTER — Encounter: Payer: Self-pay | Admitting: Critical Care Medicine

## 2020-01-14 ENCOUNTER — Ambulatory Visit (INDEPENDENT_AMBULATORY_CARE_PROVIDER_SITE_OTHER): Payer: Self-pay | Admitting: Physician Assistant

## 2020-01-14 VITALS — BP 120/74 | HR 73 | Temp 97.6°F | Ht 68.0 in | Wt 114.0 lb

## 2020-01-14 DIAGNOSIS — I5042 Chronic combined systolic (congestive) and diastolic (congestive) heart failure: Secondary | ICD-10-CM

## 2020-01-14 DIAGNOSIS — I110 Hypertensive heart disease with heart failure: Secondary | ICD-10-CM | POA: Insufficient documentation

## 2020-01-14 DIAGNOSIS — F17210 Nicotine dependence, cigarettes, uncomplicated: Secondary | ICD-10-CM | POA: Insufficient documentation

## 2020-01-14 DIAGNOSIS — E1165 Type 2 diabetes mellitus with hyperglycemia: Secondary | ICD-10-CM | POA: Insufficient documentation

## 2020-01-14 DIAGNOSIS — R531 Weakness: Secondary | ICD-10-CM | POA: Insufficient documentation

## 2020-01-14 DIAGNOSIS — I5043 Acute on chronic combined systolic (congestive) and diastolic (congestive) heart failure: Secondary | ICD-10-CM | POA: Insufficient documentation

## 2020-01-14 DIAGNOSIS — I2510 Atherosclerotic heart disease of native coronary artery without angina pectoris: Secondary | ICD-10-CM | POA: Insufficient documentation

## 2020-01-14 DIAGNOSIS — E119 Type 2 diabetes mellitus without complications: Secondary | ICD-10-CM

## 2020-01-14 DIAGNOSIS — R739 Hyperglycemia, unspecified: Secondary | ICD-10-CM

## 2020-01-14 DIAGNOSIS — Z79899 Other long term (current) drug therapy: Secondary | ICD-10-CM

## 2020-01-14 DIAGNOSIS — Z794 Long term (current) use of insulin: Secondary | ICD-10-CM | POA: Insufficient documentation

## 2020-01-14 DIAGNOSIS — E785 Hyperlipidemia, unspecified: Secondary | ICD-10-CM

## 2020-01-14 DIAGNOSIS — I10 Essential (primary) hypertension: Secondary | ICD-10-CM

## 2020-01-14 LAB — URINALYSIS, ROUTINE W REFLEX MICROSCOPIC
Bilirubin Urine: NEGATIVE
Glucose, UA: >=500 mg/dL — AB
Ketones, ur: NEGATIVE mg/dL
Nitrite: NEGATIVE
Protein, ur: 30 mg/dL — AB
RBC / HPF: >50 RBC/hpf — ABNORMAL HIGH (ref 0–5)
Specific Gravity, Urine: 1.003 — ABNORMAL LOW (ref 1.005–1.030)
WBC, UA: >50 WBC/hpf — ABNORMAL HIGH (ref 0–5)
pH: 5 (ref 5.0–8.0)

## 2020-01-14 LAB — COMPREHENSIVE METABOLIC PANEL
ALT: 26 U/L (ref 0–44)
AST: 21 U/L (ref 15–41)
Albumin: 2.9 g/dL — ABNORMAL LOW (ref 3.5–5.0)
Alkaline Phosphatase: 125 U/L (ref 38–126)
Anion gap: 8 (ref 5–15)
BUN: 41 mg/dL — ABNORMAL HIGH (ref 6–20)
CO2: 28 mmol/L (ref 22–32)
Calcium: 7.7 mg/dL — ABNORMAL LOW (ref 8.9–10.3)
Chloride: 95 mmol/L — ABNORMAL LOW (ref 98–111)
Creatinine, Ser: 1.49 mg/dL — ABNORMAL HIGH (ref 0.61–1.24)
GFR calc Af Amer: >60 mL/min (ref >60)
GFR calc non Af Amer: 52 mL/min — ABNORMAL LOW (ref >60)
Glucose, Bld: 437 mg/dL — ABNORMAL HIGH (ref 70–99)
Potassium: 3.2 mmol/L — ABNORMAL LOW (ref 3.5–5.1)
Sodium: 131 mmol/L — ABNORMAL LOW (ref 135–145)
Total Bilirubin: 0.4 mg/dL (ref 0.3–1.2)
Total Protein: 5.9 g/dL — ABNORMAL LOW (ref 6.5–8.1)

## 2020-01-14 LAB — BLOOD GAS, VENOUS
Acid-base deficit: 2.1 mmol/L — ABNORMAL HIGH (ref 0.0–2.0)
Bicarbonate: 24.1 mmol/L (ref 20.0–28.0)
O2 Saturation: 37.1 %
Patient temperature: 98.6
pCO2, Ven: 51.1 mmHg (ref 44.0–60.0)
pH, Ven: 7.295 (ref 7.250–7.430)
pO2, Ven: <31 mmHg — CL (ref 32.0–45.0)

## 2020-01-14 LAB — CBC WITH DIFFERENTIAL/PLATELET
Abs Immature Granulocytes: 0.05 10*3/uL (ref 0.00–0.07)
Basophils Absolute: 0.1 10*3/uL (ref 0.0–0.1)
Basophils Relative: 1 %
Eosinophils Absolute: 0.2 10*3/uL (ref 0.0–0.5)
Eosinophils Relative: 2 %
HCT: 28.6 % — ABNORMAL LOW (ref 39.0–52.0)
Hemoglobin: 9.3 g/dL — ABNORMAL LOW (ref 13.0–17.0)
Immature Granulocytes: 1 %
Lymphocytes Relative: 22 %
Lymphs Abs: 1.8 10*3/uL (ref 0.7–4.0)
MCH: 29.2 pg (ref 26.0–34.0)
MCHC: 32.5 g/dL (ref 30.0–36.0)
MCV: 89.9 fL (ref 80.0–100.0)
Monocytes Absolute: 0.5 10*3/uL (ref 0.1–1.0)
Monocytes Relative: 6 %
Neutro Abs: 5.4 10*3/uL (ref 1.7–7.7)
Neutrophils Relative %: 68 %
Platelets: 282 10*3/uL (ref 150–400)
RBC: 3.18 MIL/uL — ABNORMAL LOW (ref 4.22–5.81)
RDW: 14.2 % (ref 11.5–15.5)
WBC: 8 10*3/uL (ref 4.0–10.5)
nRBC: 0 % (ref 0.0–0.2)

## 2020-01-14 LAB — CBG MONITORING, ED
Glucose-Capillary: 486 mg/dL — ABNORMAL HIGH (ref 70–99)
Glucose-Capillary: 65 mg/dL — ABNORMAL LOW (ref 70–99)
Glucose-Capillary: 54 mg/dL — ABNORMAL LOW (ref 70–99)

## 2020-01-14 MED ORDER — SODIUM CHLORIDE 0.9 % IV BOLUS
500.0000 mL | Freq: Once | INTRAVENOUS | Status: AC
  Administered 2020-01-14: 500 mL via INTRAVENOUS

## 2020-01-14 MED ORDER — INSULIN ASPART 100 UNIT/ML ~~LOC~~ SOLN
10.0000 [IU] | Freq: Once | SUBCUTANEOUS | Status: AC
  Administered 2020-01-14: 10 [IU] via SUBCUTANEOUS
  Filled 2020-01-14: qty 0.1

## 2020-01-14 NOTE — Patient Instructions (Addendum)
Medication Instructions:  Your physician recommends that you continue on your current medications as directed. Please refer to the Current Medication list given to you today.  *If you need a refill on your cardiac medications before your next appointment, please call your pharmacy*  Lab Work: Your physician recommends that you return for lab work TODAY:   BMET  Your physician recommends that you return for lab work on the day of your follow up in 2-3 months:  FASTING LIPID PANEL-DO NOT EAT OR DRINK PAST MIDNIGHT. OKAY TO HAVE WATER.  HEPATIC (LIVER) FUNCTION TEST  If you have labs (blood work) drawn today and your tests are completely normal, you will receive your results only by: Marland Kitchen MyChart Message (if you have MyChart) OR . A paper copy in the mail If you have any lab test that is abnormal or we need to change your treatment, we will call you to review the results.  Testing/Procedures: NONE ordered at this time of appointment   Follow-Up: At [facility], you and your health needs are our priority.  As part of our continuing mission to provide you with exceptional heart care, we have created designated Provider Care Teams.  These Care Teams include your primary Cardiologist (physician) and Advanced Practice Providers (APPs -  Physician Assistants and Nurse Practitioners) who all work together to provide you with the care you need, when you need it.  We recommend signing up for the patient portal called "MyChart".  Sign up information is provided on this After Visit Summary.  MyChart is used to connect with patients for Virtual Visits (Telemedicine).  Patients are able to view lab/test results, encounter notes, upcoming appointments, etc.  Non-urgent messages can be sent to your provider as well.   To learn more about what you can do with MyChart, go to [email or web address].    Your next appointment:   2-3 month(s)  The format for your next appointment:   In Person  Provider:    [name], MD  Other Instructions

## 2020-01-14 NOTE — ED Notes (Signed)
Date and time results received: 01/14/20 8:51 PM  (use smartphrase ".now" to insert current time)  Test: PO2 Critical Value: less than 31.0  Name of Provider Notified: [name], MD  Orders Received? Or Actions Taken?:

## 2020-01-14 NOTE — ED Triage Notes (Signed)
PER EMS: [facility] hyperglycemia and weakness. States his last meal was at breakfast time and he has been outside all day in the heat. Patient had an initial sugar of 598 and was administered 500cc of fluids with EMS. Consistent with medication metformin and insulin. A&Ox4 and ambulatory with walker.  EMS VITALS: BP 122/60 HR 72 SPO2 98% RA  18 LAC

## 2020-01-14 NOTE — ED Notes (Signed)
Patient ambulated to rest room with walker, gait steady

## 2020-01-14 NOTE — ED Provider Notes (Signed)
[name] DEPT Provider Note   CSN: [id number] Arrival date & time: 01/14/20  1928     History Chief Complaint  Patient presents with  . Hyperglycemia  . Weakness    Rodney Barber is a 54 y.o. male.  Past medical history diabetes, hypertension, CAD, heart failure with reduced ejection fraction followed closely by cardiology presents to ER with concern for generalized weakness, elevated blood sugar.  Patient states that he has had increased fatigue, decreased energy level.  States that his sugars have been running high lately.  Reports taking his insulin as prescribed by PCP.  States that he wanted to discuss today going to the doctor walking.  He denies any chest pain or difficulty breathing  Complete chart reviewed, noted recent cardiology note from earlier in the day, noted recent PCP visit.  Complex hospitalization admitted due to respiratory failure, heart failure requiring intubation.  HPI     Past Medical History:  Diagnosis Date  . Diabetes mellitus   . Hypertension   . Pancreatitis     Patient Active Problem List   Diagnosis Date Noted  . Elevated brain natriuretic peptide (BNP) level   . Abnormal nuclear stress test   . S/P thoracentesis   . Acute on chronic combined systolic and diastolic CHF (congestive heart failure) ([place])   . Essential hypertension   . Acute respiratory failure with hypoxia ([place]) 12/28/2019  . Transaminitis 09/10/2019  . Hyperglycemia 05/14/2019  . Tobacco abuse 05/14/2019  . Substance use disorder 02/18/2019  . Uncontrolled type 2 diabetes mellitus ([place]) 03/15/2018  . 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 Severe malnutrition ([place]) 07/11/2013  . Protein-calorie malnutrition, severe ([place]) 07/11/2013    Past Surgical History:  Procedure Laterality Date  . ERCP  06/27/2011   Procedure: ENDOSCOPIC RETROGRADE CHOLANGIOPANCREATOGRAPHY (ERCP);  Surgeon: Jeryl Columbia, MD;  Location: [facility]  ENDOSCOPY;  Service: Endoscopy;  Laterality: N/A;  . ERCP W/ PLASTIC STENT PLACEMENT  03/2011  . IR US GUIDE BX ASP/DRAIN  05/16/2019  . LEFT HEART CATH AND CORONARY ANGIOGRAPHY N/A 01/02/2020   Procedure: LEFT HEART CATH AND CORONARY ANGIOGRAPHY;  Surgeon: Martinique, Peter M, MD;  Location: [facility];  Service: Cardiovascular;  Laterality: N/A;  . TEE WITHOUT CARDIOVERSION N/A 05/20/2019   Procedure: TRANSESOPHAGEAL ECHOCARDIOGRAM (TEE);  Surgeon: [name], MD;  Location: [facility] ENDOSCOPY;  Service: Cardiovascular;  Laterality: N/A;       Family History  Problem Relation Age of Onset  . Diabetes Mother   . Diabetes Brother     Social History   Tobacco Use  . Smoking status: Current Every Day Smoker    Packs/day: 0.50    Years: 30.00    Pack years: 15.00    Types: Cigarettes  . Smokeless tobacco: Never Used  . Tobacco comment: 6 cigarett /day  Vaping Use  . Vaping Use: Never used  Substance Use Topics  . Alcohol use: No  . Drug use: Not Currently    Types: "Crack" cocaine, Other-see comments, Cocaine    Comment: last smoked crack 12-16-16    Home Medications Prior to Admission medications   Medication Sig Start Date End Date Taking? Authorizing Provider  aspirin 81 MG chewable tablet Chew 1 tablet (81 mg total) by mouth daily. 01/07/20   Domenic Polite, MD  atorvastatin (LIPITOR) 80 MG tablet Take 1 tablet (80 mg total) by mouth daily at 6 PM. 01/06/20 02/05/20  Domenic Polite, MD  blood glucose meter kit and  supplies KIT Dispense based on patient and insurance preference. Use up to four times daily as directed. (FOR ICD-9 250.00, 250.01). 11/15/19   Little Ishikawa, MD  carvedilol (COREG) 25 MG tablet Take 1 tablet (25 mg total) by mouth 2 (two) times daily with a meal. 01/06/20   Domenic Polite, MD  feeding supplement, GLUCERNA SHAKE, (GLUCERNA SHAKE) LIQD Take 237 mLs by mouth 3 (three) times daily between meals. 09/11/19   Mariel Aloe, MD  insulin detemir  (LEVEMIR FLEXTOUCH) 100 UNIT/ML FlexPen Inject 12-15 Units into the skin 2 (two) times daily. Take 15units in am and 12units in pm 01/06/20   Domenic Polite, MD  lipase/protease/amylase (CREON) 12000-38000 units CPEP capsule Take 1 capsule (12,000 Units total) by mouth 3 (three) times daily with meals. 01/06/20   Domenic Polite, MD  losartan (COZAAR) 100 MG tablet Take 1 tablet (100 mg total) by mouth daily. 01/07/20   Domenic Polite, MD  pantoprazole (PROTONIX) 40 MG tablet Take 1 tablet (40 mg total) by mouth daily. 01/07/20   Domenic Polite, MD  spironolactone (ALDACTONE) 25 MG tablet Take 0.5 tablets (12.5 mg total) by mouth daily. 01/07/20   Domenic Polite, MD  torsemide (DEMADEX) 20 MG tablet Take 1 tablet (20 mg total) by mouth daily. 01/07/20   Domenic Polite, MD    Allergies    Patient has no known allergies.  Review of Systems   Review of Systems  Constitutional: Positive for fatigue. Negative for chills and fever.  HENT: Negative for ear pain and sore throat.   Eyes: Negative for pain and visual disturbance.  Respiratory: Negative for cough and shortness of breath.   Cardiovascular: Negative for chest pain and palpitations.  Gastrointestinal: Negative for abdominal pain and vomiting.  Genitourinary: Negative for dysuria and hematuria.  Musculoskeletal: Negative for arthralgias and back pain.  Skin: Negative for color change and rash.  Neurological: Negative for seizures and syncope.  All other systems reviewed and are negative.   Physical Exam Updated Vital Signs BP 104/64   Pulse 72   Temp 97.6 F (36.4 C) (Oral)   Resp 16   SpO2 100%   Physical Exam Vitals and nursing note reviewed.  Constitutional:      Appearance: He is well-developed.  HENT:     Head: Normocephalic and atraumatic.  Eyes:     Conjunctiva/sclera: Conjunctivae normal.  Cardiovascular:     Rate and Rhythm: Normal rate and regular rhythm.     Heart sounds: No murmur heard.   Pulmonary:     Effort:  Pulmonary effort is normal. No respiratory distress.     Breath sounds: Normal breath sounds.  Abdominal:     Palpations: Abdomen is soft.     Tenderness: There is no abdominal tenderness.  Musculoskeletal:        General: No deformity or signs of injury.     Cervical back: Neck supple.     Right lower leg: No edema.     Left lower leg: No edema.  Skin:    General: Skin is warm and dry.     Capillary Refill: Capillary refill takes less than 2 seconds.  Neurological:     General: No focal deficit present.     Mental Status: He is alert and oriented to person, place, and time.     ED Results / Procedures / Treatments   Labs (all labs ordered are listed, but only abnormal results are displayed) Labs Reviewed  CBC WITH DIFFERENTIAL/PLATELET - Abnormal; Notable  for the following components:      Result Value   RBC 3.18 (*)    Hemoglobin 9.3 (*)    HCT 28.6 (*)    All other components within normal limits  URINALYSIS, ROUTINE W REFLEX MICROSCOPIC - Abnormal; Notable for the following components:   Specific Gravity, Urine 1.003 (*)    Glucose, UA >=500 (*)    Hgb urine dipstick LARGE (*)    Protein, ur 30 (*)    Leukocytes,Ua LARGE (*)    RBC / HPF >50 (*)    WBC, UA >50 (*)    Bacteria, UA RARE (*)    All other components within normal limits  COMPREHENSIVE METABOLIC PANEL - Abnormal; Notable for the following components:   Sodium 131 (*)    Potassium 3.2 (*)    [place] 95 (*)    Glucose, Bld 437 (*)    BUN 41 (*)    Creatinine, Ser 1.49 (*)    Calcium 7.7 (*)    Total Protein 5.9 (*)    Albumin 2.9 (*)    GFR calc non Af Amer 52 (*)    All other components within normal limits  BLOOD GAS, VENOUS - Abnormal; Notable for the following components:   pO2, Ven <31.0 (*)    Acid-base deficit 2.1 (*)    All other components within normal limits  CBG MONITORING, ED - Abnormal; Notable for the following components:   Glucose-Capillary 486 (*)    All other components within  normal limits  CBG MONITORING, ED - Abnormal; Notable for the following components:   Glucose-Capillary 65 (*)    All other components within normal limits  CBG MONITORING, ED - Abnormal; Notable for the following components:   Glucose-Capillary 54 (*)    All other components within normal limits  CBG MONITORING, ED - Abnormal; Notable for the following components:   Glucose-Capillary 161 (*)    All other components within normal limits    EKG None  Radiology DG Chest 2 View  Result Date: 01/14/2020 CLINICAL DATA:  Generalized weakness. History of heart failure. EXAM: CHEST - 2 VIEW COMPARISON:  Most recent radiograph 12/30/2019. CT 12/28/2019 FINDINGS: Upper normal heart size. Normal mediastinal contours. Aortic atherosclerosis. No residual pleural effusion. No pulmonary edema, pneumothorax, or confluent airspace disease. No acute osseous abnormalities are seen. IMPRESSION: 1. Upper normal heart size. Previous pleural effusions have resolved. No evidence of congestive failure. 2. No acute findings. Electronically Signed   By: Keith Rake M.D.   On: 01/14/2020 21:28    Procedures Procedures (including critical care time)  Medications Ordered in ED Medications  insulin aspart (novoLOG) injection 10 Units (10 Units Subcutaneous Given 01/14/20 2025)  sodium chloride 0.9 % bolus 500 mL (0 mLs Intravenous Stopped 01/14/20 2316)    ED Course  I have reviewed the triage vital signs and the nursing notes.  Pertinent labs & imaging results that were available during my care of the patient were reviewed by me and considered in my medical decision making (see chart for details).    MDM Rules/Calculators/A&P                      54 year old male heart failure with reduced ejection fraction, diabetes, recent hospitalization for heart failure, respiratory failure presents to ER with concern for elevated blood sugars.  Patient otherwise asymptomatic, initial vital signs stable.  Initial sugar  in ER.  Elevated.  Patient not in DKA per chemistry, VBG.  BMP noted small elevation in his creatinine from baseline.  Also noted slight hypokalemia.  Patient had one documented hypotensive BP in ER.  He was otherwise asymptomatic, very tenuous fluid status based on chart review.  Based on his chemistry, suspect patient may be somewhat dehydrated.  Provided small fluid (500 cc). Defer diuretic therapy to cards - they plan to cut torsemide dose in half. Noted slight hypokalemia. Likely form diuresis/dehydration. Provided supplementation in ER; stressed need for repeat BMP within one week to have his Cr and electrolytes monitored closely. Regarding hyperglycemia, had initially given one dose of subcutaneous insulin. Repeat blood sugar patient had dropped down to 60s. He was provided PO and his glu improved. Pt remained asymptomatic, vitals currently wnl. At this time believe patient can be managed in out pt setting. Given his very brittle diabetic and fluid state, I will defer insulin regimen and diuretic regimen to cardiology, both of whom have been following closely.  Discussed strict return precautions, will dc home.     After the discussed management above, the patient was determined to be safe for discharge.  The patient was in agreement with this plan and all questions regarding their care were answered.  ED return precautions were discussed and the patient will return to the ED with any significant worsening of condition.   Final Clinical Impression(s) / ED Diagnoses Final diagnoses:  Generalized weakness  Hyperglycemia    Rx / DC Orders ED Discharge Orders    None       Lucrezia Starch, MD 01/15/20 1154

## 2020-01-14 NOTE — ED Notes (Signed)
Pt ambulated to the restroom with personal walker with no assistance. Gait steady!

## 2020-01-14 NOTE — ED Notes (Signed)
Pt was given a cup for urine sample, pt did not get a sample while in the restroom

## 2020-01-14 NOTE — Progress Notes (Signed)
Cardiology Office Note:    Date:  01/16/2020   ID:  Rodney Barber, DOB 03-Dec-1965, MRN [id number]  PCP:  Elsie Stain, MD  [facility] Cardiologist:  Peter Martinique, MD  [facility] Electrophysiologist:  None   Referring MD: Elsie Stain, MD   Chief Complaint  Patient presents with  . Follow-up    TOC/CHF  . Pain    In hius ankles.    History of Present Illness:    Rodney Barber is a 54 y.o. male with a hx of uncontrolled DM, chronic pancreatitis, hypertension, tobacco abuse, noncompliance and the homelessness.  Previous echocardiogram obtained in October 2020 showed EF 60 to 65%, pericardial effusion posterior to the left ventricle. Repeat echocardiogram in March 2021 showed EF 40 to 45%, grade 1 DD, small filamentous mobile echodensity unchanged from the previous TEE.  Recently, patient was admitted in May due to worsening shortness of breath.  Echocardiogram obtained in the hospital demonstrated new LV dysfunction with EF of 35 to 40%.  BNP was 550.  CT showed bilateral pleural effusion and coronary artery calcification.  He was treated for acute on chronic combined systolic and diastolic heart failure using IV Lasix.  Myoview performed on 01/01/2020 was high risk with large area of defect in the basal inferolateral, mid anteroseptal, mid inferolateral and apical septal location consistent with previous MI with peri-infarct ischemia.  He subsequently underwent cardiac catheterization on the following day on 5/28 which showed 30% proximal to distal LAD lesion, 60% mid to distal left circumflex lesion, 70% mid RCA lesion, EF was 25 to 35% by LV gram.  He had elevated LVEDP of 24 mmHg suggestive of volume overload.  His LV dysfunction was felt to be out of proportion to the degree of CAD, medical therapy was recommended for nonischemic cardiomyopathy.  He was placed on carvedilol, losartan, and Aldactone.  He was eventually discharged to [place] on 20 mg daily of  torsemide.  Patient presents today for follow-up.  He denies significant shortness of breath however he does have nasal drainage.  On physical exam, he appears to be euvolemic.  He says he has been compliant with medications which [facility].  He has a follow-up next week with a primary care provider over there.  I plan to obtain a basic metabolic panel today to make sure his renal function and electrolyte are stable on the current diuretic.  He is also due for fasting lipid panel and fatigue in 6 to 8 weeks, this can be done on the next follow-up.  Otherwise, he likely will need a repeat echocardiogram after the next follow-up, I will defer this decision to Dr. Martinique.     Past Medical History:  Diagnosis Date  . Diabetes mellitus   . Hypertension   . Pancreatitis     Past Surgical History:  Procedure Laterality Date  . ERCP  06/27/2011   Procedure: ENDOSCOPIC RETROGRADE CHOLANGIOPANCREATOGRAPHY (ERCP);  Surgeon: Jeryl Columbia, MD;  Location: [facility];  Service: Endoscopy;  Laterality: N/A;  . ERCP W/ PLASTIC STENT PLACEMENT  03/2011  . IR US GUIDE BX ASP/DRAIN  05/16/2019  . LEFT HEART CATH AND CORONARY ANGIOGRAPHY N/A 01/02/2020   Procedure: LEFT HEART CATH AND CORONARY ANGIOGRAPHY;  Surgeon: Martinique, Peter M, MD;  Location: [facility];  Service: Cardiovascular;  Laterality: N/A;  . TEE WITHOUT CARDIOVERSION N/A 05/20/2019   Procedure: TRANSESOPHAGEAL ECHOCARDIOGRAM (TEE);  Surgeon: [name], MD;  Location: [facility];  Service: Cardiovascular;  Laterality: N/A;    Current Medications: Current Meds  Medication Sig  . aspirin 81 MG chewable tablet Chew 1 tablet (81 mg total) by mouth daily.  Marland Kitchen atorvastatin (LIPITOR) 80 MG tablet Take 1 tablet (80 mg total) by mouth daily at 6 PM.  . blood glucose meter kit and supplies KIT Dispense based on patient and insurance preference. Use up to four times daily as directed. (FOR ICD-9 250.00,  250.01).  . carvedilol (COREG) 25 MG tablet Take 1 tablet (25 mg total) by mouth 2 (two) times daily with a meal.  . feeding supplement, GLUCERNA SHAKE, (GLUCERNA SHAKE) LIQD Take 237 mLs by mouth 3 (three) times daily between meals.  . insulin detemir (LEVEMIR FLEXTOUCH) 100 UNIT/ML FlexPen Inject 12-15 Units into the skin 2 (two) times daily. Take 15units in am and 12units in pm  . lipase/protease/amylase (CREON) 12000-38000 units CPEP capsule Take 1 capsule (12,000 Units total) by mouth 3 (three) times daily with meals.  Marland Kitchen losartan (COZAAR) 100 MG tablet Take 1 tablet (100 mg total) by mouth daily.  . pantoprazole (PROTONIX) 40 MG tablet Take 1 tablet (40 mg total) by mouth daily.  Marland Kitchen spironolactone (ALDACTONE) 25 MG tablet Take 0.5 tablets (12.5 mg total) by mouth daily.  Marland Kitchen torsemide (DEMADEX) 20 MG tablet Take 1 tablet (20 mg total) by mouth daily. (Patient taking differently: Take 10 mg by mouth daily. )     Allergies:   Patient has no known allergies.   Social History   Socioeconomic History  . Marital status: Single    Spouse name: Not on file  . Number of children: Not on file  . Years of education: Not on file  . Highest education level: Not on file  Occupational History  . Not on file  Tobacco Use  . Smoking status: Current Every Day Smoker    Packs/day: 0.50    Years: 30.00    Pack years: 15.00    Types: Cigarettes  . Smokeless tobacco: Never Used  . Tobacco comment: 6 cigarett /day  Vaping Use  . Vaping Use: Never used  Substance and Sexual Activity  . Alcohol use: No  . Drug use: Not Currently    Types: "Crack" cocaine, Other-see comments, Cocaine    Comment: last smoked crack 12-16-16  . Sexual activity: Never  Other Topics Concern  . Not on file  Social History Narrative  . Not on file   Social Determinants of Health   Financial Resource Strain:   . Difficulty of Paying Living Expenses:   Food Insecurity:   . Worried About Charity fundraiser in the Last  Year:   . Arboriculturist in the Last Year:   Transportation Needs:   . Film/video editor (Medical):   Marland Kitchen Lack of Transportation (Non-Medical):   Physical Activity:   . Days of Exercise per Week:   . Minutes of Exercise per Session:   Stress:   . Feeling of Stress :   Social Connections:   . Frequency of Communication with Friends and Family:   . Frequency of Social Gatherings with Friends and Family:   . Attends Religious Services:   . Active Member of Clubs or Organizations:   . Attends Archivist Meetings:   Marland Kitchen Marital Status:      Family History: The patient's family history includes Diabetes in his brother and mother.  ROS:   Please see the history of present  illness.     All other systems reviewed and are negative.  EKGs/Labs/Other Studies Reviewed:    The following studies were reviewed today:  LHC 01/02/2020  Prox LAD to Dist LAD lesion is 30% stenosed.  Mid Cx to Dist Cx lesion is 60% stenosed.  Mid RCA lesion is 70% stenosed.  There is severe left ventricular systolic dysfunction.  LV end diastolic pressure is mildly elevated.  The left ventricular ejection fraction is 25-35% by visual estimate.  1. Diffuse coronary atherosclerosis with heavy calcification. 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 Severe LV dysfunction EF estimated at 25-30% 3. Elevated EDP 24 mm Hg  Plan: aggressive medical therapy for CHF. Will add aldactone to Coreg, lasix, and losartan. Titrate as BP allows. High dose statin therapy. LV dysfunction is out of proportion to the degree of CAD.  TTE 12/28/2019 1. Left ventricular ejection fraction, by estimation, is 35 to 40%. The  left ventricle has moderately decreased function. The left ventricle  demonstrates global hypokinesis. There is moderate concentric left  ventricular hypertrophy. Left ventricular  diastolic function could not be evaluated.  2. Right ventricular systolic function is  normal. The right ventricular  size is normal. There is normal pulmonary artery systolic pressure.  3. The mitral valve is normal in structure. Trivial mitral valve  regurgitation.  4. The aortic valve is tricuspid. Aortic valve regurgitation is not  visualized.  5. The inferior vena cava is dilated in size with >50% respiratory  variability, suggesting right atrial pressure of 8 mmHg.  EKG:  EKG is not ordered today.    Recent Labs: 10/21/2019: TSH 2.097 12/28/2019: B Natriuretic Peptide 544.4 12/30/2019: Magnesium 2.3 01/14/2020: ALT 26; BUN 41; Creatinine, Ser 1.49; Hemoglobin 9.3; Platelets 282; Potassium 3.2; Sodium 131  Recent Lipid Panel    Component Value Date/Time   CHOL 131 01/01/2020 0550   CHOL 135 04/23/2017 1052   TRIG 99 01/01/2020 0550   HDL 35 (L) 01/01/2020 0550   HDL 47 04/23/2017 1052   CHOLHDL 3.7 01/01/2020 0550   VLDL 20 01/01/2020 0550   LDLCALC 76 01/01/2020 0550   LDLCALC 78 04/23/2017 1052    Physical Exam:    VS:  BP 120/74 (BP Location: Left Arm, Patient Position: Sitting, Cuff Size: Normal)   Pulse 73   Temp 97.6 F (36.4 C)   Ht _0  (1.727 m)   Wt 114 lb (51.7 kg)   BMI 17.33 kg/m     Wt Readings from Last 3 Encounters:  01/14/20 114 lb (51.7 kg)  01/06/20 115 lb 9.6 oz (52.4 kg)  12/10/19 137 lb (62.1 kg)     GEN:  Well nourished, well developed in no acute distress HEENT: Normal NECK: No JVD; No carotid bruits LYMPHATICS: No lymphadenopathy CARDIAC: RRR, no murmurs, rubs, gallops RESPIRATORY:  Clear to auscultation without rales, wheezing or rhonchi  ABDOMEN: Soft, non-tender, non-distended MUSCULOSKELETAL:  No edema; No deformity  SKIN: Warm and dry NEUROLOGIC:  Alert and oriented x 3 PSYCHIATRIC:  Normal affect   ASSESSMENT:    1. Chronic combined systolic and diastolic heart failure (Rodney Barber)   2. Hyperlipidemia, unspecified hyperlipidemia type   3. Medication management   4. Coronary artery disease involving native  coronary artery of native heart without angina pectoris   5. Controlled type 2 diabetes mellitus without complication, unspecified whether long term insulin use (Rodney Barber)   6. Essential hypertension    PLAN:    In order of problems listed above:  1. Chronic combined systolic and diastolic heart failure: Euvolemic on physical exam.  Obtain basic metabolic panel to assess renal function and electrolyte.  2. CAD: Recent cardiac catheterization showed moderate disease, cardiomyopathy is likely nonischemic in nature.  On aspirin and Lipitor  3. Hyperlipidemia: Continue Lipitor 80 mg daily.  Will need repeat fasting lipid panel and LFT in 2 months  4. DM2: Uncontrolled, will need to follow-up with primary care doctor closely next week  5. Hypertension: Stable on current therapy.   Medication Adjustments/Labs and Tests Ordered: Current medicines are reviewed at length with the patient today.  Concerns regarding medicines are outlined above.  Orders Placed This Encounter  Procedures  . Basic metabolic panel  . Hepatic function panel  . Lipid panel   No orders of the defined types were placed in this encounter.   Patient Instructions  Medication Instructions:  Your physician recommends that you continue on your current medications as directed. Please refer to the Current Medication list given to you today.  *If you need a refill on your cardiac medications before your next appointment, please call your pharmacy*  Lab Work: Your physician recommends that you return for lab work TODAY:   BMET  Your physician recommends that you return for lab work on the day of your follow up in 2-3 months:  FASTING LIPID PANEL-DO NOT EAT OR DRINK PAST MIDNIGHT. OKAY TO HAVE WATER.  HEPATIC (LIVER) FUNCTION TEST  If you have labs (blood work) drawn today and your tests are completely normal, you will receive your results only by: Marland Kitchen MyChart Message (if you have MyChart) OR . A paper copy in the  mail If you have any lab test that is abnormal or we need to change your treatment, we will call you to review the results.  Testing/Procedures: NONE ordered at this time of appointment   Follow-Up: At [facility], you and your health needs are our priority.  As part of our continuing mission to provide you with exceptional heart care, we have created designated Provider Care Teams.  These Care Teams include your primary Cardiologist (physician) and Advanced Practice Providers (APPs -  Physician Assistants and Nurse Practitioners) who all work together to provide you with the care you need, when you need it.  We recommend signing up for the patient portal called "MyChart".  Sign up information is provided on this After Visit Summary.  MyChart is used to connect with patients for Virtual Visits (Telemedicine).  Patients are able to view lab/test results, encounter notes, upcoming appointments, etc.  Non-urgent messages can be sent to your provider as well.   To learn more about what you can do with MyChart, go to [email or web address].    Your next appointment:   2-3 month(s)  The format for your next appointment:   In Person  Provider:   Peter Martinique, MD  Other Instructions      Signed, Almyra Deforest, [place]  01/16/2020 11:16 PM    [place]

## 2020-01-15 ENCOUNTER — Telehealth: Payer: Self-pay | Admitting: Internal Medicine

## 2020-01-15 LAB — BASIC METABOLIC PANEL
BUN/Creatinine Ratio: 25 — ABNORMAL HIGH (ref 9–20)
BUN: 38 mg/dL — ABNORMAL HIGH (ref 6–24)
CO2: 22 mmol/L (ref 20–29)
Calcium: 9 mg/dL (ref 8.7–10.2)
Chloride: 86 mmol/L — ABNORMAL LOW (ref 96–106)
Creatinine, Ser: 1.5 mg/dL — ABNORMAL HIGH (ref 0.76–1.27)
GFR calc Af Amer: 60 mL/min/{1.73_m2} (ref >59)
GFR calc non Af Amer: 52 mL/min/{1.73_m2} — ABNORMAL LOW (ref >59)
Glucose: 771 mg/dL (ref 65–99)
Potassium: 5.1 mmol/L (ref 3.5–5.2)
Sodium: 127 mmol/L — ABNORMAL LOW (ref 134–144)

## 2020-01-15 LAB — CBG MONITORING, ED: Glucose-Capillary: 161 mg/dL — ABNORMAL HIGH (ref 70–99)

## 2020-01-15 MED ORDER — POTASSIUM CHLORIDE CRYS ER 20 MEQ PO TBCR: 40.0000 meq | EXTENDED_RELEASE_TABLET | Freq: Once | ORAL | Status: DC

## 2020-01-15 MED ORDER — POTASSIUM CHLORIDE CRYS ER 20 MEQ PO TBCR
40.0000 meq | EXTENDED_RELEASE_TABLET | Freq: Two times a day (BID) | ORAL | Status: DC
  Filled 2020-01-15: qty 2

## 2020-01-15 NOTE — Progress Notes (Signed)
Blood sugar very high, this was addressed in the ED, he also appears to be dry, reduce torsemide to half a tablet per day and repeat BMET in 1 week

## 2020-01-15 NOTE — Telephone Encounter (Signed)
Received a call from [facility] regarding a glucose of 771 on outpatient labs. On review of his chart, he was seen in the ER on the evening of 6/9 after his blood work was done with improvement in his glucose on repeat testing to 437. Will update Azalee Course on the results.  Feliberto Harts, MD Cardiology [place]

## 2020-01-15 NOTE — Discharge Instructions (Addendum)
Hold insulin for tonight, can resume your regular insulin regimen tomorrow.  Please schedule follow-up appointment with your primary doctor, ideally to be seen this week or early next week.  Please discuss your symptoms from today.  If you develop chest pain, difficulty breathing, vomiting, lightheadedness, other new concerning symptom, return to ER for reassessment.  Your blood work showed that you may be slightly dehydrated as well as you have mildly low potassium.  Please see extra banana daily and have either your cardiologist or your primary doctor repeat your blood work within the next few days.

## 2020-01-15 NOTE — Progress Notes (Signed)
Patient ID: Rodney Barber, male   DOB: 03-18-1966, 54 y.o.   MRN: [id number] This is 54 year old male who has chronic pancreatic inflammation type 2 diabetes renal insufficiency chronic recurrent abdominal pain and recurrent hospitalizations for diabetes out-of-control he is seen 1 week after having been just discharged from the hospital and we did a post hospital visit last week  Note the patient does have another maternal vaccine due this week and had his previous vaccine his first 1 on May 13  Patient has missed some of his doses of the medications we have now produced for him a pill organizer and instructed on the proper use of all his medications no dose changes are needed he continues his Levemir  15 units in the morning 12 units in the evening  He has sufficient supply of all his medications at this time  He is working on disability so he can achieve assisted living

## 2020-01-16 ENCOUNTER — Other Ambulatory Visit: Payer: Self-pay

## 2020-01-16 DIAGNOSIS — Z79899 Other long term (current) drug therapy: Secondary | ICD-10-CM

## 2020-01-19 ENCOUNTER — Other Ambulatory Visit: Payer: Self-pay

## 2020-01-19 DIAGNOSIS — E785 Hyperlipidemia, unspecified: Secondary | ICD-10-CM

## 2020-01-19 DIAGNOSIS — Z79899 Other long term (current) drug therapy: Secondary | ICD-10-CM

## 2020-01-19 NOTE — Progress Notes (Deleted)
Subjective:    Patient ID: Rodney Barber, male    DOB: [birth year], 54 y.o.   MRN: [id number]  This is a 54 year old male who seen previously in the [place] clinic comes in today with recent hospitalization for hypoglycemia we was on 35 units of Levemir with not eating regularly.  Note he has not been using his pancreatic enzyme medicine regularly.  Also he is missed doses of lisinopril and amlodipine.  On exam blood pressure 186/94 glucose is greater than 400  Patient is thin and in no acute distress chest was clear abdomen was slightly tender extremities showed no edema  Impression is that of type 2 diabetes poorly controlled secondary to poor dietary habits  Also hypertension poorly controlled at this time  12/10/2019 This patient is seen for the first time in the community [facility] wellness clinic.  He previously been followed intermittently at the [facility] clinic.  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  He is also had very poor dietary habits and skips meals at times.  He had an episode of severe hypoglycemia on 30 units of Levemir daily and was admitted between the ninth and 10 April.  Below is the discharge summary.  Admitted early April hypoglycemia   Admit date: 11/14/2019 Discharge date: 11/15/2019  Admitted From: Shelter Disposition: Shelter  Recommendations for Outpatient Follow-up:  1. Follow up with PCP in 1-2 weeks 2. Please obtain BMP/CBC in one week  Discharge Condition: Stable CODE STATUS: Full Diet recommendation: Diabetic diet as tolerated  Brief/Interim Summary: Rodney Barber a 54 y.o.malewith medical history significant oftype 2 diabetes/IDDM, hypertension, pancreatitis, tobacco use, and homelessness who presents with hypoglycemia. Patient is alert and oriented at present but unable to give great recollection of this morning. He states he  recalls speaking with EMS and understands he is now at [facility] because of the low blood sugar. He states he was in usual state of health through last night and took his insulin in the evening as he normally does. However he cannot tell me what dose of insulin he takes. However he does state that he does not normally get a low blood sugar and has not had issues like this throughout the week. He denies any fevers or chills. He denies any new rashes, urinary symptoms, nausea vomiting or diarrhea. He reports he is otherwise taking his meds without assistance. He denies any other symptoms, no cough/shortness of breath, chest pain. Per EMS report, patient was altered and noted to be hypoglycemic to 35. He received D10, 250 cc in route and in the emergency room was again noted to be hypoglycemic to 30s. Patient reports using tobacco, approximately half pack per day. No alcohol, no drugs. He reports ambulating with the assistance of a walker, denies any dizziness lightheadedness or falls.  Patient admitted as above with acute symptomatic hypoglycemia in the setting of medication and dietary noncompliance.  Patient is more awake alert oriented this morning, we discussed his insulin regimen at length.  Patient declines any urinary symptoms, antibiotics will be discontinued appropriately.  It appears he has not been using his blood glucose monitor as he has run out of test strips and lancets, because of this he has been dosing his insulin regularly likely the reason for his A1c being quite elevated but being admitted for acute symptomatic hypoglycemia.  Given concern for ongoing hypoglycemia patient's Lantus has been decreased to 20 units nightly, discussed  that he needs to maintain his diabetic diet and take this medication every night, follow his Accu-Cheks as scheduled and follow-up with his PCP in the next 3 to 4 days.  He indicates his last office visit was supposed to be Wednesday but due to  transportation issues not being provided he was unable to point to their office.  Discussed that should he continue to be noncompliant or use medications inappropriately he had a markedly increased risk of death given his profound hypoglycemia.  Patient understood the risks of not continued medical compliance follow-up and medication administrations.  Patient is otherwise stable and agreeable for discharge home, ambulating without difficulty, tolerating p.o. quite well and glucose remains somewhat well controlled although minimally elevated in the setting of dextrose and hypoglycemic bolusing overnight.   Discharge Diagnoses:  Principal Problem:   Hypoglycemia Active Problems:   Severe malnutrition (HCC)   Protein-calorie malnutrition, severe (HCC)   Altered mental status   AMS (altered mental status)   Noncompliance with medications  Patient comes in today and still is running high blood sugars today his CBG is 340.  He has no longer drinking.  He is not eating healthy foods.  He is trying to eat some protein vegetables and occasionally bananas.  He also has severe dental issues with significant dental caries and periodontal disease.   01/19/2020 Seen on 6/2 and 6/9 post hosp:  6/9: This is 54 year old male who has chronic pancreatic inflammation type 2 diabetes renal insufficiency chronic recurrent abdominal pain and recurrent hospitalizations for diabetes out-of-control he is seen 1 week after having been just discharged from the hospital and we did a post hospital visit last week  Note the patient does have another maternal vaccine due this week and had his previous vaccine his first 1 on May 13  Patient has missed some of his doses of the medications we have now produced for him a pill organizer and instructed on the proper use of all his medications no dose changes are needed he continues his Levemir  15 units in the morning 12 units in the evening  He has sufficient supply of  all his medications at this time  He is working on disability so he can achieve assisted living  Youth worker History  None Sales executive ([place]) Me     01/15/20 1:01 PM Note    Patient ID: Rodney Barber, male   DOB: [birth year], 54 y.o.   MRN: [id number] This is 54 year old male who has chronic pancreatic inflammation type 2 diabetes renal insufficiency chronic recurrent abdominal pain and recurrent hospitalizations for diabetes out-of-control he is seen 1 week after having been just discharged from the hospital and we did a post hospital visit last week  Note the patient does have another maternal vaccine due this week and had his previous vaccine his first 1 on May 13  Patient has missed some of his doses of the medications we have now produced for him a pill organizer and instructed on the proper use of all his medications no dose changes are needed he continues his Levemir  15 units in the morning 12 units in the evening  He has sufficient supply of all his medications at this time  He is working on disability so he can achieve assisted living   6/2 note  This is a 54 year old male who was discharged discharge from the hospital for respiratory failure new onset congestive heart failure coronary artery disease treated medically ejection fraction 35% ventilator dependent  and underlying diabetes with renal insufficiency  Below is a copy of the discharge summary  Admit date:12/28/2019 Discharge date:01/06/2020  Time spent:44mnutes  Recommendations for Outpatient Follow-up: [facility], office to arrange follow-up [place] [facility] clinic on 6/15 at 11 AM Please check BMP in 1 week  Discharge Diagnoses: Acute hypoxic respiratory failure Acute systolic and diastolic CHF, EF of 367%Coronary artery disease Chronic alcoholic pancreatitis EtOH abuse Transudative pleural effusions Type 2 diabetes mellitus on  insulin Homelessness Hyperglycemia Tobacco abuse Acute respiratory failure with hypoxia (HCC) S/P thoracentesis Acute on chronic combined systolic and diastolic CHF (congestive heart failure) (HCC) Essential hypertension Abnormal nuclear stress test Elevated brain natriuretic peptide (BNP) level   Discharge Condition:Improved  Diet recommendation:Low-sodium, diabetic, heart healthy      Filed Weights  01/04/20 0014 01/05/20 0417 01/06/20 0423 Weight: 54.3 kg 53.3 kg 52.4 kg   History of present illness: 54yo homeless male with history of alcoholic pancreatitis, htn, DM2 poorly controlled, presented to [facility] 5/22 with CC SOB, cough. In ED, pt hypoxic, SpO2 70s on RA. Initially, placed on NRB , progressive resp distress and hypoxia was intubated in the emergency room.  Hospital Course:  Acute hypoxemic respiratory failure -Acute systolic CHF, EF of 367%lower from prior -Admitted to the ICU with acute hypoxic respiratory failure requiring mechanical intubation, diuresed with IV Lasix, extubated 5/25 -Status post bilateral thoracentesis 5/25, fluidwastransudative -Followed by cardiology this admission -Clinically improved with diuresis, work-up noted echo with drop in EF down to 35%, subsequently underwent Lexiscan Myoview which was abnormal -Then hadleft heart catheterization-noted 70% RCA disease and otherwise nonobstructive CAD, medical management was recommended -Clinically improved and stable now, transition to oral torsemide and Aldactone, also started on carvedilol, losartan -Discharged back to shelter today -[facility] [place] [facility] as well, needs BMP in 1 week, importance of compliance with medications, salt restriction, diabetic diet and follow-up emphasized  Chronic acoholic pancreatitis  -Resume Creon  COPD/heavy tobacco abuse -Smoking 3 packs/day,  counseled -Nebs PRN  DM2brittle -Brittle diabetic, with significant fluctuations in blood sugars noted -Subsequently transitioned to Levemir 15 units in the morning and 12 at bedtime  Homelessness -Followed bysocial work inpatient, patient is uninsured without family support, [facility] Chronic anemia -Stable  H/o HTN -BP stable, monitor  Since discharge the patient has not taken any of his medications and was discharged yesterday  I proceeded to review all his medications with the patient with the nurse in the clinic and we indicated the medicines he should take and actually watched him take his afternoon dose today of all his medications.  The nurse will obtain for him a medication organizer organizes pills.  We also will need to get this patient into assisted living as a homeless shelter is not the proper location to care for this patient we will also get him back in to see me in the clinic on June 9 I will contact his homeless shelter case manager and make sure he follows up with cardiology     Past Medical History:  Diagnosis Date  . Diabetes mellitus   . Hypertension   . Pancreatitis      Family History  Problem Relation Age of Onset  . Diabetes Mother   . Diabetes Brother      Social History   Socioeconomic History  . Marital status: Single  Spouse name: Not on file  . Number of children: Not on file  . Years of education: Not on file  . Highest education level: Not on file  Occupational History  . Not on file  Tobacco Use  . Smoking status: Current Every Day Smoker    Packs/day: 0.50    Years: 30.00    Pack years: 15.00    Types: Cigarettes  . Smokeless tobacco: Never Used  . Tobacco comment: 6 cigarett /day  Vaping Use  . Vaping Use: Never used  Substance and Sexual Activity  . Alcohol use: No  . Drug use: Not Currently    Types: "Crack" cocaine, Other-see comments, Cocaine    Comment: last  smoked crack 12-16-16  . Sexual activity: Never  Other Topics Concern  . Not on file  Social History Narrative  . Not on file   Social Determinants of Health   Financial Resource Strain:   . Difficulty of Paying Living Expenses:   Food Insecurity:   . Worried About Charity fundraiser in the Last Year:   . Arboriculturist in the Last Year:   Transportation Needs:   . Film/video editor (Medical):   Marland Kitchen Lack of Transportation (Non-Medical):   Physical Activity:   . Days of Exercise per Week:   . Minutes of Exercise per Session:   Stress:   . Feeling of Stress :   Social Connections:   . Frequency of Communication with Friends and Family:   . Frequency of Social Gatherings with Friends and Family:   . Attends Religious Services:   . Active Member of Clubs or Organizations:   . Attends Archivist Meetings:   Marland Kitchen Marital Status:   Intimate Partner Violence:   . Fear of Current or Ex-Partner:   . Emotionally Abused:   Marland Kitchen Physically Abused:   . Sexually Abused:      No Known Allergies   Outpatient Medications Prior to Visit  Medication Sig Dispense Refill  . aspirin 81 MG chewable tablet Chew 1 tablet (81 mg total) by mouth daily. 30 tablet 0  . atorvastatin (LIPITOR) 80 MG tablet Take 1 tablet (80 mg total) by mouth daily at 6 PM. 30 tablet 0  . blood glucose meter kit and supplies KIT Dispense based on patient and insurance preference. Use up to four times daily as directed. (FOR ICD-9 250.00, 250.01). 1 each 0  . carvedilol (COREG) 25 MG tablet Take 1 tablet (25 mg total) by mouth 2 (two) times daily with a meal. 60 tablet 0  . feeding supplement, GLUCERNA SHAKE, (GLUCERNA SHAKE) LIQD Take 237 mLs by mouth 3 (three) times daily between meals.  0  . insulin detemir (LEVEMIR FLEXTOUCH) 100 UNIT/ML FlexPen Inject 12-15 Units into the skin 2 (two) times daily. Take 15units in am and 12units in pm  0  . lipase/protease/amylase (CREON) 12000-38000 units CPEP capsule Take  1 capsule (12,000 Units total) by mouth 3 (three) times daily with meals. 90 capsule 0  . losartan (COZAAR) 100 MG tablet Take 1 tablet (100 mg total) by mouth daily. 30 tablet 0  . pantoprazole (PROTONIX) 40 MG tablet Take 1 tablet (40 mg total) by mouth daily. 30 tablet 0  . spironolactone (ALDACTONE) 25 MG tablet Take 0.5 tablets (12.5 mg total) by mouth daily. 30 tablet 0  . torsemide (DEMADEX) 20 MG tablet Take 1 tablet (20 mg total) by mouth daily. (Patient taking differently: Take 10 mg by mouth daily. )  30 tablet 0   No facility-administered medications prior to visit.      Review of Systems  Constitutional: Positive for activity change, appetite change, fatigue and unexpected weight change.  HENT: Positive for dental problem. Negative for sinus pressure and sinus pain.   Respiratory: Negative.   Cardiovascular: Negative for chest pain, palpitations and leg swelling.  Gastrointestinal: Negative.   Endocrine: Positive for polydipsia and polyuria.  Genitourinary: Positive for enuresis and frequency.  Musculoskeletal: Negative.   Neurological: Positive for light-headedness.  Hematological: Negative.   Psychiatric/Behavioral: Negative.        Objective:   Physical Exam There were no vitals filed for this visit.  Gen: Pleasant, cachectic, in no distress,  normal affect  ENT: No lesions,  mouth clear,  oropharynx clear, no postnasal drip  Neck: No JVD, no TMG, no carotid bruits  Lungs: No use of accessory muscles, no dullness to percussion, clear without rales or rhonchi  Cardiovascular: RRR, heart sounds normal, no murmur or gallops, no peripheral edema  Abdomen: soft and NT, no HSM,  BS normal  Musculoskeletal: No deformities, no cyanosis or clubbing  Neuro: alert, non focal  Skin: Warm, no lesions or rashes  CBG 340  Foot exam shows pulses intact severe dry skin sensation intact fungal involvement of toenails no ulceration or other lesions     Assessment &  Plan:  I personally reviewed all images and lab data in the [facility] system as well as any outside material available during this office visit and agree with the  radiology impressions.   No problem-specific Assessment & Plan notes found for this encounter.   There are no diagnoses linked to this encounter.A tetanus vaccine was given at this visit

## 2020-01-20 ENCOUNTER — Inpatient Hospital Stay: Payer: Self-pay | Admitting: Critical Care Medicine

## 2020-01-20 ENCOUNTER — Telehealth: Payer: Self-pay | Admitting: Physician Assistant

## 2020-01-20 LAB — HEPATIC FUNCTION PANEL
ALT: 28 IU/L (ref 0–44)
AST: 31 IU/L (ref 0–40)
Albumin: 3.6 g/dL — ABNORMAL LOW (ref 3.8–4.9)
Alkaline Phosphatase: 168 IU/L — ABNORMAL HIGH (ref 48–121)
Bilirubin Total: 0.2 mg/dL (ref 0.0–1.2)
Bilirubin, Direct: 0.09 mg/dL (ref 0.00–0.40)
Total Protein: 6.3 g/dL (ref 6.0–8.5)

## 2020-01-20 LAB — LIPID PANEL
Chol/HDL Ratio: 2 ratio (ref 0.0–5.0)
Cholesterol, Total: 162 mg/dL (ref 100–199)
HDL: 82 mg/dL (ref >39)
LDL Chol Calc (NIH): 61 mg/dL (ref 0–99)
Triglycerides: 108 mg/dL (ref 0–149)
VLDL Cholesterol Cal: 19 mg/dL (ref 5–40)

## 2020-01-20 LAB — BASIC METABOLIC PANEL
BUN/Creatinine Ratio: 19 (ref 9–20)
BUN: 22 mg/dL (ref 6–24)
CO2: 22 mmol/L (ref 20–29)
Calcium: 9.1 mg/dL (ref 8.7–10.2)
Chloride: 90 mmol/L — ABNORMAL LOW (ref 96–106)
Creatinine, Ser: 1.14 mg/dL (ref 0.76–1.27)
GFR calc Af Amer: 83 mL/min/{1.73_m2} (ref >59)
GFR calc non Af Amer: 72 mL/min/{1.73_m2} (ref >59)
Glucose: 667 mg/dL (ref 65–99)
Potassium: 5 mmol/L (ref 3.5–5.2)
Sodium: 130 mmol/L — ABNORMAL LOW (ref 134–144)

## 2020-01-20 NOTE — Telephone Encounter (Signed)
Thank you :)

## 2020-01-20 NOTE — Telephone Encounter (Signed)
Brother Trey Paula returned call. Notified him that patient's glucose was up, he was supposed to f/u with Dr. Lynelle Doctor office today. Trey Paula said he forgot to pick up patient for appointment. Provided phone # for PCP office to call and r/s visit.

## 2020-01-20 NOTE — Progress Notes (Signed)
Liver function and cholesterol well controlled. Kidney function improving. Unfortunately, blood sugar level still not controlled, need to followup with primary care doctor

## 2020-01-20 NOTE — Telephone Encounter (Signed)
Follow Up ° °Patient returning call. Transferred to J[place] °

## 2020-01-20 NOTE — Telephone Encounter (Signed)
Noted result of glucose and he was to be here today, not here as of yet. Will f/u with him at shelter if he no shows today

## 2020-01-20 NOTE — Telephone Encounter (Signed)
[facility] called critical glucose of 664 (drawn 01/19/2020). This was a fasting specimen.   Per 01/14/20 ED note: Given his very brittle diabetic and fluid state, I will defer insulin regimen and diuretic regimen to cardiology, both of whom have been following closely.   Routed to ordering provider & PCP  Left message for patient to call back to determine what blood sugar is today, per request of DOD - Dr. Bjorn Pippin

## 2020-01-21 ENCOUNTER — Other Ambulatory Visit: Payer: Self-pay | Admitting: Critical Care Medicine

## 2020-01-21 ENCOUNTER — Telehealth: Payer: Self-pay

## 2020-01-21 ENCOUNTER — Encounter: Payer: Self-pay | Admitting: Critical Care Medicine

## 2020-01-21 MED ORDER — CARVEDILOL 25 MG PO TABS: 25.0000 mg | ORAL_TABLET | Freq: Two times a day (BID) | ORAL | 0 refills | Status: DC

## 2020-01-21 MED ORDER — LEVEMIR FLEXTOUCH 100 UNIT/ML ~~LOC~~ SOPN: 20.0000 [IU] | PEN_INJECTOR | Freq: Two times a day (BID) | SUBCUTANEOUS | 0 refills | Status: DC

## 2020-01-21 MED ORDER — LOSARTAN POTASSIUM 100 MG PO TABS: 100.0000 mg | ORAL_TABLET | Freq: Every day | ORAL | 0 refills | Status: DC

## 2020-01-21 MED ORDER — ASPIRIN 81 MG PO CHEW: 81.0000 mg | CHEWABLE_TABLET | Freq: Every day | ORAL | 0 refills | Status: DC

## 2020-01-21 MED ORDER — SPIRONOLACTONE 25 MG PO TABS: 12.5000 mg | ORAL_TABLET | Freq: Every day | ORAL | 0 refills | Status: DC

## 2020-01-21 MED ORDER — PANTOPRAZOLE SODIUM 40 MG PO TBEC: 40.0000 mg | DELAYED_RELEASE_TABLET | Freq: Every day | ORAL | 0 refills | Status: DC

## 2020-01-21 MED ORDER — ATORVASTATIN CALCIUM 80 MG PO TABS: 80.0000 mg | ORAL_TABLET | Freq: Every day | ORAL | 0 refills | Status: DC

## 2020-01-21 MED FILL — LEVEMIR FLEXTOUCH 100 UNITS: 100 | 30 days supply | Qty: 12 | Fill #0

## 2020-01-21 MED FILL — UNIFINE PENTIPS 32GX5/32: 32G X 4 MM | 30 days supply | Qty: 100 | Fill #0

## 2020-01-21 NOTE — Telephone Encounter (Signed)
I spoke to the patient brother today

## 2020-01-21 NOTE — Progress Notes (Signed)
Patient ID: Rodney Barber, male   DOB: Sep 22, 1965, 54 y.o.   MRN: [id number] This is a 54 year old male who is seen in post hospital visit.  He missed his visit with me 2 days ago in clinic because he was not given a taxi voucher.  He has a history of type 2 diabetes made worse with severe chronic pancreatitis from alcoholism.  He now has coronary disease with ischemic cardiomyopathy as well.  Recent was hospitalized for acute heart failure and severe glycemia out-of-control.  He went back to the emergency room recently with elevated blood sugars was given a course of fluids and subcu insulin and sent home.  Cardiology 2 days ago the patient was seen in post hospital follow-up and had blood sugar of 667.  Today on arrival blood sugar is at 467 on exam blood pressure 130/71 pulse is 71 saturation 99% room air  Below is a copy of the recent discharge summary  Discharge Diagnoses:  Acute hypoxic respiratory failure Acute systolic and diastolic CHF, EF of 35% Coronary artery disease Chronic alcoholic pancreatitis EtOH abuse Transudative pleural effusions Type 2 diabetes mellitus on insulin Homelessness   Hyperglycemia   Tobacco abuse   Acute respiratory failure with hypoxia (HCC)   S/P thoracentesis   Acute on chronic combined systolic and diastolic CHF (congestive heart failure) (HCC)   Essential hypertension   Abnormal nuclear stress test   Elevated brain natriuretic peptide (BNP) level  Impression is that of stable systolic diastolic heart failure chronic alcoholic pancreatitis coronary artery disease but out-of-control type 2 diabetes with homelessness and tobacco use  Plan for this patient will be to give a 20 unit dose of Levemir now and have him increase his Levemir to 20 units twice daily.  Note he has been compliant with his oral medications I refilled his pill organizer and we will send refills on all of his medicines were about to run out to pick up tomorrow patient will also  have return follow-up with me in the clinic

## 2020-01-21 NOTE — Telephone Encounter (Signed)
Brother Mrk Buzby [phone] called to inquire if any diabetes medications needed to be changed since his sugars were high at the hospital. Please advise.

## 2020-01-22 ENCOUNTER — Encounter: Payer: Self-pay | Admitting: Pediatric Intensive Care

## 2020-01-22 MED FILL — PANTOPRAZOLE SOD DR 40 MG T: 40 | 30 days supply | Qty: 30 | Fill #0

## 2020-01-22 MED FILL — LOSARTAN POTASSIUM 100 MG T: 100 | 30 days supply | Qty: 30 | Fill #0

## 2020-01-22 MED FILL — CARVEDILOL 25 MG TABLET: 25 | 30 days supply | Qty: 60 | Fill #0

## 2020-01-22 MED FILL — SPIRONOLACTONE 25 MG TABS: 25 | 30 days supply | Qty: 15 | Fill #0

## 2020-01-22 MED FILL — ATORVASTATIN 80 MG TABLET: 80 | 30 days supply | Qty: 30 | Fill #0

## 2020-01-22 NOTE — Congregational Nurse Program (Signed)
  Dept: [facility] Nurse Program Note  Date of Encounter: 01/22/2020  Past Medical History: Past Medical History:  Diagnosis Date  . Diabetes mellitus   . Hypertension   . Pancreatitis     Encounter Details: Met with client to review medication and discuss insulin administration. CN demo setting [place] pen with client return demo. Client states that he can read medication bottles and see numbers on insulin pen. CN advised client to take morning medication but to take PM insulin dose at 5pm as he missed medication this morning. CN advised client that she may be back on Tuesday to assist with filling medication box. Lisette Abu RN BSN CNP [phone]

## 2020-01-28 ENCOUNTER — Encounter: Payer: Self-pay | Admitting: Critical Care Medicine

## 2020-02-04 ENCOUNTER — Encounter: Payer: Self-pay | Admitting: Critical Care Medicine

## 2020-02-04 ENCOUNTER — Other Ambulatory Visit: Payer: Self-pay | Admitting: Critical Care Medicine

## 2020-02-04 MED ORDER — TORSEMIDE 20 MG PO TABS: 20.0000 mg | ORAL_TABLET | Freq: Every day | ORAL | 0 refills | Status: DC

## 2020-02-04 NOTE — Progress Notes (Signed)
Torsemide refill.

## 2020-02-04 NOTE — Progress Notes (Signed)
Patient ID: Rodney Barber, male   DOB: 10-15-1965, 54 y.o.   MRN: [id number]  This patient is seen in the [facility] shelter clinic today for follow-up with his diabetes.  Upon arrival I checked his glucose and it read high which likely means it is over 400.  He is on the 20 units twice daily of Levemir and I did increase him to 25 units twice daily at this visit.  All of his other medications remain the same and I will follow him up in short-term follow-up in a week.

## 2020-02-05 MED FILL — TORSEMIDE 20 MG TABLET: 20 | 30 days supply | Qty: 30 | Fill #0

## 2020-02-05 NOTE — Progress Notes (Signed)
Patient ID: Rodney Barber, male   DOB: 06-03-1966, 54 y.o.   MRN: [id number] This patient was seen in the [facility] and is following up with diabetes note his glucose today was greater than 400.  He is out of his torsemide.  I did refill all of his pill organizer.  Plan will be to maintain insulin at current prescribed dose no change in other medications we will continue to follow this patient weekly

## 2020-02-11 ENCOUNTER — Encounter: Payer: Self-pay | Admitting: Critical Care Medicine

## 2020-02-11 ENCOUNTER — Other Ambulatory Visit: Payer: Self-pay

## 2020-02-11 ENCOUNTER — Ambulatory Visit: Payer: Self-pay | Attending: Physician Assistant | Admitting: Physician Assistant

## 2020-02-11 ENCOUNTER — Encounter: Payer: Self-pay | Admitting: Physician Assistant

## 2020-02-11 ENCOUNTER — Telehealth: Payer: Self-pay

## 2020-02-11 ENCOUNTER — Other Ambulatory Visit: Payer: Self-pay | Admitting: Critical Care Medicine

## 2020-02-11 DIAGNOSIS — I10 Essential (primary) hypertension: Secondary | ICD-10-CM

## 2020-02-11 DIAGNOSIS — K8689 Other specified diseases of pancreas: Secondary | ICD-10-CM

## 2020-02-11 DIAGNOSIS — E0865 Diabetes mellitus due to underlying condition with hyperglycemia: Secondary | ICD-10-CM

## 2020-02-11 DIAGNOSIS — K860 Alcohol-induced chronic pancreatitis: Secondary | ICD-10-CM

## 2020-02-11 DIAGNOSIS — E785 Hyperlipidemia, unspecified: Secondary | ICD-10-CM

## 2020-02-11 DIAGNOSIS — IMO0002 Reserved for concepts with insufficient information to code with codable children: Secondary | ICD-10-CM

## 2020-02-11 DIAGNOSIS — I5043 Acute on chronic combined systolic (congestive) and diastolic (congestive) heart failure: Secondary | ICD-10-CM

## 2020-02-11 DIAGNOSIS — K219 Gastro-esophageal reflux disease without esophagitis: Secondary | ICD-10-CM

## 2020-02-11 MED ORDER — SPIRONOLACTONE 25 MG PO TABS: 25.0000 mg | ORAL_TABLET | Freq: Every day | ORAL | 0 refills | Status: DC

## 2020-02-11 MED ORDER — LEVEMIR FLEXTOUCH 100 UNIT/ML ~~LOC~~ SOPN: 30.0000 [IU] | PEN_INJECTOR | Freq: Two times a day (BID) | SUBCUTANEOUS | 0 refills | Status: DC

## 2020-02-11 MED ORDER — SPIRONOLACTONE 25 MG PO TABS: 25.0000 mg | ORAL_TABLET | Freq: Every day | ORAL | 2 refills | Status: DC

## 2020-02-11 MED ORDER — PANCRELIPASE (LIP-PROT-AMYL) 12000-38000 UNITS PO CPEP: 12000.0000 [IU] | ORAL_CAPSULE | Freq: Three times a day (TID) | ORAL | 3 refills | Status: DC

## 2020-02-11 MED ORDER — PANTOPRAZOLE SODIUM 40 MG PO TBEC: 40.0000 mg | DELAYED_RELEASE_TABLET | Freq: Every day | ORAL | 3 refills | Status: DC

## 2020-02-11 MED ORDER — TORSEMIDE 20 MG PO TABS: 20.0000 mg | ORAL_TABLET | Freq: Every day | ORAL | 0 refills | Status: DC

## 2020-02-11 MED ORDER — ATORVASTATIN CALCIUM 80 MG PO TABS: 80.0000 mg | ORAL_TABLET | Freq: Every day | ORAL | 3 refills | Status: DC

## 2020-02-11 MED ORDER — LEVEMIR FLEXTOUCH 100 UNIT/ML ~~LOC~~ SOPN: 30.0000 [IU] | PEN_INJECTOR | Freq: Two times a day (BID) | SUBCUTANEOUS | 3 refills | Status: DC

## 2020-02-11 MED ORDER — LOSARTAN POTASSIUM 100 MG PO TABS: 100.0000 mg | ORAL_TABLET | Freq: Every day | ORAL | 3 refills | Status: DC

## 2020-02-11 MED FILL — LEVEMIR FLEXTOUCH 100 UNITS: 100 | 25 days supply | Qty: 15 | Fill #0

## 2020-02-11 MED FILL — SPIRONOLACTONE 25 MG TABS: 25 | 30 days supply | Qty: 30 | Fill #0

## 2020-02-11 NOTE — Progress Notes (Signed)
Virtual Visit via Telephone Note  I connected with [name] on 02/11/20 at  2:30 PM EDT by telephone and verified that I am speaking with the correct person using two identifiers.   I discussed the limitations, risks, security and privacy concerns of performing an evaluation and management service by telephone and the availability of in person appointments. I also discussed with the patient that there may be a patient responsible charge related to this service. The patient expressed understanding and agreed to proceed.  PATIENT visit by telephone virtually in the context of Covid-19 pandemic. Patient location: home My Location:  [facility] office Persons on the call:  Me and the patient   History of Present Illness: Recently seen by Dr [name] at the [facility].  Most recently his levemir was increased to 30 units bid-this was just increased today.  Prior to today, he was taking 25 units levemir bid and blood sugars were running low 200s.  At time of call, he just ate lunch and blood sugar was 209.  He denies s/sx hyper/hypoglycemia and says he is feeling good overall.  He needs RF on his medications.     Observations/Objective:  NAD.  A&Ox3   Assessment and Plan: 1. 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  Check blood sugars at least bid and record and have available for next visit - atorvastatin (LIPITOR) 80 MG tablet; Take 1 tablet (80 mg total) by mouth daily at 6 PM.  Dispense: 30 tablet; Refill: 3 - insulin detemir (LEVEMIR FLEXTOUCH) 100 UNIT/ML FlexPen; Inject 30 Units into the skin 2 (two) times daily.  Dispense: 15 mL; Refill: 3  2. Alcohol-induced chronic pancreatitis (HCC) Avoid alcohol - lipase/protease/amylase (CREON) 12000-38000 units CPEP capsule; Take 1 capsule (12,000 Units total) by mouth 3 (three) times daily with meals.  Dispense: 90 capsule; Refill:  3  3. Essential hypertension - losartan (COZAAR) 100 MG tablet; Take 1 tablet (100 mg total) by mouth daily.  Dispense: 30 tablet; Refill: 3  4. Gastroesophageal reflux disease without esophagitis - pantoprazole (PROTONIX) 40 MG tablet; Take 1 tablet (40 mg total) by mouth daily.  Dispense: 30 tablet; Refill: 3  5. Acute on chronic combined systolic and diastolic CHF (congestive heart failure) (HCC) - spironolactone (ALDACTONE) 25 MG tablet; Take 1 tablet (25 mg total) by mouth daily.  Dispense: 30 tablet; Refill: 2 - torsemide (DEMADEX) 20 MG tablet; Take 1 tablet (20 mg total) by mouth daily.  Dispense: 90 tablet; Refill: 0  6. Hyperlipidemia, unspecified hyperlipidemia type - atorvastatin (LIPITOR) 80 MG tablet; Take 1 tablet (80 mg total) by mouth daily at 6 PM.  Dispense: 30 tablet; Refill: 3    Follow Up Instructions: See Dr [name] in 2-3 weeks-televist should be ok   I discussed the assessment and treatment plan with the patient. The patient was provided an opportunity to ask questions and all were answered. The patient agreed with the plan and demonstrated an understanding of the instructions.   The patient was advised to call back or seek an in-person evaluation if the symptoms worsen or if the condition fails to improve as anticipated.  I provided 9 minutes of non-face-to-face time during this encounter.   Georgian Co, PA-C  Patient ID: Rodney Barber, male   DOB: Jun 14, 1966, 54 y.o.   MRN: [id number]

## 2020-02-11 NOTE — Telephone Encounter (Signed)
Is Creon medically necessary?  We have samples of Zenpep if this would be appropriate to change?

## 2020-02-11 NOTE — Progress Notes (Signed)
Patient ID: Rodney Barber, male   DOB: 1966-03-27, 54 y.o.   MRN: [id number] Patient seen today in the [facility] for follow-up has severe type 2 diabetes systolic heart failure ischemic heart disease.  Patient also has chronic pancreatitis with alcoholism.  On exam blood pressure is 140/80 glucose is 209 on the CBG pulse and oxygen saturations are adequate physical exam shows minimal edema in the foot mild tenderness in the right foot compatible with diabetic neuropathy  Impression is that of diabetes poorly controlled plan will be to increase Levemir to 30 units twice daily and increase Aldactone to 25 mg daily refill sent on the Levemir and the Aldactone no change in other medications and I refill the patient's pill organizer

## 2020-02-11 NOTE — Progress Notes (Signed)
Med refills

## 2020-02-12 NOTE — Telephone Encounter (Signed)
Ok to change to zenpep.   Note he has a full bottle of creon now so does not need immediate refill

## 2020-02-12 NOTE — Telephone Encounter (Signed)
Please check with Dr [name] as far as changing creon to zenprep.  He will probably be fine with it, but he is more familiar with the patient than me.  Thanks, Georgian Co, PA-C

## 2020-02-12 NOTE — Telephone Encounter (Signed)
Ok, thank you

## 2020-02-18 ENCOUNTER — Encounter: Payer: Self-pay | Admitting: Pediatric Intensive Care

## 2020-02-18 ENCOUNTER — Other Ambulatory Visit: Payer: Self-pay | Admitting: Pediatric Intensive Care

## 2020-02-18 DIAGNOSIS — E119 Type 2 diabetes mellitus without complications: Secondary | ICD-10-CM

## 2020-02-18 LAB — GLUCOSE, POCT (MANUAL RESULT ENTRY)
POC Glucose: 583 mg/dl — AB (ref 70–99)
POC Glucose: 584 mg/dl — AB (ref 70–99)

## 2020-02-23 ENCOUNTER — Other Ambulatory Visit: Payer: Self-pay | Admitting: Pediatric Intensive Care

## 2020-02-23 DIAGNOSIS — E119 Type 2 diabetes mellitus without complications: Secondary | ICD-10-CM

## 2020-02-23 NOTE — Congregational Nurse Program (Signed)
  Dept: [phone]   Congregational Nurse Program Note  Date of Encounter: 02/18/2020  Past Medical History: Past Medical History:  Diagnosis Date  . Diabetes mellitus   . Hypertension   . Pancreatitis     Encounter Details: CN visit to check BG and assist with medication box refill. Client states he did not take his insulin this morning because he was made to leave the building due to repairs.Client states that he has increased his insulin to 30units am/pm. Client took all am medication in office and gave 30 units of insulin. Client is aware that his blood sugar is very high but reports no symptoms. CN advised client to notify staff if feeling dizzy, nauseous or having chest pain. Staff message to Dr [name]. Shann Medal RN BSN CNP [phone]

## 2020-02-25 ENCOUNTER — Encounter: Payer: Self-pay | Admitting: Critical Care Medicine

## 2020-02-25 ENCOUNTER — Other Ambulatory Visit: Payer: Self-pay | Admitting: Critical Care Medicine

## 2020-02-25 DIAGNOSIS — I10 Essential (primary) hypertension: Secondary | ICD-10-CM

## 2020-02-25 DIAGNOSIS — K219 Gastro-esophageal reflux disease without esophagitis: Secondary | ICD-10-CM

## 2020-02-25 DIAGNOSIS — E785 Hyperlipidemia, unspecified: Secondary | ICD-10-CM

## 2020-02-25 DIAGNOSIS — IMO0002 Reserved for concepts with insufficient information to code with codable children: Secondary | ICD-10-CM

## 2020-02-25 MED ORDER — LOSARTAN POTASSIUM 100 MG PO TABS: 100.0000 mg | ORAL_TABLET | Freq: Every day | ORAL | 3 refills | Status: DC

## 2020-02-25 MED ORDER — PANTOPRAZOLE SODIUM 40 MG PO TBEC: 40.0000 mg | DELAYED_RELEASE_TABLET | Freq: Every day | ORAL | 3 refills | Status: DC

## 2020-02-25 MED ORDER — ATORVASTATIN CALCIUM 80 MG PO TABS: 80.0000 mg | ORAL_TABLET | Freq: Every day | ORAL | 3 refills | Status: DC

## 2020-02-25 MED FILL — ATORVASTATIN 80 MG TABLET: 80 | 30 days supply | Qty: 30 | Fill #0

## 2020-02-25 MED FILL — PANTOPRAZOLE SOD DR 40 MG T: 40 | 30 days supply | Qty: 30 | Fill #0

## 2020-02-25 MED FILL — LOSARTAN POTASSIUM 100 MG T: 100 | 30 days supply | Qty: 30 | Fill #0

## 2020-02-26 NOTE — Progress Notes (Signed)
Patient ID: Rodney Barber, male   DOB: [birth year], 54 y.o.   MRN: [id number]  Patient is seen in the [facility] clinic in follow-up and has severe type 2 diabetes severe insulin resistant.  He is on Levemir 30 units twice daily but misses many of his doses and is not compliant with his diet.  For example today he had pastries for breakfast and had a sugar Pepsi on his possession when I met with him.  We went over his medications again and I refilled his pill organizer.  His medicines and not change but he does need refills on Protonix atorvastatin and losartan.  On exam blood pressure 110/60 pulse 71 saturation 97% room air no blood sugar is 540  Exam is unchanged from prior exams  Impression is severe type 2 diabetes poorly controlled and ischemic cardiomyopathy with congestive heart failure hypertension that is in better control  The patient's cardiovascular status is controlled but his diabetes is not  Plan is continue current medications as prescribed I have increased his Levemir to 40 units twice daily

## 2020-03-03 ENCOUNTER — Encounter: Payer: Self-pay | Admitting: Critical Care Medicine

## 2020-03-03 ENCOUNTER — Other Ambulatory Visit: Payer: Self-pay | Admitting: Critical Care Medicine

## 2020-03-03 DIAGNOSIS — I5043 Acute on chronic combined systolic (congestive) and diastolic (congestive) heart failure: Secondary | ICD-10-CM

## 2020-03-03 MED ORDER — SPIRONOLACTONE 25 MG PO TABS: 25.0000 mg | ORAL_TABLET | Freq: Every day | ORAL | 2 refills | Status: DC

## 2020-03-03 MED ORDER — CARVEDILOL 25 MG PO TABS: 25.0000 mg | ORAL_TABLET | Freq: Two times a day (BID) | ORAL | 0 refills | Status: DC

## 2020-03-03 MED ORDER — TORSEMIDE 20 MG PO TABS: 20.0000 mg | ORAL_TABLET | Freq: Every day | ORAL | 0 refills | Status: DC

## 2020-03-03 MED FILL — SPIRONOLACTONE 25 MG TABS: 25 | 30 days supply | Qty: 30 | Fill #0

## 2020-03-03 MED FILL — TORSEMIDE 20 MG TABLET: 20 | 30 days supply | Qty: 30 | Fill #0

## 2020-03-03 MED FILL — CARVEDILOL 25 MG TABLET: 25 | 30 days supply | Qty: 60 | Fill #0

## 2020-03-05 NOTE — Progress Notes (Signed)
Patient ID: Rodney Barber, male   DOB: [birth year], 54 y.o.   MRN: [id number] This patient is seen today in the [facility] follow-up diabetes for blood sugar check  Blood pressure 160 pulse 64 saturation 98% room air blood sugar today is 308  Patient does complain of fatigue increased fatigue and he has been excessive this week.  On exam the patient has decreased skin turgor remainder of exam is unremarkable  Impression is history of chronic heart failure which is stabilized, type 1 diabetes insulin-dependent,  Plan will be to increase Levemir to 35 units twice daily continue current medications as prescribed and will refill his medication organizer

## 2020-03-10 ENCOUNTER — Other Ambulatory Visit: Payer: Self-pay | Admitting: Critical Care Medicine

## 2020-03-10 ENCOUNTER — Encounter: Payer: Self-pay | Admitting: Critical Care Medicine

## 2020-03-10 DIAGNOSIS — IMO0002 Reserved for concepts with insufficient information to code with codable children: Secondary | ICD-10-CM

## 2020-03-10 DIAGNOSIS — K860 Alcohol-induced chronic pancreatitis: Secondary | ICD-10-CM

## 2020-03-10 DIAGNOSIS — I5043 Acute on chronic combined systolic (congestive) and diastolic (congestive) heart failure: Secondary | ICD-10-CM

## 2020-03-10 MED ORDER — LEVEMIR FLEXTOUCH 100 UNIT/ML ~~LOC~~ SOPN: 35.0000 [IU] | PEN_INJECTOR | Freq: Two times a day (BID) | SUBCUTANEOUS | 3 refills | Status: DC

## 2020-03-10 MED ORDER — SPIRONOLACTONE 25 MG PO TABS: 25.0000 mg | ORAL_TABLET | Freq: Every day | ORAL | 2 refills | Status: DC

## 2020-03-10 MED ORDER — PANCRELIPASE (LIP-PROT-AMYL) 12000-38000 UNITS PO CPEP: 12000.0000 [IU] | ORAL_CAPSULE | Freq: Three times a day (TID) | ORAL | 3 refills | Status: DC

## 2020-03-10 MED ORDER — TORSEMIDE 20 MG PO TABS: 20.0000 mg | ORAL_TABLET | Freq: Every day | ORAL | 0 refills | Status: DC

## 2020-03-10 NOTE — Progress Notes (Signed)
Patient ID: Rodney Barber, male   DOB: 04/22/1966, 54 y.o.   MRN: [id number] This is a 54 year old male who comes to the [facility] today for medication reconciliation  It does not appear he received his medicines from last week which he needed torsemide Coreg  and Aldactone  Check the patient's glucose today it was over 400 he is on the Levemir 35 units twice daily  Blood pressure was 120/60  Patient's exam is unremarkable  Plan is to obtain for the patient refills on those 3 medicines as noted above

## 2020-03-11 ENCOUNTER — Other Ambulatory Visit: Payer: Self-pay | Admitting: Critical Care Medicine

## 2020-03-11 MED ORDER — ZENPEP 5000-24000 UNITS PO CPEP: ORAL_CAPSULE | ORAL | 1 refills | Status: DC

## 2020-03-11 MED FILL — !ZENPEP 20000-63000 UNIT CA: 20000-63000 | 33 days supply | Qty: 100 | Fill #0

## 2020-03-17 ENCOUNTER — Other Ambulatory Visit: Payer: Self-pay | Admitting: Critical Care Medicine

## 2020-03-17 ENCOUNTER — Encounter: Payer: Self-pay | Admitting: Critical Care Medicine

## 2020-03-17 DIAGNOSIS — IMO0002 Reserved for concepts with insufficient information to code with codable children: Secondary | ICD-10-CM

## 2020-03-17 DIAGNOSIS — E785 Hyperlipidemia, unspecified: Secondary | ICD-10-CM

## 2020-03-17 DIAGNOSIS — I10 Essential (primary) hypertension: Secondary | ICD-10-CM

## 2020-03-17 MED ORDER — ZENPEP 5000-24000 UNITS PO CPEP: ORAL_CAPSULE | ORAL | 1 refills | Status: DC

## 2020-03-17 MED ORDER — ATORVASTATIN CALCIUM 80 MG PO TABS: 80.0000 mg | ORAL_TABLET | Freq: Every day | ORAL | 3 refills | Status: DC

## 2020-03-17 MED ORDER — LOSARTAN POTASSIUM 100 MG PO TABS: 100.0000 mg | ORAL_TABLET | Freq: Every day | ORAL | 3 refills | Status: DC

## 2020-03-18 ENCOUNTER — Telehealth: Payer: Self-pay | Admitting: Critical Care Medicine

## 2020-03-18 ENCOUNTER — Other Ambulatory Visit: Payer: Self-pay | Admitting: Critical Care Medicine

## 2020-03-18 ENCOUNTER — Other Ambulatory Visit (HOSPITAL_COMMUNITY): Payer: Self-pay | Admitting: Critical Care Medicine

## 2020-03-18 MED ORDER — CREON 6000-19000 UNITS PO CPEP: 2.0000 | ORAL_CAPSULE | Freq: Three times a day (TID) | ORAL | 0 refills | Status: DC

## 2020-03-18 MED FILL — LOSARTAN POTASSIUM 100 MG T: 100 | 30 days supply | Qty: 30 | Fill #0

## 2020-03-18 MED FILL — CREON DR 6,000 UNITS CAP: 6000-19000 | 30 days supply | Qty: 180 | Fill #0

## 2020-03-18 MED FILL — ATORVASTATIN 80 MG TABLET: 80 | 30 days supply | Qty: 30 | Fill #0

## 2020-03-18 MED FILL — SPIRONOLACTONE 25 MG TABS: 25 | 30 days supply | Qty: 30 | Fill #0

## 2020-03-18 MED FILL — TORSEMIDE 20 MG TABLET: 20 | 30 days supply | Qty: 30 | Fill #0

## 2020-03-18 NOTE — Telephone Encounter (Signed)
Rx was sent by Dr. [name] earlier this morning. Called [facility] to verify.

## 2020-03-18 NOTE — Telephone Encounter (Signed)
Mary with [facility] calling to advise the  Pancrelipase, Lip-Prot-Amyl, (ZENPEP) 5000-24000 units CPEP Is no longer available.   The 2 alternate options are:  2 of Zenpep 3000   OR Creon DR 6000  Please cb to switch Rx to one of these.  [facility] - [place], [place] - [address] [facility] [place]. Phone:  [phone]  Fax:  [phone]

## 2020-03-18 NOTE — Progress Notes (Signed)
Patient ID: Rodney Barber, male   DOB: May 08, 1966, 54 y.o.   MRN: [id number] Patient seen in the [place] shelter clinic has severe diabetes comes in today feeling somewhat dizzy and weak that he has been quite difficult lately on exam blood pressure 95/55 saturation 96% room air pulse 70 blood sugar today is 179 which is the best I seen it  Impression is that of diabetes with improved control  Plan for this patient is to hold torsemide for 3 days then resume I did refill all his pill organizer with his medications and sent in refills for the 3 medicines that he currently needs refill to keep his medications going he will maintain Levemir 35 units twice daily

## 2020-03-25 ENCOUNTER — Encounter: Payer: Self-pay | Admitting: Critical Care Medicine

## 2020-03-25 ENCOUNTER — Other Ambulatory Visit: Payer: Self-pay | Admitting: Critical Care Medicine

## 2020-03-25 DIAGNOSIS — IMO0002 Reserved for concepts with insufficient information to code with codable children: Secondary | ICD-10-CM

## 2020-03-25 DIAGNOSIS — E0865 Diabetes mellitus due to underlying condition with hyperglycemia: Secondary | ICD-10-CM

## 2020-03-25 DIAGNOSIS — K219 Gastro-esophageal reflux disease without esophagitis: Secondary | ICD-10-CM

## 2020-03-25 MED ORDER — PANTOPRAZOLE SODIUM 40 MG PO TBEC: 40.0000 mg | DELAYED_RELEASE_TABLET | Freq: Every day | ORAL | 3 refills | Status: DC

## 2020-03-25 MED ORDER — LEVEMIR FLEXTOUCH 100 UNIT/ML ~~LOC~~ SOPN: 35.0000 [IU] | PEN_INJECTOR | Freq: Two times a day (BID) | SUBCUTANEOUS | 3 refills | Status: DC

## 2020-03-25 MED FILL — PANTOPRAZOLE SOD DR 40 MG T: 40 | 30 days supply | Qty: 30 | Fill #0

## 2020-03-25 MED FILL — LEVEMIR FLEXTOUCH 100 UNITS: 100 | 30 days supply | Qty: 21 | Fill #0

## 2020-03-26 NOTE — Progress Notes (Signed)
Patient ID: Gennaro Lizotte, male   DOB: 02/18/1966, 54 y.o.   MRN: [id number] This is a 54 year old male severe diabetes chronic pancreatitis pancreatic insufficiency coronary disease recent MI with heart failure  Patient seen in the [facility].  On exam today blood pressure 100/70 saturation 96% room air pulse 76 blood sugar was 429  Patient has run out of his insulin and I suspect this is where the sugar elevation is occurring  Plan is to get his Levemir insulin refilled and also refill his proton pump inhibitor I also completely refilled his pill organizer for the following week

## 2020-03-31 ENCOUNTER — Encounter: Payer: Self-pay | Admitting: Critical Care Medicine

## 2020-04-01 NOTE — Progress Notes (Signed)
Patient ID: Rodney Barber, male   DOB: [birth year], 54 y.o.   MRN: [id number] This is a 54 year old male seen in the [facility] with severe diabetes from chronic pancreatitis secondary to alcohol use  The patient states that he does been difficult for him and he has missed more doses of his insulin.  Patient has complained of some midepigastric pain recently his bowel movements are normal he has no nausea or vomiting  On exam blood pressure 100/51 saturation 96% pulse 73 blood glucose was 552  Patient has a clear chest cardiac exam unremarkable abdomen shows some tenderness in the epigastric area  Impression is that of diabetes poorly controlled secondary to dietary nonadherence and missing doses of insulin in a shelter environment.  This patient ideally needs to be an assisted living type environment and he does not not have Medicaid yet.  He is awaiting disability.  I engaged the patient's case manager to push them to see if we can get his disability process accelerated I also need to get this patient into the clinic for further follow-up  Plan today is to refill his pill organizer increase his insulin to 40 units 2 times a day I gave him an extra dose of 20 units of Levemir now while saw him in the clinic we will also refill his Protonix which is running low all his other meds are in good order

## 2020-04-02 ENCOUNTER — Telehealth: Payer: Self-pay | Admitting: Critical Care Medicine

## 2020-04-02 NOTE — Telephone Encounter (Signed)
Called patient and left message to return call and schedule an appt with Dr. [name] in September.

## 2020-04-05 NOTE — Progress Notes (Deleted)
Cardiology Office Note:    Date:  04/05/2020   ID:  Rodney Barber, DOB [birth year], MRN [id number]  PCP:  [name], MD  [facility] Cardiologist:  Samentha Perham [place], MD  [facility] Electrophysiologist:  None   Referring MD: [name], MD   No chief complaint on file.   History of Present Illness:    Rodney Barber is a 54 y.o. male with a hx of uncontrolled DM, chronic pancreatitis, hypertension, tobacco abuse, noncompliance and the homelessness.  Previous echocardiogram obtained in October 2020 showed EF 60 to 65%, pericardial effusion posterior to the left ventricle. Repeat echocardiogram in March 2021 showed EF 40 to 45%, grade 1 DD, small filamentous mobile echodensity unchanged from the previous TEE.  He was admitted in May 2021 due to worsening shortness of breath.  Echocardiogram obtained in the hospital demonstrated new LV dysfunction with EF of 35 to 40%.  BNP was 550.  CT showed bilateral pleural effusion and coronary artery calcification.  He was treated for acute on chronic combined systolic and diastolic heart failure using IV Lasix.  Myoview performed on 01/01/2020 was high risk with large area of defect in the basal inferolateral, mid anteroseptal, mid inferolateral and apical septal location consistent with previous MI with peri-infarct ischemia.  He subsequently underwent cardiac catheterization on the following day on 5/28 which showed 30% proximal to distal LAD lesion, 60% mid to distal left circumflex lesion, 70% mid RCA lesion, EF was 25 to 35% by LV gram.  He had elevated LVEDP of 24 mmHg suggestive of volume overload.  His LV dysfunction was felt to be out of proportion to the degree of CAD, medical therapy was recommended for nonischemic cardiomyopathy.  He was placed on carvedilol, losartan, and Aldactone.  He was eventually discharged to [facility] house on 20 mg daily of torsemide.   Patient presents today for follow-up.  He denies significant shortness  of breath however he does have nasal drainage.  On physical exam, he appears to be euvolemic.  He says he has been compliant with medications which he [facility]  He has a follow-up next week with a primary care provider over there.  I plan to obtain a basic metabolic panel today to make sure his renal function and electrolyte are stable on the current diuretic.  He is also due for fasting lipid panel and fatigue in 6 to 8 weeks, this can be done on the next follow-up.  Otherwise, he likely will need a repeat echocardiogram after the next follow-up, I will defer this decision to Dr. Swaziland.     Past Medical History:  Diagnosis Date  . Diabetes mellitus   . Hypertension   . Pancreatitis     Past Surgical History:  Procedure Laterality Date  . ERCP  06/27/2011   Procedure: ENDOSCOPIC RETROGRADE CHOLANGIOPANCREATOGRAPHY (ERCP);  Surgeon: Petra Kuba, MD;  Location: [facility];  Service: Endoscopy;  Laterality: N/A;  . ERCP W/ PLASTIC STENT PLACEMENT  03/2011  . IR US GUIDE BX ASP/DRAIN  05/16/2019  . LEFT HEART CATH AND CORONARY ANGIOGRAPHY N/A 01/02/2020   Procedure: LEFT HEART CATH AND CORONARY ANGIOGRAPHY;  Surgeon: Sw[place]iland, Tameah Mihalko M, MD;  Location: [facility] INVASIVE CV LAB;  Service: Cardiovascular;  Laterality: N/A;  . TEE WITHOUT CARDIOVERSION N/A 05/20/2019   Procedure: TRANSESOPHAGEAL ECHOCARDIOGRAM (TEE);  Surgeon: Chrystie Nose, MD;  Location: [facility] ENDOSCOPY;  Service: Cardiovascular;  Laterality: N/A;    Current Medications: No outpatient medications  have been marked as taking for the 04/08/20 encounter (Appointment) with [place], Lanyia Jewel M, MD.     Allergies:   Patient has no known allergies.   Social History   Socioeconomic History  . Marital status: Single    Spouse name: Not on file  . Number of children: Not on file  . Years of education: Not on file  . Highest education level: Not on file  Occupational History  . Not on file  Tobacco Use  .  Smoking status: Current Every Day Smoker    Packs/day: 0.50    Years: 30.00    Pack years: 15.00    Types: Cigarettes  . Smokeless tobacco: Never Used  . Tobacco comment: 6 cigarett /day  Vaping Use  . Vaping Use: Never used  Substance and Sexual Activity  . Alcohol use: No  . Drug use: Not Currently    Types: "Crack" cocaine, Other-see comments, Cocaine    Comment: last smoked crack 12-16-16  . Sexual activity: Never  Other Topics Concern  . Not on file  Social History Narrative  . Not on file   Social Determinants of Health   Financial Resource Strain:   . Difficulty of Paying Living Expenses: Not on file  Food Insecurity:   . Worried About Programme researcher, broadcasting/film/video in the Last Year: Not on file  . Ran Out of Food in the Last Year: Not on file  Transportation Needs:   . Lack of Transportation (Medical): Not on file  . Lack of Transportation (Non-Medical): Not on file  Physical Activity:   . Days of Exercise per Week: Not on file  . Minutes of Exercise per Session: Not on file  Stress:   . Feeling of Stress : Not on file  Social Connections:   . Frequency of Communication with Friends and Family: Not on file  . Frequency of Social Gatherings with Friends and Family: Not on file  . Attends Religious Services: Not on file  . Active Member of Clubs or Organizations: Not on file  . Attends Banker Meetings: Not on file  . Marital Status: Not on file     Family History: The patient's family history includes Diabetes in his brother and mother.  ROS:   Please see the history of present illness.     All other systems reviewed and are negative.  EKGs/Labs/Other Studies Reviewed:    The following studies were reviewed today:  LHC 01/02/2020  Prox LAD to Dist LAD lesion is 30% stenosed.  Mid Cx to Dist Cx lesion is 60% stenosed.  Mid RCA lesion is 70% stenosed.  There is severe left ventricular systolic dysfunction.  LV end diastolic pressure is mildly  elevated.  The left ventricular ejection fraction is 25-35% by visual estimate.  1. Diffuse coronary atherosclerosis with heavy calcification. 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 Severe LV dysfunction EF estimated at 25-30% 3. Elevated EDP 24 mm Hg  Plan: aggressive medical therapy for CHF. Will add aldactone to Coreg, lasix, and losartan. Titrate as BP allows. High dose statin therapy. LV dysfunction is out of proportion to the degree of CAD.  TTE 12/28/2019 1. Left ventricular ejection fraction, by estimation, is 35 to 40%. The  left ventricle has moderately decreased function. The left ventricle  demonstrates global hypokinesis. There is moderate concentric left  ventricular hypertrophy. Left ventricular  diastolic function could not be evaluated.  2. Right ventricular systolic function is normal. The right ventricular  size is normal. There is normal pulmonary artery systolic pressure.  3. The mitral valve is normal in structure. Trivial mitral valve  regurgitation.  4. The aortic valve is tricuspid. Aortic valve regurgitation is not  visualized.  5. The inferior vena cava is dilated in size with >50% respiratory  variability, suggesting right atrial pressure of 8 mmHg.  EKG:  EKG is not ordered today.    Recent Labs: 10/21/2019: TSH 2.097 12/28/2019: B Natriuretic Peptide 544.4 12/30/2019: Magnesium 2.3 01/14/2020: Hemoglobin 9.3; Platelets 282 01/19/2020: ALT 28; BUN 22; Creatinine, Ser 1.14; Potassium 5.0; Sodium 130  Recent Lipid Panel    Component Value Date/Time   CHOL 162 01/19/2020 1141   TRIG 108 01/19/2020 1141   HDL 82 01/19/2020 1141   CHOLHDL 2.0 01/19/2020 1141   CHOLHDL 3.7 01/01/2020 0550   VLDL 20 01/01/2020 0550   LDLCALC 61 01/19/2020 1141    Physical Exam:    VS:  There were no vitals taken for this visit.    Wt Readings from Last 3 Encounters:  01/14/20 114 lb (51.7 kg)  01/06/20 115 lb 9.6 oz (52.4 kg)  12/10/19  137 lb (62.1 kg)     GEN:  Well nourished, well developed in no acute distress HEENT: Normal NECK: No JVD; No carotid bruits LYMPHATICS: No lymphadenopathy CARDIAC: RRR, no murmurs, rubs, gallops RESPIRATORY:  Clear to auscultation without rales, wheezing or rhonchi  ABDOMEN: Soft, non-tender, non-distended MUSCULOSKELETAL:  No edema; No deformity  SKIN: Warm and dry NEUROLOGIC:  Alert and oriented x 3 PSYCHIATRIC:  Normal affect   ASSESSMENT:    No diagnosis found. PLAN:    In order of problems listed above:  1. Chronic combined systolic and diastolic heart failure: Nonischemic CM. EF 35-40%. Euvolemic on physical exam.  Obtain basic metabolic panel to assess renal function and electrolyte.  2. CAD: Recent cardiac catheterization showed moderate disease, cardiomyopathy is likely nonischemic in nature.  On aspirin and Lipitor  3. Hyperlipidemia: Continue Lipitor 80 mg daily.  Will need repeat fasting lipid panel and LFT in 2 months  4. DM2: Uncontrolled, will need to follow-up with primary care doctor closely next week  5. Hypertension: Stable on current therapy.   Medication Adjustments/Labs and Tests Ordered: Current medicines are reviewed at length with the patient today.  Concerns regarding medicines are outlined above.  No orders of the defined types were placed in this encounter.  No orders of the defined types were placed in this encounter.   There are no Patient Instructions on file for this visit.   Signed, Darnel Mchan [place], MD  04/05/2020 7:42 AM    [facility]

## 2020-04-07 ENCOUNTER — Other Ambulatory Visit: Payer: Self-pay | Admitting: Critical Care Medicine

## 2020-04-07 ENCOUNTER — Encounter: Payer: Self-pay | Admitting: Critical Care Medicine

## 2020-04-07 ENCOUNTER — Telehealth: Payer: Self-pay | Admitting: Critical Care Medicine

## 2020-04-07 MED ORDER — ASPIRIN 81 MG PO CHEW
81.0000 mg | CHEWABLE_TABLET | Freq: Every day | ORAL | 0 refills | Status: DC
Start: 2020-04-07 — End: 2020-07-07

## 2020-04-07 MED ORDER — CARVEDILOL 25 MG PO TABS: 25.0000 mg | ORAL_TABLET | Freq: Two times a day (BID) | ORAL | 0 refills | Status: DC

## 2020-04-07 MED FILL — CARVEDILOL 25 MG TABLET: 25 | 30 days supply | Qty: 60 | Fill #0

## 2020-04-07 MED FILL — ASPIRIN CHILD 81 MG TAB CHE: 81 | 30 days supply | Qty: 30 | Fill #0

## 2020-04-07 NOTE — Progress Notes (Signed)
Patient ID: Rodney Barber, male   DOB: [birth year], 54 y.o.   MRN: [id number] I saw this patient briefly at the [facility] and refilled all of his medications into his pill organizer note this patient will be seen tomorrow at the mobile medicine clinic for complete lab drawl and I will be staffing that clinic  I will check his blood glucose at that time he also needs refills of his Coreg and also aspirin I will send was prescriptions in for that today

## 2020-04-07 NOTE — Telephone Encounter (Signed)
Called patient and LVM to return call and schedule an appt with Dr. [name].

## 2020-04-08 ENCOUNTER — Ambulatory Visit: Payer: Self-pay | Admitting: Cardiology

## 2020-04-13 NOTE — Progress Notes (Signed)
Cardiology Office Note:    Date:  04/19/2020   ID:  Rodney Barber, DOB Jan 28, 1966, MRN [id number]  PCP:  Rodney Frisk, MD  [facility] Cardiologist:  Rodney Glazer [place], MD  [facility] Electrophysiologist:  None   Referring MD: Rodney Frisk, MD   Chief Complaint  Patient presents with  . Congestive Heart Failure    History of Present Illness:    Rodney Barber is a 54 y.o. male with a hx of uncontrolled DM, chronic pancreatitis, hypertension, tobacco abuse, noncompliance and  homelessness.  Previous echocardiogram obtained in October 2020 showed EF 60 to 65%, pericardial effusion posterior to the left ventricle. Repeat echocardiogram in March 2021 showed EF 40 to 45%, grade 1 DD, small filamentous mobile echodensity unchanged from the previous TEE.  He was admitted in May 2021 due to worsening shortness of breath.  Echocardiogram obtained in the hospital demonstrated new LV dysfunction with EF of 35 to 40%.  BNP was 550.  CT showed bilateral pleural effusion and coronary artery calcification.  He was treated for acute on chronic combined systolic and diastolic heart failure using IV Lasix.  Myoview performed on 01/01/2020 was high risk with large area of defect in the basal inferolateral, mid anteroseptal, mid inferolateral and apical septal location consistent with previous MI with peri-infarct ischemia.  He subsequently underwent cardiac catheterization on the following day on 5/28 which showed 30% proximal to distal LAD lesion, 60% mid to distal left circumflex lesion, 70% mid RCA lesion, EF was 25 to 35% by LV gram.  He had elevated LVEDP of 24 mmHg suggestive of volume overload.  His LV dysfunction was felt to be out of proportion to the degree of CAD, medical therapy was recommended for nonischemic cardiomyopathy.  He was placed on carvedilol, losartan, and Aldactone.  He was eventually discharged to [facility] house on 20 mg daily of torsemide.   Patient presents today for  follow-up.  He denies significant shortness of breath but does state if he exerts himself he gets out of breath. He complains that his legs from the knees down stay cold and hurt. Not really worse with activity. No chest pain. Still smoking 3 cigs/day. No edema. Tolerating medication well and reports compliance.    Past Medical History:  Diagnosis Date  . Diabetes mellitus   . Hypertension   . Pancreatitis     Past Surgical History:  Procedure Laterality Date  . ERCP  06/27/2011   Procedure: ENDOSCOPIC RETROGRADE CHOLANGIOPANCREATOGRAPHY (ERCP);  Surgeon: Rodney Kuba, MD;  Location: [facility] ENDOSCOPY;  Service: Endoscopy;  Laterality: N/A;  . ERCP W/ PLASTIC STENT PLACEMENT  03/2011  . IR US GUIDE BX ASP/DRAIN  05/16/2019  . LEFT HEART CATH AND CORONARY ANGIOGRAPHY N/A 01/02/2020   Procedure: LEFT HEART CATH AND CORONARY ANGIOGRAPHY;  Surgeon: Barber, Rodney Skyles M, MD;  Location: [facility] INVASIVE CV LAB;  Service: Cardiovascular;  Laterality: N/A;  . TEE WITHOUT CARDIOVERSION N/A 05/20/2019   Procedure: TRANSESOPHAGEAL ECHOCARDIOGRAM (TEE);  Surgeon: Rodney Nose, MD;  Location: [facility] ENDOSCOPY;  Service: Cardiovascular;  Laterality: N/A;    Current Medications: Current Meds  Medication Sig  . aspirin 81 MG chewable tablet Chew 1 tablet (81 mg total) by mouth daily.  Marland Kitchen atorvastatin (LIPITOR) 80 MG tablet Take 1 tablet (80 mg total) by mouth daily at 6 PM.  . blood glucose meter kit and supplies KIT Dispense based on patient and insurance preference. Use up to four times daily as directed. (FOR ICD-9 250.00, 250.01).  Marland Kitchen  carvedilol (COREG) 25 MG tablet Take 1 tablet (25 mg total) by mouth 2 (two) times daily with a meal.  . feeding supplement, GLUCERNA SHAKE, (GLUCERNA SHAKE) LIQD Take 237 mLs by mouth 3 (three) times daily between meals.  . insulin detemir (LEVEMIR FLEXTOUCH) 100 UNIT/ML FlexPen Inject 35 Units into the skin 2 (two) times daily.  Marland Kitchen losartan (COZAAR) 100 MG tablet Take 1 tablet (100 mg  total) by mouth daily.  . Pancrelipase, Lip-Prot-Amyl, (CREON) 6000-19000 units CPEP Take 2 capsules (12,000 Units total) by mouth in the morning, at noon, and at bedtime.  . pantoprazole (PROTONIX) 40 MG tablet Take 1 tablet (40 mg total) by mouth daily.  Marland Kitchen spironolactone (ALDACTONE) 25 MG tablet Take 1 tablet (25 mg total) by mouth daily.  Marland Kitchen torsemide (DEMADEX) 20 MG tablet Take 1 tablet (20 mg total) by mouth daily.     Allergies:   Patient has no known allergies.   Social History   Socioeconomic History  . Marital status: Single    Spouse name: Not on file  . Number of children: Not on file  . Years of education: Not on file  . Highest education level: Not on file  Occupational History  . Not on file  Tobacco Use  . Smoking status: Current Every Day Smoker    Packs/day: 0.50    Years: 30.00    Pack years: 15.00    Types: Cigarettes  . Smokeless tobacco: Never Used  . Tobacco comment: 6 cigarett /day  Vaping Use  . Vaping Use: Never used  Substance and Sexual Activity  . Alcohol use: No  . Drug use: Not Currently    Types: "Crack" cocaine, Other-see comments, Cocaine    Comment: last smoked crack 12-16-16  . Sexual activity: Never  Other Topics Concern  . Not on file  Social History Narrative  . Not on file   Social Determinants of Health   Financial Resource Strain:   . Difficulty of Paying Living Expenses: Not on file  Food Insecurity:   . Worried About Programme researcher, broadcasting/film/video in the Last Year: Not on file  . Ran Out of Food in the Last Year: Not on file  Transportation Needs:   . Lack of Transportation (Medical): Not on file  . Lack of Transportation (Non-Medical): Not on file  Physical Activity:   . Days of Exercise per Week: Not on file  . Minutes of Exercise per Session: Not on file  Stress:   . Feeling of Stress : Not on file  Social Connections:   . Frequency of Communication with Friends and Family: Not on file  . Frequency of Social Gatherings with  Friends and Family: Not on file  . Attends Religious Services: Not on file  . Active Member of Clubs or Organizations: Not on file  . Attends Banker Meetings: Not on file  . Marital Status: Not on file     Family History: The patient's family history includes Diabetes in his brother and mother.  ROS:   Please see the history of present illness.     All other systems reviewed and are negative.  EKGs/Labs/Other Studies Reviewed:    The following studies were reviewed today:  LHC 01/02/2020  Prox LAD to Dist LAD lesion is 30% stenosed.  Mid Cx to Dist Cx lesion is 60% stenosed.  Mid RCA lesion is 70% stenosed.  There is severe left ventricular systolic dysfunction.  LV end diastolic pressure is mildly elevated.  The left ventricular ejection fraction is 25-35% by visual estimate.  1. Diffuse coronary atherosclerosis with heavy calcification. 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 Severe LV dysfunction EF estimated at 25-30% 3. Elevated EDP 24 mm Hg  Plan: aggressive medical therapy for CHF. Will add aldactone to Coreg, lasix, and losartan. Titrate as BP allows. High dose statin therapy. LV dysfunction is out of proportion to the degree of CAD.  TTE 12/28/2019 1. Left ventricular ejection fraction, by estimation, is 35 to 40%. The  left ventricle has moderately decreased function. The left ventricle  demonstrates global hypokinesis. There is moderate concentric left  ventricular hypertrophy. Left ventricular  diastolic function could not be evaluated.  2. Right ventricular systolic function is normal. The right ventricular  size is normal. There is normal pulmonary artery systolic pressure.  3. The mitral valve is normal in structure. Trivial mitral valve  regurgitation.  4. The aortic valve is tricuspid. Aortic valve regurgitation is not  visualized.  5. The inferior vena cava is dilated in size with >50% respiratory   variability, suggesting right atrial pressure of 8 mmHg.  EKG:  EKG is not ordered today.    Recent Labs: 10/21/2019: TSH 2.097 12/28/2019: B Natriuretic Peptide 544.4 12/30/2019: Magnesium 2.3 01/14/2020: Hemoglobin 9.3; Platelets 282 01/19/2020: ALT 28; BUN 22; Creatinine, Ser 1.14; Potassium 5.0; Sodium 130  Recent Lipid Panel    Component Value Date/Time   CHOL 162 01/19/2020 1141   TRIG 108 01/19/2020 1141   HDL 82 01/19/2020 1141   CHOLHDL 2.0 01/19/2020 1141   CHOLHDL 3.7 01/01/2020 0550   VLDL 20 01/01/2020 0550   LDLCALC 61 01/19/2020 1141    Physical Exam:    VS:  BP (!) 112/57   Pulse 76   Ht 5\' 8"  (1.727 Barber)   Wt 128 lb (58.1 kg)   SpO2 99%   BMI 19.46 kg/Barber     Wt Readings from Last 3 Encounters:  04/19/20 128 lb (58.1 kg)  01/14/20 114 lb (51.7 kg)  01/06/20 115 lb 9.6 oz (52.4 kg)     GEN:  Well nourished, thin,  in no acute distress HEENT: Normal NECK: No JVD; No carotid bruits LYMPHATICS: No lymphadenopathy CARDIAC: RRR, no murmurs, rubs, gallops RESPIRATORY:  Clear to auscultation without rales, wheezing or rhonchi  ABDOMEN: Soft, non-tender, non-distended MUSCULOSKELETAL:  No edema; No deformity  SKIN: Warm and dry NEUROLOGIC:  Alert and oriented x 3 PSYCHIATRIC:  Normal affect   ASSESSMENT:    1. Coronary artery disease involving native coronary artery of native heart without angina pectoris   2. Chronic combined systolic and diastolic heart failure (HCC)   3. Controlled type 2 diabetes mellitus without complication, unspecified whether long term insulin use (HCC)   4. Essential hypertension   5. Hyperlipidemia, unspecified hyperlipidemia type   6. Pain in both lower extremities    PLAN:    In order of problems listed above:  1. Chronic combined systolic and diastolic heart failure: Nonischemic CM. EF 35-40%. Euvolemic on physical exam.  On Coreg, losartan and aldactone. Will update Echo now.    2. CAD: cardiac catheterization showed  moderate disease, cardiomyopathy is nonischemic in nature.  No anginal symptoms. On aspirin and Lipitor  3. Hyperlipidemia: Continue Lipitor 80 mg daily.  Last LDL 61. Labs followed by [facility].   4. DM2: Uncontrolled, on insulin.   5. Hypertension: Stable on current therapy.  6.   Bilateral leg pain. At increased risk  for PAD due to DM and tobacco use. Will check LE arterial dopplers.   7.   Tobacco abuse. Recommend complete cessation.    Medication Adjustments/Labs and Tests Ordered: Current medicines are reviewed at length with the patient today.  Concerns regarding medicines are outlined above.  No orders of the defined types were placed in this encounter.  No orders of the defined types were placed in this encounter.   Patient Instructions  We will schedule you for an Echo cardiogram to check your heart function.  We will also order lower extremity arterial dopplers to check the circulation to your legs.   Continue your current therapy  Stop smoking.   Follow up in 6 months     Signed, Shawntelle Ungar [place], MD  04/19/2020 3:07 PM    [facility]

## 2020-04-15 ENCOUNTER — Encounter: Payer: Self-pay | Admitting: Critical Care Medicine

## 2020-04-15 ENCOUNTER — Other Ambulatory Visit: Payer: Self-pay | Admitting: Critical Care Medicine

## 2020-04-15 DIAGNOSIS — I10 Essential (primary) hypertension: Secondary | ICD-10-CM

## 2020-04-15 DIAGNOSIS — K219 Gastro-esophageal reflux disease without esophagitis: Secondary | ICD-10-CM

## 2020-04-15 MED ORDER — LOSARTAN POTASSIUM 100 MG PO TABS: 100.0000 mg | ORAL_TABLET | Freq: Every day | ORAL | 3 refills | Status: DC

## 2020-04-15 MED ORDER — PANTOPRAZOLE SODIUM 40 MG PO TBEC: 40.0000 mg | DELAYED_RELEASE_TABLET | Freq: Every day | ORAL | 3 refills | Status: DC

## 2020-04-16 MED FILL — LOSARTAN POTASSIUM 100 MG T: 100 | 30 days supply | Qty: 30 | Fill #0

## 2020-04-16 MED FILL — PANTOPRAZOLE SOD DR 40 MG T: 40 | 30 days supply | Qty: 30 | Fill #0

## 2020-04-16 NOTE — Progress Notes (Unsigned)
Patient ID: Rodney Barber, male   DOB: [birth year], 54 y.o.   MRN: [id number] This is a 54 year old male seen at the [facility] for his diabetes and heart disease.  I refilled his pill organizer's and will have it get him refills on Protonix and losartan all other medications are good for another week  I am going to get this patient into see me September 14 at 1:30 PM is a double book

## 2020-04-19 ENCOUNTER — Ambulatory Visit (INDEPENDENT_AMBULATORY_CARE_PROVIDER_SITE_OTHER): Payer: Self-pay | Admitting: Cardiology

## 2020-04-19 ENCOUNTER — Encounter: Payer: Self-pay | Admitting: Cardiology

## 2020-04-19 ENCOUNTER — Other Ambulatory Visit: Payer: Self-pay

## 2020-04-19 VITALS — BP 112/57 | HR 76 | Ht 68.0 in | Wt 128.0 lb

## 2020-04-19 DIAGNOSIS — I10 Essential (primary) hypertension: Secondary | ICD-10-CM

## 2020-04-19 DIAGNOSIS — I5042 Chronic combined systolic (congestive) and diastolic (congestive) heart failure: Secondary | ICD-10-CM

## 2020-04-19 DIAGNOSIS — E785 Hyperlipidemia, unspecified: Secondary | ICD-10-CM

## 2020-04-19 DIAGNOSIS — M79605 Pain in left leg: Secondary | ICD-10-CM

## 2020-04-19 DIAGNOSIS — I2510 Atherosclerotic heart disease of native coronary artery without angina pectoris: Secondary | ICD-10-CM

## 2020-04-19 DIAGNOSIS — E119 Type 2 diabetes mellitus without complications: Secondary | ICD-10-CM

## 2020-04-19 DIAGNOSIS — M79604 Pain in right leg: Secondary | ICD-10-CM

## 2020-04-19 NOTE — Patient Instructions (Addendum)
We will schedule you for an Echo cardiogram to check your heart function.  We will also order lower extremity arterial dopplers to check the circulation to your legs.   Continue your current therapy  Stop smoking.   Follow up in 6 months

## 2020-04-20 ENCOUNTER — Encounter: Payer: Self-pay | Admitting: Critical Care Medicine

## 2020-04-20 ENCOUNTER — Ambulatory Visit: Payer: Self-pay | Attending: Critical Care Medicine | Admitting: Critical Care Medicine

## 2020-04-20 VITALS — BP 91/51 | HR 76 | Temp 97.0°F | Resp 16 | Ht 68.0 in | Wt 127.6 lb

## 2020-04-20 DIAGNOSIS — I2510 Atherosclerotic heart disease of native coronary artery without angina pectoris: Secondary | ICD-10-CM | POA: Insufficient documentation

## 2020-04-20 DIAGNOSIS — K860 Alcohol-induced chronic pancreatitis: Secondary | ICD-10-CM

## 2020-04-20 DIAGNOSIS — E0865 Diabetes mellitus due to underlying condition with hyperglycemia: Secondary | ICD-10-CM

## 2020-04-20 DIAGNOSIS — IMO0002 Reserved for concepts with insufficient information to code with codable children: Secondary | ICD-10-CM

## 2020-04-20 DIAGNOSIS — E1165 Type 2 diabetes mellitus with hyperglycemia: Secondary | ICD-10-CM

## 2020-04-20 DIAGNOSIS — R7401 Elevation of levels of liver transaminase levels: Secondary | ICD-10-CM

## 2020-04-20 DIAGNOSIS — Z720 Tobacco use: Secondary | ICD-10-CM

## 2020-04-20 DIAGNOSIS — E785 Hyperlipidemia, unspecified: Secondary | ICD-10-CM

## 2020-04-20 DIAGNOSIS — I10 Essential (primary) hypertension: Secondary | ICD-10-CM

## 2020-04-20 DIAGNOSIS — Z23 Encounter for immunization: Secondary | ICD-10-CM

## 2020-04-20 DIAGNOSIS — I5042 Chronic combined systolic (congestive) and diastolic (congestive) heart failure: Secondary | ICD-10-CM

## 2020-04-20 DIAGNOSIS — K8689 Other specified diseases of pancreas: Secondary | ICD-10-CM

## 2020-04-20 DIAGNOSIS — E1169 Type 2 diabetes mellitus with other specified complication: Secondary | ICD-10-CM

## 2020-04-20 LAB — POCT GLYCOSYLATED HEMOGLOBIN (HGB A1C): Hemoglobin A1C: 14.5 % — AB (ref 4.0–5.6)

## 2020-04-20 LAB — GLUCOSE, POCT (MANUAL RESULT ENTRY): POC Glucose: 201 mg/dl — AB (ref 70–99)

## 2020-04-20 MED ORDER — LEVEMIR FLEXTOUCH 100 UNIT/ML ~~LOC~~ SOPN: 40.0000 [IU] | PEN_INJECTOR | Freq: Two times a day (BID) | SUBCUTANEOUS | 1 refills | Status: DC

## 2020-04-20 MED ORDER — INSULIN ASPART 100 UNIT/ML FLEXPEN
5.0000 [IU] | PEN_INJECTOR | Freq: Three times a day (TID) | SUBCUTANEOUS | 1 refills | Status: DC
Start: 2020-04-20 — End: 2020-05-19

## 2020-04-20 MED FILL — NOVOLOG FLEXPEN SYRINGE: 100 | 20 days supply | Qty: 3 | Fill #0

## 2020-04-20 MED FILL — LEVEMIR FLEXTOUCH 100 UNITS: 100 | 30 days supply | Qty: 24 | Fill #0

## 2020-04-20 NOTE — Progress Notes (Signed)
Wants flu vaccine today °

## 2020-04-20 NOTE — Assessment & Plan Note (Signed)
Uncontrolled type 2 diabetes due to dietary nonadherence and need for increased insulin dosing  Plan is to add NovoLog 5 units 3 times daily with each meal and to continue Levemir at 40 units twice daily

## 2020-04-20 NOTE — Assessment & Plan Note (Signed)
Continue atorvastatin as prescribed 

## 2020-04-20 NOTE — Assessment & Plan Note (Signed)
  .   Current smoking consumption amount: 4 cigarettes daily  . Dicsussion on advise to quit smoking and smoking impacts: Impact on cardiovascular health  . Patient's willingness to quit: Willing to cut back but not quit  . Methods to quit smoking discussed: Behavioral modification cannot use nicotine replacement therapy due to vascular disease  . Medication management of smoking session drugs discussed: Not a candidate for medication management Chantix is on backorder  . Resources provided:  AVS   . Setting quit date not yet established  . Follow-up arranged 2 months   Time spent counseling the patient:

## 2020-04-20 NOTE — Progress Notes (Signed)
Subjective:    Patient ID: Rodney Barber, male    DOB: [birth year], 54 y.o.   MRN: [id number]  12/09/19 This is a 54 year old male who seen previously in the [place] [facility] comes in today with recent hospitalization for hypoglycemia we was on 35 units of Levemir with not eating regularly.  Note he has not been using his pancreatic enzyme medicine regularly.  Also he is missed doses of lisinopril and amlodipine.  On exam blood pressure 186/94 glucose is greater than 400  Patient is thin and in no acute distress chest was clear abdomen was slightly tender extremities showed no edema  Impression is that of type 2 diabetes poorly controlled secondary to poor dietary habits  Also hypertension poorly controlled at this time  12/10/2019 This patient is seen for the first time in the [facility].  He previously been followed intermittently at the [facility].  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  He is also had very poor dietary habits and skips meals at times.  He had an episode of severe hypoglycemia on 30 units of Levemir daily and was admitted between the ninth and 10 April.  Below is the discharge summary.  Admitted early April hypoglycemia   Admit date: 11/14/2019 Discharge date: 11/15/2019  Admitted From: Shelter Disposition: Shelter  Recommendations for Outpatient Follow-up:  1. Follow up with PCP in 1-2 weeks 2. Please obtain BMP/CBC in one week  Discharge Condition: Stable CODE STATUS: Full Diet recommendation: Diabetic diet as tolerated  Brief/Interim Summary: Rodney Barber a 54 y.o.malewith medical history significant oftype 2 diabetes/IDDM, hypertension, pancreatitis, tobacco use, and homelessness who presents with hypoglycemia. Patient is alert and oriented at present but unable to give great recollection of this morning. He  states he recalls speaking with EMS and understands he is now at [facility] because of the low blood sugar. He states he was in usual state of health through last night and took his insulin in the evening as he normally does. However he cannot tell me what dose of insulin he takes. However he does state that he does not normally get a low blood sugar and has not had issues like this throughout the week. He denies any fevers or chills. He denies any new rashes, urinary symptoms, nausea vomiting or diarrhea. He reports he is otherwise taking his meds without assistance. He denies any other symptoms, no cough/shortness of breath, chest pain. Per EMS report, patient was altered and noted to be hypoglycemic to 35. He received D10, 250 cc in route and in the emergency room was again noted to be hypoglycemic to 30s. Patient reports using tobacco, approximately half pack per day. No alcohol, no drugs. He reports ambulating with the assistance of a walker, denies any dizziness lightheadedness or falls.  Patient admitted as above with acute symptomatic hypoglycemia in the setting of medication and dietary noncompliance.  Patient is more awake alert oriented this morning, we discussed his insulin regimen at length.  Patient declines any urinary symptoms, antibiotics will be discontinued appropriately.  It appears he has not been using his blood glucose monitor as he has run out of test strips and lancets, because of this he has been dosing his insulin regularly likely the reason for his A1c being quite elevated but being admitted for acute symptomatic hypoglycemia.  Given concern for ongoing hypoglycemia patient's Lantus has been decreased to 20 units nightly,  discussed that he needs to maintain his diabetic diet and take this medication every night, follow his Accu-Cheks as scheduled and follow-up with his PCP in the next 3 to 4 days.  He indicates his last office visit was supposed to be Wednesday but due to  transportation issues not being provided he was unable to point to their office.  Discussed that should he continue to be noncompliant or use medications inappropriately he had a markedly increased risk of death given his profound hypoglycemia.  Patient understood the risks of not continued medical compliance follow-up and medication administrations.  Patient is otherwise stable and agreeable for discharge home, ambulating without difficulty, tolerating p.o. quite well and glucose remains somewhat well controlled although minimally elevated in the setting of dextrose and hypoglycemic bolusing overnight.   Discharge Diagnoses:  Principal Problem:   Hypoglycemia Active Problems:   Severe malnutrition (HCC)   Protein-calorie malnutrition, severe (HCC)   Altered mental status   AMS (altered mental status)   Noncompliance with medications  Patient comes in today and still is running high blood sugars today his CBG is 340.  He has no longer drinking.  He is not eating healthy foods.  He is trying to eat some protein vegetables and occasionally bananas.  He also has severe dental issues with significant dental caries and periodontal disease.   04/20/2020 Since last OV in early May in hospital again later in may:  This patient was seen in follow-up last early made and subsequent to that was admitted for acute heart failure with coronary artery disease newly diagnosed.  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  Patient was seen by cardiology recently who did not make any medication changes  Below is the discharge summary from May Admit date: 12/28/2019 Discharge date: 01/06/2020  Time spent: 35 minutes  Recommendations for Outpatient Follow-up:  [facility], office to arrange follow-up [facility] on 6/15 at 11 AM Please check BMP in 1 week  Discharge Diagnoses:   Acute hypoxic respiratory failure Acute systolic and diastolic CHF, EF of 83% Coronary artery disease Chronic alcoholic pancreatitis EtOH abuse Transudative pleural effusions Type 2 diabetes mellitus on insulin Homelessness   Hyperglycemia   Tobacco abuse   Acute respiratory failure with hypoxia (HCC)   S/P thoracentesis   Acute on chronic combined systolic and diastolic CHF (congestive heart failure) ([place])   Essential hypertension   Abnormal nuclear stress test   Elevated brain natriuretic peptide (BNP) level   Discharge Condition: Improved  Diet recommendation: Low-sodium, diabetic, heart healthy  Filed Weights  01/04/20 0014 01/05/20 0417 01/06/20 0423 Weight: 54.3 kg 53.3 kg 52.4 kg   History of present illness:  54 yo homeless male with history of alcoholic pancreatitis, htn, DM2 poorly controlled, presented to [facility] 5/22 with CC SOB, cough. In ED, pt hypoxic, SpO2 70s on RA. Initially, placed on NRB , progressive resp distress and hypoxia was intubated in the emergency room.  Hospital Course:   Acute hypoxemic respiratory failure -Acute systolic CHF, EF of 15% lower from prior -Admitted to the ICU with acute hypoxic respiratory failure requiring mechanical intubation, diuresed with IV Lasix, extubated 5/25 -Status post bilateral thoracentesis 5/25, fluid was transudative -Followed by cardiology this admission -Clinically improved with diuresis, work-up noted echo with drop in EF down to 35%, subsequently underwent Lexiscan Myoview which was abnormal -Then had left heart catheterization-noted 70% RCA  disease and otherwise nonobstructive CAD, medical management was recommended -Clinically improved and stable now, transition to oral torsemide and Aldactone, also started on carvedilol, losartan -Discharged back to shelter today -Follow-up arranged with [facility] and [facility] to arrange follow-up as well, needs BMP in 1 week, importance of  compliance with medications, salt restriction, diabetic diet and follow-up emphasized  Chronic acoholic pancreatitis  -Resume Creon  COPD/heavy tobacco abuse -Smoking 3 packs/day, counseled -Nebs PRN  DM2brittle -Brittle diabetic, with significant fluctuations in blood sugars noted -Subsequently transitioned to Levemir 15 units in the morning and 12 at bedtime  Homelessness -Followed by social work inpatient, patient is uninsured without family support, he will be discharged back to the shelter at [place]  Chronic anemia -Stable  H/o HTN -BP stable, monitor   Pt followed closely since that time in [place] clinic  Wt Readings from Last 3 Encounters: 04/20/20 : 127 lb 9.6 oz (57.9 kg) 04/19/20 : 128 lb (58.1 kg) 01/14/20 : 114 lb (51.7 kg)  On arrival blood sugar is over 200 A1c is 14.9   Past Medical History:  Diagnosis Date  . Diabetes mellitus   . Hypertension   . Pancreatitis      Family History  Problem Relation Age of Onset  . Diabetes Mother   . Diabetes Brother      Social History   Socioeconomic History  . Marital status: Single    Spouse name: Not on file  . Number of children: Not on file  . Years of education: Not on file  . Highest education level: Not on file  Occupational History  . Not on file  Tobacco Use  . Smoking status: Current Every Day Smoker    Packs/day: 0.50    Years: 30.00    Pack years: 15.00    Types: Cigarettes  . Smokeless tobacco: Never Used  . Tobacco comment: 6 cigarett /day  Vaping Use  . Vaping Use: Never used  Substance and Sexual Activity  . Alcohol use: No  . Drug use: Not Currently    Types: "Crack" cocaine, Other-see comments, Cocaine    Comment: last smoked crack 12-16-16  . Sexual activity: Never  Other Topics Concern  . Not on file  Social History Narrative  . Not on file   Social Determinants of Health   Financial Resource Strain:   . Difficulty of Paying Living Expenses: Not  on file  Food Insecurity:   . Worried About Charity fundraiser in the Last Year: Not on file  . Ran Out of Food in the Last Year: Not on file  Transportation Needs:   . Lack of Transportation (Medical): Not on file  . Lack of Transportation (Non-Medical): Not on file  Physical Activity:   . Days of Exercise per Week: Not on file  . Minutes of Exercise per Session: Not on file  Stress:   . Feeling of Stress : Not on file  Social Connections:   . Frequency of Communication with Friends and Family: Not on file  . Frequency of Social Gatherings with Friends and Family: Not on file  . Attends Religious Services: Not on file  . Active Member of Clubs or Organizations: Not on file  . Attends Archivist Meetings: Not on file  . Marital Status: Not on file  Intimate Partner Violence:   . Fear of Current or Ex-Partner: Not on file  . Emotionally Abused: Not on file  .  Physically Abused: Not on file  . Sexually Abused: Not on file     No Known Allergies   Outpatient Medications Prior to Visit  Medication Sig Dispense Refill  . aspirin 81 MG chewable tablet Chew 1 tablet (81 mg total) by mouth daily. 120 tablet 0  . blood glucose meter kit and supplies KIT Dispense based on patient and insurance preference. Use up to four times daily as directed. (FOR ICD-9 250.00, 250.01). 1 each 0  . carvedilol (COREG) 25 MG tablet Take 1 tablet (25 mg total) by mouth 2 (two) times daily with a meal. 60 tablet 0  . feeding supplement, GLUCERNA SHAKE, (GLUCERNA SHAKE) LIQD Take 237 mLs by mouth 3 (three) times daily between meals.  0  . losartan (COZAAR) 100 MG tablet Take 1 tablet (100 mg total) by mouth daily. 30 tablet 3  . Pancrelipase, Lip-Prot-Amyl, (CREON) 6000-19000 units CPEP Take 2 capsules (12,000 Units total) by mouth in the morning, at noon, and at bedtime. 360 capsule 0  . pantoprazole (PROTONIX) 40 MG tablet Take 1 tablet (40 mg total) by mouth daily. 30 tablet 3  . spironolactone  (ALDACTONE) 25 MG tablet Take 1 tablet (25 mg total) by mouth daily. 30 tablet 2  . torsemide (DEMADEX) 20 MG tablet Take 1 tablet (20 mg total) by mouth daily. 90 tablet 0  . insulin detemir (LEVEMIR FLEXTOUCH) 100 UNIT/ML FlexPen Inject 35 Units into the skin 2 (two) times daily. (Patient taking differently: Inject 40 Units into the skin 2 (two) times daily. ) 21 mL 3  . atorvastatin (LIPITOR) 80 MG tablet Take 1 tablet (80 mg total) by mouth daily at 6 PM. 30 tablet 3   No facility-administered medications prior to visit.      Review of Systems  Constitutional: Positive for activity change and unexpected weight change. Negative for appetite change and fatigue.  HENT: Positive for dental problem. Negative for sinus pressure and sinus pain.   Respiratory: Negative.   Cardiovascular: Negative for chest pain, palpitations and leg swelling.  Gastrointestinal: Negative.   Endocrine: Negative for polydipsia and polyuria.  Genitourinary: Negative for enuresis and frequency.  Musculoskeletal: Negative.   Neurological: Negative for light-headedness.  Hematological: Negative.   Psychiatric/Behavioral: Negative.        Objective:   Physical Exam Vitals:   04/20/20 1342  BP: (!) 91/51  Pulse: 76  Resp: 16  Temp: (!) 97 F (36.1 C)  TempSrc: Temporal  SpO2: 99%  Weight: 127 lb 9.6 oz (57.9 kg)  Height: 5' 8"  (1.727 m)    Gen: Pleasant, cachectic, in no distress,  normal affect  ENT: No lesions,  mouth clear,  oropharynx clear, no postnasal drip  Neck: No JVD, no TMG, no carotid bruits  Lungs: No use of accessory muscles, no dullness to percussion, clear without rales or rhonchi  Cardiovascular: RRR, heart sounds normal, no murmur or gallops, no peripheral edema  Abdomen: soft and NT, no HSM,  BS normal  Musculoskeletal: No deformities, no cyanosis or clubbing  Neuro: alert, non focal  Skin: Warm, no lesions or rashes  CBG 340  Foot exam shows pulses intact severe dry  skin sensation intact fungal involvement of toenails no ulceration or other lesions     Assessment & Plan:  I personally reviewed all images and lab data in the [facility] system as well as any outside material available during this office visit and agree with the  radiology impressions.   Uncontrolled type 2 diabetes mellitus (  [place]) Uncontrolled type 2 diabetes due to dietary nonadherence and need for increased insulin dosing  Plan is to add NovoLog 5 units 3 times daily with each meal and to continue Levemir at 40 units twice daily  Hyperlipidemia associated with type 2 diabetes mellitus ([place]) Continue atorvastatin as prescribed  Coronary artery disease No change in cardiac medications  Chronic combined systolic (congestive) and diastolic (congestive) heart failure [facility]) Cardiology planning repeat echo and lower extremity arterial Dopplers  No change in cardiac meds  Transaminitis Follow-up liver function profile  Tobacco abuse    . Current smoking consumption amount: 4 cigarettes daily  . Dicsussion on advise to quit smoking and smoking impacts: Impact on cardiovascular health  . Patient's willingness to quit: Willing to cut back but not quit  . Methods to quit smoking discussed: Behavioral modification cannot use nicotine replacement therapy due to vascular disease  . Medication management of smoking session drugs discussed: Not a candidate for medication management Chantix is on backorder  . Resources provided:  AVS   . Setting quit date not yet established  . Follow-up arranged 2 months   Time spent counseling the patient:      Shadoe was seen today for follow-up.  Diagnoses and all orders for this visit:  Alcohol-induced chronic pancreatitis ([place]) -     Lipase  Essential hypertension -     Comprehensive metabolic panel -     CBC with Differential/Platelet  Chronic combined systolic (congestive) and diastolic (congestive) heart failure (HCC) -      Comprehensive metabolic panel -     Lipid panel  Uncontrolled type 2 diabetes mellitus with hyperglycemia (HCC) -     HgB A1c -     Comprehensive metabolic panel -     Lipid panel -     Glucose (CBG)  Coronary artery disease involving native coronary artery of native heart without angina pectoris  Uncontrolled diabetes mellitus secondary to pancreatic insufficiency (HCC) -     insulin detemir (LEVEMIR FLEXTOUCH) 100 UNIT/ML FlexPen; Inject 40 Units into the skin 2 (two) times daily.  Need for immunization against influenza -     Flu Vaccine QUAD 36+ mos IM  Hyperlipidemia associated with type 2 diabetes mellitus (HCC)  Transaminitis  Tobacco abuse  Other orders -     insulin aspart (NOVOLOG) 100 UNIT/ML FlexPen; Inject 5 Units into the skin 3 (three) times daily with meals.  A tetanus vaccine was given at this visit

## 2020-04-20 NOTE — Patient Instructions (Signed)
Flu vaccine given  Stay on levemir 40units two times a day  Start novolog 5 units with meals three times per day  Inject on your abdomen  I sent refills on your protonix and losartan  Dr [name] will bring refills to you tomorrow  Labs today

## 2020-04-20 NOTE — Assessment & Plan Note (Signed)
Cardiology planning repeat echo and lower extremity arterial Dopplers  No change in cardiac meds

## 2020-04-20 NOTE — Assessment & Plan Note (Signed)
No change in cardiac medications.

## 2020-04-20 NOTE — Assessment & Plan Note (Signed)
Follow-up liver function profile

## 2020-04-21 ENCOUNTER — Encounter: Payer: Self-pay | Admitting: Critical Care Medicine

## 2020-04-21 ENCOUNTER — Other Ambulatory Visit: Payer: Self-pay | Admitting: Critical Care Medicine

## 2020-04-21 DIAGNOSIS — IMO0002 Reserved for concepts with insufficient information to code with codable children: Secondary | ICD-10-CM

## 2020-04-21 DIAGNOSIS — E785 Hyperlipidemia, unspecified: Secondary | ICD-10-CM

## 2020-04-21 LAB — LIPID PANEL
Chol/HDL Ratio: 3.3 ratio (ref 0.0–5.0)
Cholesterol, Total: 105 mg/dL (ref 100–199)
HDL: 32 mg/dL — ABNORMAL LOW (ref >39)
LDL Chol Calc (NIH): 48 mg/dL (ref 0–99)
Triglycerides: 142 mg/dL (ref 0–149)
VLDL Cholesterol Cal: 25 mg/dL (ref 5–40)

## 2020-04-21 LAB — CBC WITH DIFFERENTIAL/PLATELET
Basophils Absolute: 0.1 10*3/uL (ref 0.0–0.2)
Basos: 1 %
EOS (ABSOLUTE): 0.5 10*3/uL — ABNORMAL HIGH (ref 0.0–0.4)
Eos: 6 %
Hematocrit: 28.6 % — ABNORMAL LOW (ref 37.5–51.0)
Hemoglobin: 9.3 g/dL — ABNORMAL LOW (ref 13.0–17.7)
Immature Grans (Abs): 0 10*3/uL (ref 0.0–0.1)
Immature Granulocytes: 0 %
Lymphocytes Absolute: 1.9 10*3/uL (ref 0.7–3.1)
Lymphs: 23 %
MCH: 28.5 pg (ref 26.6–33.0)
MCHC: 32.5 g/dL (ref 31.5–35.7)
MCV: 88 fL (ref 79–97)
Monocytes Absolute: 0.6 10*3/uL (ref 0.1–0.9)
Monocytes: 7 %
Neutrophils Absolute: 5.3 10*3/uL (ref 1.4–7.0)
Neutrophils: 63 %
Platelets: 209 10*3/uL (ref 150–450)
RBC: 3.26 x10E6/uL — ABNORMAL LOW (ref 4.14–5.80)
RDW: 14.7 % (ref 11.6–15.4)
WBC: 8.4 10*3/uL (ref 3.4–10.8)

## 2020-04-21 LAB — COMPREHENSIVE METABOLIC PANEL
ALT: 30 IU/L (ref 0–44)
AST: 24 IU/L (ref 0–40)
Albumin/Globulin Ratio: 1.5 (ref 1.2–2.2)
Albumin: 4 g/dL (ref 3.8–4.9)
Alkaline Phosphatase: 96 IU/L (ref 44–121)
BUN/Creatinine Ratio: 26 — ABNORMAL HIGH (ref 9–20)
BUN: 29 mg/dL — ABNORMAL HIGH (ref 6–24)
Bilirubin Total: 0.2 mg/dL (ref 0.0–1.2)
CO2: 24 mmol/L (ref 20–29)
Calcium: 9.5 mg/dL (ref 8.7–10.2)
Chloride: 105 mmol/L (ref 96–106)
Creatinine, Ser: 1.12 mg/dL (ref 0.76–1.27)
GFR calc Af Amer: 85 mL/min/{1.73_m2} (ref >59)
GFR calc non Af Amer: 74 mL/min/{1.73_m2} (ref >59)
Globulin, Total: 2.7 g/dL (ref 1.5–4.5)
Glucose: 207 mg/dL — ABNORMAL HIGH (ref 65–99)
Potassium: 4.7 mmol/L (ref 3.5–5.2)
Sodium: 141 mmol/L (ref 134–144)
Total Protein: 6.7 g/dL (ref 6.0–8.5)

## 2020-04-21 LAB — LIPASE: Lipase: 7 U/L — ABNORMAL LOW (ref 13–78)

## 2020-04-21 MED ORDER — ATORVASTATIN CALCIUM 80 MG PO TABS: 80.0000 mg | ORAL_TABLET | Freq: Every day | ORAL | 3 refills | Status: DC

## 2020-04-21 MED ORDER — FEOSOL 200 (65 FE) MG PO TABS
1.0000 | ORAL_TABLET | Freq: Two times a day (BID) | ORAL | 3 refills | Status: DC
Start: 2020-04-21 — End: 2020-06-09

## 2020-04-21 NOTE — Progress Notes (Signed)
Patient ID: Rodney Barber, male   DOB: 1966-07-29, 53 y.o.   MRN: [id number] This patient is [facility] back [facility] his office visit was 1 day ago see those notes  Labs showed normal renal function blood sugar 209 normal liver function hemoglobin 9.3 lipase was normal lipids were perfect  I have increased him to NovoLog 5 units 3 times daily and Levemir 40 units twice daily.  I refilled his pill organizer and he will continue his current program I also brought him Glucerna today

## 2020-04-21 NOTE — Progress Notes (Signed)
Refill

## 2020-04-21 NOTE — Progress Notes (Signed)
For low iron

## 2020-04-22 ENCOUNTER — Telehealth: Payer: Self-pay

## 2020-04-22 MED FILL — ATORVASTATIN 80 MG TABLET: 80 | 30 days supply | Qty: 30 | Fill #0

## 2020-04-22 NOTE — Telephone Encounter (Signed)
This CM spoke to [employer].  She said that the [facility] has been working with the patient on his disability application.    This CM contacted [facility] who stated : My assistant checked on 04/02/2020 and Disability Determination Services said they were waiting on some records from [facility]. We have sent several records in for him but they still have to wait on any requests they send to [facility].   This information was shared with [facility].

## 2020-04-26 ENCOUNTER — Other Ambulatory Visit (HOSPITAL_COMMUNITY): Payer: Self-pay | Admitting: Cardiology

## 2020-04-26 DIAGNOSIS — M79604 Pain in right leg: Secondary | ICD-10-CM

## 2020-05-04 ENCOUNTER — Other Ambulatory Visit: Payer: Self-pay

## 2020-05-04 ENCOUNTER — Ambulatory Visit (HOSPITAL_COMMUNITY): Payer: Self-pay | Attending: Cardiology

## 2020-05-04 DIAGNOSIS — M79604 Pain in right leg: Secondary | ICD-10-CM | POA: Insufficient documentation

## 2020-05-04 DIAGNOSIS — I5042 Chronic combined systolic (congestive) and diastolic (congestive) heart failure: Secondary | ICD-10-CM | POA: Insufficient documentation

## 2020-05-04 DIAGNOSIS — M79605 Pain in left leg: Secondary | ICD-10-CM | POA: Insufficient documentation

## 2020-05-04 DIAGNOSIS — I2510 Atherosclerotic heart disease of native coronary artery without angina pectoris: Secondary | ICD-10-CM | POA: Insufficient documentation

## 2020-05-04 DIAGNOSIS — E785 Hyperlipidemia, unspecified: Secondary | ICD-10-CM | POA: Insufficient documentation

## 2020-05-04 DIAGNOSIS — I10 Essential (primary) hypertension: Secondary | ICD-10-CM | POA: Insufficient documentation

## 2020-05-04 DIAGNOSIS — E119 Type 2 diabetes mellitus without complications: Secondary | ICD-10-CM | POA: Insufficient documentation

## 2020-05-04 LAB — ECHOCARDIOGRAM COMPLETE
Area-P 1/2: 2.76 cm2
S' Lateral: 3.9 cm

## 2020-05-05 ENCOUNTER — Other Ambulatory Visit: Payer: Self-pay | Admitting: Critical Care Medicine

## 2020-05-05 ENCOUNTER — Encounter: Payer: Self-pay | Admitting: Critical Care Medicine

## 2020-05-05 DIAGNOSIS — I5043 Acute on chronic combined systolic (congestive) and diastolic (congestive) heart failure: Secondary | ICD-10-CM

## 2020-05-05 MED ORDER — TORSEMIDE 20 MG PO TABS: 20.0000 mg | ORAL_TABLET | Freq: Every day | ORAL | 0 refills | Status: DC

## 2020-05-05 MED ORDER — SPIRONOLACTONE 25 MG PO TABS: 25.0000 mg | ORAL_TABLET | Freq: Every day | ORAL | 2 refills | Status: DC

## 2020-05-05 MED ORDER — CARVEDILOL 25 MG PO TABS
25.0000 mg | ORAL_TABLET | Freq: Two times a day (BID) | ORAL | 0 refills | Status: DC
Start: 2020-05-05 — End: 2020-06-09

## 2020-05-05 MED FILL — TORSEMIDE 20 MG TABLET: 20 | 30 days supply | Qty: 30 | Fill #0

## 2020-05-05 MED FILL — SPIRONOLACTONE 25 MG TABS: 25 | 30 days supply | Qty: 30 | Fill #0

## 2020-05-05 MED FILL — CARVEDILOL 25 MG TABLET: 25 | 30 days supply | Qty: 60 | Fill #0

## 2020-05-06 ENCOUNTER — Other Ambulatory Visit: Payer: Self-pay | Admitting: Critical Care Medicine

## 2020-05-06 MED ORDER — INSULIN PEN NEEDLE 29G X 5MM MISC: 1 refills | Status: DC

## 2020-05-06 MED ORDER — GLUCOSE 4-6 GM-MG PO CHEW: CHEWABLE_TABLET | ORAL | 0 refills | Status: AC

## 2020-05-06 MED FILL — TRUEplus GLUCOSE CHEW: 15 days supply | Qty: 100 | Fill #0

## 2020-05-06 MED FILL — UNIFINE PENTIPS 32GX5/32: 32G X 4 MM | 30 days supply | Qty: 100 | Fill #0

## 2020-05-06 NOTE — Progress Notes (Signed)
Patient ID: Rodney Barber, male   DOB: Aug 24, 1965, 55 y.o.   MRN: [id number] This is a 54 year old male with type 2 diabetes from chronic pancreatitis he had a hypoglycemic episode during the night blood sugars in the 30s when this patient skipped several meals.  I instructed the patient to eat meals on a more regular basis have given him additional Glucerna samples he needs refills on Aldactone torsemide and Coreg no today his blood sugar was in the 400 range

## 2020-05-10 ENCOUNTER — Ambulatory Visit (HOSPITAL_COMMUNITY)
Admission: RE | Admit: 2020-05-10 | Payer: Self-pay | Source: Ambulatory Visit | Attending: Cardiology | Admitting: Cardiology

## 2020-05-10 ENCOUNTER — Emergency Department (HOSPITAL_COMMUNITY)
Admission: EM | Admit: 2020-05-10 | Discharge: 2020-05-10 | Disposition: A | Discharge status: Home / Self Care | Payer: Self-pay | Attending: Emergency Medicine | Admitting: Emergency Medicine

## 2020-05-10 ENCOUNTER — Encounter (HOSPITAL_COMMUNITY): Payer: Self-pay | Admitting: *Deleted

## 2020-05-10 ENCOUNTER — Other Ambulatory Visit: Payer: Self-pay

## 2020-05-10 DIAGNOSIS — Z794 Long term (current) use of insulin: Secondary | ICD-10-CM | POA: Insufficient documentation

## 2020-05-10 DIAGNOSIS — Z79899 Other long term (current) drug therapy: Secondary | ICD-10-CM | POA: Insufficient documentation

## 2020-05-10 DIAGNOSIS — F17210 Nicotine dependence, cigarettes, uncomplicated: Secondary | ICD-10-CM | POA: Insufficient documentation

## 2020-05-10 DIAGNOSIS — Z7982 Long term (current) use of aspirin: Secondary | ICD-10-CM | POA: Insufficient documentation

## 2020-05-10 DIAGNOSIS — E1169 Type 2 diabetes mellitus with other specified complication: Secondary | ICD-10-CM | POA: Insufficient documentation

## 2020-05-10 DIAGNOSIS — I5042 Chronic combined systolic (congestive) and diastolic (congestive) heart failure: Secondary | ICD-10-CM | POA: Insufficient documentation

## 2020-05-10 DIAGNOSIS — I951 Orthostatic hypotension: Secondary | ICD-10-CM | POA: Insufficient documentation

## 2020-05-10 DIAGNOSIS — I110 Hypertensive heart disease with heart failure: Secondary | ICD-10-CM | POA: Insufficient documentation

## 2020-05-10 DIAGNOSIS — I2510 Atherosclerotic heart disease of native coronary artery without angina pectoris: Secondary | ICD-10-CM | POA: Insufficient documentation

## 2020-05-10 LAB — HEPATIC FUNCTION PANEL
ALT: 20 U/L (ref 0–44)
AST: 19 U/L (ref 15–41)
Albumin: 3.6 g/dL (ref 3.5–5.0)
Alkaline Phosphatase: 58 U/L (ref 38–126)
Bilirubin, Direct: <0.1 mg/dL (ref 0.0–0.2)
Total Bilirubin: 0.3 mg/dL (ref 0.3–1.2)
Total Protein: 6.4 g/dL — ABNORMAL LOW (ref 6.5–8.1)

## 2020-05-10 LAB — BASIC METABOLIC PANEL
Anion gap: 10 (ref 5–15)
BUN: 23 mg/dL — ABNORMAL HIGH (ref 6–20)
CO2: 25 mmol/L (ref 22–32)
Calcium: 8.7 mg/dL — ABNORMAL LOW (ref 8.9–10.3)
Chloride: 103 mmol/L (ref 98–111)
Creatinine, Ser: 1.35 mg/dL — ABNORMAL HIGH (ref 0.61–1.24)
GFR calc Af Amer: >60 mL/min (ref >60)
GFR calc non Af Amer: 59 mL/min — ABNORMAL LOW (ref >60)
Glucose, Bld: 103 mg/dL — ABNORMAL HIGH (ref 70–99)
Potassium: 3.6 mmol/L (ref 3.5–5.1)
Sodium: 138 mmol/L (ref 135–145)

## 2020-05-10 LAB — CBC
HCT: 26.4 % — ABNORMAL LOW (ref 39.0–52.0)
Hemoglobin: 8.4 g/dL — ABNORMAL LOW (ref 13.0–17.0)
MCH: 29.1 pg (ref 26.0–34.0)
MCHC: 31.8 g/dL (ref 30.0–36.0)
MCV: 91.3 fL (ref 80.0–100.0)
Platelets: 204 10*3/uL (ref 150–400)
RBC: 2.89 MIL/uL — ABNORMAL LOW (ref 4.22–5.81)
RDW: 14.4 % (ref 11.5–15.5)
WBC: 7.5 10*3/uL (ref 4.0–10.5)
nRBC: 0 % (ref 0.0–0.2)

## 2020-05-10 LAB — CBG MONITORING, ED: Glucose-Capillary: 154 mg/dL — ABNORMAL HIGH (ref 70–99)

## 2020-05-10 LAB — LIPASE, BLOOD: Lipase: 15 U/L (ref 11–51)

## 2020-05-10 MED ORDER — SODIUM CHLORIDE 0.9 % IV BOLUS
1000.0000 mL | Freq: Once | INTRAVENOUS | Status: AC
  Administered 2020-05-10: 1000 mL via INTRAVENOUS

## 2020-05-10 NOTE — Discharge Instructions (Addendum)
Make sure to drink plenty of fluids.  Continue current medications.  Follow-up with your doctor later this week or next week to be rechecked.  Return to the ED for recurrent symptoms

## 2020-05-10 NOTE — ED Notes (Signed)
Patient aware we need urine sample, urinal at bedside.  

## 2020-05-10 NOTE — ED Provider Notes (Signed)
[facility] DEPT Provider Note   CSN: [id number] Arrival date & time: 05/10/20  1304     History Chief Complaint  Patient presents with  . Dizziness    Rodney Barber is a 54 y.o. male.  HPI   Pt woke up feeling fine.  He had lunch and then started to feel nauseated after that.   Pt then went to the bathroom.  He started to feel as if he was going to pass out and fall.  He stood up with his walker and felt like he was off balance and going to fall.  No trouble with speech or vision.  EMS was called.  They noticed his blood pressure was low. He was given IV fluids.  Sx have resolved.  No blood in stool.  No vomiting of blood.  No cp or abdpain.  Vaccinated for covid  Past Medical History:  Diagnosis Date  . Diabetes mellitus   . Hypertension   . Pancreatitis     Patient Active Problem List   Diagnosis Date Noted  . Coronary artery disease 04/20/2020  . S/P thoracentesis   . Chronic combined systolic (congestive) and diastolic (congestive) heart failure ([place])   . Essential hypertension   . Transaminitis 09/10/2019  . Tobacco abuse 05/14/2019  . Substance use disorder 02/18/2019  . Uncontrolled type 2 diabetes mellitus ([place]) 03/15/2018  . Hyperlipidemia associated with type 2 diabetes mellitus ([place]) 09/08/2013  . Alcohol-induced chronic pancreatitis ([place]) 07/22/2013  . Severe malnutrition ([place]) 07/11/2013  . Protein-calorie malnutrition, severe ([place]) 07/11/2013    Past Surgical History:  Procedure Laterality Date  . ERCP  06/27/2011   Procedure: ENDOSCOPIC RETROGRADE CHOLANGIOPANCREATOGRAPHY (ERCP);  Surgeon: Jeryl Columbia, MD;  Location: [facility];  Service: Endoscopy;  Laterality: N/A;  . ERCP W/ PLASTIC STENT PLACEMENT  03/2011  . IR US GUIDE BX ASP/DRAIN  05/16/2019  . LEFT HEART CATH AND CORONARY ANGIOGRAPHY N/A 01/02/2020   Procedure: LEFT HEART CATH AND CORONARY ANGIOGRAPHY;  Surgeon: Martinique, Peter M, MD;  Location: [facility];  Service: Cardiovascular;  Laterality: N/A;  . TEE WITHOUT CARDIOVERSION N/A 05/20/2019   Procedure: TRANSESOPHAGEAL ECHOCARDIOGRAM (TEE);  Surgeon: [name], MD;  Location: [facility] ENDOSCOPY;  Service: Cardiovascular;  Laterality: N/A;       Family History  Problem Relation Age of Onset  . Diabetes Mother   . Diabetes Brother     Social History   Tobacco Use  . Smoking status: Current Every Day Smoker    Packs/day: 0.50    Years: 30.00    Pack years: 15.00    Types: Cigarettes  . Smokeless tobacco: Never Used  . Tobacco comment: 6 cigarett /day  Vaping Use  . Vaping Use: Never used  Substance Use Topics  . Alcohol use: No  . Drug use: Not Currently    Types: "Crack" cocaine, Other-see comments, Cocaine    Comment: last smoked crack 12-16-16    Home Medications Prior to Admission medications   Medication Sig Start Date End Date Taking? Authorizing Provider  aspirin 81 MG chewable tablet Chew 1 tablet (81 mg total) by mouth daily. 04/07/20  Yes Elsie Stain, MD  atorvastatin (LIPITOR) 80 MG tablet Take 1 tablet (80 mg total) by mouth daily at 6 PM. 04/21/20 05/21/20 Yes Elsie Stain, MD  carvedilol (COREG) 25 MG tablet Take 1 tablet (25 mg total) by mouth 2 (two) times daily with a meal. 05/05/20  Yes Elsie Stain,  MD  feeding supplement, GLUCERNA SHAKE, (GLUCERNA SHAKE) LIQD Take 237 mLs by mouth 3 (three) times daily between meals. 09/11/19  Yes Mariel Aloe, MD  Ferrous Sulfate Dried (FEOSOL) 200 (65 Fe) MG TABS Take 1 tablet by mouth 2 (two) times daily with a meal. 04/21/20  Yes Elsie Stain, MD  Glucose 4-6 GM-MG CHEW Chew one as needed for blood glucose < 70 Patient taking differently: Chew 1 tablet by mouth 2 (two) times daily as needed (low glucose). Chew one as needed for blood glucose < 70 05/06/20  Yes Elsie Stain, MD  insulin aspart (NOVOLOG) 100 UNIT/ML FlexPen Inject 5 Units into the skin 3 (three) times daily with meals. 04/20/20  05/20/20 Yes Elsie Stain, MD  insulin detemir (LEVEMIR FLEXTOUCH) 100 UNIT/ML FlexPen Inject 40 Units into the skin 2 (two) times daily. Patient taking differently: Inject 30 Units into the skin 2 (two) times daily.  04/20/20 06/19/20 Yes Elsie Stain, MD  losartan (COZAAR) 100 MG tablet Take 1 tablet (100 mg total) by mouth daily. 04/15/20  Yes Elsie Stain, MD  Pancrelipase, Lip-Prot-Amyl, (CREON) 6000-19000 units CPEP Take 2 capsules (12,000 Units total) by mouth in the morning, at noon, and at bedtime. 03/18/20 05/17/20 Yes Elsie Stain, MD  pantoprazole (PROTONIX) 40 MG tablet Take 1 tablet (40 mg total) by mouth daily. 04/15/20  Yes Elsie Stain, MD  spironolactone (ALDACTONE) 25 MG tablet Take 1 tablet (25 mg total) by mouth daily. 05/05/20  Yes Elsie Stain, MD  torsemide (DEMADEX) 20 MG tablet Take 1 tablet (20 mg total) by mouth daily. 05/05/20  Yes Elsie Stain, MD  blood glucose meter kit and supplies KIT Dispense based on patient and insurance preference. Use up to four times daily as directed. (FOR ICD-9 250.00, 250.01). 11/15/19   Little Ishikawa, MD  Insulin Pen Needle 29G X 5MM MISC Use with insulin pens 05/06/20   Elsie Stain, MD    Allergies    Patient has no known allergies.  Review of Systems   Review of Systems  All other systems reviewed and are negative.   Physical Exam Updated Vital Signs BP (!) 150/75   Pulse 82   Temp 98 F (36.7 C) (Oral)   Resp 18   Ht 1.727 m (_0 )   Wt 58 kg   SpO2 100%   BMI 19.44 kg/m   Physical Exam Vitals and nursing note reviewed.  Constitutional:      General: He is not in acute distress.    Appearance: He is well-developed.  HENT:     Head: Normocephalic and atraumatic.     Right Ear: External ear normal.     Left Ear: External ear normal.  Eyes:     General: No scleral icterus.       Right eye: No discharge.        Left eye: No discharge.     Conjunctiva/sclera: Conjunctivae  normal.  Neck:     Trachea: No tracheal deviation.  Cardiovascular:     Rate and Rhythm: Normal rate and regular rhythm.  Pulmonary:     Effort: Pulmonary effort is normal. No respiratory distress.     Breath sounds: Normal breath sounds. No stridor. No wheezing or rales.  Abdominal:     General: Bowel sounds are normal. There is no distension.     Palpations: Abdomen is soft.     Tenderness: There is no abdominal tenderness. There is  no guarding or rebound.  Musculoskeletal:        General: No tenderness.     Cervical back: Neck supple.  Skin:    General: Skin is warm and dry.     Findings: No rash.  Neurological:     Mental Status: He is alert and oriented to person, place, and time.     Cranial Nerves: No cranial nerve deficit (No facial droop, extraocular movements intact, tongue midline ).     Sensory: No sensory deficit.     Motor: No abnormal muscle tone or seizure activity.     Coordination: Coordination normal.     Comments: No pronator drift bilateral upper extrem, able to hold both legs off bed for 5 seconds, sensation intact in all extremities, no visual field cuts, no left or right sided neglect, normal finger-nose exam bilaterally, no nystagmus noted      ED Results / Procedures / Treatments   Labs (all labs ordered are listed, but only abnormal results are displayed) Labs Reviewed  BASIC METABOLIC PANEL - Abnormal; Notable for the following components:      Result Value   Glucose, Bld 103 (*)    BUN 23 (*)    Creatinine, Ser 1.35 (*)    Calcium 8.7 (*)    GFR calc non Af Amer 59 (*)    All other components within normal limits  CBC - Abnormal; Notable for the following components:   RBC 2.89 (*)    Hemoglobin 8.4 (*)    HCT 26.4 (*)    All other components within normal limits  HEPATIC FUNCTION PANEL - Abnormal; Notable for the following components:   Total Protein 6.4 (*)    All other components within normal limits  CBG MONITORING, ED - Abnormal;  Notable for the following components:   Glucose-Capillary 154 (*)    All other components within normal limits  LIPASE, BLOOD    EKG EKG Interpretation  Date/Time:  Monday May 10 2020 15:33:03 EDT Ventricular Rate:  75 PR Interval:    QRS Duration: 81 QT Interval:  401 QTC Calculation: 448 R Axis:   47 Text Interpretation: Sinus rhythm Since last tracing rate slower Confirmed by Dorie Rank [phone]) on 05/10/2020 6:23:18 PM   Radiology No results found.  Procedures Procedures (including critical care time)  Medications Ordered in ED Medications  sodium chloride 0.9 % bolus 1,000 mL ( Intravenous Stopped 05/10/20 1712)    ED Course  I have reviewed the triage vital signs and the nursing notes.  Pertinent labs & imaging results that were available during my care of the patient were reviewed by me and considered in my medical decision making (see chart for details).  Clinical Course as of May 10 1824  Mon May 10, 2020  1524 SImilar to previous  CBC(!) [JK]  1524 Similar to baseline  Basic metabolic panel(!) [JK]  6063 Labs reviewed.  Lipase and hepatic function panel normal   [JK]  1626 Orthostatic vital signs noted with initial blood pressure.  Will recheck after IV fluids   [JK]  1821 Patient is feeling better.  Eating and drinking without difficulty   [JK]    Clinical Course User Index [JK] Dorie Rank, MD   MDM Rules/Calculators/A&P                          Patient presents to the ED for evaluation of dizziness and lightheadedness.  Patient's neurologic exam was normal.  No findings to suggest stroke or TIA.  Patient was noted to have low blood pressure initially and was orthostatic and I suspect that was the etiology of his dizziness.  His labs otherwise are stable.  He is anemic but that is chronic.  No signs to suggest acute GI bleeding.  Electrolyte panel does not show severe dehydration.  Laboratory tests are otherwise reassuring.  Patient was given IV  fluids.  His blood pressure is normalized.  Patient is able to eat during stand without difficulty.  Peers stable for discharge at this time. Final Clinical Impression(s) / ED Diagnoses Final diagnoses:  Orthostatic hypotension    Rx / DC Orders ED Discharge Orders    None       Dorie Rank, MD 05/10/20 1825

## 2020-05-10 NOTE — ED Triage Notes (Signed)
Pt BIB EMS and presents dizziness x 30 minutes. Pt reports that the dizziness began after a bowel movement.  Pt reports that he didn't strain any more than usual. No blood in BM. On EMS arrival, pt was hypotensive with a BP of 100/P. EMS started an IV and gave pt about of NaCl.  BP improved to 124/68. Hx DM but EMS's CBG was 124. Pt a/o x 4.

## 2020-05-12 ENCOUNTER — Telehealth: Payer: Self-pay | Admitting: Critical Care Medicine

## 2020-05-12 NOTE — Telephone Encounter (Signed)
Returned patient call and left a message to return call.   Copied from CRM [id number]. Topic: Referral - Status >> May 12, 2020  1:01 PM Marylen Ponto wrote: Reason for CRM: Pt stated he needs to know the name of doctor that he was referred to so he can all them to schedule an appt. Pt requests call back

## 2020-05-13 NOTE — Telephone Encounter (Signed)
Patient called to inform Dr [name] that he missed an appointment on 05/10/20 and need to be rescheduled asking for a call back to help him reschedule this visit. Can be reached at Ph# [phone]

## 2020-05-13 NOTE — Telephone Encounter (Signed)
Patient is calling to state that he missed an appt with a doctor off of [place]. And patient is needing assistance to rescedule the appt please advise [phone] (H)

## 2020-05-14 NOTE — Telephone Encounter (Signed)
Good Afternoon  I don't see any referral in place but I see that he is schedule [facility] that is located in [place]  for 10/2020. Unable to talk patient

## 2020-05-19 ENCOUNTER — Encounter: Payer: Self-pay | Admitting: Critical Care Medicine

## 2020-05-19 ENCOUNTER — Other Ambulatory Visit: Payer: Self-pay | Admitting: Critical Care Medicine

## 2020-05-19 ENCOUNTER — Other Ambulatory Visit (HOSPITAL_COMMUNITY): Payer: Self-pay | Admitting: Critical Care Medicine

## 2020-05-19 DIAGNOSIS — E0865 Diabetes mellitus due to underlying condition with hyperglycemia: Secondary | ICD-10-CM

## 2020-05-19 DIAGNOSIS — E785 Hyperlipidemia, unspecified: Secondary | ICD-10-CM

## 2020-05-19 DIAGNOSIS — K219 Gastro-esophageal reflux disease without esophagitis: Secondary | ICD-10-CM

## 2020-05-19 DIAGNOSIS — IMO0002 Reserved for concepts with insufficient information to code with codable children: Secondary | ICD-10-CM

## 2020-05-19 DIAGNOSIS — I10 Essential (primary) hypertension: Secondary | ICD-10-CM

## 2020-05-19 MED ORDER — ATORVASTATIN CALCIUM 80 MG PO TABS: 80.0000 mg | ORAL_TABLET | Freq: Every day | ORAL | 3 refills | Status: DC

## 2020-05-19 MED ORDER — LEVEMIR FLEXTOUCH 100 UNIT/ML ~~LOC~~ SOPN: PEN_INJECTOR | SUBCUTANEOUS | 1 refills | Status: DC

## 2020-05-19 MED ORDER — LEVEMIR FLEXTOUCH 100 UNIT/ML ~~LOC~~ SOPN: 25.0000 [IU] | PEN_INJECTOR | Freq: Two times a day (BID) | SUBCUTANEOUS | 1 refills | Status: DC

## 2020-05-19 MED ORDER — PANTOPRAZOLE SODIUM 40 MG PO TBEC: 40.0000 mg | DELAYED_RELEASE_TABLET | Freq: Every day | ORAL | 3 refills | Status: DC

## 2020-05-19 MED ORDER — INSULIN ASPART 100 UNIT/ML FLEXPEN
5.0000 [IU] | PEN_INJECTOR | Freq: Two times a day (BID) | SUBCUTANEOUS | 1 refills | Status: DC
Start: 2020-05-19 — End: 2020-05-26

## 2020-05-19 MED ORDER — LOSARTAN POTASSIUM 100 MG PO TABS: 100.0000 mg | ORAL_TABLET | Freq: Every day | ORAL | 3 refills | Status: DC

## 2020-05-19 MED FILL — LOSARTAN POTASSIUM 100 MG T: 100 | 30 days supply | Qty: 30 | Fill #0

## 2020-05-19 MED FILL — PANTOPRAZOLE SOD DR 40 MG T: 40 | 30 days supply | Qty: 30 | Fill #0

## 2020-05-19 MED FILL — ATORVASTATIN 80 MG TABLET: 80 | 30 days supply | Qty: 30 | Fill #0

## 2020-05-19 NOTE — Progress Notes (Signed)
Patient ID: Rodney Barber, male   DOB: Aug 04, 1966, 54 y.o.   MRN: [id number] This a 54 year old male who [facility] history of severe diabetes chronic pancreatitis chronic systolic heart failure hypertension coronary artery disease  Patient was in the ER recently because of severe weakness and syncope.  He was found to be hypotensive and responded to a liter of saline.  Note his hemoglobin was documented to be down to 8.4 was previously 9.3 a month prior  He has had now no nausea or vomiting or headache he is eating better he is less dizzy at this time.  He said no more syncopal episodes.  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

## 2020-05-20 ENCOUNTER — Telehealth: Payer: Self-pay

## 2020-05-20 NOTE — Telephone Encounter (Signed)
-----   Message from [name], MD sent at 05/19/2020  3:32 PM EDT ----- Can you reschedule this pts vascular study, he missed appt as was admitted to hospital.  His cell is correct in chart  Also he needs a post hosp OV with me by end of month

## 2020-05-20 NOTE — Telephone Encounter (Signed)
ATC pt x 2 for rescheduling of vascular appt and to schedule hosp f/u w/ PCP by end of the month per PCP instructions, LM to RC on both phone #s on file.

## 2020-05-21 NOTE — Telephone Encounter (Signed)
ATC pt x 2 for appt scheduling, no answer, LM to RC x 2.

## 2020-05-21 NOTE — Telephone Encounter (Signed)
Spoke w/ pt, scheduled for 10/26 w/ Dr. [name] and 11/2 @ 1p, arrive @ 12:45p w/ vascular, pt aware of both appts.

## 2020-05-21 NOTE — Telephone Encounter (Signed)
Pt returning call. Attempted to contact office x3. Please advise.  

## 2020-05-26 ENCOUNTER — Encounter: Payer: Self-pay | Admitting: Critical Care Medicine

## 2020-05-26 ENCOUNTER — Other Ambulatory Visit: Payer: Self-pay | Admitting: Critical Care Medicine

## 2020-05-26 MED ORDER — INSULIN ASPART 100 UNIT/ML FLEXPEN
5.0000 [IU] | PEN_INJECTOR | Freq: Two times a day (BID) | SUBCUTANEOUS | 1 refills | Status: DC
Start: 2020-05-26 — End: 2020-06-01

## 2020-05-26 NOTE — Progress Notes (Signed)
Patient ID: Rodney Barber, male   DOB: 10/25/1965, 54 y.o.   MRN: [id number] This is a 54 year old male who has history of severe diabetes and is in need of refills on the NovoLog a prescription was sent to the [facility] outpatient pharmacy for the NovoLog I also reorganized his pill container

## 2020-05-27 MED FILL — NOVOLOG FLEXPEN SYRINGE: 100 | 30 days supply | Qty: 3 | Fill #0

## 2020-06-01 ENCOUNTER — Other Ambulatory Visit: Payer: Self-pay

## 2020-06-01 ENCOUNTER — Encounter: Payer: Self-pay | Admitting: Critical Care Medicine

## 2020-06-01 ENCOUNTER — Ambulatory Visit: Payer: Self-pay | Attending: Critical Care Medicine | Admitting: Critical Care Medicine

## 2020-06-01 VITALS — BP 73/45 | HR 65 | Ht 67.8 in | Wt 137.2 lb

## 2020-06-01 DIAGNOSIS — E1165 Type 2 diabetes mellitus with hyperglycemia: Secondary | ICD-10-CM

## 2020-06-01 DIAGNOSIS — E162 Hypoglycemia, unspecified: Secondary | ICD-10-CM

## 2020-06-01 DIAGNOSIS — IMO0002 Reserved for concepts with insufficient information to code with codable children: Secondary | ICD-10-CM

## 2020-06-01 DIAGNOSIS — E785 Hyperlipidemia, unspecified: Secondary | ICD-10-CM

## 2020-06-01 DIAGNOSIS — I9589 Other hypotension: Secondary | ICD-10-CM

## 2020-06-01 DIAGNOSIS — K8689 Other specified diseases of pancreas: Secondary | ICD-10-CM

## 2020-06-01 DIAGNOSIS — I5043 Acute on chronic combined systolic (congestive) and diastolic (congestive) heart failure: Secondary | ICD-10-CM

## 2020-06-01 DIAGNOSIS — I10 Essential (primary) hypertension: Secondary | ICD-10-CM

## 2020-06-01 DIAGNOSIS — E0865 Diabetes mellitus due to underlying condition with hyperglycemia: Secondary | ICD-10-CM

## 2020-06-01 DIAGNOSIS — E861 Hypovolemia: Secondary | ICD-10-CM

## 2020-06-01 DIAGNOSIS — E1169 Type 2 diabetes mellitus with other specified complication: Secondary | ICD-10-CM

## 2020-06-01 LAB — GLUCOSE, POCT (MANUAL RESULT ENTRY)
POC Glucose: 104 mg/dl — AB (ref 70–99)
POC Glucose: 58 mg/dl — AB (ref 70–99)
POC Glucose: 187 mg/dl — AB (ref 70–99)
POC Glucose: 55 mg/dl — AB (ref 70–99)

## 2020-06-01 MED ORDER — INSULIN ASPART 100 UNIT/ML FLEXPEN
3.0000 [IU] | PEN_INJECTOR | Freq: Two times a day (BID) | SUBCUTANEOUS | 1 refills | Status: DC
Start: 2020-06-01 — End: 2020-06-02

## 2020-06-01 MED ORDER — LEVEMIR FLEXTOUCH 100 UNIT/ML ~~LOC~~ SOPN: PEN_INJECTOR | SUBCUTANEOUS | 1 refills | Status: DC

## 2020-06-01 MED ORDER — GLUCOSE-VITAMIN C 4-6 GM-MG PO CHEW
5.0000 | CHEWABLE_TABLET | Freq: Once | ORAL | Status: AC
  Administered 2020-06-01: 5 via ORAL

## 2020-06-01 MED ORDER — SODIUM CHLORIDE 0.9 % IV BOLUS
1000.0000 mL | Freq: Once | INTRAVENOUS | Status: AC
  Administered 2020-06-01: 1000 mL via INTRAVENOUS

## 2020-06-01 MED ORDER — INSTA-GLUCOSE 77.4 % PO GEL: 1.0000 | Freq: Once | ORAL | Status: DC

## 2020-06-01 MED ORDER — TORSEMIDE 20 MG PO TABS: 20.0000 mg | ORAL_TABLET | Freq: Every day | ORAL | 0 refills | Status: DC

## 2020-06-01 NOTE — Assessment & Plan Note (Signed)
Hyperglycemia due to lack of substrate but continuing higher doses of insulin  We will reduce NovoLog to 3 units at breakfast and lunch reduce Levemir to 25 units morning 20 units in the evening

## 2020-06-01 NOTE — Assessment & Plan Note (Signed)
Continue atorvastatin

## 2020-06-01 NOTE — Assessment & Plan Note (Signed)
1 L of saline given with improvement in blood pressure at the end of the infusions

## 2020-06-01 NOTE — Patient Instructions (Addendum)
Reduce NovoLog to 3 units at breakfast and lunch daily  Reduce Levemir to 25 units in the morning and 20 units in the evening   Reduce torsemide to 1 tablet daily only if needed for swelling  Labs are obtained today  Dr. [name] will follow you up in the [facility] on Wednesday of this week

## 2020-06-01 NOTE — Assessment & Plan Note (Signed)
Hypotension at this visit we will hold torsemide for now

## 2020-06-01 NOTE — Progress Notes (Signed)
Subjective:    Patient ID: Rodney Barber, male    DOB: Jun 07, 1966, 54 y.o.   MRN: [id number]  12/09/19 This is a 54 year old male who seen previously in the [facility] comes in today with recent hospitalization for hypoglycemia we was on 35 units of Levemir with not eating regularly.  Note he has not been using his pancreatic enzyme medicine regularly.  Also he is missed doses of lisinopril and amlodipine.  On exam blood pressure 186/94 glucose is greater than 400  Patient is thin and in no acute distress chest was clear abdomen was slightly tender extremities showed no edema  Impression is that of type 2 diabetes poorly controlled secondary to poor dietary habits  Also hypertension poorly controlled at this time  12/10/2019 This patient is seen for the first time in the [facility].  He previously been followed intermittently at the [facility].  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  He is also had very poor dietary habits and skips meals at times.  He had an episode of severe hypoglycemia on 30 units of Levemir daily and was admitted between the ninth and 10 April.  Below is the discharge summary.  Admitted early April hypoglycemia   Admit date: 11/14/2019 Discharge date: 11/15/2019  Admitted From: Shelter Disposition: Shelter  Recommendations for Outpatient Follow-up:  1. Follow up with PCP in 1-2 weeks 2. Please obtain BMP/CBC in one week  Discharge Condition: Stable CODE STATUS: Full Diet recommendation: Diabetic diet as tolerated  Brief/Interim Summary: Rodney Barber a 54 y.o.malewith medical history significant oftype 2 diabetes/IDDM, hypertension, pancreatitis, tobacco use, and homelessness who presents with hypoglycemia. Patient is alert and oriented at present but unable to give great recollection of this morning. He  states he recalls speaking with EMS and understands he is now at [facility] because of the low blood sugar. He states he was in usual state of health through last night and took his insulin in the evening as he normally does. However he cannot tell me what dose of insulin he takes. However he does state that he does not normally get a low blood sugar and has not had issues like this throughout the week. He denies any fevers or chills. He denies any new rashes, urinary symptoms, nausea vomiting or diarrhea. He reports he is otherwise taking his meds without assistance. He denies any other symptoms, no cough/shortness of breath, chest pain. Per EMS report, patient was altered and noted to be hypoglycemic to 55. He received D10, 250 cc in route and in the emergency room was again noted to be hypoglycemic to 30s. Patient reports using tobacco, approximately half pack per day. No alcohol, no drugs. He reports ambulating with the assistance of a walker, denies any dizziness lightheadedness or falls.  Patient admitted as above with acute symptomatic hypoglycemia in the setting of medication and dietary noncompliance.  Patient is more awake alert oriented this morning, we discussed his insulin regimen at length.  Patient declines any urinary symptoms, antibiotics will be discontinued appropriately.  It appears he has not been using his blood glucose monitor as he has run out of test strips and lancets, because of this he has been dosing his insulin regularly likely the reason for his A1c being quite elevated but being admitted for acute symptomatic hypoglycemia.  Given concern for ongoing hypoglycemia patient's Lantus has been decreased to 20 units nightly,  discussed that he needs to maintain his diabetic diet and take this medication every night, follow his Accu-Cheks as scheduled and follow-up with his PCP in the next 3 to 4 days.  He indicates his last office visit was supposed to be Wednesday but due to  transportation issues not being provided he was unable to point to their office.  Discussed that should he continue to be noncompliant or use medications inappropriately he had a markedly increased risk of death given his profound hypoglycemia.  Patient understood the risks of not continued medical compliance follow-up and medication administrations.  Patient is otherwise stable and agreeable for discharge home, ambulating without difficulty, tolerating p.o. quite well and glucose remains somewhat well controlled although minimally elevated in the setting of dextrose and hypoglycemic bolusing overnight.   Discharge Diagnoses:  Principal Problem:   Hypoglycemia Active Problems:   Severe malnutrition (HCC)   Protein-calorie malnutrition, severe (HCC)   Altered mental status   AMS (altered mental status)   Noncompliance with medications  Patient comes in today and still is running high blood sugars today his CBG is 340.  He has no longer drinking.  He is not eating healthy foods.  He is trying to eat some protein vegetables and occasionally bananas.  He also has severe dental issues with significant dental caries and periodontal disease.   04/20/2020 Since last OV in early May in hospital again later in may:  This patient was seen in follow-up last early made and subsequent to that was admitted for acute heart failure with coronary artery disease newly diagnosed.  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  Patient was seen by cardiology recently who did not make any medication changes  Below is the discharge summary from May Admit date: 12/28/2019 Discharge date: 01/06/2020  Time spent: 35 minutes  Recommendations for Outpatient Follow-up:  [facility], office to arrange follow-up [facility] on 6/15 at 11 AM Please check BMP in 1 week  Discharge Diagnoses:   Acute hypoxic respiratory failure Acute systolic and diastolic CHF, EF of 42% Coronary artery disease Chronic alcoholic pancreatitis EtOH abuse Transudative pleural effusions Type 2 diabetes mellitus on insulin Homelessness   Hyperglycemia   Tobacco abuse   Acute respiratory failure with hypoxia (HCC)   S/P thoracentesis   Acute on chronic combined systolic and diastolic CHF (congestive heart failure) ([place])   Essential hypertension   Abnormal nuclear stress test   Elevated brain natriuretic peptide (BNP) level   Discharge Condition: Improved  Diet recommendation: Low-sodium, diabetic, heart healthy  Filed Weights  01/04/20 0014 01/05/20 0417 01/06/20 0423 Weight: 54.3 kg 53.3 kg 52.4 kg   History of present illness:  54 yo homeless male with history of alcoholic pancreatitis, htn, DM2 poorly controlled, presented to [facility] 5/22 with CC SOB, cough. In ED, pt hypoxic, SpO2 70s on RA. Initially, placed on NRB , progressive resp distress and hypoxia was intubated in the emergency room.  Hospital Course:   Acute hypoxemic respiratory failure -Acute systolic CHF, EF of 70% lower from prior -Admitted to the ICU with acute hypoxic respiratory failure requiring mechanical intubation, diuresed with IV Lasix, extubated 5/25 -Status post bilateral thoracentesis 5/25, fluid was transudative -Followed by cardiology this admission -Clinically improved with diuresis, work-up noted echo with drop in EF down to 35%, subsequently underwent Lexiscan Myoview which was abnormal -Then had left heart catheterization-noted 70% RCA  disease and otherwise nonobstructive CAD, medical management was recommended -Clinically improved and stable now, transition to oral torsemide and Aldactone, also started on carvedilol, losartan -Discharged back to shelter today -Follow-up arranged with  wellness clinic and [facility] to arrange follow-up as well, needs BMP in 1 week, importance of  compliance with medications, salt restriction, diabetic diet and follow-up emphasized  Chronic acoholic pancreatitis  -Resume Creon  COPD/heavy tobacco abuse -Smoking 3 packs/day, counseled -Nebs PRN  DM2brittle -Brittle diabetic, with significant fluctuations in blood sugars noted -Subsequently transitioned to Levemir 15 units in the morning and 12 at bedtime  Homelessness -Followed by social work inpatient, patient is uninsured without family support, he will be discharged back to the shelter at [place]  Chronic anemia -Stable  H/o HTN -BP stable, monitor   Pt followed closely since that time in [place] clinic  Wt Readings from Last 3 Encounters: 04/20/20 : 127 lb 9.6 oz (57.9 kg) 04/19/20 : 128 lb (58.1 kg) 01/14/20 : 114 lb (51.7 kg)  On arrival blood sugar is over 200 A1c is 14.9  06/01/2020 This patient on arrival has a blood pressure of 70 and glucose of 104 and later drops to 58.  The patient is quite dehydrated.  He did not eat much for breakfast but took his 5 units of NovoLog.  Patient denies any other GI complaints at this time.  There is no nausea or vomiting.    Past Medical History:  Diagnosis Date  . Diabetes mellitus   . Hypertension   . Pancreatitis      Family History  Problem Relation Age of Onset  . Diabetes Mother   . Diabetes Brother      Social History   Socioeconomic History  . Marital status: Single    Spouse name: Not on file  . Number of children: Not on file  . Years of education: Not on file  . Highest education level: Not on file  Occupational History  . Not on file  Tobacco Use  . Smoking status: Current Every Day Smoker    Packs/day: 0.50    Years: 30.00    Pack years: 15.00    Types: Cigarettes  . Smokeless tobacco: Never Used  . Tobacco comment: 6 cigarett /day  Vaping Use  . Vaping Use: Never used  Substance and Sexual Activity  . Alcohol use: No  . Drug use: Not Currently    Types:  "Crack" cocaine, Other-see comments, Cocaine    Comment: last smoked crack 12-16-16  . Sexual activity: Never  Other Topics Concern  . Not on file  Social History Narrative  . Not on file   Social Determinants of Health   Financial Resource Strain:   . Difficulty of Paying Living Expenses: Not on file  Food Insecurity:   . Worried About Charity fundraiser in the Last Year: Not on file  . Ran Out of Food in the Last Year: Not on file  Transportation Needs:   . Lack of Transportation (Medical): Not on file  . Lack of Transportation (Non-Medical): Not on file  Physical Activity:   . Days of Exercise per Week: Not on file  . Minutes of Exercise per Session: Not on file  Stress:   . Feeling of Stress : Not on file  Social Connections:   . Frequency of Communication with Friends and Family: Not on file  . Frequency of Social Gatherings with Friends and Family: Not on file  .  Attends Religious Services: Not on file  . Active Member of Clubs or Organizations: Not on file  . Attends Archivist Meetings: Not on file  . Marital Status: Not on file  Intimate Partner Violence:   . Fear of Current or Ex-Partner: Not on file  . Emotionally Abused: Not on file  . Physically Abused: Not on file  . Sexually Abused: Not on file     No Known Allergies   Outpatient Medications Prior to Visit  Medication Sig Dispense Refill  . aspirin 81 MG chewable tablet Chew 1 tablet (81 mg total) by mouth daily. 120 tablet 0  . atorvastatin (LIPITOR) 80 MG tablet Take 1 tablet (80 mg total) by mouth daily at 6 PM. 30 tablet 3  . blood glucose meter kit and supplies KIT Dispense based on patient and insurance preference. Use up to four times daily as directed. (FOR ICD-9 250.00, 250.01). 1 each 0  . carvedilol (COREG) 25 MG tablet Take 1 tablet (25 mg total) by mouth 2 (two) times daily with a meal. 60 tablet 0  . Ferrous Sulfate Dried (FEOSOL) 200 (65 Fe) MG TABS Take 1 tablet by mouth 2 (two)  times daily with a meal. 60 tablet 3  . Glucose 4-6 GM-MG CHEW Chew one as needed for blood glucose < 70 (Patient taking differently: Chew 1 tablet by mouth 2 (two) times daily as needed (low glucose). Chew one as needed for blood glucose < 70) 100 tablet 0  . Insulin Pen Needle 29G X 5MM MISC Use with insulin pens 100 each 1  . losartan (COZAAR) 100 MG tablet Take 1 tablet (100 mg total) by mouth daily. 30 tablet 3  . pantoprazole (PROTONIX) 40 MG tablet Take 1 tablet (40 mg total) by mouth daily. 30 tablet 3  . spironolactone (ALDACTONE) 25 MG tablet Take 1 tablet (25 mg total) by mouth daily. 30 tablet 2  . insulin aspart (NOVOLOG) 100 UNIT/ML FlexPen Inject 5 Units into the skin 2 (two) times daily with a meal. 6 mL 1  . insulin detemir (LEVEMIR FLEXTOUCH) 100 UNIT/ML FlexPen 30 units AM and 25 units PM 50 mL 1  . torsemide (DEMADEX) 20 MG tablet Take 1 tablet (20 mg total) by mouth daily. 90 tablet 0  . feeding supplement, GLUCERNA SHAKE, (GLUCERNA SHAKE) LIQD Take 237 mLs by mouth 3 (three) times daily between meals. (Patient not taking: Reported on 06/01/2020)  0   No facility-administered medications prior to visit.      Review of Systems  Constitutional: Positive for activity change, fatigue and unexpected weight change. Negative for appetite change.  HENT: Positive for dental problem. Negative for sinus pressure and sinus pain.   Respiratory: Negative.   Cardiovascular: Negative for chest pain, palpitations and leg swelling.  Gastrointestinal: Positive for constipation. Negative for diarrhea, nausea, rectal pain and vomiting.  Endocrine: Negative for polydipsia and polyuria.  Genitourinary: Negative for enuresis and frequency.  Musculoskeletal: Negative.   Neurological: Positive for weakness. Negative for light-headedness.  Hematological: Negative.   Psychiatric/Behavioral: Negative.        Objective:   Physical Exam Vitals:   06/01/20 0948  BP: (!) 73/45  Pulse: 65   SpO2: 100%  Weight: 137 lb 3.2 oz (62.2 kg)  Height: 5' 7.8" (1.722 m)   Repeat blood pressures are improved after 1 L of saline given Gen: Pleasant, cachectic, in no distress,  normal affect  ENT: No lesions,  mouth clear,  oropharynx clear, no postnasal  drip  Neck: No JVD, no TMG, no carotid bruits  Lungs: No use of accessory muscles, no dullness to percussion, clear without rales or rhonchi  Cardiovascular: RRR, heart sounds normal, no murmur or gallops, no peripheral edema  Abdomen: soft and NT, no HSM,  BS normal  Musculoskeletal: No deformities, no cyanosis or clubbing  Neuro: alert, non focal  Skin: Warm, no lesions or rashes  CBG 104 then later dropped to 58 back up to 187 after glucose administered      Assessment & Plan:  I personally reviewed all images and lab data in the [facility] system as well as any outside material available during this office visit and agree with the  radiology impressions.   Hypoglycemia Hyperglycemia due to lack of substrate but continuing higher doses of insulin  We will reduce NovoLog to 3 units at breakfast and lunch reduce Levemir to 25 units morning 20 units in the evening  Hypotension due to hypovolemia 1 L of saline given with improvement in blood pressure at the end of the infusions  Essential hypertension Hypotension at this visit we will hold torsemide for now  Hyperlipidemia associated with type 2 diabetes mellitus ([place]) Continue atorvastatin   Rodney Barber was seen today for follow-up.  Diagnoses and all orders for this visit:  Hypotension due to hypovolemia -     sodium chloride 0.9 % bolus 1,000 mL  Uncontrolled type 2 diabetes mellitus with hyperglycemia (HCC) -     POCT glucose (manual entry) -     CBC with Differential/Platelet  Hypoglycemia -     Insta-Glucose 77.4 % GEL 1 Tube -     glucose-Vitamin C 4-0.006 GM per chewable tablet 5 tablet -     Comprehensive metabolic panel -     POCT glucose (manual entry) -      POCT glucose (manual entry) -     POCT glucose (manual entry)  Uncontrolled diabetes mellitus secondary to pancreatic insufficiency (HCC) -     insulin detemir (LEVEMIR FLEXTOUCH) 100 UNIT/ML FlexPen; 25 units AM and 20 units PM -     Comprehensive metabolic panel -     Hemoglobin A1c  Acute on chronic combined systolic and diastolic CHF (congestive heart failure) (HCC) -     torsemide (DEMADEX) 20 MG tablet; Take 1 tablet (20 mg total) by mouth daily. As needed for swelling in legs  Essential hypertension  Hyperlipidemia associated with type 2 diabetes mellitus ([place])  Other orders -     insulin aspart (NOVOLOG) 100 UNIT/ML FlexPen; Inject 3 Units into the skin 2 (two) times daily with a meal.  The patient was kept for 1 hour for the infusion of 1 L saline in the administration of oral glucose

## 2020-06-02 ENCOUNTER — Other Ambulatory Visit (HOSPITAL_COMMUNITY): Payer: Self-pay | Admitting: Critical Care Medicine

## 2020-06-02 ENCOUNTER — Other Ambulatory Visit: Payer: Self-pay | Admitting: Critical Care Medicine

## 2020-06-02 ENCOUNTER — Encounter: Payer: Self-pay | Admitting: Critical Care Medicine

## 2020-06-02 DIAGNOSIS — I5043 Acute on chronic combined systolic (congestive) and diastolic (congestive) heart failure: Secondary | ICD-10-CM

## 2020-06-02 DIAGNOSIS — IMO0002 Reserved for concepts with insufficient information to code with codable children: Secondary | ICD-10-CM

## 2020-06-02 DIAGNOSIS — E0865 Diabetes mellitus due to underlying condition with hyperglycemia: Secondary | ICD-10-CM

## 2020-06-02 LAB — CBC WITH DIFFERENTIAL/PLATELET
Basophils Absolute: 0.1 10*3/uL (ref 0.0–0.2)
Basos: 1 %
EOS (ABSOLUTE): 0.5 10*3/uL — ABNORMAL HIGH (ref 0.0–0.4)
Eos: 6 %
Hematocrit: 26.9 % — ABNORMAL LOW (ref 37.5–51.0)
Hemoglobin: 9.1 g/dL — ABNORMAL LOW (ref 13.0–17.7)
Immature Grans (Abs): 0 10*3/uL (ref 0.0–0.1)
Immature Granulocytes: 0 %
Lymphocytes Absolute: 1.6 10*3/uL (ref 0.7–3.1)
Lymphs: 20 %
MCH: 30.4 pg (ref 26.6–33.0)
MCHC: 33.8 g/dL (ref 31.5–35.7)
MCV: 90 fL (ref 79–97)
Monocytes Absolute: 0.6 10*3/uL (ref 0.1–0.9)
Monocytes: 7 %
Neutrophils Absolute: 5.3 10*3/uL (ref 1.4–7.0)
Neutrophils: 66 %
Platelets: 215 10*3/uL (ref 150–450)
RBC: 2.99 x10E6/uL — ABNORMAL LOW (ref 4.14–5.80)
RDW: 13.2 % (ref 11.6–15.4)
WBC: 8 10*3/uL (ref 3.4–10.8)

## 2020-06-02 LAB — COMPREHENSIVE METABOLIC PANEL
ALT: 21 IU/L (ref 0–44)
AST: 22 IU/L (ref 0–40)
Albumin/Globulin Ratio: 1.6 (ref 1.2–2.2)
Albumin: 4 g/dL (ref 3.8–4.9)
Alkaline Phosphatase: 84 IU/L (ref 44–121)
BUN/Creatinine Ratio: 22 — ABNORMAL HIGH (ref 9–20)
BUN: 27 mg/dL — ABNORMAL HIGH (ref 6–24)
Bilirubin Total: 0.2 mg/dL (ref 0.0–1.2)
CO2: 21 mmol/L (ref 20–29)
Calcium: 8.7 mg/dL (ref 8.7–10.2)
Chloride: 105 mmol/L (ref 96–106)
Creatinine, Ser: 1.22 mg/dL (ref 0.76–1.27)
GFR calc Af Amer: 77 mL/min/{1.73_m2} (ref >59)
GFR calc non Af Amer: 66 mL/min/{1.73_m2} (ref >59)
Globulin, Total: 2.5 g/dL (ref 1.5–4.5)
Glucose: 163 mg/dL — ABNORMAL HIGH (ref 65–99)
Potassium: 3.9 mmol/L (ref 3.5–5.2)
Sodium: 139 mmol/L (ref 134–144)
Total Protein: 6.5 g/dL (ref 6.0–8.5)

## 2020-06-02 LAB — HEMOGLOBIN A1C
Est. average glucose Bld gHb Est-mCnc: 306 mg/dL
Hgb A1c MFr Bld: 12.3 % — ABNORMAL HIGH (ref 4.8–5.6)

## 2020-06-02 MED ORDER — INSULIN ASPART 100 UNIT/ML FLEXPEN
3.0000 [IU] | PEN_INJECTOR | Freq: Two times a day (BID) | SUBCUTANEOUS | 1 refills | Status: DC
Start: 2020-06-02 — End: 2020-06-23

## 2020-06-02 MED ORDER — LEVEMIR FLEXTOUCH 100 UNIT/ML ~~LOC~~ SOPN: PEN_INJECTOR | SUBCUTANEOUS | 1 refills | Status: DC

## 2020-06-02 MED ORDER — INSULIN PEN NEEDLE 29G X 5MM MISC
1 refills | Status: DC
Start: 2020-06-02 — End: 2020-06-23

## 2020-06-02 MED ORDER — TORSEMIDE 20 MG PO TABS: 20.0000 mg | ORAL_TABLET | Freq: Every day | ORAL | 0 refills | Status: DC

## 2020-06-02 MED FILL — TORSEMIDE 20 MG TABLET: 20 | 30 days supply | Qty: 30 | Fill #0

## 2020-06-02 MED FILL — UNIFINE PENTIPS 31GX3/16: 31G X 5 MM | 30 days supply | Qty: 100 | Fill #0

## 2020-06-02 MED FILL — LEVEMIR FLEXTOUCH 100 UNITS: 100 | 30 days supply | Qty: 15 | Fill #0

## 2020-06-03 MED FILL — !NOVOLOG FLEXPEN SYRINGE 1: 100/ML | 30 days supply | Qty: 3 | Fill #0

## 2020-06-03 NOTE — Progress Notes (Signed)
Patient ID: Keyton Bhat, male   DOB: April 13, 1966, 54 y.o.   MRN: [id number] This patient is seen in the [place] shelter clinic 54 year old male with significant type 2 diabetes secondary to pancreatitis acting more like a type I and history of cardiovascular disease with coronary artery disease and systolic heart failure  Patient was seen in the clinic yesterday with hypotension hypoglycemia we treated the patient with 1 L of saline we had to give him glucose supplementation got his sugar up from 55 up to 187 and blood pressure improved with saline hydration lab data returned showing stabilize renal function no electrolyte imbalances no evidence of acidosis hemoglobin is up to 9.2  On exam blood pressure 120/60 saturation 99% room air pulse 71 blood sugar was 290  Impression is that of type I/II diabetes secondary to chronic pancreatitis now with pancreatic insufficiency due to alcohol use no longer drinking alcohol #2 cardiovascular disease with coronary artery disease and systolic heart failure stable  Plan will be to reduce the torsemide to every other day and to increase Levemir to 25 units twice daily and continue NovoLog 3 units twice daily with meals breakfast and lunch we will get him refills on his insulin pen needles and the torsemide I reloaded his pill organizer's

## 2020-06-07 NOTE — Telephone Encounter (Signed)
Called pt. Spoke with pt's brother. Confirmed following day's appt at Imaging Dept.

## 2020-06-08 ENCOUNTER — Other Ambulatory Visit: Payer: Self-pay

## 2020-06-08 ENCOUNTER — Ambulatory Visit (HOSPITAL_COMMUNITY)
Admission: RE | Admit: 2020-06-08 | Discharge: 2020-06-08 | Disposition: A | Discharge status: Home / Self Care | Payer: Self-pay | Source: Ambulatory Visit | Attending: Cardiovascular Disease | Admitting: Cardiovascular Disease

## 2020-06-08 DIAGNOSIS — I10 Essential (primary) hypertension: Secondary | ICD-10-CM | POA: Insufficient documentation

## 2020-06-08 DIAGNOSIS — E119 Type 2 diabetes mellitus without complications: Secondary | ICD-10-CM | POA: Insufficient documentation

## 2020-06-08 DIAGNOSIS — E785 Hyperlipidemia, unspecified: Secondary | ICD-10-CM | POA: Insufficient documentation

## 2020-06-08 DIAGNOSIS — I2510 Atherosclerotic heart disease of native coronary artery without angina pectoris: Secondary | ICD-10-CM | POA: Insufficient documentation

## 2020-06-08 DIAGNOSIS — I5042 Chronic combined systolic (congestive) and diastolic (congestive) heart failure: Secondary | ICD-10-CM | POA: Insufficient documentation

## 2020-06-08 DIAGNOSIS — M79604 Pain in right leg: Secondary | ICD-10-CM

## 2020-06-08 DIAGNOSIS — M79605 Pain in left leg: Secondary | ICD-10-CM

## 2020-06-09 ENCOUNTER — Other Ambulatory Visit: Payer: Self-pay | Admitting: Critical Care Medicine

## 2020-06-09 ENCOUNTER — Encounter: Payer: Self-pay | Admitting: Critical Care Medicine

## 2020-06-09 ENCOUNTER — Other Ambulatory Visit (HOSPITAL_COMMUNITY): Payer: Self-pay | Admitting: Critical Care Medicine

## 2020-06-09 DIAGNOSIS — E0865 Diabetes mellitus due to underlying condition with hyperglycemia: Secondary | ICD-10-CM

## 2020-06-09 DIAGNOSIS — I5043 Acute on chronic combined systolic (congestive) and diastolic (congestive) heart failure: Secondary | ICD-10-CM

## 2020-06-09 DIAGNOSIS — IMO0002 Reserved for concepts with insufficient information to code with codable children: Secondary | ICD-10-CM

## 2020-06-09 MED ORDER — LEVEMIR FLEXTOUCH 100 UNIT/ML ~~LOC~~ SOPN: PEN_INJECTOR | SUBCUTANEOUS | 1 refills | Status: DC

## 2020-06-09 MED ORDER — SPIRONOLACTONE 25 MG PO TABS: 25.0000 mg | ORAL_TABLET | Freq: Every day | ORAL | 2 refills | Status: DC

## 2020-06-09 MED ORDER — FEOSOL 200 (65 FE) MG PO TABS
1.0000 | ORAL_TABLET | Freq: Two times a day (BID) | ORAL | 3 refills | Status: DC
Start: 2020-06-09 — End: 2020-07-28

## 2020-06-09 MED ORDER — CARVEDILOL 25 MG PO TABS
25.0000 mg | ORAL_TABLET | Freq: Two times a day (BID) | ORAL | 0 refills | Status: DC
Start: 2020-06-09 — End: 2020-07-07

## 2020-06-09 MED FILL — SPIRONOLACTONE 25 MG TABS: 25 | 30 days supply | Qty: 30 | Fill #0

## 2020-06-09 MED FILL — FERROUS SULFATE 325 MG TAB: 325 (65 FE) | 50 days supply | Qty: 100 | Fill #0

## 2020-06-09 MED FILL — CARVEDILOL 25 MG TABLET: 25 | 30 days supply | Qty: 60 | Fill #0

## 2020-06-10 NOTE — Progress Notes (Signed)
This patient was seen today in the [place] [facility] clinic to have medication reconciliation his pill organizer was refilled note blood pressure 110/68 blood glucose 394 he has not been compliant with his diet  Can increase his Levemir to 30 units in the morning 25 units in the evening and continue the 3 units twice daily NovoLog have to obtain for him refills on his Aldactone and Coreg and iron

## 2020-06-16 ENCOUNTER — Other Ambulatory Visit: Payer: Self-pay | Admitting: Critical Care Medicine

## 2020-06-16 ENCOUNTER — Other Ambulatory Visit (HOSPITAL_COMMUNITY): Payer: Self-pay | Admitting: Critical Care Medicine

## 2020-06-16 ENCOUNTER — Encounter: Payer: Self-pay | Admitting: Critical Care Medicine

## 2020-06-16 DIAGNOSIS — E785 Hyperlipidemia, unspecified: Secondary | ICD-10-CM

## 2020-06-16 DIAGNOSIS — I10 Essential (primary) hypertension: Secondary | ICD-10-CM

## 2020-06-16 DIAGNOSIS — E0865 Diabetes mellitus due to underlying condition with hyperglycemia: Secondary | ICD-10-CM

## 2020-06-16 DIAGNOSIS — I5043 Acute on chronic combined systolic (congestive) and diastolic (congestive) heart failure: Secondary | ICD-10-CM

## 2020-06-16 DIAGNOSIS — IMO0002 Reserved for concepts with insufficient information to code with codable children: Secondary | ICD-10-CM

## 2020-06-16 MED ORDER — LOSARTAN POTASSIUM 100 MG PO TABS: 100.0000 mg | ORAL_TABLET | Freq: Every day | ORAL | 3 refills | Status: DC

## 2020-06-16 MED ORDER — ATORVASTATIN CALCIUM 80 MG PO TABS: 80.0000 mg | ORAL_TABLET | Freq: Every day | ORAL | 3 refills | Status: DC

## 2020-06-16 MED ORDER — SPIRONOLACTONE 25 MG PO TABS: 25.0000 mg | ORAL_TABLET | Freq: Every day | ORAL | 2 refills | Status: DC

## 2020-06-16 MED FILL — ATORVASTATIN 80 MG TABLET: 80 | 30 days supply | Qty: 30 | Fill #0

## 2020-06-16 MED FILL — LOSARTAN POTASSIUM 100 MG T: 100 | 30 days supply | Qty: 30 | Fill #0

## 2020-06-16 NOTE — Progress Notes (Signed)
This patient is seen today in the [facility] clinic needing medication reconciliation and refills on her atorvastatin and losartan and albumin.  On arrival blood sugar is 449 blood pressure 100/60 pulse 71 saturation 99% room air note this patient's been skipping lunch again and skipping his insulin this creates hyperglycemia for this patient  Impression is that of type 1 diabetes poorly controlled  Plan is to refill atorvastatin losartan and Aldactone increase Levemir to 30 units in the morning 30 units in evening increase NovoLog to 5 units in the morning 5 units at lunch and will see this patient back in return follow-up I gave you more Glucerna so we can use this in place of his lunch

## 2020-06-23 ENCOUNTER — Other Ambulatory Visit: Payer: Self-pay | Admitting: Critical Care Medicine

## 2020-06-23 ENCOUNTER — Other Ambulatory Visit (HOSPITAL_COMMUNITY): Payer: Self-pay | Admitting: Critical Care Medicine

## 2020-06-23 ENCOUNTER — Encounter: Payer: Self-pay | Admitting: Critical Care Medicine

## 2020-06-23 DIAGNOSIS — IMO0002 Reserved for concepts with insufficient information to code with codable children: Secondary | ICD-10-CM

## 2020-06-23 DIAGNOSIS — E0865 Diabetes mellitus due to underlying condition with hyperglycemia: Secondary | ICD-10-CM

## 2020-06-23 DIAGNOSIS — K219 Gastro-esophageal reflux disease without esophagitis: Secondary | ICD-10-CM

## 2020-06-23 DIAGNOSIS — I10 Essential (primary) hypertension: Secondary | ICD-10-CM

## 2020-06-23 DIAGNOSIS — E785 Hyperlipidemia, unspecified: Secondary | ICD-10-CM

## 2020-06-23 DIAGNOSIS — I5043 Acute on chronic combined systolic (congestive) and diastolic (congestive) heart failure: Secondary | ICD-10-CM

## 2020-06-23 MED ORDER — TORSEMIDE 20 MG PO TABS: 20.0000 mg | ORAL_TABLET | Freq: Every day | ORAL | 0 refills | Status: DC

## 2020-06-23 MED ORDER — INSULIN ASPART 100 UNIT/ML FLEXPEN
8.0000 [IU] | PEN_INJECTOR | Freq: Two times a day (BID) | SUBCUTANEOUS | 1 refills | Status: DC
Start: 2020-06-23 — End: 2020-07-07

## 2020-06-23 MED ORDER — CREON 6000-19000 UNITS PO CPEP: 2.0000 | ORAL_CAPSULE | Freq: Three times a day (TID) | ORAL | 0 refills | Status: DC

## 2020-06-23 MED ORDER — LOSARTAN POTASSIUM 100 MG PO TABS: 100.0000 mg | ORAL_TABLET | Freq: Every day | ORAL | 3 refills | Status: DC

## 2020-06-23 MED ORDER — INSULIN PEN NEEDLE 29G X 5MM MISC
1 refills | Status: DC
Start: 2020-06-23 — End: 2020-08-05

## 2020-06-23 MED ORDER — PANTOPRAZOLE SODIUM 40 MG PO TBEC: 40.0000 mg | DELAYED_RELEASE_TABLET | Freq: Every day | ORAL | 3 refills | Status: DC

## 2020-06-23 MED ORDER — LEVEMIR FLEXTOUCH 100 UNIT/ML ~~LOC~~ SOPN: PEN_INJECTOR | SUBCUTANEOUS | 1 refills | Status: DC

## 2020-06-23 MED ORDER — ATORVASTATIN CALCIUM 80 MG PO TABS: 80.0000 mg | ORAL_TABLET | Freq: Every day | ORAL | 3 refills | Status: DC

## 2020-06-23 MED FILL — UNIFINE PENTIPS 31GX3/16: 31G X 5 MM | 30 days supply | Qty: 100 | Fill #0

## 2020-06-23 MED FILL — PANTOPRAZOLE SOD DR 40 MG T: 40 | 30 days supply | Qty: 30 | Fill #0

## 2020-06-23 MED FILL — CREON DR 6,000 UNITS CAP: 6000-19000 | 30 days supply | Qty: 180 | Fill #0

## 2020-06-23 NOTE — Progress Notes (Signed)
Med refills

## 2020-06-24 NOTE — Progress Notes (Signed)
This is a 54 year old male seen in the [place] shelter clinic type 1 diabetes pancreatitis history of alcohol use no longer drinking he is needing refills on multiple medications today including his Creon, Protonix, atorvastatin, losartan, and insulin pen needles.  The patient today has a blood glucose greater than 400 he states he has felt a bit more dizzy having constipation sees trace blood in his urine  On exam blood pressure 108/44 saturation 95% room air pulse 72  The patient skin turgor is decreased chest is clear cardiac exam unremarkable  Plan in this patient will be to hold his torsemide and Aldactone for now we refilled his pill organizer for this also the plan is to increase his NovoLog to 8 units twice daily at breakfast and lunch and to continue Levemir 30 units in the morning 30 units in the evening

## 2020-07-05 ENCOUNTER — Other Ambulatory Visit (HOSPITAL_COMMUNITY): Payer: Self-pay | Admitting: Critical Care Medicine

## 2020-07-05 ENCOUNTER — Other Ambulatory Visit: Payer: Self-pay | Admitting: Critical Care Medicine

## 2020-07-05 DIAGNOSIS — IMO0002 Reserved for concepts with insufficient information to code with codable children: Secondary | ICD-10-CM

## 2020-07-05 MED ORDER — LEVEMIR FLEXTOUCH 100 UNIT/ML ~~LOC~~ SOPN: PEN_INJECTOR | SUBCUTANEOUS | 1 refills | Status: DC

## 2020-07-05 MED FILL — LEVEMIR FLEXTOUCH 100 UNITS: 100 | 30 days supply | Qty: 18 | Fill #0

## 2020-07-05 NOTE — Telephone Encounter (Signed)
[facility] called and spoke to [place], Pharmacologist about the Levemir requested. She says they have not received a new Rx and to check with [facility], since he receives medications there as well. I called [facility] and spoke to [place], [facility] about the Levemir. She says they have not received a Rx since 06/02/20. I advised it says in the notes congregational nurse fund. She says the Rx will need to be sent to them for the nurse fund.

## 2020-07-05 NOTE — Telephone Encounter (Signed)
Medication Refill - Medication: insulin detemir (LEVEMIR FLEXTOUCH) 100 UNIT/ML FlexPen   Has the patient contacted their pharmacy? Yes (Agent: If no, request that the patient contact the pharmacy for the refill.) (Agent: If yes, when and what did the pharmacy advise?)Contact PCP  Preferred Pharmacy (with phone number or street name):  [facility] [place], [place] - [address] Phone:  [phone]  Fax:  [phone]       Agent: Please be advised that RX refills may take up to 3 business days. We ask that you follow-up with your pharmacy.

## 2020-07-07 ENCOUNTER — Other Ambulatory Visit (HOSPITAL_COMMUNITY): Payer: Self-pay | Admitting: Critical Care Medicine

## 2020-07-07 ENCOUNTER — Other Ambulatory Visit: Payer: Self-pay | Admitting: Critical Care Medicine

## 2020-07-07 ENCOUNTER — Encounter: Payer: Self-pay | Admitting: Critical Care Medicine

## 2020-07-07 DIAGNOSIS — IMO0002 Reserved for concepts with insufficient information to code with codable children: Secondary | ICD-10-CM

## 2020-07-07 DIAGNOSIS — E0865 Diabetes mellitus due to underlying condition with hyperglycemia: Secondary | ICD-10-CM

## 2020-07-07 MED ORDER — ASPIRIN 81 MG PO CHEW
81.0000 mg | CHEWABLE_TABLET | Freq: Every day | ORAL | 0 refills | Status: DC
Start: 2020-07-07 — End: 2020-11-15

## 2020-07-07 MED ORDER — INSULIN ASPART 100 UNIT/ML FLEXPEN: 8.0000 [IU] | PEN_INJECTOR | Freq: Two times a day (BID) | SUBCUTANEOUS | 1 refills | Status: DC

## 2020-07-07 MED ORDER — CARVEDILOL 25 MG PO TABS
25.0000 mg | ORAL_TABLET | Freq: Two times a day (BID) | ORAL | 0 refills | Status: DC
Start: 2020-07-07 — End: 2020-08-18

## 2020-07-07 MED ORDER — LEVEMIR FLEXTOUCH 100 UNIT/ML ~~LOC~~ SOPN: PEN_INJECTOR | SUBCUTANEOUS | 1 refills | Status: DC

## 2020-07-07 MED FILL — ASPIRIN CHILD 81 MG TAB CHE: 81 | 30 days supply | Qty: 120 | Fill #0

## 2020-07-07 MED FILL — CARVEDILOL 25 MG TABLET: 25 | 30 days supply | Qty: 60 | Fill #0

## 2020-07-07 NOTE — Progress Notes (Signed)
Patient ID: Rodney Barber, male   DOB: 1966-03-16, 54 y.o.   MRN: [id number] Patient seen in return follow-up in the [place] shelter clinic for pill organizer [name] note has been out of his Levemir insulin for 2 days  Blood glucose today is 550  I organize all his pill in his organizer  He needs refills on Coreg and his insulin Levemir and NovoLog insulin and aspirin  He will take NovoLog 20 units tonight and 15 units in the morning till he gets his Levemir doses tomorrow and resume his Levemir at 30 units twice daily and his NovoLog will be subsequently dosed 8 units twice daily breakfast and lunch

## 2020-07-13 ENCOUNTER — Emergency Department (HOSPITAL_COMMUNITY)
Admission: EM | Admit: 2020-07-13 | Discharge: 2020-07-13 | Disposition: A | Discharge status: Home / Self Care | Payer: Self-pay | Attending: Emergency Medicine | Admitting: Emergency Medicine

## 2020-07-13 ENCOUNTER — Encounter (HOSPITAL_COMMUNITY): Payer: Self-pay

## 2020-07-13 ENCOUNTER — Other Ambulatory Visit: Payer: Self-pay

## 2020-07-13 DIAGNOSIS — F17210 Nicotine dependence, cigarettes, uncomplicated: Secondary | ICD-10-CM | POA: Insufficient documentation

## 2020-07-13 DIAGNOSIS — E119 Type 2 diabetes mellitus without complications: Secondary | ICD-10-CM | POA: Insufficient documentation

## 2020-07-13 DIAGNOSIS — R5381 Other malaise: Secondary | ICD-10-CM | POA: Insufficient documentation

## 2020-07-13 DIAGNOSIS — R42 Dizziness and giddiness: Secondary | ICD-10-CM | POA: Insufficient documentation

## 2020-07-13 DIAGNOSIS — I10 Essential (primary) hypertension: Secondary | ICD-10-CM | POA: Insufficient documentation

## 2020-07-13 LAB — URINALYSIS, ROUTINE W REFLEX MICROSCOPIC
Bacteria, UA: NONE SEEN
Bilirubin Urine: NEGATIVE
Glucose, UA: NEGATIVE mg/dL
Ketones, ur: NEGATIVE mg/dL
Nitrite: NEGATIVE
Protein, ur: 30 mg/dL — AB
Specific Gravity, Urine: 1.009 (ref 1.005–1.030)
WBC, UA: >50 WBC/hpf — ABNORMAL HIGH (ref 0–5)
pH: 5 (ref 5.0–8.0)

## 2020-07-13 LAB — BASIC METABOLIC PANEL
Anion gap: 10 (ref 5–15)
BUN: 29 mg/dL — ABNORMAL HIGH (ref 6–20)
CO2: 19 mmol/L — ABNORMAL LOW (ref 22–32)
Calcium: 8.1 mg/dL — ABNORMAL LOW (ref 8.9–10.3)
Chloride: 108 mmol/L (ref 98–111)
Creatinine, Ser: 1.25 mg/dL — ABNORMAL HIGH (ref 0.61–1.24)
GFR, Estimated: >60 mL/min (ref >60)
Glucose, Bld: 85 mg/dL (ref 70–99)
Potassium: 4 mmol/L (ref 3.5–5.1)
Sodium: 137 mmol/L (ref 135–145)

## 2020-07-13 LAB — CBC
HCT: 26.5 % — ABNORMAL LOW (ref 39.0–52.0)
Hemoglobin: 8.3 g/dL — ABNORMAL LOW (ref 13.0–17.0)
MCH: 30 pg (ref 26.0–34.0)
MCHC: 31.3 g/dL (ref 30.0–36.0)
MCV: 95.7 fL (ref 80.0–100.0)
Platelets: 219 10*3/uL (ref 150–400)
RBC: 2.77 MIL/uL — ABNORMAL LOW (ref 4.22–5.81)
RDW: 13.3 % (ref 11.5–15.5)
WBC: 12.2 10*3/uL — ABNORMAL HIGH (ref 4.0–10.5)
nRBC: 0 % (ref 0.0–0.2)

## 2020-07-13 NOTE — ED Notes (Signed)
Pt provided with PO fluid at this time. Pt getting dressed at this time. IV removed at this time

## 2020-07-13 NOTE — ED Triage Notes (Signed)
Pt presents from a homeless shelter via EMS with c/o weakness. Pt reports he experienced some nausea after eating breakfast. Pt was worried his CBG had dropped as he was feeling this way after eating. Pt does not have any complaints of pain at this time. Pt reporting dizziness and weakness at this time.

## 2020-07-13 NOTE — Discharge Instructions (Addendum)
You were evaluated in the Emergency Department and after careful evaluation, we did not find any emergent condition requiring admission or further testing in the hospital.  Your exam/testing today was overall reassuring.  Please return to the Emergency Department if you experience any worsening of your condition.  Thank you for allowing us to be a part of your care.  

## 2020-07-13 NOTE — ED Provider Notes (Signed)
[facility]-EM[facility]ENCY DEPT [facility] Emergency Department Provider Note MRN:  [id number]  Arrival date & time: 07/13/20     Chief Complaint   Weakness   History of Present Illness   Rodney Barber is a 54 y.o. year-old male with a history of hypertension, diabetes presenting to the ED with chief complaint of weakness.  Patient is endorsing general malaise, weakness, lightheadedness after having lunch today.  He explains that he is not having any pain, he simply "needs to rest".  Denies any room spinning sensation, no numbness or weakness to the arms or legs, no chest pain or shortness of breath, no abdominal pain, no headache, no vision change.  Symptoms are mild, constant, no exacerbating or alleviating factors  Review of Systems  A complete 10 system review of systems was obtained and all systems are negative except as noted in the HPI and PMH.   Patient's Health History    Past Medical History:  Diagnosis Date  . Diabetes mellitus   . Hypertension   . Pancreatitis     Past Surgical History:  Procedure Laterality Date  . ERCP  06/27/2011   Procedure: ENDOSCOPIC RETROGRADE CHOLANGIOPANCREATOGRAPHY (ERCP);  Surgeon: Petra Kuba, MD;  Location: [facility] ENDOSCOPY;  Service: Endoscopy;  Laterality: N/A;  . ERCP W/ PLASTIC STENT PLACEMENT  03/2011  . IR US GUIDE BX ASP/DRAIN  05/16/2019  . LEFT HEART CATH AND CORONARY ANGIOGRAPHY N/A 01/02/2020   Procedure: LEFT HEART CATH AND CORONARY ANGIOGRAPHY;  Surgeon: Swaz[place], Peter M, MD;  Location: [facility] INVASIVE CV LAB;  Service: Cardiovascular;  Laterality: N/A;  . TEE WITHOUT CARDIOVERSION N/A 05/20/2019   Procedure: TRANSESOPHAGEAL ECHOCARDIOGRAM (TEE);  Surgeon: Chrystie Nose, MD;  Location: [facility] ENDOSCOPY;  Service: Cardiovascular;  Laterality: N/A;    Family History  Problem Relation Age of Onset  . Diabetes Mother   . Diabetes Brother     Social History   Socioeconomic History  . Marital status: Single    Spouse name: Not on file    . Number of children: Not on file  . Years of education: Not on file  . Highest education level: Not on file  Occupational History  . Not on file  Tobacco Use  . Smoking status: Current Every Day Smoker    Packs/day: 0.50    Years: 30.00    Pack years: 15.00    Types: Cigarettes  . Smokeless tobacco: Never Used  . Tobacco comment: 6 cigarett /day  Vaping Use  . Vaping Use: Never used  Substance and Sexual Activity  . Alcohol use: No  . Drug use: Not Currently    Types: "Crack" cocaine, Other-see comments, Cocaine    Comment: last smoked crack 12-16-16  . Sexual activity: Never  Other Topics Concern  . Not on file  Social History Narrative  . Not on file   Social Determinants of Health   Financial Resource Strain:   . Difficulty of Paying Living Expenses: Not on file  Food Insecurity:   . Worried About Programme researcher, broadcasting/film/video in the Last Year: Not on file  . Ran Out of Food in the Last Year: Not on file  Transportation Needs:   . Lack of Transportation (Medical): Not on file  . Lack of Transportation (Non-Medical): Not on file  Physical Activity:   . Days of Exercise per Week: Not on file  . Minutes of Exercise per Session: Not on file  Stress:   . Feeling of Stress : Not on file  Social Connections:   . Frequency of Communication with Friends and Family: Not on file  . Frequency of Social Gatherings with Friends and Family: Not on file  . Attends Religious Services: Not on file  . Active Member of Clubs or Organizations: Not on file  . Attends Banker Meetings: Not on file  . Marital Status: Not on file  Intimate Partner Violence:   . Fear of Current or Ex-Partner: Not on file  . Emotionally Abused: Not on file  . Physically Abused: Not on file  . Sexually Abused: Not on file     Physical Exam   Vitals:   07/13/20 1119 07/13/20 1407  BP: 111/61 (!) 145/67  Pulse: 70 76  Resp: 17 18  Temp: 98.1 F (36.7 C)   SpO2: 100% 99%    CONSTITUTIONAL:  Well-appearing, NAD NEURO:  Alert and oriented x 3, normal and symmetric strength and sensation, normal coordination, normal speech EYES:  eyes equal and reactive ENT/NECK:  no LAD, no JVD CARDIO: Regular rate, well-perfused, normal S1 and S2 PULM:  CTAB no wheezing or rhonchi GI/GU:  normal bowel sounds, non-distended, non-tender MSK/SPINE:  No gross deformities, no edema SKIN:  no rash, atraumatic PSYCH:  Appropriate speech and behavior  *Additional and/or pertinent findings included in MDM below  Diagnostic and Interventional Summary    EKG Interpretation  Date/Time:  Tuesday July 13 2020 11:18:30 EST Ventricular Rate:  69 PR Interval:    QRS Duration: 85 QT Interval:  410 QTC Calculation: 440 R Axis:   34 Text Interpretation: Sinus rhythm RSR' in V1 or V2, probably normal variant Consider left ventricular hypertrophy ST elevation, consider inferior injury 12 Lead; Mason-Likar Confirmed by Kennis Carina [phone]) on 07/13/2020 1:47:39 PM      Labs Reviewed  BASIC METABOLIC PANEL - Abnormal; Notable for the following components:      Result Value   CO2 19 (*)    BUN 29 (*)    Creatinine, Ser 1.25 (*)    Calcium 8.1 (*)    All other components within normal limits  CBC - Abnormal; Notable for the following components:   WBC 12.2 (*)    RBC 2.77 (*)    Hemoglobin 8.3 (*)    HCT 26.5 (*)    All other components within normal limits  URINALYSIS, ROUTINE W REFLEX MICROSCOPIC    No orders to display    Medications - No data to display   Procedures  /  Critical Care Procedures  ED Course and Medical Decision Making  I have reviewed the triage vital signs, the nursing notes, and pertinent available records from the EMR.  Listed above are laboratory and imaging tests that I personally ordered, reviewed, and interpreted and then considered in my medical decision making (see below for details).  Lightheadedness, history of orthostatic hypotension and lightheadedness.   Normal vital signs, no neurological deficits, patient admits that he is here mostly to rest in a bed.  No chest pain, no shortness of breath, no abdominal pain, nothing to suggest emergent process.  Basic labs and EKG reassuring, appropriate for discharge.       Elmer Sow. Pilar Plate, MD [facility] Emergency [facility] [email or web address]  Final Clinical Impressions(s) / ED Diagnoses     ICD-10-CM   1. Lightheadedness  R42     ED Discharge Orders    None       Discharge Instructions Discussed with and Provided to Patient:  Discharge Instructions     You were evaluated in the Emergency Department and after careful evaluation, we did not find any emergent condition requiring admission or further testing in the hospital.  Your exam/testing today was overall reassuring.  Please return to the Emergency Department if you experience any worsening of your condition.  Thank you for allowing [place] to be a part of your care.       Sabas Sous, MD 07/13/20 [phone]

## 2020-07-14 ENCOUNTER — Encounter: Payer: Self-pay | Admitting: Critical Care Medicine

## 2020-07-14 ENCOUNTER — Other Ambulatory Visit: Payer: Self-pay | Admitting: Critical Care Medicine

## 2020-07-14 MED ORDER — INSULIN ASPART 100 UNIT/ML FLEXPEN: 5.0000 [IU] | PEN_INJECTOR | Freq: Two times a day (BID) | SUBCUTANEOUS | 1 refills | Status: DC

## 2020-07-14 NOTE — Progress Notes (Signed)
Patient ID: Rodney Barber, male   DOB: [birth year], 54 y.o.   MRN: [id number] This is a 54 year old male seen in the [place] [facility] who has type 2 diabetes coronary disease chronic heart failure  Patient was in the emergency room yesterday with lightheadedness found to have a blood glucose of 86 but no other abnormalities were seen he was sent home without treatment  Today on exam blood pressure 108/50 pulse 73 saturation 99% room air glucose is 153 chest exam abdomen and remainder of exam are all unremarkable  Impression is that of mild volume depletion and normal glycemia which is a new sensory experience for this patient  Plan for this patient is to hold torsemide and Aldactone for now and to reduce NovoLog to 5 units twice daily and continue Levemir 30 units twice daily  Refills on NovoLog will be obtained for this patient

## 2020-07-15 ENCOUNTER — Encounter: Payer: Self-pay | Admitting: Critical Care Medicine

## 2020-07-15 ENCOUNTER — Other Ambulatory Visit (HOSPITAL_COMMUNITY): Payer: Self-pay | Admitting: Critical Care Medicine

## 2020-07-15 MED FILL — HUMALOG 100 UNITS/ML KWIKPE: 100 | 30 days supply | Qty: 3 | Fill #0

## 2020-07-21 ENCOUNTER — Encounter: Payer: Self-pay | Admitting: Critical Care Medicine

## 2020-07-21 ENCOUNTER — Other Ambulatory Visit (HOSPITAL_COMMUNITY): Payer: Self-pay | Admitting: Critical Care Medicine

## 2020-07-21 ENCOUNTER — Other Ambulatory Visit: Payer: Self-pay | Admitting: Critical Care Medicine

## 2020-07-21 DIAGNOSIS — E785 Hyperlipidemia, unspecified: Secondary | ICD-10-CM

## 2020-07-21 DIAGNOSIS — I10 Essential (primary) hypertension: Secondary | ICD-10-CM

## 2020-07-21 DIAGNOSIS — IMO0002 Reserved for concepts with insufficient information to code with codable children: Secondary | ICD-10-CM

## 2020-07-21 MED ORDER — LOSARTAN POTASSIUM 100 MG PO TABS: 100.0000 mg | ORAL_TABLET | Freq: Every day | ORAL | 3 refills | Status: DC

## 2020-07-21 MED ORDER — ATORVASTATIN CALCIUM 80 MG PO TABS: 80.0000 mg | ORAL_TABLET | Freq: Every day | ORAL | 3 refills | Status: DC

## 2020-07-21 NOTE — Progress Notes (Signed)
Patient ID: Rodney Barber, male   DOB: 11-10-1965, 54 y.o.   MRN: [id number] This is a 54 year old male with severe type 2 diabetes he states this week he is less dizzy he is here to have his medications refilled  On exam blood pressure is 162/60 pulse 83 saturation 99% blood glucose is 309  Physical exam is unremarkable  Plan is to continue to hold the torsemide and Aldactone and he will get refills on atorvastatin and losartan no change in medications are made

## 2020-07-22 MED FILL — ATORVASTATIN 80 MG TABLET: 80 | 30 days supply | Qty: 30 | Fill #0

## 2020-07-22 MED FILL — LOSARTAN POTASSIUM 100 MG T: 100 | 30 days supply | Qty: 30 | Fill #0

## 2020-07-28 ENCOUNTER — Other Ambulatory Visit (HOSPITAL_COMMUNITY): Payer: Self-pay | Admitting: Critical Care Medicine

## 2020-07-28 ENCOUNTER — Encounter: Payer: Self-pay | Admitting: Critical Care Medicine

## 2020-07-28 ENCOUNTER — Other Ambulatory Visit: Payer: Self-pay | Admitting: Critical Care Medicine

## 2020-07-28 DIAGNOSIS — K219 Gastro-esophageal reflux disease without esophagitis: Secondary | ICD-10-CM

## 2020-07-28 DIAGNOSIS — I10 Essential (primary) hypertension: Secondary | ICD-10-CM

## 2020-07-28 DIAGNOSIS — E785 Hyperlipidemia, unspecified: Secondary | ICD-10-CM

## 2020-07-28 DIAGNOSIS — IMO0002 Reserved for concepts with insufficient information to code with codable children: Secondary | ICD-10-CM

## 2020-07-28 MED ORDER — LOSARTAN POTASSIUM 100 MG PO TABS: 100.0000 mg | ORAL_TABLET | Freq: Every day | ORAL | 3 refills | Status: DC

## 2020-07-28 MED ORDER — FEOSOL 200 (65 FE) MG PO TABS: 1.0000 | ORAL_TABLET | Freq: Two times a day (BID) | ORAL | 3 refills | Status: DC

## 2020-07-28 MED ORDER — TORSEMIDE 20 MG PO TABS: 20.0000 mg | ORAL_TABLET | Freq: Every day | ORAL | 0 refills | Status: DC

## 2020-07-28 MED ORDER — ATORVASTATIN CALCIUM 80 MG PO TABS: 80.0000 mg | ORAL_TABLET | Freq: Every day | ORAL | 3 refills | Status: DC

## 2020-07-28 MED ORDER — SPIRONOLACTONE 25 MG PO TABS: 25.0000 mg | ORAL_TABLET | Freq: Every day | ORAL | 0 refills | Status: DC

## 2020-07-28 MED ORDER — PANTOPRAZOLE SODIUM 40 MG PO TBEC: 40.0000 mg | DELAYED_RELEASE_TABLET | Freq: Every day | ORAL | 3 refills | Status: DC

## 2020-07-28 MED FILL — SPIRONOLACTONE 25 MG TABS: 25 | 30 days supply | Qty: 30 | Fill #0

## 2020-07-28 MED FILL — FERROUS SULFATE 325 MG TAB: 325 (65 FE) | 30 days supply | Qty: 100 | Fill #0

## 2020-07-28 MED FILL — PANTOPRAZOLE SOD DR 40 MG T: 40 | 30 days supply | Qty: 30 | Fill #0

## 2020-07-28 MED FILL — TORSEMIDE 20 MG TABLET: 20 | 30 days supply | Qty: 30 | Fill #0

## 2020-07-28 NOTE — Progress Notes (Signed)
Patient ID: Rodney Barber, male   DOB: 25-Aug-1965, 54 y.o.   MRN: [id number] This a 54 year old male be seen frequently at the [place] [facility] today is no different he comes in to have his medications assessed in his pill organizer report and filled he has type 2 diabetes hypertension heart disease  On exam blood pressure 150/64 pulse 83 saturation 98% blood glucose today is 77 which is the best is been in a while  Note he has been off diuretics for several weeks  On exam he has abdominal fullness 3+ edema chest that was clear cardiac exam unchanged  Impression is that of type 2 diabetes and now with progressive volume retention  Plan is to resume torsemide 20 mg daily Aldactone 25 mg daily refills will be given on these medications also refills will be sent in on his other medications that are currently empty his pill organizer was refilled

## 2020-08-04 ENCOUNTER — Encounter: Payer: Self-pay | Admitting: Physician Assistant

## 2020-08-05 ENCOUNTER — Other Ambulatory Visit (HOSPITAL_COMMUNITY): Payer: Self-pay | Admitting: Critical Care Medicine

## 2020-08-05 ENCOUNTER — Other Ambulatory Visit: Payer: Self-pay | Admitting: Critical Care Medicine

## 2020-08-05 MED ORDER — INSULIN PEN NEEDLE 29G X 5MM MISC
1 refills | Status: DC
Start: 2020-08-05 — End: 2020-09-01

## 2020-08-05 MED FILL — UNIFINE PENTIPS 31GX3/16: 31G X 5 MM | 30 days supply | Qty: 100 | Fill #0

## 2020-08-05 NOTE — Progress Notes (Signed)
Med refills

## 2020-08-09 MED FILL — LEVEMIR FLEXTOUCH 100 UNITS: 100 | 30 days supply | Qty: 18 | Fill #0

## 2020-08-10 ENCOUNTER — Other Ambulatory Visit: Payer: Self-pay | Admitting: Critical Care Medicine

## 2020-08-10 MED ORDER — INSULIN ASPART 100 UNIT/ML FLEXPEN: 5.0000 [IU] | PEN_INJECTOR | Freq: Two times a day (BID) | SUBCUTANEOUS | 1 refills | Status: DC

## 2020-08-10 NOTE — Progress Notes (Signed)
Insulin refill

## 2020-08-11 ENCOUNTER — Encounter: Payer: Self-pay | Admitting: Critical Care Medicine

## 2020-08-11 NOTE — Progress Notes (Signed)
CBG 206  Pt states CBG has been low at times. Still eats candy occasionally, had  Twix today.  Compliant w/ meds.  Was able to walk to the laundromat and back w/out difficulty. However has been having nasal congestion, would like help w/ this.  Needs: Unify pen tips, only has 2 left. Given cough drops and Tylenol  Theodore Demark, PA-C

## 2020-08-11 NOTE — Progress Notes (Signed)
Patient ID: Rodney Barber, male   DOB: Jul 08, 1966, 55 y.o.   MRN: [id number] This a 55 year old male seen today in the [place] [facility] clinic he has type 2 diabetes and chronic pancreatitis hypertension  I brought in his refills on Levemir 30 units twice daily and NovoLog 5 units twice daily breakfast and lunch also we refilled his medication organizer

## 2020-08-18 ENCOUNTER — Encounter: Payer: Self-pay | Admitting: Critical Care Medicine

## 2020-08-18 ENCOUNTER — Other Ambulatory Visit: Payer: Self-pay | Admitting: Critical Care Medicine

## 2020-08-18 ENCOUNTER — Other Ambulatory Visit (HOSPITAL_COMMUNITY): Payer: Self-pay | Admitting: Critical Care Medicine

## 2020-08-18 DIAGNOSIS — K219 Gastro-esophageal reflux disease without esophagitis: Secondary | ICD-10-CM

## 2020-08-18 MED ORDER — CREON 6000-19000 UNITS PO CPEP: 1.0000 | ORAL_CAPSULE | Freq: Three times a day (TID) | ORAL | 0 refills | Status: DC

## 2020-08-18 MED ORDER — PANTOPRAZOLE SODIUM 40 MG PO TBEC: 40.0000 mg | DELAYED_RELEASE_TABLET | Freq: Every day | ORAL | 3 refills | Status: DC

## 2020-08-18 MED ORDER — CARVEDILOL 25 MG PO TABS
25.0000 mg | ORAL_TABLET | Freq: Two times a day (BID) | ORAL | 0 refills | Status: DC
Start: 2020-08-18 — End: 2020-09-15

## 2020-08-18 MED FILL — CARVEDILOL 25 MG TABLET: 25 | 30 days supply | Qty: 60 | Fill #0

## 2020-08-18 NOTE — Progress Notes (Signed)
Patient ID: Rodney Barber, male   DOB: July 21, 1966, 55 y.o.   MRN: [id number] This 55 year old male history of type 2 diabetes coronary disease needing refills on his medications  On exam blood pressure 159/68 pulse 78 saturation 100% room air chest was clear cardiac exam unremarkable abdomen protuberant extremities showed no edema blood sugar was 250  I have refilled his proton pump inhibitor and Coreg and sent this to the [facility] outpatient pharmacy no other medication changes were made at this visit

## 2020-08-25 ENCOUNTER — Encounter: Payer: Self-pay | Admitting: Critical Care Medicine

## 2020-08-25 ENCOUNTER — Other Ambulatory Visit: Payer: Self-pay | Admitting: Critical Care Medicine

## 2020-08-25 ENCOUNTER — Other Ambulatory Visit (HOSPITAL_COMMUNITY): Payer: Self-pay | Admitting: Critical Care Medicine

## 2020-08-25 DIAGNOSIS — E785 Hyperlipidemia, unspecified: Secondary | ICD-10-CM

## 2020-08-25 DIAGNOSIS — IMO0002 Reserved for concepts with insufficient information to code with codable children: Secondary | ICD-10-CM

## 2020-08-25 DIAGNOSIS — I10 Essential (primary) hypertension: Secondary | ICD-10-CM

## 2020-08-25 DIAGNOSIS — E0865 Diabetes mellitus due to underlying condition with hyperglycemia: Secondary | ICD-10-CM

## 2020-08-25 DIAGNOSIS — K219 Gastro-esophageal reflux disease without esophagitis: Secondary | ICD-10-CM

## 2020-08-25 MED ORDER — SPIRONOLACTONE 25 MG PO TABS
25.0000 mg | ORAL_TABLET | Freq: Every day | ORAL | 0 refills | Status: DC
Start: 2020-08-25 — End: 2020-09-15

## 2020-08-25 MED ORDER — CREON 6000-19000 UNITS PO CPEP: 1.0000 | ORAL_CAPSULE | Freq: Three times a day (TID) | ORAL | 0 refills | Status: AC

## 2020-08-25 MED ORDER — LOSARTAN POTASSIUM 100 MG PO TABS: 100.0000 mg | ORAL_TABLET | Freq: Every day | ORAL | 3 refills | Status: DC

## 2020-08-25 MED ORDER — PANTOPRAZOLE SODIUM 40 MG PO TBEC: 40.0000 mg | DELAYED_RELEASE_TABLET | Freq: Every day | ORAL | 3 refills | Status: DC

## 2020-08-25 MED ORDER — ATORVASTATIN CALCIUM 80 MG PO TABS: 80.0000 mg | ORAL_TABLET | Freq: Every day | ORAL | 3 refills | Status: DC

## 2020-08-25 NOTE — Progress Notes (Signed)
55 year old male who is seen weekly in the [place] [facility] clinic  He has had hypoglycemic episodes at night and we have determined that he is taking his Humalog at night and the reason is his breakfast is at 7 AM and he is gets an early lunch at 10:30 AM and then does not take insulin throughout the day and then takes it late at night when he is not eating  We have recommended to this patient to take his Humalog 8 units at lunch in the middle of the day with a Glucerna drink and 8 units in the morning with breakfast and he is to stay on his Levemir 30 units twice daily  On exam blood pressure 110/50 pulse is 66 saturation 100% blood sugar today is 345  Plan is for this patient to receive refills on Protonix atorvastatin losartan and Creon no changes in his other medications

## 2020-08-26 MED FILL — ATORVASTATIN 80 MG TABLET: 80 | 30 days supply | Qty: 30 | Fill #0

## 2020-08-26 MED FILL — LOSARTAN POTASSIUM 100 MG T: 100 | 30 days supply | Qty: 30 | Fill #0

## 2020-08-26 MED FILL — PANTOPRAZOLE SOD DR 40 MG T: 40 | 30 days supply | Qty: 30 | Fill #0

## 2020-08-26 MED FILL — SPIRONOLACTONE 25 MG TABS: 25 | 30 days supply | Qty: 30 | Fill #0

## 2020-08-27 MED FILL — CREON DR 6,000 UNITS CAP: 6000-19000 | 30 days supply | Qty: 180 | Fill #1

## 2020-09-01 ENCOUNTER — Encounter: Payer: Self-pay | Admitting: Critical Care Medicine

## 2020-09-01 ENCOUNTER — Other Ambulatory Visit: Payer: Self-pay | Admitting: Critical Care Medicine

## 2020-09-01 ENCOUNTER — Other Ambulatory Visit (HOSPITAL_COMMUNITY): Payer: Self-pay | Admitting: Critical Care Medicine

## 2020-09-01 MED ORDER — INSULIN PEN NEEDLE 29G X 5MM MISC
1 refills | Status: DC
Start: 2020-09-01 — End: 2020-09-29

## 2020-09-01 MED ORDER — INSULIN LISPRO (1 UNIT DIAL) 100 UNIT/ML (KWIKPEN): 8.0000 [IU] | PEN_INJECTOR | Freq: Two times a day (BID) | SUBCUTANEOUS | 1 refills | Status: DC

## 2020-09-01 MED ORDER — SULFAMETHOXAZOLE-TRIMETHOPRIM 800-160 MG PO TABS: 1.0000 | ORAL_TABLET | Freq: Two times a day (BID) | ORAL | 0 refills | Status: DC

## 2020-09-01 MED FILL — SULFAMETHOXAZOLE-TMP DS TAB: 800-160 | 7 days supply | Qty: 14 | Fill #0

## 2020-09-01 MED FILL — HUMALOG 100 UNITS/ML KWIKPE: 100 | 19 days supply | Qty: 3 | Fill #0

## 2020-09-01 NOTE — Progress Notes (Signed)
Med refills.   Bactrim for facial infection

## 2020-09-02 ENCOUNTER — Encounter: Payer: Self-pay | Admitting: Pediatric Intensive Care

## 2020-09-02 MED FILL — UNIFINE PENTIPS 31GX3/16: 31G X 5 MM | 30 days supply | Qty: 100 | Fill #0

## 2020-09-02 NOTE — Progress Notes (Signed)
This is a 55 year old male seen in the [place] [facility] clinic been seeing him weekly he has type 2 diabetes hypertension cardiovascular disease coronary disease protein calorie malnutrition  He complains of swelling in the right cheekbone for the past week he has no fevers no drainage no other dental issues  On exam blood pressure 130/60 temp 98 pulse 73 saturation 98% room air blood glucose 168  The face shows tenderness and fluctuance along the right cheek with this area of abrasion remainder the exam is unremarkable impression is that of early cellulitis of the face with swelling no dental issues type 2 diabetes hypertension both of which are improved  No change in insulin program is made at this visit we will give Bactrim double strength twice a day for 7 days he will get refills on his pen needles and Humalog insulin

## 2020-09-03 NOTE — Congregational Nurse Program (Signed)
  Dept: [phone]   Congregational Nurse Program Note  Date of Encounter: 09/02/2020  Past Medical History: Past Medical History:  Diagnosis Date  . Diabetes mellitus   . Hypertension   . Pancreatitis     Encounter Details: Medication handoff for client. CN advised client to keep bactrim in his pocket and take one pill in morning and one in evening until finished. Follow up with Dr [name] in clinic on Wednesday. Shann Medal RN BSN CNP [phone]

## 2020-09-06 ENCOUNTER — Telehealth: Payer: Self-pay | Admitting: Critical Care Medicine

## 2020-09-06 NOTE — Telephone Encounter (Signed)
Copied from CRM [id number]. Topic: General - Other >> Sep 02, 2020  2:49 PM Jaquita Rector A wrote: Reason for CRM: Patient called to inform Dr [name] that he have not yet received his medications and BD pen needles. Say the nurse was to drop it off but have not gotten there yet. Please be advised Ph#  [phone]

## 2020-09-07 NOTE — Telephone Encounter (Signed)
Will have clinic RN check  I was told all med and pen needles were delivered

## 2020-09-08 ENCOUNTER — Telehealth: Payer: Self-pay | Admitting: *Deleted

## 2020-09-08 ENCOUNTER — Encounter: Payer: Self-pay | Admitting: Critical Care Medicine

## 2020-09-08 MED FILL — LEVEMIR FLEXTOUCH 100 UNITS: 100 | 30 days supply | Qty: 18 | Fill #1

## 2020-09-08 NOTE — Telephone Encounter (Signed)
Spoke w/ pt he has not been taking meds as ordered and is somewhat confused. He does not check his cbg daily, last checked when he saw dr [name] last week. Had him check cbg today and he did well using meter cbg 185 at appr 1315. Stated he had just eaten maybe an hour before. Went thru his meds and gave him all that were due. Did not give him insulin at this time. He will see dr [name] this pm, he is concerned about his R cheek, states he has been taking his abx and produces them from his coat pocket. Will fill pillbox. [facility] and will pick up his meds 09/20/2020 .

## 2020-09-09 ENCOUNTER — Telehealth: Payer: Self-pay | Admitting: *Deleted

## 2020-09-09 NOTE — Telephone Encounter (Signed)
Perfect thank you!

## 2020-09-09 NOTE — Telephone Encounter (Signed)
Spoke w/ pt this morning. He has not checked his blood sugar today He has not taken any of his medications for morning He has not taken insulin Stated he did not like breakfast and did not eat He was encouraged to eat his meals at least 1/2 and encouraged to check blood sugar We agreed that he will do so from now on and nurse would f/u with him Monday. Have several ideas to possibly get him on track will speak w/ staff at [facility] to see if thye can provide assist

## 2020-09-09 NOTE — Progress Notes (Signed)
Patient ID: Rodney Barber, male   DOB: 06/12/1966, 55 y.o.   MRN: [id number] This is a 55 year old male seen today at the [place] [facility] clinic type 2 diabetes cardiovascular disease hypertension he has been missing doses of insulin according to the staff and has not been fully present in the clinic at times.  On exam blood pressure 120/60 pulse is 74 saturation 99% room air blood glucose is 300  Patient had a facial abscess this was examined this is completely resolved with course of antibiotic therapy remainder of exam is unremarkable  Impression is type 2 diabetes poor control due to in adequate nutritional sourcing and erratic use of insulin for this patient was counseled on more consistent use of the insulin product and the nurse will now track him on a more frequent basis that is recently been hired into the clinic  Hypertension is controlled cardiovascular disease stable  Right facial abscess is resolved patient has 2 more doses of Bactrim he is instructed to finish this

## 2020-09-15 ENCOUNTER — Other Ambulatory Visit: Payer: Self-pay | Admitting: Critical Care Medicine

## 2020-09-15 ENCOUNTER — Encounter: Payer: Self-pay | Admitting: Critical Care Medicine

## 2020-09-15 ENCOUNTER — Other Ambulatory Visit (HOSPITAL_COMMUNITY): Payer: Self-pay | Admitting: Critical Care Medicine

## 2020-09-15 DIAGNOSIS — IMO0002 Reserved for concepts with insufficient information to code with codable children: Secondary | ICD-10-CM

## 2020-09-15 DIAGNOSIS — K219 Gastro-esophageal reflux disease without esophagitis: Secondary | ICD-10-CM

## 2020-09-15 DIAGNOSIS — I10 Essential (primary) hypertension: Secondary | ICD-10-CM

## 2020-09-15 DIAGNOSIS — E785 Hyperlipidemia, unspecified: Secondary | ICD-10-CM

## 2020-09-15 DIAGNOSIS — E0865 Diabetes mellitus due to underlying condition with hyperglycemia: Secondary | ICD-10-CM

## 2020-09-15 MED ORDER — SPIRONOLACTONE 25 MG PO TABS
25.0000 mg | ORAL_TABLET | Freq: Every day | ORAL | 0 refills | Status: DC
Start: 2020-09-15 — End: 2020-10-27

## 2020-09-15 MED ORDER — FEOSOL 200 (65 FE) MG PO TABS: 1.0000 | ORAL_TABLET | Freq: Two times a day (BID) | ORAL | 3 refills | Status: DC

## 2020-09-15 MED ORDER — CARVEDILOL 25 MG PO TABS
25.0000 mg | ORAL_TABLET | Freq: Two times a day (BID) | ORAL | 0 refills | Status: DC
Start: 2020-09-15 — End: 2020-10-18

## 2020-09-15 MED ORDER — TORSEMIDE 20 MG PO TABS: 20.0000 mg | ORAL_TABLET | Freq: Every day | ORAL | 0 refills | Status: DC

## 2020-09-15 MED ORDER — ATORVASTATIN CALCIUM 80 MG PO TABS: 80.0000 mg | ORAL_TABLET | Freq: Every day | ORAL | 3 refills | Status: DC

## 2020-09-15 MED ORDER — LOSARTAN POTASSIUM 100 MG PO TABS: 100.0000 mg | ORAL_TABLET | Freq: Every day | ORAL | 3 refills | Status: DC

## 2020-09-15 MED ORDER — PANTOPRAZOLE SODIUM 40 MG PO TBEC: 40.0000 mg | DELAYED_RELEASE_TABLET | Freq: Every day | ORAL | 3 refills | Status: DC

## 2020-09-16 MED FILL — ATORVASTATIN 80 MG TABLET: 80 | 30 days supply | Qty: 30 | Fill #0

## 2020-09-16 MED FILL — TORSEMIDE 20 MG TABLET: 20 | 30 days supply | Qty: 30 | Fill #0

## 2020-09-16 MED FILL — PANTOPRAZOLE SOD DR 40 MG T: 40 | 30 days supply | Qty: 30 | Fill #0

## 2020-09-16 MED FILL — LOSARTAN POTASSIUM 100 MG T: 100 | 30 days supply | Qty: 30 | Fill #0

## 2020-09-16 MED FILL — CARVEDILOL 25 MG TABLET: 25 | 30 days supply | Qty: 60 | Fill #0

## 2020-09-16 MED FILL — SPIRONOLACTONE 25 MG TABS: 25 | 30 days supply | Qty: 30 | Fill #0

## 2020-09-16 NOTE — Progress Notes (Signed)
Patient ID: Rodney Barber, male   DOB: 21-Mar-1966, 55 y.o.   MRN: [id number] Is a 55 year old male seen at the [place] [facility]  History of type 2 diabetes pancreatic insufficiency coronary disease heart failure hypertension  Patient has upcoming appointments for disability determination and we coordinated with him and our clinic nurse to ensure he will arrive at those appointments.  We also refilled his pill organizer note his blood sugar today was 370 blood pressure 120/60 saturation 99% pulse 72  Plan is to ensure this patient follows up with his disability eval

## 2020-09-22 ENCOUNTER — Encounter: Payer: Self-pay | Admitting: *Deleted

## 2020-09-23 ENCOUNTER — Telehealth: Payer: Self-pay | Admitting: Critical Care Medicine

## 2020-09-23 NOTE — Telephone Encounter (Signed)
Copied from CRM [id number]. Topic: General - Inquiry >> Sep 23, 2020  1:56 PM [name] wrote: Reason for CRM: Pt called saying he needs iron pills.  He states he missed the nurse visit at the [facility].  Pt states Dr. [name] knows how to get I touch with him.

## 2020-09-24 NOTE — Telephone Encounter (Signed)
Patient name and DOB verified. Patient aware that medication was sent to pharmacy on 09/15/2020. Advised to call pharmacy to see if Rx is ready. Patient states he has the phone number to the pharmacy.

## 2020-09-29 ENCOUNTER — Other Ambulatory Visit: Payer: Self-pay | Admitting: Critical Care Medicine

## 2020-09-29 ENCOUNTER — Encounter: Payer: Self-pay | Admitting: Critical Care Medicine

## 2020-09-29 MED ORDER — INSULIN LISPRO (1 UNIT DIAL) 100 UNIT/ML (KWIKPEN): 8.0000 [IU] | PEN_INJECTOR | Freq: Two times a day (BID) | SUBCUTANEOUS | 1 refills | Status: DC

## 2020-09-29 MED ORDER — FEOSOL 200 (65 FE) MG PO TABS: 1.0000 | ORAL_TABLET | Freq: Two times a day (BID) | ORAL | 3 refills | Status: DC

## 2020-09-29 MED ORDER — INSULIN PEN NEEDLE 29G X 5MM MISC
1 refills | Status: DC
Start: 2020-09-29 — End: 2020-11-15

## 2020-09-30 MED FILL — ?HUMALOG 100 UNITS/ML KWIKP: 100 | 18 days supply | Qty: 3 | Fill #0

## 2020-09-30 MED FILL — TRUEPLUS PEN NDL 31GX3/16: 31G X 5 MM | 25 days supply | Qty: 100 | Fill #0

## 2020-09-30 MED FILL — FERROUS SULFATE 325 MG TAB: 325 (65 FE) | 30 days supply | Qty: 60 | Fill #0

## 2020-09-30 NOTE — Progress Notes (Signed)
Patient ID: Rodney Barber, male   DOB: 06-Jan-1966, 56 y.o.   MRN: [id number] This is a 55 year old male seen in the [place] [facility] clinic with diabetes coronary disease hypertension chronic pancreatitis with pancreatic insufficiency  On arrival blood sugar 08/09/2002 blood pressure 120/68 pulse 77 saturation 99%  We have refilled his Humalog and his iron supplement all other medications were reorganized and placed into a pill organizer for him no changes were made at this visit

## 2020-09-30 NOTE — Telephone Encounter (Signed)
Received another CRM from [facility]:   Patient called to inform the doctor that the nurse did not bring his pen needles or his Humalog to the [facility] for him.  Please call to discuss at [phone]   Please follow up if appropriate.

## 2020-10-01 NOTE — Telephone Encounter (Signed)
Patient name and DOB verified. Patient states he got medication today.

## 2020-10-06 ENCOUNTER — Emergency Department (HOSPITAL_COMMUNITY): Payer: Medicaid Other

## 2020-10-06 ENCOUNTER — Encounter (HOSPITAL_COMMUNITY): Payer: Self-pay | Admitting: Emergency Medicine

## 2020-10-06 ENCOUNTER — Emergency Department (HOSPITAL_COMMUNITY)
Admission: EM | Admit: 2020-10-06 | Discharge: 2020-10-07 | Disposition: A | Discharge status: Home / Self Care | Payer: Medicaid Other | Attending: Emergency Medicine | Admitting: Emergency Medicine

## 2020-10-06 ENCOUNTER — Other Ambulatory Visit: Payer: Self-pay

## 2020-10-06 ENCOUNTER — Other Ambulatory Visit (HOSPITAL_COMMUNITY): Payer: Self-pay | Admitting: Physician Assistant

## 2020-10-06 DIAGNOSIS — I110 Hypertensive heart disease with heart failure: Secondary | ICD-10-CM | POA: Insufficient documentation

## 2020-10-06 DIAGNOSIS — Z7982 Long term (current) use of aspirin: Secondary | ICD-10-CM | POA: Insufficient documentation

## 2020-10-06 DIAGNOSIS — E785 Hyperlipidemia, unspecified: Secondary | ICD-10-CM | POA: Insufficient documentation

## 2020-10-06 DIAGNOSIS — I2510 Atherosclerotic heart disease of native coronary artery without angina pectoris: Secondary | ICD-10-CM | POA: Diagnosis not present

## 2020-10-06 DIAGNOSIS — R112 Nausea with vomiting, unspecified: Secondary | ICD-10-CM | POA: Diagnosis not present

## 2020-10-06 DIAGNOSIS — Z79899 Other long term (current) drug therapy: Secondary | ICD-10-CM | POA: Insufficient documentation

## 2020-10-06 DIAGNOSIS — I5042 Chronic combined systolic (congestive) and diastolic (congestive) heart failure: Secondary | ICD-10-CM | POA: Insufficient documentation

## 2020-10-06 DIAGNOSIS — R1084 Generalized abdominal pain: Secondary | ICD-10-CM | POA: Insufficient documentation

## 2020-10-06 DIAGNOSIS — F17210 Nicotine dependence, cigarettes, uncomplicated: Secondary | ICD-10-CM | POA: Insufficient documentation

## 2020-10-06 DIAGNOSIS — N3001 Acute cystitis with hematuria: Secondary | ICD-10-CM

## 2020-10-06 DIAGNOSIS — U071 COVID-19: Secondary | ICD-10-CM | POA: Insufficient documentation

## 2020-10-06 DIAGNOSIS — Z794 Long term (current) use of insulin: Secondary | ICD-10-CM | POA: Diagnosis not present

## 2020-10-06 DIAGNOSIS — N200 Calculus of kidney: Secondary | ICD-10-CM

## 2020-10-06 DIAGNOSIS — E1169 Type 2 diabetes mellitus with other specified complication: Secondary | ICD-10-CM | POA: Diagnosis not present

## 2020-10-06 LAB — RESP PANEL BY RT-PCR (FLU A&B, COVID) ARPGX2
Influenza A by PCR: NEGATIVE
Influenza B by PCR: NEGATIVE
SARS Coronavirus 2 by RT PCR: POSITIVE — AB

## 2020-10-06 LAB — CBC WITH DIFFERENTIAL/PLATELET
Abs Immature Granulocytes: 0.03 10*3/uL (ref 0.00–0.07)
Basophils Absolute: 0.1 10*3/uL (ref 0.0–0.1)
Basophils Relative: 1 %
Eosinophils Absolute: 0.3 10*3/uL (ref 0.0–0.5)
Eosinophils Relative: 3 %
HCT: 30.2 % — ABNORMAL LOW (ref 39.0–52.0)
Hemoglobin: 9.7 g/dL — ABNORMAL LOW (ref 13.0–17.0)
Immature Granulocytes: 0 %
Lymphocytes Relative: 11 %
Lymphs Abs: 1.2 10*3/uL (ref 0.7–4.0)
MCH: 29.4 pg (ref 26.0–34.0)
MCHC: 32.1 g/dL (ref 30.0–36.0)
MCV: 91.5 fL (ref 80.0–100.0)
Monocytes Absolute: 0.4 10*3/uL (ref 0.1–1.0)
Monocytes Relative: 4 %
Neutro Abs: 8.7 10*3/uL — ABNORMAL HIGH (ref 1.7–7.7)
Neutrophils Relative %: 81 %
Platelets: 202 10*3/uL (ref 150–400)
RBC: 3.3 MIL/uL — ABNORMAL LOW (ref 4.22–5.81)
RDW: 13.6 % (ref 11.5–15.5)
WBC: 10.7 10*3/uL — ABNORMAL HIGH (ref 4.0–10.5)
nRBC: 0 % (ref 0.0–0.2)

## 2020-10-06 LAB — COMPREHENSIVE METABOLIC PANEL
ALT: 18 U/L (ref 0–44)
AST: 21 U/L (ref 15–41)
Albumin: 4.2 g/dL (ref 3.5–5.0)
Alkaline Phosphatase: 81 U/L (ref 38–126)
Anion gap: 11 (ref 5–15)
BUN: 38 mg/dL — ABNORMAL HIGH (ref 6–20)
CO2: 20 mmol/L — ABNORMAL LOW (ref 22–32)
Calcium: 9.3 mg/dL (ref 8.9–10.3)
Chloride: 106 mmol/L (ref 98–111)
Creatinine, Ser: 1.2 mg/dL (ref 0.61–1.24)
GFR, Estimated: >60 mL/min (ref >60)
Glucose, Bld: 154 mg/dL — ABNORMAL HIGH (ref 70–99)
Potassium: 3.9 mmol/L (ref 3.5–5.1)
Sodium: 137 mmol/L (ref 135–145)
Total Bilirubin: 0.8 mg/dL (ref 0.3–1.2)
Total Protein: 7.5 g/dL (ref 6.5–8.1)

## 2020-10-06 LAB — RAPID URINE DRUG SCREEN, HOSP PERFORMED
Amphetamines: NOT DETECTED
Barbiturates: NOT DETECTED
Benzodiazepines: NOT DETECTED
Cocaine: NOT DETECTED
Opiates: NOT DETECTED
Tetrahydrocannabinol: NOT DETECTED

## 2020-10-06 LAB — URINALYSIS, ROUTINE W REFLEX MICROSCOPIC
Bacteria, UA: NONE SEEN
Bilirubin Urine: NEGATIVE
Glucose, UA: NEGATIVE mg/dL
Ketones, ur: NEGATIVE mg/dL
Nitrite: POSITIVE — AB
Protein, ur: 100 mg/dL — AB
RBC / HPF: >50 RBC/hpf — ABNORMAL HIGH (ref 0–5)
Specific Gravity, Urine: 1.012 (ref 1.005–1.030)
WBC, UA: >50 WBC/hpf — ABNORMAL HIGH (ref 0–5)
pH: 5 (ref 5.0–8.0)

## 2020-10-06 LAB — LIPASE, BLOOD: Lipase: 17 U/L (ref 11–51)

## 2020-10-06 MED ORDER — THIAMINE HCL 100 MG PO TABS: 100.0000 mg | ORAL_TABLET | Freq: Every day | ORAL | Status: DC

## 2020-10-06 MED ORDER — LORAZEPAM 1 MG PO TABS: 0.0000 mg | ORAL_TABLET | Freq: Four times a day (QID) | ORAL | Status: DC

## 2020-10-06 MED ORDER — LORAZEPAM 2 MG/ML IJ SOLN: 0.0000 mg | Freq: Two times a day (BID) | INTRAMUSCULAR | Status: DC

## 2020-10-06 MED ORDER — THIAMINE HCL 100 MG/ML IJ SOLN: 100.0000 mg | Freq: Every day | INTRAMUSCULAR | Status: DC

## 2020-10-06 MED ORDER — SODIUM CHLORIDE 0.9 % IV BOLUS
500.0000 mL | Freq: Once | INTRAVENOUS | Status: AC
  Administered 2020-10-06: 500 mL via INTRAVENOUS

## 2020-10-06 MED ORDER — SODIUM CHLORIDE 0.9 % IV SOLN
1.0000 g | Freq: Once | INTRAVENOUS | Status: AC
  Administered 2020-10-06: 1 g via INTRAVENOUS
  Filled 2020-10-06: qty 10

## 2020-10-06 MED ORDER — ONDANSETRON 4 MG PO TBDP: 4.0000 mg | ORAL_TABLET | Freq: Three times a day (TID) | ORAL | 0 refills | Status: DC | PRN

## 2020-10-06 MED ORDER — ONDANSETRON HCL 4 MG/2ML IJ SOLN
4.0000 mg | Freq: Once | INTRAMUSCULAR | Status: AC
  Administered 2020-10-06: 4 mg via INTRAVENOUS
  Filled 2020-10-06: qty 2

## 2020-10-06 MED ORDER — LORAZEPAM 2 MG/ML IJ SOLN: 0.0000 mg | Freq: Four times a day (QID) | INTRAMUSCULAR | Status: DC

## 2020-10-06 MED ORDER — CEPHALEXIN 500 MG PO CAPS: 500.0000 mg | ORAL_CAPSULE | Freq: Four times a day (QID) | ORAL | 0 refills | Status: AC

## 2020-10-06 MED ORDER — IOHEXOL 300 MG/ML  SOLN
100.0000 mL | Freq: Once | INTRAMUSCULAR | Status: AC | PRN
  Administered 2020-10-06: 100 mL via INTRAVENOUS

## 2020-10-06 MED ORDER — LORAZEPAM 1 MG PO TABS: 0.0000 mg | ORAL_TABLET | Freq: Two times a day (BID) | ORAL | Status: DC

## 2020-10-06 NOTE — Discharge Instructions (Addendum)
It was our pleasure to provide your ER care today - we hope that you feel better.  Your covid test is positive. We have made arrangements for you to temporarily stay at the [facility]. Take covid medication (paxlovid) as prescribed - while you are taking this medication make sure not to take your atorvastatin medication.    Please follow-up with urology in the next 2-3 weeks - call office to arrange appointment.  Take antibiotic as prescribed.   Return to ER if you develop any worsening symptoms or concerns (worsening or severe pain, high fevers, persistent vomiting, etc.).

## 2020-10-06 NOTE — ED Provider Notes (Signed)
I assumed care of patient at shift change from previous team, please see their note for full H&P.  Briefly awaiting call from Dr. Retta Diones with urology given abnormal CT scan and UA.  Placed  Call to Dr. Lynden Ang with no answer.  Will attempte to page the extender on call now that is after 1700.   I spoke with Pecola Leisure the extender for urology on call who will attempt to talk with Dr. Lynden Ang [phone])   Dr. Retta Diones placed consult note, per his note  "Plan:  1.  At this point, all I think he needs from an urgent standpoint is urine culture as well as appropriate antibiotic management  2.  He does not need any intervention at this point i.e. stent placement.  Stent placement at this point would be a bad idea as he is homeless and does not have a good support system so the stent may be retained  3.  I did speak with the patient about the fact that he eventually should have percutaneous management of his left renal stone burden.  This will be an outpatient with overnight stay.  Obviously, this cannot be done with him having an acute infection, so this will eventually be scheduled down the road  4.  I will follow him during his hospitalization if he does get admitted here (from my standpoint he does not need admission) for urgent management.  I will make sure he has proper follow-up in our office."    Patient's Covid test did return positive, however he is 99-100 on room air and does not appear to need admission for Covid at this time. Orders placed for outpatient Covid treatment. I spoke with [facility] team RN Mariea Stable who stated that patient should have prescription sent to the [facility] outpatient pharmacy and they can be picked up for him in the morning and that they would be unable to get him into the Covid hotel until morning. Given that patient would most likely unable otherwise to obtain his antibiotics, and given his potential to deteriorate if he is not appropriately treated he will  be boarding in the emergency room overnight for [facility] assistance in the morning. Orders are sent for antibiotics and Zofran as needed for nausea.  Note: Portions of this report may have been transcribed using voice recognition software. Every effort was made to ensure accuracy; however, inadvertent computerized transcription errors may be present  CT ABDOMEN PELVIS W CONTRAST  Result Date: 10/06/2020 CLINICAL DATA:  Acute nonlocalized abdominal pain with nausea and vomiting today. EXAM: CT ABDOMEN AND PELVIS WITH CONTRAST TECHNIQUE: Multidetector CT imaging of the abdomen and pelvis was performed using the standard protocol following bolus administration of intravenous contrast. CONTRAST:  OMNIPAQUE IOHEXOL 300 MG/ML  SOLN COMPARISON:  CT 12/28/2019 FINDINGS: Lower chest: The previously demonstrated large bilateral pleural effusions have resolved. There is mild residual pleural thickening posteriorly. The lung bases are clear. Coronary artery atherosclerosis noted. Hepatobiliary: The liver is normal in density without suspicious focal abnormality. No evidence of gallstones, gallbladder wall thickening or biliary dilatation. Pancreas: Stable findings of chronic calcific pancreatitis with moderate dilatation of the pancreatic duct, parenchymal atrophy and multiple calcifications. No surrounding inflammatory changes. Spleen: Normal in size without focal abnormality. Adrenals/Urinary Tract: There is a stable left adrenal nodule which measures 1.9 x 1.5 cm on image 24/2. Although not well seen on the most recent CT, this is unchanged from an MRI performed 07/11/2013 and is consistent with an adenoma. The right adrenal  gland appears normal. Bilateral nephrolithiasis again noted. There is a nonobstructing 10 mm calculus in the lower pole of the right kidney which is unchanged. There is a large calculus in the left renal pelvis measuring 1.8 x 1.0 cm on image 30/2. There is associated left caliectasis and  asymmetric perinephric soft tissue stranding, suggesting that this may be partially obstructing. The proximal left ureter appears mildly dilated with mild wall thickening. No ureteral calculus or high-grade obstruction identified. The bladder is mildly distended, without focal abnormality. Stomach/Bowel: The stomach appears unremarkable for its degree of distension. No evidence of bowel wall thickening, distention or surrounding inflammatory change. The appendix appears normal. Vascular/Lymphatic: There are no enlarged abdominal or pelvic lymph nodes. Small external iliac lymph nodes bilaterally are stable. There is age advanced aortic and branch vessel atherosclerosis without evidence of large vessel occlusion or aneurysm. The portal, superior mesenteric and splenic veins are patent. Reproductive: The prostate gland appears unremarkable. Other: Mild subcutaneous edema in the right anterior abdominal wall, nonspecific. Previously demonstrated generalized anasarca has resolved. There is no ascites. Musculoskeletal: No acute or significant osseous findings. IMPRESSION: 1. Large calculus in the left renal pelvis with associated left caliectasis and asymmetric perinephric soft tissue stranding, suggesting that this may be partially obstructing. No ureteral calculus or high-grade obstruction identified. 2. Bilateral nephrolithiasis. 3. Stable findings of chronic calcific pancreatitis. 4. Stable left adrenal adenoma. 5. Aortic Atherosclerosis (ICD10-I70.0). Electronically Signed   By: Carey Bullocks M.D.   On: 10/06/2020 13:27   DG Chest Portable 1 View  Result Date: 10/06/2020 CLINICAL DATA:  Abdominal pain, nausea and vomiting EXAM: PORTABLE CHEST 1 VIEW COMPARISON:  01/14/2020 FINDINGS: The heart size and mediastinal contours are within normal limits. Both lungs are clear. The visualized skeletal structures are unremarkable. IMPRESSION: No active disease. Electronically Signed   By: Sharlet Salina M.D.   On:  10/06/2020 18:31    Labs Reviewed  RESP PANEL BY RT-PCR (FLU A&B, COVID) ARPGX2 - Abnormal; Notable for the following components:      Result Value   SARS Coronavirus 2 by RT PCR POSITIVE (*)    All other components within normal limits  CBC WITH DIFFERENTIAL/PLATELET - Abnormal; Notable for the following components:   WBC 10.7 (*)    RBC 3.30 (*)    Hemoglobin 9.7 (*)    HCT 30.2 (*)    Neutro Abs 8.7 (*)    All other components within normal limits  COMPREHENSIVE METABOLIC PANEL - Abnormal; Notable for the following components:   CO2 20 (*)    Glucose, Bld 154 (*)    BUN 38 (*)    All other components within normal limits  URINALYSIS, ROUTINE W REFLEX MICROSCOPIC - Abnormal; Notable for the following components:   APPearance HAZY (*)    Hgb urine dipstick LARGE (*)    Protein, ur 100 (*)    Nitrite POSITIVE (*)    Leukocytes,Ua LARGE (*)    RBC / HPF >50 (*)    WBC, UA >50 (*)    All other components within normal limits  CULTURE, BLOOD (ROUTINE X 2)  CULTURE, BLOOD (ROUTINE X 2)  URINE CULTURE  LIPASE, BLOOD  RAPID URINE DRUG SCREEN, HOSP PERFORMED    Medications  LORazepam (ATIVAN) injection 0-4 mg (has no administration in time range)    Or  LORazepam (ATIVAN) tablet 0-4 mg (has no administration in time range)  LORazepam (ATIVAN) injection 0-4 mg (has no administration in time range)  Or  LORazepam (ATIVAN) tablet 0-4 mg (has no administration in time range)  thiamine tablet 100 mg (has no administration in time range)    Or  thiamine (B-1) injection 100 mg (has no administration in time range)  ondansetron (ZOFRAN) injection 4 mg (4 mg Intravenous Given 10/06/20 1119)  iohexol (OMNIPAQUE) 300 MG/ML solution 100 mL (100 mLs Intravenous Contrast Given 10/06/20 1227)  cefTRIAXone (ROCEPHIN) 1 g in sodium chloride 0.9 % 100 mL IVPB (0 g Intravenous Stopped 10/06/20 1452)  sodium chloride 0.9 % bolus 500 mL (0 mLs Intravenous Stopped 10/06/20 2000)      Cristina Gong, PA-C 10/07/20 0008    Alvira Monday, MD 10/08/20 1705

## 2020-10-06 NOTE — Progress Notes (Signed)
..   Transition of Care [facility]) - Emergency Department Mini Assessment   Patient Details  Name: Rodney Barber MRN: [id number] Date of Birth: February 26, 1966  Transition of [facility] CM/SW Contact:    Elliot Cousin, RN Phone Number: [phone] 10/06/2020, 9:34 PM   Clinical Narrative:  TOC CM attempted call to pt to discuss COVID and get verbal consent to send referral to [facility]. Will follow up in am for vebal consent. Pt will need all his meds prior to going to COVID hotel.   ED Mini Assessment: What brought you to the Emergency Department? : abdominal pain, nausea, vomiting  Barriers to Discharge: ED Medication assistance,Other (comment)        Interventions which prevented an admission or readmission: Homeless Screening,Medication Review    Patient Contact and Communications        ,                 Admission diagnosis:  abdominal pain Patient Active Problem List   Diagnosis Date Noted  . Hypotension due to hypovolemia 06/01/2020  . Coronary artery disease 04/20/2020  . S/P thoracentesis   . Chronic combined systolic (congestive) and diastolic (congestive) heart failure (HCC)   . Essential hypertension   . Hypoglycemia 11/14/2019  . Tobacco abuse 05/14/2019  . Substance use disorder 02/18/2019  . Uncontrolled type 2 diabetes mellitus (HCC) 03/15/2018  . Hyperlipidemia associated with type 2 diabetes mellitus (HCC) 09/08/2013  . 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 Severe malnutrition (HCC) 07/11/2013  . Protein-calorie malnutrition, severe (HCC) 07/11/2013   PCP:  [name], MD Pharmacy:   [facility] [place], [place] - [address] [place] [address] Phone: [phone] Fax: [phone]  [facility] - [place], [place] - [address] [facility] [address] [place] [address] Phone: [phone] Fax: [phone]

## 2020-10-06 NOTE — ED Provider Notes (Signed)
[facility] DEPT Provider Note   CSN: [id number] Arrival date & time: 10/06/20  4580     History Chief Complaint  Patient presents with  . Abdominal Pain  . Emesis    Rodney Barber is a 55 y.o. male with PMH of HTN, HLD, insulin-dependent type 2 DM, polysubstance abuse, and pancreatitis who presents to the ED via EMS from [facility] with complaints of abdominal pain, nausea, and emesis.  On my examination, patient reports that last night at approximately 8 PM he developed periumbilical abdominal pain.  States that he rates it a 5 out of 10 and that it kept him up for most of the night.  He also has been experiencing nausea and dry heaving.  He had a few episodes of bilious emesis.  He denies having had similar episodes in the past.  Denies any history of abdominal surgeries.  Patient is resting rather comfortably on my examination and is hiding underneath a sheet.  He states that is because he feels fatigued and did not sleep well last evening due to his pain symptoms.  Patient denies recent illness or infection, fevers or chills, chest pain or shortness of breath, dysuria, penile discharge, testicular pain or swelling, pain with defecation, rash, or other symptoms.  He also denies any recent illicit drug use or alcohol.  He has not taken anything for his pain symptoms and does not have good outpatient follow-up.    HPI     Past Medical History:  Diagnosis Date  . Diabetes mellitus   . Hypertension   . Pancreatitis     Patient Active Problem List   Diagnosis Date Noted  . Hypotension due to hypovolemia 06/01/2020  . Coronary artery disease 04/20/2020  . S/P thoracentesis   . Chronic combined systolic (congestive) and diastolic (congestive) heart failure ([place])   . Essential hypertension   . Hypoglycemia 11/14/2019  . Tobacco abuse 05/14/2019  . Substance use disorder 02/18/2019  . Uncontrolled type 2 diabetes mellitus  ([place]) 03/15/2018  . Hyperlipidemia associated with type 2 diabetes mellitus ([place]) 09/08/2013  . Alcohol-induced chronic pancreatitis ([place]) 07/22/2013  . Severe malnutrition ([place]) 07/11/2013  . Protein-calorie malnutrition, severe ([place]) 07/11/2013    Past Surgical History:  Procedure Laterality Date  . ERCP  06/27/2011   Procedure: ENDOSCOPIC RETROGRADE CHOLANGIOPANCREATOGRAPHY (ERCP);  Surgeon: Jeryl Columbia, MD;  Location: [facility];  Service: Endoscopy;  Laterality: N/A;  . ERCP W/ PLASTIC STENT PLACEMENT  03/2011  . IR US GUIDE BX ASP/DRAIN  05/16/2019  . LEFT HEART CATH AND CORONARY ANGIOGRAPHY N/A 01/02/2020   Procedure: LEFT HEART CATH AND CORONARY ANGIOGRAPHY;  Surgeon: Martinique, Peter M, MD;  Location: [facility];  Service: Cardiovascular;  Laterality: N/A;  . TEE WITHOUT CARDIOVERSION N/A 05/20/2019   Procedure: TRANSESOPHAGEAL ECHOCARDIOGRAM (TEE);  Surgeon: [name], MD;  Location: [facility] END[facility];  Service: Cardiovascular;  Laterality: N/A;       Family History  Problem Relation Age of Onset  . Diabetes Mother   . Diabetes Brother     Social History   Tobacco Use  . Smoking status: Current Every Day Smoker    Packs/day: 0.50    Years: 30.00    Pack years: 15.00    Types: Cigarettes  . Smokeless tobacco: Never Used  . Tobacco comment: 6 cigarett /day  Vaping Use  . Vaping Use: Never used  Substance Use Topics  . Alcohol use: No  . Drug use: Not  Currently    Types: "Crack" cocaine, Other-see comments, Cocaine    Comment: last smoked crack 12-16-16    Home Medications Prior to Admission medications   Medication Sig Start Date End Date Taking? Authorizing Provider  aspirin 81 MG chewable tablet Chew 1 tablet (81 mg total) by mouth daily. 07/07/20  Yes Elsie Stain, MD  atorvastatin (LIPITOR) 80 MG tablet Take 1 tablet (80 mg total) by mouth daily at 6 PM. 09/15/20 10/15/20 Yes Elsie Stain, MD  carvedilol (COREG) 25 MG tablet Take 1 tablet (25  mg total) by mouth 2 (two) times daily with a meal. 09/15/20  Yes Elsie Stain, MD  Ferrous Sulfate Dried (FEOSOL) 200 (65 Fe) MG TABS Take 1 tablet by mouth 2 (two) times daily with a meal. Patient taking differently: Take 200 mg by mouth 2 (two) times daily with a meal. 09/29/20  Yes Elsie Stain, MD  Glucose 4-6 GM-MG CHEW Chew one as needed for blood glucose < 70 Patient taking differently: Chew 1 tablet by mouth 2 (two) times daily as needed (low glucose). Chew one as needed for blood glucose < 70 05/06/20  Yes Elsie Stain, MD  ibuprofen (ADVIL) 200 MG tablet Take 400 mg by mouth every 6 (six) hours as needed for fever, headache or mild pain.   Yes [provider]  insulin detemir (LEVEMIR FLEXTOUCH) 100 UNIT/ML FlexPen 30  units AM and 30 units PM Patient taking differently: Inject 30 Units into the skin 2 (two) times daily. 07/07/20  Yes Elsie Stain, MD  insulin lispro (HUMALOG) 100 UNIT/ML KwikPen Inject 8 Units into the skin 2 (two) times daily with a meal. 09/29/20  Yes Elsie Stain, MD  losartan (COZAAR) 100 MG tablet Take 1 tablet (100 mg total) by mouth daily. 09/15/20  Yes Elsie Stain, MD  Pancrelipase, Lip-Prot-Amyl, (CREON) 6000-19000 units CPEP Take 1 capsule (6,000 Units total) by mouth in the morning, at noon, and at bedtime. 08/25/20 10/24/20 Yes Elsie Stain, MD  pantoprazole (PROTONIX) 40 MG tablet Take 1 tablet (40 mg total) by mouth daily. 09/15/20  Yes Elsie Stain, MD  spironolactone (ALDACTONE) 25 MG tablet Take 1 tablet (25 mg total) by mouth daily. 09/15/20  Yes Elsie Stain, MD  torsemide (DEMADEX) 20 MG tablet Take 1 tablet (20 mg total) by mouth daily. 09/15/20  Yes Elsie Stain, MD  blood glucose meter kit and supplies KIT Dispense based on patient and insurance preference. Use up to four times daily as directed. (FOR ICD-9 250.00, 250.01). 11/15/19   Little Ishikawa, MD  feeding supplement, GLUCERNA SHAKE, (GLUCERNA  SHAKE) LIQD Take 237 mLs by mouth 3 (three) times daily between meals. Patient not taking: No sig reported 09/11/19   Mariel Aloe, MD  Insulin Pen Needle 29G X 5MM MISC Use with insulin pens 09/29/20   Elsie Stain, MD  sulfamethoxazole-trimethoprim (BACTRIM DS) 800-160 MG tablet Take 1 tablet by mouth 2 (two) times daily. Patient not taking: Reported on 10/06/2020 09/01/20   Elsie Stain, MD    Allergies    Patient has no known allergies.  Review of Systems   Review of Systems  All other systems reviewed and are negative.   Physical Exam Updated Vital Signs BP (!) 146/66   Pulse 72   Temp 97.6 F (36.4 C) (Axillary)   Resp 18   SpO2 97%   Physical Exam Vitals and nursing note reviewed. Exam conducted with  a chaperone present.  Constitutional:      General: He is not in acute distress. HENT:     Head: Normocephalic and atraumatic.  Eyes:     General: No scleral icterus.    Conjunctiva/sclera: Conjunctivae normal.  Cardiovascular:     Rate and Rhythm: Normal rate.     Pulses: Normal pulses.  Pulmonary:     Effort: Pulmonary effort is normal.  Abdominal:     General: Abdomen is flat. There is no distension.     Palpations: Abdomen is soft.     Tenderness: There is abdominal tenderness. There is no right CVA tenderness, left CVA tenderness or guarding.     Comments: Soft, nondistended.  Mild periumbilical TTP.  No significant tenderness elsewhere.  Negative Murphy sign.  Negative McBurney's point tenderness.  No overlying skin changes.  Musculoskeletal:     Cervical back: Normal range of motion. No rigidity.     Right lower leg: No edema.     Left lower leg: No edema.  Skin:    General: Skin is dry.  Neurological:     Mental Status: He is alert and oriented to person, place, and time.     GCS: GCS eye subscore is 4. GCS verbal subscore is 5. GCS motor subscore is 6.  Psychiatric:        Mood and Affect: Mood normal.        Behavior: Behavior normal.         Thought Content: Thought content normal.     ED Results / Procedures / Treatments   Labs (all labs ordered are listed, but only abnormal results are displayed) Labs Reviewed  CBC WITH DIFFERENTIAL/PLATELET - Abnormal; Notable for the following components:      Result Value   WBC 10.7 (*)    RBC 3.30 (*)    Hemoglobin 9.7 (*)    HCT 30.2 (*)    Neutro Abs 8.7 (*)    All other components within normal limits  COMPREHENSIVE METABOLIC PANEL - Abnormal; Notable for the following components:   CO2 20 (*)    Glucose, Bld 154 (*)    BUN 38 (*)    All other components within normal limits  URINALYSIS, ROUTINE W REFLEX MICROSCOPIC - Abnormal; Notable for the following components:   APPearance HAZY (*)    Hgb urine dipstick LARGE (*)    Protein, ur 100 (*)    Nitrite POSITIVE (*)    Leukocytes,Ua LARGE (*)    RBC / HPF >50 (*)    WBC, UA >50 (*)    All other components within normal limits  RESP PANEL BY RT-PCR (FLU A&B, COVID) [id number]  URINE CULTURE  LIPASE, BLOOD  RAPID URINE DRUG SCREEN, HOSP PERFORMED    EKG None  Radiology CT ABDOMEN PELVIS W CONTRAST  Result Date: 10/06/2020 CLINICAL DATA:  Acute nonlocalized abdominal pain with nausea and vomiting today. EXAM: CT ABDOMEN AND PELVIS WITH CONTRAST TECHNIQUE: Multidetector CT imaging of the abdomen and pelvis was performed using the standard protocol following bolus administration of intravenous contrast. CONTRAST:  163m OMNIPAQUE IOHEXOL 300 MG/ML  SOLN COMPARISON:  CT 12/28/2019 FINDINGS: Lower chest: The previously demonstrated large bilateral pleural effusions have resolved. There is mild residual pleural thickening posteriorly. The lung bases are clear. Coronary artery atherosclerosis noted. Hepatobiliary: The liver is normal in density without suspicious focal abnormality. No evidence of gallstones, gallbladder wall thickening or biliary dilatation. Pancreas: Stable findings of chronic calcific pancreatitis with moderate  dilatation  of the pancreatic duct, parenchymal atrophy and multiple calcifications. No surrounding inflammatory changes. Spleen: Normal in size without focal abnormality. Adrenals/Urinary Tract: There is a stable left adrenal nodule which measures 1.9 x 1.5 cm on image 24/2. Although not well seen on the most recent CT, this is unchanged from an MRI performed 07/11/2013 and is consistent with an adenoma. The right adrenal gland appears normal. Bilateral nephrolithiasis again noted. There is a nonobstructing 10 mm calculus in the lower pole of the right kidney which is unchanged. There is a large calculus in the left renal pelvis measuring 1.8 x 1.0 cm on image 30/2. There is associated left caliectasis and asymmetric perinephric soft tissue stranding, suggesting that this may be partially obstructing. The proximal left ureter appears mildly dilated with mild wall thickening. No ureteral calculus or high-grade obstruction identified. The bladder is mildly distended, without focal abnormality. Stomach/Bowel: The stomach appears unremarkable for its degree of distension. No evidence of bowel wall thickening, distention or surrounding inflammatory change. The appendix appears normal. Vascular/Lymphatic: There are no enlarged abdominal or pelvic lymph nodes. Small external iliac lymph nodes bilaterally are stable. There is age advanced aortic and branch vessel atherosclerosis without evidence of large vessel occlusion or aneurysm. The portal, superior mesenteric and splenic veins are patent. Reproductive: The prostate gland appears unremarkable. Other: Mild subcutaneous edema in the right anterior abdominal wall, nonspecific. Previously demonstrated generalized anasarca has resolved. There is no ascites. Musculoskeletal: No acute or significant osseous findings. IMPRESSION: 1. Large calculus in the left renal pelvis with associated left caliectasis and asymmetric perinephric soft tissue stranding, suggesting that this  may be partially obstructing. No ureteral calculus or high-grade obstruction identified. 2. Bilateral nephrolithiasis. 3. Stable findings of chronic calcific pancreatitis. 4. Stable left adrenal adenoma. 5. Aortic Atherosclerosis (ICD10-I70.0). Electronically Signed   By: Richardean Sale M.D.   On: 10/06/2020 13:27    Procedures Procedures   Medications Ordered in ED Medications  ondansetron (ZOFRAN) injection 4 mg (4 mg Intravenous Given 10/06/20 1119)  iohexol (OMNIPAQUE) 300 MG/ML solution 100 mL (100 mLs Intravenous Contrast Given 10/06/20 1227)  cefTRIAXone (ROCEPHIN) 1 g in sodium chloride 0.9 % 100 mL IVPB (1 g Intravenous New Bag/Given 10/06/20 1422)    ED Course  I have reviewed the triage vital signs and the nursing notes.  Pertinent labs & imaging results that were available during my care of the patient were reviewed by me and considered in my medical decision making (see chart for details).    MDM Rules/Calculators/A&P                          Rodney Barber was evaluated in Emergency Department on 10/06/2020 for the symptoms described in the history of present illness. He was evaluated in the context of the global COVID-19 pandemic, which necessitated consideration that the patient might be at risk for infection with the SARS-CoV-2 virus that causes COVID-19. Institutional protocols and algorithms that pertain to the evaluation of patients at risk for COVID-19 are in a state of rapid change based on information released by regulatory bodies including the CDC and federal and state organizations. These policies and algorithms were followed during the patient's care in the ED.  I personally reviewed patient's medical chart and all notes from triage and staff during today's encounter. I have also ordered and reviewed all labs and imaging that I felt to be medically necessary in the evaluation of this patient's complaints and with consideration  of their physical exam. If needed,  translation services were available and utilized.   Patient presented to the ED with nonspecific abdominal pain x1 day.  He is insulin-dependent type 2 DM.  CBG per EMS was normal, glucose normal on CMP here in the ED.  Lipase is within normal limits.  CBC with differential, lipase, CMP, and rapid urine drug screen all reviewed and unremarkable.  He stays at [facility] and endorses poor outpatient follow-up.  All his abdominal exam is largely benign, given his leukocytosis and reports of acute onset pain and nausea in addition to his poor follow-up, obtained CT abdomen and pelvis which demonstrates a large calculus in the left renal pelvis with associated left calyectasis and asymmetric perinephric stranding, suggesting possible partial obstruction.  The UA also resulted and with evidence concerning for urinary tract infection with >50 WBC, nitrite positive, and large RBC.    We will start patient on Rocephin and consult urology for possible admission.  On my subsequent evaluation, patient actually is feeling improved when compared to my first assessment.  Given his lack of severe pain, we had decided to hold off on analgesic management, however given the concern for partial obstruction his symptoms of pain can be waxing and waning.    Given patient's findings on UA and CT abdomen pelvis, he needs urology consult.  I have had consult pending for couple of hours, no return call.  Patient will have to be handed off to Wyn Quaker PA who will try to connect with urology.  Patient does not have adequate outpatient follow-up.  Final Clinical Impression(s) / ED Diagnoses Final diagnoses:  Generalized abdominal pain    Rx / DC Orders ED Discharge Orders    None       Corena Herter, PA-C 10/06/20 1633    Lacretia Leigh, MD 10/13/20 1026

## 2020-10-06 NOTE — ED Triage Notes (Signed)
BIBA Per EMS: Pt coming from [facility] with complaints of abdominal pain and N/V. Pt experienced one episode of vomiting this morning that was green in color. Pt states he has pain that is 6/10. A&Ox4  Vitals WDL: 142/88 76 HR  99% RA 751 CBG

## 2020-10-06 NOTE — Consult Note (Signed)
H&P  Consulting MD: Lorre Nick, MD  Chief Complaint: Kidney stone  History of Present Illness: 55 year old homeless individual presented to the emergency room earlier today with abdominal discomfort, nausea, vomiting.  He was found to be Covid positive.  He does have a history of pancreatitis, diabetes mellitus, coronary artery disease, CHF, hypertension, hyperlipidemia, malnutrition.  In the emergency room he was found to have infected appearing urine, and with his abdominal pain had CT abdomen and pelvis.  This revealed a 10 mm right renal calculus and an 18 mm left renal calculus in the pyelocalyceal system.  There was mild hydronephrosis.  The patient is not febrile, and has a minimal leukocytosis.  Urologic consultation is requested for further management.  Past Medical History:  Diagnosis Date  . Diabetes mellitus   . Hypertension   . Pancreatitis     Past Surgical History:  Procedure Laterality Date  . ERCP  06/27/2011   Procedure: ENDOSCOPIC RETROGRADE CHOLANGIOPANCREATOGRAPHY (ERCP);  Surgeon: Petra Kuba, MD;  Location: [facility];  Service: Endoscopy;  Laterality: N/A;  . ERCP W/ PLASTIC STENT PLACEMENT  03/2011  . IR US GUIDE BX ASP/DRAIN  05/16/2019  . LEFT HEART CATH AND CORONARY ANGIOGRAPHY N/A 01/02/2020   Procedure: LEFT HEART CATH AND CORONARY ANGIOGRAPHY;  Surgeon: Swaziland, Peter M, MD;  Location: [facility] INVASIVE CV LAB;  Service: Cardiovascular;  Laterality: N/A;  . TEE WITHOUT CARDIOVERSION N/A 05/20/2019   Procedure: TRANSESOPHAGEAL ECHOCARDIOGRAM (TEE);  Surgeon: Chrystie Nose, MD;  Location: [facility] ENDOSCOPY;  Service: Cardiovascular;  Laterality: N/A;    Home Medications:    Allergies: No Known Allergies  Family History  Problem Relation Age of Onset  . Diabetes Mother   . Diabetes Brother     Social History:  reports that he has been smoking cigarettes. He has a 15.00 pack-year smoking history. He has never used smokeless tobacco. He reports previous drug  use. Drugs: "Crack" cocaine, Other-see comments, and Cocaine. He reports that he does not drink alcohol.  ROS: A complete review of systems was performed.  All systems are negative except for pertinent findings as noted.  Physical Exam:  Vital signs in last 24 hours: BP (!) 155/75   Pulse 73   Temp 97.6 F (36.4 C) (Axillary)   Resp 18   SpO2 100%  Constitutional:  Alert and oriented, No acute distress Cardiovascular: Regular rate  Respiratory: Normal respiratory effort GI: Abdomen is soft, nontender, nondistended, no abdominal masses. No CVAT.  Genitourinary: Normal male phallus, testes are descended bilaterally and non-tender and without masses, scrotum is normal in appearance without lesions or masses, perineum is normal on inspection. Lymphatic: No lymphadenopathy Neurologic: Grossly intact, no focal deficits Psychiatric: Normal mood and affect  Laboratory Data:  Recent Labs    10/06/20 0949  WBC 10.7*  HGB 9.7*  HCT 30.2*  PLT 202    Recent Labs    10/06/20 0949  NA 137  K 3.9  CL 106  GLUCOSE 154*  BUN 38*  CALCIUM 9.3  CREATININE 1.20     Results for orders placed or performed during the hospital encounter of 10/06/20 (from the past 24 hour(s))  CBC with Differential     Status: Abnormal   Collection Time: 10/06/20  9:49 AM  Result Value Ref Range   WBC 10.7 (H) 4.0 - 10.5 K/uL   RBC 3.30 (L) 4.22 - 5.81 MIL/uL   Hemoglobin 9.7 (L) 13.0 - 17.0 g/dL   HCT 80.9 (L) 98.3 - 38.2 %  MCV 91.5 80.0 - 100.0 fL   MCH 29.4 26.0 - 34.0 pg   MCHC 32.1 30.0 - 36.0 g/dL   RDW 37.6 28.3 - 15.1 %   Platelets 202 150 - 400 K/uL   nRBC 0.0 0.0 - 0.2 %   Neutrophils Relative % 81 %   Neutro Abs 8.7 (H) 1.7 - 7.7 K/uL   Lymphocytes Relative 11 %   Lymphs Abs 1.2 0.7 - 4.0 K/uL   Monocytes Relative 4 %   Monocytes Absolute 0.4 0.1 - 1.0 K/uL   Eosinophils Relative 3 %   Eosinophils Absolute 0.3 0.0 - 0.5 K/uL   Basophils Relative 1 %   Basophils Absolute 0.1 0.0  - 0.1 K/uL   Immature Granulocytes 0 %   Abs Immature Granulocytes 0.03 0.00 - 0.07 K/uL  Comprehensive metabolic panel     Status: Abnormal   Collection Time: 10/06/20  9:49 AM  Result Value Ref Range   Sodium 137 135 - 145 mmol/L   Potassium 3.9 3.5 - 5.1 mmol/L   Chloride 106 98 - 111 mmol/L   CO2 20 (L) 22 - 32 mmol/L   Glucose, Bld 154 (H) 70 - 99 mg/dL   BUN 38 (H) 6 - 20 mg/dL   Creatinine, Ser 7.61 0.61 - 1.24 mg/dL   Calcium 9.3 8.9 - 60.7 mg/dL   Total Protein 7.5 6.5 - 8.1 g/dL   Albumin 4.2 3.5 - 5.0 g/dL   AST 21 15 - 41 U/L   ALT 18 0 - 44 U/L   Alkaline Phosphatase 81 38 - 126 U/L   Total Bilirubin 0.8 0.3 - 1.2 mg/dL   GFR, Estimated >37 >10 mL/min   Anion gap 11 5 - 15  Lipase, blood     Status: None   Collection Time: 10/06/20  9:49 AM  Result Value Ref Range   Lipase 17 11 - 51 U/L  Urinalysis, Routine w reflex microscopic Urine, Clean Catch     Status: Abnormal   Collection Time: 10/06/20 12:42 PM  Result Value Ref Range   Color, Urine YELLOW YELLOW   APPearance HAZY (A) CLEAR   Specific Gravity, Urine 1.012 1.005 - 1.030   pH 5.0 5.0 - 8.0   Glucose, UA NEGATIVE NEGATIVE mg/dL   Hgb urine dipstick LARGE (A) NEGATIVE   Bilirubin Urine NEGATIVE NEGATIVE   Ketones, ur NEGATIVE NEGATIVE mg/dL   Protein, ur 626 (A) NEGATIVE mg/dL   Nitrite POSITIVE (A) NEGATIVE   Leukocytes,Ua LARGE (A) NEGATIVE   RBC / HPF >50 (H) 0 - 5 RBC/hpf   WBC, UA >50 (H) 0 - 5 WBC/hpf   Bacteria, UA NONE SEEN NONE SEEN   WBC Clumps PRESENT   Rapid urine drug screen (hospital performed)     Status: None   Collection Time: 10/06/20 12:42 PM  Result Value Ref Range   Opiates NONE DETECTED NONE DETECTED   Cocaine NONE DETECTED NONE DETECTED   Benzodiazepines NONE DETECTED NONE DETECTED   Amphetamines NONE DETECTED NONE DETECTED   Tetrahydrocannabinol NONE DETECTED NONE DETECTED   Barbiturates NONE DETECTED NONE DETECTED  Resp Panel by RT-PCR (Flu A&B, Covid) Nasopharyngeal  Swab     Status: Abnormal   Collection Time: 10/06/20  1:57 PM   Specimen: Nasopharyngeal Swab; Nasopharyngeal(NP) swabs in vial transport medium  Result Value Ref Range   SARS Coronavirus 2 by RT PCR POSITIVE (A) NEGATIVE   Influenza A by PCR NEGATIVE NEGATIVE   Influenza B by PCR NEGATIVE  NEGATIVE   Recent Results (from the past 240 hour(s))  Resp Panel by RT-PCR (Flu A&B, Covid) Nasopharyngeal Swab     Status: Abnormal   Collection Time: 10/06/20  1:57 PM   Specimen: Nasopharyngeal Swab; Nasopharyngeal(NP) swabs in vial transport medium  Result Value Ref Range Status   SARS Coronavirus 2 by RT PCR POSITIVE (A) NEGATIVE Final    Comment: RESULT CALLED TO, READ BACK BY AND VERIFIED WITH: S.WEST AT 1711 ON 03.02.22 BY N.THOMPSON (NOTE) SARS-CoV-2 target nucleic acids are DETECTED.  The SARS-CoV-2 RNA is generally detectable in upper respiratory specimens during the acute phase of infection. Positive results are indicative of the presence of the identified virus, but do not rule out bacterial infection or co-infection with other pathogens not detected by the test. Clinical correlation with patient history and other diagnostic information is necessary to determine patient infection status. The expected result is Negative.  Fact Sheet for Patients: BloggerCourse.com  Fact Sheet for Healthcare Providers: SeriousBroker.it  This test is not yet approved or cleared by the [place] FDA and  has been authorized for detection and/or diagnosis of SARS-CoV-2 by FDA under an Emergency Use Authorization (EUA).  This EUA will remain in effect (meaning this test  can be used) for the duration of  the COVID-19 declaration under Section 564(b)(1) of the Act, 21 U.S.C. section 360bbb-3(b)(1), unless the authorization is terminated or revoked sooner.     Influenza A by PCR NEGATIVE NEGATIVE Final   Influenza B by PCR NEGATIVE NEGATIVE Final     Comment: (NOTE) The Xpert Xpress SARS-CoV-2/FLU/RSV plus assay is intended as an aid in the diagnosis of influenza from Nasopharyngeal swab specimens and should not be used as a sole basis for treatment. Nasal washings and aspirates are unacceptable for Xpert Xpress SARS-CoV-2/FLU/RSV testing.  Fact Sheet for Patients: BloggerCourse.com  Fact Sheet for Healthcare Providers: SeriousBroker.it  This test is not yet approved or cleared by the [place] FDA and has been authorized for detection and/or diagnosis of SARS-CoV-2 by FDA under an Emergency Use Authorization (EUA). This EUA will remain in effect (meaning this test can be used) for the duration of the COVID-19 declaration under Section 564(b)(1) of the Act, 21 U.S.C. section 360bbb-3(b)(1), unless the authorization is terminated or revoked.  Performed at [facility], [address] [place], [place] [address]     Renal Function: Recent Labs    10/06/20 0949  CREATININE 1.20   CrCl cannot be calculated (Unknown ideal weight.).  Radiologic Imaging: CT ABDOMEN PELVIS W CONTRAST  Result Date: 10/06/2020 CLINICAL DATA:  Acute nonlocalized abdominal pain with nausea and vomiting today. EXAM: CT ABDOMEN AND PELVIS WITH CONTRAST TECHNIQUE: Multidetector CT imaging of the abdomen and pelvis was performed using the standard protocol following bolus administration of intravenous contrast. CONTRAST:  OMNIPAQUE IOHEXOL 300 MG/ML  SOLN COMPARISON:  CT 12/28/2019 FINDINGS: Lower chest: The previously demonstrated large bilateral pleural effusions have resolved. There is mild residual pleural thickening posteriorly. The lung bases are clear. Coronary artery atherosclerosis noted. Hepatobiliary: The liver is normal in density without suspicious focal abnormality. No evidence of gallstones, gallbladder wall thickening or biliary dilatation. Pancreas: Stable  findings of chronic calcific pancreatitis with moderate dilatation of the pancreatic duct, parenchymal atrophy and multiple calcifications. No surrounding inflammatory changes. Spleen: Normal in size without focal abnormality. Adrenals/Urinary Tract: There is a stable left adrenal nodule which measures 1.9 x 1.5 cm on image 24/2. Although not well seen on the most  recent CT, this is unchanged from an MRI performed 07/11/2013 and is consistent with an adenoma. The right adrenal gland appears normal. Bilateral nephrolithiasis again noted. There is a nonobstructing 10 mm calculus in the lower pole of the right kidney which is unchanged. There is a large calculus in the left renal pelvis measuring 1.8 x 1.0 cm on image 30/2. There is associated left caliectasis and asymmetric perinephric soft tissue stranding, suggesting that this may be partially obstructing. The proximal left ureter appears mildly dilated with mild wall thickening. No ureteral calculus or high-grade obstruction identified. The bladder is mildly distended, without focal abnormality. Stomach/Bowel: The stomach appears unremarkable for its degree of distension. No evidence of bowel wall thickening, distention or surrounding inflammatory change. The appendix appears normal. Vascular/Lymphatic: There are no enlarged abdominal or pelvic lymph nodes. Small external iliac lymph nodes bilaterally are stable. There is age advanced aortic and branch vessel atherosclerosis without evidence of large vessel occlusion or aneurysm. The portal, superior mesenteric and splenic veins are patent. Reproductive: The prostate gland appears unremarkable. Other: Mild subcutaneous edema in the right anterior abdominal wall, nonspecific. Previously demonstrated generalized anasarca has resolved. There is no ascites. Musculoskeletal: No acute or significant osseous findings. IMPRESSION: 1. Large calculus in the left renal pelvis with associated left caliectasis and asymmetric  perinephric soft tissue stranding, suggesting that this may be partially obstructing. No ureteral calculus or high-grade obstruction identified. 2. Bilateral nephrolithiasis. 3. Stable findings of chronic calcific pancreatitis. 4. Stable left adrenal adenoma. 5. Aortic Atherosclerosis (ICD10-I70.0). Electronically Signed   By: Carey Bullocks M.D.   On: 10/06/2020 13:27   DG Chest Portable 1 View  Result Date: 10/06/2020 CLINICAL DATA:  Abdominal pain, nausea and vomiting EXAM: PORTABLE CHEST 1 VIEW COMPARISON:  01/14/2020 FINDINGS: The heart size and mediastinal contours are within normal limits. Both lungs are clear. The visualized skeletal structures are unremarkable. IMPRESSION: No active disease. Electronically Signed   By: Sharlet Salina M.D.   On: 10/06/2020 18:31   I independently reviewed the patient's CT images as well as appropriate laboratories  Impression/Assessment:  Bilateral renal calculi, burden greater on the left than right.  Currently, there does not seem to be any obstructive process that needs urgent management.  He does have an associated urinary tract infection without sepsis.  He is Covid positive.  According the patient, he has no history of previous urinary tract infections or urolithiasis.  Plan:  1.  At this point, all I think he needs from an urgent standpoint is urine culture as well as appropriate antibiotic management  2.  He does not need any intervention at this point i.e. stent placement.  Stent placement at this point would be a bad idea as he is homeless and does not have a good support system so the stent may be retained  3.  I did speak with the patient about the fact that he eventually should have percutaneous management of his left renal stone burden.  This will be an outpatient with overnight stay.  Obviously, this cannot be done with him having an acute infection, so this will eventually be scheduled down the road  4.  I will follow him during his  hospitalization if he does get admitted here (from my standpoint he does not need admission) for urgent management.  I will make sure he has proper follow-up in our office.

## 2020-10-06 NOTE — ED Notes (Signed)
Pt. Documented in error see above note in chart. 

## 2020-10-07 ENCOUNTER — Other Ambulatory Visit (HOSPITAL_COMMUNITY): Payer: Self-pay | Admitting: Physician Assistant

## 2020-10-07 ENCOUNTER — Other Ambulatory Visit: Payer: Self-pay | Admitting: Physician Assistant

## 2020-10-07 DIAGNOSIS — U071 COVID-19: Secondary | ICD-10-CM

## 2020-10-07 MED ORDER — NIRMATRELVIR/RITONAVIR (PAXLOVID)TABLET: 3.0000 | ORAL_TABLET | Freq: Two times a day (BID) | ORAL | 0 refills | Status: AC

## 2020-10-07 MED FILL — PAXLOVID 20 X 150 MG & 10 X: 20 X 150 MG | 5 days supply | Qty: 30 | Fill #0

## 2020-10-07 MED FILL — ONDANSETRON ODT 4 MG TABLET: 4 | 3 days supply | Qty: 10 | Fill #0

## 2020-10-07 MED FILL — CEPHALEXIN 500 MG CAPSULE: 500 | 10 days supply | Qty: 40 | Fill #0

## 2020-10-07 NOTE — Progress Notes (Signed)
Outpatient Oral COVID Treatment Note  I connected with Rodney Barber on 10/07/2020/2:27 PM by telephone and verified that I am speaking with the correct person using two identifiers.  I discussed the limitations, risks, security, and privacy concerns of performing an evaluation and management service by telephone and the availability of in person appointments. I also discussed with the patient that there may be a patient responsible charge related to this service. The patient expressed understanding and agreed to proceed.  Patient location: [facility] ED Provider location: office   Diagnosis: COVID-19 infection  Purpose of visit: Discussion of potential use of Molnupiravir or Paxlovid, a new treatment for mild to moderate COVID-19 viral infection in non-hospitalized patients.   Subjective: Patient is a 55 y.o. male who has been diagnosed with COVID 19 viral infection.  Their symptoms began on 2/28 with belly pain and fatigue.    Past Medical History:  Diagnosis Date  . Diabetes mellitus   . Hypertension   . Pancreatitis     No Known Allergies   Current Facility-Administered Medications:  .  Insta-Glucose 77.4 % GEL 1 Tube, 1 Tube, Oral, Once, Elsie Stain, MD  Current Outpatient Medications:  .  aspirin 81 MG chewable tablet, Chew 1 tablet (81 mg total) by mouth daily., Disp: 120 tablet, Rfl: 0 .  atorvastatin (LIPITOR) 80 MG tablet, Take 1 tablet (80 mg total) by mouth daily at 6 PM., Disp: 30 tablet, Rfl: 3 .  blood glucose meter kit and supplies KIT, Dispense based on patient and insurance preference. Use up to four times daily as directed. (FOR ICD-9 250.00, 250.01)., Disp: 1 each, Rfl: 0 .  carvedilol (COREG) 25 MG tablet, Take 1 tablet (25 mg total) by mouth 2 (two) times daily with a meal., Disp: 60 tablet, Rfl: 0 .  cephALEXin (KEFLEX) 500 MG capsule, Take 1 capsule (500 mg total) by mouth 4 (four) times daily for 10 days., Disp: 40 capsule, Rfl: 0 .  feeding supplement,  GLUCERNA SHAKE, (GLUCERNA SHAKE) LIQD, Take 237 mLs by mouth 3 (three) times daily between meals. (Patient not taking: No sig reported), Disp: , Rfl: 0 .  Ferrous Sulfate Dried (FEOSOL) 200 (65 Fe) MG TABS, Take 1 tablet by mouth 2 (two) times daily with a meal. (Patient taking differently: Take 200 mg by mouth 2 (two) times daily with a meal.), Disp: 60 tablet, Rfl: 3 .  Glucose 4-6 GM-MG CHEW, Chew one as needed for blood glucose < 70 (Patient taking differently: Chew 1 tablet by mouth 2 (two) times daily as needed (low glucose). Chew one as needed for blood glucose < 70), Disp: 100 tablet, Rfl: 0 .  ibuprofen (ADVIL) 200 MG tablet, Take 400 mg by mouth every 6 (six) hours as needed for fever, headache or mild pain., Disp: , Rfl:  .  insulin detemir (LEVEMIR FLEXTOUCH) 100 UNIT/ML FlexPen, 30  units AM and 30 units PM (Patient taking differently: Inject 30 Units into the skin 2 (two) times daily.), Disp: 50 mL, Rfl: 1 .  insulin lispro (HUMALOG) 100 UNIT/ML KwikPen, Inject 8 Units into the skin 2 (two) times daily with a meal., Disp: 6 mL, Rfl: 1 .  Insulin Pen Needle 29G X 5MM MISC, Use with insulin pens, Disp: 100 each, Rfl: 1 .  losartan (COZAAR) 100 MG tablet, Take 1 tablet (100 mg total) by mouth daily., Disp: 30 tablet, Rfl: 3 .  ondansetron (ZOFRAN ODT) 4 MG disintegrating tablet, Take 1 tablet (4 mg total) by mouth every  8 (eight) hours as needed for nausea or vomiting., Disp: 10 tablet, Rfl: 0 .  Pancrelipase, Lip-Prot-Amyl, (CREON) 6000-19000 units CPEP, Take 1 capsule (6,000 Units total) by mouth in the morning, at noon, and at bedtime., Disp: 180 capsule, Rfl: 0 .  pantoprazole (PROTONIX) 40 MG tablet, Take 1 tablet (40 mg total) by mouth daily., Disp: 30 tablet, Rfl: 3 .  spironolactone (ALDACTONE) 25 MG tablet, Take 1 tablet (25 mg total) by mouth daily., Disp: 60 tablet, Rfl: 0 .  sulfamethoxazole-trimethoprim (BACTRIM DS) 800-160 MG tablet, Take 1 tablet by mouth 2 (two) times daily.  (Patient not taking: Reported on 10/06/2020), Disp: 14 tablet, Rfl: 0 .  torsemide (DEMADEX) 20 MG tablet, Take 1 tablet (20 mg total) by mouth daily., Disp: 60 tablet, Rfl: 0  Facility-Administered Medications Ordered in Other Visits:  .  LORazepam (ATIVAN) injection 0-4 mg, 0-4 mg, Intravenous, Q6H **OR** LORazepam (ATIVAN) tablet 0-4 mg, 0-4 mg, Oral, Q6H, Phylliss Bob, Elizabeth W, PA-C .  [START ON 10/09/2020] LORazepam (ATIVAN) injection 0-4 mg, 0-4 mg, Intravenous, Q12H **OR** [START ON 10/09/2020] LORazepam (ATIVAN) tablet 0-4 mg, 0-4 mg, Oral, Q12H, Phylliss Bob, Elizabeth W, PA-C .  thiamine tablet 100 mg, 100 mg, Oral, Daily **OR** thiamine (B-1) injection 100 mg, 100 mg, Intravenous, Daily, Lorin Glass, PA-C  Objective: Patient sounds stable on the phone.  They are in no apparent distress.  Breathing is non labored.  Mood and behavior are normal.  Laboratory Data:  Recent Results (from the past 2160 hour(s))  Basic metabolic panel     Status: Abnormal   Collection Time: 07/13/20 11:27 AM  Result Value Ref Range   Sodium 137 135 - 145 mmol/L   Potassium 4.0 3.5 - 5.1 mmol/L   Chloride 108 98 - 111 mmol/L   CO2 19 (L) 22 - 32 mmol/L   Glucose, Bld 85 70 - 99 mg/dL    Comment: Glucose reference range applies only to samples taken after fasting for at least 8 hours.   BUN 29 (H) 6 - 20 mg/dL   Creatinine, Ser 1.25 (H) 0.61 - 1.24 mg/dL   Calcium 8.1 (L) 8.9 - 10.3 mg/dL   GFR, Estimated >60 >60 mL/min    Comment: (NOTE) Calculated using the CKD-EPI Creatinine Equation (2021)    Anion gap 10 5 - 15    Comment: Performed at [facility], [place] [address], [place], [place] [address]  CBC     Status: Abnormal   Collection Time: 07/13/20 11:27 AM  Result Value Ref Range   WBC 12.2 (H) 4.0 - 10.5 K/uL   RBC 2.77 (L) 4.22 - 5.81 MIL/uL   Hemoglobin 8.3 (L) 13.0 - 17.0 g/dL   HCT 26.5 (L) 39.0 - 52.0 %   MCV 95.7 80.0 - 100.0 fL   MCH 30.0 26.0 - 34.0 pg   MCHC 31.3  30.0 - 36.0 g/dL   RDW 13.3 11.5 - 15.5 %   Platelets 219 150 - 400 K/uL   nRBC 0.0 0.0 - 0.2 %    Comment: Performed at [facility], [place] [address] [place], [place] [address]  Urinalysis, Routine w reflex microscopic     Status: Abnormal   Collection Time: 07/13/20  2:55 PM  Result Value Ref Range   Color, Urine YELLOW YELLOW   APPearance HAZY (A) CLEAR   Specific Gravity, Urine 1.009 1.005 - 1.030   pH 5.0 5.0 - 8.0   Glucose, UA NEGATIVE NEGATIVE mg/dL  Hgb urine dipstick MODERATE (A) NEGATIVE   Bilirubin Urine NEGATIVE NEGATIVE   Ketones, ur NEGATIVE NEGATIVE mg/dL   Protein, ur 30 (A) NEGATIVE mg/dL   Nitrite NEGATIVE NEGATIVE   Leukocytes,Ua LARGE (A) NEGATIVE   RBC / HPF 11-20 0 - 5 RBC/hpf   WBC, UA >50 (H) 0 - 5 WBC/hpf   Bacteria, UA NONE SEEN NONE SEEN   Mucus PRESENT    Budding Yeast PRESENT    Hyaline Casts, UA PRESENT     Comment: Performed at [facility], [place] [address] [place], [place] [address]  CBC with Differential     Status: Abnormal   Collection Time: 10/06/20  9:49 AM  Result Value Ref Range   WBC 10.7 (H) 4.0 - 10.5 K/uL    Comment: WHITE COUNT CONFIRMED ON SMEAR   RBC 3.30 (L) 4.22 - 5.81 MIL/uL   Hemoglobin 9.7 (L) 13.0 - 17.0 g/dL   HCT 30.2 (L) 39.0 - 52.0 %   MCV 91.5 80.0 - 100.0 fL   MCH 29.4 26.0 - 34.0 pg   MCHC 32.1 30.0 - 36.0 g/dL   RDW 13.6 11.5 - 15.5 %   Platelets 202 150 - 400 K/uL   nRBC 0.0 0.0 - 0.2 %   Neutrophils Relative % 81 %   Neutro Abs 8.7 (H) 1.7 - 7.7 K/uL   Lymphocytes Relative 11 %   Lymphs Abs 1.2 0.7 - 4.0 K/uL   Monocytes Relative 4 %   Monocytes Absolute 0.4 0.1 - 1.0 K/uL   Eosinophils Relative 3 %   Eosinophils Absolute 0.3 0.0 - 0.5 K/uL   Basophils Relative 1 %   Basophils Absolute 0.1 0.0 - 0.1 K/uL   Immature Granulocytes 0 %   Abs Immature Granulocytes 0.03 0.00 - 0.07 K/uL    Comment: Performed at [facility], [place] [address]  [place], [place] [address]  Comprehensive metabolic panel     Status: Abnormal   Collection Time: 10/06/20  9:49 AM  Result Value Ref Range   Sodium 137 135 - 145 mmol/L   Potassium 3.9 3.5 - 5.1 mmol/L   Chloride 106 98 - 111 mmol/L   CO2 20 (L) 22 - 32 mmol/L   Glucose, Bld 154 (H) 70 - 99 mg/dL    Comment: Glucose reference range applies only to samples taken after fasting for at least 8 hours.   BUN 38 (H) 6 - 20 mg/dL   Creatinine, Ser 1.20 0.61 - 1.24 mg/dL   Calcium 9.3 8.9 - 10.3 mg/dL   Total Protein 7.5 6.5 - 8.1 g/dL   Albumin 4.2 3.5 - 5.0 g/dL   AST 21 15 - 41 U/L   ALT 18 0 - 44 U/L   Alkaline Phosphatase 81 38 - 126 U/L   Total Bilirubin 0.8 0.3 - 1.2 mg/dL   GFR, Estimated >60 >60 mL/min    Comment: (NOTE) Calculated using the CKD-EPI Creatinine Equation (2021)    Anion gap 11 5 - 15    Comment: Performed at [facility], [place] [address], [place], [place] [address]  Lipase, blood     Status: None   Collection Time: 10/06/20  9:49 AM  Result Value Ref Range   Lipase 17 11 - 51 U/L    Comment: Performed at [facility], [place] [address], [place],  [address]  Urinalysis, Routine w reflex microscopic Urine, Clean Catch     Status: Abnormal   Collection  Time: 10/06/20 12:42 PM  Result Value Ref Range   Color, Urine YELLOW YELLOW   APPearance HAZY (A) CLEAR   Specific Gravity, Urine 1.012 1.005 - 1.030   pH 5.0 5.0 - 8.0   Glucose, UA NEGATIVE NEGATIVE mg/dL   Hgb urine dipstick LARGE (A) NEGATIVE   Bilirubin Urine NEGATIVE NEGATIVE   Ketones, ur NEGATIVE NEGATIVE mg/dL   Protein, ur 100 (A) NEGATIVE mg/dL   Nitrite POSITIVE (A) NEGATIVE   Leukocytes,Ua LARGE (A) NEGATIVE   RBC / HPF >50 (H) 0 - 5 RBC/hpf   WBC, UA >50 (H) 0 - 5 WBC/hpf   Bacteria, UA NONE SEEN NONE SEEN   WBC Clumps PRESENT     Comment: Performed at [facility], [place] [address] [place], [place] [address]  Rapid urine drug screen  (hospital performed)     Status: None   Collection Time: 10/06/20 12:42 PM  Result Value Ref Range   Opiates NONE DETECTED NONE DETECTED   Cocaine NONE DETECTED NONE DETECTED   Benzodiazepines NONE DETECTED NONE DETECTED   Amphetamines NONE DETECTED NONE DETECTED   Tetrahydrocannabinol NONE DETECTED NONE DETECTED   Barbiturates NONE DETECTED NONE DETECTED    Comment: (NOTE) DRUG SCREEN FOR MEDICAL PURPOSES ONLY.  IF CONFIRMATION IS NEEDED FOR ANY PURPOSE, NOTIFY LAB WITHIN 5 DAYS.  LOWEST DETECTABLE LIMITS FOR URINE DRUG SCREEN Drug Class                     Cutoff (ng/mL) Amphetamine and metabolites    1000 Barbiturate and metabolites    200 Benzodiazepine                 638 Tricyclics and metabolites     300 Opiates and metabolites        300 Cocaine and metabolites        300 THC                            50 Performed at [facility], [place] [address] [place],  [address]   Resp Panel by RT-PCR (Flu A&B, Covid) Nasopharyngeal Swab     Status: Abnormal   Collection Time: 10/06/20  1:57 PM   Specimen: Nasopharyngeal Swab; Nasopharyngeal(NP) swabs in vial transport medium  Result Value Ref Range   SARS Coronavirus 2 by RT PCR POSITIVE (A) NEGATIVE    Comment: RESULT CALLED TO, READ BACK BY AND VERIFIED WITH: S.WEST AT 1711 ON 03.02.22 BY N.Melany Wiesman (NOTE) SARS-CoV-2 target nucleic acids are DETECTED.  The SARS-CoV-2 RNA is generally detectable in upper respiratory specimens during the acute phase of infection. Positive results are indicative of the presence of the identified virus, but do not rule out bacterial infection or co-infection with other pathogens not detected by the test. Clinical correlation with patient history and other diagnostic information is necessary to determine patient infection status. The expected result is Negative.  Fact Sheet for Patients: EntrepreneurPulse.com.au  Fact Sheet for Healthcare  Providers: IncredibleEmployment.be  This test is not yet approved or cleared by the [place] FDA and  has been authorized for detection and/or diagnosis of SARS-CoV-2 by FDA under an Emergency Use Authorization (EUA).  This EUA will remain in effect (meaning this test  can be used) for the duration of  the COVID-19 declaration under Section 564(b)(1) of the Act, 21 U.S.C. section 360bbb-3(b)(1), unless the authorization is terminated or revoked sooner.  Influenza A by PCR NEGATIVE NEGATIVE   Influenza B by PCR NEGATIVE NEGATIVE    Comment: (NOTE) The Xpert Xpress SARS-CoV-2/FLU/RSV plus assay is intended as an aid in the diagnosis of influenza from Nasopharyngeal swab specimens and should not be used as a sole basis for treatment. Nasal washings and aspirates are unacceptable for Xpert Xpress SARS-CoV-2/FLU/RSV testing.  Fact Sheet for Patients: EntrepreneurPulse.com.au  Fact Sheet for Healthcare Providers: IncredibleEmployment.be  This test is not yet approved or cleared by the [place] FDA and has been authorized for detection and/or diagnosis of SARS-CoV-2 by FDA under an Emergency Use Authorization (EUA). This EUA will remain in effect (meaning this test can be used) for the duration of the COVID-19 declaration under Section 564(b)(1) of the Act, 21 U.S.C. section 360bbb-3(b)(1), unless the authorization is terminated or revoked.  Performed at [facility], [place] [address] [place], [place] [address]   Culture, blood (routine x 2)     Status: None (Preliminary result)   Collection Time: 10/06/20  5:56 PM   Specimen: BLOOD  Result Value Ref Range   Specimen Description      BLOOD SITE NOT SPECIFIED Performed at [place] [address], [place], [place] [address]    Special Requests      BOTTLES DRAWN AEROBIC ONLY Blood Culture results may not be optimal due to an inadequate  volume of blood received in culture bottles Performed at [place] [address], [place], [place] [address]    Culture      NO GROWTH < 24 HOURS Performed at [place] [address], [place], [place] [address]    Report Status PENDING   Culture, blood (routine x 2)     Status: None (Preliminary result)   Collection Time: 10/06/20  6:01 PM   Specimen: BLOOD  Result Value Ref Range   Specimen Description      BLOOD SITE NOT SPECIFIED Performed at [facility], [place] [address], [place], [place] [address]    Special Requests      BOTTLES DRAWN AEROBIC AND ANAEROBIC Blood Culture results may not be optimal due to an inadequate volume of blood received in culture bottles Performed at [facility], [place] [address] [place] [address] [place], [place] [address]    Culture      NO GROWTH < 24 HOURS Performed at [place] [address], [place], [place] [address]    Report Status PENDING      Assessment: 55 y.o. male with mild/moderate COVID 19 viral infection diagnosed on 3/3 at high risk for progression to severe COVID 19.  Plan:  This patient is a 55 y.o. male that meets the following criteria for Emergency Use Authorization of: Paxlovid 1. Age >12 yr AND > 40 kg 2. SARS-COV-2 positive test 3. Symptom onset < 5 days 4. Mild-to-moderate COVID disease with high risk for severe progression to hospitalization or death  I have spoken and communicated the following to the patient or parent/caregiver regarding: 1. Paxlovid is an unapproved drug that is authorized for use under an Emergency Use Authorization.  2. There are no adequate, approved, available products for the treatment of COVID-19 in adults who have mild-to-moderate COVID-19 and are at high risk for progressing to severe COVID-19, including hospitalization or death. 3. Other therapeutics are currently authorized. For additional information on all products authorized for treatment  or prevention of COVID-19, please see TanEmporium.pl.  4. There  are benefits and risks of taking this treatment as outlined in the "Fact Sheet for Patients and Caregivers."  5. "Fact Sheet for Patients and Caregivers" was reviewed with patient. A hard copy will be provided to patient from pharmacy prior to the patient receiving treatment. 6. Patients should continue to self-isolate and use infection control measures (e.g., wear mask, isolate, social distance, avoid sharing personal items, clean and disinfect "high touch" surfaces, and frequent handwashing) according to CDC guidelines.  7. The patient or parent/caregiver has the option to accept or refuse treatment. 8. Patient medication history was reviewed for potential drug interactions:Interaction with home meds: Atorvastatin  9. Patient's GFR was calculated to be >60, and they were therefore prescribed Normal dose (GFR>60) - nirmatrelvir 162m tab (2 tablet) by mouth twice daily AND ritonavir 1040mtab (1 tablet) by mouth twice daily   After reviewing above information with the patient, the patient agrees to receive Paxlovid.  Follow up instructions:    . Take prescription BID x 5 days as directed . Reach out to pharmacist for counseling on medication if desired . For concerns regarding further COVID symptoms please follow up with your PCP or urgent care . For urgent or life-threatening issues, seek care at your local emergency department  The patient was provided an opportunity to ask questions, and all were answered. The patient agreed with the plan and demonstrated an understanding of the instructions.   [facility]  The patient was advised to call their PCP or seek an in-person evaluation if the symptoms worsen or if the condition fails to improve as anticipated.   I  provided 10 minutes of non face-to-face telephone visit time during this encounter, and > 50% was spent counseling as documented under my assessment & plan.  KaAngelena FormPA-C 10/07/2020 /2:27 PM

## 2020-10-07 NOTE — Progress Notes (Addendum)
TOC CM spoke to Guinevere Ferrari, Director of [facility] Dept, [email or web address] .Ellamae Sia, has declined pt for COVID hotel. Pt contacted [facility], [place] and states she will contact Dr [name]. Spoke to Dr [name] and he is requesting to speak to Ms. Hawks regarding patient. Sent email to Darl Pikes, cc Dr [name]. [facility] is requesting a detailed plan about what care will provided to pt once he is at the COVID hotel. States they do have a Case manager to follow and have concerns that pt will not be able to care for himself in the community. Isidoro Donning RN CCM, [facility] ED TOC CM [phone]

## 2020-10-07 NOTE — Progress Notes (Signed)
TOC CM picked up pt's meds from [facility]. Pt has all meds in his room, and ED RN will go over at time of dc how to take his new meds. Explained to pt that his PCP, Dr [name] will be following him in the community. Waiting confirmation from [facility] Dept if they are going to accept for COVID hotel. Isidoro Donning RN CCM, [facility] ED TOC CM [phone]

## 2020-10-07 NOTE — Progress Notes (Signed)
TOC CM/CSW filled out COVID application and awaiting approval from [employer].    CSW will continue to follow for dc needs.  Venicia Vandall Tarpley-Carter, MSW, LCSW-A Pronouns:  She, Her, Hers                  [facility] Transitions of CareClinical Social Worker [email or web address] [phone]

## 2020-10-07 NOTE — Progress Notes (Signed)
TOC CM contacted [employer], Tammy Sours and he will bring pt's meds and belongings to [facility] ED entrance. Isidoro Donning RN CCM, [facility] ED TOC CM [phone]

## 2020-10-07 NOTE — Progress Notes (Signed)
TOC CM faxed MATCH letter with $0 copay to [facility]. Pt is homeless and going to [employer]. Copay override completed as pt needs abx at dc. Isidoro Donning RN CCM, [facility] TOC CM [phone]

## 2020-10-07 NOTE — Progress Notes (Signed)
TOC CM/CSW Received a request from [facility] stating that AVS needed to be faxed as well.  CSW will continue to follow for dc needs.  Aibhlinn Kalmar Tarpley-Carter, MSW, LCSW-A Pronouns:  She, Her, Hers                  [facility] Transitions of CareClinical Social Worker [email or web address] [phone]

## 2020-10-07 NOTE — Progress Notes (Signed)
TOC CM/CSW will go to [facility] pharmacy to pick up meds and deliver them to pt.  CSW will continue to follow for dc needs.  Sohan Potvin Tarpley-Carter, MSW, LCSW-A Pronouns:  She, Her, Hers                  [facility] Transitions of CareClinical Social Worker [email or web address] [phone]

## 2020-10-07 NOTE — Progress Notes (Addendum)
Last Name First name Ramada Room # Referring Agency Patient Provided Telephone number  Rodney Barber 104  *Here is the hotel information. Please call hotel phone and ask for above room number to get in contact with guest:   [facility]: Allayne Butcher Address: [address], [place], [place] [address] Phone Number: [phone] [facility] [phone]    [facility] Dept will provide transportation to [employer]. Updated ED RN to get pt ready and review with him his AVS. Rodney Barber receive information in an email also details of his COVID stay from [facility].  Rodney Donning RN CCM, [facility] ED TOC CM [phone]

## 2020-10-07 NOTE — ED Notes (Signed)
SW brought over clothes from [facility] and medications.

## 2020-10-07 NOTE — Progress Notes (Signed)
TOC CM/CSW spoke with [facility]/COVID Hotel.  Chantelle is requesting signed document stating that patient has all medications to verify that he has all 20 meds on AVS.    CSW will continue to follow for dc needs.  Pang Robers Tarpley-Carter, MSW, LCSW-A Pronouns:  She, Her, Hers                  [facility] Transitions of CareClinical Social Worker [email or web address] [phone]

## 2020-10-07 NOTE — Progress Notes (Signed)
TOC CM/CSW will fax signed document to [facility].  CSW will continue to follow for dc needs.  Hardy Harcum Tarpley-Carter, MSW, LCSW-A Pronouns:  She, Her, Hers                  [facility] Transitions of CareClinical Social Worker [email or web address] [phone]

## 2020-10-07 NOTE — Progress Notes (Signed)
  MATCH Medication Assistance Card  Name: Rodney Barber  ID (MRN): [id number]  Johnney Killian: [id number] RX Group: [id number]  Discharge Date: 10/07/2020  Expiration Date:10/15/2020  (must be filled within 7 days of discharge)     Dear Rodney Barber:  You have been approved to have the prescriptions written by your discharging physician filled through our [facility] (Medication Assistance Through [facility]) program. This program allows for a one-time (no refills) 34-day supply of selected medications for a low copay amount.  The copay is $0.00 per prescription. For instance, if you have one prescription, you will pay $3.00; for two prescriptions, you pay $0.00; for three prescriptions, you pay $0.00; and so on.  Only certain pharmacies are participating in this program with [facility]. You will need to select one of the pharmacies from the attached list and take your prescriptions, this letter, and your photo ID to one of the participating pharmacies.   We are excited that you are able to use the [facility] program to get your medications. These prescriptions must be filled within 7 days of hospital discharge or they will no longer be valid for the [facility] program. Should you have any problems with your prescriptions please contact your case management team member at [phone] or [phone]  for [place].   Thank you,    [facility]

## 2020-10-07 NOTE — Progress Notes (Signed)
Dr [name] requesting updated on his Medicaid application. Request/email sent to [email or web address]; [email or web address]; [email or web address], to follow up on Medicaid and SSDI application and information be sent to his PCP, Dr [name] office.Isidoro Donning RN CCM, [facility] ED TOC CM [phone]

## 2020-10-08 ENCOUNTER — Telehealth: Payer: Self-pay

## 2020-10-08 ENCOUNTER — Telehealth: Payer: Self-pay | Admitting: Critical Care Medicine

## 2020-10-08 NOTE — Progress Notes (Signed)
Thank you Rodney Barber!

## 2020-10-08 NOTE — Telephone Encounter (Signed)
Patient would like to speak with the doctor or nurse regarding his medication for atorvastatin (LIPITOR) 80 MG tablet.  He stated he mixed up his medication and is not sure which he should be taking.  Please call patient to discuss at [phone]

## 2020-10-08 NOTE — Telephone Encounter (Signed)
Transition Care Management Unsuccessful Follow-up Telephone Call  Date of discharge and from where:  10/07/2020 from [facility]  Attempts:  1st Attempt  Reason for unsuccessful TCM follow-up call:  Left voice message

## 2020-10-10 LAB — URINE CULTURE: Culture: >=100000 — AB

## 2020-10-11 ENCOUNTER — Telehealth: Payer: Self-pay | Admitting: Emergency Medicine

## 2020-10-11 ENCOUNTER — Ambulatory Visit: Payer: Self-pay | Admitting: Cardiology

## 2020-10-11 LAB — CULTURE, BLOOD (ROUTINE X 2)
Culture: NO GROWTH
Culture: NO GROWTH

## 2020-10-11 NOTE — Telephone Encounter (Signed)
Pt was called and a VM was left informing pt to return phone call regarding medication.

## 2020-10-11 NOTE — Telephone Encounter (Signed)
Transition Care Management Follow-up Telephone Call  Date of discharge and from where: 10/07/2020 from [facility]  How have you been since you were released from the hospital? Pt states   Any questions or concerns? No  Items Reviewed:  Did the pt receive and understand the discharge instructions provided? Yes   Medications obtained and verified? Yes   Other? No   Any new allergies since your discharge? No   Dietary orders reviewed? no  Do you have support at home? Yes   Functional Questionnaire: (I = Independent and D = Dependent) ADLs: I  Bathing/Dressing- I  Meal Prep- I  Eating- I  Maintaining continence- I  Transferring/Ambulation- I  Managing Meds- I  Follow up appointments reviewed:   PCP Hospital f/u appt confirmed? No    Specialist Hospital f/u appt confirmed? Yes  Scheduled to see [name], MD on 10/18/2020 @ 02:20pm.  Are transportation arrangements needed? No   If their condition worsens, is the pt aware to call PCP or go to the Emergency Dept.? Yes  Was the patient provided with contact information for the PCP's office or ED? Yes  Was to pt encouraged to call back with questions or concerns? Yes

## 2020-10-11 NOTE — Telephone Encounter (Signed)
Post ED Visit - Positive Culture Follow-up  Culture report reviewed by antimicrobial stewardship pharmacist: [facility] []  , Pharm.D. []  Enzo Bi, Pharm.D., BCPS AQ-ID []  , Pharm.D., BCPS []  Celedonio Miyamoto, Pharm.D., BCPS []  [place], Garvin Fila.D., BCPS, AAHIVP []  , Pharm.D., BCPS, AAHIVP []  Georgina Pillion, PharmD, BCPS []  , PharmD, BCPS []  [place], PharmD, BCPS []  [place], PharmD []  , PharmD, BCPS []  Estella Husk, PharmD  Pharmacy Team []  Lysle Pearl, PharmD []  , PharmD []  Phillips Climes, PharmD []  , Rph []  Agapito Games) , PharmD []  Verlan Friends, PharmD []  , PharmD []  Mervyn Gay, PharmD []  , PharmD []  Vinnie Level, PharmD []  [facility], PharmD []  , PharmD []  Len Childs, PharmD   Positive urine culture Treated with cephalexin, organism sensitive to the same and no further patient follow-up is required at this time.  10/11/2020, 10:27 AM

## 2020-10-15 IMAGING — DX DG CHEST 1V
1 series · 1 of 1 positions shown · non-contrast
Comparison: 12/28/2019 at 7957 hours

CLINICAL DATA: Post intubation

EXAM:
CHEST  1 VIEW

[chest]
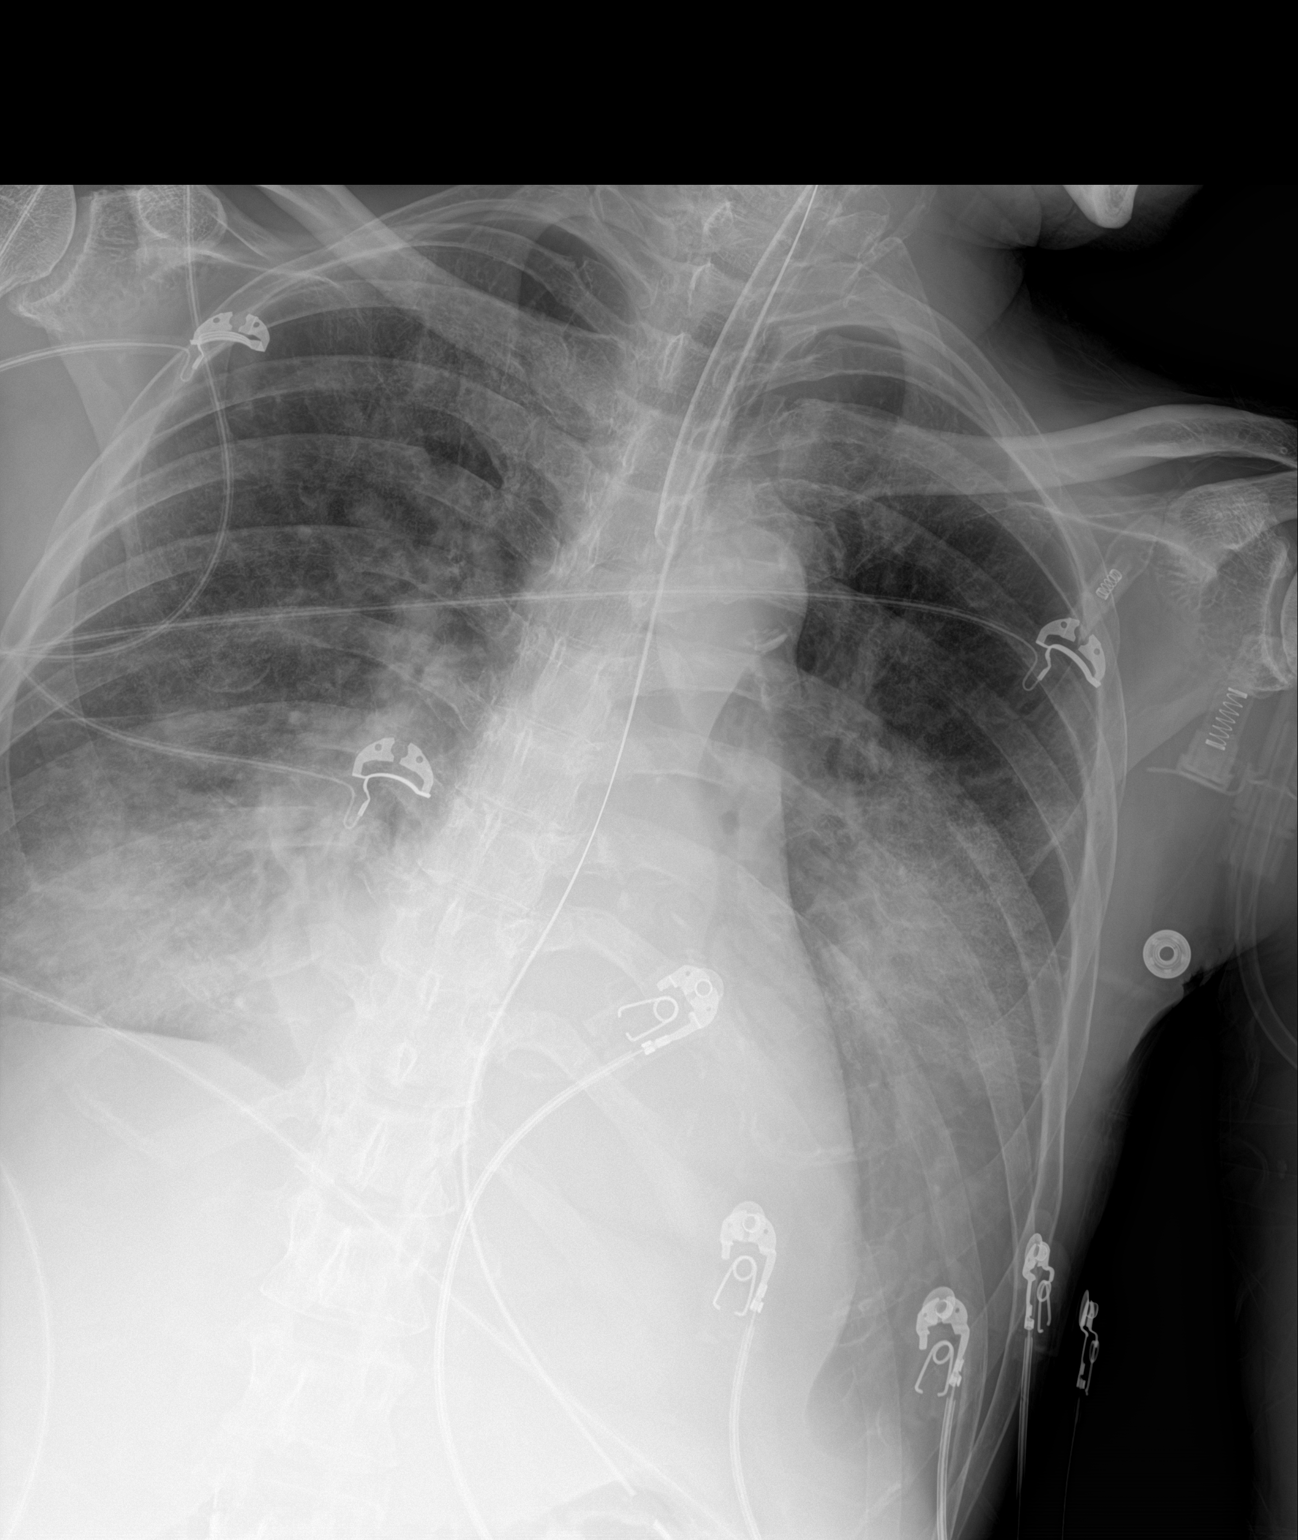

[1 of 1 positions shown; findings below may reference images not displayed]

FINDINGS: Endotracheal tube terminates 3 cm above the carina.

Enteric tube terminates in the gastric cardia.

Multifocal pneumonia, lingular and lower lobe predominant. Suspected
small left pleural effusion.

The heart is normal in size.  Thoracic aortic atherosclerosis.
IMPRESSION: Endotracheal tube terminates 3 cm above the carina.

Enteric tube terminates in the gastric cardia.

Multifocal pneumonia, lower lobe predominant.

## 2020-10-15 NOTE — Progress Notes (Deleted)
Cardiology Office Note:    Date:  10/15/2020   ID:  Rodney Barber, DOB 11-14-1965, MRN [id number]  PCP:  [name], MD  [facility] Cardiologist:  [name], MD  [facility] Electrophysiologist:  None   Referring MD: [name], MD   No chief complaint on file.   History of Present Illness:    Rodney Barber is a 55 y.o. male with a hx of uncontrolled DM, chronic pancreatitis, hypertension, tobacco abuse, noncompliance and  homelessness.  Previous echocardiogram obtained in October 2020 showed EF 60 to 65%, pericardial effusion posterior to the left ventricle. Repeat echocardiogram in March 2021 showed EF 40 to 45%, grade 1 DD, small filamentous mobile echodensity unchanged from the previous TEE.  He was admitted in May 2021 due to worsening shortness of breath.  Echocardiogram obtained in the hospital demonstrated new LV dysfunction with EF of 35 to 40%.  BNP was 550.  CT showed bilateral pleural effusion and coronary artery calcification.  He was treated for acute on chronic combined systolic and diastolic heart failure using IV Lasix.  Myoview performed on 01/01/2020 was high risk with large area of defect in the basal inferolateral, mid anteroseptal, mid inferolateral and apical septal location consistent with previous MI with peri-infarct ischemia.  He subsequently underwent cardiac catheterization on the following day on 5/28 which showed 30% proximal to distal LAD lesion, 60% mid to distal left circumflex lesion, 70% mid RCA lesion, EF was 25 to 35% by LV gram.  He had elevated LVEDP of 24 mmHg suggestive of volume overload.  His LV dysfunction was felt to be out of proportion to the degree of CAD, medical therapy was recommended for nonischemic cardiomyopathy.  He was placed on carvedilol, losartan, and Aldactone.  He was eventually discharged to [facility] house on 20 mg daily of torsemide.  On his last visit Echo was repeated and unchanged with EF 35-40%. LE  arterial dopplers were normal.    Patient presents today for follow-up.  He denies significant shortness of breath but does state if he exerts himself he gets out of breath. He complains that his legs from the knees down stay cold and hurt. Not really worse with activity. No chest pain. Still smoking 3 cigs/day. No edema. Tolerating medication well and reports compliance.    Past Medical History:  Diagnosis Date  . Diabetes mellitus   . Hypertension   . Pancreatitis     Past Surgical History:  Procedure Laterality Date  . ERCP  06/27/2011   Procedure: ENDOSCOPIC RETROGRADE CHOLANGIOPANCREATOGRAPHY (ERCP);  Surgeon: Petra Kuba, MD;  Location: [facility];  Service: Endoscopy;  Laterality: N/A;  . ERCP W/ PLASTIC STENT PLACEMENT  03/2011  . IR US GUIDE BX ASP/DRAIN  05/16/2019  . LEFT HEART CATH AND CORONARY ANGIOGRAPHY N/A 01/02/2020   Procedure: LEFT HEART CATH AND CORONARY ANGIOGRAPHY;  Surgeon: Swaziland, Peter M, MD;  Location: [facility] INVASIVE CV LAB;  Service: Cardiovascular;  Laterality: N/A;  . TEE WITHOUT CARDIOVERSION N/A 05/20/2019   Procedure: TRANSESOPHAGEAL ECHOCARDIOGRAM (TEE);  Surgeon: Chrystie Nose, MD;  Location: [facility] ENDOSCOPY;  Service: Cardiovascular;  Laterality: N/A;    Current Medications: No outpatient medications have been marked as taking for the 10/18/20 encounter (Appointment) with [place], Peter M, MD.   Current Facility-Administered Medications for the 10/18/20 encounter (Appointment) with [place], Peter M, MD  Medication  . Insta-Glucose 77.4 % GEL 1 Tube     Allergies:   Patient has no known allergies.  Social History   Socioeconomic History  . Marital status: Single    Spouse name: Not on file  . Number of children: Not on file  . Years of education: Not on file  . Highest education level: Not on file  Occupational History  . Not on file  Tobacco Use  . Smoking status: Current Every Day Smoker    Packs/day: 0.50    Years: 30.00    Pack years:  15.00    Types: Cigarettes  . Smokeless tobacco: Never Used  . Tobacco comment: 6 cigarett /day  Vaping Use  . Vaping Use: Never used  Substance and Sexual Activity  . Alcohol use: No  . Drug use: Not Currently    Types: "Crack" cocaine, Other-see comments, Cocaine    Comment: last smoked crack 12-16-16  . Sexual activity: Never  Other Topics Concern  . Not on file  Social History Narrative  . Not on file   Social Determinants of Health   Financial Resource Strain: Not on file  Food Insecurity: Not on file  Transportation Needs: Not on file  Physical Activity: Not on file  Stress: Not on file  Social Connections: Not on file     Family History: The patient's family history includes Diabetes in his brother and mother.  ROS:   Please see the history of present illness.     All other systems reviewed and are negative.  EKGs/Labs/Other Studies Reviewed:    The following studies were reviewed today:  LHC 01/02/2020  Prox LAD to Dist LAD lesion is 30% stenosed.  Mid Cx to Dist Cx lesion is 60% stenosed.  Mid RCA lesion is 70% stenosed.  There is severe left ventricular systolic dysfunction.  LV end diastolic pressure is mildly elevated.  The left ventricular ejection fraction is 25-35% by visual estimate.  1. Diffuse coronary atherosclerosis with heavy calcification. 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 Severe LV dysfunction EF estimated at 25-30% 3. Elevated EDP 24 mm Hg  Plan: aggressive medical therapy for CHF. Will add aldactone to Coreg, lasix, and losartan. Titrate as BP allows. High dose statin therapy. LV dysfunction is out of proportion to the degree of CAD.  TTE 12/28/2019 1. Left ventricular ejection fraction, by estimation, is 35 to 40%. The  left ventricle has moderately decreased function. The left ventricle  demonstrates global hypokinesis. There is moderate concentric left  ventricular hypertrophy. Left ventricular   diastolic function could not be evaluated.  2. Right ventricular systolic function is normal. The right ventricular  size is normal. There is normal pulmonary artery systolic pressure.  3. The mitral valve is normal in structure. Trivial mitral valve  regurgitation.  4. The aortic valve is tricuspid. Aortic valve regurgitation is not  visualized.  5. The inferior vena cava is dilated in size with >50% respiratory  variability, suggesting right atrial pressure of 8 mmHg.  Echo 05/04/20: IMPRESSIONS    1. Left ventricular ejection fraction, by estimation, is 35 to 40%. The  left ventricle has moderately decreased function. The left ventricle  demonstrates global hypokinesis. The left ventricular internal cavity size  was mildly dilated. There is mild  left ventricular hypertrophy. Left ventricular diastolic parameters are  consistent with Grade I diastolic dysfunction (impaired relaxation).  2. Right ventricular systolic function is normal. The right ventricular  size is normal.  3. The mitral valve is normal in structure. Trivial mitral valve  regurgitation. No evidence of mitral stenosis.  4. The aortic  valve is tricuspid. Aortic valve regurgitation is not  visualized. Mild aortic valve sclerosis is present, with no evidence of  aortic valve stenosis.  5. The inferior vena cava is normal in size with greater than 50%  respiratory variability, suggesting right atrial pressure of 3 mmHg.   LE arterial dopplers: 06/08/20: Summary:  Right: Resting right ankle-brachial index is within normal range. No  evidence of significant right lower extremity arterial disease. The right  toe-brachial index is abnormal.   Left: Resting left ankle-brachial index is within normal range. No  evidence of significant left lower extremity arterial disease. The left  toe-brachial index is normal.   EKG:  EKG is not ordered today.    Recent Labs: 10/21/2019: TSH 2.097 12/28/2019: B Natriuretic  Peptide 544.4 12/30/2019: Magnesium 2.3 10/06/2020: ALT 18; BUN 38; Creatinine, Ser 1.20; Hemoglobin 9.7; Platelets 202; Potassium 3.9; Sodium 137  Recent Lipid Panel    Component Value Date/Time   CHOL 105 04/20/2020 1450   TRIG 142 04/20/2020 1450   HDL 32 (L) 04/20/2020 1450   CHOLHDL 3.3 04/20/2020 1450   CHOLHDL 3.7 01/01/2020 0550   VLDL 20 01/01/2020 0550   LDLCALC 48 04/20/2020 1450    Physical Exam:    VS:  There were no vitals taken for this visit.    Wt Readings from Last 3 Encounters:  06/01/20 137 lb 3.2 oz (62.2 kg)  05/10/20 127 lb 13.9 oz (58 kg)  04/20/20 127 lb 9.6 oz (57.9 kg)     GEN:  Well nourished, thin,  in no acute distress HEENT: Normal NECK: No JVD; No carotid bruits LYMPHATICS: No lymphadenopathy CARDIAC: RRR, no murmurs, rubs, gallops RESPIRATORY:  Clear to auscultation without rales, wheezing or rhonchi  ABDOMEN: Soft, non-tender, non-distended MUSCULOSKELETAL:  No edema; No deformity  SKIN: Warm and dry NEUROLOGIC:  Alert and oriented x 3 PSYCHIATRIC:  Normal affect   ASSESSMENT:    No diagnosis found. PLAN:    In order of problems listed above:  1. Chronic combined systolic and diastolic heart failure: Nonischemic CM. EF 35-40%. Euvolemic on physical exam.  On Coreg, losartan and aldactone.   2. CAD: cardiac catheterization showed moderate disease, cardiomyopathy is nonischemic in nature.  No anginal symptoms. On aspirin and Lipitor  3. Hyperlipidemia: Continue Lipitor 80 mg daily.  Last LDL 61. Labs followed by [facility].   4. DM2: Uncontrolled, on insulin.   5. Hypertension: Stable on current therapy.  6.   Bilateral leg pain. Normal LE dopplers.   7.   Tobacco abuse. Recommend complete cessation.    Medication Adjustments/Labs and Tests Ordered: Current medicines are reviewed at length with the patient today.  Concerns regarding medicines are outlined above.  No orders of the defined types were placed in this  encounter.  No orders of the defined types were placed in this encounter.   There are no Patient Instructions on file for this visit.   Signed, [name], MD  10/15/2020 8:54 AM    [facility]

## 2020-10-17 IMAGING — DX DG CHEST 1V PORT
1 series · 1 of 1 positions shown · non-contrast
Comparison: Chest CTA 12/28/2019. Portable chest earlier today at
8582 hours.

CLINICAL DATA: 54-year-old male status post thoracentesis on the
right.

EXAM:
PORTABLE CHEST 1 VIEW

[chest]
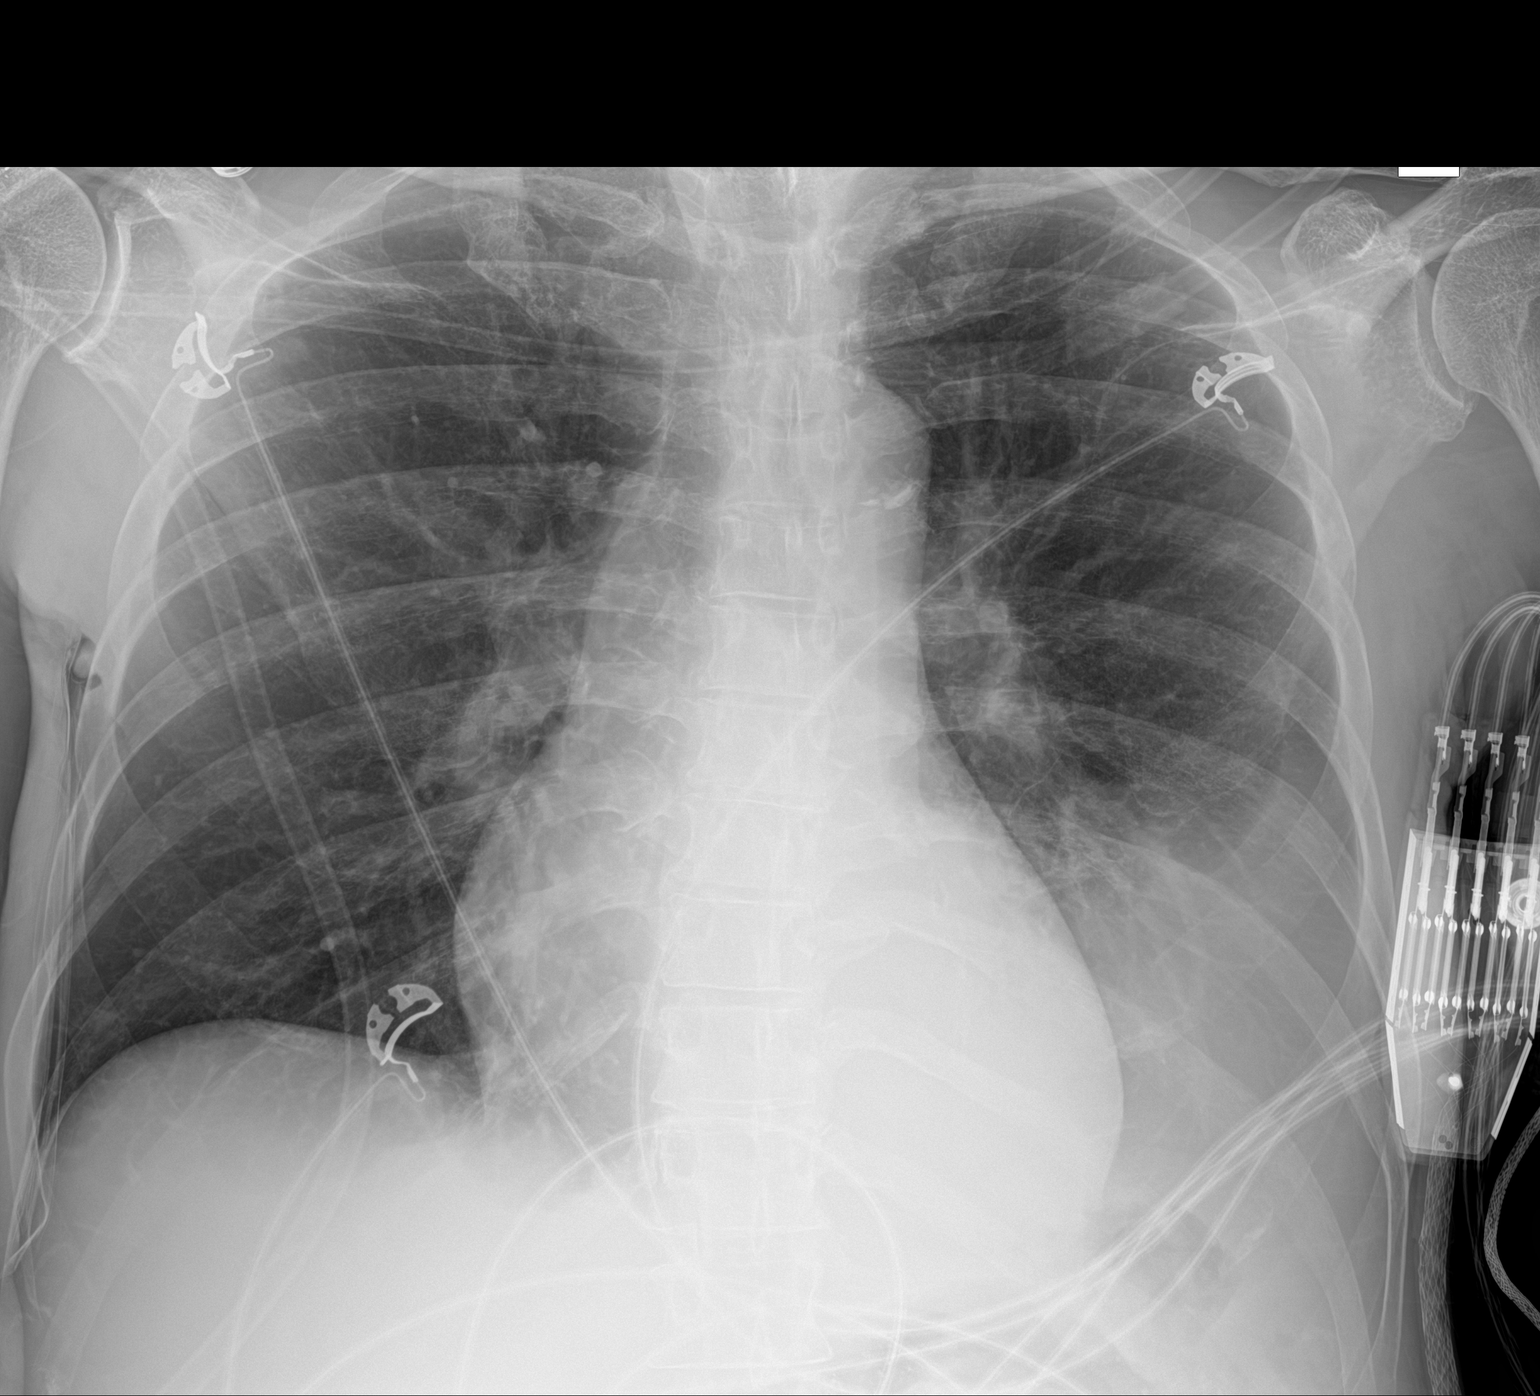

[1 of 1 positions shown; findings below may reference images not displayed]

FINDINGS: Portable AP semi upright view at 5254 hours. No residual pleural
effusion is evident on the right. No pneumothorax identified.
Persistent left side veiling pleural effusion.

Extubated and enteric tube removed. Mediastinal contours are stable
and within normal limits. No areas of worsening ventilation.
IMPRESSION: 1. No pneumothorax following right thoracentesis and no residual
right pleural effusion is evident.
2. Stable left pleural effusion.
3. Extubated and enteric tube removed.

## 2020-10-18 ENCOUNTER — Ambulatory Visit: Payer: Self-pay | Admitting: Cardiology

## 2020-10-18 ENCOUNTER — Other Ambulatory Visit: Payer: Self-pay | Admitting: Critical Care Medicine

## 2020-10-18 ENCOUNTER — Other Ambulatory Visit: Payer: Self-pay | Admitting: *Deleted

## 2020-10-18 DIAGNOSIS — IMO0002 Reserved for concepts with insufficient information to code with codable children: Secondary | ICD-10-CM

## 2020-10-18 DIAGNOSIS — E0865 Diabetes mellitus due to underlying condition with hyperglycemia: Secondary | ICD-10-CM

## 2020-10-18 MED ORDER — INSULIN LISPRO (1 UNIT DIAL) 100 UNIT/ML (KWIKPEN)
8.0000 [IU] | PEN_INJECTOR | Freq: Two times a day (BID) | SUBCUTANEOUS | 1 refills | Status: DC
Start: 2020-10-18 — End: 2020-11-15

## 2020-10-18 MED ORDER — TORSEMIDE 20 MG PO TABS
20.0000 mg | ORAL_TABLET | Freq: Every day | ORAL | 5 refills | Status: DC
Start: 2020-10-18 — End: 2020-10-27

## 2020-10-18 MED ORDER — LEVEMIR FLEXTOUCH 100 UNIT/ML ~~LOC~~ SOPN
PEN_INJECTOR | SUBCUTANEOUS | 1 refills | Status: DC
Start: 2020-10-18 — End: 2020-11-15

## 2020-10-18 MED ORDER — CARVEDILOL 25 MG PO TABS: 25.0000 mg | ORAL_TABLET | Freq: Two times a day (BID) | ORAL | 5 refills | Status: DC

## 2020-10-19 MED FILL — ?HUMALOG 100 UNITS/ML KWIKP: 100 | 37 days supply | Qty: 6 | Fill #0

## 2020-10-20 ENCOUNTER — Other Ambulatory Visit (HOSPITAL_COMMUNITY): Payer: Self-pay | Admitting: Critical Care Medicine

## 2020-10-20 ENCOUNTER — Other Ambulatory Visit: Payer: Self-pay | Admitting: Critical Care Medicine

## 2020-10-20 ENCOUNTER — Encounter: Payer: Self-pay | Admitting: Critical Care Medicine

## 2020-10-20 MED ORDER — CEPHALEXIN 500 MG PO CAPS: 500.0000 mg | ORAL_CAPSULE | Freq: Four times a day (QID) | ORAL | 0 refills | Status: AC

## 2020-10-20 MED ORDER — NYSTATIN 100000 UNIT/GM EX CREA: 1.0000 "application " | TOPICAL_CREAM | Freq: Two times a day (BID) | CUTANEOUS | 0 refills | Status: DC

## 2020-10-20 NOTE — Progress Notes (Signed)
Is a 55 year old male seen in the [place] [facility] he is run out of his Levemir  On exam blood pressure one 2/54 pulse 74 respirations 12 saturation 98% room air he does complain of some dysuria still he slowly recovering from urosepsis.  Also had Covid which he is recovered from.  Also complains of itching in the left groin  On exam there is yeast overgrowth in the left groin and the remainder the exam is unremarkable  Plan is to give him Keflex 500 mg 4 times daily for urinary tract infection recurrence and mild statin cream to the left groin.  We also will get his Levemir refilled.

## 2020-10-27 ENCOUNTER — Other Ambulatory Visit (HOSPITAL_COMMUNITY): Payer: Self-pay | Admitting: Critical Care Medicine

## 2020-10-27 ENCOUNTER — Encounter: Payer: Self-pay | Admitting: Critical Care Medicine

## 2020-10-27 ENCOUNTER — Other Ambulatory Visit: Payer: Self-pay | Admitting: Critical Care Medicine

## 2020-10-27 DIAGNOSIS — K219 Gastro-esophageal reflux disease without esophagitis: Secondary | ICD-10-CM

## 2020-10-27 DIAGNOSIS — E0865 Diabetes mellitus due to underlying condition with hyperglycemia: Secondary | ICD-10-CM

## 2020-10-27 DIAGNOSIS — E785 Hyperlipidemia, unspecified: Secondary | ICD-10-CM

## 2020-10-27 DIAGNOSIS — IMO0002 Reserved for concepts with insufficient information to code with codable children: Secondary | ICD-10-CM

## 2020-10-27 DIAGNOSIS — I10 Essential (primary) hypertension: Secondary | ICD-10-CM

## 2020-10-27 MED ORDER — PANTOPRAZOLE SODIUM 40 MG PO TBEC
40.0000 mg | DELAYED_RELEASE_TABLET | Freq: Every day | ORAL | 3 refills | Status: DC
Start: 2020-10-27 — End: 2020-11-15

## 2020-10-27 MED ORDER — TORSEMIDE 20 MG PO TABS: 20.0000 mg | ORAL_TABLET | Freq: Every day | ORAL | 5 refills | Status: DC

## 2020-10-27 MED ORDER — CARVEDILOL 25 MG PO TABS: 25.0000 mg | ORAL_TABLET | Freq: Two times a day (BID) | ORAL | 5 refills | Status: DC

## 2020-10-27 MED ORDER — SPIRONOLACTONE 25 MG PO TABS: 25.0000 mg | ORAL_TABLET | Freq: Every day | ORAL | 0 refills | Status: DC

## 2020-10-27 MED ORDER — FEOSOL 200 (65 FE) MG PO TABS
200.0000 mg | ORAL_TABLET | Freq: Two times a day (BID) | ORAL | 1 refills | Status: DC
Start: 2020-10-27 — End: 2020-11-15

## 2020-10-27 MED ORDER — LOSARTAN POTASSIUM 100 MG PO TABS: 100.0000 mg | ORAL_TABLET | Freq: Every day | ORAL | 3 refills | Status: DC

## 2020-10-27 MED ORDER — ATORVASTATIN CALCIUM 80 MG PO TABS
80.0000 mg | ORAL_TABLET | Freq: Every day | ORAL | 3 refills | Status: DC
Start: 2020-10-27 — End: 2020-11-15

## 2020-10-27 MED FILL — ATORVASTATIN 80 MG TABLET: 80 | 30 days supply | Qty: 30 | Fill #0

## 2020-10-27 MED FILL — SPIRONOLACTONE 25 MG TABS: 25 | 30 days supply | Qty: 30 | Fill #0

## 2020-10-27 MED FILL — LOSARTAN POTASSIUM 100 MG T: 100 | 30 days supply | Qty: 30 | Fill #0

## 2020-10-27 MED FILL — PANTOPRAZOLE SOD DR 40 MG T: 40 | 30 days supply | Qty: 30 | Fill #0

## 2020-10-27 NOTE — Progress Notes (Signed)
This is a 55 year old male with diabetes hypertension coronary disease seen in the [place] [facility]  On exam blood pressure 140/74 saturation 99% room air pulse 75 blood sugar was 379  Exam is unremarkable patient had refills on all his medications given his pill organizer was read filled  Patient is interested in assisted living we will look into this

## 2020-11-03 ENCOUNTER — Encounter: Payer: Self-pay | Admitting: Physician Assistant

## 2020-11-03 MED FILL — CARVEDILOL 25 MG TABLET: 25 | 30 days supply | Qty: 60 | Fill #0

## 2020-11-03 MED FILL — CREON DR 6,000 UNITS CAP: 6000-19000 | 30 days supply | Qty: 180 | Fill #1

## 2020-11-03 MED FILL — FERROUS SULFATE 325 MG TAB: 325 (65 FE) | 30 days supply | Qty: 100 | Fill #1

## 2020-11-03 NOTE — Progress Notes (Signed)
Pt came by to make sure he will not run out of his medications, and to get a rash checked out.  He has not checked his CBG today.  Says he is generally compliant with his meds, but the compartment for today's am meds is filled.  CBG is 178, he checked on his meter with a little help.  The rash on his back has been there for a couple of weeks.  It is itchy, but no drainage. The rash starts on his neck and arms and goes down his back/buttock and onto his legs and groin. He got the Nystatin cream for the part of the rash that is in his groin, but it has not seemed to help. Not sure he has used it twice a day every day.   On exam, he has a patchy sandpapery rash, with signs of excoriation. No flaking skin, no open lesions noted. No other lesions or sores. Uneven edges, not clearly circumscribed. No hx fever, sore throat.   Requested he use the Nystatin cream on his groin bid, gave him Eucerin cream to use on his arms and back. No steroid cream in the bins.  Let Myriam Jacobson know in am if rx is no help.   Checked his meds and refills sent in.   Theodore Demark, PA-C 11/03/2020 4:03 PM

## 2020-11-10 ENCOUNTER — Telehealth: Payer: Self-pay

## 2020-11-10 ENCOUNTER — Encounter: Payer: Self-pay | Admitting: Critical Care Medicine

## 2020-11-10 NOTE — Progress Notes (Signed)
Patient ID: Rodney Barber, male   DOB: Mar 24, 1966, 55 y.o.   MRN: [id number] This patient is seen today at the [facility] and we refilled all his pill organizer medications his blood sugar was 374 blood pressure 150/60 pulse 79 saturation 99% room air I tried to check and assisted living for him but need to verify his Medicaid status his exam was otherwise unremarkable

## 2020-11-10 NOTE — Telephone Encounter (Signed)
Per Paula Compton Referral Specialist, the patient does not have medicaid

## 2020-11-14 NOTE — Progress Notes (Signed)
Subjective:    Patient ID: Rodney Barber, male    DOB: [birth year], 55 y.o.   MRN: [id number]  12/09/19 This is a 55 year old male who seen previously in the [place] [facility] comes in today with recent hospitalization for hypoglycemia we was on 35 units of Levemir with not eating regularly.  Note he has not been using his pancreatic enzyme medicine regularly.  Also he is missed doses of lisinopril and amlodipine.  On exam blood pressure 186/94 glucose is greater than 400  Patient is thin and in no acute distress chest was clear abdomen was slightly tender extremities showed no edema  Impression is that of type 2 diabetes poorly controlled secondary to poor dietary habits  Also hypertension poorly controlled at this time  12/10/2019 This patient is seen for the [facility] time in the [facility].  He previously been followed intermittently at the [facility].  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  He is also had very poor dietary habits and skips meals at times.  He had an episode of severe hypoglycemia on 30 units of Levemir daily and was admitted between the ninth and 10 April.  Below is the discharge summary.  Admitted early April hypoglycemia   Admit date: 11/14/2019 Discharge date: 11/15/2019  Admitted From: Shelter Disposition: Shelter  Recommendations for Outpatient Follow-up:  1. Follow up with PCP in 1-2 weeks 2. Please obtain BMP/CBC in one week  Discharge Condition: Stable CODE STATUS: Full Diet recommendation: Diabetic diet as tolerated  Brief/Interim Summary: Rodney Barber a 55 y.o.malewith medical history significant oftype 2 diabetes/IDDM, hypertension, pancreatitis, tobacco use, and homelessness who presents with hypoglycemia. Patient is alert and oriented at present but unable to give great recollection of this morning. He  states he recalls speaking with EMS and understands he is now at [facility] because of the low blood sugar. He states he was in usual state of health through last night and took his insulin in the evening as he normally does. However he cannot tell me what dose of insulin he takes. However he does state that he does not normally get a low blood sugar and has not had issues like this throughout the week. He denies any fevers or chills. He denies any new rashes, urinary symptoms, nausea vomiting or diarrhea. He reports he is otherwise taking his meds without assistance. He denies any other symptoms, no cough/shortness of breath, chest pain. Per EMS report, patient was altered and noted to be hypoglycemic to 35. He received D10, 250 cc in route and in the emergency room was again noted to be hypoglycemic to 30s. Patient reports using tobacco, approximately half pack per day. No alcohol, no drugs. He reports ambulating with the assistance of a walker, denies any dizziness lightheadedness or falls.  Patient admitted as above with acute symptomatic hypoglycemia in the setting of medication and dietary noncompliance.  Patient is more awake alert oriented this morning, we discussed his insulin regimen at length.  Patient declines any urinary symptoms, antibiotics will be discontinued appropriately.  It appears he has not been using his blood glucose monitor as he has run out of test strips and lancets, because of this he has been dosing his insulin regularly likely the reason for his A1c being quite elevated but being admitted for acute symptomatic hypoglycemia.  Given concern for ongoing hypoglycemia patient's Lantus has been decreased to 20 units nightly,  discussed that he needs to maintain his diabetic diet and take this medication every night, follow his Accu-Cheks as scheduled and follow-up with his PCP in the next 3 to 4 days.  He indicates his last office visit was supposed to be Wednesday but due to  transportation issues not being provided he was unable to point to their office.  Discussed that should he continue to be noncompliant or use medications inappropriately he had a markedly increased risk of death given his profound hypoglycemia.  Patient understood the risks of not continued medical compliance follow-up and medication administrations.  Patient is otherwise stable and agreeable for discharge home, ambulating without difficulty, tolerating p.o. quite well and glucose remains somewhat well controlled although minimally elevated in the setting of dextrose and hypoglycemic bolusing overnight.   Discharge Diagnoses:  Principal Problem:   Hypoglycemia Active Problems:   Severe malnutrition (HCC)   Protein-calorie malnutrition, severe (HCC)   Altered mental status   AMS (altered mental status)   Noncompliance with medications  Patient comes in today and still is running high blood sugars today his CBG is 340.  He has no longer drinking.  He is not eating healthy foods.  He is trying to eat some protein vegetables and occasionally bananas.  He also has severe dental issues with significant dental caries and periodontal disease.   04/20/2020 Since last OV in early May in hospital again later in may:  This patient was seen in follow-up last early made and subsequent to that was admitted for acute heart failure with coronary artery disease newly diagnosed.  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  Patient was seen by cardiology recently who did not make any medication changes  Below is the discharge summary from May Admit date: 12/28/2019 Discharge date: 01/06/2020  Time spent: 35 minutes  Recommendations for Outpatient Follow-up:  [facility], office to arrange follow-up [place] [facility] on 6/15 at 11 AM Please check BMP in 1 week  Discharge Diagnoses:   Acute hypoxic respiratory failure Acute systolic and diastolic CHF, EF of 61% Coronary artery disease Chronic alcoholic pancreatitis EtOH abuse Transudative pleural effusions Type 2 diabetes mellitus on insulin Homelessness   Hyperglycemia   Tobacco abuse   Acute respiratory failure with hypoxia (HCC)   S/P thoracentesis   Acute on chronic combined systolic and diastolic CHF (congestive heart failure) ([place])   Essential hypertension   Abnormal nuclear stress test   Elevated brain natriuretic peptide (BNP) level   Discharge Condition: Improved  Diet recommendation: Low-sodium, diabetic, heart healthy  Filed Weights  01/04/20 0014 01/05/20 0417 01/06/20 0423 Weight: 54.3 kg 53.3 kg 52.4 kg   History of present illness:  55 yo homeless male with history of alcoholic pancreatitis, htn, DM2 poorly controlled, presented to [facility] 5/22 with CC SOB, cough. In ED, pt hypoxic, SpO2 70s on RA. Initially, placed on NRB , progressive resp distress and hypoxia was intubated in the emergency room.  Hospital Course:   Acute hypoxemic respiratory failure -Acute systolic CHF, EF of 95% lower from prior -Admitted to the ICU with acute hypoxic respiratory failure requiring mechanical intubation, diuresed with IV Lasix, extubated 5/25 -Status post bilateral thoracentesis 5/25, fluid was transudative -Followed by cardiology this admission -Clinically improved with diuresis, work-up noted echo with drop in EF down to 35%, subsequently underwent Lexiscan Myoview which was abnormal -Then had left heart catheterization-noted 70% RCA  disease and otherwise nonobstructive CAD, medical management was recommended -Clinically improved and stable now, transition to oral torsemide and Aldactone, also started on carvedilol, losartan -Discharged back to shelter today -Follow-up arranged with [place] [facility] and [facility] to arrange follow-up as well, needs BMP in 1 week, importance of  compliance with medications, salt restriction, diabetic diet and follow-up emphasized  Chronic acoholic pancreatitis  -Resume Creon  COPD/heavy tobacco abuse -Smoking 3 packs/day, counseled -Nebs PRN  DM2brittle -Brittle diabetic, with significant fluctuations in blood sugars noted -Subsequently transitioned to Levemir 15 units in the morning and 12 at bedtime  Homelessness -Followed by social work inpatient, patient is uninsured without family support, he will be discharged back to the shelter at [place]  Chronic anemia -Stable  H/o HTN -BP stable, monitor   Pt followed closely since that time in [place] [facility]  Wt Readings from Last 3 Encounters: 04/20/20 : 127 lb 9.6 oz (57.9 kg) 04/19/20 : 128 lb (58.1 kg) 01/14/20 : 114 lb (51.7 kg)  On arrival blood sugar is over 200 A1c is 14.9  06/01/2020 This patient on arrival has a blood pressure of 70 and glucose of 104 and later drops to 58.  The patient is quite dehydrated.  He did not eat much for breakfast but took his 5 units of NovoLog.  Patient denies any other GI complaints at this time.  There is no nausea or vomiting.  11/15/20 Patient is seen in return follow-up and is a homeless patient at the [place] clinic.  Patient's had difficulty taking Levemir twice daily and skipping his morning doses frequently because he does not eat many breakfast.  He therefore is only getting 30 units of Levemir daily and will often skip his Humalog doses as well.  On arrival hemoglobin A1c is 10 blood sugar 373  Patient does have a 6 appointment with urology to consider whether he needs to have kidney stones removed patient has no other complaints at this time.  He does have Medicaid but does not have his SSI fully.  He is pondering whether he might be interested in assisted living care.    Past Medical History:  Diagnosis Date  . Alcohol-induced chronic pancreatitis ([place]) 07/22/2013  . Diabetes mellitus   .  Hypertension   . Pancreatitis      Family History  Problem Relation Age of Onset  . Diabetes Mother   . Diabetes Brother      Social History   Socioeconomic History  . Marital status: Single    Spouse name: Not on file  . Number of children: Not on file  . Years of education: Not on file  . Highest education level: Not on file  Occupational History  . Not on file  Tobacco Use  . Smoking status: Current Every Day Smoker    Packs/day: 0.50    Years: 30.00    Pack years: 15.00    Types: Cigarettes  . Smokeless tobacco: Never Used  . Tobacco comment: 6 cigarett /day  Vaping Use  . Vaping Use: Never used  Substance and Sexual Activity  . Alcohol use: No  . Drug use: Not Currently    Types: "Crack" cocaine, Other-see comments, Cocaine    Comment: last smoked crack 12-16-16  . Sexual activity: Never  Other Topics Concern  . Not on file  Social History Narrative  . Not on file   Social Determinants of Health   Financial Resource Strain: Not on  file  Food Insecurity: Not on file  Transportation Needs: Not on file  Physical Activity: Not on file  Stress: Not on file  Social Connections: Not on file  Intimate Partner Violence: Not on file     No Known Allergies   Outpatient Medications Prior to Visit  Medication Sig Dispense Refill  . aspirin 81 MG chewable tablet CHEW 1 TABLET (81 MG TOTAL) BY MOUTH DAILY. 120 tablet 0  . atorvastatin (LIPITOR) 80 MG tablet TAKE 1 TABLET (80 MG TOTAL) BY MOUTH DAILY AT 6 PM. (Patient taking differently: Take by mouth daily. at 6pm) 30 tablet 3  . blood glucose meter kit and supplies KIT Dispense based on patient and insurance preference. Use up to four times daily as directed. (FOR ICD-9 250.00, 250.01). 1 each 0  . carvedilol (COREG) 25 MG tablet TAKE 1 TABLET (25 MG TOTAL) BY MOUTH 2 (TWO) TIMES DAILY WITH A MEAL. 60 tablet 5  . Ferrous Sulfate Dried 200 (65 Fe) MG TABS TAKE 200 MG BY MOUTH 2 (TWO) TIMES DAILY WITH A MEAL. 60 tablet  1  . Glucose 4-6 GM-MG CHEW Chew one as needed for blood glucose < 70 (Patient taking differently: Chew 1 tablet by mouth 2 (two) times daily as needed (low glucose). Chew one as needed for blood glucose < 70) 100 tablet 0  . ibuprofen (ADVIL) 200 MG tablet Take 400 mg by mouth every 6 (six) hours as needed for fever, headache or mild pain.    Marland Kitchen insulin lispro (HUMALOG) 100 UNIT/ML KwikPen INJECT 8 UNITS INTO THE SKIN 2 (TWO) TIMES DAILY WITH A MEAL. 6 mL 1  . Insulin Pen Needle 31G X 5 MM MISC USE WITH INSULIN PENS 100 each 1  . losartan (COZAAR) 100 MG tablet TAKE 1 TABLET (100 MG TOTAL) BY MOUTH DAILY. 30 tablet 3  . Nirmatrelvir & Ritonavir 20 x 150 MG & 10 x 100MG TBPK TAKE 3 TABLETS BY MOUTH 2 (TWO) TIMES DAILY FOR 5 DAYS. 30 each 0  . nystatin cream (MYCOSTATIN) APPLY 1 APPLICATION TOPICALLY 2 (TWO) TIMES DAILY. 30 g 0  . Pancrelipase, Lip-Prot-Amyl, 6000-19000 units CPEP TAKE 2 CAPSULES (12,000 UNITS TOTAL) BY MOUTH IN THE MORNING, AT NOON, AND AT BEDTIME. 360 capsule 0  . pantoprazole (PROTONIX) 40 MG tablet TAKE 1 TABLET (40 MG TOTAL) BY MOUTH DAILY. 30 tablet 3  . spironolactone (ALDACTONE) 25 MG tablet TAKE 1 TABLET (25 MG TOTAL) BY MOUTH DAILY. 60 tablet 0  . torsemide (DEMADEX) 20 MG tablet TAKE 1 TABLET (20 MG TOTAL) BY MOUTH DAILY. 60 tablet 5  . cephALEXin (KEFLEX) 500 MG capsule TAKE 1 CAPSULE (500 MG TOTAL) BY MOUTH 4 (FOUR) TIMES DAILY FOR 7 DAYS. 28 capsule 0  . cephALEXin (KEFLEX) 500 MG capsule TAKE 1 CAPSULE BY MOUTH 4 TIMES DAILY FOR 10 DAYS. 40 capsule 0  . insulin aspart (NOVOLOG) 100 UNIT/ML FlexPen INJECT 5 UNITS INTO THE SKIN 2 (TWO) TIMES DAILY WITH A MEAL. 6 mL 1  . insulin detemir (LEVEMIR FLEXTOUCH) 100 UNIT/ML FlexPen 30  units AM and 30 units PM 50 mL 1  . insulin detemir (LEVEMIR) 100 UNIT/ML FlexPen INJECT 30 UNITS EVERY MORNING AND 30 UNITS EVERY EVENING 50 mL 1  . insulin detemir (LEVEMIR) 100 UNIT/ML FlexPen INJECT 30 UNITS IN THE AM AND 30 UNITS IN THE PM 50  mL 1  . insulin detemir (LEVEMIR) 100 UNIT/ML FlexPen INJECT 25 UNITS IN THE MORNING AND 25 UNITS IN THE EVENING  50 mL 1  . insulin lispro (HUMALOG) 100 UNIT/ML KwikPen Inject 8 Units into the skin 2 (two) times daily with a meal. 6 mL 1  . insulin lispro (HUMALOG) 100 UNIT/ML KwikPen INJECT 8 UNITS INTO THE SKIN 2 (TWO) TIMES DAILY WITH A MEAL. 6 mL 1  . insulin lispro (HUMALOG) 100 UNIT/ML KwikPen INJECT 8 UNITS INTO THE SKIN 2 (TWO) TIMES DAILY WITH A MEAL. 6 mL 1  . insulin lispro (HUMALOG) 100 UNIT/ML KwikPen INJECT 5 UNITS 2 TIMES DAILY WITH MEALS 3 mL 1  . Insulin Pen Needle 29G X 5MM MISC Use with insulin pens 100 each 1  . Insulin Pen Needle 31G X 5 MM MISC USE AS DIRECTED WITH INSULIN PENS. 100 each 1  . Insulin Pen Needle 31G X 5 MM MISC USE AS DIRECTED 100 each 1  . Insulin Pen Needle 31G X 5 MM MISC USE WITH INSULIN PENS 100 each 1  . Insulin Pen Needle 31G X 5 MM MISC USE WITH INSULIN PENS 100 each 1  . losartan (COZAAR) 100 MG tablet Take 1 tablet (100 mg total) by mouth daily. 30 tablet 3  . losartan (COZAAR) 100 MG tablet TAKE 1 TABLET (100 MG TOTAL) BY MOUTH DAILY. 30 tablet 3  . losartan (COZAAR) 100 MG tablet TAKE 1 TABLET (100 MG TOTAL) BY MOUTH DAILY. 30 tablet 3  . losartan (COZAAR) 100 MG tablet TAKE 1 TABLET (100 MG TOTAL) BY MOUTH DAILY. 30 tablet 3  . losartan (COZAAR) 100 MG tablet TAKE 1 TABLET (100 MG TOTAL) BY MOUTH DAILY. 30 tablet 3  . losartan (COZAAR) 100 MG tablet TAKE 1 TABLET (100 MG TOTAL) BY MOUTH DAILY. 30 tablet 3  . nystatin cream (MYCOSTATIN) Apply 1 application topically 2 (two) times daily. 30 g 0  . ondansetron (ZOFRAN ODT) 4 MG disintegrating tablet Take 1 tablet (4 mg total) by mouth every 8 (eight) hours as needed for nausea or vomiting. 10 tablet 0  . ondansetron (ZOFRAN-ODT) 4 MG disintegrating tablet DISSOLVE 1 TABLET BY MOUTH EVERY 8 HOURS AS NEEDED FOR NAUSEA OR VOMITING. 10 tablet 0  . Pancrelipase, Lip-Prot-Amyl, 6000-19000 units CPEP TAKE  2 CAPSULES (12,000 UNITS TOTAL) BY MOUTH IN THE MORNING, AT NOON, AND AT BEDTIME. 360 capsule 0  . pantoprazole (PROTONIX) 40 MG tablet Take 1 tablet (40 mg total) by mouth daily. 30 tablet 3  . pantoprazole (PROTONIX) 40 MG tablet TAKE 1 TABLET (40 MG TOTAL) BY MOUTH DAILY. 30 tablet 3  . pantoprazole (PROTONIX) 40 MG tablet TAKE 1 TABLET (40 MG TOTAL) BY MOUTH DAILY. 30 tablet 3  . pantoprazole (PROTONIX) 40 MG tablet TAKE 1 TABLET (40 MG TOTAL) BY MOUTH DAILY. 30 tablet 3  . pantoprazole (PROTONIX) 40 MG tablet TAKE 1 TABLET (40 MG TOTAL) BY MOUTH DAILY. 30 tablet 3  . pantoprazole (PROTONIX) 40 MG tablet TAKE 1 TABLET (40 MG TOTAL) BY MOUTH DAILY. 30 tablet 3  . pantoprazole (PROTONIX) 40 MG tablet TAKE 1 TABLET (40 MG TOTAL) BY MOUTH DAILY. 30 tablet 3  . spironolactone (ALDACTONE) 25 MG tablet Take 1 tablet (25 mg total) by mouth daily. 60 tablet 0  . spironolactone (ALDACTONE) 25 MG tablet TAKE 1 TABLET (25 MG TOTAL) BY MOUTH DAILY. 60 tablet 0  . spironolactone (ALDACTONE) 25 MG tablet TAKE 1 TABLET (25 MG TOTAL) BY MOUTH DAILY. 60 tablet 0  . spironolactone (ALDACTONE) 25 MG tablet TAKE 1 TABLET (25 MG TOTAL) BY MOUTH DAILY. 60 tablet 0  .  spironolactone (ALDACTONE) 25 MG tablet TAKE 1 TABLET (25 MG TOTAL) BY MOUTH DAILY. 30 tablet 2  . sulfamethoxazole-trimethoprim (BACTRIM DS) 800-160 MG tablet TAKE 1 TABLET BY MOUTH TWICE DAILY. 14 tablet 0  . torsemide (DEMADEX) 20 MG tablet Take 1 tablet (20 mg total) by mouth daily. 60 tablet 5  . torsemide (DEMADEX) 20 MG tablet TAKE 1 TABLET (20 MG TOTAL) BY MOUTH DAILY. 60 tablet 5  . torsemide (DEMADEX) 20 MG tablet TAKE 1 TABLET (20 MG TOTAL) BY MOUTH DAILY. 60 tablet 0  . torsemide (DEMADEX) 20 MG tablet TAKE 1 TABLET (20 MG TOTAL) BY MOUTH DAILY. 60 tablet 0  . torsemide (DEMADEX) 20 MG tablet TAKE 1 TABLET (20 MG TOTAL) BY MOUTH DAILY AS NEEDED FOR SWELLING IN LEGS 90 tablet 0  . feeding supplement, GLUCERNA SHAKE, (GLUCERNA SHAKE) LIQD Take  237 mLs by mouth 3 (three) times daily between meals. (Patient not taking: No sig reported)  0  . aspirin 81 MG chewable tablet Chew 1 tablet (81 mg total) by mouth daily. 120 tablet 0  . atorvastatin (LIPITOR) 80 MG tablet Take 1 tablet (80 mg total) by mouth daily at 6 PM. 30 tablet 3  . atorvastatin (LIPITOR) 80 MG tablet TAKE 1 TABLET (80 MG TOTAL) BY MOUTH DAILY AT 6 PM. 30 tablet 3  . atorvastatin (LIPITOR) 80 MG tablet TAKE 1 TABLET (80 MG TOTAL) BY MOUTH DAILY AT 6 PM. 30 tablet 3  . atorvastatin (LIPITOR) 80 MG tablet TAKE 1 TABLET (80 MG TOTAL) BY MOUTH DAILY AT 6 PM. 30 tablet 3  . atorvastatin (LIPITOR) 80 MG tablet TAKE 1 TABLET (80 MG TOTAL) BY MOUTH DAILY AT 6 PM. 30 tablet 3  . atorvastatin (LIPITOR) 80 MG tablet TAKE 1 TABLET (80 MG TOTAL) BY MOUTH DAILY AT 6 PM. 30 tablet 3  . carvedilol (COREG) 25 MG tablet Take 1 tablet (25 mg total) by mouth 2 (two) times daily with a meal. 60 tablet 5  . carvedilol (COREG) 25 MG tablet TAKE 1 TABLET (25 MG TOTAL) BY MOUTH 2 (TWO) TIMES DAILY WITH A MEAL. 60 tablet 5  . carvedilol (COREG) 25 MG tablet TAKE 1 TABLET (25 MG TOTAL) BY MOUTH 2 (TWO) TIMES DAILY WITH A MEAL. 60 tablet 0  . carvedilol (COREG) 25 MG tablet TAKE 1 TABLET (25 MG TOTAL) BY MOUTH 2 (TWO) TIMES DAILY WITH A MEAL. 60 tablet 0  . carvedilol (COREG) 25 MG tablet TAKE 1 TABLET (25 MG TOTAL) BY MOUTH 2 (TWO) TIMES DAILY WITH A MEAL. 60 tablet 0  . carvedilol (COREG) 25 MG tablet TAKE 1 TABLET (25 MG TOTAL) BY MOUTH 2 (TWO) TIMES DAILY WITH A MEAL. 60 tablet 0  . ferrous sulfate 325 (65 FE) MG tablet TAKE 1 TABLET BY MOUTH 2 (TWO) TIMES DAILY WITH A MEAL. 60 tablet 3  . ferrous sulfate 325 (65 FE) MG tablet TAKE 1 TABLET BY MOUTH 2 (TWO) TIMES DAILY WITH A MEAL. 100 tablet 3  . ferrous sulfate 325 (65 FE) MG tablet TAKE 1 TABLET BY MOUTH 2 (TWO) TIMES DAILY WITH A MEAL. 100 tablet 3  . Ferrous Sulfate Dried (FEOSOL) 200 (65 Fe) MG TABS Take 200 mg by mouth 2 (two) times daily  with a meal. 60 tablet 1  . Ferrous Sulfate Dried 200 (65 Fe) MG TABS TAKE 1 TABLET BY MOUTH 2 (TWO) TIMES DAILY WITH A MEAL. 60 tablet 3   Facility-Administered Medications Prior to Visit  Medication Dose  Route Frequency Provider Last Rate Last Admin  . Insta-Glucose 77.4 % GEL 1 Tube  1 Tube Oral Once Elsie Stain, MD          Review of Systems  Constitutional: Positive for activity change, fatigue and unexpected weight change. Negative for appetite change.  HENT: Positive for dental problem. Negative for sinus pressure and sinus pain.   Respiratory: Negative.   Cardiovascular: Negative for chest pain, palpitations and leg swelling.  Gastrointestinal: Positive for constipation. Negative for diarrhea, nausea, rectal pain and vomiting.  Endocrine: Negative for polydipsia and polyuria.  Genitourinary: Negative for enuresis and frequency.  Musculoskeletal: Negative.   Neurological: Positive for weakness. Negative for light-headedness.  Hematological: Negative.   Psychiatric/Behavioral: Negative.        Objective:   Physical Exam Vitals:   11/15/20 1355  BP: 116/60  Pulse: 73  SpO2: 98%  Weight: 139 lb 12.8 oz (63.4 kg)  Height: 5' 7"  (1.702 m)   Repeat blood pressures are improved after 1 L of saline given Gen: Pleasant, cachectic, in no distress,  normal affect  ENT: No lesions,  mouth clear,  oropharynx clear, no postnasal drip  Neck: No JVD, no TMG, no carotid bruits  Lungs: No use of accessory muscles, no dullness to percussion, clear without rales or rhonchi  Cardiovascular: RRR, heart sounds normal, no murmur or gallops, no peripheral edema  Abdomen: soft and NT, no HSM,  BS normal  Musculoskeletal: No deformities, no cyanosis or clubbing  Neuro: alert, non focal  Skin: Warm, no lesions or rashes   CBG 373  Hemoglobin A1C  Date Value Ref Range Status  04/20/2020 14.5 (A) 4.0 - 5.6 % Final   HbA1c, POC (controlled diabetic range)  Date Value Ref  Range Status  11/15/2020 9.6 (A) 0.0 - 7.0 % Final   Hgb A1c MFr Bld  Date Value Ref Range Status  06/01/2020 12.3 (H) 4.8 - 5.6 % Final    Comment:             Prediabetes: 5.7 - 6.4          Diabetes: >6.4          Glycemic control for adults with diabetes: <7.0        Assessment & Plan:  I personally reviewed all images and lab data in the [facility] system as well as any outside material available during this office visit and agree with the  radiology impressions.   Essential hypertension Hypertension at goal no change in medications at this time  Chronic combined systolic (congestive) and diastolic (congestive) heart failure (HCC) Compensated heart failure no change in Aldactone dosing or torsemide dosing continue Coreg as prescribed  Uncontrolled type 2 diabetes mellitus ([place]) Difficult to control due to dietary intake imbalance  Change Levemir to once daily Lantus insulin glargine 60 units in the morning  Continue to cover with Humalog 8 units breakfast and dinner  Hyperlipidemia associated with type 2 diabetes mellitus ([place]) Check lipid panel and continue statin   Rodney Barber was seen today for diabetes.  Diagnoses and all orders for this visit:  Uncontrolled diabetes mellitus secondary to pancreatic insufficiency (HCC) -     POCT glucose (manual entry) -     POCT glycosylated hemoglobin (Hb A1C) -     Comprehensive metabolic panel -     Lipid panel  Essential hypertension -     CBC with Differential/Platelet  Chronic combined systolic (congestive) and diastolic (congestive) heart failure (HCC)  Uncontrolled  type 2 diabetes mellitus with hyperglycemia ([place])  Hyperlipidemia associated with type 2 diabetes mellitus ([place])  Other orders -     insulin glargine (LANTUS) 100 UNIT/ML Solostar Pen; Inject 60 Units into the skin daily.  Recommended patient to get a COVID booster  45 minutes spent conferring with case management clinical pharmacist reviewing records  formulating plan complex medical decision making

## 2020-11-15 ENCOUNTER — Ambulatory Visit: Payer: Self-pay | Attending: Critical Care Medicine | Admitting: Critical Care Medicine

## 2020-11-15 ENCOUNTER — Telehealth: Payer: Self-pay

## 2020-11-15 ENCOUNTER — Other Ambulatory Visit: Payer: Self-pay

## 2020-11-15 ENCOUNTER — Encounter: Payer: Self-pay | Admitting: Critical Care Medicine

## 2020-11-15 VITALS — BP 116/60 | HR 73 | Ht 67.0 in | Wt 139.8 lb

## 2020-11-15 DIAGNOSIS — I5042 Chronic combined systolic (congestive) and diastolic (congestive) heart failure: Secondary | ICD-10-CM

## 2020-11-15 DIAGNOSIS — IMO0002 Reserved for concepts with insufficient information to code with codable children: Secondary | ICD-10-CM

## 2020-11-15 DIAGNOSIS — I10 Essential (primary) hypertension: Secondary | ICD-10-CM

## 2020-11-15 DIAGNOSIS — K8689 Other specified diseases of pancreas: Secondary | ICD-10-CM

## 2020-11-15 DIAGNOSIS — E1169 Type 2 diabetes mellitus with other specified complication: Secondary | ICD-10-CM

## 2020-11-15 DIAGNOSIS — E0865 Diabetes mellitus due to underlying condition with hyperglycemia: Secondary | ICD-10-CM

## 2020-11-15 DIAGNOSIS — E785 Hyperlipidemia, unspecified: Secondary | ICD-10-CM

## 2020-11-15 DIAGNOSIS — E1165 Type 2 diabetes mellitus with hyperglycemia: Secondary | ICD-10-CM

## 2020-11-15 LAB — POCT GLYCOSYLATED HEMOGLOBIN (HGB A1C): HbA1c, POC (controlled diabetic range): 9.6 % — AB (ref 0.0–7.0)

## 2020-11-15 LAB — GLUCOSE, POCT (MANUAL RESULT ENTRY): POC Glucose: 373 mg/dl — AB (ref 70–99)

## 2020-11-15 MED ORDER — INSULIN GLARGINE 100 UNIT/ML SOLOSTAR PEN
60.0000 [IU] | PEN_INJECTOR | Freq: Every day | SUBCUTANEOUS | 11 refills | Status: DC
  Filled 2020-11-15: qty 15, 25d supply, fill #0

## 2020-11-15 NOTE — Assessment & Plan Note (Signed)
Difficult to control due to dietary intake imbalance  Change Levemir to once daily Lantus insulin glargine 60 units in the morning  Continue to cover with Humalog 8 units breakfast and dinner

## 2020-11-15 NOTE — Assessment & Plan Note (Signed)
Hypertension at goal no change in medications at this time

## 2020-11-15 NOTE — Patient Instructions (Signed)
Stop Levemir  Begin Lantus 60 units in the morning daily, this was sent to our pharmacy here at [facility] and wellness clinic please pick this up today  Stay on Humalog 8 units twice a day for breakfast and lunch  No other medication changes  Keep your upcoming appointment with urology made this 6 please determine what time that appointment is  Stop by the lab to get a complete set of blood draws today  Return to see Rodney Barber our clinical pharmacist in 2 weeks and Rodney Barber here in the clinic in 4 months but we will continue to see you weekly at the shelter clinic  I am working with case management to confirm your Medicaid status to see if we can apply you for an assisted living placement

## 2020-11-15 NOTE — Assessment & Plan Note (Signed)
Check lipid panel and continue statin.

## 2020-11-15 NOTE — Telephone Encounter (Signed)
Met with the patient when he was in the clinic. He had a medicaid card and also a letter for denial from DSS for his [place] medicaid.   Camillia Herter Referral Specialist was able to verify in [place] that the medicaid is active.  The patient has been working with [employer] to submit a disability application.  This CM spoke to [facility] who confirmed that the denial letter is regarding his SSI application, not the straight medicaid.  Chelsea noted that Margarita Grizzle is his caseworker at [facility] and has a telephone appointment with him on 11/25/2020 to discuss appealing the denial.   The information from [place] was shared with the patient and he said he is aware of the meeting on 11/25/2020.   He is currently at [facility] and would like permanent housing but he has no income.  This CM explained that he will need to have income for an apartment and this CM will check with Glenard Haring and Marya Amsler at [employer] to inquire about any plan for permanent housing.  This CM stressed with the patient the need to keep the appointment with [facility] on 11/25/2020.   Regarding ALF, he said that he needs help with medications but not bathing, dressing. He is using a rollator with ambulation but is independent getting around. Currently uses the city bus to get to appointments. This CM explained that if he were to be accepted at an ALF without an income, when he does start to receive an income, that money , except for $30-60 would be turned over to the facility to pay for his care.  He said he really would like his own apartment and would need to think about if he wanted ALF if he qualified.

## 2020-11-15 NOTE — Assessment & Plan Note (Signed)
Compensated heart failure no change in Aldactone dosing or torsemide dosing continue Coreg as prescribed

## 2020-11-16 ENCOUNTER — Ambulatory Visit: Payer: Self-pay | Admitting: Physician Assistant

## 2020-11-16 LAB — CBC WITH DIFFERENTIAL/PLATELET
Basophils Absolute: 0.1 10*3/uL (ref 0.0–0.2)
Basos: 1 %
EOS (ABSOLUTE): 0.6 10*3/uL — ABNORMAL HIGH (ref 0.0–0.4)
Eos: 7 %
Hematocrit: 29.2 % — ABNORMAL LOW (ref 37.5–51.0)
Hemoglobin: 9.3 g/dL — ABNORMAL LOW (ref 13.0–17.7)
Immature Grans (Abs): 0 10*3/uL (ref 0.0–0.1)
Immature Granulocytes: 0 %
Lymphocytes Absolute: 1.9 10*3/uL (ref 0.7–3.1)
Lymphs: 24 %
MCH: 29.6 pg (ref 26.6–33.0)
MCHC: 31.8 g/dL (ref 31.5–35.7)
MCV: 93 fL (ref 79–97)
Monocytes Absolute: 0.6 10*3/uL (ref 0.1–0.9)
Monocytes: 7 %
Neutrophils Absolute: 4.8 10*3/uL (ref 1.4–7.0)
Neutrophils: 61 %
Platelets: 203 10*3/uL (ref 150–450)
RBC: 3.14 x10E6/uL — ABNORMAL LOW (ref 4.14–5.80)
RDW: 13.1 % (ref 11.6–15.4)
WBC: 8 10*3/uL (ref 3.4–10.8)

## 2020-11-16 LAB — COMPREHENSIVE METABOLIC PANEL
ALT: 17 IU/L (ref 0–44)
AST: 16 IU/L (ref 0–40)
Albumin/Globulin Ratio: 1.6 (ref 1.2–2.2)
Albumin: 4 g/dL (ref 3.8–4.9)
Alkaline Phosphatase: 92 IU/L (ref 44–121)
BUN/Creatinine Ratio: 17 (ref 9–20)
BUN: 25 mg/dL — ABNORMAL HIGH (ref 6–24)
Bilirubin Total: <0.2 mg/dL (ref 0.0–1.2)
CO2: 25 mmol/L (ref 20–29)
Calcium: 8.9 mg/dL (ref 8.7–10.2)
Chloride: 104 mmol/L (ref 96–106)
Creatinine, Ser: 1.51 mg/dL — ABNORMAL HIGH (ref 0.76–1.27)
Globulin, Total: 2.5 g/dL (ref 1.5–4.5)
Glucose: 299 mg/dL — ABNORMAL HIGH (ref 65–99)
Potassium: 4.5 mmol/L (ref 3.5–5.2)
Sodium: 143 mmol/L (ref 134–144)
Total Protein: 6.5 g/dL (ref 6.0–8.5)
eGFR: 54 mL/min/{1.73_m2} — ABNORMAL LOW (ref >59)

## 2020-11-16 LAB — LIPID PANEL
Chol/HDL Ratio: 3.1 ratio (ref 0.0–5.0)
Cholesterol, Total: 111 mg/dL (ref 100–199)
HDL: 36 mg/dL — ABNORMAL LOW (ref >39)
LDL Chol Calc (NIH): 59 mg/dL (ref 0–99)
Triglycerides: 79 mg/dL (ref 0–149)
VLDL Cholesterol Cal: 16 mg/dL (ref 5–40)

## 2020-11-17 ENCOUNTER — Encounter: Payer: Self-pay | Admitting: Critical Care Medicine

## 2020-11-17 ENCOUNTER — Telehealth: Payer: Self-pay

## 2020-11-17 NOTE — Telephone Encounter (Signed)
Call placed to [facility]/Director - [facility] regarding permanent housing for patient. She explained that the patient would benefit from ALF. They are providing supportive care at the shelter and he needs ongoing cuing and reminders when to take medications. She is concerned about his inability to manage his medical needs.  He does not have an income at this time and she said that she will start the application process for special assistance medicaid for him for facility placement

## 2020-11-18 ENCOUNTER — Other Ambulatory Visit: Payer: Self-pay

## 2020-11-19 NOTE — Progress Notes (Signed)
Pt seen at shelter clinic.  Pt upset his SSI did not go through for disability,  He does have Medicaid for health care.  Has all meds and has yet to pick up his insulin.  Went over all his lab results from prior visits

## 2020-11-25 ENCOUNTER — Other Ambulatory Visit (HOSPITAL_COMMUNITY): Payer: Self-pay

## 2020-11-25 ENCOUNTER — Encounter: Payer: Self-pay | Admitting: *Deleted

## 2020-11-25 ENCOUNTER — Other Ambulatory Visit: Payer: Self-pay | Admitting: *Deleted

## 2020-11-25 ENCOUNTER — Other Ambulatory Visit: Payer: Self-pay

## 2020-11-25 MED ORDER — ASPIRIN 81 MG PO CHEW
CHEWABLE_TABLET | Freq: Every day | ORAL | 0 refills | Status: DC
  Filled 2020-11-25: qty 120, fill #0
  Filled 2021-01-20: qty 36, 36d supply, fill #0

## 2020-11-25 MED ORDER — PANTOPRAZOLE SODIUM 40 MG PO TBEC
DELAYED_RELEASE_TABLET | Freq: Every day | ORAL | 3 refills | Status: DC
  Filled 2020-11-25: qty 30, 30d supply, fill #0

## 2020-11-25 MED ORDER — TORSEMIDE 20 MG PO TABS
ORAL_TABLET | Freq: Every day | ORAL | 5 refills | Status: DC
  Filled 2020-11-25: qty 30, 30d supply, fill #0
  Filled 2020-12-23: qty 30, 30d supply, fill #1

## 2020-11-25 NOTE — Congregational Nurse Program (Signed)
  Dept: [phone]   Congregational Nurse Program Note  Date of Encounter: 11/25/2020  Past Medical History: Past Medical History:  Diagnosis Date  . 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 Diabetes mellitus   . Hypertension   . Pancreatitis     Encounter Details:  CNP Questionnaire - 11/25/20 1704      Questionnaire   Referrals Medication Assistance         prepared med box, pt checked his own cbg, 193, he still does not feel comfortable taking 60 units lantus, he stated he would take 40 and consider 50 tomorrow, explained that it is his right to do that but his blood sugars may improve greatly if he took 60 units. Also called and made urology appt for 5/25 at 1015, reminded him of upcoming appts w/ [facility] and cardiology, gave 2 bus passes for tues to [facility]. Will speak to someone about medicaid transportation

## 2020-11-26 ENCOUNTER — Encounter: Payer: Self-pay | Admitting: Physician Assistant

## 2020-11-26 NOTE — Progress Notes (Signed)
PCP: [name], MD PCP Cardiology: [name], MD  Rash is better, wants more cream.  Areas on torso are lighter in color and look a little smaller. However, still have sandpaper appearance. ?Lichen chronicus  Cera-Ve diabetic cream given. If sx do not improve, may need steroids.  Still has pain from kidney stones, mostly early am. Thought he had appt w/ Urology, but Myriam Jacobson called and he does not. She will help with this. ER consulted Dr Hillis Range by phone on 03/03.   He has appt w/ Dr Swaziland on 05/06, reiterated the importance of keeping this. He will need cardiac clearance for whatever procedures Urology wants to do.  Has appt w/ PCP on 04/26, Myriam Jacobson will make sure he is reminded of this.   C/o pain in his legs daily, says they get weak w/ ambulation. Checked pulses, 2+ bilaterally. Reviewed Doppler results from 06/2020. Advised that large vessels are widely patent, but has some distal disease. Do not think sx including generalized leg weakness and resting pain are 2nd to this.   Explained that he is at high risk for neuropathy with long hx poor diabetes control.  He has been reluctant to increase insulin up to 80 U qd as directed. He is currently taking 60 U qd. He  checked CBG and it was 233. He admitted to drinking part of a soda.  Advised that we would like his CBG to be 100 pts less. Requested he increase his insulin to 70 U qd, we settled on 65.  Theodore Demark, PA-C

## 2020-11-28 ENCOUNTER — Emergency Department (HOSPITAL_COMMUNITY)
Admission: EM | Admit: 2020-11-28 | Discharge: 2020-11-28 | Disposition: A | Discharge status: Home / Self Care | Payer: Medicaid Other | Attending: Emergency Medicine | Admitting: Emergency Medicine

## 2020-11-28 ENCOUNTER — Other Ambulatory Visit: Payer: Self-pay

## 2020-11-28 DIAGNOSIS — Z79899 Other long term (current) drug therapy: Secondary | ICD-10-CM | POA: Diagnosis not present

## 2020-11-28 DIAGNOSIS — I110 Hypertensive heart disease with heart failure: Secondary | ICD-10-CM | POA: Insufficient documentation

## 2020-11-28 DIAGNOSIS — R464 Slowness and poor responsiveness: Secondary | ICD-10-CM | POA: Diagnosis present

## 2020-11-28 DIAGNOSIS — I2510 Atherosclerotic heart disease of native coronary artery without angina pectoris: Secondary | ICD-10-CM | POA: Diagnosis not present

## 2020-11-28 DIAGNOSIS — E119 Type 2 diabetes mellitus without complications: Secondary | ICD-10-CM | POA: Diagnosis not present

## 2020-11-28 DIAGNOSIS — E162 Hypoglycemia, unspecified: Secondary | ICD-10-CM | POA: Diagnosis not present

## 2020-11-28 DIAGNOSIS — Z7982 Long term (current) use of aspirin: Secondary | ICD-10-CM | POA: Diagnosis not present

## 2020-11-28 DIAGNOSIS — F17210 Nicotine dependence, cigarettes, uncomplicated: Secondary | ICD-10-CM | POA: Insufficient documentation

## 2020-11-28 DIAGNOSIS — Z794 Long term (current) use of insulin: Secondary | ICD-10-CM | POA: Insufficient documentation

## 2020-11-28 DIAGNOSIS — I5042 Chronic combined systolic (congestive) and diastolic (congestive) heart failure: Secondary | ICD-10-CM | POA: Diagnosis not present

## 2020-11-28 LAB — COMPREHENSIVE METABOLIC PANEL
ALT: 18 U/L (ref 0–44)
AST: 14 U/L — ABNORMAL LOW (ref 15–41)
Albumin: 3.9 g/dL (ref 3.5–5.0)
Alkaline Phosphatase: 68 U/L (ref 38–126)
Anion gap: 6 (ref 5–15)
BUN: 32 mg/dL — ABNORMAL HIGH (ref 6–20)
CO2: 26 mmol/L (ref 22–32)
Calcium: 9 mg/dL (ref 8.9–10.3)
Chloride: 106 mmol/L (ref 98–111)
Creatinine, Ser: 1.34 mg/dL — ABNORMAL HIGH (ref 0.61–1.24)
GFR, Estimated: >60 mL/min (ref >60)
Glucose, Bld: 199 mg/dL — ABNORMAL HIGH (ref 70–99)
Potassium: 3.7 mmol/L (ref 3.5–5.1)
Sodium: 138 mmol/L (ref 135–145)
Total Bilirubin: 0.4 mg/dL (ref 0.3–1.2)
Total Protein: 7.4 g/dL (ref 6.5–8.1)

## 2020-11-28 LAB — CBC
HCT: 30.9 % — ABNORMAL LOW (ref 39.0–52.0)
Hemoglobin: 9.9 g/dL — ABNORMAL LOW (ref 13.0–17.0)
MCH: 30.1 pg (ref 26.0–34.0)
MCHC: 32 g/dL (ref 30.0–36.0)
MCV: 93.9 fL (ref 80.0–100.0)
Platelets: 204 10*3/uL (ref 150–400)
RBC: 3.29 MIL/uL — ABNORMAL LOW (ref 4.22–5.81)
RDW: 13.6 % (ref 11.5–15.5)
WBC: 14.4 10*3/uL — ABNORMAL HIGH (ref 4.0–10.5)
nRBC: 0 % (ref 0.0–0.2)

## 2020-11-28 LAB — CBG MONITORING, ED
Glucose-Capillary: 208 mg/dL — ABNORMAL HIGH (ref 70–99)
Glucose-Capillary: 185 mg/dL — ABNORMAL HIGH (ref 70–99)

## 2020-11-28 NOTE — ED Notes (Signed)
Discharge instructions reviewed with pt. Pt verbalized understanding of decreasing his Lantus dose to 45 units instead of the 60 units and importance of following up with Dr.Wright.

## 2020-11-28 NOTE — Discharge Instructions (Signed)
As discussed with the diabetes nurse, please take 45 units of your Lantus as opposed to 60 that you are taking.  Please make sure to follow-up with Dr. [name].  Return to the ER for any new or worsening symptoms.

## 2020-11-28 NOTE — Progress Notes (Signed)
Inpatient Diabetes Program Recommendations  AACE/ADA: New Consensus Statement on Inpatient Glycemic Control (2015)  Target Ranges:  Prepandial:   less than 140 mg/dL      Peak postprandial:   less than 180 mg/dL (1-2 hours)      Critically ill patients:  140 - 180 mg/dL   Lab Results  Component Value Date   GLUCAP 185 (H) 11/28/2020   HGBA1C 9.6 (A) 11/15/2020    Review of Glycemic Control  Diabetes history: DM type 2 Outpatient Diabetes medications: Lantus 60 units daily, Novolog 8 units bid (breakfast and dinner time) Current orders for Inpatient glycemic control:  In ED  Stays at [facility] PCP Dr. [name] , [facility] Is followed by congregational nursing for community resources.  Was on Levemir 30 units bid was changed to Lantus 60 units Daily on 4/11  Pt had reported to congregational nurse he was uncomfortable taking the 60 units on 4/21. He received 2 bus passes for appt. His medication box was prepared and insulin dose discussed at that time. Pt was only taking 40 units of the Lantus and had plans to titrate up  Spoke with pt over the phone regarding hypoglycemia episode and regimen for home. Pt reports taking between 40-50 units at home but has not done 50 units in awhile. Pt repots glucose 400's and that is why he took the 60 units instead of 40.  Pt reports glucose looked good on the Levemir unsure why he was switched. Pt does report eating candy being elevated and Dr. [name] decided to switch insulins. Been on Lantus for 1-1.5 years.  A1c 9.6% on 4/11  Inpatient Diabetes Program Recommendations:    Reduce Lantus to 80% to 45 units Daily.   Follow up with Dr. [name] (has a clinic every Wednesday that the pt goes to and see).  Thanks,  Christena Deem RN, MSN, BC-ADM Inpatient Diabetes Coordinator Team Pager [phone] (8a-5p)

## 2020-11-28 NOTE — ED Triage Notes (Signed)
Pt BIB [facility] from the [facility] with c/o hypoglycemia. CBG was 30 .  First reponders had given 15 oral glucose. EMS arrived pt was A & O x2 and 1 mg Glucagon was given with no improvement.  Pt then became completely unresponsive and 10g D10 was given with immediate improvement. Pt is now A&O x4.   CBG 206 BP- 176/100 P- 62 100% o2 room air.  20 IV rt forearm.

## 2020-11-28 NOTE — ED Provider Notes (Signed)
[facility]-EMERGENCY DEPT Provider Note   CSN: [id number] Arrival date & time: 11/28/20  0913     History Chief Complaint  Patient presents with  . Hypoglycemia    Jaydon Mahrt is a 55 y.o. male.  HPI 55-year-old male with a history of DM type II, alcohol and pancreatitis, hypertension presents to the ER with complaints of hypoglycemia.  Patient reports waking up in the morning, getting dressed, and then reportedly "blacking out".  Patient lives at [facility], EMS was called and found the patient to be hypoglycemic with a CBG of 30 this morning.  He was given 15 mg of oral glucose.  EMS stated he was about alert and oriented x2, was then given 1 mg of glucagon with no improvement.  He became completely unresponsive and was given 10 g of D10 with immediate improvement.  Patient is currently alert and oriented x4, and able to answer questions appropriately.  States he feels well, asking for a sandwich.  Reports that he was recently started on Lantus 60 units by Dr. Wright.     Past Medical History:  Diagnosis Date  . 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 Diabetes mellitus   . Hypertension   . Pancreatitis     Patient Active Problem List   Diagnosis Date Noted  . Coronary artery disease 04/20/2020  . S/P thoracentesis   . Chronic combined systolic (congestive) and diastolic (congestive) heart failure (HCC)   . Essential hypertension   . Tobacco abuse 05/14/2019  . Substance use disorder 02/18/2019  . Uncontrolled type 2 diabetes mellitus (HCC) 03/15/2018  . Hyperlipidemia associated with type 2 diabetes mellitus (HCC) 09/08/2013  . Severe malnutrition (HCC) 07/11/2013  . Protein-calorie malnutrition, severe (HCC) 07/11/2013    Past Surgical History:  Procedure Laterality Date  . ERCP  06/27/2011   Procedure: ENDOSCOPIC RETROGRADE CHOLANGIOPANCREATOGRAPHY (ERCP);  Surgeon: Marc E Magod, MD;  Location: [facility] ENDOSCOPY;  Service:  Endoscopy;  Laterality: N/A;  . ERCP W/ PLASTIC STENT PLACEMENT  03/2011  . IR US GUIDE BX ASP/DRAIN  05/16/2019  . LEFT HEART CATH AND CORONARY ANGIOGRAPHY N/A 01/02/2020   Procedure: LEFT HEART CATH AND CORONARY ANGIOGRAPHY;  Surgeon: Jordan, Peter M, MD;  Location: [facility] INVASIVE CV LAB;  Service: Cardiovascular;  Laterality: N/A;  . TEE WITHOUT CARDIOVERSION N/A 05/20/2019   Procedure: TRANSESOPHAGEAL ECHOCARDIOGRAM (TEE);  Surgeon: Hilty, Kenneth C, MD;  Location: [facility] ENDOSCOPY;  Service: Cardiovascular;  Laterality: N/A;       Family History  Problem Relation Age of Onset  . Diabetes Mother   . Diabetes Brother     Social History   Tobacco Use  . Smoking status: Current Every Day Smoker    Packs/day: 0.50    Years: 30.00    Pack years: 15.00    Types: Cigarettes  . Smokeless tobacco: Never Used  . Tobacco comment: 6 cigarett /day  Vaping Use  . Vaping Use: Never used  Substance Use Topics  . Alcohol use: No  . Drug use: Not Currently    Types: "Crack" cocaine, Other-see comments, Cocaine    Comment: last smoked crack 12-16-16    Home Medications Prior to Admission medications   Medication Sig Start Date End Date Taking? Authorizing Provider  aspirin 81 MG chewable tablet CHEW 1 TABLET (81 MG TOTAL) BY MOUTH DAILY. Patient taking differently: Chew 81 mg by mouth daily. 11/25/20 11/25/21 Yes Wright, Patrick E, MD  atorvastatin (LIPITOR) 80 MG tablet TAKE 1 TABLET (80 MG   TOTAL) BY MOUTH DAILY AT 6 PM. Patient taking differently: Take 80 mg by mouth daily. at 6pm 10/27/20 10/27/21 Yes Wright, Patrick E, MD  blood glucose meter kit and supplies KIT Dispense based on patient and insurance preference. Use up to four times daily as directed. (FOR ICD-9 250.00, 250.01). 11/15/19  Yes Lancaster, William C, MD  carvedilol (COREG) 25 MG tablet TAKE 1 TABLET (25 MG TOTAL) BY MOUTH 2 (TWO) TIMES DAILY WITH A MEAL. Patient taking differently: Take 25 mg by mouth 2 (two) times daily with a  meal. 10/27/20 10/27/21 Yes Wright, Patrick E, MD  Ferrous Sulfate Dried 200 (65 Fe) MG TABS TAKE 200 MG BY MOUTH 2 (TWO) TIMES DAILY WITH A MEAL. Patient taking differently: Take 200 mg by mouth 2 (two) times daily. 10/27/20 10/27/21 Yes Wright, Patrick E, MD  ibuprofen (ADVIL) 200 MG tablet Take 400 mg by mouth every 6 (six) hours as needed for fever, headache or mild pain.   Yes [provider]  insulin glargine (LANTUS) 100 UNIT/ML Solostar Pen Inject 60 Units into the skin daily. 11/15/20  Yes Wright, Patrick E, MD  insulin lispro (HUMALOG) 100 UNIT/ML KwikPen INJECT 8 UNITS INTO THE SKIN 2 (TWO) TIMES DAILY WITH A MEAL. 10/18/20 10/18/21 Yes Wright, Patrick E, MD  Insulin Pen Needle 31G X 5 MM MISC USE WITH INSULIN PENS 09/29/20 09/29/21 Yes Wright, Patrick E, MD  losartan (COZAAR) 100 MG tablet TAKE 1 TABLET (100 MG TOTAL) BY MOUTH DAILY. Patient taking differently: Take 100 mg by mouth daily. 09/15/20 09/15/21 Yes Wright, Patrick E, MD  Pancrelipase, Lip-Prot-Amyl, 6000-19000 units CPEP TAKE 2 CAPSULES (12,000 UNITS TOTAL) BY MOUTH IN THE MORNING, AT NOON, AND AT BEDTIME. Patient taking differently: Take 2 capsules by mouth 3 (three) times daily. 03/18/20 03/18/21 Yes Wright, Patrick E, MD  pantoprazole (PROTONIX) 40 MG tablet TAKE 1 TABLET (40 MG TOTAL) BY MOUTH DAILY. Patient taking differently: Take 40 mg by mouth daily. 11/25/20 11/25/21 Yes Wright, Patrick E, MD  spironolactone (ALDACTONE) 25 MG tablet TAKE 1 TABLET (25 MG TOTAL) BY MOUTH DAILY. Patient taking differently: Take 25 mg by mouth daily. 10/27/20 10/27/21 Yes Wright, Patrick E, MD  torsemide (DEMADEX) 20 MG tablet TAKE 1 TABLET (20 MG TOTAL) BY MOUTH DAILY. Patient taking differently: Take 20 mg by mouth daily. 11/25/20 11/25/21 Yes Wright, Patrick E, MD  feeding supplement, GLUCERNA SHAKE, (GLUCERNA SHAKE) LIQD Take 237 mLs by mouth 3 (three) times daily between meals. 09/11/19   Nettey, Ralph A, MD  Glucose 4-6 GM-MG CHEW Chew one as  needed for blood glucose < 70 Patient taking differently: Chew 1 tablet by mouth 2 (two) times daily as needed (low glucose). Chew one as needed for blood glucose < 70 05/06/20   Wright, Patrick E, MD  Nirmatrelvir & Ritonavir 20 x 150 MG & 10 x 100MG TBPK TAKE 3 TABLETS BY MOUTH 2 (TWO) TIMES DAILY FOR 5 DAYS. Patient not taking: No sig reported 10/07/20 10/07/21  Thompson, Kathryn R, PA-C    Allergies    Patient has no known allergies.  Review of Systems   Review of Systems  Constitutional: Negative for chills and fever.  HENT: Negative for ear pain and sore throat.   Eyes: Negative for pain and visual disturbance.  Respiratory: Negative for cough and shortness of breath.   Cardiovascular: Negative for chest pain and palpitations.  Gastrointestinal: Negative for abdominal pain and vomiting.  Genitourinary: Negative for dysuria and hematuria.  Musculoskeletal: Negative for   arthralgias and back pain.  Skin: Negative for color change and rash.  Neurological: Negative for seizures and syncope.  All other systems reviewed and are negative.   Physical Exam Updated Vital Signs BP 131/68   Pulse 72   Resp 19   Ht 5' 7" (1.702 m)   Wt 63.4 kg   SpO2 100%   BMI 21.90 kg/m   Physical Exam Vitals and nursing note reviewed.  Constitutional:      Appearance: He is well-developed.     Comments: Alert and oriented x4,no slurred speech, answer questions appropriately  HENT:     Head: Normocephalic and atraumatic.  Eyes:     Conjunctiva/sclera: Conjunctivae normal.  Cardiovascular:     Rate and Rhythm: Normal rate and regular rhythm.     Heart sounds: No murmur heard.   Pulmonary:     Effort: Pulmonary effort is normal. No respiratory distress.     Breath sounds: Normal breath sounds.  Abdominal:     Palpations: Abdomen is soft.     Tenderness: There is no abdominal tenderness.  Musculoskeletal:     Cervical back: Neck supple.  Skin:    General: Skin is warm and dry.   Neurological:     General: No focal deficit present.     Mental Status: He is alert and oriented to person, place, and time.     Sensory: No sensory deficit.     Motor: No weakness.     ED Results / Procedures / Treatments   Labs (all labs ordered are listed, but only abnormal results are displayed) Labs Reviewed  CBC - Abnormal; Notable for the following components:      Result Value   WBC 14.4 (*)    RBC 3.29 (*)    Hemoglobin 9.9 (*)    HCT 30.9 (*)    All other components within normal limits  COMPREHENSIVE METABOLIC PANEL - Abnormal; Notable for the following components:   Glucose, Bld 199 (*)    BUN 32 (*)    Creatinine, Ser 1.34 (*)    AST 14 (*)    All other components within normal limits  CBG MONITORING, ED - Abnormal; Notable for the following components:   Glucose-Capillary 208 (*)    All other components within normal limits  CBG MONITORING, ED - Abnormal; Notable for the following components:   Glucose-Capillary 185 (*)    All other components within normal limits    EKG None  Radiology No results found.  Procedures Procedures   Medications Ordered in ED Medications - No data to display  ED Course  I have reviewed the triage vital signs and the nursing notes.  Pertinent labs & imaging results that were available during my care of the patient were reviewed by me and considered in my medical decision making (see chart for details).    MDM Rules/Calculators/A&P                         55-year-old male here with hypoglycemia.  Recently switched to 60 mg of Lantus daily.  BG by EMS was found to be in the 30s, 206 here in the ER.  Patient is feeling well with no complaints.  Vitals reassuring.  CBC with leukocytosis of 4.4, unclear cause, patient has no infectious symptoms.  CMP with a baseline creatinine 1.34, no other significant abnormalities noted.  Patient was given a sandwich and some diet soda here.  I did touch base with   our diabetes RN, who  recommends the patient's Lantus be decreased to 80% or 45 units.  She was on the phone with the patient and discussed this with him.  I also relayed this to the patient.  We discussed follow-up with Dr. Wright.  He was understanding and is agreeable.  We discussed return precautions.  Stable for discharge  Final Clinical Impression(s) / ED Diagnoses Final diagnoses:  Hypoglycemia    Rx / DC Orders ED Discharge Orders    None       ,  A, PA-C 11/28/20 1410    Messick, Peter C, MD 11/28/20 1459  

## 2020-11-28 NOTE — ED Notes (Signed)
Per PA, pt okay to eat. pt given sandwich and diet soda.

## 2020-11-29 ENCOUNTER — Telehealth: Payer: Self-pay

## 2020-11-29 NOTE — Telephone Encounter (Signed)
Patient should only take Lantus once daily. With the CBG reported, he is safe to administer Lantus. Of note, the ED instructed pt recently to decrease Lantus dose to 45 units daily d/t hypoglycemia. Therefore, he should administer 45u daily. Patient has been informed of this.

## 2020-11-29 NOTE — Telephone Encounter (Signed)
Pt called wanting advice. Pt stated he ate at 1230 and at 1245 he checked his BS and it was 227. Pt wanting to know when Dr [name] wants him to take his Lantus - now or before dinner. . Pt stated he has not taken any insulin today.  Please call pt back with directions. Office closed for lunch. Routing hight priority. Team Message sent to [employer].

## 2020-11-29 NOTE — Telephone Encounter (Signed)
PEC agent connected patient with office- and I connected with clinical pharmacist for instructions.  Clinical pharmacist gave me oral confirmation to let patient know the following instructions:   "ED instructed him to take his Lantus at a reduced dose of 48 units every day. He should take that only once a day. He can continue 8u BID with meals of the Humalog."   Patient stated he was only supposed to be taking 45 units of Lantus and 5 units of Humalog. I connected with pharmacist again and he stated he did not see any documentation of that but that would be okay if that is what the ED instructed him to do. Patient repeated instructions to me and understood that Lantus was once a day and humalog was twice a day with meals. Instructed patient that clinical pharmacist would adjust his dosage as needed at appointment tomorrow.

## 2020-11-29 NOTE — Telephone Encounter (Addendum)
Pt called back and states he is going to take his insulin. He is not sure if that is ok. Will try to contact office

## 2020-11-30 ENCOUNTER — Other Ambulatory Visit: Payer: Self-pay

## 2020-11-30 ENCOUNTER — Encounter: Payer: Self-pay | Admitting: *Deleted

## 2020-11-30 ENCOUNTER — Ambulatory Visit: Payer: Medicaid Other | Attending: Critical Care Medicine | Admitting: Pharmacist

## 2020-11-30 ENCOUNTER — Encounter: Payer: Self-pay | Admitting: Pharmacist

## 2020-11-30 DIAGNOSIS — E0865 Diabetes mellitus due to underlying condition with hyperglycemia: Secondary | ICD-10-CM

## 2020-11-30 DIAGNOSIS — IMO0002 Reserved for concepts with insufficient information to code with codable children: Secondary | ICD-10-CM

## 2020-11-30 DIAGNOSIS — K8689 Other specified diseases of pancreas: Secondary | ICD-10-CM

## 2020-11-30 LAB — GLUCOSE, POCT (MANUAL RESULT ENTRY): POC Glucose: 85 mg/dl (ref 70–99)

## 2020-11-30 MED ORDER — INSULIN GLARGINE 100 UNIT/ML SOLOSTAR PEN
40.0000 [IU] | PEN_INJECTOR | Freq: Every day | SUBCUTANEOUS | 11 refills | Status: DC
  Filled 2020-11-30: qty 15, 37d supply, fill #0

## 2020-11-30 MED ORDER — INSULIN LISPRO (1 UNIT DIAL) 100 UNIT/ML (KWIKPEN)
5.0000 [IU] | PEN_INJECTOR | Freq: Two times a day (BID) | SUBCUTANEOUS | 1 refills | Status: DC
  Filled 2020-11-30 – 2020-12-23 (×2): qty 3, 28d supply, fill #0

## 2020-11-30 NOTE — Patient Instructions (Signed)
.  Thank you for coming to see me today. Please do the following:  1. In the morning, check your blood sugar. If it is less than 100, skip Lantus. 2. During the day, check your blood sugar BEFORE eating. If it is under 150, skip Humalog. If you skip a meal, do not take Humalog.  3. I am reducing your Lantus dose to 40 units daily.  4. Continue Humalog 5 units before meals if you need it.  5. Continue checking blood sugars at home.  6. Continue making the lifestyle changes we've discussed together during our visit. Diet and exercise play a significant role in improving your blood sugars.  7. Follow-up with me in 1 week.    Hypoglycemia or low blood sugar:   Low blood sugar can happen quickly and may become an emergency if not treated right away.   While this shouldn't happen often, it can be brought upon if you skip a meal or do not eat enough. Also, if your insulin or other diabetes medications are dosed too high, this can cause your blood sugar to go to low.   Warning signs of low blood sugar include: 1. Feeling shaky or dizzy 2. Feeling weak or tired  3. Excessive hunger 4. Feeling anxious or upset  5. Sweating even when you aren't exercising  What to do if I experience low blood sugar? 1. Check your blood sugar with your meter. If lower than 70, proceed to step 2.  2. Treat with 3-4 glucose tablets or 3 packets of regular sugar. If these aren't around, you can try hard candy. Yet another option would be to drink 4 ounces of fruit juice or 6 ounces of REGULAR soda.  3. Re-check your sugar in 15 minutes. If it is still below 70, do what you did in step 2 again. If has come back up, go ahead and eat a snack or small meal at this time.

## 2020-11-30 NOTE — Congregational Nurse Program (Signed)
  Dept: [phone]   Congregational Nurse Program Note  Date of Encounter: 11/30/2020  Past Medical History: Past Medical History:  Diagnosis Date  . 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 Diabetes mellitus   . Hypertension   . Pancreatitis     Encounter Details:pt is seen today by RN at [facility] for f/u from ED r/t hypoglycemia. We spoke about both insulins and the importance of having a schedule to check cbg's and take insulins. Pt states he never took the 60 units of lantus but he took lantus and humalog at the same time. Discussed never to do that and to never take insulin without checking cbg. He did not understand so we restated 3 to 4 times. This is why it would be good for pt to be in an assisted facility so his schedule would be easier. Some days he takes lantus in early am after checking cbg but there are days he does not. Explained why he needed to be doing meds at same time daily and checking cbg regularly. Will continue to follow as close as possible. Ask him to have front desk to call me any time he has questions, he states he will do so.

## 2020-11-30 NOTE — Progress Notes (Signed)
    S:    PCP: Dr. [name]   No chief complaint on file.  Patient arrives in good spirits. Presents for diabetes evaluation, education, and management. Was seen last and referred by Dr. [name] on 11/15/2020. We changed his Levemir to glargine 60u once daily. Unfortunately, pt developed hypoglycemia. He was seen in the ED for severe hypoglycemia on 11/28/2020. He was discharged on a reduced dose of glargine (45u daily).   Today, he reports doing better since his ED visit. However, he still has some hypoglycemia. This morning, his CBG was 65 fasting. He was not symptomatic at that time and ate breakfast before coming in today. He has not taken any insulin this morning. Patient denies polyuria, polydipsia.   Patient reports adherence with medications.   Current diabetes medications include:  - Lantus 45u daily - Humalog 5u BID before meals  Dietary changes:  - Pt's diet is limited based on availability at the [facility]   Physical Activity:  - Limited   O:  POCT: 85   Lab Results  Component Value Date   HGBA1C 9.6 (A) 11/15/2020   There were no vitals filed for this visit.   Patient does not have his glucometer. Sugar levels vary.  Fasting ranges: 60s - 200s  A/P: Diabetes longstanding currently uncontrolled. Patient is able to verbalize understanding of a hypoglycemia management plan. He is adherent with his insulin unless he feels his sugar is too low. Will decrease Lantus further d/t hypoglycemia.  - Decrease Lantus to 40 units daily. Pt instructed to take in the morning if fasting CBG is >100.  - Continue Humalog 5u BID before meals. Pt instructed to skip this if pre-meal CBG is >150 or if he skips the meal.  - A1c anticipated 02/2021 - Lifestyle/Diet heavily emphasized - Regular home monitoring of blood glucose emphasized   Time spent face-to-face counseling: 30 minutes Follow-up pharmD appt on 12/30/2020.  Butch Penny, PharmD, P[place]y [place], CPP Clinical  Pharmacist [facility] [phone]

## 2020-12-02 ENCOUNTER — Other Ambulatory Visit (HOSPITAL_COMMUNITY): Payer: Self-pay

## 2020-12-02 MED FILL — Losartan Potassium Tab 100 MG: ORAL | 30 days supply | Qty: 30 | Fill #0 | Status: AC

## 2020-12-02 MED FILL — Insulin Pen Needle 31 G X 5 MM (1/5" or 3/16"): 30 days supply | Qty: 100 | Fill #0 | Status: AC

## 2020-12-07 ENCOUNTER — Other Ambulatory Visit: Payer: Self-pay

## 2020-12-08 ENCOUNTER — Other Ambulatory Visit (HOSPITAL_COMMUNITY): Payer: Self-pay

## 2020-12-08 ENCOUNTER — Encounter: Payer: Self-pay | Admitting: Critical Care Medicine

## 2020-12-08 ENCOUNTER — Other Ambulatory Visit: Payer: Self-pay | Admitting: Critical Care Medicine

## 2020-12-08 MED ORDER — TRIAMCINOLONE ACETONIDE 0.1 % EX CREA
1.0000 "application " | TOPICAL_CREAM | Freq: Two times a day (BID) | CUTANEOUS | 0 refills | Status: DC
  Filled 2020-12-08 (×2): qty 30, 15d supply, fill #0

## 2020-12-09 ENCOUNTER — Encounter: Payer: Self-pay | Admitting: Critical Care Medicine

## 2020-12-09 ENCOUNTER — Other Ambulatory Visit (HOSPITAL_COMMUNITY): Payer: Self-pay

## 2020-12-09 ENCOUNTER — Encounter: Payer: Self-pay | Admitting: *Deleted

## 2020-12-09 MED ORDER — CEPHALEXIN 250 MG PO CAPS
250.0000 mg | ORAL_CAPSULE | Freq: Four times a day (QID) | ORAL | 0 refills | Status: DC
  Filled 2020-12-09: qty 28, 7d supply, fill #0

## 2020-12-09 NOTE — Progress Notes (Signed)
Patient ID: Rodney Barber, male   DOB: 06-07-1966, 55 y.o.   MRN: [id number] This is a 55 year old male with type 2 diabetes hypertension heart disease patient comes to the [facility] for a checkup he is now on 40 units of Lantus in the morning 5 units Humalog at lunch and dinner his blood glucose on arrival today was 360 he has had no hypoglycemic episodes recently he does have a large renal stone calculus in his left kidney and has an upcoming urology appointment he also has upcoming cardiology appointments we will get blood spasms for him  Patient does complain of rash on his back he has extremely dry skin  Blood pressure 116/56 pulse 73 saturation is 96% room air chest and heart exam is unremarkable skin is very dry with an eczematous type rash in the back  Impression is eczema of the skin severe dry skin #1 #2 type 2 diabetes #3 hypertension #4 coronary disease  Plan for this patient is to provide him a letter for special assistance application so we can see if he can get a Medicaid disability on an expedited basis also provide triamcinolone cream along with a skin moisturizer kit which we gave the patient today he is in a maintain his insulin program as prescribed and other medications as prescribed no changes

## 2020-12-09 NOTE — Progress Notes (Addendum)
Cardiology Office Note:    Date:  12/10/2020   ID:  Rodney Barber, DOB 1965-12-22, MRN [id number]  PCP:  Elsie Stain, MD  Cardiologist:  Peter Martinique, MD   Referring MD: Elsie Stain, MD   Chief Complaint  Patient presents with  . Follow-up    Chest pain, CAD    History of Present Illness:    Rodney Barber is a 55 y.o. male with a hx of HTN, CAD, chronic systolic and diastolic heart failure, DM2, HLD, severe malnutrition, substance use disorder, and tobacco abuse. He has a history of uncontrolled DM, chornic pancreatitis, noncompliance, and homelessness. Echo 05/2019 with EF 60-65% and pericardial effusion. Repeat echo 10/2019 EF 40-45%, garde 1 DD, small filamentous mobile echodensity unchanged from pkrio TEE. Admitted May 2021 with CHF exacerbation - echo with EF 35-40%. Myoview 01/01/20 was high risk suspicious for a previous MI and peri-infarct ischemia, he underwent heart cath showing diffuse coronary atherosclerosis with heavy calcification. Modest single vessel obstructive CAD involving mid RCA and severe LV dysfunction with EF 25-30%. LV dysfunction felt out of proportion to his degree of CAD.  Follow up echo 05/04/20 showed EF 35-40%, grade 1 DD, and no significant valvular disease. He is managed with medical therapy for NICM and CAD. Dye to complaints of pain and cool extremities, he had ABIs which were normal. Last seen 04/19/20 and doing well, compliant on medications. He had COVID in March 2022.   He is maintained on ASA, lipitor, coreg, losartan, spironolacone, and torsemide. He is on insulin for DM. He presents today for scheduled follow up and preop clearance for  He was seen in the ER 10/06/20 wit COVID and UTI. Imaging found left kidney stone. No immediate intervention, but possible percutanous management of stone burden "down the road."   He presents today for routine follow up. He is here alone. He reports dull chest pain at rest 2 days ago. CP is intermittent  lasting 1-2 minutes and has been occurring for the last 2 days. CP generally occurs at rest, but not when he is walking. Pain located in the center-left chest without radiation, no SOB, nausea, or diaphoresis. CP occurs at rest but not exertion.   Past Medical History:  Diagnosis Date  . Alcohol-induced chronic pancreatitis ([place]) 07/22/2013  . Diabetes mellitus   . Hypertension   . Pancreatitis     Past Surgical History:  Procedure Laterality Date  . ERCP  06/27/2011   Procedure: ENDOSCOPIC RETROGRADE CHOLANGIOPANCREATOGRAPHY (ERCP);  Surgeon: Jeryl Columbia, MD;  Location: [facility];  Service: Endoscopy;  Laterality: N/A;  . ERCP W/ PLASTIC STENT PLACEMENT  03/2011  . IR US GUIDE BX ASP/DRAIN  05/16/2019  . LEFT HEART CATH AND CORONARY ANGIOGRAPHY N/A 01/02/2020   Procedure: LEFT HEART CATH AND CORONARY ANGIOGRAPHY;  Surgeon: Martinique, Peter M, MD;  Location: [facility];  Service: Cardiovascular;  Laterality: N/A;  . TEE WITHOUT CARDIOVERSION N/A 05/20/2019   Procedure: TRANSESOPHAGEAL ECHOCARDIOGRAM (TEE);  Surgeon: [name], MD;  Location: [facility] ENDOSCOPY;  Service: Cardiovascular;  Laterality: N/A;    Current Medications: Current Meds  Medication Sig  . aspirin 81 MG chewable tablet CHEW 1 TABLET (81 MG TOTAL) BY MOUTH DAILY. (Patient taking differently: Chew 81 mg by mouth daily.)  . atorvastatin (LIPITOR) 80 MG tablet TAKE 1 TABLET (80 MG TOTAL) BY MOUTH DAILY AT 6 PM. (Patient taking differently: Take 80 mg by mouth daily. at 6pm)  . blood glucose meter kit and  supplies KIT Dispense based on patient and insurance preference. Use up to four times daily as directed. (FOR ICD-9 250.00, 250.01).  . carvedilol (COREG) 25 MG tablet TAKE 1 TABLET (25 MG TOTAL) BY MOUTH 2 (TWO) TIMES DAILY WITH A MEAL. (Patient taking differently: Take 25 mg by mouth 2 (two) times daily with a meal.)  . cephALEXin (KEFLEX) 250 MG capsule Take 1 capsule (250 mg total) by mouth 4 (four) times  daily.  . feeding supplement, GLUCERNA SHAKE, (GLUCERNA SHAKE) LIQD Take 237 mLs by mouth 3 (three) times daily between meals.  . Ferrous Sulfate Dried 200 (65 Fe) MG TABS TAKE 200 MG BY MOUTH 2 (TWO) TIMES DAILY WITH A MEAL. (Patient taking differently: Take 200 mg by mouth 2 (two) times daily.)  . Glucose 4-6 GM-MG CHEW Chew one as needed for blood glucose < 70 (Patient taking differently: Chew 1 tablet by mouth 2 (two) times daily as needed (low glucose). Chew one as needed for blood glucose < 70)  . ibuprofen (ADVIL) 200 MG tablet Take 400 mg by mouth every 6 (six) hours as needed for fever, headache or mild pain.  Marland Kitchen insulin glargine (LANTUS) 100 UNIT/ML Solostar Pen Inject 40 Units into the skin daily.  . insulin lispro (HUMALOG) 100 UNIT/ML KwikPen Inject 5 Units into the skin 2 (two) times daily before a meal. Skip your dose if you do not eat or if your sugar is <150.  Marland Kitchen Insulin Pen Needle 31G X 5 MM MISC USE WITH INSULIN PENS  . isosorbide mononitrate (IMDUR) 30 MG 24 hr tablet Take 1 tablet (30 mg total) by mouth daily.  Marland Kitchen losartan (COZAAR) 100 MG tablet TAKE 1 TABLET (100 MG TOTAL) BY MOUTH DAILY. (Patient taking differently: Take 100 mg by mouth daily.)  . Nirmatrelvir & Ritonavir 20 x 150 MG & 10 x 100MG TBPK TAKE 3 TABLETS BY MOUTH 2 (TWO) TIMES DAILY FOR 5 DAYS.  . Pancrelipase, Lip-Prot-Amyl, 6000-19000 units CPEP TAKE 2 CAPSULES (12,000 UNITS TOTAL) BY MOUTH IN THE MORNING, AT NOON, AND AT BEDTIME. (Patient taking differently: Take 2 capsules by mouth 3 (three) times daily.)  . pantoprazole (PROTONIX) 40 MG tablet TAKE 1 TABLET (40 MG TOTAL) BY MOUTH DAILY. (Patient taking differently: Take 40 mg by mouth daily.)  . spironolactone (ALDACTONE) 25 MG tablet TAKE 1 TABLET (25 MG TOTAL) BY MOUTH DAILY. (Patient taking differently: Take 25 mg by mouth daily.)  . torsemide (DEMADEX) 20 MG tablet TAKE 1 TABLET (20 MG TOTAL) BY MOUTH DAILY. (Patient taking differently: Take 20 mg by mouth  daily.)  . triamcinolone cream (KENALOG) 0.1 % Apply 1 application topically 2 (two) times daily.  . [DISCONTINUED] nitroGLYCERIN (NITROSTAT) 0.4 MG SL tablet Place 1 tablet (0.4 mg total) under the tongue every 5 (five) minutes as needed for chest pain.   Current Facility-Administered Medications for the 12/10/20 encounter (Office Visit) with Ledora Bottcher, PA  Medication  . Insta-Glucose 77.4 % GEL 1 Tube     Allergies:   Patient has no known allergies.   Social History   Socioeconomic History  . Marital status: Single    Spouse name: Not on file  . Number of children: Not on file  . Years of education: Not on file  . Highest education level: Not on file  Occupational History  . Not on file  Tobacco Use  . Smoking status: Current Every Day Smoker    Packs/day: 0.50    Years: 30.00    Pack  years: 15.00    Types: Cigarettes  . Smokeless tobacco: Never Used  . Tobacco comment: 6 cigarett /day  Vaping Use  . Vaping Use: Never used  Substance and Sexual Activity  . Alcohol use: No  . Drug use: Not Currently    Types: "Crack" cocaine, Other-see comments, Cocaine    Comment: last smoked crack 12-16-16  . Sexual activity: Never  Other Topics Concern  . Not on file  Social History Narrative  . Not on file   Social Determinants of Health   Financial Resource Strain: Not on file  Food Insecurity: Not on file  Transportation Needs: Not on file  Physical Activity: Not on file  Stress: Not on file  Social Connections: Not on file     Family History: The patient's family history includes Diabetes in his brother and mother.  ROS:   Please see the history of present illness.     All other systems reviewed and are negative.  EKGs/Labs/Other Studies Reviewed:    The following studies were reviewed today:  Left heart cath 01/02/20:  Prox LAD to Dist LAD lesion is 30% stenosed.  Mid Cx to Dist Cx lesion is 60% stenosed.  Mid RCA lesion is 70% stenosed.  There is  severe left ventricular systolic dysfunction.  LV end diastolic pressure is mildly elevated.  The left ventricular ejection fraction is 25-35% by visual estimate.   1. Diffuse coronary atherosclerosis with heavy calcification. 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 Severe LV dysfunction EF estimated at 25-30% 3. Elevated EDP 24 mm Hg  Plan: aggressive medical therapy for CHF. Will add aldactone to Coreg, lasix, and losartan. Titrate as BP allows. High dose statin therapy. LV dysfunction is out of proportion to the degree of CAD.   EKG:  EKG is  ordered today.  The ekg ordered today demonstrates sinus rhythm, HR 72, LVH with repolarization abnormality, poor R wave progression, stable from prior  Recent Labs: 12/28/2019: B Natriuretic Peptide 544.4 12/30/2019: Magnesium 2.3 11/28/2020: ALT 18; BUN 32; Creatinine, Ser 1.34; Hemoglobin 9.9; Platelets 204; Potassium 3.7; Sodium 138  Recent Lipid Panel    Component Value Date/Time   CHOL 111 11/15/2020 1457   TRIG 79 11/15/2020 1457   HDL 36 (L) 11/15/2020 1457   CHOLHDL 3.1 11/15/2020 1457   CHOLHDL 3.7 01/01/2020 0550   VLDL 20 01/01/2020 0550   LDLCALC 59 11/15/2020 1457    Physical Exam:    VS:  BP (!) 110/50   Pulse 73   Ht 5' 7"  (1.702 m)   Wt 139 lb 6.4 oz (63.2 kg)   SpO2 97%   BMI 21.83 kg/m     Wt Readings from Last 3 Encounters:  12/10/20 139 lb 6.4 oz (63.2 kg)  11/28/20 139 lb 12.8 oz (63.4 kg)  11/15/20 139 lb 12.8 oz (63.4 kg)     GEN:  Well nourished, well developed in no acute distress NECK: No JVD CARDIAC: RRR, no murmurs, rubs, gallops RESPIRATORY:  Clear to auscultation without rales, wheezing or rhonchi  MUSCULOSKELETAL:  No edema; No deformity  SKIN: Warm and dry NEUROLOGIC:  Alert and oriented x 3 PSYCHIATRIC:  Normal affect   ASSESSMENT:    1. Chest pain of uncertain etiology   2. Coronary artery disease involving native coronary artery of native heart without angina  pectoris   3. Chronic combined systolic and diastolic heart failure ([place])   4. NICM (nonischemic cardiomyopathy) ([place])   5. Essential hypertension  6. Hyperlipidemia, unspecified hyperlipidemia type    PLAN:    In order of problems listed above:  Chest pain - he describes chest pain at rest but not exertion - CP is concerning for possible angina given his known disease, uncontrolled DM, and continued tobacco use - 12 lead today appears stable and unchanged from prior tracing - he has had a stress test that was abnormal leading to heart cath - he has known CAD, but CKD, recent sCr 1.34 (1.51) - I think a repeat stress test at this time would not be of high yield given his abnormal nuc without intervention last year - I will try to treat with medical therapy first - will start 30 mg imdur at night - if hypotension, may need to reduce losartan - I will see him back at first available, if still having chest pain would have a low threshold to re-cath - he is compliant on medications and living at [facility] - we reviewed ER precautions, I also provided nitro SL, but advised him to sit and rest if he takes it since he will likely have hypotension   NICM Chronic systolic and diastolic heart failure - continue BB, ARB, spiro, torsemide  - he appears euvolemic today   CAD - continue ASA, BB, lipitor - chest pain as above   HLD 01/01/2020: VLDL 20 11/15/2020: Cholesterol, Total 111; HDL 36; LDL Chol Calc (NIH) 59; Triglycerides 79 Continue lipitor   Follow up in 2-3 weeks.    Medication Adjustments/Labs and Tests Ordered: Current medicines are reviewed at length with the patient today.  Concerns regarding medicines are outlined above.  Orders Placed This Encounter  Procedures  . EKG 12-Lead   Meds ordered this encounter  Medications  . isosorbide mononitrate (IMDUR) 30 MG 24 hr tablet    Sig: Take 1 tablet (30 mg total) by mouth daily.    Dispense:  90 tablet    Refill:   3  . DISCONTD: nitroGLYCERIN (NITROSTAT) 0.4 MG SL tablet    Sig: Place 1 tablet (0.4 mg total) under the tongue every 5 (five) minutes as needed for chest pain.    Dispense:  90 tablet    Refill:  3  . nitroGLYCERIN (NITROSTAT) 0.4 MG SL tablet    Sig: Place 1 tablet (0.4 mg total) under the tongue every 5 (five) minutes as needed for chest pain.    Dispense:  90 tablet    Refill:  3    Signed, Ledora Bottcher, [place]  12/10/2020 4:56 PM    [facility]

## 2020-12-10 ENCOUNTER — Other Ambulatory Visit: Payer: Self-pay

## 2020-12-10 ENCOUNTER — Ambulatory Visit (INDEPENDENT_AMBULATORY_CARE_PROVIDER_SITE_OTHER): Payer: Self-pay | Admitting: Physician Assistant

## 2020-12-10 ENCOUNTER — Encounter: Payer: Self-pay | Admitting: Physician Assistant

## 2020-12-10 VITALS — BP 110/50 | HR 73 | Ht 67.0 in | Wt 139.4 lb

## 2020-12-10 DIAGNOSIS — I2510 Atherosclerotic heart disease of native coronary artery without angina pectoris: Secondary | ICD-10-CM

## 2020-12-10 DIAGNOSIS — I5042 Chronic combined systolic (congestive) and diastolic (congestive) heart failure: Secondary | ICD-10-CM

## 2020-12-10 DIAGNOSIS — R079 Chest pain, unspecified: Secondary | ICD-10-CM

## 2020-12-10 DIAGNOSIS — I428 Other cardiomyopathies: Secondary | ICD-10-CM

## 2020-12-10 DIAGNOSIS — E785 Hyperlipidemia, unspecified: Secondary | ICD-10-CM

## 2020-12-10 DIAGNOSIS — I10 Essential (primary) hypertension: Secondary | ICD-10-CM

## 2020-12-10 MED ORDER — ISOSORBIDE MONONITRATE ER 30 MG PO TB24
30.0000 mg | ORAL_TABLET | Freq: Every day | ORAL | 3 refills | Status: DC
  Filled 2020-12-10: qty 30, 30d supply, fill #0

## 2020-12-10 MED ORDER — NITROGLYCERIN 0.4 MG SL SUBL
0.4000 mg | SUBLINGUAL_TABLET | SUBLINGUAL | 3 refills | Status: DC | PRN
  Filled 2020-12-10: qty 90, 1d supply, fill #0

## 2020-12-10 MED ORDER — NITROGLYCERIN 0.4 MG SL SUBL
0.4000 mg | SUBLINGUAL_TABLET | SUBLINGUAL | 3 refills | Status: DC | PRN
  Filled 2020-12-10: qty 25, 30d supply, fill #0

## 2020-12-10 NOTE — Patient Instructions (Addendum)
Medication Instructions:  Start Imdur 30 mg (1 Tablet Daily) .  *If you need a refill on your cardiac medications before your next appointment, please call your pharmacy*   Lab Work: No labs If you have labs (blood work) drawn today and your tests are completely normal, you will receive your results only by: Marland Kitchen MyChart Message (if you have MyChart) OR . A paper copy in the mail If you have any lab test that is abnormal or we need to change your treatment, we will call you to review the results.   Testing/Procedures: No Testing   Follow-Up: At [facility], you and your health needs are our priority.  As part of our continuing mission to provide you with exceptional heart care, we have created designated Provider Care Teams.  These Care Teams include your primary Cardiologist (physician) and Advanced Practice Providers (APPs -  Physician Assistants and Nurse Practitioners) who all work together to provide you with the care you need, when you need it.  We recommend signing up for the patient portal called "MyChart".  Sign up information is provided on this After Visit Summary.  MyChart is used to connect with patients for Virtual Visits (Telemedicine).  Patients are able to view lab/test results, encounter notes, upcoming appointments, etc.  Non-urgent messages can be sent to your provider as well.   To learn more about what you can do with MyChart, go to [email or web address].    Your next appointment:   1-2 weeks  The format for your next appointment:   In Person  Provider:   Micah Flesher PA-C

## 2020-12-11 ENCOUNTER — Other Ambulatory Visit: Payer: Self-pay

## 2020-12-13 ENCOUNTER — Other Ambulatory Visit: Payer: Self-pay

## 2020-12-15 ENCOUNTER — Other Ambulatory Visit (HOSPITAL_COMMUNITY): Payer: Self-pay

## 2020-12-15 ENCOUNTER — Other Ambulatory Visit: Payer: Self-pay

## 2020-12-15 ENCOUNTER — Other Ambulatory Visit: Payer: Self-pay | Admitting: Critical Care Medicine

## 2020-12-15 ENCOUNTER — Other Ambulatory Visit: Payer: Self-pay | Admitting: *Deleted

## 2020-12-15 ENCOUNTER — Encounter: Payer: Self-pay | Admitting: Critical Care Medicine

## 2020-12-15 ENCOUNTER — Telehealth: Payer: Self-pay | Admitting: *Deleted

## 2020-12-15 MED ORDER — SULFAMETHOXAZOLE-TRIMETHOPRIM 800-160 MG PO TABS
1.0000 | ORAL_TABLET | Freq: Two times a day (BID) | ORAL | 0 refills | Status: DC
  Filled 2020-12-15: qty 14, 7d supply, fill #0

## 2020-12-15 MED ORDER — NITROGLYCERIN 0.4 MG SL SUBL
0.4000 mg | SUBLINGUAL_TABLET | SUBLINGUAL | 3 refills | Status: AC | PRN
  Filled 2020-12-15: qty 25, 1d supply, fill #0

## 2020-12-15 MED ORDER — ISOSORBIDE MONONITRATE ER 30 MG PO TB24
30.0000 mg | ORAL_TABLET | Freq: Every day | ORAL | 3 refills | Status: DC
  Filled 2020-12-15 (×2): qty 90, 90d supply, fill #0

## 2020-12-15 MED ORDER — NITROGLYCERIN 0.4 MG SL SUBL
0.4000 mg | SUBLINGUAL_TABLET | SUBLINGUAL | 3 refills | Status: DC | PRN
  Filled 2020-12-15: qty 90, 1d supply, fill #0
  Filled 2020-12-15: qty 100, 1d supply, fill #0

## 2020-12-15 MED ORDER — ISOSORBIDE MONONITRATE ER 30 MG PO TB24
30.0000 mg | ORAL_TABLET | Freq: Every day | ORAL | 3 refills | Status: DC
  Filled 2020-12-15: qty 30, 30d supply, fill #0

## 2020-12-15 NOTE — Telephone Encounter (Signed)
done

## 2020-12-15 NOTE — Telephone Encounter (Signed)
Dr [name] can you resend pt's meds ordered by cardiology? So we can add them to cong. Program for pay. They are the nitro and isosorbide. [facility].

## 2020-12-15 NOTE — Progress Notes (Signed)
Patient ID: Rodney Barber, male   DOB: [birth year], 55 y.o.   MRN: [id number] Patient seen today at the [facility] clinic he has an infection on the left index finger dorsal surface.  He has taken a course of Keflex without much improvement.  He now has an area of bleb formation on the dorsal aspect of the finger.  This was incised and drained and was purulent.  A dressing was applied  Plan to discontinue Keflex and begin Bactrim twice daily for 7 days  If he is unimproved in the morning we will send him to the mobile medicine unit for further debridement

## 2020-12-16 ENCOUNTER — Encounter: Payer: Self-pay | Admitting: *Deleted

## 2020-12-16 ENCOUNTER — Other Ambulatory Visit: Payer: Self-pay | Admitting: *Deleted

## 2020-12-16 NOTE — Congregational Nurse Program (Signed)
  Dept: [phone]   Congregational Nurse Program Note  Date of Encounter: 12/16/2020  Past Medical History: Past Medical History:  Diagnosis Date  . 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 Diabetes mellitus   . Hypertension   . Pancreatitis     Encounter Details: evaluate finger, swelling much reduced, no drainage, pt had removed drsg from 5/11. Cleaned w/ peroxide, air dry, tripleabx oint per pt's choice applied and 3 bandaids, given bandaids and triple abx oint given and ask to rremove and replace bandaids if they become wet, wash with warm soapy water, rinse well, air dry and replace until Monday then may discont drsg , keep clean and dry. He is agreeable, gave verbal report to dr [name].  Cont to have problem with compliance of insulin. Spoke w/ pt about assisted ;living . CSW, angel had left for day will speak to her when available

## 2020-12-17 ENCOUNTER — Other Ambulatory Visit (HOSPITAL_COMMUNITY): Payer: Self-pay

## 2020-12-17 MED ORDER — ATORVASTATIN CALCIUM 80 MG PO TABS
80.0000 mg | ORAL_TABLET | Freq: Every day | ORAL | 2 refills | Status: DC
  Filled 2020-12-17: qty 60, 60d supply, fill #0
  Filled 2020-12-23: qty 30, 30d supply, fill #0

## 2020-12-17 MED ORDER — SPIRONOLACTONE 25 MG PO TABS
25.0000 mg | ORAL_TABLET | Freq: Every day | ORAL | 5 refills | Status: DC
  Filled 2020-12-17: qty 60, 60d supply, fill #0
  Filled 2020-12-23: qty 30, 30d supply, fill #0

## 2020-12-22 ENCOUNTER — Other Ambulatory Visit (HOSPITAL_COMMUNITY): Payer: Self-pay

## 2020-12-22 ENCOUNTER — Telehealth: Payer: Self-pay | Admitting: Critical Care Medicine

## 2020-12-22 ENCOUNTER — Encounter: Payer: Self-pay | Admitting: Critical Care Medicine

## 2020-12-22 ENCOUNTER — Other Ambulatory Visit: Payer: Self-pay | Admitting: *Deleted

## 2020-12-22 MED ORDER — INSULIN GLARGINE 100 UNIT/ML SOLOSTAR PEN
40.0000 [IU] | PEN_INJECTOR | Freq: Every day | SUBCUTANEOUS | 11 refills | Status: DC
  Filled 2020-12-22: qty 15, 37d supply, fill #0
  Filled 2020-12-23: qty 12, 30d supply, fill #0

## 2020-12-22 NOTE — Telephone Encounter (Signed)
Call placed to patient and he states that his medication will be delivered tomorrow.

## 2020-12-22 NOTE — Telephone Encounter (Signed)
Copied from CRM [id number]. Topic: General - Other >> Dec 20, 2020  1:54 PM Marylen Ponto wrote: Reason for CRM: Pt stated Dr. [name] normally has a nurse pick up his Rx for insulin glargine (LANTUS) 100 UNIT/ML Solostar Pen because he is not able to pick it up himself. Pt requests call back  Unsure if this needs further follow up. Please follow up if appropriate.

## 2020-12-23 ENCOUNTER — Encounter: Payer: Self-pay | Admitting: Critical Care Medicine

## 2020-12-23 ENCOUNTER — Other Ambulatory Visit (HOSPITAL_COMMUNITY): Payer: Self-pay

## 2020-12-23 ENCOUNTER — Telehealth: Payer: Self-pay

## 2020-12-23 NOTE — Telephone Encounter (Signed)
Per [facility],   they are in the process of appealing the patient's  disability denial. The reconsideration could take 3-4 months. A copy of the letter that Dr [name] composed was sent to [facility] to support the case for patient's need for approval of his disability application.    Per Shanda Bumps Wright/Financial Counseling - [facility]. She reviewed their records and it appears a Medicaid application was  submitted in November 2020 and he was not found disabled.  [employer] currently shows he only has COVID Medicaid coverage. [employer] has tried on multiple admissions to assist him but he never responds to their contact attempts nor returns rep forms so it does not appear that he has anything pending with Medicaid.  She also stated that he will not be eligible for special assistance medicaid with long term care until he is approved for Medicaid.  Layla Barter recommends that a Medicaid application be submitted while they are waiting a decision on the disability.  Myriam Jacobson are you able to assist with this?

## 2020-12-23 NOTE — Progress Notes (Signed)
Patient ID: Rodney Barber, male   DOB: 01-19-1966, 55 y.o.   MRN: [id number] This a 55 year old male history of type 1 diabetes due to chronic pancreatitis coronary disease congestive heart failure prior history of alcohol use  This patient comes into the office today at the [facility] blood sugar initially was 500 he has run out of his Lantus is only been taking Humalog  We went over the patient's medications with him again we gave him another dose of Humalog 5 units his blood sugars down to 493 we will get him refills on the Lantus and ensure he has follow-up in the community [facility]  We will also be producing a letter for him documenting his conditions

## 2020-12-29 ENCOUNTER — Other Ambulatory Visit (HOSPITAL_COMMUNITY): Payer: Self-pay

## 2020-12-29 MED ORDER — INSULIN GLARGINE 100 UNIT/ML SOLOSTAR PEN
40.0000 [IU] | PEN_INJECTOR | Freq: Every day | SUBCUTANEOUS | 11 refills | Status: DC
  Filled 2020-12-29: qty 15, 37d supply, fill #0

## 2020-12-30 ENCOUNTER — Other Ambulatory Visit (HOSPITAL_COMMUNITY): Payer: Self-pay

## 2020-12-30 ENCOUNTER — Ambulatory Visit: Payer: Self-pay | Attending: Critical Care Medicine | Admitting: Pharmacist

## 2020-12-30 ENCOUNTER — Other Ambulatory Visit: Payer: Self-pay | Admitting: *Deleted

## 2020-12-30 ENCOUNTER — Other Ambulatory Visit: Payer: Self-pay

## 2020-12-30 ENCOUNTER — Encounter: Payer: Self-pay | Admitting: Pharmacist

## 2020-12-30 DIAGNOSIS — K8689 Other specified diseases of pancreas: Secondary | ICD-10-CM

## 2020-12-30 DIAGNOSIS — IMO0002 Reserved for concepts with insufficient information to code with codable children: Secondary | ICD-10-CM

## 2020-12-30 DIAGNOSIS — E0865 Diabetes mellitus due to underlying condition with hyperglycemia: Secondary | ICD-10-CM

## 2020-12-30 LAB — GLUCOSE, POCT (MANUAL RESULT ENTRY): POC Glucose: 175 mg/dl — AB (ref 70–99)

## 2020-12-30 MED ORDER — CARVEDILOL 25 MG PO TABS
25.0000 mg | ORAL_TABLET | Freq: Two times a day (BID) | ORAL | 5 refills | Status: DC
  Filled 2020-12-30: qty 60, fill #0
  Filled 2021-01-06: qty 60, 30d supply, fill #0

## 2020-12-30 MED ORDER — LOSARTAN POTASSIUM 100 MG PO TABS
100.0000 mg | ORAL_TABLET | Freq: Every day | ORAL | 5 refills | Status: DC
  Filled 2020-12-30: qty 30, fill #0
  Filled 2021-01-06: qty 30, 30d supply, fill #0

## 2020-12-30 MED ORDER — PANTOPRAZOLE SODIUM 40 MG PO TBEC
40.0000 mg | DELAYED_RELEASE_TABLET | Freq: Every day | ORAL | 5 refills | Status: DC
  Filled 2020-12-30: qty 30, fill #0
  Filled 2021-01-06: qty 30, 30d supply, fill #0

## 2020-12-30 NOTE — Progress Notes (Signed)
    S:    PCP: Dr. [name]   No chief complaint on file.  Patient arrives in good spirits. Presents for diabetes evaluation, education, and management. Was seen last and referred by Dr. [name] on 11/15/2020. I saw him last on 11/30/2020 and adjusted his insulin d/t hypoglycemia. Of note, he saw Dr. [name] at the [facility] with hyperglycemia d/t running out of Lantus. Patient has since picked up more Lantus.   Today, he reports doing better overall. Denies hypoglycemia. This morning, his CBG was 115 prior to his appointment. He took 5u of Humalog and drank a Glucerna. He has not taken his Lantus yet. Denies hyperglycemia since obtaining Lantus. Patient denies polyuria, polydipsia.   Patient reports adherence with medications.   Current diabetes medications include:  - Lantus 40u daily - Humalog 5u BID before meals  Dietary changes:  - Pt's diet is limited based on availability at the [facility]   Physical Activity:  - Limited   O:  POCT: 175 (drank glucerna ~1 hour ago)  Lab Results  Component Value Date   HGBA1C 9.6 (A) 11/15/2020   There were no vitals filed for this visit.   Patient does not have his glucometer. Sugar levels vary. Per pt, he spends most of his time in the 100s. He does see outliers in the 200-300 range but these occur only 1-2x per week per pt.  Fasting ranges: 80s - 200s  A/P: Diabetes longstanding currently uncontrolled. Patient is able to verbalize understanding of a hypoglycemia management plan. He is adherent with his insulin unless he feels his sugar is too low. Will not make any changes and reassess in 2-3 weeks.  - Continue Lantus 40 units daily. Pt instructed to take in the morning if fasting CBG is >100.  - Continue Humalog 5u BID before meals. Pt instructed to skip this if pre-meal CBG is >150 or if he skips the meal.  - A1c anticipated 02/2021 - Lifestyle/Diet heavily emphasized - Regular home monitoring of blood glucose emphasized   Time  spent face-to-face counseling: 30 minutes Follow-up pharmD appt in 3 weeks.  Butch Penny, PharmD, P[place]y [place], CPP Clinical Pharmacist [facility] [phone]

## 2021-01-05 ENCOUNTER — Encounter: Payer: Self-pay | Admitting: Critical Care Medicine

## 2021-01-05 ENCOUNTER — Other Ambulatory Visit (HOSPITAL_COMMUNITY): Payer: Self-pay

## 2021-01-05 ENCOUNTER — Other Ambulatory Visit: Payer: Self-pay | Admitting: Critical Care Medicine

## 2021-01-05 MED ORDER — CLOBETASOL PROPIONATE 0.05 % EX CREA
1.0000 "application " | TOPICAL_CREAM | Freq: Two times a day (BID) | CUTANEOUS | 0 refills | Status: DC
  Filled 2021-01-05: qty 30, 30d supply, fill #0
  Filled 2021-01-05: qty 30, 15d supply, fill #0
  Filled 2021-01-05 – 2021-01-06 (×2): qty 30, 30d supply, fill #0

## 2021-01-05 NOTE — Progress Notes (Signed)
Patient ID: Rodney Barber, male   DOB: 07-21-1966, 55 y.o.   MRN: [id number] This patient is seen in return follow-up with [name] on arrival blood glucose was 419 have been 183 earlier in the day he has been skipping his evening Humalog doses which he is to take 5 units in the morning and at dinner he has been taking his Lantus 40 units in the a.m.  The patient has no other complaints except for mild inflammation in the skin area  On exam blood pressure 100/50 pulse 6897% saturation chest was clear cardiac exam unremarkable abdomen benign skin shows eczematous changes along the back and forearms  Impression is eczema of the skin and type 1 diabetes not well controlled   Patient educated to use the Humalog twice daily along with the Lantus  Topical Temovate will be given to be mixed with skin moisturization

## 2021-01-06 ENCOUNTER — Other Ambulatory Visit: Payer: Self-pay | Admitting: *Deleted

## 2021-01-06 ENCOUNTER — Other Ambulatory Visit (HOSPITAL_COMMUNITY): Payer: Self-pay

## 2021-01-06 MED ORDER — ISOSORBIDE MONONITRATE ER 30 MG PO TB24
30.0000 mg | ORAL_TABLET | Freq: Every day | ORAL | 5 refills | Status: DC
  Filled 2021-01-06: qty 30, 30d supply, fill #0

## 2021-01-06 MED ORDER — INSULIN PEN NEEDLE 31G X 5 MM MISC
1 refills | Status: DC
  Filled 2021-01-06: qty 100, 30d supply, fill #0

## 2021-01-10 NOTE — Progress Notes (Deleted)
Cardiology Office Note:    Date:  01/10/2021   ID:  Rodney Barber, DOB February 16, 1966, MRN [id number]  PCP:  [name], MD  Cardiologist:  [name], MD   Referring MD: [name], MD   No chief complaint on file. ***  History of Present Illness:    Rodney Barber is a 55 y.o. male with a hx of HTN, CAD, chronic systolic and diastolic heart failure, DM2, HLD, severe malnutrition, substance use disorder, and tobacco abuse. He has a history of uncontrolled DM, chornic pancreatitis, noncompliance, and homelessness. Echo 05/2019 with EF 60-65% and pericardial effusion. Repeat echo 10/2019 EF 40-45%, garde 1 DD, small filamentous mobile echodensity unchanged from pkrio TEE. Admitted May 2021 with CHF exacerbation - echo with EF 35-40%. Myoview 01/01/20 was high risk suspicious for a previous MI and peri-infarct ischemia, he underwent heart cath showing diffuse coronary atherosclerosis with heavy calcification. Modest single vessel obstructive CAD involving mid RCA and severe LV dysfunction with EF 25-30%. LV dysfunction felt out of proportion to his degree of CAD.  Follow up echo 05/04/20 showed EF 35-40%, grade 1 DD, and no significant valvular disease. H e is managed with medical therapy for NICM and CAD. Dye to complaints of pain and cool extremities, he had ABIs which were normal. Last seen 04/19/20 and doing well, compliant on medications. He had COVID in March 2022.   He is maintained on ASA, lipitor, coreg, losartan, spironolacone, and torsemide. He is on insulin for DM. He presents today for scheduled follow up and preop clearance for  He was seen in the ER 10/06/20 wit COVID and UTI. Imaging found left kidney stone. No immediate intervention, but possible percutanous management of stone burden "down the road."   I saw him 12/10/20 for preop clearance but reported chest pain. Given his known CAD and prior abnormal nuclear stress test, I opted to treat medically and started 30 mg  imdur. If refractory chest pain, would consider repeat cath.   He presents today for follow up.     NICM Chronic systolic and diastolic heart failure - continue BB, ARB, spiro, torsemide   CAD - continue ASA, BB, lipitor   HLD 01/01/2020: VLDL 20 11/15/2020: Cholesterol, Total 111; HDL 36; LDL Chol Calc (NIH) 59; Triglycerides 79      Past Medical History:  Diagnosis Date  . 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 Diabetes mellitus   . Hypertension   . Pancreatitis     Past Surgical History:  Procedure Laterality Date  . ERCP  06/27/2011   Procedure: ENDOSCOPIC RETROGRADE CHOLANGIOPANCREATOGRAPHY (ERCP);  Surgeon: Petra Kuba, MD;  Location: [facility];  Service: Endoscopy;  Laterality: N/A;  . ERCP W/ PLASTIC STENT PLACEMENT  03/2011  . IR US GUIDE BX ASP/DRAIN  05/16/2019  . LEFT HEART CATH AND CORONARY ANGIOGRAPHY N/A 01/02/2020   Procedure: LEFT HEART CATH AND CORONARY ANGIOGRAPHY;  Surgeon: Swaziland, Peter M, MD;  Location[facility] INVASIVE CV LAB;  Service: Cardiovascular;  Laterality: N/A;  . TEE WITHOUT CARDIOVERSION N/A 05/20/2019   Procedure: TRANSESOPHAGEAL ECHOCARDIOGRAM (TEE);  Surgeon: Chrystie Nose, MD;  Location: [facility] ENDOSCOPY;  Service: Cardiovascular;  Laterality: N/A;    Current Medications: No outpatient medications have been marked as taking for the 01/19/21 encounter (Appointment) with Marcelino Duster, PA.   Current Facility-Administered Medications for the 01/19/21 encounter (Appointment) with Marcelino Duster, PA  Medication  . Insta-Glucose 77.4 % GEL 1 Tube     Allergies:  Patient has no known allergies.   Social History   Socioeconomic History  . Marital status: Single    Spouse name: Not on file  . Number of children: Not on file  . Years of education: Not on file  . Highest education level: Not on file  Occupational History  . Not on file  Tobacco Use  . Smoking status: Current Every Day Smoker    Packs/day:  0.50    Years: 30.00    Pack years: 15.00    Types: Cigarettes  . Smokeless tobacco: Never Used  . Tobacco comment: 6 cigarett /day  Vaping Use  . Vaping Use: Never used  Substance and Sexual Activity  . Alcohol use: No  . Drug use: Not Currently    Types: "Crack" cocaine, Other-see comments, Cocaine    Comment: last smoked crack 12-16-16  . Sexual activity: Never  Other Topics Concern  . Not on file  Social History Narrative  . Not on file   Social Determinants of Health   Financial Resource Strain: Not on file  Food Insecurity: Not on file  Transportation Needs: Not on file  Physical Activity: Not on file  Stress: Not on file  Social Connections: Not on file     Family History: The patient's ***family history includes Diabetes in his brother and mother.  ROS:   Please see the history of present illness.    *** All other systems reviewed and are negative.  EKGs/Labs/Other Studies Reviewed:    The following studies were reviewed today: ***  EKG:  EKG is *** ordered today.  The ekg ordered today demonstrates ***  Recent Labs: 11/28/2020: ALT 18; BUN 32; Creatinine, Ser 1.34; Hemoglobin 9.9; Platelets 204; Potassium 3.7; Sodium 138  Recent Lipid Panel    Component Value Date/Time   CHOL 111 11/15/2020 1457   TRIG 79 11/15/2020 1457   HDL 36 (L) 11/15/2020 1457   CHOLHDL 3.1 11/15/2020 1457   CHOLHDL 3.7 01/01/2020 0550   VLDL 20 01/01/2020 0550   LDLCALC 59 11/15/2020 1457    Physical Exam:    VS:  There were no vitals taken for this visit.    Wt Readings from Last 3 Encounters:  12/10/20 139 lb 6.4 oz (63.2 kg)  11/28/20 139 lb 12.8 oz (63.4 kg)  11/15/20 139 lb 12.8 oz (63.4 kg)     GEN: *** Well nourished, well developed in no acute distress HEENT: Normal NECK: No JVD; No carotid bruits LYMPHATICS: No lymphadenopathy CARDIAC: ***RRR, no murmurs, rubs, gallops RESPIRATORY:  Clear to auscultation without rales, wheezing or rhonchi  ABDOMEN: Soft,  non-tender, non-distended MUSCULOSKELETAL:  No edema; No deformity  SKIN: Warm and dry NEUROLOGIC:  Alert and oriented x 3 PSYCHIATRIC:  Normal affect   ASSESSMENT:    No diagnosis found. PLAN:    In order of problems listed above:  No diagnosis found.   Medication Adjustments/Labs and Tests Ordered: Current medicines are reviewed at length with the patient today.  Concerns regarding medicines are outlined above.  No orders of the defined types were placed in this encounter.  No orders of the defined types were placed in this encounter.   Signed, Marcelino Duster, PA  01/10/2021 3:20 PM    [facility]

## 2021-01-12 ENCOUNTER — Encounter: Payer: Self-pay | Admitting: Critical Care Medicine

## 2021-01-12 NOTE — Progress Notes (Signed)
Patient ID: Rodney Barber, male   DOB: 1965-10-27, 55 y.o.   MRN: [id number] This is a brief Proofreader visit for this 55 year old male with type 2 diabetes  Blood pressure 110/60 pulse 70 saturation 96% room air blood sugar 330  Exam is unremarkable  We went over his insulin and other medication supplies he has plenty of supplies  We will monitor this patient for now

## 2021-01-13 ENCOUNTER — Other Ambulatory Visit (HOSPITAL_COMMUNITY): Payer: Self-pay

## 2021-01-13 ENCOUNTER — Other Ambulatory Visit: Payer: Self-pay | Admitting: *Deleted

## 2021-01-13 MED ORDER — FERROUS SULFATE DRIED 200 (65 FE) MG PO TABS
ORAL_TABLET | ORAL | 1 refills | Status: DC
  Filled 2021-01-13: qty 60, fill #0

## 2021-01-13 MED ORDER — ISOSORBIDE MONONITRATE ER 30 MG PO TB24
30.0000 mg | ORAL_TABLET | Freq: Every day | ORAL | 5 refills | Status: DC
  Filled 2021-01-13: qty 30, 30d supply, fill #0

## 2021-01-19 ENCOUNTER — Other Ambulatory Visit: Payer: Self-pay | Admitting: *Deleted

## 2021-01-19 ENCOUNTER — Other Ambulatory Visit (HOSPITAL_COMMUNITY): Payer: Self-pay

## 2021-01-19 ENCOUNTER — Encounter: Payer: Self-pay | Admitting: Critical Care Medicine

## 2021-01-19 ENCOUNTER — Ambulatory Visit: Payer: Self-pay | Admitting: Physician Assistant

## 2021-01-19 ENCOUNTER — Other Ambulatory Visit: Payer: Self-pay | Admitting: Critical Care Medicine

## 2021-01-19 MED ORDER — TRIAMCINOLONE ACETONIDE 0.1 % EX CREA
1.0000 "application " | TOPICAL_CREAM | Freq: Two times a day (BID) | CUTANEOUS | 0 refills | Status: DC
  Filled 2021-01-19: qty 30, 15d supply, fill #0

## 2021-01-19 MED ORDER — INSULIN LISPRO (1 UNIT DIAL) 100 UNIT/ML (KWIKPEN)
5.0000 [IU] | PEN_INJECTOR | Freq: Two times a day (BID) | SUBCUTANEOUS | 1 refills | Status: DC
  Filled 2021-01-19: qty 6, 60d supply, fill #0

## 2021-01-19 MED ORDER — INSULIN LISPRO (1 UNIT DIAL) 100 UNIT/ML (KWIKPEN)
5.0000 [IU] | PEN_INJECTOR | Freq: Two times a day (BID) | SUBCUTANEOUS | 1 refills | Status: DC
  Filled 2021-01-19: qty 3, 28d supply, fill #0
  Filled 2021-02-24: qty 3, 28d supply, fill #1
  Filled 2021-03-24: qty 3, 28d supply, fill #2
  Filled 2021-04-14: qty 3, 28d supply, fill #3

## 2021-01-20 ENCOUNTER — Other Ambulatory Visit: Payer: Self-pay

## 2021-01-20 ENCOUNTER — Ambulatory Visit: Payer: Self-pay | Attending: Critical Care Medicine | Admitting: Pharmacist

## 2021-01-20 ENCOUNTER — Other Ambulatory Visit (HOSPITAL_COMMUNITY): Payer: Self-pay

## 2021-01-20 DIAGNOSIS — E0865 Diabetes mellitus due to underlying condition with hyperglycemia: Secondary | ICD-10-CM

## 2021-01-20 DIAGNOSIS — K8689 Other specified diseases of pancreas: Secondary | ICD-10-CM

## 2021-01-20 DIAGNOSIS — IMO0002 Reserved for concepts with insufficient information to code with codable children: Secondary | ICD-10-CM

## 2021-01-20 NOTE — Progress Notes (Signed)
Patient ID: Rodney Barber, male   DOB: 26-Jul-1966, 55 y.o.   MRN: [id number]  This a 55 year old male history of type 2 diabetes chronic pancreatitis pancreatic insufficiency  Today what he came to the shelter for a blood sugar check was 256 vital signs are stable his sugar tends to run in the 100 range in the mornings  His exam is unremarkable  He has a visit with clinical pharmacy on the 16th he is encouraged to keep this appointment he did miss his cardiology appointment today he will get this rescheduled  Patient's medications were refilled no change in insulin dosing he will follow-up with clinical pharmacy at the visit June 16

## 2021-01-20 NOTE — Progress Notes (Signed)
    S:    PCP: Dr. [name]   No chief complaint on file.  Patient arrives in good spirits. Presents for diabetes evaluation, education, and management. Was seen last and referred by Dr. [name] on 01/19/2021. I saw him last on 12/30/2020 and made no adjustments to his regimen.   Today, he reports doing better overall. Denies hypoglycemia. This morning, his CBG was 197 prior to his appointment. He took 5u of Humalog and ate his breakfast before seeing me. He has also taken his Lantus this morning. Patient denies polyuria, polydipsia.   Patient reports adherence with medications.   Current diabetes medications include:  - Lantus 40u daily - Humalog 5u BID before meals  Dietary changes:  - Pt's diet is limited based on availability at the [facility]   Physical Activity:  - Limited   O:  POCT: 140 (ate earlier this AM before his appointment)  Lab Results  Component Value Date   HGBA1C 9.6 (A) 11/15/2020   There were no vitals filed for this visit.   Patient does not have his glucometer. Sugar levels vary. Per pt, he spends most of his time in the 100s. He tells me today that he sees most his levels are "staying down". Does admit to seeing outliers in the 200-300 range but these occur only 1-2x per week per pt.   A/P: Diabetes longstanding currently uncontrolled. Patient is able to verbalize understanding of a hypoglycemia management plan. He is adherent with his insulin unless he feels his sugar is too low. Hx of hypoglycemia and labile blood sugar levels limit therapy adjustment. For now, will continue current regimen.  - Continue Lantus 40 units daily. Pt instructed to take in the morning if fasting CBG is >100.  - Continue Humalog 5u BID before meals. Pt instructed to skip this if pre-meal CBG is >150 or if he skips the meal.  - A1c anticipated 02/2021 - Lifestyle/Diet heavily emphasized - Regular home monitoring of blood glucose emphasized   Time spent face-to-face counseling:  30 minutes Follow-up pharmD appt in 4 weeks.  Butch Penny, PharmD, Patsy [place], CPP Clinical Pharmacist [facility] & [facility] [phone]

## 2021-01-21 ENCOUNTER — Encounter: Payer: Self-pay | Admitting: Pharmacist

## 2021-01-21 LAB — GLUCOSE, POCT (MANUAL RESULT ENTRY): POC Glucose: 140 mg/dl — AB (ref 70–99)

## 2021-01-27 ENCOUNTER — Other Ambulatory Visit: Payer: Self-pay | Admitting: *Deleted

## 2021-01-27 ENCOUNTER — Other Ambulatory Visit (HOSPITAL_COMMUNITY): Payer: Self-pay

## 2021-01-27 MED ORDER — ATORVASTATIN CALCIUM 80 MG PO TABS
80.0000 mg | ORAL_TABLET | Freq: Every day | ORAL | 2 refills | Status: DC
  Filled 2021-01-27: qty 30, 30d supply, fill #0

## 2021-01-27 MED ORDER — PANCRELIPASE (LIP-PROT-AMYL) 6000-19000 UNITS PO CPEP
ORAL_CAPSULE | ORAL | 0 refills | Status: DC
  Filled 2021-01-27: qty 180, 30d supply, fill #0

## 2021-01-27 MED ORDER — LOSARTAN POTASSIUM 100 MG PO TABS
100.0000 mg | ORAL_TABLET | Freq: Every day | ORAL | 5 refills | Status: DC
  Filled 2021-01-27: qty 30, 30d supply, fill #0

## 2021-01-27 MED ORDER — GLUCOSE BLOOD VI STRP
1.0000 | ORAL_STRIP | Freq: Three times a day (TID) | 12 refills | Status: DC
  Filled 2021-01-27: qty 100, 34d supply, fill #0

## 2021-01-27 MED ORDER — ISOSORBIDE MONONITRATE ER 30 MG PO TB24
30.0000 mg | ORAL_TABLET | Freq: Every day | ORAL | 5 refills | Status: DC
  Filled 2021-01-27: qty 30, 30d supply, fill #0

## 2021-01-27 MED ORDER — SPIRONOLACTONE 25 MG PO TABS
25.0000 mg | ORAL_TABLET | Freq: Every day | ORAL | 5 refills | Status: DC
  Filled 2021-01-27: qty 30, 30d supply, fill #0

## 2021-01-27 MED ORDER — FERROUS SULFATE DRIED 200 (65 FE) MG PO TABS
200.0000 mg | ORAL_TABLET | Freq: Every day | ORAL | 3 refills | Status: DC
  Filled 2021-01-27: qty 60, fill #0

## 2021-01-27 MED ORDER — INSULIN GLARGINE 100 UNIT/ML SOLOSTAR PEN
40.0000 [IU] | PEN_INJECTOR | Freq: Every day | SUBCUTANEOUS | 11 refills | Status: DC
  Filled 2021-01-27: qty 12, 30d supply, fill #0

## 2021-02-02 ENCOUNTER — Encounter: Payer: Self-pay | Admitting: Critical Care Medicine

## 2021-02-02 ENCOUNTER — Other Ambulatory Visit (HOSPITAL_COMMUNITY): Payer: Self-pay

## 2021-02-02 ENCOUNTER — Other Ambulatory Visit: Payer: Self-pay | Admitting: Critical Care Medicine

## 2021-02-02 MED ORDER — ATORVASTATIN CALCIUM 80 MG PO TABS: 80.0000 mg | ORAL_TABLET | Freq: Every day | ORAL | 2 refills | Status: DC

## 2021-02-02 MED ORDER — PANTOPRAZOLE SODIUM 40 MG PO TBEC
40.0000 mg | DELAYED_RELEASE_TABLET | Freq: Every day | ORAL | 5 refills | Status: DC
  Filled 2021-02-02: qty 30, 30d supply, fill #0
  Filled 2021-03-17: qty 30, 30d supply, fill #1

## 2021-02-02 MED ORDER — FERROUS SULFATE DRIED 200 (65 FE) MG PO TABS
200.0000 mg | ORAL_TABLET | Freq: Every day | ORAL | 3 refills | Status: DC
  Filled 2021-02-02: qty 60, fill #0

## 2021-02-02 MED ORDER — LOSARTAN POTASSIUM 100 MG PO TABS: 100.0000 mg | ORAL_TABLET | Freq: Every day | ORAL | 5 refills | Status: DC

## 2021-02-02 MED ORDER — GLUCOSE BLOOD VI STRP
ORAL_STRIP | 12 refills | Status: DC
  Filled 2021-02-02: qty 100, 34d supply, fill #0
  Filled 2021-02-15: qty 100, 30d supply, fill #0
  Filled 2021-04-29: qty 100, 30d supply, fill #1

## 2021-02-02 MED ORDER — PANCRELIPASE (LIP-PROT-AMYL) 6000-19000 UNITS PO CPEP: ORAL_CAPSULE | ORAL | 0 refills | Status: DC

## 2021-02-02 MED ORDER — ISOSORBIDE MONONITRATE ER 30 MG PO TB24: 30.0000 mg | ORAL_TABLET | Freq: Every day | ORAL | 5 refills | Status: DC

## 2021-02-02 MED ORDER — SPIRONOLACTONE 25 MG PO TABS: 25.0000 mg | ORAL_TABLET | Freq: Every day | ORAL | 5 refills | Status: DC

## 2021-02-02 MED ORDER — TORSEMIDE 20 MG PO TABS
20.0000 mg | ORAL_TABLET | Freq: Every day | ORAL | 6 refills | Status: DC
  Filled 2021-02-02: qty 30, 30d supply, fill #0

## 2021-02-02 MED ORDER — CARVEDILOL 25 MG PO TABS
25.0000 mg | ORAL_TABLET | Freq: Two times a day (BID) | ORAL | 5 refills | Status: DC
  Filled 2021-02-02: qty 60, 30d supply, fill #0

## 2021-02-03 NOTE — Progress Notes (Signed)
Patient is seen today in the [facility] clinic for blood sugar check was 240 he is out of test strips plan is to refill his true Metrix test strips we also refilled his pill organizer he is doing extremely well in his program

## 2021-02-09 ENCOUNTER — Encounter: Payer: Self-pay | Admitting: Critical Care Medicine

## 2021-02-09 ENCOUNTER — Other Ambulatory Visit (HOSPITAL_COMMUNITY): Payer: Self-pay

## 2021-02-09 ENCOUNTER — Other Ambulatory Visit: Payer: Self-pay | Admitting: *Deleted

## 2021-02-09 MED ORDER — TRIAMCINOLONE ACETONIDE 0.1 % EX CREA
1.0000 "application " | TOPICAL_CREAM | Freq: Two times a day (BID) | CUTANEOUS | 0 refills | Status: DC
  Filled 2021-02-09: qty 30, 15d supply, fill #0

## 2021-02-09 MED ORDER — INSULIN PEN NEEDLE 31G X 5 MM MISC
6 refills | Status: DC
  Filled 2021-02-09: qty 100, 30d supply, fill #0
  Filled 2021-03-24: qty 100, 30d supply, fill #1

## 2021-02-10 NOTE — Progress Notes (Signed)
Patient ID: Rodney Barber, male   DOB: 1966-04-21, 55 y.o.   MRN: [id number] This is a 55 year old male with history of diabetes hypertension coronary disease seen at the [facility] shelter clinic he has been complaining of increased left flank pain and does have a large kidney stone in his collecting area of the left kidney where he had infection in the past.  He has been outside a great deal number and a current heat wave.  He denies any fever no change in urine except he still has frequent urination with his elevated blood sugars on arrival blood sugars 242 pulse 67 saturation 97% blood pressure 94/60 temperature 96.7  On exam chest is clear cardiac exam unremarkable chest is otherwise unremarkable abdomen soft nontender left flank is slightly tender impression is that he has some volume depletion he is on torsemide in the heat may be concentrated urine irritating the left kidney area with a kidney stone  Plan is for the patient to hydrate more and we gave the patient a Pedialyte bottle to hydrate with today we will also hold his torsemide for 3 days then reduce to 3 times weekly only also endeavor to get him back to see urology for his kidney stone

## 2021-02-15 ENCOUNTER — Other Ambulatory Visit (HOSPITAL_COMMUNITY): Payer: Self-pay

## 2021-02-16 ENCOUNTER — Other Ambulatory Visit: Payer: Self-pay | Admitting: Critical Care Medicine

## 2021-02-16 ENCOUNTER — Encounter: Payer: Self-pay | Admitting: Critical Care Medicine

## 2021-02-16 MED ORDER — TORSEMIDE 20 MG PO TABS: ORAL_TABLET | ORAL | 6 refills | Status: DC

## 2021-02-16 NOTE — Progress Notes (Signed)
Patient ID: Rodney Barber, male   DOB: [birth year], 55 y.o.   MRN: [id number] This patient seen today in the [place] [facility] clinic 55 year old male with type 2 diabetes hypertension cardiovascular disease  Blood sugars 114 which is the best its been blood pressure 92/50 pulse is 68 saturation 99% room air  Patient appears to have decreased skin turgor he complains of some dizziness lightheadedness  Plan is to hydrate this patient and to hold his torsemide for now no changes other medication

## 2021-02-21 ENCOUNTER — Other Ambulatory Visit (HOSPITAL_COMMUNITY): Payer: Self-pay

## 2021-02-21 ENCOUNTER — Ambulatory Visit: Payer: Self-pay | Attending: Critical Care Medicine | Admitting: Pharmacist

## 2021-02-21 ENCOUNTER — Other Ambulatory Visit: Payer: Self-pay

## 2021-02-21 DIAGNOSIS — IMO0002 Reserved for concepts with insufficient information to code with codable children: Secondary | ICD-10-CM

## 2021-02-21 DIAGNOSIS — K8689 Other specified diseases of pancreas: Secondary | ICD-10-CM

## 2021-02-21 DIAGNOSIS — E0865 Diabetes mellitus due to underlying condition with hyperglycemia: Secondary | ICD-10-CM

## 2021-02-21 LAB — POCT GLYCOSYLATED HEMOGLOBIN (HGB A1C): Hemoglobin A1C: 8.7 % — AB (ref 4.0–5.6)

## 2021-02-21 MED ORDER — INSULIN LISPRO (1 UNIT DIAL) 100 UNIT/ML (KWIKPEN)
7.0000 [IU] | PEN_INJECTOR | Freq: Two times a day (BID) | SUBCUTANEOUS | 1 refills | Status: DC
  Filled 2021-02-21: qty 3, 21d supply, fill #0

## 2021-02-21 NOTE — Progress Notes (Signed)
    S:    PCP: Dr. [name]   No chief complaint on file.  Patient arrives in good spirits. Presents for diabetes evaluation, education, and management. Was seen last and referred by Dr. [name] on 01/19/2021. I saw him last on 01/20/2021 and made no adjustments to his regimen.   Today, he reports doing better overall. Denies hypoglycemia. He took 5u of Humalog and ate his breakfast before seeing me. He has also taken his Lantus this morning. Patient denies polyuria, polydipsia.   Patient reports adherence with medications.   Current diabetes medications include:  - Lantus 40u daily - Humalog 5u BID before meals - he admits that he still injects after meals on most days  Dietary changes:  - Pt's diet is limited based on availability at the [facility]   Physical Activity:  - Limited   O:  POCT: 188 (ate earlier this AM before his appointment)  Lab Results  Component Value Date   HGBA1C 8.7 (A) 02/21/2021   There were no vitals filed for this visit.   Patient does not have his glucometer. Sugar levels vary. Per pt, he spends most of his time in the 100s. He tells me today that he sees most his levels are "staying down". Does admit to seeing outliers in the 200-300 range but these occur only 1-2x per week per pt.   A/P: Diabetes longstanding currently uncontrolled, however, number is improving. A1c today down from 9.6 to 8.7%. Patient is able to verbalize understanding of a hypoglycemia management plan. He is adherent with his insulin unless he feels his sugar is too low. Hx of hypoglycemia and labile blood sugar levels limit therapy adjustment. I have instructed him Humalog slightly.  - Continue Lantus 40 units daily. Pt instructed to take in the morning if fasting CBG is >100.  - Increase Humalog to 7u BID before meals. Pt instructed to skip this if pre-meal CBG is >150 or if he skips the meal.  - A1c anticipated 05/2021 - Lifestyle/Diet heavily emphasized - Regular home  monitoring of blood glucose emphasized   Time spent face-to-face counseling: 30 minutes Follow-up w/ PCP next month.  Butch Penny, PharmD, Patsy [place], CPP Clinical Pharmacist [facility] & [facility] [phone]

## 2021-02-23 ENCOUNTER — Encounter: Payer: Self-pay | Admitting: Critical Care Medicine

## 2021-02-23 ENCOUNTER — Other Ambulatory Visit: Payer: Self-pay | Admitting: Critical Care Medicine

## 2021-02-23 ENCOUNTER — Other Ambulatory Visit (HOSPITAL_COMMUNITY): Payer: Self-pay

## 2021-02-23 MED ORDER — INSULIN LISPRO (1 UNIT DIAL) 100 UNIT/ML (KWIKPEN): 7.0000 [IU] | PEN_INJECTOR | Freq: Two times a day (BID) | SUBCUTANEOUS | 1 refills | Status: DC

## 2021-02-23 MED ORDER — INSULIN GLARGINE 100 UNIT/ML SOLOSTAR PEN
40.0000 [IU] | PEN_INJECTOR | Freq: Every day | SUBCUTANEOUS | 11 refills | Status: DC
  Filled 2021-02-23: qty 12, 30d supply, fill #0
  Filled 2021-03-24: qty 12, 30d supply, fill #1

## 2021-02-24 ENCOUNTER — Other Ambulatory Visit (HOSPITAL_COMMUNITY): Payer: Self-pay

## 2021-02-24 ENCOUNTER — Other Ambulatory Visit: Payer: Self-pay | Admitting: *Deleted

## 2021-02-24 MED ORDER — SPIRONOLACTONE 25 MG PO TABS
25.0000 mg | ORAL_TABLET | Freq: Every day | ORAL | 5 refills | Status: DC
  Filled 2021-02-24: qty 30, 30d supply, fill #0

## 2021-02-24 MED ORDER — ATORVASTATIN CALCIUM 80 MG PO TABS
80.0000 mg | ORAL_TABLET | Freq: Every day | ORAL | 2 refills | Status: DC
  Filled 2021-02-24: qty 30, 30d supply, fill #0

## 2021-02-24 NOTE — Progress Notes (Signed)
This patient was seen today in the [place] [facility] clinic he has type 2 diabetes and comes to the clinic doing quite well his A1c now is down to 8.7 from 14-1/2 he is maintaining his Lantus and Humalog insulin he does need refills on both insulin supplies blood pressure 110/62 pulse 62 saturation 98% room air CBG today was 179  Exam is unremarkable  Praised the patient for the progress he is made in his diabetes control  Cardiovascular is also stable  I will call [facility] to see where things stand with his disability determination

## 2021-02-25 ENCOUNTER — Other Ambulatory Visit (HOSPITAL_COMMUNITY): Payer: Self-pay

## 2021-03-03 ENCOUNTER — Other Ambulatory Visit (HOSPITAL_COMMUNITY): Payer: Self-pay

## 2021-03-03 ENCOUNTER — Other Ambulatory Visit: Payer: Self-pay | Admitting: *Deleted

## 2021-03-03 MED ORDER — LOSARTAN POTASSIUM 100 MG PO TABS
100.0000 mg | ORAL_TABLET | Freq: Every day | ORAL | 5 refills | Status: DC
  Filled 2021-03-03: qty 30, 30d supply, fill #0

## 2021-03-03 MED ORDER — ISOSORBIDE MONONITRATE ER 30 MG PO TB24
30.0000 mg | ORAL_TABLET | Freq: Every day | ORAL | 5 refills | Status: DC
  Filled 2021-03-03: qty 30, 30d supply, fill #0

## 2021-03-03 MED ORDER — CARVEDILOL 25 MG PO TABS
25.0000 mg | ORAL_TABLET | Freq: Two times a day (BID) | ORAL | 5 refills | Status: DC
  Filled 2021-03-03: qty 60, 30d supply, fill #0

## 2021-03-09 ENCOUNTER — Encounter: Payer: Self-pay | Admitting: Physician Assistant

## 2021-03-09 ENCOUNTER — Other Ambulatory Visit: Payer: Self-pay | Admitting: Critical Care Medicine

## 2021-03-09 ENCOUNTER — Other Ambulatory Visit (HOSPITAL_COMMUNITY): Payer: Self-pay

## 2021-03-09 MED ORDER — TRIAMCINOLONE ACETONIDE 0.1 % EX CREA
1.0000 "application " | TOPICAL_CREAM | Freq: Two times a day (BID) | CUTANEOUS | 0 refills | Status: DC
  Filled 2021-03-09: qty 30, 15d supply, fill #0

## 2021-03-09 NOTE — Progress Notes (Signed)
Pt seen by Dr [name].  Rodney Barber has been checking his CBGs, they have been in the 100s-200s.  He takes Lantus 40 U daily and Humalog 7 U bid.  The battery ran out on his meter, he is using the clinic meter till he got another battery. His meter works today.  [facility] has not called back.  CBG 109, 98% O2, HR 66, 98/40.  Pt given Pedialyte, he's not sure he had enough water today.  He is out of the Triamcinolone cream, requests a refill.   Generally doing well, using a walker.   Rodney Demark, PA-C 03/09/2021 2:29 PM

## 2021-03-10 ENCOUNTER — Other Ambulatory Visit (HOSPITAL_COMMUNITY): Payer: Self-pay

## 2021-03-16 ENCOUNTER — Encounter: Payer: Self-pay | Admitting: Physician Assistant

## 2021-03-16 NOTE — Progress Notes (Signed)
Cardiology Office Note   Date:  03/18/2021   ID:  Rodney Barber, DOB 1965/09/05, MRN [id number]  PCP:  Elsie Stain, MD  Cardiologist: Dr. Martinique CC: Follow Up    History of Present Illness: Rodney Barber is a 55 y.o. male who presents for ongoing assessment and management of hypertension, chronic systolic and diastolic heart failure, history of CAD, hyperlipidemia, with other history to include type 2 diabetes, severe malnutrition, substance abuse disorder, and tobacco abuse.  He has a history of uncontrolled diabetes with associated chronic pancreatitis, noncompliance, and homelessness.  His echocardiogram dated 05/27/2019 revealed an EF of 60 to 65% with a pericardial effusion.  Repeat echocardiogram 1 year later revealed an EF of 40 to 45% with grade 1 diastolic dysfunction, small filamentous mobile echodensity unchanged from prior TEE.  He was admitted May 2021 with decompensated CHF echocardiogram revealed further decrease in ejection fraction of 35% to 40%.  Myoview at that time was high risk suspicious for previous MI and peri-infarct ischemia.  He underwent heart catheterization revealing diffuse coronary atherosclerosis with heavy calcifications.  He had modest single-vessel obstructive CAD involving the mid RCA and severe LV dysfunction with an EF of 25 to 30%.  The decreased LV function was felt to be out of proportion to his burden of CAD.  A follow-up echocardiogram 3 months later on 05/04/2020 showed an EF of 35 to 40% with grade 1 diastolic dysfunction and no significant valvular disease.  He was managed with medical therapy for nonischemic cardiomyopathy and CAD.  Medical regimen includes aspirin, Lipitor, Coreg, losartan, spironolactone, and torsemide.  He is also on insulin for diabetes.  It was noted in March 2022 he was admitted with COVID-19 and a UTI.  He was also found to have a kidney stone on imaging.  He was continued to treat medically with no intervention of  kidney stone.  Was last seen on 12/10/2020 with complaints of chest discomfort which she described as dull at rest, intermediate lasting 1 to 2 minutes.  His chest pain historically has occurred with rest but not when he is exertional.  The pain is located at the center of his chest without radiation no associated dyspnea nausea or diaphoresis.  It was noted that he was medically compliant at that time and living at [facility].  On last office visit dated 12/10/2020, he was continued on medical therapy but was started on Imdur 30 mg at at bedtime for his chest discomfort.  It was not felt that he warranted a repeat stress test or cardiac catheterization at that time with known chronic kidney disease with recent creatinine of 1.34.  It was hopeful that nitrates would be helpful symptomatically.  If he had continued recurrence of discomfort low threshold to plan for recath.  Is followed closely by Dr. Asencion Noble, medications are provided to him through [facility] assistance, and delivered to his place of residence at the homeless shelter to make sure he receives all that he needs.  The patient states that he does get tired and has some mild dizziness, and weakness in his legs.  He uses a walker for ambulation.  He is medically compliant as he does receive his medications, and they are given to him each morning.  He denies any muscle cramps, changes in his chest discomfort, or palpitations.  He has had recent labs and April 2022.  Past Medical History:  Diagnosis Date   Alcohol-induced chronic pancreatitis ([place]) 07/22/2013   Diabetes mellitus  Hypertension    Pancreatitis     Past Surgical History:  Procedure Laterality Date   ERCP  06/27/2011   Procedure: ENDOSCOPIC RETROGRADE CHOLANGIOPANCREATOGRAPHY (ERCP);  Surgeon: Jeryl Columbia, MD;  Location: [facility];  Service: Endoscopy;  Laterality: N/A;   ERCP W/ PLASTIC STENT PLACEMENT  03/2011   IR US GUIDE BX ASP/DRAIN  05/16/2019   LEFT HEART CATH  AND CORONARY ANGIOGRAPHY N/A 01/02/2020   Procedure: LEFT HEART CATH AND CORONARY ANGIOGRAPHY;  Surgeon: Martinique, Peter M, MD;  Location: [facility];  Service: Cardiovascular;  Laterality: N/A;   TEE WITHOUT CARDIOVERSION N/A 05/20/2019   Procedure: TRANSESOPHAGEAL ECHOCARDIOGRAM (TEE);  Surgeon: [name], MD;  Location: [facility];  Service: Cardiovascular;  Laterality: N/A;     Current Outpatient Medications  Medication Sig Dispense Refill   aspirin 81 MG chewable tablet CHEW 1 TABLET (81 MG TOTAL) BY MOUTH DAILY. (Patient taking differently: Chew 81 mg by mouth daily.) 120 tablet 0   atorvastatin (LIPITOR) 80 MG tablet Take 1 tablet (80 mg total) by mouth daily at 6pm 60 tablet 2   blood glucose meter kit and supplies KIT Dispense based on patient and insurance preference. Use up to four times daily as directed. (FOR ICD-9 250.00, 250.01). 1 each 0   carvedilol (COREG) 25 MG tablet Take 1 tablet (25 mg total) by mouth 2 (two) times daily with a meal. 60 tablet 5   clobetasol cream (TEMOVATE) 7.93 % Apply 1 application topically 2 (two) times daily. 30 g 0   feeding supplement, GLUCERNA SHAKE, (GLUCERNA SHAKE) LIQD Take 237 mLs by mouth 3 (three) times daily between meals.  0   Ferrous Sulfate Dried 200 (65 Fe) MG TABS TAKE 200 MG BY MOUTH 2 (TWO) TIMES DAILY WITH A MEAL. 60 tablet 3   Glucose 4-6 GM-MG CHEW Chew one as needed for blood glucose < 70 (Patient taking differently: Chew 1 tablet by mouth 2 (two) times daily as needed (low glucose). Chew one as needed for blood glucose < 70) 100 tablet 0   glucose blood test strip Use as directed 3 times daily to test blood sugar. 100 strip 12   ibuprofen (ADVIL) 200 MG tablet Take 400 mg by mouth every 6 (six) hours as needed for fever, headache or mild pain.     insulin glargine (LANTUS) 100 UNIT/ML Solostar Pen Inject 40 Units into the skin daily. CONGREGATIONAL NURSE PROGRAM FUND 15 mL 11   insulin lispro (HUMALOG) 100 UNIT/ML  KwikPen Inject 5 Units into the skin 2 (two) times daily before a meal. Skip your dose if you do not eat or if your sugar is <150. 6 mL 1   insulin lispro (HUMALOG) 100 UNIT/ML KwikPen Inject 7 Units into the skin 2 (two) times daily before a meal. Skip your dose if you do not eat or if your sugar is <150. 6 mL 1   Insulin Pen Needle 31G X 5 MM MISC USE WITH INSULIN PENS 100 each 6   losartan (COZAAR) 100 MG tablet Take 1 tablet (100 mg total) by mouth daily. 30 tablet 5   nitroGLYCERIN (NITROSTAT) 0.4 MG SL tablet Place 1 tablet (0.4 mg total) under the tongue every 5 (five) minutes as needed for chest pain. 100 tablet 3   Pancrelipase, Lip-Prot-Amyl, 6000-19000 units CPEP TAKE 2 CAPSULES (12,000 UNITS TOTAL) BY MOUTH IN THE MORNING, AT NOON, AND AT BEDTIME. 360 capsule 0   pantoprazole (PROTONIX) 40 MG tablet Take 1 tablet (  40 mg total) by mouth daily. 30 tablet 5   spironolactone (ALDACTONE) 25 MG tablet Take 1 tablet (25 mg total) by mouth daily. 60 tablet 5   torsemide (DEMADEX) 20 MG tablet HOLD 30 tablet 6   triamcinolone cream (KENALOG) 0.1 % Apply 1 application topically 2 (two) times daily. 30 g 0   Current Facility-Administered Medications  Medication Dose Route Frequency Provider Last Rate Last Admin   Insta-Glucose 77.4 % GEL 1 Tube  1 Tube Oral Once Elsie Stain, MD        Allergies:   Patient has no known allergies.    Social History:  The patient  reports that he has been smoking cigarettes. He has a 15.00 pack-year smoking history. He has never used smokeless tobacco. He reports that he does not currently use drugs after having used the following drugs: "Crack" cocaine, Other-see comments, and Cocaine. He reports that he does not drink alcohol.   Family History:  The patient's family history includes Diabetes in his brother and mother.    ROS: All other systems are reviewed and negative. Unless otherwise mentioned in H&P    PHYSICAL EXAM: VS:  BP (!) 85/49 (BP  Location: Left Arm, Patient Position: Sitting, Cuff Size: Normal)   Pulse 62   Ht 5' 8"  (1.727 m)   Wt 135 lb 6.4 oz (61.4 kg)   SpO2 98%   BMI 20.59 kg/m  , BMI Body mass index is 20.59 kg/m. GEN: Well nourished, well developed, in no acute distress HEENT: normal Neck: no JVD, right carotid bruit, or Monday or masses Cardiac: RRR; 1/6 systolic and diastolic murmur heard best at the left sternal border murmurs, rubs, or gallops,no edema  Respiratory:  Clear to auscultation bilaterally, normal work of breathing GI: soft, nontender, nondistended, + BS MS: no deformity or atrophy Skin: warm and dry, no rash Neuro:  Strength and sensation are intact Psych: euthymic mood, full affect   EKG:  EKG (personally reviewed) normal sinus rhythm with LVH noted mild repolarization abnormalities are noted V4 and V5.  Heart rate of 62 bpm  Recent Labs: 11/28/2020: ALT 18; BUN 32; Creatinine, Ser 1.34; Hemoglobin 9.9; Platelets 204; Potassium 3.7; Sodium 138    Lipid Panel    Component Value Date/Time   CHOL 111 11/15/2020 1457   TRIG 79 11/15/2020 1457   HDL 36 (L) 11/15/2020 1457   CHOLHDL 3.1 11/15/2020 1457   CHOLHDL 3.7 01/01/2020 0550   VLDL 20 01/01/2020 0550   LDLCALC 59 11/15/2020 1457      Wt Readings from Last 3 Encounters:  03/18/21 135 lb 6.4 oz (61.4 kg)  12/10/20 139 lb 6.4 oz (63.2 kg)  11/28/20 139 lb 12.8 oz (63.4 kg)      Other studies Reviewed: Left heart cath 01/02/20: Prox LAD to Dist LAD lesion is 30% stenosed. Mid Cx to Dist Cx lesion is 60% stenosed. Mid RCA lesion is 70% stenosed. There is severe left ventricular systolic dysfunction. LV end diastolic pressure is mildly elevated. The left ventricular ejection fraction is 25-35% by visual estimate.   1. Diffuse coronary atherosclerosis with heavy calcification. 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 Severe LV dysfunction EF estimated at 25-30% 3. Elevated EDP 24 mm Hg   Plan:  aggressive medical therapy for CHF. Will add aldactone to Coreg, lasix, and losartan. Titrate as BP allows. High dose statin therapy. LV dysfunction is out of proportion to the degree of CAD.  ASSESSMENT AND PLAN:  1.  Coronary artery disease: Most recent cardiac catheterization May 2021 revealed diffuse coronary atherosclerosis involving the mid LAD.  He is being treated medically.  On statin therapy, beta-blocker, 81 mg aspirin, and ARB.  He denies any recurrent chest discomfort.  In fact he does not feel any better on the addition of nitrates.  He is having a little bit of dizziness.  I am going to decrease his isosorbide to 50 mg daily from 30 mg daily.  BP was 85/49.  Although optimal for reduced EF he is having some symptoms and therefore we will allow for a little bit higher blood pressure.  2.  Ischemic cardiomyopathy, with systolic CHF: Most recent echocardiogram 05/04/2020, revealed EF of 35% to 40%.  He remains on ARB and torsemide.  We will repeat his echocardiogram for evaluation of his LV status.  He will remain on carvedilol 25 mg twice daily, spironolactone, and losartan as directed.  Follow-up labs should be ordered on next visit to ascertain kidney function and potassium status.  3.  Insulin-dependent diabetes: Followed by Dr. Joya Gaskins.  He also follows his labs.  Most recent hemoglobin A1c 02/21/2021 was 8.7.  Serum creatinine 1.3.  Consider adding Jardiance if blood glucose is not well controlled in the setting of systolic CHF for optimal medical management, however with pancriatitis history, this may not be feasible.   4.  Hyperlipidemia: Continue on statin therapy.  Most recent labs on 11/15/2020 LDL 59.0 total cholesterol 111.  Triglycerides 79.  No changes in his regimen at this time.  5.  Chronic pancreatitis: Followed by Dr. Joya Gaskins.   Current medicines are reviewed at length with the patient today.  I have spent  25 minutes dedicated to the care of this patient on the date of this  encounter to include pre-visit review of records, assessment, management and diagnostic testing,with shared decision making.  Labs/ tests ordered today include: Echocardiogram.   Phill Myron. West Pugh, ANP, [facility]   03/18/2021 11:41 AM    [facility] [place] [address] Office [phone] Fax [phone]  Notice: This dictation was prepared with Dragon dictation along with smaller phrase technology. Any transcriptional errors that result from this process are unintentional and may not be corrected upon review.

## 2021-03-16 NOTE — Progress Notes (Signed)
Mr Mealey has been doing ok.   CBG 352 on his machine, 390 on ours. He had 1/2 a cookie today.  He normally takes 7 U humalog bid and Lantus 40 U qam. He had these meds today, does not know why his CBG is so high right now.   BP 118/42, HR 85, 98% O2 sats  He has some strips that do not fit his machine, they were removed from his bag.   He has not been sick. Has not had any light-headed or dizzy feeling. Is not aware of any excess urination. No ETOH  He had one reading of 60 ish the other day, first thing am. He took the Lantus and the Humalog later that day, after he ate.   Dr [name] wants him to do 9 U tonight at dinner, drink a lot of water. No other med changes, but drink extra water today.  Theodore Demark, PA-C 03/16/2021 3:08 PM

## 2021-03-17 ENCOUNTER — Other Ambulatory Visit (HOSPITAL_COMMUNITY): Payer: Self-pay

## 2021-03-18 ENCOUNTER — Encounter: Payer: Self-pay | Admitting: Adult Health

## 2021-03-18 ENCOUNTER — Other Ambulatory Visit (HOSPITAL_COMMUNITY): Payer: Self-pay

## 2021-03-18 ENCOUNTER — Ambulatory Visit (INDEPENDENT_AMBULATORY_CARE_PROVIDER_SITE_OTHER): Payer: Self-pay | Admitting: Adult Health

## 2021-03-18 ENCOUNTER — Other Ambulatory Visit: Payer: Self-pay

## 2021-03-18 VITALS — BP 85/49 | HR 62 | Ht 68.0 in | Wt 135.4 lb

## 2021-03-18 DIAGNOSIS — K861 Other chronic pancreatitis: Secondary | ICD-10-CM

## 2021-03-18 DIAGNOSIS — I10 Essential (primary) hypertension: Secondary | ICD-10-CM

## 2021-03-18 DIAGNOSIS — I2510 Atherosclerotic heart disease of native coronary artery without angina pectoris: Secondary | ICD-10-CM

## 2021-03-18 DIAGNOSIS — E785 Hyperlipidemia, unspecified: Secondary | ICD-10-CM

## 2021-03-18 DIAGNOSIS — I5042 Chronic combined systolic (congestive) and diastolic (congestive) heart failure: Secondary | ICD-10-CM

## 2021-03-18 DIAGNOSIS — E1169 Type 2 diabetes mellitus with other specified complication: Secondary | ICD-10-CM

## 2021-03-18 MED ORDER — TORSEMIDE 20 MG PO TABS: ORAL_TABLET | ORAL | 6 refills | Status: DC

## 2021-03-18 MED ORDER — CARVEDILOL 25 MG PO TABS
25.0000 mg | ORAL_TABLET | Freq: Two times a day (BID) | ORAL | 5 refills | Status: DC
  Filled 2021-03-18: qty 60, 30d supply, fill #0

## 2021-03-18 MED ORDER — LOSARTAN POTASSIUM 100 MG PO TABS
100.0000 mg | ORAL_TABLET | Freq: Every day | ORAL | 5 refills | Status: DC
  Filled 2021-03-18 – 2021-03-24 (×2): qty 30, 30d supply, fill #0

## 2021-03-18 MED ORDER — SPIRONOLACTONE 25 MG PO TABS
25.0000 mg | ORAL_TABLET | Freq: Every day | ORAL | 5 refills | Status: DC
  Filled 2021-03-18 – 2021-03-24 (×2): qty 30, 30d supply, fill #0

## 2021-03-18 MED ORDER — ISOSORBIDE MONONITRATE ER 30 MG PO TB24
15.0000 mg | ORAL_TABLET | Freq: Every day | ORAL | 3 refills | Status: DC
  Filled 2021-03-18: qty 45, 90d supply, fill #0

## 2021-03-18 MED ORDER — ATORVASTATIN CALCIUM 80 MG PO TABS
80.0000 mg | ORAL_TABLET | Freq: Every day | ORAL | 2 refills | Status: DC
  Filled 2021-03-18: qty 30, 30d supply, fill #0

## 2021-03-18 NOTE — Patient Instructions (Signed)
Medication Instructions:  Start Imdur 15 mg ( 0.5 of 30 mg Tablet Daily) *If you need a refill on your cardiac medications before your next appointment, please call your pharmacy*   Lab Work: No Labs If you have labs (blood work) drawn today and your tests are completely normal, you will receive your results only by: MyChart Message (if you have MyChart) OR A paper copy in the mail If you have any lab test that is abnormal or we need to change your treatment, we will call you to review the results.   Testing/Procedures: [address], [address] Your physician has requested that you have an echocardiogram. Echocardiography is a painless test that uses sound waves to create images of your heart. It provides your doctor with information about the size and shape of your heart and how well your heart's chambers and valves are working. This procedure takes approximately one hour. There are no restrictions for this procedure.    Follow-Up: At [facility], you and your health needs are our priority.  As part of our continuing mission to provide you with exceptional heart care, we have created designated Provider Care Teams.  These Care Teams include your primary Cardiologist (physician) and Advanced Practice Providers (APPs -  Physician Assistants and Nurse Practitioners) who all work together to provide you with the care you need, when you need it.  We recommend signing up for the patient portal called "MyChart".  Sign up information is provided on this After Visit Summary.  MyChart is used to connect with patients for Virtual Visits (Telemedicine).  Patients are able to view lab/test results, encounter notes, upcoming appointments, etc.  Non-urgent messages can be sent to your provider as well.   To learn more about what you can do with MyChart, go to [email or web address].    Your next appointment:   3 month(s)  The format for your next appointment:   In Person  Provider:    [name], MD

## 2021-03-21 ENCOUNTER — Ambulatory Visit: Payer: Medicaid Other | Attending: Critical Care Medicine | Admitting: Critical Care Medicine

## 2021-03-21 ENCOUNTER — Encounter: Payer: Self-pay | Admitting: Critical Care Medicine

## 2021-03-21 ENCOUNTER — Other Ambulatory Visit: Payer: Self-pay

## 2021-03-21 VITALS — BP 103/64 | HR 62 | Temp 98.2°F | Ht 68.0 in | Wt 137.8 lb

## 2021-03-21 DIAGNOSIS — I5042 Chronic combined systolic (congestive) and diastolic (congestive) heart failure: Secondary | ICD-10-CM | POA: Diagnosis not present

## 2021-03-21 DIAGNOSIS — E1169 Type 2 diabetes mellitus with other specified complication: Secondary | ICD-10-CM

## 2021-03-21 DIAGNOSIS — E1165 Type 2 diabetes mellitus with hyperglycemia: Secondary | ICD-10-CM

## 2021-03-21 DIAGNOSIS — I2510 Atherosclerotic heart disease of native coronary artery without angina pectoris: Secondary | ICD-10-CM

## 2021-03-21 DIAGNOSIS — I10 Essential (primary) hypertension: Secondary | ICD-10-CM | POA: Diagnosis not present

## 2021-03-21 DIAGNOSIS — E785 Hyperlipidemia, unspecified: Secondary | ICD-10-CM

## 2021-03-21 DIAGNOSIS — Z23 Encounter for immunization: Secondary | ICD-10-CM | POA: Diagnosis not present

## 2021-03-21 NOTE — Assessment & Plan Note (Addendum)
Stable today with benign exam. He has upcoming echo with cardiology next month. Plan labs today to include electrolyte panel.   Currently holding torsemide.

## 2021-03-21 NOTE — Patient Instructions (Signed)
No change in medications  Pneumonia vaccine was given  Return to see Dr. [name] in 4 months  We will continue to [facility] at the shelter

## 2021-03-21 NOTE — Assessment & Plan Note (Signed)
Stable on multiple medications. No changes made today. Continue carvedilol 25mg  BID, losartan 100mg , spironolactone 25mg .

## 2021-03-21 NOTE — Assessment & Plan Note (Signed)
Plan to continue insulin regimen.  lantus 40 unit Qam Lispro 7 U with 2 meals per day

## 2021-03-21 NOTE — Assessment & Plan Note (Signed)
Stable. Has not needed to use nitro. Denies chest pains. Has upcoming echo with cards next month.

## 2021-03-21 NOTE — Progress Notes (Signed)
Established Patient Office Visit  Subjective:  Patient ID: Rodney Barber, male    DOB: 09-14-1965  Age: 55 y.o. MRN: [id number]  CC:  Chief Complaint  Patient presents with   Follow-up    HPI Rodney Barber presents for a routine follow up.   Checking blood sugar twice day and was 160 non fasting this morning. He recently saw cardiology and was stated on isosorbide 51m but taking 1/2 tab daily. He denies any dizziness, weakness or falls. He does not have a BP log at home. [place] He has an upcoming echocardiogram next month. No other complaints at this time.   Past Medical History:  Diagnosis Date   [place] chronic pancreatitis [place] 07/22/2013   Diabetes mellitus    Hypertension    Pancreatitis     Past Surgical History:  Procedure Laterality Date   ERCP  06/27/2011   Procedure: ENDOSCOPIC RETROGRADE CHOLANGIOPANCREATOGRAPHY (ERCP);  Surgeon: MJeryl Columbia MD;  [facility];  Service: Endoscopy;  Laterality: N/A;   ERCP W/ PLASTIC STENT PLACEMENT  03/2011   IR [place] ASP/DRAIN  05/16/2019   LEFT HEART CATH AND CORONARY ANGIOGRAPHY N/A 01/02/2020   Procedure: LEFT HEART CATH AND CORONARY ANGIOGRAPHY;  Surgeon: JMartinique Peter M, MD;  [facility];  Service: Cardiovascular;  Laterality: N/A;   TEE WITHOUT CARDIOVERSION N/A 05/20/2019   Procedure: TRANSESOPHAGEAL ECHOCARDIOGRAM (TEE);  Surgeon: HPixie Casino MD;  [facility];  Service: Cardiovascular;  Laterality: N/A;    Family History  Problem Relation Age of Onset   Diabetes Mother    Diabetes Brother     Social History   Socioeconomic History   Marital status: Single    Spouse name: Not on file   Number of children: Not on file   Years of education: Not on file   Highest education level: Not on file  Occupational History   Not on file  Tobacco Use   Smoking status: Every Day    Packs/day:  0.50    Years: 30.00    Pack years: 15.00    Types: Cigarettes   Smokeless tobacco: Never   Tobacco comments:    6 cigarett /day  Vaping Use   Vaping Use: Never used  Substance and Sexual Activity   Alcohol use: No   Drug use: Not Currently    Types: "Crack" cocaine, Other-see comments, Cocaine    Comment: last smoked crack 12-16-16   Sexual activity: Never  Other Topics Concern   Not on file  Social History Narrative   Not on file   Social Determinants of Health   Financial Resource Strain: Not on file  Food Insecurity: Not on file  Transportation Needs: Not on file  Physical Activity: Not on file  Stress: Not on file  Social Connections: Not on file  Intimate Partner Violence: Not on file    Outpatient Medications Prior to Visit  Medication Sig Dispense Refill   aspirin 81 MG chewable tablet CHEW 1 TABLET (81 MG TOTAL) BY MOUTH DAILY. (Patient taking differently: Chew 81 mg by mouth daily.) 120 tablet 0   atorvastatin (LIPITOR) 80 MG tablet Take 1 tablet (80 mg total) by mouth daily at 6pm 60 tablet 2   blood glucose meter kit and supplies KIT Dispense based on patient and insurance preference. Use up to four times daily as directed. (FOR ICD-9 250.00, 250.01). 1 each 0   carvedilol (  COREG) 25 MG tablet Take 1 tablet (25 mg total) by mouth 2 (two) times daily with a meal. 60 tablet 5   feeding supplement, GLUCERNA SHAKE, (GLUCERNA SHAKE) LIQD Take 237 mLs by mouth 3 (three) times daily between meals.  0   Ferrous Sulfate Dried 200 (65 Fe) MG TABS TAKE 200 MG BY MOUTH 2 (TWO) TIMES DAILY WITH A MEAL. 60 tablet 3   Glucose 4-6 GM-MG CHEW Chew one as needed for blood glucose < 70 (Patient taking differently: Chew 1 tablet by mouth 2 (two) times daily as needed (low glucose). Chew one as needed for blood glucose < 70) 100 tablet 0   glucose blood test strip Use as directed 3 times daily to test blood sugar. 100 strip 12   ibuprofen (ADVIL) 200 MG tablet Take 400 mg by mouth  every 6 (six) hours as needed for fever, headache or mild pain.     insulin glargine (LANTUS) 100 UNIT/ML Solostar Pen Inject 40 Units into the skin daily. CONGREGATIONAL NURSE PROGRAM FUND 15 mL 11   insulin lispro (HUMALOG) 100 UNIT/ML KwikPen Inject 7 Units into the skin 2 (two) times daily before a meal. Skip your dose if you do not eat or if your sugar is <150. 6 mL 1   Insulin Pen Needle 31G X 5 MM MISC USE WITH INSULIN PENS 100 each 6   isosorbide mononitrate (IMDUR) 30 MG 24 hr tablet Take 0.5 tablets (15 mg total) by mouth daily. 45 tablet 3   losartan (COZAAR) 100 MG tablet Take 1 tablet (100 mg total) by mouth daily. 30 tablet 5   nitroGLYCERIN (NITROSTAT) 0.4 MG SL tablet Place 1 tablet (0.4 mg total) under the tongue every 5 (five) minutes as needed for chest pain. 100 tablet 3   Pancrelipase, Lip-Prot-Amyl, 6000-19000 units CPEP TAKE 2 CAPSULES (12,000 UNITS TOTAL) BY MOUTH IN THE MORNING, AT NOON, AND AT BEDTIME. 360 capsule 0   pantoprazole (PROTONIX) 40 MG tablet Take 1 tablet (40 mg total) by mouth daily. 30 tablet 5   spironolactone (ALDACTONE) 25 MG tablet Take 1 tablet (25 mg total) by mouth daily. 60 tablet 5   torsemide (DEMADEX) 20 MG tablet HOLD 30 tablet 6   triamcinolone cream (KENALOG) 0.1 % Apply 1 application topically 2 (two) times daily. 30 g 0   insulin lispro (HUMALOG) 100 UNIT/ML KwikPen Inject 5 Units into the skin 2 (two) times daily before a meal. Skip your dose if you do not eat or if your sugar is <150. (Patient not taking: Reported on 03/21/2021) 6 mL 1   clobetasol cream (TEMOVATE) 1.61 % Apply 1 application topically 2 (two) times daily. 30 g 0   Insta-Glucose 77.4 % GEL 1 Tube      No facility-administered medications prior to visit.    No Known Allergies  ROS Review of Systems  Constitutional: Negative.  Negative for chills, fatigue and fever.  HENT: Negative.    Eyes: Negative.  Negative for visual disturbance.  Respiratory: Negative.  Negative  for chest tightness and shortness of breath.   Cardiovascular: Negative.  Negative for chest pain, palpitations and leg swelling.  Gastrointestinal: Negative.  Negative for abdominal pain, constipation, diarrhea, nausea and vomiting.  Endocrine: Negative.   Genitourinary: Negative.   Musculoskeletal: Negative.   Skin: Negative.   Allergic/Immunologic: Negative.   Neurological:  Negative for dizziness, weakness and light-headedness.  Psychiatric/Behavioral: Negative.       Objective:    Physical Exam Constitutional:  General: He is not in acute distress.    Appearance: Normal appearance. He is normal weight. He is not ill-appearing.  HENT:     Head: Normocephalic.     Mouth/Throat:     Mouth: Mucous membranes are moist.     Pharynx: Oropharynx is clear.  Cardiovascular:     Rate and Rhythm: Normal rate and regular rhythm.     Pulses: Normal pulses.     Heart sounds: Normal heart sounds.  Pulmonary:     Effort: Pulmonary effort is normal.     Breath sounds: Normal breath sounds.  Abdominal:     General: Abdomen is flat. Bowel sounds are normal.     Palpations: Abdomen is soft.     Tenderness: There is no abdominal tenderness.  Musculoskeletal:     Right lower leg: No edema.     Left lower leg: No edema.  Skin:    General: Skin is warm and dry.     Findings: No erythema or rash.  Neurological:     General: No focal deficit present.     Mental Status: He is alert.  Psychiatric:        Mood and Affect: Mood normal.        Behavior: Behavior normal.    BP 103/64 (BP Location: Left Arm, Patient Position: Sitting, Cuff Size: Normal)   Pulse 62   Temp 98.2 F (36.8 C) (Oral)   Ht _0  (1.727 m)   Wt 137 lb 12.8 oz (62.5 kg)   SpO2 100%   BMI 20.95 kg/m  Wt Readings from Last 3 Encounters:  03/21/21 137 lb 12.8 oz (62.5 kg)  03/18/21 135 lb 6.4 oz (61.4 kg)  12/10/20 139 lb 6.4 oz (63.2 kg)     Health Maintenance Due  Topic Date Due   Zoster Vaccines-  Shingrix (1 of 2) Never done   COVID-19 Vaccine (3 - Mixed Product risk series) 02/27/2020   Pneumococcal Vaccine 19-56 Years old (2 - PCV) 05/14/2020   INFLUENZA VACCINE  03/07/2021    There are no preventive care reminders to display for this patient.  Lab Results  Component Value Date   TSH 2.097 10/21/2019   Lab Results  Component Value Date   WBC 14.4 (H) 11/28/2020   HGB 9.9 (L) 11/28/2020   HCT 30.9 (L) 11/28/2020   MCV 93.9 11/28/2020   PLT 204 11/28/2020   Lab Results  Component Value Date   NA 138 11/28/2020   K 3.7 11/28/2020   CO2 26 11/28/2020   GLUCOSE 199 (H) 11/28/2020   BUN 32 (H) 11/28/2020   CREATININE 1.34 (H) 11/28/2020   BILITOT 0.4 11/28/2020   ALKPHOS 68 11/28/2020   AST 14 (L) 11/28/2020   ALT 18 11/28/2020   PROT 7.4 11/28/2020   ALBUMIN 3.9 11/28/2020   CALCIUM 9.0 11/28/2020   ANIONGAP 6 11/28/2020   EGFR 54 (L) 11/15/2020   Lab Results  Component Value Date   CHOL 111 11/15/2020   Lab Results  Component Value Date   HDL 36 (L) 11/15/2020   Lab Results  Component Value Date   LDLCALC 59 11/15/2020   Lab Results  Component Value Date   TRIG 79 11/15/2020   Lab Results  Component Value Date   CHOLHDL 3.1 11/15/2020   Lab Results  Component Value Date   HGBA1C 8.7 (A) 02/21/2021      Assessment & Plan:   Problem List Items Addressed This Visit  Cardiovascular and Mediastinum   Chronic combined systolic (congestive) and diastolic (congestive) heart failure (HCC)    Stable today with benign exam. He has upcoming echo with cardiology next month. Plan labs today to include electrolyte panel.   Currently holding torsemide.       Essential hypertension    Stable on multiple medications. No changes made today. Continue carvedilol 62m BID, losartan 1051m spironolactone 2536m      Relevant Orders   CBC with Differential/Platelet   Coronary artery disease    Stable. Has not needed to use nitro. Denies chest  pains. Has upcoming echo with cards next month.         Endocrine   Hyperlipidemia associated with type 2 diabetes mellitus ([facility] Relevant Orders   Lipid panel   Uncontrolled type 2 diabetes mellitus ([facility]    Plan to continue insulin regimen.  lantus 40 unit Qam Lispro 7 U with 2 meals per day       Relevant Orders   Comprehensive metabolic panel   Other Visit Diagnoses     Need for pneumococcal vaccination       Relevant Orders   Pneumococcal conjugate vaccine 20-valent (Prevnar 20) (Completed)       No orders of the defined types were placed in this encounter.   Follow-up: Return in about 4 months (around 07/21/2021).    PatAsencion NobleD

## 2021-03-22 LAB — COMPREHENSIVE METABOLIC PANEL
ALT: 32 IU/L (ref 0–44)
AST: 28 IU/L (ref 0–40)
Albumin/Globulin Ratio: 1.9 (ref 1.2–2.2)
Albumin: 3.9 g/dL (ref 3.8–4.9)
Alkaline Phosphatase: 84 IU/L (ref 44–121)
BUN/Creatinine Ratio: 23 — ABNORMAL HIGH (ref 9–20)
BUN: 31 mg/dL — ABNORMAL HIGH (ref 6–24)
Bilirubin Total: 0.2 mg/dL (ref 0.0–1.2)
CO2: 20 mmol/L (ref 20–29)
Calcium: 9.1 mg/dL (ref 8.7–10.2)
Chloride: 112 mmol/L — ABNORMAL HIGH (ref 96–106)
Creatinine, Ser: 1.32 mg/dL — ABNORMAL HIGH (ref 0.76–1.27)
Globulin, Total: 2.1 g/dL (ref 1.5–4.5)
Glucose: 95 mg/dL (ref 65–99)
Potassium: 4.8 mmol/L (ref 3.5–5.2)
Sodium: 143 mmol/L (ref 134–144)
Total Protein: 6 g/dL (ref 6.0–8.5)
eGFR: 63 mL/min/{1.73_m2} (ref >59)

## 2021-03-22 LAB — CBC WITH DIFFERENTIAL/PLATELET
Basophils Absolute: 0.1 10*3/uL (ref 0.0–0.2)
Basos: 1 %
EOS (ABSOLUTE): 0.2 10*3/uL (ref 0.0–0.4)
Eos: 2 %
Hematocrit: 27.3 % — ABNORMAL LOW (ref 37.5–51.0)
Hemoglobin: 8.9 g/dL — ABNORMAL LOW (ref 13.0–17.7)
Immature Grans (Abs): 0 10*3/uL (ref 0.0–0.1)
Immature Granulocytes: 0 %
Lymphocytes Absolute: 1.6 10*3/uL (ref 0.7–3.1)
Lymphs: 18 %
MCH: 30.4 pg (ref 26.6–33.0)
MCHC: 32.6 g/dL (ref 31.5–35.7)
MCV: 93 fL (ref 79–97)
Monocytes Absolute: 0.6 10*3/uL (ref 0.1–0.9)
Monocytes: 6 %
Neutrophils Absolute: 6.4 10*3/uL (ref 1.4–7.0)
Neutrophils: 73 %
Platelets: 203 10*3/uL (ref 150–450)
RBC: 2.93 x10E6/uL — ABNORMAL LOW (ref 4.14–5.80)
RDW: 13.2 % (ref 11.6–15.4)
WBC: 8.8 10*3/uL (ref 3.4–10.8)

## 2021-03-22 LAB — LIPID PANEL
Chol/HDL Ratio: 3 ratio (ref 0.0–5.0)
Cholesterol, Total: 93 mg/dL — ABNORMAL LOW (ref 100–199)
HDL: 31 mg/dL — ABNORMAL LOW (ref >39)
LDL Chol Calc (NIH): 44 mg/dL (ref 0–99)
Triglycerides: 89 mg/dL (ref 0–149)
VLDL Cholesterol Cal: 18 mg/dL (ref 5–40)

## 2021-03-23 ENCOUNTER — Encounter: Payer: Self-pay | Admitting: Family Medicine

## 2021-03-23 NOTE — Progress Notes (Signed)
Seen in  [facility] 55 yo with HF, HTN, CAD, T2DM  No concerns today, doing well  Meds reviewed  Vitals:   03/23/21 2021  BP: (!) 130/50  Pulse: 70  SpO2: 98%   NAD RRR No LEE  AP T2DM - BG today 112, doing well - he's out of his humalog - discussed with Bonnita Nasuti, Congregational Nurse, surprising that he's run out.  Will let her review this with him tomorrow morning and then refill as needed.     Current Outpatient Medications:    aspirin 81 MG chewable tablet, CHEW 1 TABLET (81 MG TOTAL) BY MOUTH DAILY. (Patient taking differently: Chew 81 mg by mouth daily.), Disp: 120 tablet, Rfl: 0   atorvastatin (LIPITOR) 80 MG tablet, Take 1 tablet (80 mg total) by mouth daily at 6pm, Disp: 60 tablet, Rfl: 2   blood glucose meter kit and supplies KIT, Dispense based on patient and insurance preference. Use up to four times daily as directed. (FOR ICD-9 250.00, 250.01)., Disp: 1 each, Rfl: 0   carvedilol (COREG) 25 MG tablet, Take 1 tablet (25 mg total) by mouth 2 (two) times daily with Rosealie Reach meal., Disp: 60 tablet, Rfl: 5   feeding supplement, GLUCERNA SHAKE, (GLUCERNA SHAKE) LIQD, Take 237 mLs by mouth 3 (three) times daily between meals., Disp: , Rfl: 0   Ferrous Sulfate Dried 200 (65 Fe) MG TABS, TAKE 200 MG BY MOUTH 2 (TWO) TIMES DAILY WITH Rastus Borton MEAL., Disp: 60 tablet, Rfl: 3   Glucose 4-6 GM-MG CHEW, Chew one as needed for blood glucose < 70 (Patient taking differently: Chew 1 tablet by mouth 2 (two) times daily as needed (low glucose). Chew one as needed for blood glucose < 70), Disp: 100 tablet, Rfl: 0   glucose blood test strip, Use as directed 3 times daily to test blood sugar., Disp: 100 strip, Rfl: 12   ibuprofen (ADVIL) 200 MG tablet, Take 400 mg by mouth every 6 (six) hours as needed for fever, headache or mild pain., Disp: , Rfl:    insulin glargine (LANTUS) 100 UNIT/ML Solostar Pen, Inject 40 Units into the skin daily. CONGREGATIONAL NURSE PROGRAM FUND, Disp: 15 mL, Rfl: 11   insulin  lispro (HUMALOG) 100 UNIT/ML KwikPen, Inject 5 Units into the skin 2 (two) times daily before Myleka Moncure meal. Skip your dose if you do not eat or if your sugar is <150. (Patient not taking: Reported on 03/21/2021), Disp: 6 mL, Rfl: 1   insulin lispro (HUMALOG) 100 UNIT/ML KwikPen, Inject 7 Units into the skin 2 (two) times daily before Jared Cahn meal. Skip your dose if you do not eat or if your sugar is <150., Disp: 6 mL, Rfl: 1   Insulin Pen Needle 31G X 5 MM MISC, USE WITH INSULIN PENS, Disp: 100 each, Rfl: 6   isosorbide mononitrate (IMDUR) 30 MG 24 hr tablet, Take 0.5 tablets (15 mg total) by mouth daily., Disp: 45 tablet, Rfl: 3   losartan (COZAAR) 100 MG tablet, Take 1 tablet (100 mg total) by mouth daily., Disp: 30 tablet, Rfl: 5   nitroGLYCERIN (NITROSTAT) 0.4 MG SL tablet, Place 1 tablet (0.4 mg total) under the tongue every 5 (five) minutes as needed for chest pain., Disp: 100 tablet, Rfl: 3   Pancrelipase, Lip-Prot-Amyl, 6000-19000 units CPEP, TAKE 2 CAPSULES (12,000 UNITS TOTAL) BY MOUTH IN THE MORNING, AT NOON, AND AT BEDTIME., Disp: 360 capsule, Rfl: 0   pantoprazole (PROTONIX) 40 MG tablet, Take 1 tablet (40 mg total) by mouth daily.,  Disp: 30 tablet, Rfl: 5   spironolactone (ALDACTONE) 25 MG tablet, Take 1 tablet (25 mg total) by mouth daily., Disp: 60 tablet, Rfl: 5   torsemide (DEMADEX) 20 MG tablet, HOLD, Disp: 30 tablet, Rfl: 6   triamcinolone cream (KENALOG) 0.1 %, Apply 1 application topically 2 (two) times daily., Disp: 30 g, Rfl: 0

## 2021-03-23 NOTE — Addendum Note (Signed)
Addended by: Dorris Fetch on: 03/23/2021 09:47 AM   Modules accepted: Orders

## 2021-03-24 ENCOUNTER — Other Ambulatory Visit (HOSPITAL_COMMUNITY): Payer: Self-pay

## 2021-03-30 ENCOUNTER — Encounter: Payer: Self-pay | Admitting: Physician Assistant

## 2021-03-30 ENCOUNTER — Other Ambulatory Visit (HOSPITAL_COMMUNITY): Payer: Self-pay

## 2021-03-30 ENCOUNTER — Other Ambulatory Visit: Payer: Self-pay | Admitting: Critical Care Medicine

## 2021-03-30 MED ORDER — TRIAMCINOLONE ACETONIDE 0.1 % EX CREA
1.0000 "application " | TOPICAL_CREAM | Freq: Two times a day (BID) | CUTANEOUS | 0 refills | Status: DC
  Filled 2021-03-30: qty 30, 15d supply, fill #0

## 2021-03-30 MED ORDER — ISOSORBIDE MONONITRATE ER 30 MG PO TB24: 30.0000 mg | ORAL_TABLET | Freq: Every day | ORAL | 3 refills | Status: DC

## 2021-03-30 NOTE — Progress Notes (Addendum)
Pt seen by Dr [name].  CBGs as low as 84, am reading today.  He had 1 CBG reading as high as 416, but all other readings are generally 80s-110s, occasionally has one higher.   He is mostly compliant w/ meds.  Says his gallstones are bothering him, but he points to L flank, so that is likely 2nd to his kidney stones.  The process of using a lawyer to get qualified for disability was explained. He is interested.   When he saw K. Lawrence DNP, BP 85/49 and Imdur decreased from 30 mg qd to 15 mg qd.  Today, BP 160/64, hr 66, O2 sats 96%   He is out of steroid cream. That helps his rash. Rx sent in.  He thinks his stamina may be a little better on the meds.   Theodore Demark, PA-C 03/30/2021 3:24 PM

## 2021-03-31 ENCOUNTER — Other Ambulatory Visit (HOSPITAL_COMMUNITY): Payer: Self-pay

## 2021-03-31 ENCOUNTER — Other Ambulatory Visit: Payer: Self-pay | Admitting: *Deleted

## 2021-03-31 MED ORDER — ATORVASTATIN CALCIUM 80 MG PO TABS
80.0000 mg | ORAL_TABLET | Freq: Every day | ORAL | 0 refills | Status: DC
  Filled 2021-03-31: qty 30, 30d supply, fill #0

## 2021-04-05 ENCOUNTER — Encounter: Payer: Self-pay | Admitting: Physician Assistant

## 2021-04-06 ENCOUNTER — Telehealth: Payer: Self-pay | Admitting: Critical Care Medicine

## 2021-04-06 NOTE — Telephone Encounter (Signed)
Copied from CRM [id number]. Topic: General - Inquiry >> Apr 04, 2021  1:48 PM [name] wrote: Pt called asking to speak to Dr. [name].  He said it is an appt with a lawyer that he has coming up.  He could not give much information to me  CB#  [phone] >> Apr 05, 2021  9:48 AM Marylen Ponto wrote: Pt called once again to request that Dr. [name] return his call. Cb# [phone]

## 2021-04-06 NOTE — Telephone Encounter (Signed)
Pt is requesting a  call 

## 2021-04-07 ENCOUNTER — Encounter: Payer: Self-pay | Admitting: Physician Assistant

## 2021-04-07 NOTE — Progress Notes (Signed)
Rec'd a call from [place] at [employer] has been sent, that should take about 2 weeks.  She will let me know if a physical therapy evaluation is crucial, but she will not get an answer on that until next week.  Theodore Demark, PA-C 04/07/2021 4:55 PM

## 2021-04-07 NOTE — Telephone Encounter (Signed)
I spoke to the pt , his ?s have been answerede

## 2021-04-12 ENCOUNTER — Other Ambulatory Visit: Payer: Self-pay

## 2021-04-12 ENCOUNTER — Ambulatory Visit (HOSPITAL_COMMUNITY): Payer: Medicaid Other | Attending: Internal Medicine

## 2021-04-12 DIAGNOSIS — I5042 Chronic combined systolic (congestive) and diastolic (congestive) heart failure: Secondary | ICD-10-CM | POA: Insufficient documentation

## 2021-04-12 LAB — ECHOCARDIOGRAM COMPLETE
Area-P 1/2: 3.31 cm2
S' Lateral: 3.4 cm

## 2021-04-13 ENCOUNTER — Other Ambulatory Visit (HOSPITAL_COMMUNITY): Payer: Self-pay

## 2021-04-13 ENCOUNTER — Other Ambulatory Visit: Payer: Self-pay | Admitting: *Deleted

## 2021-04-13 ENCOUNTER — Other Ambulatory Visit: Payer: Self-pay | Admitting: Critical Care Medicine

## 2021-04-13 ENCOUNTER — Encounter: Payer: Self-pay | Admitting: Critical Care Medicine

## 2021-04-13 MED ORDER — ISOSORBIDE MONONITRATE ER 30 MG PO TB24
30.0000 mg | ORAL_TABLET | Freq: Every day | ORAL | 3 refills | Status: DC
  Filled 2021-04-13: qty 30, 30d supply, fill #0
  Filled 2021-05-12: qty 30, 30d supply, fill #1

## 2021-04-13 MED ORDER — PANTOPRAZOLE SODIUM 40 MG PO TBEC
40.0000 mg | DELAYED_RELEASE_TABLET | Freq: Every day | ORAL | 5 refills | Status: DC
Start: 2021-04-13 — End: 2021-04-20
  Filled 2021-04-13: qty 30, 30d supply, fill #0

## 2021-04-13 MED ORDER — INSULIN LISPRO (1 UNIT DIAL) 100 UNIT/ML (KWIKPEN): 7.0000 [IU] | PEN_INJECTOR | Freq: Two times a day (BID) | SUBCUTANEOUS | 1 refills | Status: DC

## 2021-04-13 MED ORDER — LOSARTAN POTASSIUM 100 MG PO TABS
100.0000 mg | ORAL_TABLET | Freq: Every day | ORAL | 5 refills | Status: DC
  Filled 2021-04-13 – 2021-05-12 (×2): qty 30, 30d supply, fill #0

## 2021-04-13 MED ORDER — CARVEDILOL 25 MG PO TABS
25.0000 mg | ORAL_TABLET | Freq: Two times a day (BID) | ORAL | 5 refills | Status: DC
  Filled 2021-04-13: qty 60, 30d supply, fill #0
  Filled 2021-05-12: qty 60, 30d supply, fill #1

## 2021-04-13 MED ORDER — PANCRELIPASE (LIP-PROT-AMYL) 6000-19000 UNITS PO CPEP
2.0000 | ORAL_CAPSULE | Freq: Three times a day (TID) | ORAL | 0 refills | Status: DC
  Filled 2021-04-13: qty 180, 30d supply, fill #0

## 2021-04-14 ENCOUNTER — Telehealth: Payer: Self-pay

## 2021-04-14 ENCOUNTER — Other Ambulatory Visit (HOSPITAL_COMMUNITY): Payer: Self-pay

## 2021-04-14 NOTE — Telephone Encounter (Addendum)
Spoke with patient regarding results of Echocardiogram.----- Message from Jodelle Gross, NP sent at 04/13/2021  6:55 PM EDT ----- Significant improvement in his heart pumping function from 35% to normal. This is very good news. We will continue his current regimen.    Samara Deist

## 2021-04-14 NOTE — Progress Notes (Signed)
Patient ID: Rodney Barber, male   DOB: 1966-06-29, 55 y.o.   MRN: [id number] This is a 55 year old male history of coronary disease type 2 diabetes comes to the [place] [facility] for a check-in visit he has been doing well overall he still smoking about a pack of cigarettes every 2 to 3 days he has been compliant with his insulin program and his oral diabetic medications his pill organizer was dropped and some of the medicines were jumbled and so nursing reorganize the medications while I examined the patient.  On exam blood pressure 130/60 pulse 66 saturation 98% room air  Echocardiogram recently performed is dramatically better EF now is 60 to 65% up from 35 to 40% previously seen a year ago  Patient encouraged to keep his upcoming cardiology appointments and he does have an appointment with me in a very few weeks  We refilled multiple medications at this visit  Patient also has been assisted by a disability attorney connected to the patient by our PA Barrett  We are hoping he will get SSDI so that he can then proceed out of the shelter into assisted living housing

## 2021-04-20 ENCOUNTER — Other Ambulatory Visit: Payer: Self-pay | Admitting: *Deleted

## 2021-04-20 ENCOUNTER — Other Ambulatory Visit (HOSPITAL_COMMUNITY): Payer: Self-pay

## 2021-04-20 ENCOUNTER — Encounter: Payer: Self-pay | Admitting: Physician Assistant

## 2021-04-20 MED ORDER — FERROUS SULFATE 325 (65 FE) MG PO TABS
325.0000 mg | ORAL_TABLET | Freq: Every day | ORAL | 3 refills | Status: DC
  Filled 2021-04-20: qty 60, fill #0
  Filled 2021-04-21: qty 100, 50d supply, fill #0

## 2021-04-20 MED ORDER — PANTOPRAZOLE SODIUM 40 MG PO TBEC
40.0000 mg | DELAYED_RELEASE_TABLET | Freq: Every day | ORAL | 5 refills | Status: DC
  Filled 2021-04-20: qty 30, 30d supply, fill #0

## 2021-04-20 NOTE — Progress Notes (Signed)
Pt comes in to get meds refilled.  He c/o vision problems, acute on chronic.  No eye exam in a long time. L eye worse than R eye, no visual field defects reported.  CBG 153 this am, 130, 160, 150s upon meter review.   He is keeping up with his insulin.   He has not missed any Humalog before meals.   Got a new phone,  [phone]  02/21/2019 Hospice, Consult   No response from email sent asking for pro bono P.T. eval.  C/o some itching still, was given 1% hydrocortisone cream.   Some pain issues, given Tylenol.

## 2021-04-21 ENCOUNTER — Other Ambulatory Visit (HOSPITAL_COMMUNITY): Payer: Self-pay

## 2021-04-27 ENCOUNTER — Encounter: Payer: Self-pay | Admitting: Physician Assistant

## 2021-04-27 ENCOUNTER — Other Ambulatory Visit: Payer: Self-pay | Admitting: Critical Care Medicine

## 2021-04-27 ENCOUNTER — Other Ambulatory Visit (HOSPITAL_COMMUNITY): Payer: Self-pay

## 2021-04-27 MED ORDER — SPIRONOLACTONE 25 MG PO TABS
25.0000 mg | ORAL_TABLET | Freq: Every day | ORAL | 5 refills | Status: DC
  Filled 2021-04-27: qty 30, 30d supply, fill #0

## 2021-04-27 NOTE — Progress Notes (Signed)
Med box refilled, he will need refills on the iron tabs and the spironolactone.   CBG 73 this am.  He took the Lantus this am and took the Humalog after lunch when sugar was better. Sugar currently 209  No CP or SOB  He stays cold a lot.  Was anemic on last labs.   Lab Results  Component Value Date   WBC 8.8 03/21/2021   HGB 8.9 (L) 03/21/2021   HCT 27.3 (L) 03/21/2021   MCV 93 03/21/2021   PLT 203 03/21/2021   Lab Results  Component Value Date   IRON 42 (L) 10/25/2019   TIBC 186 (L) 10/25/2019   FERRITIN 312 12/29/2019   He saw the eye doctor, was told he has cataracts. He will get glasses sometime this month. The cataracts are what is making his vision bad.   145/68, hr 65, 98% O2  Theodore Demark, PA-C 04/27/2021 4:01 PM

## 2021-04-29 ENCOUNTER — Other Ambulatory Visit (HOSPITAL_COMMUNITY): Payer: Self-pay

## 2021-05-02 ENCOUNTER — Other Ambulatory Visit (HOSPITAL_COMMUNITY): Payer: Self-pay

## 2021-05-04 ENCOUNTER — Other Ambulatory Visit: Payer: Self-pay | Admitting: Physician Assistant

## 2021-05-04 ENCOUNTER — Other Ambulatory Visit (HOSPITAL_COMMUNITY): Payer: Self-pay

## 2021-05-04 ENCOUNTER — Encounter: Payer: Self-pay | Admitting: Physician Assistant

## 2021-05-04 MED ORDER — INSULIN PEN NEEDLE 31G X 5 MM MISC
6 refills | Status: DC
  Filled 2021-05-04: qty 100, 30d supply, fill #0

## 2021-05-04 MED ORDER — ATORVASTATIN CALCIUM 80 MG PO TABS
80.0000 mg | ORAL_TABLET | Freq: Every day | ORAL | 0 refills | Status: DC
  Filled 2021-05-04: qty 30, 30d supply, fill #0

## 2021-05-04 MED ORDER — GLUCOSE BLOOD VI STRP: ORAL_STRIP | 12 refills | Status: DC

## 2021-05-04 MED ORDER — INSULIN GLARGINE 100 UNIT/ML SOLOSTAR PEN
40.0000 [IU] | PEN_INJECTOR | Freq: Every day | SUBCUTANEOUS | 11 refills | Status: DC
  Filled 2021-05-04: qty 12, 30d supply, fill #0

## 2021-05-04 MED ORDER — INSULIN LISPRO (1 UNIT DIAL) 100 UNIT/ML (KWIKPEN)
7.0000 [IU] | PEN_INJECTOR | Freq: Two times a day (BID) | SUBCUTANEOUS | 1 refills | Status: DC
  Filled 2021-05-04: qty 3, 21d supply, fill #0

## 2021-05-04 NOTE — Progress Notes (Addendum)
Rodney Barber has still not gotten his glasses, he has had an exam and ordered the glasses the same day.   His med box was refilled, he is now out of Lipitor, refill sent. Other oral meds ok.  He uses 7 U Humalog bid needs a refill, sent in.  He uses 40 U of the Lantus each day, getting low >> refilled  He needs pen tips for the Lantus  He is out of True Metrix test strips, has not checked CBGs because of this. They have those at the pharmacy.  CBG today was 257. He waited and took his Lantus after lunch since he did not know his sugar. He was encouraged to take his Humalog with supper.   BP 120/60, hr 68,  O2 sats 97%  No other issues or concerns.  Rodney Demark, PA-C 05/04/2021 2:35 PM  Seen and examined with Rodney Barber.  Agree with documentation as noted above.  Meds refilled (lantus, humalog, atorvastatin) and pen needles and test strips sent.    Rodney Barber

## 2021-05-05 ENCOUNTER — Other Ambulatory Visit (HOSPITAL_COMMUNITY): Payer: Self-pay

## 2021-05-11 ENCOUNTER — Encounter: Payer: Self-pay | Admitting: Physician Assistant

## 2021-05-11 NOTE — Progress Notes (Signed)
Rodney Barber is here to get med box refilled.   He was able to get his glasses, his brother took him down there. The manager was not nice, but the lady that helped him was very nice.   His blood sugar was 134 today. He has been taking his insulin as directed.   His meds were refilled.   He is walking some better.   [name] will try to arrange PT eval and Neuro eval.   Forwarded her the email that went to [facility] professors about pro bono PT eval.  Imdur, Coreg, and Losartan refilled.   No other issues or concerns.   Theodore Demark, PA-C 05/11/2021 4:03 PM

## 2021-05-12 ENCOUNTER — Other Ambulatory Visit (HOSPITAL_COMMUNITY): Payer: Self-pay

## 2021-05-18 ENCOUNTER — Other Ambulatory Visit: Payer: Self-pay | Admitting: Physician Assistant

## 2021-05-18 ENCOUNTER — Encounter: Payer: Self-pay | Admitting: Physician Assistant

## 2021-05-18 DIAGNOSIS — E119 Type 2 diabetes mellitus without complications: Secondary | ICD-10-CM

## 2021-05-18 DIAGNOSIS — D500 Iron deficiency anemia secondary to blood loss (chronic): Secondary | ICD-10-CM

## 2021-05-18 NOTE — Progress Notes (Signed)
C/o bilateral knee discomfort, they feel cold. He had this problem last year.  He does not know why he is anemic. Has been on iron a long time. His H&H were trending down when last checked.   No tarry stools, no BRBPR, no hematuria seen.  Needs ibuprofen. Given 100 of the 200 mg tabs.  Will ck CBC, BMET, iron profile, tomorrow, then decide if further eval needed.  Lab Results  Component Value Date   WBC 8.8 03/21/2021   HGB 8.9 (L) 03/21/2021   HCT 27.3 (L) 03/21/2021   MCV 93 03/21/2021   PLT 203 03/21/2021   Will try to get him a lap blanket.   Theodore Demark, PA-C 05/18/2021 4:53 PM

## 2021-05-19 ENCOUNTER — Emergency Department (HOSPITAL_COMMUNITY)
Admission: EM | Admit: 2021-05-19 | Discharge: 2021-05-19 | Disposition: A | Discharge status: Home / Self Care | Payer: Medicaid Other | Attending: Emergency Medicine | Admitting: Emergency Medicine

## 2021-05-19 ENCOUNTER — Encounter (HOSPITAL_COMMUNITY): Payer: Self-pay | Admitting: Emergency Medicine

## 2021-05-19 DIAGNOSIS — Z7982 Long term (current) use of aspirin: Secondary | ICD-10-CM | POA: Diagnosis not present

## 2021-05-19 DIAGNOSIS — I5042 Chronic combined systolic (congestive) and diastolic (congestive) heart failure: Secondary | ICD-10-CM | POA: Insufficient documentation

## 2021-05-19 DIAGNOSIS — I2510 Atherosclerotic heart disease of native coronary artery without angina pectoris: Secondary | ICD-10-CM | POA: Diagnosis not present

## 2021-05-19 DIAGNOSIS — Z794 Long term (current) use of insulin: Secondary | ICD-10-CM | POA: Diagnosis not present

## 2021-05-19 DIAGNOSIS — Z79899 Other long term (current) drug therapy: Secondary | ICD-10-CM | POA: Insufficient documentation

## 2021-05-19 DIAGNOSIS — I110 Hypertensive heart disease with heart failure: Secondary | ICD-10-CM | POA: Insufficient documentation

## 2021-05-19 DIAGNOSIS — E162 Hypoglycemia, unspecified: Secondary | ICD-10-CM

## 2021-05-19 DIAGNOSIS — F17210 Nicotine dependence, cigarettes, uncomplicated: Secondary | ICD-10-CM | POA: Diagnosis not present

## 2021-05-19 DIAGNOSIS — D72829 Elevated white blood cell count, unspecified: Secondary | ICD-10-CM | POA: Diagnosis not present

## 2021-05-19 DIAGNOSIS — E11649 Type 2 diabetes mellitus with hypoglycemia without coma: Secondary | ICD-10-CM | POA: Insufficient documentation

## 2021-05-19 LAB — CBC WITH DIFFERENTIAL/PLATELET
Abs Immature Granulocytes: 0.05 10*3/uL (ref 0.00–0.07)
Basophils Absolute: 0.1 10*3/uL (ref 0.0–0.1)
Basophils Relative: 1 %
Eosinophils Absolute: 0.3 10*3/uL (ref 0.0–0.5)
Eosinophils Relative: 2 %
HCT: 29.1 % — ABNORMAL LOW (ref 39.0–52.0)
Hemoglobin: 9.2 g/dL — ABNORMAL LOW (ref 13.0–17.0)
Immature Granulocytes: 0 %
Lymphocytes Relative: 13 %
Lymphs Abs: 1.5 10*3/uL (ref 0.7–4.0)
MCH: 31.2 pg (ref 26.0–34.0)
MCHC: 31.6 g/dL (ref 30.0–36.0)
MCV: 98.6 fL (ref 80.0–100.0)
Monocytes Absolute: 0.7 10*3/uL (ref 0.1–1.0)
Monocytes Relative: 6 %
Neutro Abs: 9.2 10*3/uL — ABNORMAL HIGH (ref 1.7–7.7)
Neutrophils Relative %: 78 %
Platelets: 192 10*3/uL (ref 150–400)
RBC: 2.95 MIL/uL — ABNORMAL LOW (ref 4.22–5.81)
RDW: 13.3 % (ref 11.5–15.5)
WBC: 11.8 10*3/uL — ABNORMAL HIGH (ref 4.0–10.5)
nRBC: 0 % (ref 0.0–0.2)

## 2021-05-19 LAB — BASIC METABOLIC PANEL
Anion gap: 7 (ref 5–15)
BUN: 27 mg/dL — ABNORMAL HIGH (ref 6–20)
CO2: 21 mmol/L — ABNORMAL LOW (ref 22–32)
Calcium: 8.6 mg/dL — ABNORMAL LOW (ref 8.9–10.3)
Chloride: 110 mmol/L (ref 98–111)
Creatinine, Ser: 1.3 mg/dL — ABNORMAL HIGH (ref 0.61–1.24)
GFR, Estimated: >60 mL/min (ref >60)
Glucose, Bld: 80 mg/dL (ref 70–99)
Potassium: 3.9 mmol/L (ref 3.5–5.1)
Sodium: 138 mmol/L (ref 135–145)

## 2021-05-19 LAB — URINALYSIS, ROUTINE W REFLEX MICROSCOPIC
Bacteria, UA: NONE SEEN
Bilirubin Urine: NEGATIVE
Glucose, UA: 50 mg/dL — AB
Ketones, ur: NEGATIVE mg/dL
Leukocytes,Ua: NEGATIVE
Nitrite: NEGATIVE
Protein, ur: 100 mg/dL — AB
Specific Gravity, Urine: 1.014 (ref 1.005–1.030)
pH: 5 (ref 5.0–8.0)

## 2021-05-19 LAB — CBG MONITORING, ED
Glucose-Capillary: 91 mg/dL (ref 70–99)
Glucose-Capillary: 207 mg/dL — ABNORMAL HIGH (ref 70–99)
Glucose-Capillary: 290 mg/dL — ABNORMAL HIGH (ref 70–99)
Glucose-Capillary: 251 mg/dL — ABNORMAL HIGH (ref 70–99)

## 2021-05-19 MED ORDER — INSULIN GLARGINE 100 UNIT/ML SOLOSTAR PEN
32.0000 [IU] | PEN_INJECTOR | Freq: Every day | SUBCUTANEOUS | 11 refills | Status: DC
  Filled 2021-05-19: qty 9, 28d supply, fill #0

## 2021-05-19 NOTE — ED Provider Notes (Signed)
[facility] DEPT Provider Note   CSN: [id number] Arrival date & time: 05/19/21  1830     History Chief Complaint  Patient presents with   Hypoglycemia    Rodney Barber is a 55 y.o. male. With past medical history of hypertension, chronic pancreatitis, and insulin-dependent diabetes who presents to the emergency department with hypoglycemia.   States he was walking to the gas station today to get soda for his lunch when he began to feel "off." States he was trying to talk to the staff at the gas station and "sounded like Shelbie Ammons." Staff at the gas station is familiar with patient and noticed he was not acting right so called 911. EMS reports initial BG 41. He was given 26m glucagon and half of a Coca-Cola and BG only improved to 80. BG 93 on arrival.  Patient given sandwich and juice in triage.   He states this has happened before and he sees Dr. WJoya Gaskinswho manages his insulin. He denies any recent illnesses, fever, abdominal pain, polyuria or polydipsia. Denies nausea or vomiting.   Hypoglycemia Associated symptoms: no vomiting       Past Medical History:  Diagnosis Date   Alcohol-induced chronic pancreatitis ([facility] 07/22/2013   Diabetes mellitus    Hypertension    Pancreatitis     Patient Active Problem List   Diagnosis Date Noted   Coronary artery disease 04/20/2020   S/P thoracentesis    Chronic combined systolic (congestive) and diastolic (congestive) heart failure ([facility]    Essential hypertension    Tobacco abuse 05/14/2019   Substance use disorder 02/18/2019   Uncontrolled type 2 diabetes mellitus 03/15/2018   Hyperlipidemia associated with type 2 diabetes mellitus ([place] 09/08/2013   Severe malnutrition ([facility] 07/11/2013   Protein-calorie malnutrition, severe ([facility] 07/11/2013    Past Surgical History:  Procedure Laterality Date   ERCP  06/27/2011   Procedure: ENDOSCOPIC RETROGRADE CHOLANGIOPANCREATOGRAPHY (ERCP);   Surgeon: MJeryl Columbia MD;  [facility];  Service: Endoscopy;  Laterality: N/A;   ERCP W/ PLASTIC STENT PLACEMENT  03/2011   IR [place] ASP/DRAIN  05/16/2019   LEFT HEART CATH AND CORONARY ANGIOGRAPHY N/A 01/02/2020   Procedure: LEFT HEART CATH AND CORONARY ANGIOGRAPHY;  Surgeon: JMartinique Peter M, MD;  [facility];  Service: Cardiovascular;  Laterality: N/A;   TEE WITHOUT CARDIOVERSION N/A 05/20/2019   Procedure: TRANSESOPHAGEAL ECHOCARDIOGRAM (TEE);  Surgeon: HPixie Casino MD;  [facility];  Service: Cardiovascular;  Laterality: N/A;       Family History  Problem Relation Age of Onset   Diabetes Mother    Diabetes Brother     Social History   Tobacco Use   Smoking status: Every Day    Packs/day: 0.50    Years: 30.00    Pack years: 15.00    Types: Cigarettes   Smokeless tobacco: Never   Tobacco comments:    6 cigarett /day  Vaping Use   Vaping Use: Never used  Substance Use Topics   Alcohol use: No   Drug use: Not Currently    Types: "Crack" cocaine, Other-see comments, Cocaine    Comment: last smoked crack 12-16-16    Home Medications Prior to Admission medications   Medication Sig Start Date End Date Taking? Authorizing Provider  aspirin 81 MG chewable tablet CHEW 1 TABLET (81 MG TOTAL) BY MOUTH DAILY. Patient taking differently: Chew 81 mg by mouth daily. 11/25/20 11/25/21  WElsie Stain MD  atorvastatin (LIPITOR) 80 MG tablet Take 1 tablet (80 mg total) by mouth daily at 6pm 05/04/21   Barrett, Evelene Croon, PA-C  blood glucose meter kit and supplies KIT Dispense based on patient and insurance preference. Use up to four times daily as directed. (FOR ICD-9 250.00, 250.01). 11/15/19   Little Ishikawa, MD  carvedilol (COREG) 25 MG tablet Take 1 tablet (25 mg total) by mouth 2 (two) times daily with a meal. 04/13/21   Elsie Stain, MD  feeding supplement, GLUCERNA SHAKE, (GLUCERNA SHAKE) LIQD Take 237 mLs by mouth 3 (three)  times daily between meals. 09/11/19   Mariel Aloe, MD  ferrous sulfate (FEROSUL) 325 (65 FE) MG tablet Take 1 tablet (325 mg total) by mouth 2 times daily with a meal. 04/20/21   Elsie Stain, MD  Glucose 4-6 GM-MG CHEW Chew one as needed for blood glucose < 70 Patient taking differently: Chew 1 tablet by mouth 2 (two) times daily as needed (low glucose). Chew one as needed for blood glucose < 70 05/06/20   Elsie Stain, MD  glucose blood test strip Use as directed 3 times daily to test blood sugar. 02/02/21   Elsie Stain, MD  glucose blood test strip Use as directed 3 times daily to test blood sugar. 05/04/21   Barrett, Evelene Croon, PA-C  ibuprofen (ADVIL) 200 MG tablet Take 400 mg by mouth every 6 (six) hours as needed for fever, headache or mild pain.    [provider]  insulin glargine (LANTUS) 100 UNIT/ML Solostar Pen Inject 40 Units into the skin daily. 05/04/21   Barrett, Evelene Croon, PA-C  insulin lispro (HUMALOG) 100 UNIT/ML KwikPen Inject 7 Units into the skin 2 (two) times daily before a meal. Skip your dose if you do not eat or if your sugar is <150. 05/04/21   Barrett, Evelene Croon, PA-C  Insulin Pen Needle 31G X 5 MM MISC USE WITH INSULIN PENS 05/04/21 05/04/22  Barrett, Evelene Croon, PA-C  isosorbide mononitrate (IMDUR) 30 MG 24 hr tablet Take 1 tablet (30 mg total) by mouth daily. 04/13/21   Elsie Stain, MD  losartan (COZAAR) 100 MG tablet Take 1 tablet (100 mg total) by mouth daily. 04/13/21   Elsie Stain, MD  nitroGLYCERIN (NITROSTAT) 0.4 MG SL tablet Place 1 tablet (0.4 mg total) under the tongue every 5 (five) minutes as needed for chest pain. 12/15/20   Elsie Stain, MD  Pancrelipase, Lip-Prot-Amyl, 6000-19000 units CPEP Take 2 capsules (12,000 Units total) by mouth in the morning, at noon, and at bedtime. 04/13/21   Elsie Stain, MD  pantoprazole (PROTONIX) 40 MG tablet Take 1 tablet (40 mg total) by mouth daily. 04/20/21   Elsie Stain, MD   spironolactone (ALDACTONE) 25 MG tablet Take 1 tablet (25 mg total) by mouth daily. 04/27/21   Elsie Stain, MD  triamcinolone cream (KENALOG) 0.1 % Apply 1 application topically 2 (two) times daily. 03/30/21   Elsie Stain, MD  Ferrous Sulfate Dried 200 (65 Fe) MG TABS TAKE 200 MG BY MOUTH 2 (TWO) TIMES DAILY WITH A MEAL. 02/02/21 04/20/21  Elsie Stain, MD    Allergies    Patient has no known allergies.  Review of Systems   Review of Systems  Constitutional:  Negative for appetite change and fever.  Gastrointestinal:  Negative for abdominal pain, nausea and vomiting.  Endocrine: Negative for polydipsia, polyphagia and polyuria.  Neurological:  Positive for light-headedness.  Negative for syncope.  Psychiatric/Behavioral:  Positive for confusion.   All other systems reviewed and are negative.  Physical Exam Updated Vital Signs BP (!) 163/70 (BP Location: Right Arm) Comment: Simultaneous filing. User may not have seen previous data.  Pulse 96 Comment: Simultaneous filing. User may not have seen previous data.  Temp 97.9 F (36.6 C) (Oral) Comment: Simultaneous filing. User may not have seen previous data. Comment (Src): Simultaneous filing. User may not have seen previous data.  Resp 18 Comment: Simultaneous filing. User may not have seen previous data.  SpO2 99% Comment: Simultaneous filing. User may not have seen previous data.  Physical Exam Vitals and nursing note reviewed.  Constitutional:      General: He is not in acute distress.    Appearance: Normal appearance. He is not toxic-appearing.  HENT:     Head: Normocephalic.     Mouth/Throat:     Mouth: Mucous membranes are moist.     Pharynx: Oropharynx is clear.  Eyes:     General: No scleral icterus.    Conjunctiva/sclera: Conjunctivae normal.     Pupils: Pupils are equal, round, and reactive to light.  Cardiovascular:     Rate and Rhythm: Normal rate and regular rhythm.     Pulses: Normal pulses.   Pulmonary:     Effort: Pulmonary effort is normal.     Breath sounds: Normal breath sounds.  Abdominal:     General: Bowel sounds are normal. There is no distension.     Palpations: Abdomen is soft.     Tenderness: There is no abdominal tenderness.  Musculoskeletal:     Cervical back: Normal range of motion.  Skin:    General: Skin is warm and dry.     Capillary Refill: Capillary refill takes less than 2 seconds.  Neurological:     General: No focal deficit present.     Mental Status: He is alert and oriented to person, place, and time. Mental status is at baseline.  Psychiatric:        Mood and Affect: Mood normal.        Behavior: Behavior normal.        Thought Content: Thought content normal.    ED Results / Procedures / Treatments   Labs (all labs ordered are listed, but only abnormal results are displayed) Labs Reviewed  CBC WITH DIFFERENTIAL/PLATELET - Abnormal; Notable for the following components:      Result Value   WBC 11.8 (*)    RBC 2.95 (*)    Hemoglobin 9.2 (*)    HCT 29.1 (*)    Neutro Abs 9.2 (*)    All other components within normal limits  BASIC METABOLIC PANEL - Abnormal; Notable for the following components:   CO2 21 (*)    BUN 27 (*)    Creatinine, Ser 1.30 (*)    Calcium 8.6 (*)    All other components within normal limits  URINALYSIS, ROUTINE W REFLEX MICROSCOPIC - Abnormal; Notable for the following components:   Glucose, UA 50 (*)    Hgb urine dipstick MODERATE (*)    Protein, ur 100 (*)    All other components within normal limits  CBG MONITORING, ED - Abnormal; Notable for the following components:   Glucose-Capillary 207 (*)    All other components within normal limits  CBG MONITORING, ED - Abnormal; Notable for the following components:   Glucose-Capillary 290 (*)    All other components within normal limits  CBG MONITORING,  ED - Abnormal; Notable for the following components:   Glucose-Capillary 251 (*)    All other components  within normal limits  CBG MONITORING, ED  CBG MONITORING, ED  CBG MONITORING, ED  CBG MONITORING, ED  CBG MONITORING, ED    EKG None  Radiology No results found.  Procedures Procedures   Medications Ordered in ED Medications - No data to display  ED Course  I have reviewed the triage vital signs and the nursing notes.  Pertinent labs & imaging results that were available during my care of the patient were reviewed by me and considered in my medical decision making (see chart for details).    MDM Rules/Calculators/A&P 28yoM who presents to the emergency department with HPI, PE and workup as described above.   Initial BG here 91, improved to 207 --> 290 --> 251  while here. UA without ketonuria  Leukocytosis to 11.8, likely reactive  Hgb 9.2 similar to previous  Cr 1.3 similar to previous, no AG   Tolerating PO without nausea or vomiting. Vitals stable.  Glucose remains stable after 4.5 hour here in the ED.  No signs of infectious as precipitous for hypoglycemia  Has been seen previously for same. At that time diabetes RN contacted who suggested reducing lantus to 80% of dose. At this time, no diabetes RN available so will decrease to 80% of dose 40U-->32U Lantus q morning  Patient clinically stable for discharge. Mentating appropriately. He has a follow-up appointment with Dr. Joya Gaskins and nursing visits twice a week. I have instructed him to ensure that they check his blood glucose.  Instructed him to eat frequent meals over the next few days.  Understands change in lantus dosing with teach back   Discharge  Final Clinical Impression(s) / ED Diagnoses Final diagnoses:  Hypoglycemia    Rx / DC Orders ED Discharge Orders          Ordered    insulin glargine (LANTUS) 100 UNIT/ML Solostar Pen  Daily       Note to Pharmacy: [place]   05/19/21 2258             Mickie Hillier, PA-C 05/20/21 0045    Carmin Muskrat, MD 05/22/21  2225

## 2021-05-19 NOTE — ED Provider Notes (Signed)
Emergency Medicine Provider Triage Evaluation Note  Rodney Barber , a 55 y.o. male  was evaluated in triage.  Pt complains of hypoglycemia.  Patient is t insulin-dependent diabetic.  He takes Lantus, 40 units in the morning.  Patient noticed that his sugar was only 140 this morning so he held off on his NovoLog and has not taken any other insulin since that time.  Patient went to the gas station to get a soda and some snacks and states that he was try to talk to them into the counter but could not understand what he was saying.  The staff at the gas station knows him well and noticed that he was not acting right and called 911.  EMS found him to have a blood sugar of 41.  He was given 15 mg of glucagon and given half of a Coca-Cola and has only improved to a blood sugar of 80 prior to arrival.  Current CBG 93   Review of Systems  Positive: Hypoglycemia  Negative: vomiting  Physical Exam  There were no vitals taken for this visit. Gen:   Awake, no distress   Resp:  Normal effort  MSK:   Moves extremities without difficulty  Other:    Medical Decision Making  Medically screening exam initiated at 6:43 PM.  Appropriate orders placed.  Rodney Barber was informed that the remainder of the evaluation will be completed by another provider, this initial triage assessment does not replace that evaluation, and the importance of remaining in the ED until their evaluation is complete.  Pt currently eating. I have ordered labs will monitor CBG.   Arthor Captain, PA-C 05/19/21 1847    Charlynne Pander, MD 05/19/21 [phone]

## 2021-05-19 NOTE — ED Triage Notes (Addendum)
Patient here from home reporting hypoglycemia while at a store. Initially 46 confusion, slurring speech. 15 g oral glucose given CBG now 83. Had an early lunch at the [facility]. States that he did not take the Humalog today.

## 2021-05-19 NOTE — Discharge Instructions (Signed)
You are seen in the emergency department for low blood sugar.  While you were here your blood sugars were good after you ate.  We checked your blood sugars a number of times and observed you for about 4-1/2 hours.  I would like you to decrease your insulin Lantus dosing to 32 units a day, which is 80% of your normal.  Last time you are here for hypoglycemia we did the same thing.  Please continue to eat meals frequently over the next few days in order to keep your blood sugar from bottoming out.  Please return to the emergency department if you have any symptoms of hypoglycemia again.

## 2021-05-20 ENCOUNTER — Other Ambulatory Visit (HOSPITAL_COMMUNITY): Payer: Self-pay

## 2021-05-20 ENCOUNTER — Telehealth: Payer: Self-pay | Admitting: Critical Care Medicine

## 2021-05-20 NOTE — Telephone Encounter (Signed)
PT walked in stating Nurse Myriam Jacobson told him to come to [facility] to get Labwork done.( Lab needs orders to come from Dr. [name] to draw his labs- Pt was informed we'd reach out to PCP to request change and contact pt for [facility] when ready.

## 2021-05-24 ENCOUNTER — Other Ambulatory Visit: Payer: Self-pay | Admitting: Critical Care Medicine

## 2021-05-24 MED ORDER — ASPIRIN 81 MG PO CHEW: CHEWABLE_TABLET | ORAL | 0 refills | Status: DC

## 2021-05-24 NOTE — Progress Notes (Signed)
Called the pt.  Was in ED.  Low sugar.  Eats Lunch at 1030am  long time til dinner  Told to stay with lantus 32 units daily in AM and two humolog doses 7 units AM and lunch but HOLD if < 150 and told to have a mid afternoon snack like glucerna drink  Also has hematuria from ED visit suspect is from renal stone and is contributing to iron def anemia  Needs urology f/u appt .  Will discuss with rhonda barrett who will be in clinic tomorrow 10/19

## 2021-05-25 ENCOUNTER — Other Ambulatory Visit: Payer: Self-pay | Admitting: Critical Care Medicine

## 2021-05-25 ENCOUNTER — Encounter: Payer: Self-pay | Admitting: Physician Assistant

## 2021-05-25 DIAGNOSIS — R296 Repeated falls: Secondary | ICD-10-CM

## 2021-05-25 DIAGNOSIS — E1169 Type 2 diabetes mellitus with other specified complication: Secondary | ICD-10-CM

## 2021-05-25 DIAGNOSIS — F1011 Alcohol abuse, in remission: Secondary | ICD-10-CM

## 2021-05-25 DIAGNOSIS — R4189 Other symptoms and signs involving cognitive functions and awareness: Secondary | ICD-10-CM

## 2021-05-25 DIAGNOSIS — I2510 Atherosclerotic heart disease of native coronary artery without angina pectoris: Secondary | ICD-10-CM

## 2021-05-25 DIAGNOSIS — R269 Unspecified abnormalities of gait and mobility: Secondary | ICD-10-CM

## 2021-05-25 NOTE — Progress Notes (Addendum)
On 10/13, Mr Boen says he at breakfast and lunch. His CBG dropped to 46. He was given oral supplement and glucagon, his sugars improved.   The ER decreased his Lantus to 32 U q d.   Am CBGs have been 140s this week. He has not been taking the Humalog.   He feels weak and a little groggy. CBG 77.  He was given a Glucerna and a protein bar. Likes vanilla, strawberry, ?cookies and cream.  He plans to drink a Glucerna and add a snack in the afternoon. He was given multiple different snacks, he will let us know which he likes.  He admits to not drinking much water, was asked to drink 3-4 bottles per day.   Had Hematuria, hold ASA for now  Lab Results  Component Value Date   IRON 42 (L) 10/25/2019   TIBC 186 (L) 10/25/2019   FERRITIN 312 12/29/2019   Legs continue to be weak, no LE edema, no CP, no SOB. Does not see blood in Urine.  100/52, 67 HR, 97% O2 CBG 77 Lungs CTA bilat, CV RRR soft SEM No LE edema, distal pulses intact  Urinalysis    Component Value Date/Time   COLORURINE YELLOW 05/19/2021 2114   APPEARANCEUR CLEAR 05/19/2021 2114   LABSPEC 1.014 05/19/2021 2114   PHURINE 5.0 05/19/2021 2114   GLUCOSEU 50 (A) 05/19/2021 2114   HGBUR MODERATE (A) 05/19/2021 2114   BILIRUBINUR NEGATIVE 05/19/2021 2114   KETONESUR NEGATIVE 05/19/2021 2114   PROTEINUR 100 (A) 05/19/2021 2114   NITRITE NEGATIVE 05/19/2021 2114   LEUKOCYTESUR NEGATIVE 05/19/2021 2114   [employer] [facility] contacted, they say it could be months before they hear anything.  Does not need to get PT eval or Neuro eval right now. Wait and see what they say.   While in the office, he drank a Glucerna and a protein drink and ate a protein bar.  By the time he left, he was feeling much better.  Theodore Demark, PA-C 05/25/2021 2:28 PM

## 2021-05-25 NOTE — Progress Notes (Signed)
Neuro and PT eval needed for disability evaluations to achieve Medicaid and SSDI

## 2021-06-01 ENCOUNTER — Encounter: Payer: Self-pay | Admitting: Physician Assistant

## 2021-06-01 ENCOUNTER — Other Ambulatory Visit: Payer: Self-pay | Admitting: *Deleted

## 2021-06-01 NOTE — Progress Notes (Signed)
CBG has not been low since last week.  Still on 32 U Lantus qd, did not take till after lunch. Missed breakfast. CBG was 286. Took meal coverage 7 U at lunch, not at breakfast.  Myriam Jacobson called the lawyer, they said at least 3-6 more months.   They are considering putting him in winter lodging in a hotel. No decision right now.   Lungs clear, heart RRR  Because the lawyer's office said the paperwork had already been submitted for disability, no need for PT eval at this time, appt canceled.  Pt given pumpkin nutrition drinks and a new blanket.   Theodore Demark, PA-C 06/01/2021 3:43 PM

## 2021-06-02 ENCOUNTER — Other Ambulatory Visit: Payer: Self-pay | Admitting: *Deleted

## 2021-06-02 ENCOUNTER — Other Ambulatory Visit (HOSPITAL_COMMUNITY): Payer: Self-pay

## 2021-06-02 MED ORDER — CARVEDILOL 25 MG PO TABS
25.0000 mg | ORAL_TABLET | Freq: Two times a day (BID) | ORAL | 5 refills | Status: DC
  Filled 2021-06-02: qty 60, 30d supply, fill #0

## 2021-06-02 MED ORDER — INSULIN PEN NEEDLE 31G X 5 MM MISC
6 refills | Status: DC
  Filled 2021-06-02: qty 100, 30d supply, fill #0

## 2021-06-02 MED ORDER — GLUCOSE BLOOD VI STRP
ORAL_STRIP | 12 refills | Status: DC
Start: 2021-06-02 — End: 2021-06-07
  Filled 2021-06-02: qty 100, 30d supply, fill #0

## 2021-06-02 MED ORDER — ISOSORBIDE MONONITRATE ER 30 MG PO TB24
30.0000 mg | ORAL_TABLET | Freq: Every day | ORAL | 3 refills | Status: DC
  Filled 2021-06-02: qty 30, 30d supply, fill #0

## 2021-06-02 MED ORDER — PANTOPRAZOLE SODIUM 40 MG PO TBEC
40.0000 mg | DELAYED_RELEASE_TABLET | Freq: Every day | ORAL | 5 refills | Status: DC
  Filled 2021-06-02: qty 30, 30d supply, fill #0

## 2021-06-02 MED ORDER — ATORVASTATIN CALCIUM 80 MG PO TABS
80.0000 mg | ORAL_TABLET | Freq: Every day | ORAL | 0 refills | Status: DC
  Filled 2021-06-02: qty 30, 30d supply, fill #0

## 2021-06-02 MED ORDER — LOSARTAN POTASSIUM 100 MG PO TABS
100.0000 mg | ORAL_TABLET | Freq: Every day | ORAL | 5 refills | Status: DC
  Filled 2021-06-02: qty 30, 30d supply, fill #0

## 2021-06-02 MED ORDER — FERROUS SULFATE 325 (65 FE) MG PO TABS
325.0000 mg | ORAL_TABLET | Freq: Every day | ORAL | 3 refills | Status: DC
  Filled 2021-06-02: qty 60, 30d supply, fill #0

## 2021-06-02 MED ORDER — SPIRONOLACTONE 25 MG PO TABS
25.0000 mg | ORAL_TABLET | Freq: Every day | ORAL | 5 refills | Status: DC
  Filled 2021-06-02: qty 30, 30d supply, fill #0

## 2021-06-07 ENCOUNTER — Other Ambulatory Visit: Payer: Self-pay

## 2021-06-07 ENCOUNTER — Encounter: Payer: Self-pay | Admitting: Medical

## 2021-06-07 ENCOUNTER — Ambulatory Visit (INDEPENDENT_AMBULATORY_CARE_PROVIDER_SITE_OTHER): Payer: Medicaid Other | Admitting: Medical

## 2021-06-07 VITALS — BP 116/56 | HR 69 | Ht 68.0 in | Wt 138.8 lb

## 2021-06-07 DIAGNOSIS — I10 Essential (primary) hypertension: Secondary | ICD-10-CM | POA: Diagnosis not present

## 2021-06-07 DIAGNOSIS — I5042 Chronic combined systolic (congestive) and diastolic (congestive) heart failure: Secondary | ICD-10-CM | POA: Diagnosis not present

## 2021-06-07 DIAGNOSIS — I2510 Atherosclerotic heart disease of native coronary artery without angina pectoris: Secondary | ICD-10-CM | POA: Diagnosis not present

## 2021-06-07 DIAGNOSIS — E785 Hyperlipidemia, unspecified: Secondary | ICD-10-CM

## 2021-06-07 DIAGNOSIS — E119 Type 2 diabetes mellitus without complications: Secondary | ICD-10-CM

## 2021-06-07 DIAGNOSIS — I739 Peripheral vascular disease, unspecified: Secondary | ICD-10-CM

## 2021-06-07 NOTE — Progress Notes (Signed)
Cardiology Office Note   Date:  06/07/2021   ID:  Rodney Barber, DOB July 22, 1966, MRN [id number]  PCP:  Elsie Stain, MD  Cardiologist:  Peter Martinique, MD EP: None  Chief Complaint  Patient presents with   Follow-up    CAD, CHF, leg pain      History of Present Illness: Rodney Barber is a 55 y.o. male with a PMH of nonobstructive CAD, chronic combined CHF/N ICM, HTN, HLD, DM type II, malnutrition, substance use disorder, tobacco abuse, and homelessness, who presents for 28-monthfollow-up.  His cardiomyopathy history dates back to 12/2019 when he was found to have EF 35-40% on echo.  NST at that time suggested a large defect concerning for prior MI with peri-infarct ischemia. He ultimately underwent underwent a LHC in 12/2019 which showed 70% mid RCA stenosis, 60% mid-distal LCx stenosis, and 30% proximal-mid LAD stenosis which was managed with medical therapy.  He was last evaluated by cardiology at an outpatient visit with Rodney Sims NP 03/2021 at which time he had complaints of fatigue, mild dizziness, and weakness in his legs.  He had no anginal complaints or volume overload complaints.  He reported compliance with his medications.  He was recommended to undergo repeat echocardiogram to reevaluate LV function.  This occurred 04/2021 and showed improvement in EF to 60 to 65%, no RWMA, G1DD, and no significant valvular dysfunction.  He presents today for close follow-up.  He reports improvement in his dizziness.  He continues to have some weakness/discomfort in his legs which occurs occur within 20-30 minutes into walking.  Walking is his primary mode of transportation and he reports missing the ability to walk longer distances.  He states his legs stay cold from the knees down and he has occasional tingling/pain in his feet.  He denies rubor on dependency or poorly healing wounds.  He otherwise reports rare twinges of chest discomfort which lasts for seconds at a time.  He  denies any exertional chest pain, shortness of breath, DOE, palpitations, dizziness, lightheadedness, syncope, lower extremity edema, orthopnea, PND.  Past Medical History:  Diagnosis Date   [place]   Diabetes mellitus    Hypertension    Pancreatitis     Past Surgical History:  Procedure Laterality Date   ERCP  06/27/2011   Procedure: ENDOSCOPIC RETROGRADE CHOLANGIOPANCREATOGRAPHY (ERCP);  Surgeon: MJeryl Columbia MD;  [facility];  Service: Endoscopy;  Laterality: N/A;   ERCP W/ PLASTIC STENT PLACEMENT  03/2011   IR [place] ASP/DRAIN  05/16/2019   LEFT HEART CATH AND CORONARY ANGIOGRAPHY N/A 01/02/2020   Procedure: LEFT HEART CATH AND CORONARY ANGIOGRAPHY;  Surgeon: JMartinique Peter M, MD;  [facility];  Service: Cardiovascular;  Laterality: N/A;   TEE WITHOUT CARDIOVERSION N/A 05/20/2019   Procedure: TRANSESOPHAGEAL ECHOCARDIOGRAM (TEE);  Surgeon: HPixie Casino MD;  [facility];  Service: Cardiovascular;  Laterality: N/A;     Current Outpatient Medications  Medication Sig Dispense Refill   aspirin 81 MG chewable tablet HOLD due to Hematuria 120 tablet 0   atorvastatin (LIPITOR) 80 MG tablet Take 1 tablet (80 mg total) by mouth daily at 6pm 60 tablet 0   blood glucose meter kit and supplies KIT Dispense based on patient and insurance preference. Use up to four times daily as directed. (FOR ICD-9 250.00, 250.01). 1 each 0   carvedilol (COREG) 25 MG tablet Take 1 tablet (25 mg total) by mouth 2 (two)  times daily with a meal. 60 tablet 5   feeding supplement, GLUCERNA SHAKE, (GLUCERNA SHAKE) LIQD Take 237 mLs by mouth 3 (three) times daily between meals.  0   ferrous sulfate (FEROSUL) 325 (65 FE) MG tablet Take 1 tablet (325 mg total) by mouth 2 times daily with a meal. 60 tablet 3   Glucose 4-6 GM-MG CHEW Chew one as needed for blood glucose < 70 (Patient taking differently: Chew 1 tablet by mouth 2  (two) times daily as needed (low glucose). Chew one as needed for blood glucose < 70) 100 tablet 0   glucose blood test strip Use as directed 3 times daily to test blood sugar. 100 strip 12   ibuprofen (ADVIL) 200 MG tablet Take 400 mg by mouth every 6 (six) hours as needed for fever, headache or mild pain.     insulin glargine (LANTUS) 100 UNIT/ML Solostar Pen Inject 32 Units into the skin daily. 15 mL 11   insulin lispro (HUMALOG) 100 UNIT/ML KwikPen Inject 7 Units into the skin 2 (two) times daily before a meal. Skip your dose if you do not eat or if your sugar is <150. 6 mL 1   Insulin Pen Needle 31G X 5 MM MISC USE WITH INSULIN PENS 100 each 6   isosorbide mononitrate (IMDUR) 30 MG 24 hr tablet Take 1 tablet (30 mg total) by mouth daily. 45 tablet 3   losartan (COZAAR) 100 MG tablet Take 1 tablet (100 mg total) by mouth daily. 30 tablet 5   nitroGLYCERIN (NITROSTAT) 0.4 MG SL tablet Place 1 tablet (0.4 mg total) under the tongue every 5 (five) minutes as needed for chest pain. 100 tablet 3   Pancrelipase, Lip-Prot-Amyl, 6000-19000 units CPEP Take 2 capsules (12,000 Units total) by mouth in the morning, at noon, and at bedtime. 360 capsule 0   pantoprazole (PROTONIX) 40 MG tablet Take 1 tablet (40 mg total) by mouth daily. 30 tablet 5   spironolactone (ALDACTONE) 25 MG tablet Take 1 tablet (25 mg total) by mouth daily. 60 tablet 5   triamcinolone cream (KENALOG) 0.1 % Apply 1 application topically 2 (two) times daily. 30 g 0   No current facility-administered medications for this visit.    Allergies:   Patient has no known allergies.    Social History:  The patient  reports that he has been smoking cigarettes. He has a 15.00 pack-year smoking history. He has never used smokeless tobacco. He reports that he does not currently use drugs after having used the following drugs: "Crack" cocaine, Other-see comments, and Cocaine. He reports that he does not drink alcohol.   Family History:  The  patient's family history includes Diabetes in his brother and mother.    ROS:  Please see the history of present illness.   Otherwise, review of systems are positive for none.   All other systems are reviewed and negative.    PHYSICAL EXAM: VS:  BP (!) 116/56 (BP Location: Left Arm, Patient Position: Sitting, Cuff Size: Normal)   Pulse 69   Ht 5' 8"  (1.727 m)   Wt 138 lb 12.8 oz (63 kg)   SpO2 97%   BMI 21.10 kg/m  , BMI Body mass index is 21.1 kg/m. GEN: Well nourished, well developed, in no acute distress HEENT: Sclera anicteric Neck: no JVD, carotid bruits, or masses Cardiac: RRR; no murmurs, rubs, or gallops, trace-1+ L>R LE edema  Respiratory:  clear to auscultation bilaterally, normal work of breathing GI: soft, nontender,  nondistended, + BS MS: no deformity or atrophy Skin: warm and dry, no rash Neuro:  Strength and sensation are intact Psych: euthymic mood, full affect   EKG:  EKG is not ordered today.   Recent Labs: 03/21/2021: ALT 32 05/19/2021: BUN 27; Creatinine, Ser 1.30; Hemoglobin 9.2; Platelets 192; Potassium 3.9; Sodium 138    Lipid Panel    Component Value Date/Time   CHOL 93 (L) 03/21/2021 1038   TRIG 89 03/21/2021 1038   HDL 31 (L) 03/21/2021 1038   CHOLHDL 3.0 03/21/2021 1038   CHOLHDL 3.7 01/01/2020 0550   VLDL 20 01/01/2020 0550   LDLCALC 44 03/21/2021 1038      Wt Readings from Last 3 Encounters:  06/07/21 138 lb 12.8 oz (63 kg)  03/21/21 137 lb 12.8 oz (62.5 kg)  03/18/21 135 lb 6.4 oz (61.4 kg)      Other studies Reviewed: Additional studies/ records that were reviewed today include:   LHC 12/2019: Prox LAD to Dist LAD lesion is 30% stenosed. Mid Cx to Dist Cx lesion is 60% stenosed. Mid RCA lesion is 70% stenosed. There is severe left ventricular systolic dysfunction. LV end diastolic pressure is mildly elevated. The left ventricular ejection fraction is 25-35% by visual estimate.   1. Diffuse coronary atherosclerosis with  heavy calcification. 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 Severe LV dysfunction EF estimated at 25-30% 3. Elevated EDP 24 mm Hg   Plan: aggressive medical therapy for CHF. Will add aldactone to Coreg, lasix, and losartan. Titrate as BP allows. High dose statin therapy. LV dysfunction is out of proportion to the degree of CAD.  Diagnostic Dominance: Right   Echocardiogram 04/2021: 1. Left ventricular ejection fraction, by estimation, is 60 to 65%. The  left ventricle has normal function. The left ventricle has no regional  wall motion abnormalities. Left ventricular diastolic parameters are  consistent with Grade I diastolic  dysfunction (impaired relaxation).   2. Right ventricular systolic function is normal. The right ventricular  size is normal.   3. The mitral valve is abnormal. Trivial mitral valve regurgitation.   4. The aortic valve is tricuspid. Aortic valve regurgitation is not  visualized.   5. The inferior vena cava is normal in size with greater than 50%  respiratory variability, suggesting right atrial pressure of 3 mmHg.   Comparison(s): Changes from prior study are noted. 05/04/2020: LVEF 35-40%.   ASSESSMENT AND PLAN:   1.  Nonobstructive coronary artery disease: Has known diffuse, predominantly moderate CAD (70% mid RCA, 60% mid-distal LCx, and 30% proximal-mid LAD stenosis) on cath in 12/2019.  He has no anginal complaints.  He tells me he has been off aspirin for a couple months due to hematuria. - We will reach out to PCP to determine barriers to restarting aspirin.  He has known kidney stone which was felt to be contributing to his hematuria and anemia - question need for urology evaluation. -Continue statin -Continue beta-blocker and Imdur -Continue aggressive risk factor modifications for goal BP <130/80, LDL <70, and A1c <7.  2.  Chronic combined CHF/NICM: EF as low as 35-40% on echo 12/2019 with recovery of LV function on last echo  04/2021 with EF 60-65%.  He has no volume overload complaints and appears euvolemic on exam today -Continue carvedilol, losartan, and spironolactone  3. HTN: BP 116/56 today - Managed in the context of #2  4. HLD: LDL 44 03/2021; at goal of <70 - Continue atorvastatin  5. DM type  2: A1C 8.7 02/2021 improved from 14.5 04/2020; goal <7 - Continue insulin per PCP - Would consider addition of SGLT2 inhibitor +/- Ozempic if not cost prohibitive.  6.  Claudication: He reports pain in bilateral legs approximately 20-30 minutes into walking.  Also reports cold feet and occasional tingling/numbness.  He had LEAs/ABIs 06/2020 which showed abnormal right toe-brachial index otherwise normal without significant lower extremity arterial disease. - Will update LEAs/ABIs to ensure no change since last year   Current medicines are reviewed at length with the patient today.  The patient does not have concerns regarding medicines.  The following changes have been made: As above  Labs/ tests ordered today include:   Orders Placed This Encounter  Procedures   VAS [place] ABI WITH/WO TBI   VAS [place] LOWER EXTREMITY ARTERIAL DUPLEX     Disposition:   FU with Dr. Martinique in 3 months  Signed, Abigail Butts, PA-C  06/07/2021 2:54 PM

## 2021-06-07 NOTE — Patient Instructions (Addendum)
Medication Instructions:  Your physician recommends that you continue on your current medications as directed. Please refer to the Current Medication list given to you today.  *If you need a refill on your cardiac medications before your next appointment, please call your pharmacy*   Lab Work: None If you have labs (blood work) drawn today and your tests are completely normal, you will receive your results only by: MyChart Message (if you have MyChart) OR A paper copy in the mail If you have any lab test that is abnormal or we need to change your treatment, we will call you to review the results.   Testing/Procedures: Your physician has requested that you have an ankle brachial index (ABI). During this test an ultrasound and blood pressure cuff are used to evaluate the arteries that supply the arms and legs with blood. Allow thirty minutes for this exam. There are no restrictions or special instructions.   Your physician has requested that you have a lower extremity arterial duplex. This test is an ultrasound of the arteries in the legs. It looks at arterial blood flow in the legs. Allow one hour for Lower Arterial scans. There are no restrictions or special instructions    Follow-Up: At [facility], you and your health needs are our priority.  As part of our continuing mission to provide you with exceptional heart care, we have created designated Provider Care Teams.  These Care Teams include your primary Cardiologist (physician) and Advanced Practice Providers (APPs -  Physician Assistants and Nurse Practitioners) who all work together to provide you with the care you need, when you need it.  We recommend signing up for the patient portal called "MyChart".  Sign up information is provided on this After Visit Summary.  MyChart is used to connect with patients for Virtual Visits (Telemedicine).  Patients are able to view lab/test results, encounter notes, upcoming appointments, etc.   Non-urgent messages can be sent to your provider as well.   To learn more about what you can do with MyChart, go to [email or web address].    Your next appointment:   3 month(s)  The format for your next appointment:   In Person  Provider:   [name], MD   Other Instructions -Please speak to [facility] and your primary doctor about your kidney stone. It is ideal to get you back on your aspirin daily.   -Have Myriam Jacobson reach out to [place] with any concerns about your medications.

## 2021-06-08 ENCOUNTER — Other Ambulatory Visit: Payer: Self-pay | Admitting: Critical Care Medicine

## 2021-06-08 ENCOUNTER — Other Ambulatory Visit (HOSPITAL_COMMUNITY): Payer: Self-pay

## 2021-06-08 ENCOUNTER — Encounter: Payer: Self-pay | Admitting: Physician Assistant

## 2021-06-08 MED ORDER — INSULIN LISPRO (1 UNIT DIAL) 100 UNIT/ML (KWIKPEN)
7.0000 [IU] | PEN_INJECTOR | Freq: Two times a day (BID) | SUBCUTANEOUS | 1 refills | Status: DC
  Filled 2021-06-08: qty 3, 21d supply, fill #0

## 2021-06-08 MED ORDER — INSULIN GLARGINE 100 UNIT/ML SOLOSTAR PEN
32.0000 [IU] | PEN_INJECTOR | Freq: Every day | SUBCUTANEOUS | 11 refills | Status: DC
  Filled 2021-06-08: qty 9, 28d supply, fill #0

## 2021-06-08 NOTE — Progress Notes (Signed)
Dr [name] spoke w/ Dr Cordella Register. They have a resident clinic on Thursday afternoons. 10 slots/day. Myriam Jacobson will try to get him in there.   He is almost out of Lantus >> reordered.   He missed his insulin yesterday, CBG 364 this am.  10/21 CBG 559.   He is not seeing blood in his urine, but has tested positive.   Has some numbness and tingling in his R arm.   At the Cards office, he was describing cold feeling in his kneecaps and down into his feet. ABI's are ordered.   Generally compliant w/ meds.   Theodore Demark, PA-C 06/08/2021 4:54 PM

## 2021-06-09 ENCOUNTER — Other Ambulatory Visit (HOSPITAL_COMMUNITY): Payer: Self-pay

## 2021-06-14 ENCOUNTER — Ambulatory Visit: Payer: Self-pay | Admitting: Physical Therapy

## 2021-06-15 ENCOUNTER — Encounter: Payer: Self-pay | Admitting: Physician Assistant

## 2021-06-15 NOTE — Progress Notes (Signed)
Pt seen by Dr [name].  His sugars have been ok.   He is taking 32 U lantus and 2 doses of meal coverage 7 u daily.  He got a flu shot today.   He is still holding ASA.   He is trying to snack mid afternoon, likes [employer] peanut butter cups. They have some sucralose in them, but only 1 gm net carbs and good fiber/protein.  Breathing is good. Was reminded of his appt for ABIs next week.   Still having problems with paresthesias in his R arm, starts in the forearm and goes to his hand. Worse in the tips of his fingers. There is no obvious ROM deficit, circulation is good.   His legs get weak, both equally.  Can start early in the morning. Some good days and some bad days.   He remembers being told that he had a stroke in the past. Incidental finding in 2021.   Doing ok today, but the sx are annoying.  Rodney Demark, PA-C 06/15/2021 3:38 PM

## 2021-06-21 ENCOUNTER — Ambulatory Visit (HOSPITAL_COMMUNITY)
Admission: RE | Admit: 2021-06-21 | Discharge: 2021-06-21 | Disposition: A | Discharge status: Home / Self Care | Payer: Medicaid Other | Source: Ambulatory Visit | Attending: Cardiovascular Disease | Admitting: Cardiovascular Disease

## 2021-06-21 ENCOUNTER — Other Ambulatory Visit: Payer: Self-pay

## 2021-06-21 DIAGNOSIS — I739 Peripheral vascular disease, unspecified: Secondary | ICD-10-CM | POA: Diagnosis not present

## 2021-06-22 ENCOUNTER — Encounter: Payer: Self-pay | Admitting: Physician Assistant

## 2021-06-22 NOTE — Progress Notes (Signed)
Pt seen by Dr [name].  The other day he had a CBG 40 overnight. EMS was called and checked his CBG, gave him glucose. He refused transport.   35 U Lantus, 7 U breakfast and dinner that day.   Did not check his sugar that day. Forgot that he was supposed to take the Humalog at about noon. Forgot that he is not supposed to take the Humalog if CBG < 150  Has not checked his sugar in over a week.  PW wrote out instructions on the Humalog.

## 2021-06-28 ENCOUNTER — Encounter: Payer: Self-pay | Admitting: Physician Assistant

## 2021-06-28 ENCOUNTER — Other Ambulatory Visit (HOSPITAL_COMMUNITY): Payer: Self-pay

## 2021-06-28 ENCOUNTER — Other Ambulatory Visit: Payer: Self-pay | Admitting: Physician Assistant

## 2021-06-28 MED ORDER — GABAPENTIN 100 MG PO CAPS
100.0000 mg | ORAL_CAPSULE | Freq: Three times a day (TID) | ORAL | 0 refills | Status: DC
  Filled 2021-06-28 – 2021-07-14 (×2): qty 90, 30d supply, fill #0

## 2021-06-28 NOTE — Progress Notes (Signed)
Rodney Barber has been depressed lately. His situation is hard on him because of the loud and aggressive personalities of some residents.   Pt had ABIs, they were ok, but he is still having pain in his legs and in his R arm.   On CT from 12/2019, degenerative changes in the thoracic and lumbar spine were noted.   He is also at high risk for neuropathic pain.   Will add gabapentin 100 mg qhs, can easily up-titrate   CBG was 83 today. He was given snacks and ate some before leaving. He has had lunch. He will continue current Insulin regimen and is very aware of the need to manage intake at Thanksgiving.   Rodney Demark, PA-C 06/28/2021 2:03 PM

## 2021-07-06 ENCOUNTER — Other Ambulatory Visit (HOSPITAL_COMMUNITY): Payer: Self-pay

## 2021-07-06 ENCOUNTER — Other Ambulatory Visit: Payer: Self-pay | Admitting: *Deleted

## 2021-07-06 MED ORDER — SPIRONOLACTONE 25 MG PO TABS
25.0000 mg | ORAL_TABLET | Freq: Every day | ORAL | 5 refills | Status: DC
  Filled 2021-07-06: qty 30, 30d supply, fill #0

## 2021-07-06 MED ORDER — INSULIN LISPRO (1 UNIT DIAL) 100 UNIT/ML (KWIKPEN)
7.0000 [IU] | PEN_INJECTOR | Freq: Two times a day (BID) | SUBCUTANEOUS | 1 refills | Status: DC
  Filled 2021-07-06: qty 3, 21d supply, fill #0

## 2021-07-06 MED ORDER — INSULIN GLARGINE 100 UNIT/ML SOLOSTAR PEN
32.0000 [IU] | PEN_INJECTOR | Freq: Every day | SUBCUTANEOUS | 11 refills | Status: DC
  Filled 2021-07-06: qty 9, 28d supply, fill #0

## 2021-07-06 MED ORDER — PANTOPRAZOLE SODIUM 40 MG PO TBEC
40.0000 mg | DELAYED_RELEASE_TABLET | Freq: Every day | ORAL | 5 refills | Status: DC
  Filled 2021-07-06: qty 30, 30d supply, fill #0

## 2021-07-07 ENCOUNTER — Encounter: Payer: Self-pay | Admitting: Critical Care Medicine

## 2021-07-08 ENCOUNTER — Other Ambulatory Visit (HOSPITAL_COMMUNITY): Payer: Self-pay

## 2021-07-08 NOTE — Progress Notes (Signed)
This patient was seen today in the [place] clinic.  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  We have been following this patient very closely in the shelter since early of 2021  Note this patient is an established patient in our community health and wellness clinic and is actually been followed in that clinic on a close basis since 2014.  This patient's diabetes has been severely difficult to control and much of it relied upon the fact that he acted more like a type I diabetic.  Because of this he developed severe diabetic neuropathy with frequent falls.  Note his alcohol use previously also contributed to his neuropathy.  The patient also struggles with organizing his medications and taking his medications on a proper basis.  We started to follow the patient in the shelter on a nearly weekly basis in February 2021.  Note before he was intensely case managed in the shelter he was coming to the hospital for admission on a monthly basis for diabetic ketoacidosis or hyperosmolar coma.  He would definitely benefit from Social Security disability.  He is not able to care for himself properly.  Ideal circumstance would be if he was in an assisted living circumstance.  Note he has also developed coronary artery disease in the interim.  He also has acute on chronic heart failure with this condition.  Since being intensely case managed in the shelter he is only had about 2 emergency room visits in the past year as compared to nearly monthly and weekly visits previous.  If he leaves the shelter and goes back into independent living I am fearful he will fall back into his prior pattern.  Another challenges he does not have any insurance whatsoever and we have been 100% funding all of his medications through a patient assistance program.  Today the patient's blood  sugar is 180 he is in no acute distress and doing very well from the standpoint of his diabetes  Note he is needing a walker on a continuous basis without it he will have frequent falls.  He has difficulty with gait without the use of the walker due to insensate lower extremities.  We have evaluated his lower extremities he does not have peripheral artery disease  Current status of his feet are stable at this time without any diabetic ulcerations or infections

## 2021-07-11 ENCOUNTER — Other Ambulatory Visit: Payer: Self-pay

## 2021-07-11 ENCOUNTER — Emergency Department (HOSPITAL_COMMUNITY): Payer: Medicaid Other

## 2021-07-11 ENCOUNTER — Emergency Department (HOSPITAL_COMMUNITY)
Admission: EM | Admit: 2021-07-11 | Discharge: 2021-07-11 | Disposition: A | Discharge status: Home / Self Care | Payer: Medicaid Other | Attending: Emergency Medicine | Admitting: Emergency Medicine

## 2021-07-11 ENCOUNTER — Encounter (HOSPITAL_COMMUNITY): Payer: Self-pay

## 2021-07-11 DIAGNOSIS — R109 Unspecified abdominal pain: Secondary | ICD-10-CM | POA: Diagnosis present

## 2021-07-11 DIAGNOSIS — E1169 Type 2 diabetes mellitus with other specified complication: Secondary | ICD-10-CM | POA: Insufficient documentation

## 2021-07-11 DIAGNOSIS — N200 Calculus of kidney: Secondary | ICD-10-CM | POA: Insufficient documentation

## 2021-07-11 DIAGNOSIS — I110 Hypertensive heart disease with heart failure: Secondary | ICD-10-CM | POA: Diagnosis not present

## 2021-07-11 DIAGNOSIS — I2510 Atherosclerotic heart disease of native coronary artery without angina pectoris: Secondary | ICD-10-CM | POA: Insufficient documentation

## 2021-07-11 DIAGNOSIS — I10 Essential (primary) hypertension: Secondary | ICD-10-CM

## 2021-07-11 DIAGNOSIS — K8020 Calculus of gallbladder without cholecystitis without obstruction: Secondary | ICD-10-CM | POA: Insufficient documentation

## 2021-07-11 DIAGNOSIS — Z79899 Other long term (current) drug therapy: Secondary | ICD-10-CM | POA: Insufficient documentation

## 2021-07-11 DIAGNOSIS — N1339 Other hydronephrosis: Secondary | ICD-10-CM | POA: Insufficient documentation

## 2021-07-11 DIAGNOSIS — E785 Hyperlipidemia, unspecified: Secondary | ICD-10-CM | POA: Diagnosis not present

## 2021-07-11 DIAGNOSIS — Z794 Long term (current) use of insulin: Secondary | ICD-10-CM | POA: Diagnosis not present

## 2021-07-11 DIAGNOSIS — I5042 Chronic combined systolic (congestive) and diastolic (congestive) heart failure: Secondary | ICD-10-CM | POA: Insufficient documentation

## 2021-07-11 DIAGNOSIS — Z7982 Long term (current) use of aspirin: Secondary | ICD-10-CM | POA: Diagnosis not present

## 2021-07-11 DIAGNOSIS — F17210 Nicotine dependence, cigarettes, uncomplicated: Secondary | ICD-10-CM | POA: Insufficient documentation

## 2021-07-11 DIAGNOSIS — N23 Unspecified renal colic: Secondary | ICD-10-CM

## 2021-07-11 HISTORY — DX: Anemia, unspecified: D64.9

## 2021-07-11 LAB — COMPREHENSIVE METABOLIC PANEL
ALT: 25 U/L (ref 0–44)
AST: 21 U/L (ref 15–41)
Albumin: 3.9 g/dL (ref 3.5–5.0)
Alkaline Phosphatase: 95 U/L (ref 38–126)
Anion gap: 7 (ref 5–15)
BUN: 20 mg/dL (ref 6–20)
CO2: 20 mmol/L — ABNORMAL LOW (ref 22–32)
Calcium: 8.9 mg/dL (ref 8.9–10.3)
Chloride: 112 mmol/L — ABNORMAL HIGH (ref 98–111)
Creatinine, Ser: 1.17 mg/dL (ref 0.61–1.24)
GFR, Estimated: >60 mL/min (ref >60)
Glucose, Bld: 108 mg/dL — ABNORMAL HIGH (ref 70–99)
Potassium: 3.8 mmol/L (ref 3.5–5.1)
Sodium: 139 mmol/L (ref 135–145)
Total Bilirubin: 0.8 mg/dL (ref 0.3–1.2)
Total Protein: 7 g/dL (ref 6.5–8.1)

## 2021-07-11 LAB — CBC WITH DIFFERENTIAL/PLATELET
Abs Immature Granulocytes: 0.04 10*3/uL (ref 0.00–0.07)
Basophils Absolute: 0.1 10*3/uL (ref 0.0–0.1)
Basophils Relative: 1 %
Eosinophils Absolute: 0.2 10*3/uL (ref 0.0–0.5)
Eosinophils Relative: 2 %
HCT: 32.3 % — ABNORMAL LOW (ref 39.0–52.0)
Hemoglobin: 10 g/dL — ABNORMAL LOW (ref 13.0–17.0)
Immature Granulocytes: 0 %
Lymphocytes Relative: 13 %
Lymphs Abs: 1.4 10*3/uL (ref 0.7–4.0)
MCH: 29.7 pg (ref 26.0–34.0)
MCHC: 31 g/dL (ref 30.0–36.0)
MCV: 95.8 fL (ref 80.0–100.0)
Monocytes Absolute: 0.6 10*3/uL (ref 0.1–1.0)
Monocytes Relative: 5 %
Neutro Abs: 8.6 10*3/uL — ABNORMAL HIGH (ref 1.7–7.7)
Neutrophils Relative %: 79 %
Platelets: 181 10*3/uL (ref 150–400)
RBC: 3.37 MIL/uL — ABNORMAL LOW (ref 4.22–5.81)
RDW: 13 % (ref 11.5–15.5)
WBC: 10.9 10*3/uL — ABNORMAL HIGH (ref 4.0–10.5)
nRBC: 0 % (ref 0.0–0.2)

## 2021-07-11 LAB — URINALYSIS, ROUTINE W REFLEX MICROSCOPIC
Bacteria, UA: NONE SEEN
Bilirubin Urine: NEGATIVE
Glucose, UA: NEGATIVE mg/dL
Ketones, ur: 5 mg/dL — AB
Leukocytes,Ua: NEGATIVE
Nitrite: NEGATIVE
Protein, ur: >=300 mg/dL — AB
Specific Gravity, Urine: 1.017 (ref 1.005–1.030)
pH: 5 (ref 5.0–8.0)

## 2021-07-11 LAB — TROPONIN I (HIGH SENSITIVITY)
Troponin I (High Sensitivity): 13 ng/L (ref <18)
Troponin I (High Sensitivity): 17 ng/L (ref <18)

## 2021-07-11 LAB — LIPASE, BLOOD: Lipase: 18 U/L (ref 11–51)

## 2021-07-11 LAB — LACTIC ACID, PLASMA
Lactic Acid, Venous: 1.6 mmol/L (ref 0.5–1.9)
Lactic Acid, Venous: 1 mmol/L (ref 0.5–1.9)

## 2021-07-11 MED ORDER — MORPHINE SULFATE (PF) 4 MG/ML IV SOLN
4.0000 mg | Freq: Once | INTRAVENOUS | Status: AC
  Administered 2021-07-11: 4 mg via INTRAVENOUS
  Filled 2021-07-11: qty 1

## 2021-07-11 MED ORDER — HYDRALAZINE HCL 20 MG/ML IJ SOLN
10.0000 mg | Freq: Once | INTRAMUSCULAR | Status: AC
  Administered 2021-07-11: 10 mg via INTRAVENOUS
  Filled 2021-07-11: qty 1

## 2021-07-11 MED ORDER — HYDRALAZINE HCL 20 MG/ML IJ SOLN
5.0000 mg | Freq: Once | INTRAMUSCULAR | Status: AC
  Administered 2021-07-11: 5 mg via INTRAVENOUS
  Filled 2021-07-11: qty 1

## 2021-07-11 MED ORDER — HYDROCODONE-ACETAMINOPHEN 5-325 MG PO TABS: 1.0000 | ORAL_TABLET | Freq: Four times a day (QID) | ORAL | 0 refills | Status: DC | PRN

## 2021-07-11 MED ORDER — ONDANSETRON HCL 4 MG/2ML IJ SOLN
4.0000 mg | Freq: Once | INTRAMUSCULAR | Status: AC
  Administered 2021-07-11: 4 mg via INTRAVENOUS
  Filled 2021-07-11: qty 2

## 2021-07-11 MED ORDER — ONDANSETRON 4 MG PO TBDP: ORAL_TABLET | ORAL | 0 refills | Status: DC

## 2021-07-11 MED ORDER — SODIUM CHLORIDE 0.9 % IV BOLUS
1000.0000 mL | Freq: Once | INTRAVENOUS | Status: AC
  Administered 2021-07-11: 1000 mL via INTRAVENOUS

## 2021-07-11 NOTE — Discharge Instructions (Signed)
You have a kidney stone on the left side.  The stone is large and you will likely need to see urology.  Take Tylenol or Motrin for pain.  Take Norco for severe pain  Stay hydrated.  Take Zofran for nausea  Your blood pressure is elevated and please take your blood pressure medicines as prescribed by your doctor.  See your primary care doctor  Return to ER if you have worse flank pain, abdominal pain, chest pain, trouble breathing

## 2021-07-11 NOTE — ED Provider Notes (Signed)
[place] DEPT Provider Note   CSN: [id number] Arrival date & time: 07/11/21  1552     History Chief Complaint  Patient presents with   Back Pain   Emesis    Rodney Barber is a 55 y.o. male hx of DM, HTN, pancreatitis here presenting with abdominal pain and back pain.  Patient states that he has left-sided back pain since yesterday.  He also vomited and unable to keep his medicine down.  Denies any chest pain.  Patient denies any urinary symptoms.  Patient has history of kidney stones and felt similar to his previous kidney stone.  Patient was noted to be hypertensive in triage but has a history of hypertension and was unable to keep down his BP meds.  The history is provided by the patient.      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   Anemia    Diabetes mellitus    Hypertension    Pancreatitis     Patient Active Problem List   Diagnosis Date Noted   Coronary artery disease 04/20/2020   S/P thoracentesis    Chronic combined systolic (congestive) and diastolic (congestive) heart failure ([place])    Essential hypertension    Tobacco abuse 05/14/2019   Substance use disorder 02/18/2019   Uncontrolled type 2 diabetes mellitus 03/15/2018   Hyperlipidemia associated with type 2 diabetes mellitus ([place]) 09/08/2013   Severe malnutrition ([place]) 07/11/2013   Protein-calorie malnutrition, severe ([place]) 07/11/2013    Past Surgical History:  Procedure Laterality Date   ERCP  06/27/2011   Procedure: ENDOSCOPIC RETROGRADE CHOLANGIOPANCREATOGRAPHY (ERCP);  Surgeon: Jeryl Columbia, MD;  Location: [facility];  Service: Endoscopy;  Laterality: N/A;   ERCP W/ PLASTIC STENT PLACEMENT  03/2011   IR US GUIDE BX ASP/DRAIN  05/16/2019   LEFT HEART CATH AND CORONARY ANGIOGRAPHY N/A 01/02/2020   Procedure: LEFT HEART CATH AND CORONARY ANGIOGRAPHY;  Surgeon: Martinique, Peter M, MD;  Location: [facility];   Service: Cardiovascular;  Laterality: N/A;   TEE WITHOUT CARDIOVERSION N/A 05/20/2019   Procedure: TRANSESOPHAGEAL ECHOCARDIOGRAM (TEE);  Surgeon: [name], MD;  Location: [facility] ENDOSCOPY;  Service: Cardiovascular;  Laterality: N/A;       Family History  Problem Relation Age of Onset   Diabetes Mother    Diabetes Brother     Social History   Tobacco Use   Smoking status: Every Day    Packs/day: 0.50    Years: 30.00    Pack years: 15.00    Types: Cigarettes   Smokeless tobacco: Never   Tobacco comments:    6 cigarett /day  Vaping Use   Vaping Use: Never used  Substance Use Topics   Alcohol use: No   Drug use: Not Currently    Types: "Crack" cocaine, Other-see comments, Cocaine    Comment: last smoked crack 12-16-16    Home Medications Prior to Admission medications   Medication Sig Start Date End Date Taking? Authorizing Provider  aspirin 81 MG chewable tablet HOLD due to Hematuria 05/24/21   Elsie Stain, MD  atorvastatin (LIPITOR) 80 MG tablet Take 1 tablet (80 mg total) by mouth daily at 6pm 06/02/21   Elsie Stain, MD  blood glucose meter kit and supplies KIT Dispense based on patient and insurance preference. Use up to four times daily as directed. (FOR ICD-9 250.00, 250.01). 11/15/19   Little Ishikawa, MD  carvedilol (COREG) 25 MG tablet Take 1  tablet (25 mg total) by mouth 2 (two) times daily with a meal. 06/02/21   Elsie Stain, MD  feeding supplement, GLUCERNA SHAKE, (GLUCERNA SHAKE) LIQD Take 237 mLs by mouth 3 (three) times daily between meals. 09/11/19   Mariel Aloe, MD  ferrous sulfate (FEROSUL) 325 (65 FE) MG tablet Take 1 tablet (325 mg total) by mouth 2 times daily with a meal. 06/02/21   Elsie Stain, MD  gabapentin (NEURONTIN) 100 MG capsule Take 1 capsule (100 mg total) by mouth 3 (three) times daily. 06/28/21   Barrett, Evelene Croon, PA-C  Glucose 4-6 GM-MG CHEW Chew one as needed for blood glucose < 70 Patient taking  differently: Chew 1 tablet by mouth 2 (two) times daily as needed (low glucose). Chew one as needed for blood glucose < 70 05/06/20   Elsie Stain, MD  glucose blood test strip Use as directed 3 times daily to test blood sugar. 05/04/21   Barrett, Evelene Croon, PA-C  ibuprofen (ADVIL) 200 MG tablet Take 400 mg by mouth every 6 (six) hours as needed for fever, headache or mild pain.    [provider]  insulin glargine (LANTUS) 100 UNIT/ML Solostar Pen Inject 32 Units into the skin daily. 07/06/21   Elsie Stain, MD  insulin lispro (HUMALOG) 100 UNIT/ML KwikPen Inject 7 Units into the skin 2 (two) times daily before a meal. Skip your dose if you do not eat or if your sugar is <150. 07/06/21   Elsie Stain, MD  Insulin Pen Needle 31G X 5 MM MISC USE WITH INSULIN PENS 06/02/21 06/02/22  Elsie Stain, MD  isosorbide mononitrate (IMDUR) 30 MG 24 hr tablet Take 1 tablet (30 mg total) by mouth daily. 06/02/21   Elsie Stain, MD  losartan (COZAAR) 100 MG tablet Take 1 tablet (100 mg total) by mouth daily. 06/02/21   Elsie Stain, MD  nitroGLYCERIN (NITROSTAT) 0.4 MG SL tablet Place 1 tablet (0.4 mg total) under the tongue every 5 (five) minutes as needed for chest pain. 12/15/20   Elsie Stain, MD  Pancrelipase, Lip-Prot-Amyl, 6000-19000 units CPEP Take 2 capsules (12,000 Units total) by mouth in the morning, at noon, and at bedtime. 04/13/21   Elsie Stain, MD  pantoprazole (PROTONIX) 40 MG tablet Take 1 tablet (40 mg total) by mouth daily. 07/06/21   Elsie Stain, MD  spironolactone (ALDACTONE) 25 MG tablet Take 1 tablet (25 mg total) by mouth daily. 07/06/21   Elsie Stain, MD  triamcinolone cream (KENALOG) 0.1 % Apply 1 application topically 2 (two) times daily. 03/30/21   Elsie Stain, MD  Ferrous Sulfate Dried 200 (65 Fe) MG TABS TAKE 200 MG BY MOUTH 2 (TWO) TIMES DAILY WITH A MEAL. 02/02/21 04/20/21  Elsie Stain, MD    Allergies    Patient has  no known allergies.  Review of Systems   Review of Systems  Musculoskeletal:  Positive for back pain.  All other systems reviewed and are negative.  Physical Exam Updated Vital Signs BP (!) 192/84   Pulse 88   Temp 97.8 F (36.6 C) (Oral)   Resp 18   Ht _0  (1.727 m)   Wt 62.1 kg   SpO2 99%   BMI 20.83 kg/m   Physical Exam Vitals and nursing note reviewed.  Constitutional:      Appearance: Normal appearance.     Comments: Uncomfortable  HENT:     Head: Normocephalic.  Nose: Nose normal.     Mouth/Throat:     Mouth: Mucous membranes are moist.  Eyes:     Extraocular Movements: Extraocular movements intact.     Pupils: Pupils are equal, round, and reactive to light.  Cardiovascular:     Rate and Rhythm: Normal rate and regular rhythm.     Pulses: Normal pulses.     Heart sounds: Normal heart sounds.  Pulmonary:     Effort: Pulmonary effort is normal.     Breath sounds: Normal breath sounds.  Abdominal:     General: Abdomen is flat.     Palpations: Abdomen is soft.     Comments: Mild left CVA tenderness  Musculoskeletal:        General: Normal range of motion.     Cervical back: Normal range of motion and neck supple.  Skin:    General: Skin is warm.     Capillary Refill: Capillary refill takes less than 2 seconds.  Neurological:     General: No focal deficit present.     Mental Status: He is alert and oriented to person, place, and time.  Psychiatric:        Mood and Affect: Mood normal.        Behavior: Behavior normal.    ED Results / Procedures / Treatments   Labs (all labs ordered are listed, but only abnormal results are displayed) Labs Reviewed  COMPREHENSIVE METABOLIC PANEL - Abnormal; Notable for the following components:      Result Value   Chloride 112 (*)    CO2 20 (*)    Glucose, Bld 108 (*)    All other components within normal limits  CBC WITH DIFFERENTIAL/PLATELET - Abnormal; Notable for the following components:   WBC 10.9 (*)     RBC 3.37 (*)    Hemoglobin 10.0 (*)    HCT 32.3 (*)    Neutro Abs 8.6 (*)    All other components within normal limits  URINALYSIS, ROUTINE W REFLEX MICROSCOPIC - Abnormal; Notable for the following components:   Hgb urine dipstick LARGE (*)    Ketones, ur 5 (*)    Protein, ur >=300 (*)    All other components within normal limits  LACTIC ACID, PLASMA  LACTIC ACID, PLASMA  LIPASE, BLOOD  TROPONIN I (HIGH SENSITIVITY)  TROPONIN I (HIGH SENSITIVITY)    EKG None  Radiology CT Renal Stone Study  Result Date: 07/11/2021 CLINICAL DATA:  Left flank pain. EXAM: CT ABDOMEN AND PELVIS WITHOUT CONTRAST TECHNIQUE: Multidetector CT imaging of the abdomen and pelvis was performed following the standard protocol without IV contrast. COMPARISON:  CT abdomen and pelvis 10/06/2020. FINDINGS: Lower chest: No acute abnormality. Hepatobiliary: No focal liver abnormality is seen. No gallstones, gallbladder wall thickening, small gallstones are present. There is no biliary ductal dilatation. No focal liver lesions are identified. Pancreas: Pancreatic calcifications, atrophy and ductal dilatation appear unchanged from the prior examination. Spleen: Normal in size without focal abnormality. Adrenals/Urinary Tract: Left adrenal adenoma is unchanged. Right adrenal gland is within normal limits. There is an 18 mm calculus in the left renal pelvis similar to the prior study. There is stable mild left-sided hydronephrosis. There are additional smaller bilateral renal calculi also similar to the prior study. Bilateral ureters are within normal limits as well as the bladder. There is mild bilateral perinephric fat stranding. This is similar to the prior study. Right adrenal gland is within normal limits. Stomach/Bowel: Stomach is within normal limits. Appendix appears normal.  No evidence of bowel wall thickening, distention, or inflammatory changes. There is a large amount of stool throughout the colon.  Vascular/Lymphatic: Aortic atherosclerosis. No enlarged abdominal or pelvic lymph nodes. Reproductive: Prostate is unremarkable. Other: No abdominal wall hernia or abnormality. No abdominopelvic ascites. Musculoskeletal: No acute or significant osseous findings. IMPRESSION: 1. Unchanged 18 mm calculus in the left renal pelvis. There is stable mild left-sided hydronephrosis. 2. Mild bilateral perinephric fat stranding is unchanged and can be seen in the setting of medical renal disease or infection. 3. Additional nonobstructing bilateral renal calculi. 4. Large stool burden.  No bowel obstruction. 5. Cholelithiasis. 6. Stable chronic changes in the pancreas. 7.  Aortic Atherosclerosis (ICD10-I70.0). Electronically Signed   By: Ronney Asters M.D.   On: 07/11/2021 17:25    Procedures Procedures   Medications Ordered in ED Medications  morphine 4 MG/ML injection 4 mg (4 mg Intravenous Given 07/11/21 2120)  ondansetron (ZOFRAN) injection 4 mg (4 mg Intravenous Given 07/11/21 2119)  sodium chloride 0.9 % bolus 1,000 mL (1,000 mLs Intravenous New Bag/Given 07/11/21 2119)  hydrALAZINE (APRESOLINE) injection 5 mg (5 mg Intravenous Given 07/11/21 2121)    ED Course  I have reviewed the triage vital signs and the nursing notes.  Pertinent labs & imaging results that were available during my care of the patient were reviewed by me and considered in my medical decision making (see chart for details).    MDM Rules/Calculators/A&P                           Rodney Barber is a 55 y.o. male here presenting with left flank pain.  Patient has a history of kidney stone.  Patient is hypertensive.  Consider renal colic.  Patient has a history of hypertension and I do not think he has a dissection right now.  We will get CT renal stone and CBC and CMP and urinalysis  10:17 PM CBC and CMP unremarkable.  Urinalysis showed some blood.  Patient does have 18 mm calculus in left renal pelvis and also left-sided hydro-.   Patient's pain is under control now.  Patient's blood pressure went down from over 200 to about 190 after hydralazine.  At this point, I think patient is stable for discharge.  Will refer to urology for renal colic.  We will discharge patient home with Zofran for nausea and told him to take his blood pressure medicines as prescribed  Final Clinical Impression(s) / ED Diagnoses Final diagnoses:  None    Rx / DC Orders ED Discharge Orders     None        Drenda Freeze, MD 07/11/21 2218

## 2021-07-11 NOTE — ED Provider Notes (Signed)
Emergency Medicine Provider Triage Evaluation Note  Rodney Barber , a 55 y.o. male  was evaluated in triage.  Pt complains of left-sided back pain.  He states this started yesterday.  He has vomited today.  He did not take his medicines this morning due to vomiting. He states this feels like in the past when he has had issues with his kidney stones.  He denies any fevers.  No chest pain or shortness of breath.  Review of Systems  Positive: Left sided back pain/flank pain, vomiting Negative: fevers  Physical Exam  BP (!) 191/76 (BP Location: Left Arm)   Pulse 72   Temp 97.8 F (36.6 C) (Oral)   Resp 16   Ht 5\' 8"  (1.727 m)   Wt 62.1 kg   SpO2 100%   BMI 20.83 kg/m  Gen:   Awake, no distress   Resp:  Normal effort  MSK:   Moves extremities without difficulty  Other:  Left-sided CVA tenderness to percussion.  No left-sided flank tenderness to palpation.  Medical Decision Making  Medically screening exam initiated at 4:43 PM.  Appropriate orders placed.  Rodney Barber was informed that the remainder of the evaluation will be completed by another provider, this initial triage assessment does not replace that evaluation, and the importance of remaining in the ED until their evaluation is complete.  Note: Portions of this report may have been transcribed using voice recognition software. Every effort was made to ensure accuracy; however, inadvertent computerized transcription errors may be present    [name], PA-C 07/11/21 1644    14/05/22, MD 07/11/21 [phone]

## 2021-07-11 NOTE — ED Triage Notes (Addendum)
Per EMS- patient c/o "all over back pain since yesterday" and emesis today.   Patienat is from [employer].

## 2021-07-13 ENCOUNTER — Other Ambulatory Visit: Payer: Self-pay | Admitting: Critical Care Medicine

## 2021-07-13 ENCOUNTER — Other Ambulatory Visit (HOSPITAL_COMMUNITY): Payer: Self-pay

## 2021-07-13 MED ORDER — HYDROCODONE-ACETAMINOPHEN 5-325 MG PO TABS
1.0000 | ORAL_TABLET | Freq: Four times a day (QID) | ORAL | 0 refills | Status: DC | PRN
  Filled 2021-07-13: qty 10, 3d supply, fill #0

## 2021-07-14 ENCOUNTER — Other Ambulatory Visit: Payer: Self-pay | Admitting: *Deleted

## 2021-07-14 ENCOUNTER — Other Ambulatory Visit (HOSPITAL_COMMUNITY): Payer: Self-pay

## 2021-07-14 MED ORDER — ATORVASTATIN CALCIUM 80 MG PO TABS
80.0000 mg | ORAL_TABLET | Freq: Every day | ORAL | 0 refills | Status: DC
  Filled 2021-07-14: qty 30, 30d supply, fill #0

## 2021-07-15 ENCOUNTER — Other Ambulatory Visit (HOSPITAL_COMMUNITY): Payer: Self-pay

## 2021-07-17 NOTE — Progress Notes (Incomplete)
Established Patient Office Visit  Subjective:  Patient ID: Rodney Barber, male    DOB: 08/02/1966  Age: 55 y.o. MRN: [id number]  CC: No chief complaint on file.   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   Anemia    Diabetes mellitus    Hypertension    Pancreatitis     Past Surgical History:  Procedure Laterality Date   ERCP  06/27/2011   Procedure: ENDOSCOPIC RETROGRADE CHOLANGIOPANCREATOGRAPHY (ERCP);  Surgeon: Jeryl Columbia, MD;  Location: [facility];  Service: Endoscopy;  Laterality: N/A;   ERCP W/ PLASTIC STENT PLACEMENT  03/2011   IR US GUIDE BX ASP/DRAIN  05/16/2019   LEFT HEART CATH AND CORONARY ANGIOGRAPHY N/A 01/02/2020   Procedure: LEFT HEART CATH AND CORONARY ANGIOGRAPHY;  Surgeon: Martinique, Peter M, MD;  Location: [facility];  Service: Cardiovascular;  Laterality: N/A;   TEE WITHOUT CARDIOVERSION N/A 05/20/2019   Procedure: TRANSESOPHAGEAL ECHOCARDIOGRAM (TEE);  Surgeon: [name], MD;  Location: [facility] ENDOSCOPY;  Service: Cardiovascular;  Laterality: N/A;    Family History  Problem Relation Age of Onset   Diabetes Mother    Diabetes Brother     Social History   Socioeconomic History   Marital status: Single    Spouse name: Not on file   Number of children: Not on file   Years of education: Not on file   Highest education level: Not on file  Occupational History   Not on file  Tobacco Use   Smoking status: Every Day    Packs/day: 0.50    Years: 30.00    Pack years: 15.00    Types: Cigarettes   Smokeless tobacco: Never   Tobacco comments:    6 cigarett /day  Vaping Use   Vaping Use: Never used  Substance and Sexual Activity   Alcohol use: No   Drug use: Not Currently    Types: "Crack" cocaine, Other-see comments, Cocaine    Comment: last smoked crack 12-16-16   Sexual activity: Never  Other Topics Concern   Not  on file  Social History Narrative   Not on file   Social Determinants of Health   Financial Resource Strain: Not on file  Food Insecurity: Not on file  Transportation Needs: Not on file  Physical Activity: Not on file  Stress: Not on file  Social Connections: Not on file  Intimate Partner Violence: Not on file    Outpatient Medications Prior to Visit  Medication Sig Dispense Refill   aspirin 81 MG chewable tablet HOLD due to Hematuria 120 tablet 0   atorvastatin (LIPITOR) 80 MG tablet Take 1 tablet (80 mg total) by mouth daily at 6pm 60 tablet 0   blood glucose meter kit and supplies KIT Dispense based on patient and insurance preference. Use up to four times daily as directed. (FOR ICD-9 250.00, 250.01). 1 each 0   carvedilol (COREG) 25 MG tablet Take 1 tablet (25 mg total) by mouth 2 (two) times daily with a meal. 60 tablet 5   feeding supplement, GLUCERNA SHAKE, (GLUCERNA SHAKE) LIQD Take 237 mLs by mouth 3 (three) times daily between meals.  0   ferrous sulfate (FEROSUL) 325 (65 FE) MG tablet Take 1 tablet (325 mg total) by mouth 2 times daily with a meal. 60 tablet 3   gabapentin (NEURONTIN) 100 MG capsule Take 1 capsule (100 mg total) by mouth 3 (three) times daily.  90 capsule 0   Glucose 4-6 GM-MG CHEW Chew one as needed for blood glucose < 70 (Patient taking differently: Chew 1 tablet by mouth 2 (two) times daily as needed (low glucose). Chew one as needed for blood glucose < 70) 100 tablet 0   glucose blood test strip Use as directed 3 times daily to test blood sugar. 100 strip 12   HYDROcodone-acetaminophen (NORCO/VICODIN) 5-325 MG tablet Take 1 tablet by mouth every 6 (six) hours as needed. 10 tablet 0   ibuprofen (ADVIL) 200 MG tablet Take 400 mg by mouth every 6 (six) hours as needed for fever, headache or mild pain.     insulin glargine (LANTUS) 100 UNIT/ML Solostar Pen Inject 32 Units into the skin daily. 15 mL 11   insulin lispro (HUMALOG) 100 UNIT/ML  KwikPen Inject 7 Units into the skin 2 (two) times daily before a meal. Skip your dose if you do not eat or if your sugar is <150. 6 mL 1   Insulin Pen Needle 31G X 5 MM MISC USE WITH INSULIN PENS 100 each 6   isosorbide mononitrate (IMDUR) 30 MG 24 hr tablet Take 1 tablet (30 mg total) by mouth daily. 45 tablet 3   losartan (COZAAR) 100 MG tablet Take 1 tablet (100 mg total) by mouth daily. 30 tablet 5   nitroGLYCERIN (NITROSTAT) 0.4 MG SL tablet Place 1 tablet (0.4 mg total) under the tongue every 5 (five) minutes as needed for chest pain. 100 tablet 3   ondansetron (ZOFRAN-ODT) 4 MG disintegrating tablet 89m ODT q4 hours prn nausea/vomit 10 tablet 0   Pancrelipase, Lip-Prot-Amyl, 6000-19000 units CPEP Take 2 capsules (12,000 Units total) by mouth in the morning, at noon, and at bedtime. 360 capsule 0   pantoprazole (PROTONIX) 40 MG tablet Take 1 tablet (40 mg total) by mouth daily. 30 tablet 5   spironolactone (ALDACTONE) 25 MG tablet Take 1 tablet (25 mg total) by mouth daily. 60 tablet 5   triamcinolone cream (KENALOG) 0.1 % Apply 1 application topically 2 (two) times daily. 30 g 0   No facility-administered medications prior to visit.    No Known Allergies  ROS Review of Systems    Objective:    Physical Exam  There were no vitals taken for this visit. Wt Readings from Last 3 Encounters:  07/11/21 137 lb (62.1 kg)  06/07/21 138 lb 12.8 oz (63 kg)  03/21/21 137 lb 12.8 oz (62.5 kg)     Health Maintenance Due  Topic Date Due   Zoster Vaccines- Shingrix (1 of 2) Never done   COVID-19 Vaccine (2 - Janssen risk series) 02/27/2020    There are no preventive care reminders to display for this patient.  Lab Results  Component Value Date   TSH 2.097 10/21/2019   Lab Results  Component Value Date   WBC 10.9 (H) 07/11/2021   HGB 10.0 (L) 07/11/2021   HCT 32.3 (L) 07/11/2021   MCV 95.8 07/11/2021   PLT 181 07/11/2021   Lab Results  Component Value Date   NA  139 07/11/2021   K 3.8 07/11/2021   CO2 20 (L) 07/11/2021   GLUCOSE 108 (H) 07/11/2021   BUN 20 07/11/2021   CREATININE 1.17 07/11/2021   BILITOT 0.8 07/11/2021   ALKPHOS 95 07/11/2021   AST 21 07/11/2021   ALT 25 07/11/2021   PROT 7.0 07/11/2021   ALBUMIN 3.9 07/11/2021   CALCIUM 8.9 07/11/2021   ANIONGAP 7 07/11/2021   EGFR 63 03/21/2021  Lab Results  Component Value Date   CHOL 93 (L) 03/21/2021   Lab Results  Component Value Date   HDL 31 (L) 03/21/2021   Lab Results  Component Value Date   LDLCALC 44 03/21/2021   Lab Results  Component Value Date   TRIG 89 03/21/2021   Lab Results  Component Value Date   CHOLHDL 3.0 03/21/2021   Lab Results  Component Value Date   HGBA1C 8.7 (A) 02/21/2021      Assessment & Plan:   Problem List Items Addressed This Visit   None   No orders of the defined types were placed in this encounter.   Follow-up: No follow-ups on file.    Asencion Noble, MD

## 2021-07-18 ENCOUNTER — Other Ambulatory Visit (HOSPITAL_COMMUNITY): Payer: Self-pay

## 2021-07-18 ENCOUNTER — Ambulatory Visit: Payer: Self-pay | Admitting: Critical Care Medicine

## 2021-07-21 ENCOUNTER — Other Ambulatory Visit: Payer: Self-pay | Admitting: *Deleted

## 2021-07-21 ENCOUNTER — Other Ambulatory Visit (HOSPITAL_COMMUNITY): Payer: Self-pay

## 2021-07-21 MED ORDER — ISOSORBIDE MONONITRATE ER 30 MG PO TB24
30.0000 mg | ORAL_TABLET | Freq: Every day | ORAL | 3 refills | Status: DC
Start: 2021-07-21 — End: 2021-08-18
  Filled 2021-07-21: qty 30, 30d supply, fill #0

## 2021-07-21 MED ORDER — CARVEDILOL 25 MG PO TABS
25.0000 mg | ORAL_TABLET | Freq: Two times a day (BID) | ORAL | 5 refills | Status: DC
  Filled 2021-07-21: qty 60, 30d supply, fill #0

## 2021-07-21 MED ORDER — LOSARTAN POTASSIUM 100 MG PO TABS
100.0000 mg | ORAL_TABLET | Freq: Every day | ORAL | 5 refills | Status: DC
  Filled 2021-07-21: qty 30, 30d supply, fill #0

## 2021-07-21 MED ORDER — FERROUS SULFATE 325 (65 FE) MG PO TABS
325.0000 mg | ORAL_TABLET | Freq: Every day | ORAL | 3 refills | Status: DC
  Filled 2021-07-21: qty 60, 30d supply, fill #0

## 2021-07-27 ENCOUNTER — Other Ambulatory Visit: Payer: Self-pay | Admitting: *Deleted

## 2021-07-27 ENCOUNTER — Other Ambulatory Visit (HOSPITAL_COMMUNITY): Payer: Self-pay

## 2021-07-27 ENCOUNTER — Encounter: Payer: Self-pay | Admitting: Physician Assistant

## 2021-07-27 MED ORDER — GABAPENTIN 100 MG PO CAPS
100.0000 mg | ORAL_CAPSULE | Freq: Three times a day (TID) | ORAL | 0 refills | Status: DC
  Filled 2021-07-27: qty 90, 30d supply, fill #0

## 2021-07-27 MED ORDER — BLOOD GLUCOSE MONITOR SYSTEM W/DEVICE KIT
PACK | 0 refills | Status: DC
  Filled 2021-07-27 – 2021-07-28 (×2): qty 1, 1d supply, fill #0

## 2021-07-27 MED ORDER — TRUEPLUS LANCETS 28G MISC
0 refills | Status: DC
Start: 2021-07-27 — End: 2021-09-13
  Filled 2021-07-27: qty 100, 25d supply, fill #0

## 2021-07-27 MED ORDER — GLUCOSE BLOOD VI STRP
ORAL_STRIP | 0 refills | Status: DC
  Filled 2021-07-27: qty 100, 25d supply, fill #0

## 2021-07-27 NOTE — Progress Notes (Addendum)
Pt seen by Dr Lowell Guitar  Mr Rodney Barber got a call from [employer] about his disability, he thinks it was good news, his phone died and he was not able to finish the call.  His kidney stone sent him to the hospital on 12/05. He was told it needs to come out, but will have to give money up front. Rodney Barber will find out how much.   When he takes his shot, his blood sugar will drop very low, in the 80s at times.   He missed his clinic appt w/ Dr [name] on 12/12. He just forgot.  He was checking his blood sugar today, but was getting an error message. Now the meter won't turn on. A new battery was inserted, but it still did not work. His meter will be replaced. An unused meter was in the cabinet, he was given that to use till he gets a new one.   131/71, 68, 99%  He took his blood sugar before lunch and it was in the 150s, he took 7 u lispro.  He has been having problems w/ later afternoon lows.   CBG checked today, 62, this was confirmed on another meter. This is despite him eating a raspberry Twinkie.   He was given a regular Ensure.  Lantus was decreased to 25 u, lispro decreased to 5 u qac.  He feels his legs have gotten better, he is taking the Neurontin twice a day.   Rodney Demark, PA-C 07/27/2021 4:32 PM  55 yo with HF, DM, CAD, etc. Here with concern regarding low BG's.  BG 62 in the room with [place] -> given ensure.  Decrease lantus to 25 and lispro to 5 units.  VS 131/71, HR 68, 99% RA  Agree with documentation as noted above by Rodney Demark, PA-C  Lacretia Nicks

## 2021-07-28 ENCOUNTER — Other Ambulatory Visit (HOSPITAL_COMMUNITY): Payer: Self-pay

## 2021-08-03 ENCOUNTER — Encounter: Payer: Self-pay | Admitting: Critical Care Medicine

## 2021-08-03 ENCOUNTER — Encounter: Payer: Self-pay | Admitting: Physician Assistant

## 2021-08-03 NOTE — Progress Notes (Signed)
Pt seen by Dr [name]  Today CBG 110, no insulin taken. Took Lantus 25 u at lunch along w/ 5 u lispro for CBG 292.   CBG was 32 on 12/26 at 7:30 pm. He ate dinner and had a Boost. It was recommended that he keep the glucose tabs in his pocket right next to his cigarette lighter.  12/24, CBG was 368. Message sent for an appt w/ Pharmacist.  The plan for his kidney stones was explained to him, he understands.  He is ok w/ this, having some pain, requests more Tylenol, was given Tylenol ES 500 mg, can take 2 tabs 3 x day.   Insulin supplies were checked, he has plenty.   Yesterday, he had a little chest pain, resolved w/out intervention. Worse w/ deep inspiration.    BP Readings from Last 3 Encounters:  08/03/21 (!) 168/77  07/11/21 (!) 144/70  06/07/21 (!) 116/56   Pulse Readings from Last 3 Encounters:  08/03/21 68  07/11/21 85  06/07/21 69    Ryenne Lynam, PA-C 08/03/2021 4:34 PM

## 2021-08-10 ENCOUNTER — Other Ambulatory Visit: Payer: Self-pay | Admitting: Critical Care Medicine

## 2021-08-10 ENCOUNTER — Encounter: Payer: Self-pay | Admitting: Physician Assistant

## 2021-08-10 MED ORDER — INSULIN GLARGINE 100 UNIT/ML SOLOSTAR PEN: 25.0000 [IU] | PEN_INJECTOR | Freq: Every day | SUBCUTANEOUS | 11 refills | Status: DC

## 2021-08-10 MED ORDER — INSULIN LISPRO (1 UNIT DIAL) 100 UNIT/ML (KWIKPEN): 5.0000 [IU] | PEN_INJECTOR | Freq: Two times a day (BID) | SUBCUTANEOUS | 1 refills | Status: DC

## 2021-08-10 NOTE — Progress Notes (Signed)
Pt seen by Dr Joya Gaskins.   His disability claim was approved!!! He will get $914/month and a $10K backpay check.   They are trying to get him in [place].  He checks his sugar early am and it is low 90s. Therefore, he has not been taking the Lantus then. He takes it at [facility] [employer]. He takes 5 u short-acting insulin bid.   Today, he fell asleep after lunch and did not take his short-acting insulin. He took the 5 u later.  CBG now 127, 270s-220s 3pm 159 6 pm, 249 @ 4 pm  154/74 99 HR 99% O2 sats  He feels generally well, meds are ok.  He will need to be seen frequently to manage his health issues once he moves out  Rosaria Ferries, PA-C 08/10/2021 4:38 PM

## 2021-08-11 ENCOUNTER — Other Ambulatory Visit (HOSPITAL_COMMUNITY): Payer: Self-pay

## 2021-08-11 ENCOUNTER — Other Ambulatory Visit: Payer: Self-pay | Admitting: *Deleted

## 2021-08-11 MED ORDER — PANTOPRAZOLE SODIUM 40 MG PO TBEC
40.0000 mg | DELAYED_RELEASE_TABLET | Freq: Every day | ORAL | 5 refills | Status: DC
  Filled 2021-08-11: qty 30, 30d supply, fill #0

## 2021-08-11 MED ORDER — PANCRELIPASE (LIP-PROT-AMYL) 6000-19000 UNITS PO CPEP
2.0000 | ORAL_CAPSULE | Freq: Three times a day (TID) | ORAL | 0 refills | Status: DC
  Filled 2021-08-11: qty 180, 30d supply, fill #0

## 2021-08-12 ENCOUNTER — Other Ambulatory Visit (HOSPITAL_COMMUNITY): Payer: Self-pay

## 2021-08-16 ENCOUNTER — Telehealth: Payer: Self-pay

## 2021-08-16 NOTE — Telephone Encounter (Signed)
-----   Message from Elsie Stain, MD sent at 08/10/2021  4:34 PM EST ----- Needs appt with me in January late or early feb   let me know date /time pt has no phone

## 2021-08-16 NOTE — Telephone Encounter (Signed)
Appointment set for 09/27/2021 at 2:00 pm

## 2021-08-18 ENCOUNTER — Other Ambulatory Visit: Payer: Self-pay | Admitting: *Deleted

## 2021-08-18 ENCOUNTER — Other Ambulatory Visit (HOSPITAL_COMMUNITY): Payer: Self-pay

## 2021-08-18 MED ORDER — LOSARTAN POTASSIUM 100 MG PO TABS
100.0000 mg | ORAL_TABLET | Freq: Every day | ORAL | 5 refills | Status: DC
  Filled 2021-08-18: qty 30, 30d supply, fill #0

## 2021-08-18 MED ORDER — ISOSORBIDE MONONITRATE ER 30 MG PO TB24
30.0000 mg | ORAL_TABLET | Freq: Every day | ORAL | 3 refills | Status: DC
  Filled 2021-08-18: qty 30, 30d supply, fill #0

## 2021-08-18 MED ORDER — SPIRONOLACTONE 25 MG PO TABS
25.0000 mg | ORAL_TABLET | Freq: Every day | ORAL | 5 refills | Status: DC
  Filled 2021-08-18: qty 30, 30d supply, fill #0

## 2021-08-23 ENCOUNTER — Other Ambulatory Visit: Payer: Self-pay | Admitting: *Deleted

## 2021-08-23 ENCOUNTER — Other Ambulatory Visit (HOSPITAL_COMMUNITY): Payer: Self-pay

## 2021-08-23 MED ORDER — INSULIN GLARGINE 100 UNIT/ML SOLOSTAR PEN
32.0000 [IU] | PEN_INJECTOR | Freq: Every day | SUBCUTANEOUS | 11 refills | Status: DC
  Filled 2021-08-23: qty 9, 28d supply, fill #0

## 2021-08-23 MED ORDER — INSULIN LISPRO (1 UNIT DIAL) 100 UNIT/ML (KWIKPEN)
7.0000 [IU] | PEN_INJECTOR | Freq: Two times a day (BID) | SUBCUTANEOUS | 11 refills | Status: DC
  Filled 2021-08-23: qty 3, 21d supply, fill #0

## 2021-08-24 ENCOUNTER — Encounter: Payer: Self-pay | Admitting: Physician Assistant

## 2021-08-24 NOTE — Progress Notes (Signed)
Pt seen by Dr Joya Gaskins  125/65, hr 70, O2 sats 100% CBG 183  He has been approved to have an apartment and paid utilities for life!!!!  He is eating better, is willing to eat vegetables. If he wants something sweet, eats fruit.   Teeth are poor, needs soft foods.   Was eating bananas, but is willing to focus on blueberries and other soft fruits w/ less sugar.   Go to a grocery store and shop around the edges. He feels he knows how to pick healthy foods.   He asks about chips, can have a small bag once in a while.   Will need to have help maintaining his pill box.   Has pancreatic insufficiency, but no particular foods bother him.   Sugar was 170 this am.  Thinks he will be at [place], near a [employer].   Rosaria Ferries, PA-C 08/24/2021 3:02 PM

## 2021-08-25 ENCOUNTER — Other Ambulatory Visit: Payer: Self-pay | Admitting: *Deleted

## 2021-08-25 ENCOUNTER — Other Ambulatory Visit (HOSPITAL_COMMUNITY): Payer: Self-pay

## 2021-08-25 MED ORDER — FERROUS SULFATE 325 (65 FE) MG PO TABS
325.0000 mg | ORAL_TABLET | Freq: Two times a day (BID) | ORAL | 3 refills | Status: DC
Start: 2021-08-25 — End: 2021-09-29
  Filled 2021-08-25: qty 60, 60d supply, fill #0
  Filled 2021-08-31: qty 60, 30d supply, fill #0

## 2021-08-25 MED ORDER — ATORVASTATIN CALCIUM 80 MG PO TABS
80.0000 mg | ORAL_TABLET | Freq: Every day | ORAL | 0 refills | Status: DC
  Filled 2021-08-25: qty 30, 30d supply, fill #0

## 2021-08-31 ENCOUNTER — Other Ambulatory Visit (HOSPITAL_COMMUNITY): Payer: Self-pay

## 2021-09-01 ENCOUNTER — Other Ambulatory Visit: Payer: Self-pay | Admitting: *Deleted

## 2021-09-01 ENCOUNTER — Other Ambulatory Visit (HOSPITAL_COMMUNITY): Payer: Self-pay

## 2021-09-01 MED ORDER — CARVEDILOL 25 MG PO TABS
25.0000 mg | ORAL_TABLET | Freq: Two times a day (BID) | ORAL | 5 refills | Status: DC
Start: 2021-09-01 — End: 2021-09-29
  Filled 2021-09-01: qty 60, 30d supply, fill #0

## 2021-09-01 MED ORDER — GABAPENTIN 100 MG PO CAPS
100.0000 mg | ORAL_CAPSULE | Freq: Three times a day (TID) | ORAL | 0 refills | Status: DC
  Filled 2021-09-01: qty 90, 30d supply, fill #0

## 2021-09-01 MED ORDER — INSULIN PEN NEEDLE 31G X 5 MM MISC
6 refills | Status: DC
  Filled 2021-09-01: qty 100, 30d supply, fill #0

## 2021-09-01 MED ORDER — INSULIN PEN NEEDLE 31G X 5 MM MISC
6 refills | Status: DC
  Filled 2021-11-07: qty 100, 25d supply, fill #0

## 2021-09-07 ENCOUNTER — Encounter: Payer: Self-pay | Admitting: Physician Assistant

## 2021-09-07 NOTE — Progress Notes (Signed)
Pt seen by Dr Joya Gaskins  He has full Medicaid and got his first deposit from [employer]!!!  He will be going to [employer] soon.   Now that he has Medicaid, he can see the Urologist.  He will get furniture from the [employer].   His blood sugar this am was 165.  BP 166/81, hr 67, 100% O2  Monitor BP for now, no med changes  Keep in touch, set up a lunch or dinner soon.   Rosaria Ferries, PA-C 09/07/2021 2:12 PM

## 2021-09-08 ENCOUNTER — Other Ambulatory Visit (HOSPITAL_COMMUNITY): Payer: Self-pay

## 2021-09-09 ENCOUNTER — Other Ambulatory Visit (HOSPITAL_COMMUNITY): Payer: Self-pay

## 2021-09-13 ENCOUNTER — Other Ambulatory Visit (HOSPITAL_COMMUNITY): Payer: Self-pay

## 2021-09-13 ENCOUNTER — Telehealth: Payer: Self-pay | Admitting: Critical Care Medicine

## 2021-09-13 DIAGNOSIS — E1165 Type 2 diabetes mellitus with hyperglycemia: Secondary | ICD-10-CM

## 2021-09-13 MED ORDER — ACCU-CHEK GUIDE W/DEVICE KIT
1.0000 | PACK | Freq: Four times a day (QID) | 0 refills | Status: DC
  Filled 2021-09-13: qty 1, 1d supply, fill #0

## 2021-09-13 MED ORDER — ACCU-CHEK SOFTCLIX LANCETS MISC
Freq: Four times a day (QID) | 0 refills | Status: DC
  Filled 2021-09-13: qty 100, 25d supply, fill #0

## 2021-09-13 MED ORDER — TRUEPLUS LANCETS 28G MISC
Freq: Four times a day (QID) | 0 refills | Status: DC
  Filled 2021-09-13: qty 100, 25d supply, fill #0

## 2021-09-13 MED ORDER — ACCU-CHEK GUIDE VI STRP
1.0000 | ORAL_STRIP | Freq: Four times a day (QID) | 0 refills | Status: DC
  Filled 2021-09-13: qty 100, 25d supply, fill #0

## 2021-09-13 MED ORDER — TRUE METRIX BLOOD GLUCOSE TEST VI STRP
ORAL_STRIP | Freq: Four times a day (QID) | 0 refills | Status: DC
  Filled 2021-09-13: qty 100, 25d supply, fill #0

## 2021-09-13 MED ORDER — TRUE METRIX METER W/DEVICE KIT
PACK | Freq: Four times a day (QID) | 0 refills | Status: DC
  Filled 2021-09-13: qty 1, 1d supply, fill #0

## 2021-09-13 NOTE — Telephone Encounter (Signed)
Stanton Kidney from [place]  requesting Nurse Bonnita Nasuti to call her back , requesting verbal orders for accu check products because insurance does not cover TRUEplus Lancets 28G MISC, glucose blood (TRUE METRIX BLOOD GLUCOSE TEST) test strip and Blood Glucose Monitoring Suppl (TRUE METRIX METER) w/Device [facility] Phone:  [phone]  Fax:  [phone]

## 2021-09-14 ENCOUNTER — Other Ambulatory Visit: Payer: Self-pay | Admitting: Critical Care Medicine

## 2021-09-14 ENCOUNTER — Other Ambulatory Visit (HOSPITAL_COMMUNITY): Payer: Self-pay

## 2021-09-14 ENCOUNTER — Encounter: Payer: Self-pay | Admitting: Physician Assistant

## 2021-09-14 MED ORDER — VALSARTAN 320 MG PO TABS
320.0000 mg | ORAL_TABLET | Freq: Every day | ORAL | 3 refills | Status: DC
  Filled 2021-09-14: qty 90, 90d supply, fill #0

## 2021-09-14 MED ORDER — ACCU-CHEK GUIDE VI STRP
ORAL_STRIP | 12 refills | Status: DC
  Filled 2021-09-14: qty 100, fill #0
  Filled 2021-11-07: qty 100, 25d supply, fill #0

## 2021-09-14 MED ORDER — ACCU-CHEK GUIDE W/DEVICE KIT
PACK | 0 refills | Status: AC
  Filled 2021-09-14: qty 1, fill #0

## 2021-09-14 MED ORDER — ACCU-CHEK SOFTCLIX LANCETS MISC
Freq: Four times a day (QID) | 0 refills | Status: DC
  Filled 2021-09-14: qty 100, fill #0
  Filled 2021-09-15: qty 100, 25d supply, fill #0

## 2021-09-14 MED ORDER — INSULIN GLARGINE 100 UNIT/ML SOLOSTAR PEN: 25.0000 [IU] | PEN_INJECTOR | Freq: Every day | SUBCUTANEOUS | 11 refills | Status: DC

## 2021-09-14 NOTE — Telephone Encounter (Signed)
Rxs sent for Accu Chek Guide products.  

## 2021-09-14 NOTE — Progress Notes (Signed)
Pt seen by Dr Joya Gaskins.  He is supposed to get his apartment at the end of this month.  His meter died, because he has insurance, he will get a much better meter now. Bonnita Nasuti will get it tomorrow.   He has not been able to check his sugars,  but he feels ok. He has not been giving himself the Lispro. CBG 286.   BP 166/73, HR 71, 100% O2 sats. Lungs clear, RRR, no MRG  He is on Coreg 25 mg bid, spiro 25 mg qd, losartan 100 mg qd, Imdur 30 mg qd.   BP is not well-controlled, change losartan to an equivalent dose of valsartan, 320 mg qd.   Kidney stones not bothering him much.   Lab Results  Component Value Date   NA 139 07/11/2021   K 3.8 07/11/2021   CO2 20 (L) 07/11/2021   GLUCOSE 108 (H) 07/11/2021   BUN 20 07/11/2021   CREATININE 1.17 07/11/2021   CALCIUM 8.9 07/11/2021   EGFR 63 03/21/2021   GFRNONAA >60 07/11/2021    Rosaria Ferries, PA-C 09/14/2021 4:31 PM

## 2021-09-15 ENCOUNTER — Other Ambulatory Visit (HOSPITAL_COMMUNITY): Payer: Self-pay

## 2021-09-15 ENCOUNTER — Other Ambulatory Visit: Payer: Self-pay | Admitting: *Deleted

## 2021-09-15 MED ORDER — ISOSORBIDE MONONITRATE ER 30 MG PO TB24
30.0000 mg | ORAL_TABLET | Freq: Every day | ORAL | 3 refills | Status: DC
  Filled 2021-09-15: qty 30, 30d supply, fill #0

## 2021-09-15 MED ORDER — PANTOPRAZOLE SODIUM 40 MG PO TBEC
40.0000 mg | DELAYED_RELEASE_TABLET | Freq: Every day | ORAL | 3 refills | Status: DC
  Filled 2021-09-15: qty 30, 30d supply, fill #0

## 2021-09-15 MED ORDER — SPIRONOLACTONE 25 MG PO TABS
25.0000 mg | ORAL_TABLET | Freq: Every day | ORAL | 3 refills | Status: DC
  Filled 2021-09-15: qty 30, 30d supply, fill #0

## 2021-09-21 ENCOUNTER — Other Ambulatory Visit: Payer: Self-pay | Admitting: Physician Assistant

## 2021-09-21 ENCOUNTER — Encounter: Payer: Self-pay | Admitting: Physician Assistant

## 2021-09-21 ENCOUNTER — Other Ambulatory Visit (HOSPITAL_COMMUNITY): Payer: Self-pay

## 2021-09-21 ENCOUNTER — Other Ambulatory Visit: Payer: Self-pay | Admitting: *Deleted

## 2021-09-21 MED ORDER — INSULIN LISPRO (1 UNIT DIAL) 100 UNIT/ML (KWIKPEN): 5.0000 [IU] | PEN_INJECTOR | Freq: Two times a day (BID) | SUBCUTANEOUS | 11 refills | Status: DC

## 2021-09-21 MED ORDER — INSULIN GLARGINE 100 UNIT/ML SOLOSTAR PEN
25.0000 [IU] | PEN_INJECTOR | Freq: Every day | SUBCUTANEOUS | 11 refills | Status: DC
Start: 2021-09-21 — End: 2021-09-27

## 2021-09-21 MED ORDER — AMLODIPINE BESYLATE 5 MG PO TABS
5.0000 mg | ORAL_TABLET | Freq: Every day | ORAL | 11 refills | Status: DC
Start: 2021-09-21 — End: 2021-10-13
  Filled 2021-09-21: qty 30, 30d supply, fill #0

## 2021-09-21 NOTE — Progress Notes (Addendum)
Doing well, blood sugars in 140's this morning.  Vitals with BP 155/71, HR 60, 100% on RA.  He was switched to valsartan last week from losartan, will start amlodipine as BP remains high.  Needs refill of lantus and tylenol as well  Lacretia Nicks, MD  ---   Pt seen by Dr Lowell Guitar.  He is moving to [place] on 10/03/2021. His sister lives near there. He knows the area pretty well.   His sugar was 140s this am, took the Lantus 25 u, but did not take the Humalog 5 u. It is in the computer at 7 u bid, but has been 5 for a while.   His sugars have generally good, but were high while his meter was not working, he has a nice meter now.   155/71 60 100%  He is getting low on the Lantus, needs Tylenol as well.   He has been told that he cannot have any procedures until he pays his bill off, they did not get all the money that

## 2021-09-22 ENCOUNTER — Other Ambulatory Visit (HOSPITAL_COMMUNITY): Payer: Self-pay

## 2021-09-26 ENCOUNTER — Other Ambulatory Visit (HOSPITAL_COMMUNITY): Payer: Self-pay

## 2021-09-27 ENCOUNTER — Encounter: Payer: Self-pay | Admitting: Critical Care Medicine

## 2021-09-27 ENCOUNTER — Other Ambulatory Visit: Payer: Self-pay

## 2021-09-27 ENCOUNTER — Ambulatory Visit: Payer: Medicaid Other | Attending: Critical Care Medicine | Admitting: Critical Care Medicine

## 2021-09-27 VITALS — BP 110/54 | HR 66 | Resp 16 | Wt 145.0 lb

## 2021-09-27 DIAGNOSIS — Z79899 Other long term (current) drug therapy: Secondary | ICD-10-CM | POA: Diagnosis not present

## 2021-09-27 DIAGNOSIS — I110 Hypertensive heart disease with heart failure: Secondary | ICD-10-CM | POA: Diagnosis not present

## 2021-09-27 DIAGNOSIS — Z720 Tobacco use: Secondary | ICD-10-CM

## 2021-09-27 DIAGNOSIS — Z794 Long term (current) use of insulin: Secondary | ICD-10-CM | POA: Diagnosis not present

## 2021-09-27 DIAGNOSIS — Z7985 Long-term (current) use of injectable non-insulin antidiabetic drugs: Secondary | ICD-10-CM | POA: Insufficient documentation

## 2021-09-27 DIAGNOSIS — E785 Hyperlipidemia, unspecified: Secondary | ICD-10-CM | POA: Insufficient documentation

## 2021-09-27 DIAGNOSIS — Z716 Tobacco abuse counseling: Secondary | ICD-10-CM | POA: Insufficient documentation

## 2021-09-27 DIAGNOSIS — K861 Other chronic pancreatitis: Secondary | ICD-10-CM | POA: Diagnosis not present

## 2021-09-27 DIAGNOSIS — I10 Essential (primary) hypertension: Secondary | ICD-10-CM | POA: Diagnosis not present

## 2021-09-27 DIAGNOSIS — F17210 Nicotine dependence, cigarettes, uncomplicated: Secondary | ICD-10-CM | POA: Diagnosis not present

## 2021-09-27 DIAGNOSIS — I5042 Chronic combined systolic (congestive) and diastolic (congestive) heart failure: Secondary | ICD-10-CM

## 2021-09-27 DIAGNOSIS — Z5901 Sheltered homelessness: Secondary | ICD-10-CM | POA: Insufficient documentation

## 2021-09-27 DIAGNOSIS — E1165 Type 2 diabetes mellitus with hyperglycemia: Secondary | ICD-10-CM

## 2021-09-27 DIAGNOSIS — E1169 Type 2 diabetes mellitus with other specified complication: Secondary | ICD-10-CM | POA: Diagnosis not present

## 2021-09-27 DIAGNOSIS — F1990 Other psychoactive substance use, unspecified, uncomplicated: Secondary | ICD-10-CM

## 2021-09-27 DIAGNOSIS — E1142 Type 2 diabetes mellitus with diabetic polyneuropathy: Secondary | ICD-10-CM | POA: Insufficient documentation

## 2021-09-27 LAB — GLUCOSE, POCT (MANUAL RESULT ENTRY): POC Glucose: 96 mg/dl (ref 70–99)

## 2021-09-27 LAB — POCT GLYCOSYLATED HEMOGLOBIN (HGB A1C): HbA1c, POC (controlled diabetic range): 8.1 % — AB (ref 0.0–7.0)

## 2021-09-27 MED ORDER — INSULIN LISPRO (1 UNIT DIAL) 100 UNIT/ML (KWIKPEN)
5.0000 [IU] | PEN_INJECTOR | Freq: Two times a day (BID) | SUBCUTANEOUS | 11 refills | Status: DC
  Filled 2021-09-27: qty 3, 30d supply, fill #0

## 2021-09-27 MED ORDER — INSULIN GLARGINE 100 UNIT/ML SOLOSTAR PEN
25.0000 [IU] | PEN_INJECTOR | Freq: Every day | SUBCUTANEOUS | 11 refills | Status: DC
  Filled 2021-09-27 – 2021-09-28 (×3): qty 9, 36d supply, fill #0

## 2021-09-27 NOTE — Assessment & Plan Note (Signed)
-  Continue high-dose atorvastatin 

## 2021-09-27 NOTE — Patient Instructions (Signed)
Complete set of labs obtained today  We will get you a medicaid dentist to evaluate for getting teeth pulled and dentures  No medication changes, please pick up at the pharmacy downstairs your insulin refills I just sent them both the lantus and humalog  Return Dr [name] 4 months

## 2021-09-27 NOTE — Assessment & Plan Note (Signed)
Patient's not currently using substances

## 2021-09-27 NOTE — Assessment & Plan Note (Signed)
Compensated heart failure he is currently off diuretic therapy continue current medications

## 2021-09-27 NOTE — Assessment & Plan Note (Signed)
Type 2 diabetes with polyneuropathy continue low-dose gabapentin continue insulin at current dose level I conferred with clinical pharmacy once patient moves into a more stable setting we will consider getting a Dexcom meter for him and endocrinology referral he also needs dental care

## 2021-09-27 NOTE — Progress Notes (Signed)
Established Patient Office Visit  Subjective:  Patient ID: Rodney Barber, male    DOB: 1966-01-11  Age: 56 y.o. MRN: [id number]  CC:  Chief Complaint  Patient presents with   Diabetes    HPI [name] presents for primary care follow-up.  Patient is currently at the [place] but is about to get housing and now has full Medicaid and Social Security disability.  Patient on arrival has blood pressure 110/54 blood sugar of 96.  He is taking 25 units of insulin glargine in the morning and variably takes 5 units of Humalog lispro in the morning and occasionally a dose at lunch.  He states his blood sugars are in the low 100s fasting midmorning 175 range and in the late afternoon he does get over 200 on occasion.  On arrival hemoglobin A1c is 9.1 which is still markedly better than previous values of greater than 14 in the past.  He has no other real complaints at this visit.  The patient needs screening labs at this visit he is still smoking some cigarettes.  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   Anemia    Diabetes mellitus    Hypertension    Pancreatitis    Substance use disorder 02/18/2019    Past Surgical History:  Procedure Laterality Date   ERCP  06/27/2011   Procedure: ENDOSCOPIC RETROGRADE CHOLANGIOPANCREATOGRAPHY (ERCP);  Surgeon: Jeryl Columbia, MD;  Location: [facility];  Service: Endoscopy;  Laterality: N/A;   ERCP W/ PLASTIC STENT PLACEMENT  03/2011   IR US GUIDE BX ASP/DRAIN  05/16/2019   LEFT HEART CATH AND CORONARY ANGIOGRAPHY N/A 01/02/2020   Procedure: LEFT HEART CATH AND CORONARY ANGIOGRAPHY;  Surgeon: Martinique, Peter M, MD;  Location: [facility];  Service: Cardiovascular;  Laterality: N/A;   TEE WITHOUT CARDIOVERSION N/A 05/20/2019   Procedure: TRANSESOPHAGEAL ECHOCARDIOGRAM (TEE);  Surgeon: [name], MD;  Location: [facility] END[facility]COPY;  Service: Cardiovascular;  Laterality: N/A;     Family History  Problem Relation Age of Onset   Diabetes Mother    Diabetes Brother     Social History   Socioeconomic History   Marital status: Single    Spouse name: Not on file   Number of children: Not on file   Years of education: Not on file   Highest education level: Not on file  Occupational History   Not on file  Tobacco Use   Smoking status: Every Day    Packs/day: 0.50    Years: 30.00    Pack years: 15.00    Types: Cigarettes   Smokeless tobacco: Never   Tobacco comments:    6 cigarett /day  Vaping Use   Vaping Use: Never used  Substance and Sexual Activity   Alcohol use: No   Drug use: Not Currently    Types: "Crack" cocaine, Other-see comments, Cocaine    Comment: last smoked crack 12-16-16   Sexual activity: Never  Other Topics Concern   Not on file  Social History Narrative   Not on file   Social Determinants of Health   Financial Resource Strain: Not on file  Food Insecurity: Not on file  Transportation Needs: Not on file  Physical Activity: Not on file  Stress: Not on file  Social Connections: Not on file  Intimate Partner Violence: Not on file    Outpatient Medications Prior to Visit  Medication Sig Dispense Refill   Accu-Chek Softclix Lancets lancets Ues  to check blood sugar 4 (four) times daily. 100 each 0   amLODipine (NORVASC) 5 MG tablet Take 1 tablet (5 mg total) by mouth daily. 30 tablet 11   aspirin 81 MG chewable tablet HOLD due to Hematuria 120 tablet 0   atorvastatin (LIPITOR) 80 MG tablet Take 1 tablet (80 mg total) by mouth daily at 6pm 60 tablet 0   Blood Glucose Monitoring Suppl (ACCU-CHEK GUIDE) w/Device KIT Use to check blood sugar four times daily. E11.65 1 kit 0   carvedilol (COREG) 25 MG tablet Take 1 tablet (25 mg total) by mouth 2 (two) times daily with a meal. 60 tablet 5   feeding supplement, GLUCERNA SHAKE, (GLUCERNA SHAKE) LIQD Take 237 mLs by mouth 3 (three) times daily between meals.  0   ferrous sulfate  (FEROSUL) 325 (65 FE) MG tablet Take 1 tablet (325 mg total) by mouth 2 (two) times daily with a meal. 60 tablet 3   gabapentin (NEURONTIN) 100 MG capsule Take 1 capsule (100 mg total) by mouth 3 (three) times daily. 90 capsule 0   Glucose 4-6 GM-MG CHEW Chew one as needed for blood glucose < 70 (Patient taking differently: Chew 1 tablet by mouth 2 (two) times daily as needed (low glucose). Chew one as needed for blood glucose < 70) 100 tablet 0   glucose blood (ACCU-CHEK GUIDE) test strip Use to check blood sugar four times daily. E11.65 100 each 12   ibuprofen (ADVIL) 200 MG tablet Take 400 mg by mouth every 6 (six) hours as needed for fever, headache or mild pain.     Insulin Pen Needle 31G X 5 MM MISC USE WITH INSULIN PENS 100 each 6   isosorbide mononitrate (IMDUR) 30 MG 24 hr tablet Take 1 tablet (30 mg total) by mouth daily. 90 tablet 3   nitroGLYCERIN (NITROSTAT) 0.4 MG SL tablet Place 1 tablet (0.4 mg total) under the tongue every 5 (five) minutes as needed for chest pain. 100 tablet 3   Pancrelipase, Lip-Prot-Amyl, 6000-19000 units CPEP Take 2 capsules (12,000 Units total) by mouth in the morning, at noon, and at bedtime. 360 capsule 0   pantoprazole (PROTONIX) 40 MG tablet Take 1 tablet (40 mg total) by mouth daily. 90 tablet 3   spironolactone (ALDACTONE) 25 MG tablet Take 1 tablet (25 mg total) by mouth daily. 90 tablet 3   triamcinolone cream (KENALOG) 0.1 % Apply 1 application topically 2 (two) times daily. 30 g 0   valsartan (DIOVAN) 320 MG tablet Take 1 tablet (320 mg total) by mouth daily. 90 tablet 3   insulin glargine (LANTUS) 100 UNIT/ML Solostar Pen Inject 25 Units into the skin daily. 9 mL 11   insulin lispro (HUMALOG) 100 UNIT/ML KwikPen Inject 5 Units into the skin 2 (two) times daily before a meal. 3 mL 11   Blood Glucose Monitoring Suppl (ACCU-CHEK GUIDE) w/Device KIT use as directed 4 times daily 1 kit 0   glucose blood (ACCU-CHEK GUIDE) test strip use as directed up to 4  times daily 100 each 0   HYDROcodone-acetaminophen (NORCO/VICODIN) 5-325 MG tablet Take 1 tablet by mouth every 6 (six) hours as needed. 10 tablet 0   Insulin Pen Needle 31G X 5 MM MISC USE WITH INSULIN PENS 100 each 6   Insulin Pen Needle 31G X 5 MM MISC USE WITH INSULIN PENS 100 each 6   ondansetron (ZOFRAN-ODT) 4 MG disintegrating tablet 40m ODT q4 hours prn nausea/vomit 10 tablet 0   No  facility-administered medications prior to visit.    No Known Allergies  ROS Review of Systems  Constitutional: Negative.   HENT: Negative.  Negative for ear pain, postnasal drip, rhinorrhea, sinus pressure, sore throat, trouble swallowing and voice change.   Eyes: Negative.   Respiratory: Negative.  Negative for apnea, cough, choking, chest tightness, shortness of breath, wheezing and stridor.   Cardiovascular: Negative.  Negative for chest pain, palpitations and leg swelling.  Gastrointestinal: Negative.  Negative for abdominal distention, abdominal pain, nausea and vomiting.  Genitourinary: Negative.   Musculoskeletal: Negative.  Negative for arthralgias and myalgias.  Skin: Negative.  Negative for rash.  Allergic/Immunologic: Negative.  Negative for environmental allergies and food allergies.  Neurological: Negative.  Negative for dizziness, syncope, weakness and headaches.  Hematological: Negative.  Negative for adenopathy. Does not bruise/bleed easily.  Psychiatric/Behavioral: Negative.  Negative for agitation and sleep disturbance. The patient is not nervous/anxious.      Objective:    Physical Exam Vitals reviewed.  Constitutional:      Appearance: Normal appearance. He is well-developed. He is not diaphoretic.  HENT:     Head: Normocephalic and atraumatic.     Nose: No nasal deformity, septal deviation, mucosal edema or rhinorrhea.     Right Sinus: No maxillary sinus tenderness or frontal sinus tenderness.     Left Sinus: No maxillary sinus tenderness or frontal sinus tenderness.      Mouth/Throat:     Pharynx: No oropharyngeal exudate.  Eyes:     General: No scleral icterus.    Conjunctiva/sclera: Conjunctivae normal.     Pupils: Pupils are equal, round, and reactive to light.  Neck:     Thyroid: No thyromegaly.     Vascular: No carotid bruit or JVD.     Trachea: Trachea normal. No tracheal tenderness or tracheal deviation.  Cardiovascular:     Rate and Rhythm: Normal rate and regular rhythm.     Chest Wall: PMI is not displaced.     Pulses: Normal pulses. No decreased pulses.     Heart sounds: Normal heart sounds, S1 normal and S2 normal. Heart sounds not distant. No murmur heard. No systolic murmur is present.  No diastolic murmur is present.    No friction rub. No gallop. No S3 or S4 sounds.  Pulmonary:     Effort: No tachypnea, accessory muscle usage or respiratory distress.     Breath sounds: No stridor. No decreased breath sounds, wheezing, rhonchi or rales.  Chest:     Chest wall: No tenderness.  Abdominal:     General: Bowel sounds are normal. There is no distension.     Palpations: Abdomen is soft. Abdomen is not rigid.     Tenderness: There is no abdominal tenderness. There is no guarding or rebound.  Musculoskeletal:        General: Normal range of motion.     Cervical back: Normal range of motion and neck supple. No edema, erythema or rigidity. No muscular tenderness. Normal range of motion.  Lymphadenopathy:     Head:     Right side of head: No submental or submandibular adenopathy.     Left side of head: No submental or submandibular adenopathy.     Cervical: No cervical adenopathy.  Skin:    General: Skin is warm and dry.     Coloration: Skin is not pale.     Findings: No rash.     Nails: There is no clubbing.  Neurological:     Mental Status:  He is alert and oriented to person, place, and time.     Sensory: No sensory deficit.  Psychiatric:        Speech: Speech normal.        Behavior: Behavior normal.    BP (!) 110/54    Pulse 66     Resp 16    Wt 145 lb (65.8 kg)    SpO2 97%    BMI 22.05 kg/m  Wt Readings from Last 3 Encounters:  09/27/21 145 lb (65.8 kg)  07/11/21 137 lb (62.1 kg)  06/07/21 138 lb 12.8 oz (63 kg)     Health Maintenance Due  Topic Date Due   Zoster Vaccines- Shingrix (1 of 2) Never done    There are no preventive care reminders to display for this patient.  Lab Results  Component Value Date   TSH 2.097 10/21/2019   Lab Results  Component Value Date   WBC 10.9 (H) 07/11/2021   HGB 10.0 (L) 07/11/2021   HCT 32.3 (L) 07/11/2021   MCV 95.8 07/11/2021   PLT 181 07/11/2021   Lab Results  Component Value Date   NA 139 07/11/2021   K 3.8 07/11/2021   CO2 20 (L) 07/11/2021   GLUCOSE 108 (H) 07/11/2021   BUN 20 07/11/2021   CREATININE 1.17 07/11/2021   BILITOT 0.8 07/11/2021   ALKPHOS 95 07/11/2021   AST 21 07/11/2021   ALT 25 07/11/2021   PROT 7.0 07/11/2021   ALBUMIN 3.9 07/11/2021   CALCIUM 8.9 07/11/2021   ANIONGAP 7 07/11/2021   EGFR 63 03/21/2021   Lab Results  Component Value Date   CHOL 93 (L) 03/21/2021   Lab Results  Component Value Date   HDL 31 (L) 03/21/2021   Lab Results  Component Value Date   LDLCALC 44 03/21/2021   Lab Results  Component Value Date   TRIG 89 03/21/2021   Lab Results  Component Value Date   CHOLHDL 3.0 03/21/2021   Lab Results  Component Value Date   HGBA1C 8.1 (A) 09/27/2021      Assessment & Plan:   Problem List Items Addressed This Visit       Cardiovascular and Mediastinum   Chronic combined systolic (congestive) and diastolic (congestive) heart failure ([place])    Compensated heart failure he is currently off diuretic therapy continue current medications      Essential hypertension    Blood pressure well controlled no change in medicines refills given      Relevant Orders   CBC with Differential/Platelet     Endocrine   Hyperlipidemia associated with type 2 diabetes mellitus (HCC) - Primary    Continue  high-dose atorvastatin      Relevant Medications   insulin glargine (LANTUS) 100 UNIT/ML Solostar Pen   insulin lispro (HUMALOG) 100 UNIT/ML KwikPen   Other Relevant Orders   HgB A1c (Completed)   Comprehensive metabolic panel   Lipid panel   Type 2 diabetes mellitus (HCC)    Type 2 diabetes with polyneuropathy continue low-dose gabapentin continue insulin at current dose level I conferred with clinical pharmacy once patient moves into a more stable setting we will consider getting a Dexcom meter for him and endocrinology referral he also needs dental care      Relevant Medications   insulin glargine (LANTUS) 100 UNIT/ML Solostar Pen   insulin lispro (HUMALOG) 100 UNIT/ML KwikPen     Other   Tobacco abuse       Current smoking consumption amount:  4 cigarettes daily  Dicsussion on advise to quit smoking and smoking impacts: Impact on cardiovascular health  Patient's willingness to quit: Willing to cut back but not quit  Methods to quit smoking discussed: Behavioral modification cannot use nicotine replacement therapy due to vascular disease  Medication management of smoking session drugs discussed: Not a candidate for medication management Chantix is on backorder  Resources provided:  AVS   Setting quit date not yet established  Follow-up arranged 2 months    Time spent counseling the patient: 5 minutes       RESOLVED: Substance use disorder    Patient's not currently using substances      Other Visit Diagnoses     Chronic combined systolic and diastolic heart failure (HCC)   (Chronic)     Chronic pancreatitis, unspecified pancreatitis type ([place])   (Chronic)         Meds ordered this encounter  Medications   insulin glargine (LANTUS) 100 UNIT/ML Solostar Pen    Sig: Inject 25 Units into the skin daily.    Dispense:  9 mL    Refill:  11    Fill now Has medicaid   insulin lispro (HUMALOG) 100 UNIT/ML KwikPen    Sig: Inject 5 Units into the skin 2 (two)  times daily before a meal.    Dispense:  3 mL    Refill:  11    Fill now  Has medicaid    Follow-up: Return in about 4 months (around 01/25/2022).    Asencion Noble, MD

## 2021-09-27 NOTE — Assessment & Plan Note (Signed)
Blood pressure well controlled no change in medicines refills given

## 2021-09-27 NOTE — Assessment & Plan Note (Addendum)
° ° °•   Current smoking consumption amount: 4 cigarettes daily  . Dicsussion on advise to quit smoking and smoking impacts: Impact on cardiovascular health  . Patient's willingness to quit: Willing to cut back but not quit  . Methods to quit smoking discussed: Behavioral modification cannot use nicotine replacement therapy due to vascular disease  . Medication management of smoking session drugs discussed: Not a candidate for medication management Chantix is on backorder  . Resources provided:  AVS   . Setting quit date not yet established  . Follow-up arranged 2 months    Time spent counseling the patient: 5 minutes  

## 2021-09-27 NOTE — Progress Notes (Unsigned)
Cardiology Office Note   Date:  09/27/2021   ID:  Rodney Barber, DOB 08-Feb-1966, MRN [id number]  PCP:  [name], MD  Cardiologist:  Jailani Hogans [place], MD EP: None  No chief complaint on file.     History of Present Illness: Rodney Barber is a 56 y.o. male with a PMH of nonobstructive CAD, chronic combined CHF/N ICM, HTN, HLD, DM type II, malnutrition, substance use disorder, tobacco abuse, and homelessness, who presents for 100-month follow-up.  His cardiomyopathy history dates back to 12/2019 when he was found to have EF 35-40% on echo.  NST at that time suggested a large defect concerning for prior MI with peri-infarct ischemia. He ultimately underwent underwent a LHC in 12/2019 which showed 70% mid RCA stenosis, 60% mid-distal LCx stenosis, and 30% proximal-mid LAD stenosis which was managed with medical therapy.  He was last evaluated by cardiology at an outpatient visit with Joni Reining, NP 03/2021 at which time he had complaints of fatigue, mild dizziness, and weakness in his legs.  He had no anginal complaints or volume overload complaints.  He reported compliance with his medications.  He was recommended to undergo repeat echocardiogram to reevaluate LV function.  This occurred 04/2021 and showed improvement in EF to 60 to 65%, no RWMA, G1DD, and no significant valvular dysfunction.  LE arterial dopplers in November 2022 looked good.   He presents today for close follow-up.  He reports improvement in his dizziness.  He continues to have some weakness/discomfort in his legs which occurs occur within 20-30 minutes into walking.  Walking is his primary mode of transportation and he reports missing the ability to walk longer distances.  He states his legs stay cold from the knees down and he has occasional tingling/pain in his feet.  He denies rubor on dependency or poorly healing wounds.  He otherwise reports rare twinges of chest discomfort which lasts for seconds at a time.   He denies any exertional chest pain, shortness of breath, DOE, palpitations, dizziness, lightheadedness, syncope, lower extremity edema, orthopnea, PND.  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   Anemia    Diabetes mellitus    Hypertension    Pancreatitis     Past Surgical History:  Procedure Laterality Date   ERCP  06/27/2011   Procedure: ENDOSCOPIC RETROGRADE CHOLANGIOPANCREATOGRAPHY (ERCP);  Surgeon: Petra Kuba, MD;  Location: [facility];  Service: Endoscopy;  Laterality: N/A;   ERCP W/ PLASTIC STENT PLACEMENT  03/2011   IR US GUIDE BX ASP/DRAIN  05/16/2019   LEFT HEART CATH AND CORONARY ANGIOGRAPHY N/A 01/02/2020   Procedure: LEFT HEART CATH AND CORONARY ANGIOGRAPHY;  Surgeon: [place], Tenisha Fleece M, MD;  Location: [facility] INVASIVE CV LAB;  Service: Cardiovascular;  Laterality: N/A;   TEE WITHOUT CARDIOVERSION N/A 05/20/2019   Procedure: TRANSESOPHAGEAL ECHOCARDIOGRAM (TEE);  Surgeon: Chrystie Nose, MD;  Location: [facility] ENDOSCOPY;  Service: Cardiovascular;  Laterality: N/A;     Current Outpatient Medications  Medication Sig Dispense Refill   Accu-Chek Softclix Lancets lancets Ues to check blood sugar 4 (four) times daily. 100 each 0   amLODipine (NORVASC) 5 MG tablet Take 1 tablet (5 mg total) by mouth daily. 30 tablet 11   aspirin 81 MG chewable tablet HOLD due to Hematuria 120 tablet 0   atorvastatin (LIPITOR) 80 MG tablet Take 1 tablet (80 mg total) by mouth daily at 6pm 60 tablet 0   Blood Glucose Monitoring Suppl (ACCU-CHEK GUIDE) w/Device  KIT use as directed 4 times daily 1 kit 0   Blood Glucose Monitoring Suppl (ACCU-CHEK GUIDE) w/Device KIT Use to check blood sugar four times daily. E11.65 1 kit 0   carvedilol (COREG) 25 MG tablet Take 1 tablet (25 mg total) by mouth 2 (two) times daily with a meal. 60 tablet 5   feeding supplement, GLUCERNA SHAKE, (GLUCERNA SHAKE) LIQD Take 237 mLs by mouth 3 (three) times daily between meals.   0   ferrous sulfate (FEROSUL) 325 (65 FE) MG tablet Take 1 tablet (325 mg total) by mouth 2 (two) times daily with a meal. 60 tablet 3   gabapentin (NEURONTIN) 100 MG capsule Take 1 capsule (100 mg total) by mouth 3 (three) times daily. 90 capsule 0   Glucose 4-6 GM-MG CHEW Chew one as needed for blood glucose < 70 (Patient taking differently: Chew 1 tablet by mouth 2 (two) times daily as needed (low glucose). Chew one as needed for blood glucose < 70) 100 tablet 0   glucose blood (ACCU-CHEK GUIDE) test strip use as directed up to 4 times daily 100 each 0   glucose blood (ACCU-CHEK GUIDE) test strip Use to check blood sugar four times daily. E11.65 100 each 12   HYDROcodone-acetaminophen (NORCO/VICODIN) 5-325 MG tablet Take 1 tablet by mouth every 6 (six) hours as needed. 10 tablet 0   ibuprofen (ADVIL) 200 MG tablet Take 400 mg by mouth every 6 (six) hours as needed for fever, headache or mild pain.     insulin glargine (LANTUS) 100 UNIT/ML Solostar Pen Inject 25 Units into the skin daily. 9 mL 11   insulin lispro (HUMALOG) 100 UNIT/ML KwikPen Inject 5 Units into the skin 2 (two) times daily before a meal. 3 mL 11   Insulin Pen Needle 31G X 5 MM MISC USE WITH INSULIN PENS 100 each 6   Insulin Pen Needle 31G X 5 MM MISC USE WITH INSULIN PENS 100 each 6   Insulin Pen Needle 31G X 5 MM MISC USE WITH INSULIN PENS 100 each 6   isosorbide mononitrate (IMDUR) 30 MG 24 hr tablet Take 1 tablet (30 mg total) by mouth daily. 90 tablet 3   nitroGLYCERIN (NITROSTAT) 0.4 MG SL tablet Place 1 tablet (0.4 mg total) under the tongue every 5 (five) minutes as needed for chest pain. 100 tablet 3   ondansetron (ZOFRAN-ODT) 4 MG disintegrating tablet 4mg  ODT q4 hours prn nausea/vomit 10 tablet 0   Pancrelipase, Lip-Prot-Amyl, 6000-19000 units CPEP Take 2 capsules (12,000 Units total) by mouth in the morning, at noon, and at bedtime. 360 capsule 0   pantoprazole (PROTONIX) 40 MG tablet Take 1 tablet (40 mg total) by  mouth daily. 90 tablet 3   spironolactone (ALDACTONE) 25 MG tablet Take 1 tablet (25 mg total) by mouth daily. 90 tablet 3   triamcinolone cream (KENALOG) 0.1 % Apply 1 application topically 2 (two) times daily. 30 g 0   valsartan (DIOVAN) 320 MG tablet Take 1 tablet (320 mg total) by mouth daily. 90 tablet 3   No current facility-administered medications for this visit.    Allergies:   Patient has no known allergies.    Social History:  The patient  reports that he has been smoking cigarettes. He has a 15.00 pack-year smoking history. He has never used smokeless tobacco. He reports that he does not currently use drugs after having used the following drugs: "Crack" cocaine, Other-see comments, and Cocaine. He reports that he does not drink  alcohol.   Family History:  The patient's family history includes Diabetes in his brother and mother.    ROS:  Please see the history of present illness.   Otherwise, review of systems are positive for none.   All other systems are reviewed and negative.    PHYSICAL EXAM: VS:  There were no vitals taken for this visit. , BMI There is no height or weight on file to calculate BMI. GEN: Well nourished, well developed, in no acute distress HEENT: Sclera anicteric Neck: no JVD, carotid bruits, or masses Cardiac: RRR; no murmurs, rubs, or gallops, trace-1+ L>R LE edema  Respiratory:  clear to auscultation bilaterally, normal work of breathing GI: soft, nontender, nondistended, + BS MS: no deformity or atrophy Skin: warm and dry, no rash Neuro:  Strength and sensation are intact Psych: euthymic mood, full affect   EKG:  EKG is not ordered today.   Recent Labs: 07/11/2021: ALT 25; BUN 20; Creatinine, Ser 1.17; Hemoglobin 10.0; Platelets 181; Potassium 3.8; Sodium 139    Lipid Panel    Component Value Date/Time   CHOL 93 (L) 03/21/2021 1038   TRIG 89 03/21/2021 1038   HDL 31 (L) 03/21/2021 1038   CHOLHDL 3.0 03/21/2021 1038   CHOLHDL 3.7  01/01/2020 0550   VLDL 20 01/01/2020 0550   LDLCALC 44 03/21/2021 1038      Wt Readings from Last 3 Encounters:  07/11/21 137 lb (62.1 kg)  06/07/21 138 lb 12.8 oz (63 kg)  03/21/21 137 lb 12.8 oz (62.5 kg)      Other studies Reviewed: Additional studies/ records that were reviewed today include:   LHC 12/2019: Prox LAD to Dist LAD lesion is 30% stenosed. Mid Cx to Dist Cx lesion is 60% stenosed. Mid RCA lesion is 70% stenosed. There is severe left ventricular systolic dysfunction. LV end diastolic pressure is mildly elevated. The left ventricular ejection fraction is 25-35% by visual estimate.   1. Diffuse coronary atherosclerosis with heavy calcification. 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 Severe LV dysfunction EF estimated at 25-30% 3. Elevated EDP 24 mm Hg   Plan: aggressive medical therapy for CHF. Will add aldactone to Coreg, lasix, and losartan. Titrate as BP allows. High dose statin therapy. LV dysfunction is out of proportion to the degree of CAD.  Diagnostic Dominance: Right   Echocardiogram 04/2021: 1. Left ventricular ejection fraction, by estimation, is 60 to 65%. The  left ventricle has normal function. The left ventricle has no regional  wall motion abnormalities. Left ventricular diastolic parameters are  consistent with Grade I diastolic  dysfunction (impaired relaxation).   2. Right ventricular systolic function is normal. The right ventricular  size is normal.   3. The mitral valve is abnormal. Trivial mitral valve regurgitation.   4. The aortic valve is tricuspid. Aortic valve regurgitation is not  visualized.   5. The inferior vena cava is normal in size with greater than 50%  respiratory variability, suggesting right atrial pressure of 3 mmHg.   Comparison(s): Changes from prior study are noted. 05/04/2020: LVEF 35-40%.   ASSESSMENT AND PLAN:   1.  Nonobstructive coronary artery disease: Has known diffuse,  predominantly moderate CAD (70% mid RCA, 60% mid-distal LCx, and 30% proximal-mid LAD stenosis) on cath in 12/2019.  He has no anginal complaints.  He tells me he has been off aspirin for a couple months due to hematuria. - We will reach out to PCP to determine barriers to restarting aspirin.  He has known kidney stone which was felt to be contributing to his hematuria and anemia - question need for urology evaluation. -Continue statin -Continue beta-blocker and Imdur -Continue aggressive risk factor modifications for goal BP <130/80, LDL <70, and A1c <7.  2.  Chronic combined CHF/NICM: EF as low as 35-40% on echo 12/2019 with recovery of LV function on last echo 04/2021 with EF 60-65%.  He has no volume overload complaints and appears euvolemic on exam today -Continue carvedilol, losartan, and spironolactone  3. HTN: BP 116/56 today - Managed in the context of #2  4. HLD: LDL 44 03/2021; at goal of <40 - Continue atorvastatin  5. DM type 2: A1C 8.7 02/2021 improved from 14.5 04/2020; goal <7 - Continue insulin per PCP - Would consider addition of SGLT2 inhibitor +/- Ozempic if not cost prohibitive.  6.  Claudication: He reports pain in bilateral legs . Dopplers in November without significant vascular obstruction.    Current medicines are reviewed at length with the patient today.  The patient does not have concerns regarding medicines.  The following changes have been made: As above  Labs/ tests ordered today include:   No orders of the defined types were placed in this encounter.    Disposition:   FU with Dr. [place] in 3 months  Signed, Harding Thomure [place], MD  09/27/2021 7:49 AM

## 2021-09-28 ENCOUNTER — Other Ambulatory Visit: Payer: Self-pay

## 2021-09-28 LAB — COMPREHENSIVE METABOLIC PANEL
ALT: 16 IU/L (ref 0–44)
AST: 13 IU/L (ref 0–40)
Albumin/Globulin Ratio: 1.7 (ref 1.2–2.2)
Albumin: 3.3 g/dL — ABNORMAL LOW (ref 3.8–4.9)
Alkaline Phosphatase: 90 IU/L (ref 44–121)
BUN/Creatinine Ratio: 15 (ref 9–20)
BUN: 23 mg/dL (ref 6–24)
Bilirubin Total: <0.2 mg/dL (ref 0.0–1.2)
CO2: 21 mmol/L (ref 20–29)
Calcium: 8.2 mg/dL — ABNORMAL LOW (ref 8.7–10.2)
Chloride: 114 mmol/L — ABNORMAL HIGH (ref 96–106)
Creatinine, Ser: 1.53 mg/dL — ABNORMAL HIGH (ref 0.76–1.27)
Globulin, Total: 2 g/dL (ref 1.5–4.5)
Glucose: 93 mg/dL (ref 70–99)
Potassium: 4.3 mmol/L (ref 3.5–5.2)
Sodium: 145 mmol/L — ABNORMAL HIGH (ref 134–144)
Total Protein: 5.3 g/dL — ABNORMAL LOW (ref 6.0–8.5)
eGFR: 53 mL/min/{1.73_m2} — ABNORMAL LOW (ref >59)

## 2021-09-28 LAB — CBC WITH DIFFERENTIAL/PLATELET
Basophils Absolute: 0.1 10*3/uL (ref 0.0–0.2)
Basos: 1 %
EOS (ABSOLUTE): 0.4 10*3/uL (ref 0.0–0.4)
Eos: 6 %
Hematocrit: 26.3 % — ABNORMAL LOW (ref 37.5–51.0)
Hemoglobin: 8.4 g/dL — ABNORMAL LOW (ref 13.0–17.7)
Immature Grans (Abs): 0 10*3/uL (ref 0.0–0.1)
Immature Granulocytes: 0 %
Lymphocytes Absolute: 1.6 10*3/uL (ref 0.7–3.1)
Lymphs: 22 %
MCH: 29.5 pg (ref 26.6–33.0)
MCHC: 31.9 g/dL (ref 31.5–35.7)
MCV: 92 fL (ref 79–97)
Monocytes Absolute: 0.6 10*3/uL (ref 0.1–0.9)
Monocytes: 8 %
Neutrophils Absolute: 4.7 10*3/uL (ref 1.4–7.0)
Neutrophils: 63 %
Platelets: 205 10*3/uL (ref 150–450)
RBC: 2.85 x10E6/uL — ABNORMAL LOW (ref 4.14–5.80)
RDW: 12.8 % (ref 11.6–15.4)
WBC: 7.4 10*3/uL (ref 3.4–10.8)

## 2021-09-28 LAB — LIPID PANEL
Chol/HDL Ratio: 2.8 ratio (ref 0.0–5.0)
Cholesterol, Total: 97 mg/dL — ABNORMAL LOW (ref 100–199)
HDL: 35 mg/dL — ABNORMAL LOW (ref >39)
LDL Chol Calc (NIH): 48 mg/dL (ref 0–99)
Triglycerides: 63 mg/dL (ref 0–149)
VLDL Cholesterol Cal: 14 mg/dL (ref 5–40)

## 2021-09-29 ENCOUNTER — Other Ambulatory Visit: Payer: Self-pay

## 2021-09-29 ENCOUNTER — Other Ambulatory Visit (HOSPITAL_COMMUNITY): Payer: Self-pay

## 2021-09-29 ENCOUNTER — Telehealth: Payer: Self-pay

## 2021-09-29 ENCOUNTER — Other Ambulatory Visit: Payer: Self-pay | Admitting: *Deleted

## 2021-09-29 MED ORDER — FERROUS SULFATE 325 (65 FE) MG PO TABS
325.0000 mg | ORAL_TABLET | Freq: Two times a day (BID) | ORAL | 3 refills | Status: DC
  Filled 2021-09-29: qty 60, 30d supply, fill #0

## 2021-09-29 MED ORDER — ATORVASTATIN CALCIUM 80 MG PO TABS
80.0000 mg | ORAL_TABLET | Freq: Every day | ORAL | 0 refills | Status: DC
  Filled 2021-09-29: qty 30, 30d supply, fill #0

## 2021-09-29 MED ORDER — CARVEDILOL 25 MG PO TABS
25.0000 mg | ORAL_TABLET | Freq: Two times a day (BID) | ORAL | 5 refills | Status: DC
  Filled 2021-09-29: qty 60, 30d supply, fill #0

## 2021-09-29 NOTE — Telephone Encounter (Signed)
Wasn't able to get a hold of pt. Information was sent to the nurse pool.

## 2021-09-29 NOTE — Telephone Encounter (Signed)
-----   Message from [name], MD sent at 09/29/2021  5:26 AM EST ----- Call pt and tell him kidney function stable.  Liver normal   he does have anemia will follow Cholesterol is at goal

## 2021-09-30 ENCOUNTER — Other Ambulatory Visit: Payer: Self-pay

## 2021-10-04 ENCOUNTER — Other Ambulatory Visit (HOSPITAL_COMMUNITY): Payer: Self-pay

## 2021-10-04 ENCOUNTER — Ambulatory Visit: Payer: Self-pay | Admitting: Cardiology

## 2021-10-05 ENCOUNTER — Encounter: Payer: Self-pay | Admitting: Physician Assistant

## 2021-10-05 ENCOUNTER — Other Ambulatory Visit: Payer: Self-pay | Admitting: Critical Care Medicine

## 2021-10-05 DIAGNOSIS — N200 Calculus of kidney: Secondary | ICD-10-CM

## 2021-10-05 NOTE — Progress Notes (Unsigned)
Pt seem by Dr [name]. ? ?C/o pain R arm and shoulder.  ? ?R arm gets tired, weak. These feelings come in spells, they happen w/ exertion. He can only work his R arm for 5-10" before sx happen.  ? ?Strength is equal in both arms, 2+ pulses. ? ?He is anemic now w/ H&H  8.4/26.3 ? ?155/72, 64,  CBG was 214 after breakfast. ? ?He has not heard anything about housing.  ? ?Theodore Demark, PA-C ?10/05/2021 ?3:26 PM ? ? ? ? ?

## 2021-10-06 ENCOUNTER — Other Ambulatory Visit (HOSPITAL_COMMUNITY): Payer: Self-pay

## 2021-10-06 ENCOUNTER — Other Ambulatory Visit: Payer: Self-pay | Admitting: *Deleted

## 2021-10-06 MED ORDER — PANTOPRAZOLE SODIUM 40 MG PO TBEC
40.0000 mg | DELAYED_RELEASE_TABLET | Freq: Every day | ORAL | 3 refills | Status: DC
  Filled 2021-10-06: qty 90, 90d supply, fill #0

## 2021-10-06 MED ORDER — ISOSORBIDE MONONITRATE ER 30 MG PO TB24
30.0000 mg | ORAL_TABLET | Freq: Every day | ORAL | 3 refills | Status: DC
  Filled 2021-10-06: qty 90, 90d supply, fill #0

## 2021-10-06 MED ORDER — GABAPENTIN 100 MG PO CAPS
100.0000 mg | ORAL_CAPSULE | Freq: Three times a day (TID) | ORAL | 0 refills | Status: DC
  Filled 2021-10-06: qty 90, 30d supply, fill #0

## 2021-10-06 MED ORDER — FERROUS SULFATE 325 (65 FE) MG PO TABS
325.0000 mg | ORAL_TABLET | Freq: Two times a day (BID) | ORAL | 3 refills | Status: DC
Start: 2021-10-06 — End: 2021-10-06
  Filled 2021-10-06: qty 180, 90d supply, fill #0

## 2021-10-06 MED ORDER — FERROUS SULFATE 325 (65 FE) MG PO TABS
325.0000 mg | ORAL_TABLET | Freq: Two times a day (BID) | ORAL | 3 refills | Status: DC
  Filled 2021-10-06: qty 180, 90d supply, fill #0

## 2021-10-07 ENCOUNTER — Other Ambulatory Visit: Payer: Self-pay

## 2021-10-10 ENCOUNTER — Other Ambulatory Visit: Payer: Self-pay

## 2021-10-13 ENCOUNTER — Other Ambulatory Visit: Payer: Self-pay

## 2021-10-13 ENCOUNTER — Other Ambulatory Visit: Payer: Self-pay | Admitting: *Deleted

## 2021-10-13 MED ORDER — AMLODIPINE BESYLATE 5 MG PO TABS
5.0000 mg | ORAL_TABLET | Freq: Every day | ORAL | 11 refills | Status: DC
  Filled 2021-10-13: qty 30, 30d supply, fill #0

## 2021-10-14 ENCOUNTER — Other Ambulatory Visit: Payer: Self-pay

## 2021-10-17 ENCOUNTER — Emergency Department (HOSPITAL_COMMUNITY)
Admission: EM | Admit: 2021-10-17 | Discharge: 2021-10-18 | Disposition: A | Discharge status: Home / Self Care | Payer: Medicaid Other | Attending: Emergency Medicine | Admitting: Emergency Medicine

## 2021-10-17 ENCOUNTER — Encounter (HOSPITAL_COMMUNITY): Payer: Self-pay | Admitting: Emergency Medicine

## 2021-10-17 ENCOUNTER — Other Ambulatory Visit: Payer: Self-pay

## 2021-10-17 ENCOUNTER — Emergency Department (HOSPITAL_COMMUNITY): Payer: Medicaid Other

## 2021-10-17 DIAGNOSIS — R21 Rash and other nonspecific skin eruption: Secondary | ICD-10-CM | POA: Diagnosis present

## 2021-10-17 DIAGNOSIS — I10 Essential (primary) hypertension: Secondary | ICD-10-CM | POA: Diagnosis not present

## 2021-10-17 DIAGNOSIS — E119 Type 2 diabetes mellitus without complications: Secondary | ICD-10-CM | POA: Insufficient documentation

## 2021-10-17 DIAGNOSIS — L509 Urticaria, unspecified: Secondary | ICD-10-CM | POA: Insufficient documentation

## 2021-10-17 DIAGNOSIS — Z794 Long term (current) use of insulin: Secondary | ICD-10-CM | POA: Insufficient documentation

## 2021-10-17 DIAGNOSIS — N5089 Other specified disorders of the male genital organs: Secondary | ICD-10-CM | POA: Diagnosis not present

## 2021-10-17 DIAGNOSIS — Z79899 Other long term (current) drug therapy: Secondary | ICD-10-CM | POA: Diagnosis not present

## 2021-10-17 DIAGNOSIS — Z7982 Long term (current) use of aspirin: Secondary | ICD-10-CM | POA: Insufficient documentation

## 2021-10-17 NOTE — ED Triage Notes (Signed)
Patient reports generalized itchy skin rashes for several days , denies fever , respirations unlabored/airway intact .  ?

## 2021-10-17 NOTE — ED Provider Triage Note (Signed)
Emergency Medicine Provider Triage Evaluation Note ? ?Rodney Barber , a 56 y.o. male  was evaluated in triage.  Pt complains of generalized itchy rash which has been ongoing for the last week.  Denies any known allergies.  No recent tick exposures.  No fevers.  No recent new shampoos/lotions.  He states that he did take some kind of cream for this a while back but does not remember the name.  He also complains of scrotal swelling.  No difficulty /pain urinating though he does state that he does take intermittent pain.  Swelling started about 2 days ago.  He denies any chest pain, shortness of breath. ? ?Review of Systems  ?Positive: As above ?Negative: As above ? ?Physical Exam  ?BP (!) 173/83 (BP Location: Right Arm)   Pulse 70   Temp 97.9 ?F (36.6 ?C) (Oral)   Resp 16   SpO2 100%  ?Gen:   Awake, no distress   ?Resp:  Normal effort  ?MSK:   Moves extremities without difficulty  ?Other:  Diffuse scaly rash with raised papules, nonerythematous, with scaly patches on the back.  Uvula is not swollen, no tripoding, speaking full sentences without stridor or increased work of breathing.  Scrotal exam performed with RN, diffuse  moderate swelling bilaterally, with mild epididymal tenderness.  No visible penile/lesions. ? ?Medical Decision Making  ?Medically screening exam initiated at 11:45 PM.  Appropriate orders placed.  Rodney Barber was informed that the remainder of the evaluation will be completed by another provider, this initial triage assessment does not replace that evaluation, and the importance of remaining in the ED until their evaluation is complete. ? ?Will order  scrotal US to rule out torsion/epidimitis ? ?  ?Garald Balding, PA-C ?10/17/21 2349 ? ?

## 2021-10-18 ENCOUNTER — Other Ambulatory Visit (HOSPITAL_COMMUNITY): Payer: Self-pay

## 2021-10-18 ENCOUNTER — Other Ambulatory Visit: Payer: Self-pay | Admitting: Critical Care Medicine

## 2021-10-18 LAB — URINALYSIS, ROUTINE W REFLEX MICROSCOPIC
Bilirubin Urine: NEGATIVE
Glucose, UA: >=500 mg/dL — AB
Ketones, ur: NEGATIVE mg/dL
Leukocytes,Ua: NEGATIVE
Nitrite: NEGATIVE
Protein, ur: 100 mg/dL — AB
Specific Gravity, Urine: 1.011 (ref 1.005–1.030)
pH: 6 (ref 5.0–8.0)

## 2021-10-18 LAB — GC/CHLAMYDIA PROBE AMP (~~LOC~~) NOT AT ARMC
Chlamydia: NEGATIVE
Comment: NEGATIVE
Comment: NORMAL
Neisseria Gonorrhea: NEGATIVE

## 2021-10-18 MED ORDER — PREDNISONE 10 MG PO TABS: 20.0000 mg | ORAL_TABLET | Freq: Two times a day (BID) | ORAL | 0 refills | Status: DC

## 2021-10-18 MED ORDER — HYDROXYZINE HCL 25 MG PO TABS: 25.0000 mg | ORAL_TABLET | Freq: Four times a day (QID) | ORAL | 0 refills | Status: DC | PRN

## 2021-10-18 MED ORDER — CIPROFLOXACIN HCL 500 MG PO TABS
500.0000 mg | ORAL_TABLET | Freq: Once | ORAL | Status: AC
  Administered 2021-10-18: 500 mg via ORAL
  Filled 2021-10-18: qty 1

## 2021-10-18 MED ORDER — HYDROXYZINE HCL 25 MG PO TABS
25.0000 mg | ORAL_TABLET | Freq: Once | ORAL | Status: AC
  Administered 2021-10-18: 25 mg via ORAL
  Filled 2021-10-18: qty 1

## 2021-10-18 MED ORDER — CEFDINIR 300 MG PO CAPS
300.0000 mg | ORAL_CAPSULE | Freq: Two times a day (BID) | ORAL | 0 refills | Status: AC
Start: 2021-10-18 — End: 2021-10-25
  Filled 2021-10-18 (×2): qty 14, 7d supply, fill #0

## 2021-10-18 MED ORDER — CIPROFLOXACIN HCL 500 MG PO TABS: 500.0000 mg | ORAL_TABLET | Freq: Two times a day (BID) | ORAL | 0 refills | Status: DC

## 2021-10-18 MED ORDER — PREDNISONE 10 MG PO TABS
20.0000 mg | ORAL_TABLET | Freq: Two times a day (BID) | ORAL | 0 refills | Status: DC
Start: 2021-10-18 — End: 2021-12-03
  Filled 2021-10-18 (×2): qty 20, 5d supply, fill #0

## 2021-10-18 MED ORDER — HYDROXYZINE HCL 25 MG PO TABS
25.0000 mg | ORAL_TABLET | Freq: Four times a day (QID) | ORAL | 0 refills | Status: DC | PRN
  Filled 2021-10-18 (×2): qty 15, 4d supply, fill #0

## 2021-10-18 MED ORDER — PREDNISONE 20 MG PO TABS
40.0000 mg | ORAL_TABLET | Freq: Once | ORAL | Status: AC
  Administered 2021-10-18: 40 mg via ORAL
  Filled 2021-10-18: qty 2

## 2021-10-18 NOTE — Discharge Instructions (Signed)
Begin taking Cipro as prescribed. ? ?Begin taking prednisone and hydroxyzine as prescribed. ? ?Follow-up with your primary doctor if symptoms are not improving in the next week, and return to the ER if you develop any new and/or worsening symptoms. ?

## 2021-10-18 NOTE — ED Provider Notes (Signed)
?[facility] ?Provider Note ? ? ?CSN: [id number] ?Arrival date & time: 10/17/21  2322 ? ?  ? ?History ? ?No chief complaint on file. ? ? ?Rodney Barber is a 56 y.o. male. ? ?Patient is a 56 year old male with history of hypertension, diabetes.  Patient presenting today for evaluation of rash.  He describes a raised, blotchy, itchy rash noted to his torso and arms and legs.  This started 2 days ago.  He has not tried any over-the-counter medications.  He denies any new contacts or exposures.  He denies any throat swelling, fevers, or chills. ? ?Patient also describes swelling to his testicles and scrotum.  This has been worsening over the past several days.  He denies any difficulty urinating.  He denies any new sexual partners or contacts. ? ?The history is provided by the patient.  ? ?  ? ?Home Medications ?Prior to Admission medications   ?Medication Sig Start Date End Date Taking? Authorizing Provider  ?Accu-Chek Softclix Lancets lancets Ues to check blood sugar 4 (four) times daily. 09/14/21   Elsie Stain, MD  ?amLODipine (NORVASC) 5 MG tablet Take 1 tablet (5 mg total) by mouth daily. 10/13/21   Elsie Stain, MD  ?aspirin 81 MG chewable tablet HOLD due to Hematuria 05/24/21   Elsie Stain, MD  ?atorvastatin (LIPITOR) 80 MG tablet Take 1 tablet (80 mg total) by mouth daily at 6pm 09/29/21   Elsie Stain, MD  ?Blood Glucose Monitoring Suppl (ACCU-CHEK GUIDE) w/Device KIT Use to check blood sugar four times daily. E11.65 09/14/21   Elsie Stain, MD  ?carvedilol (COREG) 25 MG tablet Take 1 tablet (25 mg total) by mouth 2 (two) times daily with a meal. 09/29/21   Elsie Stain, MD  ?feeding supplement, GLUCERNA SHAKE, (GLUCERNA SHAKE) LIQD Take 237 mLs by mouth 3 (three) times daily between meals. 09/11/19   Mariel Aloe, MD  ?ferrous sulfate (FEROSUL) 325 (65 FE) MG tablet Take 1 tablet (325 mg total) by mouth 2 (two) times daily with a meal. 10/06/21    Elsie Stain, MD  ?gabapentin (NEURONTIN) 100 MG capsule Take 1 capsule (100 mg total) by mouth 3 (three) times daily. 10/06/21   Elsie Stain, MD  ?Glucose 4-6 GM-MG CHEW Chew one as needed for blood glucose < 70 ?Patient taking differently: Chew 1 tablet by mouth 2 (two) times daily as needed (low glucose). Chew one as needed for blood glucose < 70 05/06/20   Elsie Stain, MD  ?glucose blood (ACCU-CHEK GUIDE) test strip Use to check blood sugar four times daily. E11.65 09/14/21   Elsie Stain, MD  ?ibuprofen (ADVIL) 200 MG tablet Take 400 mg by mouth every 6 (six) hours as needed for fever, headache or mild pain.    [provider]  ?insulin glargine (LANTUS) 100 UNIT/ML Solostar Pen Inject 25 Units into the skin daily. 09/27/21   Elsie Stain, MD  ?insulin lispro (HUMALOG) 100 UNIT/ML KwikPen Inject 5 Units into the skin 2 (two) times daily before a meal. 09/27/21   Elsie Stain, MD  ?Insulin Pen Needle 31G X 5 MM MISC USE WITH INSULIN PENS 09/01/21   Elsie Stain, MD  ?isosorbide mononitrate (IMDUR) 30 MG 24 hr tablet Take 1 tablet (30 mg total) by mouth daily. 10/06/21   Elsie Stain, MD  ?nitroGLYCERIN (NITROSTAT) 0.4 MG SL tablet Place 1 tablet (0.4 mg total) under the tongue every 5 (  five) minutes as needed for chest pain. 12/15/20   Elsie Stain, MD  ?Pancrelipase, Lip-Prot-Amyl, 6000-19000 units CPEP Take 2 capsules (12,000 Units total) by mouth in the morning, at noon, and at bedtime. 08/11/21   Elsie Stain, MD  ?pantoprazole (PROTONIX) 40 MG tablet Take 1 tablet (40 mg total) by mouth daily. 10/06/21   Elsie Stain, MD  ?spironolactone (ALDACTONE) 25 MG tablet Take 1 tablet (25 mg total) by mouth daily. 09/15/21   Elsie Stain, MD  ?triamcinolone cream (KENALOG) 0.1 % Apply 1 application topically 2 (two) times daily. 03/30/21   Elsie Stain, MD  ?valsartan (DIOVAN) 320 MG tablet Take 1 tablet (320 mg total) by mouth daily. 09/14/21   Elsie Stain, MD  ?Ferrous Sulfate Dried 200 (65 Fe) MG TABS TAKE 200 MG BY MOUTH 2 (TWO) TIMES DAILY WITH A MEAL. 02/02/21 04/20/21  Elsie Stain, MD  ?   ? ?Allergies    ?Patient has no known allergies.   ? ?Review of Systems   ?Review of Systems  ?All other systems reviewed and are negative. ? ?Physical Exam ?Updated Vital Signs ?BP (!) 173/83 (BP Location: Right Arm)   Pulse 70   Temp 97.9 ?F (36.6 ?C) (Oral)   Resp 16   SpO2 100%  ?Physical Exam ?Vitals and nursing note reviewed.  ?Constitutional:   ?   General: He is not in acute distress. ?   Appearance: He is well-developed. He is not diaphoretic.  ?HENT:  ?   Head: Normocephalic and atraumatic.  ?Cardiovascular:  ?   Rate and Rhythm: Normal rate and regular rhythm.  ?   Heart sounds: No murmur heard. ?  No friction rub.  ?Pulmonary:  ?   Effort: Pulmonary effort is normal. No respiratory distress.  ?   Breath sounds: Normal breath sounds. No wheezing or rales.  ?Abdominal:  ?   General: Bowel sounds are normal. There is no distension.  ?   Palpations: Abdomen is soft.  ?   Tenderness: There is no abdominal tenderness.  ?Genitourinary: ?   Penis: Normal.   ?   Comments: There is some swelling and thickness of the scrotum, but no warmth or crepitus.  Testicles are freely mobile within the scrotum. ?Musculoskeletal:     ?   General: Normal range of motion.  ?   Cervical back: Normal range of motion and neck supple.  ?Skin: ?   General: Skin is warm and dry.  ?   Comments: There is an urticarial rash noted to the abdomen and extremities. ? ?  ?Neurological:  ?   Mental Status: He is alert and oriented to person, place, and time.  ?   Coordination: Coordination normal.  ? ? ?ED Results / Procedures / Treatments   ?Labs ?(all labs ordered are listed, but only abnormal results are displayed) ?Labs Reviewed  ?URINALYSIS, ROUTINE W REFLEX MICROSCOPIC - Abnormal; Notable for the following components:  ?    Result Value  ? Color, Urine STRAW (*)   ? Glucose, UA >=500 (*)    ? Hgb urine dipstick MODERATE (*)   ? Protein, ur 100 (*)   ? Bacteria, UA RARE (*)   ? All other components within normal limits  ?GC/CHLAMYDIA PROBE AMP () NOT AT [facility]  ? ? ?EKG ?None ? ?Radiology ?US SCROTUM W/DOPPLER ? ?Result Date: 10/18/2021 ?CLINICAL DATA:  Scrotal swelling. EXAM: SCROTAL ULTRASOUND DOPPLER ULTRASOUND OF THE TESTICLES TECHNIQUE: Complete ultrasound  examination of the testicles, epididymis, and other scrotal structures was performed. Color and spectral Doppler ultrasound were also utilized to evaluate blood flow to the testicles. COMPARISON:  07/11/2021. FINDINGS: Right testicle Measurements: 3.6 x 2.2 x 2.8 cm. No mass or microlithiasis visualized. Left testicle Measurements: 3.5 x 2.3 x 1.8 cm. No mass or microlithiasis visualized. Right epididymis:  Normal in size and appearance. Left epididymis:  Normal in size and appearance. Hydrocele: There is a moderate to large right hydrocele with internal echogenicities. A small left hydrocele is noted. Varicocele:  None visualized. Pulsed Doppler interrogation of both testes demonstrates normal low resistance arterial and venous waveforms bilaterally. Scrotal wall thickening is noted bilaterally, greater on the left than on the right. IMPRESSION: 1. No evidence of testicular torsion. 2. Moderate to large right hydrocele and small left hydrocele. 3. Diffuse scrotal wall thickening bilaterally, which may be infectious or inflammatory. Electronically Signed   By: Brett Fairy M.D.   On: 10/18/2021 00:42   ? ?Procedures ?Procedures  ? ? ?Medications Ordered in ED ?Medications  ?predniSONE (DELTASONE) tablet 40 mg (has no administration in time range)  ?ciprofloxacin (CIPRO) tablet 500 mg (has no administration in time range)  ?hydrOXYzine (ATARAX) tablet 25 mg (has no administration in time range)  ? ? ?ED Course/ Medical Decision Making/ A&P ?  ?Patient presenting with 2 complaints.  He appears to have hives.  This will be treated with  prednisone and hydroxyzine.  The cause of the hives is unknown. ? ?Patient also with testicular and scrotal swelling.  He does appear to have hydroceles bilaterally along with thickening of the scrotal wall

## 2021-10-19 ENCOUNTER — Other Ambulatory Visit: Payer: Self-pay | Admitting: Family Medicine

## 2021-10-19 ENCOUNTER — Encounter: Payer: Self-pay | Admitting: Family Medicine

## 2021-10-19 ENCOUNTER — Other Ambulatory Visit (HOSPITAL_COMMUNITY): Payer: Self-pay

## 2021-10-19 MED ORDER — TRIAMCINOLONE ACETONIDE 0.1 % EX CREA
1.0000 "application " | TOPICAL_CREAM | Freq: Two times a day (BID) | CUTANEOUS | 0 refills | Status: DC
  Filled 2021-10-19: qty 30, 7d supply, fill #0

## 2021-10-19 MED ORDER — CETIRIZINE HCL 10 MG PO TABS
10.0000 mg | ORAL_TABLET | Freq: Every day | ORAL | 11 refills | Status: DC
  Filled 2021-10-19: qty 30, 30d supply, fill #0

## 2021-10-19 NOTE — Progress Notes (Addendum)
[facility] ? ?56 yo with hx T2DM, CAD, HTN, HF, HLD following up from ED visit yesterday where he was seen for an urticarial rash and scrotal swelling.  He was discharged with steroids/hydroxyzine for the rash and cipro for his scrotal swelling to treat epididymitis vs cellulitis.  Today he notes he's better from pruritus standpoint.  Has had rash for Keonia Pasko year, but worse within past few days.  Itching better on steroids, but sugars in 400's this morning.   ? ?Vitals 71, 99% on RA, 145/66 ?NAD, sleepy ?Several eczematous patches on torso/axilla, scattered papules (see pictures in media).  Webspaces of fingers without lesions. ?GU exam with swelling to scrotum, nontender, no significant rash noted to groin, ? Mild erythema to scrotum ?Bilateral LE edema ? ?Kialee Kham/P ?BG's high on steroids, will stop oral steroids and start triamcinolone BID x7 days to rashes on torso.  Also add zyrtec daily, continue hydroxyzine prn.  ?contact dermatitis, unclear.  Given his report on rash x1 year, would be reasonable to see if he can get derm follow up. ?Continue cipro as prescribed for scrotal swelling to cover epididymitis vs cellulitis ?Bilateral LE swelling, considered restarting lasix, last creatinine was up Librado Guandique bit, will see if we can get repeat creatinine prior to starting lasix.  ?

## 2021-10-20 ENCOUNTER — Other Ambulatory Visit: Payer: Self-pay

## 2021-10-20 ENCOUNTER — Other Ambulatory Visit (HOSPITAL_COMMUNITY): Payer: Self-pay

## 2021-10-20 ENCOUNTER — Other Ambulatory Visit: Payer: Self-pay | Admitting: *Deleted

## 2021-10-20 MED ORDER — SPIRONOLACTONE 25 MG PO TABS
25.0000 mg | ORAL_TABLET | Freq: Every day | ORAL | 3 refills | Status: DC
  Filled 2021-10-20: qty 90, 90d supply, fill #0

## 2021-10-20 MED ORDER — AMLODIPINE BESYLATE 5 MG PO TABS
5.0000 mg | ORAL_TABLET | Freq: Every day | ORAL | 11 refills | Status: DC
  Filled 2021-10-20: qty 30, 30d supply, fill #0

## 2021-10-21 ENCOUNTER — Other Ambulatory Visit: Payer: Self-pay

## 2021-10-24 NOTE — Progress Notes (Incomplete)
? ?10/24/21 ?1:21 PM  ? ?Rodney Barber ?November 10, 1965 ?[id number] ? ?Referring provider:  ?Elsie Stain, MD ?[address] ?[address] ?[place],  [place] [address] ?No chief complaint on file. ? ? ? ?HPI: ?Rodney Barber is a 56 y.o.male who presents today for further evaluation of 18 mm left calculus, hydrocele, and hydronephrosis.  ? ?He has a personal history of kidney stones.  ? ?He was seen in the ED on 07/11/2021 for abdominal pain and back pain CBC and CMP unremarkable.  Urinalysis showed some blood. CT abdomen and pelvis visualized a 18 mm calculus in left renal pelvis and also left-sided hydronephrosis. He was referred to Urology.  ? ?He was seen in the ED recently on 10/17/2021 for evaluation of rash and swelling to his testicles and scrotum. Urinalysis showed moderate Hgb and rare bacteria. Scrotal ultrasound revealed hydroceles bilaterally along with thickening of the scrotal wall consistent with inflammatory or infectious process. He was treated with Ciprofloxacin for presumed epididymitis/cellulitis. STD testing was negative.  ? ? ? ? ? ? ?PMH: ?Past Medical History:  ?Diagnosis Date  ? 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 Anemia   ? Diabetes mellitus   ? Hypertension   ? Pancreatitis   ? Substance use disorder 02/18/2019  ? ? ?Surgical History: ?Past Surgical History:  ?Procedure Laterality Date  ? ERCP  06/27/2011  ? Procedure: ENDOSCOPIC RETROGRADE CHOLANGIOPANCREATOGRAPHY (ERCP);  Surgeon: Jeryl Columbia, MD;  Location: [facility];  Service: Endoscopy;  Laterality: N/A;  ? ERCP W/ PLASTIC STENT PLACEMENT  03/2011  ? IR US GUIDE BX ASP/DRAIN  05/16/2019  ? LEFT HEART CATH AND CORONARY ANGIOGRAPHY N/A 01/02/2020  ? Procedure: LEFT HEART CATH AND CORONARY ANGIOGRAPHY;  Surgeon: Martinique, Peter M, MD;  Location: [facility];  Service: Cardiovascular;  Laterality: N/A;  ? TEE WITHOUT CARDIOVERSION N/A 05/20/2019  ? Procedure: TRANSESOPHAGEAL ECHOCARDIOGRAM (TEE);  Surgeon: [name], MD;  Location: [facility];  Service: Cardiovascular;  Laterality: N/A;  ? ? ?Home Medications:  ?Allergies as of 10/25/2021   ?No Known Allergies ?  ? ?  ?Medication List  ?  ? ?  ? Accurate as of October 24, 2021  1:21 PM. If you have any questions, ask your nurse or doctor.  ?  ?  ? ?  ? ?Accu-Chek Guide test strip ?Generic drug: glucose blood ?Use to check blood sugar four times daily. E11.65 ?  ?Accu-Chek Guide w/Device Kit ?Use to check blood sugar four times daily. E11.65 ?  ?Accu-Chek Softclix Lancets lancets ?Ues to check blood sugar 4 (four) times daily. ?  ?amLODipine 5 MG tablet ?Commonly known as: NORVASC ?Take 1 tablet (5 mg total) by mouth daily. ?  ?aspirin 81 MG chewable tablet ?HOLD due to Hematuria ?  ?atorvastatin 80 MG tablet ?Commonly known as: LIPITOR ?Take 1 tablet (80 mg total) by mouth daily at 6pm ?  ?carvedilol 25 MG tablet ?Commonly known as: COREG ?Take 1 tablet (25 mg total) by mouth 2 (two) times daily with a meal. ?  ?cefdinir 300 MG capsule ?Commonly known as: OMNICEF ?Take 1 capsule (300 mg total) by mouth 2 (two) times daily for 7 days. ?  ?cetirizine 10 MG tablet ?Commonly known as: ZYRTEC ?Take 1 tablet (10 mg total) by mouth daily. ?  ?Creon 6000-19000 units Cpep ?Generic drug: Pancrelipase (Lip-Prot-Amyl) ?Take 2 capsules (12,000 Units total) by mouth in the morning, at noon, and at bedtime. ?  ?feeding supplement (GLUCERNA SHAKE) Liqd ?Take  237 mLs by mouth 3 (three) times daily between meals. ?  ?ferrous sulfate 325 (65 FE) MG tablet ?Commonly known as: FeroSul ?Take 1 tablet (325 mg total) by mouth 2 (two) times daily with a meal. ?  ?gabapentin 100 MG capsule ?Commonly known as: Neurontin ?Take 1 capsule (100 mg total) by mouth 3 (three) times daily. ?  ?Glucose 4-6 GM-MG Chew ?Chew one as needed for blood glucose < 70 ?What changed:  ?how much to take ?how to take this ?when to take this ?reasons to take this ?  ?HumaLOG KwikPen 100 UNIT/ML KwikPen ?Generic drug:  insulin lispro ?Inject 5 Units into the skin 2 (two) times daily before a meal. ?  ?hydrOXYzine 25 MG tablet ?Commonly known as: ATARAX ?Take 1 tablet (25 mg total) by mouth every 6 (six) hours as needed. ?  ?ibuprofen 200 MG tablet ?Commonly known as: ADVIL ?Take 400 mg by mouth every 6 (six) hours as needed for fever, headache or mild pain. ?  ?Insulin Pen Needle 31G X 5 MM Misc ?USE WITH INSULIN PENS ?  ?isosorbide mononitrate 30 MG 24 hr tablet ?Commonly known as: IMDUR ?Take 1 tablet (30 mg total) by mouth daily. ?  ?Lantus SoloStar 100 UNIT/ML Solostar Pen ?Generic drug: insulin glargine ?Inject 25 Units into the skin daily. ?  ?nitroGLYCERIN 0.4 MG SL tablet ?Commonly known as: NITROSTAT ?Place 1 tablet (0.4 mg total) under the tongue every 5 (five) minutes as needed for chest pain. ?  ?pantoprazole 40 MG tablet ?Commonly known as: PROTONIX ?Take 1 tablet (40 mg total) by mouth daily. ?  ?predniSONE 10 MG tablet ?Commonly known as: DELTASONE ?Take 2 tablets (20 mg total) by mouth 2 (two) times daily with a meal. ?  ?spironolactone 25 MG tablet ?Commonly known as: ALDACTONE ?Take 1 tablet (25 mg total) by mouth daily. ?  ?triamcinolone cream 0.1 % ?Commonly known as: KENALOG ?Apply 1 application. topically 2 (two) times daily for 7 days. Use to rash on chest and back.  Do not use on face or groin or in skin folds. ?  ?valsartan 320 MG tablet ?Commonly known as: DIOVAN ?Take 1 tablet (320 mg total) by mouth daily. ?  ? ?  ? ? ?Allergies: No Known Allergies ? ?Family History: ?Family History  ?Problem Relation Age of Onset  ? Diabetes Mother   ? Diabetes Brother   ? ? ?Social History:  reports that he has been smoking cigarettes. He has a 15.00 pack-year smoking history. He has never used smokeless tobacco. He reports that he does not currently use drugs after having used the following drugs: "Crack" cocaine, Other-see comments, and Cocaine. He reports that he does not drink alcohol. ? ? ?Physical Exam: ?There  were no vitals taken for this visit.  ?Constitutional:  Alert and oriented, No acute distress. ?HEENT: [place] AT, moist mucus membranes.  Trachea midline, no masses. ?Cardiovascular: No clubbing, cyanosis, or edema. ?Respiratory: Normal respiratory effort, no increased work of breathing. ?GI: Abdomen is soft, nontender, nondistended, no abdominal masses ?GU: No CVA tenderness ?Lymph: No cervical or inguinal lymphadenopathy. ?Skin: No rashes, bruises or suspicious lesions. ?Neurologic: Grossly intact, no focal deficits, moving all 4 extremities. ?Psychiatric: Normal mood and affect. ? ?Laboratory Data: ? ?Lab Results  ?Component Value Date  ? CREATININE 1.53 (H) 09/27/2021  ? ? ?No results found for: PSA ? ?No results found for: TESTOSTERONE ? ?Lab Results  ?Component Value Date  ? HGBA1C 8.1 (A) 09/27/2021  ? ? ?Urinalysis ? ? ?  Pertinent Imaging: ?CLINICAL DATA:  Left flank pain. ?  ?EXAM: ?CT ABDOMEN AND PELVIS WITHOUT CONTRAST ?  ?TECHNIQUE: ?Multidetector CT imaging of the abdomen and pelvis was performed ?following the standard protocol without IV contrast. ?  ?COMPARISON:  CT abdomen and pelvis 10/06/2020. ?  ?FINDINGS: ?Lower chest: No acute abnormality. ?  ?Hepatobiliary: No focal liver abnormality is seen. No gallstones, ?gallbladder wall thickening, small gallstones are present. There is ?no biliary ductal dilatation. No focal liver lesions are identified. ?  ?Pancreas: Pancreatic calcifications, atrophy and ductal dilatation ?appear unchanged from the prior examination. ?  ?Spleen: Normal in size without focal abnormality. ?  ?Adrenals/Urinary Tract: Left adrenal adenoma is unchanged. Right ?adrenal gland is within normal limits. There is an 18 mm calculus in ?the left renal pelvis similar to the prior study. There is stable ?mild left-sided hydronephrosis. There are additional smaller ?bilateral renal calculi also similar to the prior study. Bilateral ?ureters are within normal limits as well as the bladder.  There is ?mild bilateral perinephric fat stranding. This is similar to the ?prior study. Right adrenal gland is within normal limits. ?  ?Stomach/Bowel: Stomach is within normal limits. Appendix appea

## 2021-10-25 ENCOUNTER — Ambulatory Visit: Payer: Self-pay | Admitting: Urology

## 2021-10-26 ENCOUNTER — Encounter: Payer: Self-pay | Admitting: Physician Assistant

## 2021-10-26 NOTE — Progress Notes (Signed)
Pt seen by Dr [name]. ? ?His testicular swelling is better. He still itches, that is on his lateral abd and back. He has finished the antibiotics. Legs only have a small amount of swelling. He is finishing up the steroids. The triamcinolone cream is helping. ? ?Dr Lynelle Doctor exam revealed that all the inflammation and swelling is gone. Completely improved.  ? ?He was given a discharge date of May 1, along w/ Drema Halon, and [place].  ? ?He has filled out applications for housing, the [facility] [place] [facility] [address].  ? ?Blood sugar was 185 today.  ? ?Theodore Demark, PA-C ?10/26/2021 ?4:28 PM ? ? ? ?

## 2021-10-27 ENCOUNTER — Other Ambulatory Visit: Payer: Self-pay

## 2021-10-27 ENCOUNTER — Other Ambulatory Visit: Payer: Self-pay | Admitting: *Deleted

## 2021-10-27 MED ORDER — SPIRONOLACTONE 25 MG PO TABS: 25.0000 mg | ORAL_TABLET | Freq: Every day | ORAL | 3 refills | Status: DC

## 2021-10-27 MED ORDER — AMLODIPINE BESYLATE 5 MG PO TABS: 5.0000 mg | ORAL_TABLET | Freq: Every day | ORAL | 11 refills | Status: DC

## 2021-10-27 MED ORDER — ATORVASTATIN CALCIUM 80 MG PO TABS
80.0000 mg | ORAL_TABLET | Freq: Every day | ORAL | 0 refills | Status: DC
  Filled 2021-10-27: qty 30, 30d supply, fill #0

## 2021-10-28 ENCOUNTER — Other Ambulatory Visit: Payer: Self-pay

## 2021-11-02 ENCOUNTER — Encounter: Payer: Self-pay | Admitting: Physician Assistant

## 2021-11-02 NOTE — Progress Notes (Signed)
Pt has been having numbness down his R arm, C7 distribution for at least a month.  ? ?He has been having R shoulder pain x 2 weeks or so.  ?He notices it more at night, whether on his back or his stomach. Aching type of pain. Comes and goes. No specific movements cause sx. No specific findings on exam.  No tender areas, no sx w/ resistance movements. No grating on movement.  ? ?CBG 288 today, missed his insulin yesterday because of being at [facility] because of a voice message. He had to go to [employer] and fill out some paperwork. He has seen the apartment and likes it. Has a small balcony, can have pets. Is on the ground floor.  ? ?May be out there in a couple of weeks.   ? ?May be running low on insulin, Myriam Jacobson will check in am.  ? ?Gave him Deanne Coffer, that is no help. Can use Voltaren cream or Salonpas heat patches if that does not help. Ok to take Tylenol at bedtime as needed.  ? ?Theodore Demark, PA-C ?11/02/2021 ?4:37 PM ? ? ? ? ? ? ?

## 2021-11-03 ENCOUNTER — Other Ambulatory Visit (HOSPITAL_COMMUNITY): Payer: Self-pay

## 2021-11-03 ENCOUNTER — Other Ambulatory Visit: Payer: Self-pay | Admitting: *Deleted

## 2021-11-03 ENCOUNTER — Other Ambulatory Visit: Payer: Self-pay

## 2021-11-03 MED ORDER — CARVEDILOL 25 MG PO TABS
25.0000 mg | ORAL_TABLET | Freq: Two times a day (BID) | ORAL | 5 refills | Status: DC
  Filled 2021-11-03: qty 60, 30d supply, fill #0

## 2021-11-03 MED ORDER — INSULIN GLARGINE 100 UNIT/ML SOLOSTAR PEN
25.0000 [IU] | PEN_INJECTOR | Freq: Every day | SUBCUTANEOUS | 11 refills | Status: DC
  Filled 2021-11-03: qty 9, 36d supply, fill #0

## 2021-11-03 MED ORDER — ATORVASTATIN CALCIUM 80 MG PO TABS
80.0000 mg | ORAL_TABLET | Freq: Every day | ORAL | 0 refills | Status: DC
  Filled 2021-11-03: qty 60, 60d supply, fill #0

## 2021-11-03 MED ORDER — INSULIN LISPRO (1 UNIT DIAL) 100 UNIT/ML (KWIKPEN)
5.0000 [IU] | PEN_INJECTOR | Freq: Two times a day (BID) | SUBCUTANEOUS | 11 refills | Status: DC
  Filled 2021-11-03: qty 3, 30d supply, fill #0

## 2021-11-03 MED ORDER — SPIRONOLACTONE 25 MG PO TABS
25.0000 mg | ORAL_TABLET | Freq: Every day | ORAL | 3 refills | Status: DC
  Filled 2021-11-03: qty 90, 90d supply, fill #0
  Filled 2022-07-13 – 2022-07-20 (×2): qty 90, 90d supply, fill #1

## 2021-11-03 MED ORDER — AMLODIPINE BESYLATE 5 MG PO TABS
5.0000 mg | ORAL_TABLET | Freq: Every day | ORAL | 11 refills | Status: DC
Start: 2021-11-03 — End: 2021-11-24
  Filled 2021-11-03: qty 30, 30d supply, fill #0

## 2021-11-04 ENCOUNTER — Other Ambulatory Visit: Payer: Self-pay

## 2021-11-07 ENCOUNTER — Other Ambulatory Visit: Payer: Self-pay

## 2021-11-09 ENCOUNTER — Encounter: Payer: Self-pay | Admitting: Physician Assistant

## 2021-11-09 NOTE — Progress Notes (Signed)
Pt doing well today.  ? ?Shoulder is better with the Abs Jr and cream, does not need more right now.  ? ?CBG was 188. He has plenty of insulin. He went to the pharmacy and picked up pens and needles.  ?He is compliant w/ insulin and other meds, Bonnita Nasuti still filling pill boxes.  ? ?He has an apartment assigned to him, in the process of being cleaned and painted.  ? ?No other issues or concerns. ? ?Rosaria Ferries, PA-C ?11/09/2021 ?3:20 PM ? ? ?

## 2021-11-15 NOTE — Progress Notes (Signed)
? ?11/16/21 ?8:48 AM  ? ?Rodney Barber ?[birth year] ?[id number] ? ?Referring provider:  ?Elsie Stain, MD ?[address] ?[address] ?[place],  [place] [address] ?Chief Complaint  ?Patient presents with  ? Nephrolithiasis  ? ? ? ? ?HPI: ?Rodney Barber is a 56 y.o.male who presents today for further evaluation of 18 mm calculus in the right renal pelvis, hydronephrosis, and hydrocele. ? ?He has a personal history of kidney stones in the past.   ? ?He was seen in the ED on 07/11/2021 with abdominal and back pain that was left sided. He had vomiting and was unable to keep medicine down. CBC and CMP unremarkable.  Urinalysis showed some blood. CT renal stone study visualized an 18 mm calculus in the left renal pelvis with stable mild left sided hydronephrosis. There were additional smaller bilateral renal calculi. Mild bilateral perinephric fat stranding. He is pain was under control and he was discharged with referral to urology.  ? ?Rodney Barber was earliest seen in 2020 RUS.  ? ?He was seen again on a separate occasion in the ED on 10/17/2021 for a blotchy raised and itchy rash on torso, arms and legs. He was also noted to have swelling in his testicles and scrotum that had been ongoing for a couple of days. STI testing was negative. Urinalysis showed moderate Hgb and rare bacteria but was otherwise unremarkable. Scrotal ultrasound visualized no evidence of testicular torsion. Moderate to large right hydrocele and small left hydrocele with diffuse scrotal wall thickening bilaterally.  He did be treated by the ER with Cipro which was later changed to Bactrim. ? ?He reports that he had no injury to his scrotum he had had infection in his scrotum before he reports that his swelling has gone down. He is a diabetic on insulin and it is fairly under control. He reports that he is anemic and his PCP, Dr Joya Gaskins, mentioned microscopic blood in his urine. ?  ?PMH: ?Past Medical History:  ?Diagnosis Date  ? Alcohol-induced  chronic pancreatitis ([place]) 07/22/2013  ? Anemia   ? Diabetes mellitus   ? Hypertension   ? Pancreatitis   ? Substance use disorder 02/18/2019  ? ? ?Surgical History: ?Past Surgical History:  ?Procedure Laterality Date  ? ERCP  06/27/2011  ? Procedure: ENDOSCOPIC RETROGRADE CHOLANGIOPANCREATOGRAPHY (ERCP);  Surgeon: Jeryl Columbia, MD;  Location: [facility];  Service: Endoscopy;  Laterality: N/A;  ? ERCP W/ PLASTIC STENT PLACEMENT  03/2011  ? IR US GUIDE BX ASP/DRAIN  05/16/2019  ? LEFT HEART CATH AND CORONARY ANGIOGRAPHY N/A 01/02/2020  ? Procedure: LEFT HEART CATH AND CORONARY ANGIOGRAPHY;  Surgeon: Martinique, Peter M, MD;  Location: [facility];  Service: Cardiovascular;  Laterality: N/A;  ? TEE WITHOUT CARDIOVERSION N/A 05/20/2019  ? Procedure: TRANSESOPHAGEAL ECHOCARDIOGRAM (TEE);  Surgeon: [name], MD;  Location: [facility];  Service: Cardiovascular;  Laterality: N/A;  ? ? ?Home Medications:  ?Allergies as of 11/16/2021   ?No Known Allergies ?  ? ?  ?Medication List  ?  ? ?  ? Accurate as of November 16, 2021  8:48 AM. If you have any questions, ask your nurse or doctor.  ?  ?  ? ?  ? ?Accu-Chek Guide test strip ?Generic drug: glucose blood ?Use to check blood sugar four times daily. E11.65 ?  ?Accu-Chek Guide w/Device Kit ?Use to check blood sugar four times daily. E11.65 ?  ?Accu-Chek Softclix Lancets lancets ?Ues to check blood sugar 4 (four) times daily. ?  ?  amLODipine 5 MG tablet ?Commonly known as: NORVASC ?Take 1 tablet (5 mg total) by mouth daily. ?  ?aspirin 81 MG chewable tablet ?HOLD due to Hematuria ?  ?atorvastatin 80 MG tablet ?Commonly known as: LIPITOR ?Take 1 tablet (80 mg total) by mouth daily at 6pm ?  ?carvedilol 25 MG tablet ?Commonly known as: COREG ?Take 1 tablet (25 mg total) by mouth 2 (two) times daily with a meal. ?  ?cetirizine 10 MG tablet ?Commonly known as: ZYRTEC ?Take 1 tablet (10 mg total) by mouth daily. ?  ?Creon 6000-19000 units Cpep ?Generic drug: Pancrelipase  (Lip-Prot-Amyl) ?Take 2 capsules (12,000 Units total) by mouth in the morning, at noon, and at bedtime. ?  ?feeding supplement (GLUCERNA SHAKE) Liqd ?Take 237 mLs by mouth 3 (three) times daily between meals. ?  ?ferrous sulfate 325 (65 FE) MG tablet ?Commonly known as: FeroSul ?Take 1 tablet (325 mg total) by mouth 2 (two) times daily with a meal. ?  ?gabapentin 100 MG capsule ?Commonly known as: Neurontin ?Take 1 capsule (100 mg total) by mouth 3 (three) times daily. ?  ?Glucose 4-6 GM-MG Chew ?Chew one as needed for blood glucose < 70 ?What changed:  ?how much to take ?how to take this ?when to take this ?reasons to take this ?  ?HumaLOG KwikPen 100 UNIT/ML KwikPen ?Generic drug: insulin lispro ?Inject 5 Units into the skin 2 (two) times daily before a meal. ?  ?hydrOXYzine 25 MG tablet ?Commonly known as: ATARAX ?Take 1 tablet (25 mg total) by mouth every 6 (six) hours as needed. ?  ?ibuprofen 200 MG tablet ?Commonly known as: ADVIL ?Take 400 mg by mouth every 6 (six) hours as needed for fever, headache or mild pain. ?  ?isosorbide mononitrate 30 MG 24 hr tablet ?Commonly known as: IMDUR ?Take 1 tablet (30 mg total) by mouth daily. ?  ?Lantus SoloStar 100 UNIT/ML Solostar Pen ?Generic drug: insulin glargine ?Inject 25 Units into the skin daily. ?  ?nitroGLYCERIN 0.4 MG SL tablet ?Commonly known as: NITROSTAT ?Place 1 tablet (0.4 mg total) under the tongue every 5 (five) minutes as needed for chest pain. ?  ?pantoprazole 40 MG tablet ?Commonly known as: PROTONIX ?Take 1 tablet (40 mg total) by mouth daily. ?  ?predniSONE 10 MG tablet ?Commonly known as: DELTASONE ?Take 2 tablets (20 mg total) by mouth 2 (two) times daily with a meal. ?  ?spironolactone 25 MG tablet ?Commonly known as: ALDACTONE ?Take 1 tablet (25 mg total) by mouth daily. ?  ?TechLite Pen Needles 31G X 5 MM Misc ?Generic drug: Insulin Pen Needle ?USE WITH INSULIN PENS ?  ?valsartan 320 MG tablet ?Commonly known as: DIOVAN ?Take 1 tablet (320 mg  total) by mouth daily. ?  ? ?  ? ? ?Allergies:  ?No Known Allergies ? ?Family History: ?Family History  ?Problem Relation Age of Onset  ? Diabetes Mother   ? Diabetes Brother   ? ? ?Social History:  reports that he has been smoking cigarettes. He has a 15.00 pack-year smoking history. He has never used smokeless tobacco. He reports that he does not currently use drugs after having used the following drugs: "Crack" cocaine, Other-see comments, and Cocaine. He reports that he does not drink alcohol. ? ? ?Physical Exam: ?BP (!) 108/54   Pulse 60   Ht _0  (1.727 m)   Wt 139 lb (63 kg)   BMI 21.13 kg/m?   ?Constitutional:  Alert and oriented, No acute distress. ?HEENT: [place] AT,  moist mucus membranes.  Trachea midline, no masses. ?Cardiovascular: No clubbing, cyanosis, or edema. ?Respiratory: Normal respiratory effort, no increased work of breathing. ?Skin: No rashes, bruises or suspicious lesions. ?GU: persistent thickened anterior scrotal skin no fluctuance, no clinical hydroceles bilaterally, no warmth, no redness, no edema ?Neurologic: Grossly intact, no focal deficits, moving all 4 extremities. ?Psychiatric: Normal mood and affect. ? ?Laboratory Data: ? ?Lab Results  ?Component Value Date  ? CREATININE 1.53 (H) 09/27/2021  ? ?Lab Results  ?Component Value Date  ? HGBA1C 8.1 (A) 09/27/2021  ? ? ?Urinalysis ?Pending  ? ?Pertinent Imaging: ?CLINICAL DATA:  Left flank pain. ?  ?EXAM: ?CT ABDOMEN AND PELVIS WITHOUT CONTRAST ?  ?TECHNIQUE: ?Multidetector CT imaging of the abdomen and pelvis was performed ?following the standard protocol without IV contrast. ?  ?COMPARISON:  CT abdomen and pelvis 10/06/2020. ?  ?FINDINGS: ?Lower chest: No acute abnormality. ?  ?Hepatobiliary: No focal liver abnormality is seen. No gallstones, ?gallbladder wall thickening, small gallstones are present. There is ?no biliary ductal dilatation. No focal liver lesions are identified. ?  ?Pancreas: Pancreatic calcifications, atrophy and  ductal dilatation ?appear unchanged from the prior examination. ?  ?Spleen: Normal in size without focal abnormality. ?  ?Adrenals/Urinary Tract: Left adrenal adenoma is unchanged. Right ?adrenal gland is within normal lim

## 2021-11-16 ENCOUNTER — Encounter: Payer: Self-pay | Admitting: Urology

## 2021-11-16 ENCOUNTER — Ambulatory Visit (INDEPENDENT_AMBULATORY_CARE_PROVIDER_SITE_OTHER): Payer: Medicaid Other | Admitting: Urology

## 2021-11-16 VITALS — BP 108/54 | HR 60 | Ht 68.0 in | Wt 139.0 lb

## 2021-11-16 DIAGNOSIS — N200 Calculus of kidney: Secondary | ICD-10-CM

## 2021-11-16 NOTE — Patient Instructions (Signed)
Ureteroscopy °Ureteroscopy is a procedure to check for and treat problems inside part of the urinary tract. In this procedure, a thin, flexible tube with a light at the end (ureteroscope) is used to look at the inside of the kidneys and the ureters. The ureters are the tubes that carry urine from the kidneys to the bladder. The ureteroscope is inserted into one or both of the ureters. °You may need this procedure if you have frequent urinary tract infections (UTIs), blood in your urine, or a stone in one of your ureters. A ureteroscopy can be done: °To find the cause of urine blockage in a ureter and to evaluate other abnormalities inside the ureters or kidneys. °To remove stones. °To remove or treat growths of tissue (polyps), abnormal tissue, and some types of tumors. °To remove a tissue sample and check it for disease under a microscope (biopsy). °Tell a health care provider about: °Any allergies you have. °All medicines you are taking, including vitamins, herbs, eye drops, creams, and over-the-counter medicines. °Any problems you or family members have had with anesthetic medicines. °Any blood disorders you have. °Any surgeries you have had. °Any medical conditions you have. °Whether you are pregnant or may be pregnant. °What are the risks? °Generally, this is a safe procedure. However, problems may occur, including: °Bleeding. °Infection. °Allergic reactions to medicines. °Scarring that narrows the ureter (stricture). °Creating a hole in the ureter (perforation). °What happens before the procedure? °Staying hydrated °Follow instructions from your health care provider about hydration, which may include: °Up to 2 hours before the procedure - you may continue to drink clear liquids, such as water, clear fruit juice, black coffee, and plain tea. ° °Eating and drinking restrictions °Follow instructions from your health care provider about eating and drinking, which may include: °8 hours before the procedure - stop  eating heavy meals or foods, such as meat, fried foods, or fatty foods. °6 hours before the procedure - stop eating light meals or foods, such as toast or cereal. °6 hours before the procedure - stop drinking milk or drinks that contain milk. °2 hours before the procedure - stop drinking clear liquids. °Medicines °Ask your health care provider about: °Changing or stopping your regular medicines. This is especially important if you are taking diabetes medicines or blood thinners. °Taking medicines such as aspirin and ibuprofen. These medicines can thin your blood. Do not take these medicines unless your health care provider tells you to take them. °Taking over-the-counter medicines, vitamins, herbs, and supplements. °General instructions °Do not use any products that contain nicotine or tobacco for at least 4 weeks before the procedure. These products include cigarettes, e-cigarettes, and chewing tobacco. If you need help quitting, ask your health care provider. °You may have a urine sample taken to check for infection. °Plan to have someone take you home from the hospital or clinic. °If you will be going home right after the procedure, plan to have someone with you for 24 hours. °Ask your health care provider what steps will be taken to help prevent infection. These may include: °Washing skin with a germ-killing soap. °Receiving antibiotic medicine. °What happens during the procedure? ° °An IV will be inserted into one of your veins. °You will be given one or more of the following: °A medicine to help you relax (sedative). °A medicine to make you fall asleep (general anesthetic). °A medicine that is injected into your spine to numb the area below and slightly above the injection site (spinal anesthetic). °The   part of your body that drains urine from your bladder (urethra) will be cleaned with a germ-killing solution. °The ureteroscope will be passed through your urethra into your bladder. °A salt-water solution will  be sent through the ureteroscope to fill your bladder. This will help the health care provider see the openings of your ureters more clearly. °The ureteroscope will be passed into your ureter. °If a growth is found, a biopsy may be done. °If a stone is found, it may be removed through the ureteroscope, or the stone may be broken up using a laser, shock waves, or electrical energy. °In some cases, if the ureter is too small, a tube may be inserted that keeps the ureter open (ureteral stent). The stent may be left in place for 1 or 2 weeks to keep the ureter open, and then the ureteroscopy procedure will be done. °The scope will be removed, and your bladder will be emptied. °The procedure may vary among health care providers and hospitals. °What can I expect after the procedure? °After your procedure, it is common to have: °Your blood pressure, heart rate, breathing rate, and blood oxygen level monitored until you leave the hospital or clinic. °A burning sensation when you urinate. You may be asked to urinate. °Blood in your urine. °Mild discomfort in your bladder area or kidney area when urinating. °A need to urinate more often or urgently. °Follow these instructions at home: °Medicines °Take over-the-counter and prescription medicines only as told by your health care provider. °If you were prescribed an antibiotic medicine, take it as told by your health care provider. Do not stop taking the antibiotic even if you start to feel better. °General instructions ° °If you were given a sedative during the procedure, it can affect you for several hours. Do not drive or operate machinery until your health care provider says that it is safe. °To relieve burning, take a warm bath or hold a warm washcloth over your groin. °Drink enough fluid to keep your urine pale yellow. °Drink two 8-ounce (237 mL) glasses of water every hour for the first 2 hours after you get home. °Continue to drink water often at home. °You can eat what  you normally do. °Keep all follow-up visits as told by your health care provider. This is important. °If you had a ureteral stent placed, ask your health care provider when you need to return to have it removed. °Contact a health care provider if you have: °Chills or a fever. °Burning pain for longer than 24 hours after the procedure. °Blood in your urine for longer than 24 hours after the procedure. °Get help right away if you have: °Large amounts of blood in your urine. °Blood clots in your urine. °Severe pain. °Chest pain or trouble breathing. °The feeling of a full bladder and you are unable to urinate. °These symptoms may represent a serious problem that is an emergency. Do not wait to see if the symptoms will go away. Get medical help right away. Call your local emergency services (911 in the [place] °Summary °Ureteroscopy is a procedure to check for and treat problems inside part of the urinary tract. °In this procedure, a thin, flexible tube with a light at the end (ureteroscope) is used to look at the inside of the kidneys and the ureters. °You may need this procedure if you have frequent urinary tract infections (UTIs), blood in your urine, or a stone in a ureter. °This information is not intended to replace advice given to   you by your health care provider. Make sure you discuss any questions you have with your health care provider. °Document Revised: 04/06/2021 Document Reviewed: 04/30/2019 °Elsevier Patient Education © 2022 [employer] ° °

## 2021-11-17 ENCOUNTER — Other Ambulatory Visit: Payer: Self-pay | Admitting: Urology

## 2021-11-17 ENCOUNTER — Other Ambulatory Visit: Payer: Self-pay | Admitting: *Deleted

## 2021-11-17 ENCOUNTER — Other Ambulatory Visit: Payer: Self-pay

## 2021-11-17 MED ORDER — TRIAMCINOLONE ACETONIDE 0.1 % EX CREA
1.0000 "application " | TOPICAL_CREAM | Freq: Two times a day (BID) | CUTANEOUS | 0 refills | Status: AC
  Filled 2021-11-17: qty 30, 15d supply, fill #0

## 2021-11-17 NOTE — Progress Notes (Signed)
Surgical Physician Order Form [facility]  * Scheduling expectation : Next Available  *Length of Case:   *Clearance needed: no  *Anticoagulation Instructions: Hold all anticoagulants  *Aspirin Instructions: Ok to continue Aspirin  *Post-op visit Date/Instructions:  1 month with RUS prior  *Diagnosis: Left Nephrolithiasis  *Procedure: left Ureteroscopy w/laser lithotripsy & stent placement (52356)   Additional orders: N/A  -Admit type: OUTpatient  -Anesthesia: General  -VTE Prophylaxis Standing Order SCD's       Other:   -Standing Lab Orders Per Anesthesia    Lab other: None  -Standing Test orders EKG/Chest x-ray per Anesthesia       Test other:   - Medications:  Ancef 2gm IV  -Other orders:  N/A       

## 2021-11-18 ENCOUNTER — Other Ambulatory Visit: Payer: Self-pay

## 2021-11-18 LAB — MICROSCOPIC EXAMINATION: Bacteria, UA: NONE SEEN

## 2021-11-18 LAB — URINALYSIS, COMPLETE
Bilirubin, UA: NEGATIVE
Glucose, UA: NEGATIVE
Ketones, UA: NEGATIVE
Leukocytes,UA: NEGATIVE
Nitrite, UA: NEGATIVE
Specific Gravity, UA: >1.03 — ABNORMAL HIGH (ref 1.005–1.030)
Urobilinogen, Ur: 0.2 mg/dL (ref 0.2–1.0)
pH, UA: 5.5 (ref 5.0–7.5)

## 2021-11-19 LAB — CULTURE, URINE COMPREHENSIVE

## 2021-11-21 ENCOUNTER — Telehealth: Payer: Self-pay

## 2021-11-21 NOTE — Telephone Encounter (Signed)
Tried calling patient to set up surgery, no answer. Did leave detailed message asking patient to call back to set up. Will try again.  ?

## 2021-11-22 ENCOUNTER — Other Ambulatory Visit: Payer: Self-pay

## 2021-11-23 ENCOUNTER — Other Ambulatory Visit: Payer: Self-pay

## 2021-11-23 ENCOUNTER — Encounter: Payer: Self-pay | Admitting: Physician Assistant

## 2021-11-23 NOTE — Progress Notes (Signed)
Pt seen by Dr [name]. ? ?He saw Dr Vanna Scotland, the Urologist.  His stone sits at an intersection, can move to block the ureter.  ? ?He is to have a L ureteroscopy w/ laser lithotripsy and stent placement.  ? ?He wishes to set the procedure for after he moves to his new apartment. Letta Kocher, CMA is the contact person at the urology office. Pt says his brother will provide transportation. ? ?CBG was 185 after breakfast, has been > 200 at times. Compliant w/ Glargine 25 U/day and short-acting 5 U bid.  ? ?Says is compliant w/ meds. Med box filled last Thursday, but is completely empty today. Myriam Jacobson will fill.  ? ?No chest pain or SOB.  ? ?He is eating better, but sometimes gets food when out w/ his cousin.  ? ?Still has itching and dry skin rash. Neck, elbow creases and forearms. Was given Lubriderm lotion. Mix w/ steroid cream and apply to affected areas.  ? ?[phone] was changed to mobile number. All other numbers deleted.  ? ?142/73, 65, 100% ? ?Theodore Demark, PA-C ?11/23/2021 ?3:42 PM ? ? ? ? ? ? ? ?  ?

## 2021-11-24 ENCOUNTER — Other Ambulatory Visit: Payer: Self-pay

## 2021-11-24 ENCOUNTER — Other Ambulatory Visit: Payer: Self-pay | Admitting: *Deleted

## 2021-11-24 MED ORDER — AMLODIPINE BESYLATE 5 MG PO TABS
5.0000 mg | ORAL_TABLET | Freq: Every day | ORAL | 11 refills | Status: DC
  Filled 2021-11-24: qty 30, 30d supply, fill #0

## 2021-11-24 MED ORDER — PANCRELIPASE (LIP-PROT-AMYL) 6000-19000 UNITS PO CPEP
2.0000 | ORAL_CAPSULE | Freq: Three times a day (TID) | ORAL | 0 refills | Status: DC
Start: 2021-11-24 — End: 2022-02-14
  Filled 2021-11-24 – 2021-11-25 (×2): qty 200, 34d supply, fill #0

## 2021-11-25 ENCOUNTER — Other Ambulatory Visit: Payer: Self-pay

## 2021-11-28 ENCOUNTER — Other Ambulatory Visit: Payer: Self-pay

## 2021-11-28 NOTE — Telephone Encounter (Signed)
Called patient and left message for patient to return my call to schedule surgery.  ?

## 2021-12-01 ENCOUNTER — Other Ambulatory Visit: Payer: Self-pay | Admitting: *Deleted

## 2021-12-01 ENCOUNTER — Other Ambulatory Visit: Payer: Self-pay

## 2021-12-01 MED ORDER — ATORVASTATIN CALCIUM 80 MG PO TABS
80.0000 mg | ORAL_TABLET | Freq: Every day | ORAL | 0 refills | Status: DC
  Filled 2021-12-01: qty 30, 30d supply, fill #0

## 2021-12-01 MED ORDER — GABAPENTIN 100 MG PO CAPS
100.0000 mg | ORAL_CAPSULE | Freq: Three times a day (TID) | ORAL | 0 refills | Status: DC
  Filled 2021-12-01: qty 90, 30d supply, fill #0

## 2021-12-01 MED ORDER — AMLODIPINE BESYLATE 5 MG PO TABS
5.0000 mg | ORAL_TABLET | Freq: Every day | ORAL | 11 refills | Status: DC
Start: 2021-12-01 — End: 2021-12-06
  Filled 2021-12-01: qty 30, 30d supply, fill #0

## 2021-12-02 ENCOUNTER — Other Ambulatory Visit: Payer: Self-pay

## 2021-12-02 ENCOUNTER — Inpatient Hospital Stay (HOSPITAL_COMMUNITY): Payer: Medicaid Other

## 2021-12-02 ENCOUNTER — Emergency Department (HOSPITAL_COMMUNITY): Payer: Medicaid Other

## 2021-12-02 ENCOUNTER — Encounter (HOSPITAL_COMMUNITY): Payer: Self-pay

## 2021-12-02 ENCOUNTER — Inpatient Hospital Stay (HOSPITAL_COMMUNITY)
Admission: EM | Admit: 2021-12-02 | Discharge: 2021-12-06 | DRG: 871 | Disposition: A | Discharge status: Home / Self Care | Payer: Medicaid Other | Attending: Family Medicine | Admitting: Family Medicine

## 2021-12-02 DIAGNOSIS — Z7982 Long term (current) use of aspirin: Secondary | ICD-10-CM | POA: Diagnosis not present

## 2021-12-02 DIAGNOSIS — J9601 Acute respiratory failure with hypoxia: Secondary | ICD-10-CM | POA: Diagnosis present

## 2021-12-02 DIAGNOSIS — N179 Acute kidney failure, unspecified: Secondary | ICD-10-CM | POA: Diagnosis present

## 2021-12-02 DIAGNOSIS — Z20822 Contact with and (suspected) exposure to covid-19: Secondary | ICD-10-CM | POA: Diagnosis present

## 2021-12-02 DIAGNOSIS — E785 Hyperlipidemia, unspecified: Secondary | ICD-10-CM | POA: Diagnosis present

## 2021-12-02 DIAGNOSIS — Z87442 Personal history of urinary calculi: Secondary | ICD-10-CM | POA: Diagnosis not present

## 2021-12-02 DIAGNOSIS — Z5901 Sheltered homelessness: Secondary | ICD-10-CM | POA: Diagnosis not present

## 2021-12-02 DIAGNOSIS — A419 Sepsis, unspecified organism: Secondary | ICD-10-CM

## 2021-12-02 DIAGNOSIS — Z833 Family history of diabetes mellitus: Secondary | ICD-10-CM | POA: Diagnosis not present

## 2021-12-02 DIAGNOSIS — Z79899 Other long term (current) drug therapy: Secondary | ICD-10-CM | POA: Diagnosis not present

## 2021-12-02 DIAGNOSIS — N200 Calculus of kidney: Secondary | ICD-10-CM

## 2021-12-02 DIAGNOSIS — Z7952 Long term (current) use of systemic steroids: Secondary | ICD-10-CM

## 2021-12-02 DIAGNOSIS — E1142 Type 2 diabetes mellitus with diabetic polyneuropathy: Secondary | ICD-10-CM | POA: Diagnosis present

## 2021-12-02 DIAGNOSIS — Z720 Tobacco use: Secondary | ICD-10-CM | POA: Diagnosis not present

## 2021-12-02 DIAGNOSIS — K861 Other chronic pancreatitis: Secondary | ICD-10-CM | POA: Diagnosis present

## 2021-12-02 DIAGNOSIS — D649 Anemia, unspecified: Secondary | ICD-10-CM | POA: Diagnosis present

## 2021-12-02 DIAGNOSIS — I5042 Chronic combined systolic (congestive) and diastolic (congestive) heart failure: Secondary | ICD-10-CM | POA: Diagnosis present

## 2021-12-02 DIAGNOSIS — Z794 Long term (current) use of insulin: Secondary | ICD-10-CM

## 2021-12-02 DIAGNOSIS — R21 Rash and other nonspecific skin eruption: Secondary | ICD-10-CM | POA: Diagnosis present

## 2021-12-02 DIAGNOSIS — J439 Emphysema, unspecified: Secondary | ICD-10-CM | POA: Diagnosis present

## 2021-12-02 DIAGNOSIS — I10 Essential (primary) hypertension: Secondary | ICD-10-CM | POA: Diagnosis present

## 2021-12-02 DIAGNOSIS — I110 Hypertensive heart disease with heart failure: Secondary | ICD-10-CM | POA: Diagnosis present

## 2021-12-02 DIAGNOSIS — I2510 Atherosclerotic heart disease of native coronary artery without angina pectoris: Secondary | ICD-10-CM | POA: Diagnosis present

## 2021-12-02 DIAGNOSIS — E1169 Type 2 diabetes mellitus with other specified complication: Secondary | ICD-10-CM | POA: Diagnosis present

## 2021-12-02 DIAGNOSIS — F17210 Nicotine dependence, cigarettes, uncomplicated: Secondary | ICD-10-CM | POA: Diagnosis present

## 2021-12-02 DIAGNOSIS — R0609 Other forms of dyspnea: Secondary | ICD-10-CM | POA: Diagnosis not present

## 2021-12-02 DIAGNOSIS — A4189 Other specified sepsis: Principal | ICD-10-CM | POA: Diagnosis present

## 2021-12-02 DIAGNOSIS — J122 Parainfluenza virus pneumonia: Secondary | ICD-10-CM | POA: Diagnosis present

## 2021-12-02 DIAGNOSIS — J189 Pneumonia, unspecified organism: Secondary | ICD-10-CM | POA: Diagnosis present

## 2021-12-02 LAB — URINALYSIS, ROUTINE W REFLEX MICROSCOPIC
Bilirubin Urine: NEGATIVE
Glucose, UA: 50 mg/dL — AB
Ketones, ur: NEGATIVE mg/dL
Leukocytes,Ua: NEGATIVE
Nitrite: NEGATIVE
Protein, ur: >=300 mg/dL — AB
Specific Gravity, Urine: 1.014 (ref 1.005–1.030)
pH: 5 (ref 5.0–8.0)

## 2021-12-02 LAB — GLUCOSE, CAPILLARY
Glucose-Capillary: 159 mg/dL — ABNORMAL HIGH (ref 70–99)
Glucose-Capillary: 88 mg/dL (ref 70–99)

## 2021-12-02 LAB — CBC WITH DIFFERENTIAL/PLATELET
Abs Immature Granulocytes: 0.06 10*3/uL (ref 0.00–0.07)
Basophils Absolute: 0.1 10*3/uL (ref 0.0–0.1)
Basophils Relative: 1 %
Eosinophils Absolute: 0 10*3/uL (ref 0.0–0.5)
Eosinophils Relative: 0 %
HCT: 28.5 % — ABNORMAL LOW (ref 39.0–52.0)
Hemoglobin: 9 g/dL — ABNORMAL LOW (ref 13.0–17.0)
Immature Granulocytes: 1 %
Lymphocytes Relative: 10 %
Lymphs Abs: 1.2 10*3/uL (ref 0.7–4.0)
MCH: 30.1 pg (ref 26.0–34.0)
MCHC: 31.6 g/dL (ref 30.0–36.0)
MCV: 95.3 fL (ref 80.0–100.0)
Monocytes Absolute: 0.7 10*3/uL (ref 0.1–1.0)
Monocytes Relative: 6 %
Neutro Abs: 9.7 10*3/uL — ABNORMAL HIGH (ref 1.7–7.7)
Neutrophils Relative %: 82 %
Platelets: 195 10*3/uL (ref 150–400)
RBC: 2.99 MIL/uL — ABNORMAL LOW (ref 4.22–5.81)
RDW: 14.4 % (ref 11.5–15.5)
WBC: 11.7 10*3/uL — ABNORMAL HIGH (ref 4.0–10.5)
nRBC: 0 % (ref 0.0–0.2)

## 2021-12-02 LAB — RESPIRATORY PANEL BY PCR

## 2021-12-02 LAB — COMPREHENSIVE METABOLIC PANEL
ALT: 22 U/L (ref 0–44)
AST: 50 U/L — ABNORMAL HIGH (ref 15–41)
Albumin: 3 g/dL — ABNORMAL LOW (ref 3.5–5.0)
Alkaline Phosphatase: 78 U/L (ref 38–126)
Anion gap: 7 (ref 5–15)
BUN: 25 mg/dL — ABNORMAL HIGH (ref 6–20)
CO2: 19 mmol/L — ABNORMAL LOW (ref 22–32)
Calcium: 8.1 mg/dL — ABNORMAL LOW (ref 8.9–10.3)
Chloride: 111 mmol/L (ref 98–111)
Creatinine, Ser: 1.84 mg/dL — ABNORMAL HIGH (ref 0.61–1.24)
GFR, Estimated: 42 mL/min — ABNORMAL LOW (ref >60)
Glucose, Bld: 186 mg/dL — ABNORMAL HIGH (ref 70–99)
Potassium: 4.3 mmol/L (ref 3.5–5.1)
Sodium: 137 mmol/L (ref 135–145)
Total Bilirubin: 0.3 mg/dL (ref 0.3–1.2)
Total Protein: 6.2 g/dL — ABNORMAL LOW (ref 6.5–8.1)

## 2021-12-02 LAB — STREP PNEUMONIAE URINARY ANTIGEN: Strep Pneumo Urinary Antigen: NEGATIVE

## 2021-12-02 LAB — PROCALCITONIN: Procalcitonin: 0.2 ng/mL

## 2021-12-02 LAB — RESP PANEL BY RT-PCR (FLU A&B, COVID) ARPGX2
Influenza A by PCR: NEGATIVE
Influenza B by PCR: NEGATIVE
SARS Coronavirus 2 by RT PCR: NEGATIVE

## 2021-12-02 LAB — HIV ANTIBODY (ROUTINE TESTING W REFLEX): HIV Screen 4th Generation wRfx: NONREACTIVE

## 2021-12-02 LAB — LACTIC ACID, PLASMA: Lactic Acid, Venous: 1.4 mmol/L (ref 0.5–1.9)

## 2021-12-02 LAB — BRAIN NATRIURETIC PEPTIDE: B Natriuretic Peptide: 561.9 pg/mL — ABNORMAL HIGH (ref 0.0–100.0)

## 2021-12-02 LAB — CBG MONITORING, ED: Glucose-Capillary: 141 mg/dL — ABNORMAL HIGH (ref 70–99)

## 2021-12-02 MED ORDER — HYDROCORTISONE SOD SUC (PF) 100 MG IJ SOLR
100.0000 mg | Freq: Two times a day (BID) | INTRAMUSCULAR | Status: DC
  Administered 2021-12-02 – 2021-12-04 (×4): 100 mg via INTRAVENOUS
  Filled 2021-12-02 (×5): qty 2

## 2021-12-02 MED ORDER — IPRATROPIUM-ALBUTEROL 0.5-2.5 (3) MG/3ML IN SOLN: 3.0000 mL | RESPIRATORY_TRACT | Status: DC | PRN

## 2021-12-02 MED ORDER — FUROSEMIDE 10 MG/ML IJ SOLN
60.0000 mg | Freq: Once | INTRAMUSCULAR | Status: AC
  Administered 2021-12-02: 60 mg via INTRAVENOUS
  Filled 2021-12-02: qty 6

## 2021-12-02 MED ORDER — NICOTINE 14 MG/24HR TD PT24
14.0000 mg | MEDICATED_PATCH | Freq: Every day | TRANSDERMAL | Status: DC
  Administered 2021-12-02 – 2021-12-06 (×5): 14 mg via TRANSDERMAL
  Filled 2021-12-02 (×5): qty 1

## 2021-12-02 MED ORDER — SPIRONOLACTONE 25 MG PO TABS
25.0000 mg | ORAL_TABLET | Freq: Every day | ORAL | Status: DC
  Administered 2021-12-02: 25 mg via ORAL
  Filled 2021-12-02: qty 1

## 2021-12-02 MED ORDER — HYDROCOD POLI-CHLORPHE POLI ER 10-8 MG/5ML PO SUER
5.0000 mL | Freq: Two times a day (BID) | ORAL | Status: DC | PRN
  Administered 2021-12-02 – 2021-12-05 (×2): 5 mL via ORAL
  Filled 2021-12-02 (×2): qty 5

## 2021-12-02 MED ORDER — ALBUTEROL SULFATE (2.5 MG/3ML) 0.083% IN NEBU
5.0000 mg | INHALATION_SOLUTION | Freq: Once | RESPIRATORY_TRACT | Status: AC
  Administered 2021-12-02: 5 mg via RESPIRATORY_TRACT
  Filled 2021-12-02: qty 6

## 2021-12-02 MED ORDER — IPRATROPIUM BROMIDE 0.02 % IN SOLN
0.5000 mg | Freq: Once | RESPIRATORY_TRACT | Status: AC
  Administered 2021-12-02: 0.5 mg via RESPIRATORY_TRACT
  Filled 2021-12-02: qty 2.5

## 2021-12-02 MED ORDER — FERROUS SULFATE 325 (65 FE) MG PO TABS
325.0000 mg | ORAL_TABLET | Freq: Two times a day (BID) | ORAL | Status: DC
  Administered 2021-12-02 – 2021-12-06 (×8): 325 mg via ORAL
  Filled 2021-12-02 (×8): qty 1

## 2021-12-02 MED ORDER — SODIUM CHLORIDE 0.9 % IV SOLN
500.0000 mg | INTRAVENOUS | Status: AC
  Administered 2021-12-03 – 2021-12-06 (×4): 500 mg via INTRAVENOUS
  Filled 2021-12-02 (×6): qty 5

## 2021-12-02 MED ORDER — SODIUM CHLORIDE 0.9 % IV SOLN
500.0000 mg | Freq: Once | INTRAVENOUS | Status: AC
  Administered 2021-12-02: 500 mg via INTRAVENOUS
  Filled 2021-12-02: qty 5

## 2021-12-02 MED ORDER — CHLORHEXIDINE GLUCONATE CLOTH 2 % EX PADS
6.0000 | MEDICATED_PAD | Freq: Every day | CUTANEOUS | Status: DC
  Administered 2021-12-02 – 2021-12-06 (×4): 6 via TOPICAL

## 2021-12-02 MED ORDER — ACETAMINOPHEN 325 MG PO TABS
650.0000 mg | ORAL_TABLET | Freq: Once | ORAL | Status: AC
  Administered 2021-12-02: 650 mg via ORAL
  Filled 2021-12-02: qty 2

## 2021-12-02 MED ORDER — ASPIRIN 81 MG PO CHEW
81.0000 mg | CHEWABLE_TABLET | Freq: Every day | ORAL | Status: DC
  Administered 2021-12-02 – 2021-12-06 (×5): 81 mg via ORAL
  Filled 2021-12-02 (×5): qty 1

## 2021-12-02 MED ORDER — ACETAMINOPHEN 325 MG PO TABS: 650.0000 mg | ORAL_TABLET | Freq: Four times a day (QID) | ORAL | Status: DC | PRN

## 2021-12-02 MED ORDER — ENOXAPARIN SODIUM 40 MG/0.4ML IJ SOSY
40.0000 mg | PREFILLED_SYRINGE | INTRAMUSCULAR | Status: DC
  Administered 2021-12-02 – 2021-12-05 (×4): 40 mg via SUBCUTANEOUS
  Filled 2021-12-02 (×5): qty 0.4

## 2021-12-02 MED ORDER — TRIAMCINOLONE ACETONIDE 0.1 % EX CREA
1.0000 "application " | TOPICAL_CREAM | Freq: Two times a day (BID) | CUTANEOUS | Status: DC
  Administered 2021-12-03 – 2021-12-06 (×6): 1 via TOPICAL
  Filled 2021-12-02 (×2): qty 15

## 2021-12-02 MED ORDER — GLUCERNA SHAKE PO LIQD
237.0000 mL | Freq: Three times a day (TID) | ORAL | Status: DC
  Administered 2021-12-02 – 2021-12-06 (×9): 237 mL via ORAL
  Filled 2021-12-02: qty 237

## 2021-12-02 MED ORDER — SODIUM CHLORIDE 0.9 % IV SOLN
2.0000 g | INTRAVENOUS | Status: AC
  Administered 2021-12-03 – 2021-12-06 (×4): 2 g via INTRAVENOUS
  Filled 2021-12-02 (×4): qty 20

## 2021-12-02 MED ORDER — PANCRELIPASE (LIP-PROT-AMYL) 12000-38000 UNITS PO CPEP
12000.0000 [IU] | ORAL_CAPSULE | Freq: Three times a day (TID) | ORAL | Status: DC
Start: 2021-12-02 — End: 2021-12-06
  Administered 2021-12-02 – 2021-12-06 (×11): 12000 [IU] via ORAL
  Filled 2021-12-02 (×14): qty 1

## 2021-12-02 MED ORDER — INSULIN GLARGINE-YFGN 100 UNIT/ML ~~LOC~~ SOLN
12.0000 [IU] | Freq: Every day | SUBCUTANEOUS | Status: DC
  Administered 2021-12-02 – 2021-12-05 (×3): 12 [IU] via SUBCUTANEOUS
  Filled 2021-12-02 (×4): qty 0.12

## 2021-12-02 MED ORDER — PANTOPRAZOLE SODIUM 40 MG PO TBEC
40.0000 mg | DELAYED_RELEASE_TABLET | Freq: Every day | ORAL | Status: DC
  Administered 2021-12-02 – 2021-12-06 (×5): 40 mg via ORAL
  Filled 2021-12-02 (×5): qty 1

## 2021-12-02 MED ORDER — MORPHINE SULFATE (PF) 2 MG/ML IV SOLN
1.0000 mg | INTRAVENOUS | Status: DC | PRN
  Administered 2021-12-02: 1 mg via INTRAVENOUS
  Filled 2021-12-02: qty 1

## 2021-12-02 MED ORDER — INSULIN ASPART 100 UNIT/ML IJ SOLN
0.0000 [IU] | Freq: Three times a day (TID) | INTRAMUSCULAR | Status: DC
  Administered 2021-12-02: 1 [IU] via SUBCUTANEOUS
  Administered 2021-12-02: 2 [IU] via SUBCUTANEOUS

## 2021-12-02 MED ORDER — AZITHROMYCIN 500 MG PO TABS: 500.0000 mg | ORAL_TABLET | Freq: Every day | ORAL | Status: DC

## 2021-12-02 MED ORDER — SODIUM CHLORIDE 0.9 % IV SOLN
1.0000 g | Freq: Once | INTRAVENOUS | Status: AC
  Administered 2021-12-02: 1 g via INTRAVENOUS
  Filled 2021-12-02: qty 10

## 2021-12-02 MED ORDER — ATORVASTATIN CALCIUM 80 MG PO TABS
80.0000 mg | ORAL_TABLET | Freq: Every day | ORAL | Status: DC
  Administered 2021-12-02 – 2021-12-06 (×5): 80 mg via ORAL
  Filled 2021-12-02 (×3): qty 1
  Filled 2021-12-02: qty 2
  Filled 2021-12-02: qty 1

## 2021-12-02 NOTE — Progress Notes (Signed)
Report called to receiving RN on [facility]. ?

## 2021-12-02 NOTE — Progress Notes (Addendum)
? ?  PCCM Interval Progress Note ? ?S:  ?Rodney Barber transferred to ICU form floor. Following up after being seen by the day team. ? ?O: ?Blood pressure (!) 160/72, pulse 91, temperature 99.3 ?F (37.4 ?C), temperature source Axillary, resp. rate (!) 28, height 5\' 8"  (1.727 m), weight 63 kg, SpO2 90 %. ?   ?FiO2 (%):  [100 %] 100 %  ? ?Intake/Output Summary (Last 24 hours) at 12/02/2021 2054 ?Last data filed at 12/02/2021 1812 ?Gross per 24 hour  ?Intake 350 ml  ?Output --  ?Net 350 ml  ? ?Filed Weights  ? 12/02/21 0752  ?Weight: 63 kg  ? ? ?Exam: ?General: In bed, thin, no acute distress ?Neuro: GCS 15, RASS 0 ?CV: NSR, no M/R/G, RRR ?Pulm: Crackles, trachea midline, chest expansion symmetric, tachypnic, unlabored  ? ?CXR: Worsening edema compared to previous film ? ?A: ?P: ?Acute respiratory failure with hypoxemia due to severe CAP ?HX tobacco abuse, ?COPD ?HX chronic systolic heart failure ?Now on NRB and HHF 100% 45L ?-Continue in ICU ?-Lasix 60mg  now ?-Goal SPO2 90-96% ?-Close monitoring for intubation. Continue NRB and HHF. Hoping diuresis will improve oxygenation. Kristen RN updated on plan and will notify if any resp distress or change in mental status. ?-Continue morphine, tussionex, hydrocortisone, ceftriaxone/azithro ? ? ?Critical care time: 21 minutes  ?  ? ? ?12/04/21., MSN, APRN, AGACNP-BC ? Pulmonary & Critical Care  ?12/02/2021 , 8:54 PM ? ?Please see [email or web address] for pager details ? ?If no response, please call [phone] ?After hours, please call Elink at [phone] ? ? ? ? ? ? ? ? ? ? ?

## 2021-12-02 NOTE — Progress Notes (Signed)
Interim Progress Note  ? ?Paged by RN that rapid response had been called as patient had desatted to the 80s on 10 L NRB. ? ?Went in to see patient- RN and Rapid Response RN at beside. He was tachypneic to 30s though stated that he felt well. He was intermittently coughing in the room. Lungs with diffuse crackles in upper and lower fields b/l. There was increased work of breathing noted with abdominal breathing. Discussed with Rapid Response RN about starting heated high flow to better assist patient given his multifocal PNA.  ? ?Respiratory en route to start heated high-flow. In the meantime, patient placed on both [place] and NRB at 10L and maintaining saturations >90% for the most part.  ? ?He likely has component of COPD given his long-standing smoking history- though this has not been confirmed. Will try to maintain saturations >90%.  ? ?Other orders placed: ?-Transfer to Progressive for higher level of care  ?-Duonebs ordered q2h PRN SOB, wheezing ?-PO Azithro changed to IV for tomorrow (s/p 1 dose CTX and Azithro today)  ?-Procalcitonin ? ?

## 2021-12-02 NOTE — ED Provider Notes (Signed)
?[facility] ?Provider Note ? ? ?CSN: [id number] ?Arrival date & time: 12/02/21  0747 ? ?  ? ?History ? ?Chief Complaint  ?Patient presents with  ? Fatigue  ? ? ?Rodney Barber is a 56 y.o. male. ? ?HPI ?Patient presents with weak and dizziness.  Has had diarrhea twice and vomiting once.  States felt lightheaded upon standing.  Frequent coughing on exam but denies shortness of breath but had sats of 69% upon arrival.  Now on nonrebreather up to about 100.  Denies fevers. ?  ?Past Medical History:  ?Diagnosis Date  ? 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 Anemia   ? Diabetes mellitus   ? Hypertension   ? Pancreatitis   ? Substance use disorder 02/18/2019  ? ? ?Home Medications ?Prior to Admission medications   ?Medication Sig Start Date End Date Taking? Authorizing Provider  ?Accu-Chek Softclix Lancets lancets Ues to check blood sugar 4 (four) times daily. 09/14/21   Elsie Stain, MD  ?amLODipine (NORVASC) 5 MG tablet Take 1 tablet (5 mg total) by mouth daily. 12/01/21   Elsie Stain, MD  ?aspirin 81 MG chewable tablet HOLD due to Hematuria 05/24/21   Elsie Stain, MD  ?atorvastatin (LIPITOR) 80 MG tablet Take 1 tablet (80 mg total) by mouth daily at 6pm 12/01/21   Elsie Stain, MD  ?Blood Glucose Monitoring Suppl (ACCU-CHEK GUIDE) w/Device KIT Use to check blood sugar four times daily. E11.65 09/14/21   Elsie Stain, MD  ?carvedilol (COREG) 25 MG tablet Take 1 tablet (25 mg total) by mouth 2 (two) times daily with a meal. 11/03/21   Elsie Stain, MD  ?cetirizine (ZYRTEC) 10 MG tablet Take 1 tablet (10 mg total) by mouth daily. 10/19/21   Elodia Florence., MD  ?feeding supplement, GLUCERNA SHAKE, (GLUCERNA SHAKE) LIQD Take 237 mLs by mouth 3 (three) times daily between meals. 09/11/19   Mariel Aloe, MD  ?ferrous sulfate (FEROSUL) 325 (65 FE) MG tablet Take 1 tablet (325 mg total) by mouth 2 (two) times daily with a meal. 10/06/21    Elsie Stain, MD  ?gabapentin (NEURONTIN) 100 MG capsule Take 1 capsule (100 mg total) by mouth 3 (three) times daily. 12/01/21   Elsie Stain, MD  ?Glucose 4-6 GM-MG CHEW Chew one as needed for blood glucose < 70 ?Patient taking differently: Chew 1 tablet by mouth 2 (two) times daily as needed (low glucose). Chew one as needed for blood glucose < 70 05/06/20   Elsie Stain, MD  ?glucose blood (ACCU-CHEK GUIDE) test strip Use to check blood sugar four times daily. E11.65 09/14/21   Elsie Stain, MD  ?hydrOXYzine (ATARAX) 25 MG tablet Take 1 tablet (25 mg total) by mouth every 6 (six) hours as needed. 10/18/21   Elsie Stain, MD  ?ibuprofen (ADVIL) 200 MG tablet Take 400 mg by mouth every 6 (six) hours as needed for fever, headache or mild pain.    [provider]  ?insulin glargine (LANTUS) 100 UNIT/ML Solostar Pen Inject 25 Units into the skin daily. 11/03/21   Elsie Stain, MD  ?insulin lispro (HUMALOG) 100 UNIT/ML KwikPen Inject 5 Units into the skin 2 (two) times daily before a meal. 11/03/21   Elsie Stain, MD  ?Insulin Pen Needle 31G X 5 MM MISC USE WITH INSULIN PENS 09/01/21   Elsie Stain, MD  ?isosorbide mononitrate (IMDUR) 30 MG 24 hr tablet Take 1  tablet (30 mg total) by mouth daily. 10/06/21   Elsie Stain, MD  ?nitroGLYCERIN (NITROSTAT) 0.4 MG SL tablet Place 1 tablet (0.4 mg total) under the tongue every 5 (five) minutes as needed for chest pain. 12/15/20   Elsie Stain, MD  ?Pancrelipase, Lip-Prot-Amyl, 6000-19000 units CPEP Take 2 capsules (12,000 Units total) by mouth in the morning, at noon, and at bedtime. 11/24/21   Elsie Stain, MD  ?pantoprazole (PROTONIX) 40 MG tablet Take 1 tablet (40 mg total) by mouth daily. 10/06/21   Elsie Stain, MD  ?predniSONE (DELTASONE) 10 MG tablet Take 2 tablets (20 mg total) by mouth 2 (two) times daily with a meal. 10/18/21   Elsie Stain, MD  ?spironolactone (ALDACTONE) 25 MG tablet Take 1 tablet (25 mg  total) by mouth daily. 11/03/21   Elsie Stain, MD  ?triamcinolone cream (KENALOG) 0.1 % Apply 1 application. topically 2 (two) times daily for 7 days. Use to rash on chest and back.  Do not use on face or groin or in skin folds. 11/17/21 12/08/21  Barrett, Evelene Croon, PA-C  ?valsartan (DIOVAN) 320 MG tablet Take 1 tablet (320 mg total) by mouth daily. 09/14/21   Elsie Stain, MD  ?Ferrous Sulfate Dried 200 (65 Fe) MG TABS TAKE 200 MG BY MOUTH 2 (TWO) TIMES DAILY WITH A MEAL. 02/02/21 04/20/21  Elsie Stain, MD  ?   ? ?Allergies    ?Patient has no known allergies.   ? ?Review of Systems   ?Review of Systems  ?Constitutional:  Positive for appetite change and fatigue.  ?Respiratory:  Positive for cough.   ?Gastrointestinal:  Positive for diarrhea and vomiting.  ?Genitourinary:  Negative for flank pain.  ?Musculoskeletal:  Negative for back pain.  ?Neurological:  Positive for light-headedness. Negative for weakness.  ? ?Physical Exam ?Updated Vital Signs ?BP 116/71 Comment: NRB  Pulse (!) 102 Comment: NRB  Temp (!) 103.1 ?F (39.5 ?C) (Rectal)   Resp (!) 29 Comment: NRB  Ht 5' 8"  (1.727 m)   Wt 63 kg   SpO2 98% Comment: NRB  BMI 21.13 kg/m?  ?Physical Exam ?Vitals and nursing note reviewed.  ?HENT:  ?   Head: Normocephalic.  ?Pulmonary:  ?   Comments: Diffuse harsh breath sounds. ?Abdominal:  ?   Tenderness: There is no abdominal tenderness.  ?Musculoskeletal:  ?   Cervical back: Neck supple.  ?   Right lower leg: Edema present.  ?   Left lower leg: Edema present.  ?Skin: ?   General: Skin is warm.  ?   Capillary Refill: Capillary refill takes less than 2 seconds.  ?Neurological:  ?   Mental Status: He is alert and oriented to person, place, and time.  ? ? ?ED Results / Procedures / Treatments   ?Labs ?(all labs ordered are listed, but only abnormal results are displayed) ?Labs Reviewed  ?CBC WITH DIFFERENTIAL/PLATELET - Abnormal; Notable for the following components:  ?    Result Value  ? WBC 11.7 (*)   ?  RBC 2.99 (*)   ? Hemoglobin 9.0 (*)   ? HCT 28.5 (*)   ? Neutro Abs 9.7 (*)   ? All other components within normal limits  ?COMPREHENSIVE METABOLIC PANEL - Abnormal; Notable for the following components:  ? CO2 19 (*)   ? Glucose, Bld 186 (*)   ? BUN 25 (*)   ? Creatinine, Ser 1.84 (*)   ? Calcium 8.1 (*)   ?  Total Protein 6.2 (*)   ? Albumin 3.0 (*)   ? AST 50 (*)   ? GFR, Estimated 42 (*)   ? All other components within normal limits  ?CULTURE, BLOOD (ROUTINE X 2)  ?CULTURE, BLOOD (ROUTINE X 2)  ?RESP PANEL BY RT-PCR (FLU A&B, COVID) [id number]  ?LACTIC ACID, PLASMA  ?LACTIC ACID, PLASMA  ?URINALYSIS, ROUTINE W REFLEX MICROSCOPIC  ?BRAIN NATRIURETIC PEPTIDE  ? ? ?EKG ?EKG Interpretation ? ?Date/Time:  Friday December 02 2021 08:04:55 EDT ?Ventricular Rate:  88 ?PR Interval:  157 ?QRS Duration: 79 ?QT Interval:  345 ?QTC Calculation: 418 ?R Axis:   42 ?Text Interpretation: Sinus rhythm Probable left atrial enlargement RSR' in V1 or V2, probably normal variant Nonspecific T abnormalities, lateral leads Confirmed by Davonna Belling [phone]) on 12/02/2021 9:33:39 AM ? ?Radiology ?DG Chest Portable 1 View ? ?Result Date: 12/02/2021 ?CLINICAL DATA:  hypoxia, cough EXAM: PORTABLE CHEST 1 VIEW COMPARISON:  Radiograph 10/06/2020 FINDINGS: There is airspace disease in the left mid lung and right lower lung. There is no pleural effusion or pneumothorax. Cardiomediastinal silhouette is unchanged. No acute osseous abnormality. IMPRESSION: Multifocal pneumonia with confluent airspace disease in the left mid lung and right lower lung. Electronically Signed   By: Maurine Simmering M.D.   On: 12/02/2021 08:22   ? ?Procedures ?Procedures  ? ? ?Medications Ordered in ED ?Medications  ?cefTRIAXone (ROCEPHIN) 1 g in sodium chloride 0.9 % 100 mL IVPB (0 g Intravenous Stopped 12/02/21 0909)  ?azithromycin (ZITHROMAX) 500 mg in sodium chloride 0.9 % 250 mL IVPB (500 mg Intravenous New Bag/Given 12/02/21 0828)  ?acetaminophen (TYLENOL) tablet 650 mg (650  mg Oral Given 12/02/21 0921)  ? ? ?ED Course/ Medical Decision Making/ A&P ?  ?                        ?Medical Decision Making ?Amount and/or Complexity of Data Reviewed ?Labs: ordered. ?Radiology: ordered. ?

## 2021-12-02 NOTE — ED Notes (Signed)
RT is going to give neb treatments and has already pulled from pyxis ?

## 2021-12-02 NOTE — Progress Notes (Signed)
PT transported on NRB to 2H. Placed pt on HHFNC 45L 100% with NRB 15L 100%. Report given to RT.  ?

## 2021-12-02 NOTE — H&P (Addendum)
Family Medicine Teaching Service ?Hospital Admission History and Physical ?Service Pager: [phone] ? ?Patient name: Rodney Barber Medical record number: [id number] ?Date of birth: [birth year] Age: 56 y.o. Gender: male ? ?Primary Care Provider: Elsie Stain, MD ?Consultants: N/a ?Code Status: FULL  ?Preferred Emergency Contact: Rodney Barber, brother, # in chart confirmed correct by the patient ? ?Chief Complaint: Fatigue ? ?Assessment and Plan: ?Rodney Barber is a 56 y.o. male presenting with 1 day of fatigue, generalized weakness found to be hypoxic. PMH is significant for CHF, CAD, HTN, DM, HLD, pancreatitis, anemia. ? ?Multifocal PNA ?Meeting sepsis criteria.  Hypoxic to 86.  Now on nonrebreather 10 L.  Duo nebs and antibiotics started in ED. Negative COVID. ?- Admit to family practice teaching service, attending Dr. Andria Frames ?- Continue oral azithromycin for additional 4 days, consider adding Augmentin tomorrow otherwise continue ceftriaxone until blood cultures return ?- Follow-up blood cultures ?- A.m. CMP, CBC ?- Continue DuoNebs as needed ?- Wean O2 to room air as tolerated ?- Consider need for IVF ? ?AKI  Hx of renal stones  ?Cr 1.84 baseline 1.1-1.5.  Recent history of diarrhea and vomiting.  Likely due to volume losses. Of note, referred to urology last ED visit.  ?- F/u UA ?- A.m. CMP ?- Encourage oral intake ? ?CHF  CAD  HTN ?Most recent echo 04/12/2021 with EF improvement from 35% to normal.  Current home regimen: Coreg 25 mg twice daily, spironolactone 25 mg daily, Imdur 30 mg daily, amlodipine 5 mg daily, valsartan 320 mg daily, ASA 81 mg daily, nitro 0.4 mg. ?- Continue spironolactone in the setting of lower BPs and AKI ?- Continue home medications when appropriate ?-- Consider continuing to hold ASA if hematuria persistent ?- AM CBC ?- Follow-up BNP ?-Vital signs ?- daily wt ?- strict I/O ? ?T2DM ?Most recent A1c 8.1.  Humalog 5 units twice daily, Lantus 25 units daily ?-Lantus 12  units ?-Sensitive sliding scale ?- CBGs 3 times daily before meals and at bedtime ? ?HLD ?-Continue Lipitor 80 mg daily ? ?Pancreatitis ?AST/ALT 50/22 ?-Continue formulary for pancrelipase TID ?- A.m. CMP ? ?Anemia, stable ?- Continue iron supplement ?- AM CBC ? ?Rash ?Reviewed pictures in chart and appears to be eczematous patches over the trunk. ?-Continue triamcinolone ? ?Tobacco use disorder ?-Nicotine patch ? ?FEN/GI: Heart healthy, PPI ?Prophylaxis: Lovenox ? ?Disposition: Med telemetry, INPT ? ?History of Present Illness:  Rodney Barber is a 56 y.o. male presenting with  ?1 day of fatigue, shortness of breath, weakness.  Associated diarrhea and vomiting as well as cough.  Prior to was in his usual state of health.  States that he lives in a shelter and others were complaining about how cold it was and now having similar symptoms such as cough.  He states otherwise he is doing well.  Denies any issues with urinating or ambulating.  He endorses taking all of his prescribed medication but has not taken it this morning.  He walks with a walker at baseline.  He denies alcohol, tobacco, illicit drug use.  He does however state he does have a distant history of these.  ? ?Review Of Systems: Per HPI with the following additions:  ? ?Review of Systems  ?Constitutional:  Positive for fever.  ?HENT:  Negative for ear pain, postnasal drip, rhinorrhea and sore throat.   ?Eyes:  Negative for visual disturbance.  ?Respiratory:  Positive for cough, chest tightness and shortness of breath.   ?Gastrointestinal:  Negative for abdominal pain, constipation  and nausea.  ?Genitourinary:  Negative for decreased urine volume and difficulty urinating.  ?Neurological:  Positive for weakness. Negative for headaches.   ? ?Patient Active Problem List  ? Diagnosis Date Noted  ? Coronary artery disease 04/20/2020  ? S/P thoracentesis   ? Chronic combined systolic (congestive) and diastolic (congestive) heart failure (HCC)   ? Essential  hypertension   ? Tobacco abuse 05/14/2019  ? Type 2 diabetes mellitus ([place]) 03/15/2018  ? Hyperlipidemia associated with type 2 diabetes mellitus ([place]) 09/08/2013  ? ? ?Past Medical History: ?Past Medical History:  ?Diagnosis Date  ? 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 Anemia   ? Diabetes mellitus   ? Hypertension   ? Pancreatitis   ? Substance use disorder 02/18/2019  ? ? ?Past Surgical History: ?Past Surgical History:  ?Procedure Laterality Date  ? ERCP  06/27/2011  ? Procedure: ENDOSCOPIC RETROGRADE CHOLANGIOPANCREATOGRAPHY (ERCP);  Surgeon: Jeryl Columbia, MD;  Location: [facility];  Service: Endoscopy;  Laterality: N/A;  ? ERCP W/ PLASTIC STENT PLACEMENT  03/2011  ? IR US GUIDE BX ASP/DRAIN  05/16/2019  ? LEFT HEART CATH AND CORONARY ANGIOGRAPHY N/A 01/02/2020  ? Procedure: LEFT HEART CATH AND CORONARY ANGIOGRAPHY;  Surgeon: Martinique, Peter M, MD;  Location: [facility];  Service: Cardiovascular;  Laterality: N/A;  ? TEE WITHOUT CARDIOVERSION N/A 05/20/2019  ? Procedure: TRANSESOPHAGEAL ECHOCARDIOGRAM (TEE);  Surgeon: [name], MD;  Location: [facility];  Service: Cardiovascular;  Laterality: N/A;  ? ? ?Social History: ?Social History  ? ?Tobacco Use  ? Smoking status: Every Day  ?  Packs/day: 0.50  ?  Years: 30.00  ?  Pack years: 15.00  ?  Types: Cigarettes  ? Smokeless tobacco: Never  ? Tobacco comments:  ?  6 cigarett /day  ?Vaping Use  ? Vaping Use: Never used  ?Substance Use Topics  ? Alcohol use: No  ? Drug use: Not Currently  ?  Types: "Crack" cocaine, Other-see comments, Cocaine  ?  Comment: last smoked crack 12-16-16  ? ?Additional social history: lives in shelter  ?Please also refer to relevant sections of EMR. ? ?Family History: ?Family History  ?Problem Relation Age of Onset  ? Diabetes Mother   ? Diabetes Brother   ? ? ?Allergies and Medications: ?No Known Allergies ?No current facility-administered medications on file prior to encounter.  ? ?Current Outpatient  Medications on File Prior to Encounter  ?Medication Sig Dispense Refill  ? Accu-Chek Softclix Lancets lancets Ues to check blood sugar 4 (four) times daily. 100 each 0  ? amLODipine (NORVASC) 5 MG tablet Take 1 tablet (5 mg total) by mouth daily. 30 tablet 11  ? aspirin 81 MG chewable tablet HOLD due to Hematuria 120 tablet 0  ? atorvastatin (LIPITOR) 80 MG tablet Take 1 tablet (80 mg total) by mouth daily at 6pm 30 tablet 0  ? Blood Glucose Monitoring Suppl (ACCU-CHEK GUIDE) w/Device KIT Use to check blood sugar four times daily. E11.65 1 kit 0  ? carvedilol (COREG) 25 MG tablet Take 1 tablet (25 mg total) by mouth 2 (two) times daily with a meal. 60 tablet 5  ? cetirizine (ZYRTEC) 10 MG tablet Take 1 tablet (10 mg total) by mouth daily. 30 tablet 11  ? feeding supplement, GLUCERNA SHAKE, (GLUCERNA SHAKE) LIQD Take 237 mLs by mouth 3 (three) times daily between meals.  0  ? ferrous sulfate (FEROSUL) 325 (65 FE) MG tablet Take 1 tablet (325 mg total) by mouth 2 (  two) times daily with a meal. 180 tablet 3  ? gabapentin (NEURONTIN) 100 MG capsule Take 1 capsule (100 mg total) by mouth 3 (three) times daily. 90 capsule 0  ? Glucose 4-6 GM-MG CHEW Chew one as needed for blood glucose < 70 (Patient taking differently: Chew 1 tablet by mouth 2 (two) times daily as needed (low glucose). Chew one as needed for blood glucose < 70) 100 tablet 0  ? glucose blood (ACCU-CHEK GUIDE) test strip Use to check blood sugar four times daily. E11.65 100 each 12  ? hydrOXYzine (ATARAX) 25 MG tablet Take 1 tablet (25 mg total) by mouth every 6 (six) hours as needed. 15 tablet 0  ? ibuprofen (ADVIL) 200 MG tablet Take 400 mg by mouth every 6 (six) hours as needed for fever, headache or mild pain.    ? insulin glargine (LANTUS) 100 UNIT/ML Solostar Pen Inject 25 Units into the skin daily. 9 mL 11  ? insulin lispro (HUMALOG) 100 UNIT/ML KwikPen Inject 5 Units into the skin 2 (two) times daily before a meal. 3 mL 11  ? Insulin Pen Needle 31G  X 5 MM MISC USE WITH INSULIN PENS 100 each 6  ? isosorbide mononitrate (IMDUR) 30 MG 24 hr tablet Take 1 tablet (30 mg total) by mouth daily. 90 tablet 3  ? nitroGLYCERIN (NITROSTAT) 0.4 MG SL tablet Place 1 tablet (0.

## 2021-12-02 NOTE — Significant Event (Incomplete)
Rapid Response Event Note  ? ?Reason for Call :  ?O2 desat on NRB with O2 flow at 10L ? ?Initial Focused Assessment:  ?Patient is alert and oriented.  He is lying in the bed.  He is complaining of shortness of breath.  Mild increased work of breathing but he has tachypnea. Lung sounds coarse ?Heart tones regular ?He feels cold, but is warm to the touch ? ?BP 157/80  SR 87  RR 38  O2 sat 85% on NRB with 10L flow ? ?Dr Melba Coon came to bedside ? ?Interventions:  ?Increased O2 to 15L NRB ?Patient feels better ?When he has coughing fits his O2 sats drop to 88% and take a few minutes to recover. ?Placed on 15L [place] and 15L NRB  O2 sats 94% ?Productive cough ? ?Placed on heated HF 30L/100% ?With an additional coughing fit he became very labored and O2 sats decreased to 87%.  ?Placed NRB in addition to heated HF ? ?Plan of Care:  ? ? ? ?Event Summary:  ? ?MD Notified: Melba Coon ?Call Time: 1636 ?Arrival Time: 1640 ?End Time:  ? ?Marcellina Millin, RN ?

## 2021-12-02 NOTE — ED Notes (Signed)
Patient took NRB off to eat and desat to 73% placed back on NRB and sats came back up ?

## 2021-12-02 NOTE — Hospital Course (Addendum)
Rodney Barber is a 56 y.o.male with a history of COPD, CHF, CAD, HTN, DM, HLD, pancreatic insufficiency, anemia who was admitted to the family medicine teaching Service at [facility] for multifocal pneumonia. His hospital course is detailed below: ? ?Multifocal pneumonia with acute respiratory failure in the setting of COPD ?Patient met sepsis criteria on admission was hypoxic to 86%.  He was started on nonrebreather 10 L and tolerated this well, appropriately decreased oxygen supplementation over the course of his hospitalization.  He was started on antibiotics and given DuoNebs. Patient's respiratory status decompensated requiring significant supplemental oxygen and PCCM was consulted. Patient was transferred to the ICU as he needed increasing oxygen requirement.  While in the ICU, he was found to be parainfluenza positive on RVP.  He continued with CAP treatment, steroids, Breo, DuoNebs and was able to wean to high flow.  He was transferred back to the floor on 12/05/2021.  Room air prior to discharge. Switched from breo to [place] due to cost and insurance coverage. Baseline O2 requirement at room air. ? ?AKI renal stones ?AKI in the setting of recent diarrhea and vomiting, and possibly due to volume loss.  Patient sees urology outpatient. Cr improved, baseline appears to be around 1.3 or less.  ? ?CHF  CAD  ?HTN  ?Continue home antihypertensives. Cr at baseline, 1.3-1.5 as our best guess. Coreg for CHF, HTN, CAD rather than metop.  ?- Continued coreg 12.74m BID  ?- Continued home lasix 447mdaily ?- Valsartan 403maily ?- isosorbide mononitrate 81m50mhr daily ? ?Pancreatic insufficiency  ?- Creon ? ?Other chronic conditions were medically managed with home medications and formulary alternatives as necessary (type 2 diabetes, hyperlipidemia, pancreatitis, anemia, rash, tobacco use disorder) ? ?PCP Follow-up Recommendations: ?CHF: Consider re-adding spironolactone if BP and volume status can tolerate for mortality  benefits.  ?HTN: uptitrate coreg if needed. ?COPD: Dulera from breo due to cost ?DM2: Semglee 15u daily ?

## 2021-12-02 NOTE — Consult Note (Signed)
? ?NAME:  Drazen Eckert, MRN:  [id number], DOB:  Nov 28, 1965, LOS: 0 ?ADMISSION DATE:  12/02/2021, CONSULTATION DATE:  12/02/21 ?REFERRING MD:  Hensel - FPTS, CHIEF COMPLAINT:  SOB hypoxia   ? ?History of Present Illness:  ? ?56 yo M PMH substance use disorder, HTN, pancreatitis, homelessness, CAD, combined systolic and diastolic HF, tobacco abuse, DM2presented to [facility] 4[facility]28 with diarrhea, vomiting, dizziness, and cough productive pink thick sputum. Initially SOB or chest pain, but was found to be hypoxic with spo2 69% on arrival, requiring NRB. Cxr with multifocal PNA. Admitted to FPTS for further management and care.  ?Over course of hospital day 1, pt has decompensated from respiratory perspective, with increasing O2 requirement and progressive cough, marked increase WOB requiring HHFNC 30L/100% + 15L NRB, with improvement in SpO2 from 80s to low 90s. He has had progressive associated tachypnea.  ? ?PCCM is consulted for evaluation in this setting  ? ?Pertinent  Medical History  ?CAD ?Systolic and diastolic HF ?HTN ?HLD ?Tobacco abuse  ?Substance use disorder  ? ?Significant Hospital Events: ?Including procedures, antibiotic start and stop dates in addition to other pertinent events   ?4/28 admit to FPTS after presenting with dizziness, vomiting diarrhea.  Profoundly hypoxic and with multifocal PNA. PCCM consult due to escalating O2 needs  ? ?Interim History / Subjective:  ?Incr O2 needs over course of day now HHFNC 30L/100% + 15L NRB, with improvement in SpO2 from 80s to low 90s. He has had progressive associated tachypnea.  ? ? ?Objective   ?Blood pressure (!) 160/72, pulse 89, temperature 98 ?F (36.7 ?C), temperature source Axillary, resp. rate (!) 30, height 5\' 8"  (1.727 m), weight 63 kg, SpO2 96 %. ?   ?FiO2 (%):  [100 %] 100 %  ? ?Intake/Output Summary (Last 24 hours) at 12/02/2021 1815 ?Last data filed at 12/02/2021 1812 ?Gross per 24 hour  ?Intake 350 ml  ?Output --  ?Net 350 ml  ? ?Filed Weights  ? 12/02/21  0752  ?Weight: 63 kg  ? ? ?Examination: ?General: chronically ill appearing 56 year old male currently in acute distress when coughing but then settles down. He is on heated high flow and NRB mask ?HENT: [place], poor dentition. Neck veins are flat  ?Lungs: diffuse rhonchi, wheezing on left. + WOB PCXR w/ Left > right airspace disease.  ?Cardiovascular: tachy rrr ?Abdomen: soft not tender  ?Extremities: warm dry  ?Neuro: awake and oriented ?GU: due to void  ? ?Resolved Hospital Problem list   ?pending ? ?Assessment & Plan:  ?Principal Problem: ?  Community acquired pneumonia ?Active Problems: ?  Acute respiratory failure with hypoxia (HCC) ?  Sepsis (HCC) ?  AKI (acute kidney injury) (HCC) ?  Nephrolithiasis ?  Hyperlipidemia associated with type 2 diabetes mellitus (HCC) ?  Tobacco abuse ?  Chronic combined systolic (congestive) and diastolic (congestive) heart failure (HCC) ?  Essential hypertension ?  Coronary artery disease ? ? ?Acute respiratory failure with hypoxia 2/2 Multifocal PNA-- CAP +/- Pulmonary edema  ?Superimposed on Tobacco abuse & prob COPD  ?Plan ?repeat CXR looking for new pulm edema  ?Cont broad spec azith and ceftriaxone day 1 ?Day 1 hydrocortisone ?Scheduled BDs ?Supplemental oxygen w/ high risk for intubation ?Sending: Urine strep, urine legionella, RVP ?Sputum if able  ?Cough suppression w/ tussinex and morphine  ?-transfer to ICU  ? ?Sepsis 2/2 above w/ nml lactate ?Plan ?Cont hydration  ?Abx as above  ? ?AKI superimposed on Nephrolithiasis  ?-op plan for ureteroscopy possible  stent  ?Now creatine 1.84 > baseline 1.3 range ?Plan ?IV hydration ?Renal dose meds ?OP f/u w/ urology  ? ?CAD ?Combined systolic and diastolic HF ?HTN ?Plan ?-home coreg, spiro, imdur, amlodipine, valsartan  ?-this admit BNP 562 ?Plan ?Holding his coreg, imdur, amlodipine, valsartan ?Cont stain and asa ? ? ?DM2 with polyneuropathy  ?Plan ?Ssi  ? ?HLD ?Plan ?atorvastatin  ? ? ?Best Practice (right click and "Reselect all  SmartList Selections" daily)  ? ?Diet/type: NPO w/ oral meds ?DVT prophylaxis: LMWH ?GI prophylaxis: N/A ?Lines: N/A ?Foley:  N/A ?Code Status:  full code ?Last date of multidisciplinary goals of care discussion [pending] ? ?Labs   ?CBC: ?Recent Labs  ?Lab 12/02/21 ?0800  ?WBC 11.7*  ?NEUTROABS 9.7*  ?HGB 9.0*  ?HCT 28.5*  ?MCV 95.3  ?PLT 195  ? ? ?Basic Metabolic Panel: ?Recent Labs  ?Lab 12/02/21 ?0800  ?NA 137  ?K 4.3  ?CL 111  ?CO2 19*  ?GLUCOSE 186*  ?BUN 25*  ?CREATININE 1.84*  ?CALCIUM 8.1*  ? ?GFR: ?Estimated Creatinine Clearance: 39.5 mL/min (A) (by C-G formula based on SCr of 1.84 mg/dL (H)). ?Recent Labs  ?Lab 12/02/21 ?0800 12/02/21 ?1703  ?PROCALCITON  --  0.20  ?WBC 11.7*  --   ?LATICACIDVEN 1.4  --   ? ? ?Liver Function Tests: ?Recent Labs  ?Lab 12/02/21 ?0800  ?AST 50*  ?ALT 22  ?ALKPHOS 78  ?BILITOT 0.3  ?PROT 6.2*  ?ALBUMIN 3.0*  ? ?No results for input(s): LIPASE, AMYLASE in the last 168 hours. ?No results for input(s): AMMONIA in the last 168 hours. ? ?ABG ?   ?Component Value Date/Time  ? PHART 7.256 (L) 12/28/2019 0752  ? PCO2ART 33.8 12/28/2019 0752  ? PO2ART 120 (H) 12/28/2019 0752  ? HCO3 24.1 01/14/2020 2025  ? TCO2 16 (L) 12/28/2019 0752  ? ACIDBASEDEF 2.1 (H) 01/14/2020 2025  ? O2SAT 37.1 01/14/2020 2025  ?  ? ?Coagulation Profile: ?No results for input(s): INR, PROTIME in the last 168 hours. ? ?Cardiac Enzymes: ?No results for input(s): CKTOTAL, CKMB, CKMBINDEX, TROPONINI in the last 168 hours. ? ?HbA1C: ?Hemoglobin A1C  ?Date/Time Value Ref Range Status  ?02/21/2021 09:39 AM 8.7 (A) 4.0 - 5.6 % Final  ? ?HbA1c, POC (controlled diabetic range)  ?Date/Time Value Ref Range Status  ?09/27/2021 02:39 PM 8.1 (A) 0.0 - 7.0 % Final  ?11/15/2020 02:24 PM 9.6 (A) 0.0 - 7.0 % Final  ? ? ?CBG: ?Recent Labs  ?Lab 12/02/21 ?1134 12/02/21 ?1755  ?GLUCAP 141* 159*  ? ? ?Review of Systems:   ?Review of Systems  ?Constitutional:  Positive for fever and malaise/fatigue.  ?HENT:  Positive for congestion.    ?Eyes: Negative.   ?Respiratory:  Positive for cough, sputum production, shortness of breath and wheezing.   ?Cardiovascular:  Negative for chest pain and leg swelling.  ?Gastrointestinal:  Positive for diarrhea and nausea.  ?Genitourinary: Negative.   ?Musculoskeletal:  Positive for myalgias.  ?Neurological:  Positive for headaches.  ?Endo/Heme/Allergies: Negative.   ?Psychiatric/Behavioral: Negative.    ? ? ?Past Medical History:  ?He,  has a past medical history of Alcohol-induced chronic pancreatitis (HCC) (07/22/2013), Anemia, Diabetes mellitus, Hypertension, Pancreatitis, and Substance use disorder (02/18/2019).  ? ?Surgical History:  ? ?Past Surgical History:  ?Procedure Laterality Date  ? ERCP  06/27/2011  ? Procedure: ENDOSCOPIC RETROGRADE CHOLANGIOPANCREATOGRAPHY (ERCP);  Surgeon: Petra Kuba, MD;  Location: [facility];  Service: Endoscopy;  Laterality: N/A;  ? ERCP W/ PLASTIC STENT PLACEMENT  03/2011  ?  IR US GUIDE BX ASP/DRAIN  05/16/2019  ? LEFT HEART CATH AND CORONARY ANGIOGRAPHY N/A 01/02/2020  ? Procedure: LEFT HEART CATH AND CORONARY ANGIOGRAPHY;  Surgeon: [place], Sandeep Delagarza M, MD;  Location: [facility];  Service: Cardiovascular;  Laterality: N/A;  ? TEE WITHOUT CARDIOVERSION N/A 05/20/2019  ? Procedure: TRANSESOPHAGEAL ECHOCARDIOGRAM (TEE);  Surgeon: Chrystie Nose, MD;  Location: [facility] ENDOS[facility];  Service: Cardiovascular;  Laterality: N/A;  ?  ? ?Social History:  ? reports that he has been smoking cigarettes. He has a 15.00 pack-year smoking history. He has never used smokeless tobacco. He reports that he does not currently use drugs after having used the following drugs: "Crack" cocaine, Other-see comments, and Cocaine. He reports that he does not drink alcohol.  ? ?Family History:  ?His family history includes Diabetes in his brother and mother.  ? ?Allergies ?No Known Allergies  ? ?Home Medications  ?Prior to Admission medications   ?Medication Sig Start Date End Date Taking? Authorizing  Provider  ?amLODipine (NORVASC) 5 MG tablet Take 1 tablet (5 mg total) by mouth daily. 12/01/21  Yes [name], MD  ?aspirin 81 MG chewable tablet HOLD due to Hematuria ?Patient taking differently:

## 2021-12-02 NOTE — ED Notes (Signed)
RT at bedside to switch patient to [place]  ?

## 2021-12-02 NOTE — Progress Notes (Signed)
Called by Rapid RN to assess pt. Pt is on 15L [place]/ NRB saturating in high 80s and has labored breathing. Pt was placed on 30L/100% HHFNC and 15L NRB due to desaturations. Pt is maintaining sats 92-94%. RN and rapid RN at bedside to witness.  ?

## 2021-12-02 NOTE — Progress Notes (Signed)
Attending: ? ? ?Subjective: ?56 y/o smoker who [facility] presented to the [facility] ER complaining of dyspnea and cough which started 2-3 days ago.  He is coughing up bloody sputum, unclear if he is having fever or chills.  He still smokes cigarettes.  No known sick contacts.  Some problems with his teeth in the last few months. Denies EtOH or illicit drug use.  ?He was admitted by the FPTS for severe CAP.  Treated with ceftriaxone/azithromycin.  PCCM consulted for worsening hypoxemia despite HHF O2.  This is noted primarily when he moves around or with cough.   ? ?Past Medical History:  ?Diagnosis Date  ? 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 Anemia   ? Diabetes mellitus   ? Hypertension   ? Pancreatitis   ? Substance use disorder 02/18/2019  ? ? ? ?Objective: ?Vitals:  ? 12/02/21 1659 12/02/21 1729 12/02/21 1730 12/02/21 1739  ?BP: (!) 157/80 (!) 160/72    ?Pulse: 87 90  89  ?Resp: (!) 37 20 (!) 26 (!) 30  ?Temp:  98 ?F (36.7 ?C)    ?TempSrc:  Axillary    ?SpO2: 93% 91%  96%  ?Weight:      ?Height:      ? ?FiO2 (%):  [100 %] 100 % ? ?Intake/Output Summary (Last 24 hours) at 12/02/2021 1838 ?Last data filed at 12/02/2021 1812 ?Gross per 24 hour  ?Intake 350 ml  ?Output --  ?Net 350 ml  ? ? ?General:  Crhonically ill appearing, male coughing frequently, using accessory muscles but speaking in full sentences ?HENT: NCAT OP clear poor dentition ?PULM: LUL predominant wheezing, no crackles, increased respiratory effort ?CV: RRR, no mgr ?GI: BS+, soft, nontender ?MSK: normal bulk and tone ?Derm: plaques noted over posterior neck ?Neuro: awake, alert, MAEW ? ? ?CBC ?   ?Component Value Date/Time  ? WBC 11.7 (H) 12/02/2021 0800  ? RBC 2.99 (L) 12/02/2021 0800  ? HGB 9.0 (L) 12/02/2021 0800  ? HGB 8.4 (L) 09/27/2021 1415  ? HCT 28.5 (L) 12/02/2021 0800  ? HCT 26.3 (L) 09/27/2021 1415  ? PLT 195 12/02/2021 0800  ? PLT 205 09/27/2021 1415  ? MCV 95.3 12/02/2021 0800   ? MCV 92 09/27/2021 1415  ? MCH 30.1 12/02/2021 0800  ? MCHC 31.6 12/02/2021 0800  ? RDW 14.4 12/02/2021 0800  ? RDW 12.8 09/27/2021 1415  ? LYMPHSABS 1.2 12/02/2021 0800  ? LYMPHSABS 1.6 09/27/2021 1415  ? MONOABS 0.7 12/02/2021 0800  ? EOSABS 0.0 12/02/2021 0800  ? EOSABS 0.4 09/27/2021 1415  ? BASOSABS 0.1 12/02/2021 0800  ? BASOSABS 0.1 09/27/2021 1415  ? ? ?BMET ?   ?Component Value Date/Time  ? NA 137 12/02/2021 0800  ? NA 145 (H) 09/27/2021 1415  ? K 4.3 12/02/2021 0800  ? CL 111 12/02/2021 0800  ? CO2 19 (L) 12/02/2021 0800  ? GLUCOSE 186 (H) 12/02/2021 0800  ? BUN 25 (H) 12/02/2021 0800  ? BUN 23 09/27/2021 1415  ? CREATININE 1.84 (H) 12/02/2021 0800  ? CREATININE 0.84 05/13/2015 1120  ? CALCIUM 8.1 (L) 12/02/2021 0800  ? GFRNONAA 42 (L) 12/02/2021 0800  ? GFRNONAA >89 05/13/2015 1120  ? GFRAA 77 06/01/2020 1231  ? GFRAA >89 05/13/2015 1120  ? ? ?CXR images personally reviewed, emphysema bilaterally, LUL infiltrate ? ?Impression/Plan: ?Acute respiratory failure with hypoxemia due to severe CAP, ddx includes new pulmonary edema but I doubt this based on physical  exam.  Could have bacterial or viral cause ?> admit to ICU ?> continue HHF O2 ?> monitor carefully, may need intubation ?> add cough suppression now> tussionex ?> morphine now for relief of dyspnea ?> check urine strep/leg ?> check RSV ?> add hydrocortisone to ceftriaxone/azithro ?> repeat CXR now, if pulm edema then lasix ? ?DM2 ?> continue symglee/SSI with accuchecks ? ?Chronic pancreatitis ?> creon ? ?Chronic systolic heart failure> not clearly in exacerbation ?> check echo ? ?Rest per NP note ? ?My cc time 33 minutes ? ?Roselie Awkward, MD ?[place] [facility] ?Pager: [phone] ?Cell: [phone] ?After 7pm: [id number] ? ?

## 2021-12-02 NOTE — Progress Notes (Signed)
?   12/02/21 1659  ?Assess: MEWS Score  ?BP (!) 157/80  ?Pulse Rate 87  ?ECG Heart Rate 86  ?Resp (!) 37  ?Level of Consciousness Alert  ?SpO2 93 %  ?O2 Device Non-rebreather Mask  ?O2 Flow Rate (L/min) 15 L/min  ?Assess: MEWS Score  ?MEWS Temp 0  ?MEWS Systolic 0  ?MEWS Pulse 0  ?MEWS RR 3  ?MEWS LOC 0  ?MEWS Score 3  ?MEWS Score Color Yellow  ?Assess: if the MEWS score is Yellow or Red  ?Were vital signs taken at a resting state? Yes  ?Focused Assessment Change from prior assessment (see assessment flowsheet)  ?Early Detection of Sepsis Score *See Row Information* Medium  ?MEWS guidelines implemented *See Row Information* Yes  ?Treat  ?MEWS Interventions Escalated (See documentation below)  ?Take Vital Signs  ?Increase Vital Sign Frequency  Yellow: Q 2hr X 2 then Q 4hr X 2, if remains yellow, continue Q 4hrs  ?Escalate  ?MEWS: Escalate Yellow: discuss with charge nurse/RN and consider discussing with provider and RRT  ?Notify: Charge Nurse/RN  ?Name of Charge Nurse/RN Notified Darl Pikes, RN  ?Date Charge Nurse/RN Notified 12/02/21  ?Time Charge Nurse/RN Notified 1659  ?Notify: Provider  ?Provider Name/Title Harlow Asa, Mabel  ?Date Provider Notified 12/02/21  ?Time Provider Notified 1659  ?Notification Type Call  ?Notification Reason Change in status  ?Provider response En route  ?Date of Provider Response 12/02/21  ?Time of Provider Response 1700  ?Notify: Rapid Response  ?Name of Rapid Response RN Notified Council Mechanic, RN  ?Date Rapid Response Notified 12/02/21  ?Time Rapid Response Notified 1659  ?Document  ?Patient Outcome Transferred/level of care increased  ?Progress note created (see row info) Yes  ? ? ?

## 2021-12-02 NOTE — ED Triage Notes (Signed)
Patient from [facility], reports he had 2 episodes of diarrhea and 1 episode of vomiting last night and has felt weak this morning and dizzy upon standing. Denies sob cp.  ?

## 2021-12-03 DIAGNOSIS — J189 Pneumonia, unspecified organism: Secondary | ICD-10-CM | POA: Diagnosis not present

## 2021-12-03 DIAGNOSIS — J9601 Acute respiratory failure with hypoxia: Secondary | ICD-10-CM | POA: Diagnosis not present

## 2021-12-03 LAB — COMPREHENSIVE METABOLIC PANEL
ALT: 30 U/L (ref 0–44)
AST: 130 U/L — ABNORMAL HIGH (ref 15–41)
Albumin: 2.7 g/dL — ABNORMAL LOW (ref 3.5–5.0)
Alkaline Phosphatase: 66 U/L (ref 38–126)
Anion gap: 6 (ref 5–15)
BUN: 27 mg/dL — ABNORMAL HIGH (ref 6–20)
CO2: 21 mmol/L — ABNORMAL LOW (ref 22–32)
Calcium: 8.1 mg/dL — ABNORMAL LOW (ref 8.9–10.3)
Chloride: 114 mmol/L — ABNORMAL HIGH (ref 98–111)
Creatinine, Ser: 1.84 mg/dL — ABNORMAL HIGH (ref 0.61–1.24)
GFR, Estimated: 42 mL/min — ABNORMAL LOW (ref >60)
Glucose, Bld: 85 mg/dL (ref 70–99)
Potassium: 3.7 mmol/L (ref 3.5–5.1)
Sodium: 141 mmol/L (ref 135–145)
Total Bilirubin: 0.5 mg/dL (ref 0.3–1.2)
Total Protein: 6 g/dL — ABNORMAL LOW (ref 6.5–8.1)

## 2021-12-03 LAB — MAGNESIUM: Magnesium: 2 mg/dL (ref 1.7–2.4)

## 2021-12-03 LAB — CBC
HCT: 29.9 % — ABNORMAL LOW (ref 39.0–52.0)
Hemoglobin: 10 g/dL — ABNORMAL LOW (ref 13.0–17.0)
MCH: 30.7 pg (ref 26.0–34.0)
MCHC: 33.4 g/dL (ref 30.0–36.0)
MCV: 91.7 fL (ref 80.0–100.0)
Platelets: 196 10*3/uL (ref 150–400)
RBC: 3.26 MIL/uL — ABNORMAL LOW (ref 4.22–5.81)
RDW: 14.2 % (ref 11.5–15.5)
WBC: 13.4 10*3/uL — ABNORMAL HIGH (ref 4.0–10.5)
nRBC: 0 % (ref 0.0–0.2)

## 2021-12-03 LAB — GLUCOSE, CAPILLARY
Glucose-Capillary: 89 mg/dL (ref 70–99)
Glucose-Capillary: 104 mg/dL — ABNORMAL HIGH (ref 70–99)
Glucose-Capillary: 134 mg/dL — ABNORMAL HIGH (ref 70–99)
Glucose-Capillary: 387 mg/dL — ABNORMAL HIGH (ref 70–99)
Glucose-Capillary: 407 mg/dL — ABNORMAL HIGH (ref 70–99)

## 2021-12-03 MED ORDER — INSULIN ASPART 100 UNIT/ML IJ SOLN
0.0000 [IU] | Freq: Every day | INTRAMUSCULAR | Status: DC
  Administered 2021-12-03: 5 [IU] via SUBCUTANEOUS

## 2021-12-03 MED ORDER — INSULIN ASPART 100 UNIT/ML IJ SOLN
0.0000 [IU] | Freq: Three times a day (TID) | INTRAMUSCULAR | Status: DC
  Administered 2021-12-04: 9 [IU] via SUBCUTANEOUS
  Administered 2021-12-04 (×2): 7 [IU] via SUBCUTANEOUS
  Administered 2021-12-05 (×2): 3 [IU] via SUBCUTANEOUS
  Administered 2021-12-05: 2 [IU] via SUBCUTANEOUS

## 2021-12-03 MED ORDER — FUROSEMIDE 10 MG/ML IJ SOLN: 60.0000 mg | Freq: Once | INTRAMUSCULAR | Status: DC

## 2021-12-03 MED ORDER — DEXTROSE-NACL 5-0.9 % IV SOLN: INTRAVENOUS | Status: AC

## 2021-12-03 MED ORDER — FUROSEMIDE 10 MG/ML IJ SOLN
40.0000 mg | Freq: Once | INTRAMUSCULAR | Status: AC
  Administered 2021-12-03: 40 mg via INTRAVENOUS
  Filled 2021-12-03: qty 4

## 2021-12-03 MED ORDER — FLUTICASONE FUROATE-VILANTEROL 200-25 MCG/ACT IN AEPB
1.0000 | INHALATION_SPRAY | Freq: Every day | RESPIRATORY_TRACT | Status: DC
  Administered 2021-12-03 – 2021-12-06 (×4): 1 via RESPIRATORY_TRACT
  Filled 2021-12-03: qty 28

## 2021-12-03 NOTE — Consult Note (Signed)
? ?NAME:  Rodney Barber, MRN:  [id number], DOB:  12/23/1965, LOS: 1 ?ADMISSION DATE:  12/02/2021, CONSULTATION DATE:  12/02/21 ?REFERRING MD:  Hensel - FPTS, CHIEF COMPLAINT:  SOB hypoxia   ? ?History of Present Illness:  ? ?56 yo M PMH substance use disorder, HTN, pancreatitis, homelessness, CAD, combined systolic and diastolic HF, tobacco abuse, DM2presented to [facility] 4/28 with diarrhea, vomiting, dizziness, and cough productive pink thick sputum. Initially SOB or chest pain, but was found to be hypoxic with spo2 69% on arrival, requiring NRB. Cxr with multifocal PNA. Admitted to FPTS for further management and care.  ?Over course of hospital day 1, pt has decompensated from respiratory perspective, with increasing O2 requirement and progressive cough, marked increase WOB requiring HHFNC 30L/100% + 15L NRB, with improvement in SpO2 from 80s to low 90s. He has had progressive associated tachypnea.  ? ?PCCM is consulted for evaluation in this setting  ? ?Pertinent  Medical History  ?CAD ?Systolic and diastolic HF ?HTN ?HLD ?Tobacco abuse  ?Substance use disorder  ? ?Significant Hospital Events: ?Including procedures, antibiotic start and stop dates in addition to other pertinent events   ?4/28 admit to FPTS after presenting with dizziness, vomiting diarrhea.  Profoundly hypoxic and with multifocal PNA. PCCM consult due to escalating O2 needs  ?4/29 Transferred to ICU overnight for worsening respiratory failure. Diuresed ? ?Interim History / Subjective:  ?Transferred to ICU overnight for worsening respiratory failure ?CXR concerning for worsening pulmonary edema ? ?Objective   ?Blood pressure 132/67, pulse 83, temperature 98.3 ?F (36.8 ?C), temperature source Oral, resp. rate 20, height 5\' 8"  (1.727 m), weight 63.2 kg, SpO2 92 %. ?   ?FiO2 (%):  [80 %-100 %] 90 %  ? ?Intake/Output Summary (Last 24 hours) at 12/03/2021 0823 ?Last data filed at 12/03/2021 0800 ?Gross per 24 hour  ?Intake 600 ml  ?Output 1950 ml  ?Net -1350  ml  ? ? ?Filed Weights  ? 12/02/21 0752 12/03/21 0500  ?Weight: 63 kg 63.2 kg  ? ?Physical Exam: ?General: Well-appearing, no acute distress, appears comfortable ?HENT: [place], AT, OP clear, MMM ?Eyes: EOMI, no scleral icterus ?Respiratory: Coarse breath sounds with bibasilar crackles ?Cardiovascular: RRR, -M/R/G, no JVD ?GI: BS+, soft, nontender ?Extremities:-Edema,-tenderness ?Neuro: AAO x4, CNII-XII grossly intact ?Skin: Intact, no rashes or bruising ?Psych: Normal mood, normal affect ? ?Resolved Hospital Problem list   ?pending ? ?Assessment & Plan:  ?Principal Problem: ?  Community acquired pneumonia ?Active Problems: ?  Hyperlipidemia associated with type 2 diabetes mellitus (HCC) ?  AKI (acute kidney injury) (HCC) ?  Tobacco abuse ?  Acute respiratory failure with hypoxia (HCC) ?  Chronic combined systolic (congestive) and diastolic (congestive) heart failure (HCC) ?  Essential hypertension ?  Coronary artery disease ?  Sepsis (HCC) ?  Nephrolithiasis ? ? ?Acute respiratory failure with hypoxia 2/2 Parainfluenza PNA +/- CAP +/- Pulmonary edema  ?Superimposed on Tobacco abuse & prob COPD  ?Plan ?Transferred to ICU overnight for increased FIO2 ?On HHFNC. High risk for intubation ?Goal SpO2 >88% ?Continue CAP coverage D2 ?Day 2 hydrocortisone ?Scheduled BD ?Add Breo daily ?RVP and Urine strep neg. F/u urine legionella ?Sputum if able  ?Cough suppression w/ tussinex and morphine  ? ?Sepsis 2/2 above w/ nml lactate ?Plan ?Abx as above  ?Echo ? ?AKI superimposed on Nephrolithiasis  ?-op plan for ureteroscopy possible stent  ?Now creatine 1.84 > baseline 1.3 range ?Plan ?IV hydration ?Renal dose meds ?OP f/u w/ urology  ? ?CAD ?Combined systolic and  diastolic HF ?HTN ?Plan ?-home coreg, spiro, imdur, amlodipine, valsartan  ?-this admit BNP 562 ?Plan ?Holding his coreg, imdur, amlodipine, valsartan ?Cont stain and asa ?Echo ? ?DM2 with polyneuropathy  ?Plan ?Ssi  ? ?HLD ?Plan ?atorvastatin  ? ? ?Best Practice (right  click and "Reselect all SmartList Selections" daily)  ? ?Diet/type: NPO w/ oral meds D5NS ?DVT prophylaxis: LMWH ?GI prophylaxis: N/A ?Lines: N/A ?Foley:  N/A ?Code Status:  full code ?Last date of multidisciplinary goals of care discussion [pending] ? ?Labs   ?CBC: ?Recent Labs  ?Lab 12/02/21 ?0800 12/03/21 ?0125  ?WBC 11.7* 13.4*  ?NEUTROABS 9.7*  --   ?HGB 9.0* 10.0*  ?HCT 28.5* 29.9*  ?MCV 95.3 91.7  ?PLT 195 196  ? ? ? ?Basic Metabolic Panel: ?Recent Labs  ?Lab 12/02/21 ?0800 12/03/21 ?0125  ?NA 137 141  ?K 4.3 3.7  ?CL 111 114*  ?CO2 19* 21*  ?GLUCOSE 186* 85  ?BUN 25* 27*  ?CREATININE 1.84* 1.84*  ?CALCIUM 8.1* 8.1*  ?MG  --  2.0  ? ? ?GFR: ?Estimated Creatinine Clearance: 39.6 mL/min (A) (by C-G formula based on SCr of 1.84 mg/dL (H)). ?Recent Labs  ?Lab 12/02/21 ?0800 12/02/21 ?1703 12/03/21 ?0125  ?PROCALCITON  --  0.20  --   ?WBC 11.7*  --  13.4*  ?LATICACIDVEN 1.4  --   --   ? ? ? ?Liver Function Tests: ?Recent Labs  ?Lab 12/02/21 ?0800 12/03/21 ?0125  ?AST 50* 130*  ?ALT 22 30  ?ALKPHOS 78 66  ?BILITOT 0.3 0.5  ?PROT 6.2* 6.0*  ?ALBUMIN 3.0* 2.7*  ? ? ?No results for input(s): LIPASE, AMYLASE in the last 168 hours. ?No results for input(s): AMMONIA in the last 168 hours. ? ?ABG ?   ?Component Value Date/Time  ? PHART 7.256 (L) 12/28/2019 0752  ? PCO2ART 33.8 12/28/2019 0752  ? PO2ART 120 (H) 12/28/2019 0752  ? HCO3 24.1 01/14/2020 2025  ? TCO2 16 (L) 12/28/2019 0752  ? ACIDBASEDEF 2.1 (H) 01/14/2020 2025  ? O2SAT 37.1 01/14/2020 2025  ? ?  ? ?Coagulation Profile: ?No results for input(s): INR, PROTIME in the last 168 hours. ? ?Cardiac Enzymes: ?No results for input(s): CKTOTAL, CKMB, CKMBINDEX, TROPONINI in the last 168 hours. ? ?HbA1C: ?Hemoglobin A1C  ?Date/Time Value Ref Range Status  ?02/21/2021 09:39 AM 8.7 (A) 4.0 - 5.6 % Final  ? ?HbA1c, POC (controlled diabetic range)  ?Date/Time Value Ref Range Status  ?09/27/2021 02:39 PM 8.1 (A) 0.0 - 7.0 % Final  ?11/15/2020 02:24 PM 9.6 (A) 0.0 - 7.0 %  Final  ? ? ?CBG: ?Recent Labs  ?Lab 12/02/21 ?1134 12/02/21 ?1755 12/02/21 ?2023 12/03/21 ?0037  ?GLUCAP 141* 159* 88 89  ? ? ? ?Critical care time: ?  ? ?The patient is critically ill with multiple organ systems failure and requires high complexity decision making for assessment and support, frequent evaluation and titration of therapies, application of advanced monitoring technologies and extensive interpretation of multiple databases.  Independent Critical Care Time: 45 Minutes.  ? ?Mechele Collin, M.D. ?[facility] ?12/03/2021 8:23 AM  ? ?Please see Amion for pager number to reach on-call Pulmonary and Critical Care Team. ? ? ? ? ?

## 2021-12-04 ENCOUNTER — Inpatient Hospital Stay (HOSPITAL_COMMUNITY): Payer: Medicaid Other

## 2021-12-04 DIAGNOSIS — J9601 Acute respiratory failure with hypoxia: Secondary | ICD-10-CM | POA: Diagnosis not present

## 2021-12-04 DIAGNOSIS — R0609 Other forms of dyspnea: Secondary | ICD-10-CM

## 2021-12-04 DIAGNOSIS — J189 Pneumonia, unspecified organism: Secondary | ICD-10-CM | POA: Diagnosis not present

## 2021-12-04 LAB — HEMOGLOBIN A1C
Hgb A1c MFr Bld: 7.9 % — ABNORMAL HIGH (ref 4.8–5.6)
Mean Plasma Glucose: 180.03 mg/dL

## 2021-12-04 LAB — ECHOCARDIOGRAM COMPLETE
AR max vel: 2.08 cm2
AV Area VTI: 2.05 cm2
AV Area mean vel: 1.9 cm2
AV Mean grad: 4 mmHg
AV Peak grad: 7.5 mmHg
Ao pk vel: 1.37 m/s
Area-P 1/2: 2.68 cm2
Height: 68 in
S' Lateral: 3.9 cm
Weight: 2014.12 oz

## 2021-12-04 LAB — BASIC METABOLIC PANEL
Anion gap: 6 (ref 5–15)
BUN: 34 mg/dL — ABNORMAL HIGH (ref 6–20)
CO2: 23 mmol/L (ref 22–32)
Calcium: 7.6 mg/dL — ABNORMAL LOW (ref 8.9–10.3)
Chloride: 108 mmol/L (ref 98–111)
Creatinine, Ser: 1.83 mg/dL — ABNORMAL HIGH (ref 0.61–1.24)
GFR, Estimated: 43 mL/min — ABNORMAL LOW (ref >60)
Glucose, Bld: 232 mg/dL — ABNORMAL HIGH (ref 70–99)
Potassium: 3.7 mmol/L (ref 3.5–5.1)
Sodium: 137 mmol/L (ref 135–145)

## 2021-12-04 LAB — GLUCOSE, CAPILLARY
Glucose-Capillary: 315 mg/dL — ABNORMAL HIGH (ref 70–99)
Glucose-Capillary: 313 mg/dL — ABNORMAL HIGH (ref 70–99)
Glucose-Capillary: 379 mg/dL — ABNORMAL HIGH (ref 70–99)
Glucose-Capillary: 101 mg/dL — ABNORMAL HIGH (ref 70–99)

## 2021-12-04 LAB — CBC
HCT: 28.3 % — ABNORMAL LOW (ref 39.0–52.0)
Hemoglobin: 9.2 g/dL — ABNORMAL LOW (ref 13.0–17.0)
MCH: 30 pg (ref 26.0–34.0)
MCHC: 32.5 g/dL (ref 30.0–36.0)
MCV: 92.2 fL (ref 80.0–100.0)
Platelets: 199 10*3/uL (ref 150–400)
RBC: 3.07 MIL/uL — ABNORMAL LOW (ref 4.22–5.81)
RDW: 14.2 % (ref 11.5–15.5)
WBC: 12.5 10*3/uL — ABNORMAL HIGH (ref 4.0–10.5)
nRBC: 0 % (ref 0.0–0.2)

## 2021-12-04 LAB — MAGNESIUM: Magnesium: 2.3 mg/dL (ref 1.7–2.4)

## 2021-12-04 MED ORDER — POTASSIUM CHLORIDE CRYS ER 20 MEQ PO TBCR
40.0000 meq | EXTENDED_RELEASE_TABLET | Freq: Once | ORAL | Status: AC
  Administered 2021-12-04: 40 meq via ORAL
  Filled 2021-12-04: qty 2

## 2021-12-04 MED ORDER — SODIUM CHLORIDE 0.9% FLUSH: 10.0000 mL | INTRAVENOUS | Status: DC | PRN

## 2021-12-04 MED ORDER — SODIUM CHLORIDE 0.9% FLUSH
10.0000 mL | Freq: Two times a day (BID) | INTRAVENOUS | Status: DC
  Administered 2021-12-04 – 2021-12-06 (×5): 10 mL

## 2021-12-04 MED ORDER — FUROSEMIDE 40 MG PO TABS
40.0000 mg | ORAL_TABLET | Freq: Every day | ORAL | Status: DC
  Administered 2021-12-04 – 2021-12-06 (×3): 40 mg via ORAL
  Filled 2021-12-04 (×3): qty 1

## 2021-12-04 NOTE — Progress Notes (Signed)
FPTS Interim Progress Note ? ?Spoke with PCCM, patient improving from a respiratory standpoint and weaning well. Down to HFNC 6L, stable to transfer off of unit. Encourage compliance, continue breo, duo nebs, CAP coverage. RVP + paraflu, could be contributing. Aggressively diuresed in ICU. CCM will not follow unless we feel necessary.  ? ?Overall, stable to come back to our service. Will pick up 0700 12/05/21.  ? ? ?Erskine Emery, MD ?12/04/2021, 1:33 PM ?PGY-1, [place] ?Service pager [phone] ? ?

## 2021-12-04 NOTE — Progress Notes (Signed)
?  Echocardiogram ?2D Echocardiogram has been performed. ? ?Gerda Diss ?12/04/2021, 10:01 AM ?

## 2021-12-04 NOTE — Progress Notes (Signed)
Patient brought to 4E from 2H. Telemetry box applied, CCMD notified. VSS. Patient oriented to room and staff. Call bell in reach. ? ?Kenard Gower, RN  ?

## 2021-12-04 NOTE — Consult Note (Addendum)
? ?NAME:  Rodney Barber, MRN:  [id number], DOB:  12-15-1965, LOS: 2 ?ADMISSION DATE:  12/02/2021, CONSULTATION DATE:  12/02/21 ?REFERRING MD:  Hensel - FPTS, CHIEF COMPLAINT:  SOB hypoxia   ? ?History of Present Illness:  ? ?56 yo M PMH substance use disorder, HTN, pancreatitis, homelessness, CAD, combined systolic and diastolic HF, tobacco abuse, DM2presented to [facility] 4/28 with diarrhea, vomiting, dizziness, and cough productive pink thick sputum. Initially SOB or chest pain, but was found to be hypoxic with spo2 69% on arrival, requiring NRB. Cxr with multifocal PNA. Admitted to FPTS for further management and care.  ?Over course of hospital day 1, pt has decompensated from respiratory perspective, with increasing O2 requirement and progressive cough, marked increase WOB requiring HHFNC 30L/100% + 15L NRB, with improvement in SpO2 from 80s to low 90s. He has had progressive associated tachypnea.  ? ?PCCM is consulted for evaluation in this setting  ? ?Pertinent  Medical History  ?CAD ?Systolic and diastolic HF ?HTN ?HLD ?Tobacco abuse  ?Substance use disorder  ? ?Significant Hospital Events: ?Including procedures, antibiotic start and stop dates in addition to other pertinent events   ?4/28 admit to FPTS after presenting with dizziness, vomiting diarrhea.  Profoundly hypoxic and with multifocal PNA. PCCM consult due to escalating O2 needs  ?4/29 Transferred to ICU overnight for worsening respiratory failure. Diuresed ?4/30 Weaned to HFNC ? ?Interim History / Subjective:  ?Diuresed well ?Weaned to HFNC ? ?Objective   ?Blood pressure (!) 143/66, pulse 67, temperature 98.1 ?F (36.7 ?C), temperature source Oral, resp. rate (!) 22, height 5\' 8"  (1.727 m), weight 57.1 kg, SpO2 96 %. ?   ?FiO2 (%):  [90 %] 90 %  ? ?Intake/Output Summary (Last 24 hours) at 12/04/2021 1328 ?Last data filed at 12/04/2021 0730 ?Gross per 24 hour  ?Intake 255.97 ml  ?Output 800 ml  ?Net -544.03 ml  ? ?Filed Weights  ? 12/02/21 0752 12/03/21 0500  12/04/21 0500  ?Weight: 63 kg 63.2 kg 57.1 kg  ? ?Physical Exam: ?General: Well-appearing, no acute distress ?HENT: , AT, OP clear, MMM ?Eyes: EOMI, no scleral icterus ?Respiratory: Clear to auscultation bilaterally.  No crackles, wheezing or rales ?Cardiovascular: RRR, -M/R/G, no JVD ?GI: BS+, soft, nontender ?Extremities:-Edema,-tenderness ?Neuro: AAO x4, CNII-XII grossly intact ? ?Resolved Hospital Problem list   ?Sepsis ? ?Assessment & Plan:  ?Principal Problem: ?  Community acquired pneumonia ?Active Problems: ?  Hyperlipidemia associated with type 2 diabetes mellitus (HCC) ?  AKI (acute kidney injury) (HCC) ?  Tobacco abuse ?  Acute respiratory failure with hypoxia (HCC) ?  Chronic combined systolic (congestive) and diastolic (congestive) heart failure (HCC) ?  Essential hypertension ?  Coronary artery disease ?  Sepsis (HCC) ?  Nephrolithiasis ? ? ?Acute respiratory failure with hypoxia 2/2 Parainfluenza PNA +/- CAP +/- Pulmonary edema  ?Superimposed on Tobacco abuse & prob COPD  ?Plan ?Improving. Weaned to HFNC ?Goal SpO2 >88% ?Continue CAP coverage D3 ?Discontinue ?Scheduled BD ?Continue Breo daily ?RVP and Urine strep neg. F/u urine legionella ?Sputum if able  ?Cough suppression w/ tussinex and morphine  ? ?AKI superimposed on Nephrolithiasis - stable Cr ?-op plan for ureteroscopy possible stent  ?Now creatine 1.84 > baseline 1.3 range ?Plan ?Renal dose meds ?OP f/u w/ urology  ? ?CAD ?Acute on chronic combined systolic and diastolic HF ?HTN ?Plan ?-home coreg, spiro, imdur, amlodipine, valsartan  ?-this admit BNP 562 ?Plan ?Change lasix to 40 mg PO daily ?Holding his coreg, imdur, amlodipine, valsartan ?Cont stain  and asa ? ?DM2 with polyneuropathy  ?Plan ?Ssi  ? ?HLD ?Plan ?atorvastatin  ? ?Updated patient on plan. Contacted FMTS. Will transfer to floor and FMTS to pick up 5/1 ? ?Best Practice (right click and "Reselect all SmartList Selections" daily)  ? ?Diet/type: NPO w/ oral meds D5NS ?DVT  prophylaxis: LMWH ?GI prophylaxis: N/A ?Lines: N/A ?Foley:  N/A ?Code Status:  full code ?Last date of multidisciplinary goals of care discussion [pending] ? ?Labs   ?CBC: ?Recent Labs  ?Lab 12/02/21 ?0800 12/03/21 ?0125 12/04/21 ?0145  ?WBC 11.7* 13.4* 12.5*  ?NEUTROABS 9.7*  --   --   ?HGB 9.0* 10.0* 9.2*  ?HCT 28.5* 29.9* 28.3*  ?MCV 95.3 91.7 92.2  ?PLT 195 196 199  ? ? ?Basic Metabolic Panel: ?Recent Labs  ?Lab 12/02/21 ?0800 12/03/21 ?0125 12/04/21 ?0145  ?NA 137 141 137  ?K 4.3 3.7 3.7  ?CL 111 114* 108  ?CO2 19* 21* 23  ?GLUCOSE 186* 85 232*  ?BUN 25* 27* 34*  ?CREATININE 1.84* 1.84* 1.83*  ?CALCIUM 8.1* 8.1* 7.6*  ?MG  --  2.0 2.3  ? ?GFR: ?Estimated Creatinine Clearance: 36 mL/min (A) (by C-G formula based on SCr of 1.83 mg/dL (H)). ?Recent Labs  ?Lab 12/02/21 ?0800 12/02/21 ?1703 12/03/21 ?0125 12/04/21 ?0145  ?PROCALCITON  --  0.20  --   --   ?WBC 11.7*  --  13.4* 12.5*  ?LATICACIDVEN 1.4  --   --   --   ? ? ?Liver Function Tests: ?Recent Labs  ?Lab 12/02/21 ?0800 12/03/21 ?0125  ?AST 50* 130*  ?ALT 22 30  ?ALKPHOS 78 66  ?BILITOT 0.3 0.5  ?PROT 6.2* 6.0*  ?ALBUMIN 3.0* 2.7*  ? ?No results for input(s): LIPASE, AMYLASE in the last 168 hours. ?No results for input(s): AMMONIA in the last 168 hours. ? ?ABG ?   ?Component Value Date/Time  ? PHART 7.256 (L) 12/28/2019 0752  ? PCO2ART 33.8 12/28/2019 0752  ? PO2ART 120 (H) 12/28/2019 0752  ? HCO3 24.1 01/14/2020 2025  ? TCO2 16 (L) 12/28/2019 0752  ? ACIDBASEDEF 2.1 (H) 01/14/2020 2025  ? O2SAT 37.1 01/14/2020 2025  ?  ? ?Coagulation Profile: ?No results for input(s): INR, PROTIME in the last 168 hours. ? ?Cardiac Enzymes: ?No results for input(s): CKTOTAL, CKMB, CKMBINDEX, TROPONINI in the last 168 hours. ? ?HbA1C: ?Hemoglobin A1C  ?Date/Time Value Ref Range Status  ?02/21/2021 09:39 AM 8.7 (A) 4.0 - 5.6 % Final  ? ?HbA1c, POC (controlled diabetic range)  ?Date/Time Value Ref Range Status  ?09/27/2021 02:39 PM 8.1 (A) 0.0 - 7.0 % Final  ?11/15/2020 02:24 PM  9.6 (A) 0.0 - 7.0 % Final  ? ?Hgb A1c MFr Bld  ?Date/Time Value Ref Range Status  ?12/04/2021 01:45 AM 7.9 (H) 4.8 - 5.6 % Final  ?  Comment:  ?  (NOTE) ?Pre diabetes:          5.7%-6.4% ? ?Diabetes:              >6.4% ? ?Glycemic control for   <7.0% ?adults with diabetes ?  ? ? ?CBG: ?Recent Labs  ?Lab 12/03/21 ?1550 12/03/21 ?2138 12/03/21 ?2220 12/04/21 ?4650 12/04/21 ?1220  ?GLUCAP 134* 387* 407* 315* 313*  ? ? ?Critical care time: N/A ?  ?Care Time: 50 min ?Mechele Collin, M.D. ?[facility] ?12/04/2021 1:28 PM  ? ?See Amion for personal pager ?For hours between 7 PM to 7 AM, please call Elink for urgent questions ? ? ? ?

## 2021-12-05 DIAGNOSIS — I2510 Atherosclerotic heart disease of native coronary artery without angina pectoris: Secondary | ICD-10-CM

## 2021-12-05 LAB — BASIC METABOLIC PANEL
Anion gap: 8 (ref 5–15)
BUN: 31 mg/dL — ABNORMAL HIGH (ref 6–20)
CO2: 21 mmol/L — ABNORMAL LOW (ref 22–32)
Calcium: 7.3 mg/dL — ABNORMAL LOW (ref 8.9–10.3)
Chloride: 110 mmol/L (ref 98–111)
Creatinine, Ser: 1.5 mg/dL — ABNORMAL HIGH (ref 0.61–1.24)
GFR, Estimated: 54 mL/min — ABNORMAL LOW (ref >60)
Glucose, Bld: 178 mg/dL — ABNORMAL HIGH (ref 70–99)
Potassium: 3.7 mmol/L (ref 3.5–5.1)
Sodium: 139 mmol/L (ref 135–145)

## 2021-12-05 LAB — GLUCOSE, CAPILLARY
Glucose-Capillary: 173 mg/dL — ABNORMAL HIGH (ref 70–99)
Glucose-Capillary: 224 mg/dL — ABNORMAL HIGH (ref 70–99)
Glucose-Capillary: 203 mg/dL — ABNORMAL HIGH (ref 70–99)
Glucose-Capillary: 136 mg/dL — ABNORMAL HIGH (ref 70–99)

## 2021-12-05 LAB — MAGNESIUM: Magnesium: 2.3 mg/dL (ref 1.7–2.4)

## 2021-12-05 MED ORDER — INSULIN GLARGINE-YFGN 100 UNIT/ML ~~LOC~~ SOLN
15.0000 [IU] | Freq: Every day | SUBCUTANEOUS | Status: DC
Start: 2021-12-06 — End: 2021-12-06
  Administered 2021-12-06: 15 [IU] via SUBCUTANEOUS
  Filled 2021-12-05: qty 0.15

## 2021-12-05 MED ORDER — INSULIN GLARGINE-YFGN 100 UNIT/ML ~~LOC~~ SOLN
3.0000 [IU] | Freq: Every day | SUBCUTANEOUS | Status: AC
  Administered 2021-12-05: 3 [IU] via SUBCUTANEOUS
  Filled 2021-12-05: qty 0.03

## 2021-12-05 MED ORDER — CARVEDILOL 25 MG PO TABS: 25.0000 mg | ORAL_TABLET | Freq: Two times a day (BID) | ORAL | Status: DC

## 2021-12-05 MED ORDER — CARVEDILOL 12.5 MG PO TABS: 12.5000 mg | ORAL_TABLET | Freq: Once | ORAL | Status: DC

## 2021-12-05 MED ORDER — CARVEDILOL 12.5 MG PO TABS
12.5000 mg | ORAL_TABLET | Freq: Two times a day (BID) | ORAL | Status: DC
  Administered 2021-12-05 – 2021-12-06 (×3): 12.5 mg via ORAL
  Filled 2021-12-05 (×3): qty 1

## 2021-12-05 NOTE — Progress Notes (Signed)
Mobility Specialist Progress Note ? ? 12/05/21 1157  ?Mobility  ?Activity Refused mobility  ? ?Pt requesting for MS to come back in the afternoon d/t tiredness. Will f/u later if time permits. ? ?Holland Falling ?Mobility Specialist ?Phone Number [phone] ? ?

## 2021-12-05 NOTE — Progress Notes (Signed)
Family Medicine Teaching Service ?Daily Progress Note ?Intern Pager: [phone] ? ?Patient name: Rodney Barber Medical record number: [id number] ?Date of birth: 05-18-1966 Age: 56 y.o. Gender: male ? ?Primary Care Provider: [name], MD ?Consultants: PCCM ?Code Status: Full Code  ? ?Pt Overview and Major Events to Date:  ?4/28 admit to FPTS after presenting with dizziness, vomiting diarrhea.  Profoundly hypoxic and with multifocal PNA. PCCM consult due to escalating O2 needs  ?4/29 Transferred to ICU overnight for worsening respiratory failure. Diuresed ?5/1 Returned to floor and FMTS ? ?Assessment and Plan: ?Rodney Barber is a 56 y.o. male presenting with 1 day of fatigue, generalized weakness, hypoxic was found to multilobar pneumonia on CXR. 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 ?HD 3  ? ?Acute respiratory failure with hypoxia 2/2 Parainfluenza PNA +/- CAP +/- Pulmonary edema  ?Superimposed on Tobacco abuse & likely COPD - resolved ?On room air currently from 6L [place]. WBC downtrending.  ?- Azithromycin and Rocephin, last dose on 5/2 ?- Continue to monitor vital signs ?- Breo daily ?- SPO2 goal >88% ?- CBC ? 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 Cr at baseline, 1.3-1.5 as our best guess. Euvolemic on exam. K wnl.  ?- Restarted home coreg 12.5 BID, considering to increase 5/2 if BP tolerated  ?- Continued home lasix 40mg  daily ?- I/Os, daily wts ? ?AKI, improved ?Cr per above ? ?DM Type 2 ?27u short acting insulin, fasting glc 173. CBG was 300s yesterday ?- additional 3u for total 15u semglee today, then semglee 15u daily ? ?CAD, HLD ?- asa, atorvastatin 40 ? ? ?FEN/GI: Clear liquid  ?PPx: enoxaparin (LOVENOX) injection 40 mg Start: 12/02/21 1100  ?Dispo:Pending PT recommendations . Barriers include pending clinical improvement .  ? ?Subjective:  ?No family at bedside.  ? ?Rossie was seen this AM. Stated that his breathing is much better and has no acute concerns.   ? ?Objective: ?Temp:  [97.7 ?F (36.5 ?C)-98.3 ?F (36.8 ?C)] 98.3 ?F (36.8 ?C) (05/01 1147) ?Pulse Rate:  [64-76] 73 (05/01 1147) ?Resp:  [12-23] 18 (05/01 1147) ?BP: (134-164)/(53-93) 157/53 (05/01 1147) ?SpO2:  [91 %-100 %] 91 % (05/01 1200) ?Physical Exam ?Vitals and nursing note reviewed.  ?Constitutional:   ?   General: He is not in acute distress. ?   Appearance: He is not toxic-appearing.  ?   Comments: Sleep, easily awakened for exam  ?HENT:  ?   Head: Normocephalic and atraumatic.  ?Cardiovascular:  ?   Rate and Rhythm: Normal rate.  ?   Heart sounds: No murmur heard. ?  No gallop.  ?Pulmonary:  ?   Effort: Pulmonary effort is normal.  ?   Breath sounds: Wheezing (Expiratory, diffused) and rales (RLL) present.  ?Abdominal:  ?   General: Abdomen is flat. There is no distension.  ?   Palpations: Abdomen is soft.  ?   Tenderness: There is no abdominal tenderness. There is no guarding.  ?Musculoskeletal:  ?   Right lower leg: No edema.  ?   Left lower leg: No edema.  ?Skin: ?   General: Skin is warm and dry.  ?Neurological:  ?   Mental Status: He is oriented to person, place, and time.  ?  ? ?Laboratory: ?Recent Labs  ?Lab 12/02/21 ?0800 12/03/21 ?0125 12/04/21 ?0145  ?WBC 11.7* 13.4* 12.5*  ?HGB 9.0* 10.0* 9.2*  ?HCT 28.5* 29.9* 28.3*  ?PLT 195 196 199  ? ?Recent Labs  ?Lab 12/02/21 ?0800 12/03/21 ?0125 12/04/21 ?  0145 12/05/21 ?0216  ?NA 137 141 137 139  ?K 4.3 3.7 3.7 3.7  ?CL 111 114* 108 110  ?CO2 19* 21* 23 21*  ?BUN 25* 27* 34* 31*  ?CREATININE 1.84* 1.84* 1.83* 1.50*  ?CALCIUM 8.1* 8.1* 7.6* 7.3*  ?PROT 6.2* 6.0*  --   --   ?BILITOT 0.3 0.5  --   --   ?ALKPHOS 78 66  --   --   ?ALT 22 30  --   --   ?AST 50* 130*  --   --   ?GLUCOSE 186* 85 232* 178*  ? ?Results for orders placed or performed during the hospital encounter of 12/02/21 (from the past 24 hour(s))  ?Glucose, capillary     Status: Abnormal  ? Collection Time: 12/04/21  4:34 PM  ?Result Value Ref Range  ? Glucose-Capillary 379 (H) 70 - 99 mg/dL   ?Glucose, capillary     Status: Abnormal  ? Collection Time: 12/04/21  9:24 PM  ?Result Value Ref Range  ? Glucose-Capillary 101 (H) 70 - 99 mg/dL  ? Comment 1 Notify RN   ?Basic metabolic panel     Status: Abnormal  ? Collection Time: 12/05/21  2:16 AM  ?Result Value Ref Range  ? Sodium 139 135 - 145 mmol/L  ? Potassium 3.7 3.5 - 5.1 mmol/L  ? Chloride 110 98 - 111 mmol/L  ? CO2 21 (L) 22 - 32 mmol/L  ? Glucose, Bld 178 (H) 70 - 99 mg/dL  ? BUN 31 (H) 6 - 20 mg/dL  ? Creatinine, Ser 1.50 (H) 0.61 - 1.24 mg/dL  ? Calcium 7.3 (L) 8.9 - 10.3 mg/dL  ? GFR, Estimated 54 (L) >60 mL/min  ? Anion gap 8 5 - 15  ?Magnesium     Status: None  ? Collection Time: 12/05/21  2:16 AM  ?Result Value Ref Range  ? Magnesium 2.3 1.7 - 2.4 mg/dL  ?Glucose, capillary     Status: Abnormal  ? Collection Time: 12/05/21  6:33 AM  ?Result Value Ref Range  ? Glucose-Capillary 173 (H) 70 - 99 mg/dL  ?Glucose, capillary     Status: Abnormal  ? Collection Time: 12/05/21 11:46 AM  ?Result Value Ref Range  ? Glucose-Capillary 224 (H) 70 - 99 mg/dL  ? ? ?Imaging/Diagnostic Tests: ?No results found.  ? ?Princess Bruins, DO ?12/05/2021, 3:51 PM ?PGY-1, [facility] ?FPTS Intern pager: [phone], text pages welcome  ?

## 2021-12-05 NOTE — Plan of Care (Signed)
?  Problem: Education: ?Goal: Knowledge of General Education information will improve ?Description: Including pain rating scale, medication(s)/side effects and non-pharmacologic comfort measures ?Outcome: Progressing ?  ?Problem: Health Behavior/Discharge Planning: ?Goal: Ability to manage health-related needs will improve ?Outcome: Progressing ?  ?Problem: Clinical Measurements: ?Goal: Ability to maintain clinical measurements within normal limits will improve ?Outcome: Progressing ?Goal: Will remain free from infection ?Outcome: Progressing ?Goal: Diagnostic test results will improve ?Outcome: Progressing ?Goal: Respiratory complications will improve ?Outcome: Progressing ?Goal: Cardiovascular complication will be avoided ?Outcome: Progressing ?  ?Problem: Skin Integrity: ?Goal: Risk for impaired skin integrity will decrease ?Outcome: Progressing ?  ?Problem: Education: ?Goal: Knowledge of General Education information will improve ?Description: Including pain rating scale, medication(s)/side effects and non-pharmacologic comfort measures ?Outcome: Progressing ?  ?Problem: Clinical Measurements: ?Goal: Ability to maintain clinical measurements within normal limits will improve ?Outcome: Progressing ?Goal: Will remain free from infection ?Outcome: Progressing ?Goal: Diagnostic test results will improve ?Outcome: Progressing ?Goal: Respiratory complications will improve ?Outcome: Progressing ?Goal: Cardiovascular complication will be avoided ?Outcome: Progressing ?  ?Problem: Nutrition: ?Goal: Adequate nutrition will be maintained ?Outcome: Progressing ?  ?Problem: Skin Integrity: ?Goal: Risk for impaired skin integrity will decrease ?Outcome: Progressing ?  ?

## 2021-12-05 NOTE — Evaluation (Signed)
Physical Therapy Evaluation ?Patient Details ?Name: Rodney Barber ?MRN: [id number] ?DOB: 01/25/1966 ?Today's Date: 12/05/2021 ? ?History of Present Illness ? Patient is a 56 y/o male who presents on 4/28 with weakness, dizziness, vomiting/diarrhea; found to be hypoxic. Admitted with Acute respiratory failure with hypoxia 2/2 Parainfluenza PNA superimposed on Tobacco abuse & likely COPD.  PMH includes homesless, HTN, DM, alcoholic pancreatitis, HF, EF 30-86%, CAD.  ?Clinical Impression ? Patient is independent and uses rollator for ambulation, lives at [facility]. Today, pt tolerated transfers and ambulation mod I with and without use of DME. Interested in [facility] for when he has to take the bus and go to appts as it is cumbersome to travel with rollator. Sp02 remained in mid 90s on RA throughout activity. Pt reports feeling 80% of his baseline and does not require skilled therapy services. Encouraged pt to keep working with mobility team and walking as able. All education completed. Discharge from therapy. ? ?   ? ?Recommendations for follow up therapy are one component of a multi-disciplinary discharge planning process, led by the attending physician.  Recommendations may be updated based on patient status, additional functional criteria and insurance authorization. ? ?Follow Up Recommendations No PT follow up ? ?  ?Assistance Recommended at Discharge PRN  ?Patient can return home with the following ? Help with stairs or ramp for entrance ? ?  ?Equipment Recommendations Gil[place] Mor  ?Recommendations for Other Services ?    ?  ?Functional Status Assessment Patient has not had a recent decline in their functional status  ? ?  ?Precautions / Restrictions Precautions ?Precautions: None ?Restrictions ?Weight Bearing Restrictions: No  ? ?  ? ?Mobility ? Bed Mobility ?Overal bed mobility: Independent ?  ?  ?  ?  ?  ?  ?  ?  ? ?Transfers ?Overall transfer level: Independent ?Equipment used: None ?  ?  ?  ?  ?  ?  ?  ?  ?   ? ?Ambulation/Gait ?Ambulation/Gait assistance: Modified independent (Device/Increase time) ?Gait Distance (Feet): 50 Feet ?Assistive device: None ?Gait Pattern/deviations: Step-through pattern, Decreased stride length ?Gait velocity: decreased ?  ?  ?General Gait Details: Slow steady gait without use of DME, uses rollator at baseline but requesting a cane. No evidence of imbalance with turns ? ?Stairs ?  ?  ?  ?  ?  ? ?Wheelchair Mobility ?  ? ?Modified Rankin (Stroke Patients Only) ?  ? ?  ? ?Balance Overall balance assessment: Modified Independent ?  ?  ?  ?  ?  ?  ?  ?  ?  ?  ?  ?  ?  ?  ?  ?  ?  ?  ?   ? ? ? ?Pertinent Vitals/Pain Pain Assessment ?Pain Assessment: No/denies pain  ? ? ?Home Living Family/patient expects to be discharged to:: Private residence ?Living Arrangements: Other (Comment) [name]) ?Available Help at Discharge: Other (Comment) ?Type of Home: House ?Home Access: Level entry ?  ?  ?  ?Home Layout: One level ?Home Equipment: Rollator (4 wheels) ?Additional Comments: stays at [facility]  ?  ?Prior Function Prior Level of Function : Independent/Modified Independent ?  ?  ?  ?  ?  ?  ?Mobility Comments: Independent, no falls in last 6 months ?ADLs Comments: independent, does some IADLs ?  ? ? ?Hand Dominance  ? Dominant Hand: Right ? ?  ?Extremity/Trunk Assessment  ? Upper Extremity Assessment ?Upper Extremity Assessment: Defer to OT evaluation ?  ? ?Lower  Extremity Assessment ?Lower Extremity Assessment: Overall WFL for tasks assessed ?  ? ?Cervical / Trunk Assessment ?Cervical / Trunk Assessment: Normal  ?Communication  ? Communication: No difficulties  ?Cognition Arousal/Alertness: Awake/alert ?Behavior During Therapy: [facility] for tasks assessed/performed ?Overall Cognitive Status: Within Functional Limits for tasks assessed ?  ?  ?  ?  ?  ?  ?  ?  ?  ?  ?  ?  ?  ?  ?  ?  ?  ?  ?  ? ?  ?General Comments General comments (skin integrity, edema, etc.): Sp02 mid 90s on RA with  activity. ? ?  ?Exercises    ? ?Assessment/Plan  ?  ?PT Assessment Patient does not need any further PT services  ?PT Problem List   ? ?   ?  ?PT Treatment Interventions     ? ?PT Goals (Current goals can be found in the Care Plan section)  ?Acute Rehab PT Goals ?Patient Stated Goal: to go home ?PT Goal Formulation: All assessment and education complete, DC therapy ? ?  ?Frequency   ?  ? ? ?Co-evaluation   ?  ?  ?  ?  ? ? ?  ?AM-PAC PT "6 Clicks" Mobility  ?Outcome Measure Help needed turning from your back to your side while in a flat bed without using bedrails?: None ?Help needed moving from lying on your back to sitting on the side of a flat bed without using bedrails?: None ?Help needed moving to and from a bed to a chair (including a wheelchair)?: None ?Help needed standing up from a chair using your arms (e.g., wheelchair or bedside chair)?: None ?Help needed to walk in hospital room?: None ?Help needed climbing 3-5 steps with a railing? : A Lot ?6 Click Score: 22 ? ?  ?End of Session   ?Activity Tolerance: Patient tolerated treatment well ?Patient left: in bed;with call bell/phone within reach ?Nurse Communication: Mobility status ?PT Visit Diagnosis: Unsteadiness on feet (R26.81) ?  ? ?Time: 1610-9604 ?PT Time Calculation (min) (ACUTE ONLY): 14 min ? ? ?Charges:   PT Evaluation ?$PT Eval Low Complexity: 1 Low ?  ?  ?   ? ? ?Vale Haven, PT, DPT ?Acute Rehabilitation Services ?Secure chat preferred ?Office [phone] ? ? ? ? ?Rodney Barber ?12/05/2021, 4:06 PM ? ?

## 2021-12-05 NOTE — Progress Notes (Addendum)
Mobility Specialist Progress Note ? ? 12/05/21 1454  ?Mobility  ?Activity Ambulated independently in hallway  ?Level of Assistance Modified independent, requires aide device or extra time  ?Assistive Device Front wheel walker  ?Distance Ambulated (ft) 470 ft  ?Activity Response Tolerated well  ?$Mobility charge 1 Mobility  ? ?Pre Mobility: 67 HR, 158/77 BP, 98% SpO2 on 2LO2 ?During Mobility: 86 HR, 90% SpO2 on RA  ?Post Mobility: 71 HR, 155/73 BP, 95% SpO2 on RA ? ?Received pt on 2LO2 in bed having no complaints and agreeable to mobility. Asymptomatic throughout ambulation while on RA and SpO2 remained >90%, returned back to bed and kept on RA, pt sitting comfortably at 95%,  Notified RN and placed call bell in reach. ? ?[name] ?Mobility Specialist ?Phone Number [phone] ? ?

## 2021-12-06 ENCOUNTER — Other Ambulatory Visit: Payer: Self-pay

## 2021-12-06 DIAGNOSIS — E785 Hyperlipidemia, unspecified: Secondary | ICD-10-CM

## 2021-12-06 DIAGNOSIS — Z720 Tobacco use: Secondary | ICD-10-CM

## 2021-12-06 DIAGNOSIS — I10 Essential (primary) hypertension: Secondary | ICD-10-CM

## 2021-12-06 DIAGNOSIS — E1169 Type 2 diabetes mellitus with other specified complication: Secondary | ICD-10-CM

## 2021-12-06 DIAGNOSIS — N200 Calculus of kidney: Secondary | ICD-10-CM

## 2021-12-06 LAB — BASIC METABOLIC PANEL
Anion gap: 6 (ref 5–15)
BUN: 22 mg/dL — ABNORMAL HIGH (ref 6–20)
CO2: 25 mmol/L (ref 22–32)
Calcium: 7.8 mg/dL — ABNORMAL LOW (ref 8.9–10.3)
Chloride: 109 mmol/L (ref 98–111)
Creatinine, Ser: 1.33 mg/dL — ABNORMAL HIGH (ref 0.61–1.24)
GFR, Estimated: >60 mL/min (ref >60)
Glucose, Bld: 160 mg/dL — ABNORMAL HIGH (ref 70–99)
Potassium: 3.9 mmol/L (ref 3.5–5.1)
Sodium: 140 mmol/L (ref 135–145)

## 2021-12-06 LAB — MAGNESIUM: Magnesium: 2.2 mg/dL (ref 1.7–2.4)

## 2021-12-06 LAB — LEGIONELLA PNEUMOPHILA SEROGP 1 UR AG: L. pneumophila Serogp 1 Ur Ag: NEGATIVE

## 2021-12-06 LAB — GLUCOSE, CAPILLARY
Glucose-Capillary: 89 mg/dL (ref 70–99)
Glucose-Capillary: 81 mg/dL (ref 70–99)

## 2021-12-06 MED ORDER — ACETAMINOPHEN 325 MG PO TABS: 650.0000 mg | ORAL_TABLET | Freq: Four times a day (QID) | ORAL | Status: AC | PRN

## 2021-12-06 MED ORDER — FLUTICASONE FUROATE-VILANTEROL 200-25 MCG/ACT IN AEPB
1.0000 | INHALATION_SPRAY | Freq: Every day | RESPIRATORY_TRACT | 0 refills | Status: DC
  Filled 2021-12-06: qty 28, 28d supply, fill #0

## 2021-12-06 MED ORDER — VALSARTAN 40 MG PO TABS
40.0000 mg | ORAL_TABLET | Freq: Every day | ORAL | 0 refills | Status: DC
  Filled 2021-12-06: qty 30, 30d supply, fill #0

## 2021-12-06 MED ORDER — ALUM & MAG HYDROXIDE-SIMETH 200-200-20 MG/5ML PO SUSP: 15.0000 mL | Freq: Four times a day (QID) | ORAL | Status: DC | PRN

## 2021-12-06 MED ORDER — FUROSEMIDE 40 MG PO TABS
40.0000 mg | ORAL_TABLET | Freq: Every day | ORAL | 0 refills | Status: DC
  Filled 2021-12-06: qty 30, 30d supply, fill #0

## 2021-12-06 MED ORDER — MOMETASONE FURO-FORMOTEROL FUM 100-5 MCG/ACT IN AERO
2.0000 | INHALATION_SPRAY | Freq: Two times a day (BID) | RESPIRATORY_TRACT | Status: DC
  Filled 2021-12-06: qty 8.8

## 2021-12-06 MED ORDER — INSULIN GLARGINE 100 UNIT/ML SOLOSTAR PEN
15.0000 [IU] | PEN_INJECTOR | Freq: Every day | SUBCUTANEOUS | 0 refills | Status: DC
  Filled 2021-12-06: qty 9, 60d supply, fill #0

## 2021-12-06 MED ORDER — CARVEDILOL 12.5 MG PO TABS
12.5000 mg | ORAL_TABLET | Freq: Two times a day (BID) | ORAL | 0 refills | Status: DC
  Filled 2021-12-06: qty 60, 30d supply, fill #0

## 2021-12-06 MED ORDER — MOMETASONE FURO-FORMOTEROL FUM 100-5 MCG/ACT IN AERO
2.0000 | INHALATION_SPRAY | Freq: Two times a day (BID) | RESPIRATORY_TRACT | 0 refills | Status: DC
  Filled 2021-12-06: qty 13, 30d supply, fill #0

## 2021-12-06 MED ORDER — CARVEDILOL 25 MG PO TABS
25.0000 mg | ORAL_TABLET | Freq: Two times a day (BID) | ORAL | Status: DC
Start: 2021-12-06 — End: 2021-12-06

## 2021-12-06 MED ORDER — FLUTICASONE FUROATE-VILANTEROL 200-25 MCG/ACT IN AEPB
1.0000 | INHALATION_SPRAY | Freq: Every day | RESPIRATORY_TRACT | 0 refills | Status: DC
  Filled 2021-12-06: qty 30, 30d supply, fill #0

## 2021-12-06 MED ORDER — NICOTINE 14 MG/24HR TD PT24: 14.0000 mg | MEDICATED_PATCH | Freq: Every day | TRANSDERMAL | 0 refills | Status: DC

## 2021-12-06 NOTE — Discharge Summary (Signed)
Family Medicine Teaching Service ?Hospital Discharge Summary ? ?Patient name: Rodney Barber Medical record number: [id number] ?Date of birth: 1966/03/15 Age: 56 y.o. Gender: male ?Date of Admission: 12/02/2021  Date of Discharge: 12/06/2021 ?Admitting Physician: Zenia Resides, MD ? ?Primary Care Provider: Elsie Stain, MD ?Consultants: PCCM ? ?Indication for Hospitalization:  ?Acute respiratory failure with hypoxia 2/2 Parainfluenza PNA +/- CAP +/- Pulmonary edema  ?Superimposed on Tobacco abuse & likely COPD  ?  ? ?Discharge Diagnoses/Problem List:  ?Principal Problem: ?  Community acquired pneumonia ?Active Problems: ?  Hyperlipidemia associated with type 2 diabetes mellitus ([place]) ?  AKI (acute kidney injury) ([place]) ?  Tobacco abuse ?  Acute respiratory failure with hypoxia ([place]) ?  Chronic combined systolic (congestive) and diastolic (congestive) heart failure (HCC) ?  Essential hypertension ?  Coronary artery disease ?  Sepsis ([place]) ?  Nephrolithiasis ?  ? ?Disposition:  ?Home, no PT/OT ?DME Cane ?   ?Discharge Condition:  ?Stable  ? ?Discharge Exam:  ?Temp:  [97.8 ?F (36.6 ?C)-98.8 ?F (37.1 ?C)] 97.9 ?F (36.6 ?C) (05/02 0800) ?Pulse Rate:  [65-74] 69 (05/02 0800) ?Resp:  [17-18] 18 (05/02 0800) ?BP: (159-167)/(69-77) 161/76 (05/02 0800) ?SpO2:  [92 %-100 %] 92 % (05/02 0800) ?Weight:  [57.9 kg] 57.9 kg (05/02 0449)  ?Physical Exam ?Vitals and nursing note reviewed.  ?Constitutional:   ?   General: He is awake. He is not in acute distress. ?   Appearance: He is not ill-appearing, toxic-appearing or diaphoretic.  ?HENT:  ?   Head: Normocephalic and atraumatic.  ?Cardiovascular:  ?   Rate and Rhythm: Normal rate and regular rhythm.  ?   Heart sounds: Normal heart sounds. No murmur heard. ?  No friction rub. No gallop.  ?Pulmonary:  ?   Effort: Pulmonary effort is normal. No respiratory distress.  ?   Breath sounds: Wheezing (expiratory) present. No rales.  ?Abdominal:  ?   General: Bowel sounds are normal.  ?    Palpations: Abdomen is soft.  ?   Tenderness: There is no abdominal tenderness. There is no guarding or rebound.  ?Musculoskeletal:  ?   Right lower leg: No edema.  ?   Left lower leg: No edema.  ?Skin: ?   General: Skin is warm and dry.  ?Neurological:  ?   General: No focal deficit present.  ?   Mental Status: He is alert and oriented to person, place, and time.  ?Psychiatric:     ?   Behavior: Behavior is cooperative.  ?  ? ?Brief Hospital Course:  ?Rodney Barber is a 56 y.o.male with a history of COPD, CHF, CAD, HTN, DM, HLD, pancreatic insufficiency, anemia who was admitted to the family medicine teaching Service at [facility] for multifocal pneumonia. His hospital course is detailed below: ? ?Multifocal pneumonia with acute respiratory failure in the setting of COPD ?Patient met sepsis criteria on admission was hypoxic to 86%.  He was started on nonrebreather 10 L and tolerated this well, appropriately decreased oxygen supplementation over the course of his hospitalization.  He was started on antibiotics and given DuoNebs. Patient's respiratory status decompensated requiring significant supplemental oxygen and PCCM was consulted. Patient was transferred to the ICU as he needed increasing oxygen requirement.  While in the ICU, he was found to be parainfluenza positive on RVP.  He continued with CAP treatment, steroids, Breo, DuoNebs and was able to wean to high flow.  He was transferred back to the floor on 12/05/2021.  Room  air prior to discharge. Switched from breo to [place] due to cost and insurance coverage. Baseline O2 requirement at room air. ? ?AKI renal stones ?AKI in the setting of recent diarrhea and vomiting, and possibly due to volume loss.  Patient sees urology outpatient. Cr improved, baseline appears to be around 1.3 or less.  ? ?CHF  CAD  ?HTN  ?Continue home antihypertensives. Cr at baseline, 1.3-1.5 as our best guess. Coreg for CHF, HTN, CAD rather than metop.  ?- Continued coreg 12.2m BID  ?-  Continued home lasix 423mdaily ?- Valsartan 4066maily ?- isosorbide mononitrate 43m23mhr daily ? ?Pancreatic insufficiency  ?- Creon ? ?Other chronic conditions were medically managed with home medications and formulary alternatives as necessary (type 2 diabetes, hyperlipidemia, pancreatitis, anemia, rash, tobacco use disorder) ? ?PCP Follow-up Recommendations: ?CHF: Consider re-adding spironolactone if BP and volume status can tolerate for mortality benefits.  ?HTN: uptitrate coreg if needed. ?COPD: Dulera from breo due to cost ?DM2: Semglee 15u daily ? ? ?Significant Procedures:  ?No results found. ?  ?Significant Labs and Imaging:  ?Recent Labs  ?Lab 12/02/21 ?0800 12/03/21 ?0125 12/04/21 ?0145  ?WBC 11.7* 13.4* 12.5*  ?HGB 9.0* 10.0* 9.2*  ?HCT 28.5* 29.9* 28.3*  ?PLT 195 196 199  ? ?Recent Labs  ?Lab 12/02/21 ?0800 12/03/21 ?0125 12/04/21 ?0145 12/05/21 ?0216 12/06/21 ?0131  ?NA 137 141 137 139 140  ?K 4.3 3.7 3.7 3.7 3.9  ?CL 111 114* 108 110 109  ?CO2 19* 21* 23 21* 25  ?GLUCOSE 186* 85 232* 178* 160*  ?BUN 25* 27* 34* 31* 22*  ?CREATININE 1.84* 1.84* 1.83* 1.50* 1.33*  ?CALCIUM 8.1* 8.1* 7.6* 7.3* 7.8*  ?MG  --  2.0 2.3 2.3 2.2  ?ALKPHOS 78 66  --   --   --   ?AST 50* 130*  --   --   --   ?ALT 22 30  --   --   --   ?ALBUMIN 3.0* 2.7*  --   --   --   ? ?EKG Interpretation ? ?Date/Time:  Friday December 02 2021 08:04:55 EDT ?Ventricular Rate:  88 ?PR Interval:  157 ?QRS Duration: 79 ?QT Interval:  345 ?QTC Calculation: 418 ?R Axis:   42 ?Text Interpretation: Sinus rhythm Probable left atrial enlargement RSR' in V1 or V2, probably normal variant Nonspecific T abnormalities, lateral leads Confirmed by PickDavonna [id number] 12/02/2021 9:33:39 AM ?  ?Results for orders placed or performed during the hospital encounter of 12/02/21 (from the past 24 hour(s))  ?Glucose, capillary     Status: Abnormal  ? Collection Time: 12/05/21  5:00 PM  ?Result Value Ref Range  ? Glucose-Capillary 203 (H) 70 - 99 mg/dL   ?Glucose, capillary     Status: Abnormal  ? Collection Time: 12/05/21  9:08 PM  ?Result Value Ref Range  ? Glucose-Capillary 136 (H) 70 - 99 mg/dL  ? Comment 1 Notify RN   ? Comment 2 Document in Chart   ?Basic metabolic panel     Status: Abnormal  ? Collection Time: 12/06/21  1:31 AM  ?Result Value Ref Range  ? Sodium 140 135 - 145 mmol/L  ? Potassium 3.9 3.5 - 5.1 mmol/L  ? Chloride 109 98 - 111 mmol/L  ? CO2 25 22 - 32 mmol/L  ? Glucose, Bld 160 (H) 70 - 99 mg/dL  ? BUN 22 (H) 6 - 20 mg/dL  ? Creatinine, Ser 1.33 (H) 0.61 - 1.24 mg/dL  ? Calcium  7.8 (L) 8.9 - 10.3 mg/dL  ? GFR, Estimated >60 >60 mL/min  ? Anion gap 6 5 - 15  ?Magnesium     Status: None  ? Collection Time: 12/06/21  1:31 AM  ?Result Value Ref Range  ? Magnesium 2.2 1.7 - 2.4 mg/dL  ?Glucose, capillary     Status: None  ? Collection Time: 12/06/21  6:07 AM  ?Result Value Ref Range  ? Glucose-Capillary 89 70 - 99 mg/dL  ? Comment 1 Notify RN   ? Comment 2 Document in Chart   ?Glucose, capillary     Status: None  ? Collection Time: 12/06/21 12:14 PM  ?Result Value Ref Range  ? Glucose-Capillary 81 70 - 99 mg/dL  ?  ?DG CHEST PORT 1 VIEW ? ?Result Date: 12/04/2021 ?CLINICAL DATA:  Hypoxemia EXAM: PORTABLE CHEST 1 VIEW COMPARISON:  12/02/2021 FINDINGS: Stable cardiomegaly. Aortic atherosclerosis. Significant interval improvement in patchy bilateral airspace opacities. Opacity remains most confluent within the left upper lobe. Possible small left pleural effusion. No pneumothorax. IMPRESSION: Significant interval improvement in patchy bilateral airspace opacities, remaining most confluent within the left upper lobe. Electronically Signed   By: Davina Poke D.O.   On: 12/04/2021 09:44  ? ?DG CHEST PORT 1 VIEW ? ?Result Date: 12/02/2021 ?CLINICAL DATA:  Hypoxia EXAM: PORTABLE CHEST 1 VIEW COMPARISON:  Film from earlier in the same day. FINDINGS: Cardiac shadow is stable. Aortic calcifications are again seen. Previously seen patchy airspace opacities  have increased bilaterally but remain worse on the left than the right. No pneumothorax is noted. No sizable effusion is seen. No acute bony abnormality is noted. IMPRESSION: Persistent bilateral airspace opa

## 2021-12-06 NOTE — Plan of Care (Signed)
  Problem: Education: Goal: Knowledge of General Education information will improve Description: Including pain rating scale, medication(s)/side effects and non-pharmacologic comfort measures Outcome: Adequate for Discharge   Problem: Health Behavior/Discharge Planning: Goal: Ability to manage health-related needs will improve Outcome: Adequate for Discharge   Problem: Clinical Measurements: Goal: Ability to maintain clinical measurements within normal limits will improve Outcome: Adequate for Discharge Goal: Will remain free from infection Outcome: Adequate for Discharge Goal: Diagnostic test results will improve Outcome: Adequate for Discharge Goal: Respiratory complications will improve Outcome: Adequate for Discharge Goal: Cardiovascular complication will be avoided Outcome: Adequate for Discharge   Problem: Pain Managment: Goal: General experience of comfort will improve Outcome: Adequate for Discharge   Problem: Safety: Goal: Ability to remain free from injury will improve Outcome: Adequate for Discharge   

## 2021-12-06 NOTE — Evaluation (Signed)
Occupational Therapy Evaluation ?Patient Details ?Name: Rodney Barber ?MRN: [id number] ?DOB: 02-Jul-1966 ?Today's Date: 12/06/2021 ? ? ?History of Present Illness Patient is a 56 y/o male who presents on 4/28 with weakness, dizziness, vomiting/diarrhea; found to be hypoxic. Admitted with Acute respiratory failure with hypoxia 2/2 Parainfluenza PNA superimposed on Tobacco abuse & likely COPD.  PMH includes homesless, HTN, DM, alcoholic pancreatitis, HF, EF 36-46%, CAD.  ? ?Clinical Impression ?  ?Patient evaluated by Occupational Therapy with no further acute OT needs identified. All education has been completed regarding energy consevation and the patient has no further questions. See below for any follow-up Occupational Therapy or equipment needs. OT to sign off. Thank you for referral.  ?  ?   ? ?Recommendations for follow up therapy are one component of a multi-disciplinary discharge planning process, led by the attending physician.  Recommendations may be updated based on patient status, additional functional criteria and insurance authorization.  ? ?Follow Up Recommendations ? No OT follow up  ?  ?Assistance Recommended at Discharge None  ?Patient can return home with the following   ? ?  ?Functional Status Assessment ? Patient has had a recent decline in their functional status and demonstrates the ability to make significant improvements in function in a reasonable and predictable amount of time.  ?Equipment Recommendations ? None recommended by OT  ?  ?Recommendations for Other Services   ? ? ?  ?Precautions / Restrictions Precautions ?Precautions: None ?Restrictions ?Weight Bearing Restrictions: No  ? ?  ? ?Mobility Bed Mobility ?Overal bed mobility: Independent ?  ?  ?  ?  ?  ?  ?  ?  ? ?Transfers ?Overall transfer level: Independent ?  ?  ?  ?  ?  ?  ?  ?  ?  ?  ? ?  ?Balance   ?  ?  ?  ?  ?  ?  ?  ?  ?  ?  ?  ?  ?  ?  ?  ?  ?  ?  ?   ? ?ADL either performed or assessed with clinical judgement  ? ?ADL  Overall ADL's : Independent ?  ?  ?  ?  ?  ?  ?  ?  ?  ?  ?  ?  ?  ?  ?  ?  ?  ?  ?  ?General ADL Comments: FIO2 RA 90% of higher  ? ? ? ?Vision   ?   ?   ?Perception   ?  ?Praxis   ?  ? ?Pertinent Vitals/Pain Pain Assessment ?Pain Assessment: No/denies pain  ? ? ? ?Hand Dominance Right ?  ?Extremity/Trunk Assessment Upper Extremity Assessment ?Upper Extremity Assessment: Overall WFL for tasks assessed ?  ?Lower Extremity Assessment ?Lower Extremity Assessment: Overall WFL for tasks assessed ?  ?Cervical / Trunk Assessment ?Cervical / Trunk Assessment: Normal ?  ?Communication Communication ?Communication: No difficulties ?  ?Cognition Arousal/Alertness: Awake/alert ?Behavior During Therapy: [name] for tasks assessed/performed ?Overall Cognitive Status: Within Functional Limits for tasks assessed ?  ?  ?  ?  ?  ?  ?  ?  ?  ?  ?  ?  ?  ?  ?  ?  ?  ?  ?  ?General Comments  RA 90% no DOE or SOB. pt able to figure 4 to don socks ? ?  ?Exercises   ?  ?Shoulder Instructions    ? ? ?Home Living Family/patient expects to be discharged  to:: Private residence ?Living Arrangements:  ([facility]) ?Available Help at Discharge: Friend(s);Family (brother) ?Type of Home: House ?Home Access: Level entry ?  ?  ?Home Layout: One level ?  ?  ?Bathroom Shower/Tub: Walk-in shower ?  ?Bathroom Toilet: Standard ?  ?  ?Home Equipment: Rollator (4 wheels) ?  ?Additional Comments: pt reports that his last day at [name] was monday 12/05/21 and he has secured an apartment [address]. States his brother can drive him and help him get groceries ?  ? ?  ?Prior Functioning/Environment Prior Level of Function : Independent/Modified Independent ?  ?  ?  ?  ?  ?  ?Mobility Comments: Independent, no falls in last 6 months ?ADLs Comments: independent, does some IADLs ?  ? ?  ?  ?OT Problem List:   ?  ?   ?OT Treatment/Interventions:    ?  ?OT Goals(Current goals can be found in the care plan section) Acute Rehab OT Goals ?Patient  Stated Goal: to get out today  ?OT Frequency:   ?  ? ?Co-evaluation   ?  ?  ?  ?  ? ?  ?AM-PAC OT "6 Clicks" Daily Activity     ?Outcome Measure Help from another person eating meals?: None ?Help from another person taking care of personal grooming?: None ?Help from another person toileting, which includes using toliet, bedpan, or urinal?: None ?Help from another person bathing (including washing, rinsing, drying)?: None ?Help from another person to put on and taking off regular upper body clothing?: None ?Help from another person to put on and taking off regular lower body clothing?: None ?6 Click Score: 24 ?  ?End of Session Nurse Communication: Mobility status;Precautions ? ?Activity Tolerance: Patient tolerated treatment well ?Patient left: in bed;with call bell/phone within reach ? ?OT Visit Diagnosis: Unsteadiness on feet (R26.81)  ?              ?Time: 4540-9811 ?OT Time Calculation (min): 16 min ?Charges:  OT General Charges ?$OT Visit: 1 Visit ?OT Evaluation ?$OT Eval Low Complexity: 1 Low ? ? ?Brynn, OTR/L  ?Acute Rehabilitation Services ?Office: [phone] ?. ? ? ?[name] ?12/06/2021, 11:35 AM ?

## 2021-12-06 NOTE — Discharge Instructions (Addendum)
Dear Rodney Barber, ? ?Thank you for letting us participate in your care. You were hospitalized for difficulty breathing and were diagnosed with a COPD exacerbation from pneumonia from the parainfluenza virus.  ? ?POST-HOSPITAL & CARE INSTRUCTIONS ?Call and make a hospital follow up appointment with your primary care doctor.  ?Continue taking your carvedilol 12.5 mg every day to treat blood pressure. Your PCP will measure your blood pressure and adjust your medicines as necessary.  ?Take valsartan 40 mg every day (lower than your normal home dose). When you go back to your cardiology doctor, they will adjust the dose.  ?Do not yet restart your other home blood pressure medications. We will let your PCP make the decision of when to restart you on this, if you need it in the future.  ?Continue taking Lasix 40 mg every day. At your follow up appointment, your PCP will assess your creatinine and potassium and possibly make adjustments.  ?Take your semglee daily insulin 15 units every day. Measure your blood sugar at least daily and also with any suspected hypoglycemic (low blood sugar) episodes. ?Do your best to follow a heart healthy diet at home.  ?Consider using nicotine patches and other resources for smoking cessation.  ? http://carroll-castaneda.info/ ? Free 24/7 support at 1-800-QUIT-NOW ?Go to your follow up appointments (listed below) ? ? ?DOCTOR'S APPOINTMENT   ?Future Appointments  ?Date Time Provider Department Center  ?01/23/2022  2:30 PM [name], MD [id number] None  ? ? Follow-up Information   ? ? [name], MD. Schedule an appointment as soon as possible for a visit in 1 week(s).   ?Specialty: Pulmonary Disease ?Why: Make a hospital follow up appointment with your primary care clinic within one week of hospital discharge. ?Contact information: ?[address] ?[address] ?[place] [address] ?[phone] ? ? ?  ?  ? ? [place], Peter M, MD. Schedule an appointment as soon as  possible for a visit.   ?Specialty: Cardiology ?Why: Make a hospital follow up appointment with your cardiologist. Should be within 2 weeks of hospital discharge. Your cardiologist will need to adjust your medications. ?Contact information: ?[address] ?[address] ?[place] [address] ?[phone] ? ? ?  ?  ? ?  ?  ? ?  ? ? ?Take care and be well! ? ?Family Medicine Teaching Service Inpatient Team ?[facility]  ?[facility]  ?[address] [place], [place] [address] ?[phone] ?

## 2021-12-06 NOTE — TOC Transition Note (Signed)
Transition of Care (TOC) - CM/SW Discharge Note ?Donn Pierini Charity fundraiser, BSN ?Transitions of Care ?Unit 4E- RN Case Manager ?See Treatment Team for direct phone #  ? ? ?Patient Details  ?Name: Rodney Barber ?MRN: [id number] ?Date of Birth: 04-15-1966 ? ?Transition of Care (TOC) CM/SW Contact:  ?Zenda Alpers, Lenn Sink, RN ?Phone Number: ?12/06/2021, 12:16 PM ? ? ?Clinical Narrative:    ?Pt stable for transition back to [facility] (shelter). Per PT recommendation for cane- and pt asking for DME-cane. Order requested from MD and order has been placed.  ?Call made to in house provider- [employer] for DME- cane-to be delivered to room prior to discharge.  ? ?Pt requesting bus pass- CSW to provide.  ? ?Pt is followed by [facility]- Dr. [name].  ? ? ?Final next level of care: Homeless Shelter ?Barriers to Discharge: No Barriers Identified ? ? ?Patient Goals and CMS Choice ?  ?  ?Choice offered to / list presented to : NA ? ?Discharge Placement ?  ?           ? Homeless Shelter ?  ?  ?  ? ?Discharge Plan and Services ?In-house Referral: Clinical Social Work ?Discharge Planning Services: CM Consult ?Post Acute Care Choice: NA          ?DME Arranged: Gilmer Mor ?DME Agency: [employer] ?Date DME Agency Contacted: 12/06/21 ?Time DME Agency Contacted: 1215 ?Representative spoke with at DME Agency: Leavy Cella ?HH Arranged: NA ?HH Agency: NA ?  ?  ?  ? ?Social Determinants of Health (SDOH) Interventions ?  ? ? ?Readmission Risk Interventions ? ?  12/06/2021  ? 11:48 AM 12/31/2019  ? 10:13 AM 10/27/2019  ? 11:00 AM  ?Readmission Risk Prevention Plan  ?Transportation Screening Complete Complete Complete  ?Home Care Screening Complete    ?Medication Review (RN CM) Complete    ?Medication Review Oceanographer)  Complete   ?PCP or Specialist appointment within 3-5 days of discharge  Complete   ?HRI or Home Care Consult  Complete   ?SW Recovery Care/Counseling Consult  Complete   ?Skilled Nursing Facility  Not Applicable   ? ? ? ? ? ?

## 2021-12-06 NOTE — Progress Notes (Signed)
FPTS Brief Progress Note ? ?S:Went to bedside to check on patient, patient sleeping comfortably. Did not disturb.  ? ? ?O: ?BP (!) 164/69 (BP Location: Right Arm)   Pulse 72   Temp 98.8 ?F (37.1 ?C) (Oral)   Resp 18   Ht 5\' 8"  (1.727 m)   Wt 57.1 kg   SpO2 94%   BMI 19.14 kg/m?   ? ? ?A/P: ?- Plans per day team ?- Final day of abx ?- Orders reviewed. Labs for AM ordered, which was adjusted as needed.  ? ? ? , MD ?12/06/2021, 1:51 AM ?PGY-1, [facility] Night Resident  ?Please page [phone] with questions.  ? ? ?

## 2021-12-06 NOTE — Progress Notes (Signed)
Mobility Specialist Progress Note ? ? 12/06/21 1237  ?Mobility  ?Activity Refused mobility  ? ?Pt dressed and ready for d/c. Declining mobility session to save energy. ? ?Holland Falling ?Mobility Specialist ?Phone Number [phone] ? ?

## 2021-12-06 NOTE — Progress Notes (Signed)
Family Medicine Teaching Service ?Daily Progress Note ?Intern Pager: [phone] ? ?Patient name: Rodney Barber Medical record number: [id number] ?Date of birth: 1965-08-11 Age: 56 y.o. Gender: male ? ?Primary Care Provider: [name], MD ?Consultants: PCCM ?Code Status: Full Code  ? ?Pt Overview and Major Events to Date:  ?4/28 admit to FPTS after presenting with dizziness, vomiting diarrhea.  Profoundly hypoxic and with multifocal PNA. PCCM consult due to escalating O2 needs  ?4/29 Transferred to ICU overnight for worsening respiratory failure. Diuresed ?5/1 Returned to floor and FMTS ? ?Assessment and Plan: ?Rodney Barber is a 56 y.o. male presenting with 1 day of fatigue, generalized weakness, hypoxic was found to multilobar pneumonia on CXR. 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 ?HD 4  ? ?Acute respiratory failure with hypoxia 2/2 Parainfluenza PNA +/- CAP +/- Pulmonary edema  ?Superimposed on Tobacco abuse & likely COPD - resolved ?On room air currently x2d from 6L [employer]. WBC downtrending.  ?- Azithromycin and Rocephin, last dose on 5/2 ?- Continue to monitor vital signs ?- Breo daily ?- SPO2 goal >88% ? ?HFimpEF (40-45%) ?Euvolemic on exam. Cr at baseline, 1.3-1.5 as our best guess. K wnl.  ?- Continued coreg 12.5mg  BID  ?- Continued home lasix 40mg  daily ?- Restarted home valsartan at lowest does  ?- I/Os, daily wts ? ?DM Type 2 ?6u short acting insulin, fasting glc 173. CBG was 200s yesterday ?- Continued 15u semglee  ? ?CAD, HLD ?- Continued asa, atorvastatin 40 ? ?FEN/GI: Clear liquid  ?PPx: enoxaparin (LOVENOX) injection 40 mg Start: 12/02/21 1100  ?Dispo: No PT/OT f/u. DME cane. Barriers include completing abx. ? ?Subjective:  ?No family at bedside.  ? ?Rodney Barber was seen this AM. Reported feeling better. No SOB, CP. No acute concerns, he inquired about dispo.  ? ?Objective: ?Temp:  [97.8 ?F (36.6 ?C)-98.8 ?F (37.1 ?C)] 97.9 ?F (36.6 ?C) (05/02 0800) ?Pulse  Rate:  [65-74] 69 (05/02 0800) ?Resp:  [17-18] 18 (05/02 0800) ?BP: (157-167)/(53-77) 161/76 (05/02 0800) ?SpO2:  [91 %-100 %] 92 % (05/02 0800) ?Weight:  [57.9 kg] 57.9 kg (05/02 0449) ?Physical Exam ?Vitals and nursing note reviewed.  ?Constitutional:   ?   General: He is not in acute distress. ?   Appearance: He is not toxic-appearing.  ?   Comments: Sleep, easily awakened for exam  ?HENT:  ?   Head: Normocephalic and atraumatic.  ?Cardiovascular:  ?   Rate and Rhythm: Normal rate.  ?   Heart sounds: No murmur heard. ?  No gallop.  ?Pulmonary:  ?   Effort: Pulmonary effort is normal.  ?   Breath sounds: Wheezing (Expiratory, diffused) present. Rales: RLL. ?Abdominal:  ?   General: Abdomen is flat. There is no distension.  ?   Palpations: Abdomen is soft.  ?   Tenderness: There is no abdominal tenderness. There is no guarding.  ?Musculoskeletal:  ?   Right lower leg: No edema.  ?   Left lower leg: No edema.  ?Skin: ?   General: Skin is warm and dry.  ?Neurological:  ?   Mental Status: He is oriented to person, place, and time.  ?  ? ?Laboratory: ?Recent Labs  ?Lab 12/02/21 ?0800 12/03/21 ?0125 12/04/21 ?0145  ?WBC 11.7* 13.4* 12.5*  ?HGB 9.0* 10.0* 9.2*  ?HCT 28.5* 29.9* 28.3*  ?PLT 195 196 199  ? ? ?Recent Labs  ?Lab 12/02/21 ?0800 12/03/21 ?0125 12/04/21 ?0145 12/05/21 ?0216 12/06/21 ?0131  ?NA 137 141  137 139 140  ?K 4.3 3.7 3.7 3.7 3.9  ?CL 111 114* 108 110 109  ?CO2 19* 21* 23 21* 25  ?BUN 25* 27* 34* 31* 22*  ?CREATININE 1.84* 1.84* 1.83* 1.50* 1.33*  ?CALCIUM 8.1* 8.1* 7.6* 7.3* 7.8*  ?PROT 6.2* 6.0*  --   --   --   ?BILITOT 0.3 0.5  --   --   --   ?ALKPHOS 78 66  --   --   --   ?ALT 22 30  --   --   --   ?AST 50* 130*  --   --   --   ?GLUCOSE 186* 85 232* 178* 160*  ? ? ?Results for orders placed or performed during the hospital encounter of 12/02/21 (from the past 24 hour(s))  ?Glucose, capillary     Status: Abnormal  ? Collection Time: 12/05/21 11:46 AM  ?Result Value Ref Range  ? Glucose-Capillary 224  (H) 70 - 99 mg/dL  ?Glucose, capillary     Status: Abnormal  ? Collection Time: 12/05/21  5:00 PM  ?Result Value Ref Range  ? Glucose-Capillary 203 (H) 70 - 99 mg/dL  ?Glucose, capillary     Status: Abnormal  ? Collection Time: 12/05/21  9:08 PM  ?Result Value Ref Range  ? Glucose-Capillary 136 (H) 70 - 99 mg/dL  ? Comment 1 Notify RN   ? Comment 2 Document in Chart   ?Basic metabolic panel     Status: Abnormal  ? Collection Time: 12/06/21  1:31 AM  ?Result Value Ref Range  ? Sodium 140 135 - 145 mmol/L  ? Potassium 3.9 3.5 - 5.1 mmol/L  ? Chloride 109 98 - 111 mmol/L  ? CO2 25 22 - 32 mmol/L  ? Glucose, Bld 160 (H) 70 - 99 mg/dL  ? BUN 22 (H) 6 - 20 mg/dL  ? Creatinine, Ser 1.33 (H) 0.61 - 1.24 mg/dL  ? Calcium 7.8 (L) 8.9 - 10.3 mg/dL  ? GFR, Estimated >60 >60 mL/min  ? Anion gap 6 5 - 15  ?Magnesium     Status: None  ? Collection Time: 12/06/21  1:31 AM  ?Result Value Ref Range  ? Magnesium 2.2 1.7 - 2.4 mg/dL  ?Glucose, capillary     Status: None  ? Collection Time: 12/06/21  6:07 AM  ?Result Value Ref Range  ? Glucose-Capillary 89 70 - 99 mg/dL  ? Comment 1 Notify RN   ? Comment 2 Document in Chart   ? ? ?Imaging/Diagnostic Tests: ?No results found.  ? ?Princess Bruins, DO ?12/06/2021, 11:02 AM ?PGY-1, [facility] ?FPTS Intern pager: [phone], text pages welcome  ?

## 2021-12-07 ENCOUNTER — Telehealth: Payer: Self-pay

## 2021-12-07 ENCOUNTER — Other Ambulatory Visit: Payer: Self-pay

## 2021-12-07 ENCOUNTER — Encounter: Payer: Self-pay | Admitting: Physician Assistant

## 2021-12-07 ENCOUNTER — Other Ambulatory Visit: Payer: Self-pay | Admitting: Critical Care Medicine

## 2021-12-07 LAB — CULTURE, BLOOD (ROUTINE X 2)
Culture: NO GROWTH
Culture: NO GROWTH
Special Requests: ADEQUATE

## 2021-12-07 MED ORDER — FUROSEMIDE 40 MG PO TABS: ORAL_TABLET | ORAL | 0 refills | Status: DC

## 2021-12-07 MED ORDER — SPIRONOLACTONE 25 MG PO TABS: 25.0000 mg | ORAL_TABLET | Freq: Every day | ORAL | 3 refills | Status: DC

## 2021-12-07 MED ORDER — INSULIN LISPRO (1 UNIT DIAL) 100 UNIT/ML (KWIKPEN): 5.0000 [IU] | PEN_INJECTOR | Freq: Two times a day (BID) | SUBCUTANEOUS | 1 refills | Status: DC

## 2021-12-07 NOTE — Telephone Encounter (Signed)
Transition Care Management Unsuccessful Follow-up Telephone Call ? ?Date of discharge and from where:  [facility] on 12/06/2021 ? ?Attempts:  1st Attempt ? ?Reason for unsuccessful TCM follow-up call:  Left voice message unable to reach . Need to schedule HFU appt with PCP. Call back requested. ? ?  ?

## 2021-12-07 NOTE — Progress Notes (Addendum)
Pt seen by Dr Joya Gaskins. ? ?He has keys, but no furniture for his apartment yet.  ? ?Nicki Reaper says they have a contract w/ [employer] for furniture, they said they have not received the money.  ? ?Bonnita Nasuti will call tomorrow about this.  ? ?He was hospitalized for PNA, sepsis, at one point req 45 lpm O2. ?He also had parainfluenza.  ? ?Valsartan was decreased from 320 mg >> 40 mg  ?Aldactone was stopped on the d/c summary, but will be restarted since not on Lasix. ?Coreg 25 mg bid >> 12.5 mg bid, ok to use 1/2 of the 25 mg tabs ?Lasix 40 mg qd was started, but since has not picked up his pills, has not taken it >> do not use for now, it is on hold.  ?Cetirizine, stop ?Gabapentin was stopped, it will be restarted, because it helped his pain.  ?Hydrozyzine was stopped ?Lantus decreased from 25 u to 15 u qd ?Humalog was 5 u bid w/ meals, was stopped, will be restarted at the previous dose. ?Protonix, Creon, Imdur, atorvastatin, ASA 81 mg no change ? ?He was sent home on Breo, nicotine patch, Lasix. However, the rx was not sent to [place], it was sent to [place], where it still sits. Breo was changed to [facility] due to ins, it is there.  ?PW gave instructions on using the inhaler.  ? ?He is stuffy, but feels better than at d/c. He has a cane, does not use it all the time.  ? ?99/56, 65, 98% RA wt 132.8 lbs ?Faint basilar rales, no LE edema, no JVD on exam.  ?Wt Readings from Last 3 Encounters:  ?12/06/21 127 lb 10.3 oz (57.9 kg)  ?11/16/21 139 lb (63 kg)  ?09/27/21 145 lb (65.8 kg)  ?  ? ?He was not feeling well this am, did not eat. He did not take his insulin.  ?At lunch, CBG was 401. He took 15 u insulin then. CBG 244 now.  ? ?Helen aware of med changes, med box updated today, she will fill tomorrow.  ? ?Rosaria Ferries, PA-C ?12/07/2021 ?4:48 PM ? ? ? ? ? ? ? ? ?

## 2021-12-08 ENCOUNTER — Other Ambulatory Visit (HOSPITAL_COMMUNITY): Payer: Self-pay

## 2021-12-08 ENCOUNTER — Telehealth: Payer: Self-pay

## 2021-12-08 ENCOUNTER — Other Ambulatory Visit: Payer: Self-pay

## 2021-12-08 ENCOUNTER — Other Ambulatory Visit: Payer: Self-pay | Admitting: *Deleted

## 2021-12-08 ENCOUNTER — Other Ambulatory Visit: Payer: Self-pay | Admitting: Critical Care Medicine

## 2021-12-08 MED ORDER — FERROUS SULFATE 325 (65 FE) MG PO TABS
325.0000 mg | ORAL_TABLET | Freq: Two times a day (BID) | ORAL | 3 refills | Status: DC
  Filled 2021-12-08: qty 180, 90d supply, fill #0

## 2021-12-08 MED ORDER — GABAPENTIN 100 MG PO CAPS
100.0000 mg | ORAL_CAPSULE | Freq: Three times a day (TID) | ORAL | 0 refills | Status: DC
  Filled 2021-12-08: qty 90, 30d supply, fill #0

## 2021-12-08 NOTE — Telephone Encounter (Signed)
Transition Care Management Unsuccessful Follow-up Telephone Call ?  ?Date of discharge and from where:  [facility] on 12/06/2021 ?  ?Attempts:  2nd Attempt ?  ?Reason for unsuccessful TCM follow-up call:  Left voice message unable to reach . Need to schedule HFU appt with PCP. Call back requested. ?

## 2021-12-09 ENCOUNTER — Telehealth: Payer: Self-pay

## 2021-12-09 ENCOUNTER — Other Ambulatory Visit: Payer: Self-pay

## 2021-12-09 NOTE — Telephone Encounter (Signed)
Transition Care Management Unsuccessful Follow-up Telephone Call ?  ?Date of discharge and from where:  [facility] on 12/06/2021 ?  ?Attempts:  3rd Attempt ?  ?Reason for unsuccessful TCM follow-up call:  Left voice message unable to reach . Need to schedule HFU appt with PCP. Call back requested. ?

## 2021-12-12 NOTE — Telephone Encounter (Signed)
Patient has an appointment with Dr [name] - 01/23/2022.  ?

## 2021-12-15 ENCOUNTER — Other Ambulatory Visit: Payer: Self-pay

## 2021-12-15 ENCOUNTER — Emergency Department (HOSPITAL_COMMUNITY)
Admission: EM | Admit: 2021-12-15 | Discharge: 2021-12-15 | Disposition: A | Discharge status: Home / Self Care | Payer: Medicaid Other | Attending: Emergency Medicine | Admitting: Emergency Medicine

## 2021-12-15 ENCOUNTER — Encounter: Payer: Self-pay | Admitting: *Deleted

## 2021-12-15 DIAGNOSIS — I10 Essential (primary) hypertension: Secondary | ICD-10-CM | POA: Diagnosis not present

## 2021-12-15 DIAGNOSIS — E10649 Type 1 diabetes mellitus with hypoglycemia without coma: Secondary | ICD-10-CM | POA: Diagnosis not present

## 2021-12-15 DIAGNOSIS — Z794 Long term (current) use of insulin: Secondary | ICD-10-CM | POA: Diagnosis not present

## 2021-12-15 DIAGNOSIS — Z7982 Long term (current) use of aspirin: Secondary | ICD-10-CM | POA: Insufficient documentation

## 2021-12-15 DIAGNOSIS — E162 Hypoglycemia, unspecified: Secondary | ICD-10-CM

## 2021-12-15 DIAGNOSIS — Z79899 Other long term (current) drug therapy: Secondary | ICD-10-CM | POA: Insufficient documentation

## 2021-12-15 LAB — CBG MONITORING, ED: Glucose-Capillary: 127 mg/dL — ABNORMAL HIGH (ref 70–99)

## 2021-12-15 NOTE — ED Triage Notes (Signed)
Pt BIB [facility] from [facility] where he was having a doctors visit. Np at visit reported to EMS that the patient had a CBG of 37. EMS administered 30g oral glucose, 2 poptarts, 2 OJ cartons, and a cookie. Pt CBG improved to 104, and dropped to 92, then 72 at arrival to ED. Patient A&Ox4 at the moment. VS stable. ?

## 2021-12-15 NOTE — Congregational Nurse Program (Signed)
Pt came in to have med box done. Had conversation about pillbox, furniture, family, pt while talking went to sleep after 1 hour awoke pt he was confused , ems called cbg 37, gave oj, cookies, candy came up to 40. Taken via ems to [place].

## 2021-12-15 NOTE — ED Notes (Signed)
Patient observed in hallway and endorses plan to leave. This RN encouraged patient to eat and stay for another CBG check. Patient stated to RN that he would stay and return to bed. Patient did not return to bed and is not observed in department at this time. ?

## 2021-12-15 NOTE — ED Provider Notes (Signed)
?[facility] ?Provider Note ? ? ?CSN: [id number] ?Arrival date & time: 12/15/21  1758 ? ?  ? ?History ? ?Chief Complaint  ?Patient presents with  ? Hypoglycemia  ? ? ?Rodney Barber is a 56 y.o. male. ? ?Patient is a 56 year old male with a history of diabetes on Lantus and sliding scale insulin, pancreatitis, alcohol use, anemia and hypertension who is presenting today from [facility] due to hypoglycemia.  Patient reports that he was feeling fine this morning he took his usual 25 units of Lantus and had breakfast.  He reports that close to noon he checked and his blood sugar was 170 and he took 5 units of sliding scale insulin.  However he did not have anything to eat after that and while he was at [employer] talking to [place] he suddenly started feeling unwell.  They checked his blood sugar and it was 37.  They gave patient glucagon, pop tarts and orange juice with improvement of blood sugar to 90.  They reported it did drop down to 72 again but upon arrival to the emergency room patient's blood sugar is greater than 100.  Patient states last week he had been treated for pneumonia but he had been feeling well in the last few days.  He reports that in the past he has had issues with hypoglycemia but 5 months ago he was hospitalized for it and they change the doses of his insulin and he has not had a problem since.  He reports now he feels fine. ? ?The history is provided by the patient and medical records.  ?Hypoglycemia ? ?  ? ?Home Medications ?Prior to Admission medications   ?Medication Sig Start Date End Date Taking? Authorizing Provider  ?Accu-Chek Softclix Lancets lancets Ues to check blood sugar 4 (four) times daily. 09/14/21   Elsie Stain, MD  ?acetaminophen (TYLENOL) 325 MG tablet Take 2 tablets (650 mg total) by mouth every 6 (six) hours as needed for mild pain. 12/06/21   Lattie Haw, MD  ?aspirin 81 MG chewable tablet HOLD due to  Hematuria ?Patient taking differently: Chew 81 mg by mouth daily. HOLD due to Hematuria 05/24/21   Elsie Stain, MD  ?atorvastatin (LIPITOR) 80 MG tablet Take 1 tablet (80 mg total) by mouth daily at 6pm 12/01/21   Elsie Stain, MD  ?Blood Glucose Monitoring Suppl (ACCU-CHEK GUIDE) w/Device KIT Use to check blood sugar four times daily. E11.65 09/14/21   Elsie Stain, MD  ?carvedilol (COREG) 12.5 MG tablet Take 1 tablet (12.5 mg total) by mouth 2 (two) times daily with a meal. 12/06/21 01/07/22  Lattie Haw, MD  ?feeding supplement, GLUCERNA SHAKE, (GLUCERNA SHAKE) LIQD Take 237 mLs by mouth 3 (three) times daily between meals. 09/11/19   Mariel Aloe, MD  ?ferrous sulfate (FEROSUL) 325 (65 FE) MG tablet Take 1 tablet (325 mg total) by mouth 2 (two) times daily with a meal. 12/08/21   Elsie Stain, MD  ?furosemide (LASIX) 40 MG tablet HOLD 12/07/21   Elsie Stain, MD  ?gabapentin (NEURONTIN) 100 MG capsule Take 1 capsule (100 mg total) by mouth 3 (three) times daily. 12/08/21   Elsie Stain, MD  ?Glucose 4-6 GM-MG CHEW Chew one as needed for blood glucose < 70 ?Patient taking differently: Chew 1 tablet by mouth 2 (two) times daily as needed (low glucose). Chew one as needed for blood glucose < 70 05/06/20   Elsie Stain, MD  ?  glucose blood (ACCU-CHEK GUIDE) test strip Use to check blood sugar four times daily. E11.65 09/14/21   Elsie Stain, MD  ?insulin glargine (LANTUS) 100 UNIT/ML Solostar Pen Inject 15 Units into the skin daily. 12/06/21   Lattie Haw, MD  ?insulin lispro (HUMALOG) 100 UNIT/ML KwikPen Inject 5 Units into the skin 2 (two) times daily before a meal. Skip your dose if you do not eat or if your sugar is <150. 12/07/21 04/06/22  Elsie Stain, MD  ?Insulin Pen Needle 31G X 5 MM MISC USE WITH INSULIN PENS 09/01/21   Elsie Stain, MD  ?isosorbide mononitrate (IMDUR) 30 MG 24 hr tablet Take 1 tablet (30 mg total) by mouth daily. 10/06/21   Elsie Stain, MD   ?mometasone-formoterol [facility]) 100-5 MCG/ACT AERO Inhale 2 puffs into the lungs 2 (two) times daily. Shake well ?Rinse mouth with water and spit following inhalation. 12/06/21   Zenia Resides, MD  ?nicotine (NICODERM CQ - DOSED IN MG/24 HOURS) 14 mg/24hr patch Place 1 patch (14 mg total) onto the skin daily. 12/07/21   Lattie Haw, MD  ?nitroGLYCERIN (NITROSTAT) 0.4 MG SL tablet Place 1 tablet (0.4 mg total) under the tongue every 5 (five) minutes as needed for chest pain. 12/15/20   Elsie Stain, MD  ?Pancrelipase, Lip-Prot-Amyl, 6000-19000 units CPEP Take 2 capsules (12,000 Units total) by mouth in the morning, at noon, and at bedtime. 11/24/21   Elsie Stain, MD  ?pantoprazole (PROTONIX) 40 MG tablet Take 1 tablet (40 mg total) by mouth daily. 10/06/21   Elsie Stain, MD  ?spironolactone (ALDACTONE) 25 MG tablet Take 1 tablet (25 mg total) by mouth daily. 12/07/21   Elsie Stain, MD  ?valsartan (DIOVAN) 40 MG tablet Take 1 tablet (40 mg total) by mouth daily. 12/06/21 01/07/22  Lattie Haw, MD  ?Ferrous Sulfate Dried 200 (65 Fe) MG TABS TAKE 200 MG BY MOUTH 2 (TWO) TIMES DAILY WITH A MEAL. 02/02/21 04/20/21  Elsie Stain, MD  ?   ? ?Allergies    ?Patient has no known allergies.   ? ?Review of Systems   ?Review of Systems ? ?Physical Exam ?Updated Vital Signs ?BP (!) 157/69   Pulse 65   Temp (!) 97.5 ?F (36.4 ?C)   Resp 16   SpO2 100%  ?Physical Exam ?Vitals and nursing note reviewed.  ?Constitutional:   ?   General: He is not in acute distress. ?   Appearance: He is well-developed.  ?HENT:  ?   Head: Normocephalic and atraumatic.  ?Eyes:  ?   Conjunctiva/sclera: Conjunctivae normal.  ?   Pupils: Pupils are equal, round, and reactive to light.  ?Cardiovascular:  ?   Rate and Rhythm: Normal rate and regular rhythm.  ?   Heart sounds: No murmur heard. ?Pulmonary:  ?   Effort: Pulmonary effort is normal. No respiratory distress.  ?   Breath sounds: Normal breath sounds. No wheezing or rales.   ?Abdominal:  ?   General: There is no distension.  ?   Palpations: Abdomen is soft.  ?   Tenderness: There is no abdominal tenderness. There is no guarding or rebound.  ?Musculoskeletal:     ?   General: No tenderness. Normal range of motion.  ?   Cervical back: Normal range of motion and neck supple.  ?Skin: ?   General: Skin is warm and dry.  ?   Findings: No erythema or rash.  ?Neurological:  ?  Mental Status: He is alert and oriented to person, place, and time.  ?Psychiatric:     ?   Behavior: Behavior normal.  ? ? ?ED Results / Procedures / Treatments   ?Labs ?(all labs ordered are listed, but only abnormal results are displayed) ?Labs Reviewed  ?CBG MONITORING, ED - Abnormal; Notable for the following components:  ?    Result Value  ? Glucose-Capillary 127 (*)   ? All other components within normal limits  ?BASIC METABOLIC PANEL  ?CBC WITH DIFFERENTIAL/PLATELET  ? ? ?EKG ?None ? ?Radiology ?No results found. ? ?Procedures ?Procedures  ? ? ?Medications Ordered in ED ?Medications - No data to display ? ?ED Course/ Medical Decision Making/ A&P ?  ?                        ?Medical Decision Making ?Amount and/or Complexity of Data Reviewed ?Labs: ordered. ? ? ?Pt with multiple medical problems and comorbidities and presenting today with a complaint that caries a high risk for morbidity and mortality.  Presenting today with hypoglycemia.  This is most likely related to using insulin and not eating however patient was recently diagnosed and treated for pneumonia but is no longer on any antibiotics.  He is still taking all of his home medications.  Patient is well-appearing at this time.  Prior external medical records from recent visits were reviewed.  Patient's initial blood sugar here was 127.  We will give patient something to eat, check labs to ensure no change in renal function.  We will repeat blood sugar to ensure there is no further drop. ? ?Patient eloped from the emergency room.  Went back to the bed and  patient was gone.  He did not get blood work drawn.  Patient was unable to be found. ? ? ? ? ? ? ? ?Final Clinical Impression(s) / ED Diagnoses ?Final diagnoses:  ?Hypoglycemia  ? ? ?Rx / DC Orders ?ED Discharge Orders   ? ? None

## 2021-12-21 ENCOUNTER — Other Ambulatory Visit: Payer: Self-pay

## 2021-12-21 ENCOUNTER — Other Ambulatory Visit: Payer: Self-pay | Admitting: Critical Care Medicine

## 2021-12-21 MED ORDER — INSULIN LISPRO (1 UNIT DIAL) 100 UNIT/ML (KWIKPEN)
5.0000 [IU] | PEN_INJECTOR | Freq: Two times a day (BID) | SUBCUTANEOUS | 1 refills | Status: DC
  Filled 2021-12-21: qty 3, 28d supply, fill #0
  Filled 2022-02-09: qty 3, 28d supply, fill #1

## 2021-12-23 ENCOUNTER — Other Ambulatory Visit: Payer: Self-pay

## 2021-12-23 NOTE — Telephone Encounter (Signed)
Patient recently discharged from [facility], called to set up surgery. No answer. Left detailed message for patient to return my call.

## 2022-01-05 ENCOUNTER — Other Ambulatory Visit: Payer: Self-pay | Admitting: *Deleted

## 2022-01-05 ENCOUNTER — Other Ambulatory Visit: Payer: Self-pay

## 2022-01-05 MED ORDER — VALSARTAN 40 MG PO TABS
40.0000 mg | ORAL_TABLET | Freq: Every day | ORAL | 0 refills | Status: DC
Start: 2022-01-05 — End: 2022-02-02
  Filled 2022-01-05: qty 30, 30d supply, fill #0

## 2022-01-06 ENCOUNTER — Other Ambulatory Visit: Payer: Self-pay

## 2022-01-12 ENCOUNTER — Other Ambulatory Visit: Payer: Self-pay | Admitting: *Deleted

## 2022-01-12 ENCOUNTER — Other Ambulatory Visit (HOSPITAL_COMMUNITY): Payer: Self-pay

## 2022-01-12 ENCOUNTER — Other Ambulatory Visit: Payer: Self-pay

## 2022-01-12 ENCOUNTER — Encounter: Payer: Self-pay | Admitting: *Deleted

## 2022-01-12 DIAGNOSIS — E1165 Type 2 diabetes mellitus with hyperglycemia: Secondary | ICD-10-CM

## 2022-01-12 MED ORDER — INSULIN PEN NEEDLE 31G X 5 MM MISC
6 refills | Status: DC
  Filled 2022-01-12: qty 100, 33d supply, fill #0

## 2022-01-12 MED ORDER — ACCU-CHEK GUIDE VI STRP
ORAL_STRIP | 12 refills | Status: DC
  Filled 2022-01-12: qty 100, 25d supply, fill #0

## 2022-01-12 NOTE — Congregational Nurse Program (Signed)
Prepared pill box today reordered meds. Reminded to please take meds as scheduled. States he has food. States he is doing well. Still does not have furniture. Will see again next week

## 2022-01-16 ENCOUNTER — Other Ambulatory Visit: Payer: Self-pay

## 2022-01-19 ENCOUNTER — Other Ambulatory Visit: Payer: Self-pay | Admitting: *Deleted

## 2022-01-19 ENCOUNTER — Other Ambulatory Visit: Payer: Self-pay

## 2022-01-19 MED ORDER — ATORVASTATIN CALCIUM 80 MG PO TABS
80.0000 mg | ORAL_TABLET | Freq: Every day | ORAL | 6 refills | Status: DC
  Filled 2022-01-19: qty 30, 30d supply, fill #0

## 2022-01-23 ENCOUNTER — Ambulatory Visit: Payer: Medicaid Other | Admitting: Critical Care Medicine

## 2022-01-24 ENCOUNTER — Telehealth: Payer: Self-pay

## 2022-01-24 NOTE — Telephone Encounter (Signed)
I have tried calling to schedule surgery. No Answer and I have left 5 voicemails. Removing from Surgery Scheduling box. Patient will call us to schedule surgery.

## 2022-01-25 ENCOUNTER — Other Ambulatory Visit: Payer: Self-pay

## 2022-01-26 ENCOUNTER — Other Ambulatory Visit: Payer: Self-pay | Admitting: *Deleted

## 2022-01-26 ENCOUNTER — Other Ambulatory Visit: Payer: Self-pay

## 2022-01-26 MED ORDER — CARVEDILOL 12.5 MG PO TABS
12.5000 mg | ORAL_TABLET | Freq: Two times a day (BID) | ORAL | 0 refills | Status: DC
  Filled 2022-01-26: qty 60, 30d supply, fill #0

## 2022-01-26 MED ORDER — ATORVASTATIN CALCIUM 80 MG PO TABS
80.0000 mg | ORAL_TABLET | Freq: Every day | ORAL | 6 refills | Status: DC
  Filled 2022-01-26: qty 30, 30d supply, fill #0

## 2022-01-27 ENCOUNTER — Other Ambulatory Visit: Payer: Self-pay

## 2022-02-02 ENCOUNTER — Other Ambulatory Visit: Payer: Self-pay | Admitting: *Deleted

## 2022-02-02 ENCOUNTER — Other Ambulatory Visit: Payer: Self-pay

## 2022-02-02 MED ORDER — PANTOPRAZOLE SODIUM 40 MG PO TBEC
40.0000 mg | DELAYED_RELEASE_TABLET | Freq: Every day | ORAL | 3 refills | Status: DC
  Filled 2022-02-02 – 2022-02-09 (×2): qty 90, 90d supply, fill #0

## 2022-02-02 MED ORDER — GABAPENTIN 100 MG PO CAPS
100.0000 mg | ORAL_CAPSULE | Freq: Three times a day (TID) | ORAL | 0 refills | Status: DC
  Filled 2022-02-02: qty 90, 30d supply, fill #0

## 2022-02-02 MED ORDER — SPIRONOLACTONE 25 MG PO TABS: 25.0000 mg | ORAL_TABLET | Freq: Every day | ORAL | 3 refills | Status: DC

## 2022-02-02 MED ORDER — CARVEDILOL 12.5 MG PO TABS
12.5000 mg | ORAL_TABLET | Freq: Two times a day (BID) | ORAL | 0 refills | Status: DC
  Filled 2022-02-02: qty 60, 30d supply, fill #0

## 2022-02-02 MED ORDER — VALSARTAN 40 MG PO TABS
40.0000 mg | ORAL_TABLET | Freq: Every day | ORAL | 0 refills | Status: DC
  Filled 2022-02-02 – 2022-02-09 (×2): qty 30, 30d supply, fill #0

## 2022-02-02 MED ORDER — ISOSORBIDE MONONITRATE ER 30 MG PO TB24
30.0000 mg | ORAL_TABLET | Freq: Every day | ORAL | 3 refills | Status: DC
  Filled 2022-02-02: qty 90, 90d supply, fill #0

## 2022-02-09 ENCOUNTER — Other Ambulatory Visit: Payer: Self-pay

## 2022-02-09 ENCOUNTER — Other Ambulatory Visit: Payer: Self-pay | Admitting: *Deleted

## 2022-02-09 MED ORDER — INSULIN LISPRO (1 UNIT DIAL) 100 UNIT/ML (KWIKPEN)
5.0000 [IU] | PEN_INJECTOR | Freq: Two times a day (BID) | SUBCUTANEOUS | 1 refills | Status: DC
  Filled 2022-02-09: qty 3, 30d supply, fill #0

## 2022-02-09 MED ORDER — VALSARTAN 40 MG PO TABS
40.0000 mg | ORAL_TABLET | Freq: Every day | ORAL | 0 refills | Status: DC
Start: 2022-02-09 — End: 2022-03-16
  Filled 2022-02-09: qty 30, 30d supply, fill #0

## 2022-02-09 MED ORDER — INSULIN GLARGINE 100 UNIT/ML SOLOSTAR PEN
15.0000 [IU] | PEN_INJECTOR | Freq: Every day | SUBCUTANEOUS | 0 refills | Status: DC
  Filled 2022-02-09: qty 9, 60d supply, fill #0

## 2022-02-09 MED ORDER — SPIRONOLACTONE 25 MG PO TABS: 25.0000 mg | ORAL_TABLET | Freq: Every day | ORAL | 3 refills | Status: DC

## 2022-02-09 MED ORDER — ISOSORBIDE MONONITRATE ER 30 MG PO TB24
30.0000 mg | ORAL_TABLET | Freq: Every day | ORAL | 3 refills | Status: DC
  Filled 2022-02-09: qty 90, 90d supply, fill #0

## 2022-02-09 MED ORDER — GABAPENTIN 100 MG PO CAPS
100.0000 mg | ORAL_CAPSULE | Freq: Three times a day (TID) | ORAL | 0 refills | Status: DC
  Filled 2022-02-09: qty 90, 30d supply, fill #0

## 2022-02-10 ENCOUNTER — Other Ambulatory Visit: Payer: Self-pay

## 2022-02-14 ENCOUNTER — Encounter: Payer: Self-pay | Admitting: Critical Care Medicine

## 2022-02-14 ENCOUNTER — Other Ambulatory Visit: Payer: Self-pay

## 2022-02-14 ENCOUNTER — Ambulatory Visit: Payer: Medicaid Other | Attending: Critical Care Medicine | Admitting: Critical Care Medicine

## 2022-02-14 VITALS — BP 117/62 | HR 60 | Wt 130.4 lb

## 2022-02-14 DIAGNOSIS — I5042 Chronic combined systolic (congestive) and diastolic (congestive) heart failure: Secondary | ICD-10-CM | POA: Insufficient documentation

## 2022-02-14 DIAGNOSIS — F17210 Nicotine dependence, cigarettes, uncomplicated: Secondary | ICD-10-CM | POA: Diagnosis not present

## 2022-02-14 DIAGNOSIS — E1142 Type 2 diabetes mellitus with diabetic polyneuropathy: Secondary | ICD-10-CM | POA: Insufficient documentation

## 2022-02-14 DIAGNOSIS — R6889 Other general symptoms and signs: Secondary | ICD-10-CM | POA: Insufficient documentation

## 2022-02-14 DIAGNOSIS — Z794 Long term (current) use of insulin: Secondary | ICD-10-CM | POA: Diagnosis not present

## 2022-02-14 DIAGNOSIS — E785 Hyperlipidemia, unspecified: Secondary | ICD-10-CM | POA: Insufficient documentation

## 2022-02-14 DIAGNOSIS — M79605 Pain in left leg: Secondary | ICD-10-CM

## 2022-02-14 DIAGNOSIS — E611 Iron deficiency: Secondary | ICD-10-CM | POA: Diagnosis not present

## 2022-02-14 DIAGNOSIS — Z79899 Other long term (current) drug therapy: Secondary | ICD-10-CM | POA: Insufficient documentation

## 2022-02-14 DIAGNOSIS — K8689 Other specified diseases of pancreas: Secondary | ICD-10-CM | POA: Insufficient documentation

## 2022-02-14 DIAGNOSIS — I10 Essential (primary) hypertension: Secondary | ICD-10-CM

## 2022-02-14 DIAGNOSIS — Z0283 Encounter for blood-alcohol and blood-drug test: Secondary | ICD-10-CM | POA: Insufficient documentation

## 2022-02-14 DIAGNOSIS — I110 Hypertensive heart disease with heart failure: Secondary | ICD-10-CM | POA: Insufficient documentation

## 2022-02-14 DIAGNOSIS — Z720 Tobacco use: Secondary | ICD-10-CM

## 2022-02-14 DIAGNOSIS — E1169 Type 2 diabetes mellitus with other specified complication: Secondary | ICD-10-CM

## 2022-02-14 LAB — GLUCOSE, POCT (MANUAL RESULT ENTRY): POC Glucose: 138 mg/dl — AB (ref 70–99)

## 2022-02-14 LAB — POCT ABI - SCREENING FOR PILOT NO CHARGE
Left ABI: 0.95
Right ABI: 1.1

## 2022-02-14 MED ORDER — FERROUS SULFATE 325 (65 FE) MG PO TABS
325.0000 mg | ORAL_TABLET | Freq: Two times a day (BID) | ORAL | 3 refills | Status: DC
  Filled 2022-02-14 – 2022-02-15 (×2): qty 60, 30d supply, fill #0

## 2022-02-14 MED ORDER — PANCRELIPASE (LIP-PROT-AMYL) 6000-19000 UNITS PO CPEP
2.0000 | ORAL_CAPSULE | Freq: Two times a day (BID) | ORAL | 0 refills | Status: DC
  Filled 2022-02-14: qty 240, 60d supply, fill #0

## 2022-02-14 MED ORDER — CARVEDILOL 12.5 MG PO TABS
12.5000 mg | ORAL_TABLET | Freq: Two times a day (BID) | ORAL | 0 refills | Status: DC
Start: 2022-02-14 — End: 2022-03-02
  Filled 2022-02-14: qty 60, 30d supply, fill #0

## 2022-02-14 NOTE — Assessment & Plan Note (Signed)
Blood pressure at goal no changes ?

## 2022-02-14 NOTE — Progress Notes (Signed)
Established Patient Office Visit  Subjective:  Patient ID: Rodney Barber, male    DOB: 10-04-1965  Age: 56 y.o. MRN: [id number]  CC:  Chief Complaint  Patient presents with   Diabetes    HPI 09/2021 Rodney Barber presents for primary care follow-up.  Patient is currently at the [facility] but is about to get housing and now has full Medicaid and Social Security disability.  Patient on arrival has blood pressure 110/54 blood sugar of 96.  He is taking 25 units of insulin glargine in the morning and variably takes 5 units of Humalog lispro in the morning and occasionally a dose at lunch.  He states his blood sugars are in the low 100s fasting midmorning 175 range and in the late afternoon he does get over 200 on occasion.  On arrival hemoglobin A1c is 9.1 which is still markedly better than previous values of greater than 14 in the past.  He has no other real complaints at this visit.  The patient needs screening labs at this visit he is still smoking some cigarettes.  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  Patient needs refills on iron carvedilol and Creon  On arrival blood pressure is good 117/62 blood sugar 138 he states blood sugars at home anywhere from 1 60-1 80.  Patient is on 25 units of Lantus daily and takes the 5 units twice daily of Humalog  Patient now has housing and is awaiting his furniture that arrives this week.  He has no other complaints.  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   Anemia    Diabetes mellitus    Hypertension    Pancreatitis    Substance use disorder 02/18/2019    Past Surgical History:  Procedure  Laterality Date   ERCP  06/27/2011   Procedure: ENDOSCOPIC RETROGRADE CHOLANGIOPANCREATOGRAPHY (ERCP);  Surgeon: Jeryl Columbia, MD;  Location: [facility];  Service: Endoscopy;  Laterality: N/A;   ERCP W/ PLASTIC STENT PLACEMENT  03/2011   IR US GUIDE BX ASP/DRAIN  05/16/2019   LEFT HEART CATH AND CORONARY ANGIOGRAPHY N/A 01/02/2020   Procedure: LEFT HEART CATH AND CORONARY ANGIOGRAPHY;  Surgeon: Martinique, Peter M, MD;  Location: [facility];  Service: Cardiovascular;  Laterality: N/A;   TEE WITHOUT CARDIOVERSION N/A 05/20/2019   Procedure: TRANSESOPHAGEAL ECHOCARDIOGRAM (TEE);  Surgeon: [name], MD;  Location: [facility];  Service: Cardiovascular;  Laterality: N/A;    Family History  Problem Relation Age of Onset   Diabetes Mother    Diabetes Brother     Social History   Socioeconomic History   Marital status: Single    Spouse name: Not on file   Number of children: Not on file   Years of education: Not on file   Highest education level: Not on file  Occupational History   Not on file  Tobacco Use   Smoking status: Every Day    Packs/day: 0.50    Years: 30.00    Total pack years: 15.00    Types: Cigarettes   Smokeless tobacco: Never   Tobacco comments:    6 cigarett /day  Vaping Use   Vaping Use: Never used  Substance and Sexual Activity  Alcohol use: No   Drug use: Not Currently    Types: "Crack" cocaine, Other-see comments, Cocaine    Comment: last smoked crack 12-16-16   Sexual activity: Never  Other Topics Concern   Not on file  Social History Narrative   Not on file   Social Determinants of Health   Financial Resource Strain: Not on file  Food Insecurity: Not on file  Transportation Needs: Not on file  Physical Activity: Not on file  Stress: Not on file  Social Connections: Not on file  Intimate Partner Violence: Not on file    Outpatient Medications Prior to Visit  Medication Sig Dispense Refill   Accu-Chek Softclix Lancets lancets Ues  to check blood sugar 4 (four) times daily. 100 each 0   acetaminophen (TYLENOL) 325 MG tablet Take 2 tablets (650 mg total) by mouth every 6 (six) hours as needed for mild pain.     aspirin 81 MG chewable tablet HOLD due to Hematuria (Patient taking differently: Chew 81 mg by mouth daily.) 120 tablet 0   atorvastatin (LIPITOR) 80 MG tablet Take 1 tablet (80 mg total) by mouth daily at 6pm 30 tablet 6   Blood Glucose Monitoring Suppl (ACCU-CHEK GUIDE) w/Device KIT Use to check blood sugar four times daily. E11.65 1 kit 0   feeding supplement, GLUCERNA SHAKE, (GLUCERNA SHAKE) LIQD Take 237 mLs by mouth 3 (three) times daily between meals.  0   gabapentin (NEURONTIN) 100 MG capsule Take 1 capsule (100 mg total) by mouth 3 (three) times daily. 90 capsule 0   Glucose 4-6 GM-MG CHEW Chew one as needed for blood glucose < 70 (Patient taking differently: Chew 1 tablet by mouth 2 (two) times daily as needed (low glucose). Chew one as needed for blood glucose < 70) 100 tablet 0   glucose blood (ACCU-CHEK GUIDE) test strip Use to check blood sugar four times daily. 100 each 12   insulin glargine (LANTUS) 100 UNIT/ML Solostar Pen Inject 15 Units into the skin daily. (Patient taking differently: Inject 25 Units into the skin daily.) 9 mL 0   insulin lispro (HUMALOG) 100 UNIT/ML KwikPen Inject 5 Units into the skin 2 (two) times daily before a meal. Skip your dose if you do not eat or if your sugar is <150. 3 mL 1   Insulin Pen Needle 31G X 5 MM MISC USE WITH INSULIN PENS 100 each 6   isosorbide mononitrate (IMDUR) 30 MG 24 hr tablet Take 1 tablet (30 mg total) by mouth daily. 90 tablet 3   mometasone-formoterol (DULERA) 100-5 MCG/ACT AERO Inhale 2 puffs into the lungs 2 (two) times daily. Shake well Rinse mouth with water and spit following inhalation. 13 g 0   nicotine (NICODERM CQ - DOSED IN MG/24 HOURS) 14 mg/24hr patch Place 1 patch (14 mg total) onto the skin daily. 28 patch 0   nitroGLYCERIN (NITROSTAT) 0.4  MG SL tablet Place 1 tablet (0.4 mg total) under the tongue every 5 (five) minutes as needed for chest pain. 100 tablet 3   pantoprazole (PROTONIX) 40 MG tablet Take 1 tablet (40 mg total) by mouth daily. 90 tablet 3   valsartan (DIOVAN) 40 MG tablet Take 1 tablet (40 mg total) by mouth daily. 30 tablet 0   carvedilol (COREG) 12.5 MG tablet Take 1 tablet (12.5 mg total) by mouth 2 (two) times daily with a meal. 60 tablet 0   ferrous sulfate (FEROSUL) 325 (65 FE) MG tablet Take 1 tablet (325 mg total)  by mouth 2 (two) times daily with a meal. 180 tablet 3   furosemide (LASIX) 40 MG tablet HOLD 30 tablet 0   Pancrelipase, Lip-Prot-Amyl, 6000-19000 units CPEP Take 2 capsules (12,000 Units total) by mouth in the morning, at noon, and at bedtime. (Patient taking differently: Take 2 capsules by mouth. Twice a day before breakfast and dinner) 360 capsule 0   spironolactone (ALDACTONE) 25 MG tablet Take 1 tablet (25 mg total) by mouth daily. 90 tablet 3   No facility-administered medications prior to visit.    No Known Allergies  ROS Review of Systems  Constitutional: Negative.   HENT: Negative.  Negative for ear pain, postnasal drip, rhinorrhea, sinus pressure, sore throat, trouble swallowing and voice change.   Eyes: Negative.   Respiratory: Negative.  Negative for apnea, cough, choking, chest tightness, shortness of breath, wheezing and stridor.   Cardiovascular: Negative.  Negative for chest pain, palpitations and leg swelling.  Gastrointestinal: Negative.  Negative for abdominal distention, abdominal pain, nausea and vomiting.  Genitourinary: Negative.   Musculoskeletal: Negative.  Negative for arthralgias and myalgias.  Skin: Negative.  Negative for rash.  Allergic/Immunologic: Negative.  Negative for environmental allergies and food allergies.  Neurological: Negative.  Negative for dizziness, syncope, weakness and headaches.  Hematological: Negative.  Negative for adenopathy. Does not  bruise/bleed easily.  Psychiatric/Behavioral: Negative.  Negative for agitation and sleep disturbance. The patient is not nervous/anxious.       Objective:    Physical Exam Vitals reviewed.  Constitutional:      Appearance: Normal appearance. He is well-developed. He is not diaphoretic.  HENT:     Head: Normocephalic and atraumatic.     Nose: No nasal deformity, septal deviation, mucosal edema or rhinorrhea.     Right Sinus: No maxillary sinus tenderness or frontal sinus tenderness.     Left Sinus: No maxillary sinus tenderness or frontal sinus tenderness.     Mouth/Throat:     Pharynx: No oropharyngeal exudate.  Eyes:     General: No scleral icterus.    Conjunctiva/sclera: Conjunctivae normal.     Pupils: Pupils are equal, round, and reactive to light.  Neck:     Thyroid: No thyromegaly.     Vascular: No carotid bruit or JVD.     Trachea: Trachea normal. No tracheal tenderness or tracheal deviation.  Cardiovascular:     Rate and Rhythm: Normal rate and regular rhythm.     Chest Wall: PMI is not displaced.     Pulses: Normal pulses. No decreased pulses.     Heart sounds: Normal heart sounds, S1 normal and S2 normal. Heart sounds not distant. No murmur heard.    No systolic murmur is present.     No diastolic murmur is present.     No friction rub. No gallop. No S3 or S4 sounds.  Pulmonary:     Effort: No tachypnea, accessory muscle usage or respiratory distress.     Breath sounds: No stridor. No decreased breath sounds, wheezing, rhonchi or rales.  Chest:     Chest wall: No tenderness.  Abdominal:     General: Bowel sounds are normal. There is no distension.     Palpations: Abdomen is soft. Abdomen is not rigid.     Tenderness: There is no abdominal tenderness. There is no guarding or rebound.  Musculoskeletal:        General: Normal range of motion.     Cervical back: Normal range of motion and neck supple. No edema, erythema  or rigidity. No muscular tenderness. Normal  range of motion.  Lymphadenopathy:     Head:     Right side of head: No submental or submandibular adenopathy.     Left side of head: No submental or submandibular adenopathy.     Cervical: No cervical adenopathy.  Skin:    General: Skin is warm and dry.     Coloration: Skin is not pale.     Findings: No rash.     Nails: There is no clubbing.  Neurological:     Mental Status: He is alert and oriented to person, place, and time.     Sensory: No sensory deficit.  Psychiatric:        Speech: Speech normal.        Behavior: Behavior normal.     BP 117/62   Pulse 60   Wt 130 lb 6.4 oz (59.1 kg)   SpO2 100%   BMI 19.83 kg/m  Wt Readings from Last 3 Encounters:  02/14/22 130 lb 6.4 oz (59.1 kg)  12/07/21 132 lb 12.8 oz (60.2 kg)  12/06/21 127 lb 10.3 oz (57.9 kg)   ABIs done and shows decreased circulation left lower extremity  Health Maintenance Due  Topic Date Due   Zoster Vaccines- Shingrix (1 of 2) Never done    There are no preventive care reminders to display for this patient.  Lab Results  Component Value Date   TSH 2.097 10/21/2019   Lab Results  Component Value Date   WBC 12.5 (H) 12/04/2021   HGB 9.2 (L) 12/04/2021   HCT 28.3 (L) 12/04/2021   MCV 92.2 12/04/2021   PLT 199 12/04/2021   Lab Results  Component Value Date   NA 140 12/06/2021   K 3.9 12/06/2021   CO2 25 12/06/2021   GLUCOSE 160 (H) 12/06/2021   BUN 22 (H) 12/06/2021   CREATININE 1.33 (H) 12/06/2021   BILITOT 0.5 12/03/2021   ALKPHOS 66 12/03/2021   AST 130 (H) 12/03/2021   ALT 30 12/03/2021   PROT 6.0 (L) 12/03/2021   ALBUMIN 2.7 (L) 12/03/2021   CALCIUM 7.8 (L) 12/06/2021   ANIONGAP 6 12/06/2021   EGFR 53 (L) 09/27/2021   Lab Results  Component Value Date   CHOL 97 (L) 09/27/2021   Lab Results  Component Value Date   HDL 35 (L) 09/27/2021   Lab Results  Component Value Date   LDLCALC 48 09/27/2021   Lab Results  Component Value Date   TRIG 63 09/27/2021   Lab  Results  Component Value Date   CHOLHDL 2.8 09/27/2021   Lab Results  Component Value Date   HGBA1C 7.9 (H) 12/04/2021      Assessment & Plan:   Problem List Items Addressed This Visit       Cardiovascular and Mediastinum   Chronic combined systolic (congestive) and diastolic (congestive) heart failure (HCC)    Compensated heart failure no changes      Relevant Medications   carvedilol (COREG) 12.5 MG tablet   Essential hypertension    Blood pressure at goal no changes      Relevant Medications   carvedilol (COREG) 12.5 MG tablet     Endocrine   Hyperlipidemia associated with type 2 diabetes mellitus (HCC)   Relevant Medications   carvedilol (COREG) 12.5 MG tablet   Other Relevant Orders   Lipid panel   Type 2 diabetes mellitus ([place]) - Primary    Type 1.5 diabetes does not have a pancreas its  functional continue insulin at current dose levels      Relevant Orders   POCT glucose (manual entry) (Completed)   Comprehensive metabolic panel   Hemoglobin A1c     Other   Tobacco abuse       Current smoking consumption amount: 4 cigarettes daily  Dicsussion on advise to quit smoking and smoking impacts: Impact on cardiovascular health  Patient's willingness to quit: Willing to cut back but not quit  Methods to quit smoking discussed: Behavioral modification cannot use nicotine replacement therapy due to vascular disease  Medication management of smoking session drugs discussed: Not a candidate for medication management Chantix is on backorder  Resources provided:  AVS   Setting quit date not yet established  Follow-up arranged 2 months    Time spent counseling the patient: 5 minutes       Abnormal ankle brachial index (ABI)    Referral to vascular surgery made      Relevant Orders   Ambulatory referral to Vascular Surgery   Iron deficiency    Continue iron supplement      Relevant Orders   CBC with Differential/Platelet   Iron, TIBC and  Ferritin Panel   Encounter for drug screening    Doubt drug use we will do screening patient is agreeable      Relevant Orders   [id number] 11+Oxyco+Alc+Crt-Bund   Other Visit Diagnoses     Pain of left lower extremity       Relevant Orders   POCT ABI Screening Pilot No Charge      Meds ordered this encounter  Medications   carvedilol (COREG) 12.5 MG tablet    Sig: Take 1 tablet (12.5 mg total) by mouth 2 (two) times daily with a meal.    Dispense:  60 tablet    Refill:  0   ferrous sulfate (FEROSUL) 325 (65 FE) MG tablet    Sig: Take 1 tablet (325 mg total) by mouth 2 (two) times daily with a meal.    Dispense:  60 tablet    Refill:  3   Pancrelipase, Lip-Prot-Amyl, 6000-19000 units CPEP    Sig: Take 2 capsules (12,000 Units total) by mouth 2 (two) times daily before a meal. Twice a day before breakfast and dinner    Dispense:  240 capsule    Refill:  0    Follow-up: Return in about 2 months (around 04/17/2022).    Asencion Noble, MD

## 2022-02-14 NOTE — Assessment & Plan Note (Signed)
  .   Current smoking consumption amount: 4 cigarettes daily  . Dicsussion on advise to quit smoking and smoking impacts: Impact on cardiovascular health  . Patient's willingness to quit: Willing to cut back but not quit  . Methods to quit smoking discussed: Behavioral modification cannot use nicotine replacement therapy due to vascular disease  . Medication management of smoking session drugs discussed: Not a candidate for medication management Chantix is on backorder  . Resources provided:  AVS   . Setting quit date not yet established  . Follow-up arranged 2 months    Time spent counseling the patient: 5 minutes

## 2022-02-14 NOTE — Patient Instructions (Addendum)
Refill on carvedilol Creon and iron sent to pharmacy downstairs please pick up today  Arterial brachial index screening was abnormal a vascular surgery referral was made  No change in your medications  Screening labs will be obtained at this visit  We refilled your pill organizer  Return to Dr. [name] 3 months  Routine urine drug screen was included in labs today

## 2022-02-14 NOTE — Assessment & Plan Note (Signed)
Compensated heart failure no changes  

## 2022-02-14 NOTE — Assessment & Plan Note (Signed)
Referral to vascular surgery made 

## 2022-02-14 NOTE — Assessment & Plan Note (Signed)
Doubt drug use we will do screening patient is agreeable

## 2022-02-14 NOTE — Assessment & Plan Note (Signed)
Continue iron supplement.

## 2022-02-14 NOTE — Assessment & Plan Note (Signed)
Type 1.5 diabetes does not have a pancreas its functional continue insulin at current dose levels

## 2022-02-15 ENCOUNTER — Other Ambulatory Visit: Payer: Self-pay

## 2022-02-15 LAB — COMPREHENSIVE METABOLIC PANEL
ALT: 63 IU/L — ABNORMAL HIGH (ref 0–44)
AST: 44 IU/L — ABNORMAL HIGH (ref 0–40)
Albumin/Globulin Ratio: 1.9 (ref 1.2–2.2)
Albumin: 3.7 g/dL — ABNORMAL LOW (ref 3.8–4.9)
Alkaline Phosphatase: 94 IU/L (ref 44–121)
BUN/Creatinine Ratio: 10 (ref 9–20)
BUN: 18 mg/dL (ref 6–24)
Bilirubin Total: <0.2 mg/dL (ref 0.0–1.2)
CO2: 20 mmol/L (ref 20–29)
Calcium: 9.1 mg/dL (ref 8.7–10.2)
Chloride: 108 mmol/L — ABNORMAL HIGH (ref 96–106)
Creatinine, Ser: 1.76 mg/dL — ABNORMAL HIGH (ref 0.76–1.27)
Globulin, Total: 2 g/dL (ref 1.5–4.5)
Glucose: 65 mg/dL — ABNORMAL LOW (ref 70–99)
Potassium: 3.6 mmol/L (ref 3.5–5.2)
Sodium: 141 mmol/L (ref 134–144)
Total Protein: 5.7 g/dL — ABNORMAL LOW (ref 6.0–8.5)
eGFR: 45 mL/min/{1.73_m2} — ABNORMAL LOW (ref >59)

## 2022-02-15 LAB — CBC WITH DIFFERENTIAL/PLATELET
Basophils Absolute: 0.1 x10E3/uL (ref 0.0–0.2)
Basos: 1 %
EOS (ABSOLUTE): 0.2 x10E3/uL (ref 0.0–0.4)
Eos: 3 %
Hematocrit: 28.9 % — ABNORMAL LOW (ref 37.5–51.0)
Hemoglobin: 9.6 g/dL — ABNORMAL LOW (ref 13.0–17.7)
Immature Grans (Abs): 0 x10E3/uL (ref 0.0–0.1)
Immature Granulocytes: 0 %
Lymphocytes Absolute: 1.8 x10E3/uL (ref 0.7–3.1)
Lymphs: 24 %
MCH: 30 pg (ref 26.6–33.0)
MCHC: 33.2 g/dL (ref 31.5–35.7)
MCV: 90 fL (ref 79–97)
Monocytes Absolute: 0.5 x10E3/uL (ref 0.1–0.9)
Monocytes: 7 %
Neutrophils Absolute: 5.1 x10E3/uL (ref 1.4–7.0)
Neutrophils: 65 %
Platelets: 167 x10E3/uL (ref 150–450)
RBC: 3.2 x10E6/uL — ABNORMAL LOW (ref 4.14–5.80)
RDW: 13.4 % (ref 11.6–15.4)
WBC: 7.6 x10E3/uL (ref 3.4–10.8)

## 2022-02-15 LAB — LIPID PANEL
Chol/HDL Ratio: 2.9 ratio (ref 0.0–5.0)
Cholesterol, Total: 109 mg/dL (ref 100–199)
HDL: 38 mg/dL — ABNORMAL LOW
LDL Chol Calc (NIH): 56 mg/dL (ref 0–99)
Triglycerides: 75 mg/dL (ref 0–149)
VLDL Cholesterol Cal: 15 mg/dL (ref 5–40)

## 2022-02-15 LAB — DRUG SCREEN 764883 11+OXYCO+ALC+CRT-BUND
Amphetamines, Urine: NEGATIVE ng/mL
BENZODIAZ UR QL: NEGATIVE ng/mL
Barbiturate: NEGATIVE ng/mL
Cannabinoid Quant, Ur: NEGATIVE ng/mL
Cocaine (Metabolite): NEGATIVE ng/mL
Creatinine: 319.1 mg/dL — ABNORMAL HIGH (ref 20.0–300.0)
Ethanol: NEGATIVE %
Meperidine: NEGATIVE ng/mL
Methadone Screen, Urine: NEGATIVE ng/mL
OPIATE SCREEN URINE: NEGATIVE ng/mL
Oxycodone/Oxymorphone, Urine: NEGATIVE ng/mL
Phencyclidine: NEGATIVE ng/mL
Propoxyphene: NEGATIVE ng/mL
Tramadol: NEGATIVE ng/mL
pH, Urine: 5 (ref 4.5–8.9)

## 2022-02-15 LAB — IRON,TIBC AND FERRITIN PANEL
Ferritin: 405 ng/mL — ABNORMAL HIGH (ref 30–400)
Iron Saturation: 29 % (ref 15–55)
Iron: 60 ug/dL (ref 38–169)
Total Iron Binding Capacity: 206 ug/dL — ABNORMAL LOW (ref 250–450)
UIBC: 146 ug/dL (ref 111–343)

## 2022-02-15 LAB — HEMOGLOBIN A1C
Est. average glucose Bld gHb Est-mCnc: 183 mg/dL
Hgb A1c MFr Bld: 8 % — ABNORMAL HIGH (ref 4.8–5.6)

## 2022-02-16 ENCOUNTER — Telehealth: Payer: Self-pay

## 2022-02-16 NOTE — Telephone Encounter (Signed)
-----   Message from [name], MD sent at 02/16/2022  5:17 AM EDT ----- Let pt know drug screen is NEG.  He no longer is iron deficient.  He can Stop Iron for now, kidney and liver stable,blood count better, cholesterol at goal, A1C best it has been past two months holding steady at 8.0

## 2022-02-16 NOTE — Telephone Encounter (Signed)
Pt was called and vm was left, Information has been sent to nurse pool.   

## 2022-03-02 ENCOUNTER — Other Ambulatory Visit: Payer: Self-pay

## 2022-03-02 ENCOUNTER — Other Ambulatory Visit: Payer: Self-pay | Admitting: *Deleted

## 2022-03-02 MED ORDER — ATORVASTATIN CALCIUM 80 MG PO TABS
80.0000 mg | ORAL_TABLET | Freq: Every day | ORAL | 6 refills | Status: DC
  Filled 2022-03-02 – 2022-03-09 (×2): qty 30, 30d supply, fill #0

## 2022-03-02 MED ORDER — CARVEDILOL 12.5 MG PO TABS
12.5000 mg | ORAL_TABLET | Freq: Two times a day (BID) | ORAL | 0 refills | Status: DC
  Filled 2022-03-02 – 2022-03-09 (×2): qty 60, 30d supply, fill #0

## 2022-03-05 NOTE — Progress Notes (Unsigned)
Established Patient Office Visit  Subjective:  Patient ID: Rodney Barber, male    DOB: 1966-05-18  Age: 56 y.o. MRN: [id number]  CC:  No chief complaint on file.   HPI 09/2021 Rodney Barber presents for primary care follow-up.  Patient is currently at the [facility] but is about to get housing and now has full Medicaid and Social Security disability.  Patient on arrival has blood pressure 110/54 blood sugar of 96.  He is taking 25 units of insulin glargine in the morning and variably takes 5 units of Humalog lispro in the morning and occasionally a dose at lunch.  He states his blood sugars are in the low 100s fasting midmorning 175 range and in the late afternoon he does get over 200 on occasion.  On arrival hemoglobin A1c is 9.1 which is still markedly better than previous values of greater than 14 in the past.  He has no other real complaints at this visit.  The patient needs screening labs at this visit he is still smoking some cigarettes.  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  Patient needs refills on iron carvedilol and Creon  On arrival blood pressure is good 117/62 blood sugar 138 he states blood sugars at home anywhere from 1 60-1 80.  Patient is on 25 units of Lantus daily and takes the 5 units twice daily of Humalog  Patient now has housing and is awaiting his furniture that arrives this week.  He has no other complaints.  Patient is ambulatory with a cane  7/31  Cardiovascular and Mediastinum   Chronic combined systolic (congestive) and diastolic (congestive) heart failure (HCC)    Compensated heart failure no changes      Relevant Medications   carvedilol (COREG) 12.5 MG tablet   Essential hypertension     Blood pressure at goal no changes      Relevant Medications   carvedilol (COREG) 12.5 MG tablet     Endocrine   Hyperlipidemia associated with type 2 diabetes mellitus (HCC)   Relevant Medications   carvedilol (COREG) 12.5 MG tablet   Other Relevant Orders   Lipid panel   Type 2 diabetes mellitus ([place]) - Primary    Type 1.5 diabetes does not have a pancreas its functional continue insulin at current dose levels      Relevant Orders   POCT glucose (manual entry) (Completed)   Comprehensive metabolic panel   Hemoglobin A1c     Other   Tobacco abuse       Current smoking consumption amount: 4 cigarettes daily  Dicsussion on advise to quit smoking and smoking impacts: Impact on cardiovascular health  Patient's willingness to quit: Willing to cut back but not quit  Methods to quit smoking discussed: Behavioral modification cannot use nicotine replacement therapy due to vascular disease  Medication management of smoking session drugs discussed: Not a candidate for medication management Chantix is on backorder  Resources provided:  AVS   Setting quit date not yet established  Follow-up arranged 2 months    Time spent counseling the patient: 5 minutes       Abnormal ankle brachial index (ABI)    Referral to vascular surgery made      Relevant Orders   Ambulatory referral to Vascular Surgery  Iron deficiency    Continue iron supplement      Relevant Orders   CBC with Differential/Platelet   Iron, TIBC and Ferritin Panel   Encounter for drug screening    Doubt drug use we will do screening patient is agreeable      Relevant Orders   [id number] 11+Oxyco+Alc+Crt-Bund   Other Visit Diagnoses     Pain of left lower extremity       Relevant Orders   POCT ABI Screening Pilot No Charge       Past Medical History:  Diagnosis Date   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   Anemia    Diabetes mellitus    Hypertension    Pancreatitis     Substance use disorder 02/18/2019    Past Surgical History:  Procedure Laterality Date   ERCP  06/27/2011   Procedure: ENDOSCOPIC RETROGRADE CHOLANGIOPANCREATOGRAPHY (ERCP);  Surgeon: Jeryl Columbia, MD;  Location: [facility];  Service: Endoscopy;  Laterality: N/A;   ERCP W/ PLASTIC STENT PLACEMENT  03/2011   IR US GUIDE BX ASP/DRAIN  05/16/2019   LEFT HEART CATH AND CORONARY ANGIOGRAPHY N/A 01/02/2020   Procedure: LEFT HEART CATH AND CORONARY ANGIOGRAPHY;  Surgeon: Martinique, Peter M, MD;  Location: [facility];  Service: Cardiovascular;  Laterality: N/A;   TEE WITHOUT CARDIOVERSION N/A 05/20/2019   Procedure: TRANSESOPHAGEAL ECHOCARDIOGRAM (TEE);  Surgeon: [name], MD;  Location: [facility];  Service: Cardiovascular;  Laterality: N/A;    Family History  Problem Relation Age of Onset   Diabetes Mother    Diabetes Brother     Social History   Socioeconomic History   Marital status: Single    Spouse name: Not on file   Number of children: Not on file   Years of education: Not on file   Highest education level: Not on file  Occupational History   Not on file  Tobacco Use   Smoking status: Every Day    Packs/day: 0.50    Years: 30.00    Total pack years: 15.00    Types: Cigarettes   Smokeless tobacco: Never   Tobacco comments:    6 cigarett /day  Vaping Use   Vaping Use: Never used  Substance and Sexual Activity   Alcohol use: No   Drug use: Not Currently    Types: "Crack" cocaine, Other-see comments, Cocaine    Comment: last smoked crack 12-16-16   Sexual activity: Never  Other Topics Concern   Not on file  Social History Narrative   Not on file   Social Determinants of Health   Financial Resource Strain: Not on file  Food Insecurity: Not on file  Transportation Needs: Not on file  Physical Activity: Not on file  Stress: Not on file  Social Connections: Not on file  Intimate Partner Violence: Not on file    Outpatient Medications Prior to Visit   Medication Sig Dispense Refill   Accu-Chek Softclix Lancets lancets Ues to check blood sugar 4 (four) times daily. 100 each 0   acetaminophen (TYLENOL) 325 MG tablet Take 2 tablets (650 mg total) by mouth every 6 (six) hours as needed for mild pain.     aspirin 81 MG chewable tablet HOLD due to Hematuria (Patient taking differently: Chew 81 mg by mouth daily.) 120 tablet 0   atorvastatin (LIPITOR) 80 MG tablet Take 1 tablet (80 mg total) by mouth daily at 6pm 30 tablet 6   Blood Glucose Monitoring Suppl (ACCU-CHEK GUIDE) w/Device KIT  Use to check blood sugar four times daily. E11.65 1 kit 0   carvedilol (COREG) 12.5 MG tablet Take 1 tablet (12.5 mg total) by mouth 2 (two) times daily with a meal. 60 tablet 0   feeding supplement, GLUCERNA SHAKE, (GLUCERNA SHAKE) LIQD Take 237 mLs by mouth 3 (three) times daily between meals.  0   ferrous sulfate (FEROSUL) 325 (65 FE) MG tablet Take 1 tablet (325 mg total) by mouth 2 (two) times daily with a meal. 60 tablet 3   gabapentin (NEURONTIN) 100 MG capsule Take 1 capsule (100 mg total) by mouth 3 (three) times daily. 90 capsule 0   Glucose 4-6 GM-MG CHEW Chew one as needed for blood glucose < 70 (Patient taking differently: Chew 1 tablet by mouth 2 (two) times daily as needed (low glucose). Chew one as needed for blood glucose < 70) 100 tablet 0   glucose blood (ACCU-CHEK GUIDE) test strip Use to check blood sugar four times daily. 100 each 12   insulin glargine (LANTUS) 100 UNIT/ML Solostar Pen Inject 15 Units into the skin daily. (Patient taking differently: Inject 25 Units into the skin daily.) 9 mL 0   insulin lispro (HUMALOG) 100 UNIT/ML KwikPen Inject 5 Units into the skin 2 (two) times daily before a meal. Skip your dose if you do not eat or if your sugar is <150. 3 mL 1   Insulin Pen Needle 31G X 5 MM MISC USE WITH INSULIN PENS 100 each 6   isosorbide mononitrate (IMDUR) 30 MG 24 hr tablet Take 1 tablet (30 mg total) by mouth daily. 90 tablet 3    mometasone-formoterol (DULERA) 100-5 MCG/ACT AERO Inhale 2 puffs into the lungs 2 (two) times daily. Shake well Rinse mouth with water and spit following inhalation. 13 g 0   nicotine (NICODERM CQ - DOSED IN MG/24 HOURS) 14 mg/24hr patch Place 1 patch (14 mg total) onto the skin daily. 28 patch 0   nitroGLYCERIN (NITROSTAT) 0.4 MG SL tablet Place 1 tablet (0.4 mg total) under the tongue every 5 (five) minutes as needed for chest pain. 100 tablet 3   Pancrelipase, Lip-Prot-Amyl, 6000-19000 units CPEP Take 2 capsules (12,000 Units total) by mouth 2 (two) times daily before a meal. Twice a day before breakfast and dinner 240 capsule 0   pantoprazole (PROTONIX) 40 MG tablet Take 1 tablet (40 mg total) by mouth daily. 90 tablet 3   valsartan (DIOVAN) 40 MG tablet Take 1 tablet (40 mg total) by mouth daily. 30 tablet 0   No facility-administered medications prior to visit.    No Known Allergies  ROS Review of Systems  Constitutional: Negative.   HENT: Negative.  Negative for ear pain, postnasal drip, rhinorrhea, sinus pressure, sore throat, trouble swallowing and voice change.   Eyes: Negative.   Respiratory: Negative.  Negative for apnea, cough, choking, chest tightness, shortness of breath, wheezing and stridor.   Cardiovascular: Negative.  Negative for chest pain, palpitations and leg swelling.  Gastrointestinal: Negative.  Negative for abdominal distention, abdominal pain, nausea and vomiting.  Genitourinary: Negative.   Musculoskeletal: Negative.  Negative for arthralgias and myalgias.  Skin: Negative.  Negative for rash.  Allergic/Immunologic: Negative.  Negative for environmental allergies and food allergies.  Neurological: Negative.  Negative for dizziness, syncope, weakness and headaches.  Hematological: Negative.  Negative for adenopathy. Does not bruise/bleed easily.  Psychiatric/Behavioral: Negative.  Negative for agitation and sleep disturbance. The patient is not nervous/anxious.        Objective:  Physical Exam Vitals reviewed.  Constitutional:      Appearance: Normal appearance. He is well-developed. He is not diaphoretic.  HENT:     Head: Normocephalic and atraumatic.     Nose: No nasal deformity, septal deviation, mucosal edema or rhinorrhea.     Right Sinus: No maxillary sinus tenderness or frontal sinus tenderness.     Left Sinus: No maxillary sinus tenderness or frontal sinus tenderness.     Mouth/Throat:     Pharynx: No oropharyngeal exudate.  Eyes:     General: No scleral icterus.    Conjunctiva/sclera: Conjunctivae normal.     Pupils: Pupils are equal, round, and reactive to light.  Neck:     Thyroid: No thyromegaly.     Vascular: No carotid bruit or JVD.     Trachea: Trachea normal. No tracheal tenderness or tracheal deviation.  Cardiovascular:     Rate and Rhythm: Normal rate and regular rhythm.     Chest Wall: PMI is not displaced.     Pulses: Normal pulses. No decreased pulses.     Heart sounds: Normal heart sounds, S1 normal and S2 normal. Heart sounds not distant. No murmur heard.    No systolic murmur is present.     No diastolic murmur is present.     No friction rub. No gallop. No S3 or S4 sounds.  Pulmonary:     Effort: No tachypnea, accessory muscle usage or respiratory distress.     Breath sounds: No stridor. No decreased breath sounds, wheezing, rhonchi or rales.  Chest:     Chest wall: No tenderness.  Abdominal:     General: Bowel sounds are normal. There is no distension.     Palpations: Abdomen is soft. Abdomen is not rigid.     Tenderness: There is no abdominal tenderness. There is no guarding or rebound.  Musculoskeletal:        General: Normal range of motion.     Cervical back: Normal range of motion and neck supple. No edema, erythema or rigidity. No muscular tenderness. Normal range of motion.  Lymphadenopathy:     Head:     Right side of head: No submental or submandibular adenopathy.     Left side of head: No  submental or submandibular adenopathy.     Cervical: No cervical adenopathy.  Skin:    General: Skin is warm and dry.     Coloration: Skin is not pale.     Findings: No rash.     Nails: There is no clubbing.  Neurological:     Mental Status: He is alert and oriented to person, place, and time.     Sensory: No sensory deficit.  Psychiatric:        Speech: Speech normal.        Behavior: Behavior normal.     There were no vitals taken for this visit. Wt Readings from Last 3 Encounters:  02/14/22 130 lb 6.4 oz (59.1 kg)  12/07/21 132 lb 12.8 oz (60.2 kg)  12/06/21 127 lb 10.3 oz (57.9 kg)   ABIs done and shows decreased circulation left lower extremity  Health Maintenance Due  Topic Date Due   Zoster Vaccines- Shingrix (1 of 2) Never done    There are no preventive care reminders to display for this patient.  Lab Results  Component Value Date   TSH 2.097 10/21/2019   Lab Results  Component Value Date   WBC 7.6 02/14/2022   HGB 9.6 (L) 02/14/2022  HCT 28.9 (L) 02/14/2022   MCV 90 02/14/2022   PLT 167 02/14/2022   Lab Results  Component Value Date   NA 141 02/14/2022   K 3.6 02/14/2022   CO2 20 02/14/2022   GLUCOSE 65 (L) 02/14/2022   BUN 18 02/14/2022   CREATININE 1.76 (H) 02/14/2022   BILITOT <0.2 02/14/2022   ALKPHOS 94 02/14/2022   AST 44 (H) 02/14/2022   ALT 63 (H) 02/14/2022   PROT 5.7 (L) 02/14/2022   ALBUMIN 3.7 (L) 02/14/2022   CALCIUM 9.1 02/14/2022   ANIONGAP 6 12/06/2021   EGFR 45 (L) 02/14/2022   Lab Results  Component Value Date   CHOL 109 02/14/2022   Lab Results  Component Value Date   HDL 38 (L) 02/14/2022   Lab Results  Component Value Date   LDLCALC 56 02/14/2022   Lab Results  Component Value Date   TRIG 75 02/14/2022   Lab Results  Component Value Date   CHOLHDL 2.9 02/14/2022   Lab Results  Component Value Date   HGBA1C 8.0 (H) 02/14/2022      Assessment & Plan:   Problem List Items Addressed This Visit    None No orders of the defined types were placed in this encounter.   Follow-up: No follow-ups on file.    Asencion Noble, MD

## 2022-03-06 ENCOUNTER — Other Ambulatory Visit: Payer: Self-pay

## 2022-03-06 ENCOUNTER — Ambulatory Visit: Payer: Medicaid Other | Attending: Critical Care Medicine | Admitting: Critical Care Medicine

## 2022-03-06 ENCOUNTER — Encounter: Payer: Self-pay | Admitting: Critical Care Medicine

## 2022-03-06 ENCOUNTER — Other Ambulatory Visit: Payer: Self-pay | Admitting: *Deleted

## 2022-03-06 VITALS — BP 175/80 | HR 55 | Ht 68.0 in | Wt 130.2 lb

## 2022-03-06 DIAGNOSIS — E1169 Type 2 diabetes mellitus with other specified complication: Secondary | ICD-10-CM

## 2022-03-06 DIAGNOSIS — R6889 Other general symptoms and signs: Secondary | ICD-10-CM

## 2022-03-06 DIAGNOSIS — H539 Unspecified visual disturbance: Secondary | ICD-10-CM

## 2022-03-06 DIAGNOSIS — Z794 Long term (current) use of insulin: Secondary | ICD-10-CM

## 2022-03-06 DIAGNOSIS — E1142 Type 2 diabetes mellitus with diabetic polyneuropathy: Secondary | ICD-10-CM

## 2022-03-06 DIAGNOSIS — I10 Essential (primary) hypertension: Secondary | ICD-10-CM

## 2022-03-06 DIAGNOSIS — N200 Calculus of kidney: Secondary | ICD-10-CM

## 2022-03-06 DIAGNOSIS — F17210 Nicotine dependence, cigarettes, uncomplicated: Secondary | ICD-10-CM

## 2022-03-06 DIAGNOSIS — I5042 Chronic combined systolic (congestive) and diastolic (congestive) heart failure: Secondary | ICD-10-CM

## 2022-03-06 DIAGNOSIS — Z720 Tobacco use: Secondary | ICD-10-CM

## 2022-03-06 DIAGNOSIS — E785 Hyperlipidemia, unspecified: Secondary | ICD-10-CM

## 2022-03-06 LAB — GLUCOSE, POCT (MANUAL RESULT ENTRY): POC Glucose: 356 mg/dl — AB (ref 70–99)

## 2022-03-06 MED ORDER — BLOOD PRESSURE KIT DEVI
0 refills | Status: DC
  Filled 2022-03-06: qty 1, fill #0

## 2022-03-06 MED ORDER — BLOOD PRESSURE KIT DEVI: 0 refills | Status: AC

## 2022-03-06 NOTE — Assessment & Plan Note (Signed)
Today's BP 175/80, however patient did not take medications today Needs home blood pressure machine to monitor, ordered today  Continue current medication regimen Will recheck when patient is compliant with medications at follow up

## 2022-03-06 NOTE — Assessment & Plan Note (Signed)
Compensated heart failure no changes

## 2022-03-06 NOTE — Assessment & Plan Note (Signed)
18 mm calculus in the right renal pelvis, hydronephrosis, and hydrocele  Has previously been evaluated by urology who wanted to proceed with surgery Was unable to schedule with patient Referral sent again, patient instructed to answer phone call

## 2022-03-06 NOTE — Assessment & Plan Note (Signed)
About 3 cigarettes a day Cessation encouraged

## 2022-03-06 NOTE — Patient Instructions (Signed)
Referral to eye doctor will be made  Referral to urology for your kidney stones to be made  Keep your upcoming appointment in August with vascular surgery  Please take your medications every day as prescribed no medication dosings changes will be made  Return to see Dr. [name] 3 months

## 2022-03-06 NOTE — Assessment & Plan Note (Signed)
Abnormal ABI and referred to Vascular Surgery at previous visit Has upcoming appointment with them 08/24  Patient reminded of this

## 2022-03-06 NOTE — Assessment & Plan Note (Signed)
Per patient, has history of cataracts Referral to opthalmology sent

## 2022-03-06 NOTE — Assessment & Plan Note (Addendum)
-  Continue high-dose atorvastatin 

## 2022-03-06 NOTE — Assessment & Plan Note (Addendum)
Type 1.5 diabetes does not have a pancreas its functional continue insulin at current dose levels  Continue blood sugar checks at home  Continue current medication regimen

## 2022-03-06 NOTE — Progress Notes (Unsigned)
Established Patient Office Visit  Subjective   Patient ID: Rodney Barber, male    DOB: 14-Sep-1965  Age: 56 y.o. MRN: [id number]  Chief Complaint  Patient presents with   leg cramping     09/2021 [name] presents for primary care follow-up.  [facility] is [facility] to get housing and now has full Medicaid and Social Security disability.  Patient on arrival has blood pressure 110/54 blood sugar of 96.  He is taking 25 units of insulin glargine in the morning and variably takes 5 units of Humalog lispro in the morning and occasionally a dose at lunch.  He states his blood sugars are in the low 100s fasting midmorning 175 range and in the late afternoon he does get over 200 on occasion.  On arrival hemoglobin A1c is 9.1 which is still markedly better than previous values of greater than 14 in the past.  He has no other real complaints at this visit.   The patient needs screening labs at this visit he is still smoking some cigarettes.   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  Patient needs refills on iron carvedilol and Creon  On arrival blood pressure is good 117/62 blood sugar 138 he states blood sugars at home anywhere from 1 60-1 80.  Patient is on 25 units of Lantus daily and takes the 5 units twice daily of Humalog  Patient now has housing and is awaiting his furniture that arrives this week.  He has no other complaints.  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 He is on Creon pancreatic enzyme replacement.  At the previous visit,  he complained of left claudication. ABI was abnormal. He was referred to vascular surgery and has an upcoming appointment scheduled 03/30/22.   At the previous visit, we discontinued iron supplement as his levels were normalized. He reports he thinks he is still taking them.    Today's Blood pressure is 175/80. Blood sugar = 356. He did not take his medications today as he was rushing to get out the door. Normally he is compliant with his medications. He does take his sugars at home and reports they normally range from 160's-180's. He does not take blood pressures at home as he does not have a machine. He is interested in obtaining one.   Reports some light headed feelings occasionally in the morning. This is normally alleviated with a meal. He does believe he has lost about 10 pounds in the past 3 months   He is also requesting a referral to opthalmology, reports he hasn't seen them in years. Was previously diagnosed with cataracts   Lastly, he previously was following with urology regarding a left kidney stone. He has not seen them yet and is requesting another referral.   He was able to obtain furniture for his apartment! Although he does report a "nat" issue. Reports some itching of his forearms and neck intermittently. Also notices a rash at the same time in those areas. He believes this may be eczema related.    He is still smoking cigarettes, reports about 3 daily. No alcohol  or recreational drug use.     Patient Active Problem List   Diagnosis Date Noted   Visual changes 03/06/2022   Abnormal ankle brachial index (ABI) 02/14/2022   Iron deficiency 02/14/2022   Encounter for drug screening 02/14/2022   Nephrolithiasis 12/02/2021   Coronary artery disease 04/20/2020   Chronic combined systolic (congestive) and diastolic (congestive) heart failure (HCC)    Essential hypertension    Tobacco abuse 05/14/2019   AKI (acute kidney injury) (HCC)    Type 2 diabetes mellitus (HCC)  03/15/2018   Hyperlipidemia associated with type 2 diabetes mellitus (HCC) 09/08/2013   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   Anemia    Diabetes mellitus    Hypertension    Pancreatitis    Substance use disorder 02/18/2019   Past Surgical History:  Procedure Laterality Date   ERCP  06/27/2011   Procedure: ENDOSCOPIC RETROGRADE CHOLANGIOPANCREATOGRAPHY (ERCP);  Surgeon: Petra Kuba, MD;  Location: [facility];  Service: Endoscopy;  Laterality: N/A;   ERCP W/ PLASTIC STENT PLACEMENT  03/2011   IR US GUIDE BX ASP/DRAIN  05/16/2019   LEFT HEART CATH AND CORONARY ANGIOGRAPHY N/A 01/02/2020   Procedure: LEFT HEART CATH AND CORONARY ANGIOGRAPHY;  Surgeon: Swaziland, Peter M, MD;  Location: [facility] INVASIVE CV LAB;  Service: Cardiovascular;  Laterality: N/A;   TEE WITHOUT CARDIOVERSION N/A 05/20/2019   Procedure: TRANSESOPHAGEAL ECHOCARDIOGRAM (TEE);  Surgeon: Chrystie Nose, MD;  Location: [facility] ENDOSCOPY;  Service: Cardiovascular;  Laterality: N/A;   Social History   Tobacco Use   Smoking status: Every Day    Packs/day: 0.50    Years: 30.00    Total pack years: 15.00    Types: Cigarettes   Smokeless tobacco: Never   Tobacco comments:    6 cigarett /day  Vaping Use   Vaping Use: Never used  Substance Use Topics   Alcohol use: No   Drug use: Not Currently    Types: "Crack" cocaine, Other-see comments, Cocaine    Comment: last smoked crack 12-16-16   Family History  Problem Relation Age of Onset   Diabetes Mother    Diabetes Brother    No Known Allergies    Review of Systems  Constitutional:  Positive for weight loss.       10 lb weight loss 3 months  HENT: Negative.    Eyes: Negative.   Respiratory: Negative.    Cardiovascular:  Positive for claudication.  Gastrointestinal: Negative.   Genitourinary: Negative.   Musculoskeletal:        Left leg pain  Skin:  Positive for itching.       Neck and forearm rash, itchiness   Neurological:        LightHeaded in AM   Endo/Heme/Allergies: Negative.   Psychiatric/Behavioral: Negative.        Objective:     BP (!) 175/80   Pulse (!) 55   Ht 5\' 8"  (1.727 m)   Wt 59.1 kg   SpO2 99%   BMI 19.80 kg/m     Physical Exam HENT:     Head: Normocephalic.     Right Ear: Tympanic membrane, ear canal and external ear normal.     Left Ear: Tympanic membrane, ear canal and external ear normal.     Nose: Nose normal.     Mouth/Throat:     Mouth: Mucous membranes are moist.     Pharynx: Oropharynx is clear.  Eyes:     Conjunctiva/sclera:  Conjunctivae normal.  Cardiovascular:     Rate and Rhythm: Normal rate and regular rhythm.     Pulses: Normal pulses.     Heart sounds: Normal heart sounds.  Pulmonary:     Effort: Pulmonary effort is normal.     Breath sounds: Normal breath sounds.  Abdominal:     General: Bowel sounds are normal.     Palpations: Abdomen is soft.  Skin:    General: Skin is warm.  Neurological:     Mental Status: He is alert and oriented to person, place, and time. Mental status is at baseline.     Comments: Ambulates with cane   Psychiatric:        Mood and Affect: Mood normal.        Behavior: Behavior normal.      Results for orders placed or performed in visit on 03/06/22  POCT glucose (manual entry)  Result Value Ref Range   POC Glucose 356 (A) 70 - 99 mg/dl       The ASCVD Risk score (Arnett DK, et al., 2019) failed to calculate for the following reasons:   The valid total cholesterol range is 130 to 320 mg/dL    Assessment & Plan:   Problem List Items Addressed This Visit       Cardiovascular and Mediastinum   Chronic combined systolic (congestive) and diastolic (congestive) heart failure (HCC)    Compensated heart failure no changes       Essential hypertension    Today's BP 175/80, however patient did not take medications today Needs home blood pressure machine to monitor, ordered today  Continue current  medication regimen Will recheck when patient is compliant with medications at follow up        Endocrine   Hyperlipidemia associated with type 2 diabetes mellitus (HCC)    Continue high dose atorvastatin      Type 2 diabetes mellitus (HCC) - Primary    Type 1.5 diabetes does not have a pancreas its functional continue insulin at current dose levels  Continue blood sugar checks at home  Continue current medication regimen       Relevant Orders   POCT glucose (manual entry) (Completed)   Ambulatory referral to Ophthalmology     Genitourinary   Nephrolithiasis    18 mm calculus in the right renal pelvis, hydronephrosis, and hydrocele  Has previously been evaluated by urology who wanted to proceed with surgery Was unable to schedule with patient Referral sent again, patient instructed to answer phone call       Relevant Orders   Ambulatory referral to Urology     Other   Tobacco abuse    About 3 cigarettes a day Cessation encouraged       Abnormal ankle brachial index (ABI)    Abnormal ABI and referred to Vascular Surgery at previous visit Has upcoming appointment with them 08/24  Patient reminded of this       Visual changes    Per patient, has history of cataracts Referral to opthalmology sent       Relevant Orders   Ambulatory referral to Ophthalmology    Return in about 3 months (around 06/06/2022) for htn, diabetes.    Roxana Hires, Student-PA

## 2022-03-09 ENCOUNTER — Other Ambulatory Visit: Payer: Self-pay

## 2022-03-13 ENCOUNTER — Other Ambulatory Visit: Payer: Self-pay

## 2022-03-14 ENCOUNTER — Other Ambulatory Visit: Payer: Self-pay

## 2022-03-16 ENCOUNTER — Other Ambulatory Visit: Payer: Self-pay | Admitting: *Deleted

## 2022-03-16 ENCOUNTER — Other Ambulatory Visit: Payer: Self-pay

## 2022-03-16 MED ORDER — INSULIN LISPRO (1 UNIT DIAL) 100 UNIT/ML (KWIKPEN)
5.0000 [IU] | PEN_INJECTOR | Freq: Two times a day (BID) | SUBCUTANEOUS | 1 refills | Status: DC
  Filled 2022-03-16: qty 3, 28d supply, fill #0

## 2022-03-16 MED ORDER — VALSARTAN 40 MG PO TABS
40.0000 mg | ORAL_TABLET | Freq: Every day | ORAL | 0 refills | Status: DC
Start: 2022-03-16 — End: 2022-04-20
  Filled 2022-03-16: qty 30, 30d supply, fill #0

## 2022-03-16 MED ORDER — INSULIN GLARGINE 100 UNIT/ML SOLOSTAR PEN
15.0000 [IU] | PEN_INJECTOR | Freq: Every day | SUBCUTANEOUS | 0 refills | Status: DC
  Filled 2022-03-16 – 2022-03-28 (×2): qty 9, 60d supply, fill #0

## 2022-03-17 ENCOUNTER — Other Ambulatory Visit: Payer: Self-pay

## 2022-03-20 ENCOUNTER — Other Ambulatory Visit: Payer: Self-pay

## 2022-03-23 ENCOUNTER — Encounter: Payer: Self-pay | Admitting: *Deleted

## 2022-03-23 NOTE — Congregational Nurse Program (Signed)
Pillbox prepared

## 2022-03-28 ENCOUNTER — Other Ambulatory Visit: Payer: Self-pay

## 2022-03-28 ENCOUNTER — Other Ambulatory Visit: Payer: Self-pay | Admitting: Critical Care Medicine

## 2022-03-29 ENCOUNTER — Other Ambulatory Visit: Payer: Self-pay

## 2022-03-29 MED ORDER — GABAPENTIN 100 MG PO CAPS
100.0000 mg | ORAL_CAPSULE | Freq: Three times a day (TID) | ORAL | 0 refills | Status: DC
Start: 2022-03-29 — End: 2022-03-30
  Filled 2022-03-29: qty 90, 30d supply, fill #0

## 2022-03-30 ENCOUNTER — Encounter: Payer: Self-pay | Admitting: Vascular Surgery

## 2022-03-30 ENCOUNTER — Ambulatory Visit (HOSPITAL_COMMUNITY)
Admission: RE | Admit: 2022-03-30 | Discharge: 2022-03-30 | Disposition: A | Discharge status: Home / Self Care | Payer: Medicaid Other | Source: Ambulatory Visit | Attending: Vascular Surgery | Admitting: Vascular Surgery

## 2022-03-30 ENCOUNTER — Other Ambulatory Visit: Payer: Self-pay | Admitting: *Deleted

## 2022-03-30 ENCOUNTER — Other Ambulatory Visit: Payer: Self-pay

## 2022-03-30 ENCOUNTER — Ambulatory Visit (INDEPENDENT_AMBULATORY_CARE_PROVIDER_SITE_OTHER): Payer: Medicaid Other | Admitting: Vascular Surgery

## 2022-03-30 VITALS — BP 144/69 | HR 53 | Temp 98.0°F | Resp 20 | Ht 68.0 in | Wt 129.0 lb

## 2022-03-30 DIAGNOSIS — I739 Peripheral vascular disease, unspecified: Secondary | ICD-10-CM | POA: Diagnosis not present

## 2022-03-30 DIAGNOSIS — R6889 Other general symptoms and signs: Secondary | ICD-10-CM

## 2022-03-30 MED ORDER — GABAPENTIN 100 MG PO CAPS: 100.0000 mg | ORAL_CAPSULE | Freq: Three times a day (TID) | ORAL | 0 refills | Status: DC

## 2022-03-30 MED ORDER — ATORVASTATIN CALCIUM 80 MG PO TABS
80.0000 mg | ORAL_TABLET | Freq: Every day | ORAL | 6 refills | Status: DC
  Filled 2022-03-30 – 2022-04-06 (×2): qty 30, 30d supply, fill #0

## 2022-03-30 NOTE — Progress Notes (Signed)
ASSESSMENT & PLAN   PERIPHERAL ARTERIAL DISEASE: This patient does have evidence of peripheral arterial disease based on his history and noninvasive studies.  He has claudication of both lower extremities and monophasic Doppler signals in both feet.  However on exam I can palpate a posterior tibial pulse bilaterally and a dorsalis pedis pulse on the right.  Regardless he has stable claudication and no history of rest pain or nonhealing ulcers.  We have discussed the importance of tobacco cessation (3 min).  I have also discussed the importance of walking is much as possible to develop collateral flow.  I have ordered follow-up ABIs in 1 year and I will see him back at that time.  Certainly if the symptoms progress we could consider arteriography.  However currently he has stable claudication and palpable posterior tibial pulses bilaterally.  I explained that if he gets off the cigarettes completely is unlikely that he would require further intervention.  However, if he continues to smoke his disease will likely progress and could potentially become a bigger problem in the future.  REASON FOR CONSULT:    To evaluate for peripheral arterial disease.  The consult is requested by Dr. Asencion Noble.  HPI:   Rodney Barber is a 56 y.o. male who is referred for evaluation of peripheral arterial disease.  On my history he developed the gradual onset of pain in both legs about 3 months ago.  He describes pain in his calfs which is brought on by ambulation and relieved with rest.  He gets some pain in the thighs and hips also when he really pushes him on this.  He denies any history of rest pain or nonhealing ulcers.  His risk factors for peripheral arterial disease include diabetes, hypertension, hypercholesterolemia, a family history of premature cardiovascular disease, and tobacco use.  He tells me that he is currently smoking about 1/2-1/3 a pack per day of cigarettes.  He has been smoking for 30  years.   Past Medical History:  Diagnosis Date   Alcohol-induced chronic pancreatitis ([place]) 07/22/2013   Anemia    Diabetes mellitus    Hypertension    Pancreatitis    Substance use disorder 02/18/2019    Family History  Problem Relation Age of Onset   Diabetes Mother    Diabetes Brother     SOCIAL HISTORY: Social History   Tobacco Use   Smoking status: Every Day    Packs/day: 0.50    Years: 30.00    Total pack years: 15.00    Types: Cigarettes   Smokeless tobacco: Never   Tobacco comments:    6 cigarett /day  Substance Use Topics   Alcohol use: No    No Known Allergies  Current Outpatient Medications  Medication Sig Dispense Refill   Accu-Chek Softclix Lancets lancets Ues to check blood sugar 4 (four) times daily. 100 each 0   acetaminophen (TYLENOL) 325 MG tablet Take 2 tablets (650 mg total) by mouth every 6 (six) hours as needed for mild pain.     aspirin 81 MG chewable tablet HOLD due to Hematuria (Patient taking differently: Chew 81 mg by mouth daily.) 120 tablet 0   atorvastatin (LIPITOR) 80 MG tablet Take 1 tablet (80 mg total) by mouth daily at 6pm 30 tablet 6   Blood Glucose Monitoring Suppl (ACCU-CHEK GUIDE) w/Device KIT Use to check blood sugar four times daily. E11.65 1 kit 0   Blood Pressure Monitoring (BLOOD PRESSURE KIT) DEVI Use to measure blood pressure  1 each 0   carvedilol (COREG) 12.5 MG tablet Take 1 tablet (12.5 mg total) by mouth 2 (two) times daily with a meal. 60 tablet 0   feeding supplement, GLUCERNA SHAKE, (GLUCERNA SHAKE) LIQD Take 237 mLs by mouth 3 (three) times daily between meals.  0   ferrous sulfate (FEROSUL) 325 (65 FE) MG tablet Take 1 tablet (325 mg total) by mouth 2 (two) times daily with a meal. 60 tablet 3   gabapentin (NEURONTIN) 100 MG capsule Take 1 capsule (100 mg total) by mouth 3 (three) times daily. 90 capsule 0   Glucose 4-6 GM-MG CHEW Chew one as needed for blood glucose < 70 (Patient taking differently: Chew 1 tablet  by mouth 2 (two) times daily as needed (low glucose). Chew one as needed for blood glucose < 70) 100 tablet 0   glucose blood (ACCU-CHEK GUIDE) test strip Use to check blood sugar four times daily. 100 each 12   insulin glargine (LANTUS) 100 UNIT/ML Solostar Pen Inject 15 Units into the skin daily. 9 mL 0   insulin lispro (HUMALOG) 100 UNIT/ML KwikPen Inject 5 Units into the skin 2 (two) times daily before a meal. Skip your dose if you do not eat or if your sugar is <150. 3 mL 1   Insulin Pen Needle 31G X 5 MM MISC USE WITH INSULIN PENS 100 each 6   isosorbide mononitrate (IMDUR) 30 MG 24 hr tablet Take 1 tablet (30 mg total) by mouth daily. 90 tablet 3   mometasone-formoterol (DULERA) 100-5 MCG/ACT AERO Inhale 2 puffs into the lungs 2 (two) times daily. Shake well Rinse mouth with water and spit following inhalation. 13 g 0   nicotine (NICODERM CQ - DOSED IN MG/24 HOURS) 14 mg/24hr patch Place 1 patch (14 mg total) onto the skin daily. 28 patch 0   nitroGLYCERIN (NITROSTAT) 0.4 MG SL tablet Place 1 tablet (0.4 mg total) under the tongue every 5 (five) minutes as needed for chest pain. 100 tablet 3   Pancrelipase, Lip-Prot-Amyl, 6000-19000 units CPEP Take 2 capsules (12,000 Units total) by mouth 2 (two) times daily before a meal. Twice a day before breakfast and dinner 240 capsule 0   pantoprazole (PROTONIX) 40 MG tablet Take 1 tablet (40 mg total) by mouth daily. 90 tablet 3   valsartan (DIOVAN) 40 MG tablet Take 1 tablet (40 mg total) by mouth daily. 30 tablet 0   No current facility-administered medications for this visit.    REVIEW OF SYSTEMS:  _0  denotes positive finding, _1  denotes negative finding Cardiac  Comments:  Chest pain or chest pressure:    Shortness of breath upon exertion:    Short of breath when lying flat:    Irregular heart rhythm:        Vascular    Pain in calf, thigh, or hip brought on by ambulation: x   Pain in feet at night that wakes you up from your sleep:      Blood clot in your veins:    Leg swelling:         Pulmonary    Oxygen at home:    Productive cough:     Wheezing:         Neurologic    Sudden weakness in arms or legs:  x   Sudden numbness in arms or legs:     Sudden onset of difficulty speaking or slurred speech:    Temporary loss of vision in one eye:  Problems with dizziness:         Gastrointestinal    Blood in stool:     Vomited blood:         Genitourinary    Burning when urinating:     Blood in urine:        Psychiatric    Major depression:         Hematologic    Bleeding problems:    Problems with blood clotting too easily:        Skin    Rashes or ulcers: x       Constitutional    Fever or chills:    -  PHYSICAL EXAM:   Vitals:   03/30/22 1526  BP: (!) 144/69  Pulse: (!) 53  Resp: 20  Temp: 98 F (36.7 C)  SpO2: 97%  Weight: 129 lb (58.5 kg)  Height: _0  (1.727 m)   Body mass index is 19.61 kg/m. GENERAL: The patient is a well-nourished male, in no acute distress. The vital signs are documented above. CARDIAC: There is a regular rate and rhythm.  VASCULAR: I do not detect carotid bruits. On the right side he has a palpable femoral, popliteal, posterior tibial, and dorsalis pedis pulse.  Dorsalis pedis pulses slightly diminished. On the left side he has a palpable femoral pulse.  I cannot palpate a popliteal pulse but cannot palpate a posterior tibial pulse.  I cannot palpate a dorsalis pedis pulse on the left. He has no significant lower extremity swelling. PULMONARY: There is good air exchange bilaterally without wheezing or rales. ABDOMEN: Soft and non-tender with normal pitched bowel sounds.  I do not palpate an aneurysm. MUSCULOSKELETAL: There are no major deformities. NEUROLOGIC: No focal weakness or paresthesias are detected. SKIN: There are no ulcers or rashes noted. PSYCHIATRIC: The patient has a normal affect.  DATA:    ARTERIAL DOPPLER STUDY: I have independently  interpreted his arterial Doppler study today.  On the right side there is a monophasic dorsalis pedis and posterior tibial signal.  ABI is 92%.  Toe pressures 112 mmHg.  On the left side there is a monophasic dorsalis pedis and posterior tibial signal.  ABIs 82%.  Toe pressures 110 mmHg.  LABS: I reviewed his labs from 02/14/2022.  His creatinine was 1.76.  His GFR was greater than 60 back in May.  Deitra Mayo [facility]

## 2022-04-04 ENCOUNTER — Other Ambulatory Visit: Payer: Self-pay

## 2022-04-06 ENCOUNTER — Other Ambulatory Visit: Payer: Self-pay

## 2022-04-13 ENCOUNTER — Other Ambulatory Visit: Payer: Self-pay

## 2022-04-13 ENCOUNTER — Other Ambulatory Visit: Payer: Self-pay | Admitting: *Deleted

## 2022-04-13 DIAGNOSIS — E1165 Type 2 diabetes mellitus with hyperglycemia: Secondary | ICD-10-CM

## 2022-04-13 MED ORDER — PANTOPRAZOLE SODIUM 40 MG PO TBEC
40.0000 mg | DELAYED_RELEASE_TABLET | Freq: Every day | ORAL | 3 refills | Status: DC
  Filled 2022-04-13: qty 90, 90d supply, fill #0

## 2022-04-13 MED ORDER — ACCU-CHEK GUIDE VI STRP
ORAL_STRIP | 12 refills | Status: DC
  Filled 2022-04-13: qty 100, 25d supply, fill #0

## 2022-04-13 MED ORDER — PANCRELIPASE (LIP-PROT-AMYL) 6000-19000 UNITS PO CPEP
2.0000 | ORAL_CAPSULE | Freq: Two times a day (BID) | ORAL | 0 refills | Status: DC
  Filled 2022-04-13: qty 120, 30d supply, fill #0

## 2022-04-13 MED ORDER — INSULIN PEN NEEDLE 31G X 5 MM MISC
6 refills | Status: DC
  Filled 2022-04-13: qty 100, 25d supply, fill #0

## 2022-04-13 MED ORDER — GABAPENTIN 100 MG PO CAPS
100.0000 mg | ORAL_CAPSULE | Freq: Three times a day (TID) | ORAL | 0 refills | Status: DC
  Filled 2022-04-13 – 2022-04-14 (×2): qty 90, 30d supply, fill #0

## 2022-04-13 MED ORDER — CARVEDILOL 12.5 MG PO TABS
12.5000 mg | ORAL_TABLET | Freq: Two times a day (BID) | ORAL | 0 refills | Status: DC
  Filled 2022-04-13: qty 60, 30d supply, fill #0

## 2022-04-13 MED ORDER — ASPIRIN 81 MG PO CHEW: 81.0000 mg | CHEWABLE_TABLET | Freq: Every day | ORAL | Status: AC

## 2022-04-14 ENCOUNTER — Other Ambulatory Visit: Payer: Self-pay

## 2022-04-20 ENCOUNTER — Encounter: Payer: Self-pay | Admitting: *Deleted

## 2022-04-20 ENCOUNTER — Other Ambulatory Visit: Payer: Self-pay | Admitting: *Deleted

## 2022-04-20 ENCOUNTER — Other Ambulatory Visit: Payer: Self-pay

## 2022-04-20 MED ORDER — VALSARTAN 40 MG PO TABS
40.0000 mg | ORAL_TABLET | Freq: Every day | ORAL | 0 refills | Status: DC
Start: 2022-04-20 — End: 2022-04-27
  Filled 2022-04-20: qty 30, 30d supply, fill #0

## 2022-04-20 NOTE — Congregational Nurse Program (Signed)
Pillbox made

## 2022-04-27 ENCOUNTER — Other Ambulatory Visit: Payer: Self-pay

## 2022-04-27 ENCOUNTER — Other Ambulatory Visit: Payer: Self-pay | Admitting: *Deleted

## 2022-04-27 ENCOUNTER — Encounter: Payer: Self-pay | Admitting: *Deleted

## 2022-04-27 MED ORDER — INSULIN LISPRO (1 UNIT DIAL) 100 UNIT/ML (KWIKPEN)
5.0000 [IU] | PEN_INJECTOR | Freq: Two times a day (BID) | SUBCUTANEOUS | 1 refills | Status: DC
  Filled 2022-04-27: qty 3, 30d supply, fill #0

## 2022-04-27 MED ORDER — VALSARTAN 40 MG PO TABS
40.0000 mg | ORAL_TABLET | Freq: Every day | ORAL | 0 refills | Status: DC
  Filled 2022-04-27: qty 30, 30d supply, fill #0

## 2022-04-27 MED ORDER — INSULIN GLARGINE 100 UNIT/ML SOLOSTAR PEN
15.0000 [IU] | PEN_INJECTOR | Freq: Every day | SUBCUTANEOUS | 0 refills | Status: DC
  Filled 2022-04-27: qty 9, 60d supply, fill #0

## 2022-04-27 MED ORDER — ATORVASTATIN CALCIUM 80 MG PO TABS
80.0000 mg | ORAL_TABLET | Freq: Every day | ORAL | 6 refills | Status: DC
  Filled 2022-04-27: qty 30, 30d supply, fill #0

## 2022-04-27 MED ORDER — FERROUS SULFATE 325 (65 FE) MG PO TABS
325.0000 mg | ORAL_TABLET | Freq: Two times a day (BID) | ORAL | 3 refills | Status: DC
  Filled 2022-04-27: qty 60, 30d supply, fill #0

## 2022-04-27 NOTE — Congregational Nurse Program (Signed)
Pill box filled Personal BP monitor given from G[facility] Dr Joya Gaskins dropped in and spoke w/ pt

## 2022-04-29 IMAGING — CT CT RENAL STONE PROTOCOL
2 of 4 series · 16 of 46 positions shown, 18 images · non-contrast
Comparison: CT abdomen and pelvis 10/06/2020.

CLINICAL DATA: Left flank pain.

EXAM:
CT ABDOMEN AND PELVIS WITHOUT CONTRAST
TECHNIQUE: Multidetector CT imaging of the abdomen and pelvis was performed
following the standard protocol without IV contrast.

[Series 2: axial st · axial · 0.80mm/px · z∈[-413,-38]mm · 13 of 87 slices shown, 15 images]
[im 6/87  soft-tissue]
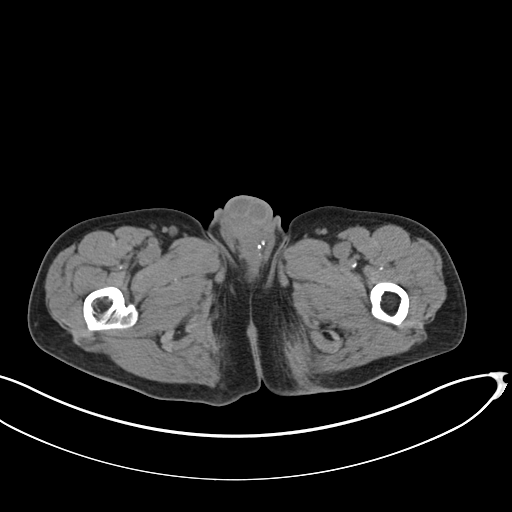
[im 6/87  bone]
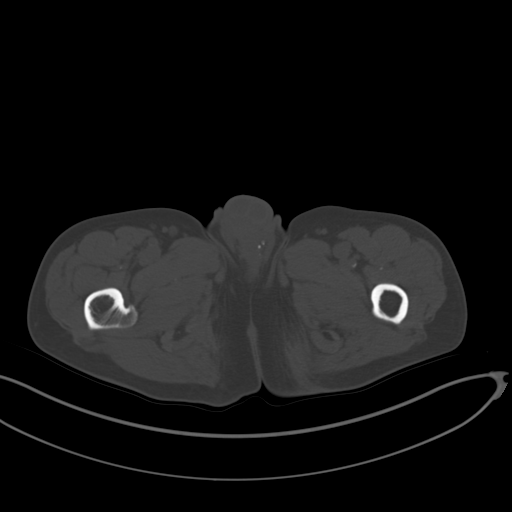
[im 11/87  soft-tissue]
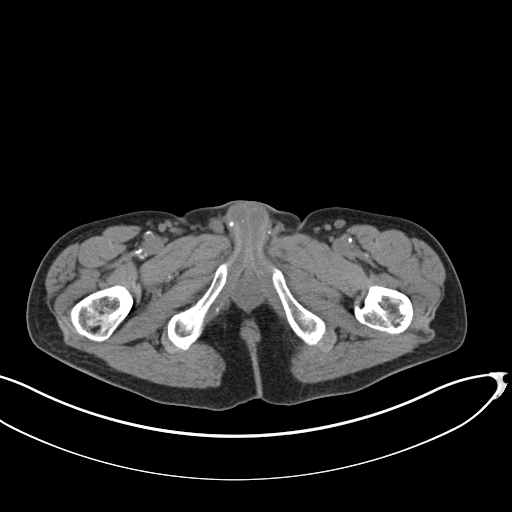
[im 21/87  soft-tissue]
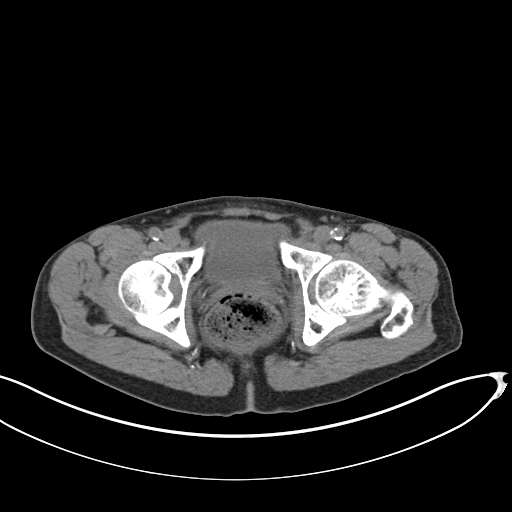
[im 26/87  soft-tissue]
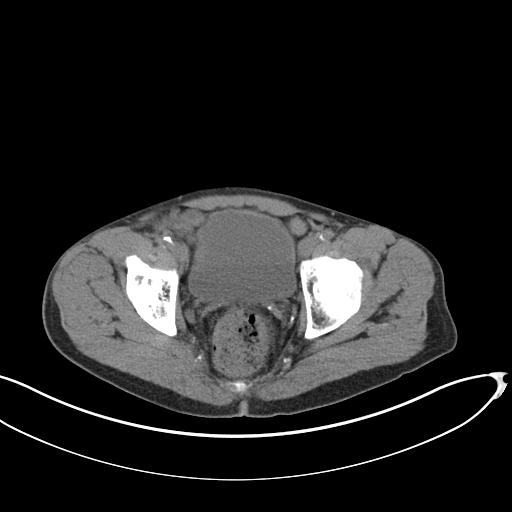
[im 31/87  soft-tissue]
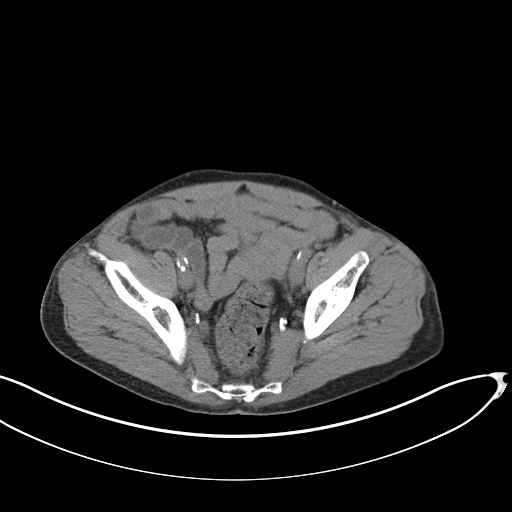
[im 36/87  soft-tissue]
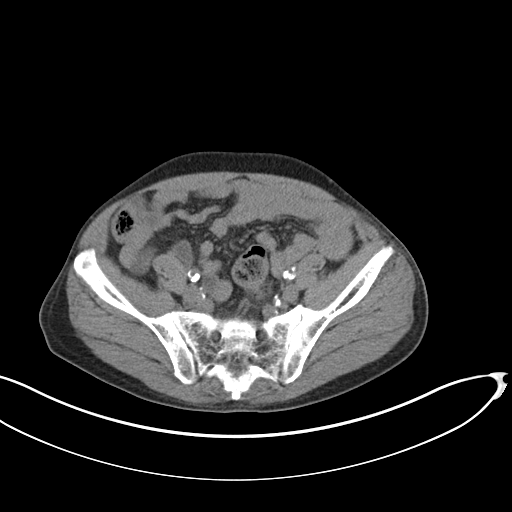
[im 46/87  soft-tissue]
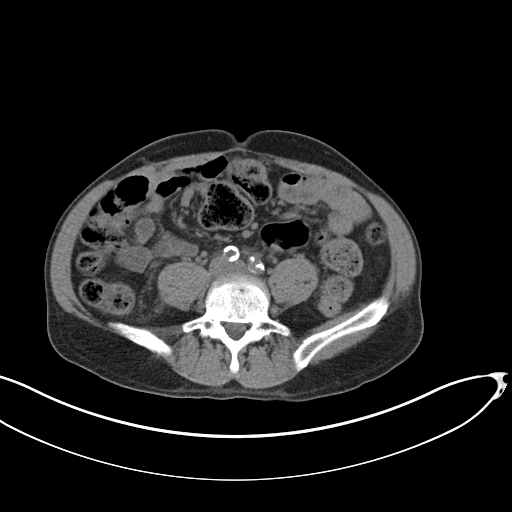
[im 51/87  soft-tissue]
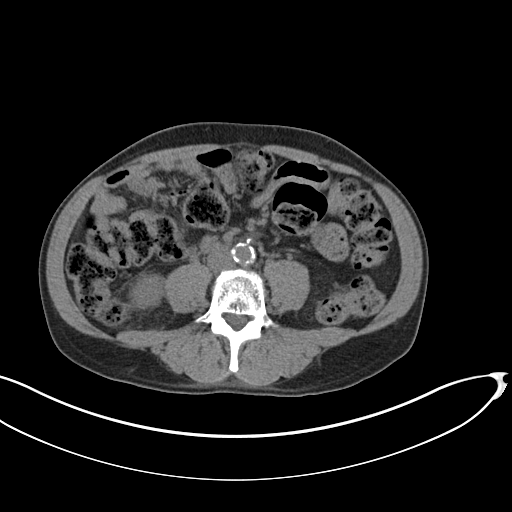
[im 56/87  soft-tissue]
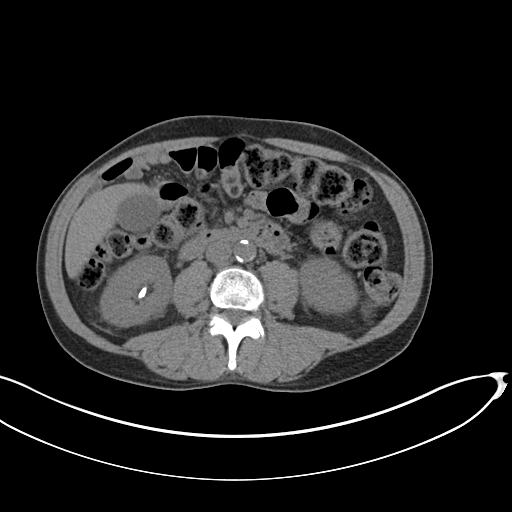
[im 56/87  bone]
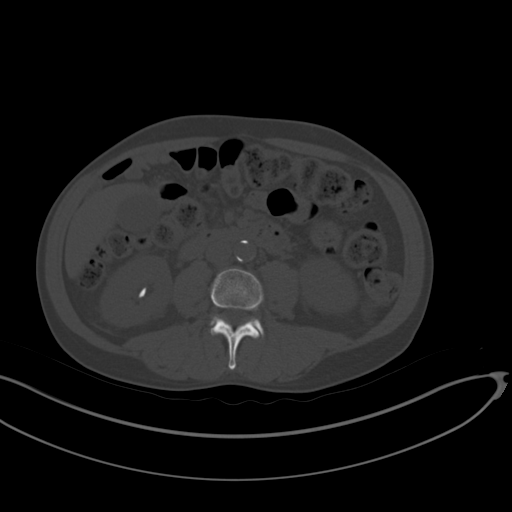
[im 61/87  soft-tissue]
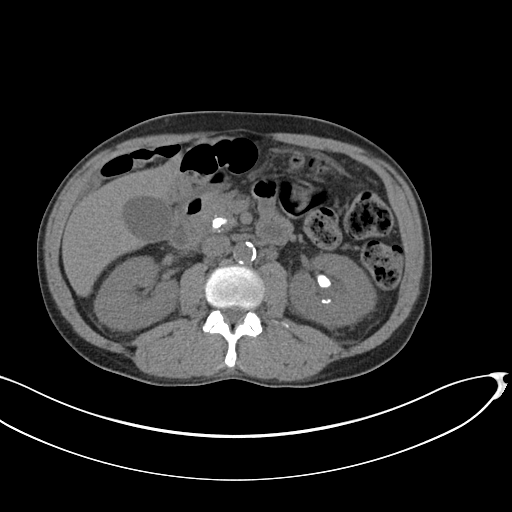
[im 66/87  soft-tissue]
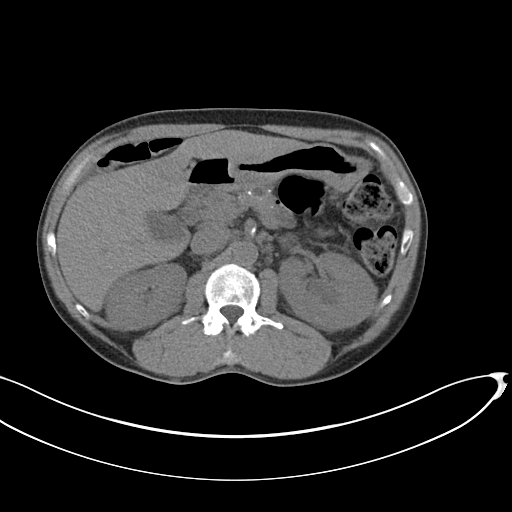
[im 76/87  soft-tissue]
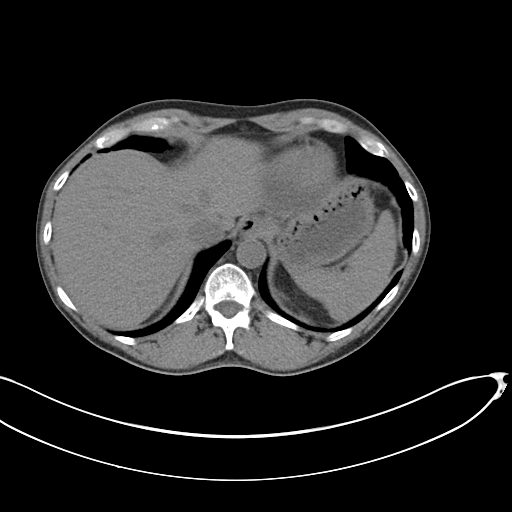
[im 81/87  soft-tissue]
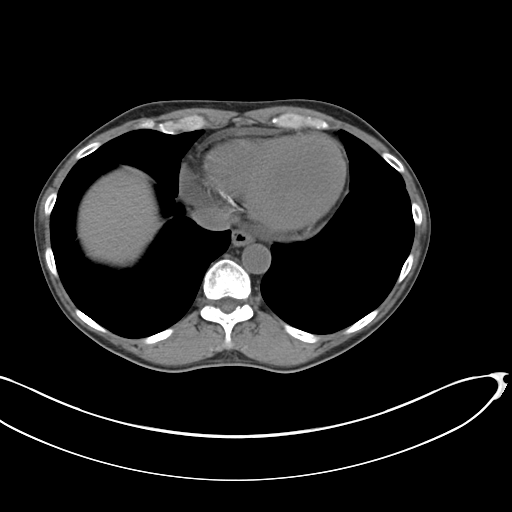

[Series 4: coronal · coronal · 0.73mm/px · 3 of 126 slices shown]
[im 42/126  soft-tissue]
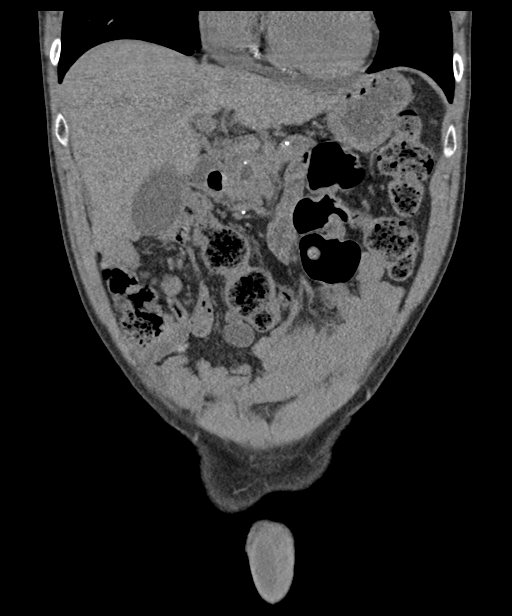
[im 56/126  soft-tissue]
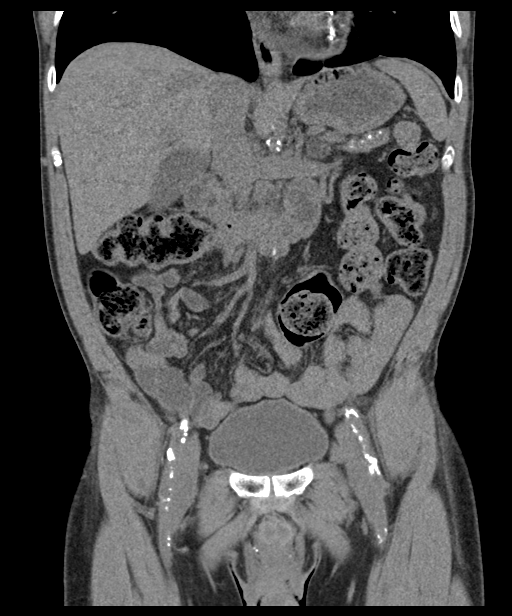
[im 70/126  soft-tissue]
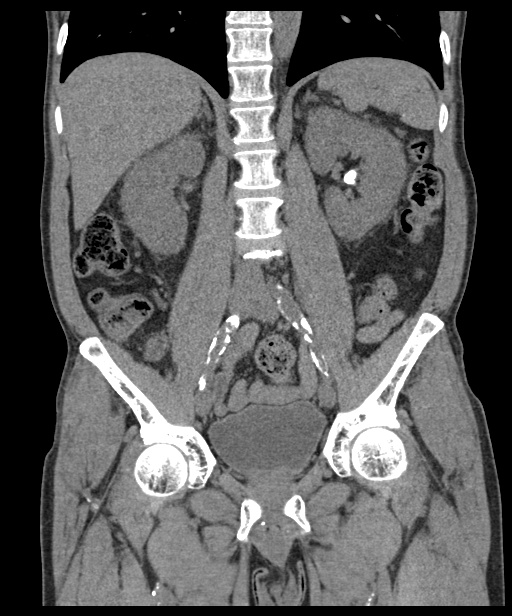

[16 of 46 positions shown; findings below may reference images not displayed]

FINDINGS: Lower chest: No acute abnormality.

Hepatobiliary: No focal liver abnormality is seen. No gallstones,
gallbladder wall thickening, small gallstones are present. There is
no biliary ductal dilatation. No focal liver lesions are identified.

Pancreas: Pancreatic calcifications, atrophy and ductal dilatation
appear unchanged from the prior examination.

Spleen: Normal in size without focal abnormality.

Adrenals/Urinary Tract: Left adrenal adenoma is unchanged. Right
adrenal gland is within normal limits. There is an 18 mm calculus in
the left renal pelvis similar to the prior study. There is stable
mild left-sided hydronephrosis. There are additional smaller
bilateral renal calculi also similar to the prior study. Bilateral
ureters are within normal limits as well as the bladder. There is
mild bilateral perinephric fat stranding. This is similar to the
prior study. Right adrenal gland is within normal limits.

Stomach/Bowel: Stomach is within normal limits. Appendix appears
normal. No evidence of bowel wall thickening, distention, or
inflammatory changes. There is a large amount of stool throughout
the colon.

Vascular/Lymphatic: Aortic atherosclerosis. No enlarged abdominal or
pelvic lymph nodes.

Reproductive: Prostate is unremarkable.

Other: No abdominal wall hernia or abnormality. No abdominopelvic
ascites.

Musculoskeletal: No acute or significant osseous findings.
IMPRESSION: 1. Unchanged 18 mm calculus in the left renal pelvis. There is
stable mild left-sided hydronephrosis.
2. Mild bilateral perinephric fat stranding is unchanged and can be
seen in the setting of medical renal disease or infection.
3. Additional nonobstructing bilateral renal calculi.
4. Large stool burden.  No bowel obstruction.
5. Cholelithiasis.
6. Stable chronic changes in the pancreas.
7.  Aortic Atherosclerosis ([id number]).

## 2022-05-01 ENCOUNTER — Other Ambulatory Visit: Payer: Self-pay

## 2022-05-01 ENCOUNTER — Other Ambulatory Visit: Payer: Self-pay | Admitting: Pharmacist

## 2022-05-01 ENCOUNTER — Telehealth: Payer: Self-pay | Admitting: Critical Care Medicine

## 2022-05-01 MED ORDER — INSULIN GLARGINE 100 UNIT/ML SOLOSTAR PEN
25.0000 [IU] | PEN_INJECTOR | Freq: Every day | SUBCUTANEOUS | 2 refills | Status: DC
  Filled 2022-05-01 – 2022-05-10 (×2): qty 15, 60d supply, fill #0

## 2022-05-01 NOTE — Telephone Encounter (Signed)
Medication Refill - Medication:  insulin glargine (LANTUS) 100 UNIT/ML Solostar Pen  Has the patient contacted their pharmacy? No.  Preferred Pharmacy (with phone number or street name):  [place] Phone:  [phone]  Fax:  [phone]     Has the patient been seen for an appointment in the last year OR does the patient have an upcoming appointment? Yes.     Pt states the wrong rx was sent to the pharmacy  Pt requesting Lantus instead of Humalog  Pt requesting a cb

## 2022-05-01 NOTE — Telephone Encounter (Signed)
[place] called and spoke to [place], [employer] about the refill(s) Lantus pen requested. Advised it was sent on 04/27/22 9 ml/0 refill(s). She says on 03/28/22 patient picked up a 60 day supply, so he should have more left for 30 days. He says he doesn't have enough left. I asked what is the dosage he takes, he says 25 units daily. I advised on the Rx in the chart it says 15 units daily. He says Dr. Joya Gaskins told him to take 25 units daily the last time he saw him. I advised I will send this to Dr. Joya Gaskins because I don't see any documentation, advised a new Rx will be sent if Dr. Joya Gaskins wants him to take 25 units.

## 2022-05-04 ENCOUNTER — Encounter: Payer: Self-pay | Admitting: *Deleted

## 2022-05-04 ENCOUNTER — Other Ambulatory Visit: Payer: Self-pay

## 2022-05-04 ENCOUNTER — Other Ambulatory Visit: Payer: Self-pay | Admitting: *Deleted

## 2022-05-04 MED ORDER — ATORVASTATIN CALCIUM 80 MG PO TABS
80.0000 mg | ORAL_TABLET | Freq: Every day | ORAL | 6 refills | Status: DC
  Filled 2022-05-04: qty 30, 30d supply, fill #0

## 2022-05-04 NOTE — Congregational Nurse Program (Signed)
Pillbox filled

## 2022-05-05 ENCOUNTER — Other Ambulatory Visit: Payer: Self-pay

## 2022-05-05 ENCOUNTER — Emergency Department (HOSPITAL_COMMUNITY)
Admission: EM | Admit: 2022-05-05 | Discharge: 2022-05-05 | Disposition: A | Discharge status: Home / Self Care | Payer: Medicaid Other | Attending: Emergency Medicine | Admitting: Emergency Medicine

## 2022-05-05 ENCOUNTER — Encounter (HOSPITAL_COMMUNITY): Payer: Self-pay | Admitting: Emergency Medicine

## 2022-05-05 DIAGNOSIS — Z7982 Long term (current) use of aspirin: Secondary | ICD-10-CM | POA: Diagnosis not present

## 2022-05-05 DIAGNOSIS — F1410 Cocaine abuse, uncomplicated: Secondary | ICD-10-CM

## 2022-05-05 DIAGNOSIS — H1032 Unspecified acute conjunctivitis, left eye: Secondary | ICD-10-CM | POA: Diagnosis not present

## 2022-05-05 DIAGNOSIS — Z79899 Other long term (current) drug therapy: Secondary | ICD-10-CM | POA: Insufficient documentation

## 2022-05-05 DIAGNOSIS — F1210 Cannabis abuse, uncomplicated: Secondary | ICD-10-CM | POA: Diagnosis not present

## 2022-05-05 DIAGNOSIS — I2510 Atherosclerotic heart disease of native coronary artery without angina pectoris: Secondary | ICD-10-CM | POA: Diagnosis not present

## 2022-05-05 DIAGNOSIS — H5712 Ocular pain, left eye: Secondary | ICD-10-CM | POA: Diagnosis present

## 2022-05-05 DIAGNOSIS — I10 Essential (primary) hypertension: Secondary | ICD-10-CM | POA: Insufficient documentation

## 2022-05-05 DIAGNOSIS — Z794 Long term (current) use of insulin: Secondary | ICD-10-CM | POA: Insufficient documentation

## 2022-05-05 DIAGNOSIS — L03213 Periorbital cellulitis: Secondary | ICD-10-CM | POA: Insufficient documentation

## 2022-05-05 DIAGNOSIS — I160 Hypertensive urgency: Secondary | ICD-10-CM | POA: Insufficient documentation

## 2022-05-05 DIAGNOSIS — E119 Type 2 diabetes mellitus without complications: Secondary | ICD-10-CM | POA: Diagnosis not present

## 2022-05-05 LAB — URINALYSIS, ROUTINE W REFLEX MICROSCOPIC
Bilirubin Urine: NEGATIVE
Glucose, UA: 150 mg/dL — AB
Ketones, ur: NEGATIVE mg/dL
Leukocytes,Ua: NEGATIVE
Nitrite: NEGATIVE
Protein, ur: >=300 mg/dL — AB
Specific Gravity, Urine: 1.015 (ref 1.005–1.030)
pH: 5 (ref 5.0–8.0)

## 2022-05-05 LAB — CBC WITH DIFFERENTIAL/PLATELET
Abs Immature Granulocytes: 0.04 10*3/uL (ref 0.00–0.07)
Basophils Absolute: 0.1 10*3/uL (ref 0.0–0.1)
Basophils Relative: 1 %
Eosinophils Absolute: 0.1 10*3/uL (ref 0.0–0.5)
Eosinophils Relative: 1 %
HCT: 31.1 % — ABNORMAL LOW (ref 39.0–52.0)
Hemoglobin: 9.8 g/dL — ABNORMAL LOW (ref 13.0–17.0)
Immature Granulocytes: 0 %
Lymphocytes Relative: 13 %
Lymphs Abs: 1.2 10*3/uL (ref 0.7–4.0)
MCH: 29.3 pg (ref 26.0–34.0)
MCHC: 31.5 g/dL (ref 30.0–36.0)
MCV: 92.8 fL (ref 80.0–100.0)
Monocytes Absolute: 0.6 10*3/uL (ref 0.1–1.0)
Monocytes Relative: 6 %
Neutro Abs: 7.7 10*3/uL (ref 1.7–7.7)
Neutrophils Relative %: 79 %
Platelets: 139 10*3/uL — ABNORMAL LOW (ref 150–400)
RBC: 3.35 MIL/uL — ABNORMAL LOW (ref 4.22–5.81)
RDW: 12.9 % (ref 11.5–15.5)
WBC: 9.8 10*3/uL (ref 4.0–10.5)
nRBC: 0 % (ref 0.0–0.2)

## 2022-05-05 LAB — RAPID URINE DRUG SCREEN, HOSP PERFORMED
Amphetamines: NOT DETECTED
Barbiturates: NOT DETECTED
Benzodiazepines: NOT DETECTED
Cocaine: POSITIVE — AB
Opiates: NOT DETECTED
Tetrahydrocannabinol: NOT DETECTED

## 2022-05-05 LAB — BASIC METABOLIC PANEL
Anion gap: 5 (ref 5–15)
BUN: 15 mg/dL (ref 6–20)
CO2: 27 mmol/L (ref 22–32)
Calcium: 8.7 mg/dL — ABNORMAL LOW (ref 8.9–10.3)
Chloride: 107 mmol/L (ref 98–111)
Creatinine, Ser: 1.4 mg/dL — ABNORMAL HIGH (ref 0.61–1.24)
GFR, Estimated: 59 mL/min — ABNORMAL LOW (ref >60)
Glucose, Bld: 261 mg/dL — ABNORMAL HIGH (ref 70–99)
Potassium: 2.5 mmol/L — CL (ref 3.5–5.1)
Sodium: 139 mmol/L (ref 135–145)

## 2022-05-05 LAB — MAGNESIUM: Magnesium: 1.8 mg/dL (ref 1.7–2.4)

## 2022-05-05 MED ORDER — ISOSORBIDE MONONITRATE ER 30 MG PO TB24
30.0000 mg | ORAL_TABLET | Freq: Every day | ORAL | Status: DC
  Administered 2022-05-05: 30 mg via ORAL
  Filled 2022-05-05: qty 1

## 2022-05-05 MED ORDER — FLUORESCEIN SODIUM 1 MG OP STRP
1.0000 | ORAL_STRIP | Freq: Once | OPHTHALMIC | Status: AC
  Administered 2022-05-05: 1 via OPHTHALMIC
  Filled 2022-05-05: qty 1

## 2022-05-05 MED ORDER — ERYTHROMYCIN 5 MG/GM OP OINT
1.0000 | TOPICAL_OINTMENT | Freq: Once | OPHTHALMIC | Status: AC
  Administered 2022-05-05: 1 via OPHTHALMIC
  Filled 2022-05-05: qty 3.5

## 2022-05-05 MED ORDER — CARVEDILOL 12.5 MG PO TABS: 12.5000 mg | ORAL_TABLET | Freq: Two times a day (BID) | ORAL | Status: DC

## 2022-05-05 MED ORDER — IRBESARTAN 75 MG PO TABS
37.5000 mg | ORAL_TABLET | Freq: Every day | ORAL | Status: DC
  Filled 2022-05-05: qty 0.5

## 2022-05-05 MED ORDER — POTASSIUM CHLORIDE CRYS ER 20 MEQ PO TBCR
20.0000 meq | EXTENDED_RELEASE_TABLET | Freq: Two times a day (BID) | ORAL | 0 refills | Status: DC
  Filled 2022-05-05: qty 10, 5d supply, fill #0

## 2022-05-05 MED ORDER — AMOXICILLIN-POT CLAVULANATE 875-125 MG PO TABS
1.0000 | ORAL_TABLET | Freq: Two times a day (BID) | ORAL | 0 refills | Status: DC
  Filled 2022-05-05: qty 14, 7d supply, fill #0

## 2022-05-05 MED ORDER — ERYTHROMYCIN 5 MG/GM OP OINT
TOPICAL_OINTMENT | OPHTHALMIC | 0 refills | Status: DC
  Filled 2022-05-05: qty 3.5, 15d supply, fill #0

## 2022-05-05 MED ORDER — TETRACAINE HCL 0.5 % OP SOLN
2.0000 [drp] | Freq: Once | OPHTHALMIC | Status: AC
  Administered 2022-05-05: 2 [drp] via OPHTHALMIC
  Filled 2022-05-05: qty 4

## 2022-05-05 MED ORDER — POTASSIUM CHLORIDE CRYS ER 20 MEQ PO TBCR
40.0000 meq | EXTENDED_RELEASE_TABLET | Freq: Once | ORAL | Status: AC
  Administered 2022-05-05: 40 meq via ORAL
  Filled 2022-05-05: qty 2

## 2022-05-05 NOTE — ED Notes (Signed)
Called in lobby to give medication no answer

## 2022-05-05 NOTE — ED Provider Notes (Signed)
[facility] EMERGENCY DEPARTMENT Provider Note   CSN: [id number] Arrival date & time: 05/05/22  0050     History  Chief Complaint  Patient presents with   Conjunctivitis    Rodney Barber is a 56 y.o. male.  HPI    56 year old male comes in with chief complaint of conjunctivitis  Patient has history of diabetes, hyperlipidemia, hypertension, CAD and substance use disorder.  He indicates that he started having discomfort in his left eye last night.  He states that he lives in a house where lots of insects crawl out at nighttime.  He is afraid that perhaps one of the bugs might have crawled into his eye.  He is having increased tearing from his left eye.  He denies any eye pain.  No vision change.  Patient's blood pressure is elevated.  He admits to crack use yesterday and also admits that he did not take his antihypertensive medication today.  No chest pain, shortness of breath, headaches, focal weakness or numbness.  Home Medications Prior to Admission medications   Medication Sig Start Date End Date Taking? Authorizing Provider  amoxicillin-clavulanate (AUGMENTIN) 875-125 MG tablet Take 1 tablet by mouth every 12 (twelve) hours. 05/05/22  Yes Varney Biles, MD  erythromycin ophthalmic ointment Place a 1/2 inch ribbon of ointment into the lower eyelid. 05/05/22  Yes Varney Biles, MD  Accu-Chek Softclix Lancets lancets Ues to check blood sugar 4 (four) times daily. 09/14/21   Elsie Stain, MD  acetaminophen (TYLENOL) 325 MG tablet Take 2 tablets (650 mg total) by mouth every 6 (six) hours as needed for mild pain. 12/06/21   Lattie Haw, MD  aspirin 81 MG chewable tablet Chew 1 tablet (81 mg total) by mouth daily. 04/13/22   Elsie Stain, MD  atorvastatin (LIPITOR) 80 MG tablet Take 1 tablet (80 mg total) by mouth daily at 6pm 05/04/22   Elsie Stain, MD  Blood Glucose Monitoring Suppl (ACCU-CHEK GUIDE) w/Device KIT Use to check blood sugar four  times daily. E11.65 09/14/21   Elsie Stain, MD  Blood Pressure Monitoring (BLOOD PRESSURE KIT) DEVI Use to measure blood pressure 03/06/22   Elsie Stain, MD  carvedilol (COREG) 12.5 MG tablet Take 1 tablet (12.5 mg total) by mouth 2 (two) times daily with a meal. 04/13/22 05/14/22  Elsie Stain, MD  feeding supplement, GLUCERNA SHAKE, (GLUCERNA SHAKE) LIQD Take 237 mLs by mouth 3 (three) times daily between meals. 09/11/19   Mariel Aloe, MD  ferrous sulfate (FEROSUL) 325 (65 FE) MG tablet Take 1 tablet (325 mg total) by mouth 2 (two) times daily with a meal. 04/27/22   Elsie Stain, MD  gabapentin (NEURONTIN) 100 MG capsule Take 1 capsule (100 mg total) by mouth 3 (three) times daily. 04/13/22   Elsie Stain, MD  Glucose 4-6 GM-MG CHEW Chew one as needed for blood glucose < 70 Patient taking differently: Chew 1 tablet by mouth 2 (two) times daily as needed (low glucose). Chew one as needed for blood glucose < 70 05/06/20   Elsie Stain, MD  glucose blood (ACCU-CHEK GUIDE) test strip Use to check blood sugar four times daily. 04/13/22   Elsie Stain, MD  insulin glargine (LANTUS) 100 UNIT/ML Solostar Pen Inject 25 Units into the skin daily. 05/01/22   Elsie Stain, MD  insulin lispro (HUMALOG) 100 UNIT/ML KwikPen Inject 5 Units into the skin 2 (two) times daily before a meal. Skip your dose  if you do not eat or if your sugar is <150. 04/27/22 08/25/22  Elsie Stain, MD  Insulin Pen Needle 31G X 5 MM MISC USE WITH INSULIN PENS 04/13/22   Elsie Stain, MD  isosorbide mononitrate (IMDUR) 30 MG 24 hr tablet Take 1 tablet (30 mg total) by mouth daily. 02/09/22   Elsie Stain, MD  mometasone-formoterol (DULERA) 100-5 MCG/ACT AERO Inhale 2 puffs into the lungs 2 (two) times daily. Shake well Rinse mouth with water and spit following inhalation. 12/06/21   Zenia Resides, MD  nicotine (NICODERM CQ - DOSED IN MG/24 HOURS) 14 mg/24hr patch Place 1 patch (14 mg total) onto  the skin daily. 12/07/21   Lattie Haw, MD  nitroGLYCERIN (NITROSTAT) 0.4 MG SL tablet Place 1 tablet (0.4 mg total) under the tongue every 5 (five) minutes as needed for chest pain. 12/15/20   Elsie Stain, MD  Pancrelipase, Lip-Prot-Amyl, 6000-19000 units CPEP Take 2 capsules (12,000 Units total) by mouth 2 (two) times daily before a meal. Twice a day before breakfast and dinner 04/13/22 06/12/22  Elsie Stain, MD  pantoprazole (PROTONIX) 40 MG tablet Take 1 tablet (40 mg total) by mouth daily. 04/13/22   Elsie Stain, MD  valsartan (DIOVAN) 40 MG tablet Take 1 tablet (40 mg total) by mouth daily. 04/27/22 05/31/22  Elsie Stain, MD  Ferrous Sulfate Dried 200 (65 Fe) MG TABS TAKE 200 MG BY MOUTH 2 (TWO) TIMES DAILY WITH A MEAL. 02/02/21 04/20/21  Elsie Stain, MD      Allergies    Patient has no known allergies.    Review of Systems   Review of Systems  All other systems reviewed and are negative.   Physical Exam Updated Vital Signs BP (!) 204/81   Pulse 68   Temp 97.6 F (36.4 C)   Resp 16   Ht 5' 8" (1.727 m)   Wt 59.9 kg   SpO2 99%   BMI 20.07 kg/m  Physical Exam Vitals and nursing note reviewed.  Constitutional:      Appearance: He is well-developed.  HENT:     Head: Atraumatic.  Eyes:     Extraocular Movements: Extraocular movements intact.     Pupils: Pupils are equal, round, and reactive to light.     Comments: Peripheral visual fields intact, no nystagmus.  Ocular exam reveals no photophobia -direct or consensual. No pain with movement of the eye Patient has chemosis over his conjunctiva. Woods lamp exam revealed slight dye uptake only in the 5 o'clock position of the cornea.   Cardiovascular:     Rate and Rhythm: Normal rate.  Pulmonary:     Effort: Pulmonary effort is normal.  Musculoskeletal:     Cervical back: Neck supple.  Skin:    General: Skin is warm.     Comments: Patient has mild.  Orbital erythema and there is a scab in the  infraorbital region of the left eye. no drainage, no fluctuance  Neurological:     Mental Status: He is alert and oriented to person, place, and time.     ED Results / Procedures / Treatments   Labs (all labs ordered are listed, but only abnormal results are displayed) Labs Reviewed  CBC WITH DIFFERENTIAL/PLATELET - Abnormal; Notable for the following components:      Result Value   RBC 3.35 (*)    Hemoglobin 9.8 (*)    HCT 31.1 (*)    Platelets 139 (*)  All other components within normal limits  BASIC METABOLIC PANEL - Abnormal; Notable for the following components:   Potassium 2.5 (*)    Glucose, Bld 261 (*)    Creatinine, Ser 1.40 (*)    Calcium 8.7 (*)    GFR, Estimated 59 (*)    All other components within normal limits  URINALYSIS, ROUTINE W REFLEX MICROSCOPIC - Abnormal; Notable for the following components:   Glucose, UA 150 (*)    Hgb urine dipstick MODERATE (*)    Protein, ur >=300 (*)    Bacteria, UA RARE (*)    All other components within normal limits  RAPID URINE DRUG SCREEN, HOSP PERFORMED - Abnormal; Notable for the following components:   Cocaine POSITIVE (*)    All other components within normal limits  MAGNESIUM  CBG MONITORING, ED    EKG None  Radiology No results found.  Procedures Procedures    Medications Ordered in ED Medications  irbesartan (AVAPRO) tablet 37.5 mg (37.5 mg Oral Not Given 05/05/22 1818)  isosorbide mononitrate (IMDUR) 24 hr tablet 30 mg (30 mg Oral Given 05/05/22 1817)  carvedilol (COREG) tablet 12.5 mg (has no administration in time range)  potassium chloride SA (KLOR-CON M) CR tablet 40 mEq (40 mEq Oral Given 05/05/22 0908)  fluorescein ophthalmic strip 1 strip (1 strip Left Eye Given 05/05/22 1818)  tetracaine (PONTOCAINE) 0.5 % ophthalmic solution 2 drop (2 drops Left Eye Given 05/05/22 1844)  erythromycin ophthalmic ointment 1 Application (1 Application Left Eye Given 05/05/22 1909)    ED Course/ Medical Decision  Making/ A&P                           Medical Decision Making Risk Prescription drug management.   This patient presents to the ED with chief complaint(s) of eye drainage and discomfort for the last day.  He is also noted to be hypertensive with pertinent past medical history of diabetes, tobacco and cocaine use disorder, hypertension and hyperlipidemia.The complaint involves an extensive differential diagnosis and also carries with it a high risk of complications and morbidity.    The differential diagnosis includes : For eye symptoms, corneal abrasion, conjunctivitis, preorbital cellulitis. Patient's gross vision exam is normal, no photophobia, no pain with eye movement is all reassuring. He is diabetic, has peripheral vascular disease and uses cocaine.  I think it will be best to put him on Augmentin in case this is early preseptal cellulitis, especially given that I did notice some scabbing in the infraorbital region.  Differential diagnosis for blood pressure includes hypertensive urgency primarily. Labs are reassuring.  EKG shows no changes.  He did not take his morning meds, we will provide that here.  Patient also found to have hypokalemia.  I have discharged him with potassium pills for the next 7 days.  Additional history obtained: Records reviewed Primary Care Documents  Independent labs interpretation:  The following labs were independently interpreted: Potassium is 2.5.  No profound leukocytosis.  Creatinine at baseline normal.   Treatment and Reassessment: Patient given erythromycin ointment and Augmentin here.  Ophthalmology follow-up instructions provided.  Strict ER return precautions discussed.  Final Clinical Impression(s) / ED Diagnoses Final diagnoses:  Acute conjunctivitis of left eye, unspecified acute conjunctivitis type  Periorbital cellulitis of left eye  Hypertensive urgency  Cocaine abuse ([place])    Rx / DC Orders ED Discharge Orders          Ordered  erythromycin ophthalmic ointment        05/05/22 1905    amoxicillin-clavulanate (AUGMENTIN) 875-125 MG tablet  Every 12 hours        05/05/22 1905              Varney Biles, MD 05/05/22 [phone]

## 2022-05-05 NOTE — ED Notes (Signed)
PA at triage aware of patient's hypokalemia , will move to bed as soon as available .

## 2022-05-05 NOTE — Discharge Instructions (Addendum)
We signed the ER for eye discomfort.  We suspect that you likely have infection around your eye and conjunctivitis.  The antibiotic ointment and the antibiotic by mouth should help you with it.  Please call the ophthalmologist for a close follow-up. Return to the ER if you start having vision loss, eye pain.

## 2022-05-05 NOTE — ED Notes (Signed)
Pt states that "little bugs like nats" got into his eyes yesterday and have been bothering him since.  He states that he has bilateral eye pain 5/10.  He reports "feels like I have gunk in my eyes."

## 2022-05-05 NOTE — ED Triage Notes (Addendum)
Patient arrived with EMS from home reports chronic bilateral eyes redness with drainage for 5 months , denies injury , no vision loss . Patient admitted smoking crack this evening .

## 2022-05-05 NOTE — ED Provider Triage Note (Signed)
Emergency Medicine Provider Triage Evaluation Note  Rodney Barber , a 56 y.o. male  was evaluated in triage.  Pt complains of eye problem.  States a gnat infestation in the house, states they have gotten in his eyes.  Ongoing x4 months.  CBG 307-- did not take meds.  Used crack earlier today around 6PM.  Review of Systems  Positive: Eye symptoms Negative: fever  Physical Exam  BP (!) 220/78   Pulse 70   Temp 98.5 F (36.9 C)   Resp 19   SpO2 98%   Gen:   Awake, no distress   Resp:  Normal effort  MSK:   Moves extremities without difficulty  Other:  Conjunctiva injected and tearing, no FB noted on exam  Medical Decision Making  Medically screening exam initiated at 12:50 AM.  Appropriate orders placed.  Rahil Passey was informed that the remainder of the evaluation will be completed by another provider, this initial triage assessment does not replace that evaluation, and the importance of remaining in the ED until their evaluation is complete.  Eye problem.  Conjunctiva injected and eyes tearing on exam but no retained FB seen.  Also not taking DM meds-- unclear why.  Check CBG, labs, UA.  Will need dedicated eye exam.   Larene Pickett, PA-C 05/05/22 0222

## 2022-05-08 ENCOUNTER — Other Ambulatory Visit: Payer: Self-pay

## 2022-05-10 ENCOUNTER — Other Ambulatory Visit: Payer: Self-pay | Admitting: *Deleted

## 2022-05-10 ENCOUNTER — Other Ambulatory Visit: Payer: Self-pay

## 2022-05-11 ENCOUNTER — Other Ambulatory Visit: Payer: Self-pay | Admitting: *Deleted

## 2022-05-11 ENCOUNTER — Other Ambulatory Visit: Payer: Self-pay

## 2022-05-11 MED ORDER — ISOSORBIDE MONONITRATE ER 30 MG PO TB24
30.0000 mg | ORAL_TABLET | Freq: Every day | ORAL | 3 refills | Status: DC
  Filled 2022-05-11: qty 90, 90d supply, fill #0

## 2022-05-12 ENCOUNTER — Encounter: Payer: Self-pay | Admitting: *Deleted

## 2022-05-12 ENCOUNTER — Other Ambulatory Visit: Payer: Self-pay

## 2022-05-12 NOTE — Congregational Nurse Program (Signed)
Pillbox complete 

## 2022-05-17 ENCOUNTER — Ambulatory Visit: Payer: Medicaid Other | Admitting: Critical Care Medicine

## 2022-05-18 ENCOUNTER — Encounter: Payer: Self-pay | Admitting: *Deleted

## 2022-05-18 ENCOUNTER — Other Ambulatory Visit: Payer: Self-pay | Admitting: *Deleted

## 2022-05-18 ENCOUNTER — Other Ambulatory Visit: Payer: Self-pay | Admitting: Critical Care Medicine

## 2022-05-18 ENCOUNTER — Other Ambulatory Visit: Payer: Self-pay

## 2022-05-18 DIAGNOSIS — E1165 Type 2 diabetes mellitus with hyperglycemia: Secondary | ICD-10-CM

## 2022-05-18 MED ORDER — VALSARTAN 40 MG PO TABS
40.0000 mg | ORAL_TABLET | Freq: Every day | ORAL | 0 refills | Status: DC
  Filled 2022-05-18: qty 30, 30d supply, fill #0

## 2022-05-18 MED ORDER — GABAPENTIN 100 MG PO CAPS
100.0000 mg | ORAL_CAPSULE | Freq: Three times a day (TID) | ORAL | 0 refills | Status: DC
  Filled 2022-05-18: qty 90, 30d supply, fill #0

## 2022-05-18 MED ORDER — ISOSORBIDE MONONITRATE ER 30 MG PO TB24
30.0000 mg | ORAL_TABLET | Freq: Every day | ORAL | 3 refills | Status: DC
  Filled 2022-05-18: qty 90, 90d supply, fill #0

## 2022-05-18 MED ORDER — PANTOPRAZOLE SODIUM 40 MG PO TBEC
40.0000 mg | DELAYED_RELEASE_TABLET | Freq: Every day | ORAL | 3 refills | Status: DC
  Filled 2022-05-18: qty 30, 30d supply, fill #0

## 2022-05-18 MED ORDER — PANCRELIPASE (LIP-PROT-AMYL) 6000-19000 UNITS PO CPEP
2.0000 | ORAL_CAPSULE | Freq: Two times a day (BID) | ORAL | 0 refills | Status: DC
  Filled 2022-05-18: qty 240, 60d supply, fill #0

## 2022-05-18 MED ORDER — ROSUVASTATIN CALCIUM 40 MG PO TABS
40.0000 mg | ORAL_TABLET | Freq: Every day | ORAL | 3 refills | Status: DC
  Filled 2022-05-18: qty 90, 90d supply, fill #0

## 2022-05-18 MED ORDER — FERROUS SULFATE DRIED 200 (65 FE) MG PO TABS
200.0000 mg | ORAL_TABLET | Freq: Every day | ORAL | 3 refills | Status: DC
  Filled 2022-05-18: qty 60, fill #0

## 2022-05-18 MED ORDER — CARVEDILOL 12.5 MG PO TABS
12.5000 mg | ORAL_TABLET | Freq: Two times a day (BID) | ORAL | 0 refills | Status: DC
  Filled 2022-05-18: qty 60, 30d supply, fill #0

## 2022-05-18 NOTE — Congregational Nurse Program (Signed)
Pillbox complete. Pt would like dr Joya Gaskins to order a smaller pill of atorvastatin. Dr Joya Gaskins agreeable

## 2022-05-19 ENCOUNTER — Other Ambulatory Visit: Payer: Self-pay

## 2022-05-25 ENCOUNTER — Other Ambulatory Visit: Payer: Self-pay

## 2022-05-25 ENCOUNTER — Encounter: Payer: Self-pay | Admitting: *Deleted

## 2022-05-25 ENCOUNTER — Other Ambulatory Visit: Payer: Self-pay | Admitting: *Deleted

## 2022-05-25 MED ORDER — VALSARTAN 40 MG PO TABS
40.0000 mg | ORAL_TABLET | Freq: Every day | ORAL | 0 refills | Status: DC
  Filled 2022-05-25: qty 30, 30d supply, fill #0

## 2022-05-25 MED ORDER — POTASSIUM CHLORIDE CRYS ER 20 MEQ PO TBCR
20.0000 meq | EXTENDED_RELEASE_TABLET | Freq: Two times a day (BID) | ORAL | 0 refills | Status: DC
  Filled 2022-05-25: qty 10, 5d supply, fill #0

## 2022-05-25 NOTE — Congregational Nurse Program (Signed)
Pillbox completed

## 2022-05-31 ENCOUNTER — Other Ambulatory Visit: Payer: Self-pay

## 2022-06-08 ENCOUNTER — Other Ambulatory Visit: Payer: Self-pay

## 2022-06-08 ENCOUNTER — Other Ambulatory Visit: Payer: Self-pay | Admitting: *Deleted

## 2022-06-08 MED ORDER — ROSUVASTATIN CALCIUM 40 MG PO TABS
40.0000 mg | ORAL_TABLET | Freq: Every day | ORAL | 3 refills | Status: DC
  Filled 2022-06-08: qty 90, 90d supply, fill #0

## 2022-06-15 ENCOUNTER — Other Ambulatory Visit: Payer: Self-pay | Admitting: *Deleted

## 2022-06-15 ENCOUNTER — Other Ambulatory Visit: Payer: Self-pay

## 2022-06-15 ENCOUNTER — Encounter: Payer: Self-pay | Admitting: *Deleted

## 2022-06-15 MED ORDER — CARVEDILOL 12.5 MG PO TABS
12.5000 mg | ORAL_TABLET | Freq: Two times a day (BID) | ORAL | 0 refills | Status: DC
  Filled 2022-06-15: qty 60, 30d supply, fill #0

## 2022-06-15 MED ORDER — VALSARTAN 40 MG PO TABS
40.0000 mg | ORAL_TABLET | Freq: Every day | ORAL | 0 refills | Status: DC
  Filled 2022-06-15: qty 30, 30d supply, fill #0

## 2022-06-15 MED ORDER — ROSUVASTATIN CALCIUM 40 MG PO TABS
40.0000 mg | ORAL_TABLET | Freq: Every day | ORAL | 3 refills | Status: DC
  Filled 2022-06-15: qty 90, 90d supply, fill #0

## 2022-06-15 MED ORDER — FERROUS SULFATE 325 (65 FE) MG PO TABS
325.0000 mg | ORAL_TABLET | Freq: Two times a day (BID) | ORAL | 3 refills | Status: DC
  Filled 2022-06-15: qty 60, 30d supply, fill #0

## 2022-06-15 MED ORDER — PANTOPRAZOLE SODIUM 40 MG PO TBEC
40.0000 mg | DELAYED_RELEASE_TABLET | Freq: Every day | ORAL | 3 refills | Status: DC
  Filled 2022-06-15: qty 30, 30d supply, fill #0

## 2022-06-16 ENCOUNTER — Other Ambulatory Visit: Payer: Self-pay

## 2022-06-19 ENCOUNTER — Other Ambulatory Visit: Payer: Self-pay

## 2022-06-22 ENCOUNTER — Other Ambulatory Visit: Payer: Self-pay

## 2022-06-22 ENCOUNTER — Other Ambulatory Visit: Payer: Self-pay | Admitting: *Deleted

## 2022-06-22 MED ORDER — PANTOPRAZOLE SODIUM 40 MG PO TBEC
40.0000 mg | DELAYED_RELEASE_TABLET | Freq: Every day | ORAL | 3 refills | Status: DC
  Filled 2022-07-27: qty 90, 90d supply, fill #0

## 2022-06-22 MED ORDER — PANCRELIPASE (LIP-PROT-AMYL) 6000-19000 UNITS PO CPEP
2.0000 | ORAL_CAPSULE | Freq: Two times a day (BID) | ORAL | 0 refills | Status: DC
  Filled 2022-06-22: qty 240, 60d supply, fill #0

## 2022-06-22 MED ORDER — VALSARTAN 40 MG PO TABS: 40.0000 mg | ORAL_TABLET | Freq: Every day | ORAL | 0 refills | Status: DC

## 2022-06-22 MED ORDER — CARVEDILOL 12.5 MG PO TABS: 12.5000 mg | ORAL_TABLET | Freq: Two times a day (BID) | ORAL | 0 refills | Status: DC

## 2022-06-23 ENCOUNTER — Other Ambulatory Visit: Payer: Self-pay

## 2022-07-04 DIAGNOSIS — E1169 Type 2 diabetes mellitus with other specified complication: Secondary | ICD-10-CM

## 2022-07-05 NOTE — Congregational Nurse Program (Signed)
  Dept: [phone]   Congregational Nurse Program Note  Date of Encounter: 07/04/2022  Previous resident of [facility] came to clinic to have blood pressure and blood glucose checked. BP 172/81, pulse 67, O2 Sat 98%, blood glucose 180 pc breakfast.  Requested assistance with medications because he had not taken medications for two days, filled pillbox and explained which medications were for hypertension. Recommended that he come back at Wednesday clinic to recheck blood pressure. 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 Yes    Student Assistance N/A    Location Patient Served  [facility]    Visit Setting with Client Organization    Patient Status Unknown   Has own apartment, no longer living at [employer].   Insurance Medicaid    Insurance/Financial Assistance Referral N/A    Medication N/A    Medical Provider Yes    Screening Referrals Made N/A    Medical Referrals Made [facility] PCP/[facility]inic    Medical Appointment Made N/A    Recently w/o PCP, now 1st time PCP visit completed due to CNs referral or appointment made N/A    Food N/A    Transportation Need transportation assistance    Housing/Utilities N/A    Interpersonal Safety N/A    Interventions Advocate/Support;Counsel;Reviewed Medications    Abnormal to Normal Screening Since Last CN Visit N/A    Screenings CN Performed Blood Pressure;Blood Glucose;Pulse Ox    Sent Client to Lab for: N/A    Did client attend any of the following based off CNs referral or appointments made? N/A    ED Visit Averted N/A    Life-Saving Intervention Made N/A

## 2022-07-06 ENCOUNTER — Other Ambulatory Visit: Payer: Self-pay | Admitting: *Deleted

## 2022-07-06 ENCOUNTER — Other Ambulatory Visit: Payer: Self-pay

## 2022-07-06 MED ORDER — INSULIN GLARGINE 100 UNIT/ML SOLOSTAR PEN
25.0000 [IU] | PEN_INJECTOR | Freq: Every day | SUBCUTANEOUS | 2 refills | Status: DC
  Filled 2022-07-06: qty 9, 36d supply, fill #0

## 2022-07-06 MED ORDER — INSULIN LISPRO (1 UNIT DIAL) 100 UNIT/ML (KWIKPEN)
5.0000 [IU] | PEN_INJECTOR | Freq: Two times a day (BID) | SUBCUTANEOUS | 1 refills | Status: DC
  Filled 2022-07-06: qty 3, 30d supply, fill #0

## 2022-07-12 ENCOUNTER — Other Ambulatory Visit: Payer: Self-pay

## 2022-07-13 ENCOUNTER — Other Ambulatory Visit: Payer: Self-pay

## 2022-07-13 NOTE — Congregational Nurse Program (Signed)
  Dept: [phone]   Congregational Nurse Program Note  Date of Encounter: 07/13/2022  Clinic visit for assistance with his medications.  Reviee\wed medication orders with him and place refill order for Spironolactone.  Pharmacy to verify with MD if it is to be refilled. 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 Yes    Student Assistance N/A    Location Patient Served  [facility]    Visit Setting with Client Organization    Patient Status Unknown   Has own apartment, no longer living at [employer].   Insurance Medicaid    Insurance/Financial Assistance Referral N/A    Medication N/A    Medical Provider Yes    Screening Referrals Made N/A    Medical Referrals Made N/A    Medical Appointment Made N/A    Recently w/o PCP, now 1st time PCP visit completed due to CNs referral or appointment made N/A    Food N/A    Transportation Need transportation assistance    Housing/Utilities N/A    Interpersonal Safety N/A    Interventions Advocate/Support;Counsel;Reviewed Medications    Abnormal to Normal Screening Since Last CN Visit N/A    Screenings CN Performed N/A    Sent Client to Lab for: N/A    Did client attend any of the following based off CNs referral or appointments made? N/A    ED Visit Averted N/A    Life-Saving Intervention Made N/A

## 2022-07-20 ENCOUNTER — Other Ambulatory Visit: Payer: Self-pay | Admitting: Critical Care Medicine

## 2022-07-20 ENCOUNTER — Other Ambulatory Visit: Payer: Self-pay

## 2022-07-20 MED ORDER — GABAPENTIN 100 MG PO CAPS
100.0000 mg | ORAL_CAPSULE | Freq: Three times a day (TID) | ORAL | 0 refills | Status: DC
  Filled 2022-07-20: qty 90, 30d supply, fill #0

## 2022-07-20 NOTE — Congregational Nurse Program (Signed)
  Dept: [phone]   Congregational Nurse Program Note  Date of Encounter: 07/20/2022  Clinic visit for assistance with medications, reviewed all of medicines and called in prescriptions.  Two of prescriptions needed physician approval for refill, pharmacy to contact MD and notify him when medicine is ready. 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 Yes    Student Assistance N/A    Location Patient Served  [facility]    Visit Setting with Client Organization    Patient Status Unknown   Has own apartment, no longer living at [employer].   Insurance Medicaid    Insurance/Financial Assistance Referral N/A    Medication N/A    Medical Provider Yes    Screening Referrals Made N/A    Medical Referrals Made N/A    Medical Appointment Made N/A    Recently w/o PCP, now 1st time PCP visit completed due to CNs referral or appointment made N/A    Food N/A    Transportation Need transportation assistance    Housing/Utilities N/A    Interpersonal Safety N/A    Interventions Advocate/Support;Counsel;Reviewed Medications    Abnormal to Normal Screening Since Last CN Visit N/A    Screenings CN Performed N/A    Sent Client to Lab for: N/A    Did client attend any of the following based off CNs referral or appointments made? N/A    ED Visit Averted N/A    Life-Saving Intervention Made N/A

## 2022-07-27 ENCOUNTER — Other Ambulatory Visit: Payer: Self-pay

## 2022-07-27 NOTE — Congregational Nurse Program (Signed)
  Dept: [phone]   Congregational Nurse Program Note  Date of Encounter: 07/27/2022  Clinic visit for assistance with medicines.  Blood pressure 105/61, pulse 56, O2 Sat 95%. Prescription refills called.  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 Yes    Student Assistance N/A    Location Patient Served  [facility]    Visit Setting with Client Organization    Patient Status Unknown   Has own apartment, no longer living at [employer].   Insurance Medicaid    Insurance/Financial Assistance Referral N/A    Medication N/A    Medical Provider Yes    Screening Referrals Made N/A    Medical Referrals Made [facility] PCP/[facility]    Medical Appointment Made N/A    Recently w/o PCP, now 1st time PCP visit completed due to CNs referral or appointment made N/A    Food N/A    Transportation Need transportation assistance    Housing/Utilities N/A    Interpersonal Safety N/A    Interventions Advocate/Support;Counsel;Reviewed Medications;Spiritual Care    Abnormal to Normal Screening Since Last CN Visit N/A    Screenings CN Performed Blood Pressure;Pulse Ox    Sent Client to Lab for: N/A    Did client attend any of the following based off CNs referral or appointments made? N/A    ED Visit Averted N/A    Life-Saving Intervention Made N/A

## 2022-08-01 NOTE — Progress Notes (Unsigned)
Established Patient Office Visit  Subjective   Patient ID: Rodney Barber, male    DOB: [birth year]  Age: 56 y.o. MRN: [id number]  No chief complaint on file.   09/2021 Rodney Barber presents for primary care follow-up.  Patient is currently at the [facility] shelter but is about to get housing and now has full Medicaid and Social Security disability.  Patient on arrival has blood pressure 110/54 blood sugar of 96.  He is taking 25 units of insulin glargine in the morning and variably takes 5 units of Humalog lispro in the morning and occasionally a dose at lunch.  He states his blood sugars are in the low 100s fasting midmorning 175 range and in the late afternoon he does get over 200 on occasion.  On arrival hemoglobin A1c is 9.1 which is still markedly better than previous values of greater than 14 in the past.  He has no other real complaints at this visit.   The patient needs screening labs at this visit he is still smoking some cigarettes.   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  Patient needs refills on iron carvedilol and Creon  On arrival blood pressure is good 117/62 blood sugar 138 he states blood sugars at home anywhere from 1 60-1 80.  Patient is on 25 units of Lantus daily and takes the 5 units twice daily of Humalog  Patient now has housing and is awaiting his furniture that arrives this week.  He has no other complaints.  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 He is on Creon pancreatic enzyme replacement.  At the previous visit, he complained of left  claudication. ABI was abnormal. He was referred to vascular surgery and has an upcoming appointment scheduled 03/30/22.   At the previous visit, we discontinued iron supplement as his levels were normalized. He reports he thinks he is still taking them.    Today's Blood pressure is 175/80. Blood sugar = 356. He did not take his medications today as he was rushing to get out the door. Normally he is compliant with his medications. He does take his sugars at home and reports they normally range from 160's-180's. He does not take blood pressures at home as he does not have a machine. He is interested in obtaining one.   Reports some light headed feelings occasionally in the morning. This is normally alleviated with a meal. He does believe he has lost about 10 pounds in the past 3 months   He is also requesting a referral to opthalmology, reports he hasn't seen them in years. Was previously diagnosed with cataracts   Lastly, he previously was following with urology regarding a left kidney stone. He has not seen them yet and is requesting another referral.   He was able to obtain furniture for his apartment! Although he does report a "nat" issue. Reports some itching of his forearms and neck intermittently. Also notices a rash at the same time in those areas. He believes this may be eczema related.    He is still smoking cigarettes, reports about 3 daily. No alcohol or recreational drug use.   08/02/22  Patient Active Problem List   Diagnosis Date Noted   Visual changes 03/06/2022   Abnormal ankle brachial index (ABI) 02/14/2022   Iron deficiency 02/14/2022   Encounter for drug screening 02/14/2022   Nephrolithiasis 12/02/2021   Coronary artery disease 04/20/2020   Chronic combined systolic (congestive) and diastolic (congestive) heart failure (HCC)    Essential hypertension    Tobacco abuse 05/14/2019   AKI (acute kidney injury) (HCC)    Type 2 diabetes mellitus (HCC) 03/15/2018    Hyperlipidemia associated with type 2 diabetes mellitus (HCC) 09/08/2013   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   Anemia    Diabetes mellitus    Hypertension    Pancreatitis    Substance use disorder 02/18/2019   Past Surgical History:  Procedure Laterality Date   ERCP  06/27/2011   Procedure: ENDOSCOPIC RETROGRADE CHOLANGIOPANCREATOGRAPHY (ERCP);  Surgeon: Rodney Kuba, MD;  Location: [facility];  Service: Endoscopy;  Laterality: N/A;   ERCP W/ PLASTIC STENT PLACEMENT  03/2011   IR US GUIDE BX ASP/DRAIN  05/16/2019   LEFT HEART CATH AND CORONARY ANGIOGRAPHY N/A 01/02/2020   Procedure: LEFT HEART CATH AND CORONARY ANGIOGRAPHY;  Surgeon: Swaziland, Peter M, MD;  Location: [facility] INVASIVE CV LAB;  Service: Cardiovascular;  Laterality: N/A;   TEE WITHOUT CARDIOVERSION N/A 05/20/2019   Procedure: TRANSESOPHAGEAL ECHOCARDIOGRAM (TEE);  Surgeon: Rodney Nose, MD;  Location: [facility] ENDOSCOPY;  Service: Cardiovascular;  Laterality: N/A;   Social History   Tobacco Use   Smoking status: Every Day    Packs/day: 0.50    Years: 30.00    Total pack years: 15.00    Types: Cigarettes   Smokeless tobacco: Never   Tobacco comments:    6 cigarett /day  Vaping Use   Vaping Use: Never used  Substance Use Topics   Alcohol use: No   Drug use: Not Currently    Types: "Crack" cocaine, Other-see comments, Cocaine    Comment: last smoked crack 12-16-16   Family History  Problem Relation Age of Onset   Diabetes Mother    Diabetes Brother    No Known Allergies    Review of Systems  Constitutional:  Positive for weight loss.       10 lb weight loss 3 months  HENT: Negative.    Eyes: Negative.   Respiratory: Negative.    Cardiovascular:  Positive for claudication.  Gastrointestinal: Negative.   Genitourinary: Negative.   Musculoskeletal:        Left leg pain  Skin:  Positive for itching.       Neck and forearm rash, itchiness   Neurological:        LightHeaded in AM   Endo/Heme/Allergies: Negative.   Psychiatric/Behavioral: Negative.        Objective:     There were no vitals taken for this visit.    Physical Exam HENT:     Head: Normocephalic.     Right Ear: Tympanic membrane, ear canal and external ear normal.     Left Ear: Tympanic membrane, ear canal and external ear normal.     Barber: Barber normal.     Mouth/Throat:     Mouth: Mucous membranes are moist.     Pharynx: Oropharynx is clear.  Eyes:     Conjunctiva/sclera: Conjunctivae normal.  Cardiovascular:     Rate and Rhythm: Normal rate and regular rhythm.     Pulses: Normal pulses.     Heart sounds: Normal  heart sounds.  Pulmonary:     Effort: Pulmonary effort is normal.     Breath sounds: Normal breath sounds.  Abdominal:     General: Bowel sounds are normal.     Palpations: Abdomen is soft.  Skin:    General: Skin is warm.  Neurological:     Mental Status: He is alert and oriented to person, place, and time. Mental status is at baseline.     Comments: Ambulates with cane   Psychiatric:        Mood and Affect: Mood normal.        Behavior: Behavior normal.      No results found for any visits on 08/02/22.      The ASCVD Risk score (Arnett DK, et al., 2019) failed to calculate for the following reasons:   The valid total cholesterol range is 130 to 320 mg/dL    Assessment & Plan:   Problem List Items Addressed This Visit   None  No follow-ups on file.    Shan Levans, MD

## 2022-08-02 ENCOUNTER — Encounter: Payer: Self-pay | Admitting: Critical Care Medicine

## 2022-08-02 ENCOUNTER — Ambulatory Visit: Payer: Medicaid Other | Attending: Critical Care Medicine | Admitting: Critical Care Medicine

## 2022-08-02 ENCOUNTER — Other Ambulatory Visit: Payer: Self-pay

## 2022-08-02 VITALS — BP 146/69 | HR 61 | Temp 97.7°F | Ht 68.0 in | Wt 133.0 lb

## 2022-08-02 DIAGNOSIS — E1142 Type 2 diabetes mellitus with diabetic polyneuropathy: Secondary | ICD-10-CM | POA: Diagnosis not present

## 2022-08-02 DIAGNOSIS — I10 Essential (primary) hypertension: Secondary | ICD-10-CM | POA: Diagnosis not present

## 2022-08-02 DIAGNOSIS — Z794 Long term (current) use of insulin: Secondary | ICD-10-CM

## 2022-08-02 DIAGNOSIS — Z23 Encounter for immunization: Secondary | ICD-10-CM

## 2022-08-02 DIAGNOSIS — E785 Hyperlipidemia, unspecified: Secondary | ICD-10-CM | POA: Diagnosis not present

## 2022-08-02 DIAGNOSIS — N179 Acute kidney failure, unspecified: Secondary | ICD-10-CM

## 2022-08-02 DIAGNOSIS — E1169 Type 2 diabetes mellitus with other specified complication: Secondary | ICD-10-CM | POA: Diagnosis not present

## 2022-08-02 LAB — GLUCOSE, POCT (MANUAL RESULT ENTRY): POC Glucose: 248 mg/dl — AB (ref 70–99)

## 2022-08-02 LAB — POCT GLYCOSYLATED HEMOGLOBIN (HGB A1C): HbA1c, POC (controlled diabetic range): 8 % — AB (ref 0.0–7.0)

## 2022-08-02 MED ORDER — FERROUS SULFATE 325 (65 FE) MG PO TABS
325.0000 mg | ORAL_TABLET | Freq: Two times a day (BID) | ORAL | 3 refills | Status: DC
  Filled 2022-08-02: qty 60, 30d supply, fill #0

## 2022-08-02 MED ORDER — VALSARTAN 40 MG PO TABS
40.0000 mg | ORAL_TABLET | Freq: Every day | ORAL | 23 refills | Status: DC
  Filled 2022-08-02: qty 90, 90d supply, fill #0

## 2022-08-02 MED ORDER — CARVEDILOL 12.5 MG PO TABS
12.5000 mg | ORAL_TABLET | Freq: Two times a day (BID) | ORAL | 6 refills | Status: DC
  Filled 2022-08-02: qty 120, 60d supply, fill #0
  Filled 2022-10-12: qty 120, 60d supply, fill #1

## 2022-08-02 MED ORDER — INSULIN GLARGINE 100 UNIT/ML SOLOSTAR PEN
25.0000 [IU] | PEN_INJECTOR | Freq: Every day | SUBCUTANEOUS | 2 refills | Status: DC
  Filled 2022-08-02: qty 15, 60d supply, fill #0

## 2022-08-02 MED ORDER — ROSUVASTATIN CALCIUM 40 MG PO TABS
40.0000 mg | ORAL_TABLET | Freq: Every day | ORAL | 3 refills | Status: DC
  Filled 2022-08-02 – 2022-08-17 (×2): qty 90, 90d supply, fill #0

## 2022-08-02 MED ORDER — PANTOPRAZOLE SODIUM 40 MG PO TBEC: 40.0000 mg | DELAYED_RELEASE_TABLET | Freq: Every day | ORAL | 3 refills | Status: DC

## 2022-08-02 MED ORDER — INSULIN LISPRO (1 UNIT DIAL) 100 UNIT/ML (KWIKPEN)
5.0000 [IU] | PEN_INJECTOR | Freq: Two times a day (BID) | SUBCUTANEOUS | 1 refills | Status: DC
  Filled 2022-08-02: qty 3, 28d supply, fill #0

## 2022-08-02 MED ORDER — SPIRONOLACTONE 25 MG PO TABS
25.0000 mg | ORAL_TABLET | Freq: Every day | ORAL | 3 refills | Status: DC
  Filled 2022-08-02 – 2022-10-26 (×2): qty 90, 90d supply, fill #0

## 2022-08-02 MED ORDER — PANCRELIPASE (LIP-PROT-AMYL) 6000-19000 UNITS PO CPEP
2.0000 | ORAL_CAPSULE | Freq: Two times a day (BID) | ORAL | 2 refills | Status: DC
  Filled 2022-08-02: qty 240, 60d supply, fill #0

## 2022-08-02 MED ORDER — ISOSORBIDE MONONITRATE ER 30 MG PO TB24
30.0000 mg | ORAL_TABLET | Freq: Every day | ORAL | 3 refills | Status: DC
  Filled 2022-08-02: qty 90, 90d supply, fill #0

## 2022-08-02 MED ORDER — GABAPENTIN 100 MG PO CAPS
100.0000 mg | ORAL_CAPSULE | Freq: Three times a day (TID) | ORAL | 2 refills | Status: DC
  Filled 2022-08-02 – 2022-08-31 (×2): qty 90, 30d supply, fill #0

## 2022-08-02 NOTE — Patient Instructions (Signed)
Refills on all medications sent to pharmacy downstairs  Flu vaccine given  Letter given for your visit today  Complete set of screening labs obtained and urine for protein  Return to Dr. [name] 4 months

## 2022-08-03 ENCOUNTER — Other Ambulatory Visit (HOSPITAL_COMMUNITY): Payer: Self-pay

## 2022-08-03 LAB — BMP8+EGFR
BUN/Creatinine Ratio: 11 (ref 9–20)
BUN: 17 mg/dL (ref 6–24)
CO2: 22 mmol/L (ref 20–29)
Calcium: 8.6 mg/dL — ABNORMAL LOW (ref 8.7–10.2)
Chloride: 107 mmol/L — ABNORMAL HIGH (ref 96–106)
Creatinine, Ser: 1.5 mg/dL — ABNORMAL HIGH (ref 0.76–1.27)
Glucose: 215 mg/dL — ABNORMAL HIGH (ref 70–99)
Potassium: 3.6 mmol/L (ref 3.5–5.2)
Sodium: 142 mmol/L (ref 134–144)
eGFR: 54 mL/min/{1.73_m2} — ABNORMAL LOW (ref >59)

## 2022-08-03 LAB — CBC WITH DIFFERENTIAL/PLATELET
Basophils Absolute: 0.1 10*3/uL (ref 0.0–0.2)
Basos: 1 %
EOS (ABSOLUTE): 0.2 10*3/uL (ref 0.0–0.4)
Eos: 3 %
Hematocrit: 32.2 % — ABNORMAL LOW (ref 37.5–51.0)
Hemoglobin: 10.4 g/dL — ABNORMAL LOW (ref 13.0–17.7)
Immature Grans (Abs): 0 10*3/uL (ref 0.0–0.1)
Immature Granulocytes: 0 %
Lymphocytes Absolute: 1.6 10*3/uL (ref 0.7–3.1)
Lymphs: 21 %
MCH: 29.1 pg (ref 26.6–33.0)
MCHC: 32.3 g/dL (ref 31.5–35.7)
MCV: 90 fL (ref 79–97)
Monocytes Absolute: 0.5 10*3/uL (ref 0.1–0.9)
Monocytes: 6 %
Neutrophils Absolute: 5.2 10*3/uL (ref 1.4–7.0)
Neutrophils: 69 %
Platelets: 142 10*3/uL — ABNORMAL LOW (ref 150–450)
RBC: 3.57 x10E6/uL — ABNORMAL LOW (ref 4.14–5.80)
RDW: 12.8 % (ref 11.6–15.4)
WBC: 7.7 10*3/uL (ref 3.4–10.8)

## 2022-08-03 LAB — LIPID PANEL
Chol/HDL Ratio: 2.3 ratio (ref 0.0–5.0)
Cholesterol, Total: 91 mg/dL — ABNORMAL LOW (ref 100–199)
HDL: 40 mg/dL (ref >39)
LDL Chol Calc (NIH): 37 mg/dL (ref 0–99)
Triglycerides: 64 mg/dL (ref 0–149)
VLDL Cholesterol Cal: 14 mg/dL (ref 5–40)

## 2022-08-03 LAB — MICROALBUMIN / CREATININE URINE RATIO
Creatinine, Urine: 97.5 mg/dL
Microalb/Creat Ratio: 893 mg/g creat — ABNORMAL HIGH (ref 0–29)
Microalbumin, Urine: 871 ug/mL

## 2022-08-03 NOTE — Assessment & Plan Note (Signed)
-  Continue statins ?

## 2022-08-03 NOTE — Assessment & Plan Note (Signed)
Reassess renal function 

## 2022-08-03 NOTE — Congregational Nurse Program (Signed)
  Dept: [phone]   Congregational Nurse Program Note  Date of Encounter: 08/03/2022  Clinic visit to review medications changed from MD visit on 12/27.  Reviewed all medications, reason for the medicine and potential side effects. Is taking insulin as prescribed and voiced understanding of medications. 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 Yes    Student Assistance N/A    Location Patient Served  [facility]    Visit Setting with Client Organization    Patient Status Unknown   Has own apartment, no longer living at [employer].   Insurance Medicaid    Insurance/Financial Assistance Referral N/A    Medication N/A    Medical Provider Yes    Screening Referrals Made N/A    Medical Referrals Made [facility] PCP/[facility]inic    Medical Appointment Made N/A    Recently w/o PCP, now 1st time PCP visit completed due to CNs referral or appointment made N/A    Food N/A    Transportation Need transportation assistance;Provided transportation assistance    Housing/Utilities N/A    Interpersonal Safety N/A    Interventions Advocate/Support;Counsel;Reviewed Medications;Spiritual Care;Educate;Case Management    Abnormal to Normal Screening Since Last CN Visit N/A    Sent Client to Lab for: N/A    Did client attend any of the following based off CNs referral or appointments made? Medical    ED Visit Averted N/A    Life-Saving Intervention Made N/A

## 2022-08-03 NOTE — Assessment & Plan Note (Signed)
Continue current medications. 

## 2022-08-03 NOTE — Assessment & Plan Note (Signed)
Type 2 diabetes currently uncontrolled A1c 8.0 continue current medications

## 2022-08-04 ENCOUNTER — Telehealth (INDEPENDENT_AMBULATORY_CARE_PROVIDER_SITE_OTHER): Payer: Self-pay

## 2022-08-04 NOTE — Telephone Encounter (Signed)
Pt was called and vm was left, Information has been sent to nurse pool.   

## 2022-08-04 NOTE — Telephone Encounter (Signed)
-----   Message from [name], MD sent at 08/04/2022 10:08 AM EST ----- Let pt know labs stable no medication change

## 2022-08-04 NOTE — Progress Notes (Signed)
Let pt know labs stable no medication change

## 2022-08-10 NOTE — Congregational Nurse Program (Signed)
  Dept: [phone]   Congregational Nurse Program Note  Date of Encounter: 08/10/2022  Clinic visit to assist with filling pillbox and to check blood pressure.  He is checking blood glucose twice daily but not checking BP at home.  BP 128/61, pulse 56 and regular, O2 Sat 96%.  States AM blood glucose about 140, PM levels in the 180's.  Educated regarding target blood glucose levels.  Reviewed all medicines and potential side effects and assisted with filling pillbox. Past Medical History: Past Medical History:  Diagnosis Date   Alcohol-induced chronic pancreatitis ([place]) 07/22/2013   Anemia    Diabetes mellitus    Hypertension    Pancreatitis    Substance use disorder 02/18/2019    Encounter Details:  CNP Questionnaire - 08/10/22 1030       Questionnaire   Ask client: Do you give verbal consent for me to treat you today? Yes    Student Assistance N/A    Location Patient [facility]    Visit Setting with Client Organization    Patient Status Unknown   Has own apartment, no longer living at [employer].   Insurance Medicaid    Insurance/Financial Assistance Referral N/A    Medication N/A    Medical Provider Yes    Screening Referrals Made N/A    Medical Referrals Made N/A    Medical Appointment Made N/A    Recently w/o PCP, now 1st time PCP visit completed due to CNs referral or appointment made N/A    Food N/A    Transportation Need transportation assistance    Housing/Utilities N/A    Interpersonal Safety N/A    Interventions Advocate/Support;Counsel;Reviewed Medications;Spiritual Care;Educate;Case Management    Abnormal to Normal Screening Since Last CN Visit N/A    Screenings CN Performed N/A    Sent Client to Lab for: N/A    Did client attend any of the following based off CNs referral or appointments made? Medical    ED Visit Averted N/A    Life-Saving Intervention Made N/A

## 2022-08-17 ENCOUNTER — Other Ambulatory Visit: Payer: Self-pay

## 2022-08-17 NOTE — Congregational Nurse Program (Signed)
  Dept: [phone]   Congregational Nurse Program Note  Date of Encounter: 08/17/2022  Clinic visit to assist with filling weekly pillbox.  Reviewed all medication orders, discussed reason for the medicine, dosage and possible side effects.  Educated regarding types of carbohydrate foods and preparation to decrease use of fats.  States his blood sugar in AM past week has been 130 or less. Past Medical History: Past Medical History:  Diagnosis Date   Alcohol-induced chronic pancreatitis ([place]) 07/22/2013   Anemia    Diabetes mellitus    Hypertension    Pancreatitis    Substance use disorder 02/18/2019    Encounter Details:

## 2022-08-18 ENCOUNTER — Other Ambulatory Visit: Payer: Self-pay

## 2022-08-24 NOTE — Congregational Nurse Program (Signed)
  Dept: [phone]   Congregational Nurse Program Note  Date of Encounter: 08/24/2022  Clinic visit to assist with medications and check blood pressure.  BP 132/61, pulse 66 and regular, O2 Sat 99%.  Reviewed all of medicines, dosage, reason for the medicine and possible side effects.  Assisted with filling pillbox. Past Medical History: Past Medical History:  Diagnosis Date   Alcohol-induced chronic pancreatitis ([place]) 07/22/2013   Anemia    Diabetes mellitus    Hypertension    Pancreatitis    Substance use disorder 02/18/2019    Encounter Details:  CNP Questionnaire - 08/24/22 1228       Questionnaire   Ask client: Do you give verbal consent for me to treat you today? Yes    Student Assistance [employer] Nurse    Location Patient [facility]    Visit Setting with Client Organization    Patient Status Unknown   Has own apartment, no longer living at [facility].   Insurance Medicaid    Insurance/Financial Assistance Referral N/A    Medication N/A    Medical Provider Yes    Screening Referrals Made N/A    Medical Referrals Made N/A    Medical Appointment Made N/A    Recently w/o PCP, now 1st time PCP visit completed due to CNs referral or appointment made N/A    Food N/A    Transportation Need transportation assistance    Housing/Utilities N/A    Interpersonal Safety N/A    Interventions Advocate/Support;Counsel;Reviewed Medications;Educate    Abnormal to Normal Screening Since Last CN Visit N/A    Screenings CN Performed Blood Pressure;Pulse Ox    Sent Client to Lab for: N/A    Did client attend any of the following based off CNs referral or appointments made? Medical    ED Visit Averted N/A    Life-Saving Intervention Made N/A

## 2022-08-31 ENCOUNTER — Other Ambulatory Visit: Payer: Self-pay

## 2022-08-31 NOTE — Congregational Nurse Program (Signed)
  Dept: [phone]   Congregational Nurse Program Note  Date of Encounter: 08/31/2022  Clinic visit for assistance with medications.  He is able to identify all of medicines and reason for the medication.  Assisted with filling weekly pillbox and called in needed refill for him to pick up the medicine. Past Medical History: Past Medical History:  Diagnosis Date   Alcohol-induced chronic pancreatitis ([place]) 07/22/2013   Anemia    Diabetes mellitus    Hypertension    Pancreatitis    Substance use disorder 02/18/2019    Encounter Details:  CNP Questionnaire - 08/31/22 1230       Questionnaire   Ask client: Do you give verbal consent for me to treat you today? 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   Insurance Medicaid    Insurance/Financial Assistance Referral N/A    Medication N/A    Medical Provider Yes    Screening Referrals Made N/A    Medical Referrals Made N/A    Medical Appointment Made N/A    Recently w/o PCP, now 1st time PCP visit completed due to CNs referral or appointment made N/A    Food N/A    Transportation Need transportation assistance    Housing/Utilities N/A    Interpersonal Safety N/A    Interventions Advocate/Support;Counsel;Reviewed Medications;Educate    Abnormal to Normal Screening Since Last CN Visit N/A    Screenings CN Performed N/A    Sent Client to Lab for: N/A    Did client attend any of the following based off CNs referral or appointments made? N/A    ED Visit Averted N/A    Life-Saving Intervention Made N/A

## 2022-09-06 ENCOUNTER — Other Ambulatory Visit: Payer: Self-pay

## 2022-09-07 ENCOUNTER — Other Ambulatory Visit: Payer: Self-pay | Admitting: Critical Care Medicine

## 2022-09-07 MED ORDER — GABAPENTIN 100 MG PO CAPS: 100.0000 mg | ORAL_CAPSULE | Freq: Two times a day (BID) | ORAL | 2 refills | Status: DC

## 2022-09-07 MED ORDER — PANCRELIPASE (LIP-PROT-AMYL) 6000-19000 UNITS PO CPEP: 1.0000 | ORAL_CAPSULE | Freq: Two times a day (BID) | ORAL | 2 refills | Status: AC

## 2022-09-07 NOTE — Progress Notes (Signed)
Allergies as of 09/07/2022   No Known Allergies      Medication List        Accurate as of September 07, 2022  3:20 PM. If you have any questions, ask your nurse or doctor.          Accu-Chek Guide test strip Generic drug: glucose blood Use to check blood sugar four times daily.   Accu-Chek Guide w/Device Kit Use to check blood sugar four times daily. E11.65   Accu-Chek Softclix Lancets lancets Ues to check blood sugar 4 (four) times daily.   acetaminophen 325 MG tablet Commonly known as: TYLENOL Take 2 tablets (650 mg total) by mouth every 6 (six) hours as needed for mild pain.   aspirin 81 MG chewable tablet Chew 1 tablet (81 mg total) by mouth daily.   Blood Pressure Kit Devi Use to measure blood pressure   carvedilol 12.5 MG tablet Commonly known as: COREG Take 1 tablet (12.5 mg total) by mouth 2 (two) times daily with a meal.   feeding supplement (GLUCERNA SHAKE) Liqd Take 237 mLs by mouth 3 (three) times daily between meals.   ferrous sulfate 325 (65 FE) MG tablet Commonly known as: FeroSul Take 1 tablet (325 mg total) by mouth 2 (two) times daily with a meal.   gabapentin 100 MG capsule Commonly known as: Neurontin Take 1 capsule (100 mg total) by mouth 2 (two) times daily. What changed: when to take this   Glucose 4-6 GM-MG Chew Chew one as needed for blood glucose < 70 What changed:  how much to take how to take this when to take this reasons to take this   HumaLOG KwikPen 100 UNIT/ML KwikPen Generic drug: insulin lispro Inject 5 Units into the skin 2 (two) times daily before a meal. Skip your dose if you do not eat or if your sugar is <150.   isosorbide mononitrate 30 MG 24 hr tablet Commonly known as: IMDUR Take 1 tablet (30 mg total) by mouth daily.   Lantus SoloStar 100 UNIT/ML Solostar Pen Generic drug: insulin glargine Inject 25 Units into the skin daily.   nicotine 14 mg/24hr patch Commonly known as: NICODERM CQ - dosed in mg/24  hours Place 1 patch (14 mg total) onto the skin daily.   nitroGLYCERIN 0.4 MG SL tablet Commonly known as: NITROSTAT Place 1 tablet (0.4 mg total) under the tongue every 5 (five) minutes as needed for chest pain.   Pancrelipase (Lip-Prot-Amyl) 6000-19000 units Cpep Take 1 capsule (6,000 Units total) by mouth 2 (two) times daily before a meal. What changed: how much to take   pantoprazole 40 MG tablet Commonly known as: PROTONIX Take 1 tablet (40 mg total) by mouth daily.   rosuvastatin 40 MG tablet Commonly known as: CRESTOR Take 1 tablet (40 mg total) by mouth daily.   spironolactone 25 MG tablet Commonly known as: ALDACTONE Take 1 tablet (25 mg total) by mouth daily.   TechLite Pen Needles 31G X 5 MM Misc Generic drug: Insulin Pen Needle USE WITH INSULIN PENS   valsartan 40 MG tablet Commonly known as: DIOVAN Take 1 tablet (40 mg total) by mouth daily.

## 2022-09-20 NOTE — Congregational Nurse Program (Signed)
  Dept: [place] Nurse Program Note  Date of Encounter: 09/20/2022  Clinic visit to assist with filling weekly pillbox.  Reviewed medications, name, dosage, reason for the medications and possible side effects.  Discussed foods to include in meals to help maintain blood glucose within normal ranges. Past Medical History: Past Medical History:  Diagnosis Date   Alcohol-induced chronic pancreatitis ([place]) 07/22/2013   Anemia    Diabetes mellitus    Hypertension    Pancreatitis    Substance use disorder 02/18/2019    Encounter Details:

## 2022-09-20 NOTE — Congregational Nurse Program (Signed)
  Dept: [phone]   Congregational Nurse Program Note  Date of Encounter: 09/14/2022  Clinic visit to assist with filling medication pillbox.  BP noted to be elevated today, 151/69, pulse 61, O2 Sat 100%.  Educated regarding steps to lower blood pressure, reviewed foods that have less sodium. Past Medical History: Past Medical History:  Diagnosis Date   Alcohol-induced chronic pancreatitis ([place]) 07/22/2013   Anemia    Diabetes mellitus    Hypertension    Pancreatitis    Substance use disorder 02/18/2019    Encounter Details:  CNP Questionnaire - 09/14/22 1243       Questionnaire   Ask client: Do you give verbal consent for me to treat you today? Yes    Student Assistance [employer] Nurse    Location Patient [facility]    Visit Setting with Client Organization    Patient Status Unknown   Has own apartment, no longer living at [facility].   Insurance Medicaid    Insurance/Financial Assistance Referral N/A    Medication N/A    Medical Provider Yes    Screening Referrals Made N/A    Medical Referrals Made N/A    Medical Appointment Made N/A    Recently w/o PCP, now 1st time PCP visit completed due to CNs referral or appointment made N/A    Food N/A    Transportation Need transportation assistance    Housing/Utilities N/A    Interpersonal Safety N/A    Interventions Advocate/Support;Counsel;Reviewed Medications;Educate    Abnormal to Normal Screening Since Last CN Visit N/A    Screenings CN Performed N/A    Sent Client to Lab for: N/A    Did client attend any of the following based off CNs referral or appointments made? N/A    ED Visit Averted N/A    Life-Saving Intervention Made N/A

## 2022-09-28 ENCOUNTER — Ambulatory Visit: Payer: Self-pay | Admitting: *Deleted

## 2022-09-28 NOTE — Congregational Nurse Program (Signed)
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   Insurance Medicaid    Insurance/Financial Assistance Referral N/A    Medication N/A    Medical Provider Yes    Screening Referrals Made N/A    Medical Referrals [facility]    Medical Appointment Made [facility]    Recently w/o PCP, now 1st time PCP visit completed due to CNs referral or appointment made N/A    Food N/A    Transportation Need transportation assistance    Housing/Utilities N/A    Interpersonal Safety N/A    Interventions Advocate/Support;Counsel;Reviewed Medications;Educate;Navigate Healthcare System;Case Management    Abnormal to Normal Screening Since Last CN Visit N/A    Screenings CN Performed Blood Pressure;Pulse Ox    Sent Client to Lab for: N/A    Did client attend any of the following based off CNs referral or appointments made? N/A    ED Visit Averted N/A    Life-Saving Intervention Made N/A               Dept: [phone]   Congregational Nurse Program Note  Date of Encounter: 09/28/2022  Clinic visit to assist with medications.  Complaint of lack of sleep last night due to disturbances at the apartment complex.  Average blood pressure 105/53 with a drop to 90/51 when standing up.  Denies headache, dizziness, nausea or any other symptoms.  Called MD office, he is to  [facility] urgent care as no appointment available this week.  Educated on symptoms that would require going to the emergency room. 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   Insurance Medicaid    Insurance/Financial Assistance Referral N/A    Medication N/A    Medical Provider Yes    Screening Referrals Made N/A    Medical Referrals [facility]    Medical Appointment Made [facility]    Recently w/o PCP, now 1st time PCP visit completed due to CNs referral or appointment made N/A    Food N/A    Transportation Need transportation assistance    Housing/Utilities N/A    Interpersonal Safety N/A    Interventions Advocate/Support;Counsel;Reviewed Medications;Educate;Navigate Healthcare System;Case Management  Abnormal to Normal Screening Since Last CN Visit N/A    Screenings CN Performed Blood Pressure;Pulse Ox    Sent Client to Lab for: N/A    Did client attend any of the following based off CNs referral or appointments made? N/A    ED Visit Averted N/A    Life-Saving Intervention Made N/A

## 2022-09-28 NOTE — Telephone Encounter (Signed)
  Chief Complaint: low BP reading/low pulse- 58 Symptoms: no symptoms- low BP read- average 105/53, 90/51 standing Frequency: checked today Pertinent Negatives: Patient denies dizziness, weakness, patient is tired- but was up late last night- police were called to where he lives Disposition: []$ ED /[x]$ Urgent Care (no appt availability in office) / []$ Appointment(In office/virtual)/ []$  [facility]/ []$ Home Care/ []$ Refused Recommended Disposition /[]$ [place] [facility]/ []$  Follow-up with PCP Additional Notes: Congregational nurse is calling to report patient has very low BP-  contacted office to see if patient could be seen withi 24 hour period but no open appointment- did offer Saturday- but not knowing the cause of low BP- patient advised UC for evaluation. He is going to hold BP medications until seen tomorrow. Reason for Disposition  [id number] Systolic BP [id number] AND [id number] taking blood pressure medications AND [3] NOT dizzy, lightheaded or weak  Answer Assessment - Initial Assessment Questions 1. BLOOD PRESSURE: "What is the blood pressure?" "Did you take at least two measurements 5 minutes apart?"     105/53- average, 90/51 2. ONSET: "When did you take your blood pressure?"     3:15 3. HOW: "How did you obtain the blood pressure?" (e.g., visiting nurse, automatic home BP monitor)     automatic 4. HISTORY: "Do you have a history of low blood pressure?" "What is your blood pressure normally?"     Yes- all medication taken 5. MEDICINES: "Are you taking any medications for blood pressure?" If Yes, ask: "Have they been changed recently?"     Yes- patient is compliant with all medications  6. PULSE RATE: "Do you know what your pulse rate is?"      58 7. OTHER SYMPTOMS: "Have you been sick recently?" "Have you had a recent injury?" no  Protocols used: Blood Pressure - Low-A-AH

## 2022-09-29 ENCOUNTER — Ambulatory Visit (HOSPITAL_COMMUNITY)
Admission: EM | Admit: 2022-09-29 | Discharge: 2022-09-29 | Discharge status: Absent without leave | Payer: Medicaid Other

## 2022-09-29 NOTE — ED Notes (Signed)
Patient states he had blood work done yesterday at a doctor's office and was told some part of his blood count was low. Patient unable to tell me what levels they said were low. Could not see in Epic where any blood work was done.   Patient states wanting his blood checked and some sort of treatment for his "low blood" whether it be a transfusion or supplements. Informed the Patient that our blood work results are not back immediately and that we are unable to do vitamin infusions or blood transfusions at the urgent care.   Patient states he is going to head down to the emergency room.

## 2022-10-05 ENCOUNTER — Ambulatory Visit: Payer: Self-pay

## 2022-10-05 NOTE — Telephone Encounter (Signed)
I recommend he take two carvedilol twice a day and two valsartan daily  Then add him on tuesday. Ok to double book  Doubt he needs mobile today

## 2022-10-05 NOTE — Telephone Encounter (Signed)
FYI

## 2022-10-05 NOTE — Telephone Encounter (Signed)
  Chief Complaint: Elevated BP  Symptoms: None Frequency: Unsure  - at least today Pertinent Negatives: Patient denies Any neuro s/s Disposition: []$ ED /[]$ Urgent Care (no appt availability in office) / []$ Appointment(In office/virtual)/ []$  [facility]/ []$ Home Care/ []$ Refused Recommended Disposition /[x]$ [place] [facility]/ []$  Follow-up with PCP Additional Notes: Received call from [place], Lyman Bishop visiting at [facility].   Juliann Pulse reports that pt has a BP of 187/80. Bp was taken several times. Pt missed his BP medications last night, but did take his morning dose.  Per FC no appts availble in office. Pt should go to mobile unit.  Nurse Juliann Pulse is requesting an appt for pt next week. Pt has an appt scheduled for 11/08/2022. Placed on wait list.    Reason for Disposition  Systolic BP  >= 99991111 OR Diastolic >= A999333  Answer Assessment - Initial Assessment Questions 1. BLOOD PRESSURE: "What is the blood pressure?" "Did you take at least two measurements 5 minutes apart?"     187/80 Taken 3 times 2. ONSET: "When did you take your blood pressure?"     30 minutes ago 3. HOW: "How did you take your blood pressure?" (e.g., automatic home BP monitor, visiting nurse)     Automatic 4. HISTORY: "Do you have a history of high blood pressure?"     Does have a hx 5. MEDICINES: "Are you taking any medicines for blood pressure?" "Have you missed any doses recently?"     Took morning meds, but not last night 6. OTHER SYMPTOMS: "Do you have any symptoms?" (e.g., blurred vision, chest pain, difficulty breathing, headache, weakness)     NO  Protocols used: Blood Pressure - High-A-AH

## 2022-10-05 NOTE — Congregational Nurse Program (Signed)
  Dept: [phone]   Congregational Nurse Program Note  Date of Encounter: 10/05/2022  Clinic visit for assistance with medications and for follow-up after Urgent Care visit for low blood pressure last visit.  BP 187/80, pulse 68, O2 Sat 97%.  Blood glucose 277, has not eaten or had anything to drink since breakfast. Did not take insulin this am.  MD office notified of BP and blood glucose levels and he is to be called when can be seen. Given water to drink, he is to drink water when he goes home and check blood glucose. 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   Insurance Medicaid    Insurance/Financial Assistance Referral N/A    Medication N/A    Medical Provider Yes    Screening Referrals Made N/A    Medical Referrals [facility]    Medical Appointment Made [facility]    Recently w/o PCP, now 1st time PCP visit completed due to CNs referral or appointment made N/A    Food N/A    Transportation Need transportation assistance;Provided transportation assistance    Housing/Utilities N/A    Interpersonal Safety N/A    Interventions Advocate/Support;Counsel;Reviewed Medications;Educate;Navigate Healthcare System;Case Management    Abnormal to Normal Screening Since Last CN Visit N/A    Screenings CN Performed Blood Pressure;Pulse Ox;Temperature    Sent Client to Lab for: N/A    Did client attend any of the following based off CNs referral or  appointments made? N/A    ED Visit Averted N/A    Life-Saving Intervention Made N/A

## 2022-10-06 NOTE — Telephone Encounter (Signed)
Called and left voicemail for patient.

## 2022-10-12 ENCOUNTER — Other Ambulatory Visit: Payer: Self-pay

## 2022-10-12 NOTE — Congregational Nurse Program (Signed)
  Dept: [phone]   Congregational Nurse Program Note  Date of Encounter: 10/12/2022  Client visit today Apical pulse 58, BP 133/67, Spo2 97 %, client reported he checked his blood sugar 145 after breakfast. No nausea, dizziness, blurred vision, no chest pain, no need to take nitroglycerin. Client requested bus passes. Bus passes given per request.  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 Yes    Student Assistance N/A    Location Patient [facility]    Visit Setting with Client Organization    Patient Status Unknown   Has own apartment, no longer living at [employer].   Insurance Medicaid    Insurance/Financial Assistance Referral N/A    Medication N/A    Medical Provider Yes    Screening Referrals Made N/A    Medical Referrals Made N/A    Medical Appointment Made N/A    Recently w/o PCP, now 1st time PCP visit completed due to CNs referral or appointment made N/A    Food N/A    Transportation Need transportation assistance;Provided transportation assistance    Housing/Utilities N/A    Interpersonal Safety N/A    Interventions Advocate/Support;Counsel;Reviewed Medications;Educate;Case Management    Abnormal to Normal Screening Since Last CN Visit Blood Pressure    Screenings CN Performed Blood Pressure;Pulse Ox    Sent Client to Lab for: N/A    Did client attend any of the following based off CNs referral or appointments made? N/A    ED Visit Averted N/A    Life-Saving Intervention Made N/A

## 2022-10-16 ENCOUNTER — Other Ambulatory Visit: Payer: Self-pay

## 2022-10-19 NOTE — Congregational Nurse Program (Signed)
  Dept: [phone]   Congregational Nurse Program Note  Date of Encounter: 10/19/2022  Client vital signs BP: 129/53, radial pulse manually 64, Sp02 93 %. Client medications reviewed, client verbalized understanding of meds and aware next MD appointment is 11-08-22.  Bus passes provided.   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 Yes    Student Assistance N/A    Location Patient [facility]    Visit Setting with Client Organization    Patient Status Unknown   Has own apartment, no longer living at [facility].   Insurance Medicaid    Insurance/Financial Assistance Referral N/A    Medication N/A    Medical Provider Yes    Screening Referrals Made N/A    Medical Referrals Made N/A    Medical Appointment Made N/A    Recently w/o PCP, now 1st time PCP visit completed due to CNs referral or appointment made N/A    Food N/A    Transportation Need transportation assistance;Provided transportation assistance    Housing/Utilities N/A    Interpersonal Safety N/A    Interventions Advocate/Support;Counsel;Reviewed Medications;Educate;Case Management    Abnormal to Normal Screening Since Last CN Visit Blood Pressure    Screenings CN Performed Blood Pressure;Pulse Ox    Sent Client to Lab for: N/A    Did client attend any of the following based off CNs referral or appointments made? N/A    ED Visit Averted N/A    Life-Saving Intervention Made N/A

## 2022-10-26 ENCOUNTER — Other Ambulatory Visit: Payer: Self-pay

## 2022-10-26 NOTE — Congregational Nurse Program (Signed)
  Dept: [phone]   Congregational Nurse Program Note  Date of Encounter: 10/26/2022  Clinic visit to check blood pressure and to assist with filling weekly pillbox. Blood pressure 128/65, pulse 60 and regular.  Reviewed al of medications, side effects to watch for and was able to fill pillbox with minimal assistance.  States that blood glucose checks in am have been in the 130 - 140 range.  Educated regarding normal range for blood glucose levels and carbohydrate intake to manage blood glucose levels. 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 Yes    Student Assistance N/A    Location Patient [facility]    Visit Setting with Client Organization    Patient Status Unknown   Has own apartment, no longer living at [facility].   Insurance Medicaid    Insurance/Financial Assistance Referral N/A    Medication N/A    Medical Provider Yes    Screening Referrals Made N/A    Medical Referrals Made N/A    Medical Appointment Made N/A    Recently w/o PCP, now 1st time PCP visit completed due to CNs referral or appointment made N/A    Food N/A    Transportation Need transportation assistance;Provided transportation assistance    Housing/Utilities N/A    Interpersonal Safety N/A    Interventions Advocate/Support;Counsel;Reviewed Medications;Educate;Case Management    Abnormal to Normal Screening Since Last CN Visit Blood Pressure    Screenings CN Performed Blood Pressure;Pulse Ox    Sent Client to Lab for: N/A    Did client attend any of the following based off CNs referral or appointments made? N/A    ED Visit Averted N/A    Life-Saving Intervention  Made N/A

## 2022-11-08 ENCOUNTER — Other Ambulatory Visit: Payer: Self-pay

## 2022-11-08 ENCOUNTER — Encounter: Payer: Self-pay | Admitting: Critical Care Medicine

## 2022-11-08 ENCOUNTER — Ambulatory Visit: Payer: Medicaid Other | Attending: Critical Care Medicine | Admitting: Critical Care Medicine

## 2022-11-08 VITALS — BP 135/60 | HR 61 | Wt 131.6 lb

## 2022-11-08 DIAGNOSIS — Z7984 Long term (current) use of oral hypoglycemic drugs: Secondary | ICD-10-CM | POA: Diagnosis not present

## 2022-11-08 DIAGNOSIS — Z79899 Other long term (current) drug therapy: Secondary | ICD-10-CM | POA: Insufficient documentation

## 2022-11-08 DIAGNOSIS — E1165 Type 2 diabetes mellitus with hyperglycemia: Secondary | ICD-10-CM | POA: Diagnosis not present

## 2022-11-08 DIAGNOSIS — Z720 Tobacco use: Secondary | ICD-10-CM

## 2022-11-08 DIAGNOSIS — Z794 Long term (current) use of insulin: Secondary | ICD-10-CM

## 2022-11-08 DIAGNOSIS — I739 Peripheral vascular disease, unspecified: Secondary | ICD-10-CM

## 2022-11-08 DIAGNOSIS — I5042 Chronic combined systolic (congestive) and diastolic (congestive) heart failure: Secondary | ICD-10-CM | POA: Diagnosis not present

## 2022-11-08 DIAGNOSIS — E119 Type 2 diabetes mellitus without complications: Secondary | ICD-10-CM | POA: Diagnosis present

## 2022-11-08 DIAGNOSIS — F17210 Nicotine dependence, cigarettes, uncomplicated: Secondary | ICD-10-CM | POA: Insufficient documentation

## 2022-11-08 DIAGNOSIS — E1136 Type 2 diabetes mellitus with diabetic cataract: Secondary | ICD-10-CM | POA: Diagnosis not present

## 2022-11-08 DIAGNOSIS — E785 Hyperlipidemia, unspecified: Secondary | ICD-10-CM | POA: Diagnosis not present

## 2022-11-08 DIAGNOSIS — E1142 Type 2 diabetes mellitus with diabetic polyneuropathy: Secondary | ICD-10-CM

## 2022-11-08 DIAGNOSIS — I10 Essential (primary) hypertension: Secondary | ICD-10-CM

## 2022-11-08 DIAGNOSIS — I2510 Atherosclerotic heart disease of native coronary artery without angina pectoris: Secondary | ICD-10-CM

## 2022-11-08 DIAGNOSIS — E1169 Type 2 diabetes mellitus with other specified complication: Secondary | ICD-10-CM

## 2022-11-08 DIAGNOSIS — N179 Acute kidney failure, unspecified: Secondary | ICD-10-CM | POA: Diagnosis not present

## 2022-11-08 DIAGNOSIS — I110 Hypertensive heart disease with heart failure: Secondary | ICD-10-CM | POA: Diagnosis not present

## 2022-11-08 DIAGNOSIS — I5043 Acute on chronic combined systolic (congestive) and diastolic (congestive) heart failure: Secondary | ICD-10-CM

## 2022-11-08 LAB — POCT GLYCOSYLATED HEMOGLOBIN (HGB A1C): HbA1c, POC (controlled diabetic range): 8.9 % — AB (ref 0.0–7.0)

## 2022-11-08 LAB — GLUCOSE, POCT (MANUAL RESULT ENTRY): POC Glucose: 336 mg/dl — AB (ref 70–99)

## 2022-11-08 MED ORDER — FERROUS SULFATE 325 (65 FE) MG PO TABS
325.0000 mg | ORAL_TABLET | Freq: Two times a day (BID) | ORAL | 3 refills | Status: AC
  Filled 2022-11-08: qty 60, 30d supply, fill #0

## 2022-11-08 MED ORDER — ACCU-CHEK GUIDE VI STRP
ORAL_STRIP | 12 refills | Status: AC
Start: 2022-11-08 — End: ?
  Filled 2022-11-08: qty 100, 25d supply, fill #0

## 2022-11-08 MED ORDER — GABAPENTIN 100 MG PO CAPS
100.0000 mg | ORAL_CAPSULE | Freq: Two times a day (BID) | ORAL | 2 refills | Status: DC
  Filled 2022-11-08 – 2022-11-13 (×2): qty 90, 45d supply, fill #0
  Filled 2023-01-03: qty 90, 45d supply, fill #1
  Filled 2023-03-13: qty 90, 45d supply, fill #2

## 2022-11-08 MED ORDER — ACCU-CHEK SOFTCLIX LANCETS MISC
Freq: Four times a day (QID) | 2 refills | Status: AC
  Filled 2022-11-08: qty 100, 25d supply, fill #0

## 2022-11-08 MED ORDER — ROSUVASTATIN CALCIUM 40 MG PO TABS
40.0000 mg | ORAL_TABLET | Freq: Every day | ORAL | 3 refills | Status: AC
  Filled 2022-11-08 – 2022-12-26 (×2): qty 90, 90d supply, fill #0
  Filled 2023-04-20 (×2): qty 90, 90d supply, fill #1
  Filled 2023-08-15: qty 90, 90d supply, fill #2

## 2022-11-08 MED ORDER — INSULIN LISPRO (1 UNIT DIAL) 100 UNIT/ML (KWIKPEN)
5.0000 [IU] | PEN_INJECTOR | Freq: Two times a day (BID) | SUBCUTANEOUS | 1 refills | Status: DC
  Filled 2022-11-08: qty 3, 28d supply, fill #0
  Filled 2022-12-25: qty 3, 30d supply, fill #0
  Filled 2023-01-29: qty 3, 30d supply, fill #1

## 2022-11-08 MED ORDER — PANTOPRAZOLE SODIUM 40 MG PO TBEC
40.0000 mg | DELAYED_RELEASE_TABLET | Freq: Every day | ORAL | 3 refills | Status: AC
  Filled 2022-11-08 – 2022-11-13 (×2): qty 90, 90d supply, fill #0
  Filled 2023-02-23: qty 90, 90d supply, fill #1
  Filled 2023-07-20 – 2023-08-15 (×2): qty 90, 90d supply, fill #2

## 2022-11-08 MED ORDER — INSULIN PEN NEEDLE 31G X 5 MM MISC
6 refills | Status: AC
  Filled 2022-11-08: qty 100, 25d supply, fill #0

## 2022-11-08 MED ORDER — VALSARTAN 40 MG PO TABS
40.0000 mg | ORAL_TABLET | Freq: Every day | ORAL | 2 refills | Status: DC
  Filled 2022-11-08: qty 90, 90d supply, fill #0

## 2022-11-08 MED ORDER — SPIRONOLACTONE 25 MG PO TABS
25.0000 mg | ORAL_TABLET | Freq: Every day | ORAL | 3 refills | Status: AC
  Filled 2022-11-08 – 2023-01-29 (×2): qty 90, 90d supply, fill #0
  Filled 2023-06-04: qty 90, 90d supply, fill #1

## 2022-11-08 MED ORDER — INSULIN GLARGINE 100 UNIT/ML SOLOSTAR PEN
25.0000 [IU] | PEN_INJECTOR | Freq: Every day | SUBCUTANEOUS | 2 refills | Status: DC
  Filled 2022-11-08: qty 21, 84d supply, fill #0
  Filled 2023-03-12 (×2): qty 21, 84d supply, fill #1

## 2022-11-08 MED ORDER — ISOSORBIDE MONONITRATE ER 30 MG PO TB24
30.0000 mg | ORAL_TABLET | Freq: Every day | ORAL | 3 refills | Status: AC
  Filled 2022-11-08 – 2022-12-15 (×2): qty 90, 90d supply, fill #0
  Filled 2023-04-20: qty 90, 90d supply, fill #1
  Filled 2023-08-20: qty 90, 90d supply, fill #2

## 2022-11-08 MED ORDER — CARVEDILOL 12.5 MG PO TABS
12.5000 mg | ORAL_TABLET | Freq: Two times a day (BID) | ORAL | 6 refills | Status: AC
  Filled 2022-11-08 – 2023-01-03 (×2): qty 120, 60d supply, fill #0
  Filled 2023-03-13: qty 120, 60d supply, fill #1
  Filled 2023-05-21: qty 120, 60d supply, fill #2
  Filled 2023-08-15: qty 120, 60d supply, fill #3

## 2022-11-08 NOTE — Patient Instructions (Addendum)
Refills on medications sent Increase insulin glargine to 25 units daily No other medication changes Return Dr Joya Gaskins 2 months Dr Joya Gaskins will cardiology to see you again

## 2022-11-08 NOTE — Progress Notes (Unsigned)
Established Patient Office Visit  Subjective   Patient ID: Rodney Barber, male    DOB: 10-16-1965  Age: 57 y.o. MRN: [id number]  Chief Complaint  Patient presents with   Diabetes   Medication Refill    Lantus and valsartan     09/2021 [name] presents for primary care follow-up.  Patient is currently at the [facility] but is about to get housing and now has full Medicaid and Social Security disability.  Patient on arrival has blood pressure 110/54 blood sugar of 96.  He is taking 25 units of insulin glargine in the morning and variably takes 5 units of Humalog lispro in the morning and occasionally a dose at lunch.  He states his blood sugars are in the low 100s fasting midmorning 175 range and in the late afternoon he does get over 200 on occasion.  On arrival hemoglobin A1c is 9.1 which is still markedly better than previous values of greater than 14 in the past.  He has no other real complaints at this visit.   The patient needs screening labs at this visit he is still smoking some cigarettes.   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  Patient needs refills on iron carvedilol and Creon  12/27   On arrival blood pressure is good 117/62 blood sugar 138 he states blood sugars at home anywhere from 1 60-1 80.  Patient is on 25 units of Lantus daily and takes the 5 units twice daily of Humalog  Patient now has housing and is awaiting his furniture that arrives this week.  He has no other complaints.  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 He is on Creon  pancreatic enzyme replacement.  At the previous visit, he complained of left claudication. ABI was abnormal. He was referred to vascular surgery and has an upcoming appointment scheduled 03/30/22.   At the previous visit, we discontinued iron supplement as his levels were normalized. He reports he thinks he is still taking them.    Today's Blood pressure is 175/80. Blood sugar = 356. He did not take his medications today as he was rushing to get out the door. Normally he is compliant with his medications. He does take his sugars at home and reports they normally range from 160's-180's. He does not take blood pressures at home as he does not have a machine. He is interested in obtaining one.   Reports some light headed feelings occasionally in the morning. This is normally alleviated with a meal. He does believe he has lost about 10 pounds in the past 3 months   He is also requesting a referral to opthalmology, reports he hasn't seen them in years. Was previously diagnosed with cataracts   Lastly, he previously was following with urology regarding a left kidney stone. He has not seen them yet and is requesting another referral.   He was able to obtain furniture for his apartment! Although he does report a "nat" issue. Reports some itching of his forearms and neck intermittently. Also notices a rash at the same time in those areas. He believes this may be eczema related.  He is still smoking cigarettes, reports about 3 daily. No alcohol or recreational drug use.   08/02/22 Patient seen in return follow-up for diabetes cardiovascular disease hypertension On arrival blood pressure is slightly elevated and patient is compliant with all medication.  Blood sugars largely improved he has occasional elevations in the afternoon.  He does have dental care.  He did except a flu vaccine at this visit.  He has had no episodes of hypoglycemia.  He is getting his pill organization done by one of the  [employer] nurses at [facility].  There is no new complaints  11/08/22 Patient seen today in return follow-up he is now in an independent living trying to manage his lifestyle he is now filling his own pill organizer taking his insulin.  His blood sugar tends to run in the 140s in the afternoon but much higher in the mornings.  On arrival his A1c remains elevated 8.9 and diabetes is uncontrolled.  He is not drinking alcohol he is smoking 3 cigarettes a day.  He is yet to see cardiology recently.  Blood pressure on arrival is good 135/60.  He needs follow-up labs and refill medications.    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   Anemia    Diabetes mellitus    Hypertension    Pancreatitis    Substance use disorder 02/18/2019   Past Surgical History:  Procedure Laterality Date   ERCP  06/27/2011   Procedure: ENDOSCOPIC RETROGRADE CHOLANGIOPANCREATOGRAPHY (ERCP);  Surgeon: Jeryl Columbia, MD;  Location: [facility];  Service: Endoscopy;  Laterality: N/A;   ERCP W/ PLASTIC STENT PLACEMENT  03/2011   IR US GUIDE BX ASP/DRAIN  05/16/2019   LEFT HEART CATH AND CORONARY ANGIOGRAPHY N/A 01/02/2020   Procedure: LEFT HEART CATH AND CORONARY ANGIOGRAPHY;  Surgeon: Martinique, Peter M, MD;  Location: [facility];  Service: Cardiovascular;  Laterality: N/A;   TEE WITHOUT CARDIOVERSION N/A 05/20/2019   Procedure: TRANSESOPHAGEAL ECHOCARDIOGRAM (TEE);   Surgeon: [name], MD;  Location: [facility];  Service: Cardiovascular;  Laterality: N/A;   Social History   Tobacco Use   Smoking status: Every Day    Packs/day: 0.50    Years: 30.00    Additional pack years: 0.00    Total pack years: 15.00    Types: Cigarettes   Smokeless tobacco: Never   Tobacco comments:    6 cigarett /day  Vaping Use   Vaping Use: Never used  Substance Use Topics   Alcohol use: No   Drug use: Not Currently    Types: "Crack" cocaine, Other-see comments, Cocaine    Comment: last smoked crack 12-16-16   Family History  Problem Relation Age of Onset   Diabetes Mother    Diabetes Brother    No Known Allergies    Review of Systems  Constitutional:  Negative for chills, diaphoresis, fever, malaise/fatigue and weight loss.  HENT: Negative.  Negative for congestion, hearing loss, nosebleeds, sore throat and  tinnitus.   Eyes: Negative.  Negative for blurred vision, photophobia and redness.  Respiratory: Negative.  Negative for cough, hemoptysis, sputum production, shortness of breath, wheezing and stridor.   Cardiovascular:  Negative for chest pain, palpitations, orthopnea, claudication, leg swelling and PND.  Gastrointestinal: Negative.  Negative for abdominal pain, blood in stool, constipation, diarrhea, heartburn, nausea and vomiting.  Genitourinary: Negative.  Negative for dysuria, flank pain, frequency, hematuria and urgency.  Musculoskeletal:  Negative for back pain, falls, joint pain, myalgias and neck pain.  Skin:  Negative for itching and rash.  Neurological:  Negative for dizziness, tingling, tremors, sensory change, speech change, focal weakness, seizures, loss of consciousness, weakness and headaches.  Endo/Heme/Allergies: Negative.  Negative for environmental allergies and polydipsia. Does not bruise/bleed easily.  Psychiatric/Behavioral: Negative.  Negative for depression, memory loss, substance abuse and suicidal ideas. The patient is not  nervous/anxious and does not have insomnia.       Objective:     BP 135/60   Pulse 61   Wt 131 lb 9.6 oz (59.7 kg)   SpO2 100%   BMI 20.01 kg/m     Physical Exam HENT:     Head: Normocephalic.     Right Ear: Tympanic membrane, ear canal and external ear normal.     Left Ear: Tympanic membrane, ear canal and external ear normal.     Nose: Nose normal.     Mouth/Throat:     Mouth: Mucous membranes are moist.     Pharynx: Oropharynx is clear.     Comments: Poor dentition seeking dental care Eyes:     Conjunctiva/sclera: Conjunctivae normal.  Cardiovascular:     Rate and Rhythm: Normal rate and regular rhythm.     Pulses: Normal pulses.     Heart sounds: Normal heart sounds.  Pulmonary:     Effort: Pulmonary effort is normal.     Breath sounds: Normal breath sounds.  Abdominal:     General: Bowel sounds are normal.     Palpations: Abdomen is soft.  Musculoskeletal:     Comments: Foot exam normal  Skin:    General: Skin is warm.  Neurological:     Mental Status: He is alert and oriented to person, place, and time. Mental status is at baseline.     Comments: Ambulates with cane   Psychiatric:        Mood and Affect: Mood normal.        Behavior: Behavior normal.      Results for orders placed or performed in visit on 11/08/22  POCT glucose (manual entry)  Result Value Ref Range   POC Glucose 336 (A) 70 - 99 mg/dl  POCT glycosylated hemoglobin (Hb A1C)  Result Value Ref Range   Hemoglobin A1C     HbA1c POC (<> result, manual entry)     HbA1c, POC (prediabetic range)     HbA1c, POC (controlled diabetic range) 8.9 (A) 0.0 - 7.0 %       The ASCVD Risk score (Arnett DK, et al., 2019) failed to calculate for the following reasons:   The valid total cholesterol range is 130 to 320 mg/dL    Assessment & Plan:   Problem List Items Addressed This Visit       Cardiovascular and Mediastinum   Chronic combined systolic (congestive) and diastolic (congestive)  heart failure    Compensated heart failure no changes referral back to cardiology for follow-up      Relevant Medications   carvedilol (COREG)  12.5 MG tablet   isosorbide mononitrate (IMDUR) 30 MG 24 hr tablet   rosuvastatin (CRESTOR) 40 MG tablet   spironolactone (ALDACTONE) 25 MG tablet   valsartan (DIOVAN) 40 MG tablet   Other Relevant Orders   Ambulatory referral to Cardiology   Essential hypertension    Hypertension well-controlled no changes      Relevant Medications   carvedilol (COREG) 12.5 MG tablet   isosorbide mononitrate (IMDUR) 30 MG 24 hr tablet   rosuvastatin (CRESTOR) 40 MG tablet   spironolactone (ALDACTONE) 25 MG tablet   valsartan (DIOVAN) 40 MG tablet   Coronary artery disease   Relevant Medications   carvedilol (COREG) 12.5 MG tablet   isosorbide mononitrate (IMDUR) 30 MG 24 hr tablet   rosuvastatin (CRESTOR) 40 MG tablet   spironolactone (ALDACTONE) 25 MG tablet   valsartan (DIOVAN) 40 MG tablet   Other Relevant Orders   Ambulatory referral to Cardiology   PAD (peripheral artery disease)    Stable       Relevant Medications   carvedilol (COREG) 12.5 MG tablet   isosorbide mononitrate (IMDUR) 30 MG 24 hr tablet   rosuvastatin (CRESTOR) 40 MG tablet   spironolactone (ALDACTONE) 25 MG tablet   valsartan (DIOVAN) 40 MG tablet     Endocrine   Hyperlipidemia associated with type 2 diabetes mellitus   Relevant Medications   carvedilol (COREG) 12.5 MG tablet   insulin glargine (LANTUS) 100 UNIT/ML Solostar Pen   insulin lispro (HUMALOG) 100 UNIT/ML KwikPen   isosorbide mononitrate (IMDUR) 30 MG 24 hr tablet   rosuvastatin (CRESTOR) 40 MG tablet   spironolactone (ALDACTONE) 25 MG tablet   valsartan (DIOVAN) 40 MG tablet   Other Relevant Orders   POCT glucose (manual entry) (Completed)   POCT glycosylated hemoglobin (Hb A1C) (Completed)   Lipid panel   Type 2 diabetes mellitus    Currently uncontrolled will increase doses of insulin get the patient  back into see clinical pharmacy check labs      Relevant Medications   Accu-Chek Softclix Lancets lancets   glucose blood (ACCU-CHEK GUIDE) test strip   insulin glargine (LANTUS) 100 UNIT/ML Solostar Pen   insulin lispro (HUMALOG) 100 UNIT/ML KwikPen   rosuvastatin (CRESTOR) 40 MG tablet   valsartan (DIOVAN) 40 MG tablet     Genitourinary   AKI (acute kidney injury)    Reassess metabolic panel        Other   Tobacco abuse       Current smoking consumption amount: 4 cigarettes daily  Dicsussion on advise to quit smoking and smoking impacts: Impact on cardiovascular health  Patient's willingness to quit: Willing to cut back but not quit  Methods to quit smoking discussed: Behavioral modification cannot use nicotine replacement therapy due to vascular disease  Medication management of smoking session drugs discussed: Not a candidate for medication management Chantix is on backorder  Resources provided:  AVS   Setting quit date not yet established  Follow-up arranged 2 months    Time spent counseling the patient: 5 minutes       Other Visit Diagnoses     Uncontrolled type 2 diabetes mellitus with hyperglycemia    -  Primary   Relevant Medications   Accu-Chek Softclix Lancets lancets   glucose blood (ACCU-CHEK GUIDE) test strip   insulin glargine (LANTUS) 100 UNIT/ML Solostar Pen   insulin lispro (HUMALOG) 100 UNIT/ML KwikPen   rosuvastatin (CRESTOR) 40 MG tablet   valsartan (DIOVAN) 40 MG tablet   Other  Relevant Orders   Comprehensive metabolic panel   CBC with Differential/Platelet   Acute on chronic combined systolic and diastolic CHF (congestive heart failure)   (Chronic)     Relevant Medications   carvedilol (COREG) 12.5 MG tablet   isosorbide mononitrate (IMDUR) 30 MG 24 hr tablet   rosuvastatin (CRESTOR) 40 MG tablet   spironolactone (ALDACTONE) 25 MG tablet   valsartan (DIOVAN) 40 MG tablet     No follow-ups on file.  30 minutes spent extra time  needed for complexity decision making  Asencion Noble, MD

## 2022-11-08 NOTE — Assessment & Plan Note (Signed)
Stable

## 2022-11-09 LAB — LIPID PANEL
Chol/HDL Ratio: 2.3 ratio (ref 0.0–5.0)
Cholesterol, Total: 98 mg/dL — ABNORMAL LOW (ref 100–199)
HDL: 43 mg/dL (ref >39)
LDL Chol Calc (NIH): 37 mg/dL (ref 0–99)
Triglycerides: 91 mg/dL (ref 0–149)
VLDL Cholesterol Cal: 18 mg/dL (ref 5–40)

## 2022-11-09 LAB — COMPREHENSIVE METABOLIC PANEL
ALT: 58 IU/L — ABNORMAL HIGH (ref 0–44)
AST: 86 IU/L — ABNORMAL HIGH (ref 0–40)
Albumin/Globulin Ratio: 1.7 (ref 1.2–2.2)
Albumin: 3.5 g/dL — ABNORMAL LOW (ref 3.8–4.9)
Alkaline Phosphatase: 117 IU/L (ref 44–121)
BUN/Creatinine Ratio: 9 (ref 9–20)
BUN: 14 mg/dL (ref 6–24)
Bilirubin Total: 0.2 mg/dL (ref 0.0–1.2)
CO2: 22 mmol/L (ref 20–29)
Calcium: 7.8 mg/dL — ABNORMAL LOW (ref 8.7–10.2)
Chloride: 108 mmol/L — ABNORMAL HIGH (ref 96–106)
Creatinine, Ser: 1.54 mg/dL — ABNORMAL HIGH (ref 0.76–1.27)
Globulin, Total: 2.1 g/dL (ref 1.5–4.5)
Glucose: 279 mg/dL — ABNORMAL HIGH (ref 70–99)
Potassium: 3.7 mmol/L (ref 3.5–5.2)
Sodium: 143 mmol/L (ref 134–144)
Total Protein: 5.6 g/dL — ABNORMAL LOW (ref 6.0–8.5)
eGFR: 52 mL/min/{1.73_m2} — ABNORMAL LOW (ref >59)

## 2022-11-09 LAB — CBC WITH DIFFERENTIAL/PLATELET
Basophils Absolute: 0.1 10*3/uL (ref 0.0–0.2)
Basos: 1 %
EOS (ABSOLUTE): 0.3 10*3/uL (ref 0.0–0.4)
Eos: 5 %
Hematocrit: 29.8 % — ABNORMAL LOW (ref 37.5–51.0)
Hemoglobin: 9.5 g/dL — ABNORMAL LOW (ref 13.0–17.7)
Immature Grans (Abs): 0 10*3/uL (ref 0.0–0.1)
Immature Granulocytes: 0 %
Lymphocytes Absolute: 1.6 10*3/uL (ref 0.7–3.1)
Lymphs: 25 %
MCH: 29.6 pg (ref 26.6–33.0)
MCHC: 31.9 g/dL (ref 31.5–35.7)
MCV: 93 fL (ref 79–97)
Monocytes Absolute: 0.5 10*3/uL (ref 0.1–0.9)
Monocytes: 7 %
Neutrophils Absolute: 4.1 10*3/uL (ref 1.4–7.0)
Neutrophils: 62 %
Platelets: 122 10*3/uL — ABNORMAL LOW (ref 150–450)
RBC: 3.21 x10E6/uL — ABNORMAL LOW (ref 4.14–5.80)
RDW: 13.6 % (ref 11.6–15.4)
WBC: 6.5 10*3/uL (ref 3.4–10.8)

## 2022-11-09 NOTE — Assessment & Plan Note (Signed)
Compensated heart failure no changes referral back to cardiology for follow-up

## 2022-11-09 NOTE — Assessment & Plan Note (Signed)
  .   Current smoking consumption amount: 4 cigarettes daily  . Dicsussion on advise to quit smoking and smoking impacts: Impact on cardiovascular health  . Patient's willingness to quit: Willing to cut back but not quit  . Methods to quit smoking discussed: Behavioral modification cannot use nicotine replacement therapy due to vascular disease  . Medication management of smoking session drugs discussed: Not a candidate for medication management Chantix is on backorder  . Resources provided:  AVS   . Setting quit date not yet established  . Follow-up arranged 2 months    Time spent counseling the patient: 5 minutes  

## 2022-11-09 NOTE — Assessment & Plan Note (Signed)
Reassess metabolic panel 

## 2022-11-09 NOTE — Assessment & Plan Note (Signed)
Hypertension well-controlled no changes 

## 2022-11-09 NOTE — Assessment & Plan Note (Signed)
Currently uncontrolled will increase doses of insulin get the patient back into see clinical pharmacy check labs

## 2022-11-10 ENCOUNTER — Other Ambulatory Visit: Payer: Self-pay

## 2022-11-13 ENCOUNTER — Other Ambulatory Visit: Payer: Self-pay

## 2022-11-14 ENCOUNTER — Other Ambulatory Visit: Payer: Self-pay

## 2022-11-16 ENCOUNTER — Encounter: Payer: Self-pay | Admitting: *Deleted

## 2022-12-03 NOTE — Progress Notes (Signed)
Cardiology Office Note:    Date:  12/13/2022   ID:  [name], DOB 06-Dec-1965, MRN [id number]  PCP:  [name], MD  Cardiologist:  [name], MD  Electrophysiologist:  None   Referring MD: [name], MD   Chief Complaint: CHF   History of Present Illness:    Rodney Barber is a 57 y.o. 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 Swaziland and presents today for evaluation of CHF.   Patient was admitted in 12/2019 for new acute systolic CHF. Echo at that time showed LVEF of 35-40% with global hypokinesis, normal RV, and no significant valvular disease. He underwent LHC which showed diffuse coronary atherosclerosis with heavy calcification and 70% stenosis of mid RCA. Degree of LV dysfunction was felt to be out of proportion to the degree of CAD. Medical therapy was recommended and patient was started on GDMT. EF later normalized to 60-65% on repeat Echo in 04/2021. He was last seen by [name], PA-C, in 06/2021 at which time he reported bilateral leg pain and well as cold feet and occasional numbness/tingling concerning for claudication. ABIs were ordered and were normal bilaterally.   Patient was admitted from 12/02/2021 to 12/06/2021 for acute hypoxic respiratory failure secondary to community acquired pneumonia and COPD. Echo during this admission showed LVEF of 40-45% with apical and septal mid/ apical inferior wall hypokinesis.  ABIs in 03/2022 were suggestive of mild arterial disease bilaterally (0.9 on the right and 0.82 on the left).   Patient was referred back to [place] today for further  evaluation of CHF. He is really doing well from a cardiac standpoint. He denies any chest pain recently (nothing within the last 1-2 years). No shortness of breath, orthopnea, or PND. He notes some swelling in his right knee but no other lower extremity edema. No palpitations. He describes some rare brief dizziness but nothing significant. No syncope. He does report cramping in his left leg when walking up an incline but no leg pain when walking on a flat surface. He states this leg cramping is manageable though.   His BP is markedly elevated today in the office. Initially 187/82 and then 178/80 on my personal recheck at the end of visit. He states he has not taken his BP medication today yet. He does not normally check his BP at home.    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   Anemia    Diabetes mellitus    Hypertension    Pancreatitis    Substance use disorder 02/18/2019    Past Surgical History:  Procedure Laterality Date   ERCP  06/27/2011   Procedure: ENDOSCOPIC RETROGRADE CHOLANGIOPANCREATOGRAPHY (ERCP);  Surgeon: Petra Kuba, MD;  Location: [facility];  Service: Endoscopy;  Laterality: N/A;   ERCP W/ PLASTIC STENT PLACEMENT  03/2011   IR US GUIDE BX ASP/DRAIN  05/16/2019   LEFT HEART CATH AND CORONARY ANGIOGRAPHY N/A 01/02/2020   Procedure: LEFT HEART CATH AND CORONARY ANGIOGRAPHY;  Surgeon: Swaziland, Peter M, MD;  Location: [facility] INVASIVE CV LAB;  Service: Cardiovascular;  Laterality: N/A;   TEE WITHOUT CARDIOVERSION N/A 05/20/2019   Procedure: TRANSESOPHAGEAL ECHOCARDIOGRAM (TEE);  Surgeon: Chrystie Nose, MD;  Location: [facility] ENDOSCOPY;  Service: Cardiovascular;  Laterality: N/A;    Current Medications: Current Meds  Medication Sig   Accu-Chek Softclix Lancets lancets Ues to check blood sugar 4 (four) times daily.   acetaminophen (TYLENOL) 325 MG tablet Take 2 tablets (650 mg total) by mouth every 6 (six) hours as needed for  mild pain.   aspirin 81 MG chewable tablet Chew 1 tablet (81 mg total) by mouth daily.   Blood Glucose Monitoring Suppl (ACCU-CHEK GUIDE) w/Device KIT Use to check blood sugar four times daily. E11.65   Blood Pressure Monitoring (BLOOD PRESSURE KIT) DEVI Use to measure blood pressure   dapagliflozin propanediol (FARXIGA) 10 MG TABS tablet Take 1 tablet (10 mg total) by mouth daily before breakfast.   feeding supplement, GLUCERNA SHAKE, (GLUCERNA SHAKE) LIQD Take 237 mLs by mouth 3 (three) times daily between meals.   ferrous sulfate (FEROSUL) 325 (65 FE) MG tablet Take 1 tablet (325 mg total) by mouth 2 (two) times daily with a meal.   gabapentin (NEURONTIN) 100 MG capsule Take 1 capsule (100 mg total) by mouth 2 (two) times daily.   Glucose 4-6 GM-MG CHEW Chew one as needed for blood glucose < 70 (Patient taking differently: Chew 1 tablet by mouth 2 (two) times daily as needed (low glucose). Chew one as needed for blood glucose < 70)   glucose blood (ACCU-CHEK GUIDE) test strip Use to check blood sugar four times daily.   insulin glargine (LANTUS) 100 UNIT/ML Solostar Pen Inject 25 Units into the skin daily.   insulin lispro (HUMALOG) 100 UNIT/ML KwikPen Inject 5 Units into the skin 2 (two) times daily before a meal. Skip your dose if you do not eat or if your sugar is <150.   Insulin Pen Needle 31G X 5 MM MISC USE WITH INSULIN PENS   isosorbide mononitrate (IMDUR) 30 MG 24 hr tablet Take 1 tablet (30 mg total) by mouth daily.   nicotine (NICODERM CQ - DOSED IN MG/24 HOURS) 14 mg/24hr patch Place 1 patch (14 mg total) onto the skin daily.   nicotine (NICODERM CQ - DOSED IN MG/24 HR) 7 mg/24hr patch Place 1 patch (7 mg total) onto the skin daily.   nitroGLYCERIN (NITROSTAT) 0.4 MG SL tablet Place 1 tablet (0.4 mg total) under the tongue every 5 (five) minutes as needed for chest pain.   pantoprazole (PROTONIX) 40 MG tablet Take 1 tablet (40 mg total) by mouth daily.   rosuvastatin (CRESTOR) 40 MG  tablet Take 1 tablet (40 mg total) by mouth daily.   spironolactone (ALDACTONE) 25 MG tablet Take 1 tablet (25 mg total) by mouth daily.   valsartan (DIOVAN) 40 MG tablet Take 1 tablet (40 mg total) by mouth daily.     Allergies:   Patient has no known allergies.   Social History   Socioeconomic History   Marital status: Single    Spouse name: Not on file   Number of children: Not on file   Years of education: Not on file   Highest education level: Not on file  Occupational History   Not on file  Tobacco Use   Smoking status: Every Day    Packs/day: 0.50    Years: 30.00    Additional pack years: 0.00    Total pack years: 15.00    Types: Cigarettes   Smokeless tobacco: Never   Tobacco comments:    6 cigarett /day  Vaping Use   Vaping Use:  Never used  Substance and Sexual Activity   Alcohol use: No   Drug use: Not Currently    Types: "Crack" cocaine, Other-see comments, Cocaine    Comment: last smoked crack 12-16-16   Sexual activity: Never  Other Topics Concern   Not on file  Social History Narrative   Not on file   Social Determinants of Health   Financial Resource Strain: Not on file  Food Insecurity: Not on file  Transportation Needs: Not on file  Physical Activity: Not on file  Stress: Not on file  Social Connections: Not on file     Family History: The patient's family history includes Diabetes in his brother and mother.  ROS:   Please see the history of present illness.     EKGs/Labs/Other Studies Reviewed:    The following studies were reviewed:  Left Cardiac Catheterization 01/02/2020: Prox LAD to Dist LAD lesion is 30% stenosed. Mid Cx to Dist Cx lesion is 60% stenosed. Mid RCA lesion is 70% stenosed. There is severe left ventricular systolic dysfunction. LV end diastolic pressure is mildly elevated. The left ventricular ejection fraction is 25-35% by visual estimate.   1. Diffuse coronary atherosclerosis with heavy calcification. Modest  single vessel obstructive CAD involving the mid RCA 2. Severe LV dysfunction EF estimated at 25-30% 3. Elevated EDP 24 mm Hg   Plan: aggressive medical therapy for CHF. Will add aldactone to Coreg, lasix, and losartan. Titrate as BP allows. High dose statin therapy. LV dysfunction is out of proportion to the degree of CAD.  Diagnostic Dominance: Right    _______________  Echocardiogram 12/04/2021: Impressions:  1. Apical, septal mid/apical inferior wall hypokinesis . Left ventricular  ejection fraction, by estimation, is 40 to 45%. The left ventricle has  mildly decreased function. The left ventricle demonstrates regional wall  motion abnormalities (see scoring  diagram/findings for description). The left ventricular internal cavity  size was moderately dilated. There is mild left ventricular hypertrophy.  Left ventricular diastolic parameters were normal.   2. Right ventricular systolic function is normal. The right ventricular  size is normal.   3. The mitral valve is normal in structure. Trivial mitral valve  regurgitation. No evidence of mitral stenosis.   4. The aortic valve is tricuspid. There is mild calcification of the  aortic valve. There is mild thickening of the aortic valve. Aortic valve  regurgitation is trivial. Aortic valve sclerosis is present, with no  evidence of aortic valve stenosis.   5. The inferior vena cava is normal in size with <50% respiratory  variability, suggesting right atrial pressure of 8 mmHg.   _______________  ABIs/ TBIs 03/30/2022: Summary:  Right: Resting right ankle-brachial index indicates mild right lower  extremity arterial disease. The right toe-brachial index is normal.   Left: Resting left ankle-brachial index indicates mild left lower  extremity arterial disease. The left toe-brachial index is normal.    EKG:  EKG ordered today. EKG personally reviewed and demonstrates sinus bradycardia, rate 59 bpm, with LVH and upsloping ST  segments in anterolaterals leads consistent with early repolarization. Normal axis. QTc 449 ms.  Recent Labs: 05/05/2022: Magnesium 1.8 11/08/2022: ALT 58; BUN 14; Creatinine, Ser 1.54; Hemoglobin 9.5; Platelets 122; Potassium 3.7; Sodium 143  Recent Lipid Panel    Component Value Date/Time   CHOL 98 (L) 11/08/2022 1352   TRIG 91 11/08/2022 1352   HDL 43 11/08/2022 1352   CHOLHDL 2.3 11/08/2022 1352   CHOLHDL 3.7 01/01/2020 0550   VLDL 20 01/01/2020  0550   LDLCALC 37 11/08/2022 1352    Physical Exam:    Vital Signs: BP (!) 178/80   Pulse (!) 59   Ht 5\' 8"  (1.727 m)   Wt 126 lb 12.8 oz (57.5 kg)   SpO2 98%   BMI 19.28 kg/m     Wt Readings from Last 3 Encounters:  12/13/22 126 lb 12.8 oz (57.5 kg)  11/08/22 131 lb 9.6 oz (59.7 kg)  08/02/22 133 lb (60.3 kg)     General: 57 y.o. thin African male in no acute distress. HEENT: Normocephalic and atraumatic. Sclera clear. EOMs intact. Neck: Supple. No carotid bruits. No JVD. Heart:  RRR. Distinct S1 and S2. No murmurs, gallops, or rubs.  Lungs: No increased work of breathing. Clear to ausculation bilaterally. No wheezes, rhonchi, or rales.  Abdomen: Soft, non-distended, and non-tender to palpation.  MSK: Normal strength and tone for age.  Extremities: No lower extremity edema. Right posterior tibial pulses 2+. Left posterior tibial and distal pedal pulse 1+. Skin: Warm and dry. Neuro: Alert and oriented x3. No focal deficits. Psych: Normal affect. Responds appropriately.   Assessment:    1. Chronic combined systolic and diastolic CHF (congestive heart failure) (HCC)   2. Non-ischemic cardiomyopathy (HCC)   3. Coronary artery disease involving native coronary artery of native heart without angina pectoris   4. PAD (peripheral artery disease) (HCC)   5. Primary hypertension   6. Hyperlipidemia, unspecified hyperlipidemia type   7. Type 2 diabetes mellitus with complication, with long-term current use of insulin (HCC)   8.  Stage 3a chronic kidney disease (HCC)   9. Tobacco abuse   10. Cocaine abuse (HCC)     Plan:    Chronic Combined CHF Non-Ischemic Cardiomyopathy Patient has a history of chronic combined CHF. Initially diagnosed in 12/2019 when found to have and EF of 30-35%. LHC at that time showed multivessel disease but degree of cardiomyopathy was felt to be out of proportion to degree of CAD. EF improved to 60-65% in 04/2021 after being started on GDMT. Last Echo in 11/2021 during a hospitalization for CAP showed LVEF of 40-45% with apical and septal mid/ apical inferior wall hypokinesis. - Euvolemic on exam. - Continue Valsartan 40mg  daily.  - Continue Coreg 12.5mg  twice daily. - Continue Spironolactone 25mg  daily. - Will add Farxiga 10mg  daily.  - Will check BMET. Will plan to stop Valsartan and switch to [facility] if renal function will allow. - Suspect drop in EF may be due to uncontrolled hypertension and ongoing cocaine use. Emphasized the importance of complete cessation of cocaine. Will try to optimize medications more and then repeat Echo in a couple of months.   CAD LHC in 12/2019 showed showed  diffuse coronary atherosclerosis with heavy calcification and 70% stenosis of mid RCA. Medical therapy recommended. - No chest pain.  - Continue Coreg 12.5mg  twice daily and Imdur 30mg  daily.  - Continue aspirin and statin.   PAD ABIs in 03/2022 were suggestive of mild arterial disease bilaterally (0.9 on the right and 0.82 on the left).  - He does described some cramping in left leg when walking up an incline that sounds like claudication. No cramping when walking on a flat surface. - Continue aspirin and statin. - Followed by Vascular Surgery.  Hypertension BP markedly elevated. Initially 187/82 and then 178/80 on my personal recheck at the end of visit. However, patient has not taken any of his medications today.  - Continue antianginals and CHF medications as  above. Will plan to stop Valsartan and  switch to [facility] if renal function will allow.  Hyperlipidemia Lipid panel in 11/2022: Total Cholesterol 98, Triglycerides 91, HDL 43, LDL 37. LDL goal <70 given CAD. - Continue Crestor 40mg  daily.   Type 2 Diabetes Mellitus Hemoglobin A1c 8.9% in 11/2022.  - On Insulin.  - Management per PCP.  CKD Stage III Baseline creatinine around 1.3 to 1.5. Stable at 1.54 on last labs in 11/08/2022.  - Will recheck BMET today.  Substance Abuse Disorder Tobacco Abuse Patient continues to smoke 3-4 cigarettes per day and uses cocaine about twice a month.  - Discussed importance of complete cessation. Will order Nicotine patches at patients request.   Disposition: Follow up in 2 weeks for continued med titration   Medication Adjustments/Labs and Tests Ordered: Current medicines are reviewed at length with the patient today.  Concerns regarding medicines are outlined above.  Orders Placed This Encounter  Procedures   Basic Metabolic Panel (BMET)   EKG 12-Lead   Meds ordered this encounter  Medications   dapagliflozin propanediol (FARXIGA) 10 MG TABS tablet    Sig: Take 1 tablet (10 mg total) by mouth daily before breakfast.    Dispense:  90 tablet    Refill:  3   nicotine (NICODERM CQ - DOSED IN MG/24 HR) 7 mg/24hr patch    Sig: Place 1 patch (7 mg total) onto the skin daily.    Dispense:  28 patch    Refill:  0    Patient Instructions  Medication Instructions:  Your physician has recommended you make the following change in your medication:  START: Farxiga 10mg  daily.  *If you need a refill on your cardiac medications before your next appointment, please call your pharmacy*   Lab Work: Your physician recommends that you have the following labs drawn today: BMET  If you have labs (blood work) drawn today and your tests are completely normal, you will receive your results only by: MyChart Message (if you have MyChart) OR A paper copy in the mail If you have any lab test that is  abnormal or we need to change your treatment, we will call you to review the results.   Testing/Procedures: NONE   Follow-Up: At [facility], you and your health needs are our priority.  As part of our continuing mission to provide you with exceptional heart care, we have created designated Provider Care Teams.  These Care Teams include your primary Cardiologist (physician) and Advanced Practice Providers (APPs -  Physician Assistants and Nurse Practitioners) who all work together to provide you with the care you need, when you need it.  We recommend signing up for the patient portal called "MyChart".  Sign up information is provided on this After Visit Summary.  MyChart is used to connect with patients for Virtual Visits (Telemedicine).  Patients are able to view lab/test results, encounter notes, upcoming appointments, etc.  Non-urgent messages can be sent to your provider as well.   To learn more about what you can do with MyChart, go to [email or web address].    Your next appointment:   Please keep scheduled appointment.    Signed, Rodney Parker, PA-C  12/13/2022 12:38 PM    [facility]

## 2022-12-13 ENCOUNTER — Other Ambulatory Visit: Payer: Self-pay

## 2022-12-13 ENCOUNTER — Ambulatory Visit: Payer: Medicaid Other | Attending: Student | Admitting: Student

## 2022-12-13 ENCOUNTER — Encounter: Payer: Self-pay | Admitting: Student

## 2022-12-13 VITALS — BP 178/80 | HR 59 | Ht 68.0 in | Wt 126.8 lb

## 2022-12-13 DIAGNOSIS — I5042 Chronic combined systolic (congestive) and diastolic (congestive) heart failure: Secondary | ICD-10-CM

## 2022-12-13 DIAGNOSIS — Z794 Long term (current) use of insulin: Secondary | ICD-10-CM

## 2022-12-13 DIAGNOSIS — Z720 Tobacco use: Secondary | ICD-10-CM

## 2022-12-13 DIAGNOSIS — E118 Type 2 diabetes mellitus with unspecified complications: Secondary | ICD-10-CM

## 2022-12-13 DIAGNOSIS — I739 Peripheral vascular disease, unspecified: Secondary | ICD-10-CM | POA: Diagnosis not present

## 2022-12-13 DIAGNOSIS — F1410 Cocaine abuse, uncomplicated: Secondary | ICD-10-CM

## 2022-12-13 DIAGNOSIS — E785 Hyperlipidemia, unspecified: Secondary | ICD-10-CM

## 2022-12-13 DIAGNOSIS — I2510 Atherosclerotic heart disease of native coronary artery without angina pectoris: Secondary | ICD-10-CM

## 2022-12-13 DIAGNOSIS — I10 Essential (primary) hypertension: Secondary | ICD-10-CM

## 2022-12-13 DIAGNOSIS — I428 Other cardiomyopathies: Secondary | ICD-10-CM | POA: Diagnosis not present

## 2022-12-13 DIAGNOSIS — N1831 Chronic kidney disease, stage 3a: Secondary | ICD-10-CM

## 2022-12-13 MED ORDER — DAPAGLIFLOZIN PROPANEDIOL 10 MG PO TABS
10.0000 mg | ORAL_TABLET | Freq: Every day | ORAL | 3 refills | Status: DC
  Filled 2022-12-13: qty 90, 90d supply, fill #0

## 2022-12-13 MED ORDER — NICOTINE 7 MG/24HR TD PT24
7.0000 mg | MEDICATED_PATCH | Freq: Every day | TRANSDERMAL | 0 refills | Status: AC
  Filled 2022-12-13: qty 28, 28d supply, fill #0

## 2022-12-13 NOTE — Patient Instructions (Signed)
Medication Instructions:  Your physician has recommended you make the following change in your medication:  START: Farxiga 10mg  daily.  *If you need a refill on your cardiac medications before your next appointment, please call your pharmacy*   Lab Work: Your physician recommends that you have the following labs drawn today: BMET  If you have labs (blood work) drawn today and your tests are completely normal, you will receive your results only by: MyChart Message (if you have MyChart) OR A paper copy in the mail If you have any lab test that is abnormal or we need to change your treatment, we will call you to review the results.   Testing/Procedures: NONE   Follow-Up: At [facility], you and your health needs are our priority.  As part of our continuing mission to provide you with exceptional heart care, we have created designated Provider Care Teams.  These Care Teams include your primary Cardiologist (physician) and Advanced Practice Providers (APPs -  Physician Assistants and Nurse Practitioners) who all work together to provide you with the care you need, when you need it.  We recommend signing up for the patient portal called "MyChart".  Sign up information is provided on this After Visit Summary.  MyChart is used to connect with patients for Virtual Visits (Telemedicine).  Patients are able to view lab/test results, encounter notes, upcoming appointments, etc.  Non-urgent messages can be sent to your provider as well.   To learn more about what you can do with MyChart, go to [email or web address].    Your next appointment:   Please keep scheduled appointment.

## 2022-12-14 ENCOUNTER — Other Ambulatory Visit: Payer: Self-pay

## 2022-12-14 LAB — BASIC METABOLIC PANEL
BUN/Creatinine Ratio: 11 (ref 9–20)
BUN: 16 mg/dL (ref 6–24)
CO2: 20 mmol/L (ref 20–29)
Calcium: 8.2 mg/dL — ABNORMAL LOW (ref 8.7–10.2)
Chloride: 104 mmol/L (ref 96–106)
Creatinine, Ser: 1.41 mg/dL — ABNORMAL HIGH (ref 0.76–1.27)
Glucose: 286 mg/dL — ABNORMAL HIGH (ref 70–99)
Potassium: 3.2 mmol/L — ABNORMAL LOW (ref 3.5–5.2)
Sodium: 138 mmol/L (ref 134–144)
eGFR: 58 mL/min/{1.73_m2} — ABNORMAL LOW (ref >59)

## 2022-12-15 ENCOUNTER — Other Ambulatory Visit: Payer: Self-pay

## 2022-12-18 ENCOUNTER — Other Ambulatory Visit: Payer: Self-pay

## 2022-12-18 DIAGNOSIS — I5042 Chronic combined systolic (congestive) and diastolic (congestive) heart failure: Secondary | ICD-10-CM

## 2022-12-18 DIAGNOSIS — Z79899 Other long term (current) drug therapy: Secondary | ICD-10-CM

## 2022-12-18 MED ORDER — POTASSIUM CHLORIDE CRYS ER 20 MEQ PO TBCR
20.0000 meq | EXTENDED_RELEASE_TABLET | Freq: Every day | ORAL | 0 refills | Status: DC
  Filled 2022-12-18: qty 10, 10d supply, fill #0

## 2022-12-18 MED ORDER — SACUBITRIL-VALSARTAN 49-51 MG PO TABS
1.0000 | ORAL_TABLET | Freq: Two times a day (BID) | ORAL | 3 refills | Status: DC
  Filled 2022-12-18: qty 60, 30d supply, fill #0

## 2022-12-18 MED ORDER — POTASSIUM CHLORIDE CRYS ER 20 MEQ PO TBCR
40.0000 meq | EXTENDED_RELEASE_TABLET | Freq: Once | ORAL | 0 refills | Status: AC
  Filled 2022-12-18: qty 10, 5d supply, fill #0

## 2022-12-18 NOTE — Telephone Encounter (Signed)
LEFT MESSAGE ON BROTHERS PHONE AND LETTER MAILED

## 2022-12-18 NOTE — Progress Notes (Signed)
Cardiology Office Note:    Date:  12/26/2022   ID:  Rodney Barber, DOB [birth year], MRN [id number]  PCP:  [name], MD  Cardiologist:  [name], MD  Electrophysiologist:  None   Referring MD: [name], MD   Chief Complaint: follow-up of CHF and hypertension  History of Present Illness:    Rodney Barber is a 57 y.o. 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 Swaziland and presents today for evaluation of CHF and hypertension.    Patient was admitted in 12/2019 for new acute systolic CHF. Echo at that time showed LVEF of 35-40% with global hypokinesis, normal RV, and no significant valvular disease. He underwent LHC which showed diffuse coronary atherosclerosis with heavy calcification and 70% stenosis of mid RCA. Degree of LV dysfunction was felt to be out of proportion to the degree of CAD. Medical therapy was recommended and patient was started on GDMT. EF later normalized to 60-65% on repeat Echo in 04/2021. He was last seen by [name], PA-C, in 06/2021 at which time he reported bilateral leg pain and well as cold feet and occasional numbness/tingling concerning for claudication. ABIs were ordered and were normal bilaterally.    Patient was admitted from 12/02/2021 to 12/06/2021 for acute hypoxic respiratory failure secondary to community acquired pneumonia and COPD. Echo during this admission showed LVEF of 40-45% with apical and septal mid/ apical inferior wall hypokinesis.   ABIs in 03/2022 were suggestive of mild arterial disease bilaterally (0.9 on the right and 0.82 on the left).   He was  last seen by me on 12/13/2022 after not having been seen since 06/2021. He was doing well at that time from a cardiac standpoint with no chest pain or CHF symptoms. He did describes some claudication but this was stable. His BP was markedly elevated. Valsartan was stopped and he was started on Entresto as well as [place]. Plan was to optimized GDMT and get BP better controlled and then repeat an Echo to reassess LV function. He continued to endorse occasional cocaine use and he was counseled on the importance of complete cessation.   Patient presents today for follow-up. Here alone. He never started the [place]. He states insurance would not cover the [place] and he forgot about the [place]. However, BP is much better today. It was initially 138/60 and then 120/60 on my personal recheck at the end of visit. He denies any chest pain, shortness of breath, orthopnea, PND, edema, palpitations, lightheadedness, dizziness, or syncope. He continues to smoke about 3 cigarettes per day and use cocaine about once a month.    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   Anemia    Diabetes mellitus    Hypertension    Pancreatitis    Substance use disorder 02/18/2019    Past Surgical History:  Procedure Laterality Date   ERCP  06/27/2011   Procedure: ENDOSCOPIC RETROGRADE CHOLANGIOPANCREATOGRAPHY (ERCP);  Surgeon: Petra Kuba, MD;  Location: [facility];  Service: Endoscopy;  Laterality: N/A;   ERCP W/ PLASTIC STENT PLACEMENT  03/2011   IR US GUIDE BX ASP/DRAIN  05/16/2019   LEFT HEART CATH  AND CORONARY ANGIOGRAPHY N/A 01/02/2020   Procedure: LEFT HEART CATH AND CORONARY ANGIOGRAPHY;  Surgeon: Swaziland, Peter M, MD;  Location: [facility] INVASIVE CV LAB;  Service: Cardiovascular;  Laterality: N/A;   TEE WITHOUT CARDIOVERSION N/A 05/20/2019   Procedure: TRANSESOPHAGEAL ECHOCARDIOGRAM (TEE);  Surgeon: Chrystie Nose, MD;  Location: [facility] ENDOSCOPY;  Service:  Cardiovascular;  Laterality: N/A;    Current Medications: Current Meds  Medication Sig   Accu-Chek Softclix Lancets lancets Ues to check blood sugar 4 (four) times daily.   acetaminophen (TYLENOL) 325 MG tablet Take 2 tablets (650 mg total) by mouth every 6 (six) hours as needed for mild pain.   aspirin 81 MG chewable tablet Chew 1 tablet (81 mg total) by mouth daily.   Blood Glucose Monitoring Suppl (ACCU-CHEK GUIDE) w/Device KIT Use to check blood sugar four times daily. E11.65   Blood Pressure Monitoring (BLOOD PRESSURE KIT) DEVI Use to measure blood pressure   dapagliflozin propanediol (FARXIGA) 10 MG TABS tablet Take 1 tablet (10 mg total) by mouth daily before breakfast.   feeding supplement, GLUCERNA SHAKE, (GLUCERNA SHAKE) LIQD Take 237 mLs by mouth 3 (three) times daily between meals.   ferrous sulfate (FEROSUL) 325 (65 FE) MG tablet Take 1 tablet (325 mg total) by mouth 2 (two) times daily with a meal.   gabapentin (NEURONTIN) 100 MG capsule Take 1 capsule (100 mg total) by mouth 2 (two) times daily.   Glucose 4-6 GM-MG CHEW Chew one as needed for blood glucose < 70 (Patient taking differently: Chew 1 tablet by mouth 2 (two) times daily as needed (low glucose). Chew one as needed for blood glucose < 70)   glucose blood (ACCU-CHEK GUIDE) test strip Use to check blood sugar four times daily.   insulin glargine (LANTUS) 100 UNIT/ML Solostar Pen Inject 25 Units into the skin daily.   insulin lispro (HUMALOG) 100 UNIT/ML KwikPen Inject 5 Units into the skin 2 (two) times daily before a meal. Skip your dose if you do not eat or if your sugar is <150.   Insulin Pen Needle 31G X 5 MM MISC USE WITH INSULIN PENS   isosorbide mononitrate (IMDUR) 30 MG 24 hr tablet Take 1 tablet (30 mg total) by mouth daily.   nitroGLYCERIN (NITROSTAT) 0.4 MG SL tablet Place 1 tablet (0.4 mg total) under the tongue every 5 (five) minutes as needed for chest pain.   pantoprazole (PROTONIX) 40 MG tablet Take 1  tablet (40 mg total) by mouth daily.   rosuvastatin (CRESTOR) 40 MG tablet Take 1 tablet (40 mg total) by mouth daily.   spironolactone (ALDACTONE) 25 MG tablet Take 1 tablet (25 mg total) by mouth daily.   valsartan (DIOVAN) 40 MG tablet Take 40 mg by mouth daily. Take 1 Tablet Daily   [DISCONTINUED] sacubitril-valsartan (ENTRESTO) 49-51 MG Take 1 tablet by mouth 2 (two) times daily.     Allergies:   Patient has no known allergies.   Social History   Socioeconomic History   Marital status: Single    Spouse name: Not on file   Number of children: Not on file   Years of education: Not on file   Highest education level: Not on file  Occupational History   Not on file  Tobacco Use   Smoking status: Every Day    Packs/day: 0.50    Years: 30.00    Additional pack years: 0.00    Total pack years: 15.00    Types: Cigarettes   Smokeless tobacco: Never  Tobacco comments:    6 cigarett /day  Vaping Use   Vaping Use: Never used  Substance and Sexual Activity   Alcohol use: No   Drug use: Not Currently    Types: "Crack" cocaine, Other-see comments, Cocaine    Comment: last smoked crack 12-16-16   Sexual activity: Never  Other Topics Concern   Not on file  Social History Narrative   Not on file   Social Determinants of Health   Financial Resource Strain: Not on file  Food Insecurity: Not on file  Transportation Needs: Not on file  Physical Activity: Not on file  Stress: Not on file  Social Connections: Not on file     Family History: The patient's family history includes Diabetes in his brother and mother.  ROS:   Please see the history of present illness.     EKGs/Labs/Other Studies Reviewed:    The following studies were reviewed today: Left Cardiac Catheterization 01/02/2020: Prox LAD to Dist LAD lesion is 30% stenosed. Mid Cx to Dist Cx lesion is 60% stenosed. Mid RCA lesion is 70% stenosed. There is severe left ventricular systolic dysfunction. LV end  diastolic pressure is mildly elevated. The left ventricular ejection fraction is 25-35% by visual estimate.   1. Diffuse coronary atherosclerosis with heavy calcification. 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 Severe LV dysfunction EF estimated at 25-30% 3. Elevated EDP 24 mm Hg   Plan: aggressive medical therapy for CHF. Will add aldactone to Coreg, lasix, and losartan. Titrate as BP allows. High dose statin therapy. LV dysfunction is out of proportion to the degree of CAD.   Diagnostic Dominance: Right    _______________   Echocardiogram 12/04/2021: Impressions:  1. Apical, septal mid/apical inferior wall hypokinesis . Left ventricular  ejection fraction, by estimation, is 40 to 45%. The left ventricle has  mildly decreased function. The left ventricle demonstrates regional wall  motion abnormalities (see scoring  diagram/findings for description). The left ventricular internal cavity  size was moderately dilated. There is mild left ventricular hypertrophy.  Left ventricular diastolic parameters were normal.   2. Right ventricular systolic function is normal. The right ventricular  size is normal.   3. The mitral valve is normal in structure. Trivial mitral valve  regurgitation. No evidence of mitral stenosis.   4. The aortic valve is tricuspid. There is mild calcification of the  aortic valve. There is mild thickening of the aortic valve. Aortic valve  regurgitation is trivial. Aortic valve sclerosis is present, with no  evidence of aortic valve stenosis.   5. The inferior vena cava is normal in size with <50% respiratory  variability, suggesting right atrial pressure of 8 mmHg.    _______________   ABIs/ TBIs 03/30/2022: Summary:  Right: Resting right ankle-brachial index indicates mild right lower  extremity arterial disease. The right toe-brachial index is normal.   Left: Resting left ankle-brachial index indicates mild left lower  extremity  arterial disease. The left toe-brachial index is normal.      EKG:  EKG not ordered today.   Recent Labs: 05/05/2022: Magnesium 1.8 11/08/2022: ALT 58; Hemoglobin 9.5; Platelets 122 12/13/2022: BUN 16; Creatinine, Ser 1.41; Potassium 3.2; Sodium 138  Recent Lipid Panel    Component Value Date/Time   CHOL 98 (L) 11/08/2022 1352   TRIG 91 11/08/2022 1352   HDL 43 11/08/2022 1352   CHOLHDL 2.3 11/08/2022 1352   CHOLHDL 3.7 01/01/2020 0550   VLDL 20 01/01/2020 0550   LDLCALC  37 11/08/2022 1352    Physical Exam:    Vital Signs: BP 120/60   Pulse 65   Ht 5\' 8"  (1.727 m)   Wt 127 lb (57.6 kg)   SpO2 98%   BMI 19.31 kg/m     Wt Readings from Last 3 Encounters:  12/26/22 127 lb (57.6 kg)  12/13/22 126 lb 12.8 oz (57.5 kg)  11/08/22 131 lb 9.6 oz (59.7 kg)     General: 57 y.o. thin African-American male in no acute distress. HEENT: Normocephalic and atraumatic. Sclera clear.  Neck: Supple. No JVD. Heart:  RRR. Distinct S1 and S2. Possible soft II/VI along left sternal border. No gallops or rubs. Lungs: No increased work of breathing. Clear to ausculation bilaterally. No wheezes, rhonchi, or rales.  Abdomen: Soft, non-distended, and non-tender to palpation.  Extremities: No lower extremity edema.    Skin: Warm and dry. Neuro: Alert and oriented x3. No focal deficits. Psych: Normal affect. Responds appropriately.   Assessment:    1. Chronic combined systolic and diastolic CHF (congestive heart failure) (HCC)   2. Non-ischemic cardiomyopathy (HCC)   3. Coronary artery disease involving native coronary artery of native heart without angina pectoris   4. PAD (peripheral artery disease) (HCC)   5. Primary hypertension   6. Hyperlipidemia, unspecified hyperlipidemia type   7. Type 2 diabetes mellitus with complication, with long-term current use of insulin (HCC)   8. Hypokalemia   9. Stage 3 chronic kidney disease, unspecified whether stage 3a or 3b CKD (HCC)   10. Tobacco  abuse   11. Cocaine abuse (HCC)     Plan:    Chronic Combined CHF Non-Ischemic Cardiomyopathy Patient has a history of chronic combined CHF. Initially diagnosed in 12/2019 when found to have and EF of 30-35%. LHC at that time showed multivessel disease but degree of cardiomyopathy was felt to be out of proportion to degree of CAD. EF improved to 60-65% in 04/2021 after being started on GDMT. Last Echo in 11/2021 during a hospitalization for CAP showed LVEF of 40-45% with apical and septal mid/ apical inferior wall hypokinesis. - Euvolemic on exam. - Continue Coreg 12.5mg  twice daily. - Continue Spironolactone 25mg  daily. - He never started the [facility] as prescribed and is still taking Valsarta. However, BP is well controlled today and I worry about compliance. Therefore, OK to continue Valsartan 40mg  daily for now. - He never started the [place] because he stated the inusrance would not cover its. Called and spoke with his Pharmacy and was told he will need a prior authorization. Will provide samples of Farxiga 10mg  daily today while we work on the prior authorization.  - Will repeat BMET in about 1 week after starting Farxiga.  - Suspect drop in EF may be due to uncontrolled hypertension and ongoing cocaine use. Emphasized the importance of complete cessation of cocaine. Will try to optimize medications more and then repeat Echo in a couple of months. Can order at follow-up visit.    CAD LHC in 12/2019 showed showed  diffuse coronary atherosclerosis with heavy calcification and 70% stenosis of mid RCA. Medical therapy recommended. - No chest pain.  - Continue Coreg 12.5mg  twice daily and Imdur 30mg  daily.  - Continue aspirin and statin.    PAD ABIs in 03/2022 were suggestive of mild arterial disease bilaterally (0.9 on the right and 0.82 on the left).  - He does described some cramping in left leg when walking up an incline that sounds like claudication. No cramping  when walking on a flat  surface. - Continue aspirin and statin. - Followed by Vascular Surgery.   Hypertension BP much better controlled today compared to last visit. Initially 138/60 but then 120/60 on my personal recheck at the end of the visit.  - Continue Valsartan, Coreg, Spironolactone, and Imdur as above. - Asked patient to keep BP/HR log for 2 weeks and then drop this off at the office when he brings in his BP log in 2 weeks. If average BP is >130/80 at that time, will stop Valsartan and switch to [facility].    Hyperlipidemia Lipid panel in 11/2022: Total Cholesterol 98, Triglycerides 91, HDL 43, LDL 37. LDL goal <70 given CAD. - Continue Crestor 40mg  daily.    Type 2 Diabetes Mellitus Hemoglobin A1c 8.9% in 11/2022.  - On Insulin.  - Management per PCP.  Hypokalemia Potassium was mildly low at 3.2 on 12/13/2022. - A one time dose of KCl 40 mEq once prescribed after these most recent labs but after visit I realized he never picked this prescription up. Therefore, I called patient and advised him to go and pick up prescription.  - Will repeat BMET in 1 week.    CKD Stage III Baseline creatinine around 1.3 to 1.5. Stable at 1.41 on most recent labs on 12/13/2022.    Tobacco Abuse Cocaine Abuse Patient continues to smoke 3 cigarettes per day and uses cocaine about twice a month.   - Discussed importance of complete cessation. Nicotine patches were provided at last visit.  Disposition: Follow up in 3 months.    Medication Adjustments/Labs and Tests Ordered: Current medicines are reviewed at length with the patient today.  Concerns regarding medicines are outlined above.  Orders Placed This Encounter  Procedures   Basic metabolic panel   Meds ordered this encounter  Medications   dapagliflozin propanediol (FARXIGA) 10 MG TABS tablet    Sig: Take 1 tablet (10 mg total) by mouth daily before breakfast.    Dispense:  14 tablet    Refill:  0    Patient Instructions  Medication Instructions:   Restart Valsartan 40 mg ( Take 1 Tablet Daily). *If you need a refill on your cardiac medications before your next appointment, please call your pharmacy*   Lab Work: BMET 2 weeks ( Please Bring Blood Pressure Log In 2 weeks). If you have labs (blood work) drawn today and your tests are completely normal, you will receive your results only by: MyChart Message (if you have MyChart) OR A paper copy in the mail If you have any lab test that is abnormal or we need to change your treatment, we will call you to review the results.   Testing/Procedures: No Testing   Follow-Up: At [facility], you and your health needs are our priority.  As part of our continuing mission to provide you with exceptional heart care, we have created designated Provider Care Teams.  These Care Teams include your primary Cardiologist (physician) and Advanced Practice Providers (APPs -  Physician Assistants and Nurse Practitioners) who all work together to provide you with the care you need, when you need it.  We recommend signing up for the patient portal called "MyChart".  Sign up information is provided on this After Visit Summary.  MyChart is used to connect with patients for Virtual Visits (Telemedicine).  Patients are able to view lab/test results, encounter notes, upcoming appointments, etc.  Non-urgent messages can be sent to your provider as well.   To learn  more about what you can do with MyChart, go to [email or web address].    Your next appointment:   3 month(s)  Provider:   Marjie Skiff, PA-C    or, [name], MD   Other Instructions Heart Failure Education:  Weigh yourself EVERY morning after you go to the bathroom but before you eat or drink anything. Write this number down in a weight log/diary. If you gain 3 pounds overnight or 5 pounds in a week, call the office. Take your medicines as prescribed. If you have concerns about your medications, please call us before you stop  taking them. Eat low salt foods--Limit salt (sodium) to 2000 mg per day. This will help prevent your body from holding onto fluid. Read food labels as many processed foods have a lot of sodium, especially canned goods and prepackaged meats. If you would like some assistance choosing low sodium foods, we would be happy to set you up with a nutritionist. Limit all fluids for the day to less than 2 liters (64 ounces). Fluid includes all drinks, coffee, juice, ice chips, soup, jello, and all other liquids. Stay as active as you can everyday. Staying active will give you more energy and make your muscles stronger. Start with 5 minutes at a time and work your way up to 30 minutes a day. Break up your activities--do some in the morning and some in the afternoon. Start with 3 days per week and work your way up to 5 days as you can.  If you have chest pain, feel short of breath, dizzy, or lightheaded, STOP. If you don't feel better after a short rest, call 911. If you do feel better, call the office to let us know you have symptoms with exercise.    Thanks so much!  [address], Smitty Knudsen  12/26/2022 8:28 PM     [facility]

## 2022-12-22 ENCOUNTER — Other Ambulatory Visit: Payer: Self-pay

## 2022-12-25 ENCOUNTER — Other Ambulatory Visit: Payer: Self-pay

## 2022-12-26 ENCOUNTER — Other Ambulatory Visit (HOSPITAL_COMMUNITY): Payer: Self-pay

## 2022-12-26 ENCOUNTER — Other Ambulatory Visit: Payer: Self-pay

## 2022-12-26 ENCOUNTER — Ambulatory Visit: Payer: Medicaid Other | Attending: Student | Admitting: Student

## 2022-12-26 ENCOUNTER — Telehealth: Payer: Self-pay

## 2022-12-26 ENCOUNTER — Encounter: Payer: Self-pay | Admitting: Student

## 2022-12-26 VITALS — BP 120/60 | HR 65 | Ht 68.0 in | Wt 127.0 lb

## 2022-12-26 DIAGNOSIS — N183 Chronic kidney disease, stage 3 unspecified: Secondary | ICD-10-CM

## 2022-12-26 DIAGNOSIS — I2510 Atherosclerotic heart disease of native coronary artery without angina pectoris: Secondary | ICD-10-CM | POA: Diagnosis not present

## 2022-12-26 DIAGNOSIS — I739 Peripheral vascular disease, unspecified: Secondary | ICD-10-CM

## 2022-12-26 DIAGNOSIS — E876 Hypokalemia: Secondary | ICD-10-CM

## 2022-12-26 DIAGNOSIS — I5042 Chronic combined systolic (congestive) and diastolic (congestive) heart failure: Secondary | ICD-10-CM | POA: Diagnosis not present

## 2022-12-26 DIAGNOSIS — E118 Type 2 diabetes mellitus with unspecified complications: Secondary | ICD-10-CM

## 2022-12-26 DIAGNOSIS — I428 Other cardiomyopathies: Secondary | ICD-10-CM

## 2022-12-26 DIAGNOSIS — E785 Hyperlipidemia, unspecified: Secondary | ICD-10-CM

## 2022-12-26 DIAGNOSIS — Z794 Long term (current) use of insulin: Secondary | ICD-10-CM

## 2022-12-26 DIAGNOSIS — Z720 Tobacco use: Secondary | ICD-10-CM

## 2022-12-26 DIAGNOSIS — I10 Essential (primary) hypertension: Secondary | ICD-10-CM

## 2022-12-26 DIAGNOSIS — F1410 Cocaine abuse, uncomplicated: Secondary | ICD-10-CM

## 2022-12-26 MED ORDER — DAPAGLIFLOZIN PROPANEDIOL 10 MG PO TABS: 10.0000 mg | ORAL_TABLET | Freq: Every day | ORAL | 0 refills | Status: DC

## 2022-12-26 NOTE — Telephone Encounter (Signed)
Patient needs a Prior Authorization For Farxiga 10 mg Daily

## 2022-12-26 NOTE — Telephone Encounter (Signed)
Pharmacy Patient Advocate Encounter   Received notification from [facility] that prior authorization for FARXIGA 10 is required/requested.   PA submitted on 5.21.24 to (ins) [employer] [facility] via [employer] or ([place]) confirmation #[id number]   Status is pending

## 2022-12-26 NOTE — Patient Instructions (Addendum)
Medication Instructions:  Restart Valsartan 40 mg ( Take 1 Tablet Daily). *If you need a refill on your cardiac medications before your next appointment, please call your pharmacy*   Lab Work: BMET 2 weeks ( Please Bring Blood Pressure Log In 2 weeks). If you have labs (blood work) drawn today and your tests are completely normal, you will receive your results only by: MyChart Message (if you have MyChart) OR A paper copy in the mail If you have any lab test that is abnormal or we need to change your treatment, we will call you to review the results.   Testing/Procedures: No Testing   Follow-Up: At [facility], you and your health needs are our priority.  As part of our continuing mission to provide you with exceptional heart care, we have created designated Provider Care Teams.  These Care Teams include your primary Cardiologist (physician) and Advanced Practice Providers (APPs -  Physician Assistants and Nurse Practitioners) who all work together to provide you with the care you need, when you need it.  We recommend signing up for the patient portal called "MyChart".  Sign up information is provided on this After Visit Summary.  MyChart is used to connect with patients for Virtual Visits (Telemedicine).  Patients are able to view lab/test results, encounter notes, upcoming appointments, etc.  Non-urgent messages can be sent to your provider as well.   To learn more about what you can do with MyChart, go to [email or web address].    Your next appointment:   3 month(s)  Provider:   Marjie Skiff, PA-C    or, [name], MD   Other Instructions Heart Failure Education:  Weigh yourself EVERY morning after you go to the bathroom but before you eat or drink anything. Write this number down in a weight log/diary. If you gain 3 pounds overnight or 5 pounds in a week, call the office. Take your medicines as prescribed. If you have concerns about your medications, please call  us before you stop taking them. Eat low salt foods--Limit salt (sodium) to 2000 mg per day. This will help prevent your body from holding onto fluid. Read food labels as many processed foods have a lot of sodium, especially canned goods and prepackaged meats. If you would like some assistance choosing low sodium foods, we would be happy to set you up with a nutritionist. Limit all fluids for the day to less than 2 liters (64 ounces). Fluid includes all drinks, coffee, juice, ice chips, soup, jello, and all other liquids. Stay as active as you can everyday. Staying active will give you more energy and make your muscles stronger. Start with 5 minutes at a time and work your way up to 30 minutes a day. Break up your activities--do some in the morning and some in the afternoon. Start with 3 days per week and work your way up to 5 days as you can.  If you have chest pain, feel short of breath, dizzy, or lightheaded, STOP. If you don't feel better after a short rest, call 911. If you do feel better, call the office to let us know you have symptoms with exercise.    Thanks so much!  Callie

## 2022-12-27 ENCOUNTER — Telehealth: Payer: Self-pay | Admitting: Student

## 2022-12-27 ENCOUNTER — Other Ambulatory Visit: Payer: Self-pay

## 2022-12-27 ENCOUNTER — Other Ambulatory Visit (HOSPITAL_COMMUNITY): Payer: Self-pay

## 2022-12-27 NOTE — Telephone Encounter (Signed)
Spoke with patient regarding Rodney Barber and when to take this. Advised to take before breakfast.  Also discussed the PA is pending as well.  He ask when to take the [place] three times, and confirms before breakfast each time.

## 2022-12-27 NOTE — Telephone Encounter (Signed)
Left message for pt to call.

## 2022-12-27 NOTE — Telephone Encounter (Signed)
Pt c/o medication issue:  1. Name of Medication:   dapagliflozin propanediol (FARXIGA) 10 MG TABS tablet    2. How are you currently taking this medication (dosage and times per day)?   Take 1 tablet (10 mg total) by mouth daily before breakfast    3. Are you having a reaction (difficulty breathing--STAT)? No  4. What is your medication issue? Pt would like a callback regarding if he should take this medication with the other medications he's on and he also has a few questions. Please advise

## 2023-01-02 ENCOUNTER — Other Ambulatory Visit: Payer: Self-pay

## 2023-01-03 ENCOUNTER — Other Ambulatory Visit: Payer: Self-pay | Admitting: Critical Care Medicine

## 2023-01-03 ENCOUNTER — Other Ambulatory Visit: Payer: Self-pay

## 2023-01-03 MED ORDER — CREON 6000-19000 UNITS PO CPEP
ORAL_CAPSULE | ORAL | 6 refills | Status: AC
  Filled 2023-01-03: qty 180, 30d supply, fill #0
  Filled 2023-04-20: qty 180, 30d supply, fill #1
  Filled 2023-08-15: qty 200, 34d supply, fill #2

## 2023-01-04 ENCOUNTER — Other Ambulatory Visit: Payer: Self-pay

## 2023-01-05 ENCOUNTER — Other Ambulatory Visit: Payer: Self-pay

## 2023-01-05 NOTE — Telephone Encounter (Signed)
Pharmacy Patient Advocate Encounter  Prior Authorization for FARXIGA 10MG  has been APPROVED by [employer]  from 5.28.24 to 5.28.25.   KEY # [id number]

## 2023-01-05 NOTE — Telephone Encounter (Signed)
Pharmacy Patient Advocate Encounter  Prior Authorization for FARXIGA 10MG has been APPROVED by [employer] [facility] [facility]  from 5.28.24 to 5.28.25.   KEY # [id number]    

## 2023-01-09 NOTE — Telephone Encounter (Signed)
Call to advise patient of approval.  He states he has already picked up prescription

## 2023-01-29 ENCOUNTER — Other Ambulatory Visit: Payer: Self-pay

## 2023-02-12 ENCOUNTER — Other Ambulatory Visit: Payer: Self-pay

## 2023-02-23 ENCOUNTER — Other Ambulatory Visit: Payer: Self-pay | Admitting: Critical Care Medicine

## 2023-02-23 ENCOUNTER — Other Ambulatory Visit: Payer: Self-pay

## 2023-02-23 MED ORDER — VALSARTAN 40 MG PO TABS
40.0000 mg | ORAL_TABLET | Freq: Every day | ORAL | 1 refills | Status: AC
  Filled 2023-02-23: qty 90, 90d supply, fill #0
  Filled 2023-06-04: qty 30, 30d supply, fill #1
  Filled 2023-07-20: qty 30, 30d supply, fill #2
  Filled 2023-10-23: qty 30, 30d supply, fill #3

## 2023-02-26 ENCOUNTER — Other Ambulatory Visit: Payer: Self-pay

## 2023-03-12 ENCOUNTER — Other Ambulatory Visit: Payer: Self-pay | Admitting: Critical Care Medicine

## 2023-03-12 ENCOUNTER — Other Ambulatory Visit: Payer: Self-pay

## 2023-03-12 ENCOUNTER — Other Ambulatory Visit: Payer: Self-pay | Admitting: Pharmacist

## 2023-03-12 MED ORDER — INSULIN LISPRO (1 UNIT DIAL) 100 UNIT/ML (KWIKPEN)
5.0000 [IU] | PEN_INJECTOR | Freq: Two times a day (BID) | SUBCUTANEOUS | 1 refills | Status: DC
  Filled 2023-03-12: qty 3, 30d supply, fill #0
  Filled 2023-05-21: qty 3, 30d supply, fill #1

## 2023-03-13 ENCOUNTER — Other Ambulatory Visit: Payer: Self-pay

## 2023-03-27 ENCOUNTER — Other Ambulatory Visit: Payer: Self-pay | Admitting: *Deleted

## 2023-03-27 DIAGNOSIS — I739 Peripheral vascular disease, unspecified: Secondary | ICD-10-CM

## 2023-03-31 NOTE — Progress Notes (Deleted)
Cardiology Office Note:    Date:  03/31/2023   ID:  Rodney Barber, DOB 12-Mar-1966, MRN [id number]  PCP:  [name], MD  Cardiologist:  [name], MD  Electrophysiologist:  None   Referring MD: [name], MD   Chief Complaint: follow-up of CHF and hypertension   History of Present Illness:    [name] is a 57 y.o. 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 Sw[place]and and presents today for follow-up of CHF and hypertension.    Patient was admitted in 12/2019 for new acute systolic CHF. Echo at that time showed LVEF of 35-40% with global hypokinesis, normal RV, and no significant valvular disease. He underwent LHC which showed diffuse coronary atherosclerosis with heavy calcification and 70% stenosis of mid RCA. Degree of LV dysfunction was felt to be out of proportion to the degree of CAD. Medical therapy was recommended and patient was started on GDMT. EF later normalized to 60-65% on repeat Echo in 04/2021. He was last seen by [name], PA-C, in 06/2021 at which time he reported bilateral leg pain and well as cold feet and occasional numbness/tingling concerning for claudication. ABIs were ordered and were normal bilaterally.    Patient was admitted from 12/02/2021 to 12/06/2021 for acute hypoxic respiratory failure secondary to community acquired pneumonia and COPD. Echo during this admission showed LVEF of 40-45% with apical and septal mid/ apical inferior wall hypokinesis.   ABIs in 03/2022 were suggestive of mild arterial disease bilaterally (0.9 on the right and 0.82 on the left).   He was  seen by me twice in 12/2022 at which time he denied any cardiac symptoms but continued to report occasion cocaine use. BP was markedly elevated and GDMT was adjusted. Drop in EF was felt to likely be due to uncontrolled hypertension and ongoing cocaine use. Plan was to optimized GDMT and then repeat Echo.  Patient presents today for follow-up. ***  Chronic Combined CHF Non-Ischemic Cardiomyopathy Patient has a history of chronic combined CHF. Initially diagnosed in 12/2019 when found to have and EF of 30-35%. LHC at that time showed multivessel disease but degree of cardiomyopathy was felt to be out of proportion to degree of CAD. EF improved to 60-65% in 04/2021 after being started on GDMT. Last Echo in 11/2021 during a hospitalization for CAP showed LVEF of 40-45% with apical and septal mid/ apical inferior wall hypokinesis. - Euvolemic on exam. - Continue Valsartan 40mg  daily.  - Continue Coreg 12.5mg  twice daily. - Continue Spironolactone 25mg  daily. - Continue Farxiga 10mg  daily.  - Continue Imdur 69m daily.  - Suspect drop in EF may be due to uncontrolled hypertension and ongoing cocaine use. Emphasized the importance of complete cessation of cocaine. Will try to optimize medications more and then repeat Echo in a couple of months. Can order at follow-up visit.  ***   CAD LHC in 12/2019 showed showed  diffuse coronary atherosclerosis with heavy calcification and 70% stenosis of mid RCA. Medical therapy recommended. - No chest pain.  - Continue Coreg 12.5mg  twice daily and Imdur 30mg  daily.  - Continue aspirin and statin.    PAD ABIs in 03/2022 were suggestive of mild arterial  disease bilaterally (0.9 on the right and 0.82 on the left).  - Continue aspirin and statin. - Followed by Vascular Surgery. Repeat ABIs are scheduled for 04/05/2023.    Hypertension BP *** - Continue medications for CHF as above.   Hyperlipidemia Lipid panel in 11/2022: Total Cholesterol 98, Triglycerides 91, HDL  43, LDL 37. LDL goal <70 given CAD. - Continue Crestor 40mg  daily.    Type 2 Diabetes Mellitus Hemoglobin A1c 8.9% in 11/2022.  - On Insulin.  - Management per PCP.   Hypokalemia Potassium was mildly low at 3.2 on 12/13/2022. - Will repeat BMET today.   CKD Stage III Baseline creatinine around 1.3 to 1.5. Stable at 1.41 on most recent labs on 12/13/2022.    Tobacco Abuse Cocaine Abuse Patient continues to smoke 3 cigarettes per day and uses cocaine about twice a month.  *** - Discussed importance of complete cessation. Nicotine patches were provided at last visit. ***  EKGs/Labs/Other Studies Reviewed:    The following studies were reviewed:  Left Cardiac Catheterization 01/02/2020: Prox LAD to Dist LAD lesion is 30% stenosed. Mid Cx to Dist Cx lesion is 60% stenosed. Mid RCA lesion is 70% stenosed. There is severe left ventricular systolic dysfunction. LV end diastolic pressure is mildly elevated. The left ventricular ejection fraction is 25-35% by visual estimate.   1. Diffuse coronary atherosclerosis with heavy calcification. 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 Severe LV dysfunction EF estimated at 25-30% 3. Elevated EDP 24 mm Hg   Plan: aggressive medical therapy for CHF. Will add aldactone to Coreg, lasix, and losartan. Titrate as BP allows. High dose statin therapy. LV dysfunction is out of proportion to the degree of CAD.   Diagnostic Dominance: Right    _______________   Echocardiogram 12/04/2021: Impressions:  1. Apical, septal mid/apical inferior wall hypokinesis . Left ventricular  ejection fraction, by estimation, is 40 to 45%. The left ventricle has  mildly decreased function. The left ventricle demonstrates regional wall  motion abnormalities (see scoring  diagram/findings for description). The left ventricular internal cavity  size was moderately dilated. There is mild left ventricular hypertrophy.  Left ventricular diastolic  parameters were normal.   2. Right ventricular systolic function is normal. The right ventricular  size is normal.   3. The mitral valve is normal in structure. Trivial mitral valve  regurgitation. No evidence of mitral stenosis.   4. The aortic valve is tricuspid. There is mild calcification of the  aortic valve. There is mild thickening of the aortic valve. Aortic valve  regurgitation is trivial. Aortic valve sclerosis is present, with no  evidence of aortic valve stenosis.   5. The inferior vena cava is normal in size with <50% respiratory  variability, suggesting right atrial pressure of 8 mmHg.    _______________   ABIs/ TBIs 03/30/2022: Summary:  Right: Resting right ankle-brachial index indicates mild right lower  extremity arterial disease. The right toe-brachial index is normal.   Left: Resting left ankle-brachial index indicates mild left lower  extremity arterial disease. The left toe-brachial index is normal.   EKG:  EKG not ordered today.   Recent Labs: 05/05/2022: Magnesium 1.8 11/08/2022: ALT 58; Hemoglobin 9.5; Platelets 122 12/13/2022: BUN 16; Creatinine, Ser 1.41; Potassium 3.2; Sodium 138  Recent Lipid Panel    Component Value Date/Time   CHOL 98 (L) 11/08/2022 1352   TRIG 91 11/08/2022 1352   HDL 43 11/08/2022 1352   CHOLHDL 2.3 11/08/2022 1352   CHOLHDL  3.7 01/01/2020 0550   VLDL 20 01/01/2020 0550   LDLCALC 37 11/08/2022 1352    Physical Exam:    Vital Signs: There were no vitals taken for this visit.    Wt Readings from Last 3 Encounters:  12/26/22 127 lb (57.6 kg)  12/13/22 126 lb 12.8 oz (57.5 kg)  11/08/22 131 lb 9.6 oz (59.7 kg)     General: 57 y.o. male in no acute distress. HEENT: Normocephalic and atraumatic. Sclera clear. EOMs intact. Neck: Supple. No carotid bruits. No JVD. Heart: *** RRR. Distinct S1 and S2. No murmurs, gallops, or rubs. Radial and distal pedal pulses 2+ and equal bilaterally. Lungs: No increased work of breathing.  Clear to ausculation bilaterally. No wheezes, rhonchi, or rales.  Abdomen: Soft, non-distended, and non-tender to palpation. Bowel sounds present in all 4 quadrants.  MSK: Normal strength and tone for age. *** Extremities: No lower extremity edema.    Skin: Warm and dry. Neuro: Alert and oriented x3. No focal deficits. Psych: Normal affect. Responds appropriately.   Assessment:    No diagnosis found.  Plan:     Disposition: Follow up in ***   Medication Adjustments/Labs and Tests Ordered: Current medicines are reviewed at length with the patient today.  Concerns regarding medicines are outlined above.  No orders of the defined types were placed in this encounter.  No orders of the defined types were placed in this encounter.   There are no Patient Instructions on file for this visit.   Leanne Lovely, PA-C  03/31/2023 9:50 PM    [facility]

## 2023-04-03 ENCOUNTER — Ambulatory Visit: Payer: Medicaid Other | Attending: Student | Admitting: Student

## 2023-04-04 ENCOUNTER — Encounter: Payer: Self-pay | Admitting: Student

## 2023-04-05 ENCOUNTER — Ambulatory Visit: Payer: Medicaid Other

## 2023-04-05 ENCOUNTER — Ambulatory Visit (HOSPITAL_COMMUNITY): Payer: Medicaid Other

## 2023-04-20 ENCOUNTER — Other Ambulatory Visit: Payer: Self-pay

## 2023-04-20 ENCOUNTER — Other Ambulatory Visit: Payer: Self-pay | Admitting: Critical Care Medicine

## 2023-04-20 MED ORDER — GABAPENTIN 100 MG PO CAPS
100.0000 mg | ORAL_CAPSULE | Freq: Two times a day (BID) | ORAL | 2 refills | Status: AC
  Filled 2023-04-20: qty 90, 45d supply, fill #0

## 2023-04-23 ENCOUNTER — Other Ambulatory Visit: Payer: Self-pay

## 2023-05-17 NOTE — Progress Notes (Signed)
Cardiology Office Note:    Date:  05/24/2023   ID:  Rodney Barber, DOB 05-Mar-1966, MRN [id number]  PCP:  [name], MD  Cardiologist:  [name], MD     Referring MD: [name], MD   Chief Complaint: follow-up of CHF and hypertension   History of Present Illness:    Rodney Barber is a 57 y.o. 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 [place]iland and presents today for follow-up of CHF and hypertension.    Patient was admitted in 12/2019 for new acute systolic CHF. Echo at that time showed LVEF of 35-40% with global hypokinesis, normal RV, and no significant valvular disease. He underwent LHC which showed diffuse coronary atherosclerosis with heavy calcification and 70% stenosis of mid RCA. Degree of LV dysfunction was felt to be out of proportion to the degree of CAD. Medical therapy was recommended and patient was started on GDMT. EF later normalized to 60-65% on repeat Echo in 04/2021. He was last seen by [name], PA-C, in 06/2021 at which time he reported bilateral leg pain and well as cold feet and occasional numbness/tingling concerning for claudication. ABIs were ordered and were normal bilaterally.    Patient was admitted from 12/02/2021 to 12/06/2021 for acute hypoxic respiratory failure secondary to community acquired pneumonia and COPD. Echo during this admission showed LVEF of 40-45% with apical and septal mid/ apical inferior wall hypokinesis.   ABIs in 03/2022 were suggestive of mild arterial disease bilaterally (0.9 on the right and 0.82 on the left).   He was seen by me twice in 12/2022  at which time he denied any cardiac symptoms but continued to report occasion cocaine use. BP was markedly elevated and GDMT was adjusted. Drop in EF was felt to likely be due to uncontrolled hypertension and ongoing cocaine use. Plan was to optimized GDMT and then repeat Echo.  Patient presents today for follow-up. Here alone. He has been doing well since the last visit in May. He denies experiencing chest pain, shortness of breath, orthopnea, PND, lower extremity edema, or palpitations. He reports occasional lightheadedness/ dizziness (usually right after taking his medications) but this resolves quickly. He denies any syncope.   He continues to smoke about 4 cigarettes per day but is interested in quitting. He request Nicotine patches. These were prescribed at his visit in May but he never picked them up. He states he has quit using cocaine and last used over 1 month ago.   He seems to be tolerating his medications well, except for the Gabapentin - he states this makes him feel like bugs are crawling on him. Asked him to talk with his PCP who prescribed this. Okay to stop from a cardiac standpoint.   EKGs/Labs/Other Studies Reviewed:    The following studies were reviewed:  Left Cardiac Catheterization 01/02/2020: Prox LAD to Dist LAD lesion is 30% stenosed. Mid Cx to Dist Cx lesion is 60% stenosed. Mid RCA lesion is 70% stenosed. There is severe left ventricular systolic dysfunction. LV end diastolic pressure is mildly elevated. The left ventricular ejection fraction is 25-35% by visual estimate.   1. Diffuse coronary atherosclerosis with heavy calcification. Modest single vessel obstructive CAD  involving the mid RCA 2. Severe LV dysfunction EF estimated at 25-30% 3. Elevated EDP 24 mm Hg   Plan: aggressive medical therapy for CHF. Will add aldactone to Coreg, lasix, and losartan. Titrate as BP allows. High dose statin therapy. LV dysfunction is out of proportion to the degree of CAD.    Diagnostic Dominance: Right    _______________   Echocardiogram 12/04/2021: Impressions:  1. Apical, septal mid/apical inferior wall hypokinesis . Left ventricular  ejection fraction, by estimation, is 40 to 45%. The left ventricle has  mildly decreased function. The left ventricle demonstrates regional wall  motion abnormalities (see scoring  diagram/findings for description). The left ventricular internal cavity  size was moderately dilated. There is mild left ventricular hypertrophy.  Left ventricular diastolic parameters were normal.   2. Right ventricular systolic function is normal. The right ventricular  size is normal.   3. The mitral valve is normal in structure. Trivial mitral valve  regurgitation. No evidence of mitral stenosis.   4. The aortic valve is tricuspid. There is mild calcification of the  aortic valve. There is mild thickening of the aortic valve. Aortic valve  regurgitation is trivial. Aortic valve sclerosis is present, with no  evidence of aortic valve stenosis.   5. The inferior vena cava is normal in size with <50% respiratory  variability, suggesting right atrial pressure of 8 mmHg.    _______________   ABIs/ TBIs 03/30/2022: Summary:  Right: Resting right ankle-brachial index indicates mild right lower  extremity arterial disease. The right toe-brachial index is normal.   Left: Resting left ankle-brachial index indicates mild left lower  extremity arterial disease. The left toe-brachial index is normal.     EKG:  EKG not ordered today.   Recent Labs: 11/08/2022: ALT 58; Hemoglobin 9.5; Platelets 122 12/13/2022: BUN 16; Creatinine, Ser 1.41; Potassium 3.2; Sodium 138  Recent Lipid Panel    Component Value Date/Time   CHOL 98 (L) 11/08/2022 1352   TRIG 91 11/08/2022 1352   HDL 43 11/08/2022 1352   CHOLHDL 2.3 11/08/2022 1352   CHOLHDL 3.7 01/01/2020 0550   VLDL 20 01/01/2020 0550   LDLCALC 37 11/08/2022 1352    Physical Exam:    Vital  Signs: BP (!) 150/60   Pulse (!) 59   Ht 5\' 8"  (1.727 m)   Wt 131 lb (59.4 kg)   SpO2 96%   BMI 19.92 kg/m     Wt Readings from Last 3 Encounters:  05/24/23 131 lb (59.4 kg)  12/26/22 127 lb (57.6 kg)  12/13/22 126 lb 12.8 oz (57.5 kg)     General: 57 y.o. thin African-American  male in no acute distress. HEENT: Normocephalic and atraumatic. Sclera clear.  Neck: Supple. No JVD. Heart: RRR. Distinct S1 and S2. No murmurs, gallops, or rubs.  Lungs: No increased work of breathing. Clear to ausculation bilaterally. No wheezes, rhonchi, or rales.  Extremities: No lower extremity edema.   Skin: Warm and dry. Neuro: No focal deficits. Psych: Normal affect. Responds appropriately.  Assessment:    1. Chronic combined systolic and diastolic CHF (congestive heart failure) (HCC)   2. Non-ischemic cardiomyopathy (HCC)   3. Coronary artery disease involving native coronary artery of native heart without angina pectoris   4. PAD (peripheral artery disease) (HCC)   5. Primary hypertension   6. Hyperlipidemia, unspecified hyperlipidemia type   7. Type 2 diabetes mellitus with complication, with long-term current use of insulin (HCC)   8. Stage 3 chronic kidney disease,  unspecified whether stage 3a or 3b CKD (HCC)   9. Hypokalemia   10. Tobacco abuse   11. Cocaine abuse (HCC)     Plan:    Chronic Combined CHF Non-Ischemic Cardiomyopathy Patient has a history of chronic combined CHF. Initially diagnosed in 12/2019 when found to have and EF of 30-35%. LHC at that time showed multivessel disease but degree of cardiomyopathy was felt to be out of proportion to degree of CAD. EF improved to 60-65% in 04/2021 after being started on GDMT. Last Echo in 11/2021 during a hospitalization for CAP showed LVEF of 40-45% with apical and septal mid/ apical inferior wall hypokinesis. - Euvolemic on exam. - Continue Valsartan 40mg  daily.  - Continue Coreg 12.5mg  twice daily. - Continue Spironolactone 25mg   daily. - Continue Farxiga 10mg  daily.  - Continue Imdur 30mg  daily.  - Suspect drop in EF may be due to uncontrolled hypertension and cocaine use. He has states he has quit using cocaine. Will repeat Echo to see if EF has improved with GDMT. If EF is still down and BP will allow (based on home BP logs), will likely switch to [facility]. May also need repeat ischemic evaluation if EF still down.    CAD LHC in 12/2019 showed showed  diffuse coronary atherosclerosis with heavy calcification and 70% stenosis of mid RCA. Medical therapy recommended. - No chest pain.  - Continue Coreg 12.5mg  twice daily and Imdur 30mg  daily.  - Continue aspirin and statin.    PAD ABIs in 05/2023 were suggestive of mild arterial disease on the right (0.73) and moderate disease on the left (0.55). - Continue aspirin and statin. - Followed by Vascular Surgery. Repeat ABIs are scheduled for 04/05/2023.    Hypertension BP mildly elevated in the office today. Initially 140/60 and then 150/60 on my personal recheck at the end of the visit. However, BP was 97/67 at [facility] office early this week - Continue medications for CHF as above. - Asked patient to keep a BP/HR log for 2 weeks and then send this to [place].   Hyperlipidemia Lipid panel in 11/2022: Total Cholesterol 98, Triglycerides 91, HDL 43, LDL 37. LDL goal <70 given CAD. - Continue Crestor 40mg  daily.    Type 2 Diabetes Mellitus Hemoglobin A1c 8.9% in 11/2022.  - On Insulin.  - Management per PCP.   CKD Stage III Baseline creatinine around 1.3 to 1.5. Stable at 1.41 on most recent labs on 12/13/2022.   Hypokalemia Potassium was mildly at 3.2 in 12/2022. Potassium was supplemented at that time and I ordered a repeat BMET at time but looks like this was never done.  - Will repeat BMET when he comes in for Echo in a couple of weeks (didn't realize he never came in for recheck until after he left).    Tobacco Abuse Cocaine Abuse Patient  continues to smoke 4 cigarettes per day but states he is no longer using cocaine (last used >1 month ago). - Discussed importance of complete cessation. - He requested Nicotine patches. These were provided at last visit in 12/2022 but he never picked them. Asked him to check with Pharmacy to see if they still have it on file. If not, will reorder.   Disposition: Follow up in 6 months.    Signed, Corrin Parker, PA-C  05/25/2023 4:32 AM    [facility]

## 2023-05-21 ENCOUNTER — Ambulatory Visit (INDEPENDENT_AMBULATORY_CARE_PROVIDER_SITE_OTHER): Payer: Medicaid Other | Admitting: Physician Assistant

## 2023-05-21 ENCOUNTER — Encounter: Payer: Self-pay | Admitting: Physician Assistant

## 2023-05-21 ENCOUNTER — Other Ambulatory Visit: Payer: Self-pay

## 2023-05-21 ENCOUNTER — Ambulatory Visit (HOSPITAL_COMMUNITY)
Admission: RE | Admit: 2023-05-21 | Discharge: 2023-05-21 | Disposition: A | Discharge status: Home / Self Care | Payer: Medicaid Other | Source: Ambulatory Visit | Attending: Physician Assistant | Admitting: Physician Assistant

## 2023-05-21 VITALS — BP 97/57 | HR 65 | Temp 98.1°F | Resp 18

## 2023-05-21 DIAGNOSIS — F17200 Nicotine dependence, unspecified, uncomplicated: Secondary | ICD-10-CM

## 2023-05-21 DIAGNOSIS — I739 Peripheral vascular disease, unspecified: Secondary | ICD-10-CM | POA: Diagnosis not present

## 2023-05-21 DIAGNOSIS — R6889 Other general symptoms and signs: Secondary | ICD-10-CM

## 2023-05-21 LAB — VAS US ABI WITH/WO TBI
Left ABI: 0.55
Right ABI: 0.81

## 2023-05-21 NOTE — Progress Notes (Signed)
HISTORY AND PHYSICAL     CC:  follow up. Requesting Provider:  [name], MD  HPI: This is a 57 y.o. male who is here today for follow up for PAD.  He was seen last year by Dr. Edilia Bo for claudication.  He did not have any rest pain or non healing ulcers.  His risk factors for peripheral arterial disease include diabetes, hypertension, hypercholesterolemia, a family history of premature cardiovascular disease, and tobacco use.  At that time, he was smoking about 1/2-1/3 ppd of cigarettes and had smoked about 30 years.   His ABI on the right was 92% with toe pressure of 112 and on the left 82% with toe pressure of 110.    Dr. Edilia Bo discussed with him the importance of smoking and increased walking to develop collateral flow.  He was scheduled for one year follow up.    The pt returns today for follow up.  He states that his left leg is cramping with walking.  He states he can walk about 75 yards before it starts and improves with rest.  He denies any pain in his feet at rest or any non healing wounds.  He is diabetic and continues to smoke but has cut back.  He is compliant with his asa and statin.  He has not had any neurological symptoms.  He has family hx of CAD with his brother and father.   The pt is on a statin for cholesterol management.    The pt is on an aspirin.    Other AC:  none The pt is on BB, ARB for hypertension.  The pt is  on medication for diabetes. Tobacco hx:  current  Pt does not have family hx of AAA.  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   Anemia    Diabetes mellitus    Hypertension    Pancreatitis    Substance use disorder 02/18/2019    Past Surgical History:  Procedure Laterality Date   ERCP  06/27/2011   Procedure: ENDOSCOPIC RETROGRADE CHOLANGIOPANCREATOGRAPHY (ERCP);  Surgeon: Petra Kuba, MD;  Location: [facility];  Service: Endoscopy;  Laterality: N/A;   ERCP W/ PLASTIC STENT  PLACEMENT  03/2011   IR US GUIDE BX ASP/DRAIN  05/16/2019   LEFT HEART CATH AND CORONARY ANGIOGRAPHY N/A 01/02/2020   Procedure: LEFT HEART CATH AND CORONARY ANGIOGRAPHY;  Surgeon: Swaziland, Peter M, MD;  Location: [facility] INVASIVE CV LAB;  Service: Cardiovascular;  Laterality: N/A;   TEE WITHOUT CARDIOVERSION N/A 05/20/2019   Procedure: TRANSESOPHAGEAL ECHOCARDIOGRAM (TEE);  Surgeon: Chrystie Nose, MD;  Location: [facility] ENDOSCOPY;  Service: Cardiovascular;  Laterality: N/A;    No Known Allergies  Current Outpatient Medications  Medication Sig Dispense Refill   Accu-Chek Softclix Lancets lancets Ues to check blood sugar 4 (four) times daily. 100 each 2   acetaminophen (TYLENOL) 325 MG tablet Take 2 tablets (650 mg total) by mouth every 6 (six) hours as needed for mild pain.     aspirin 81 MG chewable tablet Chew 1 tablet (81 mg total) by mouth daily.     Blood Glucose Monitoring Suppl (ACCU-CHEK GUIDE) w/Device KIT Use to check blood sugar four times daily. E11.65 1 kit 0   Blood Pressure Monitoring (BLOOD PRESSURE KIT) DEVI Use to measure blood pressure 1 each 0   carvedilol (COREG) 12.5 MG tablet Take 1 tablet (12.5 mg total) by mouth 2 (two) times daily with a meal. 120 tablet  6   dapagliflozin propanediol (FARXIGA) 10 MG TABS tablet Take 1 tablet (10 mg total) by mouth daily before breakfast. (Patient not taking: Reported on 12/26/2022) 90 tablet 3   dapagliflozin propanediol (FARXIGA) 10 MG TABS tablet Take 1 tablet (10 mg total) by mouth daily before breakfast. 14 tablet 0   feeding supplement, GLUCERNA SHAKE, (GLUCERNA SHAKE) LIQD Take 237 mLs by mouth 3 (three) times daily between meals.  0   ferrous sulfate (FEROSUL) 325 (65 FE) MG tablet Take 1 tablet (325 mg total) by mouth 2 (two) times daily with a meal. 60 tablet 3   gabapentin (NEURONTIN) 100 MG capsule Take 1 capsule (100 mg total) by mouth 2 (two) times daily. 90 capsule 2   Glucose 4-6 GM-MG CHEW Chew one as needed for blood glucose < 70  (Patient taking differently: Chew 1 tablet by mouth 2 (two) times daily as needed (low glucose). Chew one as needed for blood glucose < 70) 100 tablet 0   glucose blood (ACCU-CHEK GUIDE) test strip Use to check blood sugar four times daily. 100 each 12   insulin glargine (LANTUS) 100 UNIT/ML Solostar Pen Inject 25 Units into the skin daily. 30 mL 2   insulin lispro (HUMALOG) 100 UNIT/ML KwikPen Inject 5 Units into the skin 2 (two) times daily before a meal. Skip your dose if you do not eat or if your sugar is <150. 3 mL 1   Insulin Pen Needle 31G X 5 MM MISC USE WITH INSULIN PENS 100 each 6   isosorbide mononitrate (IMDUR) 30 MG 24 hr tablet Take 1 tablet (30 mg total) by mouth daily. 90 tablet 3   nicotine (NICODERM CQ - DOSED IN MG/24 HOURS) 14 mg/24hr patch Place 1 patch (14 mg total) onto the skin daily. (Patient not taking: Reported on 12/26/2022) 28 patch 0   nicotine (NICODERM CQ - DOSED IN MG/24 HR) 7 mg/24hr patch Place 1 patch (7 mg total) onto the skin daily. (Patient not taking: Reported on 12/26/2022) 28 patch 0   nitroGLYCERIN (NITROSTAT) 0.4 MG SL tablet Place 1 tablet (0.4 mg total) under the tongue every 5 (five) minutes as needed for chest pain. 100 tablet 3   Pancrelipase, Lip-Prot-Amyl, (CREON) 6000-19000 units CPEP Take 2 capsules (12,000 Units total) by mouth in the morning AND 2 capsules (12,000 Units total) daily at 12 noon AND 2 capsules (12,000 Units total) at bedtime. 180 capsule 6   pantoprazole (PROTONIX) 40 MG tablet Take 1 tablet (40 mg total) by mouth daily. 90 tablet 3   potassium chloride SA (KLOR-CON M) 20 MEQ tablet Take 2 tablets (40 mEq total) by mouth once for 1 dose. 10 tablet 0   rosuvastatin (CRESTOR) 40 MG tablet Take 1 tablet (40 mg total) by mouth daily. 90 tablet 3   spironolactone (ALDACTONE) 25 MG tablet Take 1 tablet (25 mg total) by mouth daily. 90 tablet 3   valsartan (DIOVAN) 40 MG tablet Take 1 tablet (40 mg total) by mouth daily. 90 tablet 1   No  current facility-administered medications for this visit.    Family History  Problem Relation Age of Onset   Diabetes Mother    Diabetes Brother     Social History   Socioeconomic History   Marital status: Single    Spouse name: Not on file   Number of children: Not on file   Years of education: Not on file   Highest education level: Not on file  Occupational History  Not on file  Tobacco Use   Smoking status: Every Day    Current packs/day: 0.50    Average packs/day: 0.5 packs/day for 30.0 years (15.0 ttl pk-yrs)    Types: Cigarettes   Smokeless tobacco: Never   Tobacco comments:    6 cigarett /day  Vaping Use   Vaping status: Never Used  Substance and Sexual Activity   Alcohol use: No   Drug use: Not Currently    Types: "Crack" cocaine, Other-see comments, Cocaine    Comment: last smoked crack 12-16-16   Sexual activity: Never  Other Topics Concern   Not on file  Social History Narrative   Not on file   Social Determinants of Health   Financial Resource Strain: Not on file  Food Insecurity: Not on file  Transportation Needs: Not on file  Physical Activity: Not on file  Stress: Not on file  Social Connections: Not on file  Intimate Partner Violence: Not on file     REVIEW OF SYSTEMS:   [X]  denotes positive finding, [ ]  denotes negative finding Cardiac  Comments:  Chest pain or chest pressure:    Shortness of breath upon exertion:    Short of breath when lying flat:    Irregular heart rhythm:        Vascular    Pain in calf, thigh, or hip brought on by ambulation: x   Pain in feet at night that wakes you up from your sleep:     Blood clot in your veins:    Leg swelling:         Pulmonary    Oxygen at home:    Productive cough:     Wheezing:         Neurologic    Sudden weakness in arms or legs:     Sudden numbness in arms or legs:     Sudden onset of difficulty speaking or slurred speech:    Temporary loss of vision in one eye:     Problems  with dizziness:         Gastrointestinal    Blood in stool:     Vomited blood:         Genitourinary    Burning when urinating:     Blood in urine:        Psychiatric    Major depression:         Hematologic    Bleeding problems:    Problems with blood clotting too easily:        Skin    Rashes or ulcers:        Constitutional    Fever or chills:      PHYSICAL EXAMINATION:  Today's Vitals   05/21/23 0924  BP: (!) 97/57  Pulse: 65  Resp: 18  Temp: 98.1 F (36.7 C)  TempSrc: Temporal  SpO2: 97%  PainSc: 0-No pain   There is no height or weight on file to calculate BMI.   General:  WDWN in NAD; vital signs documented above Gait: Not observed HENT: WNL, normocephalic Pulmonary: normal non-labored breathing , without wheezing Cardiac: regular HR, without carotid bruits Abdomen: soft, NT; aortic pulse is not palpable Skin: without rashes Vascular Exam/Pulses:  Right Left  Radial 2+ (normal) 2+ (normal)  Femoral 2+ (normal) 2+ (normal)  DP 1+ (weak) Unable to palpate or doppler  PT Brisk doppler flow Brisk doppler flow  Peroneal Not examined Unable to doppler   Extremities: without ischemic changes, without Gangrene ,  without cellulitis; without open wounds Musculoskeletal: no muscle wasting or atrophy  Neurologic: A&O X 3 Psychiatric:  The pt has Normal affect.   Non-Invasive Vascular Imaging:   ABI's/TBI's on 05/21/2023: Right:  0.81/0.66 - Great toe pressure: 110 Left:  0.55/0.34 - Great toe pressure: 56  Previous ABI's/TBI's on 03/30/2022: Right:  0.92/0.73 - Great toe pressure: 112 Left:  0.82/0.71 - Great toe pressure:  110    ASSESSMENT/PLAN:: 57 y.o. male here for follow up for PAD   PAD with LLE claudication -pt has left leg claudication and does not have any rest pain or non healing wounds.  He did have a drop in his ABI and toe pressure on the left from a year ago.   -encouraged him to continue graduated walking program to build up  collateral flow.   -continue asa/statin -pt will f/u in 6 months with ABI.  Current smoker -pt continuing to smoke.  Discussed the importance of quitting smoking especially in light that he is diabetic and continued smoking puts him at higher risk for limb loss, heart attack, strokes, cancer.  He is going to work on quitting.   Doreatha Massed, [facility] [phone]  Clinic MD:   Hetty Blend

## 2023-05-24 ENCOUNTER — Encounter: Payer: Self-pay | Admitting: Student

## 2023-05-24 ENCOUNTER — Ambulatory Visit: Payer: Medicaid Other | Attending: Student | Admitting: Student

## 2023-05-24 VITALS — BP 150/60 | HR 59 | Ht 68.0 in | Wt 131.0 lb

## 2023-05-24 DIAGNOSIS — Z720 Tobacco use: Secondary | ICD-10-CM

## 2023-05-24 DIAGNOSIS — F1410 Cocaine abuse, uncomplicated: Secondary | ICD-10-CM

## 2023-05-24 DIAGNOSIS — I739 Peripheral vascular disease, unspecified: Secondary | ICD-10-CM

## 2023-05-24 DIAGNOSIS — I5042 Chronic combined systolic (congestive) and diastolic (congestive) heart failure: Secondary | ICD-10-CM

## 2023-05-24 DIAGNOSIS — E876 Hypokalemia: Secondary | ICD-10-CM

## 2023-05-24 DIAGNOSIS — I10 Essential (primary) hypertension: Secondary | ICD-10-CM

## 2023-05-24 DIAGNOSIS — I428 Other cardiomyopathies: Secondary | ICD-10-CM

## 2023-05-24 DIAGNOSIS — I2510 Atherosclerotic heart disease of native coronary artery without angina pectoris: Secondary | ICD-10-CM | POA: Diagnosis not present

## 2023-05-24 DIAGNOSIS — N183 Chronic kidney disease, stage 3 unspecified: Secondary | ICD-10-CM

## 2023-05-24 DIAGNOSIS — E118 Type 2 diabetes mellitus with unspecified complications: Secondary | ICD-10-CM

## 2023-05-24 DIAGNOSIS — E785 Hyperlipidemia, unspecified: Secondary | ICD-10-CM

## 2023-05-24 DIAGNOSIS — Z794 Long term (current) use of insulin: Secondary | ICD-10-CM

## 2023-05-24 NOTE — Patient Instructions (Addendum)
Medication Instructions:  No medication changes.  Check with your pharmacy regarding the nicotine patch. *If you need a refill on your cardiac medications before your next appointment, please call your pharmacy*   Lab Work: BMET If you have labs (blood work) drawn today and your tests are completely normal, you will receive your results only by: MyChart Message (if you have MyChart) OR A paper copy in the mail If you have any lab test that is abnormal or we need to change your treatment, we will call you to review the results.   Testing/Procedures: Your physician has requested that you have an echocardiogram. Echocardiography is a painless test that uses sound waves to create images of your heart. It provides your doctor with information about the size and shape of your heart and how well your heart's chambers and valves are working. This procedure takes approximately one hour. There are no restrictions for this procedure. Please do NOT wear cologne, perfume, aftershave, or lotions (deodorant is allowed). Please arrive 15 minutes prior to your appointment time.    Follow-Up: At [facility], you and your health needs are our priority.  As part of our continuing mission to provide you with exceptional heart care, we have created designated Provider Care Teams.  These Care Teams include your primary Cardiologist (physician) and Advanced Practice Providers (APPs -  Physician Assistants and Nurse Practitioners) who all work together to provide you with the care you need, when you need it.  We recommend signing up for the patient portal called "MyChart".  Sign up information is provided on this After Visit Summary.  MyChart is used to connect with patients for Virtual Visits (Telemedicine).  Patients are able to view lab/test results, encounter notes, upcoming appointments, etc.  Non-urgent messages can be sent to your provider as well.   To learn more about what you can do with MyChart,  go to [email or web address].    Your next appointment:   6 month(s)  Provider:   [name], MD     Other Instructions  Heart Failure Education: Weigh yourself EVERY morning after you go to the bathroom but before you eat or drink anything. Write this number down in a weight log/diary. If you gain 3 pounds overnight or 5 pounds in a week, call the office. Take your medicines as prescribed. If you have concerns about your medications, please call us before you stop taking them.  Eat low salt foods--Limit salt (sodium) to 2000 mg per day. This will help prevent your body from holding onto fluid. Read food labels as many processed foods have a lot of sodium, especially canned goods and prepackaged meats. If you would like some assistance choosing low sodium foods, we would be happy to set you up with a nutritionist. Limit all fluids for the day to less than 2 liters (64 ounces). Fluid includes all drinks, coffee, juice, ice chips, soup, jello, and all other liquids. Stay as active as you can everyday. Staying active will give you more energy and make your muscles stronger. Start with 5 minutes at a time and work your way up to 30 minutes a day. Break up your activities--do some in the morning and some in the afternoon. Start with 3 days per week and work your way up to 5 days as you can.  If you have chest pain, feel short of breath, dizzy, or lightheaded, STOP. If you don't feel better after a short rest, call 911. If you do feel better, call  the office to let us know you have symptoms with exercise.

## 2023-05-25 ENCOUNTER — Encounter: Payer: Self-pay | Admitting: Student

## 2023-05-31 ENCOUNTER — Ambulatory Visit: Payer: Self-pay

## 2023-05-31 NOTE — Telephone Encounter (Signed)
Chief Complaint: Rash Symptoms: Small/larger flesh colored bumps on arms, legs, chest, back, severe itching Frequency: 2 months onset Pertinent Negatives: Patient denies other symptoms Disposition: [] ED /[] Urgent Care (no appt availability in office) / [x] Appointment(In office/virtual)/ []  [facility]/ [] Home Care/ [] Refused Recommended Disposition /[] [facility]/ []  Follow-up with PCP Additional Notes: Patient says he's been using cream for eczema that he has at home and will need a refill on it. He also says he stopped taking gabapentin because it feels like something is crawling on him when he took it. Advised OV is needed, scheduled Saturday with Jomarie Longs.      Summary: rash on body/medication reaction   Patient called stated he is experiencing itchy rash on his legs, arms, back and chest. When he scratch a lot they start to hurt. Some of the bumps has fluid while others are patches of bumps. He has been taking gabapentin (NEURONTIN) 100 MG capsule for nerves and it feels like something is crawling on him so he wants to stop taking the medication. Please f/u with patient         Reason for Disposition  SEVERE itching (i.e., interferes with sleep, normal activities or school)  Answer Assessment - Initial Assessment Questions 1. APPEARANCE of RASH: "Describe the rash." (e.g., spots, blisters, raised areas, skin peeling, scaly)     Some small, some are larger 2. LOCATION: "Where is the rash located?"     Legs, arms, chest, back 3. COLOR: "What color is the rash?" (Note: It is difficult to assess rash color in people with darker-colored skin. When this situation occurs, simply ask the caller to describe what they see.)     Skin colored unless scratched 4. ONSET: "When did the rash begin?"     2 months ago 5. ITCHING: "Does the rash itch?" If Yes, ask: "How bad is the itch?" (Scale 1-10; or mild, moderate, severe)     Severe 6. CAUSE: "What do you think is causing  the rash?"     Eczema  7. OTHER SYMPTOMS: "Do you have any other symptoms?" (e.g., dizziness, headache, sore throat, joint pain)       No  Protocols used: Rash or Redness - [facility]

## 2023-06-02 ENCOUNTER — Ambulatory Visit: Payer: Medicaid Other | Attending: Family | Admitting: Primary Care

## 2023-06-02 ENCOUNTER — Ambulatory Visit: Payer: Medicaid Other

## 2023-06-02 VITALS — BP 119/57 | HR 62 | Wt 126.4 lb

## 2023-06-02 DIAGNOSIS — Z794 Long term (current) use of insulin: Secondary | ICD-10-CM | POA: Diagnosis not present

## 2023-06-02 DIAGNOSIS — F17210 Nicotine dependence, cigarettes, uncomplicated: Secondary | ICD-10-CM | POA: Diagnosis not present

## 2023-06-02 DIAGNOSIS — E1165 Type 2 diabetes mellitus with hyperglycemia: Secondary | ICD-10-CM

## 2023-06-02 DIAGNOSIS — R636 Underweight: Secondary | ICD-10-CM | POA: Diagnosis not present

## 2023-06-02 DIAGNOSIS — Z7984 Long term (current) use of oral hypoglycemic drugs: Secondary | ICD-10-CM

## 2023-06-02 LAB — POCT GLYCOSYLATED HEMOGLOBIN (HGB A1C): HbA1c, POC (controlled diabetic range): 9.5 % — AB (ref 0.0–7.0)

## 2023-06-02 MED ORDER — INSULIN GLARGINE 100 UNIT/ML SOLOSTAR PEN
25.0000 [IU] | PEN_INJECTOR | Freq: Every day | SUBCUTANEOUS | 2 refills | Status: AC
  Filled 2023-06-02: qty 21, 84d supply, fill #0

## 2023-06-02 MED ORDER — INSULIN LISPRO (1 UNIT DIAL) 100 UNIT/ML (KWIKPEN)
8.0000 [IU] | PEN_INJECTOR | Freq: Three times a day (TID) | SUBCUTANEOUS | 1 refills | Status: AC
  Filled 2023-06-02 – 2023-07-20 (×2): qty 3, 13d supply, fill #0
  Filled 2023-10-26: qty 3, 13d supply, fill #1

## 2023-06-02 MED ORDER — DAPAGLIFLOZIN PROPANEDIOL 10 MG PO TABS
10.0000 mg | ORAL_TABLET | Freq: Every day | ORAL | 3 refills | Status: AC
  Filled 2023-06-02: qty 90, 90d supply, fill #0

## 2023-06-02 NOTE — Progress Notes (Signed)
Subjective:  Patient ID: Faraz Pittsenbarger, male    DOB: 10-11-1965  Age: 57 y.o. MRN: [id number]  CC: Eczema   HPI Trapper Going presents forFollow-up of diabetes. Patient does  check blood sugar at home  Compliant with meds - No Checking CBGs?  [id number]  Fasting avg -   Postprandial average -  Exercising regularly? - No Watching carbohydrate intake? - No Neuropathy ? - No Hypoglycemic events - No  - Recovers with :   Pertinent ROS:  Polyuria - No Polydipsia - No Vision problems - No  Medications as noted below. Taking them regularly without complication/adverse reaction being reported today.   History Alexios has a past medical history of Alcohol-induced chronic pancreatitis (HCC) (07/22/2013), Anemia, Diabetes mellitus, Hypertension, Pancreatitis, and Substance use disorder (02/18/2019).   He has a past surgical history that includes ERCP w/ plastic stent placement (03/2011); ERCP (06/27/2011); IR US Guide Bx Asp/Drain (05/16/2019); TEE without cardioversion (N/A, 05/20/2019); and LEFT HEART CATH AND CORONARY ANGIOGRAPHY (N/A, 01/02/2020).   His family history includes Diabetes in his brother and mother.He reports that he has been smoking cigarettes. He has a 15 pack-year smoking history. He has never used smokeless tobacco. He reports that he does not currently use drugs after having used the following drugs: "Crack" cocaine, Other-see comments, and Cocaine. He reports that he does not drink alcohol.  Current Outpatient Medications on File Prior to Visit  Medication Sig Dispense Refill   Accu-Chek Softclix Lancets lancets Ues to check blood sugar 4 (four) times daily. 100 each 2   acetaminophen (TYLENOL) 325 MG tablet Take 2 tablets (650 mg total) by mouth every 6 (six) hours as needed for mild pain.     aspirin 81 MG chewable tablet Chew 1 tablet (81 mg total) by mouth daily.     Blood Glucose Monitoring Suppl (ACCU-CHEK GUIDE) w/Device KIT Use to check blood sugar four times  daily. E11.65 1 kit 0   Blood Pressure Monitoring (BLOOD PRESSURE KIT) DEVI Use to measure blood pressure 1 each 0   carvedilol (COREG) 12.5 MG tablet Take 1 tablet (12.5 mg total) by mouth 2 (two) times daily with a meal. 120 tablet 6   feeding supplement, GLUCERNA SHAKE, (GLUCERNA SHAKE) LIQD Take 237 mLs by mouth 3 (three) times daily between meals.  0   ferrous sulfate (FEROSUL) 325 (65 FE) MG tablet Take 1 tablet (325 mg total) by mouth 2 (two) times daily with a meal. 60 tablet 3   gabapentin (NEURONTIN) 100 MG capsule Take 1 capsule (100 mg total) by mouth 2 (two) times daily. 90 capsule 2   Glucose 4-6 GM-MG CHEW Chew one as needed for blood glucose < 70 (Patient taking differently: Chew 1 tablet by mouth 2 (two) times daily as needed (low glucose). Chew one as needed for blood glucose < 70) 100 tablet 0   glucose blood (ACCU-CHEK GUIDE) test strip Use to check blood sugar four times daily. 100 each 12   Insulin Pen Needle 31G X 5 MM MISC USE WITH INSULIN PENS 100 each 6   isosorbide mononitrate (IMDUR) 30 MG 24 hr tablet Take 1 tablet (30 mg total) by mouth daily. 90 tablet 3   nicotine (NICODERM CQ - DOSED IN MG/24 HR) 7 mg/24hr patch Place 1 patch (7 mg total) onto the skin daily. 28 patch 0   nitroGLYCERIN (NITROSTAT) 0.4 MG SL tablet Place 1 tablet (0.4 mg total) under the tongue every 5 (five) minutes as needed for chest pain.  100 tablet 3   Pancrelipase, Lip-Prot-Amyl, (CREON) 6000-19000 units CPEP Take 2 capsules (12,000 Units total) by mouth in the morning AND 2 capsules (12,000 Units total) daily at 12 noon AND 2 capsules (12,000 Units total) at bedtime. 180 capsule 6   pantoprazole (PROTONIX) 40 MG tablet Take 1 tablet (40 mg total) by mouth daily. 90 tablet 3   rosuvastatin (CRESTOR) 40 MG tablet Take 1 tablet (40 mg total) by mouth daily. 90 tablet 3   spironolactone (ALDACTONE) 25 MG tablet Take 1 tablet (25 mg total) by mouth daily. 90 tablet 3   valsartan (DIOVAN) 40 MG tablet  Take 1 tablet (40 mg total) by mouth daily. 90 tablet 1   potassium chloride SA (KLOR-CON M) 20 MEQ tablet Take 2 tablets (40 mEq total) by mouth once for 1 dose. 10 tablet 0   No current facility-administered medications on file prior to visit.    ROS Kory was seen today for eczema.  Diagnoses and all orders for this visit:  Uncontrolled type 2 diabetes mellitus with hyperglycemia (HCC) -     POCT glycosylated hemoglobin (Hb A1C)  Complications from uncontrolled diabetes -diabetic retinopathy leading to blindness, diabetic nephropathy leading to dialysis, decrease in circulation decrease in sores or wound healing which may lead to amputations and increase of heart attack and stroke   Other orders -     insulin glargine (LANTUS) 100 UNIT/ML Solostar Pen; Inject 25 Units into the skin daily. -     dapagliflozin propanediol (FARXIGA) 10 MG TABS tablet; Take 1 tablet (10 mg total) by mouth daily before breakfast. -     insulin lispro (HUMALOG) 100 UNIT/ML KwikPen; Inject 8 Units into the skin 3 (three) times daily. Skip your dose if you do not eat or if your sugar is <150.     Objective:  BP (!) 119/57 (BP Location: Left Arm, Patient Position: Sitting, Cuff Size: Normal)   Pulse 62   Wt 126 lb 6.4 oz (57.3 kg)   SpO2 99%   BMI 19.22 kg/m   BP Readings from Last 3 Encounters:  06/02/23 (!) 119/57  05/24/23 (!) 150/60  05/21/23 (!) 97/57    Wt Readings from Last 3 Encounters:  06/02/23 126 lb 6.4 oz (57.3 kg)  05/24/23 131 lb (59.4 kg)  12/26/22 127 lb (57.6 kg)    Physical Exam Constitutional:      Comments: Under weight   HENT:     Right Ear: External ear normal.     Left Ear: External ear normal.     Nose: Nose normal.  Eyes:     Extraocular Movements: Extraocular movements intact.  Cardiovascular:     Rate and Rhythm: Normal rate and regular rhythm.  Pulmonary:     Effort: Pulmonary effort is normal.     Breath sounds: Normal breath sounds.  Abdominal:      General: Abdomen is flat.     Palpations: Abdomen is soft.  Musculoskeletal:        General: Normal range of motion.  Skin:    General: Skin is warm and dry.  Neurological:     Mental Status: He is alert and oriented to person, place, and time.  Psychiatric:        Mood and Affect: Mood normal.        Behavior: Behavior normal.   Lab Results  Component Value Date   HGBA1C 9.5 (A) 06/02/2023   HGBA1C 8.9 (A) 11/08/2022   HGBA1C 8.0 (A) 08/02/2022  Lab Results  Component Value Date   WBC 6.5 11/08/2022   HGB 9.5 (L) 11/08/2022   HCT 29.8 (L) 11/08/2022   PLT 122 (L) 11/08/2022   GLUCOSE 286 (H) 12/13/2022   CHOL 98 (L) 11/08/2022   TRIG 91 11/08/2022   HDL 43 11/08/2022   LDLCALC 37 11/08/2022   ALT 58 (H) 11/08/2022   AST 86 (H) 11/08/2022   NA 138 12/13/2022   K 3.2 (L) 12/13/2022   CL 104 12/13/2022   CREATININE 1.41 (H) 12/13/2022   BUN 16 12/13/2022   CO2 20 12/13/2022   TSH 2.097 10/21/2019   INR 0.9 11/14/2019   HGBA1C 9.5 (A) 06/02/2023   MICROALBUR 150 03/15/2018     Assessment & Plan:   Keavin was seen today for eczema.  Diagnoses and all orders for this visit:  Uncontrolled type 2 diabetes mellitus with hyperglycemia (HCC) -     POCT glycosylated hemoglobin (Hb A1C)  Other orders -     insulin glargine (LANTUS) 100 UNIT/ML Solostar Pen; Inject 25 Units into the skin daily. -     dapagliflozin propanediol (FARXIGA) 10 MG TABS tablet; Take 1 tablet (10 mg total) by mouth daily before breakfast. -     insulin lispro (HUMALOG) 100 UNIT/ML KwikPen; Inject 8 Units into the skin 3 (three) times daily. Skip your dose if you do not eat or if your sugar is <150.   I have changed Onalee Hua Theilen's insulin lispro. I am also having him maintain his feeding supplement (GLUCERNA SHAKE), Glucose, nitroGLYCERIN, Accu-Chek Guide, acetaminophen, Blood Pressure Kit, aspirin, Accu-Chek Softclix Lancets, carvedilol, ferrous sulfate, Accu-Chek Guide, Insulin Pen  Needle, isosorbide mononitrate, pantoprazole, rosuvastatin, spironolactone, nicotine, potassium chloride SA, Creon, valsartan, gabapentin, insulin glargine, and dapagliflozin propanediol.  Meds ordered this encounter  Medications   insulin glargine (LANTUS) 100 UNIT/ML Solostar Pen    Sig: Inject 25 Units into the skin daily.    Dispense:  30 mL    Refill:  2    Order Specific Question:   Supervising Provider    Answer:   Quentin Angst [[id number]]   dapagliflozin propanediol (FARXIGA) 10 MG TABS tablet    Sig: Take 1 tablet (10 mg total) by mouth daily before breakfast.    Dispense:  90 tablet    Refill:  3    Order Specific Question:   Supervising Provider    Answer:   Quentin Angst [[id number]]   insulin lispro (HUMALOG) 100 UNIT/ML KwikPen    Sig: Inject 8 Units into the skin 3 (three) times daily. Skip your dose if you do not eat or if your sugar is <150.    Dispense:  3 mL    Refill:  1    Order Specific Question:   Supervising Provider    Answer:   Quentin Angst [[id number]]     Follow-up:  Franky Macho first avail  The above assessment and management plan was discussed with the patient. The patient verbalized understanding of and has agreed to the management plan. Patient is aware to call the clinic if symptoms fail to improve or worsen. Patient is aware when to return to the clinic for a follow-up visit. Patient educated on when it is appropriate to go to the emergency department.   Gwinda Passe, NP-C

## 2023-06-04 ENCOUNTER — Encounter: Payer: Self-pay | Admitting: Pharmacist

## 2023-06-04 ENCOUNTER — Other Ambulatory Visit: Payer: Self-pay

## 2023-06-04 ENCOUNTER — Ambulatory Visit: Payer: Medicaid Other | Attending: Family Medicine | Admitting: Pharmacist

## 2023-06-04 DIAGNOSIS — E1165 Type 2 diabetes mellitus with hyperglycemia: Secondary | ICD-10-CM | POA: Diagnosis not present

## 2023-06-04 DIAGNOSIS — Z794 Long term (current) use of insulin: Secondary | ICD-10-CM

## 2023-06-04 DIAGNOSIS — Z7984 Long term (current) use of oral hypoglycemic drugs: Secondary | ICD-10-CM | POA: Diagnosis not present

## 2023-06-04 DIAGNOSIS — K861 Other chronic pancreatitis: Secondary | ICD-10-CM

## 2023-06-04 MED ORDER — DEXCOM G7 RECEIVER DEVI
0 refills | Status: AC
  Filled 2023-06-04: qty 1, 365d supply, fill #0

## 2023-06-04 MED ORDER — DEXCOM G7 SENSOR MISC
6 refills | Status: AC
  Filled 2023-06-04: qty 3, 30d supply, fill #0

## 2023-06-04 NOTE — Progress Notes (Signed)
S:     No chief complaint on file.  57 y.o. male who presents for diabetes evaluation, education, and management.    Patient was last seen by Primary Care Provider, Dr. [name], on 11/08/22. Patient was seen and referred by Gwinda Passe on 10/26.   PMH is significant for type 2 diabetes, CHF, PAD, CAD, HTN, alcohol induced chronic pancreatitis, tobacco use disorder.   At last visit with [facility], A1c was 9.5%. His Humalog dose was increased from 5 to 8 units three times a day. Other medications were continued.   Today patient presents to clinic in okay spirits. Reports skin rash and itching on his arms, back, and chest. Notes it feels like his skin is crawling. Patient reported Marcelino Duster instructed him to take Benadryl at visit a couple days ago, but this did not help at all. Wonders if skin issues could be related to gabapentin, but reports sx started before he took gabapentin and have continued despite discontinuing gabapentin. Reports he is taking Lantus, Humalog, and Farxiga as prescribed. Denies missed doses. Not checking BG at home, but reports he plans to start today.  Family/Social History:  Mother- diabetes  Current diabetes medications include: Lantus 25 units daily, Humalog 8 units TID (hold if BG<150), Farxiga 10 mg daily, Current hyperlipidemia medications include: rosuvastatin 40 mg daily  Patient reports adherence to taking all medications as prescribed.   Insurance coverage: [place] Medicaid  Patient denies hypoglycemic events.  Reported home fasting blood sugars: not checking  Reported 2 hour post-meal/random blood sugars: not checking  Patient denies nocturia (nighttime urination).  Patient denies neuropathy (nerve pain). Patient denies visual changes. Patient reports self foot exams.   Patient-reported exercise habits: limited by mobility issues   O:   ROS  Physical Exam  Lab Results  Component Value Date   HGBA1C 9.5 (A) 06/02/2023   There were no  vitals filed for this visit.  Lipid Panel     Component Value Date/Time   CHOL 98 (L) 11/08/2022 1352   TRIG 91 11/08/2022 1352   HDL 43 11/08/2022 1352   CHOLHDL 2.3 11/08/2022 1352   CHOLHDL 3.7 01/01/2020 0550   VLDL 20 01/01/2020 0550   LDLCALC 37 11/08/2022 1352    Clinical Atherosclerotic Cardiovascular Disease (ASCVD): Yes  The ASCVD Risk score (Arnett DK, et al., 2019) failed to calculate for the following reasons:   The valid total cholesterol range is 130 to 320 mg/dL   Patient is participating in a Managed Medicaid Plan: No  A/P: Diabetes longstanding currently uncontrolled based on last A1c 9.5%. Patient is able to verbalize appropriate hypoglycemia management plan. Medication adherence appears appropriate. Control is suboptimal due to need for additional therapy. Patient just increased Humalog 2 days ago after visit with Marcelino Duster on 10/26. Has not been checking BG so unable to assess control. Will not make any medication adjustments today. Patient would benefit from a CGM which will help [place] assess his BG control and allow for easier medication adjustments. Discussed referral to endocrinology as his diabetes is primarily managed with basal/bolus insulin and his low body weight. He was amenable to this. Recommended he follow back up with Marcelino Duster for skin itching and rash to let her know Benadryl did not help. -Continued basal insulin Lantus 25 units daily.  -Continued rapid insulin Humalog (insulin lispro) 8 units three times a day with meals. -Continued SGLT2-I Farxiga (dapagliflozin) 10 mg daily. -Start Dexcom CGM to check BG continuously - pt will need reader device  as phone is not compatible. -Referral to endocrinology today. -Patient educated on purpose, proper use, and potential adverse effects of Lantus, Humalog, and [place].  -Extensively discussed pathophysiology of diabetes, recommended lifestyle interventions, dietary effects on blood sugar control.  -Counseled on  s/sx of and management of hypoglycemia.  -Next A1c anticipated 09/02/23.   Written patient instructions provided. Patient verbalized understanding of treatment plan.  Total time in face to face counseling 30 minutes.    Follow-up:  Pharmacist in 1 month  Jarrett Ables, PharmD PGY-1 Pharmacy Resident

## 2023-06-05 ENCOUNTER — Other Ambulatory Visit: Payer: Self-pay

## 2023-06-06 ENCOUNTER — Other Ambulatory Visit: Payer: Self-pay

## 2023-06-14 ENCOUNTER — Other Ambulatory Visit: Payer: Self-pay

## 2023-06-15 ENCOUNTER — Ambulatory Visit (HOSPITAL_COMMUNITY): Payer: Medicaid Other | Attending: Student

## 2023-06-15 ENCOUNTER — Other Ambulatory Visit: Payer: Self-pay

## 2023-06-15 DIAGNOSIS — I739 Peripheral vascular disease, unspecified: Secondary | ICD-10-CM

## 2023-06-15 DIAGNOSIS — I5042 Chronic combined systolic (congestive) and diastolic (congestive) heart failure: Secondary | ICD-10-CM

## 2023-06-15 LAB — ECHOCARDIOGRAM COMPLETE
Area-P 1/2: 2.76 cm2
S' Lateral: 3.4 cm

## 2023-07-09 ENCOUNTER — Ambulatory Visit: Payer: Medicaid Other | Admitting: Pharmacist

## 2023-07-09 NOTE — Progress Notes (Unsigned)
S:     No chief complaint on file.  57 y.o. male who presents for diabetes evaluation, education, and management.    Patient was last seen by Primary Care Provider, Dr. [name], on 11/08/22. Patient was seen and referred by Gwinda Passe on 10/26.   PMH is significant for type 2 diabetes, CHF, PAD, CAD, HTN, alcohol induced chronic pancreatitis, tobacco use disorder.   At last visit with [facility] on 10/26, his A1c was 9.5%. His Humalog dose was increased from 5 to 8 units three times a day. Other medications were continued.   Last pharmacist visit on 10/28 at which time no changes were made since he had only just increased Humalog 2 days prior and was not checking BG at home. We did set him up with Dexcom CGM.      Today patient presents to clinic in okay spirits. Reports skin rash and itching on his arms, back, and chest. Notes it feels like his skin is crawling. Patient reported Marcelino Duster instructed him to take Benadryl at visit a couple days ago, but this did not help at all. Wonders if skin issues could be related to gabapentin, but reports sx started before he took gabapentin and have continued despite discontinuing gabapentin. Reports he is taking Lantus, Humalog, and Farxiga as prescribed. Denies missed doses. Not checking BG at home, but reports he plans to start today.  Family/Social History:  Mother- diabetes  Current diabetes medications include: Lantus 25 units daily, Humalog 8 units TID (hold if BG<150), Farxiga 10 mg daily, Current hyperlipidemia medications include: rosuvastatin 40 mg daily  Patient reports adherence to taking all medications as prescribed.   Insurance coverage: [place] Medicaid  Patient denies hypoglycemic events.  Reported home fasting blood sugars: not checking  Reported 2 hour post-meal/random blood sugars: not checking  Patient denies nocturia (nighttime urination).  Patient denies neuropathy (nerve pain). Patient denies visual  changes. Patient reports self foot exams.   Patient-reported exercise habits: limited by mobility issues   O:   ROS  Physical Exam  Lab Results  Component Value Date   HGBA1C 9.5 (A) 06/02/2023   There were no vitals filed for this visit.  Lipid Panel     Component Value Date/Time   CHOL 98 (L) 11/08/2022 1352   TRIG 91 11/08/2022 1352   HDL 43 11/08/2022 1352   CHOLHDL 2.3 11/08/2022 1352   CHOLHDL 3.7 01/01/2020 0550   VLDL 20 01/01/2020 0550   LDLCALC 37 11/08/2022 1352    Clinical Atherosclerotic Cardiovascular Disease (ASCVD): Yes  The ASCVD Risk score (Arnett DK, et al., 2019) failed to calculate for the following reasons:   The valid total cholesterol range is 130 to 320 mg/dL   Patient is participating in a Managed Medicaid Plan: No  A/P: Diabetes longstanding currently uncontrolled based on last A1c 9.5%. Patient is able to verbalize appropriate hypoglycemia management plan. Medication adherence appears appropriate. Control is suboptimal due to need for additional therapy. Patient just increased Humalog 2 days ago after visit with Marcelino Duster on 10/26. Has not been checking BG so unable to assess control. Will not make any medication adjustments today. Patient would benefit from a CGM which will help [place] assess his BG control and allow for easier medication adjustments. Discussed referral to endocrinology as his diabetes is primarily managed with basal/bolus insulin and his low body weight. He was amenable to this. Recommended he follow back up with Marcelino Duster for skin itching and rash to let her know Benadryl  did not help. -Continued basal insulin Lantus 25 units daily.  -Continued rapid insulin Humalog (insulin lispro) 8 units three times a day with meals. -Continued SGLT2-I Farxiga (dapagliflozin) 10 mg daily. -Start Dexcom CGM to check BG continuously - pt will need reader device as phone is not compatible. -Referral to endocrinology today. -Patient educated on  purpose, proper use, and potential adverse effects of Lantus, Humalog, and [place].  -Extensively discussed pathophysiology of diabetes, recommended lifestyle interventions, dietary effects on blood sugar control.  -Counseled on s/sx of and management of hypoglycemia.  -Next A1c anticipated 09/02/23.   Written patient instructions provided. Patient verbalized understanding of treatment plan.  Total time in face to face counseling 30 minutes.    Follow-up:  Pharmacist in 1 month  Jarrett Ables, PharmD PGY-1 Pharmacy Resident

## 2023-07-20 ENCOUNTER — Other Ambulatory Visit: Payer: Self-pay

## 2023-07-31 ENCOUNTER — Other Ambulatory Visit: Payer: Self-pay

## 2023-08-15 ENCOUNTER — Other Ambulatory Visit: Payer: Self-pay

## 2023-08-16 ENCOUNTER — Other Ambulatory Visit: Payer: Self-pay

## 2023-08-17 ENCOUNTER — Other Ambulatory Visit: Payer: Self-pay

## 2023-08-20 ENCOUNTER — Other Ambulatory Visit: Payer: Self-pay

## 2023-08-22 ENCOUNTER — Other Ambulatory Visit: Payer: Self-pay

## 2023-10-23 ENCOUNTER — Other Ambulatory Visit: Payer: Self-pay

## 2023-10-26 ENCOUNTER — Other Ambulatory Visit: Payer: Self-pay

## 2023-11-14 NOTE — Progress Notes (Deleted)
 Established Patient Office Visit  Subjective   Patient ID: Rodney Barber, male    DOB: 1966/02/21  Age: 58 y.o. MRN: [id number]  No chief complaint on file.   09/2021 Rodney Barber presents for primary care follow-up.  Patient is currently at the [place] but is about to get housing and now has full Medicaid and Social Security disability.  Patient on arrival has blood pressure 110/54 blood sugar of 96.  He is taking 25 units of insulin glargine in the morning and variably takes 5 units of Humalog lispro in the morning and occasionally a dose at lunch.  He states his blood sugars are in the low 100s fasting midmorning 175 range and in the late afternoon he does get over 200 on occasion.  On arrival hemoglobin A1c is 9.1 which is still markedly better than previous values of greater than 14 in the past.  He has no other real complaints at this visit.   The patient needs screening labs at this visit he is still smoking some cigarettes.   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  Patient needs refills on iron carvedilol and Creon  12/27   On arrival blood pressure is good 117/62 blood sugar 138 he states blood sugars at home anywhere from 1 60-1 80.  Patient is on 25 units of Lantus daily and takes the 5 units twice daily of Humalog  Patient now has housing and is awaiting his furniture that arrives this week.  He has no other complaints.  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 He is on Creon pancreatic enzyme replacement.  At the previous visit, he complained of  left claudication. ABI was abnormal. He was referred to vascular surgery and has an upcoming appointment scheduled 03/30/22.   At the previous visit, we discontinued iron supplement as his levels were normalized. He reports he thinks he is still taking them.    Today's Blood pressure is 175/80. Blood sugar = 356. He did not take his medications today as he was rushing to get out the door. Normally he is compliant with his medications. He does take his sugars at home and reports they normally range from 160's-180's. He does not take blood pressures at home as he does not have a machine. He is interested in obtaining one.   Reports some light headed feelings occasionally in the morning. This is normally alleviated with a meal. He does believe he has lost about 10 pounds in the past 3 months   He is also requesting a referral to opthalmology, reports he hasn't seen them in years. Was previously diagnosed with cataracts   Lastly, he previously was following with urology regarding a left kidney stone. He has not seen them yet and is requesting another referral.   He was able to obtain furniture for his apartment! Although he does report a "nat" issue. Reports some itching of his forearms and neck intermittently. Also notices a rash at the same time in those areas. He believes this may be eczema related.    He is still smoking cigarettes, reports about 3 daily. No alcohol or recreational drug use.  08/02/22 Patient seen in return follow-up for diabetes cardiovascular disease hypertension On arrival blood pressure is slightly elevated and patient is compliant with all medication.  Blood sugars largely improved he has occasional elevations in the afternoon.  He does have dental care.  He did except a flu vaccine at this visit.  He has had no episodes of hypoglycemia.  He is getting his pill organization done by one of the [employer] nurses at [facility].  There is no new  complaints  11/08/22 Patient seen today in return follow-up he is now in an independent living trying to manage his lifestyle he is now filling his own pill organizer taking his insulin.  His blood sugar tends to run in the 140s in the afternoon but much higher in the mornings.  On arrival his A1c remains elevated 8.9 and diabetes is uncontrolled.  He is not drinking alcohol he is smoking 3 cigarettes a day.  He is yet to see cardiology recently.  Blood pressure on arrival is good 135/60.  He needs follow-up labs and refill medications.  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   Anemia    Diabetes mellitus    Hypertension    Pancreatitis    Substance use disorder 02/18/2019   Past Surgical History:  Procedure Laterality Date   ERCP  06/27/2011   Procedure: ENDOSCOPIC RETROGRADE CHOLANGIOPANCREATOGRAPHY (ERCP);  Surgeon: Petra Kuba, MD;  Location: [facility];  Service: Endoscopy;  Laterality: N/A;   ERCP W/ PLASTIC STENT PLACEMENT  03/2011   IR US GUIDE BX ASP/DRAIN  05/16/2019   LEFT HEART CATH AND CORONARY ANGIOGRAPHY N/A 01/02/2020   Procedure: LEFT HEART CATH AND CORONARY ANGIOGRAPHY;  Surgeon: Swaziland, Peter M, MD;  Location: [facility];  Service: Cardiovascular;  Laterality: N/A;   TEE WITHOUT CARDIOVERSION N/A 05/20/2019   Procedure: TRANSESOPHAGEAL ECHOCARDIOGRAM (TEE);  Surgeon: Chrystie Nose, MD;  Location: [facility] ENDOSCOPY;  Service: Cardiovascular;  Laterality: N/A;   Social History   Tobacco Use   Smoking status: Every Day    Current packs/day: 0.50    Average packs/day: 0.5 packs/day for 30.0 years (15.0 ttl pk-yrs)    Types: Cigarettes   Smokeless tobacco: Never   Tobacco comments:    6 cigarett /day  Vaping Use   Vaping status: Never Used  Substance Use Topics   Alcohol use: No   Drug use: Not Currently    Types: "Crack" cocaine, Other-see comments, Cocaine    Comment: last smoked crack 12-16-16   Family History  Problem Relation Age of Onset   Diabetes Mother    Diabetes Brother    No Known Allergies    Review of Systems  Constitutional:  Negative for chills, diaphoresis, fever, malaise/fatigue and weight loss.  HENT: Negative.  Negative for congestion, hearing loss, nosebleeds, sore throat and tinnitus.   Eyes: Negative.  Negative for blurred vision, photophobia and redness.  Respiratory: Negative.  Negative for cough, hemoptysis, sputum production, shortness of breath, wheezing and stridor.   Cardiovascular:  Negative for chest pain, palpitations, orthopnea, claudication, leg swelling and PND.  Gastrointestinal: Negative.  Negative for abdominal pain, blood in stool, constipation, diarrhea, heartburn, nausea and vomiting.  Genitourinary: Negative.  Negative for dysuria, flank pain, frequency, hematuria and urgency.  Musculoskeletal:  Negative for back pain, falls, joint pain, myalgias and neck pain.  Skin:  Negative for itching and rash.  Neurological:  Negative for dizziness, tingling, tremors, sensory change, speech change, focal weakness, seizures, loss of consciousness, weakness and headaches.  Endo/Heme/Allergies: Negative.  Negative for environmental allergies and polydipsia. Does not bruise/bleed easily.  Psychiatric/Behavioral: Negative.  Negative for depression, memory loss, substance abuse and suicidal ideas. The patient is not  nervous/anxious and does not have insomnia.       Objective:     There were no vitals taken for this visit.    Physical Exam HENT:     Head: Normocephalic.     Right Ear: Tympanic membrane, ear canal and external ear normal.     Left Ear: Tympanic membrane, ear canal and external ear normal.     Nose: Nose normal.     Mouth/Throat:     Mouth: Mucous membranes are moist.     Pharynx: Oropharynx is clear.     Comments: Poor dentition seeking dental care Eyes:     Conjunctiva/sclera: Conjunctivae normal.  Cardiovascular:     Rate and Rhythm: Normal rate and regular rhythm.     Pulses: Normal pulses.     Heart sounds: Normal heart sounds.  Pulmonary:     Effort: Pulmonary effort is normal.     Breath sounds: Normal breath sounds.  Abdominal:     General: Bowel sounds are normal.     Palpations: Abdomen is soft.  Musculoskeletal:     Comments: Foot exam normal  Skin:    General: Skin is warm.  Neurological:     Mental Status: He is alert and oriented to person, place, and time. Mental status is at baseline.     Comments: Ambulates with cane   Psychiatric:        Mood and Affect: Mood normal.        Behavior: Behavior normal.      No results found for any visits on 11/15/23.      The ASCVD Risk score (Arnett DK, et al., 2019) failed to calculate for the following reasons:   The valid total cholesterol range is 130 to 320 mg/dL    Assessment & Plan:   Problem List Items Addressed This Visit   None  No follow-ups on file.  30 minutes spent extra time needed for complexity decision making  Shan Levans, MD

## 2023-11-15 ENCOUNTER — Encounter: Admitting: Critical Care Medicine

## 2023-11-19 ENCOUNTER — Ambulatory Visit (HOSPITAL_COMMUNITY): Admission: RE | Admit: 2023-11-19 | Payer: Medicaid Other | Source: Ambulatory Visit

## 2023-11-19 ENCOUNTER — Ambulatory Visit: Payer: Medicaid Other

## 2023-12-06 DEATH — deceased
